# Patient Record
Sex: Male | Born: 1965 | Race: Black or African American | Hispanic: No | Marital: Married | Smoking: Former smoker
Health system: Southern US, Community
[De-identification: ages and names within clinical notes are randomized; demographics above are authoritative.]

## PROBLEM LIST (undated history)

## (undated) DIAGNOSIS — T8859XA Other complications of anesthesia, initial encounter: Secondary | ICD-10-CM

## (undated) DIAGNOSIS — N179 Acute kidney failure, unspecified: Secondary | ICD-10-CM

## (undated) DIAGNOSIS — I2510 Atherosclerotic heart disease of native coronary artery without angina pectoris: Secondary | ICD-10-CM

## (undated) DIAGNOSIS — T8189XA Other complications of procedures, not elsewhere classified, initial encounter: Secondary | ICD-10-CM

## (undated) DIAGNOSIS — I10 Essential (primary) hypertension: Secondary | ICD-10-CM

## (undated) DIAGNOSIS — E162 Hypoglycemia, unspecified: Secondary | ICD-10-CM

## (undated) DIAGNOSIS — K219 Gastro-esophageal reflux disease without esophagitis: Secondary | ICD-10-CM

## (undated) DIAGNOSIS — E039 Hypothyroidism, unspecified: Secondary | ICD-10-CM

## (undated) DIAGNOSIS — F419 Anxiety disorder, unspecified: Secondary | ICD-10-CM

## (undated) DIAGNOSIS — R300 Dysuria: Secondary | ICD-10-CM

## (undated) DIAGNOSIS — F32A Depression, unspecified: Secondary | ICD-10-CM

## (undated) DIAGNOSIS — D649 Anemia, unspecified: Secondary | ICD-10-CM

## (undated) DIAGNOSIS — Z9221 Personal history of antineoplastic chemotherapy: Secondary | ICD-10-CM

## (undated) DIAGNOSIS — Z931 Gastrostomy status: Secondary | ICD-10-CM

## (undated) DIAGNOSIS — R509 Fever, unspecified: Secondary | ICD-10-CM

## (undated) DIAGNOSIS — G8929 Other chronic pain: Secondary | ICD-10-CM

## (undated) DIAGNOSIS — R600 Localized edema: Secondary | ICD-10-CM

## (undated) DIAGNOSIS — E46 Unspecified protein-calorie malnutrition: Secondary | ICD-10-CM

## (undated) DIAGNOSIS — S060XAA Concussion with loss of consciousness status unknown, initial encounter: Secondary | ICD-10-CM

## (undated) DIAGNOSIS — R112 Nausea with vomiting, unspecified: Secondary | ICD-10-CM

## (undated) DIAGNOSIS — Z923 Personal history of irradiation: Secondary | ICD-10-CM

## (undated) DIAGNOSIS — G4700 Insomnia, unspecified: Secondary | ICD-10-CM

## (undated) DIAGNOSIS — K5900 Constipation, unspecified: Secondary | ICD-10-CM

## (undated) DIAGNOSIS — J392 Other diseases of pharynx: Secondary | ICD-10-CM

## (undated) DIAGNOSIS — C099 Malignant neoplasm of tonsil, unspecified: Secondary | ICD-10-CM

## (undated) DIAGNOSIS — R945 Abnormal results of liver function studies: Secondary | ICD-10-CM

## (undated) DIAGNOSIS — S060X9A Concussion with loss of consciousness of unspecified duration, initial encounter: Secondary | ICD-10-CM

## (undated) DIAGNOSIS — T4145XA Adverse effect of unspecified anesthetic, initial encounter: Secondary | ICD-10-CM

## (undated) DIAGNOSIS — M1990 Unspecified osteoarthritis, unspecified site: Secondary | ICD-10-CM

## (undated) DIAGNOSIS — J019 Acute sinusitis, unspecified: Secondary | ICD-10-CM

## (undated) DIAGNOSIS — R7989 Other specified abnormal findings of blood chemistry: Secondary | ICD-10-CM

## (undated) DIAGNOSIS — E876 Hypokalemia: Secondary | ICD-10-CM

## (undated) DIAGNOSIS — M25569 Pain in unspecified knee: Secondary | ICD-10-CM

## (undated) HISTORY — DX: Insomnia, unspecified: G47.00

## (undated) HISTORY — DX: Acute sinusitis, unspecified: J01.90

## (undated) HISTORY — DX: Other complications of procedures, not elsewhere classified, initial encounter: T81.89XA

## (undated) HISTORY — DX: Dysuria: R30.0

---

## 2013-02-18 ENCOUNTER — Encounter (HOSPITAL_COMMUNITY): Payer: Self-pay | Admitting: Emergency Medicine

## 2013-02-18 ENCOUNTER — Emergency Department (INDEPENDENT_AMBULATORY_CARE_PROVIDER_SITE_OTHER)
Admission: EM | Admit: 2013-02-18 | Discharge: 2013-02-18 | Disposition: A | Discharge status: Home / Self Care | Payer: Medicaid Other | Source: Home / Self Care | Attending: Family Medicine | Admitting: Family Medicine

## 2013-02-18 DIAGNOSIS — S83206A Unspecified tear of unspecified meniscus, current injury, right knee, initial encounter: Secondary | ICD-10-CM

## 2013-02-18 DIAGNOSIS — IMO0002 Reserved for concepts with insufficient information to code with codable children: Secondary | ICD-10-CM

## 2013-02-18 MED ORDER — METHYLPREDNISOLONE ACETATE 40 MG/ML IJ SUSP
INTRAMUSCULAR | Status: AC
  Filled 2013-02-18: qty 5

## 2013-02-18 MED ORDER — METHYLPREDNISOLONE ACETATE 40 MG/ML IJ SUSP
40.0000 mg | Freq: Once | INTRAMUSCULAR | Status: AC
  Administered 2013-02-18: 40 mg via INTRA_ARTICULAR

## 2013-02-18 NOTE — ED Provider Notes (Signed)
CSN: [id number]     Arrival date & time 02/18/13  1250 History   First MD Initiated Contact with Patient 02/18/13 1400     Chief Complaint  Patient presents with  . Knee Pain   (Consider location/radiation/quality/duration/timing/severity/associated sxs/prior Treatment) HPI Comments: Pt was playing football when R knee twisted. Has been causing problems for pt on and off for 2.5 years, worsening. Now hurts all the time.   Patient is a 47 y.o. male presenting with knee pain. The history is provided by the patient.  Knee Pain Location:  Knee Time since incident: 2.5 years. Injury: yes   Knee location:  R knee Pain details:    Radiates to:  Does not radiate   Severity:  Severe   Onset quality:  Sudden   Duration: 2.5 years.   Timing:  Intermittent   Progression:  Worsening Chronicity:  Chronic Dislocation: no   Relieved by:  Nothing Worsened by:  Bearing weight Ineffective treatments:  NSAIDs Associated symptoms: stiffness and swelling   Associated symptoms: no fever     History reviewed. No pertinent past medical history. History reviewed. No pertinent past surgical history. History reviewed. No pertinent family history. History  Substance Use Topics  . Smoking status: Never Smoker   . Smokeless tobacco: Not on file  . Alcohol Use: No    Review of Systems  Constitutional: Negative for fever and chills.  Musculoskeletal: Positive for joint swelling and stiffness.       R knee pain    Allergies  Review of patient's allergies indicates no known allergies.  Home Medications  No current outpatient prescriptions on file. BP 139/85  Pulse 83  Temp(Src) 97.8 F (36.6 C) (Oral)  Resp 16  SpO2 99% Physical Exam  Constitutional: He appears well-developed and well-nourished. No distress.  Musculoskeletal:       Right knee: He exhibits swelling and abnormal meniscus. He exhibits normal range of motion. Tenderness found. Lateral joint line tenderness noted.    ED  Course  Injection of joint Date/Time: 02/18/2013 1:45 PM Performed by: Clementeen Graham, S Authorized by: Clementeen Graham, S Consent: Verbal consent obtained. Risks and benefits: risks, benefits and alternatives were discussed Consent given by: patient Patient understanding: patient states understanding of the procedure being performed Patient identity confirmed: verbally with patient and arm band Local anesthesia used: yes Local anesthetic: bupivacaine 0.5% without epinephrine Anesthetic total: 2 ml Patient sedated: no Comments: R knee injected with 40mg  depo-medrol and 2ml bupivicaine. Pt tolerated well.    (including critical care time) Labs Review Labs Reviewed - No data to display Imaging Review No results found.  EKG Interpretation     Ventricular Rate:    PR Interval:    QRS Duration:   QT Interval:    QTC Calculation:   R Axis:     Text Interpretation:              MDM   1. Meniscus tear, right, initial encounter   Pt examined by Dr. Denyse Amass, positive McMurray's test. Joint injected by Dr. Denyse Amass.  [facility] to [facility]. Encouarged to get orange card [facility] network) and sign up for health insurance.     Cathlyn Parsons, NP 02/18/13 1408

## 2013-02-18 NOTE — ED Notes (Signed)
Reported pain in right knee sine 9-1; had problems since HS football; denies new injury, but has been on his feet a lot recently

## 2013-02-18 NOTE — ED Provider Notes (Signed)
Knee injection. Right Consent obtained and timeout performed. Patient seated in a comfortable position with legs hanging off the table.  The medial Peri-patellar tendon space was palpated and marked. The skin was then cleaned with Alcohol. Cold spray applied. A 25-gauge inch and a half needle was used to inject 40 mg of Depo-Medrol and 4 mL of 0.5% Marcaine. Patient tolerated procedure well no bleeding. Pain improved following injection   Medical screening examination/treatment/procedure(s) were performed by a resident physician or non-physician practitioner and as the supervising physician I was immediately available for consultation/collaboration.  Clementeen Graham, MD   Rodolph Bong, MD 02/18/13 [phone]

## 2013-05-14 ENCOUNTER — Ambulatory Visit: Payer: Medicaid Other | Attending: Internal Medicine

## 2013-06-10 ENCOUNTER — Ambulatory Visit: Payer: Medicaid Other | Attending: Internal Medicine

## 2013-07-09 ENCOUNTER — Other Ambulatory Visit: Payer: Self-pay | Admitting: Otolaryngology

## 2013-07-09 ENCOUNTER — Other Ambulatory Visit (HOSPITAL_COMMUNITY)
Admission: RE | Admit: 2013-07-09 | Discharge: 2013-07-09 | Disposition: A | Discharge status: Home / Self Care | Payer: Medicaid Other | Source: Ambulatory Visit | Attending: Otolaryngology | Admitting: Otolaryngology

## 2013-07-09 ENCOUNTER — Encounter (HOSPITAL_COMMUNITY): Payer: Self-pay | Admitting: Emergency Medicine

## 2013-07-09 ENCOUNTER — Emergency Department (INDEPENDENT_AMBULATORY_CARE_PROVIDER_SITE_OTHER)
Admission: EM | Admit: 2013-07-09 | Discharge: 2013-07-09 | Disposition: A | Discharge status: Home / Self Care | Payer: Medicaid Other | Source: Home / Self Care | Attending: Emergency Medicine | Admitting: Emergency Medicine

## 2013-07-09 DIAGNOSIS — R221 Localized swelling, mass and lump, neck: Principal | ICD-10-CM

## 2013-07-09 DIAGNOSIS — M25569 Pain in unspecified knee: Secondary | ICD-10-CM

## 2013-07-09 DIAGNOSIS — R220 Localized swelling, mass and lump, head: Secondary | ICD-10-CM

## 2013-07-09 DIAGNOSIS — C099 Malignant neoplasm of tonsil, unspecified: Secondary | ICD-10-CM

## 2013-07-09 DIAGNOSIS — J392 Other diseases of pharynx: Secondary | ICD-10-CM

## 2013-07-09 HISTORY — DX: Malignant neoplasm of tonsil, unspecified: C09.9

## 2013-07-09 MED ORDER — MELOXICAM 15 MG PO TABS: 15.0000 mg | ORAL_TABLET | Freq: Every day | ORAL | Status: DC

## 2013-07-09 MED ORDER — HYDROCODONE-ACETAMINOPHEN 5-325 MG PO TABS: ORAL_TABLET | ORAL | Status: DC

## 2013-07-09 NOTE — ED Provider Notes (Signed)
Chief Complaint   Chief Complaint  Patient presents with  . Sore Throat  . Knee Pain    History of Present Illness   Mason [name] is a 48 year old male who presents today with knee pain and sore throat.  1. Knee pain: This is been worse for the past month, however he's had pain in that knee for over a year. He thinks he injured her knee playing basketball. He was seen here last November. He was told he had a medial collateral ligament tear. He's been out of work because of the knee pain. He was given a cortisone shot when he was here and this didn't help at all. The pain is located laterally. He has crepitus and giving way of the knee. No locking.  2. Sore throat: He has had a sore throat for the past 5 weeks. He went to an emergency room in [place] about 10 days ago and was diagnosed with tonsillitis. He was prescribed Augmentin and prednisone but is not feeling any better. It feels like his throat is closing up. He's had subjective fever, chills, nasal congestion, headache, and cough.  Review of Systems   Other than as noted above, the patient denies any of the following symptoms: Systemic:  No fevers or chills. Musculoskeletal:  No arthritis, swelling, back pain, or neck pain.  Neurological:  No muscular weakness or paresthesias.  [place]   Past medical history, family history, social history, meds, and allergies were reviewed.     Physical Examination   Vital signs:  BP 158/103  Pulse 84  Temp(Src) 98.3 F (36.8 C) (Oral)  Resp 16  SpO2 96% Gen:  Alert and oriented times 3.  In no distress. ENT: TMs are normal. Exam of the throat reveals a large posterior pharyngeal, fungating mass with white exudate. This is definitely not tonsillitis I'm concerned about carcinoma or lymphoma. There was no cervical lymphadenopathy. Lungs: Clear to auscultation. Heart: Regular rhythm, no gallop or murmur. Musculoskeletal:  There was no fluid or swelling present. The knee was tender  to palpation both medially and laterally. The knee has a limited range of motion with crepitus and pain.  McMurray's test was negative.  Lachman's test was negative.  Anterior drawer test was negative.   Varus and valgus stress were both painful.  Otherwise, all joints had a full a ROM with no swelling, bruising or deformity.  No edema, pulses full. Extremities were warm and pink.  Capillary refill was brisk.  Skin:  Clear, warm and dry.  No rash. Neuro:  Alert and oriented times 3.  Muscle strength was normal.  Sensation was intact to light touch.   Assessment   The primary encounter diagnosis was Throat mass. A diagnosis of Knee pain was also pertinent to this visit.  His biggest concern right now should be the sore throat and mass in the throat. I called Dr. Alroy Dust for and he will see him immediately. The patient was sent right over to his office to followup there. Once the throat is taking care of he'll need to address the knee. He was given the name of Dr. Fredonia Highland to followup with for this. He will probably need an MRI of the knee followed by arthroscopic surgery.  Plan    1.  Meds:  The following meds were prescribed:   Discharge Medication List as of 07/09/2013 12:47 PM    START taking these medications   Details  HYDROcodone-acetaminophen (NORCO/VICODIN) 5-325 MG per tablet 1 to 2  tabs every 4 to 6 hours as needed for pain., Print    meloxicam (MOBIC) 15 MG tablet Take 1 tablet (15 mg total) by mouth daily., Starting 07/09/2013, Until Discontinued, Normal        2.  Patient Education/Counseling:  The patient was given appropriate handouts, self care instructions, and instructed in symptomatic relief, including rest and activity, elevation, application of ice and compression.    3.  Follow up:  The patient was told to follow up here if no better in 3 to 4 days, or sooner if becoming worse in any way, and given some red flag symptoms such as worsening pain or neurological symptoms  which would prompt immediate return.       Harden Mo, MD 07/09/13 [phone]

## 2013-07-09 NOTE — ED Notes (Signed)
Patient complains of right knee pain and can't walk at times; also states sore throat x 1 month.

## 2013-07-09 NOTE — Discharge Instructions (Signed)
Knee pain can be caused by many conditions:  Osteoarthritis, gout, bursitis, tendonitis, cartiledge damage, condromalacia patella, patellofemoral syndrome, and ligament sprain to name just a few.  Often some simple conservative measures can help alleviate the pain. ° °Do not do the following: °· Avoid squatting and doing deep knee bends.  This puts too much of load on your cartiledges and tendons.  If you do a knee bend, go only half way down, flexing your knee no more than 90 degrees. ° °Do the following: °· If you are overweight or obese, lose weight.  This makes for a lot less load on your knee joints. °· If you use tobacco, quit.  Nicotine causes spasm of the small arteries, decreases blood flow, and impairs your body's normal ability to repair damage. °· If your knee is acutely inflamed, use the principles of RICE (rest, ice, compression, and elevation). °· Wearing a knee brace can help.  These are usually made of neoprene and can be purchased over the counter at the drug store. °· Use of over the counter pain meds can be of help.  Tylenol (or acetaminophen) is the safest to use.  It often helps to take this regularly.  You can take up to 2 325 mg tablets 5 times daily, but it best to start out much lower that that, perhaps 2 325 mg tablets twice daily, then increase from there. People who are on the blood thinner warfarin have to be careful about taking high doses of Tylenol.  For people who are able to tolerate them, ibuprofen and naproxyn can also help with the pain.  You should discuss these agents with your physician before taking them.  People with chronic kidney disease, hypertension, peptic ulcer disease, and reflux can suffer adverse side effects. They should not be taken with warfarin. The maximum dosage of ibuprofen is 800 mg 3 times daily with meals.  The maximum dosage of naprosyn is 2 and 1/2 tablets twice daily with food, but again, start out low and gradually increase the dose until adequate  pain relief is achieved. Ibuprofen and naprosyn should always be taken with food. °· People with cartiledge injury or osteoarthritis may find glucosamine to be helpful.  This is an over-the-counter supplement that helps nourish and repair cartiledge.  The dose is 500 mg 3 times daily or 1500 mg taken in a single dose. This can take several months to work and it doesn't always work.   °· For people with knee pain on just one side, use of a cane held in the hand on the same side as the knee pain takes some of the stress off the knee joint and can make a big difference in knee pain. °· Wearing good shoes with adequate arch support is essential. °· Regular exercise is of utmost importance.  Swimming, water aerobics, or use of an elliptical exerciser put the least stress on the knees of any exercise. °· Finally doing the exercises below can be very helpful.  They tend to strengthen the muscles around the knee and provide extra support and stability.  Try to do them twice a day followed by ice for 10 minutes. ° ° ° ° ° ° °Blood pressure over the ideal can put you at higher risk for stroke, heart disease, and kidney failure.  For this reason, it's important to try to get your blood pressure as close as possible to the ideal. ° °The ideal blood pressure is 120/80.  Blood pressures from 120-139 systolic   over 80-89 diastolic are labeled as "prehypertension."  This means you are at higher risk of developing hypertension in the future.  Blood pressures in this range are not treated with medication, but lifestyle changes are recommended to prevent progression to hypertension.  Blood pressures of 140 and above systolic over 90 and above diastolic are classified as hypertension and are treated with medications. ° °Lifestyle changes which can benefit both prehypertension and hypertension include the following: ° °· Salt and sodium restriction. °· Weight loss. °· Regular exercise. °· Avoidance of tobacco. °· Avoidance of excess  alcohol. °· The "D.A.S.H" diet. ° °· People with hypertension and prehypertension should limit their salt intake to less than 1500 mg daily.  Reading the nutrition information on the label of many prepared foods can give you an idea of how much sodium you're consuming at each meal.  Remember that the most important number on the nutrition information is the serving size.  It may be smaller than you think.  Try to avoid adding extra salt at the table.  You may add small amounts of salt while cooking.  Remember that salt is an acquired taste and you may get used to a using a whole lot less salt than you are using now.  Using less salt lets the food's natural flavors come through.  You might want to consider using salt substitutes, potassium chloride, pepper, or blends of herbs and spices to enhance the flavor of your food.  Foods that contain the most salt include: processed meats (like ham, bacon, lunch meat, sausage, hot dogs, and breakfast meat), chips, pretzels, salted nuts, soups, salty snacks, canned foods, junk food, fast food, restaurant food, mustard, pickles, pizza, popcorn, soy sauce, and worcestershire sauce--quite a list!  You might ask, "Is there anything I can eat?"  The answer is, "yes."  Fruits and vegetables are usually low in salt.  Fresh is better than frozen which is better than canned.  If you have canned vegetables, you can cut down on the salt content by rinsing them in tap water 3 times before cooking.   ° °· Weight loss is the second thing you can do to lower your blood pressure.  Getting to and maintaining ideal weight will often normalize your blood pressure and allow you to avoid medications, entirely, cut way down on your dosage of medications, or allow to wean off your meds.  (Note, this should only be done under the supervision of your primary care doctor.)  Of course, weight loss takes time and you may need to be on medication in the meantime.  You shoot for a body mass index of 20-25.   When you go to the urgent care or to your primary care doctor, they should calculate your BMI.  If you don't know what it is, ask.  You can calculate your BMI with the following formula:  Weight in pounds x 703/ (height in inches) x (height in inches).  There are many good diets out there: Weight Watchers and the D.A.S.H. Diet are the best, but often, just modifying a few factors can be helpful:  Don't skip meals, don't eat out, and keeping a food diary.  I do not recommend fad diets or diet pills which often raise blood pressure.  ° °· Everyone should get regular exercise, but this is particularly important for people with high blood pressure.  Just about any exercise is good.  The only exercise which may be harmful is lifting extreme heavy weights.    I recommend moderate exercise such as walking for 30 minutes 5 days a week.  Going to the gym for a 50 minute workout 3 times a week is also good.  This amounts to 150 minutes of exercise weekly. ° °· Anyone with high blood pressure should avoid any use of tobacco.  Tobacco use does not elevate blood pressure, but it increases the risk of heart disease and stroke.  If you are interested in quitting, discuss with your doctor how to quit.  If you are not interested in quitting, ask yourself, "What would my life be like in 10 years if I continue to smoke?"  "How will I know when it is time to quit?"  "How would my life be better if I were to quit." ° °· Excess alcohol intake can raise the blood pressure.  The safe alcohol intake is 2 drinks or less per day for men and 1 drink per day or less for women. ° °· There is a very good diet which I recommend that has been designed for people with blood pressure called the D.A.S.H. Diet (dietary approaches to stop hypertension).  It consists of fruits, vegetables, lean meats, low fat dairy, whole grains, nuts and seeds.  It is very low in salt and sodium.  It has also been found to have other beneficial health effects such as  lowering cholesterol and helping lose weight.  It has been developed by the National Institutes of Health and can be downloaded from the internet without any cost. Just do a web search on "D.A.S.H. Diet." or go the NIH website (www.nih.gov).  There are also cookbooks and diet plans that can be gotten from [employer] to help you with this diet. ° ° °

## 2013-07-15 ENCOUNTER — Telehealth: Payer: Self-pay | Admitting: Hematology and Oncology

## 2013-07-15 ENCOUNTER — Telehealth: Payer: Self-pay | Admitting: *Deleted

## 2013-07-15 ENCOUNTER — Encounter: Payer: Self-pay | Admitting: Radiation Oncology

## 2013-07-15 NOTE — Telephone Encounter (Signed)
C/D 07/15/13 for appt. 07/16/13 °

## 2013-07-15 NOTE — Telephone Encounter (Signed)
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 He verbalized understanding and appreciation for my call.  Gayleen Orem, RN, BSN, [facility] Head [facility] Neck Oncology Navigator [phone]

## 2013-07-15 NOTE — Progress Notes (Signed)
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 Upon assessment Dr. [name] noted a "fungating ,irregular mass emanating from the right tonsil and growing up onto the right soft palate/uvula and noted palpable adenopathy in the right Level II and Level III neck areas.       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  No definite areas of invasive disease are identified.  Nutrition Status:  Weight changes: no  Swallowing status: Sore Throat  Plans, if any, for PEG tube: no  Tobacco/Marijuana/Snuff/ETOH use: Stopped smoking ~15 years, Social drinking  Past/Anticipated interventions by otolaryngology, if any: Biopsy of right Tonsil  Past/Anticipated interventions by medical oncology, if any: Appointment with Dr. Alvy Bimler on 07/16/13 @ 11:15am  Referrals yet, to any of the following?   Social Work? Yes, put in order today  Dentistry? no  Swallowing therapy? no  Nutrition? no  Med/Onc? Dr. Alvy Bimler today  PEG placement?   SAFETY ISSUES:  Prior radiation? No  Pacemaker/ICD? No  Possible current pregnancy? N/A  Is the patient on methotrexate? No  Current Complaints / other details:  Single, 1 child, paternal grandfather  Unknown cancer and heart attack July 26, 2013 [facility] aptt  With Dr.?

## 2013-07-15 NOTE — Telephone Encounter (Signed)
S/W PATIENT AND GAVE NEW PATIENT APPT FOR 03/31 @ 10:45 W/DR. Ramey.  REFERRING DR. 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

## 2013-07-16 ENCOUNTER — Other Ambulatory Visit: Payer: Self-pay | Admitting: Radiation Oncology

## 2013-07-16 ENCOUNTER — Ambulatory Visit (HOSPITAL_BASED_OUTPATIENT_CLINIC_OR_DEPARTMENT_OTHER): Payer: Medicaid Other | Admitting: Hematology and Oncology

## 2013-07-16 ENCOUNTER — Encounter: Payer: Self-pay | Admitting: Radiation Oncology

## 2013-07-16 ENCOUNTER — Encounter: Payer: Self-pay | Admitting: *Deleted

## 2013-07-16 ENCOUNTER — Ambulatory Visit
Admission: RE | Admit: 2013-07-16 | Discharge: 2013-07-16 | Disposition: A | Discharge status: Home / Self Care | Payer: Medicaid Other | Source: Ambulatory Visit | Attending: Radiation Oncology | Admitting: Radiation Oncology

## 2013-07-16 ENCOUNTER — Encounter: Payer: Self-pay | Admitting: Hematology and Oncology

## 2013-07-16 ENCOUNTER — Ambulatory Visit: Payer: Medicaid Other

## 2013-07-16 VITALS — BP 163/92 | HR 84 | Temp 98.1°F | Resp 20 | Ht 79.0 in | Wt 334.4 lb

## 2013-07-16 VITALS — BP 155/83 | HR 85 | Temp 98.3°F | Resp 20 | Ht 79.0 in | Wt 333.8 lb

## 2013-07-16 DIAGNOSIS — C099 Malignant neoplasm of tonsil, unspecified: Secondary | ICD-10-CM | POA: Diagnosis not present

## 2013-07-16 DIAGNOSIS — Z510 Encounter for antineoplastic radiation therapy: Secondary | ICD-10-CM | POA: Diagnosis not present

## 2013-07-16 DIAGNOSIS — F1020 Alcohol dependence, uncomplicated: Secondary | ICD-10-CM

## 2013-07-16 DIAGNOSIS — C779 Secondary and unspecified malignant neoplasm of lymph node, unspecified: Secondary | ICD-10-CM

## 2013-07-16 DIAGNOSIS — B977 Papillomavirus as the cause of diseases classified elsewhere: Secondary | ICD-10-CM

## 2013-07-16 HISTORY — DX: Anxiety disorder, unspecified: F41.9

## 2013-07-16 HISTORY — DX: Pain in unspecified knee: M25.569

## 2013-07-16 HISTORY — DX: Other chronic pain: G89.29

## 2013-07-16 HISTORY — DX: Unspecified osteoarthritis, unspecified site: M19.90

## 2013-07-16 HISTORY — DX: Malignant neoplasm of tonsil, unspecified: C09.9

## 2013-07-16 NOTE — Progress Notes (Signed)
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 male who presented to [facility] urgent care due to right sore throat and a growth in the right tonsillar region. He was referred to Dr. Simeon Craft. Imaging has not been done as of yet. Flexible laryngoscopy was performed by Dr. Simeon Craft and Dr. Simeon Craft appreciated a clear nasopharynx, but in the pharynx the right tonsil revealed a fungating mass. The remainder of the pharynx and larynx were normal per his note. Biopsy of the right tonsillar mass was performed.   07-09-13 Pathology revealed  The carcinoma cells are strongly positive for p16.  Diagnosis Tonsil, biopsy, mass, right - SQUAMOUS CELL CARCINOMA. - SEE COMMENT. Microscopic Comment The carcinoma appears in situ. No definite areas of invasive disease are identified. Dr Mali Rund has reviewed the case and concurs with this interpretation.   On 07/09/2013 he also underwent an FNA of right level III neck. Pathology revealed NECK, FINE NEEDLE ASPIRATION, RIGHT, LEVEL III (SPECIMEN 1 OF 1, COLLECTED ON 07/09/13): DEGENERATED CYST CONTENTS AND RARE ATYPICAL CELLS.  [facility]. He reports some dysguesia.  He has some dysphagia with solids, but not choking. No respiratory distress.  No weight loss. He gags when brushing his teeth.  He stopped smoking cigarettes 10 years ago, smoked 1ppd x 20-25 years. Has on average 2 beers daily.  Occasional marijuana. He lives in [place] and isn't currently working. He is here with [name], his nephew, who appears very  supportive.  PREVIOUS RADIATION THERAPY: No  PAST MEDICAL HISTORY:  has a past medical history of Tonsillar cancer (07/09/13); Cancer (07/09/13); Knee pain, chronic; Anxiety; and Arthritis.    PAST SURGICAL HISTORY:History reviewed. No pertinent past surgical history.  FAMILY HISTORY: family history includes CVA in an other family member; Diabetes in an other family member; Heart attack in an other family member.  SOCIAL HISTORY:  reports that he quit smoking about 10 years ago. His smoking use included Cigarettes. He has a 20 pack-year smoking history. He has never used smokeless tobacco. He reports that he drinks about 7.2 ounces of alcohol per week. He reports that he does not use illicit drugs.  ALLERGIES: Review of patient's allergies indicates no known allergies.  MEDICATIONS:  Current Outpatient Prescriptions  Medication Sig Dispense Refill  . HYDROcodone-acetaminophen (NORCO/VICODIN) 5-325 MG per tablet 1 to 2 tabs every 4 to 6 hours as needed for pain.  20 tablet  0  . meloxicam (MOBIC) 15 MG tablet Take 1 tablet (15 mg total) by mouth daily.  15 tablet  0   No current facility-administered medications for this encounter.    REVIEW OF SYSTEMS:  Notable for that above.   PHYSICAL EXAM:  height is 6\' 7"  (2.007 m) and weight is 334 lb 6.4 oz (151.683 kg). His oral temperature is 98.1 F (36.7 C). His blood pressure is 163/92 and his pulse is 84. His respiration is 20.   General: Alert and oriented, in no acute distress HEENT: Head is normocephalic.  Extraocular movements are intact. Oropharynx - exophytic right tonsillar mass, at least 2cm, crossing midline  Neck: Right Level II cervical  Adenopathy ( node~ 2.5cm) and Right Level III adenopathy (node~ 3cm). Otherwise no appreciated cervical or SCV nodes. Heart: Regular in rate and rhythm with no murmurs, rubs, or gallops. Chest: Clear to auscultation bilaterally, with no rhonchi, wheezes, or rales. Abdomen: Soft, nontender,  nondistended, with no rigidity or guarding. Extremities: No cyanosis or edema. Lymphatics: as above Skin: No concerning lesions. Musculoskeletal: symmetric strength and muscle tone throughout. Neurologic: Cranial nerves II through XII are grossly intact. No obvious focalities. Speech is fluent. Coordination is intact. Psychiatric: Judgment and insight are intact. Affect is appropriate.   ECOG = 1  0 - Asymptomatic (Fully active, able to carry on all predisease activities without restriction)  1 - Symptomatic but completely ambulatory (Restricted in physically strenuous activity but ambulatory and able to carry out work of a light or sedentary nature. For example, light housework, office work)  2 - Symptomatic, <50% in bed during the day (Ambulatory and capable of all self care but unable to carry out any work activities. Up and about more than 50% of waking hours)  3 - Symptomatic, >50% in bed, but not bedbound (Capable of only limited self-care, confined to bed or chair 50% or more of waking hours)  4 - Bedbound (Completely disabled. Cannot carry on any self-care. Totally confined to bed or chair)  5 - Death   Eustace Pen MM, Creech RH, Tormey DC, et al. [phone]). "Toxicity and response criteria of the [facility]". [place] Oncol. 5 (6): 649-55   LABORATORY DATA:  No results found for this basename: WBC,  HGB,  HCT,  MCV,  PLT   CMP  No results found for this basename: na,  k,  cl,  co2,  glucose,  bun,  creatinine,  calcium,  prot,  albumin,  ast,  alt,  alkphos,  bilitot,  gfrnonaa,  gfraa         RADIOGRAPHY: No results found.    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 It is HPV positive, and he has a past history of smoking.  Although biopsies reveal "in situ" disease in the tonsil and atypical cells with cyst contents from the node, this clinically is  demonstrative of likely invasive disease. He is an excellent candidate for radiotherapy. Plan is as below:   1) Consult later today with med/onc to discuss chemotherapy  1a) PET + CT neck with contrast will be ordered ASAP for staging  2) Referral to dentistry for dental evaluation/extractions if needed   3) Will refer to social work for social support   4) Will refer to nutrition for nutrition support   5) Medical Oncology may eventually refer to surgery for PEG tube placement. This is depending on chemotherapy plans; pt seems amenable to undergoing this procedure.  6) Will refer to swallowing therapy for dysphagia prevention   7) Simulation once cleared by dentistry. Anticipate 7 weeks of RT - 70 Gy in 35 fractions.   8) PT referral for pre-RT assessment / neck measurements due to risk of lymphedema in neck; may benefit from PT for this after completion of radiotherapy  9) Other: pending consultation on 07-26-13 at [facility] to discuss surgical options.  I am not sure that surgery will be beneficial to him and anticipate ChRT to be a good definitive treatment instead.  But, if surgery is recommended and he opts to  proceed, he understands that adjuvant RT will likely be indicated.  10) The patient was offered enrollment on our single institutional trial investigating open-faced vs close-face head/shoulder masks used to immobilize patients during head and neck radiotherapy. The patient has elected to enroll on this trial.  In regards to the trial, the patient has voluntarily signed copies of the consent forms and all trial related questions were answered.  11) While he has quit tobacco, I advised him to abstain from marijuana and ETOH as these are carcinogenic and could compromise his tolerance of therapy, chance of cure, and raise his risk of other cancers.  12) Obtain baseline BUN/Cr for PET/CT with contrast and TSH for baseline.  It was a pleasure meeting the patient  today. We discussed the risks, benefits, and side effects of adjuvant radiotherapy. We talked in detail about acute and late effects. He understands that some of the most bothersome acute effects will be significant soreness of the mouth and throat, changes in taste, changes in salivary function, skin irritation, hair loss, dehydration, weight loss and fatigue. We talked about late effects which include but are not necessarily limited to dysphagia, hypothyroidism, nerve injury, spinal cord injury, dry mouth, trismus, and neck edema. No guarantees of treatment were given. A consent form was signed and placed in the patient's medical record. The patient is enthusiastic about proceeding with treatment. I look forward to participating in the patient's care.  I spent 60 minutes  face to face with the patient and more than 50% of that time was spent in counseling and/or coordination of care.    __________________________________________   Eppie Gibson, MD

## 2013-07-16 NOTE — Progress Notes (Signed)
[facility] CONSULT NOTE  Patient Care Team: Angelica Chessman, MD as PCP - General (Internal Medicine) [name] Patient (General Practice) Brooks Sailors, RN as Registered Nurse (Oncology) [name], MD as Referring Physician (Otolaryngology)  CHIEF COMPLAINTS/PURPOSE OF CONSULTATION:  Squamous cell carcinoma of right tonsil with regional lymphadenopathy  HISTORY OF PRESENTING ILLNESS:  Mason [name] 48 y.o. male is here because of newly diagnosed squamous cell carcinoma. According to the patient, the first initial presentation was due to recurrent hemoptysis. He also had sensation of sore throat and palpated a mass in the right neck.  he denies any hearing deficit, difficulties with chewing food, swallowing difficulties, painful swallowing, changes in the quality of his voice or abnormal weight loss. On 07/09/13 , he underwent ENT evaluation and was found to have abnormal right tonsil mass extending to the soft palate and uvula. Pathology from biopsy of right tonsil confirmed diagnosis of squamous cell carcinoma,  HPV Positive  MEDICAL HISTORY:  Past Medical History  Diagnosis Date  . Tonsillar cancer 07/09/13  . Cancer 07/09/13    Tonsil/ right/squamous cell carcinoma  . Knee pain, chronic   . Anxiety     mild new dx  . Arthritis     knees,hips    SURGICAL HISTORY: History reviewed. No pertinent past surgical history.  SOCIAL HISTORY: History   Social History  . Marital Status: Single    Spouse Name: N/A    Number of Children: N/A  . Years of Education: N/A   Occupational History  . Not on file.   Social History Main Topics  . Smoking status: Former Smoker -- 1.00 packs/day for 20 years    Types: Cigarettes    Quit date: 07/16/2003  . Smokeless tobacco: Never Used  . Alcohol Use: 7.2 oz/week    12 Cans of beer per week     Comment: social [place], beer   . Drug Use: No  . Sexual Activity: Not on file   Other Topics Concern  . Not  on file   Social History Narrative  . No narrative on file    FAMILY HISTORY: Family History  Problem Relation Age of Onset  . CVA    . Diabetes    . Heart attack      ALLERGIES:  has No Known Allergies.  MEDICATIONS:  Current Outpatient Prescriptions  Medication Sig Dispense Refill  . HYDROcodone-acetaminophen (NORCO/VICODIN) 5-325 MG per tablet 1 to 2 tabs every 4 to 6 hours as needed for pain.  20 tablet  0  . meloxicam (MOBIC) 15 MG tablet Take 1 tablet (15 mg total) by mouth daily.  15 tablet  0   No current facility-administered medications for this visit.    REVIEW OF SYSTEMS:   Constitutional: Denies fevers, chills or abnormal night sweats Eyes: Denies blurriness of vision, double vision or watery eyes Ears, nose, mouth, throat, and face: Denies mucositis  Respiratory: Denies cough, dyspnea or wheezes Cardiovascular: Denies palpitation, chest discomfort or lower extremity swelling Gastrointestinal:  Denies nausea, heartburn or change in bowel habits Skin: Denies abnormal skin rashes Lymphatics: Denies new lymphadenopathy or easy bruising Neurological:Denies numbness, tingling or new weaknesses Behavioral/Psych: Mood is stable, no new changes  All other systems were reviewed with the patient and are negative.  PHYSICAL EXAMINATION: ECOG PERFORMANCE STATUS: 0 - Asymptomatic  Filed Vitals:   07/16/13 1123  BP: 155/83  Pulse: 85  Temp: 98.3 F (36.8 C)  Resp: 20   Filed Weights  07/16/13 1123  Weight: 333 lb 12.8 oz (151.411 kg)    GENERAL:alert, no distress and comfortable. He is morbidly obese SKIN: skin color, texture, turgor are normal, no rashes or significant lesions EYES: normal, conjunctiva are pink and non-injected, sclera clear OROPHARYNX:abnormal lesion is seen on the right tonsillar surface. Tongue appears normal. NECK: supple, thyroid normal size, non-tender, without nodularity LYMPH: large palpable lymphadenopathy in the right level Il,  none at the axillary or inguinal regions LUNGS: clear to auscultation and percussion with normal breathing effort HEART: regular rate & rhythm and no murmurs and no lower extremity edema ABDOMEN:abdomen soft, non-tender and normal bowel sounds Musculoskeletal:no cyanosis of digits and no clubbing  PSYCH: alert & oriented x 3 with fluent speech NEURO: no focal motor/sensory deficits  ASSESSMENT:  Newly diagnosed squamous cell carcinoma of the Head & Neck, HPV Positive  PLAN:  #1 Squamous cell carcinoma of right tonsil with lymphadenopathy Stage of the disease is to be determined, a PET/CT scan has been ordered.   Prognosis would depend on the results for the PET/CT scan, to be discussed and reviewed in the next visit.   Treatment options would include chemotherapy only, radiation only or chemotherapy in combination with radiation therapy.   He has an appointment to see ENT surgeons at [facility] I will hold off ordering further tests until PET/CT result is available #2 Alcoholism I gave him advice to stay abstinent from alcoholism All questions were answered. The patient knows to call the clinic with any problems, questions or concerns. I spent 55 minutes counseling the patient face to face. The total time spent in the appointment was 60 minutes and more than 50% was on counseling.     [facility], [place], MD 07/16/2013 7:19 PM

## 2013-07-16 NOTE — Progress Notes (Signed)
Please see the Nurse Progress Note in the MD Initial Consult Encounter for this patient. 

## 2013-07-16 NOTE — Progress Notes (Signed)
Checked in new pt with no ins.  Pt is approved for 75% discount thru the hospital.

## 2013-07-17 ENCOUNTER — Telehealth: Payer: Self-pay | Admitting: *Deleted

## 2013-07-17 NOTE — Telephone Encounter (Signed)
Called patient to inform of appts., lvm for a return call 

## 2013-07-18 ENCOUNTER — Telehealth: Payer: Self-pay | Admitting: *Deleted

## 2013-07-18 NOTE — Telephone Encounter (Signed)
Called patient to inform of speech therapy appt. On 07-29-13- arrival time - 1:45 p.m., spoke with patient and he is aware of this appt.

## 2013-07-19 ENCOUNTER — Encounter (HOSPITAL_COMMUNITY): Payer: Self-pay | Admitting: Dentistry

## 2013-07-19 ENCOUNTER — Telehealth: Payer: Self-pay | Admitting: *Deleted

## 2013-07-19 ENCOUNTER — Ambulatory Visit (HOSPITAL_COMMUNITY): Payer: Medicaid - Dental | Admitting: Dentistry

## 2013-07-19 VITALS — BP 144/88 | HR 85 | Temp 97.6°F

## 2013-07-19 DIAGNOSIS — M270 Developmental disorders of jaws: Secondary | ICD-10-CM

## 2013-07-19 DIAGNOSIS — K0530 Chronic periodontitis, unspecified: Secondary | ICD-10-CM

## 2013-07-19 DIAGNOSIS — M264 Malocclusion, unspecified: Secondary | ICD-10-CM

## 2013-07-19 DIAGNOSIS — K089 Disorder of teeth and supporting structures, unspecified: Secondary | ICD-10-CM

## 2013-07-19 DIAGNOSIS — C099 Malignant neoplasm of tonsil, unspecified: Secondary | ICD-10-CM

## 2013-07-19 DIAGNOSIS — K029 Dental caries, unspecified: Secondary | ICD-10-CM

## 2013-07-19 DIAGNOSIS — K0889 Other specified disorders of teeth and supporting structures: Secondary | ICD-10-CM

## 2013-07-19 DIAGNOSIS — Z0189 Encounter for other specified special examinations: Secondary | ICD-10-CM

## 2013-07-19 DIAGNOSIS — K08409 Partial loss of teeth, unspecified cause, unspecified class: Secondary | ICD-10-CM

## 2013-07-19 DIAGNOSIS — K036 Deposits [accretions] on teeth: Secondary | ICD-10-CM

## 2013-07-19 DIAGNOSIS — IMO0002 Reserved for concepts with insufficient information to code with codable children: Secondary | ICD-10-CM

## 2013-07-19 NOTE — Progress Notes (Addendum)
DENTAL CONSULTATION  Date of Consultation:  07/19/2013 Patient Name:   Mason [name] Date of Birth:   1965/12/02 Medical Record Number: [id number]  VITALS: BP 144/88  Pulse 85  Temp(Src) 97.6 F (36.4 C) (Oral)   CHIEF COMPLAINT: Patient was referred for a pre-chemoradiation therapy dental protocol examination.  HPI: [name] is a 48 year old male recently diagnosed with squamous cell carcinoma of the right tonsil. Patient with anticipated chemoradiation therapy. Patient now seen as part of a medically necessary pre-chemoradiation therapy dental protocol examination.  The patient currently denies any acute toothache, swellings, or abscesses. The patient was last seen for extraction approximately one year ago. The patient denies any complications with that dental extraction. Patient had tooth extracted while in [place]. Prior to that, the patient had not seen a dentist for " a long time".  Patient denies having any partial dentures.    PROBLEM LIST: Patient Active Problem List   Diagnosis Date Noted  . Tonsillar cancer 07/16/2013    PMH: Past Medical History  Diagnosis Date  . 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 Knee pain, chronic   . Anxiety     mild new dx  . Arthritis     knees,hips    PSH: History reviewed. No pertinent past surgical history.  ALLERGIES: No Known Allergies  MEDICATIONS: Current Outpatient Prescriptions  Medication Sig Dispense Refill  . HYDROcodone-acetaminophen (NORCO/VICODIN) 5-325 MG per tablet 1 to 2 tabs every 4 to 6 hours as needed for pain.  20 tablet  0  . meloxicam (MOBIC) 15 MG tablet Take 1 tablet (15 mg total) by mouth daily.  15 tablet  0   No current facility-administered medications for this visit.    LABS: No results found for this basename: WBC, HGB, HCT, MCV, PLT   No results found for this basename: na, k, cl, co2, glucose, bun, creatinine, calcium, gfrnonaa, gfraa   No results found for this  basename: INR, PROTIME   No results found for this basename: PTT    SOCIAL HISTORY: History   Social History  . Marital Status: Single    Spouse Name: N/A    Number of Children: N/A  . Years of Education: N/A   Occupational History  . Not on file.   Social History Main Topics  . Smoking status: Former Smoker -- 1.00 packs/day for 20 years    Types: Cigarettes    Quit date: 07/16/2003  . Smokeless tobacco: Never Used  . Alcohol Use: 7.2 oz/week    12 Cans of beer per week     Comment: social [place], beer   . Drug Use: No  . Sexual Activity: Not on file   Other Topics Concern  . Not on file   Social History Narrative  . No narrative on file    FAMILY HISTORY: Family History  Problem Relation Age of Onset  . CVA    . Diabetes    . Heart attack    . Arthritis Father   . Arthritis Mother     REVIEW OF SYSTEMS: Reviewed with patient and included in dental record.   DENTAL HISTORY: CHIEF COMPLAINT: Patient was referred for a pre-chemoradiation therapy dental protocol examination.  HPI: Mason [name] is a 48 year old male recently diagnosed with squamous cell carcinoma of the right tonsil. Patient with anticipated chemoradiation therapy. Patient now seen as part of a medically necessary pre-chemoradiation therapy dental protocol examination.  The patient currently denies any acute  toothache, swellings, or abscesses. The patient was last seen for extraction approximately one year ago. The patient denies any complications with that dental extraction. Patient had tooth extracted while in [place]. Prior to that, the patient had not seen a dentist for " a long time".  Patient denies having any partial dentures.   DENTAL EXAMINATION:  GENERAL: The patient is a tall, well-developed, well-nourished male in no acute distress. HEAD AND NECK: The patient has right neck lymphadenopathy with no obvious left neck lymphadenopathy. The patient denies acute TMJ  symptoms. INTRAORAL EXAM: The patient has normal saliva. I do not see any evidence of abscess formation. The patient has bilateral mandibular lingual tori. The patient has a palpable nodule involving the right lateral tongue in the area of the premolar molar area that measures approximately 8 mm round. This is nontender to palpation. Unsure of clinical significance. DENTITION: The patient is missing tooth numbers 2, 3, 12, 14, 16, 18, 19, 29, 30. Multiple diastemas are noted. PERIODONTAL: Patient has chronic periodontitis with plaque and calculus accumulations, gingival recession, and incipient to moderate bone loss. DENTAL CARIES/SUBOPTIMAL RESTORATIONS: Dental caries on distal #31 is noted. Multiple flexure lesions are noted. ENDODONTIC: Patient currently denies acute pulpitis symptoms. I do not see any evidence of periapical pathology. CROWN AND BRIDGE: There are no crown restorations. PROSTHODONTIC: There are no partial dentures. OCCLUSION: Patient has a poor occlusal scheme secondary to multiple missing teeth, multiple diastemas, supra-eruption and drifting of the unopposed teeth into the edentulous areas, and lack of replacement of missing teeth with dental prostheses.  RADIOGRAPHIC INTERPRETATION: An orthopantogram was taken on 07/19/2013 and supplemented with a full series of dental radiographs. There are multiple missing teeth. There are multiple diastemas noted. There is supra-eruption and drifting of the unopposed teeth into the edentulous areas. There is incipient to moderate bone loss noted. A poor occlusal scheme is noted. Dental caries #31 is noted.   ASSESSMENTS: 1. Squamous cell carcinoma right tonsil 2. Pre-chemoradiation therapy dental protocol examination 3. Chronic periodontitis with bone loss 4. Gingival recession 5. Accretions 6. Multiple missing teeth 7. Multiple diastemas 8. Supra-eruption and drifting of the unopposed teeth into the edentulous areas 9. Poor occlusal  scheme and malocclusion 10. No history of partial dentures. 11. Dental caries 12. Bilateral mandibular lingual tori 13. Incipient mandibular and maxillary anterior tooth mobility 14.   Right lateral tongue palpable nodule in the area of the premolar molar. This is nontender to palpation. Unsure of clinical significance.  PLAN/RECOMMENDATIONS: 1. I discussed the risks, benefits, and complications of various treatment options with the patient in relationship to his medical and dental conditions, possible TORS at [facility], possible chemoradiation therapy, and chemoradiation therapy side effects to include xerostomia, radiation caries, trismus, mucositis, taste changes, gum and jawbone changes, and risk for infection, bleeding, and osteoradionecrosis. We discussed various treatment options to include no treatment, multiple extractions with alveoloplasty, pre-prosthetic surgery as indicated, periodontal therapy, dental restorations, root canal therapy, crown and bridge therapy, implant therapy, and replacement of missing teeth as indicated. The patient currently is scheduled for a PET scan later next week along with consultation at [facility] for possible transoral robotic surgery.   If the patient needs radiation therapy, he will also require extraction of tooth numbers 1, 15, 17, 31, and 32 with alveoloplasty along with gross debridement of remaining dentition. The patient did agree to proceed with extractions if radiation therapy is needed. Patient also agreed to impressions today for the fabrication of  possible fluoride trays and scatter protection devices. Patient will contact dental medicine once the decision has been made concerning the need for radiation therapy.   2. Discussion of findings with medical team and coordination of future medical and dental care as needed.  I spent 75 minutes face to face with patient and more than 50% of time was spent in counseling and /or  coordination of care.   [name], DDS

## 2013-07-19 NOTE — Patient Instructions (Signed)
RADIATION THERAPY AND DECISIONS REGARDING YOUR TEETH  Xerostomia (dry mouth) Your salivary glands may be in the filed of radiation.  Radiation may include all or part of your saliva glands.  This will cause your saliva to dry up and you will have a dry mouth.  The dry mouth will be for the rest of your life unless your radiation oncologist tells you otherwise.  Your saliva has many functions:  Saliva wets your tongue for speaking.  It coats your teeth and the inside of your mouth for easier movement.  It helps with chewing and swallowing food.  It helps clean away harmful acid and toxic products made by the germs in your mouth, therefore it helps prevent cavities.  It kills some germs in your mouth and helps to prevent gum disease.  It helps to carry flavor to your taste buds.  Once you have lost your saliva you will be at higher risk for tooth decay and gum disease.  What can be done to help improve your mouth when there's not enough saliva:  1.  Your dentist may give a prescription for Salagen.  It will not bring back all of your saliva but may bring back some of it.  Also your saliva may be thick and ropy or white and foamy. It will not feel like it use to feel.  2.  You will need to swish with water every time your mouth feels dry.  YOU CANNOT suck on any cough drops, mints, lemon drops, candy, vitamin C or any other products.  You cannot use anything other than water to make your mouth feel less dry.  If you want to drink anything else you have to drink it all at once and brush afterwards.  Be sure to discuss the details of your diet habits with your dentist or hygienist.  Radiation caries: This is decay that happens very quickly once your mouth is very dry due to radiation therapy.  Normally cavities take six months to two years to become a problem.  When you have dry mouth cavities may take as little as eight weeks to cause you a problem.  This is why dental check ups every two  months are necessary as long as you have a dry mouth. Radiation caries typically, but not always, start at your gum line where it is hard to see the cavity.  It is therefore also hard to fill these cavities adequately.  This high rate of cavities happens because your mouth no longer has saliva and therefore the acid made by the germs starts the decay process.  Whenever you eat anything the germs in your mouth change the food into acid.  The acid then burns a small hole in your tooth.  This small hole is the beginning of a cavity.  If this is not treated then it will grow bigger and become a cavity.  The way to avoid this hole getting bigger is to use fluoride every evening as prescribed by your dentist.  You have to make sure that your teeth are very clean before you use the fluoride.  This fluoride in turn will strengthen your teeth and prepare them for another day of fighting acid.  If you develop radiation caries many times the damage is so large that you will have to have all your teeth removed.  This could be a big problem if some of these teeth are in the field of radiation.  Further details of why this could be   a big problem will follow.  (See Osteoradionecrosis).  Loss of taste (dysgeusia) This happens to varying degrees once you've had radiation therapy to your jaw region.  Many times taste is not completely lost but becomes limited.  The loss of taste is mostly due to radiation affecting your taste buds.  However if you have no saliva in your mouth to carry the flavor to your taste buds it would be difficult for your taste buds to taste anything.  That is why using water or a prescription for Salagen prior to meals and during meals may help with some of the taste.  Keep in mind that taste generally returns very slowly over the course of several months or several years after radiation therapy.  Don't give up hope.  Trismus According to your Radiation Oncologist your TMJ or jaw joints are going to be  partially or fully in the field of radiation.  This means that over time the muscles that help you open and close your mouth may get stiff.  This will potentially result in your not being able to open your mouth wide enough or as wide as you can open it now.  Le me give you an example of how slowly this happens and how unaware people are of it.  A gentlemen that had radiation therapy two years ago came back to me complaining that bananas are just too large for him to be able to fit them in between his teeth.  He was not able to open wide enough to bite into a banana.  This happens slowly and over a period of time.  What do we do to try and prevent this?  Your dentist will probably give you a stack of sticks called a trismus exercise device .  This stack will help your remind your muscles and your jaw joint to open up to the same distance every day.  Use these sticks every morning when you wake up according to the instructions given by the dentist.   You must use these sticks for at least one to two years after radiation therapy.  The reason for that is because it happens so slowly and keeps going on for about two years after radiation therapy.  Your hospital dentist will help you monitor your mouth opening and make sure that it's not getting smaller.  Osteoradionecrosis (ORN) This is a condition where your jaw bone after having had radiation therapy becomes very dry.  It has very little blood supply to keep it alive.  If you develop a cavity that turns into an abscess or an infection then the jaw bone does not have enough blood supply to help fight the infection.  At this point it is very likely that the infection could cause the death of your jaw bone.  When you have dead bone it has to be removed.  Therefore you might end up having to have surgery to remove part of your jaw bone, the part of the jaw bone that has been affected.   Healing is also a problem if you are to have surgery in the areas where the bone  has had radiation therapy.  The same reasons apply.  If you have surgery you need more blood supply which is not available.  When blood supply and oxygen are not available again, there is a chance for the bone to die.  Occasionally ORN happens on its own with no obvious reason.  This is quite rare.  We believe that   patients who continue to smoke and/or drink alcohol have a higher chance of having this bone problem.  Therefore once your jaw bone has had radiation therapy if there are any teeth in that area, you should never have them pulled.  You should also never have any surgery on your teeth or gums in that area unless the oral surgeon or Periodontist is aware of your history of radiation. There is some expensive management techniques that might be used to limit your risks.  The risks for ORN either from infection or spontaneous ( or on it's own) are life long.    TRISMUS  Trismus is a condition where the jaw does not allow the mouth to open as wide as it usually does.  This can happen almost suddenly, or in other cases the process is so slow, it is hard to notice it-until it is too far along.  When the jaw joints and/or muscles have been exposed to radiation treatments, the onset of Trismus is very slow.  This is because the muscles are losing their stretching ability over a long period of time, as long as 2 YEARS after the end of radiation.  It is therefore important to exercise these muscles and joints.  TRISMUS EXERCISES   Stack of tongue depressors measuring the same or a little less than the last documented MIO (Maximum Interincisal Opening).  Secure them with a rubber band on both ends.  Place the stack in the patient's mouth, supporting the other end.  Allow 30 seconds for muscle stretching.  Rest for a few seconds.  Repeat 3-5 times  For all radiation patients, this exercise is recommended in the mornings and evenings unless otherwise instructed.  The exercise should be done for  a period of 2 YEARS after the end of radiation.  MIO should be checked routinely on recall dental visits by the general dentist or the hospital dentist.  The patient is advised to report any changes, soreness, or difficulties encountered when doing the exercises.  FLUORIDE TRAYS PATIENT INSTRUCTIONS    Obtain prescription from the pharmacy.  Don't be surprised if it needs to be ordered.  Be sure to let the pharmacy know when you are close to needing a new refill for them to have it ready for you without interruption of Fluoride use.  The best time to use your Fluoride is before bed time.  You must brush your teeth very well and floss before using the Fluoride in order to get the best use out of the Fluoride treatments.  Place 1 drop of Fluoride gel per tooth in the tray.  Place the tray on your lower teeth and your upper teeth.  Make sure the trays are seated all the way.  Remember, they only fit one way on your teeth.  Insert for 5 full minutes.  At the end of the 5 minutes, take the trays out.  SPIT OUT excess. .  Do NOT rinse your mouth!  Do NOT eat or drink after treatments for at least 30 minutes.  This is why the best time for your treatments is before bedtime.  Clean the inside of your Fluoride trays using COLD WATER and a toothbrush.  In order to keep your Trays from discoloring and free from odors, soak them overnight in denture cleaners such as Efferdent.  Do not use bleach or non denture products.  Store the trays in a safe dry place AWAY from any heat until your next treatment.  If anything happens   to your Fluoride trays, or they don't fit as well after any dental work, please let us know as soon as possible. 

## 2013-07-19 NOTE — Telephone Encounter (Signed)
Pt asks if nurse called him,  He missed a call from our number.  Informed pt that I did not call and not sure who did.  Asked if he has a voice mail and he does, so he said he would check the VM and call back if any question.

## 2013-07-19 NOTE — Addendum Note (Signed)
Encounter addended by: Khalifa Knecht Mintz Falecia Vannatter, RN on: 07/19/2013  6:15 PM<BR>     Documentation filed: Charges VN

## 2013-07-23 ENCOUNTER — Encounter: Payer: Self-pay | Admitting: *Deleted

## 2013-07-23 ENCOUNTER — Telehealth: Payer: Self-pay | Admitting: *Deleted

## 2013-07-23 NOTE — Telephone Encounter (Signed)
CALLED PATIENT TO INFORM OF SPEECH THERAPY ON 07-29-13- ARRIVAL TIME- 1:45 PM, LVM FOR A RETURN CALL

## 2013-07-24 ENCOUNTER — Ambulatory Visit
Admission: RE | Admit: 2013-07-24 | Discharge: 2013-07-24 | Disposition: A | Discharge status: Home / Self Care | Payer: Medicaid Other | Source: Ambulatory Visit | Attending: Radiation Oncology | Admitting: Radiation Oncology

## 2013-07-24 DIAGNOSIS — C099 Malignant neoplasm of tonsil, unspecified: Secondary | ICD-10-CM

## 2013-07-24 LAB — TSH CHCC: TSH: 1.304 m(IU)/L (ref 0.320–4.118)

## 2013-07-24 LAB — BUN AND CREATININE (CC13)
BUN: 14.6 mg/dL (ref 7.0–26.0)
Creatinine: 1.4 mg/dL — ABNORMAL HIGH (ref 0.7–1.3)

## 2013-07-24 NOTE — Progress Notes (Signed)
Faxed Dr. Pearlie Oyster 07/16/13 Progress Notes to Mickel Baas [phone]) for Dr. Rondall Allegra, [facility], in preface to patient's referral appt later today.  Gayleen Orem, RN, BSN, [facility] Head & Neck Oncology Navigator [phone]

## 2013-07-25 ENCOUNTER — Ambulatory Visit (HOSPITAL_COMMUNITY)
Admission: RE | Admit: 2013-07-25 | Discharge: 2013-07-25 | Disposition: A | Discharge status: Home / Self Care | Payer: Medicaid Other | Source: Ambulatory Visit | Attending: Radiation Oncology | Admitting: Radiation Oncology

## 2013-07-25 ENCOUNTER — Encounter: Payer: Self-pay | Admitting: *Deleted

## 2013-07-25 ENCOUNTER — Ambulatory Visit: Payer: Medicaid Other | Admitting: Nutrition

## 2013-07-25 ENCOUNTER — Encounter (HOSPITAL_COMMUNITY): Payer: Self-pay

## 2013-07-25 DIAGNOSIS — C099 Malignant neoplasm of tonsil, unspecified: Secondary | ICD-10-CM

## 2013-07-25 DIAGNOSIS — R221 Localized swelling, mass and lump, neck: Secondary | ICD-10-CM

## 2013-07-25 DIAGNOSIS — R220 Localized swelling, mass and lump, head: Secondary | ICD-10-CM | POA: Diagnosis not present

## 2013-07-25 DIAGNOSIS — J029 Acute pharyngitis, unspecified: Secondary | ICD-10-CM | POA: Diagnosis not present

## 2013-07-25 DIAGNOSIS — R042 Hemoptysis: Secondary | ICD-10-CM | POA: Diagnosis not present

## 2013-07-25 LAB — GLUCOSE, CAPILLARY: Glucose-Capillary: 98 mg/dL (ref 70–99)

## 2013-07-25 MED ORDER — IOHEXOL 300 MG/ML  SOLN
100.0000 mL | Freq: Once | INTRAMUSCULAR | Status: AC | PRN
  Administered 2013-07-25: 100 mL via INTRAVENOUS

## 2013-07-25 MED ORDER — FLUDEOXYGLUCOSE F - 18 (FDG) INJECTION
15.2000 | Freq: Once | INTRAVENOUS | Status: AC | PRN
  Administered 2013-07-25: 15.2 via INTRAVENOUS

## 2013-07-25 NOTE — Progress Notes (Signed)
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  He is a patient of Dr. Isidore Moos and Dr. Alvy Bimler.  Past medical history includes chronic knee pain and anxiety.  Medications and labs were reviewed.  Height: 6 feet 7 inches. Weight: 333.8 pounds March 31. Usual body weight: 275 pounds per patient. BMI: 37.59.  Patient reports recent weight gain secondary to job change and decreased physical activity.  He currently has no nutrition side effects or difficulty eating.  He is being worked up for new diagnosis of tonsil cancer.  Treatment plan not decided on yet.  Nutrition diagnosis: Food and nutrition related knowledge deficit related to new diagnosis of tonsil cancer and associated treatments as evidenced by no prior need for Nutrition education.  Intervention: Patient and fiance were educated on smaller, more frequent meals and snacks throughout the day.  Patient was educated to consume higher protein foods.  Healthy diet was recommended however, patient was cautioned against extreme weight loss.  Educated patient on potential side effects depending on treatments.  Provided some fact sheets, along with coupons, and oral nutrition supplement samples.  Questions were answered and teach back method used.  Monitoring, evaluation, goals: Patient will tolerate adequate calories and protein to minimize weight loss throughout treatment.  Next visit: To be scheduled once treatment plan determined.

## 2013-07-25 NOTE — Progress Notes (Signed)
[place] Psychosocial Distress Screening Clinical Social Work  Clinical Social Work was referred by distress screening protocol.  The patient scored a 5 on the Psychosocial Distress Thermometer which indicates moderate distress. Clinical Social Worker met with patient and fiance after nutrition appointment to assess for distress and other psychosocial needs. The patient reports they are unsure of treatment plan at this time due to the need for more tests.  Mr. Mason [name] shares his only concern at this time is insurance coverage.  CSW discussed Medicaid or [employer] healthcare exchange options.  Patient plans to pursue Medicaid with financial advocate and CSW provided patient with contact information for [facility] healthcare navigators.  CSW briefly explained CSW role at [facility].   Clinical Social Worker follow up needed: yes  If yes, follow up plan:  Will follow up with patient as needed, CSW encouraged patient/caregiver to call with any questions or concerns.  ONCBCN DISTRESS SCREENING 07/16/2013  Mark the number that describes how much distress you have been experiencing in the past week 5  Practical problem type Insurance  Emotional problem type Adjusting to illness  Physical Problem type Pain;Sleep/insomnia  Physician notified of physical symptoms Yes  Referral to clinical social work Yes  Referral to financial advocate Yes    Mason [name], MSW, LCSW, OSW-C Clinical Social Worker [place] [phone]

## 2013-07-26 ENCOUNTER — Telehealth: Payer: Self-pay | Admitting: *Deleted

## 2013-07-26 ENCOUNTER — Ambulatory Visit (HOSPITAL_COMMUNITY): Payer: Self-pay

## 2013-07-26 NOTE — Progress Notes (Signed)
Met with patient and his nephew during initial consult with Dr. Isidore Moos.  Further introduced myself as his Navigator, explained my role as a member of the Care Team, provided contact information, encouraged them to contact me with questions/concerns as treatments/procedures begin.  They indicated understanding.    Initiating navigation as L1 patient (new) with this encounter.  Gayleen Orem, RN, BSN, [facility] Head & Neck Oncology Navigator [phone]

## 2013-07-26 NOTE — Telephone Encounter (Addendum)
Spoke with patient s/p his Tuesday consult with Dr. Posey Pronto in [facility].  Patient stated Dr. Posey Pronto indicated that he is not a candidate for surgery, "too much to cut out".  Patient stated he wants to proceed with chemo/RT, "ready to get started".  He stated he has completed paperwork provided during his appt with Financial Counseling, [employer].  I told him I would check with Lenise when he can return to see her.  I mentioned she can discuss with him opportunity for financial assistance with medications.  He expressed appreciation.  I will follow-up with FC on Monday and report back to patient.  Gayleen Orem, RN, BSN, [facility] Head & Neck Oncology Navigator [phone]

## 2013-07-29 ENCOUNTER — Other Ambulatory Visit: Payer: Self-pay | Admitting: Hematology and Oncology

## 2013-07-29 ENCOUNTER — Telehealth: Payer: Self-pay | Admitting: Hematology and Oncology

## 2013-07-29 ENCOUNTER — Encounter: Payer: Self-pay | Admitting: Radiation Oncology

## 2013-07-29 ENCOUNTER — Ambulatory Visit: Payer: Medicaid Other

## 2013-07-29 ENCOUNTER — Other Ambulatory Visit (HOSPITAL_COMMUNITY): Payer: Self-pay | Admitting: Dentistry

## 2013-07-29 ENCOUNTER — Ambulatory Visit: Payer: Medicaid Other | Attending: Radiation Oncology

## 2013-07-29 ENCOUNTER — Ambulatory Visit: Payer: Medicaid Other | Admitting: Physical Therapy

## 2013-07-29 DIAGNOSIS — IMO0001 Reserved for inherently not codable concepts without codable children: Secondary | ICD-10-CM | POA: Diagnosis present

## 2013-07-29 DIAGNOSIS — C099 Malignant neoplasm of tonsil, unspecified: Secondary | ICD-10-CM

## 2013-07-29 DIAGNOSIS — R1310 Dysphagia, unspecified: Secondary | ICD-10-CM | POA: Diagnosis not present

## 2013-07-29 DIAGNOSIS — R591 Generalized enlarged lymph nodes: Secondary | ICD-10-CM

## 2013-07-29 NOTE — Progress Notes (Signed)
Spoke with patient about PET results.  We will discuss him at tumor board and most likely biopsy the lymph node in the axilla to rule out metastatic disease. In the meantime I asked Dr. 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  -----------------------------------  Eppie Gibson, MD

## 2013-07-29 NOTE — Telephone Encounter (Signed)
I spoke with the patient over the phone, expressing concern about lymphadenopathy seen on PET scan last week. I will consult Gen. surgery to place a port and do a excisional lymph node biopsy at the same time. We will review his case at the next tumor board.

## 2013-07-29 NOTE — Telephone Encounter (Signed)
s/w janice @ [facility] and due to pt being unsured he would have to have pymt of 226.00 at initial visit and amount for bx/port could not be determined right now. pt may have a discount card that could be used at [facility] if it is the orange card. s/w pt and per pt he has the orange card that was issued by [facility] and it expires next month. lmonvm for janice with this information from pt and requested appt. waiting on return call from [place]. not other orders per 4/13 pof.

## 2013-07-30 ENCOUNTER — Other Ambulatory Visit: Payer: Self-pay | Admitting: Hematology and Oncology

## 2013-07-30 ENCOUNTER — Telehealth: Payer: Self-pay | Admitting: *Deleted

## 2013-07-30 ENCOUNTER — Encounter (HOSPITAL_COMMUNITY): Payer: Self-pay

## 2013-07-30 ENCOUNTER — Telehealth: Payer: Self-pay | Admitting: Hematology and Oncology

## 2013-07-30 DIAGNOSIS — R590 Localized enlarged lymph nodes: Secondary | ICD-10-CM

## 2013-07-30 NOTE — Telephone Encounter (Signed)
Informed pt that Lymph node biopsy will be done by Interventional Radiology at [facility] and not by surgeon.  Expect a call from Radiology to schedule the biopsy.   He verbalized understanding.  I.R. Also left VM for this nurse informing that Radiologist cannot do Bx and PAC on same day as it is not safe for pt.. Dr. Alvy Bimler notified.

## 2013-07-30 NOTE — Telephone Encounter (Signed)
Message copied by Cathlean Cower on Tue Jul 30, 2013 12:25 PM ------      Message from: [facility], [place]: Tue Jul 30, 2013 11:32 AM      Regarding: RE: Ct biopsy       If they can do both at the same time, great      If not, I will worry about it later      ----- Message -----         From: Cathlean Cower, RN         Sent: 07/30/2013  10:39 AM           To: [name], MD      Subject: RE: Ct biopsy                                            Does pt also need a PAC? Not sure if IR willing to do at same time as biopsy?  Pt does not have insurance, but has a "orange card" that should cover procedures done within [facility] health.               ----- Message -----         From: [name], MD         Sent: 07/30/2013   9:43 AM           To: Eppie Gibson, MD, [name], MD, #      Subject: Ct biopsy                                                [place],            Can you let the patient know that scheduling will be calling him shortly for Ct guided biopsy of the axillary LN. I reviewed with the surgeon and we are going to try Ct biopsy first            Thanks             ------

## 2013-07-30 NOTE — Telephone Encounter (Signed)
central to contact pt re bx. no other orders per 4/14 pof.

## 2013-07-31 ENCOUNTER — Encounter (HOSPITAL_COMMUNITY): Payer: Self-pay

## 2013-07-31 ENCOUNTER — Ambulatory Visit: Payer: Medicaid Other | Admitting: Physical Therapy

## 2013-07-31 ENCOUNTER — Encounter (HOSPITAL_COMMUNITY)
Admission: RE | Admit: 2013-07-31 | Discharge: 2013-07-31 | Disposition: A | Discharge status: Home / Self Care | Payer: Medicaid Other | Source: Ambulatory Visit | Attending: Dentistry | Admitting: Dentistry

## 2013-07-31 DIAGNOSIS — Z01812 Encounter for preprocedural laboratory examination: Secondary | ICD-10-CM | POA: Diagnosis not present

## 2013-07-31 DIAGNOSIS — IMO0001 Reserved for inherently not codable concepts without codable children: Secondary | ICD-10-CM | POA: Diagnosis not present

## 2013-07-31 HISTORY — DX: Concussion with loss of consciousness status unknown, initial encounter: S06.0XAA

## 2013-07-31 HISTORY — DX: Concussion with loss of consciousness of unspecified duration, initial encounter: S06.0X9A

## 2013-07-31 LAB — COMPREHENSIVE METABOLIC PANEL
ALT: 55 U/L — ABNORMAL HIGH (ref 0–53)
AST: 41 U/L — ABNORMAL HIGH (ref 0–37)
Albumin: 4 g/dL (ref 3.5–5.2)
Alkaline Phosphatase: 59 U/L (ref 39–117)
BUN: 16 mg/dL (ref 6–23)
CO2: 23 mEq/L (ref 19–32)
CREATININE: 1.49 mg/dL — AB (ref 0.50–1.35)
Calcium: 9.4 mg/dL (ref 8.4–10.5)
Chloride: 107 mEq/L (ref 96–112)
GFR, EST AFRICAN AMERICAN: 64 mL/min — AB (ref >90)
GFR, EST NON AFRICAN AMERICAN: 55 mL/min — AB (ref >90)
GLUCOSE: 93 mg/dL (ref 70–99)
Potassium: 4.6 mEq/L (ref 3.7–5.3)
Sodium: 143 mEq/L (ref 137–147)
Total Bilirubin: 0.3 mg/dL (ref 0.3–1.2)
Total Protein: 7.4 g/dL (ref 6.0–8.3)

## 2013-07-31 LAB — CBC
HCT: 42.3 % (ref 39.0–52.0)
Hemoglobin: 13.9 g/dL (ref 13.0–17.0)
MCH: 29.8 pg (ref 26.0–34.0)
MCHC: 32.9 g/dL (ref 30.0–36.0)
MCV: 90.6 fL (ref 78.0–100.0)
PLATELETS: 265 10*3/uL (ref 150–400)
RBC: 4.67 MIL/uL (ref 4.22–5.81)
RDW: 13.3 % (ref 11.5–15.5)
WBC: 8.7 10*3/uL (ref 4.0–10.5)

## 2013-07-31 MED ORDER — DEXTROSE 5 % IV SOLN
3.0000 g | Freq: Once | INTRAVENOUS | Status: AC
  Administered 2013-08-01: 3 g via INTRAVENOUS
  Filled 2013-07-31: qty 3000

## 2013-07-31 NOTE — Pre-Procedure Instructions (Signed)
Mason [name]  07/31/2013   Your procedure is scheduled on: Thursday, August 01, 2013 at 7:30 AM  Report to [facility] (use Main Entrance "A'') at 5:30 AM.  Call this number if you have problems the morning of surgery: [phone]   Remember:   Do not eat food or drink liquids after midnight.   Take these medicines the morning of surgery with A SIP OF WATER:    Do not wear jewelry, make-up or nail polish.  Do not wear lotions, powders, or perfumes. You may wear deodorant.  Do not shave 48 hours prior to surgery. Men may shave face and neck.  Do not bring valuables to the hospital.  [facility] is not responsible  for any belongings or valuables.               Contacts, dentures or bridgework may not be worn into surgery.  Leave suitcase in the car. After surgery it may be brought to your room.  For patients admitted to the hospital, discharge time is determined by your treatment team.               Patients discharged the day of surgery will not be allowed to drive home.  Name and phone number of your driver:   Special Instructions:  Special Instructions:Special Instructions: [facility] - Preparing for Surgery  Before surgery, you can play an important role.  Because skin is not sterile, your skin needs to be as free of germs as possible.  You can reduce the number of germs on you skin by washing with CHG (chlorahexidine gluconate) soap before surgery.  CHG is an antiseptic cleaner which kills germs and bonds with the skin to continue killing germs even after washing.  Please DO NOT use if you have an allergy to CHG or antibacterial soaps.  If your skin becomes reddened/irritated stop using the CHG and inform your nurse when you arrive at Short Stay.  Do not shave (including legs and underarms) for at least 48 hours prior to the first CHG shower.  You may shave your face.  Please follow these instructions carefully:   1.  Shower with CHG Soap the night before surgery  and the morning of Surgery.  2.  If you choose to wash your hair, wash your hair first as usual with your normal shampoo.  3.  After you shampoo, rinse your hair and body thoroughly to remove the Shampoo.  4.  Use CHG as you would any other liquid soap.  You can apply chg directly  to the skin and wash gently with scrungie or a clean washcloth.  5.  Apply the CHG Soap to your body ONLY FROM THE NECK DOWN.  Do not use on open wounds or open sores.  Avoid contact with your eyes, ears, mouth and genitals (private parts).  Wash genitals (private parts) with your normal soap.  6.  Wash thoroughly, paying special attention to the area where your surgery will be performed.  7.  Thoroughly rinse your body with warm water from the neck down.  8.  DO NOT shower/wash with your normal soap after using and rinsing off the CHG Soap.  9.  Pat yourself dry with a clean towel.            10.  Wear clean pajamas.            11.  Place clean sheets on your bed the night of your first shower and  do not sleep with pets.  Day of Surgery  Do not apply any lotions the morning of surgery.  Please wear clean clothes to the hospital/surgery center.   Please read over the following fact sheets that you were given: Pain Booklet, Coughing and Deep Breathing and Surgical Site Infection Prevention

## 2013-07-31 NOTE — Progress Notes (Signed)
Anesthesia PAT evaluation:  RE: Consideration of nasal intubation.  Patient is a 48 year old male scheduled for multiple teeth extractions with alveoloplasty in greatest debridement of teeth on 08/01/13 by Dr. Enrique Sack. 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 Other history includes anxiety, arthritis, former smoker, concussion while in high school. BMI is consistent with obesity. PCP is Dr. Doreene Burke with [facility].  ENT Dr. [name].  Oncologist is Dr. [name].  Neck CT on 07/25/13 showed: 35 x 37 x 44 mm right palatine tonsil mass consistent with squamous cell carcinoma. Ipsilateral right neck level II and III adenopathy. Equivocal left level IIA node.  Patient has had no previous surgeries, no previous cardiac studies or sleep study.  He has a history of heavier ETOH intake, but he denies recent ETOH or illicit drug use. He denies blood thinners, nasal fracture history, or epistaxis.  He has had intermittent mild right tonsillar bleeding, but none sustained. Heart RRR, no murmur noted. Lungs sounds diminished but clear.  No significant pre-tibial edema.    We did discuss the possibility that nasal intubation may be needed. Other equipment such as a fiberoptic scope may also be indicated due to his friable tonsillar lesion.  He is comfortable talking further with his assigned anesthesiologist on the day of surgery to discuss the definitive anesthesia plan.  Preoperative labs noted.  CBC WNL.  Cr 1.49.  AST/ALT mildly elevated, but acceptable for OR.    George Hugh [facility] Short Stay Center/Anesthesiology Phone [phone] 07/31/2013 1:36 PM

## 2013-07-31 NOTE — Progress Notes (Signed)
Pt denies SOB, chest pain, and being under the care of a cardiologist. Pt denies ever having an EKG, chest x ray stress test, echo and cardiac cath. Pt made aware to stop taking Aspirin and herbal medications. Do not take any NSAIDs ie: Ibuprofen, Advil, Naproxen or any medication containing Aspirin.

## 2013-08-01 ENCOUNTER — Ambulatory Visit (HOSPITAL_COMMUNITY): Payer: Medicaid Other | Admitting: Critical Care Medicine

## 2013-08-01 ENCOUNTER — Inpatient Hospital Stay (HOSPITAL_COMMUNITY)
Admission: RE | Admit: 2013-08-01 | Discharge: 2013-08-02 | DRG: 987 | Disposition: A | Discharge status: Home / Self Care | Payer: Medicaid Other | Source: Ambulatory Visit | Attending: Internal Medicine | Admitting: Internal Medicine

## 2013-08-01 ENCOUNTER — Encounter (HOSPITAL_COMMUNITY): Payer: Self-pay | Admitting: Critical Care Medicine

## 2013-08-01 ENCOUNTER — Encounter (HOSPITAL_COMMUNITY): Payer: Medicaid Other | Admitting: Vascular Surgery

## 2013-08-01 ENCOUNTER — Encounter (HOSPITAL_COMMUNITY)
Admission: RE | Disposition: A | Discharge status: Home / Self Care | Payer: Self-pay | Source: Ambulatory Visit | Attending: Internal Medicine

## 2013-08-01 ENCOUNTER — Inpatient Hospital Stay (HOSPITAL_COMMUNITY): Payer: Medicaid Other

## 2013-08-01 DIAGNOSIS — G9340 Encephalopathy, unspecified: Secondary | ICD-10-CM | POA: Diagnosis present

## 2013-08-01 DIAGNOSIS — Z79899 Other long term (current) drug therapy: Secondary | ICD-10-CM | POA: Diagnosis not present

## 2013-08-01 DIAGNOSIS — B977 Papillomavirus as the cause of diseases classified elsewhere: Secondary | ICD-10-CM | POA: Diagnosis present

## 2013-08-01 DIAGNOSIS — J9600 Acute respiratory failure, unspecified whether with hypoxia or hypercapnia: Secondary | ICD-10-CM

## 2013-08-01 DIAGNOSIS — K029 Dental caries, unspecified: Secondary | ICD-10-CM | POA: Diagnosis present

## 2013-08-01 DIAGNOSIS — F411 Generalized anxiety disorder: Secondary | ICD-10-CM | POA: Diagnosis present

## 2013-08-01 DIAGNOSIS — K036 Deposits [accretions] on teeth: Secondary | ICD-10-CM

## 2013-08-01 DIAGNOSIS — Z87891 Personal history of nicotine dependence: Secondary | ICD-10-CM | POA: Diagnosis not present

## 2013-08-01 DIAGNOSIS — J95821 Acute postprocedural respiratory failure: Secondary | ICD-10-CM | POA: Diagnosis present

## 2013-08-01 DIAGNOSIS — I10 Essential (primary) hypertension: Secondary | ICD-10-CM | POA: Diagnosis present

## 2013-08-01 DIAGNOSIS — F1020 Alcohol dependence, uncomplicated: Secondary | ICD-10-CM | POA: Diagnosis present

## 2013-08-01 DIAGNOSIS — K0530 Chronic periodontitis, unspecified: Secondary | ICD-10-CM | POA: Diagnosis present

## 2013-08-01 DIAGNOSIS — M264 Malocclusion, unspecified: Secondary | ICD-10-CM | POA: Diagnosis present

## 2013-08-01 DIAGNOSIS — M270 Developmental disorders of jaws: Secondary | ICD-10-CM | POA: Diagnosis present

## 2013-08-01 DIAGNOSIS — C099 Malignant neoplasm of tonsil, unspecified: Secondary | ICD-10-CM

## 2013-08-01 HISTORY — PX: MULTIPLE EXTRACTIONS WITH ALVEOLOPLASTY: SHX5342

## 2013-08-01 LAB — CBC
HCT: 43 % (ref 39.0–52.0)
HEMOGLOBIN: 14.2 g/dL (ref 13.0–17.0)
MCH: 30 pg (ref 26.0–34.0)
MCHC: 33 g/dL (ref 30.0–36.0)
MCV: 90.7 fL (ref 78.0–100.0)
Platelets: 281 10*3/uL (ref 150–400)
RBC: 4.74 MIL/uL (ref 4.22–5.81)
RDW: 13.3 % (ref 11.5–15.5)
WBC: 13.3 10*3/uL — ABNORMAL HIGH (ref 4.0–10.5)

## 2013-08-01 LAB — BASIC METABOLIC PANEL
BUN: 14 mg/dL (ref 6–23)
CO2: 20 mEq/L (ref 19–32)
Calcium: 9.2 mg/dL (ref 8.4–10.5)
Chloride: 101 mEq/L (ref 96–112)
Creatinine, Ser: 1.14 mg/dL (ref 0.50–1.35)
GFR calc Af Amer: 88 mL/min — ABNORMAL LOW (ref >90)
GFR calc non Af Amer: 76 mL/min — ABNORMAL LOW (ref >90)
GLUCOSE: 129 mg/dL — AB (ref 70–99)
Potassium: 5.3 mEq/L (ref 3.7–5.3)
Sodium: 136 mEq/L — ABNORMAL LOW (ref 137–147)

## 2013-08-01 LAB — MRSA PCR SCREENING: MRSA by PCR: NEGATIVE

## 2013-08-01 SURGERY — MULTIPLE EXTRACTION WITH ALVEOLOPLASTY
Anesthesia: General | Site: Mouth

## 2013-08-01 MED ORDER — ROCURONIUM BROMIDE 50 MG/5ML IV SOLN
INTRAVENOUS | Status: AC
  Filled 2013-08-01: qty 1

## 2013-08-01 MED ORDER — DEXAMETHASONE SODIUM PHOSPHATE 4 MG/ML IJ SOLN
INTRAMUSCULAR | Status: DC | PRN
  Administered 2013-08-01: 4 mg via INTRAVENOUS

## 2013-08-01 MED ORDER — LIDOCAINE-EPINEPHRINE 2 %-1:100000 IJ SOLN
INTRAMUSCULAR | Status: DC | PRN
  Administered 2013-08-01 (×6): 1.7 mL via INTRADERMAL

## 2013-08-01 MED ORDER — OXYCODONE-ACETAMINOPHEN 5-325 MG PO TABS
ORAL_TABLET | ORAL | Status: AC
  Administered 2013-08-01: 1 via ORAL
  Filled 2013-08-01: qty 1

## 2013-08-01 MED ORDER — BUPIVACAINE-EPINEPHRINE 0.5% -1:200000 IJ SOLN
INTRAMUSCULAR | Status: DC | PRN
  Administered 2013-08-01 (×2): 1.8 mL

## 2013-08-01 MED ORDER — ALCOHOL (RUBBING) 70 % SOLN
Status: DC | PRN
  Administered 2013-08-01: 60 mL via TOPICAL

## 2013-08-01 MED ORDER — HEMOSTATIC AGENTS (NO CHARGE) OPTIME
TOPICAL | Status: DC | PRN
  Administered 2013-08-01: 1 via TOPICAL

## 2013-08-01 MED ORDER — FENTANYL CITRATE 0.05 MG/ML IJ SOLN
INTRAMUSCULAR | Status: AC
  Filled 2013-08-01: qty 5

## 2013-08-01 MED ORDER — OXYCODONE-ACETAMINOPHEN 5-325 MG PO TABS
ORAL_TABLET | ORAL | Status: AC
  Administered 2013-08-01: 2 via ORAL
  Filled 2013-08-01: qty 2

## 2013-08-01 MED ORDER — SODIUM CHLORIDE 0.9 % IJ SOLN
INTRAMUSCULAR | Status: AC
  Filled 2013-08-01: qty 10

## 2013-08-01 MED ORDER — LACTATED RINGERS IV SOLN
INTRAVENOUS | Status: DC | PRN
  Administered 2013-08-01: 07:00:00 via INTRAVENOUS

## 2013-08-01 MED ORDER — ONDANSETRON HCL 4 MG/2ML IJ SOLN: 4.0000 mg | Freq: Four times a day (QID) | INTRAMUSCULAR | Status: DC | PRN

## 2013-08-01 MED ORDER — SUCCINYLCHOLINE CHLORIDE 20 MG/ML IJ SOLN
INTRAMUSCULAR | Status: AC
Start: 2013-08-01 — End: 2013-08-01
  Filled 2013-08-01: qty 1

## 2013-08-01 MED ORDER — OXYMETAZOLINE HCL 0.05 % NA SOLN
NASAL | Status: AC
  Filled 2013-08-01: qty 15

## 2013-08-01 MED ORDER — PROPOFOL 10 MG/ML IV BOLUS
INTRAVENOUS | Status: DC | PRN
  Administered 2013-08-01: 400 mg via INTRAVENOUS

## 2013-08-01 MED ORDER — DOCUSATE SODIUM 100 MG PO CAPS
100.0000 mg | ORAL_CAPSULE | Freq: Two times a day (BID) | ORAL | Status: DC
  Administered 2013-08-01: 100 mg via ORAL
  Filled 2013-08-01: qty 1

## 2013-08-01 MED ORDER — WATER FOR IRRIGATION, STERILE IR SOLN
Status: DC | PRN
  Administered 2013-08-01: 600 mL via SURGICAL_CAVITY

## 2013-08-01 MED ORDER — LIDOCAINE HCL (CARDIAC) 20 MG/ML IV SOLN
INTRAVENOUS | Status: AC
Start: 2013-08-01 — End: 2013-08-01
  Filled 2013-08-01: qty 5

## 2013-08-01 MED ORDER — LACTATED RINGERS IV SOLN: INTRAVENOUS | Status: DC

## 2013-08-01 MED ORDER — OXYCODONE-ACETAMINOPHEN 5-325 MG PO TABS: ORAL_TABLET | ORAL | Status: DC

## 2013-08-01 MED ORDER — PROPOFOL 10 MG/ML IV BOLUS
INTRAVENOUS | Status: AC
  Filled 2013-08-01: qty 20

## 2013-08-01 MED ORDER — FENTANYL CITRATE 0.05 MG/ML IJ SOLN
INTRAMUSCULAR | Status: DC | PRN
  Administered 2013-08-01 (×3): 50 ug via INTRAVENOUS
  Administered 2013-08-01: 100 ug via INTRAVENOUS

## 2013-08-01 MED ORDER — 0.9 % SODIUM CHLORIDE (POUR BTL) OPTIME
TOPICAL | Status: DC | PRN
  Administered 2013-08-01: 1000 mL

## 2013-08-01 MED ORDER — BUPIVACAINE-EPINEPHRINE (PF) 0.5% -1:200000 IJ SOLN
INTRAMUSCULAR | Status: AC
  Filled 2013-08-01: qty 10.8

## 2013-08-01 MED ORDER — LEVOFLOXACIN IN D5W 750 MG/150ML IV SOLN
750.0000 mg | INTRAVENOUS | Status: DC
  Administered 2013-08-01: 750 mg via INTRAVENOUS
  Filled 2013-08-01 (×2): qty 150

## 2013-08-01 MED ORDER — ALUM & MAG HYDROXIDE-SIMETH 200-200-20 MG/5ML PO SUSP: 30.0000 mL | Freq: Four times a day (QID) | ORAL | Status: DC | PRN

## 2013-08-01 MED ORDER — LIDOCAINE-EPINEPHRINE 2 %-1:100000 IJ SOLN
INTRAMUSCULAR | Status: AC
  Filled 2013-08-01: qty 10.2

## 2013-08-01 MED ORDER — HYDROMORPHONE HCL PF 1 MG/ML IJ SOLN
INTRAMUSCULAR | Status: AC
  Administered 2013-08-01: 0.5 mg via INTRAVENOUS
  Filled 2013-08-01: qty 1

## 2013-08-01 MED ORDER — SODIUM CHLORIDE 0.9 % IJ SOLN: 3.0000 mL | Freq: Two times a day (BID) | INTRAMUSCULAR | Status: DC

## 2013-08-01 MED ORDER — SODIUM CHLORIDE 0.9 % IJ SOLN: 3.0000 mL | INTRAMUSCULAR | Status: DC | PRN

## 2013-08-01 MED ORDER — OXYCODONE-ACETAMINOPHEN 5-325 MG PO TABS
1.0000 | ORAL_TABLET | ORAL | Status: DC | PRN
  Administered 2013-08-01 (×2): 2 via ORAL
  Administered 2013-08-01: 1 via ORAL
  Administered 2013-08-02 (×2): 2 via ORAL
  Filled 2013-08-01 (×3): qty 2

## 2013-08-01 MED ORDER — MORPHINE SULFATE 2 MG/ML IJ SOLN
INTRAMUSCULAR | Status: AC
  Administered 2013-08-01: 1 mg via INTRAVENOUS
  Filled 2013-08-01: qty 1

## 2013-08-01 MED ORDER — MIDAZOLAM HCL 2 MG/2ML IJ SOLN
INTRAMUSCULAR | Status: AC
  Filled 2013-08-01: qty 2

## 2013-08-01 MED ORDER — SUCCINYLCHOLINE CHLORIDE 20 MG/ML IJ SOLN
INTRAMUSCULAR | Status: DC | PRN
  Administered 2013-08-01: 140 mg via INTRAVENOUS

## 2013-08-01 MED ORDER — FOLIC ACID 1 MG PO TABS
1.0000 mg | ORAL_TABLET | Freq: Every day | ORAL | Status: DC
  Administered 2013-08-01: 1 mg via ORAL
  Filled 2013-08-01 (×2): qty 1

## 2013-08-01 MED ORDER — ALBUTEROL SULFATE (2.5 MG/3ML) 0.083% IN NEBU: 2.5000 mg | INHALATION_SOLUTION | RESPIRATORY_TRACT | Status: DC | PRN

## 2013-08-01 MED ORDER — ONDANSETRON HCL 4 MG/2ML IJ SOLN
4.0000 mg | Freq: Once | INTRAMUSCULAR | Status: AC | PRN
  Administered 2013-08-01: 4 mg via INTRAVENOUS

## 2013-08-01 MED ORDER — ONDANSETRON HCL 4 MG/2ML IJ SOLN
INTRAMUSCULAR | Status: AC
  Administered 2013-08-01: 4 mg via INTRAVENOUS
  Filled 2013-08-01: qty 2

## 2013-08-01 MED ORDER — HYDROMORPHONE HCL PF 1 MG/ML IJ SOLN
0.2500 mg | INTRAMUSCULAR | Status: DC | PRN
  Administered 2013-08-01 (×2): 0.5 mg via INTRAVENOUS

## 2013-08-01 MED ORDER — ONDANSETRON HCL 4 MG/2ML IJ SOLN
INTRAMUSCULAR | Status: DC | PRN
  Administered 2013-08-01: 4 mg via INTRAVENOUS

## 2013-08-01 MED ORDER — ONDANSETRON HCL 4 MG PO TABS: 4.0000 mg | ORAL_TABLET | Freq: Four times a day (QID) | ORAL | Status: DC | PRN

## 2013-08-01 MED ORDER — EPHEDRINE SULFATE 50 MG/ML IJ SOLN
INTRAMUSCULAR | Status: AC
  Filled 2013-08-01: qty 1

## 2013-08-01 MED ORDER — LIDOCAINE HCL (CARDIAC) 20 MG/ML IV SOLN
INTRAVENOUS | Status: DC | PRN
  Administered 2013-08-01: 100 mg via INTRAVENOUS

## 2013-08-01 MED ORDER — OXYMETAZOLINE HCL 0.05 % NA SOLN
NASAL | Status: DC | PRN
  Administered 2013-08-01: 1 via NASAL

## 2013-08-01 MED ORDER — VITAMIN B-1 100 MG PO TABS
100.0000 mg | ORAL_TABLET | Freq: Every day | ORAL | Status: DC
  Administered 2013-08-01: 100 mg via ORAL
  Filled 2013-08-01 (×2): qty 1

## 2013-08-01 MED ORDER — SODIUM CHLORIDE 0.9 % IV SOLN
250.0000 mL | INTRAVENOUS | Status: DC | PRN
  Administered 2013-08-01: 10 mL/h via INTRAVENOUS

## 2013-08-01 MED ORDER — MORPHINE SULFATE 2 MG/ML IJ SOLN
1.0000 mg | INTRAMUSCULAR | Status: DC | PRN
  Administered 2013-08-01: 1 mg via INTRAVENOUS

## 2013-08-01 SURGICAL SUPPLY — 34 items
ALCOHOL 70% 16 OZ (MISCELLANEOUS) ×3 IMPLANT
ATTRACTOMAT 16X20 MAGNETIC DRP (DRAPES) ×3 IMPLANT
BLADE SURG 15 STRL LF DISP TIS (BLADE) ×1 IMPLANT
BLADE SURG 15 STRL SS (BLADE) ×2
COVER SURGICAL LIGHT HANDLE (MISCELLANEOUS) ×3 IMPLANT
GAUZE PACKING FOLDED 2  STR (GAUZE/BANDAGES/DRESSINGS) ×2
GAUZE PACKING FOLDED 2 STR (GAUZE/BANDAGES/DRESSINGS) ×1 IMPLANT
GAUZE SPONGE 4X4 16PLY XRAY LF (GAUZE/BANDAGES/DRESSINGS) ×3 IMPLANT
GLOVE BIOGEL PI IND STRL 6 (GLOVE) ×1 IMPLANT
GLOVE BIOGEL PI INDICATOR 6 (GLOVE) ×2
GLOVE SURG ORTHO 8.0 STRL STRW (GLOVE) ×3 IMPLANT
GLOVE SURG SS PI 6.0 STRL IVOR (GLOVE) ×3 IMPLANT
GOWN STRL REUS W/ TWL LRG LVL3 (GOWN DISPOSABLE) ×1 IMPLANT
GOWN STRL REUS W/TWL 2XL LVL3 (GOWN DISPOSABLE) ×3 IMPLANT
GOWN STRL REUS W/TWL LRG LVL3 (GOWN DISPOSABLE) ×2
HEMOSTAT SURGICEL .5X2 ABSORB (HEMOSTASIS) IMPLANT
KIT BASIN OR (CUSTOM PROCEDURE TRAY) ×3 IMPLANT
KIT ROOM TURNOVER OR (KITS) ×3 IMPLANT
MANIFOLD NEPTUNE WASTE (CANNULA) ×3 IMPLANT
NEEDLE BLUNT 16X1.5 OR ONLY (NEEDLE) ×3 IMPLANT
NS IRRIG 1000ML POUR BTL (IV SOLUTION) ×3 IMPLANT
PACK EENT II TURBAN DRAPE (CUSTOM PROCEDURE TRAY) ×3 IMPLANT
PAD ARMBOARD 7.5X6 YLW CONV (MISCELLANEOUS) ×3 IMPLANT
SPONGE SURGIFOAM ABS GEL 100 (HEMOSTASIS) ×3 IMPLANT
SPONGE SURGIFOAM ABS GEL 12-7 (HEMOSTASIS) IMPLANT
SPONGE SURGIFOAM ABS GEL SZ50 (HEMOSTASIS) IMPLANT
SUCTION FRAZIER TIP 10 FR DISP (SUCTIONS) ×3 IMPLANT
SUT CHROMIC 3 0 PS 2 (SUTURE) ×6 IMPLANT
SUT CHROMIC 4 0 P 3 18 (SUTURE) ×3 IMPLANT
SYR 50ML SLIP (SYRINGE) ×3 IMPLANT
TOWEL OR 17X26 10 PK STRL BLUE (TOWEL DISPOSABLE) ×3 IMPLANT
TUBE CONNECTING 12'X1/4 (SUCTIONS) ×1
TUBE CONNECTING 12X1/4 (SUCTIONS) ×2 IMPLANT
YANKAUER SUCT BULB TIP NO VENT (SUCTIONS) ×3 IMPLANT

## 2013-08-01 NOTE — Progress Notes (Signed)
PRE-OPERATIVE NOTE:  08/01/2013 Mason [name] [id number]  VITALS: BP 145/92  Pulse 88  Temp(Src) 97.9 F (36.6 C)  Resp 16  Wt 329 lb 9.4 oz (149.5 kg)  SpO2 98%  Lab Results  Component Value Date   WBC 8.7 07/31/2013   HGB 13.9 07/31/2013   HCT 42.3 07/31/2013   MCV 90.6 07/31/2013   PLT 265 07/31/2013   BMET    Component Value Date/Time   NA 143 07/31/2013 1140   K 4.6 07/31/2013 1140   CL 107 07/31/2013 1140   CO2 23 07/31/2013 1140   GLUCOSE 93 07/31/2013 1140   BUN 16 07/31/2013 1140   BUN 14.6 07/24/2013 1022   CREATININE 1.49* 07/31/2013 1140   CREATININE 1.4* 07/24/2013 1022   CALCIUM 9.4 07/31/2013 1140   GFRNONAA 55* 07/31/2013 1140   GFRAA 64* 07/31/2013 1140    No results found for this basename: INR, PROTIME   No results found for this basename: PTT     [name] presents for multiple dental extractions with alveoloplasty and pre-prosthetic surgery as needed in the OR with general anesthesia.   SUBJECTIVE: The patient denies any acute medical or dental changes and agrees to proceed with treatment as planned.  EXAM: No sign of acute dental changes.  ASSESSMENT: Patient is affected by chronic peridontitis, accretions, dental caries, mandibular tori, and malocclusion.  PLAN: Patient agrees to proceed with treatment as planned in the operating room as previously discussed and accepts the risks, benefits, complications of the proposed treatment. We discussed risks for bleeding, bruising, swelling, nerve damage, infection, root tip fracture, mandible fracture, sinus involvement, soft tissue tears in floor of mouth, anesthesia and respiratory complications, and other complications not mentioned above.   [name], DDS

## 2013-08-01 NOTE — Op Note (Addendum)
Patient:            Mason [name] Date of Birth:  23-Dec-1965 MRN:                [id number]   DATE OF PROCEDURE:  08/01/2013               OPERATIVE REPORT   PREOPERATIVE DIAGNOSES: 1. Squamous cell carcinoma right tonsil 2. Pre-chemoradiation therapy dental protocol 3. Chronic periodontitis 4. Accretions 5. Dental caries 6. Mandibular left lingual torus 7. Malocclusion  POSTOPERATIVE DIAGNOSES: 1. Squamous cell carcinoma right tonsil 2. Pre-chemoradiation therapy dental protocol 3. Chronic periodontitis 4. Accretions 5. Dental caries 6. Mandibular left lingual torus  7. Malocclusion  OPERATIONS: 1. Multiple extraction of tooth numbers 1, 15, 17, 31, and 32 2. 4 Quadrants of alveoloplasty 3. Gross debridement of remaining dentition 4. Mandibular left lingual torus reduction   SURGEON: [name], DDS  ASSISTANT: Camie Patience, (dental assistant)  ANESTHESIA: General anesthesia via nasoendotracheal tube.  MEDICATIONS: 1. Ancef 3 g IV prior to invasive dental procedures. 2. Local anesthesia with a total utilization of 4 carpules each containing 34 mg of lidocaine with 0.017 mg of epinephrine as well as 2 carpules each containing 9 mg of bupivacaine with 0.009 mg of epinephrine.  SPECIMENS: There are 5 teeth that were discarded.  DRAINS: None  CULTURES: None  COMPLICATIONS: None   ESTIMATED BLOOD LOSS: 70 mLs.  INTRAVENOUS FLUIDS: 800 mLs of Lactated ringers solution.  INDICATIONS: The patient was recently diagnosed with squamous cell carcinoma of the right tonsil.  A medically necessary dental consultation was then requested to evaluate poor dentition prior to anticipated chemoradiation therapy.  The patient was examined and treatment planned for multiple extractions with alveoloplasty, pre-prosthetic surgery as indicated, and gross debridement of remaining dentition.  This treatment plan was formulated to decrease the risks and complications  associated with dental infection from affecting the patient's systemic health and to prevent future complications such as osteoradionecrosis.  OPERATIVE FINDINGS: Patient was examined operating room number 9.  The teeth were identified for extraction. The patient was noted to be affected by chronic periodontitis, accretions, dental caries, mandibular left lingual torus, malocclusion, squamous cell carcinoma right tonsil with anticipated chemoradiation therapy.   DESCRIPTION OF PROCEDURE: Patient was brought to the main operating room number 9. Patient was then placed in the supine position on the operating table. General anesthesia was then induced per the anesthesia team. The patient was then prepped and draped in the usual manner for dental medicine procedure. A timeout was performed. The patient was identified and procedures were verified. A throat pack was placed at this time. The oral cavity was then thoroughly examined with the findings noted above. The patient was then ready for dental medicine procedure as follows:  Local anesthesia was then administered sequentially with a total utilization of 4 carpules each containing 34 mg of lidocaine with 0.017 mg of epinephrine as well as 2 carpules  each containing 9 mg bupivacaine with 0.009 mg of epinephrine.  The Maxillary left and right quadrants first approached. Anesthesia was then delivered utilizing infiltration with lidocaine with epinephrine. A #15 blade incision was then made from the maxillary right tuberosity and extended to the mesial number 4.  A  surgical flap was then carefully reflected. Appropriate amounts of buccal and interseptal bone were then removed utilizing a surgical handpiece and bur and copious amounts of sterile water around tooth #1.  The tooth was then subluxated with a  series of straight elevators. Tooth number 1 was then removed with a 53R with the exception of the distal buccal root.  Further bone was then removed around  the distal buccal root. The root was then elevated out with a cryers elevators without complication. Alveoloplasty was then performed utilizing a ronguers and bone file. The surgical site was then irrigated with copious amounts of sterile saline. A piece of Surgifoam was placed in the extraction sockets. The tissues were approximated and trimmed appropriately. The surgical site was then closed from the maxillary right tuberosity and extended to the distal of #4 utilizing 3-0 chromic gut suture in a continuous interrupted suture technique x1.  The maxillary left surgical site was then approached. A 15 blade incision was made from the maxillary left tuberosity and extended to the distal of #13. A surgical flap was then carefully reflected. Appropriate amounts of buccal and interseptal bone was then removed around tooth 15 utilizing a surgical handpiece and bur and copious amounts sterile water. The tooth was then subluxated with a series straight elevators. Tooth was then removed with a 53L forceps without complication.  Alveoloplasty was then performed utilizing a rongeur and bone. The surgical site was then irrigated with copious of sterile saline. A piece of Surgifoam was placed in the extraction socket. The surgical site was then closed from the maxillary left tuberosity and extended to the distal of #13 utilizing 3-0 chromic gut suture in a continuous interrupted suture technique x1.  At this point time, the mandibular quadrants were approached. The patient was given bilateral inferior alveolar nerve blocks and long buccal nerve blocks utilizing the bupivacaine with epinephrine. Further infiltration was then achieved utilizing the lidocaine with epinephrine. A 15 blade incision was then made from the distal of number of #17 and extended to the distal of #20. A 15 blade incision was then further continued on the lingual aspect to the mesial of #22.   A surgical flap was then carefully reflected on both of  buccal and lingual aspects from the distal of #17 to the mesial of #20.  Buccal and interseptal bone were then removed around tooth #17. Tooth #17 was then subluxated with a series of straight elevators. Tooth #17 was then removed with a 23 forceps without complications. At this point time the mandibular lingual flap was then reflected to the mesial of number 22 on the lingual aspect. The mandibular left torus was visualized and removed utilizing a surgical handpiece and bur and copious of sterile saline. Alveoloplasty some performed utilizing a rongeur and bone file. The surgical site was then irrigated with copious amounts sterile saline x2. A piece of Surgifoam was then placed in the extraction socket numbers 17. The surgical site was then closed from the distal of #17 and extended to the distal of #20 utilizing 3-0 chromic gut suture in a continuous interrupted suture technique x1. Interrupted interproximal suture were then placed between tooth numbers 20/21, and 21/22.   At this point time the mandibular right quadrant was approached. A 15 blade incision was made from the distal of #32 and extended to the distal of #28. A surgical flap was then carefully reflected. Appropriate amounts of buccal and interseptal bone were then removed around tooth numbers 31 and 32 utilizing a surgical handpiece and copious amount of sterile water. Tooth numbers  31 and 32 were then subluxated with a series of straight elevators. Tooth numbers 31 and 32 were then removed utilizing a 23 forceps without complications. Alveoloplasty was  then performed utilizing a rongeurs and bone file. The tissues were approximated and trimmed appropriately. The surgical sites were then irrigated with copious amounts of sterile saline. A piece of Surgifoam was placed in extraction sockets numbers 31 and 32 .The mandibular left surgical site was then closed from the distal of #32 and extended to the mesial of #28 utilizing 3-0 chromic gut suture  in a continuous interrupted suture technique x1. Please note the mandibular right lingual aspect was palpated and was determined that no mandibular torus reduction was needed at this time.    At this point in time the remaining dentition was approached. A sonic scaler was used to remove significant accretions. A series of hand curettes were then utilized to remove further accretions. A sonic scaler was then again used to refine the removal of accretions. This completed the gross debridement procedure.  At this point time, the entire mouth was irrigated with copious amounts of sterile saline. The patient was examined for complications, seeing none, the dental medicine procedure was deemed to be complete. The throat pack was removed at this time. A series of 4 x 4 gauze were placed in the mouth to aid hemostasis. The patient was then handed over to the anesthesia team for final disposition. After an appropriate amount of time, the patient was extubated and taken to the postanesthsia care unit with stable vital signs and a good condition. All counts were correct for the dental medicine procedure. The patient will be given appropriate pain medication and will return to clinic on Monday, 08/05/2013 at 10 AM for insertion of fluoride trays and scatter protection devices.   [name], DDS.

## 2013-08-01 NOTE — H&P (Signed)
08/01/2013 Patient:            Mason [name] Date of Birth:  06-20-1965 MRN:                [id number]  BP 145/92  Pulse 88  Temp(Src) 97.9 F (36.6 C)  Resp 16  Wt 329 lb 9.4 oz (149.5 kg)  SpO2 98%  [name] presents for dental OR procedures. Patient deneis acute medical or dental changes. See Dr. Alvy Bimler note of 07/16/13 below to use as H and P for dental OR procedures.  Dr. [facility]  CONSULT NOTE  Patient Care Team:  Angelica Chessman, MD as PCP - General (Internal Medicine)  [name] Patient (General Practice)  Brooks Sailors, RN as Registered Nurse (Oncology)  [name], MD as Referring Physician (Otolaryngology)  CHIEF COMPLAINTS/PURPOSE OF CONSULTATION:  Squamous cell carcinoma of right tonsil with regional lymphadenopathy  HISTORY OF PRESENTING ILLNESS:  Mason [name] 48 y.o. male is here because of newly diagnosed squamous cell carcinoma.  According to the patient, the first initial presentation was due to recurrent hemoptysis. He also had sensation of sore throat and palpated a mass in the right neck.  he denies any hearing deficit, difficulties with chewing food, swallowing difficulties, painful swallowing, changes in the quality of his voice or abnormal weight loss.  On 07/09/13 , he underwent ENT evaluation and was found to have abnormal right tonsil mass extending to the soft palate and uvula. Pathology from biopsy of right tonsil confirmed diagnosis of squamous cell carcinoma, HPV Positive  MEDICAL HISTORY:  Past Medical History   Diagnosis  Date   .  Tonsillar cancer  07/09/13   .  Cancer  07/09/13     Tonsil/ right/squamous cell carcinoma   .  Knee pain, chronic    .  Anxiety      mild new dx   .  Arthritis      knees,hips   SURGICAL HISTORY:  History reviewed. No pertinent past surgical history.  SOCIAL HISTORY:  History    Social History   .  Marital Status:  Single     Spouse Name:  N/A      Number of Children:  N/A   .  Years of Education:  N/A    Occupational History   .  Not on file.    Social History Main Topics   .  Smoking status:  Former Smoker -- 1.00 packs/day for 20 years     Types:  Cigarettes     Quit date:  07/16/2003   .  Smokeless tobacco:  Never Used   .  Alcohol Use:  7.2 oz/week     12 Cans of beer per week      Comment: social [place], beer   .  Drug Use:  No   .  Sexual Activity:  Not on file    Other Topics  Concern   .  Not on file    Social History Narrative   .  No narrative on file   FAMILY HISTORY:  Family History   Problem  Relation  Age of Onset   .  CVA     .  Diabetes     .  Heart attack     ALLERGIES: has No Known Allergies.  MEDICATIONS:  Current Outpatient Prescriptions   Medication  Sig  Dispense  Refill   .  HYDROcodone-acetaminophen (NORCO/VICODIN) 5-325  MG per tablet  1 to 2 tabs every 4 to 6 hours as needed for pain.  20 tablet  0   .  meloxicam (MOBIC) 15 MG tablet  Take 1 tablet (15 mg total) by mouth daily.  15 tablet  0    No current facility-administered medications for this visit.   REVIEW OF SYSTEMS:  Constitutional: Denies fevers, chills or abnormal night sweats  Eyes: Denies blurriness of vision, double vision or watery eyes  Ears, nose, mouth, throat, and face: Denies mucositis  Respiratory: Denies cough, dyspnea or wheezes  Cardiovascular: Denies palpitation, chest discomfort or lower extremity swelling  Gastrointestinal: Denies nausea, heartburn or change in bowel habits  Skin: Denies abnormal skin rashes  Lymphatics: Denies new lymphadenopathy or easy bruising  Neurological:Denies numbness, tingling or new weaknesses  Behavioral/Psych: Mood is stable, no new changes  All other systems were reviewed with the patient and are negative.  PHYSICAL EXAMINATION:  ECOG PERFORMANCE STATUS: 0 - Asymptomatic  Filed Vitals:    07/16/13 1123   BP:  155/83   Pulse:  85   Temp:  98.3 F (36.8 C)   Resp:  20     Filed Weights    07/16/13 1123   Weight:  333 lb 12.8 oz (151.411 kg)   GENERAL:alert, no distress and comfortable. He is morbidly obese  SKIN: skin color, texture, turgor are normal, no rashes or significant lesions  EYES: normal, conjunctiva are pink and non-injected, sclera clear  OROPHARYNX:abnormal lesion is seen on the right tonsillar surface. Tongue appears normal.  NECK: supple, thyroid normal size, non-tender, without nodularity  LYMPH: large palpable lymphadenopathy in the right level Il, none at the axillary or inguinal regions  LUNGS: clear to auscultation and percussion with normal breathing effort  HEART: regular rate & rhythm and no murmurs and no lower extremity edema  ABDOMEN:abdomen soft, non-tender and normal bowel sounds  Musculoskeletal:no cyanosis of digits and no clubbing  PSYCH: alert & oriented x 3 with fluent speech  NEURO: no focal motor/sensory deficits  ASSESSMENT:  Newly diagnosed squamous cell carcinoma of the Head & Neck, HPV Positive  PLAN:  #1 Squamous cell carcinoma of right tonsil with lymphadenopathy  Stage of the disease is to be determined, a PET/CT scan has been ordered.  Prognosis would depend on the results for the PET/CT scan, to be discussed and reviewed in the next visit.  Treatment options would include chemotherapy only, radiation only or chemotherapy in combination with radiation therapy.  He has an appointment to see ENT surgeons at [facility]  I will hold off ordering further tests until PET/CT result is available  #2 Alcoholism  I gave him advice to stay abstinent from alcoholism  All questions were answered. The patient knows to call the clinic with any problems, questions or concerns.  I spent 55 minutes counseling the patient face to face. The total time spent in the appointment was 60 minutes and more than 50% was on counseling.  [facility], [place], MD  07/16/2013 7:19 PM

## 2013-08-01 NOTE — Anesthesia Procedure Notes (Signed)
Procedure Name: Intubation Date/Time: 08/01/2013 7:24 AM Performed by: Carola Frost Pre-anesthesia Checklist: Patient identified, Timeout performed, Emergency Drugs available, Suction available and Patient being monitored Patient Re-evaluated:Patient Re-evaluated prior to inductionOxygen Delivery Method: Circle system utilized Intubation Type: IV induction Ventilation: Mask ventilation without difficulty and Oral airway inserted - appropriate to patient size Laryngoscope Size: Mac and 4 Grade View: Grade III Nasal Tubes: Left, Nasal Rae, Nasal prep performed and Magill forceps- large, utilized Tube size: 8.0 mm Number of attempts: 1 Placement Confirmation: positive ETCO2,  breath sounds checked- equal and bilateral and ETT inserted through vocal cords under direct vision Tube secured with: Tape Dental Injury: Teeth and Oropharynx as per pre-operative assessment and Bloody posterior oropharynx

## 2013-08-01 NOTE — Progress Notes (Signed)
Pt is stable, but extremely sleepy.  Occ periods of apnea.  Unable to wean off O2.  Still complains of moderate to severe pain, but cannot keep eyes open. Dr. Enrique Sack aware. Hospitalist in PACU to assess pt and write admission orders. Family updated as well

## 2013-08-01 NOTE — Transfer of Care (Signed)
Immediate Anesthesia Transfer of Care Note  Patient: Mason [name]  Procedure(s) Performed: Procedure(s): Extraction of tooth #'s 1,15,17,31, 32 with alveoloplasty, mandibular left torus reduction, and gross debridement of remaining teeth. (N/A)  Patient Location: PACU  Anesthesia Type:General  Level of Consciousness: awake, alert  and oriented  Airway & Oxygen Therapy: Patient Spontanous Breathing and Patient connected to face mask oxygen  Post-op Assessment: Report given to PACU RN, Post -op Vital signs reviewed and stable and Patient moving all extremities X 4  Post vital signs: Reviewed and stable  Complications: No apparent anesthesia complications

## 2013-08-01 NOTE — H&P (Signed)
[facility] History and Physical  Mason [name] VEL:[id number] DOB: 1965-06-03 DOA: 08/01/2013  Referring physician: Dr Enrique Sack PCP: Angelica Chessman, MD   Chief Complaint: AMS. Hypoxemia.   HPI: Mason [name] is a 48 y.o. 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 He underwent  multiple extraction of tooth numbers 1, 15, 17, 31, and 32 , 4 Quadrants of alveoloplasty , Gross debridement of remaining dentition, Mandibular left lingual torus reduction by Dr Enrique Sack 4-16. Patient unable to be wean of Oxygen. He is requiring 3 L at this time. He is lethargic, wake up to answer questions. He is following commands. He denies dyspnea, chest pain or abdominal pain. He falls sleep in between conversation.     Review of Systems:  Negative except as per HPI.     Past Medical History  Diagnosis Date  . 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 Knee pain, chronic   . Anxiety     mild new dx  . Arthritis     knees,hips  . Concussion     Hx: in high school x 2   No past surgical history on file. Social History:  reports that he quit smoking about 10 years ago. His smoking use included Cigarettes. He has a 20 pack-year smoking history. He has never used smokeless tobacco. He reports that he drinks about 7.2 ounces of alcohol per week. He reports that he uses illicit drugs (Marijuana).  No Known Allergies  Family History  Problem Relation Age of Onset  . CVA    . Diabetes    . Heart attack    . Arthritis Father   . Arthritis Mother      Prior to Admission medications   Medication Sig Start Date End Date Taking? Authorizing Provider  oxyCODONE-acetaminophen (PERCOCET) 5-325 MG per tablet Take one or two tablets by mouth every 4-6 hours as needed for pain. 08/01/13   [name], DDS   Physical Exam: Filed Vitals:   08/01/13 1130  BP: 159/96  Pulse:  80  Temp:   Resp: 15    BP 159/96  Pulse 80  Temp(Src) 97.3 F (36.3 C)  Resp 15  Wt 149.5 kg (329 lb 9.4 oz)  SpO2 99%  General:  Appears calm and comfortable Eyes: PERRL, normal lids, irises & conjunctiva ENT: limitation exam, recent sx, patient with limitation to open mouth.  Neck: ice pad in place.  Cardiovascular: RRR, no m/r/g. No LE edema. Telemetry: SR, no arrhythmias  Respiratory: CTA bilaterally, no w/r/r. Normal respiratory effort. Abdomen: soft, ntnd Skin: no rash or induration seen on limited exam Musculoskeletal: grossly normal tone BUE/BLE Neurologic: grossly non-focal. Lethargic but wake up to answer questions. Following command.           Labs on Admission:  Basic Metabolic Panel:  Recent Labs Lab 07/31/13 1140  NA 143  K 4.6  CL 107  CO2 23  GLUCOSE 93  BUN 16  CREATININE 1.49*  CALCIUM 9.4   Liver Function Tests:  Recent Labs Lab 07/31/13 1140  AST 41*  ALT 55*  ALKPHOS 59  BILITOT 0.3  PROT 7.4  ALBUMIN 4.0   No results found for this basename: LIPASE, AMYLASE,  in the last 168 hours No results found for this basename: AMMONIA,  in the last 168 hours CBC:  Recent Labs Lab 07/31/13 1140  WBC 8.7  HGB  13.9  HCT 42.3  MCV 90.6  PLT 265   Cardiac Enzymes: No results found for this basename: CKTOTAL, CKMB, CKMBINDEX, TROPONINI,  in the last 168 hours  BNP (last 3 results) No results found for this basename: PROBNP,  in the last 8760 hours CBG: No results found for this basename: GLUCAP,  in the last 168 hours  Radiological Exams on Admission: No results found.    Assessment/Plan Active Problems:   Chronic periodontitis   Respiratory failure, acute  1-Acute hypoxic respiratory failure; admit to step down for close observation. Might be related to hypoventilation, sedation. Will check ABG, Chest x ray. Continue to titrate oxygen required as needed. Check labs, cbc, Bmet.  2-Squamous cell carcinoma right tonsil;  he  follows at [facility] and with Dr Alvy Bimler.   3-Encephalopathy; patient lethargic. Likely secondary to anesthesia and opioids. Check ABG, CBG. Monitor in the step down unit.  4-Elevated Blood pressure; no prior history of hypertension. Monitor.  5-pre -chemoradiation therapy dental protocol; S/P multiple tooth extraction; per Dr Enrique Sack.  6-DVT prophylaxis; SCD.   Code Status: full code.  Family Communication: Care discussed with patient.  Disposition Plan: expect 2 nights depending on clinical evolution.   Time spent: 75 minutes.   [facility] Pager [phone]

## 2013-08-01 NOTE — Discharge Instructions (Signed)
MOUTH CARE AFTER SURGERY ° °FACTS: °· Ice used in ice bag helps keep the swelling down, and can help lessen the pain. °· It is easier to treat pain BEFORE it happens. °· Spitting disturbs the clot and may cause bleeding to start again, or to get worse. °· Smoking delays healing and can cause complications. °· Sharing prescriptions can be dangerous.  Do not take medications not recently prescribed for you. °· Antibiotics may stop birth control pills from working.  Use other means of birth control while on antibiotics. °· Warm salt water rinses after the first 24 hours will help lessen the swelling:  Use 1/2 teaspoonful of table salt per oz.of water. ° °DO NOT: °· Do not spit.  Do not drink through a straw. °· Strongly advised not to smoke, dip snuff or chew tobacco at least for 3 days. °· Do not eat sharp or crunchy foods.  Avoid the area of surgery when chewing. °· Do not stop your antibiotics before your instructions say to do so. °· Do not eat hot foods until bleeding has stopped.  If you need to, let your food cool down to room temperature. ° °EXPECT: °· Some swelling, especially first 2-3 days. °· Soreness or discomfort in varying degrees.  Follow your dentist's instructions about how to handle pain before it starts. °· Pinkish saliva or light blood in saliva, or on your pillow in the morning.  This can last around 24 hours. °· Bruising inside or outside the mouth.  This may not show up until 2-3 days after surgery.  Don't worry, it will go away in time. °· Pieces of "bone" may work themselves loose.  It's OK.  If they bother you, let us know. ° °WHAT TO DO IMMEDIATELY AFTER SURGERY: °· Bite on the gauze with steady pressure for 1-2 hours.  Don't chew on the gauze. °· Do not lie down flat.  Raise your head support especially for the first 24 hours. °· Apply ice to your face on the side of the surgery.  You may apply it 20 minutes on and a few minutes off.  Ice for 8-12 hours.  You may use ice up to 24  hours. °· Before the numbness wears off, take a pain pill as instructed. °· Prescription pain medication is not always required. ° °SWELLING: °· Expect swelling for the first couple of days.  It should get better after that. °· If swelling increases 3 days or so after surgery; let us know as soon as possible. ° °FEVER: °· Take Tylenol every 4 hours if needed to lower your temperature, especially if it is at 100F or higher. °· Drink lots of fluids. °· If the fever does not go away, let us know. ° °BREATHING TROUBLE: °· Any unusual difficulty breathing means you have to have someone bring you to the emergency room ASAP ° °BLEEDING: °· Light oozing is expected for 24 hours or so. °· Prop head up with pillows °· Avoid spitting °· Do not confuse bright red fresh flowing blood with lots of saliva colored with a little bit of blood. °· If you notice some bleeding, place gauze or a tea bag where it is bleeding and apply CONSTANT pressure by biting down for 1 hour.  Avoid talking during this time.  Do not remove the gauze or tea bag during this hour to "check" the bleeding. °· If you notice bright RED bleeding FLOWING out of particular area, and filling the floor of your mouth, put   a wad of gauze on that area, bite down firmly and constantly.  Call us immediately.  If we're closed, have someone bring you to the emergency room. ° °ORAL HYGIENE: °· Brush your teeth as usual after meals and before bedtime. °· Use a soft toothbrush around the area of surgery. °· DO NOT AVOID BRUSHING.  Otherwise bacteria(germs) will grow and may delay healing or encourage infection. °· Since you cannot spit, just gently rinse and let the water flow out of your mouth. °· DO NOT SWISH HARD. ° °EATING: °· Cool liquids are a good point to start.  Increase to soft foods as tolerated. ° °PRESCRIPTIONS: °· Follow the directions for your prescriptions exactly as written. °· If Dr. Caily Rakers gave you a narcotic pain medication, do not drive, operate  machinery or drink alcohol when on that medication. ° °QUESTIONS: °· Call our office during office hours [phone] or call the Emergency Room at [phone]. ° °What to Eat after Tooth extraction:   ° ° °For your first meals, you should eat lightly; only small meals at first.   Avoid Sharp, Crunchy, and Hot foods.   If you do not have nausea, you may eat larger meals.  Avoid spicy, greasy and heavy food, as these may make you sick after the anesthesia.  °General Anesthesia, Adult, Care After  °Refer to this sheet in the next few weeks. These instructions provide you with information on caring for yourself after your procedure. Your health care provider may also give you more specific instructions. Your treatment has been planned according to current medical practices, but problems sometimes occur. Call your health care provider if you have any problems or questions after your procedure.  °WHAT TO EXPECT AFTER THE PROCEDURE  °After the procedure, it is typical to experience:  °Sleepiness.  °Nausea and vomiting. °HOME CARE INSTRUCTIONS  °For the first 24 hours after general anesthesia:  °Have a responsible person with you.  °Do not drive a car. If you are alone, do not take public transportation.  °Do not drink alcohol.  °Do not take medicine that has not been prescribed by your health care provider.  °Do not sign important papers or make important decisions.  °You may resume a normal diet and activities as directed by your health care provider.  °Change bandages (dressings) as directed.  °If you have questions or problems that seem related to general anesthesia, call the hospital and ask for the anesthetist or anesthesiologist on call. °SEEK MEDICAL CARE IF:  °You have nausea and vomiting that continue the day after anesthesia.  °You develop a rash. °SEEK IMMEDIATE MEDICAL CARE IF:  °You have difficulty breathing.  °You have chest pain.  °You have any allergic problems. °Document Released: 07/11/2000 Document  Revised: 12/05/2012 Document Reviewed: 10/18/2012  °Exit[employer]® Patient Information ©2014 [employer].  ° ° °

## 2013-08-01 NOTE — Anesthesia Preprocedure Evaluation (Addendum)
Anesthesia Evaluation  Patient identified by MRN, date of birth, ID band Patient awake    Reviewed: Allergy & Precautions, H&P , NPO status , Patient's Chart, lab work & pertinent test results  Airway       Dental   Pulmonary Current Smoker, former smoker,          Cardiovascular     Neuro/Psych PSYCHIATRIC DISORDERS Anxiety    GI/Hepatic (+)     substance abuse  marijuana use,   Endo/Other    Renal/GU      Musculoskeletal  (+) Arthritis -,   Abdominal   Peds  Hematology   Anesthesia Other Findings   Reproductive/Obstetrics                          Anesthesia Physical Anesthesia Plan  ASA: II  Anesthesia Plan: General   Post-op Pain Management:    Induction: Intravenous  Airway Management Planned: Nasal ETT and Oral ETT  Additional Equipment:   Intra-op Plan:   Post-operative Plan: Extubation in OR  Informed Consent: I have reviewed the patients History and Physical, chart, labs and discussed the procedure including the risks, benefits and alternatives for the proposed anesthesia with the patient or authorized representative who has indicated his/her understanding and acceptance.     Plan Discussed with:   Anesthesia Plan Comments:         Anesthesia Quick Evaluation

## 2013-08-01 NOTE — Progress Notes (Signed)
08/01/2013  Patient:            Mason [name] Date of Birth:  1965-09-17 MRN:                [id number]  BP 169/96  Pulse 86  Temp(Src) 97.3 F (36.3 C)  Resp 13  Wt 329 lb 9.4 oz (149.5 kg)  SpO2 100%  I was paged and talked with PACU nurse re: patient disposition. Patient is still drowsy and unable to be weaned from oxygen via mask without drop in oxygen saturations. Will contact [facility] to arrange for overnight observation.  Dr. Enrique Sack [phone]

## 2013-08-02 ENCOUNTER — Telehealth: Payer: Self-pay | Admitting: Hematology and Oncology

## 2013-08-02 DIAGNOSIS — C099 Malignant neoplasm of tonsil, unspecified: Secondary | ICD-10-CM

## 2013-08-02 DIAGNOSIS — K08109 Complete loss of teeth, unspecified cause, unspecified class: Secondary | ICD-10-CM

## 2013-08-02 LAB — BASIC METABOLIC PANEL
BUN: 12 mg/dL (ref 6–23)
CO2: 24 mEq/L (ref 19–32)
Calcium: 9.1 mg/dL (ref 8.4–10.5)
Chloride: 99 mEq/L (ref 96–112)
Creatinine, Ser: 1.12 mg/dL (ref 0.50–1.35)
GFR calc Af Amer: >90 mL/min (ref >90)
GFR calc non Af Amer: 78 mL/min — ABNORMAL LOW (ref >90)
GLUCOSE: 101 mg/dL — AB (ref 70–99)
Potassium: 3.8 mEq/L (ref 3.7–5.3)
SODIUM: 137 meq/L (ref 137–147)

## 2013-08-02 LAB — CBC
HEMATOCRIT: 40 % (ref 39.0–52.0)
HEMOGLOBIN: 13 g/dL (ref 13.0–17.0)
MCH: 29.6 pg (ref 26.0–34.0)
MCHC: 32.5 g/dL (ref 30.0–36.0)
MCV: 91.1 fL (ref 78.0–100.0)
Platelets: 235 10*3/uL (ref 150–400)
RBC: 4.39 MIL/uL (ref 4.22–5.81)
RDW: 13.2 % (ref 11.5–15.5)
WBC: 9.3 10*3/uL (ref 4.0–10.5)

## 2013-08-02 MED ORDER — AMOXICILLIN-POT CLAVULANATE 875-125 MG PO TABS: 1.0000 | ORAL_TABLET | Freq: Two times a day (BID) | ORAL | Status: DC

## 2013-08-02 MED ORDER — PNEUMOCOCCAL VAC POLYVALENT 25 MCG/0.5ML IJ INJ: 0.5000 mL | INJECTION | INTRAMUSCULAR | Status: DC

## 2013-08-02 MED ORDER — AMLODIPINE BESYLATE 2.5 MG PO TABS: 2.5000 mg | ORAL_TABLET | Freq: Every day | ORAL | Status: DC

## 2013-08-02 NOTE — Telephone Encounter (Signed)
s/w Mason [name] in [place] re moving 4/28 bx sooner. pt now schedule for 4/21 @ [facility] to arrive @ 8:45am - npo after midnight and will need a driver. s/w pt today changes. pt given new d/t/location and instruction. appt moved to a sooner date per staff message from [place].

## 2013-08-02 NOTE — Progress Notes (Signed)
POST OPERATIVE NOTE: Post op day #1  08/02/2013 [name] [id number]  VITALS: BP 140/88  Pulse 89  Temp(Src) 98.2 F (36.8 C) (Oral)  Resp 19  Wt 329 lb 5.9 oz (149.4 kg)  SpO2 99%  LABS:  Lab Results  Component Value Date   WBC 9.3 08/02/2013   HGB 13.0 08/02/2013   HCT 40.0 08/02/2013   MCV 91.1 08/02/2013   PLT 235 08/02/2013   BMET    Component Value Date/Time   NA 137 08/02/2013 0340   K 3.8 08/02/2013 0340   CL 99 08/02/2013 0340   CO2 24 08/02/2013 0340   GLUCOSE 101* 08/02/2013 0340   BUN 12 08/02/2013 0340   BUN 14.6 07/24/2013 1022   CREATININE 1.12 08/02/2013 0340   CREATININE 1.4* 07/24/2013 1022   CALCIUM 9.1 08/02/2013 0340   GFRNONAA 78* 08/02/2013 0340   GFRAA >90 08/02/2013 0340    No results found for this basename: INR, PROTIME   No results found for this basename: PTT    Patient is status post multiple extractions of [phone] with alveoloplasty, pre-prosthetic surgery, and gross debridement of teeth.  SUBJECTIVE: The patient currently denies significant dental pain. Patient had experienced some postoperative pain that has lessened over the last 24 hours. Patient denies having any acute bleeding or problems in the mouth.   EXAM: There is no sign of infection, heme, or ooze. Sutures are intact. Clots are present. Oral hygiene is acceptable. Patient is much more awake and alert and oriented to person, place, and time. Generalized primary closure of the mandibular extraction sites. Patient will need to heal in by secondary intention involving the upper right and upper left quadrants. Patient maintaining normal oxygen saturation with room air.    ASSESSMENT: Post operative course is consistent with dental procedures performed in the operating room.   PLAN.  1. Patient to start salt water rinses every 2 hours while awake today at 9 AM 2. Patient to advance diet as tolerated. 3. Patient return to dental medicine on Monday-08/05/2013 for insertion  of fluoride trays and scatter protection devices. 4. Patient to return to dental medicine for suture removal on 08/12/2013 5.  5. Patient to call problems arise.   [name], DDS

## 2013-08-02 NOTE — Progress Notes (Signed)
Utilization review completed- retro 

## 2013-08-02 NOTE — Discharge Summary (Signed)
Physician Discharge Summary  Mason [name]:[id number] DOB: December 13, 1965 DOA: 08/01/2013  PCP: Mason Chessman, MD  Admit date: 08/01/2013 Discharge date: 08/02/2013  Time spent: 35 minutes  Recommendations for Outpatient Follow-up:  1. Need follow up BP. 2. Need follow up with oncologist  Discharge Diagnoses:    Respiratory failure, acute   Tonsillar cancer   Chronic periodontitis   HYpertension  Discharge Condition: stable.   Diet recommendation: Diet as tolerated.   Filed Weights   08/01/13 0629 08/02/13 0400  Weight: 149.5 kg (329 lb 9.4 oz) 149.4 kg (329 lb 5.9 oz)    History of present illness:  Mason [name] is a 48 y.o. 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 He underwent multiple extraction of tooth numbers 1, 15, 17, 31, and 32 , 4 Quadrants of alveoloplasty , Gross debridement of remaining dentition, Mandibular left lingual torus reduction by Dr Enrique Sack 4-16. Patient unable to be wean of Oxygen. He is requiring 3 L at this time. He is lethargic, wake up to answer questions. He is following commands. He denies dyspnea, chest pain or abdominal pain. He falls sleep in between conversation.    Hospital Course:  1-Acute hypoxic respiratory failure; Patient was admitted  to step down for close observation. Might be related to hypoventilation, sedation.  Chest x ray with atelectasis. He will be discharge on Augmentin. Patient oxygen saturation 100 % on Room air.  Patient stable to be discharge today.   2-Squamous cell carcinoma right tonsil;  he follows at [facility] and with Dr Alvy Bimler.   3-Encephalopathy; patient was lethargic. Likely secondary to anesthesia and opioids. Resolved.   4-Elevated Blood pressure; no prior history of hypertension. BP elevated in the 160 to 14 range. Will start low dose Norvasc.   5-pre -chemoradiation therapy dental protocol; S/P  multiple tooth extraction; per Dr Enrique Sack.   6-DVT prophylaxis; SCD.    Procedures: multiple extraction of tooth numbers 1, 15, 17, 31, and 32 , 4 Quadrants of alveoloplasty , Gross debridement of remaining dentition, Mandibular left lingual torus reduction by Dr Enrique Sack 4-16   Consultations:  none  Discharge Exam: Filed Vitals:   08/02/13 0700  BP:   Pulse:   Temp: 98.2 F (36.8 C)  Resp:     General: No distress.  Cardiovascular: S 1, S 2 RRR Respiratory: CTA  Discharge Instructions You were cared for by a hospitalist during your hospital stay. If you have any questions about your discharge medications or the care you received while you were in the hospital after you are discharged, you can call the unit and asked to speak with the hospitalist on call if the hospitalist that took care of you is not available. Once you are discharged, your primary care physician will handle any further medical issues. Please note that NO REFILLS for any discharge medications will be authorized once you are discharged, as it is imperative that you return to your primary care physician (or establish a relationship with a primary care physician if you do not have one) for your aftercare needs so that they can reassess your need for medications and monitor your lab values.  Discharge Orders   Future Appointments Provider Department Dept Phone   08/05/2013 10:00 AM THAO VANOVER, DDS [place] [phone]   08/05/2013 2:00 PM Eppie Gibson, MD [facility] [phone]   Joint Appt [facility] [place] [facility] [phone]   08/13/2013 7:00 AM [facility] [place] [phone]   08/13/2013 9:00 AM [facility] [phone]   You will be contacted for your instructions prior to the day of your exam   Future Orders Complete By Expires    Diet - low sodium heart healthy  As directed    Discharge patient  As directed    Gauze  As directed    Increase activity slowly  As directed        Medication List         amLODipine 2.5 MG tablet  Commonly known as:  NORVASC  Take 1 tablet (2.5 mg total) by mouth daily.     amoxicillin-clavulanate 875-125 MG per tablet  Commonly known as:  AUGMENTIN  Take 1 tablet by mouth 2 (two) times daily.     oxyCODONE-acetaminophen 5-325 MG per tablet  Commonly known as:  PERCOCET  Take one or two tablets by mouth every 4-6 hours as needed for pain.       No Known Allergies     Follow-up Information   Follow up with [name], DDS On 08/05/2013. (10:00 am)    Specialty:  Dentistry   Contact information:   [address] [address] [phone]        The results of significant diagnostics from this hospitalization (including imaging, microbiology, ancillary and laboratory) are listed below for reference.    Significant Diagnostic Studies: Ct Soft Tissue Neck W Contrast  07/25/2013   CLINICAL DATA:  Newly diagnosed squamous cell carcinoma. Recurrent hemoptysis was the initial presentation. Sensation of sore throat and swelling in the right neck. 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  EXAM: CT NECK WITH CONTRAST  TECHNIQUE: Multidetector CT imaging of the neck was performed using the standard protocol following the bolus administration of intravenous contrast.  CONTRAST:  159mL OMNIPAQUE IOHEXOL 300 MG/ML  SOLN  COMPARISON:  PET scan to be performed later today.  FINDINGS: There is a 35 x 37 x 44 mm right palatine tonsillar mass consistent HPV positive squamous cell carcinoma. The mass contacts the medial pterygoid but does not clearly invade the masticator space. Two large ipsilateral lymph nodes are present. Level IIB lymph node on the right measures 31 x 43 mm cross-section, displacing sternocleidomastoid laterally and carotid sheath vessels  medially. A right level III lymph node measures 35 x 41 mm cross-section. Both or characterize by enhancing peripheral more solid tissue and a central hypo attenuating cyst like fluid or more necrotic tissue. Left level II a node on image 47 measures 9 mm short axis; correlate with PET imaging.  No lung apex lesion or nodularity. No osseous lesions. Spondylosis. No carotid calcification. No sinus disease. Major and minor salivary glands unremarkable. Normal larynx. No mastoid effusion.  IMPRESSION: 35 x 37 x 44 mm right palatine tonsil mass consistent with squamous cell carcinoma.  Ipsilateral right neck level II and III adenopathy. Equivocal left level IIA node.   Electronically Signed   By: Mason Flatten M.D.   On: 07/25/2013 08:52   Nm Pet Image Initial (pi) Skull Base To Thigh  07/25/2013   CLINICAL DATA:  Initial treatment strategy for tonsillar squamous cell carcinoma.  EXAM: NUCLEAR MEDICINE PET SKULL BASE TO THIGH  TECHNIQUE: 15.2 mCi F-18 FDG was injected intravenously. Full-ring PET imaging was performed from the skull base to thigh after the radiotracer.  CT data was obtained and used for attenuation correction and anatomic localization.  FASTING BLOOD GLUCOSE:  Value: 98 mg/dl  COMPARISON:  Neck CT 07/25/2013  FINDINGS: NECK  The large right tonsillar mass demonstrates marked FDG uptake with SUV max of 22.4. The large level 2 and level 3 necrotic appearing lymph nodes are also hypermetabolic with SUV max of 12.4 and 7.1 respectively.  The left-sided level 2 lymph node just deep to the sternocleidomastoid muscle demonstrates FDG uptake with SUV max 3.0.  CHEST  There is a 14 mm right axillary lymph node on image number 60 which has an SUV max of 2.93. Some muscular activity is noted around the left shoulder. No mediastinal or hilar lymphadenopathy and no pulmonary nodules.  ABDOMEN/PELVIS  No abnormal hypermetabolic activity within the liver, pancreas, adrenal glands, or spleen. No hypermetabolic lymph  nodes in the abdomen or pelvis.  SKELETON  No focal hypermetabolic activity to suggest skeletal metastasis.  IMPRESSION: The large right tonsillar mass and large necrotic right-sided level 2 and level 3 lymph nodes are all hypermetabolic consistent with neoplasm. There is also a contralateral left-sided level 2 lymph node which is hypermetabolic.  14 mm right axillary lymph node this mildly hypermetabolic and likely a metastatic lymph node.  No findings for metastatic disease involving the chest, abdomen or pelvis.   Electronically Signed   By: Kalman Jewels M.D.   On: 07/25/2013 10:29   Dg Chest Port 1 View  08/01/2013   CLINICAL DATA:  Hypoxia after tooth extraction.  EXAM: PORTABLE CHEST - 1 VIEW  COMPARISON:  PET-CT 07/25/2013  FINDINGS: Single view of the chest demonstrates low lung volumes with elevation of the right hemidiaphragm. Few densities at the right lung base are suggestive for atelectasis. Heart size is normal. The trachea is midline and negative for a pneumothorax.  IMPRESSION: Low lung volumes with right basilar atelectasis.   Electronically Signed   By: Markus Daft M.D.   On: 08/01/2013 14:17    Microbiology: Recent Results (from the past 240 hour(s))  MRSA PCR SCREENING     Status: None   Collection Time    08/01/13  6:51 PM      Result Value Ref Range Status   MRSA by PCR NEGATIVE  NEGATIVE Final   Comment:            The GeneXpert MRSA Assay (FDA     approved for NASAL specimens     only), is one component of a     comprehensive MRSA colonization     surveillance program. It is not     intended to diagnose MRSA     infection nor to guide or     monitor treatment for     MRSA infections.     Labs: Basic Metabolic Panel:  Recent Labs Lab 07/31/13 1140 08/01/13 1410 08/02/13 0340  NA 143 136* 137  K 4.6 5.3 3.8  CL 107 101 99  CO2 23 20 24   GLUCOSE 93 129* 101*  BUN 16 14 12   CREATININE 1.49* 1.14 1.12  CALCIUM 9.4 9.2 9.1   Liver Function  Tests:  Recent Labs Lab 07/31/13 1140  AST 41*  ALT 55*  ALKPHOS 59  BILITOT 0.3  PROT 7.4  ALBUMIN 4.0   No results found for this basename: LIPASE, AMYLASE,  in the last 168 hours No results found for this basename: AMMONIA,  in the last 168 hours CBC:  Recent Labs Lab 07/31/13 1140 08/01/13 1410  08/02/13 0340  WBC 8.7 13.3* 9.3  HGB 13.9 14.2 13.0  HCT 42.3 43.0 40.0  MCV 90.6 90.7 91.1  PLT 265 281 235   Cardiac Enzymes: No results found for this basename: CKTOTAL, CKMB, CKMBINDEX, TROPONINI,  in the last 168 hours BNP: BNP (last 3 results) No results found for this basename: PROBNP,  in the last 8760 hours CBG: No results found for this basename: GLUCAP,  in the last 168 hours     Signed:  Bannon Giammarco A Briton Sellman  [facility] 08/02/2013, 7:41 AM

## 2013-08-02 NOTE — Care Management Note (Signed)
    Page 1 of 1   08/02/2013     11:46:49 AM CARE MANAGEMENT NOTE 08/02/2013  Patient:  Mason [name], Mason [name]   Account Number:  [id number]  Date Initiated:  08/02/2013  Documentation initiated by:  Marvetta Gibbons  Subjective/Objective Assessment:   Pt admitted for tonsil cancer     Action/Plan:   PTA pt lived at home   Anticipated DC Date:  08/02/2013   Anticipated DC Plan:  [facility]  CM consult      Choice offered to / List presented to:             Status of service:  Completed, signed off Medicare Important Message given?   (If response is "NO", the following Medicare IM given date fields will be blank) Date Medicare IM given:   Date Additional Medicare IM given:    Discharge Disposition:  HOME/SELF CARE  Per UR Regulation:  Reviewed for med. necessity/level of care/duration of stay  If discussed at [place] of Stay Meetings, dates discussed:    Comments:  08/02/12- 1000- Marvetta Gibbons RN, BSN [phone] Referral received for PCP needs= spoke with pt at bedside- per conversation pt has already gotten orange card at the [facility] and is in the process of establishing PCP there- call made to clinic and f/u appointment made with the NP for next Friday April 24 at 2:00 pm. Info placed on pt's AVS and verbally given to pt. Pt has a 75% discount with [employer].

## 2013-08-02 NOTE — Progress Notes (Signed)
Pt discharged per MD order. Discharge instructions reviewed and all questions answered.

## 2013-08-02 NOTE — Anesthesia Postprocedure Evaluation (Signed)
  Anesthesia Post-op Note  Patient: Mason [name]  Procedure(s) Performed: Procedure(s): Extraction of tooth #'s 1,15,17,31, 32 with alveoloplasty, mandibular left torus reduction, and gross debridement of remaining teeth. (N/A)  Patient Location: PACU  Anesthesia Type:General  Level of Consciousness: awake, alert , oriented and patient cooperative  Airway and Oxygen Therapy: Patient Spontanous Breathing  Post-op Pain: mild  Post-op Assessment: Post-op Vital signs reviewed, Patient's Cardiovascular Status Stable, Respiratory Function Stable, Patent Airway, No signs of Nausea or vomiting and Pain level controlled  Post-op Vital Signs: stable  Last Vitals:  Filed Vitals:   08/02/13 0400  BP:   Pulse:   Temp: 36.8 C  Resp:     Complications: No apparent anesthesia complications

## 2013-08-05 ENCOUNTER — Ambulatory Visit (HOSPITAL_COMMUNITY): Payer: Medicaid Other | Admitting: Dentistry

## 2013-08-05 ENCOUNTER — Other Ambulatory Visit: Payer: Self-pay | Admitting: Radiology

## 2013-08-05 ENCOUNTER — Encounter: Payer: Self-pay | Admitting: *Deleted

## 2013-08-05 ENCOUNTER — Ambulatory Visit
Admission: RE | Admit: 2013-08-05 | Discharge: 2013-08-05 | Disposition: A | Discharge status: Home / Self Care | Payer: Medicaid Other | Source: Ambulatory Visit | Attending: Radiation Oncology | Admitting: Radiation Oncology

## 2013-08-05 ENCOUNTER — Encounter (HOSPITAL_COMMUNITY): Payer: Self-pay | Admitting: Dentistry

## 2013-08-05 ENCOUNTER — Ambulatory Visit: Payer: Medicaid Other

## 2013-08-05 VITALS — BP 159/85 | HR 98 | Temp 98.3°F

## 2013-08-05 DIAGNOSIS — Z0189 Encounter for other specified special examinations: Secondary | ICD-10-CM

## 2013-08-05 DIAGNOSIS — C099 Malignant neoplasm of tonsil, unspecified: Secondary | ICD-10-CM

## 2013-08-05 DIAGNOSIS — Z463 Encounter for fitting and adjustment of dental prosthetic device: Secondary | ICD-10-CM

## 2013-08-05 DIAGNOSIS — K08109 Complete loss of teeth, unspecified cause, unspecified class: Secondary | ICD-10-CM

## 2013-08-05 DIAGNOSIS — Z510 Encounter for antineoplastic radiation therapy: Secondary | ICD-10-CM | POA: Diagnosis present

## 2013-08-05 DIAGNOSIS — K08199 Complete loss of teeth due to other specified cause, unspecified class: Secondary | ICD-10-CM

## 2013-08-05 DIAGNOSIS — K08409 Partial loss of teeth, unspecified cause, unspecified class: Secondary | ICD-10-CM

## 2013-08-05 MED ORDER — SODIUM FLUORIDE 1.1 % DT GEL: DENTAL | Status: DC

## 2013-08-05 NOTE — Progress Notes (Signed)
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 An Aquaplast head and shoulder mask was custom fitted to the patient's anatomy. High-resolution CT axial imaging was obtained of the head and neck without contrast due to kidney function. I verified that the quality of the imaging is good for treatment planning. 1 Medically Necessary Treatment Device was fabricated and supervised by me: Aquaplast mask.   Treatment planning note I plan to treat the patient with IMRT. I plan to treat the patient's tumor and bilateral neck nodes. I plan to treat to a total dose of 70 Gray in 35  Fractions; if axillary biopsy is positive, I may consider including right axilla in fields to cover oligometastatic disease.  IMRT planning Note  IMRT is an important modality to deliver adequate dose to the patient's at risk tissues while sparing the patient's normal structures, including the: esophagus, parotid tissue, mandible, brain stem, spinal cord, oral cavity, brachial plexus.  This justifies the use of IMRT in the patient's treatment.   Special Treatment Procedure Note:  The patient will be receiving chemotherapy concurrently. Chemotherapy heightens the risk of side effects. I have considered this during the patient's treatment planning process and will monitor the patient accordingly for side effects on a weekly basis. Concurrent chemotherapy increases the complexity of this patient's treatment and therefore this constitutes a special treatment procedure.  -----------------------------------  Eppie Gibson, MD

## 2013-08-05 NOTE — Patient Instructions (Addendum)
PLAN: 1. Patient to continue to use salt water rinses as needed to aid healing. 2. Patient to brush after meals and at bedtime. 3. Patient to start using fluoride in fluoride trays at bedtime. Prescription for FluoroSHIELD was sent to the [facility] long outpatient pharmacy. 4. Patient to radiation oncology today for simulation appointment. 5. Patient to call once radiation therapy is started. Patient will then be scheduled for a periodic oral examination during radiation therapy. 6. Patient is to return to clinic on 08/12/2048 for suture removal.   [name], DDS  FLUORIDE TRAYS PATIENT INSTRUCTIONS    Obtain prescription from the pharmacy.  Don't be surprised if it needs to be ordered.  Be sure to let the pharmacy know when you are close to needing a new refill for them to have it ready for you without interruption of Fluoride use.  The best time to use your Fluoride is before bed time.  You must brush your teeth very well and floss before using the Fluoride in order to get the best use out of the Fluoride treatments.  Place 1 drop of Fluoride gel per tooth in the tray.  Place the tray on your lower teeth and your upper teeth.  Make sure the trays are seated all the way.  Remember, they only fit one way on your teeth.  Insert for 5 full minutes.  At the end of the 5 minutes, take the trays out.  SPIT OUT excess. .  Do NOT rinse your mouth!  Do NOT eat or drink after treatments for at least 30 minutes.  This is why the best time for your treatments is before bedtime.  Clean the inside of your Fluoride trays using COLD WATER and a toothbrush.  In order to keep your Trays from discoloring and free from odors, soak them overnight in denture cleaners such as Efferdent.  Do not use bleach or non denture products.  Store the trays in a safe dry place AWAY from any heat until your next treatment.  If anything happens to your Fluoride trays, or they don't fit as well after any  dental work, please let us know as soon as possible.  TRISMUS  Trismus is a condition where the jaw does not allow the mouth to open as wide as it usually does.  This can happen almost suddenly, or in other cases the process is so slow, it is hard to notice it-until it is too far along.  When the jaw joints and/or muscles have been exposed to radiation treatments, the onset of Trismus is very slow.  This is because the muscles are losing their stretching ability over a long period of time, as long as 2 YEARS after the end of radiation.  It is therefore important to exercise these muscles and joints.  TRISMUS EXERCISES   Stack of tongue depressors measuring the same or a little less than the last documented MIO (Maximum Interincisal Opening).  Secure them with a rubber band on both ends.  Place the stack in the patient's mouth, supporting the other end.  Allow 30 seconds for muscle stretching.  Rest for a few seconds.  Repeat 3-5 times  For all radiation patients, this exercise is recommended in the mornings and evenings unless otherwise instructed.  The exercise should be done for a period of 2 YEARS after the end of radiation.  MIO should be checked routinely on recall dental visits by the general dentist or the hospital dentist.  The patient is  advised to report any changes, soreness, or difficulties encountered when doing the exercises.

## 2013-08-05 NOTE — Progress Notes (Signed)
POST OPERATIVE NOTE: Post op Day #4  08/05/2013 [facility] [id number]  VITALS: BP 159/85  Pulse 98  Temp(Src) 98.3 F (36.8 C) (Oral)  LABS:  Lab Results  Component Value Date   WBC 9.3 08/02/2013   HGB 13.0 08/02/2013   HCT 40.0 08/02/2013   MCV 91.1 08/02/2013   PLT 235 08/02/2013   BMET    Component Value Date/Time   NA 137 08/02/2013 0340   K 3.8 08/02/2013 0340   CL 99 08/02/2013 0340   CO2 24 08/02/2013 0340   GLUCOSE 101* 08/02/2013 0340   BUN 12 08/02/2013 0340   BUN 14.6 07/24/2013 1022   CREATININE 1.12 08/02/2013 0340   CREATININE 1.4* 07/24/2013 1022   CALCIUM 9.1 08/02/2013 0340   GFRNONAA 78* 08/02/2013 0340   GFRAA >90 08/02/2013 0340    No results found for this basename: INR, PROTIME   No results found for this basename: PTT     Patient is status post extraction of 5 teeth with alveoloplasty and prosthetic surgery and gross debridement of remaining dentition in the OR on 08/01/2013. Patient was kept overnight for observation due to acute respiratory failure and decreased oxygen saturations. Patient discharged on 08/02/2013. Patient now presents for evaluation of healing and to insert upper and lower fluoride trays and scatter protection devices before simulation appointment with Dr. Isidore Moos.  SUBJECTIVE: Patient with minimal oral discomfort. Patient is using pain medicine as needed. He is using salt water rinses as needed.  EXAM:  There is no sign of infection, heme, or ooze. Sutures are intact. Clots were present. Patient is relatively good oral hygiene but oral hygiene instruction was provided.  PROCEDURE: The patient was given a chlorhexidine gluconate rinse for 30 seconds.   Fluoride tray and scatter protection devices were tried in and adjusted as needed. [place] Trismus device was fabricated 45 mm using 28 sticks. Postop instructions were provided and a written and verbal format concerning the use and care of appliances. All questions were  answered. Patient to return to clinic for periodic oral examination in the future once radiation therapy has been initiated.. Patient is to call for appointment once radiation therapy starts. Patient to call if questions or problems arise before then.  ASSESSMENT: Post operative course is consistent with dental procedures performed in the OR. Appliances were successfully inserted.  PLAN: 1. Patient to continue to use salt water rinses as needed to aid healing. 2. Patient to brush after meals and at bedtime. 3. Patient to start using fluoride in fluoride trays at bedtime. Prescription for FluoroSHIELD was sent to the [facility] long outpatient pharmacy. 4. Patient to radiation oncology today for simulation appointment. 5. Patient to call once radiation therapy is started. Patient will then be scheduled for a periodic oral examination during radiation therapy. 6. Patient is to return to clinic on 08/12/2048 for suture removal.   [name], DDS

## 2013-08-06 ENCOUNTER — Ambulatory Visit (HOSPITAL_COMMUNITY)
Admission: RE | Admit: 2013-08-06 | Discharge: 2013-08-06 | Disposition: A | Discharge status: Home / Self Care | Payer: Medicaid Other | Source: Ambulatory Visit | Attending: Hematology and Oncology | Admitting: Hematology and Oncology

## 2013-08-06 ENCOUNTER — Encounter (HOSPITAL_COMMUNITY): Payer: Self-pay

## 2013-08-06 ENCOUNTER — Telehealth: Payer: Self-pay | Admitting: *Deleted

## 2013-08-06 ENCOUNTER — Ambulatory Visit (HOSPITAL_COMMUNITY): Payer: Medicaid Other

## 2013-08-06 VITALS — BP 142/85 | HR 86 | Temp 97.9°F | Resp 18

## 2013-08-06 DIAGNOSIS — Z87891 Personal history of nicotine dependence: Secondary | ICD-10-CM | POA: Insufficient documentation

## 2013-08-06 DIAGNOSIS — Z79899 Other long term (current) drug therapy: Secondary | ICD-10-CM | POA: Insufficient documentation

## 2013-08-06 DIAGNOSIS — R599 Enlarged lymph nodes, unspecified: Secondary | ICD-10-CM | POA: Insufficient documentation

## 2013-08-06 DIAGNOSIS — C099 Malignant neoplasm of tonsil, unspecified: Secondary | ICD-10-CM | POA: Diagnosis not present

## 2013-08-06 DIAGNOSIS — M1710 Unilateral primary osteoarthritis, unspecified knee: Secondary | ICD-10-CM | POA: Insufficient documentation

## 2013-08-06 DIAGNOSIS — IMO0002 Reserved for concepts with insufficient information to code with codable children: Secondary | ICD-10-CM

## 2013-08-06 DIAGNOSIS — R590 Localized enlarged lymph nodes: Secondary | ICD-10-CM

## 2013-08-06 DIAGNOSIS — M1610 Unilateral primary osteoarthritis, unspecified hip: Secondary | ICD-10-CM | POA: Diagnosis not present

## 2013-08-06 DIAGNOSIS — G8929 Other chronic pain: Secondary | ICD-10-CM | POA: Insufficient documentation

## 2013-08-06 LAB — CBC
HEMATOCRIT: 40.8 % (ref 39.0–52.0)
HEMOGLOBIN: 13.9 g/dL (ref 13.0–17.0)
MCH: 29.8 pg (ref 26.0–34.0)
MCHC: 34.1 g/dL (ref 30.0–36.0)
MCV: 87.4 fL (ref 78.0–100.0)
Platelets: 212 10*3/uL (ref 150–400)
RBC: 4.67 MIL/uL (ref 4.22–5.81)
RDW: 12.8 % (ref 11.5–15.5)
WBC: 8.7 10*3/uL (ref 4.0–10.5)

## 2013-08-06 LAB — APTT: aPTT: 26 seconds (ref 24–37)

## 2013-08-06 LAB — PROTIME-INR
INR: 0.94 (ref 0.00–1.49)
Prothrombin Time: 12.4 seconds (ref 11.6–15.2)

## 2013-08-06 MED ORDER — FENTANYL CITRATE 0.05 MG/ML IJ SOLN
INTRAMUSCULAR | Status: AC
  Filled 2013-08-06: qty 6

## 2013-08-06 MED ORDER — SODIUM CHLORIDE 0.9 % IV SOLN
INTRAVENOUS | Status: DC
  Administered 2013-08-06: 10:00:00 via INTRAVENOUS

## 2013-08-06 MED ORDER — FENTANYL CITRATE 0.05 MG/ML IJ SOLN
INTRAMUSCULAR | Status: AC | PRN
  Administered 2013-08-06: 25 ug via INTRAVENOUS

## 2013-08-06 MED ORDER — MIDAZOLAM HCL 2 MG/2ML IJ SOLN
INTRAMUSCULAR | Status: AC
  Filled 2013-08-06: qty 6

## 2013-08-06 MED ORDER — HYDROCODONE-ACETAMINOPHEN 5-325 MG PO TABS
1.0000 | ORAL_TABLET | ORAL | Status: DC | PRN
  Filled 2013-08-06: qty 2

## 2013-08-06 MED ORDER — MIDAZOLAM HCL 2 MG/2ML IJ SOLN
INTRAMUSCULAR | Status: AC | PRN
  Administered 2013-08-06: 0.5 mg via INTRAVENOUS

## 2013-08-06 NOTE — Discharge Instructions (Signed)
Needle Biopsy °Care After °These instructions give you information on caring for yourself after your procedure. Your doctor may also give you more specific instructions. Call your doctor if you have any problems or questions after your procedure. °HOME CARE °· Rest for 4 hours after your biopsy, except for getting up to go to the bathroom or as told. °· Keep the places where the needles were put in clean and dry. °· Do not put powder or lotion on the sites. °· Do not shower until 24 hours after the test. Remove all bandages (dressings) before showering. °· Remove all bandages at least once every day. Gently clean the sites with soap and water. Keep putting a new bandage on until the skin is closed. °Finding out the results of your test °Ask your doctor when your test results will be ready. Make sure you follow up and get the test results. °GET HELP RIGHT AWAY IF:  °· You have shortness of breath or trouble breathing. °· You have pain or cramping in your belly (abdomen). °· You feel sick to your stomach (nauseous) or throw up (vomit). °· Any of the places where the needles were put in: °· Are puffy (swollen) or red. °· Are sore or hot to the touch. °· Are draining yellowish-white fluid (pus). °· Are bleeding after 10 minutes of pressing down on the site. Have someone keep pressing on any place that is bleeding until you see a doctor. °· You have any unusual pain that will not stop. °· You have a fever. °If you go to the emergency room, tell the nurse that you had a biopsy. Take this paper with you to show the nurse. °MAKE SURE YOU:  °· Understand these instructions. °· Will watch your condition. °· Will get help right away if you are not doing well or get worse. °Document Released: 03/17/2008 Document Revised: 06/27/2011 Document Reviewed: 03/17/2008 °ExitCare® Patient Information ©2014 [employer]. °Moderate Sedation, Adult °Moderate sedation is given to help you relax or even sleep through a procedure. You may  remain sleepy, be clumsy, or have poor balance for several hours following this procedure. Arrange for a responsible adult, family member, or friend to take you home. A responsible adult should stay with you for at least 24 hours or until the medicines have worn off. °· Do not participate in any activities where you could become injured for the next 24 hours, or until you feel normal again. Do not: °· Drive. °· Swim. °· Ride a bicycle. °· Operate heavy machinery. °· Cook. °· Use power tools. °· Climb ladders. °· Work at heights. °· Do not make important decisions or sign legal documents until you are improved. °· Vomiting may occur if you eat too soon. When you can drink without vomiting, try water, juice, or soup. Try solid foods if you feel little or no nausea. °· Only take over-the-counter or prescription medications for pain, discomfort, or fever as directed by your caregiver.If pain medications have been prescribed for you, ask your caregiver how soon it is safe to take them. °· Make sure you and your family fully understands everything about the medication given to you. Make sure you understand what side effects may occur. °· You should not drink alcohol, take sleeping pills, or medications that cause drowsiness for at least 24 hours. °· If you smoke, do not smoke alone. °· If you are feeling better, you may resume normal activities 24 hours after receiving sedation. °· Keep all appointments as scheduled.   Follow all instructions. °· Ask questions if you do not understand. °SEEK MEDICAL CARE IF:  °· Your skin is pale or bluish in color. °· You continue to feel sick to your stomach (nauseous) or throw up (vomit). °· Your pain is getting worse and not helped by medication. °· You have bleeding or swelling. °· You are still sleepy or feeling clumsy after 24 hours. °SEEK IMMEDIATE MEDICAL CARE IF:  °· You develop a rash. °· You have difficulty breathing. °· You develop any type of allergic problem. °· You have a  fever. °Document Released: 12/28/2000 Document Revised: 06/27/2011 Document Reviewed: 12/10/2012 °ExitCare® Patient Information ©2014 [employer]. ° °

## 2013-08-06 NOTE — H&P (Signed)
Chief Complaint: "I am here for a biopsy." Referring Physician: Dr. Alvy Bimler HPI: Mason [name] is an 48 y.o. male with history of right tonsillar squamous cell carcinoma. PET on 07/25/13 revealed 14 mm right axillary lymph node this mildly hypermetabolic and likely a metastatic lymph node. IR received request for image guided biopsy with moderate sedation. He denies any chest pain, shortness of breath or palpitations. He denies any active signs of bleeding or excessive bruising. He denies any recent fever or chills. The patient denies any history of sleep apnea or chronic oxygen use. He has previously tolerated sedation, but did have to stay an additional evening for monitoring secondary to low oxygen saturation.    Past Medical History:  Past Medical History  Diagnosis Date  . 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 Knee pain, chronic   . Anxiety     mild new dx  . Arthritis     knees,hips  . Concussion     Hx: in high school x 2    Past Surgical History:  Past Surgical History  Procedure Laterality Date  . Multiple extractions with alveoloplasty N/A 08/01/2013    Procedure: Extraction of tooth #'s 1,15,17,31, 32 with alveoloplasty, mandibular left torus reduction, and gross debridement of remaining teeth.;  Surgeon: [name], DDS;  Location: [facility];  Service: Oral Surgery;  Laterality: N/A;    Family History:  Family History  Problem Relation Age of Onset  . CVA    . Diabetes    . Heart attack    . Arthritis Father   . Arthritis Mother     Social History:  reports that he quit smoking about 10 years ago. His smoking use included Cigarettes. He has a 20 pack-year smoking history. He has never used smokeless tobacco. He reports that he drinks about 7.2 ounces of alcohol per week. He reports that he uses illicit drugs (Marijuana).  Allergies: No Known Allergies  Medications:   Medication List    ASK your doctor about these medications       amLODipine  2.5 MG tablet  Commonly known as:  NORVASC  Take 1 tablet (2.5 mg total) by mouth daily.     amoxicillin-clavulanate 875-125 MG per tablet  Commonly known as:  AUGMENTIN  Take 1 tablet by mouth 2 (two) times daily. He is to take for 10 days and had it filled on 08/02/13.     oxyCODONE-acetaminophen 5-325 MG per tablet  Commonly known as:  PERCOCET  Take one or two tablets by mouth every 4-6 hours as needed for pain.     sodium fluoride 1.1 % Gel dental gel  Commonly known as:  FLUORISHIELD  Instill one drop into each tooth space of fluoride tray. Place over teeth for 5 minutes. Remove. Spit out excess. Repeat nightly.       Please HPI for pertinent positives, otherwise complete 10 system ROS negative.  Physical Exam: BP 150/94  Pulse 94  Temp(Src) 98.8 F (37.1 C) (Oral)  SpO2 96% There is no weight on file to calculate BMI.  General Appearance:  Alert, cooperative, no distress  Head:  Normocephalic, without obvious abnormality, atraumatic  Neck: Supple, symmetrical, trachea midline  Lungs:   Clear to auscultation bilaterally, no w/r/r, respirations unlabored without use of accessory muscles.  Chest Wall:  No tenderness or deformity  Heart:  Regular rate and rhythm, S1, S2 normal, no murmur, rub or gallop.  Abdomen:   Soft, non-tender,  non distended, (+) BS  Extremities: Extremities normal, atraumatic, no cyanosis or edema  Pulses: 2+ and symmetric  Neurologic: Normal affect, no gross deficits.   Results for orders placed during the hospital encounter of 08/06/13 (from the past 48 hour(s))  APTT     Status: None   Collection Time    08/06/13  9:20 AM      Result Value Ref Range   aPTT 26  24 - 37 seconds  CBC     Status: None   Collection Time    08/06/13  9:20 AM      Result Value Ref Range   WBC 8.7  4.0 - 10.5 K/uL   RBC 4.67  4.22 - 5.81 MIL/uL   Hemoglobin 13.9  13.0 - 17.0 g/dL   HCT 40.8  39.0 - 52.0 %   MCV 87.4  78.0 - 100.0 fL   MCH 29.8  26.0 - 34.0 pg    MCHC 34.1  30.0 - 36.0 g/dL   RDW 12.8  11.5 - 15.5 %   Platelets 212  150 - 400 K/uL  PROTIME-INR     Status: None   Collection Time    08/06/13  9:20 AM      Result Value Ref Range   Prothrombin Time 12.4  11.6 - 15.2 seconds   INR 0.94  0.00 - 1.49   No results found.  Assessment/Plan History of right tonsillar squamous cell cancer Right axillary lymphadenopathy, hypermetabolic on PET Request for image guided right axillary lymph node biopsy with moderate sedation. Patient has been NPO, labs and images reviewed . Risks and Benefits discussed with the patient. All of the patient's questions were answered, patient is agreeable to proceed. Consent signed and in chart.   Hedy Jacob PA-C 08/06/2013, 10:33 AM

## 2013-08-06 NOTE — Telephone Encounter (Signed)
In response to patient's call, I provided clarification of appt location for today's Bx.  Gayleen Orem, RN, BSN, [facility] Head & Neck Oncology Navigator [phone]

## 2013-08-06 NOTE — Procedures (Signed)
R axillary LAN core bx under CT 18g x4 to surg path No complication No blood loss. See complete dictation in [facility].

## 2013-08-06 NOTE — Progress Notes (Signed)
To provide support and encouragement, met with patient during SIM.  He tolerated procedure without difficulty.  Showed him Tomo, explained procedure for arriving and preparing for tmt.  He verbalized understanding.  Continuing navigation as L1 patient (new patient).  Gayleen Orem, RN, BSN, [facility] Head & Neck Oncology Navigator [phone]

## 2013-08-07 ENCOUNTER — Other Ambulatory Visit: Payer: Self-pay | Admitting: Hematology and Oncology

## 2013-08-07 DIAGNOSIS — C099 Malignant neoplasm of tonsil, unspecified: Secondary | ICD-10-CM

## 2013-08-08 ENCOUNTER — Telehealth: Payer: Self-pay | Admitting: Hematology and Oncology

## 2013-08-08 ENCOUNTER — Telehealth: Payer: Self-pay | Admitting: *Deleted

## 2013-08-08 NOTE — Telephone Encounter (Signed)
Message copied by Patton Salles on Thu Aug 08, 2013  9:11 AM ------      Message from: [facility], [place]      Created: Wed Aug 07, 2013  8:00 PM      Regarding: Ct biopsy       Emilya Justen,            Please call pt and let him know the Ct biopsy was negative. I have placed POF for general surgery consult for port and feeding tube, chemo, rtn appt, etc            Thanks ------

## 2013-08-08 NOTE — Telephone Encounter (Signed)
s/w pt re next appt for 5/1 and pt will get new schedule when he comes in. pt also given appt w/dr ramierz @ [facility] for 4/27 @ 4pm. s/w christy @ [facility] and pt will see dr Rosendo Gros and offiice will schedule g-tube and port placement. pt aware.

## 2013-08-08 NOTE — Telephone Encounter (Signed)
Notified patient of results below. Pt pleased.

## 2013-08-09 ENCOUNTER — Ambulatory Visit: Payer: Medicaid Other | Attending: Internal Medicine | Admitting: Internal Medicine

## 2013-08-09 ENCOUNTER — Encounter: Payer: Self-pay | Admitting: Internal Medicine

## 2013-08-09 VITALS — BP 141/94 | HR 78 | Temp 98.9°F | Resp 16 | Ht 79.0 in | Wt 329.0 lb

## 2013-08-09 DIAGNOSIS — Z87891 Personal history of nicotine dependence: Secondary | ICD-10-CM | POA: Insufficient documentation

## 2013-08-09 DIAGNOSIS — C099 Malignant neoplasm of tonsil, unspecified: Secondary | ICD-10-CM | POA: Diagnosis not present

## 2013-08-09 DIAGNOSIS — Z09 Encounter for follow-up examination after completed treatment for conditions other than malignant neoplasm: Secondary | ICD-10-CM | POA: Diagnosis not present

## 2013-08-09 DIAGNOSIS — IMO0002 Reserved for concepts with insufficient information to code with codable children: Secondary | ICD-10-CM | POA: Diagnosis not present

## 2013-08-09 DIAGNOSIS — M1710 Unilateral primary osteoarthritis, unspecified knee: Secondary | ICD-10-CM

## 2013-08-09 DIAGNOSIS — Z7189 Other specified counseling: Secondary | ICD-10-CM | POA: Diagnosis not present

## 2013-08-09 DIAGNOSIS — Z7689 Persons encountering health services in other specified circumstances: Secondary | ICD-10-CM

## 2013-08-09 DIAGNOSIS — M1711 Unilateral primary osteoarthritis, right knee: Secondary | ICD-10-CM

## 2013-08-09 LAB — POCT GLYCOSYLATED HEMOGLOBIN (HGB A1C): Hemoglobin A1C: 5.7

## 2013-08-09 LAB — GLUCOSE, POCT (MANUAL RESULT ENTRY): POC Glucose: 78 mg/dl (ref 70–99)

## 2013-08-09 MED ORDER — NAPROXEN 500 MG PO TABS: 500.0000 mg | ORAL_TABLET | Freq: Two times a day (BID) | ORAL | Status: DC

## 2013-08-09 NOTE — Progress Notes (Signed)
Patient ID: Mason [name], male   DOB: 01/02/1966, 48 y.o.   MRN: [id number]   MHD:[id number]  [id number]:[id number]  DOB - 07/07/1965  CC:  Chief Complaint  Patient presents with  . Hospitalization Follow-up       HPI:  Mason [name] is a 48 y.o. male here today for a hospital follow up for tonsillar cancer.  The patient was recently diagnosed with with tonsillar cancer last month and is currently being followed by a oncologist at the time.  He states that is oncologist requested that he get a primary care provider to manage his non-cancer related problems.  The patient reports that he just had teeth removed and had a enlarged axillary lymph node that came back benign.  The patient reports mild SOB since surgery but no problems with swallowing.  He reports good pain control.  The patient is having some right knee pain and swelling. 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  Upon review of his prison charts and old medical records the patient had old x-rays that showed osteoarthritis.  He reports that he has not had knee pain in a couple of week, it usually happens when he has prolonged standing.   No Known Allergies Past Medical History  Diagnosis Date  . 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 Knee pain, chronic   . Anxiety     mild new dx  . Arthritis     knees,hips  . Concussion     Hx: in high school x 2   Current Outpatient Prescriptions on File Prior to Visit  Medication Sig Dispense Refill  . amLODipine (NORVASC) 2.5 MG tablet Take 1 tablet (2.5 mg total) by mouth daily.  30 tablet  0  . amoxicillin-clavulanate (AUGMENTIN) 875-125 MG per tablet Take 1 tablet by mouth 2 (two) times daily. He is to take for 10 days and had it filled on 08/02/13.      Marland Kitchen oxyCODONE-acetaminophen (PERCOCET) 5-325 MG per tablet Take one or two tablets by mouth every 4-6 hours as needed for pain.  40 tablet  0  . sodium fluoride  (FLUORISHIELD) 1.1 % GEL dental gel Instill one drop into each tooth space of fluoride tray. Place over teeth for 5 minutes. Remove. Spit out excess. Repeat nightly.  120 mL  prn   No current facility-administered medications on file prior to visit.   Family History  Problem Relation Age of Onset  . CVA    . Diabetes    . Heart attack    . Arthritis Father   . Arthritis Mother    History   Social History  . Marital Status: Single    Spouse Name: N/A    Number of Children: 1  . Years of Education: N/A   Occupational History  . Not on file.   Social History Main Topics  . Smoking status: Former Smoker -- 1.00 packs/day for 20 years    Types: Cigarettes    Quit date: 07/16/2003  . Smokeless tobacco: Never Used  . Alcohol Use: 7.2 oz/week    12 Cans of beer per week     Comment: social [place], beer  "2 weekends out of the month"  . Drug Use: Yes    Special: Marijuana  . Sexual Activity: Not on file   Other Topics Concern  . Not on file   Social History Narrative  . No narrative  on file    Review of Systems: Constitutional: Negative for fever, chills, diaphoresis, activity change, appetite change and fatigue. HENT: Negative for ear pain, nosebleeds, congestion, facial swelling, rhinorrhea, neck pain, neck stiffness and ear discharge.  Eyes: Negative for pain, discharge, redness, itching and visual disturbance. Respiratory: Negative for cough, choking, chest tightness, wheezing and stridor. +SOB since surgery on tonsils and teeth extraction Cardiovascular: Negative for chest pain, palpitations and leg swelling. Gastrointestinal: Negative for abdominal distention. Genitourinary: Negative for dysuria, urgency, frequency, hematuria, flank pain, decreased urine volume, difficulty urinating and dyspareunia.  Musculoskeletal: Negative for back pain, arthralgia and gait problem. + swelling of knee Neurological: Negative for dizziness, tremors, seizures, syncope, facial  asymmetry, speech difficulty, weakness, light-headedness, numbness and headaches.  Hematological: Negative for adenopathy. Does not bruise/bleed easily. Psychiatric/Behavioral: Negative for hallucinations, behavioral problems, confusion, dysphoric mood, decreased concentration and agitation.    Objective:   Filed Vitals:   08/09/13 1411  BP: 141/94  Pulse: 78  Temp: 98.9 F (37.2 C)  Resp: 16    Physical Exam: Constitutional: Patient appears well-developed and well-nourished. No distress. HENT: Normocephalic, atraumatic, External right and left ear normal. Oropharynx is clear and moist. Packing/sutures in molars from tooth extraction Eyes: Conjunctivae and EOM are normal. PERRLA, no scleral icterus. Neck: Normal ROM. Neck supple. No JVD. No tracheal deviation. No thyromegaly. CVS: RRR, S1/S2 +, no murmurs, no gallops, no carotid bruit.  Pulmonary: Effort and breath sounds normal, no stridor, rhonchi, wheezes, rales.  Abdominal: Soft. BS +, no distension, tenderness, rebound or guarding.  Musculoskeletal: Normal range of motion. No edema and no tenderness. Lymphadenopathy: No lymphadenopathy noted, cervical, inguinal. Positive left axillary lymphadenopathy Neuro: Alert. Normal reflexes, muscle tone coordination. No cranial nerve deficit. Skin: Skin is warm and dry. No rash noted. Not diaphoretic. No erythema. No pallor. Psychiatric: Normal mood and affect. Behavior, judgment, thought content normal.  Lab Results  Component Value Date   WBC 8.7 08/06/2013   HGB 13.9 08/06/2013   HCT 40.8 08/06/2013   MCV 87.4 08/06/2013   PLT 212 08/06/2013   Lab Results  Component Value Date   CREATININE 1.12 08/02/2013   BUN 12 08/02/2013   NA 137 08/02/2013   K 3.8 08/02/2013   CL 99 08/02/2013   CO2 24 08/02/2013    No results found for this basename: HGBA1C   Lipid Panel  No results found for this basename: chol, trig, hdl, cholhdl, vldl, ldlcalc       Assessment and plan:   Mason [name]  was seen today for hospitalization follow-up.  Diagnoses and associated orders for this visit:  Encounter to establish care - Glucose (CBG) - HgB A1c - Lipid Panel; Future  Osteoarthritis of right knee - naproxen (NAPROSYN) 500 MG tablet; Take 1 tablet (500 mg total) by mouth 2 (two) times daily with a meal.  Tonsillar cancer Continue to follow up with oncologist  Return in about 1 year (around 08/10/2014), or if symptoms worsen or fail to improve.  The patient was given clear instructions to go to ER or return to medical center if symptoms don't improve, worsen or new problems develop.   Chari Manning, NP-C [facility] [phone] 08/12/2013, 11:24 AM

## 2013-08-09 NOTE — Patient Instructions (Signed)
Osteoarthritis Osteoarthritis is a disease that causes soreness and swelling (inflammation) of a joint. It occurs when the cartilage at the affected joint wears down. Cartilage acts as a cushion, covering the ends of bones where they meet to form a joint. Osteoarthritis is the most common form of arthritis. It often occurs in older people. The joints affected most often by this condition include those in the:  Ends of the fingers.  Thumbs.  Neck.  Lower back.  Knees.  Hips. CAUSES  Over time, the cartilage that covers the ends of bones begins to wear away. This causes bone to rub on bone, producing pain and stiffness in the affected joints.  RISK FACTORS Certain factors can increase your chances of having osteoarthritis, including:  Older age.  Excessive body weight.  Overuse of joints. SIGNS AND SYMPTOMS   Pain, swelling, and stiffness in the joint.  Over time, the joint may lose its normal shape.  Small deposits of bone (osteophytes) may grow on the edges of the joint.  Bits of bone or cartilage can break off and float inside the joint space. This may cause more pain and damage. DIAGNOSIS  Your health care provider will do a physical exam and ask about your symptoms. Various tests may be ordered, such as:  X-rays of the affected joint.  An MRI scan.  Blood tests to rule out other types of arthritis.  Joint fluid tests. This involves using a needle to draw fluid from the joint and examining the fluid under a microscope. TREATMENT  Goals of treatment are to control pain and improve joint function. Treatment plans may include:  A prescribed exercise program that allows for rest and joint relief.  A weight control plan.  Pain relief techniques, such as:  Properly applied heat and cold.  Electric pulses delivered to nerve endings under the skin (transcutaneous electrical nerve stimulation, TENS).  Massage.  Certain nutritional supplements.  Medicines to  control pain, such as:  Acetaminophen.  Nonsteroidal anti-inflammatory drugs (NSAIDs), such as naproxen.  Narcotic or central-acting agents, such as tramadol.  Corticosteroids. These can be given orally or as an injection.  Surgery to reposition the bones and relieve pain (osteotomy) or to remove loose pieces of bone and cartilage. Joint replacement may be needed in advanced states of osteoarthritis. HOME CARE INSTRUCTIONS   Only take over-the-counter or prescription medicines as directed by your health care provider. Take all medicines exactly as instructed.  Maintain a healthy weight. Follow your health care provider's instructions for weight control. This may include dietary instructions.  Exercise as directed. Your health care provider can recommend specific types of exercise. These may include:  Strengthening exercises These are done to strengthen the muscles that support joints affected by arthritis. They can be performed with weights or with exercise bands to add resistance.  Aerobic activities These are exercises, such as brisk walking or low-impact aerobics, that get your heart pumping.  Range-of-motion activities These keep your joints limber.  Balance and agility exercises These help you maintain daily living skills.  Rest your affected joints as directed by your health care provider.  Follow up with your health care provider as directed. SEEK MEDICAL CARE IF:   Your skin turns red.  You develop a rash in addition to your joint pain.  You have worsening joint pain. SEEK IMMEDIATE MEDICAL CARE IF:  You have a significant loss of weight or appetite.  You have a fever along with joint or muscle aches.  You have   night sweats. FOR MORE INFORMATION  National Institute of Arthritis and Musculoskeletal and Skin Diseases: www.niams.nih.gov National Institute on Aging: www.nia.nih.gov American College of Rheumatology: www.rheumatology.org Document Released: 04/04/2005  Document Revised: 01/23/2013 Document Reviewed: 12/10/2012 [employer] Patient Information 2014 [employer].  

## 2013-08-09 NOTE — Progress Notes (Signed)
HFU Pt has been recently diagnosed with throat cancer. Pt states that he has fluctuating BP.

## 2013-08-12 ENCOUNTER — Ambulatory Visit (INDEPENDENT_AMBULATORY_CARE_PROVIDER_SITE_OTHER): Payer: Medicaid Other | Admitting: General Surgery

## 2013-08-12 ENCOUNTER — Encounter (INDEPENDENT_AMBULATORY_CARE_PROVIDER_SITE_OTHER): Payer: Self-pay | Admitting: General Surgery

## 2013-08-12 ENCOUNTER — Ambulatory Visit (HOSPITAL_COMMUNITY): Payer: Medicaid - Dental | Admitting: Dentistry

## 2013-08-12 ENCOUNTER — Encounter (INDEPENDENT_AMBULATORY_CARE_PROVIDER_SITE_OTHER): Payer: Self-pay

## 2013-08-12 ENCOUNTER — Encounter (HOSPITAL_COMMUNITY): Payer: Self-pay | Admitting: Dentistry

## 2013-08-12 VITALS — BP 120/84 | HR 82 | Temp 98.1°F

## 2013-08-12 VITALS — BP 158/100 | HR 88 | Resp 16 | Ht 79.0 in | Wt 328.6 lb

## 2013-08-12 DIAGNOSIS — Z0189 Encounter for other specified special examinations: Secondary | ICD-10-CM

## 2013-08-12 DIAGNOSIS — T148XXD Other injury of unspecified body region, subsequent encounter: Secondary | ICD-10-CM

## 2013-08-12 DIAGNOSIS — C099 Malignant neoplasm of tonsil, unspecified: Secondary | ICD-10-CM

## 2013-08-12 DIAGNOSIS — K08199 Complete loss of teeth due to other specified cause, unspecified class: Secondary | ICD-10-CM

## 2013-08-12 NOTE — Progress Notes (Signed)
Patient ID: Mason [name], male   DOB: [birth year], 48 y.o.   MRN: [id number]  Chief Complaint  Patient presents with  . tonsillar cancer    HPI Mason [name] is a 48 y.o. male.  The patient is a 48 year old who is referred by Dr. Alvy Bimler for a Port-A-Cath placement and laparoscopic G-tube placement. Patient has had a history of right throat pain. He's had a biopsy which revealed tonsillar cancer. HPI  Past Medical History  Diagnosis Date  . 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 Knee pain, chronic   . Anxiety     mild new dx  . Arthritis     knees,hips  . Concussion     Hx: in high school x 2    Past Surgical History  Procedure Laterality Date  . Multiple extractions with alveoloplasty N/A 08/01/2013    Procedure: Extraction of tooth #'s 1,15,17,31, 32 with alveoloplasty, mandibular left torus reduction, and gross debridement of remaining teeth.;  Surgeon: [name], DDS;  Location: [facility];  Service: Oral Surgery;  Laterality: N/A;    Family History  Problem Relation Age of Onset  . CVA    . Diabetes    . Heart attack    . Arthritis Father   . Arthritis Mother     Social History History  Substance Use Topics  . Smoking status: Former Smoker -- 1.00 packs/day for 20 years    Types: Cigarettes    Quit date: 07/16/2003  . Smokeless tobacco: Never Used  . Alcohol Use: 7.2 oz/week    12 Cans of beer per week     Comment: social [place], beer  "2 weekends out of the month"    No Known Allergies  Current Outpatient Prescriptions  Medication Sig Dispense Refill  . amLODipine (NORVASC) 2.5 MG tablet Take 1 tablet (2.5 mg total) by mouth daily.  30 tablet  0  . amoxicillin-clavulanate (AUGMENTIN) 875-125 MG per tablet Take 1 tablet by mouth 2 (two) times daily. He is to take for 10 days and had it filled on 08/02/13.      . naproxen (NAPROSYN) 500 MG tablet Take 1 tablet (500 mg total) by mouth 2 (two) times daily with a meal.  30 tablet  2  .  oxyCODONE-acetaminophen (PERCOCET) 5-325 MG per tablet Take one or two tablets by mouth every 4-6 hours as needed for pain.  40 tablet  0  . sodium fluoride (FLUORISHIELD) 1.1 % GEL dental gel Instill one drop into each tooth space of fluoride tray. Place over teeth for 5 minutes. Remove. Spit out excess. Repeat nightly.  120 mL  prn   No current facility-administered medications for this visit.    Review of Systems Review of Systems  Constitutional: Negative.   HENT: Negative.   Eyes: Negative.   Respiratory: Negative.   Cardiovascular: Negative.   Gastrointestinal: Negative.   Endocrine: Negative.   Neurological: Negative.     Blood pressure 158/100, pulse 88, resp. rate 16, height 6\' 7"  (2.007 m), weight 328 lb 9.6 oz (149.052 kg).  Physical Exam Physical Exam  Constitutional: He is oriented to person, place, and time. He appears well-developed and well-nourished.  HENT:  Head: Normocephalic and atraumatic.  Eyes: Conjunctivae and EOM are normal. Pupils are equal, round, and reactive to light.  Neck: Normal range of motion. Neck supple.  Cardiovascular: Normal rate, regular rhythm and normal heart sounds.   Pulmonary/Chest: Effort normal and breath sounds normal.  Musculoskeletal: Normal range of motion.  Neurological: He is alert and oriented to person, place, and time.  Skin: Skin is warm and dry.    Data Reviewed As above  Assessment    48 year old male with tonsillar cancer     Plan    1. We'll proceed to the operating room for left-sided Port-A-Cath placement as well as a laparoscopic G-tube placement. 2. I discussed with the patient the risks and benefits of the procedure to include but not limited to infection, bleeding, possible pneumothorax, possible leakage of the G-tube and/or need for replacement. The patient voiced understanding and wishes to proceed.        [name] 08/12/2013, 5:17 PM

## 2013-08-12 NOTE — Patient Instructions (Signed)
Rinse with salt water every 2 hours while awake as instructed. Patient was provided a Monoject syringe to assist in irrigation of the upper right and upper left surgical sites. Brush teeth after meals and at bedtime. Use fluoride at bedtime. Return to clinic as scheduled prior to start of chemoradiation therapy. Call if problems arise before then.  Dr. Enrique Sack

## 2013-08-12 NOTE — Progress Notes (Signed)
POST OPERATIVE NOTE:  08/12/2013 JOVONTE COMMINS [id number]  VITALS: BP 120/84  Pulse 82  Temp(Src) 98.1 F (36.7 C) (Oral)  LABS:  Lab Results  Component Value Date   WBC 8.7 08/06/2013   HGB 13.9 08/06/2013   HCT 40.8 08/06/2013   MCV 87.4 08/06/2013   PLT 212 08/06/2013   BMET    Component Value Date/Time   NA 137 08/02/2013 0340   K 3.8 08/02/2013 0340   CL 99 08/02/2013 0340   CO2 24 08/02/2013 0340   GLUCOSE 101* 08/02/2013 0340   BUN 12 08/02/2013 0340   BUN 14.6 07/24/2013 1022   CREATININE 1.12 08/02/2013 0340   CREATININE 1.4* 07/24/2013 1022   CALCIUM 9.1 08/02/2013 0340   GFRNONAA 78* 08/02/2013 0340   GFRAA >90 08/02/2013 0340    Lab Results  Component Value Date   INR 0.94 08/06/2013   No results found for this basename: PTT     TAYLOR LEVICK is status post extraction of multiple teeth with alveoloplasty and pre-prosthetic surgery along with gross debridement of remaining dentition the operating room on 08/01/2013.  SUBJECTIVE: The patient has minimal complaints. The patient is using salt water rinses by report.  EXAM: There is no sign of infection, heme, or ooze. The mandibular quadrants are healing in by primary closure. The maxillary right and maxillary left surgical sites are healing in by secondary intention. There is some delayed healing involving the upper right and upper left quadrants. The patient, however, denies having any problems associated with the healing.  PROCEDURE: The patient was given a chlorhexidine gluconate rinse for 30 seconds. Sutures were then removed without complication. Patient tolerated the procedure well.  ASSESSMENT: Post operative course is consistent with dental procedures performed int he operating room. There is some delayed healing involving the upper right and upper left posterior quadrants.  PLAN: 1. Continue salt water rinses every 2 hours while awake to aid healing. Patient was given a Monoject syringe to assist in  irrigation of the upper right and upper left quadrants. 2. Brush after meals and at bedtime. Use fluoride at bedtime. Floss at bedtime. 3. Proceed with plan to start chemoradiation therapy on 08/19/2013. Although it would be ideal to allow for more time to heal from the dental extractions, it is also important to start chemoradiation therapy on a timely basis. Therefore, I will not recommend delayed chemoradiation at this time. I will see the patient on 08/19/2013 prior to start of chemoradiation therapy to ensure that patient continues to heal in an appropriate manner. The patient and Dr. Isidore Moos are aware of the delayed healing.   [name], DDS

## 2013-08-13 ENCOUNTER — Ambulatory Visit (HOSPITAL_COMMUNITY): Payer: Self-pay

## 2013-08-13 ENCOUNTER — Other Ambulatory Visit: Payer: Self-pay

## 2013-08-14 ENCOUNTER — Encounter (HOSPITAL_COMMUNITY): Payer: Self-pay | Admitting: *Deleted

## 2013-08-14 ENCOUNTER — Telehealth (INDEPENDENT_AMBULATORY_CARE_PROVIDER_SITE_OTHER): Payer: Self-pay

## 2013-08-14 ENCOUNTER — Other Ambulatory Visit (INDEPENDENT_AMBULATORY_CARE_PROVIDER_SITE_OTHER): Payer: Self-pay | Admitting: General Surgery

## 2013-08-14 ENCOUNTER — Encounter: Payer: Self-pay | Admitting: Radiation Oncology

## 2013-08-14 MED ORDER — DEXTROSE 5 % IV SOLN
3.0000 g | INTRAVENOUS | Status: AC
  Administered 2013-08-15: 3 g via INTRAVENOUS
  Filled 2013-08-14: qty 3000

## 2013-08-14 MED ORDER — CHLORHEXIDINE GLUCONATE 4 % EX LIQD
1.0000 "application " | Freq: Once | CUTANEOUS | Status: DC
  Filled 2013-08-14: qty 15

## 2013-08-14 NOTE — Progress Notes (Signed)
08/14/13 1641  OBSTRUCTIVE SLEEP APNEA  Have you ever been diagnosed with sleep apnea through a sleep study? No  Do you snore loudly (loud enough to be heard through closed doors)?  1  Do you often feel tired, fatigued, or sleepy during the daytime? 0  Has anyone observed you stop breathing during your sleep? 0  Do you have, or are you being treated for high blood pressure? 1  BMI more than 35 kg/m2? 1  Age over 46 years old? 0  Neck circumference greater than 40 cm/16 inches? 1  Gender: 1  Obstructive Sleep Apnea Score 5  Score 4 or greater  Results sent to PCP

## 2013-08-14 NOTE — Telephone Encounter (Signed)
Ester from [facility] called requesting Dr Rosendo Gros put surgical orders in epic for pts surgery tomorrow.

## 2013-08-14 NOTE — Progress Notes (Signed)
Pt denies SOB, chest pain, and being under the care of a cardiologist. Pt made aware to stop taking Aspirin and herbal medications. Do not take any NSAIDs ie: Ibuprofen, Advil, Naproxen or any medication containing Aspirin.

## 2013-08-15 ENCOUNTER — Ambulatory Visit (HOSPITAL_COMMUNITY): Payer: Medicaid Other

## 2013-08-15 ENCOUNTER — Encounter (HOSPITAL_COMMUNITY): Payer: Medicaid Other | Admitting: Anesthesiology

## 2013-08-15 ENCOUNTER — Encounter (HOSPITAL_COMMUNITY)
Admission: RE | Disposition: A | Discharge status: Home / Self Care | Payer: Self-pay | Source: Ambulatory Visit | Attending: General Surgery

## 2013-08-15 ENCOUNTER — Encounter (HOSPITAL_COMMUNITY): Payer: Self-pay | Admitting: *Deleted

## 2013-08-15 ENCOUNTER — Ambulatory Visit (HOSPITAL_COMMUNITY)
Admission: RE | Admit: 2013-08-15 | Discharge: 2013-08-15 | Disposition: A | Discharge status: Home / Self Care | Payer: Medicaid Other | Source: Ambulatory Visit | Attending: General Surgery | Admitting: General Surgery

## 2013-08-15 ENCOUNTER — Ambulatory Visit (HOSPITAL_COMMUNITY): Payer: Medicaid Other | Admitting: Anesthesiology

## 2013-08-15 DIAGNOSIS — Z79899 Other long term (current) drug therapy: Secondary | ICD-10-CM | POA: Diagnosis not present

## 2013-08-15 DIAGNOSIS — F411 Generalized anxiety disorder: Secondary | ICD-10-CM | POA: Insufficient documentation

## 2013-08-15 DIAGNOSIS — M1710 Unilateral primary osteoarthritis, unspecified knee: Secondary | ICD-10-CM | POA: Insufficient documentation

## 2013-08-15 DIAGNOSIS — M1610 Unilateral primary osteoarthritis, unspecified hip: Secondary | ICD-10-CM | POA: Diagnosis not present

## 2013-08-15 DIAGNOSIS — C099 Malignant neoplasm of tonsil, unspecified: Secondary | ICD-10-CM | POA: Diagnosis not present

## 2013-08-15 DIAGNOSIS — IMO0002 Reserved for concepts with insufficient information to code with codable children: Secondary | ICD-10-CM

## 2013-08-15 DIAGNOSIS — Z87891 Personal history of nicotine dependence: Secondary | ICD-10-CM | POA: Insufficient documentation

## 2013-08-15 DIAGNOSIS — Z510 Encounter for antineoplastic radiation therapy: Secondary | ICD-10-CM | POA: Diagnosis not present

## 2013-08-15 HISTORY — DX: Essential (primary) hypertension: I10

## 2013-08-15 HISTORY — PX: LAPAROSCOPIC GASTROSTOMY: SHX5896

## 2013-08-15 HISTORY — DX: Other complications of anesthesia, initial encounter: T88.59XA

## 2013-08-15 HISTORY — PX: PORTACATH PLACEMENT: SHX2246

## 2013-08-15 HISTORY — DX: Adverse effect of unspecified anesthetic, initial encounter: T41.45XA

## 2013-08-15 SURGERY — CREATION, GASTROSTOMY, LAPAROSCOPIC
Anesthesia: General | Site: Chest

## 2013-08-15 MED ORDER — SUCCINYLCHOLINE CHLORIDE 20 MG/ML IJ SOLN
INTRAMUSCULAR | Status: AC
  Filled 2013-08-15: qty 1

## 2013-08-15 MED ORDER — OXYMETAZOLINE HCL 0.05 % NA SOLN
NASAL | Status: AC
  Filled 2013-08-15: qty 15

## 2013-08-15 MED ORDER — OXYCODONE-ACETAMINOPHEN 10-325 MG PO TABS: 1.0000 | ORAL_TABLET | ORAL | Status: DC | PRN

## 2013-08-15 MED ORDER — PROPOFOL 10 MG/ML IV BOLUS
INTRAVENOUS | Status: AC
  Filled 2013-08-15: qty 20

## 2013-08-15 MED ORDER — FENTANYL CITRATE 0.05 MG/ML IJ SOLN
INTRAMUSCULAR | Status: DC | PRN
  Administered 2013-08-15: 100 ug via INTRAVENOUS
  Administered 2013-08-15: 50 ug via INTRAVENOUS

## 2013-08-15 MED ORDER — ARTIFICIAL TEARS OP OINT
TOPICAL_OINTMENT | OPHTHALMIC | Status: AC
  Filled 2013-08-15: qty 3.5

## 2013-08-15 MED ORDER — PROPOFOL 10 MG/ML IV BOLUS
INTRAVENOUS | Status: DC | PRN
  Administered 2013-08-15: 250 mg via INTRAVENOUS
  Administered 2013-08-15: 100 mg via INTRAVENOUS

## 2013-08-15 MED ORDER — SODIUM CHLORIDE 0.9 % IR SOLN
Status: DC | PRN
  Administered 2013-08-15: 11:00:00

## 2013-08-15 MED ORDER — ARTIFICIAL TEARS OP OINT
TOPICAL_OINTMENT | OPHTHALMIC | Status: DC | PRN
  Administered 2013-08-15: 1 via OPHTHALMIC

## 2013-08-15 MED ORDER — LIDOCAINE HCL (CARDIAC) 20 MG/ML IV SOLN
INTRAVENOUS | Status: AC
  Filled 2013-08-15: qty 5

## 2013-08-15 MED ORDER — OXYCODONE HCL 5 MG PO TABS
5.0000 mg | ORAL_TABLET | Freq: Once | ORAL | Status: AC | PRN
  Administered 2013-08-15: 5 mg via ORAL

## 2013-08-15 MED ORDER — GLYCOPYRROLATE 0.2 MG/ML IJ SOLN
INTRAMUSCULAR | Status: AC
  Filled 2013-08-15: qty 3

## 2013-08-15 MED ORDER — METOCLOPRAMIDE HCL 5 MG/ML IJ SOLN
INTRAMUSCULAR | Status: AC
  Filled 2013-08-15: qty 2

## 2013-08-15 MED ORDER — FENTANYL CITRATE 0.05 MG/ML IJ SOLN
INTRAMUSCULAR | Status: AC
  Filled 2013-08-15: qty 5

## 2013-08-15 MED ORDER — METOCLOPRAMIDE HCL 5 MG/ML IJ SOLN
INTRAMUSCULAR | Status: DC | PRN
Start: 2013-08-15 — End: 2013-08-15
  Administered 2013-08-15: 10 mg via INTRAVENOUS

## 2013-08-15 MED ORDER — LACTATED RINGERS IV SOLN
INTRAVENOUS | Status: DC | PRN
  Administered 2013-08-15 (×2): via INTRAVENOUS

## 2013-08-15 MED ORDER — OXYMETAZOLINE HCL 0.05 % NA SOLN
NASAL | Status: DC | PRN
  Administered 2013-08-15: 2 via NASAL

## 2013-08-15 MED ORDER — DEXAMETHASONE SODIUM PHOSPHATE 4 MG/ML IJ SOLN
INTRAMUSCULAR | Status: AC
  Filled 2013-08-15: qty 2

## 2013-08-15 MED ORDER — ROCURONIUM BROMIDE 50 MG/5ML IV SOLN
INTRAVENOUS | Status: AC
  Filled 2013-08-15: qty 1

## 2013-08-15 MED ORDER — METOCLOPRAMIDE HCL 5 MG/ML IJ SOLN: 10.0000 mg | Freq: Once | INTRAMUSCULAR | Status: DC | PRN

## 2013-08-15 MED ORDER — HYDROMORPHONE HCL PF 1 MG/ML IJ SOLN
0.2500 mg | INTRAMUSCULAR | Status: DC | PRN
  Administered 2013-08-15 (×2): 0.5 mg via INTRAVENOUS

## 2013-08-15 MED ORDER — BUPIVACAINE HCL (PF) 0.25 % IJ SOLN
INTRAMUSCULAR | Status: AC
  Filled 2013-08-15: qty 30

## 2013-08-15 MED ORDER — DEXAMETHASONE SODIUM PHOSPHATE 10 MG/ML IJ SOLN
INTRAMUSCULAR | Status: DC | PRN
  Administered 2013-08-15: 8 mg via INTRAVENOUS

## 2013-08-15 MED ORDER — OXYCODONE HCL 5 MG/5ML PO SOLN: 5.0000 mg | Freq: Once | ORAL | Status: AC | PRN

## 2013-08-15 MED ORDER — HYDROMORPHONE HCL PF 1 MG/ML IJ SOLN
INTRAMUSCULAR | Status: AC
  Filled 2013-08-15: qty 1

## 2013-08-15 MED ORDER — SUCCINYLCHOLINE CHLORIDE 20 MG/ML IJ SOLN
INTRAMUSCULAR | Status: DC | PRN
  Administered 2013-08-15: 160 mg via INTRAVENOUS

## 2013-08-15 MED ORDER — MIDAZOLAM HCL 2 MG/2ML IJ SOLN
INTRAMUSCULAR | Status: AC
  Filled 2013-08-15: qty 2

## 2013-08-15 MED ORDER — MIDAZOLAM HCL 5 MG/5ML IJ SOLN
INTRAMUSCULAR | Status: DC | PRN
  Administered 2013-08-15: 2 mg via INTRAVENOUS

## 2013-08-15 MED ORDER — 0.9 % SODIUM CHLORIDE (POUR BTL) OPTIME
TOPICAL | Status: DC | PRN
  Administered 2013-08-15: 1000 mL

## 2013-08-15 MED ORDER — PHENYLEPHRINE HCL 10 MG/ML IJ SOLN
INTRAMUSCULAR | Status: DC | PRN
  Administered 2013-08-15 (×2): 120 ug via INTRAVENOUS

## 2013-08-15 MED ORDER — NEOSTIGMINE METHYLSULFATE 10 MG/10ML IV SOLN
INTRAVENOUS | Status: AC
  Filled 2013-08-15: qty 1

## 2013-08-15 MED ORDER — ONDANSETRON HCL 4 MG/2ML IJ SOLN
INTRAMUSCULAR | Status: DC | PRN
  Administered 2013-08-15: 4 mg via INTRAVENOUS

## 2013-08-15 MED ORDER — BUPIVACAINE HCL 0.25 % IJ SOLN
INTRAMUSCULAR | Status: DC | PRN
  Administered 2013-08-15: 1 mL

## 2013-08-15 MED ORDER — ONDANSETRON HCL 4 MG/2ML IJ SOLN
INTRAMUSCULAR | Status: AC
  Filled 2013-08-15: qty 2

## 2013-08-15 MED ORDER — OXYCODONE HCL 5 MG PO TABS
ORAL_TABLET | ORAL | Status: AC
  Filled 2013-08-15: qty 1

## 2013-08-15 MED ORDER — LIDOCAINE HCL (CARDIAC) 20 MG/ML IV SOLN
INTRAVENOUS | Status: DC | PRN
  Administered 2013-08-15: 100 mg via INTRAVENOUS

## 2013-08-15 MED ORDER — LACTATED RINGERS IV SOLN
INTRAVENOUS | Status: DC
  Administered 2013-08-15: 50 mL/h via INTRAVENOUS

## 2013-08-15 SURGICAL SUPPLY — 73 items
BAG DECANTER FOR FLEXI CONT (MISCELLANEOUS) ×5 IMPLANT
BINDER ABDOMINAL 12 ML 46-62 (SOFTGOODS) ×5 IMPLANT
BIOPATCH WHT 1IN DISK W/4.0 H (GAUZE/BANDAGES/DRESSINGS) ×5 IMPLANT
BLADE SURG ROTATE 9660 (MISCELLANEOUS) ×5 IMPLANT
CANISTER SUCTION 2500CC (MISCELLANEOUS) ×5 IMPLANT
CHLORAPREP W/TINT 10.5 ML (MISCELLANEOUS) ×5 IMPLANT
CHLORAPREP W/TINT 26ML (MISCELLANEOUS) ×5 IMPLANT
CLOSURE STERI-STRIP 1/2X4 (GAUZE/BANDAGES/DRESSINGS) ×1
CLSR STERI-STRIP ANTIMIC 1/2X4 (GAUZE/BANDAGES/DRESSINGS) ×4 IMPLANT
COVER MAYO STAND STRL (DRAPES) ×5 IMPLANT
COVER SURGICAL LIGHT HANDLE (MISCELLANEOUS) ×5 IMPLANT
CRADLE DONUT ADULT HEAD (MISCELLANEOUS) ×5 IMPLANT
DECANTER SPIKE VIAL GLASS SM (MISCELLANEOUS) ×5 IMPLANT
DERMABOND ADVANCED (GAUZE/BANDAGES/DRESSINGS) ×2
DERMABOND ADVANCED .7 DNX12 (GAUZE/BANDAGES/DRESSINGS) ×3 IMPLANT
DEVICE TROCAR PUNCTURE CLOSURE (ENDOMECHANICALS) ×5 IMPLANT
DRAPE C-ARM 42X72 X-RAY (DRAPES) ×5 IMPLANT
DRAPE CHEST BREAST 15X10 FENES (DRAPES) ×10 IMPLANT
DRAPE ORTHO SPLIT 77X108 STRL (DRAPES) ×4
DRAPE SURG ORHT 6 SPLT 77X108 (DRAPES) ×6 IMPLANT
DRAPE UTILITY 15X26 W/TAPE STR (DRAPE) ×10 IMPLANT
DRSG COVADERM 4X6 (GAUZE/BANDAGES/DRESSINGS) ×5 IMPLANT
DRSG TEGADERM 4X4.75 (GAUZE/BANDAGES/DRESSINGS) ×10 IMPLANT
ELECT CAUTERY BLADE 6.4 (BLADE) ×5 IMPLANT
ELECT REM PT RETURN 9FT ADLT (ELECTROSURGICAL) ×5
ELECTRODE REM PT RTRN 9FT ADLT (ELECTROSURGICAL) ×3 IMPLANT
GAUZE SPONGE 4X4 16PLY XRAY LF (GAUZE/BANDAGES/DRESSINGS) ×5 IMPLANT
GLOVE BIO SURGEON STRL SZ7.5 (GLOVE) ×25 IMPLANT
GLOVE BIOGEL PI IND STRL 6.5 (GLOVE) ×9 IMPLANT
GLOVE BIOGEL PI IND STRL 7.0 (GLOVE) ×9 IMPLANT
GLOVE BIOGEL PI IND STRL 8 (GLOVE) ×6 IMPLANT
GLOVE BIOGEL PI INDICATOR 6.5 (GLOVE) ×6
GLOVE BIOGEL PI INDICATOR 7.0 (GLOVE) ×6
GLOVE BIOGEL PI INDICATOR 8 (GLOVE) ×4
GLOVE ECLIPSE 6.5 STRL STRAW (GLOVE) ×5 IMPLANT
GLOVE SS BIOGEL STRL SZ 6.5 (GLOVE) ×3 IMPLANT
GLOVE SUPERSENSE BIOGEL SZ 6.5 (GLOVE) ×2
GLOVE SURG SS PI 6.0 STRL IVOR (GLOVE) ×5 IMPLANT
GLOVE SURG SS PI 7.0 STRL IVOR (GLOVE) ×10 IMPLANT
GOWN STRL REUS W/ TWL LRG LVL3 (GOWN DISPOSABLE) ×6 IMPLANT
GOWN STRL REUS W/ TWL XL LVL3 (GOWN DISPOSABLE) ×3 IMPLANT
GOWN STRL REUS W/TWL LRG LVL3 (GOWN DISPOSABLE) ×4
GOWN STRL REUS W/TWL XL LVL3 (GOWN DISPOSABLE) ×2
KIT BASIN OR (CUSTOM PROCEDURE TRAY) ×5 IMPLANT
KIT PORT POWER 8FR ISP CVUE (Catheter) ×5 IMPLANT
KIT ROOM TURNOVER OR (KITS) ×5 IMPLANT
NEEDLE HYPO 25GX1X1/2 BEV (NEEDLE) ×5 IMPLANT
NS IRRIG 1000ML POUR BTL (IV SOLUTION) ×5 IMPLANT
PACK GENERAL/GYN (CUSTOM PROCEDURE TRAY) ×5 IMPLANT
PACK LAPAROSCOPY I 1258 (SET/KITS/TRAYS/PACK) ×5 IMPLANT
PACK SURGICAL SETUP 50X90 (CUSTOM PROCEDURE TRAY) ×5 IMPLANT
PAD ARMBOARD 7.5X6 YLW CONV (MISCELLANEOUS) ×10 IMPLANT
PENCIL BUTTON HOLSTER BLD 10FT (ELECTRODE) ×5 IMPLANT
SPONGE GAUZE 4X4 12PLY (GAUZE/BANDAGES/DRESSINGS) ×5 IMPLANT
STAPLER VISISTAT 35W (STAPLE) ×5 IMPLANT
SUT MNCRL AB 3-0 PS2 18 (SUTURE) ×5 IMPLANT
SUT PROLENE 2 0 SH 30 (SUTURE) ×35 IMPLANT
SUT SILK 0 FSL (SUTURE) ×10 IMPLANT
SUT SILK 2 0 TIES 10X30 (SUTURE) ×5 IMPLANT
SUT SILK 3 0 SH 30 (SUTURE) ×5 IMPLANT
SUT VIC AB 3-0 SH 27 (SUTURE) ×2
SUT VIC AB 3-0 SH 27X BRD (SUTURE) ×3 IMPLANT
SYR 20ML ECCENTRIC (SYRINGE) ×10 IMPLANT
SYR 30ML SLIP (SYRINGE) ×5 IMPLANT
SYR BULB 3OZ (MISCELLANEOUS) ×5 IMPLANT
SYR CONTROL 10ML LL (SYRINGE) ×5 IMPLANT
TAPE CLOTH SURG 4X10 WHT LF (GAUZE/BANDAGES/DRESSINGS) ×5 IMPLANT
TOWEL OR 17X24 6PK STRL BLUE (TOWEL DISPOSABLE) ×5 IMPLANT
TOWEL OR 17X26 10 PK STRL BLUE (TOWEL DISPOSABLE) ×5 IMPLANT
TROCAR XCEL NON-BLD 11X100MML (ENDOMECHANICALS) ×5 IMPLANT
TUBE CONNECTING 12'X1/4 (SUCTIONS) ×1
TUBE CONNECTING 12X1/4 (SUCTIONS) ×4 IMPLANT
TUBE GASTROSTOMY 18F (CATHETERS) ×5 IMPLANT

## 2013-08-15 NOTE — Op Note (Signed)
08/15/2013  12:54 PM  PATIENT:  Mason [name]  48 y.o. male  PRE-OPERATIVE DIAGNOSIS:  Tonsillar cancer  POST-OPERATIVE DIAGNOSIS:  Tonsillar cancer  PROCEDURE:  Procedure(s): LAPAROSCOPIC GASTROSTOMY TUBE PLACEMENT (N/A) INSERTION PORT-A-CATH (Left)  SURGEON:  Surgeon(s) and Role:    * [name], MD - Primary  PHYSICIAN ASSISTANT:   ASSISTANTS: none   ANESTHESIA:   local and general  EBL:  Total I/O In: 1000 [I.V.:1000] Out: -   BLOOD ADMINISTERED:none  DRAINS: Gastrostomy Tube 18Fr Mic tube  LOCAL MEDICATIONS USED:  BUPIVICAINE   SPECIMEN:  No Specimen  DISPOSITION OF SPECIMEN:  N/A  COUNTS:  YES  TOURNIQUET:  * No tourniquets in log *  DICTATION: .Dragon Dictation Details of the procedure:  The patient was taken back to the operating room. The patient was placed in supine position with bilateral SCDs in place. After appropriate anitbiotics were confirmed, a time-out was confirmed and all facts were verified. A 3 cm incision was made in the left deltopectoral groove. Bovie cautery was used to maintain hemostasis and dissection was taken down just superficial to the pectoralis fashion. Blunt dissection was used to create a pocket. The port was placed in this area check for a good fit. At this time a Seldinger technique was used to cannulize the subclavian vein. This was confirmed with fluoroscopy. At this time the introducer was then placed over the wire, the wire removed. In the catheter was then placed into the introducer. Under direct fluoroscopy the catheter was repositioned to be at [facility] /atrial junction. This was approximately 25 cm. At this time the port was connected to the catheter and secured. The port was sutured into the previously made pocket with 2-0 Prolene x3. 2-0 silk was then used to anchor the junction of the catheter and port. A final x-ray was then shot to confirm the placement of the tip of the catheter as well as the curve the catheter to  the port. At this time the [facility] needle was used to aspirate from the port and flushed easily.At this time the skin was reapproximated using a 3-0 Monocryl subcuticular fashion. The skin was Dermabond and coverderm dressing was placed prior to the G-tube placment    We then proceeded to the laparoscopic Mic G-tube placement portion of the procedure. A verress needle was used to insufflate the abdomen to 16mm of mercury at the right subcostal margin. This was followed by a 69mm trocar and camera. There was no injury to any intraabdominal organ. The stomach was easily visualized. A 82mm trocar was than placed at the umbilicus. A second 25mm trocar was than placed in the left lower quadrant under direct visualization. A 2-0 silk was used to place a pursestring stitch at the level of the stomach that was going to come to the anterior abdominal wall. At this time a 2-0 prolene was than placed at the 3:00 and 12:00 position to help anchor the stomach to the anterior abdominal wall. A gastrotomy site was than made with the hook cautery. An incision was made in the skin and a tonsil hemostat was used to introduce the g-tube into the stomach. The balloon was filled with 20cc sterile saline. All 2-0 prolene was than brought up through the abdominal wall via an Endoclose device and tied down. A third and fourth prolene was than placed at the 9:00 and 6:00 position. These were brought up through the anterior abdominal wall via an Endoclose device and tied down. The balloon was  brought up to the make the stomach snug to the anterior abdominal wall. This was all done under direct visualization. The bolster was set tight at 7cm. It was secured in place using a 2-0 nylon x 2. The 1m trocar was closed using a 0 vicryl via a suture passer x 1 The insuflation was evacuated and all trocars were removed. The trocar sites were than closed with 4-0 monocryl. The skin was dressed with steristrips and guaze and tape.   The patient was  awakened from anesthesia and taken to recovery in stable condition.    PLAN OF CARE: Discharge to home after PACU  PATIENT DISPOSITION:  PACU - hemodynamically stable.   Delay start of Pharmacological VTE agent (>24hrs) due to surgical blood loss or risk of bleeding: not applicable

## 2013-08-15 NOTE — Anesthesia Preprocedure Evaluation (Signed)
Anesthesia Evaluation  Patient identified by MRN, date of birth, ID band Patient awake    Reviewed: Allergy & Precautions, H&P , NPO status , Patient's Chart, lab work & pertinent test results, reviewed documented beta blocker date and time   History of Anesthesia Complications (+) history of anesthetic complications  Airway Mallampati: II TM Distance: >3 FB Neck ROM: full    Dental   Pulmonary former smoker,  breath sounds clear to auscultation        Cardiovascular hypertension, On Medications Rhythm:regular     Neuro/Psych negative neurological ROS  negative psych ROS   GI/Hepatic negative GI ROS, Neg liver ROS,   Endo/Other  negative endocrine ROS  Renal/GU negative Renal ROS  negative genitourinary   Musculoskeletal   Abdominal   Peds  Hematology  (+) Blood dyscrasia, ,   Anesthesia Other Findings See surgeon's H&P   Reproductive/Obstetrics negative OB ROS                           Anesthesia Physical Anesthesia Plan  ASA: III  Anesthesia Plan: General   Post-op Pain Management:    Induction: Intravenous  Airway Management Planned: Oral ETT  Additional Equipment:   Intra-op Plan:   Post-operative Plan: Extubation in OR  Informed Consent: I have reviewed the patients History and Physical, chart, labs and discussed the procedure including the risks, benefits and alternatives for the proposed anesthesia with the patient or authorized representative who has indicated his/her understanding and acceptance.   Dental Advisory Given  Plan Discussed with: CRNA and Surgeon  Anesthesia Plan Comments:         Anesthesia Quick Evaluation

## 2013-08-15 NOTE — Anesthesia Postprocedure Evaluation (Signed)
  Anesthesia Post-op Note  Patient: Mason [name]  Procedure(s) Performed: Procedure(s): LAPAROSCOPIC GASTROSTOMY TUBE PLACEMENT (N/A) INSERTION PORT-A-CATH (Left)  Patient Location: PACU  Anesthesia Type:General  Level of Consciousness: awake and alert   Airway and Oxygen Therapy: Patient Spontanous Breathing  Post-op Pain: moderate  Post-op Assessment: Post-op Vital signs reviewed, Patient's Cardiovascular Status Stable and Respiratory Function Stable  Post-op Vital Signs: Reviewed  Filed Vitals:   08/15/13 1415  BP: 147/72  Pulse: 81  Temp:   Resp: 19    Complications: No apparent anesthesia complications

## 2013-08-15 NOTE — Discharge Instructions (Signed)
Implanted Port Insertion, Care After Refer to this sheet in the next few weeks. These instructions provide you with information on caring for yourself after your procedure. Your health care provider may also give you more specific instructions. Your treatment has been planned according to current medical practices, but problems sometimes occur. Call your health care provider if you have any problems or questions after your procedure. WHAT TO EXPECT AFTER THE PROCEDURE After your procedure, it is typical to have the following:   Discomfort at the port insertion site. Ice packs to the area will help.  Bruising on the skin over the port. This will subside in 3 4 days. HOME CARE INSTRUCTIONS  After your port is placed, you will get a manufacturer's information card. The card has information about your port. Keep this card with you at all times.   Know what kind of port you have. There are many types of ports available.   Wear a medical alert bracelet in case of an emergency. This can help alert health care workers that you have a port.   The port can stay in for as long as your health care provider believes it is necessary.   A home health care nurse may give medicines and take care of the port.   You or a family member can get special training and directions for giving medicine and taking care of the port at home.  SEEK MEDICAL CARE IF:  Your port does not flush or you are unable to get a blood return.   SEEK IMMEDIATE MEDICAL CARE IF:  You have new fluid or pus coming from your incision.   You notice a bad smell coming from your incision site.   You have swelling, pain, or more redness at the incision or port site.   You have a fever or chills.   You have chest pain or shortness of breath. Document Released: 01/23/2013 Document Reviewed: 12/10/2012 Exit[employer] Patient Information 2014 [employer].  

## 2013-08-15 NOTE — Anesthesia Procedure Notes (Addendum)
Procedure Name: Intubation Date/Time: 08/15/2013 11:02 AM Performed by: Ned Grace Pre-anesthesia Checklist: Patient identified, Patient being monitored, Emergency Drugs available, Timeout performed and Suction available Patient Re-evaluated:Patient Re-evaluated prior to inductionOxygen Delivery Method: Circle system utilized Preoxygenation: Pre-oxygenation with 100% oxygen Intubation Type: IV induction Ventilation: Two handed mask ventilation required and Oral airway inserted - appropriate to patient size Laryngoscope size: GS #4. Grade View: Grade I Tube type: Oral Tube size: 7.5 mm Number of attempts: 1 Airway Equipment and Method: Video-laryngoscopy,  Bite block,  Oral airway and Rigid stylet Placement Confirmation: ETT inserted through vocal cords under direct vision,  breath sounds checked- equal and bilateral and positive ETCO2 Secured at: 24 cm Tube secured with: Tape Dental Injury: Teeth and Oropharynx as per pre-operative assessment and Bloody posterior oropharynx  Comments: Please note that oropharyngeal mucosa is quite friable. ETT and OPA easily advanced without resistance.

## 2013-08-15 NOTE — Interval H&P Note (Signed)
History and Physical Interval Note:  08/15/2013 10:11 AM  Mason [name]  has presented today for surgery, with the diagnosis of tonsillar cancer  The various methods of treatment have been discussed with the patient and family. After consideration of risks, benefits and other options for treatment, the patient has consented to  Procedure(s): G-TUBE PLACEMENT (N/A) INSERTION PORT-A-CATH (N/A) as a surgical intervention .  The patient's history has been reviewed, patient examined, no change in status, stable for surgery.  I have reviewed the patient's chart and labs.  Questions were answered to the patient's satisfaction.     [name]

## 2013-08-15 NOTE — Transfer of Care (Signed)
Immediate Anesthesia Transfer of Care Note  Patient: Mason [name]  Procedure(s) Performed: Procedure(s): LAPAROSCOPIC GASTROSTOMY TUBE PLACEMENT (N/A) INSERTION PORT-A-CATH (Left)  Patient Location: PACU  Anesthesia Type:General  Level of Consciousness: awake, alert , oriented and patient cooperative  Airway & Oxygen Therapy: Patient Spontanous Breathing and Patient connected to face mask oxygen  Post-op Assessment: Report given to PACU RN, Post -op Vital signs reviewed and stable and Patient moving all extremities  Post vital signs: Reviewed and stable  Complications: No apparent anesthesia complications

## 2013-08-15 NOTE — H&P (View-Only) (Signed)
Patient ID: Mason [name], male   DOB: [birth year], 48 y.o.   MRN: [id number]  Chief Complaint  Patient presents with  . tonsillar cancer    HPI Mason [name] is a 48 y.o. male.  The patient is a 48 year old who is referred by Dr. Alvy Bimler for a Port-A-Cath placement and laparoscopic G-tube placement. Patient has had a history of right throat pain. He's had a biopsy which revealed tonsillar cancer. HPI  Past Medical History  Diagnosis Date  . 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 Knee pain, chronic   . Anxiety     mild new dx  . Arthritis     knees,hips  . Concussion     Hx: in high school x 2    Past Surgical History  Procedure Laterality Date  . Multiple extractions with alveoloplasty N/A 08/01/2013    Procedure: Extraction of tooth #'s 1,15,17,31, 32 with alveoloplasty, mandibular left torus reduction, and gross debridement of remaining teeth.;  Surgeon: [name], DDS;  Location: [facility];  Service: Oral Surgery;  Laterality: N/A;    Family History  Problem Relation Age of Onset  . CVA    . Diabetes    . Heart attack    . Arthritis Father   . Arthritis Mother     Social History History  Substance Use Topics  . Smoking status: Former Smoker -- 1.00 packs/day for 20 years    Types: Cigarettes    Quit date: 07/16/2003  . Smokeless tobacco: Never Used  . Alcohol Use: 7.2 oz/week    12 Cans of beer per week     Comment: social [place], beer  "2 weekends out of the month"    No Known Allergies  Current Outpatient Prescriptions  Medication Sig Dispense Refill  . amLODipine (NORVASC) 2.5 MG tablet Take 1 tablet (2.5 mg total) by mouth daily.  30 tablet  0  . amoxicillin-clavulanate (AUGMENTIN) 875-125 MG per tablet Take 1 tablet by mouth 2 (two) times daily. He is to take for 10 days and had it filled on 08/02/13.      . naproxen (NAPROSYN) 500 MG tablet Take 1 tablet (500 mg total) by mouth 2 (two) times daily with a meal.  30 tablet  2  .  oxyCODONE-acetaminophen (PERCOCET) 5-325 MG per tablet Take one or two tablets by mouth every 4-6 hours as needed for pain.  40 tablet  0  . sodium fluoride (FLUORISHIELD) 1.1 % GEL dental gel Instill one drop into each tooth space of fluoride tray. Place over teeth for 5 minutes. Remove. Spit out excess. Repeat nightly.  120 mL  prn   No current facility-administered medications for this visit.    Review of Systems Review of Systems  Constitutional: Negative.   HENT: Negative.   Eyes: Negative.   Respiratory: Negative.   Cardiovascular: Negative.   Gastrointestinal: Negative.   Endocrine: Negative.   Neurological: Negative.     Blood pressure 158/100, pulse 88, resp. rate 16, height 6\' 7"  (2.007 m), weight 328 lb 9.6 oz (149.052 kg).  Physical Exam Physical Exam  Constitutional: He is oriented to person, place, and time. He appears well-developed and well-nourished.  HENT:  Head: Normocephalic and atraumatic.  Eyes: Conjunctivae and EOM are normal. Pupils are equal, round, and reactive to light.  Neck: Normal range of motion. Neck supple.  Cardiovascular: Normal rate, regular rhythm and normal heart sounds.   Pulmonary/Chest: Effort normal and breath sounds normal.  Musculoskeletal: Normal range of motion.  Neurological: He is alert and oriented to person, place, and time.  Skin: Skin is warm and dry.    Data Reviewed As above  Assessment    48 year old male with tonsillar cancer     Plan    1. We'll proceed to the operating room for left-sided Port-A-Cath placement as well as a laparoscopic G-tube placement. 2. I discussed with the patient the risks and benefits of the procedure to include but not limited to infection, bleeding, possible pneumothorax, possible leakage of the G-tube and/or need for replacement. The patient voiced understanding and wishes to proceed.        [name] 08/12/2013, 5:17 PM

## 2013-08-16 ENCOUNTER — Other Ambulatory Visit: Payer: Self-pay

## 2013-08-16 ENCOUNTER — Telehealth: Payer: Self-pay | Admitting: Hematology and Oncology

## 2013-08-16 ENCOUNTER — Ambulatory Visit: Payer: Self-pay | Admitting: Hematology and Oncology

## 2013-08-16 ENCOUNTER — Other Ambulatory Visit: Payer: Self-pay | Admitting: Hematology and Oncology

## 2013-08-16 ENCOUNTER — Encounter (HOSPITAL_COMMUNITY): Payer: Self-pay | Admitting: General Surgery

## 2013-08-16 ENCOUNTER — Other Ambulatory Visit: Payer: Self-pay | Admitting: *Deleted

## 2013-08-16 ENCOUNTER — Telehealth: Payer: Self-pay | Admitting: *Deleted

## 2013-08-16 DIAGNOSIS — C099 Malignant neoplasm of tonsil, unspecified: Secondary | ICD-10-CM

## 2013-08-16 DIAGNOSIS — Z510 Encounter for antineoplastic radiation therapy: Secondary | ICD-10-CM | POA: Diagnosis not present

## 2013-08-16 NOTE — Telephone Encounter (Signed)
Called patient re: missed lab and UT appt w/ Dr. Alvy Bimler this afternoon, "I forgot".  Discussed with Dr. Alvy Bimler, she rescheduled appts for Monday.  I called pt, LVM indicating 5/4 8:30 lab, 9:15 appt w/ Dr. Alvy Bimler. Continuing navigation as L1 patient (new patient).  Gayleen Orem, RN, BSN, [facility] Neck Oncology Navigator [phone]

## 2013-08-16 NOTE — Progress Notes (Signed)
The patient's IMRT plan was signed. Of note, the brachial plexus dose exceeds departmental guidelines in order to provide adequate dose to his right neck adenopathy. 0.2cc of the plexus is receiving 68Gy. Even so, some of the neck adenopathy is receiving less than 70 Gy.  I feel this is a prudent balance between providing adequate dose to his disease while sparing his normal tissues.  -----------------------------------  Eppie Gibson, MD

## 2013-08-16 NOTE — Telephone Encounter (Signed)
R/s pt per MD, notified Cameo RN

## 2013-08-19 ENCOUNTER — Encounter: Payer: Self-pay | Admitting: Hematology and Oncology

## 2013-08-19 ENCOUNTER — Ambulatory Visit
Admission: RE | Admit: 2013-08-19 | Discharge: 2013-08-19 | Disposition: A | Discharge status: Home / Self Care | Payer: Medicaid Other | Source: Ambulatory Visit | Attending: Radiation Oncology | Admitting: Radiation Oncology

## 2013-08-19 ENCOUNTER — Ambulatory Visit (HOSPITAL_BASED_OUTPATIENT_CLINIC_OR_DEPARTMENT_OTHER): Payer: Medicaid Other | Admitting: Hematology and Oncology

## 2013-08-19 ENCOUNTER — Ambulatory Visit (HOSPITAL_BASED_OUTPATIENT_CLINIC_OR_DEPARTMENT_OTHER): Payer: Medicaid Other

## 2013-08-19 ENCOUNTER — Telehealth: Payer: Self-pay | Admitting: Hematology and Oncology

## 2013-08-19 ENCOUNTER — Ambulatory Visit: Payer: Medicaid Other | Admitting: Nutrition

## 2013-08-19 ENCOUNTER — Ambulatory Visit (HOSPITAL_COMMUNITY): Payer: Self-pay | Admitting: Dentistry

## 2013-08-19 ENCOUNTER — Other Ambulatory Visit: Payer: Self-pay | Admitting: *Deleted

## 2013-08-19 ENCOUNTER — Other Ambulatory Visit (HOSPITAL_BASED_OUTPATIENT_CLINIC_OR_DEPARTMENT_OTHER): Payer: Medicaid Other

## 2013-08-19 ENCOUNTER — Other Ambulatory Visit: Payer: Medicaid Other

## 2013-08-19 ENCOUNTER — Encounter: Payer: Self-pay | Admitting: *Deleted

## 2013-08-19 VITALS — BP 135/85 | HR 96 | Temp 98.2°F | Resp 20 | Ht 79.0 in | Wt 318.6 lb

## 2013-08-19 DIAGNOSIS — C099 Malignant neoplasm of tonsil, unspecified: Secondary | ICD-10-CM

## 2013-08-19 DIAGNOSIS — R634 Abnormal weight loss: Secondary | ICD-10-CM

## 2013-08-19 DIAGNOSIS — B977 Papillomavirus as the cause of diseases classified elsewhere: Secondary | ICD-10-CM

## 2013-08-19 DIAGNOSIS — Z5111 Encounter for antineoplastic chemotherapy: Secondary | ICD-10-CM

## 2013-08-19 DIAGNOSIS — Z510 Encounter for antineoplastic radiation therapy: Secondary | ICD-10-CM | POA: Diagnosis not present

## 2013-08-19 LAB — COMPREHENSIVE METABOLIC PANEL (CC13)
ALK PHOS: 67 U/L (ref 40–150)
ALT: 50 U/L (ref 0–55)
AST: 40 U/L — AB (ref 5–34)
Albumin: 3.9 g/dL (ref 3.5–5.0)
Anion Gap: 9 mEq/L (ref 3–11)
BILIRUBIN TOTAL: 0.6 mg/dL (ref 0.20–1.20)
BUN: 12.2 mg/dL (ref 7.0–26.0)
CO2: 27 mEq/L (ref 22–29)
Calcium: 10.1 mg/dL (ref 8.4–10.4)
Chloride: 103 mEq/L (ref 98–109)
Creatinine: 1.3 mg/dL (ref 0.7–1.3)
Glucose: 101 mg/dl (ref 70–140)
POTASSIUM: 4.5 meq/L (ref 3.5–5.1)
Sodium: 139 mEq/L (ref 136–145)
Total Protein: 7.8 g/dL (ref 6.4–8.3)

## 2013-08-19 LAB — CBC WITH DIFFERENTIAL/PLATELET
BASO%: 0.3 % (ref 0.0–2.0)
BASOS ABS: 0 10*3/uL (ref 0.0–0.1)
EOS ABS: 0.1 10*3/uL (ref 0.0–0.5)
EOS%: 0.8 % (ref 0.0–7.0)
HEMATOCRIT: 42.1 % (ref 38.4–49.9)
HEMOGLOBIN: 13.8 g/dL (ref 13.0–17.1)
LYMPH%: 13.6 % — AB (ref 14.0–49.0)
MCH: 29.3 pg (ref 27.2–33.4)
MCHC: 32.7 g/dL (ref 32.0–36.0)
MCV: 89.5 fL (ref 79.3–98.0)
MONO#: 1.1 10*3/uL — ABNORMAL HIGH (ref 0.1–0.9)
MONO%: 9 % (ref 0.0–14.0)
NEUT%: 76.3 % — AB (ref 39.0–75.0)
NEUTROS ABS: 9.6 10*3/uL — AB (ref 1.5–6.5)
PLATELETS: 251 10*3/uL (ref 140–400)
RBC: 4.7 10*6/uL (ref 4.20–5.82)
RDW: 13.3 % (ref 11.0–14.6)
WBC: 12.6 10*3/uL — ABNORMAL HIGH (ref 4.0–10.3)
lymph#: 1.7 10*3/uL (ref 0.9–3.3)

## 2013-08-19 LAB — MAGNESIUM (CC13): Magnesium: 2.3 mg/dl (ref 1.5–2.5)

## 2013-08-19 MED ORDER — SODIUM CHLORIDE 0.9 % IV SOLN
98.5000 mg/m2 | Freq: Once | INTRAVENOUS | Status: AC
  Administered 2013-08-19: 280 mg via INTRAVENOUS
  Filled 2013-08-19: qty 280

## 2013-08-19 MED ORDER — PROCHLORPERAZINE MALEATE 10 MG PO TABS: 10.0000 mg | ORAL_TABLET | Freq: Four times a day (QID) | ORAL | Status: DC | PRN

## 2013-08-19 MED ORDER — SODIUM CHLORIDE 0.9 % IJ SOLN
10.0000 mL | INTRAMUSCULAR | Status: DC | PRN
  Administered 2013-08-19: 10 mL
  Filled 2013-08-19: qty 10

## 2013-08-19 MED ORDER — SODIUM CHLORIDE 0.9 % IV SOLN
150.0000 mg | Freq: Once | INTRAVENOUS | Status: AC
  Administered 2013-08-19: 150 mg via INTRAVENOUS
  Filled 2013-08-19: qty 5

## 2013-08-19 MED ORDER — POTASSIUM CHLORIDE 2 MEQ/ML IV SOLN
Freq: Once | INTRAVENOUS | Status: AC
  Administered 2013-08-19: 11:00:00 via INTRAVENOUS
  Filled 2013-08-19: qty 10

## 2013-08-19 MED ORDER — LIDOCAINE-PRILOCAINE 2.5-2.5 % EX CREA: 1.0000 "application " | TOPICAL_CREAM | CUTANEOUS | Status: DC | PRN

## 2013-08-19 MED ORDER — PALONOSETRON HCL INJECTION 0.25 MG/5ML
0.2500 mg | Freq: Once | INTRAVENOUS | Status: AC
  Administered 2013-08-19: 0.25 mg via INTRAVENOUS

## 2013-08-19 MED ORDER — HEPARIN SOD (PORK) LOCK FLUSH 100 UNIT/ML IV SOLN
500.0000 [IU] | Freq: Once | INTRAVENOUS | Status: AC | PRN
  Administered 2013-08-19: 500 [IU]
  Filled 2013-08-19: qty 5

## 2013-08-19 MED ORDER — ONDANSETRON HCL 8 MG PO TABS: 8.0000 mg | ORAL_TABLET | Freq: Three times a day (TID) | ORAL | Status: DC | PRN

## 2013-08-19 MED ORDER — DEXAMETHASONE SODIUM PHOSPHATE 20 MG/5ML IJ SOLN
12.0000 mg | Freq: Once | INTRAMUSCULAR | Status: AC
  Administered 2013-08-19: 12 mg via INTRAVENOUS

## 2013-08-19 NOTE — Patient Instructions (Signed)
Cisplatin injection What is this medicine? CISPLATIN (SIS pla tin) is a chemotherapy drug. It targets fast dividing cells, like cancer cells, and causes these cells to die. This medicine is used to treat many types of cancer like bladder, ovarian, and testicular cancers. This medicine may be used for other purposes; ask your health care provider or pharmacist if you have questions. COMMON BRAND NAME(S): Platinol -AQ, Platinol What should I tell my health care provider before I take this medicine? They need to know if you have any of these conditions: -blood disorders -hearing problems -kidney disease -recent or ongoing radiation therapy -an unusual or allergic reaction to cisplatin, carboplatin, other chemotherapy, other medicines, foods, dyes, or preservatives -pregnant or trying to get pregnant -breast-feeding How should I use this medicine? This drug is given as an infusion into a vein. It is administered in a hospital or clinic by a specially trained health care professional. Talk to your pediatrician regarding the use of this medicine in children. Special care may be needed. Overdosage: If you think you have taken too much of this medicine contact a poison control center or emergency room at once. NOTE: This medicine is only for you. Do not share this medicine with others. What if I miss a dose? It is important not to miss a dose. Call your doctor or health care professional if you are unable to keep an appointment. What may interact with this medicine? -dofetilide -foscarnet -medicines for seizures -medicines to increase blood counts like filgrastim, pegfilgrastim, sargramostim -probenecid -pyridoxine used with altretamine -rituximab -some antibiotics like amikacin, gentamicin, neomycin, polymyxin B, streptomycin, tobramycin -sulfinpyrazone -vaccines -zalcitabine Talk to your doctor or health care professional before taking any of these  medicines: -acetaminophen -aspirin -ibuprofen -ketoprofen -naproxen This list may not describe all possible interactions. Give your health care provider a list of all the medicines, herbs, non-prescription drugs, or dietary supplements you use. Also tell them if you smoke, drink alcohol, or use illegal drugs. Some items may interact with your medicine. What should I watch for while using this medicine? Your condition will be monitored carefully while you are receiving this medicine. You will need important blood work done while you are taking this medicine. This drug may make you feel generally unwell. This is not uncommon, as chemotherapy can affect healthy cells as well as cancer cells. Report any side effects. Continue your course of treatment even though you feel ill unless your doctor tells you to stop. In some cases, you may be given additional medicines to help with side effects. Follow all directions for their use. Call your doctor or health care professional for advice if you get a fever, chills or sore throat, or other symptoms of a cold or flu. Do not treat yourself. This drug decreases your body's ability to fight infections. Try to avoid being around people who are sick. This medicine may increase your risk to bruise or bleed. Call your doctor or health care professional if you notice any unusual bleeding. Be careful brushing and flossing your teeth or using a toothpick because you may get an infection or bleed more easily. If you have any dental work done, tell your dentist you are receiving this medicine. Avoid taking products that contain aspirin, acetaminophen, ibuprofen, naproxen, or ketoprofen unless instructed by your doctor. These medicines may hide a fever. Do not become pregnant while taking this medicine. Women should inform their doctor if they wish to become pregnant or think they might be pregnant. There is a   potential for serious side effects to an unborn child. Talk to  your health care professional or pharmacist for more information. Do not breast-feed an infant while taking this medicine. Drink fluids as directed while you are taking this medicine. This will help protect your kidneys. Call your doctor or health care professional if you get diarrhea. Do not treat yourself. What side effects may I notice from receiving this medicine? Side effects that you should report to your doctor or health care professional as soon as possible: -allergic reactions like skin rash, itching or hives, swelling of the face, lips, or tongue -signs of infection - fever or chills, cough, sore throat, pain or difficulty passing urine -signs of decreased platelets or bleeding - bruising, pinpoint red spots on the skin, black, tarry stools, nosebleeds -signs of decreased red blood cells - unusually weak or tired, fainting spells, lightheadedness -breathing problems -changes in hearing -gout pain -low blood counts - This drug may decrease the number of white blood cells, red blood cells and platelets. You may be at increased risk for infections and bleeding. -nausea and vomiting -pain, swelling, redness or irritation at the injection site -pain, tingling, numbness in the hands or feet -problems with balance, movement -trouble passing urine or change in the amount of urine Side effects that usually do not require medical attention (report to your doctor or health care professional if they continue or are bothersome): -changes in vision -loss of appetite -metallic taste in the mouth or changes in taste This list may not describe all possible side effects. Call your doctor for medical advice about side effects. You may report side effects to FDA at 1-800-FDA-1088. Where should I keep my medicine? This drug is given in a hospital or clinic and will not be stored at home. NOTE: This sheet is a summary. It may not cover all possible information. If you have questions about this medicine,  talk to your doctor, pharmacist, or health care provider.  2014, [employer]vier/Gold Standard. (2007-07-10 14:40:54)  

## 2013-08-19 NOTE — Patient Instructions (Signed)
[facility] Discharge Instructions for Patients Receiving Chemotherapy  Today you received the following chemotherapy agents Cisplatin To help prevent nausea and vomiting after your treatment, we encourage you to take your nausea medication as directed/prescribed    If you develop nausea and vomiting that is not controlled by your nausea medication, call the clinic.   BELOW ARE SYMPTOMS THAT SHOULD BE REPORTED IMMEDIATELY:  *FEVER GREATER THAN 100.5 F  *CHILLS WITH OR WITHOUT FEVER  NAUSEA AND VOMITING THAT IS NOT CONTROLLED WITH YOUR NAUSEA MEDICATION  *UNUSUAL SHORTNESS OF BREATH  *UNUSUAL BRUISING OR BLEEDING  TENDERNESS IN MOUTH AND THROAT WITH OR WITHOUT PRESENCE OF ULCERS  *URINARY PROBLEMS  *BOWEL PROBLEMS  UNUSUAL RASH Items with * indicate a potential emergency and should be followed up as soon as possible.  Feel free to call the clinic you have any questions or concerns. The clinic phone number is [phone].    

## 2013-08-19 NOTE — Telephone Encounter (Signed)
gve the pt his may 2015 appt calendar °

## 2013-08-19 NOTE — Progress Notes (Signed)
Encounter for treatment review and patient education.

## 2013-08-19 NOTE — Progress Notes (Signed)
IMRT Device Note Outpatient   10.6 delivered field widths represent one set of IMRT treatment devices. The codeis 76546.  -----------------------------------  Eppie Gibson, MD

## 2013-08-19 NOTE — Progress Notes (Signed)
   Weekly Management Note:  outpatient Current Dose:  2 Gy  Projected Dose: 70 Gy   Narrative:  The patient presents for routine under treatment assessment.  CBCT/MVCT images/Port film x-rays were reviewed.  The chart was checked. No new complaints.  R axillary biopsy was negative.  Chemotherapy starts soon and therefore patient seen today at machine  Physical Findings:  vitals were not taken for this visit. No oral thrush. Palpable adenopathy most pronounced in right cervical neck  CBC    Component Value Date/Time   WBC 12.6* 08/19/2013 0831   WBC 8.7 08/06/2013 0920   RBC 4.70 08/19/2013 0831   RBC 4.67 08/06/2013 0920   HGB 13.8 08/19/2013 0831   HGB 13.9 08/06/2013 0920   HCT 42.1 08/19/2013 0831   HCT 40.8 08/06/2013 0920   PLT 251 08/19/2013 0831   PLT 212 08/06/2013 0920   MCV 89.5 08/19/2013 0831   MCV 87.4 08/06/2013 0920   MCH 29.3 08/19/2013 0831   MCH 29.8 08/06/2013 0920   MCHC 32.7 08/19/2013 0831   MCHC 34.1 08/06/2013 0920   RDW 13.3 08/19/2013 0831   RDW 12.8 08/06/2013 0920   LYMPHSABS 1.7 08/19/2013 0831   MONOABS 1.1* 08/19/2013 0831   EOSABS 0.1 08/19/2013 0831   BASOSABS 0.0 08/19/2013 0831     CMP     Component Value Date/Time   NA 139 08/19/2013 0831   NA 137 08/02/2013 0340   K 4.5 08/19/2013 0831   K 3.8 08/02/2013 0340   CL 99 08/02/2013 0340   CO2 27 08/19/2013 0831   CO2 24 08/02/2013 0340   GLUCOSE 101 08/19/2013 0831   GLUCOSE 101* 08/02/2013 0340   BUN 12.2 08/19/2013 0831   BUN 12 08/02/2013 0340   CREATININE 1.3 08/19/2013 0831   CREATININE 1.12 08/02/2013 0340   CALCIUM 10.1 08/19/2013 0831   CALCIUM 9.1 08/02/2013 0340   PROT 7.8 08/19/2013 0831   PROT 7.4 07/31/2013 1140   ALBUMIN 3.9 08/19/2013 0831   ALBUMIN 4.0 07/31/2013 1140   AST 40* 08/19/2013 0831   AST 41* 07/31/2013 1140   ALT 50 08/19/2013 0831   ALT 55* 07/31/2013 1140   ALKPHOS 67 08/19/2013 0831   ALKPHOS 59 07/31/2013 1140   BILITOT 0.60 08/19/2013 0831   BILITOT 0.3 07/31/2013 1140   GFRNONAA 78* 08/02/2013 0340   GFRAA  >90 08/02/2013 0340     Impression:  The patient is tolerating radiotherapy.   Plan:  Continue radiotherapy as planned.    -----------------------------------  Eppie Gibson, MD

## 2013-08-19 NOTE — Progress Notes (Signed)
Notified Lurlean Leyden,  RN coordinator w/ [facility] of new order for teaching care/use of new PEG tube.

## 2013-08-19 NOTE — Progress Notes (Signed)
Saw patient in the chemotherapy area.  Patient reports he is status post PEG placement.  He states he has not had any instructions on how to flush or care for his tube.  Patient noted to have 5% weight loss over 5 weeks.  Weight documented as 318.6 pounds down from 333.8 pounds March 31.  Patient reports appetite comes and goes.  Nutrition diagnosis: Food and nutrition related knowledge deficit continues.  Intervention: Patient was very sleepy and could not stay awake.  Nursing did instruct patient on flushing his feeding tube.  I enforced and recommended patient consume adequate protein and calories to minimize weight loss.  Briefly described tube feeding instructions but will review with patient at a later time.  Teach back method used.  Monitoring, evaluation, goals: Patient has had decreased oral intake and has lost 5% of his body weight.  Next visit: Tuesday, May 26, during chemotherapy.

## 2013-08-19 NOTE — Progress Notes (Signed)
[place] OFFICE PROGRESS NOTE  Patient Care Team: Chari Manning, NP as PCP - General (Internal Medicine) [name] Patient (General Practice) Brooks Sailors, RN as Registered Nurse (Oncology) [name], MD as Referring Physician (Otolaryngology) [name], MD as Consulting Physician (Hematology and Oncology)  DIAGNOSIS: Tonsil cancer  SUMMARY OF ONCOLOGIC HISTORY: Oncology History   Tonsillar cancer, HPV positive   Primary site: Pharynx - Oropharynx (Right)   Staging method: AJCC 7th Edition   Clinical: Stage IVA (T2, N2b, M0) signed by [name], MD on 08/19/2013  1:24 PM   Summary: Stage IVA (T2, N2b, M0)       Tonsillar cancer   07/09/2013 Procedure 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 FNA of right level III lymph node was inconclusive for cancer   07/25/2013 Imaging PET scan showed locally advanced disease with abnormal lymphadenopathy in the right axilla   08/06/2013 Procedure CT-guided biopsy of the lymphadenopathy was negative for malignancy   08/15/2013 Surgery Patient has placement of port and feeding tube   08/19/2013 -  Chemotherapy Patient received chemotherapy with cisplatin   08/19/2013 -  Radiation Therapy Patient received radiation treatment    INTERVAL HISTORY: Mason WEISINGER 48 y.o. male returns for further followup. He complain of sinus congestion and drainage. He has occasional gagging due to the nasal drainage.  I have reviewed the past medical history, past surgical history, social history and family history with the patient and they are unchanged from previous note.  ALLERGIES:  has No Known Allergies.  MEDICATIONS:  Current Outpatient Prescriptions  Medication Sig Dispense Refill  . amLODipine (NORVASC) 2.5 MG tablet Take 1 tablet (2.5 mg total) by mouth daily.  30 tablet  0  . naproxen (NAPROSYN) 500 MG tablet Take 1 tablet (500 mg total) by mouth 2 (two) times daily with a meal.   30 tablet  2  . oxyCODONE-acetaminophen (PERCOCET) 10-325 MG per tablet Take 1 tablet by mouth every 4 (four) hours as needed for pain.  30 tablet  0  . sodium fluoride (FLUORISHIELD) 1.1 % GEL dental gel Instill one drop into each tooth space of fluoride tray. Place over teeth for 5 minutes. Remove. Spit out excess. Repeat nightly.  120 mL  prn  . lidocaine-prilocaine (EMLA) cream Apply 1 application topically as needed.  30 g  0  . ondansetron (ZOFRAN) 8 MG tablet Take 1 tablet (8 mg total) by mouth every 8 (eight) hours as needed for nausea or vomiting.  60 tablet  1  . prochlorperazine (COMPAZINE) 10 MG tablet Take 1 tablet (10 mg total) by mouth every 6 (six) hours as needed (Nausea or vomiting).  30 tablet  1   No current facility-administered medications for this visit.   Facility-Administered Medications Ordered in Other Visits  Medication Dose Route Frequency Provider Last Rate Last Dose  . fosaprepitant (EMEND) 150 mg in sodium chloride 0.9 % 145 mL IVPB  150 mg Intravenous Once [name], MD 300 mL/hr at 08/19/13 1304 150 mg at 08/19/13 1304  . heparin lock flush 100 unit/mL  500 Units Intracatheter Once PRN [name], MD      . sodium chloride 0.9 % injection 10 mL  10 mL Intracatheter PRN [name], MD        REVIEW OF SYSTEMS:   Constitutional: Denies fevers, chills or abnormal weight loss Eyes: Denies blurriness of vision Ears, nose, mouth, throat, and face: Denies mucositis or sore throat  Respiratory: Denies cough, dyspnea or wheezes Cardiovascular: Denies palpitation, chest discomfort or lower extremity swelling Gastrointestinal:  Denies nausea, heartburn or change in bowel habits Skin: Denies abnormal skin rashes Lymphatics: Denies new lymphadenopathy or easy bruising Neurological:Denies numbness, tingling or new weaknesses Behavioral/Psych: Mood is stable, no new changes  All other systems were reviewed with the patient and are negative.  PHYSICAL EXAMINATION: ECOG  PERFORMANCE STATUS: 0 - Asymptomatic  Filed Vitals:   08/19/13 0845  BP: 135/85  Pulse: 96  Temp: 98.2 F (36.8 C)  Resp: 20   Filed Weights   08/19/13 0845  Weight: 318 lb 9.6 oz (144.516 kg)    GENERAL:alert, no distress and comfortable. He is morbidly obese SKIN: skin color, texture, turgor are normal, no rashes or significant lesions EYES: normal, Conjunctiva are pink and non-injected, sclera clear OROPHARYNX: A large oropharyngeal mass in the tonsil area NECK: supple, thyroid normal size, non-tender, without nodularity LYMPH:  Palpable lymphadenopathy on both sides of the neck  LUNGS: clear to auscultation and percussion with normal breathing effort HEART: regular rate & rhythm and no murmurs and no lower extremity edema ABDOMEN:abdomen soft, non-tender and normal bowel sounds Musculoskeletal:no cyanosis of digits and no clubbing  NEURO: alert & oriented x 3 with fluent speech, no focal motor/sensory deficits  LABORATORY DATA:  I have reviewed the data as listed    Component Value Date/Time   NA 139 08/19/2013 0831   NA 137 08/02/2013 0340   K 4.5 08/19/2013 0831   K 3.8 08/02/2013 0340   CL 99 08/02/2013 0340   CO2 27 08/19/2013 0831   CO2 24 08/02/2013 0340   GLUCOSE 101 08/19/2013 0831   GLUCOSE 101* 08/02/2013 0340   BUN 12.2 08/19/2013 0831   BUN 12 08/02/2013 0340   CREATININE 1.3 08/19/2013 0831   CREATININE 1.12 08/02/2013 0340   CALCIUM 10.1 08/19/2013 0831   CALCIUM 9.1 08/02/2013 0340   PROT 7.8 08/19/2013 0831   PROT 7.4 07/31/2013 1140   ALBUMIN 3.9 08/19/2013 0831   ALBUMIN 4.0 07/31/2013 1140   AST 40* 08/19/2013 0831   AST 41* 07/31/2013 1140   ALT 50 08/19/2013 0831   ALT 55* 07/31/2013 1140   ALKPHOS 67 08/19/2013 0831   ALKPHOS 59 07/31/2013 1140   BILITOT 0.60 08/19/2013 0831   BILITOT 0.3 07/31/2013 1140   GFRNONAA 78* 08/02/2013 0340   GFRAA >90 08/02/2013 0340    No results found for this basename: SPEP, UPEP,  kappa and lambda light chains    Lab Results   Component Value Date   WBC 12.6* 08/19/2013   NEUTROABS 9.6* 08/19/2013   HGB 13.8 08/19/2013   HCT 42.1 08/19/2013   MCV 89.5 08/19/2013   PLT 251 08/19/2013      Chemistry      Component Value Date/Time   NA 139 08/19/2013 0831   NA 137 08/02/2013 0340   K 4.5 08/19/2013 0831   K 3.8 08/02/2013 0340   CL 99 08/02/2013 0340   CO2 27 08/19/2013 0831   CO2 24 08/02/2013 0340   BUN 12.2 08/19/2013 0831   BUN 12 08/02/2013 0340   CREATININE 1.3 08/19/2013 0831   CREATININE 1.12 08/02/2013 0340      Component Value Date/Time   CALCIUM 10.1 08/19/2013 0831   CALCIUM 9.1 08/02/2013 0340   ALKPHOS 67 08/19/2013 0831   ALKPHOS 59 07/31/2013 1140   AST 40* 08/19/2013 0831   AST 41* 07/31/2013 1140   ALT 50 08/19/2013 0831  ALT 55* 07/31/2013 1140   BILITOT 0.60 08/19/2013 0831   BILITOT 0.3 07/31/2013 1140      ASSESSMENT & PLAN:  #1 tonsil cancer We discussed the role of chemotherapy. The intent is for cure with plan of 3 cycles of chemotherapy to be given with radiation.  We discussed some of the risks, benefits, side-effects of cisplatin. Some of the short term side-effects included, though not limited to, including weight loss, life threatening infections, risk of allergic reactions, need for transfusions of blood products, nausea, vomiting, change in bowel habits, loss of hair, admission to hospital for various reasons, and risks of death.   Long term side-effects are also discussed including risks of infertility, permanent damage to nerve function, hearing loss, chronic fatigue, kidney damage with possibility needing hemodialysis, and rare secondary malignancy including bone marrow disorders.  The patient is aware that the response rates discussed earlier is not guaranteed.  After a long discussion, patient made an informed decision to proceed with the prescribed plan of care and went ahead to sign the consent form today.   Patient education material was dispensed.  #2 weight loss I will consult about home  care for feeding tube supplies   All questions were answered. The patient knows to call the clinic with any problems, questions or concerns. No barriers to learning was detected. I spent 40 minutes counseling the patient face to face. The total time spent in the appointment was 55 minutes and more than 50% was on counseling and review of test results     [name], MD 08/19/2013 1:25 PM

## 2013-08-20 ENCOUNTER — Ambulatory Visit (HOSPITAL_COMMUNITY): Payer: Medicaid - Dental | Admitting: Dentistry

## 2013-08-20 ENCOUNTER — Telehealth: Payer: Self-pay | Admitting: *Deleted

## 2013-08-20 ENCOUNTER — Ambulatory Visit
Admission: RE | Admit: 2013-08-20 | Discharge: 2013-08-20 | Disposition: A | Discharge status: Home / Self Care | Payer: Medicaid Other | Source: Ambulatory Visit | Attending: Radiation Oncology | Admitting: Radiation Oncology

## 2013-08-20 ENCOUNTER — Encounter (HOSPITAL_COMMUNITY): Payer: Self-pay | Admitting: Dentistry

## 2013-08-20 VITALS — BP 144/101 | HR 86 | Temp 98.7°F

## 2013-08-20 DIAGNOSIS — M278 Other specified diseases of jaws: Secondary | ICD-10-CM

## 2013-08-20 DIAGNOSIS — T148XXD Other injury of unspecified body region, subsequent encounter: Secondary | ICD-10-CM

## 2013-08-20 DIAGNOSIS — Z0189 Encounter for other specified special examinations: Secondary | ICD-10-CM

## 2013-08-20 DIAGNOSIS — Z510 Encounter for antineoplastic radiation therapy: Secondary | ICD-10-CM | POA: Diagnosis not present

## 2013-08-20 DIAGNOSIS — C099 Malignant neoplasm of tonsil, unspecified: Secondary | ICD-10-CM

## 2013-08-20 DIAGNOSIS — K08199 Complete loss of teeth due to other specified cause, unspecified class: Secondary | ICD-10-CM

## 2013-08-20 MED ORDER — CHLORHEXIDINE GLUCONATE 0.12 % MT SOLN: OROMUCOSAL | Status: DC

## 2013-08-20 NOTE — Patient Instructions (Signed)
PLAN: 1. Use chlorhexidine gluconate 0.12% rinses twice daily as instructed. Patient is to rinse with 15 ML's after breakfast and dinner. Patient is to swish and spit. Prescription was sent to [facility] long outpatient pharmacy.  2. Continue salt water rinses every 2 hours while awake to aid healing. Patient to use Monoject syringe as needed to assist in irrigation of the upper right and upper left quadrants. 3. Brush after meals and at bedtime. Use fluoride at bedtime. Floss at bedtime. 4. Return to clinic in 2 weeks for evaluation of healing and periodic oral examination during chemoradiation therapy. Call if problems arise before then. 5. Discussion with Dr. Alvy Bimler and Dr. Isidore Moos as needed.  [name], DDS

## 2013-08-20 NOTE — Progress Notes (Signed)
Limited oral examination:  08/20/2013 KWAN SHELLHAMMER [id number]  VITALS: BP 144/101  Pulse 86  Temp(Src) 98.7 F (37.1 C) (Oral)  LABS:  Lab Results  Component Value Date   WBC 12.6* 08/19/2013   HGB 13.8 08/19/2013   HCT 42.1 08/19/2013   MCV 89.5 08/19/2013   PLT 251 08/19/2013   BMET    Component Value Date/Time   NA 139 08/19/2013 0831   NA 137 08/02/2013 0340   K 4.5 08/19/2013 0831   K 3.8 08/02/2013 0340   CL 99 08/02/2013 0340   CO2 27 08/19/2013 0831   CO2 24 08/02/2013 0340   GLUCOSE 101 08/19/2013 0831   GLUCOSE 101* 08/02/2013 0340   BUN 12.2 08/19/2013 0831   BUN 12 08/02/2013 0340   CREATININE 1.3 08/19/2013 0831   CREATININE 1.12 08/02/2013 0340   CALCIUM 10.1 08/19/2013 0831   CALCIUM 9.1 08/02/2013 0340   GFRNONAA 78* 08/02/2013 0340   GFRAA >90 08/02/2013 0340    Lab Results  Component Value Date   INR 0.94 08/06/2013   No results found for this basename: PTT     [name] is status post extraction of multiple teeth with alveoloplasty and pre-prosthetic surgery along with gross debridement of remaining dentition the operating room on 08/01/2013. The patient was seen for a examination to evaluate healing and suture removal on 08-12-2013. Delayed healing was noted to be involving the upper left and upper right posterior molar extraction sites. The patient was instructed to continue salt water rinses every 2 hours while awake to aid healing. Patient was also given a Monoject syringe to assist in irrigation of the upper right and upper left quadrants. The patient now presents for reevaluation of healing of the dental extraction sites.  SUBJECTIVE: The patient has minimal complaints. The patient has been using salt water rinses by report. Patient is complaining of the presence of a "suture" on the lower left quadrant. Patient indicates that he started chemoradiation therapy on 08/19/2013.  EXAM: There is no sign of infection, heme, or ooze.  The upper right and upper  left extraction sites are healing in by secondary intention with soft tissue coverage now seen.  The mandibular quadrants are healing in by primary closure, but there is now in exposed area of bone involving the lower left lingual aspect area #17. This measures approximately 2 mm x 10 mm long. The exposed bone is above the level of the gum tissue. I discussed the risks, benefits, complications of no treatment versus partial ostectomy today. Patient agrees to proceed with this partial ostectomy today. I did confirm the ability to proceed with the procedure with Dr. Alvy Bimler and platelet levels are acceptable per review of labs on 08/19/2013.Marland Kitchen   PROCEDURE: The patient was given a chlorhexidine gluconate rinse for 30 seconds. Topical local anesthetic was applied to the mandibular left soft tissues. A partial ostectomy was performed with a rongeur and bone file. The bone is now down to the level of the gum tissue. The surgical site was irrigated with copious amounts of sterile saline. The patient was given a postoperative rinse with chlorhexidine gluconate. Patient tolerated the procedure well. Postop instructions were provided and a written and verbal format.  ASSESSMENT: Post operative course is complicated by delayed healing and exposed left mandible.  PLAN: 1. Use chlorhexidine gluconate 0.12% rinses twice daily as instructed. Patient is to rinse with 15 ML's after breakfast and dinner. Patient is to swish and spit. Prescription was sent to  [facility] long outpatient pharmacy.  2. Continue salt water rinses every 2 hours while awake to aid healing. Patient to use Monoject syringe as needed to assist in irrigation of the upper right and upper left quadrants. 3. Brush after meals and at bedtime. Use fluoride at bedtime. Floss at bedtime. 4. Return to clinic in 2 weeks for evaluation of healing and 4 periodic oral examination during chemoradiation therapy. Call if problems arise before then. 5. Discussion with  Dr. Alvy Bimler and Dr. Isidore Moos as needed.  [name], DDS

## 2013-08-20 NOTE — Telephone Encounter (Signed)
[place] at [phone] mobile/home number.  Message left requesting a return call for chemotherapy follow up.  Awaiting return call from patient. Scheduled for IMRT and dental procedure today.

## 2013-08-20 NOTE — Telephone Encounter (Signed)
Message copied by Cherylynn Ridges on Tue Aug 20, 2013 11:39 AM ------      Message from: Arty Baumgartner      Created: Mon Aug 19, 2013  3:53 PM      Regarding: first time chemo        First time cisplatin.  [phone]  Dr. Alvy Bimler  ------

## 2013-08-21 ENCOUNTER — Ambulatory Visit
Admission: RE | Admit: 2013-08-21 | Discharge: 2013-08-21 | Disposition: A | Discharge status: Home / Self Care | Payer: Medicaid Other | Source: Ambulatory Visit | Attending: Radiation Oncology | Admitting: Radiation Oncology

## 2013-08-21 ENCOUNTER — Ambulatory Visit (HOSPITAL_BASED_OUTPATIENT_CLINIC_OR_DEPARTMENT_OTHER): Payer: Medicaid Other

## 2013-08-21 ENCOUNTER — Other Ambulatory Visit: Payer: Self-pay | Admitting: Hematology and Oncology

## 2013-08-21 VITALS — BP 167/107 | HR 78 | Temp 97.1°F | Resp 20

## 2013-08-21 DIAGNOSIS — Z510 Encounter for antineoplastic radiation therapy: Secondary | ICD-10-CM | POA: Diagnosis not present

## 2013-08-21 DIAGNOSIS — R112 Nausea with vomiting, unspecified: Secondary | ICD-10-CM

## 2013-08-21 DIAGNOSIS — E860 Dehydration: Secondary | ICD-10-CM

## 2013-08-21 DIAGNOSIS — C099 Malignant neoplasm of tonsil, unspecified: Secondary | ICD-10-CM

## 2013-08-21 MED ORDER — ONDANSETRON 8 MG/NS 50 ML IVPB
INTRAVENOUS | Status: AC
  Filled 2013-08-21: qty 8

## 2013-08-21 MED ORDER — HEPARIN SOD (PORK) LOCK FLUSH 100 UNIT/ML IV SOLN
500.0000 [IU] | Freq: Once | INTRAVENOUS | Status: AC
  Administered 2013-08-21: 500 [IU] via INTRAVENOUS
  Filled 2013-08-21: qty 5

## 2013-08-21 MED ORDER — SODIUM CHLORIDE 0.9 % IV SOLN
INTRAVENOUS | Status: DC
  Administered 2013-08-21: 12:00:00 via INTRAVENOUS

## 2013-08-21 MED ORDER — ONDANSETRON 8 MG/50ML IVPB (CHCC)
8.0000 mg | Freq: Once | INTRAVENOUS | Status: AC
  Administered 2013-08-21: 8 mg via INTRAVENOUS

## 2013-08-21 MED ORDER — ALUM & MAG HYDROXIDE-SIMETH 200-200-20 MG/5ML PO SUSP
30.0000 mL | Freq: Once | ORAL | Status: AC
  Administered 2013-08-21: 30 mL via ORAL
  Filled 2013-08-21: qty 30

## 2013-08-21 MED ORDER — SODIUM CHLORIDE 0.9 % IJ SOLN
10.0000 mL | INTRAMUSCULAR | Status: DC | PRN
  Administered 2013-08-21: 10 mL via INTRAVENOUS
  Filled 2013-08-21: qty 10

## 2013-08-21 NOTE — Patient Instructions (Signed)
Dehydration, Adult  Dehydration means your body does not have as much fluid as it needs. Your kidneys, brain, and heart will not work properly without the right amount of fluids and salt.   HOME CARE   Ask your doctor how to replace body fluid losses (rehydrate).   Drink enough fluids to keep your pee (urine) clear or pale yellow.   Drink small amounts of fluids often if you feel sick to your stomach (nauseous) or throw up (vomit).   Eat like you normally do.   Avoid:   Foods or drinks high in sugar.   Bubbly (carbonated) drinks.   Juice.   Very hot or cold fluids.   Drinks with caffeine.   Fatty, greasy foods.   Alcohol.   Tobacco.   Eating too much.   Gelatin desserts.   Wash your hands to avoid spreading germs (bacteria, viruses).   Only take medicine as told by your doctor.   Keep all doctor visits as told.  GET HELP RIGHT AWAY IF:    You cannot drink something without throwing up.   You get worse even with treatment.   Your vomit has blood in it or looks greenish.   Your poop (stool) has blood in it or looks black and tarry.   You have not peed in 6 to 8 hours.   You pee a small amount of very dark pee.   You have a fever.   You pass out (faint).   You have belly (abdominal) pain that gets worse or stays in one spot (localizes).   You have a rash, stiff neck, or bad headache.   You get easily annoyed, sleepy, or are hard to wake up.   You feel weak, dizzy, or very thirsty.  MAKE SURE YOU:    Understand these instructions.   Will watch your condition.   Will get help right away if you are not doing well or get worse.  Document Released: 01/29/2009 Document Revised: 06/27/2011 Document Reviewed: 11/22/2010  Exit[employer] Patient Information 2014 [employer].

## 2013-08-22 ENCOUNTER — Ambulatory Visit
Admission: RE | Admit: 2013-08-22 | Discharge: 2013-08-22 | Disposition: A | Discharge status: Home / Self Care | Payer: Medicaid Other | Source: Ambulatory Visit | Attending: Radiation Oncology | Admitting: Radiation Oncology

## 2013-08-22 DIAGNOSIS — Z510 Encounter for antineoplastic radiation therapy: Secondary | ICD-10-CM | POA: Diagnosis not present

## 2013-08-23 ENCOUNTER — Ambulatory Visit
Admission: RE | Admit: 2013-08-23 | Discharge: 2013-08-23 | Disposition: A | Discharge status: Home / Self Care | Payer: Medicaid Other | Source: Ambulatory Visit | Attending: Radiation Oncology | Admitting: Radiation Oncology

## 2013-08-23 ENCOUNTER — Ambulatory Visit: Payer: MEDICAID | Attending: Internal Medicine

## 2013-08-23 ENCOUNTER — Telehealth: Payer: Self-pay | Admitting: *Deleted

## 2013-08-23 ENCOUNTER — Ambulatory Visit: Payer: Self-pay

## 2013-08-23 DIAGNOSIS — Z510 Encounter for antineoplastic radiation therapy: Secondary | ICD-10-CM | POA: Diagnosis not present

## 2013-08-23 NOTE — Telephone Encounter (Signed)
Pr POF and staff message patient to have ivf on 5/7 and 5/8. Patient scheduled for 5/8 and 5/9

## 2013-08-23 NOTE — Telephone Encounter (Signed)
Pt came up to office and s/w this RN after his Radiation today.  He states he is feeling much better, denies any nausea.  Insists he is drinking plenty of fluids.  He does not want to have IVFs today or tomorrow.  Encouraged pt to continue to take his anti emetics on regular basis to prevent nausea and drink 2 liters non caffeine fluids per day.  He verbalized understanding.  IVFs appts canceled.

## 2013-08-24 ENCOUNTER — Ambulatory Visit: Payer: Self-pay

## 2013-08-26 ENCOUNTER — Encounter: Payer: Self-pay | Admitting: Radiation Oncology

## 2013-08-26 ENCOUNTER — Ambulatory Visit
Admission: RE | Admit: 2013-08-26 | Discharge: 2013-08-26 | Disposition: A | Discharge status: Home / Self Care | Payer: Medicaid Other | Source: Ambulatory Visit | Attending: Radiation Oncology | Admitting: Radiation Oncology

## 2013-08-26 VITALS — BP 125/88 | HR 105 | Temp 97.8°F | Ht 79.0 in | Wt 308.0 lb

## 2013-08-26 DIAGNOSIS — Z510 Encounter for antineoplastic radiation therapy: Secondary | ICD-10-CM | POA: Diagnosis not present

## 2013-08-26 DIAGNOSIS — C099 Malignant neoplasm of tonsil, unspecified: Secondary | ICD-10-CM

## 2013-08-26 HISTORY — DX: Gastrostomy status: Z93.1

## 2013-08-26 MED ORDER — MAGIC MOUTHWASH W/LIDOCAINE
ORAL | Status: DC
Start: 2013-08-26 — End: 2013-08-27

## 2013-08-26 MED ORDER — SUCRALFATE 1 G PO TABS
ORAL_TABLET | ORAL | Status: DC
Start: 2013-08-26 — End: 2013-08-27

## 2013-08-26 MED ORDER — PROCHLORPERAZINE MALEATE 10 MG PO TABS: 10.0000 mg | ORAL_TABLET | Freq: Four times a day (QID) | ORAL | Status: DC | PRN

## 2013-08-26 NOTE — Progress Notes (Signed)
   Weekly Management Note:  outpatient Current Dose:  46 Gy  Projected Dose: 60 Gy   Narrative:  The patient presents for routine under treatment assessment.  CBCT/MVCT images/Port film x-rays were reviewed.  The chart was checked. He is doing better this week. Nausea improved but still present, and he is vomiting, but less so. No sore throat, yet.   Sees Dr Alvy Bimler tomorrow. Received IVF last week.  Physical Findings:  height is 6\' 7"  (2.007 m) and weight is 308 lb (139.708 kg). His temperature is 97.8 F (36.6 C). His blood pressure is 125/88 and his pulse is 105.  No oral thrush or obvious mucositis - BRISK gag reflex.  Cannot see tonsils due to this.  Skin intact.  CBC    Component Value Date/Time   WBC 12.6* 08/19/2013 0831   WBC 8.7 08/06/2013 0920   RBC 4.70 08/19/2013 0831   RBC 4.67 08/06/2013 0920   HGB 13.8 08/19/2013 0831   HGB 13.9 08/06/2013 0920   HCT 42.1 08/19/2013 0831   HCT 40.8 08/06/2013 0920   PLT 251 08/19/2013 0831   PLT 212 08/06/2013 0920   MCV 89.5 08/19/2013 0831   MCV 87.4 08/06/2013 0920   MCH 29.3 08/19/2013 0831   MCH 29.8 08/06/2013 0920   MCHC 32.7 08/19/2013 0831   MCHC 34.1 08/06/2013 0920   RDW 13.3 08/19/2013 0831   RDW 12.8 08/06/2013 0920   LYMPHSABS 1.7 08/19/2013 0831   MONOABS 1.1* 08/19/2013 0831   EOSABS 0.1 08/19/2013 0831   BASOSABS 0.0 08/19/2013 0831     CMP     Component Value Date/Time   NA 139 08/19/2013 0831   NA 137 08/02/2013 0340   K 4.5 08/19/2013 0831   K 3.8 08/02/2013 0340   CL 99 08/02/2013 0340   CO2 27 08/19/2013 0831   CO2 24 08/02/2013 0340   GLUCOSE 101 08/19/2013 0831   GLUCOSE 101* 08/02/2013 0340   BUN 12.2 08/19/2013 0831   BUN 12 08/02/2013 0340   CREATININE 1.3 08/19/2013 0831   CREATININE 1.12 08/02/2013 0340   CALCIUM 10.1 08/19/2013 0831   CALCIUM 9.1 08/02/2013 0340   PROT 7.8 08/19/2013 0831   PROT 7.4 07/31/2013 1140   ALBUMIN 3.9 08/19/2013 0831   ALBUMIN 4.0 07/31/2013 1140   AST 40* 08/19/2013 0831   AST 41* 07/31/2013 1140   ALT 50  08/19/2013 0831   ALT 55* 07/31/2013 1140   ALKPHOS 67 08/19/2013 0831   ALKPHOS 59 07/31/2013 1140   BILITOT 0.60 08/19/2013 0831   BILITOT 0.3 07/31/2013 1140   GFRNONAA 78* 08/02/2013 0340   GFRAA >90 08/02/2013 0340     Impression:  The patient is tolerating radiotherapy.   Plan:  Continue radiotherapy as planned. Instructed patient on the following elixirs: ---- Baking Soda Rinse - 1 tsp salt, 1 tsp baking soda, 1 qt water - swish gargle and spit as needed to soothe/cleanse mouth. Use as often as you want.  Sucralfate - crush 1 tablet in 10 mL H20 and swallow up to four times a day to soothe throat.  Magic mouthwash with Lidocaine - Swish/swallow 29mL, 30 minutes before meals and bedtime, to numb up sore mouth and/or throat. Alternate with Sucralfate.  -----   Compazine refilled today.   I let the patient know that my partner, Dr. Valere Dross, will be covering for me next week during my vacation.  -----------------------------------  Eppie Gibson, MD

## 2013-08-26 NOTE — Patient Instructions (Signed)
Baking Soda Rinse - 1 tsp salt, 1 tsp baking soda, 1 qt water - swish gargle and spit as needed to soothe/cleanse mouth. Use as often as you want.  Sucralfate - crush 1 tablet in 10 mL H20 and swallow up to four times a day to soothe throat.  Magic mouthwash with Lidocaine - Swish/swallow 10mL, 30 minutes before meals and bedtime, to numb up sore mouth and/or throat. Alternate with Sucralfate.  

## 2013-08-26 NOTE — Progress Notes (Addendum)
Mason [name] has received 6 fractions to his right tonsil and bilateral neck.  He denies any sore throat nor nausea today.  He admits to having nausea and gagging/vomiting x 3  in the past 24 hours.  He even started gagging during oral exam when asked to say  aaahhh.  His PEG tube is intact and he denies any irritation at the site.  Wearing abdominal binder.  He has a whitish area in the back of his throat.   He is eating soft foods and has lost 10 lbs since 08/19/13.

## 2013-08-27 ENCOUNTER — Ambulatory Visit
Admission: RE | Admit: 2013-08-27 | Discharge: 2013-08-27 | Disposition: A | Discharge status: Home / Self Care | Payer: Medicaid Other | Source: Ambulatory Visit | Attending: Radiation Oncology | Admitting: Radiation Oncology

## 2013-08-27 ENCOUNTER — Inpatient Hospital Stay (HOSPITAL_COMMUNITY)
Admission: AD | Admit: 2013-08-27 | Discharge: 2013-08-30 | DRG: 147 | Disposition: A | Discharge status: Home Health | Payer: Medicaid Other | Source: Ambulatory Visit | Attending: Hematology and Oncology | Admitting: Hematology and Oncology

## 2013-08-27 ENCOUNTER — Ambulatory Visit: Payer: Medicaid Other | Admitting: Hematology and Oncology

## 2013-08-27 ENCOUNTER — Encounter (HOSPITAL_COMMUNITY): Payer: Self-pay

## 2013-08-27 ENCOUNTER — Encounter: Payer: Self-pay | Admitting: Hematology and Oncology

## 2013-08-27 VITALS — BP 130/88 | HR 95 | Temp 98.2°F | Resp 19 | Ht 79.0 in | Wt 306.8 lb

## 2013-08-27 DIAGNOSIS — R634 Abnormal weight loss: Secondary | ICD-10-CM | POA: Diagnosis present

## 2013-08-27 DIAGNOSIS — R112 Nausea with vomiting, unspecified: Secondary | ICD-10-CM

## 2013-08-27 DIAGNOSIS — I10 Essential (primary) hypertension: Secondary | ICD-10-CM | POA: Diagnosis present

## 2013-08-27 DIAGNOSIS — Z510 Encounter for antineoplastic radiation therapy: Secondary | ICD-10-CM | POA: Diagnosis present

## 2013-08-27 DIAGNOSIS — Z931 Gastrostomy status: Secondary | ICD-10-CM

## 2013-08-27 DIAGNOSIS — C099 Malignant neoplasm of tonsil, unspecified: Secondary | ICD-10-CM

## 2013-08-27 DIAGNOSIS — R059 Cough, unspecified: Secondary | ICD-10-CM | POA: Diagnosis present

## 2013-08-27 DIAGNOSIS — E43 Unspecified severe protein-calorie malnutrition: Secondary | ICD-10-CM | POA: Insufficient documentation

## 2013-08-27 DIAGNOSIS — Z87891 Personal history of nicotine dependence: Secondary | ICD-10-CM

## 2013-08-27 DIAGNOSIS — E46 Unspecified protein-calorie malnutrition: Secondary | ICD-10-CM | POA: Diagnosis present

## 2013-08-27 DIAGNOSIS — R05 Cough: Secondary | ICD-10-CM

## 2013-08-27 DIAGNOSIS — N179 Acute kidney failure, unspecified: Secondary | ICD-10-CM | POA: Diagnosis present

## 2013-08-27 DIAGNOSIS — Z7901 Long term (current) use of anticoagulants: Secondary | ICD-10-CM

## 2013-08-27 DIAGNOSIS — E860 Dehydration: Secondary | ICD-10-CM | POA: Diagnosis present

## 2013-08-27 DIAGNOSIS — Z6834 Body mass index (BMI) 34.0-34.9, adult: Secondary | ICD-10-CM | POA: Diagnosis not present

## 2013-08-27 DIAGNOSIS — R1115 Cyclical vomiting syndrome unrelated to migraine: Secondary | ICD-10-CM | POA: Diagnosis present

## 2013-08-27 DIAGNOSIS — B977 Papillomavirus as the cause of diseases classified elsewhere: Secondary | ICD-10-CM

## 2013-08-27 LAB — COMPREHENSIVE METABOLIC PANEL
ALK PHOS: 79 U/L (ref 39–117)
ALT: 68 U/L — ABNORMAL HIGH (ref 0–53)
AST: 34 U/L (ref 0–37)
Albumin: 3.8 g/dL (ref 3.5–5.2)
BILIRUBIN TOTAL: 0.3 mg/dL (ref 0.3–1.2)
BUN: 26 mg/dL — AB (ref 6–23)
CHLORIDE: 91 meq/L — AB (ref 96–112)
CO2: 30 meq/L (ref 19–32)
CREATININE: 1.56 mg/dL — AB (ref 0.50–1.35)
Calcium: 9.3 mg/dL (ref 8.4–10.5)
GFR, EST AFRICAN AMERICAN: 60 mL/min — AB (ref >90)
GFR, EST NON AFRICAN AMERICAN: 52 mL/min — AB (ref >90)
GLUCOSE: 107 mg/dL — AB (ref 70–99)
POTASSIUM: 4 meq/L (ref 3.7–5.3)
Sodium: 133 mEq/L — ABNORMAL LOW (ref 137–147)
Total Protein: 7.4 g/dL (ref 6.0–8.3)

## 2013-08-27 LAB — CBC
HEMATOCRIT: 40.3 % (ref 39.0–52.0)
HEMOGLOBIN: 13.7 g/dL (ref 13.0–17.0)
MCH: 29.3 pg (ref 26.0–34.0)
MCHC: 34 g/dL (ref 30.0–36.0)
MCV: 86.3 fL (ref 78.0–100.0)
Platelets: 232 10*3/uL (ref 150–400)
RBC: 4.67 MIL/uL (ref 4.22–5.81)
RDW: 12.2 % (ref 11.5–15.5)
WBC: 10.4 10*3/uL (ref 4.0–10.5)

## 2013-08-27 MED ORDER — ACETAMINOPHEN 325 MG PO TABS: 650.0000 mg | ORAL_TABLET | ORAL | Status: DC | PRN

## 2013-08-27 MED ORDER — ONDANSETRON HCL 4 MG/2ML IJ SOLN
4.0000 mg | Freq: Three times a day (TID) | INTRAMUSCULAR | Status: DC | PRN
  Administered 2013-08-27: 4 mg via INTRAVENOUS
  Filled 2013-08-27: qty 2

## 2013-08-27 MED ORDER — SUCRALFATE 1 G PO TABS
1.0000 g | ORAL_TABLET | Freq: Three times a day (TID) | ORAL | Status: DC
  Administered 2013-08-29 – 2013-08-30 (×5): 1 g via ORAL
  Filled 2013-08-27 (×16): qty 1

## 2013-08-27 MED ORDER — DOCUSATE SODIUM 100 MG PO CAPS
100.0000 mg | ORAL_CAPSULE | Freq: Two times a day (BID) | ORAL | Status: DC | PRN
  Filled 2013-08-27: qty 1

## 2013-08-27 MED ORDER — MAGIC MOUTHWASH W/LIDOCAINE
15.0000 mL | Freq: Four times a day (QID) | ORAL | Status: DC | PRN
  Filled 2013-08-27: qty 15

## 2013-08-27 MED ORDER — PROMETHAZINE HCL 25 MG/ML IJ SOLN
25.0000 mg | Freq: Once | INTRAMUSCULAR | Status: AC
Start: 2013-08-27 — End: 2013-08-27
  Administered 2013-08-27: 25 mg via INTRAVENOUS
  Filled 2013-08-27: qty 1

## 2013-08-27 MED ORDER — SODIUM FLUORIDE 1.1 % DT GEL: Freq: Every day | DENTAL | Status: DC

## 2013-08-27 MED ORDER — ENSURE COMPLETE PO LIQD
237.0000 mL | Freq: Three times a day (TID) | ORAL | Status: DC
  Administered 2013-08-28 – 2013-08-30 (×5): 237 mL via ORAL

## 2013-08-27 MED ORDER — ONDANSETRON 8 MG/NS 50 ML IVPB
8.0000 mg | Freq: Three times a day (TID) | INTRAVENOUS | Status: DC
  Administered 2013-08-27 – 2013-08-30 (×8): 8 mg via INTRAVENOUS
  Filled 2013-08-27 (×9): qty 8

## 2013-08-27 MED ORDER — ENOXAPARIN SODIUM 40 MG/0.4ML ~~LOC~~ SOLN
40.0000 mg | SUBCUTANEOUS | Status: DC
  Administered 2013-08-29: 40 mg via SUBCUTANEOUS
  Filled 2013-08-27 (×4): qty 0.4

## 2013-08-27 MED ORDER — OXYCODONE HCL 5 MG PO TABS
5.0000 mg | ORAL_TABLET | ORAL | Status: DC | PRN
  Administered 2013-08-29: 5 mg via ORAL
  Filled 2013-08-27: qty 1

## 2013-08-27 MED ORDER — HYDROMORPHONE HCL PF 1 MG/ML IJ SOLN
1.0000 mg | INTRAMUSCULAR | Status: DC | PRN
  Administered 2013-08-28 – 2013-08-29 (×4): 1 mg via INTRAVENOUS
  Filled 2013-08-27 (×4): qty 1

## 2013-08-27 MED ORDER — ONDANSETRON 8 MG PO TBDP: 4.0000 mg | ORAL_TABLET | Freq: Three times a day (TID) | ORAL | Status: DC | PRN

## 2013-08-27 MED ORDER — SENNOSIDES-DOCUSATE SODIUM 8.6-50 MG PO TABS: 1.0000 | ORAL_TABLET | Freq: Two times a day (BID) | ORAL | Status: DC | PRN

## 2013-08-27 MED ORDER — ONDANSETRON HCL 4 MG PO TABS: 4.0000 mg | ORAL_TABLET | Freq: Three times a day (TID) | ORAL | Status: DC | PRN

## 2013-08-27 MED ORDER — PROCHLORPERAZINE MALEATE 10 MG PO TABS
10.0000 mg | ORAL_TABLET | Freq: Four times a day (QID) | ORAL | Status: DC | PRN
  Filled 2013-08-27: qty 1

## 2013-08-27 MED ORDER — CHLORHEXIDINE GLUCONATE 0.12 % MT SOLN
10.0000 mL | Freq: Four times a day (QID) | OROMUCOSAL | Status: DC
  Administered 2013-08-27 – 2013-08-29 (×5): 10 mL via OROMUCOSAL
  Filled 2013-08-27 (×15): qty 15

## 2013-08-27 MED ORDER — LIDOCAINE-PRILOCAINE 2.5-2.5 % EX CREA: 1.0000 "application " | TOPICAL_CREAM | Freq: Once | CUTANEOUS | Status: DC

## 2013-08-27 MED ORDER — SODIUM CHLORIDE 0.9 % IV SOLN
INTRAVENOUS | Status: DC
  Administered 2013-08-27 – 2013-08-30 (×8): via INTRAVENOUS

## 2013-08-27 MED ORDER — PROMETHAZINE HCL 25 MG/ML IJ SOLN
25.0000 mg | Freq: Four times a day (QID) | INTRAMUSCULAR | Status: DC
  Administered 2013-08-27 – 2013-08-30 (×11): 25 mg via INTRAVENOUS
  Filled 2013-08-27 (×20): qty 1

## 2013-08-27 MED ORDER — ONDANSETRON HCL 8 MG PO TABS: 8.0000 mg | ORAL_TABLET | Freq: Three times a day (TID) | ORAL | Status: DC | PRN

## 2013-08-27 MED ORDER — ONDANSETRON 8 MG/NS 50 ML IVPB
8.0000 mg | Freq: Three times a day (TID) | INTRAVENOUS | Status: DC | PRN
  Filled 2013-08-27: qty 8

## 2013-08-27 NOTE — Progress Notes (Signed)
To provide support, encouragement and care continuity, met with patient and his fiance during under treat appt with Dr. Alvy Bimler.  Transported pt to [facility] for direct admission, transported him to 1321, provided report to receiving [employer].  Will continue to follow.  Continuing navigation as L1 patient (new patient).  Gayleen Orem, RN, BSN, [facility] Head & Neck Oncology Navigator [phone]

## 2013-08-27 NOTE — Progress Notes (Addendum)
INITIAL NUTRITION ASSESSMENT  Pt meets criteria for severe MALNUTRITION in the context of chronic illness as evidenced by <75% estimated energy intake with 6% weight loss in the past month.  DOCUMENTATION CODES Per approved criteria  -Severe malnutrition in the context of chronic illness -Obesity Unspecified   INTERVENTION: - Ensure Complete QID as pt reports he was holding these down well at home - Encouraged increased meal intake as nausea/vomiting resolves - RD to continue to monitor and assess need for nutrition support   NUTRITION DIAGNOSIS: Inadequate oral intake related to nausea/vomiting as evidenced by pt report.   Goal: 1. Resolution of nausea/vomiting 2. Pt to consume >90% of meals/supplements  Monitor:  Weights, labs, intake, nausea/vomiting   Reason for Assessment: Malnutrition screening tool, consult for assessment   48 y.o. 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 This patient has locally advanced tonsil cancer. Pt has been followed by [facility] RD. Met with pt last month during which time pt reported recent weight gain secondary to job change and decreased physical activity. Reported usual weight of 275 pounds but weighed 333.8 pounds 07/16/13, now down to 309 pounds.   -Pt started chemotherapy 08/19/13 and reports having nausea/vomiting since then -Reports if he tries to eat something he will vomit it up, sometimes this occurs immediately after eating, sometimes it will occur up to 2 hours after eating -For the past week he has been consuming 4 Ensure/day (some Ensure Plus, some regular Ensure) and 3 small containers of applesauce -Was drinking water at home but it wasn't enough to stay hydrated -Reports prior to chemotherapy he had great appetite and was eating 3 meals/day  -Had  some jello a few minutes prior to RD visit which pt threw up -Pt frustrated with ongoing nausea/vomiting -Had PEG placed 08/15/13 but only uses it for water flushes, no TF started  -Oncologist noted that if he continues to have intractable nausea and vomiting, he might need to be started on total parenteral nutrition   Nutrition Focused Physical Exam:  Subcutaneous Fat:  Orbital Region: WNL Upper Arm Region: WNL Thoracic and Lumbar Region: WNL  Muscle:  Temple Region: WNL Clavicle Bone Region: WNL Clavicle and Acromion Bone Region: WNL Scapular Bone Region: WNL Dorsal Hand: WNL Patellar Region: WNL Anterior Thigh Region: WNL Posterior Calf Region: WNL  Edema: none noted    Sodium slightly low BUN/Cr elevated with low GFR ALT elevated    Height: Ht Readings from Last 1 Encounters:  08/27/13 6' 7" (2.007 m)    Weight: Wt Readings from Last 1 Encounters:  08/27/13 309 lb 1.4 oz (140.2 kg)    Ideal Body Weight: 220 lbs   % Ideal Body Weight: 140%  Wt Readings from Last 10 Encounters:  08/27/13 309 lb 1.4 oz (140.2 kg)  08/27/13 306 lb 12.8 oz (139.164 kg)  08/26/13 308 lb (139.708 kg)  08/19/13 318 lb 9.6 oz (144.516 kg)  08/15/13 328 lb (148.78 kg)  08/15/13 328 lb (148.78 kg)  08/12/13 328 lb 9.6 oz (149.052 kg)  08/09/13 329 lb (149.233 kg)  08/02/13 329 lb 5.9 oz (149.4 kg)  08/02/13 329 lb 5.9 oz (149.4 kg)    Usual Body Weight: 275 lbs per pt  % Usual Body Weight: 112%  BMI:  Body mass index is 34.81 kg/(m^2). Class I obesity  Estimated Nutritional Needs: Kcal:  2450-2650 Protein: 150-180g Fluid: 2.4-2.6L/day   Skin: Intact  Diet Order: Full Liquid  EDUCATION NEEDS: -No education needs identified at this time   Intake/Output Summary (Last 24 hours) at 08/27/13 1638 Last data filed at 08/27/13 1407  Gross per 24 hour  Intake    360 ml  Output      0 ml  Net    360 ml    Last BM: PTA  Labs:   Recent Labs Lab 08/27/13 1227  NA  133*  K 4.0  CL 91*  CO2 30  BUN 26*  CREATININE 1.56*  CALCIUM 9.3  GLUCOSE 107*    CBG (last 3)  No results found for this basename: GLUCAP,  in the last 72 hours  Scheduled Meds: . chlorhexidine  10 mL Mouth/Throat QID  . enoxaparin (LOVENOX) injection  40 mg Subcutaneous Q24H  . lidocaine-prilocaine  1 application Topical Once  . ondansetron (ZOFRAN) IV  8 mg Intravenous 3 times per day  . promethazine  25 mg Intravenous 4 times per day  . sodium fluoride   dental QHS  . sucralfate  1 g Oral TID WC & HS    Continuous Infusions: . sodium chloride 150 mL/hr at 08/27/13 1219    Past Medical History  Diagnosis Date  . 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 Knee pain, chronic   . Anxiety     mild new dx  . Arthritis     knees,hips  . Concussion     Hx: in high school x 2  . Complication of anesthesia     Pt stated " my oxygen level was slow in rising."  . Hypertension   . PEG (percutaneous endoscopic gastrostomy) status     Past Surgical History  Procedure Laterality Date  . Multiple extractions with alveoloplasty N/A 08/01/2013    Procedure: Extraction of tooth #'s 1,15,17,31, 32 with alveoloplasty, mandibular left torus reduction, and gross debridement of remaining teeth.;  Surgeon: Derak F Kulinski, DDS;  Location: [facility] OR;  Service: Oral Surgery;  Laterality: N/A;  . Laparoscopic gastrostomy N/A 08/15/2013    Procedure: LAPAROSCOPIC GASTROSTOMY TUBE PLACEMENT;  Surgeon: Armando Ramirez, MD;  Location: [facility] OR;  Service: General;  Laterality: N/A;  . Portacath placement Left 08/15/2013    Procedure: INSERTION PORT-A-CATH;  Surgeon: Armando Ramirez, MD;  Location: [facility] OR;  Service: General;  Laterality: Left;    Heather Baron MS, RD, LDN [phone] Pager [phone] After Hours Pager  

## 2013-08-27 NOTE — Progress Notes (Signed)
Pt was lying in bed when I arrived. When asked how he was feeling, he said tired. I offered to visit another time and he said that would be great. Pt was encouraged to request Chaplain visit if he needs one before I return on Thurs.  Ernest Haber Chaplain  08/27/13 1300  Clinical Encounter Type  Visited With Patient

## 2013-08-27 NOTE — H&P (Signed)
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 male is here because of uncontrolled nausea, vomiting with dehydration. This patient has locally advanced tonsil cancer. 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 FNA of right level III lymph node was inconclusive for cancer   07/25/2013 Imaging PET scan showed locally advanced disease with abnormal lymphadenopathy in the right axilla   08/06/2013 Procedure CT-guided biopsy of the lymphadenopathy was negative for malignancy   08/15/2013 Surgery Patient has placement of port and feeding tube   08/19/2013 -  Chemotherapy Patient received chemotherapy with cisplatin   08/19/2013 -  Radiation Therapy Patient received radiation treatment   Last week, the patient was started on concurrent chemoradiation therapy. On the same evening of his treatment, he developed uncontrolled nausea and vomiting. The patient has lost about 15 pounds of weight over the past week. All his antiemetics are not working. He cannot even tolerate water, pills or medications to control his symptoms. Within a few  minutes of swallowing anything, he would throat everything back up. The patient is extremely weak. He denies any stomach discomfort. No change in bowel habits. He denies any headaches or new neurological deficit.  MEDICAL HISTORY:  Past Medical History  Diagnosis Date  . 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 Knee pain, chronic   . Anxiety     mild new dx  . Arthritis     knees,hips  . Concussion     Hx: in high school x 2  . Complication of anesthesia     Pt stated " my oxygen level was slow in rising."  . Hypertension   . PEG (percutaneous endoscopic gastrostomy) status     SURGICAL HISTORY: Past Surgical History  Procedure Laterality Date  . Multiple extractions with alveoloplasty N/A 08/01/2013    Procedure: Extraction of tooth #'s 1,15,17,31, 32 with alveoloplasty, mandibular left torus reduction, and gross debridement of remaining teeth.;  Surgeon: [name], DDS;  Location: [facility];  Service: Oral Surgery;  Laterality: N/A;  . Laparoscopic gastrostomy N/A 08/15/2013    Procedure: LAPAROSCOPIC GASTROSTOMY TUBE PLACEMENT;  Surgeon: [name], MD;  Location: [facility];  Service: General;  Laterality: N/A;  . Portacath placement Left 08/15/2013    Procedure: INSERTION PORT-A-CATH;  Surgeon: [name], MD;  Location: [facility];  Service: General;  Laterality: Left;    SOCIAL HISTORY: History   Social History  . Marital Status: Single    Spouse Name: N/A    Number of Children: 1  . Years of Education: N/A   Occupational History  . Not on file.   Social History Main Topics  .  Smoking status: Former Smoker -- 1.00 packs/day for 20 years    Types: Cigarettes    Quit date: 07/16/2003  . Smokeless tobacco: Never Used  . Alcohol Use: 7.2 oz/week    12 Cans of beer per week     Comment: social [place], beer  "2 weekends out of the month"  . Drug Use: Yes    Special: Marijuana  . Sexual Activity: Not on file   Other Topics Concern  . Not on  file   Social History Narrative  . No narrative on file    FAMILY HISTORY: Family History  Problem Relation Age of Onset  . CVA    . Diabetes    . Heart attack    . Arthritis Father   . Arthritis Mother     ALLERGIES:  is allergic to pollen extract.  MEDICATIONS:  Current Facility-Administered Medications  Medication Dose Route Frequency Provider Last Rate Last Dose  . 0.9 %  sodium chloride infusion   Intravenous Continuous [name], MD 150 mL/hr at 08/27/13 1219    . acetaminophen (TYLENOL) tablet 650 mg  650 mg Oral Q4H PRN [name], MD      . chlorhexidine (PERIDEX) 0.12 % solution 10 mL  10 mL Mouth/Throat QID [name], MD      . docusate sodium (COLACE) capsule 100 mg  100 mg Oral BID PRN [name], MD      . enoxaparin (LOVENOX) injection 40 mg  40 mg Subcutaneous Q24H Becca Bayne, MD      . HYDROmorphone (DILAUDID) injection 1 mg  1 mg Intravenous Q2H PRN [name], MD      . lidocaine-prilocaine (EMLA) cream 1 application  1 application Topical Once [name], MD      . magic mouthwash w/lidocaine  15 mL Oral QID PRN [name], MD      . ondansetron (ZOFRAN) tablet 4-8 mg  4-8 mg Oral Q8H PRN [name], MD       Or  . ondansetron (ZOFRAN-ODT) disintegrating tablet 4-8 mg  4-8 mg Oral Q8H PRN [name], MD       Or  . ondansetron (ZOFRAN) injection 4 mg  4 mg Intravenous Q8H PRN [name], MD   4 mg at 08/27/13 1220   Or  . ondansetron (ZOFRAN) 8 mg/NS 50 ml IVPB  8 mg Intravenous Q8H PRN Sweetie Giebler, MD      . oxyCODONE (Oxy IR/ROXICODONE) immediate release tablet 5 mg  5 mg Oral Q4H PRN [name], MD      . prochlorperazine (COMPAZINE) tablet 10 mg  10 mg Oral Q6H PRN [name], MD      . senna-docusate (Senokot-S) tablet 1 tablet  1 tablet Oral BID PRN [name], MD      . sodium fluoride (FLUORISHIELD) 1.1 % dental gel   dental QHS Shylo Dillenbeck, MD      . sucralfate (CARAFATE) tablet 1 g  1 g Oral TID WC & HS Alfonso Carden, MD        REVIEW OF SYSTEMS:    Constitutional: Denies fevers, chills or abnormal night sweats Eyes: Denies blurriness of vision, double vision or watery eyes Ears, nose, mouth, throat, and face: Denies mucositis or sore throat Respiratory: Denies cough, dyspnea or wheezes Cardiovascular: Denies palpitation, chest discomfort or lower extremity swelling Skin: Denies abnormal skin rashes Lymphatics: Denies new lymphadenopathy or easy bruising Behavioral/Psych: Mood is stable, no new changes  All other systems were reviewed with  the patient and are negative.  PHYSICAL EXAMINATION: ECOG PERFORMANCE STATUS: 2 - Symptomatic, <50% confined to bed  Filed Vitals:   08/27/13 1100  BP: 128/83  Pulse: 83  Temp: 98.3 F (36.8 C)  Resp: 20   Filed Weights   08/27/13 1100  Weight: 309 lb 1.4 oz (140.2 kg)    GENERAL:alert, in severe distress and appears uncomfortable. The patient is obese. He has recurrent dry heaves and vomiting. SKIN: skin color, texture, turgor are normal, no rashes or significant lesions EYES: normal, conjunctiva are pink and non-injected, sclera clear OROPHARYNX: Large oral mass. No mucositis or thrush . Dry mucous membrane is noted NECK: supple, thyroid normal size, non-tender, without nodularity LYMPH:  Palpable lymphadenopathy in both sides of the neck, unchanged compared to previous exam LUNGS: clear to auscultation and percussion with normal breathing effort HEART: tachycardia with no murmurs and no lower extremity edema ABDOMEN:abdomen soft, non-tender and normal bowel sounds. Feeding tube site looks okay Musculoskeletal:no cyanosis of digits and no clubbing  PSYCH: alert & oriented x 3 with fluent speech NEURO: no focal motor/sensory deficits  LABORATORY DATA:  I have reviewed the data as listed Lab Results  Component Value Date   WBC 10.4 08/27/2013   HGB 13.7 08/27/2013   HCT 40.3 08/27/2013   MCV 86.3 08/27/2013   PLT 232 08/27/2013    Recent Labs  07/31/13 1140 08/01/13 1410  08/02/13 0340 08/19/13 0831 08/27/13 1227  NA 143 136* 137 139 133*  K 4.6 5.3 3.8 4.5 4.0  CL 107 101 99  --  91*  CO2 23 20 24 27 30   GLUCOSE 93 129* 101* 101 107*  BUN 16 14 12  12.2 26*  CREATININE 1.49* 1.14 1.12 1.3 1.56*  CALCIUM 9.4 9.2 9.1 10.1 9.3  GFRNONAA 55* 76* 78*  --  52*  GFRAA 64* 88* >90  --  60*  PROT 7.4  --   --  7.8 7.4  ALBUMIN 4.0  --   --  3.9 3.8  AST 41*  --   --  40* 34  ALT 55*  --   --  50 68*  ALKPHOS 59  --   --  67 79  BILITOT 0.3  --   --  0.60 0.3   ASSESSMENT & PLAN:  #1 locally advanced tonsil cancer The patient is not able to tolerate outpatient management. His next chemotherapy is not due to 2 weeks from now. He can continue radiation therapy while hospitalized. I recommend admission to the hospital for intractable nausea, vomiting and dehydration. #2 uncontrolled nausea and vomiting He has the most severe case of nausea & vomiting that I have ever seen in my entire career. I recommend continuous IV fluids and IV anti-emetics as needed. Once he feels better, I will order imaging study of his head to rule out intracranial metastasis. If that is normal, I will proceed to order imaging study of his abdomen to rule out physical cause of his uncontrolled nausea and vomiting. #3 severe dehydration with acute renal failure Repeat blood work today showed acute renal failure. I will recommend continuous IV fluid resuscitation #4 weight loss with malnutrition I will consult nutrition service. I will start him on liquid diet for now. If he continues to have for intractable nausea and vomiting, he might need to be started on total parenteral nutrition. #5 DVT prophylaxis Will start him on Lovenox #7 CODE STATUS Full code  All questions were answered. The patient knows to call  the clinic with any problems, questions or concerns.   [name], MD 08/27/2013 1:39 PM

## 2013-08-27 NOTE — Progress Notes (Signed)
Please see admission H&P

## 2013-08-28 ENCOUNTER — Inpatient Hospital Stay (HOSPITAL_COMMUNITY): Payer: Medicaid Other

## 2013-08-28 ENCOUNTER — Ambulatory Visit: Payer: Medicaid Other

## 2013-08-28 DIAGNOSIS — R110 Nausea: Secondary | ICD-10-CM

## 2013-08-28 DIAGNOSIS — R059 Cough, unspecified: Secondary | ICD-10-CM

## 2013-08-28 DIAGNOSIS — R634 Abnormal weight loss: Secondary | ICD-10-CM

## 2013-08-28 DIAGNOSIS — R05 Cough: Secondary | ICD-10-CM

## 2013-08-28 DIAGNOSIS — J3489 Other specified disorders of nose and nasal sinuses: Secondary | ICD-10-CM

## 2013-08-28 DIAGNOSIS — E46 Unspecified protein-calorie malnutrition: Secondary | ICD-10-CM

## 2013-08-28 LAB — BASIC METABOLIC PANEL
BUN: 22 mg/dL (ref 6–23)
CHLORIDE: 95 meq/L — AB (ref 96–112)
CO2: 27 meq/L (ref 19–32)
CREATININE: 1.42 mg/dL — AB (ref 0.50–1.35)
Calcium: 9 mg/dL (ref 8.4–10.5)
GFR calc non Af Amer: 58 mL/min — ABNORMAL LOW (ref >90)
GFR, EST AFRICAN AMERICAN: 67 mL/min — AB (ref >90)
Glucose, Bld: 107 mg/dL — ABNORMAL HIGH (ref 70–99)
Potassium: 4.1 mEq/L (ref 3.7–5.3)
Sodium: 133 mEq/L — ABNORMAL LOW (ref 137–147)

## 2013-08-28 MED ORDER — GUAIFENESIN ER 600 MG PO TB12: 600.0000 mg | ORAL_TABLET | Freq: Two times a day (BID) | ORAL | Status: DC

## 2013-08-28 MED ORDER — GUAIFENESIN ER 600 MG PO TB12
600.0000 mg | ORAL_TABLET | Freq: Two times a day (BID) | ORAL | Status: AC
  Administered 2013-08-28: 600 mg via ORAL
  Filled 2013-08-28 (×2): qty 1

## 2013-08-28 MED ORDER — GUAIFENESIN 100 MG/5ML PO SOLN: 5.0000 mL | ORAL | Status: DC | PRN

## 2013-08-28 NOTE — Progress Notes (Signed)
Patient hospitalized with nausea, vomiting and dehydration. Alvy Bimler noted reads the patient may receive radiation treatment while hospitalized. Phoned floor and spoke with Marita [place], Therapist, sports. She reports episodes of vomiting are less. She reports the staff is alternating zofran and phenergan to control his nausea/vomiting. She questioned patient if he would like to come down for radiation therapy. Patient states, "Dr. Alvy Bimler told me to hold off on everything until they could figure out why I was vomiting so much." Treatment held today. Will check patient status tomorrow. Informed Anna, RT on L4 of this finding and Dr. Isidore Moos.

## 2013-08-28 NOTE — Plan of Care (Signed)
Problem: Consults Goal: Nutrition Consult-if indicated Outcome: Completed/Met Date Met:  08/28/13 5/12 with Dietician     

## 2013-08-28 NOTE — Progress Notes (Signed)
Mason [name]   DOB:1965/05/06   YC#:[id number]   JSH#:[id number] I have seen the patient, examined him and edited the notes as follows  Subjective:   Afebrile. He had dry heaves but no vomiting this morning. He states that his symptoms are improved "6/10".  He has decreased appetite. Last bowel movement today.  He also reports"sinus" congestion and dry cough. No other issues reported. He felt that overall his problems is related to his feeding tube and gagging due to thick mucus production in his throat.    Objective:  Filed Vitals:   08/28/13 0655  BP: 146/99  Pulse: 82  Temp: 98.5 F (36.9 C)  Resp: 20    Body mass index is 34.81 kg/(m^2).  Intake/Output Summary (Last 24 hours) at 08/28/13 0707 Last data filed at 08/28/13 0600  Gross per 24 hour  Intake 3352.5 ml  Output      0 ml  Net 3352.5 ml      GENERAL:alert, conversant, continues to experience has recurrent dry heaves. SKIN: skin color, texture, turgor are normal, no rashes or significant lesions  EYES: normal, conjunctiva are pink and non-injected, sclera clear  OROPHARYNX: known large oral mass. No mucositis or thrush . Dry mucous membrane is noted  NECK: supple, thyroid normal size, non-tender, without nodularity  LYMPH: Palpable lymphadenopathy in both sides of the neck, non- tender to palpation LUNGS: clear to auscultation and percussion with normal breathing effort  HEART: tachycardia with no murmurs and no lower extremity edema  ABDOMEN:obese, soft, non-tender and normal bowel sounds. Feeding tube site without erythema or exudates. He has discomfort on palpation around his feeding tube site. Musculoskeletal:no cyanosis of digits and no clubbing  PSYCH: alert & oriented x 3 with fluent speech  NEURO: no focal motor/sensory deficits     Labs:   Recent Labs Lab 08/27/13 1227  WBC 10.4  HGB 13.7  HCT 40.3  PLT 232  MCV 86.3  MCH 29.3  MCHC 34.0  RDW 12.2     Chemistries:    Recent Labs Lab  08/27/13 1227 08/28/13 0515  NA 133* 133*  K 4.0 4.1  CL 91* 95*  CO2 30 27  GLUCOSE 107* 107*  BUN 26* 22  CREATININE 1.56* 1.42*  CALCIUM 9.3 9.0  AST 34  --   ALT 68*  --   ALKPHOS 79  --   BILITOT 0.3  --      Liver Function Tests:  Recent Labs Lab 08/27/13 1227  AST 34  ALT 68*  ALKPHOS 79  BILITOT 0.3  PROT 7.4  ALBUMIN 3.8   Assessment/Plan:  #1 Locally advanced tonsil cancer  The patient is not able to tolerate outpatient management.  His next chemotherapy is due on 09/09/13. He can continue radiation therapy while hospitalized. The patient wants to hold off radiation today due to severe symptoms of nausea.   #2 Uncontrolled nausea and vomiting  Symptoms were severe on presentation, now improving with no vomiting this morning.  He is receiving IV fluids and scheduled round the clock IV anti-emetics.  His mucus production contributed to some gagging. I will start him on Mucinex. Once he feels better, imaging study of his head will be entertained to rule out intracranial metastasis.  I believe the presence of the feeding tube could be an other causes. I will consult Gen. surgery. I have ordered abdominal x-ray for further evaluation.   #3 Severe dehydration with acute renal failure  Secondary to vomiting. Improving with continuous  IV fluid resuscitation. Continue present therapy. I will recheck his kidney function test tomorrow.  #4 Weight loss with malnutrition  Nutrition service consulted patient on 5/12.  On liquid diet for now.  If he continues to have for intractable nausea and vomiting, he might need to be started on total parenteral nutrition.   #5 DVT prophylaxis  On Lovenox   #6 Cough/Congestion Start guaifenesin bid to help with his congestion and mucous buildup which may exacerbate his nausea and vomiting. Patient is afebrile and has no other symptoms to suggest infection. Will monitor.    #7 CODE STATUS  Full code    **Disclaimer: This  note was dictated with voice recognition software. Similar sounding words can inadvertently be transcribed and this note may contain transcription errors which may not have been corrected upon publication of note.Rondel Jumbo, PA-C 08/28/2013  7:07 AM  [name], MD 08/28/2013

## 2013-08-28 NOTE — Progress Notes (Signed)
MD, Patient reported this am that he is having severe congestion and dry cough, which has been progressively getting worse over the past few days. Patient thinks this may be contributing to N/V.  Could he please have something for this? Thanks, Leora Platt [place]

## 2013-08-28 NOTE — Care Management Note (Signed)
CARE MANAGEMENT NOTE 08/28/2013  Patient:  Mason [name], Mason [name]   Account Number:  [id number]  Date Initiated:  08/28/2013  Documentation initiated by:  Shresta Risden  Subjective/Objective Assessment:   48 yo male admitted with dehydration. No PCP on record.     Action/Plan:   Home when stable   Anticipated DC Date:     Anticipated DC Plan:  [place]  CM consult      Choice offered to / List presented to:  NA   DME arranged  NA      DME agency  NA     [facility] arranged  NA      [place] agency  NA   Status of service:  In process, will continue to follow Medicare Important Message given?   (If response is "NO", the following Medicare IM given date fields will be blank) Date Medicare IM given:   Date Additional Medicare IM given:    Discharge Disposition:    Per UR Regulation:  Reviewed for med. necessity/level of care/duration of stay  If discussed at [place] of Stay Meetings, dates discussed:    Comments:  08/28/13 1104 Maddex Garlitz,MSN,RN [phone] Chart reviewed for utilization of services. No PCP on record. CM to provide pt with information concerning [facility] and orange card insurance. Financial Counselor to consult. Pt followed by Dr. Alvy [facility]imler at [facility].

## 2013-08-29 ENCOUNTER — Encounter: Payer: Self-pay | Admitting: *Deleted

## 2013-08-29 ENCOUNTER — Ambulatory Visit
Admission: RE | Admit: 2013-08-29 | Discharge: 2013-08-29 | Disposition: A | Discharge status: Home / Self Care | Payer: Medicaid Other | Source: Ambulatory Visit | Attending: Radiation Oncology | Admitting: Radiation Oncology

## 2013-08-29 ENCOUNTER — Ambulatory Visit: Payer: Self-pay | Admitting: Hematology and Oncology

## 2013-08-29 ENCOUNTER — Encounter: Payer: Self-pay | Admitting: Hematology and Oncology

## 2013-08-29 DIAGNOSIS — R112 Nausea with vomiting, unspecified: Secondary | ICD-10-CM

## 2013-08-29 DIAGNOSIS — N179 Acute kidney failure, unspecified: Secondary | ICD-10-CM

## 2013-08-29 DIAGNOSIS — E860 Dehydration: Secondary | ICD-10-CM

## 2013-08-29 LAB — COMPREHENSIVE METABOLIC PANEL
ALK PHOS: 76 U/L (ref 39–117)
ALT: 52 U/L (ref 0–53)
AST: 28 U/L (ref 0–37)
Albumin: 3.1 g/dL — ABNORMAL LOW (ref 3.5–5.2)
BILIRUBIN TOTAL: 0.4 mg/dL (ref 0.3–1.2)
BUN: 18 mg/dL (ref 6–23)
CHLORIDE: 98 meq/L (ref 96–112)
CO2: 25 mEq/L (ref 19–32)
Calcium: 8.7 mg/dL (ref 8.4–10.5)
Creatinine, Ser: 1.25 mg/dL (ref 0.50–1.35)
GFR calc Af Amer: 78 mL/min — ABNORMAL LOW (ref >90)
GFR calc non Af Amer: 68 mL/min — ABNORMAL LOW (ref >90)
Glucose, Bld: 92 mg/dL (ref 70–99)
POTASSIUM: 4.3 meq/L (ref 3.7–5.3)
Sodium: 134 mEq/L — ABNORMAL LOW (ref 137–147)
TOTAL PROTEIN: 6.5 g/dL (ref 6.0–8.3)

## 2013-08-29 LAB — URINE CULTURE
COLONY COUNT: NO GROWTH
Culture: NO GROWTH

## 2013-08-29 MED ORDER — GUAIFENESIN ER 600 MG PO TB12
600.0000 mg | ORAL_TABLET | Freq: Two times a day (BID) | ORAL | Status: DC
Start: 2013-08-29 — End: 2013-08-30
  Administered 2013-08-29 (×2): 600 mg via ORAL
  Filled 2013-08-29 (×4): qty 1

## 2013-08-29 NOTE — Progress Notes (Signed)
Subjective: Pt has had ongoing nausea and vomiting since she started chemotherapy on Monday 08/19/13.  He was readmitted for nausea, vomiting, and dehydration.  We are ask to see and see if the gastrostomy tube placed on 08/15/13, is an issue causing his nausea and vomiting.  He does not remember irrigation instructions and says they showed him to irrigate it in the cancer center,but were using the balloon port.  This is all report from the patient. Currently he's not  he is not tender, or distended.  He still has some mild nausea, but markedly improved.   Objective: Vital signs in last 24 hours: Temp:  [98.4 F (36.9 C)-98.9 F (37.2 C)] 98.9 F (37.2 C) (05/14 1342) Pulse Rate:  [81-96] 96 (05/14 1342) Resp:  [16-18] 18 (05/14 1342) BP: (134-154)/(92-101) 154/92 mmHg (05/14 1346) SpO2:  [97 %-100 %] 100 % (05/14 1342) Last BM Date: 08/28/13  Intake/Output from previous day: 05/13 0701 - 05/14 0700 In: 3800 [I.V.:3550; IV Piggyback:150] Out: -  Intake/Output this shift: Total I/O In: 840 [P.O.:840] Out: -   General appearance: alert, cooperative and no distress Resp: clear to auscultation bilaterally Cardio: regular rate and rhythm, S1, S2 normal, no murmur, click, rub or gallop GI: soft, non-tender; bowel sounds normal; no masses,  no organomegaly and gastrostomy tube in place.  The site is clean, the tube flushes without any difficulty.  Lab Results:   Recent Labs  08/27/13 1227  WBC 10.4  HGB 13.7  HCT 40.3  PLT 232    BMET  Recent Labs  08/28/13 0515 08/29/13 0545  NA 133* 134*  K 4.1 4.3  CL 95* 98  CO2 27 25  GLUCOSE 107* 92  BUN 22 18  CREATININE 1.42* 1.25  CALCIUM 9.0 8.7   PT/INR No results found for this basename: LABPROT, INR,  in the last 72 hours   Recent Labs Lab 08/27/13 1227 08/29/13 0545  AST 34 28  ALT 68* 52  ALKPHOS 79 76  BILITOT 0.3 0.4  PROT 7.4 6.5  ALBUMIN 3.8 3.1*     Lipase  No results found for this basename:  lipase     Studies/Results: Dg Abd 1 View  08/28/2013   CLINICAL DATA:  Abdominal pain and nausea  EXAM: ABDOMEN - 1 VIEW  COMPARISON:  None.  FINDINGS: There is a gastrostomy type catheter in the left abdomen. Bowel gas pattern is unremarkable. No obstruction or free air is seen on this supine examination. There are small phleboliths in the pelvis.  IMPRESSION: Bowel gas pattern unremarkable.   Electronically Signed   By: Lowella Grip M.D.   On: 08/28/2013 13:00    Medications: . chlorhexidine  10 mL Mouth/Throat QID  . enoxaparin (LOVENOX) injection  40 mg Subcutaneous Q24H  . feeding supplement (ENSURE COMPLETE)  237 mL Oral TID WC & HS  . guaiFENesin  600 mg Oral BID  . lidocaine-prilocaine  1 application Topical Once  . ondansetron (ZOFRAN) IV  8 mg Intravenous 3 times per day  . promethazine  25 mg Intravenous 4 times per day  . sodium fluoride   dental QHS  . sucralfate  1 g Oral TID WC & HS    Assessment/Plan 1. Locally advanced tonsil cancer  2.  Uncontrolled nausea and vomiting after chemotherapy, ongoing since 08/19/13. 3.  Dehydration and ARF 4.  S/p Portacath and gastrostomy tube placement on 08/15/13.  Plan:  I currently do not see anything that would indicate this nausea and  vomiting is due to mechanical obstruction from the gastrostomy tube.  We will get an UGI and see if there is a mechanical issue with the tube.  We will follow with you.     LOS: 2 days    Earnstine Regal 08/29/2013

## 2013-08-29 NOTE — Progress Notes (Signed)
[facility]  Patient Status: Active (receiving services up to time of hospitalization)  [facility] is providing the following services: RN and MSW  If patient discharges after hours, please call [phone].   Mason [name] 08/29/2013, 8:45 PM

## 2013-08-29 NOTE — Progress Notes (Signed)
During assessment of patient's PEG tube, I noticed it was very clogged w/ mucous.  Black drainage was coming from around PEG insertion site.  Patient had been flushing through the "Bal" port of the PEG instead of the "feeding port".  Patient's tube was cleaned, flushed,and dressing changed.  May need to check how much air is in the tube now that patient was flushing the wrong port.  He reported that this is how he was educated to flush the tube after surgery.   MD, Could you please place care orders for PEG tube.  Also, patient reports mucinex has helped.  Mucinex order has expired, can we continue this per patient request.Mason [name] [place]

## 2013-08-29 NOTE — Progress Notes (Signed)
Visited pt on WL 1321 to provide support and encouragement.  Patient reported that nausea has been under good control the last couple of days and that he has been able to ingest liquids without accompanying N&V.  He stated he expects to be discharged tomorrow after his RT.  Continuing navigation as L1 patient (new patient).  Gayleen Orem, RN, BSN, [facility] Navigator [phone]

## 2013-08-29 NOTE — Progress Notes (Signed)
Cisplatin is not a replaceable drug °

## 2013-08-29 NOTE — Care Management Note (Signed)
Cm spoke with patient at the bedside concerning PCP follow up. Per pt currently followed at [facility]. Pt has follow up appt scheduled. Pt may benefit for [facility] at discharge to assist with PEG.    Venita Lick Esias Mory,MSN,RN [phone]

## 2013-08-29 NOTE — Progress Notes (Signed)
Mason [name]   DOB:03-Jun-1965   WG#:[id number]   VHQ#:[id number] I have seen the patient, examined him and edited the notes as follows  Subjective: Afebrile. His dry heaves are less frequent, last one at 3 a.m; no vomiting for the last 24 hrs. His pain is controlled with medications. He has decreased appetite but trying to push some food. Last bowel movement today. His cough and mucous congestion is reduced. Per nursing note, patient has been feeding from the wrong port of the PEG tube requiring cleaning and re-explanation on how to utilize it. PEG tube region still uncomfortable.  Objective:  Filed Vitals:   08/29/13 0415  BP: 151/92  Pulse: 81  Temp: 98.9 F (37.2 C)  Resp: 18    Body mass index is 34.81 kg/(m^2).  Intake/Output Summary (Last 24 hours) at 08/29/13 0741 Last data filed at 08/29/13 0540  Gross per 24 hour  Intake   3800 ml  Output      0 ml  Net   3800 ml    GENERAL: alert, conversant, continues to experience has recurrent intermittent dry heaves. SKIN: skin color, texture, turgor are normal, no rashes or significant lesions  EYES: normal, conjunctiva are pink and non-injected, sclera clear  OROPHARYNX: known large oral mass. No mucositis or thrush . Dry mucous membrane is noted  NECK: supple, thyroid normal size, non-tender, without nodularity  LYMPH: Palpable lymphadenopathy in both sides of the neck, non- tender to palpation LUNGS: clear to auscultation and percussion with normal breathing effort  HEART: tachycardia with no murmurs and no lower extremity edema  ABDOMEN:obese, soft, non-tender and normal bowel sounds. Feeding tube site without erythema or exudates. He has discomfort on palpation around his feeding tube site. Musculoskeletal:no cyanosis of digits and no clubbing  PSYCH: alert & oriented x 3 with fluent speech  NEURO: no focal motor/sensory deficits     Labs:   Recent Labs Lab 08/27/13 1227  WBC 10.4  HGB 13.7  HCT 40.3  PLT 232  MCV  86.3  MCH 29.3  MCHC 34.0  RDW 12.2     Chemistries:    Recent Labs Lab 08/27/13 1227 08/28/13 0515 08/29/13 0545  NA 133* 133* 134*  K 4.0 4.1 4.3  CL 91* 95* 98  CO2 30 27 25   GLUCOSE 107* 107* 92  BUN 26* 22 18  CREATININE 1.56* 1.42* 1.25  CALCIUM 9.3 9.0 8.7  AST 34  --  28  ALT 68*  --  52  ALKPHOS 79  --  76  BILITOT 0.3  --  0.4     Liver Function Tests:  Recent Labs Lab 08/27/13 1227 08/29/13 0545  AST 34 28  ALT 68* 52  ALKPHOS 79 76  BILITOT 0.3 0.4  PROT 7.4 6.5  ALBUMIN 3.8 3.1*   I have reviewed the x-rays myself. CLINICAL DATA:   Abdominal pain and nausea  EXAM:  ABDOMEN - 1 VIEW  COMPARISON: None.  FINDINGS:  There is a gastrostomy type catheter in the left abdomen. Bowel gas pattern is unremarkable. No obstruction or free air is seen on this supine examination. There are small phleboliths in the pelvis.  IMPRESSION:  Bowel gas pattern unremarkable.    Assessment/Plan:  #1 Locally advanced tonsil cancer  The patient is not able to tolerate outpatient management.  His next chemotherapy is due on 09/09/13. The patient is ready to resume radiation treatment today.  #2 Uncontrolled nausea and vomiting  Symptoms were severe on presentation, now  improving with no vomiting this morning.  He is receiving IV fluids and scheduled round the clock IV anti-emetics.  His mucus production contributed to some gagging, ameliorated with Mucinex.   The presence of the feeding tube could be an other cause.General Surgery has been consulted and will see patient today. Abdominal x-ray on 5/13 unremarkable and G-tube in place.   #3 Severe dehydration with acute renal failure, resolving Secondary to vomiting. Improving with continuous IV fluid resuscitation. I will reduce his IV fluid to 75 cc per hour. Acute renal failure resolved.  #4 Weight loss with malnutrition  Nutrition service consulted patient on 5/12.  On liquid diet for now.  If he  continues to have for intractable nausea and vomiting, he might need to be started on total parenteral nutrition.   #5 DVT prophylaxis  On Lovenox   #6 Cough/Congestion Continue guaifenesin bid to help with his congestion and mucous buildup which may exacerbate his nausea and vomiting. Patient is afebrile and has no other symptoms to suggest infection. Will monitor.    #7 CODE STATUS  Full code    **Disclaimer: This note was dictated with voice recognition software. Similar sounding words can inadvertently be transcribed and this note may contain transcription errors which may not have been corrected upon publication of note.Rondel Jumbo, PA-C 08/29/2013  7:41 AM  [name], MD 08/29/2013

## 2013-08-30 ENCOUNTER — Telehealth: Payer: Self-pay | Admitting: *Deleted

## 2013-08-30 ENCOUNTER — Ambulatory Visit
Admission: RE | Admit: 2013-08-30 | Discharge: 2013-08-30 | Disposition: A | Discharge status: Home / Self Care | Payer: Medicaid Other | Source: Ambulatory Visit | Attending: Radiation Oncology | Admitting: Radiation Oncology

## 2013-08-30 ENCOUNTER — Other Ambulatory Visit: Payer: Self-pay | Admitting: Hematology and Oncology

## 2013-08-30 ENCOUNTER — Inpatient Hospital Stay (HOSPITAL_COMMUNITY): Payer: Medicaid Other

## 2013-08-30 ENCOUNTER — Telehealth: Payer: Self-pay | Admitting: Hematology and Oncology

## 2013-08-30 MED ORDER — PROMETHAZINE HCL 25 MG/ML IJ SOLN: 25.0000 mg | Freq: Four times a day (QID) | INTRAMUSCULAR | Status: DC | PRN

## 2013-08-30 MED ORDER — IOHEXOL 300 MG/ML  SOLN
50.0000 mL | Freq: Once | INTRAMUSCULAR | Status: AC | PRN
  Administered 2013-08-30: 50 mL via INTRATHECAL

## 2013-08-30 MED ORDER — HEPARIN SOD (PORK) LOCK FLUSH 100 UNIT/ML IV SOLN
500.0000 [IU] | Freq: Once | INTRAVENOUS | Status: DC
  Filled 2013-08-30: qty 5

## 2013-08-30 MED ORDER — ONDANSETRON 8 MG/NS 50 ML IVPB
8.0000 mg | Freq: Four times a day (QID) | INTRAVENOUS | Status: DC | PRN
  Filled 2013-08-30: qty 8

## 2013-08-30 NOTE — Discharge Summary (Signed)
Patient ID: JETHRO RADKE MRN: [id number] DOB/AGE: 46/18/1967 46 y.o.  Admit date: 08/27/2013 Discharge date: 08/30/2013  Primary Care Physician:  Chari Manning, NP  Brief H and P: For complete details please refer to admission H and P and Progress note from 08/30/13, but in brief Mason [name] 48 y.o.male admitted on 5/12 because of uncontrolled nausea, vomiting with dehydration. This patient has locally advanced tonsil cancer.   Discharge Diagnoses:    #1 Locally advanced tonsil cancer  The patient is not able to tolerate outpatient management.  His next chemotherapy is due on 09/13/13.  The patient is ready to resume radiation treatment today.   #2 Uncontrolled nausea and vomiting  Symptoms were severe on presentation. He received IV fluids and around the clock IV anti-emetics with good response. Today, these symptoms are resolved.  His mucus production contributed to some gagging, ameliorated with Mucinex. Pe patient report, he did not remember irrigation instructions and was using the balloon port, which may have worsen symptoms. Abdominal x-ray on 5/13 unremarkable and G-tube in place. General Surgery has been consulted on 5/14 and scheduled a tubogram for 5/15, rule out any abnormalities.   #3 Severe dehydration with acute renal failure, resolving  Secondary to vomiting. Improved with continuous IV fluid resuscitation. IV fluid rated was decreased to 75 cc per hour as patient can tolerate oral intake better.  Acute renal failure resolved with hydration.   #4 Weight loss with malnutrition  Nutrition service consulted patient on 5/12.  On liquid diet for now.  Oral intake improving as appetite increased   #5 DVT prophylaxis  On Lovenox   #6 Cough/Congestion  He was placed on guaifenesin bid to help with his congestion and mucous buildup which may have exacerbated his nausea and vomiting. His congestion has much improved, for which will discontinue Mucinex in  house, and he was instructed to use guaifenesin over the counter if these symptoms recur. Patient is afebrile and has no other symptoms to suggest infection.   #7 CODE STATUS  Full code   #8: Disposition: to discharge home if tubogram is normal and patient can tolerate foods this morning without nausea or vomiting. Office to scheduled visit prior to scheduled chemotherapy for follow up. He knows to call if he has any questions or concerns to the [facility], [phone]   Discharge Medications:    Medication List         amLODipine 2.5 MG tablet  Commonly known as:  NORVASC  Take 2.5 mg by mouth daily after supper.     chlorhexidine 0.12 % solution  Commonly known as:  PERIDEX  Use as directed 15 mLs in the mouth or throat 2 (two) times daily. Use for 30 seconds after breakfast and dinner. Spit out excess. Do not swallow.     lidocaine-prilocaine cream  Commonly known as:  EMLA  Apply 1 application topically as needed (For port-a-cath.).     magic mouthwash w/lidocaine Soln  Swish and swallow 10 mLs 4 (four) times daily. Take 30 minutes before meals and at bedtime.     naproxen 500 MG tablet  Commonly known as:  NAPROSYN  Take 1 tablet (500 mg total) by mouth 2 (two) times daily with a meal.     ondansetron 8 MG tablet  Commonly known as:  ZOFRAN  Take 1 tablet (8 mg total) by mouth every 8 (eight) hours as needed for nausea or vomiting.     oxyCODONE-acetaminophen 10-325 MG per tablet  Commonly known as:  PERCOCET  Take 1 tablet by mouth every 4 (four) hours as needed for pain.     prochlorperazine 10 MG tablet  Commonly known as:  COMPAZINE  Take 1 tablet (10 mg total) by mouth every 6 (six) hours as needed (Nausea or vomiting).     sodium fluoride 1.1 % Gel dental gel  Commonly known as:  FLUORISHIELD  Place 1 drop onto teeth at bedtime. Instill one drop into each tooth space of fluoride tray. Place over teeth for 5 minutes. Remove. Spit out excess. Repeat  nightly.     sucralfate 1 G tablet  Commonly known as:  CARAFATE  Take 1 g by mouth 4 (four) times daily as needed (Dissolve one tablet in 51ml of water and swallow for sore throat.).        Diet:  Increased as tolerated  Activity:  As tolerated   Disposition and Follow-up:  ECOG PERFORMANCE STATUS: 1 Symptomatic but completely ambulatory (Restricted in physically strenuous activity but ambulatory and able to carry out work of a light or sedentary nature. For example, light housework, office work)   Significant Diagnostic Studies:   08/28/2013   CLINICAL DATA:  Abdominal pain and nausea  EXAM: ABDOMEN - 1 VIEW  COMPARISON:  None.  FINDINGS: There is a gastrostomy type catheter in the left abdomen. Bowel gas pattern is unremarkable. No obstruction or free air is seen on this supine examination. There are small phleboliths in the pelvis.  IMPRESSION: Bowel gas pattern unremarkable.   Electronically Signed   By: Lowella Grip M.D.   On: 08/28/2013 13:00   Tubogram pending at the time of discharge summary   Discharge Laboratory Values: Lab Results  Component Value Date   WBC 10.4 08/27/2013   HGB 13.7 08/27/2013   HCT 40.3 08/27/2013   MCV 86.3 08/27/2013   PLT 232 08/27/2013   Lab Results  Component Value Date   NA 134* 08/29/2013   K 4.3 08/29/2013   CL 98 08/29/2013   CO2 25 08/29/2013      Physical Exam at Discharge: BP 145/87  Pulse 80  Temp(Src) 99.1 F (37.3 C) (Oral)  Resp 16  Ht 6\' 7"  (2.007 m)  Wt 313 lb 0.9 oz (142 kg)  BMI 35.25 kg/m2  SpO2 100% GENERAL: alert, conversant,no nausea or vomiting this morning  SKIN: skin color, texture, turgor are normal, no rashes or significant lesions  EYES: normal, conjunctiva are pink and non-injected, sclera clear  OROPHARYNX: known large oral mass. No mucositis or thrush  NECK: supple, thyroid normal size, non-tender, without nodularity  LYMPH: Palpable lymphadenopathy in both sides of the neck, non- tender to  palpation  LUNGS: clear to auscultation and percussion with normal breathing effort  HEART: tachycardia with no murmurs and no lower extremity edema  ABDOMEN:obese, soft, non-tender and normal bowel sounds. Feeding tube site without erythema or exudates. Feeding tube site non tender to palpation  Musculoskeletal:no cyanosis of digits and no clubbing  PSYCH: alert & oriented x 3 with fluent speech  NEURO: no focal motor/sensory deficits     Signed: Sharene Butters, PA-C   08/30/2013, 8:25 AM I have seen the patient, examined him and edited the notes  [name], MD 08/30/2013 Spent 35 minutes on discharge

## 2013-08-30 NOTE — Progress Notes (Signed)
[facility] Therapy Treatment Record Phone [phone]   Radiation Therapy was administered to Mason [name] on: 08/30/2013  10:03 AM and was treatment # 9 out of a planned [name] of 35 treatments.

## 2013-08-30 NOTE — Progress Notes (Signed)
  Subjective: His biggest complaint now is that he is hungry.    Objective: Vital signs in last 24 hours: Temp:  [98.9 F (37.2 C)-100.2 F (37.9 C)] 99.1 F (37.3 C) (05/15 0428) Pulse Rate:  [80-96] 80 (05/15 0428) Resp:  [16-18] 16 (05/15 0428) BP: (145-157)/(86-101) 145/87 mmHg (05/15 0428) SpO2:  [99 %-100 %] 100 % (05/15 0428) Weight:  [142 kg (313 lb 0.9 oz)] 142 kg (313 lb 0.9 oz) (05/15 0428) Last BM Date: 08/29/13 840 PO recorded +BM Afebrile, BP up some CMP OK renal insuffiencey is better Intake/Output from previous day: 05/14 0701 - 05/15 0700 In: 3042.5 [P.O.:840; I.V.:2112.5] Out: -  Intake/Output this shift:    General appearance: alert, cooperative and no distress GI: soft, non-tender; bowel sounds normal; no masses,  no organomegaly and gastrostomy looks fine.  Lab Results:   Recent Labs  08/27/13 1227  WBC 10.4  HGB 13.7  HCT 40.3  PLT 232    BMET  Recent Labs  08/28/13 0515 08/29/13 0545  NA 133* 134*  K 4.1 4.3  CL 95* 98  CO2 27 25  GLUCOSE 107* 92  BUN 22 18  CREATININE 1.42* 1.25  CALCIUM 9.0 8.7   PT/INR No results found for this basename: LABPROT, INR,  in the last 72 hours   Recent Labs Lab 08/27/13 1227 08/29/13 0545  AST 34 28  ALT 68* 52  ALKPHOS 79 76  BILITOT 0.3 0.4  PROT 7.4 6.5  ALBUMIN 3.8 3.1*     Lipase  No results found for this basename: lipase     Studies/Results: Dg Abd 1 View  08/28/2013   CLINICAL DATA:  Abdominal pain and nausea  EXAM: ABDOMEN - 1 VIEW  COMPARISON:  None.  FINDINGS: There is a gastrostomy type catheter in the left abdomen. Bowel gas pattern is unremarkable. No obstruction or free air is seen on this supine examination. There are small phleboliths in the pelvis.  IMPRESSION: Bowel gas pattern unremarkable.   Electronically Signed   By: Lowella Grip M.D.   On: 08/28/2013 13:00    Medications: . chlorhexidine  10 mL Mouth/Throat QID  . enoxaparin (LOVENOX) injection  40  mg Subcutaneous Q24H  . feeding supplement (ENSURE COMPLETE)  237 mL Oral TID WC & HS  . guaiFENesin  600 mg Oral BID  . lidocaine-prilocaine  1 application Topical Once  . sodium fluoride   dental QHS  . sucralfate  1 g Oral TID WC & HS    Assessment/Plan 1. Locally advanced tonsil cancer  2. Uncontrolled nausea and vomiting after chemotherapy, ongoing since 08/19/13.  3. Dehydration and ARF  4. S/p Portacath and gastrostomy tube placement on 08/15/13.   Plan:  Await UGI.  I reviewed UGI with Dr. Clovis Riley from radiology and it was very normal.  No issue with gastrostomy tube.  I see no reason to be concerned about the gastrostomy tube at this point.  We will see again as needed.  I reported findings to patient and his nurse.    LOS: 3 days    Earnstine Regal 08/30/2013

## 2013-08-30 NOTE — Progress Notes (Signed)
Discharge instructions given.  No questions asked.  Verbalized understanding.  Left via wheelchair with family members. Suzan Slick

## 2013-08-30 NOTE — Telephone Encounter (Signed)
[facility] RN asking if Dr. Alvy Bimler wants to resume homecare on hospital d/c?  Pt has received 3 nursing visits and 1 SW visit so far.  Was originally ordered for PEG tube teaching and pt would likely benefit from a few more visits if ok w/ Dr. Alvy Bimler.

## 2013-08-30 NOTE — Telephone Encounter (Signed)
Per staff message and POF I have scheduled appts.  JMW  

## 2013-08-30 NOTE — Telephone Encounter (Signed)
Informed Cyril Mourning of order to resume home care services.

## 2013-08-30 NOTE — Discharge Instructions (Addendum)
Cancer of the Tonsils Cancer of the tonsils occurs when cells on the outside of the tonsils become abnormal and start to grow out of control. This usually starts in very thin, flat cells that line the surface of your mouth (squamous cells). Cancer cells can spread and form a mass of cells called a tumor. The cancer may spread deeper into the tonsils, or it may spread to other areas of the body (metastasize). RISK FACTORS The exact cause of cancer of the tonsils is not known. However, some risk factors make this more likely:  Use of tobacco products, including cigarettes, pipes, cigars, smokeless (chewing) tobacco, and snuff. This is the number one risk factor of cancer of the tonsils.  Male gender.  Age of 57 years or older.  Frequent use of alcohol.  Human papillomavirus (HPV) infection.  Poor oral hygiene (not brushing or flossing your teeth regularly). SYMPTOMS  One tonsil that is larger than the other.  A sore throat (on just one side) that does not go away on its own or after receiving treatment.  A sore or lump on a tonsil that does not go away.  Difficulty swallowing or chewing.  Ear pain.  Bad breath.  Bleeding in your mouth.  A lump on your neck.  Difficulty when trying to open your mouth. DIAGNOSIS To diagnose cancer of the tonsils, your caregiver may perform the following exams:  A physical exam of your tonsils for a sore or lump. Your caregiver will also look at other parts of your mouth, throat, and neck. Your caregiver may use a mirror with a long handle or a thin, flexible tube with a tiny light and camera at the end (fiberscope) to see the back of your mouth.  Removal and exam of a small number of cells (biopsy) from your tonsils or a lump on your neck. The cells are checked for cancerous formations under a microscope.  Imaging exams, such as X-rays of your mouth and neck. The images can show if there is an abnormal mass. If you do have cancer, your  caregiver will stage your cancer. Staging provides an idea of how advanced your cancer is. The stage of your cancer depends on how much your cancer has grown and if it has metastasized. The meaning of the stage depends on the type of cancer. For cancer of the tonsils:  Stage I means the cancer is the size of a peanut or smaller. It has not metastasized.  Stage II means the cancer is larger than a peanut, but not larger than a walnut. It has not metastasized.  Stage III means the cancer has grown larger than a walnut. It may have spread to a lymph node or lymph gland on the same side of your neck as the cancer. (Lymph is a fluid that carries white blood cells all over your body. White blood cells fight infection.)  Stage IV means the cancer has spread to nearby areas. It may have spread heavily into your lymph glands. TREATMENT Treatment for tonsil cancer can vary. It will depend on the stage of your cancer and your overall health. Treatment options may include:  Radiation therapy. This uses waves of nuclear energy to kill cancer cells on your tonsils. It may be used for stage I or stage II cancers.  Surgery. This may be needed if the cancer has spread to your lymph nodes or if it has spread to other parts of your neck. A procedure may also be done to make  it easier for you to swallow and talk.  A combination of radiation, drugs that kill cancer cells (chemotherapy), and surgery. This may be used for stage III and stage IV cancers. SEEK MEDICAL CARE IF:  You have pain in your throat or in your ear.  Your throat is numb.  You notice changes in the way you speak.  You notice changes in the way you swallow.  You notice a lump on your neck. SEEK IMMEDIATE MEDICAL CARE IF:   You have pain that gets worse. This may be pain in your throat or your ear.  You have bleeding in your mouth.  Your throat or neck swells.  You have difficulty swallowing.  You have difficulty breathing.  You  have difficulty opening your mouth.  You have pain in your mouth.  You have a fever. Document Released: 07/20/2010 Document Revised: 06/27/2011 Document Reviewed: 07/20/2010 [facility] Patient Information 2014 [place].  Cyclic Vomiting Syndrome Cyclic vomiting syndrome is a benign condition in which patients experience bouts or cycles of severe nausea and vomiting that last for hours or even days. The bouts of nausea and vomiting alternate with longer periods of no symptoms and generally good health. Cyclic vomiting syndrome occurs mostly in children, but can affect adults. CAUSES  [facility] has no known cause. Each episode is typically similar to the previous ones. The episodes tend to:   Start at about the same time of day.  Last the same length of time.  Present the same symptoms at the same level of intensity. Cyclic vomiting syndrome can begin at any age in children and adults. Cyclic vomiting syndrome usually starts between the ages of 3 and 7 years. In adults, episodes tend to occur less often than they do in children, but they last longer. Furthermore, the events or situations that trigger episodes in adults cannot always be pinpointed as easily as they can in children. There are 4 phases of cyclic vomiting syndrome: 1. Prodrome. The prodrome phase signals that an episode of nausea and vomiting is about to begin. This phase can last from just a few minutes to several hours. This phase is often marked by belly (abdominal) pain. Sometimes taking medicine early in the prodrome phase can stop an episode in progress. However, sometimes there is no warning. A person may simply wake up in the middle of the night or early morning and begin vomiting. 2. Episode. The episode phase consists of:  Severe vomiting.  Nausea.  Gagging (retching). 3. Recovery. The recovery phase begins when the nausea and vomiting stop. Healthy color, appetite, and energy return. 4. Symptom-free interval. The  symptom-free interval phase is the period between episodes when no symptoms are present. TRIGGERS Episodes can be triggered by an infection or event. Examples of triggers include:  Infections.  Colds, allergies, sinus problems, and the flu.  Eating certain foods such as chocolate or cheese.  Foods with monosodium glutamate (MSG) or preservatives.  Fast foods.  Pre-packaged foods.  Foods with low nutritional value (junk foods).  Overeating.  Eating just before going to bed.  Hot weather.  Dehydration.  Not enough sleep or poor sleep quality.  Physical exhaustion.  Menstruation.  Motion sickness.  Emotional stress (school or home difficulties).  Excitement or stress. SYMPTOMS  The main symptoms of cyclic vomiting syndrome are:  Severe vomiting.  Nausea.  Gagging (retching). Episodes usually begin at night or the first thing in the morning. Episodes may include vomiting or retching up to 5 or 6  times an hour during the worst of the episode. Episodes usually last anywhere from 1 to 4 days. Episodes can last for up to 10 days. Other symptoms include:  Paleness.  Exhaustion.  Listlessness.  Abdominal pain.  Loose stools or diarrhea. Sometimes the nausea and vomiting are so severe that a person appears to be almost unconscious. Sensitivity to light, headache, fever, dizziness, may also accompany an episode. In addition, the vomiting may cause drooling and excessive thirst. Drinking water usually leads to more vomiting, though the water can dilute the acid in the vomit, making the episode a little less painful. Continuous vomiting can lead to dehydration, which means that the body has lost excessive water and salts. DIAGNOSIS  Cyclic vomiting syndrome is hard to diagnose because there are no clear tests to identify it. A caregiver must diagnose cyclic vomiting syndrome by looking at symptoms and medical history. A caregiver must exclude more common diseases or  disorders that can also cause nausea and vomiting. Also, diagnosis takes time because caregivers need to identify a pattern or cycle to the vomiting. TREATMENT  Cyclic vomiting syndrome cannot be cured. Treatment varies, but people with cyclic vomiting syndrome should get plenty of rest and sleep and take medications that prevent, stop, or lessen the vomiting episodes and other symptoms. People whose episodes are frequent and long-lasting may be treated during the symptom-free intervals in an effort to prevent or ease future episodes. The symptom-free phase is a good time to eliminate anything known to trigger an episode. For example, if episodes are brought on by stress or excitement, this period is the time to find ways to reduce stress and stay calm. If sinus problems or allergies cause episodes, those conditions should be treated. The triggers listed above should be avoided or prevented. Because of the similarities between migraine and cyclic vomiting syndrome, caregivers treat some people with severe cyclic vomiting syndrome with drugs that are also used for migraine headaches. The drugs are designed to:  Prevent episodes.  Reduce their frequency.  Lessen their severity. HOME CARE INSTRUCTIONS Once a vomiting episode begins, treatment is supportive. It helps to stay in bed and sleep in a dark, quiet room. Severe nausea and vomiting may require hospitalization and intravenous (IV) fluids to prevent dehydration. Relaxing medications (sedatives) may help if the nausea continues. Sometimes, during the prodrome phase, it is possible to stop an episode from happening altogether. Only take over-the-counter or prescription medicines for pain, discomfort or fever as directed by your caregiver. Do not give aspirin to children. During the recovery phase, drinking water and replacing lost electrolytes (salts in the blood) are very important. Electrolytes are salts that the body needs to function well and stay  healthy. Symptoms during the recovery phase can vary. Some people find that their appetites return to normal immediately, while others need to begin by drinking clear liquids and then move slowly to solid food. RELATED COMPLICATIONS The severe vomiting that defines cyclic vomiting syndrome is a risk factor for several complications:  Dehydration Vomiting causes the body to lose water quickly.  Electrolyte imbalance Vomiting also causes the body to lose the important salts it needs to keep working properly.  Peptic esophagitis The tube that connects the mouth to the stomach (esophagus) becomes injured from the stomach acid that comes up with the vomit.  Hematemesis The esophagus becomes irritated and bleeds, so blood mixes with the vomit.  Mallory-Weiss tear The lower end of the esophagus may tear open or the stomach may  bruise from vomiting or retching.  Tooth decay The acid in the vomit can hurt the teeth by corroding the tooth enamel. SEEK MEDICAL CARE IF: You have questions or problems. Document Released: 06/13/2001 Document Revised: 06/27/2011 Document Reviewed: 07/12/2010 [facility] Patient Information 2014 [place], [place].  Cough, Adult  A cough is a reflex. It helps you clear your throat and airways. A cough can help heal your body. A cough can last 2 or 3 weeks (acute) or may last more than 8 weeks (chronic). Some common causes of a cough can include an infection, allergy, or a cold. HOME CARE  Only take medicine as told by your doctor.  If given, take your medicines (antibiotics) as told. Finish them even if you start to feel better.  Use a cold steam vaporizer or humidier in your home. This can help loosen thick spit (secretions).  Sleep so you are almost sitting up (semi-upright). Use pillows to do this. This helps reduce coughing.  Rest as needed.  Stop smoking if you smoke. GET HELP RIGHT AWAY IF:  You have yellowish-white fluid (pus) in your thick spit.  Your cough  gets worse.  Your medicine does not reduce coughing, and you are losing sleep.  You cough up blood.  You have trouble breathing.  Your pain gets worse and medicine does not help.  You have a fever. MAKE SURE YOU:   Understand these instructions.  Will watch your condition.  Will get help right away if you are not doing well or get worse. Document Released: 12/16/2010 Document Revised: 06/27/2011 Document Reviewed: 12/16/2010 [facility] Patient Information 2014 [place].  Dehydration, Adult Dehydration is when you lose more fluids from the body than you take in. Vital organs like the kidneys, brain, and heart cannot function without a proper amount of fluids and salt. Any loss of fluids from the body can cause dehydration.  CAUSES   Vomiting.  Diarrhea.  Excessive sweating.  Excessive urine output.  Fever. SYMPTOMS  Mild dehydration  Thirst.  Dry lips.  Slightly dry mouth. Moderate dehydration  Very dry mouth.  Sunken eyes.  Skin does not bounce back quickly when lightly pinched and released.  Dark urine and decreased urine production.  Decreased tear production.  Headache. Severe dehydration  Very dry mouth.  Extreme thirst.  Rapid, weak pulse (more than 100 beats per minute at rest).  Cold hands and feet.  Not able to sweat in spite of heat and temperature.  Rapid breathing.  Blue lips.  Confusion and lethargy.  Difficulty being awakened.  Minimal urine production.  No tears. DIAGNOSIS  Your caregiver will diagnose dehydration based on your symptoms and your exam. Blood and urine tests will help confirm the diagnosis. The diagnostic evaluation should also identify the cause of dehydration. TREATMENT  Treatment of mild or moderate dehydration can often be done at home by increasing the amount of fluids that you drink. It is best to drink small amounts of fluid more often. Drinking too much at one time can make vomiting worse. Refer  to the home care instructions below. Severe dehydration needs to be treated at the hospital where you will probably be given intravenous (IV) fluids that contain water and electrolytes. HOME CARE INSTRUCTIONS   Ask your caregiver about specific rehydration instructions.  Drink enough fluids to keep your urine clear or pale yellow.  Drink small amounts frequently if you have nausea and vomiting.  Eat as you normally do.  Avoid:  Foods or drinks high in  sugar.  Carbonated drinks.  Juice.  Extremely hot or cold fluids.  Drinks with caffeine.  Fatty, greasy foods.  Alcohol.  Tobacco.  Overeating.  Gelatin desserts.  Wash your hands well to avoid spreading bacteria and viruses.  Only take over-the-counter or prescription medicines for pain, discomfort, or fever as directed by your caregiver.  Ask your caregiver if you should continue all prescribed and over-the-counter medicines.  Keep all follow-up appointments with your caregiver. SEEK MEDICAL CARE IF:  You have abdominal pain and it increases or stays in one area (localizes).  You have a rash, stiff neck, or severe headache.  You are irritable, sleepy, or difficult to awaken.  You are weak, dizzy, or extremely thirsty. SEEK IMMEDIATE MEDICAL CARE IF:   You are unable to keep fluids down or you get worse despite treatment.  You have frequent episodes of vomiting or diarrhea.  You have blood or green matter (bile) in your vomit.  You have blood in your stool or your stool looks black and tarry.  You have not urinated in 6 to 8 hours, or you have only urinated a small amount of very dark urine.  You have a fever.  You faint. MAKE SURE YOU:   Understand these instructions.  Will watch your condition.  Will get help right away if you are not doing well or get worse. Document Released: 04/04/2005 Document Revised: 06/27/2011 Document Reviewed: 11/22/2010 [facility] Patient Information 2014 [place],  [place].

## 2013-08-30 NOTE — Progress Notes (Signed)
Mason [name]   DOB:February 10, 1966   HD#:[id number]   QJJ#:[id number] I have seen the patient, examined him and edited the notes as follows   Subjective:  Afebrile. His dry heaves are now almost resolved, last one last night; no further vomiting. His pain is controlled with medications. His appetite is increased, trying to push some food. Last bowel movement last night. Mucous congestion is resolved, but still has intermittent dry cough. PEG tube area non tender. Surgery consulted on 5/14, concluding that his nausea and vomiting were not related to mechanical issues related to the gastrotomy tube. Tubogram to be performed this morning to confirm there is no obstruction. He is also going to resume his radiation dose as scheduled  No other issues overnight.   Objective:  Filed Vitals:   08/30/13 0428  BP: 145/87  Pulse: 80  Temp: 99.1 F (37.3 C)  Resp: 16    Body mass index is 35.25 kg/(m^2).  Intake/Output Summary (Last 24 hours) at 08/30/13 0815 Last data filed at 08/30/13 0600  Gross per 24 hour  Intake 3042.5 ml  Output      0 ml  Net 3042.5 ml    GENERAL: alert, conversant,no nausea or vomiting this morning SKIN: skin color, texture, turgor are normal, no rashes or significant lesions  EYES: normal, conjunctiva are pink and non-injected, sclera clear  OROPHARYNX: known large oral mass. No mucositis or thrush NECK: supple, thyroid normal size, non-tender, without nodularity  LYMPH: Palpable lymphadenopathy in both sides of the neck, non- tender to palpation LUNGS: clear to auscultation and percussion with normal breathing effort  HEART: tachycardia with no murmurs and no lower extremity edema  ABDOMEN:obese, soft, non-tender and normal bowel sounds. Feeding tube site without erythema or exudates. Feeding tube site non tender to palpation Musculoskeletal:no cyanosis of digits and no clubbing  PSYCH: alert & oriented x 3 with fluent speech  NEURO: no focal motor/sensory deficits      Labs:   Recent Labs Lab 08/27/13 1227  WBC 10.4  HGB 13.7  HCT 40.3  PLT 232  MCV 86.3  MCH 29.3  MCHC 34.0  RDW 12.2     Chemistries:    Recent Labs Lab 08/27/13 1227 08/28/13 0515 08/29/13 0545  NA 133* 133* 134*  K 4.0 4.1 4.3  CL 91* 95* 98  CO2 30 27 25   GLUCOSE 107* 107* 92  BUN 26* 22 18  CREATININE 1.56* 1.42* 1.25  CALCIUM 9.3 9.0 8.7  AST 34  --  28  ALT 68*  --  52  ALKPHOS 79  --  76  BILITOT 0.3  --  0.4     Liver Function Tests:  Recent Labs Lab 08/27/13 1227 08/29/13 0545  AST 34 28  ALT 68* 52  ALKPHOS 79 76  BILITOT 0.3 0.4  PROT 7.4 6.5  ALBUMIN 3.8 3.1*   I have reviewed the x-rays myself. CLINICAL DATA:   Abdominal pain and nausea  EXAM:  ABDOMEN - 1 VIEW  COMPARISON: None.  FINDINGS:  There is a gastrostomy type catheter in the left abdomen. Bowel gas pattern is unremarkable. No obstruction or free air is seen on this supine examination. There are small phleboliths in the pelvis.  IMPRESSION:  Bowel gas pattern unremarkable.    Assessment/Plan:  #1 Locally advanced tonsil cancer  The patient is not able to tolerate outpatient management.  His next chemotherapy is due on 09/13/13. The patient is ready to resume radiation treatment today.  #2 Uncontrolled  nausea and vomiting  Symptoms were severe on presentation. He received  IV fluids and around the clock IV anti-emetics with good response. Today, these symptoms are resolved. I will discontinue schedule IV antiemetics. His mucus production contributed to some gagging, ameliorated with Mucinex. Pe patient report, he did not remember irrigation instructions and was using the balloon port, which may have worsen symptoms. Abdominal x-ray on 5/13 unremarkable and G-tube in place. General Surgery has been consulted on 5/14 and scheduled a tubogram for 5/15, rule out any abnormalities.  #3 Severe dehydration with acute renal failure, resolving Secondary to vomiting.  Improved with continuous IV fluid resuscitation. IV fluid rated was decreased to 75 cc per hour as patient can tolerate oral intake better. Acute renal failure resolved with hydration.  #4 Weight loss with malnutrition  Nutrition service consulted patient on 5/12.  On liquid diet for now.  Oral intake improving as appetite increased   #5 DVT prophylaxis  On Lovenox   #6 Cough/Congestion He was placed on guaifenesin bid to help with his congestion and mucous buildup which may have exacerbated his nausea and vomiting. His congestion has much improved, for which will discontinue Mucinex in house, and he was instructed to use guaifenesin over the counter if these symptoms recur. Patient is afebrile and has no other symptoms to suggest infection.   #7 CODE STATUS  Full code   #8: Disposition: to discharge home if tubogram is normal and patient can tolerate foods this morning without nausea or vomiting. Office to scheduled visit prior to scheduled chemotherapy for follow up. He knows to call if he has any questions or concerns to the [facility], [phone]  **Disclaimer: This note was dictated with voice recognition software. Similar sounding words can inadvertently be transcribed and this note may contain transcription errors which may not have been corrected upon publication of note.Rondel Jumbo, PA-C 08/30/2013  8:15 AM [name], MD 08/30/2013

## 2013-08-30 NOTE — Telephone Encounter (Signed)
Sent michelle a staff message to add the chemo appt for 09/13/2013.

## 2013-08-30 NOTE — Telephone Encounter (Signed)
Please resume.

## 2013-08-30 NOTE — Telephone Encounter (Signed)
S/w the pt and he is aware to stop by the scheduling office to pick up a new schedule for may.

## 2013-09-02 ENCOUNTER — Ambulatory Visit (HOSPITAL_COMMUNITY)
Admission: RE | Admit: 2013-09-02 | Discharge: 2013-09-02 | Disposition: A | Discharge status: Home / Self Care | Payer: Medicaid Other | Source: Ambulatory Visit | Attending: Radiation Oncology | Admitting: Radiation Oncology

## 2013-09-02 ENCOUNTER — Ambulatory Visit
Admission: RE | Admit: 2013-09-02 | Discharge: 2013-09-02 | Disposition: A | Discharge status: Home / Self Care | Payer: Medicaid Other | Source: Ambulatory Visit | Attending: Radiation Oncology | Admitting: Radiation Oncology

## 2013-09-02 ENCOUNTER — Telehealth (INDEPENDENT_AMBULATORY_CARE_PROVIDER_SITE_OTHER): Payer: Self-pay

## 2013-09-02 ENCOUNTER — Other Ambulatory Visit: Payer: Self-pay | Admitting: Radiation Oncology

## 2013-09-02 ENCOUNTER — Encounter: Payer: Self-pay | Admitting: *Deleted

## 2013-09-02 ENCOUNTER — Encounter: Payer: Self-pay | Admitting: Radiation Oncology

## 2013-09-02 VITALS — BP 107/70 | HR 82 | Temp 98.0°F | Ht 79.0 in | Wt 303.5 lb

## 2013-09-02 DIAGNOSIS — C099 Malignant neoplasm of tonsil, unspecified: Secondary | ICD-10-CM

## 2013-09-02 DIAGNOSIS — K9423 Gastrostomy malfunction: Secondary | ICD-10-CM | POA: Diagnosis not present

## 2013-09-02 DIAGNOSIS — Y833 Surgical operation with formation of external stoma as the cause of abnormal reaction of the patient, or of later complication, without mention of misadventure at the time of the procedure: Secondary | ICD-10-CM | POA: Insufficient documentation

## 2013-09-02 DIAGNOSIS — Z510 Encounter for antineoplastic radiation therapy: Secondary | ICD-10-CM | POA: Diagnosis not present

## 2013-09-02 MED ORDER — IOHEXOL 300 MG/ML  SOLN
10.0000 mL | Freq: Once | INTRAMUSCULAR | Status: AC | PRN
  Administered 2013-09-02: 10 mL

## 2013-09-02 NOTE — Telephone Encounter (Signed)
Received call from [facility] at Rad onc re: pts tube. She states she cut suture to relieve pressure on skin from flange. She states now flange is moving and she is concerned the balloon may no longer be intact. She is sending pt to IVR to have tube evaluated. I advised her that this will be sent to Dr Rosendo Gros so he will be aware.

## 2013-09-02 NOTE — Progress Notes (Signed)
Weekly Management Note:  Site: Right tonsil/bilateral neck Current Dose:  1800  cGy Projected Dose: 7000  cGy  Narrative: The patient is seen today for routine under treatment assessment. CBCT/MVCT images/port films were reviewed. The chart was reviewed.   His nausea and vomiting have resolved. He has intermittent difficulty hearing. He reports vitals ration along the inferior button of his PEG tube.  Physical Examination:  Filed Vitals:   09/02/13 0956  BP: 107/70  Pulse: 82  Temp: 98 F (36.7 C)  .  Weight: 303 lb 8 oz (137.667 kg). Oral cavity and oropharynx are without candidiasis. There is no significant mucositis. Inspection of his PEG tube there is slight ulceration along the button/stopper.  Laboratory data: Lab Results  Component Value Date   WBC 10.4 08/27/2013   HGB 13.7 08/27/2013   HCT 40.3 08/27/2013   MCV 86.3 08/27/2013   PLT 232 08/27/2013     Impression: Tolerating radiation therapy well. We will check with Dr. Rosendo Gros regarding his PEG tube. His intermittent hearing loss may be secondary to serous otitis media.  Plan: Continue radiation therapy as planned.

## 2013-09-02 NOTE — Telephone Encounter (Signed)
The nurse in onc was concerned because the tubing was moving also and she did not want to risk it coming out. From her call with me this is why she sent him to be sure the tube was not going to pull too far out.

## 2013-09-02 NOTE — Progress Notes (Signed)
Mason [name] has received 9 fractions to his his right tonsillar region and bilateral neck.  He denies any pain presently.  He had a recent admission for nausea and vomiting, but resolved.  He reports that he he notes changes in his hearing and have perirds of not being able to hear at all, to having a heightened sense of hearing inwhich irritates his ears. PEG tube site has broken skin in the sutured area at the bottom of his flange and area tender to touch..  Note yellowish drainage at site. Area cleansed with sterile NS.

## 2013-09-02 NOTE — Procedures (Signed)
Successful fluoroscopic guided replacement of 18 Fr gastrostomy tube.  No immediate post procedural complications.  The feeding tube is ready for immediate use.

## 2013-09-02 NOTE — Progress Notes (Signed)
Mr. Fariss sent to interventional radiology at ~ 11:55am.  Once the sutures on the flange were removed, as ordered by Dr. Rosendo Gros via Holley Raring, [place], the area under the flange was cleansed  with sterile NS, dried, a split sponge was applied then secured with tape. Left the room and when reentered the room noted  the tube was literally sliding on its own from the insertion site.  The tube was pulled lighty, but could not feel any resistance.  Had to place tape on the actual flange to prevent movement of the tube.  Called to relay this to Dr. Johney Frame office and was advised to have interventional radiology check placement.  Mr Casebolt is currently in interventional radiology as ordered by Dr. Rexene Edison.

## 2013-09-02 NOTE — Telephone Encounter (Signed)
The outside flange can move, that's what its meant to do so it can be adjusted tighter or looser.  I just talked with Holley Raring about him, but didn't mention anything about this.

## 2013-09-02 NOTE — Addendum Note (Signed)
Encounter addended by: Deirdre Evener, RN on: 09/02/2013  1:06 PM<BR>     Documentation filed: Notes Section

## 2013-09-02 NOTE — Telephone Encounter (Signed)
C.Cheston Rn [facility] ask for order to remove G-tube suture due to skin break down. Dr. Lyman Bishop gave order to remove suture , apply drain sponge to G-Tube site. C.Cheston RN  aware

## 2013-09-02 NOTE — Progress Notes (Signed)
Met with patient after RT and UT appt with Dr. Valere Dross.  Escorted pt to Radiology for check of g-tube by IR.  Continuing navigation as L1 patient (new patient).  Gayleen Orem, RN, BSN, [facility] Neck Oncology Navigator [phone]'

## 2013-09-03 ENCOUNTER — Ambulatory Visit
Admission: RE | Admit: 2013-09-03 | Discharge: 2013-09-03 | Disposition: A | Discharge status: Home / Self Care | Payer: Medicaid Other | Source: Ambulatory Visit | Attending: Radiation Oncology | Admitting: Radiation Oncology

## 2013-09-03 ENCOUNTER — Encounter (HOSPITAL_COMMUNITY): Payer: Self-pay | Admitting: Dentistry

## 2013-09-03 ENCOUNTER — Ambulatory Visit (HOSPITAL_COMMUNITY): Payer: Medicaid - Dental | Admitting: Dentistry

## 2013-09-03 VITALS — BP 122/81 | HR 78 | Temp 98.3°F | Wt 303.0 lb

## 2013-09-03 DIAGNOSIS — R432 Parageusia: Secondary | ICD-10-CM

## 2013-09-03 DIAGNOSIS — R439 Unspecified disturbances of smell and taste: Secondary | ICD-10-CM

## 2013-09-03 DIAGNOSIS — T148XXD Other injury of unspecified body region, subsequent encounter: Secondary | ICD-10-CM

## 2013-09-03 DIAGNOSIS — R1310 Dysphagia, unspecified: Secondary | ICD-10-CM

## 2013-09-03 DIAGNOSIS — C099 Malignant neoplasm of tonsil, unspecified: Secondary | ICD-10-CM

## 2013-09-03 DIAGNOSIS — K089 Disorder of teeth and supporting structures, unspecified: Secondary | ICD-10-CM

## 2013-09-03 DIAGNOSIS — K117 Disturbances of salivary secretion: Secondary | ICD-10-CM

## 2013-09-03 DIAGNOSIS — R634 Abnormal weight loss: Secondary | ICD-10-CM

## 2013-09-03 DIAGNOSIS — R682 Dry mouth, unspecified: Secondary | ICD-10-CM

## 2013-09-03 DIAGNOSIS — Z0189 Encounter for other specified special examinations: Secondary | ICD-10-CM

## 2013-09-03 DIAGNOSIS — Z510 Encounter for antineoplastic radiation therapy: Secondary | ICD-10-CM | POA: Diagnosis not present

## 2013-09-03 NOTE — Progress Notes (Signed)
09/03/2013  Patient:            Mason [name] Date of Birth:  1966/01/27 MRN:                [id number]  BP 122/81  Pulse 78  Temp(Src) 98.3 F (36.8 C) (Oral)  [name] presents for periodic oral examination during radiation therapy. Patient has completed 10/25 radiation treatments.  REVIEW OF CHIEF COMPLAINTS:  DRY MOUTH: Yes. HARD TO SWALLOW: Yes, at times.  HURT TO SWALLOW: No TASTE CHANGES: Yes SORES IN MOUTH: Yes TRISMUS: No discomfort. WEIGHT: 303 pounds. Loss of 15 pounds.  HOME OH REGIMEN:  BRUSHING: Twice a day  FLOSSING:  daily RINSING: He is a salt water and baking soda rinses and magic mouthwash. Patient also using chlorhexidine rinses. FLUORIDE: At bedtime TRISMUS EXERCISES:  Maximum interincisal opening: 38 mm  The patient was instructed to perform trismus exercises daily as instructed.   DENTAL EXAM:  Oral Hygiene:(PLAQUE): Plaque noted. Oral hygiene instruction provided. LOCATION OF MUCOSITIS: Left lateral tongue. Left buccal mucosa. DESCRIPTION OF SALIVA: Decreased. Incipient xerostomia. ANY EXPOSED BONE: Left lateral retromolar area. Measures 2 mm x 1 cm long.  OTHER WATCHED AREAS: Maxillary right maxillary left molar extraction sites are healing in by secondary intention. DX: Xerostomia, Dysgeusia, Dysphagia, Weight Loss, Accretions, Mucositis and Delayed healing, and exposed left mandible  Patient is aware of the delayed healing of the upper right upper left maxillary extraction sites. Patient also is aware of the exposed bone involving the left mandible. Due to current chemoradiation therapy, no further sequestrectomy or partial ostectomy will be performed.  RECOMMENDATIONS: 1. Brush after meals and at bedtime.  Use fluoride at bedtime. 2. Continue chlorhexidine rinses as prescribed 3. Use trismus exercises as directed. 4. Use Biotene Rinse or salt water/baking soda rinses. 5. Multiple sips of water as needed. 6. Return to clinic in two  months for periodic oral exam after radiation therapy. Call if problems before then.  [name], DDS

## 2013-09-03 NOTE — Patient Instructions (Signed)
RECOMMENDATIONS: 1. Brush after meals and at bedtime.  Use fluoride at bedtime. 2. Continue chlorhexidine rinses as prescribed 3. Use trismus exercises as directed. 4. Use Biotene Rinse or salt water/baking soda rinses. 5. Multiple sips of water as needed. 6. Return to clinic in two months for periodic oral exam after radiation therapy. Call if problems before then.  [name], DDS   TRISMUS  Trismus is a condition where the jaw does not allow the mouth to open as wide as it usually does.  This can happen almost suddenly, or in other cases the process is so slow, it is hard to notice it-until it is too far along.  When the jaw joints and/or muscles have been exposed to radiation treatments, the onset of Trismus is very slow.  This is because the muscles are losing their stretching ability over a long period of time, as long as 2 YEARS after the end of radiation.  It is therefore important to exercise these muscles and joints.  TRISMUS EXERCISES   Stack of tongue depressors measuring the same or a little less than the last documented MIO (Maximum Interincisal Opening).  Secure them with a rubber band on both ends.  Place the stack in the patient's mouth, supporting the other end.  Allow 30 seconds for muscle stretching.  Rest for a few seconds.  Repeat 3-5 times  For all radiation patients, this exercise is recommended in the mornings and evenings unless otherwise instructed.  The exercise should be done for a period of 2 YEARS after the end of radiation.  MIO should be checked routinely on recall dental visits by the general dentist or the hospital dentist.  The patient is advised to report any changes, soreness, or difficulties encountered when doing the exercises.

## 2013-09-04 ENCOUNTER — Ambulatory Visit
Admission: RE | Admit: 2013-09-04 | Discharge: 2013-09-04 | Disposition: A | Discharge status: Home / Self Care | Payer: Medicaid Other | Source: Ambulatory Visit | Attending: Radiation Oncology | Admitting: Radiation Oncology

## 2013-09-04 DIAGNOSIS — Z510 Encounter for antineoplastic radiation therapy: Secondary | ICD-10-CM | POA: Diagnosis not present

## 2013-09-05 ENCOUNTER — Ambulatory Visit
Admission: RE | Admit: 2013-09-05 | Discharge: 2013-09-05 | Disposition: A | Discharge status: Home / Self Care | Payer: Medicaid Other | Source: Ambulatory Visit | Attending: Radiation Oncology | Admitting: Radiation Oncology

## 2013-09-05 ENCOUNTER — Ambulatory Visit (HOSPITAL_BASED_OUTPATIENT_CLINIC_OR_DEPARTMENT_OTHER): Payer: Medicaid Other | Admitting: Hematology and Oncology

## 2013-09-05 ENCOUNTER — Encounter: Payer: Self-pay | Admitting: Hematology and Oncology

## 2013-09-05 VITALS — BP 123/82 | HR 79 | Temp 98.2°F | Resp 20 | Ht 79.0 in | Wt 298.6 lb

## 2013-09-05 DIAGNOSIS — C099 Malignant neoplasm of tonsil, unspecified: Secondary | ICD-10-CM

## 2013-09-05 DIAGNOSIS — R112 Nausea with vomiting, unspecified: Secondary | ICD-10-CM

## 2013-09-05 DIAGNOSIS — Z510 Encounter for antineoplastic radiation therapy: Secondary | ICD-10-CM | POA: Diagnosis not present

## 2013-09-05 DIAGNOSIS — E46 Unspecified protein-calorie malnutrition: Secondary | ICD-10-CM

## 2013-09-05 DIAGNOSIS — R070 Pain in throat: Secondary | ICD-10-CM

## 2013-09-05 MED ORDER — OXYCODONE HCL 10 MG PO TABS: 10.0000 mg | ORAL_TABLET | ORAL | Status: DC | PRN

## 2013-09-05 NOTE — Progress Notes (Signed)
[place] OFFICE PROGRESS NOTE  Patient Care Team: Chari Manning, NP as PCP - General (Internal Medicine) [name] Patient (General Practice) Brooks Sailors, RN as Registered Nurse (Oncology) [name], MD as Referring Physician (Otolaryngology) [name], MD as Consulting Physician (Hematology and Oncology)  DIAGNOSIS: HPV positive tonsil cancer, for further management  SUMMARY OF ONCOLOGIC HISTORY: Oncology History   Tonsillar cancer, HPV positive   Primary site: Pharynx - Oropharynx (Right)   Staging method: AJCC 7th Edition   Clinical: Stage IVA (T2, N2b, M0) signed by [name], MD on 08/19/2013  1:24 PM   Summary: Stage IVA (T2, N2b, M0)       Tonsillar cancer   07/09/2013 Procedure 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 FNA of right level III lymph node was inconclusive for cancer   07/25/2013 Imaging PET scan showed locally advanced disease with abnormal lymphadenopathy in the right axilla   08/06/2013 Procedure CT-guided biopsy of the lymphadenopathy was negative for malignancy   08/15/2013 Surgery Patient has placement of port and feeding tube   08/19/2013 -  Chemotherapy Patient received chemotherapy with cisplatin   08/19/2013 -  Radiation Therapy Patient received radiation treatment    INTERVAL HISTORY: Mason [name] 48 y.o. male returns for further followup. He feels wonderful. Denies any swallowing difficulties. Denies recurrent hemoptysis. His nausea and vomiting has resolved. He is able to tolerate oral intake and using his feeding tube on a regular basis. He denies dizziness. He has mild pain around his feeding tube site. His been taking pain medicine as needed. Denies any constipation.  I have reviewed the past medical history, past surgical history, social history and family history with the patient and they are unchanged from previous note.  ALLERGIES:  is allergic to pollen  extract.  MEDICATIONS:  Current Outpatient Prescriptions  Medication Sig Dispense Refill  . Alum & Mag Hydroxide-Simeth (MAGIC MOUTHWASH W/LIDOCAINE) SOLN Swish and swallow 10 mLs 4 (four) times daily. Take 30 minutes before meals and at bedtime.      Marland Kitchen amLODipine (NORVASC) 2.5 MG tablet Take 2.5 mg by mouth daily after supper.      . chlorhexidine (PERIDEX) 0.12 % solution Use as directed 15 mLs in the mouth or throat 2 (two) times daily. Use for 30 seconds after breakfast and dinner. Spit out excess. Do not swallow.      . lidocaine-prilocaine (EMLA) cream Apply 1 application topically as needed (For port-a-cath.).      Marland Kitchen naproxen (NAPROSYN) 500 MG tablet Take 1 tablet (500 mg total) by mouth 2 (two) times daily with a meal.  30 tablet  2  . ondansetron (ZOFRAN) 8 MG tablet Take 1 tablet (8 mg total) by mouth every 8 (eight) hours as needed for nausea or vomiting.  60 tablet  1  . oxyCODONE-acetaminophen (PERCOCET) 10-325 MG per tablet Take 1 tablet by mouth every 4 (four) hours as needed for pain.  30 tablet  0  . prochlorperazine (COMPAZINE) 10 MG tablet Take 1 tablet (10 mg total) by mouth every 6 (six) hours as needed (Nausea or vomiting).  30 tablet  2  . sodium fluoride (FLUORISHIELD) 1.1 % GEL dental gel Place 1 drop onto teeth at bedtime. Instill one drop into each tooth space of fluoride tray. Place over teeth for 5 minutes. Remove. Spit out excess. Repeat nightly.      . sucralfate (CARAFATE) 1 G tablet Take 1 g by mouth 4 (  four) times daily as needed (Dissolve one tablet in 34ml of water and swallow for sore throat.).      Marland Kitchen oxyCODONE 10 MG TABS Take 1 tablet (10 mg total) by mouth every 4 (four) hours as needed for severe pain.  60 tablet  0   No current facility-administered medications for this visit.    REVIEW OF SYSTEMS:   Constitutional: Denies fevers, chills or abnormal weight loss Eyes: Denies blurriness of vision Ears, nose, mouth, throat, and face: Denies mucositis or  sore throat Respiratory: Denies cough, dyspnea or wheezes Cardiovascular: Denies palpitation, chest discomfort or lower extremity swelling Gastrointestinal:  Denies nausea, heartburn or change in bowel habits Skin: Denies abnormal skin rashes Lymphatics: Denies new lymphadenopathy or easy bruising Neurological:Denies numbness, tingling or new weaknesses Behavioral/Psych: Mood is stable, no new changes  All other systems were reviewed with the patient and are negative.  PHYSICAL EXAMINATION: ECOG PERFORMANCE STATUS: 0 - Asymptomatic  Filed Vitals:   09/05/13 1024  BP: 123/82  Pulse: 79  Temp: 98.2 F (36.8 C)  Resp: 20   Filed Weights   09/05/13 1024  Weight: 298 lb 9.6 oz (135.444 kg)    GENERAL:alert, no distress and comfortable SKIN: skin color, texture, turgor are normal, no rashes or significant lesions EYES: normal, Conjunctiva are pink and non-injected, sclera clear OROPHARYNX:no exudate, no erythema and lips, buccal mucosa, and tongue normal. previously noted oropharyngeal mass has regressed and not noticeable anymore. NECK: supple, thyroid normal size, non-tender, without nodularity LYMPH:  no palpable lymphadenopathy in the cervical, axillary or inguinal LUNGS: clear to auscultation and percussion with normal breathing effort HEART: regular rate & rhythm and no murmurs and no lower extremity edema ABDOMEN:abdomen soft, non-tender and normal bowel sounds. Feeding tube site looks okay Musculoskeletal:no cyanosis of digits and no clubbing  NEURO: alert & oriented x 3 with fluent speech, no focal motor/sensory deficits  LABORATORY DATA:  I have reviewed the data as listed    Component Value Date/Time   NA 134* 08/29/2013 0545   NA 139 08/19/2013 0831   K 4.3 08/29/2013 0545   K 4.5 08/19/2013 0831   CL 98 08/29/2013 0545   CO2 25 08/29/2013 0545   CO2 27 08/19/2013 0831   GLUCOSE 92 08/29/2013 0545   GLUCOSE 101 08/19/2013 0831   BUN 18 08/29/2013 0545   BUN 12.2 08/19/2013  0831   CREATININE 1.25 08/29/2013 0545   CREATININE 1.3 08/19/2013 0831   CALCIUM 8.7 08/29/2013 0545   CALCIUM 10.1 08/19/2013 0831   PROT 6.5 08/29/2013 0545   PROT 7.8 08/19/2013 0831   ALBUMIN 3.1* 08/29/2013 0545   ALBUMIN 3.9 08/19/2013 0831   AST 28 08/29/2013 0545   AST 40* 08/19/2013 0831   ALT 52 08/29/2013 0545   ALT 50 08/19/2013 0831   ALKPHOS 76 08/29/2013 0545   ALKPHOS 67 08/19/2013 0831   BILITOT 0.4 08/29/2013 0545   BILITOT 0.60 08/19/2013 0831   GFRNONAA 68* 08/29/2013 0545   GFRAA 78* 08/29/2013 0545    No results found for this basename: SPEP, UPEP,  kappa and lambda light chains    Lab Results  Component Value Date   WBC 10.4 08/27/2013   NEUTROABS 9.6* 08/19/2013   HGB 13.7 08/27/2013   HCT 40.3 08/27/2013   MCV 86.3 08/27/2013   PLT 232 08/27/2013      Chemistry      Component Value Date/Time   NA 134* 08/29/2013 0545   NA 139 08/19/2013 0831  K 4.3 08/29/2013 0545   K 4.5 08/19/2013 0831   CL 98 08/29/2013 0545   CO2 25 08/29/2013 0545   CO2 27 08/19/2013 0831   BUN 18 08/29/2013 0545   BUN 12.2 08/19/2013 0831   CREATININE 1.25 08/29/2013 0545   CREATININE 1.3 08/19/2013 0831      Component Value Date/Time   CALCIUM 8.7 08/29/2013 0545   CALCIUM 10.1 08/19/2013 0831   ALKPHOS 76 08/29/2013 0545   ALKPHOS 67 08/19/2013 0831   AST 28 08/29/2013 0545   AST 40* 08/19/2013 0831   ALT 52 08/29/2013 0545   ALT 50 08/19/2013 0831   BILITOT 0.4 08/29/2013 0545   BILITOT 0.60 08/19/2013 0831    ASSESSMENT & PLAN:  #1 tonsil cancer He appears to be responding to treatment. We will resume chemotherapy with cycle 2 next week. #2 recent uncontrolled nausea and vomiting I suspect it was related to local issues regarding his feeding tube. Just to be sure, with his future treatment, I am going to schedule IV fluids every day after chemotherapy to prevent risk of nausea, vomiting and dehydration. #3 mild pain around feeding tube site I give him prescription of oxycodone to use as needed. I warned him  about risk of nausea and constipation #4 malnutrition Dietitian will continue to follow. Continue to encourage him to use his feeding tube as needed to maintain his weight.  All questions were answered. The patient knows to call the clinic with any problems, questions or concerns. No barriers to learning was detected.    [name], MD 09/05/2013 3:22 PM

## 2013-09-05 NOTE — Progress Notes (Signed)
To provide support, encouragement and care continuity, met with patient and his fiancee during appt with Dr. Alvy Bimler.  I encouraged him to call me with any concerns during upcoming daily fluid infusion so they can be addressed.  He verbalized understanding.  Initiating navigation as L2 patient (treatments established) with this encounter.  Gayleen Orem, RN, BSN, [facility] Head & Neck Oncology Navigator [phone]

## 2013-09-06 ENCOUNTER — Ambulatory Visit (HOSPITAL_BASED_OUTPATIENT_CLINIC_OR_DEPARTMENT_OTHER): Payer: Medicaid Other

## 2013-09-06 ENCOUNTER — Ambulatory Visit
Admission: RE | Admit: 2013-09-06 | Discharge: 2013-09-06 | Disposition: A | Discharge status: Home / Self Care | Payer: Medicaid Other | Source: Ambulatory Visit | Attending: Radiation Oncology | Admitting: Radiation Oncology

## 2013-09-06 ENCOUNTER — Telehealth: Payer: Self-pay | Admitting: *Deleted

## 2013-09-06 ENCOUNTER — Other Ambulatory Visit: Payer: Self-pay | Admitting: *Deleted

## 2013-09-06 ENCOUNTER — Telehealth: Payer: Self-pay | Admitting: Hematology and Oncology

## 2013-09-06 ENCOUNTER — Other Ambulatory Visit: Payer: Self-pay | Admitting: Hematology and Oncology

## 2013-09-06 DIAGNOSIS — C099 Malignant neoplasm of tonsil, unspecified: Secondary | ICD-10-CM

## 2013-09-06 DIAGNOSIS — Z510 Encounter for antineoplastic radiation therapy: Secondary | ICD-10-CM | POA: Diagnosis not present

## 2013-09-06 LAB — COMPREHENSIVE METABOLIC PANEL (CC13)
ALT: 46 U/L (ref 0–55)
AST: 31 U/L (ref 5–34)
Albumin: 3.4 g/dL — ABNORMAL LOW (ref 3.5–5.0)
Alkaline Phosphatase: 83 U/L (ref 40–150)
Anion Gap: 9 mEq/L (ref 3–11)
BUN: 10.6 mg/dL (ref 7.0–26.0)
CALCIUM: 9.5 mg/dL (ref 8.4–10.4)
CHLORIDE: 100 meq/L (ref 98–109)
CO2: 29 mEq/L (ref 22–29)
CREATININE: 1.2 mg/dL (ref 0.7–1.3)
Glucose: 77 mg/dl (ref 70–140)
Potassium: 4.7 mEq/L (ref 3.5–5.1)
SODIUM: 137 meq/L (ref 136–145)
TOTAL PROTEIN: 7.2 g/dL (ref 6.4–8.3)
Total Bilirubin: 0.34 mg/dL (ref 0.20–1.20)

## 2013-09-06 LAB — CBC WITH DIFFERENTIAL/PLATELET
BASO%: 0.4 % (ref 0.0–2.0)
Basophils Absolute: 0 10*3/uL (ref 0.0–0.1)
EOS%: 3.2 % (ref 0.0–7.0)
Eosinophils Absolute: 0.1 10*3/uL (ref 0.0–0.5)
HEMATOCRIT: 36.9 % — AB (ref 38.4–49.9)
HEMOGLOBIN: 12 g/dL — AB (ref 13.0–17.1)
LYMPH%: 14.6 % (ref 14.0–49.0)
MCH: 29.2 pg (ref 27.2–33.4)
MCHC: 32.5 g/dL (ref 32.0–36.0)
MCV: 90 fL (ref 79.3–98.0)
MONO#: 0.6 10*3/uL (ref 0.1–0.9)
MONO%: 13.2 % (ref 0.0–14.0)
NEUT#: 2.9 10*3/uL (ref 1.5–6.5)
NEUT%: 68.6 % (ref 39.0–75.0)
Platelets: 295 10*3/uL (ref 140–400)
RBC: 4.1 10*6/uL — ABNORMAL LOW (ref 4.20–5.82)
RDW: 13.1 % (ref 11.0–14.6)
WBC: 4.2 10*3/uL (ref 4.0–10.3)
lymph#: 0.6 10*3/uL — ABNORMAL LOW (ref 0.9–3.3)

## 2013-09-06 LAB — MAGNESIUM (CC13): Magnesium: 2.3 mg/dl (ref 1.5–2.5)

## 2013-09-06 MED ORDER — BIAFINE EX EMUL
CUTANEOUS | Status: DC | PRN
  Administered 2013-09-06: 11:00:00 via TOPICAL

## 2013-09-06 NOTE — Telephone Encounter (Signed)
Pt came for labs today,  Reviewed by Dr. Alvy Bimler.  They are improved.  Called pt and left VM that his labs are good and to keep his appt on Tues 5/26 as scheduled.  Please call us back if any questions.

## 2013-09-06 NOTE — Telephone Encounter (Signed)
Pt did get scheduled for lab appt today.  He already went home after Radiation this morning.  Called pt at home and he said he had forgotten that Dr. Alvy Bimler said he needed labs today.  Asked pt if he can come back for lab today?  He said he will try his best to get back here by 2:30 pm.

## 2013-09-06 NOTE — Telephone Encounter (Signed)
s.w. pt and advised on nxt week appt...lvm for cameo to sched ivf at sick[facility] cell wed and thurs.

## 2013-09-06 NOTE — Telephone Encounter (Signed)
Dr. Alvy Bimler ordered IVFs for pt all next week.  There is no availability here at [facility] on 5/27 and 5/28.  S/w Charlene in sickle cell clinic and scheduled pt for IVFs at their clinic at 1 pm on 5/27 and 5/28.  They do not have any room to take pt sooner that day, after his Radiation appts.

## 2013-09-10 ENCOUNTER — Ambulatory Visit
Admission: RE | Admit: 2013-09-10 | Discharge: 2013-09-10 | Disposition: A | Discharge status: Home / Self Care | Payer: Medicaid Other | Source: Ambulatory Visit | Attending: Radiation Oncology | Admitting: Radiation Oncology

## 2013-09-10 ENCOUNTER — Ambulatory Visit (HOSPITAL_BASED_OUTPATIENT_CLINIC_OR_DEPARTMENT_OTHER): Payer: Medicaid Other

## 2013-09-10 ENCOUNTER — Other Ambulatory Visit: Payer: Self-pay

## 2013-09-10 ENCOUNTER — Ambulatory Visit: Payer: Medicaid Other | Admitting: Nutrition

## 2013-09-10 VITALS — BP 124/77 | HR 81 | Temp 98.5°F | Resp 20

## 2013-09-10 DIAGNOSIS — Z5111 Encounter for antineoplastic chemotherapy: Secondary | ICD-10-CM

## 2013-09-10 DIAGNOSIS — C099 Malignant neoplasm of tonsil, unspecified: Secondary | ICD-10-CM

## 2013-09-10 DIAGNOSIS — Z510 Encounter for antineoplastic radiation therapy: Secondary | ICD-10-CM | POA: Diagnosis not present

## 2013-09-10 MED ORDER — SODIUM CHLORIDE 0.9 % IV SOLN
INTRAVENOUS | Status: DC
  Administered 2013-09-10: 11:00:00 via INTRAVENOUS

## 2013-09-10 MED ORDER — DEXAMETHASONE SODIUM PHOSPHATE 20 MG/5ML IJ SOLN
12.0000 mg | Freq: Once | INTRAMUSCULAR | Status: AC
  Administered 2013-09-10: 12 mg via INTRAVENOUS

## 2013-09-10 MED ORDER — SODIUM CHLORIDE 0.9 % IJ SOLN
10.0000 mL | INTRAMUSCULAR | Status: DC | PRN
  Administered 2013-09-10: 10 mL
  Filled 2013-09-10: qty 10

## 2013-09-10 MED ORDER — HEPARIN SOD (PORK) LOCK FLUSH 100 UNIT/ML IV SOLN
500.0000 [IU] | Freq: Once | INTRAVENOUS | Status: AC | PRN
  Administered 2013-09-10: 500 [IU]
  Filled 2013-09-10: qty 5

## 2013-09-10 MED ORDER — POTASSIUM CHLORIDE 2 MEQ/ML IV SOLN
Freq: Once | INTRAVENOUS | Status: AC
  Administered 2013-09-10: 11:00:00 via INTRAVENOUS
  Filled 2013-09-10: qty 10

## 2013-09-10 MED ORDER — PALONOSETRON HCL INJECTION 0.25 MG/5ML
0.2500 mg | Freq: Once | INTRAVENOUS | Status: AC
  Administered 2013-09-10: 0.25 mg via INTRAVENOUS

## 2013-09-10 MED ORDER — SODIUM CHLORIDE 0.9 % IV SOLN
100.0000 mg/m2 | Freq: Once | INTRAVENOUS | Status: AC
  Administered 2013-09-10: 280 mg via INTRAVENOUS
  Filled 2013-09-10: qty 280

## 2013-09-10 MED ORDER — SODIUM CHLORIDE 0.9 % IV SOLN
150.0000 mg | Freq: Once | INTRAVENOUS | Status: AC
  Administered 2013-09-10: 150 mg via INTRAVENOUS
  Filled 2013-09-10: qty 5

## 2013-09-10 NOTE — Patient Instructions (Signed)
[facility] Discharge Instructions for Patients Receiving Chemotherapy  Today you received the following chemotherapy agents Cisplatin.   To help prevent nausea and vomiting after your treatment, we encourage you to take your nausea medication Compazine 10 mg every 6 hours   If you develop nausea and vomiting that is not controlled by your nausea medication, call the clinic.   BELOW ARE SYMPTOMS THAT SHOULD BE REPORTED IMMEDIATELY:  *FEVER GREATER THAN 100.5 F  *CHILLS WITH OR WITHOUT FEVER  NAUSEA AND VOMITING THAT IS NOT CONTROLLED WITH YOUR NAUSEA MEDICATION  *UNUSUAL SHORTNESS OF BREATH  *UNUSUAL BRUISING OR BLEEDING  TENDERNESS IN MOUTH AND THROAT WITH OR WITHOUT PRESENCE OF ULCERS  *URINARY PROBLEMS  *BOWEL PROBLEMS  UNUSUAL RASH Items with * indicate a potential emergency and should be followed up as soon as possible.  Feel free to call the clinic you have any questions or concerns. The clinic phone number is [phone].

## 2013-09-10 NOTE — Progress Notes (Signed)
Patient reports continued nausea.  Dietary recall reveals patient able to eat soft foods such as grits, baked beans, chocolate pudding, and Ensure Plus.  Patient is flushing feeding tube with water daily but has not put any nutrition through his tube.  Weight loss continues with weight documented as 298.6 pounds on May 21.  Down from 333.8 pounds March 31.  Patient does have some thick mucus.  He denies other nutrition issues.  Nutrition diagnosis: Food and nutrition related knowledge deficit continues.  Estimated nutrition needs: 2600-2800 calories, 140-160 g protein, 2.8 L fluid.  Intervention: Patient educated to increase Ensure Plus to 6 cans daily, either by mouth or via feeding tube.  Patient's goal without oral intake will be 8 cans of Ensure Plus a day to provide 2800 calories, 104 g protein, and 2800 mL free water.  Patient was educated on tube feedings and advancement.  Patient encouraged to continue oral intake by mouth as tolerated but if unable to eat and drink by mouth, patient needs to begin using feeding tube.  Written instructions provided.  Teach back method used.  Tube feeding Goal: 2 cans of Ensure Plus 4 times a day with 120 mL free water before and after each bolus feeding +16 ounces of water by mouth daily.  Monitoring, evaluation, goals: Patient has been able to tolerate adequate oral intake.  Patient will increase Ensure Plus either by mouth or feeding tube to promote weight maintenance.  Once tube feeding tolerance established, will add protein powder for increased protein intake.  Next visit: Patient has contact information and will followup by phone as needed.

## 2013-09-10 NOTE — Progress Notes (Signed)
Weekly Management Note:  Site: Tonsil/bilateral neck Current Dose:  3000  cGy Projected Dose: 7000  cGy  Narrative: The patient is seen today for routine under treatment assessment. CBCT/MVCT images/port films were reviewed. The chart was reviewed.   He does report nausea today but is otherwise doing well. He is getting in most of his calories by mouth and is not really using his feeding tube. He is receiving more chemotherapy today. He denies throat discomfort.  Physical Examination:  Wt Readings from Last 3 Encounters:  09/05/13 298 lb 9.6 oz (135.444 kg)  09/03/13 303 lb (137.44 kg)  09/02/13 303 lb 8 oz (137.667 kg)   Temp Readings from Last 3 Encounters:  09/10/13 98.5 F (36.9 C) Oral  09/05/13 98.2 F (36.8 C) Oral  09/03/13 98.3 F (36.8 C) Oral   BP Readings from Last 3 Encounters:  09/10/13 124/77  09/05/13 123/82  09/03/13 122/81   Pulse Readings from Last 3 Encounters:  09/10/13 81  09/05/13 79  09/03/13 78     Weight:  . He is examined in the chemotherapy suite today. On inspection oral cavity there is no candidiasis.  Lab Results  Component Value Date   WBC 4.2 09/06/2013   HGB 12.0* 09/06/2013   HCT 36.9* 09/06/2013   MCV 90.0 09/06/2013   PLT 295 09/06/2013     Impression: Tolerating radiation therapy well.  Plan: Continue radiation therapy as planned.

## 2013-09-10 NOTE — Progress Notes (Signed)
Patient informed of IVF appt's at [facility] for Wednesday and Thursday @ 1 pm. Written directions to [facility] center given to patient as well as written appt times for pt to have for reference and reminders. Patient gave verbal understanding and states will make every attempt to make the appts but feels that d/t time being approx 3 hours after his scheduled radiation treatments, he states it is a long time for him to wait. Informed patient that this was the earliest available times the center had. Patient gave verbal understanding and knows to call should he have any questions or concerns.

## 2013-09-11 ENCOUNTER — Non-Acute Institutional Stay (HOSPITAL_COMMUNITY)
Admission: AD | Admit: 2013-09-11 | Discharge: 2013-09-11 | Disposition: A | Discharge status: Home / Self Care | Payer: Medicaid Other | Source: Ambulatory Visit | Attending: Hematology and Oncology | Admitting: Hematology and Oncology

## 2013-09-11 ENCOUNTER — Ambulatory Visit: Payer: Medicaid Other | Admitting: Radiation Oncology

## 2013-09-11 ENCOUNTER — Other Ambulatory Visit: Payer: Self-pay | Admitting: *Deleted

## 2013-09-11 ENCOUNTER — Ambulatory Visit
Admission: RE | Admit: 2013-09-11 | Discharge: 2013-09-11 | Disposition: A | Discharge status: Home / Self Care | Payer: Medicaid Other | Source: Ambulatory Visit | Attending: Radiation Oncology | Admitting: Radiation Oncology

## 2013-09-11 DIAGNOSIS — C099 Malignant neoplasm of tonsil, unspecified: Secondary | ICD-10-CM

## 2013-09-11 DIAGNOSIS — Z510 Encounter for antineoplastic radiation therapy: Secondary | ICD-10-CM | POA: Diagnosis not present

## 2013-09-11 MED ORDER — SODIUM CHLORIDE 0.9 % IV SOLN
INTRAVENOUS | Status: DC
  Administered 2013-09-11: 14:00:00 via INTRAVENOUS

## 2013-09-11 MED ORDER — HEPARIN SOD (PORK) LOCK FLUSH 100 UNIT/ML IV SOLN
500.0000 [IU] | Freq: Once | INTRAVENOUS | Status: AC
  Administered 2013-09-11: 500 [IU] via INTRAVENOUS
  Filled 2013-09-11: qty 5

## 2013-09-11 MED ORDER — PROMETHAZINE HCL 25 MG/ML IJ SOLN
25.0000 mg | Freq: Once | INTRAMUSCULAR | Status: AC
  Administered 2013-09-11: 25 mg via INTRAVENOUS
  Filled 2013-09-11: qty 1

## 2013-09-11 MED ORDER — SODIUM CHLORIDE 0.9 % IJ SOLN
10.0000 mL | Freq: Once | INTRAMUSCULAR | Status: AC
  Administered 2013-09-11: 10 mL via INTRAVENOUS

## 2013-09-11 NOTE — H&P (Signed)
Patient is here for IVf only

## 2013-09-11 NOTE — Procedures (Signed)
[facility]  Procedure Note  Mason [name] MNO:[id number] DOB: 02/17/1966 DOA: 09/11/2013   PCP: Natale Lay  Associated Diagnosis: Tonsillar cancer   Procedure Note: Infused 1 Liter NS per order   Condition During Procedure: Patient tolerated well. No complaints.    Condition at Discharge: Tolerated Well. No complaints. Patient in no apparent distress.Patient discharged home. Patient left day hospital with belongings ambulatory via.    Wendie Simmer, RN  [facility]

## 2013-09-11 NOTE — Discharge Instructions (Signed)
Cancer of the Tonsils Cancer of the tonsils occurs when cells on the outside of the tonsils become abnormal and start to grow out of control. This usually starts in very thin, flat cells that line the surface of your mouth (squamous cells). Cancer cells can spread and form a mass of cells called a tumor. The cancer may spread deeper into the tonsils, or it may spread to other areas of the body (metastasize). RISK FACTORS The exact cause of cancer of the tonsils is not known. However, some risk factors make this more likely:  Use of tobacco products, including cigarettes, pipes, cigars, smokeless (chewing) tobacco, and snuff. This is the number one risk factor of cancer of the tonsils.  Male gender.  Age of 12 years or older.  Frequent use of alcohol.  Human papillomavirus (HPV) infection.  Poor oral hygiene (not brushing or flossing your teeth regularly). SYMPTOMS  One tonsil that is larger than the other.  A sore throat (on just one side) that does not go away on its own or after receiving treatment.  A sore or lump on a tonsil that does not go away.  Difficulty swallowing or chewing.  Ear pain.  Bad breath.  Bleeding in your mouth.  A lump on your neck.  Difficulty when trying to open your mouth. DIAGNOSIS To diagnose cancer of the tonsils, your caregiver may perform the following exams:  A physical exam of your tonsils for a sore or lump. Your caregiver will also look at other parts of your mouth, throat, and neck. Your caregiver may use a mirror with a long handle or a thin, flexible tube with a tiny light and camera at the end (fiberscope) to see the back of your mouth.  Removal and exam of a small number of cells (biopsy) from your tonsils or a lump on your neck. The cells are checked for cancerous formations under a microscope.  Imaging exams, such as X-rays of your mouth and neck. The images can show if there is an abnormal mass. If you do have cancer, your  caregiver will stage your cancer. Staging provides an idea of how advanced your cancer is. The stage of your cancer depends on how much your cancer has grown and if it has metastasized. The meaning of the stage depends on the type of cancer. For cancer of the tonsils:  Stage I means the cancer is the size of a peanut or smaller. It has not metastasized.  Stage II means the cancer is larger than a peanut, but not larger than a walnut. It has not metastasized.  Stage III means the cancer has grown larger than a walnut. It may have spread to a lymph node or lymph gland on the same side of your neck as the cancer. (Lymph is a fluid that carries white blood cells all over your body. White blood cells fight infection.)  Stage IV means the cancer has spread to nearby areas. It may have spread heavily into your lymph glands. TREATMENT Treatment for tonsil cancer can vary. It will depend on the stage of your cancer and your overall health. Treatment options may include:  Radiation therapy. This uses waves of nuclear energy to kill cancer cells on your tonsils. It may be used for stage I or stage II cancers.  Surgery. This may be needed if the cancer has spread to your lymph nodes or if it has spread to other parts of your neck. A procedure may also be done to make  it easier for you to swallow and talk.  A combination of radiation, drugs that kill cancer cells (chemotherapy), and surgery. This may be used for stage III and stage IV cancers. SEEK MEDICAL CARE IF:  You have pain in your throat or in your ear.  Your throat is numb.  You notice changes in the way you speak.  You notice changes in the way you swallow.  You notice a lump on your neck. SEEK IMMEDIATE MEDICAL CARE IF:   You have pain that gets worse. This may be pain in your throat or your ear.  You have bleeding in your mouth.  Your throat or neck swells.  You have difficulty swallowing.  You have difficulty breathing.  You  have difficulty opening your mouth.  You have pain in your mouth.  You have a fever. Document Released: 07/20/2010 Document Revised: 06/27/2011 Document Reviewed: 07/20/2010 [facility] Patient Information 2014 [place].

## 2013-09-12 ENCOUNTER — Encounter (HOSPITAL_COMMUNITY): Payer: Self-pay

## 2013-09-12 ENCOUNTER — Encounter: Payer: Self-pay | Admitting: *Deleted

## 2013-09-12 ENCOUNTER — Ambulatory Visit: Admission: RE | Admit: 2013-09-12 | Payer: Medicaid Other | Source: Ambulatory Visit

## 2013-09-12 ENCOUNTER — Inpatient Hospital Stay (HOSPITAL_COMMUNITY)
Admission: AD | Admit: 2013-09-12 | Discharge: 2013-10-04 | DRG: 146 | Disposition: A | Discharge status: Home / Self Care | Payer: Medicaid Other | Source: Ambulatory Visit | Attending: Hematology and Oncology | Admitting: Hematology and Oncology

## 2013-09-12 ENCOUNTER — Ambulatory Visit (HOSPITAL_BASED_OUTPATIENT_CLINIC_OR_DEPARTMENT_OTHER): Payer: Medicaid Other

## 2013-09-12 ENCOUNTER — Other Ambulatory Visit: Payer: Self-pay | Admitting: *Deleted

## 2013-09-12 ENCOUNTER — Telehealth: Payer: Self-pay | Admitting: *Deleted

## 2013-09-12 VITALS — BP 156/89 | HR 77 | Temp 98.7°F | Resp 18

## 2013-09-12 DIAGNOSIS — R1115 Cyclical vomiting syndrome unrelated to migraine: Secondary | ICD-10-CM | POA: Diagnosis present

## 2013-09-12 DIAGNOSIS — C770 Secondary and unspecified malignant neoplasm of lymph nodes of head, face and neck: Secondary | ICD-10-CM | POA: Diagnosis present

## 2013-09-12 DIAGNOSIS — T380X5A Adverse effect of glucocorticoids and synthetic analogues, initial encounter: Secondary | ICD-10-CM | POA: Diagnosis present

## 2013-09-12 DIAGNOSIS — Z6832 Body mass index (BMI) 32.0-32.9, adult: Secondary | ICD-10-CM | POA: Diagnosis not present

## 2013-09-12 DIAGNOSIS — C099 Malignant neoplasm of tonsil, unspecified: Secondary | ICD-10-CM

## 2013-09-12 DIAGNOSIS — N179 Acute kidney failure, unspecified: Secondary | ICD-10-CM | POA: Diagnosis present

## 2013-09-12 DIAGNOSIS — T451X5A Adverse effect of antineoplastic and immunosuppressive drugs, initial encounter: Secondary | ICD-10-CM | POA: Diagnosis present

## 2013-09-12 DIAGNOSIS — Z87891 Personal history of nicotine dependence: Secondary | ICD-10-CM | POA: Diagnosis not present

## 2013-09-12 DIAGNOSIS — B977 Papillomavirus as the cause of diseases classified elsewhere: Secondary | ICD-10-CM | POA: Diagnosis present

## 2013-09-12 DIAGNOSIS — M7989 Other specified soft tissue disorders: Secondary | ICD-10-CM | POA: Diagnosis present

## 2013-09-12 DIAGNOSIS — R112 Nausea with vomiting, unspecified: Secondary | ICD-10-CM | POA: Diagnosis present

## 2013-09-12 DIAGNOSIS — R609 Edema, unspecified: Secondary | ICD-10-CM | POA: Diagnosis present

## 2013-09-12 DIAGNOSIS — R071 Chest pain on breathing: Secondary | ICD-10-CM | POA: Diagnosis present

## 2013-09-12 DIAGNOSIS — Z79899 Other long term (current) drug therapy: Secondary | ICD-10-CM | POA: Diagnosis not present

## 2013-09-12 DIAGNOSIS — F411 Generalized anxiety disorder: Secondary | ICD-10-CM | POA: Diagnosis present

## 2013-09-12 DIAGNOSIS — D61818 Other pancytopenia: Secondary | ICD-10-CM | POA: Diagnosis present

## 2013-09-12 DIAGNOSIS — E876 Hypokalemia: Secondary | ICD-10-CM | POA: Diagnosis present

## 2013-09-12 DIAGNOSIS — I10 Essential (primary) hypertension: Secondary | ICD-10-CM | POA: Diagnosis present

## 2013-09-12 DIAGNOSIS — R634 Abnormal weight loss: Secondary | ICD-10-CM | POA: Diagnosis present

## 2013-09-12 DIAGNOSIS — R1110 Vomiting, unspecified: Secondary | ICD-10-CM

## 2013-09-12 DIAGNOSIS — Z510 Encounter for antineoplastic radiation therapy: Secondary | ICD-10-CM | POA: Diagnosis present

## 2013-09-12 DIAGNOSIS — K5900 Constipation, unspecified: Secondary | ICD-10-CM | POA: Diagnosis present

## 2013-09-12 DIAGNOSIS — E860 Dehydration: Secondary | ICD-10-CM | POA: Diagnosis present

## 2013-09-12 DIAGNOSIS — K0530 Chronic periodontitis, unspecified: Secondary | ICD-10-CM | POA: Diagnosis present

## 2013-09-12 DIAGNOSIS — E43 Unspecified severe protein-calorie malnutrition: Secondary | ICD-10-CM | POA: Diagnosis present

## 2013-09-12 DIAGNOSIS — D638 Anemia in other chronic diseases classified elsewhere: Secondary | ICD-10-CM | POA: Diagnosis present

## 2013-09-12 DIAGNOSIS — E162 Hypoglycemia, unspecified: Secondary | ICD-10-CM | POA: Diagnosis present

## 2013-09-12 DIAGNOSIS — Z931 Gastrostomy status: Secondary | ICD-10-CM | POA: Diagnosis not present

## 2013-09-12 DIAGNOSIS — R0789 Other chest pain: Secondary | ICD-10-CM

## 2013-09-12 HISTORY — DX: Constipation, unspecified: K59.00

## 2013-09-12 HISTORY — DX: Unspecified protein-calorie malnutrition: E46

## 2013-09-12 HISTORY — DX: Hypoglycemia, unspecified: E16.2

## 2013-09-12 HISTORY — DX: Other specified abnormal findings of blood chemistry: R79.89

## 2013-09-12 HISTORY — DX: Fever, unspecified: R50.9

## 2013-09-12 HISTORY — DX: Nausea with vomiting, unspecified: R11.2

## 2013-09-12 HISTORY — DX: Localized edema: R60.0

## 2013-09-12 HISTORY — DX: Acute kidney failure, unspecified: N17.9

## 2013-09-12 HISTORY — DX: Anemia, unspecified: D64.9

## 2013-09-12 HISTORY — DX: Other diseases of pharynx: J39.2

## 2013-09-12 HISTORY — DX: Hypokalemia: E87.6

## 2013-09-12 HISTORY — DX: Abnormal results of liver function studies: R94.5

## 2013-09-12 LAB — GLUCOSE, CAPILLARY: Glucose-Capillary: 97 mg/dL (ref 70–99)

## 2013-09-12 MED ORDER — DOCUSATE SODIUM 100 MG PO CAPS
100.0000 mg | ORAL_CAPSULE | Freq: Two times a day (BID) | ORAL | Status: DC | PRN
  Filled 2013-09-12: qty 1

## 2013-09-12 MED ORDER — ACETAMINOPHEN 325 MG PO TABS: 650.0000 mg | ORAL_TABLET | ORAL | Status: DC | PRN

## 2013-09-12 MED ORDER — MAGIC MOUTHWASH W/LIDOCAINE
10.0000 mL | Freq: Four times a day (QID) | ORAL | Status: DC
  Administered 2013-09-12 (×2): 10 mL via ORAL
  Administered 2013-09-14: 5 mL via ORAL
  Administered 2013-09-17 – 2013-10-01 (×7): 10 mL via ORAL
  Filled 2013-09-12 (×94): qty 10

## 2013-09-12 MED ORDER — CHLORHEXIDINE GLUCONATE 0.12 % MT SOLN
15.0000 mL | Freq: Two times a day (BID) | OROMUCOSAL | Status: DC
  Administered 2013-09-12 – 2013-09-16 (×8): 15 mL via OROMUCOSAL
  Filled 2013-09-12 (×10): qty 15

## 2013-09-12 MED ORDER — VITAL AF 1.2 CAL PO LIQD: 1000.0000 mL | ORAL | Status: DC

## 2013-09-12 MED ORDER — SODIUM CHLORIDE 0.9 % IV SOLN
INTRAVENOUS | Status: AC
  Administered 2013-09-12: 10:00:00 via INTRAVENOUS

## 2013-09-12 MED ORDER — SUCRALFATE 1 G PO TABS
1.0000 g | ORAL_TABLET | Freq: Four times a day (QID) | ORAL | Status: DC | PRN
  Filled 2013-09-12: qty 1

## 2013-09-12 MED ORDER — PROCHLORPERAZINE MALEATE 10 MG PO TABS: 10.0000 mg | ORAL_TABLET | Freq: Four times a day (QID) | ORAL | Status: DC | PRN

## 2013-09-12 MED ORDER — GUAIFENESIN ER 600 MG PO TB12
600.0000 mg | ORAL_TABLET | Freq: Two times a day (BID) | ORAL | Status: DC
  Administered 2013-09-12 – 2013-09-16 (×8): 600 mg via ORAL
  Filled 2013-09-12 (×9): qty 1

## 2013-09-12 MED ORDER — ONDANSETRON HCL 8 MG PO TABS: 8.0000 mg | ORAL_TABLET | Freq: Three times a day (TID) | ORAL | Status: DC | PRN

## 2013-09-12 MED ORDER — OXYCODONE HCL 5 MG PO TABS
10.0000 mg | ORAL_TABLET | ORAL | Status: DC | PRN
  Administered 2013-09-20 – 2013-09-30 (×23): 10 mg via ORAL
  Filled 2013-09-12 (×24): qty 2

## 2013-09-12 MED ORDER — SODIUM FLUORIDE 1.1 % DT GEL: 1.0000 [drp] | Freq: Every day | DENTAL | Status: DC

## 2013-09-12 MED ORDER — PROMETHAZINE HCL 25 MG/ML IJ SOLN
25.0000 mg | Freq: Once | INTRAMUSCULAR | Status: AC
  Administered 2013-09-12: 25 mg via INTRAVENOUS
  Filled 2013-09-12: qty 1

## 2013-09-12 MED ORDER — OXYCODONE HCL 5 MG PO TABS: 5.0000 mg | ORAL_TABLET | ORAL | Status: DC | PRN

## 2013-09-12 MED ORDER — ONDANSETRON 8 MG/NS 50 ML IVPB
8.0000 mg | Freq: Three times a day (TID) | INTRAVENOUS | Status: DC | PRN
  Administered 2013-09-17 – 2013-09-18 (×2): 8 mg via INTRAVENOUS
  Filled 2013-09-12 (×5): qty 8

## 2013-09-12 MED ORDER — HYDROMORPHONE HCL PF 1 MG/ML IJ SOLN
1.0000 mg | INTRAMUSCULAR | Status: DC | PRN
  Administered 2013-09-12 – 2013-10-03 (×120): 1 mg via INTRAVENOUS
  Filled 2013-09-12 (×122): qty 1

## 2013-09-12 MED ORDER — SODIUM CHLORIDE 0.9 % IV SOLN
INTRAVENOUS | Status: DC
  Administered 2013-09-12 – 2013-09-13 (×6): via INTRAVENOUS
  Administered 2013-09-14 – 2013-09-15 (×3): 1000 mL via INTRAVENOUS
  Administered 2013-09-15 – 2013-09-16 (×4): via INTRAVENOUS
  Administered 2013-09-16: 1000 mL via INTRAVENOUS
  Administered 2013-09-16 – 2013-09-20 (×12): via INTRAVENOUS
  Administered 2013-09-20: 1000 mL via INTRAVENOUS
  Administered 2013-09-20 – 2013-09-21 (×2): via INTRAVENOUS
  Administered 2013-09-21: 1000 mL via INTRAVENOUS
  Administered 2013-09-21: 10:00:00 via INTRAVENOUS

## 2013-09-12 MED ORDER — VITAL AF 1.2 CAL PO LIQD
1000.0000 mL | ORAL | Status: DC
  Filled 2013-09-12: qty 1000

## 2013-09-12 MED ORDER — ONDANSETRON HCL 4 MG/2ML IJ SOLN
4.0000 mg | Freq: Three times a day (TID) | INTRAMUSCULAR | Status: DC | PRN
  Administered 2013-09-14 – 2013-10-03 (×26): 4 mg via INTRAVENOUS
  Filled 2013-09-12 (×27): qty 2

## 2013-09-12 MED ORDER — PROMETHAZINE HCL 25 MG/ML IJ SOLN
25.0000 mg | Freq: Four times a day (QID) | INTRAMUSCULAR | Status: AC
  Administered 2013-09-12 – 2013-09-16 (×17): 25 mg via INTRAVENOUS
  Filled 2013-09-12 (×28): qty 1

## 2013-09-12 MED ORDER — LIDOCAINE-PRILOCAINE 2.5-2.5 % EX CREA
1.0000 "application " | TOPICAL_CREAM | CUTANEOUS | Status: DC | PRN
  Filled 2013-09-12: qty 5

## 2013-09-12 MED ORDER — ENOXAPARIN SODIUM 40 MG/0.4ML ~~LOC~~ SOLN
40.0000 mg | SUBCUTANEOUS | Status: DC
  Administered 2013-09-16 – 2013-10-03 (×18): 40 mg via SUBCUTANEOUS
  Filled 2013-09-12 (×23): qty 0.4

## 2013-09-12 MED ORDER — SENNOSIDES-DOCUSATE SODIUM 8.6-50 MG PO TABS: 1.0000 | ORAL_TABLET | Freq: Two times a day (BID) | ORAL | Status: DC | PRN

## 2013-09-12 MED ORDER — ONDANSETRON HCL 4 MG PO TABS
4.0000 mg | ORAL_TABLET | Freq: Three times a day (TID) | ORAL | Status: DC | PRN
  Administered 2013-09-26 – 2013-09-29 (×4): 8 mg via ORAL
  Filled 2013-09-12 (×3): qty 1
  Filled 2013-09-12 (×4): qty 2

## 2013-09-12 MED ORDER — ONDANSETRON 8 MG PO TBDP
4.0000 mg | ORAL_TABLET | Freq: Three times a day (TID) | ORAL | Status: DC | PRN
  Filled 2013-09-12: qty 1

## 2013-09-12 NOTE — Telephone Encounter (Signed)
   Provider input needed: Uncontrolled N/V   Reason for call: Uncontrolled N/V  Gastrointestinal: positive for dyspepsia, nausea and vomiting Also reports significant weakness and lightheaded when up   ALLERGIES:  is allergic to pollen extract.  Patient last received chemotherapy/ treatment on 09/10/13-CDDP with RT  Patient was last seen in the office on 09/05/13   Next appt is 09/13/13  Is patient having fevers greater than 100.5?  no   Is patient having uncontrolled pain, or new pain? no   Is patient having new back pain that changes with position (worsens or eases when laying down?)  no   Is patient able to eat and drink? no , everything comes up, even when he puts water/Ensure in feeding tube. Reports no food intake in 2 days.  Is patient able to pass stool without difficulty?   yes     Is patient having uncontrolled nausea?  yes, vomited all night despite taking Zofran and Compazine as ordered. Now just having dry heaves. He had last BM yesterday and is passing gas with no abdominal pain.    Fiance' calls 09/12/2013 with complaint of  Gastrointestinal: positive for dyspepsia, nausea, vomiting and despite all po's ordered for nausea   Summary Based on the above information advised caregiver to await return call. Will most likely need IV fluids and antiemetics today.   Tania Ade  09/12/2013, 9:20 AM   Background Info  JARIOUS LYON   DOB: [birth year]   MR#: [id number]   CSN#   [id number] 09/12/2013

## 2013-09-12 NOTE — Progress Notes (Signed)
Ordered adult enteral protocol.  Agree with dietetic intern's note and interventions.   Mikey College MS, [place], [place] Pager [phone] After Hours Pager

## 2013-09-12 NOTE — Progress Notes (Signed)
To provide support, encouragement and care continuity, met with patient during fluid infusion and then later after his admission to [facility].  Called his fiancee to inform her of his pending admission and later with his room number.  Rick Diehl, RN, BSN, CHPN Head & Neck Oncology Navigator [phone]  

## 2013-09-12 NOTE — H&P (Signed)
[facility] ADMISSION NOTE  Patient Care Team: Lance Bosch, NP as PCP - General (Internal Medicine) [name] Patient (General Practice) Brooks Sailors, RN as Registered Nurse (Oncology) [name], MD as Referring Physician (Otolaryngology) [name], MD as Consulting Physician (Hematology and Oncology)  CHIEF COMPLAINTS/PURPOSE OF ADMISSION:  Uncontrolled nausea and vomiting with signs of dehydration  HISTORY OF PRESENTING ILLNESS:  Mason [name] 48 y.o. male is here because of uncontrolled nausea and vomiting. 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 Summary of oncologic history is as follows Oncology History   Tonsillar cancer, HPV positive   Primary site: Pharynx - Oropharynx (Right)   Staging method: AJCC 7th Edition   Clinical: Stage IVA (T2, N2b, M0) signed by [name], MD on 08/19/2013  1:24 PM   Summary: Stage IVA (T2, N2b, M0)       Tonsillar cancer   07/09/2013 Procedure 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 FNA of right level III lymph node was inconclusive for cancer   07/25/2013 Imaging PET scan showed locally advanced disease with abnormal lymphadenopathy in the right axilla   08/06/2013 Procedure CT-guided biopsy of the lymphadenopathy was negative for malignancy   08/15/2013 Surgery Patient has placement of port and feeding tube   08/19/2013 -  Chemotherapy Patient received chemotherapy with cisplatin   08/19/2013 -  Radiation Therapy Patient received radiation treatment   08/27/2013 - 08/30/2013 Hospital Admission The patient was admitted to the hospital for uncontrolled nausea vomiting and dehydration.   The patient received cycle 2 chemotherapy on 09/10/2013. Due to your recent admission for the same reason, he was scheduled to receive intravenous fluids and anti-emetics on a daily basis. Starting yesterday, he had recurrent nausea and vomiting and  inability to tolerate by mouth intake and enteral feedings. He feels weak and overall feeling dizzy. Previously, he was admitted because of problems his feeding tube. This time, he felt that the nausea was due to significant gagging from production of thick mucus in his throat. He denies any pain. Denies any headache of focal neurological deficit. Denies any signs and symptoms of infection. He denies any recent fever, chills, night sweats or abnormal weight loss He denies constipation. He has lost 10 pounds of weight recently.  MEDICAL HISTORY:  Past Medical History  Diagnosis Date  . 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 Knee pain, chronic   . Anxiety     mild new dx  . Arthritis     knees,hips  . Concussion     Hx: in high school x 2  . Complication of anesthesia     Pt stated " my oxygen level was slow in rising."  . Hypertension   . PEG (percutaneous endoscopic gastrostomy) status     SURGICAL HISTORY: Past Surgical History  Procedure Laterality Date  . Multiple extractions with alveoloplasty N/A 08/01/2013    Procedure: Extraction of tooth #'s 1,15,17,31, 32 with alveoloplasty, mandibular left torus reduction, and gross debridement of remaining teeth.;  Surgeon: [name], DDS;  Location: [facility];  Service: Oral Surgery;  Laterality: N/A;  . Laparoscopic gastrostomy N/A 08/15/2013    Procedure: LAPAROSCOPIC GASTROSTOMY TUBE PLACEMENT;  Surgeon: [name], MD;  Location: [facility];  Service: General;  Laterality: N/A;  . Portacath placement Left 08/15/2013    Procedure: INSERTION PORT-A-CATH;  Surgeon: [name], MD;  Location: [facility];  Service:  General;  Laterality: Left;    SOCIAL HISTORY: History   Social History  . Marital Status: Single    Spouse Name: N/A    Number of Children: 1  . Years of Education: N/A   Occupational History  . Not on file.   Social History Main Topics  . Smoking status: Former Smoker -- 1.00 packs/day for 20  years    Types: Cigarettes    Quit date: 07/16/2003  . Smokeless tobacco: Never Used  . Alcohol Use: 7.2 oz/week    12 Cans of beer per week     Comment: social [place], beer  "2 weekends out of the month"  . Drug Use: Yes    Special: Marijuana  . Sexual Activity: No   Other Topics Concern  . Not on file   Social History Narrative  . No narrative on file    FAMILY HISTORY: Family History  Problem Relation Age of Onset  . CVA    . Diabetes    . Heart attack    . Arthritis Father   . Arthritis Mother     ALLERGIES:  is allergic to pollen extract.  MEDICATIONS:  Current Facility-Administered Medications  Medication Dose Route Frequency Provider Last Rate Last Dose  . 0.9 %  sodium chloride infusion   Intravenous Continuous [name], MD 200 mL/hr at 09/12/13 1421    . acetaminophen (TYLENOL) tablet 650 mg  650 mg Oral Q4H PRN [name], MD      . chlorhexidine (PERIDEX) 0.12 % solution 15 mL  15 mL Mouth/Throat BID Rudolpho Claxton, MD      . docusate sodium (COLACE) capsule 100 mg  100 mg Oral BID PRN [name], MD      . enoxaparin (LOVENOX) injection 40 mg  40 mg Subcutaneous Q24H Jaedyn Lard, MD      . HYDROmorphone (DILAUDID) injection 1 mg  1 mg Intravenous Q2H PRN [name], MD   1 mg at 09/12/13 1420  . lidocaine-prilocaine (EMLA) cream 1 application  1 application Topical PRN [name], MD      . magic mouthwash w/lidocaine  10 mL Oral QID [name], MD      . ondansetron (ZOFRAN) tablet 4-8 mg  4-8 mg Oral Q8H PRN [name], MD       Or  . ondansetron (ZOFRAN-ODT) disintegrating tablet 4-8 mg  4-8 mg Oral Q8H PRN [name], MD       Or  . ondansetron (ZOFRAN) injection 4 mg  4 mg Intravenous Q8H PRN [name], MD       Or  . ondansetron (ZOFRAN) 8 mg/NS 50 ml IVPB  8 mg Intravenous Q8H PRN Vyncent Overby, MD      . ondansetron (ZOFRAN) tablet 8 mg  8 mg Oral Q8H PRN [name], MD      . oxyCODONE (Oxy IR/ROXICODONE) immediate release tablet 10 mg  10 mg Oral  Q4H PRN [name], MD      . prochlorperazine (COMPAZINE) tablet 10 mg  10 mg Oral Q6H PRN [name], MD      . senna-docusate (Senokot-S) tablet 1 tablet  1 tablet Oral BID PRN [name], MD      . sodium fluoride (FLUORISHIELD) 1.1 % dental gel 1 application  1 drop dental QHS Johann Gascoigne, MD      . sucralfate (CARAFATE) tablet 1 g  1 g Oral QID PRN [name], MD        REVIEW OF  SYSTEMS:   Constitutional: Denies fevers, chills or abnormal night sweats Eyes: Denies blurriness of vision, double vision or watery eyes Ears, nose, mouth, throat, and face: Denies mucositis or sore throat Cardiovascular: Denies palpitation, chest discomfort or lower extremity swelling Skin: Denies abnormal skin rashes Lymphatics: Denies new lymphadenopathy or easy bruising Neurological:Denies numbness, tingling or new weaknesses Behavioral/Psych: Mood is stable, no new changes  All other systems were reviewed with the patient and are negative.  PHYSICAL EXAMINATION: ECOG PERFORMANCE STATUS: 3 - Symptomatic, >50% confined to bed T: 37 BP 161/95 HR 79 RR 20 GENERAL:alert, in moderate distress and appears very uncomfortable. SKIN: skin color, texture, turgor are normal, no rashes or significant lesions EYES: normal, conjunctiva are pink and non-injected, sclera clear OROPHARYNX:no exudate, no erythema and lips, buccal mucosa, and tongue normal . No mucositis or thrush NECK: supple, thyroid normal size, non-tender, without nodularity LYMPH:  no palpable lymphadenopathy in the cervical, axillary or inguinal LUNGS: clear to auscultation and percussion with normal breathing effort HEART: regular rate & rhythm and no murmurs and no lower extremity edema ABDOMEN:abdomen soft, non-tender and normal bowel sounds. Feeding to site looks okay Musculoskeletal:no cyanosis of digits and no clubbing  PSYCH: alert & oriented x 3 with fluent speech NEURO: no focal motor/sensory deficits  LABORATORY DATA:  I have  reviewed the data as listed Lab Results  Component Value Date   WBC 4.2 09/06/2013   HGB 12.0* 09/06/2013   HCT 36.9* 09/06/2013   MCV 90.0 09/06/2013   PLT 295 09/06/2013    Recent Labs  08/27/13 1227 08/28/13 0515 08/29/13 0545 09/06/13 1438  NA 133* 133* 134* 137  K 4.0 4.1 4.3 4.7  CL 91* 95* 98  --   CO2 30 27 25 29   GLUCOSE 107* 107* 92 77  BUN 26* 22 18 10.6  CREATININE 1.56* 1.42* 1.25 1.2  CALCIUM 9.3 9.0 8.7 9.5  GFRNONAA 52* 58* 68*  --   GFRAA 60* 67* 78*  --   PROT 7.4  --  6.5 7.2  ALBUMIN 3.8  --  3.1* 3.4*  AST 34  --  28 31  ALT 68*  --  52 46  ALKPHOS 79  --  76 83  BILITOT 0.3  --  0.4 0.34   ASSESSMENT & PLAN:  #1 locally advanced tonsil cancer The patient is not able to tolerate outpatient management. He can continue radiation therapy while hospitalized. I recommend admission to the hospital for intractable nausea, vomiting and dehydration. #2 uncontrolled nausea and vomiting with gagging He has the most severe case of nausea & vomiting that I have ever seen in my entire career. I recommend continuous IV fluids and IV anti-emetics as round the lock He has worked well in the past. I will also prescribe Mucinex for production of mucous that contributed to his gagging. #3 severe dehydration with recent renal failure I will recommend continuous IV fluid resuscitation #4 weight loss with malnutrition I will consult nutrition service. I will start him on liquid diet for now. If he continues to have for intractable nausea and vomiting, he might need to be started on total parenteral nutrition. #5 DVT prophylaxis Will start him on Lovenox #6 CODE STATUS Full code  Orders Placed This Encounter  Procedures  . Comprehensive metabolic panel    Standing Status: Standing     Number of Occurrences: 1     Standing Expiration Date:   . CBC WITH DIFFERENTIAL    Standing Status: Standing  Number of Occurrences: 1     Standing Expiration Date:   . Diet  full liquid    Standing Status: Standing     Number of Occurrences: 1     Standing Expiration Date:     Order Specific Question:  Room service appropriate?    Answer:  Yes    Order Specific Question:  Fluid consistency:    Answer:  Thin  . Vital signs    Standing Status: Standing     Number of Occurrences: 1     Standing Expiration Date:   . Notify physician    Notify physician for pulse less than 55 or greater than 120, respiratory rate less than 12 or greater than 25, temperature greater than 100.5 F, urinary output less than 30 mL/kg/hr for four hours, systolic BP less than 90 or greater than 254, diastolic BP less than 60 or greater than 100.    Standing Status: Standing     Number of Occurrences: 20     Standing Expiration Date:   . Oncology -  Do Not continue treatment plan    Standing Status: Standing     Number of Occurrences: 1     Standing Expiration Date:   . Full code    Standing Status: Standing     Number of Occurrences: 1     Standing Expiration Date:   . Consult to dietitian    Standing Status: Standing     Number of Occurrences: 1     Standing Expiration Date:     Order Specific Question:  Reason for consult?    Answer:  Enteral/tube feeding initiation and management  . Admit to Inpatient (patient's expected length of stay will be greater than 2 midnights)    Standing Status: Standing     Number of Occurrences: 1     Standing Expiration Date:     Order Specific Question:  Hospital Area    Answer:  [facility] [[id number]]    Order Specific Question:  Diagnosis    Answer:  Nausea & vomiting [[id number]]    Order Specific Question:  Level of Care    Answer:  Med-Surg [16]    Order Specific Question:  Admitting Physician    AnswerAlvy Bimler, DV [[id number]]    Order Specific Question:  Estimated length of stay    Answer:  3 - 4 days    Order Specific Question:  Certification:    Answer:  I certify this patient will need inpatient services for at  least 2 midnights    Order Specific Question:  Attending Physician    AnswerAlvy Bimler, XT [[id number]]    Order Specific Question:  PT Class (Do Not Modify)    Answer:  Inpatient [101]    Order Specific Question:  PT Acc Code (Do Not Modify)    Answer:  Private [1]    All questions were answered. The patient knows to call the clinic with any problems, questions or concerns.    [name], MD 09/12/2013 2:27 PM

## 2013-09-12 NOTE — Telephone Encounter (Signed)
Infusion nurse called with new opening this morning in tx area. [facility] and requested she bring him in now for IV fluids/antiemetics. Will make Dr. Alvy Bimler aware. Per Dr. Edrick Kins him received 2 liters IVF today and can have IV Phenergan. Tx nurse to call her when 2nd liter is started so she can assess him.

## 2013-09-12 NOTE — Progress Notes (Signed)
Patient had 3 more episodes of vomiting. Dr. Alvy Bimler chairside. Patient to be admitted to hospital per Dr. Alvy Bimler. Georgeanna Lea, patients fiance notified of above plan.  Patient transported via wheelchair to [facility]. Report given to [employer], Therapist, sports

## 2013-09-12 NOTE — Progress Notes (Signed)
INITIAL NUTRITION ASSESSMENT  DOCUMENTATION CODES Per approved criteria  -Severe malnutrition in the context of chronic illness -Obesity Unspecified   Pt meets criteria for severe MALNUTRITION in the context of chronic illness as evidenced by weight loss of 9% x 1 month and energy intake </=75% of minimum estimated needs >/= 1 month.   INTERVENTION:  Initiate Vital 1.2 Cal via PEG @ 20 ml/ hr increasing 10 ml q 4 hours to goal rate of 90 ml/hr.  TF regimen provides 2592 kcal (99% minimum estimated needs) and 162 grams of protein (100% minimum estimated needs) and 1752 ml free water  Recommend 150 ml water flushes 6 times a day  Continue TF regimen until pt able to meet >/=50% of estimated nutrition needs by mouth   RD to continue to follow nutrition care plan  NUTRITION DIAGNOSIS: Inadequate oral intake related to persistent nausea/vomiting as evidenced by pt report of frequent emesis after any intake.   Goal: Pt to meet >/=90% of estimated nutrition needs   Monitor:  TF tolerance, weight trend, labs   Reason for Assessment: Consult for Tube Feeding initiation/ management   48 y.o. male  Admitting Dx: Nausea and Vomiting   ASSESSMENT: Patient is a 48 y.o. male here because of uncontrolled nausea and vomiting. The patient had extensive history of squamous cell carcinoma of the right tonsil. Pt is receiving IVf.   - Pt had PEG placed on 08/15/13 - Pt received cycle 2 chemotherapy on 09/10/2013 - Pt followed by [facility] has lost 30 lbs x 1 month (9.1%), which is severe for time frame - Pt reports poor PO intake x 1 month; dietary intake includes things like pudding, grits, jello, Ensure  - Pt reports using his PEG tube for the first time last night and he put 1 Ensure Plus and water in the tube. Pt reports that he vomited after using the tube  - Pt still very nauseous at time of assessment   - Recommend using PEG for sole source of nutrition until pt  able to meet >/=50% of minimum estimated needs by PO intake  No muscle or subcutaneous fat depletion noticed.   K, Mg WNL  Height: Ht Readings from Last 1 Encounters:  09/12/13 6\' 7"  (2.007 m)    Weight: Wt Readings from Last 1 Encounters:  09/12/13 298 lb (135.172 kg)    Ideal Body Weight: 100 kg   % Ideal Body Weight: 135%  Wt Readings from Last 10 Encounters:  09/12/13 298 lb (135.172 kg)  09/05/13 298 lb 9.6 oz (135.444 kg)  09/03/13 303 lb (137.44 kg)  09/02/13 303 lb 8 oz (137.667 kg)  08/30/13 313 lb 0.9 oz (142 kg)  08/27/13 306 lb 12.8 oz (139.164 kg)  08/26/13 308 lb (139.708 kg)  08/19/13 318 lb 9.6 oz (144.516 kg)  08/15/13 328 lb (148.78 kg)  08/15/13 328 lb (148.78 kg)    Usual Body Weight: 318 lbs   % Usual Body Weight: 94%  BMI:  Body mass index is 33.56 kg/(m^2). Obesity Class I  Estimated Nutritional Needs: Kcal: 2600 - 2800 Protein: 140 - 160 grams Fluid: 2.8 L fluid  Skin: chest incision, abdomen incision   Diet Order: Full Liquid  EDUCATION NEEDS: -No education needs identified at this time  No intake or output data in the 24 hours ending 09/12/13 1430  Last BM: 5/27   Labs:   Recent Labs Lab 09/06/13 1438  NA 137  K 4.7  CO2 29  BUN 10.6  CREATININE 1.2  CALCIUM 9.5  MG 2.3  GLUCOSE 77    CBG (last 3)  No results found for this basename: GLUCAP,  in the last 72 hours  Scheduled Meds: . chlorhexidine  15 mL Mouth/Throat BID  . enoxaparin (LOVENOX) injection  40 mg Subcutaneous Q24H  . guaiFENesin  600 mg Oral BID  . magic mouthwash w/lidocaine  10 mL Oral QID  . promethazine  25 mg Intravenous Q6H  . sodium fluoride  1 drop dental QHS    Continuous Infusions: . sodium chloride 200 mL/hr at 09/12/13 1421    Past Medical History  Diagnosis Date  . 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 Knee pain, chronic   . Anxiety     mild new dx  . Arthritis     knees,hips  . Concussion     Hx: in high  school x 2  . Complication of anesthesia     Pt stated " my oxygen level was slow in rising."  . Hypertension   . PEG (percutaneous endoscopic gastrostomy) status     Past Surgical History  Procedure Laterality Date  . Multiple extractions with alveoloplasty N/A 08/01/2013    Procedure: Extraction of tooth #'s 1,15,17,31, 32 with alveoloplasty, mandibular left torus reduction, and gross debridement of remaining teeth.;  Surgeon: [name], DDS;  Location: [facility];  Service: Oral Surgery;  Laterality: N/A;  . Laparoscopic gastrostomy N/A 08/15/2013    Procedure: LAPAROSCOPIC GASTROSTOMY TUBE PLACEMENT;  Surgeon: [name], MD;  Location: [facility];  Service: General;  Laterality: N/A;  . Portacath placement Left 08/15/2013    Procedure: INSERTION PORT-A-CATH;  Surgeon: [name], MD;  Location: [facility];  Service: General;  Laterality: Left;    Carrolyn Leigh, BS Dietetic Intern Pager: [phone]

## 2013-09-12 NOTE — Patient Instructions (Signed)
Dehydration, Adult Dehydration is when you lose more fluids from the body than you take in. Vital organs like the kidneys, brain, and heart cannot function without a proper amount of fluids and salt. Any loss of fluids from the body can cause dehydration.  CAUSES   Vomiting.  Diarrhea.  Excessive sweating.  Excessive urine output.  Fever. SYMPTOMS  Mild dehydration  Thirst.  Dry lips.  Slightly dry mouth. Moderate dehydration  Very dry mouth.  Sunken eyes.  Skin does not bounce back quickly when lightly pinched and released.  Dark urine and decreased urine production.  Decreased tear production.  Headache. Severe dehydration  Very dry mouth.  Extreme thirst.  Rapid, weak pulse (more than 100 beats per minute at rest).  Cold hands and feet.  Not able to sweat in spite of heat and temperature.  Rapid breathing.  Blue lips.  Confusion and lethargy.  Difficulty being awakened.  Minimal urine production.  No tears. DIAGNOSIS  Your caregiver will diagnose dehydration based on your symptoms and your exam. Blood and urine tests will help confirm the diagnosis. The diagnostic evaluation should also identify the cause of dehydration. TREATMENT  Treatment of mild or moderate dehydration can often be done at home by increasing the amount of fluids that you drink. It is best to drink small amounts of fluid more often. Drinking too much at one time can make vomiting worse. Refer to the home care instructions below. Severe dehydration needs to be treated at the hospital where you will probably be given intravenous (IV) fluids that contain water and electrolytes. HOME CARE INSTRUCTIONS   Ask your caregiver about specific rehydration instructions.  Drink enough fluids to keep your urine clear or pale yellow.  Drink small amounts frequently if you have nausea and vomiting.  Eat as you normally do.  Avoid:  Foods or drinks high in sugar.  Carbonated  drinks.  Juice.  Extremely hot or cold fluids.  Drinks with caffeine.  Fatty, greasy foods.  Alcohol.  Tobacco.  Overeating.  Gelatin desserts.  Wash your hands well to avoid spreading bacteria and viruses.  Only take over-the-counter or prescription medicines for pain, discomfort, or fever as directed by your caregiver.  Ask your caregiver if you should continue all prescribed and over-the-counter medicines.  Keep all follow-up appointments with your caregiver. SEEK MEDICAL CARE IF:  You have abdominal pain and it increases or stays in one area (localizes).  You have a rash, stiff neck, or severe headache.  You are irritable, sleepy, or difficult to awaken.  You are weak, dizzy, or extremely thirsty. SEEK IMMEDIATE MEDICAL CARE IF:   You are unable to keep fluids down or you get worse despite treatment.  You have frequent episodes of vomiting or diarrhea.  You have blood or green matter (bile) in your vomit.  You have blood in your stool or your stool looks black and tarry.  You have not urinated in 6 to 8 hours, or you have only urinated a small amount of very dark urine.  You have a fever.  You faint. MAKE SURE YOU:   Understand these instructions.  Will watch your condition.  Will get help right away if you are not doing well or get worse. Document Released: 04/04/2005 Document Revised: 06/27/2011 Document Reviewed: 11/22/2010 [employer] Patient Information 2014 [employer].  

## 2013-09-13 ENCOUNTER — Ambulatory Visit
Admission: RE | Admit: 2013-09-13 | Discharge: 2013-09-13 | Disposition: A | Discharge status: Home / Self Care | Payer: Medicaid Other | Source: Ambulatory Visit | Attending: Radiation Oncology | Admitting: Radiation Oncology

## 2013-09-13 ENCOUNTER — Encounter: Payer: Self-pay | Admitting: *Deleted

## 2013-09-13 ENCOUNTER — Ambulatory Visit: Payer: Self-pay

## 2013-09-13 ENCOUNTER — Ambulatory Visit: Payer: Self-pay | Admitting: Hematology and Oncology

## 2013-09-13 DIAGNOSIS — R634 Abnormal weight loss: Secondary | ICD-10-CM

## 2013-09-13 DIAGNOSIS — E860 Dehydration: Secondary | ICD-10-CM

## 2013-09-13 DIAGNOSIS — E162 Hypoglycemia, unspecified: Secondary | ICD-10-CM

## 2013-09-13 DIAGNOSIS — E46 Unspecified protein-calorie malnutrition: Secondary | ICD-10-CM

## 2013-09-13 DIAGNOSIS — N19 Unspecified kidney failure: Secondary | ICD-10-CM

## 2013-09-13 DIAGNOSIS — R112 Nausea with vomiting, unspecified: Secondary | ICD-10-CM

## 2013-09-13 LAB — CBC WITH DIFFERENTIAL/PLATELET
Basophils Absolute: 0 10*3/uL (ref 0.0–0.1)
Basophils Relative: 0 % (ref 0–1)
EOS ABS: 0.1 10*3/uL (ref 0.0–0.7)
EOS PCT: 2 % (ref 0–5)
HCT: 34.7 % — ABNORMAL LOW (ref 39.0–52.0)
Hemoglobin: 11.3 g/dL — ABNORMAL LOW (ref 13.0–17.0)
LYMPHS ABS: 0.3 10*3/uL — AB (ref 0.7–4.0)
Lymphocytes Relative: 8 % — ABNORMAL LOW (ref 12–46)
MCH: 29 pg (ref 26.0–34.0)
MCHC: 32.6 g/dL (ref 30.0–36.0)
MCV: 89 fL (ref 78.0–100.0)
MONO ABS: 0.4 10*3/uL (ref 0.1–1.0)
MONOS PCT: 9 % (ref 3–12)
Neutro Abs: 3.1 10*3/uL (ref 1.7–7.7)
Neutrophils Relative %: 81 % — ABNORMAL HIGH (ref 43–77)
Platelets: 173 10*3/uL (ref 150–400)
RBC: 3.9 MIL/uL — ABNORMAL LOW (ref 4.22–5.81)
RDW: 12.7 % (ref 11.5–15.5)
WBC: 3.8 10*3/uL — ABNORMAL LOW (ref 4.0–10.5)

## 2013-09-13 LAB — COMPREHENSIVE METABOLIC PANEL
ALBUMIN: 3 g/dL — AB (ref 3.5–5.2)
ALT: 68 U/L — ABNORMAL HIGH (ref 0–53)
AST: 41 U/L — AB (ref 0–37)
Alkaline Phosphatase: 75 U/L (ref 39–117)
BILIRUBIN TOTAL: 0.3 mg/dL (ref 0.3–1.2)
BUN: 25 mg/dL — ABNORMAL HIGH (ref 6–23)
CHLORIDE: 99 meq/L (ref 96–112)
CO2: 25 mEq/L (ref 19–32)
Calcium: 8.1 mg/dL — ABNORMAL LOW (ref 8.4–10.5)
Creatinine, Ser: 1.87 mg/dL — ABNORMAL HIGH (ref 0.50–1.35)
GFR calc Af Amer: 48 mL/min — ABNORMAL LOW (ref >90)
GFR calc non Af Amer: 42 mL/min — ABNORMAL LOW (ref >90)
Glucose, Bld: 96 mg/dL (ref 70–99)
Potassium: 4.4 mEq/L (ref 3.7–5.3)
Sodium: 137 mEq/L (ref 137–147)
Total Protein: 6.3 g/dL (ref 6.0–8.3)

## 2013-09-13 LAB — GLUCOSE, CAPILLARY
GLUCOSE-CAPILLARY: 95 mg/dL (ref 70–99)
GLUCOSE-CAPILLARY: 94 mg/dL (ref 70–99)
GLUCOSE-CAPILLARY: 91 mg/dL (ref 70–99)
Glucose-Capillary: 102 mg/dL — ABNORMAL HIGH (ref 70–99)
Glucose-Capillary: 82 mg/dL (ref 70–99)
Glucose-Capillary: 90 mg/dL (ref 70–99)

## 2013-09-13 MED ORDER — VITAL AF 1.2 CAL PO LIQD
1000.0000 mL | ORAL | Status: DC
  Administered 2013-09-13 – 2013-09-17 (×5): 1000 mL
  Filled 2013-09-13 (×6): qty 1000

## 2013-09-13 NOTE — Progress Notes (Signed)
Mason [name]   DOB:05-17-1965   KG#:[id number]   JSE#:[id number] I have seen the patient, examined him and edited the notes as follows  Subjective:  Afebrile. Nausea better controlled with current regimen. Since admission, he had 3 episodes of emesis. No blood noted in vomit. He reports that guaifenesin helps with nausea symptoms as well. Denies any respiratory or cardiac complaints. Appetite diminished due to nausea. No constipation. Last bowel movement on 5/27.  No confusion.To receive radiation treatment as scheduled At the time of evaluation, he started to significant mucus and productive cough which subsequently lead to gagging and vomiting.  Scheduled Meds: . chlorhexidine  15 mL Mouth/Throat BID  . enoxaparin (LOVENOX) injection  40 mg Subcutaneous Q24H  . guaiFENesin  600 mg Oral BID  . magic mouthwash w/lidocaine  10 mL Oral QID  . promethazine  25 mg Intravenous 4 times per day  . sodium fluoride  1 drop dental QHS   Continuous Infusions: . sodium chloride 200 mL/hr at 09/13/13 0420  . feeding supplement (VITAL AF 1.2 CAL)     PRN Meds:.acetaminophen, docusate sodium, HYDROmorphone (DILAUDID) injection, lidocaine-prilocaine, ondansetron (ZOFRAN) IV, ondansetron (ZOFRAN) IV, ondansetron, ondansetron, oxyCODONE, senna-docusate, sucralfate  Objective:  Filed Vitals:   09/13/13 0510  BP: 159/87  Pulse: 78  Temp: 98.2 F (36.8 C)  Resp: 20    Body mass index is 34.37 kg/(m^2).  Intake/Output Summary (Last 24 hours) at 09/13/13 0720 Last data filed at 09/13/13 0600  Gross per 24 hour  Intake   3575 ml  Output    675 ml  Net   2900 ml    GENERAL: alert, no distress and somewhat uncomfortable SKIN: skin color, texture, turgor are normal, no rashes or significant lesions EYES: normal, conjunctiva are pink and non-injected, sclera clear OROPHARYNX:no exudate, no erythema and lips, buccal mucosa, and tongue normal . Mild mucositis but no thrush NECK: supple, thyroid normal  size, non-tender, without nodularity LYMPH:  Persistent palpable lymphadenopathy in the neck, improved compared to baseline. LUNGS: clear to auscultation and percussion with normal breathing effort HEART: regular rate & rhythm and no murmurs and no lower extremity edema ABDOMEN:abdomen soft, non-tender and normal bowel sounds. Feeding tube normal.  Musculoskeletal:no cyanosis of digits and no clubbing  PSYCH: alert & oriented x 3 with fluent speech NEURO: no focal motor/sensory deficits    CBG (last 3)   Recent Labs  09/12/13 2038 09/13/13 0005 09/13/13 0423  GLUCAP 97 102* 95     Labs:   Recent Labs Lab 09/06/13 1438 09/13/13 0500  WBC 4.2 3.8*  HGB 12.0* 11.3*  HCT 36.9* 34.7*  PLT 295 173  MCV 90.0 89.0  MCH 29.2 29.0  MCHC 32.5 32.6  RDW 13.1 12.7  LYMPHSABS 0.6* 0.3*  MONOABS 0.6 0.4  EOSABS 0.1 0.1  BASOSABS 0.0 0.0     Chemistries:    Recent Labs Lab 09/06/13 1438 09/13/13 0500  NA 137 137  K 4.7 4.4  CL  --  99  CO2 29 25  GLUCOSE 77 96  BUN 10.6 25*  CREATININE 1.2 1.87*  CALCIUM 9.5 8.1*  MG 2.3  --   AST 31 41*  ALT 46 68*  ALKPHOS 83 75  BILITOT 0.34 0.3     Liver Function Tests:  Recent Labs Lab 09/06/13 1438 09/13/13 0500  AST 31 41*  ALT 46 68*  ALKPHOS 83 75  BILITOT 0.34 0.3  PROT 7.2 6.3  ALBUMIN 3.4* 3.0*   CBG:  Recent Labs Lab 09/12/13 2038 09/13/13 0005 09/13/13 0423  GLUCAP 97 102* 95    Assessment/Plan:  #1 locally advanced tonsil cancer  The patient is not able to tolerate outpatient management.  He can continue radiation therapy while hospitalized.   #2 uncontrolled nausea and vomiting with gagging  Patient reports that his mucus buildup cause these symptoms Recommend continuous IV fluids and IV anti-emetics around the lock, as this has worked well in prior admission.  Continue Mucinex, day 2, for production of mucous that contributed to his gagging.   #3 severe dehydration with recent renal  failure  Secondary to severe vomiting and dehydration Continue IV fluid resuscitation   #4 weight loss with malnutrition  Nutrition service consultation pending.  Currently he is on liquid diet If he continues to have for intractable nausea and vomiting, he might need to be started on total parenteral nutrition.   #5 DVT prophylaxis  On Lovenox   #6 Hypoglycemia Due to decreased oral intake. Will address issue with Nutrition service Monitor closely  #7 CODE STATUS  Full code    **Disclaimer: This note was dictated with voice recognition software. Similar sounding words can inadvertently be transcribed and this note may contain transcription errors which may not have been corrected upon publication of note.Rondel Jumbo, PA-C 09/13/2013  7:20 AM  [name], MD 09/13/2013

## 2013-09-13 NOTE — Progress Notes (Signed)
NUTRITION FOLLOW UP  Intervention:   -Initiate Vital 1.2 Cal via PEG @ 10 ml/ hr increasing 10 ml q 4 hours to goal rate of 90 ml/hr.  -Recommend refeeding labs once TF able to be initiated as pt with prolonged period of sub-optimal nutrition/significant unintentional wt loss -Continue TF regimen until pt able to meet >/=50% of estimated nutrition needs by mouth  -Consider TPN if pt continues to exhibit n/v/intolerance of EN  Nutrition Dx:   Inadequate oral intake related to persistent nausea/vomiting as evidenced by pt report of frequent emesis after any intake   Goal:   Pt to meet >/=90% of estimated nutrition needs    Monitor:   TF tolerance, weight trend, labs, GI profile, total protein/energy intake,    Assessment:   5/28: - Pt had PEG placed on 08/15/13  - Pt received cycle 2 chemotherapy on 09/10/2013  - Pt followed by [place] has lost 30 lbs x 1 month (9.1%), which is severe for time frame  - Pt reports poor PO intake x 1 month; dietary intake includes things like pudding, grits, jello, Ensure  - Pt reports using his PEG tube for the first time last night and he put 1 Ensure Plus and water in the tube. Pt reports that he vomited after using the tube  - Pt still very nauseous at time of assessment  - Recommend using PEG for sole source of nutrition until pt able to meet >/=50% of minimum estimated needs by PO intake  No muscle or subcutaneous fat depletion noticed.  K, Mg WNL  5/29: -Pt has refused initiation of TF d/t ongoing nausea and vomiting episodes. RN reported pt refused last night and this morning as he had 3 episodes emesis this AM. -Per discussion with RN, pt has only tolerated sips of gingerale -MD noted pt with mucous and gagging that exacerbates GI symptoms. Receiving Mucinex and phenergan. Possible TPN candidate if continues with intractable n/v and intolerance of EN. -Modified TF to start at lower trickle feed of 10 ml/hr to  possibly assist in improving tolerance. Recommend Phos/Mg/K be monitored once pt able to tolerate initiation of TF  Height: Ht Readings from Last 1 Encounters:  09/12/13 6\' 7"  (2.007 m)    Weight Status:   Wt Readings from Last 1 Encounters:  09/13/13 305 lb 3.2 oz (138.438 kg)  09/12/13 298 lbs  Re-estimated needs:  Kcal: 2600 - 2800  Protein: 140 - 160 grams  Fluid: 2.8 L fluid    Skin: chest incision, abdomen incision    Diet Order: Full Liquid   Intake/Output Summary (Last 24 hours) at 09/13/13 1043 Last data filed at 09/13/13 0600  Gross per 24 hour  Intake   3575 ml  Output    675 ml  Net   2900 ml    Last BM: 5/28   Labs:   Recent Labs Lab 09/06/13 1438 09/13/13 0500  NA 137 137  K 4.7 4.4  CL  --  99  CO2 29 25  BUN 10.6 25*  CREATININE 1.2 1.87*  CALCIUM 9.5 8.1*  MG 2.3  --   GLUCOSE 77 96    CBG (last 3)   Recent Labs  09/13/13 0005 09/13/13 0423 09/13/13 0812  GLUCAP 102* 95 94    Scheduled Meds: . chlorhexidine  15 mL Mouth/Throat BID  . enoxaparin (LOVENOX) injection  40 mg Subcutaneous Q24H  . guaiFENesin  600 mg Oral BID  .  magic mouthwash w/lidocaine  10 mL Oral QID  . promethazine  25 mg Intravenous 4 times per day    Continuous Infusions: . sodium chloride 100 mL/hr at 09/13/13 0922  . feeding supplement (VITAL AF 1.2 CAL)      Atlee Abide MS RD LDN Clinical Dietitian [phone]

## 2013-09-13 NOTE — Progress Notes (Signed)
Met with patient at [place] to provide support, encouragement and care continuity.  He stated he has been feeling better since getting continuous fluids and regular IV anti-emetics.  I will continue to follow during this admission.  Gayleen Orem, RN, BSN, [facility] Navigator [phone]

## 2013-09-14 DIAGNOSIS — N289 Disorder of kidney and ureter, unspecified: Secondary | ICD-10-CM

## 2013-09-14 LAB — BASIC METABOLIC PANEL
BUN: 31 mg/dL — AB (ref 6–23)
CALCIUM: 8 mg/dL — AB (ref 8.4–10.5)
CO2: 26 mEq/L (ref 19–32)
Chloride: 96 mEq/L (ref 96–112)
Creatinine, Ser: 2.12 mg/dL — ABNORMAL HIGH (ref 0.50–1.35)
GFR, EST AFRICAN AMERICAN: 41 mL/min — AB (ref >90)
GFR, EST NON AFRICAN AMERICAN: 36 mL/min — AB (ref >90)
Glucose, Bld: 90 mg/dL (ref 70–99)
Potassium: 3.8 mEq/L (ref 3.7–5.3)
Sodium: 135 mEq/L — ABNORMAL LOW (ref 137–147)

## 2013-09-14 LAB — GLUCOSE, CAPILLARY
GLUCOSE-CAPILLARY: 99 mg/dL (ref 70–99)
Glucose-Capillary: 100 mg/dL — ABNORMAL HIGH (ref 70–99)
Glucose-Capillary: 101 mg/dL — ABNORMAL HIGH (ref 70–99)
Glucose-Capillary: 79 mg/dL (ref 70–99)
Glucose-Capillary: 90 mg/dL (ref 70–99)
Glucose-Capillary: 94 mg/dL (ref 70–99)
Glucose-Capillary: 95 mg/dL (ref 70–99)

## 2013-09-14 NOTE — Progress Notes (Signed)
Mason [name]   DOB:[birth year]   IW#:[id number]   ERD#:[id number]  Subjective: thinks the nausea is better but urine is getting "more yellow"; gets OOBTC and ambulates in room at times; no pain "unless I'm vomiting," what causes him to vomit is coughing and trying to eat--"can't keep anything down." No family in room   Objective: middle aged 48 American male exmained in bed Filed Vitals:   09/14/13 1440  BP: 160/80  Pulse: 72  Temp: 99.2 F (37.3 C)  Resp: 18    Body mass index is 33.99 kg/(m^2). No intake or output data in the 24 hours ending 09/14/13 1638   Sclerae unicteric  Oropharynx moist  Lungs clear -- auscultated anterolaterally  Heart regular rate and rhythm  Abdomen obese, PEG in place, no erythema or tenderness  Neuro nonfocal, well-oriented, appropriate affect    CBG (last 3)   Recent Labs  09/14/13 0502 09/14/13 0750 09/14/13 1140  GLUCAP 94 100* 79     Labs:  Lab Results  Component Value Date   WBC 3.8* 09/13/2013   HGB 11.3* 09/13/2013   HCT 34.7* 09/13/2013   MCV 89.0 09/13/2013   PLT 173 09/13/2013   NEUTROABS 3.1 09/13/2013    @LASTCHEMISTRY @  Urine Studies No results found for this basename: UACOL, UAPR, USPG, UPH, UTP, UGL, UKET, UBIL, UHGB, UNIT, UROB, ULEU, UEPI, UWBC, URBC, UBAC, CAST, CRYS, UCOM, BILUA,  in the last 72 hours  Basic Metabolic Panel:  Recent Labs Lab 09/13/13 0500 09/14/13 0500  NA 137 135*  K 4.4 3.8  CL 99 96  CO2 25 26  GLUCOSE 96 90  BUN 25* 31*  CREATININE 1.87* 2.12*  CALCIUM 8.1* 8.0*   GFR Estimated Creatinine Clearance: 68.4 ml/min (by C-G formula based on Cr of 2.12). Liver Function Tests:  Recent Labs Lab 09/13/13 0500  AST 41*  ALT 68*  ALKPHOS 75  BILITOT 0.3  PROT 6.3  ALBUMIN 3.0*   No results found for this basename: LIPASE, AMYLASE,  in the last 168 hours No results found for this basename: AMMONIA,  in the last 168 hours Coagulation profile No results found for this basename:  INR, PROTIME,  in the last 168 hours  CBC:  Recent Labs Lab 09/13/13 0500  WBC 3.8*  NEUTROABS 3.1  HGB 11.3*  HCT 34.7*  MCV 89.0  PLT 173   Cardiac Enzymes: No results found for this basename: CKTOTAL, CKMB, CKMBINDEX, TROPONINI,  in the last 168 hours BNP: No components found with this basename: POCBNP,  CBG:  Recent Labs Lab 09/13/13 2022 09/14/13 0006 09/14/13 0502 09/14/13 0750 09/14/13 1140  GLUCAP 90 90 94 100* 79   D-Dimer No results found for this basename: DDIMER,  in the last 72 hours Hgb A1c No results found for this basename: HGBA1C,  in the last 72 hours Lipid Profile No results found for this basename: CHOL, HDL, LDLCALC, TRIG, CHOLHDL, LDLDIRECT,  in the last 72 hours Thyroid function studies No results found for this basename: TSH, T4TOTAL, FREET3, T3FREE, THYROIDAB,  in the last 72 hours Anemia work up No results found for this basename: VITAMINB12, FOLATE, FERRITIN, TIBC, IRON, RETICCTPCT,  in the last 72 hours Microbiology No results found for this or any previous visit (from the past 240 hour(s)).    Studies:  Dg Abd 1 View  08/30/2013   CLINICAL DATA:  G-tube placement  EXAM: ABDOMEN - 1 VIEW  COMPARISON:  08/28/2013  FINDINGS: G-tube was injected with 50 cc  of Omnipaque 300 and flushed with 50 cc of water.  Contrast opacifies gastric lumen and duodenum.  Findings compatible with intragastric position of gastrostomy tube.  No gastric outlet obstruction or contrast extravasation.  Bowel gas pattern normal.  Degenerative disc disease changes thoracolumbar spine.  IMPRESSION: Gastrostomy tube is located within the stomach.   Electronically Signed   By: Lavonia Dana M.D.   On: 08/30/2013 09:40   Dg Abd 1 View  08/28/2013   CLINICAL DATA:  Abdominal pain and nausea  EXAM: ABDOMEN - 1 VIEW  COMPARISON:  None.  FINDINGS: There is a gastrostomy type catheter in the left abdomen. Bowel gas pattern is unremarkable. No obstruction or free air is seen on  this supine examination. There are small phleboliths in the pelvis.  IMPRESSION: Bowel gas pattern unremarkable.   Electronically Signed   By: Lowella Grip M.D.   On: 08/28/2013 13:00   Ir Replc Gastro/colonic Tube Percut W/fluoro  09/02/2013   INDICATION: Ruptured gastrostomy balloon  EXAM: FLUOROSCOPIC GUIDED REPLACEMENT OF GASTROSTOMY TUBE  COMPARISON:  None.  MEDICATIONS: None.  CONTRAST:  61mL OMNIPAQUE IOHEXOL 300 MG/ML SOLN administered into the gastric lumen  FLUOROSCOPY TIME:  54 seconds.  COMPLICATIONS: None immediate  PROCEDURE: Informed written consent was obtained from the patient after a discussion of the risks, benefits and alternatives to treatment. Questions regarding the procedure were encouraged and answered. A timeout was performed prior to the initiation of the procedure.  The upper abdomen and external portion of the existing gastrostomy tube was prepped and draped in the usual sterile fashion, and a sterile drape was applied covering the operative field. Maximum barrier sterile technique with sterile gowns and gloves were used for the procedure. A timeout was performed prior to the initiation of the procedure.  The existing gastrostomy tube was injected with a small amount of contrast confirming appropriate positioning within the gastric lumen. The existing gastrostomy tube was cannulated with a short Amplatz wire which was coiled within the gastric fundus. Over the stiff guide wire, the existing gastrostomy tube was removed and exchanged for a new 18-French MIC balloon inflatable gastrostomy tube. The balloon was inflated and disc was cinched. Contrast was injected and a post exchange spot fluoroscopic image was obtained confirming appropriate intraluminal positioning. A dressing was placed. The patient tolerated the procedure well without immediate postprocedural complication.  IMPRESSION: Successful fluoroscopic guided replacement of a new 18-French gastrostomy tube. The new  gastrostomy tube is ready for immediate use.   Electronically Signed   By: Sandi Mariscal M.D.   On: 09/02/2013 14:53   Dg Ugi W/high Density W/kub  08/30/2013   CLINICAL DATA:  Evaluate for esophageal obstruction  EXAM: UPPER GI SERIES WITHOUT KUB  TECHNIQUE: Routine upper GI series was performed with thin barium.  FLUOROSCOPY TIME:  34 seconds  COMPARISON:  None.  FINDINGS: Initially the esophagus was imaged in the lateral orientation. The thin barium easily passed through the cervical portions of the of esophagus. No aspiration identified. Next, the esophagus was evaluated in the AP projection. Ingested bolus easily passed through the thoracic portions of the esophagus and into the stomach. Gastrostomy tube is identified within the stomach. The stomach has a normal configuration. Water-soluble contrast material was injected through the patient's gastrostomy tube which opacifies the gastric lumen and passes into the duodenum and proximal jejunum.  IMPRESSION: 1. No evidence for esophageal obstruction or aspiration. 2. Intraluminal location of the gastrostomy tube.   Electronically Signed   By:  Kerby Moors M.D.   On: 08/30/2013 13:05   Assessment: 48 y.o. [place] man currently day 5 cycle 2 cisplatin/ dose 5 IMRT for a locally advanced tonsillar cancer, admitted with uncontrolled nausea and vomiting  Plan:  (1) tonsillar CA: continue IMRT as tolerated, next cis-platinum dose not currently scheduled  (2) nausea/ vomiting: currently on promethazine IV Q6, zofran IV Q8 PRN (requested x2 so far today); can add IV dexamethasone but currently nausea moderately/well controlled  (3) ARI: creatinine worse today-- will increase IVF and follow  (4) malnutrition: cont current feeds  (5) pain: well-controlled on current meds (which of course can contribute to nausea); denies constipation  Chauncey Cruel, MD 09/14/2013  4:38 PM

## 2013-09-15 DIAGNOSIS — R52 Pain, unspecified: Secondary | ICD-10-CM

## 2013-09-15 DIAGNOSIS — E876 Hypokalemia: Secondary | ICD-10-CM

## 2013-09-15 LAB — CBC WITH DIFFERENTIAL/PLATELET
BASOS ABS: 0 10*3/uL (ref 0.0–0.1)
Basophils Relative: 0 % (ref 0–1)
EOS ABS: 0.1 10*3/uL (ref 0.0–0.7)
Eosinophils Relative: 3 % (ref 0–5)
HCT: 35.6 % — ABNORMAL LOW (ref 39.0–52.0)
Hemoglobin: 11.7 g/dL — ABNORMAL LOW (ref 13.0–17.0)
LYMPHS ABS: 0.4 10*3/uL — AB (ref 0.7–4.0)
LYMPHS PCT: 12 % (ref 12–46)
MCH: 28.6 pg (ref 26.0–34.0)
MCHC: 32.9 g/dL (ref 30.0–36.0)
MCV: 87 fL (ref 78.0–100.0)
Monocytes Absolute: 0.3 10*3/uL (ref 0.1–1.0)
Monocytes Relative: 9 % (ref 3–12)
NEUTROS PCT: 76 % (ref 43–77)
Neutro Abs: 2.4 10*3/uL (ref 1.7–7.7)
PLATELETS: 140 10*3/uL — AB (ref 150–400)
RBC: 4.09 MIL/uL — ABNORMAL LOW (ref 4.22–5.81)
RDW: 12.5 % (ref 11.5–15.5)
WBC: 3.2 10*3/uL — ABNORMAL LOW (ref 4.0–10.5)

## 2013-09-15 LAB — BASIC METABOLIC PANEL
BUN: 31 mg/dL — ABNORMAL HIGH (ref 6–23)
CALCIUM: 7.5 mg/dL — AB (ref 8.4–10.5)
CO2: 27 mEq/L (ref 19–32)
CREATININE: 2.08 mg/dL — AB (ref 0.50–1.35)
Chloride: 96 mEq/L (ref 96–112)
GFR calc Af Amer: 42 mL/min — ABNORMAL LOW (ref >90)
GFR calc non Af Amer: 37 mL/min — ABNORMAL LOW (ref >90)
Glucose, Bld: 101 mg/dL — ABNORMAL HIGH (ref 70–99)
Potassium: 3.4 mEq/L — ABNORMAL LOW (ref 3.7–5.3)
SODIUM: 135 meq/L — AB (ref 137–147)

## 2013-09-15 LAB — GLUCOSE, CAPILLARY
GLUCOSE-CAPILLARY: 106 mg/dL — AB (ref 70–99)
GLUCOSE-CAPILLARY: 92 mg/dL (ref 70–99)
Glucose-Capillary: 91 mg/dL (ref 70–99)
Glucose-Capillary: 93 mg/dL (ref 70–99)
Glucose-Capillary: 95 mg/dL (ref 70–99)

## 2013-09-15 MED ORDER — POTASSIUM CHLORIDE 10 MEQ/100ML IV SOLN
10.0000 meq | INTRAVENOUS | Status: AC
  Administered 2013-09-15 (×2): 10 meq via INTRAVENOUS
  Filled 2013-09-15 (×2): qty 100

## 2013-09-15 NOTE — Progress Notes (Signed)
Patient refused lovenox tonight.

## 2013-09-15 NOTE — Progress Notes (Signed)
Mason [name]   DOB:04-Dec-1965   YK#:[id number]   NLZ#:[id number]  Subjective: .vomited x 2 today; not nauseated at present; pain only happens when he vomits; had soft BM; was able t walk w/o difficulty; No family in room   Objective: middle aged African American male exmained in bed Filed Vitals:   09/15/13 0625  BP: 158/98  Pulse:   Temp:   Resp:     Body mass index is 33.99 kg/(m^2).  Intake/Output Summary (Last 24 hours) at 09/15/13 1402 Last data filed at 09/15/13 0641  Gross per 24 hour  Intake   6504 ml  Output   2005 ml  Net   4499 ml     Sclerae unicteric  Oropharynx moist, no thrush  Lungs clear -- auscultated anterolaterally  Heart regular rate and rhythm  Abdomen obese, PEG in place, no erythema, +BS  Neuro nonfocal, well-oriented, positive affect    CBG (last 3)   Recent Labs  09/14/13 2346 09/15/13 0826 09/15/13 1211  GLUCAP 101* 106* 95     Labs:  Lab Results  Component Value Date   WBC 3.2* 09/15/2013   HGB 11.7* 09/15/2013   HCT 35.6* 09/15/2013   MCV 87.0 09/15/2013   PLT 140* 09/15/2013   NEUTROABS 2.4 09/15/2013    @LASTCHEMISTRY @  Urine Studies No results found for this basename: UACOL, UAPR, USPG, UPH, UTP, UGL, UKET, UBIL, UHGB, UNIT, UROB, ULEU, UEPI, UWBC, URBC, UBAC, CAST, CRYS, UCOM, BILUA,  in the last 72 hours  Basic Metabolic Panel:  Recent Labs Lab 09/13/13 0500 09/14/13 0500 09/15/13 0348  NA 137 135* 135*  K 4.4 3.8 3.4*  CL 99 96 96  CO2 25 26 27   GLUCOSE 96 90 101*  BUN 25* 31* 31*  CREATININE 1.87* 2.12* 2.08*  CALCIUM 8.1* 8.0* 7.5*   GFR Estimated Creatinine Clearance: 69.7 ml/min (by C-G formula based on Cr of 2.08). Liver Function Tests:  Recent Labs Lab 09/13/13 0500  AST 41*  ALT 68*  ALKPHOS 75  BILITOT 0.3  PROT 6.3  ALBUMIN 3.0*   No results found for this basename: LIPASE, AMYLASE,  in the last 168 hours No results found for this basename: AMMONIA,  in the last 168 hours Coagulation  profile No results found for this basename: INR, PROTIME,  in the last 168 hours  CBC:  Recent Labs Lab 09/13/13 0500 09/15/13 0348  WBC 3.8* 3.2*  NEUTROABS 3.1 2.4  HGB 11.3* 11.7*  HCT 34.7* 35.6*  MCV 89.0 87.0  PLT 173 140*   Cardiac Enzymes: No results found for this basename: CKTOTAL, CKMB, CKMBINDEX, TROPONINI,  in the last 168 hours BNP: No components found with this basename: POCBNP,  CBG:  Recent Labs Lab 09/14/13 1714 09/14/13 2006 09/14/13 2346 09/15/13 0826 09/15/13 1211  GLUCAP 99 95 101* 106* 95   D-Dimer No results found for this basename: DDIMER,  in the last 72 hours Hgb A1c No results found for this basename: HGBA1C,  in the last 72 hours Lipid Profile No results found for this basename: CHOL, HDL, LDLCALC, TRIG, CHOLHDL, LDLDIRECT,  in the last 72 hours Thyroid function studies No results found for this basename: TSH, T4TOTAL, FREET3, T3FREE, THYROIDAB,  in the last 72 hours Anemia work up No results found for this basename: VITAMINB12, FOLATE, FERRITIN, TIBC, IRON, RETICCTPCT,  in the last 72 hours Microbiology No results found for this or any previous visit (from the past 240 hour(s)).    Studies:  Dg Abd  1 View  08/30/2013   CLINICAL DATA:  G-tube placement  EXAM: ABDOMEN - 1 VIEW  COMPARISON:  08/28/2013  FINDINGS: G-tube was injected with 50 cc of Omnipaque 300 and flushed with 50 cc of water.  Contrast opacifies gastric lumen and duodenum.  Findings compatible with intragastric position of gastrostomy tube.  No gastric outlet obstruction or contrast extravasation.  Bowel gas pattern normal.  Degenerative disc disease changes thoracolumbar spine.  IMPRESSION: Gastrostomy tube is located within the stomach.   Electronically Signed   By: Lavonia Dana M.D.   On: 08/30/2013 09:40   Dg Abd 1 View  08/28/2013   CLINICAL DATA:  Abdominal pain and nausea  EXAM: ABDOMEN - 1 VIEW  COMPARISON:  None.  FINDINGS: There is a gastrostomy type catheter in  the left abdomen. Bowel gas pattern is unremarkable. No obstruction or free air is seen on this supine examination. There are small phleboliths in the pelvis.  IMPRESSION: Bowel gas pattern unremarkable.   Electronically Signed   By: Lowella Grip M.D.   On: 08/28/2013 13:00   Ir Replc Gastro/colonic Tube Percut W/fluoro  09/02/2013   INDICATION: Ruptured gastrostomy balloon  EXAM: FLUOROSCOPIC GUIDED REPLACEMENT OF GASTROSTOMY TUBE  COMPARISON:  None.  MEDICATIONS: None.  CONTRAST:  40mL OMNIPAQUE IOHEXOL 300 MG/ML SOLN administered into the gastric lumen  FLUOROSCOPY TIME:  54 seconds.  COMPLICATIONS: None immediate  PROCEDURE: Informed written consent was obtained from the patient after a discussion of the risks, benefits and alternatives to treatment. Questions regarding the procedure were encouraged and answered. A timeout was performed prior to the initiation of the procedure.  The upper abdomen and external portion of the existing gastrostomy tube was prepped and draped in the usual sterile fashion, and a sterile drape was applied covering the operative field. Maximum barrier sterile technique with sterile gowns and gloves were used for the procedure. A timeout was performed prior to the initiation of the procedure.  The existing gastrostomy tube was injected with a small amount of contrast confirming appropriate positioning within the gastric lumen. The existing gastrostomy tube was cannulated with a short Amplatz wire which was coiled within the gastric fundus. Over the stiff guide wire, the existing gastrostomy tube was removed and exchanged for a new 18-French MIC balloon inflatable gastrostomy tube. The balloon was inflated and disc was cinched. Contrast was injected and a post exchange spot fluoroscopic image was obtained confirming appropriate intraluminal positioning. A dressing was placed. The patient tolerated the procedure well without immediate postprocedural complication.  IMPRESSION:  Successful fluoroscopic guided replacement of a new 18-French gastrostomy tube. The new gastrostomy tube is ready for immediate use.   Electronically Signed   By: Sandi Mariscal M.D.   On: 09/02/2013 14:53   Dg Ugi W/high Density W/kub  08/30/2013   CLINICAL DATA:  Evaluate for esophageal obstruction  EXAM: UPPER GI SERIES WITHOUT KUB  TECHNIQUE: Routine upper GI series was performed with thin barium.  FLUOROSCOPY TIME:  34 seconds  COMPARISON:  None.  FINDINGS: Initially the esophagus was imaged in the lateral orientation. The thin barium easily passed through the cervical portions of the of esophagus. No aspiration identified. Next, the esophagus was evaluated in the AP projection. Ingested bolus easily passed through the thoracic portions of the esophagus and into the stomach. Gastrostomy tube is identified within the stomach. The stomach has a normal configuration. Water-soluble contrast material was injected through the patient's gastrostomy tube which opacifies the gastric lumen and passes into  the duodenum and proximal jejunum.  IMPRESSION: 1. No evidence for esophageal obstruction or aspiration. 2. Intraluminal location of the gastrostomy tube.   Electronically Signed   By: Kerby Moors M.D.   On: 08/30/2013 13:05   Assessment: 48 y.o. 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, admitted with uncontrolled nausea and vomiting  Plan:  (1) tonsillar CA: continue IMRT as tolerated, next cis-platinum dose not currently scheduled  (2) nausea/ vomiting: currently on promethazine IV Q6, zofran IV Q8 PRN (requested x2 so far today); can add IV dexamethasone but currently nausea moderately/ well controlled  (3) ARI: creatinine slightly improved-- continue IVF support at current rate  (4) malnutrition: cont current feeds  (5) pain: well-controlled on current meds (which of course can contribute to nausea); denies constipation  (6) hypokalemia:  replace  Chauncey Cruel, MD 09/15/2013  2:02 PM

## 2013-09-15 NOTE — Plan of Care (Signed)
Problem: Phase I Progression Outcomes Goal: Hemodynamically stable Outcome: Progressing BP remains elevated at times

## 2013-09-16 ENCOUNTER — Encounter: Payer: Self-pay | Admitting: Radiation Oncology

## 2013-09-16 ENCOUNTER — Ambulatory Visit: Payer: Medicaid Other

## 2013-09-16 ENCOUNTER — Other Ambulatory Visit: Payer: Self-pay | Admitting: Hematology and Oncology

## 2013-09-16 ENCOUNTER — Encounter: Payer: Self-pay | Admitting: *Deleted

## 2013-09-16 ENCOUNTER — Ambulatory Visit: Payer: Self-pay | Admitting: Hematology and Oncology

## 2013-09-16 DIAGNOSIS — I82409 Acute embolism and thrombosis of unspecified deep veins of unspecified lower extremity: Secondary | ICD-10-CM

## 2013-09-16 DIAGNOSIS — E43 Unspecified severe protein-calorie malnutrition: Secondary | ICD-10-CM

## 2013-09-16 LAB — BASIC METABOLIC PANEL
BUN: 26 mg/dL — AB (ref 6–23)
CHLORIDE: 94 meq/L — AB (ref 96–112)
CO2: 28 mEq/L (ref 19–32)
Calcium: 7.2 mg/dL — ABNORMAL LOW (ref 8.4–10.5)
Creatinine, Ser: 1.72 mg/dL — ABNORMAL HIGH (ref 0.50–1.35)
GFR calc Af Amer: 53 mL/min — ABNORMAL LOW (ref >90)
GFR, EST NON AFRICAN AMERICAN: 46 mL/min — AB (ref >90)
GLUCOSE: 105 mg/dL — AB (ref 70–99)
Potassium: 3.5 mEq/L — ABNORMAL LOW (ref 3.7–5.3)
Sodium: 134 mEq/L — ABNORMAL LOW (ref 137–147)

## 2013-09-16 LAB — GLUCOSE, CAPILLARY
GLUCOSE-CAPILLARY: 85 mg/dL (ref 70–99)
Glucose-Capillary: 107 mg/dL — ABNORMAL HIGH (ref 70–99)
Glucose-Capillary: 88 mg/dL (ref 70–99)
Glucose-Capillary: 96 mg/dL (ref 70–99)

## 2013-09-16 MED ORDER — GUAIFENESIN ER 600 MG PO TB12
1200.0000 mg | ORAL_TABLET | Freq: Two times a day (BID) | ORAL | Status: DC
  Administered 2013-09-16 – 2013-10-04 (×36): 1200 mg via ORAL
  Filled 2013-09-16 (×37): qty 2

## 2013-09-16 MED ORDER — CHLORHEXIDINE GLUCONATE 0.12 % MT SOLN: 15.0000 mL | Freq: Two times a day (BID) | OROMUCOSAL | Status: DC

## 2013-09-16 MED ORDER — SCOPOLAMINE 1 MG/3DAYS TD PT72
1.0000 | MEDICATED_PATCH | TRANSDERMAL | Status: DC
  Administered 2013-09-16: 1.5 mg via TRANSDERMAL
  Filled 2013-09-16: qty 1

## 2013-09-16 MED ORDER — PROMETHAZINE HCL 25 MG/ML IJ SOLN
25.0000 mg | Freq: Four times a day (QID) | INTRAMUSCULAR | Status: DC | PRN
  Administered 2013-09-17 (×3): 25 mg via INTRAVENOUS
  Filled 2013-09-16: qty 1

## 2013-09-16 MED ORDER — CHLORHEXIDINE GLUCONATE 0.12 % MT SOLN
15.0000 mL | Freq: Four times a day (QID) | OROMUCOSAL | Status: DC
  Administered 2013-09-16 – 2013-10-04 (×65): 15 mL via OROMUCOSAL
  Filled 2013-09-16 (×75): qty 15

## 2013-09-16 NOTE — Progress Notes (Signed)
UR COMPLETED  

## 2013-09-16 NOTE — Progress Notes (Signed)
Patient with intermit episodes of spitting up phlegm and with retching from that some emesis. "Comes on all of a sudden" per patient. "Mucous builds up" and "makes me sick" per patient. Patient with thick throat and mouth secretions at times. Tube feeding at 50 cc/h. Patient with no residuals when checked. No abdominal pain. Patient does not want tube feeding advanced beyond 50 ml/h currently. Will monitor.

## 2013-09-16 NOTE — Progress Notes (Signed)
Visited briefly with patient at [facility].  He stated he felt somewhat better but had difficulty sleeping last HS.  He requested that his RT be pushed to late in the afternoon so he could get some sleep.  Tomo informed me that today's tmt had been cancelled by Dr. Isidore Moos.  Continuing navigation as L1 patient (new patient).  Gayleen Orem, RN, BSN, [facility] Oncology Navigator [phone]

## 2013-09-16 NOTE — Progress Notes (Signed)
Patient refused lovenox again tonight.

## 2013-09-16 NOTE — Progress Notes (Signed)
   Weekly Management Note:  Inpatient Current Dose:  34 Gy  Projected Dose: 70 Gy   Narrative:  The patient presents for routine under treatment assessment.  CBCT/MVCT images/Port film x-rays were reviewed.  The chart was checked. Admitted to hospital due to nausea/vomiting. He does not think he can tolerate RT today due to this.  Received chemotherapy last week.  Physical Findings:  height is 6\' 7"  (2.007 m) and weight is 304 lb 12.8 oz (138.256 kg). His oral temperature is 98 F (36.7 C). His blood pressure is 159/102 and his pulse is 70. His respiration is 16 and oxygen saturation is 99%.  Burping while speaking over phone.  CBC    Component Value Date/Time   WBC 3.2* 09/15/2013 0348   WBC 4.2 09/06/2013 1438   RBC 4.09* 09/15/2013 0348   RBC 4.10* 09/06/2013 1438   HGB 11.7* 09/15/2013 0348   HGB 12.0* 09/06/2013 1438   HCT 35.6* 09/15/2013 0348   HCT 36.9* 09/06/2013 1438   PLT 140* 09/15/2013 0348   PLT 295 09/06/2013 1438   MCV 87.0 09/15/2013 0348   MCV 90.0 09/06/2013 1438   MCH 28.6 09/15/2013 0348   MCH 29.2 09/06/2013 1438   MCHC 32.9 09/15/2013 0348   MCHC 32.5 09/06/2013 1438   RDW 12.5 09/15/2013 0348   RDW 13.1 09/06/2013 1438   LYMPHSABS 0.4* 09/15/2013 0348   LYMPHSABS 0.6* 09/06/2013 1438   MONOABS 0.3 09/15/2013 0348   MONOABS 0.6 09/06/2013 1438   EOSABS 0.1 09/15/2013 0348   EOSABS 0.1 09/06/2013 1438   BASOSABS 0.0 09/15/2013 0348   BASOSABS 0.0 09/06/2013 1438     CMP     Component Value Date/Time   NA 134* 09/16/2013 0530   NA 137 09/06/2013 1438   K 3.5* 09/16/2013 0530   K 4.7 09/06/2013 1438   CL 94* 09/16/2013 0530   CO2 28 09/16/2013 0530   CO2 29 09/06/2013 1438   GLUCOSE 105* 09/16/2013 0530   GLUCOSE 77 09/06/2013 1438   BUN 26* 09/16/2013 0530   BUN 10.6 09/06/2013 1438   CREATININE 1.72* 09/16/2013 0530   CREATININE 1.2 09/06/2013 1438   CALCIUM 7.2* 09/16/2013 0530   CALCIUM 9.5 09/06/2013 1438   PROT 6.3 09/13/2013 0500   PROT 7.2 09/06/2013 1438   ALBUMIN 3.0*  09/13/2013 0500   ALBUMIN 3.4* 09/06/2013 1438   AST 41* 09/13/2013 0500   AST 31 09/06/2013 1438   ALT 68* 09/13/2013 0500   ALT 46 09/06/2013 1438   ALKPHOS 75 09/13/2013 0500   ALKPHOS 83 09/06/2013 1438   BILITOT 0.3 09/13/2013 0500   BILITOT 0.34 09/06/2013 1438   GFRNONAA 46* 09/16/2013 0530   GFRAA 53* 09/16/2013 0530     Impression:  The patient is tolerating radiotherapy but has nausea probably related to chemotherapy cycle.   Plan:  Continue radiotherapy as planned tomorrow, if patient willing.  He understands importance of minimizing treatment breaks. Today, he does not think he can wear RT mask without vomiting during treatment, so we will hold RT today.  -----------------------------------  Eppie Gibson, MD

## 2013-09-16 NOTE — Progress Notes (Signed)
Mason [name]   DOB:02/27/1966   HE#:[id number]   NTI#:[id number]  I have seen the patient, examined him and edited the notes as follows  Subjective:  Afebrile. No blood noted in vomit. Nausea is controlled until guaifenesin "runs out". At that time, nausea and vomiting returns due to mucous thickening and buildup. At the time of evaluation, he is nauseous but no vomiting is present -he is due for his guaifenesin dose. Denies any respiratory or cardiac complaints. Appetite diminished due to nausea. No constipation. Last bowel movement on 5/31 without blood in the stools. Ambulates without difficulty.  No confusion.To receive radiation treatment as scheduled.  His tube feeding was commenced over the weekend.  Scheduled Meds: . chlorhexidine  15 mL Mouth/Throat BID  . enoxaparin (LOVENOX) injection  40 mg Subcutaneous Q24H  . guaiFENesin  600 mg Oral BID  . magic mouthwash w/lidocaine  10 mL Oral QID  . promethazine  25 mg Intravenous 4 times per day   Continuous Infusions: . sodium chloride 1,000 mL (09/15/13 2308)  . feeding supplement (VITAL AF 1.2 CAL) 1,000 mL (09/15/13 2255)   PRN Meds:.acetaminophen, docusate sodium, HYDROmorphone (DILAUDID) injection, lidocaine-prilocaine, ondansetron (ZOFRAN) IV, ondansetron (ZOFRAN) IV, ondansetron, ondansetron, oxyCODONE, senna-docusate, sucralfate  Objective:  Filed Vitals:   09/16/13 0600  BP: 160/106  Pulse: 83  Temp: 98.1 F (36.7 C)  Resp: 20    Body mass index is 34.32 kg/(m^2).  Intake/Output Summary (Last 24 hours) at 09/16/13 0753 Last data filed at 09/16/13 0250  Gross per 24 hour  Intake 3748.67 ml  Output   2275 ml  Net 1473.67 ml    GENERAL: alert, no distress and somewhat uncomfortable SKIN: skin color, texture, turgor are normal, no rashes or significant lesions EYES: normal, conjunctiva are pink and non-injected, sclera clear OROPHARYNX:no exudate, no erythema and lips, buccal mucosa, and tongue normal. No  mucositis NECK: supple, thyroid normal size, non-tender, without nodularity LYMPH:  Persistent palpable lymphadenopathy in the neck, improved compared to baseline. LUNGS: clear to auscultation and percussion with normal breathing effort HEART: regular rate & rhythm and no murmurs and no lower extremity edema ABDOMEN:abdomen soft, non-tender and normal bowel sounds. Feeding tube normal.  Musculoskeletal:no cyanosis of digits and no clubbing  PSYCH: alert & oriented x 3 with fluent speech NEURO: no focal motor/sensory deficits    CBG (last 3)   Recent Labs  09/15/13 1525 09/15/13 1955 09/15/13 2357  GLUCAP 92 93 91     Labs:   Recent Labs Lab 09/13/13 0500 09/15/13 0348  WBC 3.8* 3.2*  HGB 11.3* 11.7*  HCT 34.7* 35.6*  PLT 173 140*  MCV 89.0 87.0  MCH 29.0 28.6  MCHC 32.6 32.9  RDW 12.7 12.5  LYMPHSABS 0.3* 0.4*  MONOABS 0.4 0.3  EOSABS 0.1 0.1  BASOSABS 0.0 0.0     Chemistries:    Recent Labs Lab 09/13/13 0500 09/14/13 0500 09/15/13 0348 09/16/13 0530  NA 137 135* 135* 134*  K 4.4 3.8 3.4* 3.5*  CL 99 96 96 94*  CO2 25 26 27 28   GLUCOSE 96 90 101* 105*  BUN 25* 31* 31* 26*  CREATININE 1.87* 2.12* 2.08* 1.72*  CALCIUM 8.1* 8.0* 7.5* 7.2*  AST 41*  --   --   --   ALT 68*  --   --   --   ALKPHOS 75  --   --   --   BILITOT 0.3  --   --   --  Liver Function Tests:  Recent Labs Lab 09/13/13 0500  AST 41*  ALT 68*  ALKPHOS 75  BILITOT 0.3  PROT 6.3  ALBUMIN 3.0*   CBG:  Recent Labs Lab 09/15/13 0826 09/15/13 1211 09/15/13 1525 09/15/13 1955 09/15/13 2357  GLUCAP 106* 95 92 93 91    Assessment/Plan:  #1 locally advanced tonsil cancer  The patient is not able to tolerate outpatient management. He can continue radiation therapy while hospitalized.   #2 uncontrolled nausea and vomiting with gagging  Patient reports that his mucus buildup cause these symptoms He was placed on  IV fluids and IV anti-emetics around the lock with  promethazine IV Q6, zofran IV Q8 prn as this has worked well in prior admission.  Continue Mucinex, day 5, for production of mucous that contributed to his gagging. Will plan to increase dose to 1200 mg bid.  I discussed with him the risk and benefits of starting him on scopolamine patch. He agreed to try. I would discontinue scheduled promethazine starting tomorrow to avoid increased risk of sedation and interaction with scopolamine patch.  #3 severe dehydration with recent renal failure  Secondary to severe vomiting and dehydration Placed on IV fluid resuscitation, plan to continue the same. Renal failure improving.  #4 weight loss with malnutrition  Nutrition service consultation appreciated. PEG tube feeding has been increased to goal 90 ml/hr. Total parenteral nutrition to be considered if he continues to have nausea and vomiting.  #5 DVT prophylaxis  On Lovenox. Of note, patient refused 5/30 evening dose. I discussed with the patient importance of Lovenox injection to reduce risk of thrombosis. He agreed to continue Lovenox injections daily.  #6 Hypoglycemia Due to decreased oral intake. Monitor closely  #7 CODE STATUS  Full code    **Disclaimer: This note was dictated with voice recognition software. Similar sounding words can inadvertently be transcribed and this note may contain transcription errors which may not have been corrected upon publication of note.Rondel Jumbo, PA-C 09/16/2013  7:53 AM  [name], MD 09/16/2013

## 2013-09-16 NOTE — Progress Notes (Signed)
NUTRITION FOLLOW UP  Intervention:   -Continue to increase Vital 1.2 Cal by 10 ml q 4 hours to goal rate of 90 ml/hr as tolerated  -Continue TF regimen until pt able to meet >/=50% of estimated nutrition needs by mouth  -Consider TPN if pt continues to exhibit n/v/intolerance of EN  Nutrition Dx:   Inadequate oral intake related to persistent nausea/vomiting as evidenced by pt report of frequent emesis after any intake-ongoing   Goal:   Pt to meet >/=90% of estimated nutrition needs -progressing with TF   Monitor:   TF tolerance, weight trend, labs, GI profile, total protein/energy intake   Assessment:   5/28: - Pt had PEG placed on 08/15/13  - Pt received cycle 2 chemotherapy on 09/10/2013  - Pt followed by [facility] has lost 30 lbs x 1 month (9.1%), which is severe for time frame  - Pt reports poor PO intake x 1 month; dietary intake includes things like pudding, grits, jello, Ensure  - Pt reports using his PEG tube for the first time last night and he put 1 Ensure Plus and water in the tube. Pt reports that he vomited after using the tube  - Pt still very nauseous at time of assessment  - Recommend using PEG for sole source of nutrition until pt able to meet >/=50% of minimum estimated needs by PO intake  No muscle or subcutaneous fat depletion noticed.  K, Mg WNL  5/29: -Pt has refused initiation of TF d/t ongoing nausea and vomiting episodes. RN reported pt refused last night and this morning as he had 3 episodes emesis this AM. -Per discussion with RN, pt has only tolerated sips of gingerale -MD noted pt with mucous and gagging that exacerbates GI symptoms. Receiving Mucinex and phenergan. Possible TPN candidate if continues with intractable n/v and intolerance of EN. -Modified TF to start at lower trickle feed of 10 ml/hr to possibly assist in improving tolerance. Recommend Phos/Mg/K be monitored once pt able to tolerate initiation of  TF  6/01: -Vital AF 1.2 currently running at 50 ml/hr, providing 1440 kcal (55% est kcal needs), and 90 gram protein (64% est protein needs). Pt tolerating w/out residuals. Pt refused further TF advancement on 5/31 -IVF running at 150 ml/hr -MD noted pt continues with nausea but no vomiting. Exhibits mucous/phlem/gagging that makes PO intake difficult/nonexistent.  PO intake 0% -Pt agreeable to trial advancement of TF to 60 ml/hr during time of RD follow. Declined any PO beverages. Informed RN to advance TF and assess tolerance.  -Recommend to continue to advance to Vital AF 1.2 to goal of 90 ml/hr as tolerated -K low -Renal function improving with hydration  Height: Ht Readings from Last 1 Encounters:  09/12/13 6\' 7"  (2.007 m)    Weight Status:   Wt Readings from Last 1 Encounters:  09/16/13 304 lb 12.8 oz (138.256 kg)  09/12/13 298 lbs  Re-estimated needs:  Kcal: 2600 - 2800  Protein: 140 - 160 grams  Fluid: 2.8 L fluid    Skin: chest incision, abdomen incision    Diet Order: Full Liquid   Intake/Output Summary (Last 24 hours) at 09/16/13 1445 Last data filed at 09/16/13 1309  Gross per 24 hour  Intake 5131.67 ml  Output   1700 ml  Net 3431.67 ml    Last BM: 5/31   Labs:   Recent Labs Lab 09/14/13 0500 09/15/13 0348 09/16/13 0530  NA 135* 135* 134*  K 3.8 3.4* 3.5*  CL 96 96 94*  CO2 26 27 28   BUN 31* 31* 26*  CREATININE 2.12* 2.08* 1.72*  CALCIUM 8.0* 7.5* 7.2*  GLUCOSE 90 101* 105*    CBG (last 3)   Recent Labs  09/15/13 1955 09/15/13 2357 09/16/13 1226  GLUCAP 93 91 96    Scheduled Meds: . chlorhexidine  15 mL Mouth/Throat QID  . enoxaparin (LOVENOX) injection  40 mg Subcutaneous Q24H  . guaiFENesin  1,200 mg Oral BID  . magic mouthwash w/lidocaine  10 mL Oral QID  . promethazine  25 mg Intravenous 4 times per day  . scopolamine  1 patch Transdermal Q72H    Continuous Infusions: . sodium chloride 150 mL/hr at 09/16/13 1000  .  feeding supplement (VITAL AF 1.2 CAL) 1,000 mL (09/16/13 1426)    [place] LDN Clinical Dietitian [phone]

## 2013-09-16 NOTE — Progress Notes (Signed)
Pt. Stated that Scoplamine patch that was applied was making him feel dizzy and his mouth had become very dry.  Pt. Requested that RN remove patch. Patch was removed.

## 2013-09-17 ENCOUNTER — Telehealth: Payer: Self-pay | Admitting: *Deleted

## 2013-09-17 ENCOUNTER — Encounter: Payer: Self-pay | Admitting: *Deleted

## 2013-09-17 ENCOUNTER — Ambulatory Visit: Admission: RE | Admit: 2013-09-17 | Payer: Medicaid Other | Source: Ambulatory Visit

## 2013-09-17 DIAGNOSIS — R509 Fever, unspecified: Secondary | ICD-10-CM

## 2013-09-17 DIAGNOSIS — E161 Other hypoglycemia: Secondary | ICD-10-CM

## 2013-09-17 LAB — GLUCOSE, CAPILLARY
GLUCOSE-CAPILLARY: 112 mg/dL — AB (ref 70–99)
GLUCOSE-CAPILLARY: 112 mg/dL — AB (ref 70–99)
GLUCOSE-CAPILLARY: 133 mg/dL — AB (ref 70–99)
GLUCOSE-CAPILLARY: 156 mg/dL — AB (ref 70–99)
Glucose-Capillary: 109 mg/dL — ABNORMAL HIGH (ref 70–99)

## 2013-09-17 LAB — BASIC METABOLIC PANEL
BUN: 23 mg/dL (ref 6–23)
CALCIUM: 7.1 mg/dL — AB (ref 8.4–10.5)
CO2: 24 mEq/L (ref 19–32)
CREATININE: 1.67 mg/dL — AB (ref 0.50–1.35)
Chloride: 91 mEq/L — ABNORMAL LOW (ref 96–112)
GFR calc non Af Amer: 48 mL/min — ABNORMAL LOW (ref >90)
GFR, EST AFRICAN AMERICAN: 55 mL/min — AB (ref >90)
Glucose, Bld: 152 mg/dL — ABNORMAL HIGH (ref 70–99)
Potassium: 3.2 mEq/L — ABNORMAL LOW (ref 3.7–5.3)
SODIUM: 134 meq/L — AB (ref 137–147)

## 2013-09-17 MED ORDER — DEXAMETHASONE SODIUM PHOSPHATE 4 MG/ML IJ SOLN
8.0000 mg | Freq: Every day | INTRAMUSCULAR | Status: AC
  Administered 2013-09-18 – 2013-09-21 (×4): 8 mg via INTRAVENOUS
  Filled 2013-09-17 (×4): qty 2

## 2013-09-17 MED ORDER — PROMETHAZINE HCL 25 MG/ML IJ SOLN
25.0000 mg | Freq: Four times a day (QID) | INTRAMUSCULAR | Status: DC
  Administered 2013-09-17 – 2013-09-23 (×23): 25 mg via INTRAVENOUS
  Filled 2013-09-17 (×36): qty 1

## 2013-09-17 NOTE — Progress Notes (Signed)
Mason [name]   DOB:[birth year]   QI#:[id number]   XLK#:[id number]  I have seen the patient, examined him and edited the notes as follows  Subjective:   At the time of evaluation, he is nauseous but no vomiting is present. He attributes his nausea to the feeding tube, which was initiated over the weekend. He complains that "it is too much food going into it". Guaifenesin dose increase has helped with mucus thinning. Scopolamine was tried to decrease secretions, but it was removed soon after due to very dry mouth and dizziness were manifested as side effects, with reversal of symptoms. Denies any respiratory or cardiac complaints. Appetite diminished due to nausea. No constipation. Last bowel movement on 6/1 without blood in the stools. No confusion.To receive radiation treatment as scheduled. He has low grade fever this morning.   Scheduled Meds: . chlorhexidine  15 mL Mouth/Throat QID  . enoxaparin (LOVENOX) injection  40 mg Subcutaneous Q24H  . guaiFENesin  1,200 mg Oral BID  . magic mouthwash w/lidocaine  10 mL Oral QID  . scopolamine  1 patch Transdermal Q72H   Continuous Infusions: . sodium chloride 150 mL/hr at 09/17/13 0535  . feeding supplement (VITAL AF 1.2 CAL) 1,000 mL (09/17/13 0759)   PRN Meds:.acetaminophen, docusate sodium, HYDROmorphone (DILAUDID) injection, lidocaine-prilocaine, ondansetron (ZOFRAN) IV, ondansetron (ZOFRAN) IV, ondansetron, ondansetron, oxyCODONE, promethazine, senna-docusate, sucralfate  Objective:  Filed Vitals:   09/16/13 2012  BP: 155/98  Pulse: 88  Temp: 99 F (37.2 C)  Resp: 18    Body mass index is 34.23 kg/(m^2).  Intake/Output Summary (Last 24 hours) at 09/17/13 0824 Last data filed at 09/17/13 0331  Gross per 24 hour  Intake     80 ml  Output    600 ml  Net   -520 ml    GENERAL: alert, no distress and somewhat uncomfortable SKIN: skin color, texture, turgor are normal, no rashes or significant lesions EYES: normal, conjunctiva are  pink and non-injected, sclera clear OROPHARYNX:no exudate, no erythema and lips, buccal mucosa, and tongue normal. No mucositis NECK: supple, thyroid normal size, non-tender, without nodularity LYMPH:  Persistent palpable lymphadenopathy in the neck, improved compared to baseline. LUNGS: clear to auscultation and percussion with normal breathing effort HEART: regular rate & rhythm and no murmurs and no lower extremity edema ABDOMEN: abdomen soft, non-tender and normal bowel sounds. Feeding tube normal.  Musculoskeletal: no cyanosis of digits and no clubbing  PSYCH: alert & oriented x 3 with fluent speech NEURO: no focal motor/sensory deficits    CBG (last 3)   Recent Labs  09/16/13 2359 09/17/13 0406 09/17/13 0749  GLUCAP 107* 156* 133*     Labs:   Recent Labs Lab 09/13/13 0500 09/15/13 0348  WBC 3.8* 3.2*  HGB 11.3* 11.7*  HCT 34.7* 35.6*  PLT 173 140*  MCV 89.0 87.0  MCH 29.0 28.6  MCHC 32.6 32.9  RDW 12.7 12.5  LYMPHSABS 0.3* 0.4*  MONOABS 0.4 0.3  EOSABS 0.1 0.1  BASOSABS 0.0 0.0     Chemistries:    Recent Labs Lab 09/13/13 0500 09/14/13 0500 09/15/13 0348 09/16/13 0530 09/17/13 0540  NA 137 135* 135* 134* 134*  K 4.4 3.8 3.4* 3.5* 3.2*  CL 99 96 96 94* 91*  CO2 25 26 27 28 24   GLUCOSE 96 90 101* 105* 152*  BUN 25* 31* 31* 26* 23  CREATININE 1.87* 2.12* 2.08* 1.72* 1.67*  CALCIUM 8.1* 8.0* 7.5* 7.2* 7.1*  AST 41*  --   --   --   --  ALT 68*  --   --   --   --   ALKPHOS 75  --   --   --   --   BILITOT 0.3  --   --   --   --      Liver Function Tests:  Recent Labs Lab 09/13/13 0500  AST 41*  ALT 68*  ALKPHOS 75  BILITOT 0.3  PROT 6.3  ALBUMIN 3.0*   CBG:  Recent Labs Lab 09/16/13 1726 09/16/13 2010 09/16/13 2359 09/17/13 0406 09/17/13 0749  GLUCAP 85 88 107* 156* 133*    Assessment/Plan:  #1 locally advanced tonsil cancer  The patient is not able to tolerate outpatient management. He can continue radiation therapy  while hospitalized to start today again. On 6/1 had to be placed on hold due to vomiting. Appreciate Dr. Pearlie Oyster (Radiation Oncology)  follow up   #2 uncontrolled nausea and vomiting with gagging  Patient reports that his mucus buildup cause these symptoms He was placed on  IV fluids and IV anti-emetics around the lock with promethazine IV Q6, zofran IV Q8 prn as this has worked well in prior admission.  Mucinex was increased on 6/1 to decrease gagging.  Scopolamine was tried on 6/2, but discontinued due to dizziness and dry mouth as side effects. Scheduled promethazine may be able to be restarted, was placed on hold to decrease chances of sedation. I plan to add daily dexamethasone for 2 days to see if this can help with his nausea. Patient complains that feeding tube is contributing to nausea. Will need nutritionist input for rate control.   #3 severe dehydration with recent renal failure  Secondary to severe vomiting and dehydration Placed on IV fluid resuscitation, plan to continue the same at 150 ml/hour. Renal failure improving.  #4 weight loss with malnutrition  Nutrition service consultation appreciated. PEG tube feeding has been increased to goal 90 ml/hr.Patient complains that feeding tube is contributing to nausea. Will need nutritionist input for rate control.  Total parenteral nutrition to be considered if he continues to have nausea and vomiting.  #5 DVT prophylaxis  On Lovenox. Of note, patient refused 5/30 evening dose. Importance of Lovenox injection to reduce risk of thrombosis was discussed. He agreed to continue Lovenox injections daily.  #6 Hypoglycemia Due to decreased oral intake. Monitor closely  #7 Fever Likely reactive to inflammation and pain. May need panculture to rule out infection if not resolved. Antipyretics recommended at this time.   #7 CODE STATUS  Full code    **Disclaimer: This note was dictated with voice recognition software. Similar sounding  words can inadvertently be transcribed and this note may contain transcription errors which may not have been corrected upon publication of note.Rondel Jumbo, PA-C 09/17/2013  8:24 AM [name], MD 09/17/2013

## 2013-09-17 NOTE — Progress Notes (Signed)
Visited patient in [place] to provide support and encouragement.  He reported that he continues to have repeated N&V.  He stated he wants to proceed with RT today but sometime after lunch; Tomo is aware.  Spoke with patient's RN, encouraged administration of antiemetic medication shortly before RT.  She acknowledged.  Continuing navigation as L1 patient (new patient).  Gayleen Orem, RN, BSN, [facility] Navigator [phone]

## 2013-09-17 NOTE — Telephone Encounter (Signed)
Called patient's fiancee, Servando Salina, to inform that her that Dr. Isidore Moos had completed her disability documentation.  She accepted my offer to fax paperwork to [employer].  I returned original and copy of fax receipt to patient's room for her retrieval.  Gayleen Orem, RN, BSN, [place] Navigator [phone]

## 2013-09-17 NOTE — Clinical Documentation Improvement (Signed)
Please clarify acuity of renal failure. Thank you.   Possible Clinical Conditions?   Acute Renal Failure/Acute Kidney Injury Acute on Chronic Renal Failure Chronic Renal Failure Other Condition Cannot Clinically Determine   Supporting Information: Risk Factors: H&P: severe dehydration with recent renal failure  I will recommend continuous IV fluid resuscitation 5/29: severe dehydration with recent renal failure secondary to severe vomiting and dehydration 5/30: ARI: creatinine worse today-- will increase IVF and follow 5/31: ARI: creatinine slightly improved-- continue IVF support at current rate  6/1 & 6/2: Renal failure improving  Signs and Symptoms: Dehydration  Diagnostics: BUN/CR/GFR 5/14 = 18/1.25/78 5/29 = 25/1.87/48 5/30 = 31/2.12/41 5/31 = 31/2.08/42 6/1 = 26/1.72/53 6/2 = 23/1.67/55  Treatments: 5/29: Continue IV fluid resuscitation  IV NS @ 112ml/h BMP monitoring    Thank You, Estella Husk ,RN Clinical Documentation Specialist:  [facility]

## 2013-09-18 ENCOUNTER — Ambulatory Visit: Payer: Medicaid Other

## 2013-09-18 ENCOUNTER — Encounter: Payer: Self-pay | Admitting: *Deleted

## 2013-09-18 LAB — GLUCOSE, CAPILLARY
GLUCOSE-CAPILLARY: 104 mg/dL — AB (ref 70–99)
GLUCOSE-CAPILLARY: 110 mg/dL — AB (ref 70–99)
GLUCOSE-CAPILLARY: 89 mg/dL (ref 70–99)
GLUCOSE-CAPILLARY: 99 mg/dL (ref 70–99)
GLUCOSE-CAPILLARY: 99 mg/dL (ref 70–99)

## 2013-09-18 LAB — BASIC METABOLIC PANEL
BUN: 30 mg/dL — ABNORMAL HIGH (ref 6–23)
CHLORIDE: 96 meq/L (ref 96–112)
CO2: 25 mEq/L (ref 19–32)
CREATININE: 1.94 mg/dL — AB (ref 0.50–1.35)
Calcium: 7.3 mg/dL — ABNORMAL LOW (ref 8.4–10.5)
GFR calc non Af Amer: 40 mL/min — ABNORMAL LOW (ref >90)
GFR, EST AFRICAN AMERICAN: 46 mL/min — AB (ref >90)
Glucose, Bld: 120 mg/dL — ABNORMAL HIGH (ref 70–99)
POTASSIUM: 3.7 meq/L (ref 3.7–5.3)
Sodium: 134 mEq/L — ABNORMAL LOW (ref 137–147)

## 2013-09-18 MED ORDER — AMLODIPINE BESYLATE 5 MG PO TABS
5.0000 mg | ORAL_TABLET | Freq: Every day | ORAL | Status: DC
  Administered 2013-09-18: 5 mg via ORAL
  Filled 2013-09-18 (×2): qty 1

## 2013-09-18 MED ORDER — PRO-STAT SUGAR FREE PO LIQD
30.0000 mL | Freq: Four times a day (QID) | ORAL | Status: DC
  Administered 2013-09-18 – 2013-09-22 (×6): 30 mL
  Filled 2013-09-18 (×23): qty 30

## 2013-09-18 MED ORDER — VITAL 1.5 CAL PO LIQD
1000.0000 mL | ORAL | Status: DC
  Administered 2013-09-18 – 2013-09-24 (×7): 1000 mL
  Filled 2013-09-18 (×9): qty 1000

## 2013-09-18 NOTE — Progress Notes (Signed)
NUTRITION FOLLOW UP  Intervention: -Recommend to trial Vital 1.5 at 50 ml/hr to provide 1800 kcal, 81 gram protein. Provide Pro-Stat QID to provide 400 kcal, 60 gram protein -TF regimen will provide 2200 kcal (85% est kcal needs), 141 gram protein (100% est protein needs), 920 ml free water. Will require 315 ml flush Q4H to meet total fluid needs once IVF d/c -Continue TF regimen until pt able to meet >/=50% of estimated nutrition needs by mouth    Nutrition Dx:   Inadequate oral intake related to persistent nausea/vomiting as evidenced by pt report of frequent emesis after any intake-ongoing   Goal:   Pt to meet >/=90% of estimated nutrition needs -progressing with TF   Monitor:   TF tolerance, weight trend, labs, GI profile, total protein/energy intake   Assessment:   5/28: - Pt had PEG placed on 08/15/13  - Pt received cycle 2 chemotherapy on 09/10/2013  - Pt followed by [place] has lost 30 lbs x 1 month (9.1%), which is severe for time frame  - Pt reports poor PO intake x 1 month; dietary intake includes things like pudding, grits, jello, Ensure  - Pt reports using his PEG tube for the first time last night and he put 1 Ensure Plus and water in the tube. Pt reports that he vomited after using the tube  - Pt still very nauseous at time of assessment  - Recommend using PEG for sole source of nutrition until pt able to meet >/=50% of minimum estimated needs by PO intake  No muscle or subcutaneous fat depletion noticed.  K, Mg WNL  5/29: -Pt has refused initiation of TF d/t ongoing nausea and vomiting episodes. RN reported pt refused last night and this morning as he had 3 episodes emesis this AM. -Per discussion with RN, pt has only tolerated sips of gingerale -MD noted pt with mucous and gagging that exacerbates GI symptoms. Receiving Mucinex and phenergan. Possible TPN candidate if continues with intractable n/v and intolerance of EN. -Modified TF to  start at lower trickle feed of 10 ml/hr to possibly assist in improving tolerance. Recommend Phos/Mg/K be monitored once pt able to tolerate initiation of TF  6/01: -Vital AF 1.2 currently running at 50 ml/hr, providing 1440 kcal (55% est kcal needs), and 90 gram protein (64% est protein needs). Pt tolerating w/out residuals. Pt refused further TF advancement on 5/31 -IVF running at 150 ml/hr -MD noted pt continues with nausea but no vomiting. Exhibits mucous/phlem/gagging that makes PO intake difficult/nonexistent.  PO intake 0% -Pt agreeable to trial advancement of TF to 60 ml/hr during time of RD follow. Declined any PO beverages. Informed RN to advance TF and assess tolerance.  -Recommend to continue to advance to Vital AF 1.2 to goal of 90 ml/hr as tolerated -K low -Renal function improving with hydration  6/03: -Pt vomited after Vital AF 1.2 was advanced to 70 ml/hr. TF was decreased to 50 ml/hr -Per RN, pt refused to restart TF this morning d/t nausea. MD also noted pt refusing radiation treatments since 6/01 -Discussed TF regimen with pt. He noted some improvement in nausea and is w/out vomiting. 0% PO intake d/t nausea/dry heaves -Pt may benefit from lower volume of more nutrient dense TF -Pt in agreement to modify to higher calorie/protein formula Vital 1.5, with the addition of protein supplement Pro-Stat. -Vital 1.5 at goal rate of 50 ml/hr with Pro-stat QID will provide 85% est kcal needs,  and 100% est protein needs.  -Will monitor TF tolerance and modify protein supplements as needed  Height: Ht Readings from Last 1 Encounters:  09/12/13 6\' 7"  (2.007 m)    Weight Status:   Wt Readings from Last 1 Encounters:  09/17/13 304 lb (137.893 kg)  09/12/13 298 lbs  Re-estimated needs:  Kcal: 2600 - 2800  Protein: 140 - 160 grams  Fluid: 2.8 L fluid    Skin: chest incision, abdomen incision    Diet Order: Full Liquid   Intake/Output Summary (Last 24 hours) at 09/18/13  1300 Last data filed at 09/17/13 1800  Gross per 24 hour  Intake   2100 ml  Output     50 ml  Net   2050 ml    Last BM: 5/31   Labs:   Recent Labs Lab 09/16/13 0530 09/17/13 0540 09/18/13 0528  NA 134* 134* 134*  K 3.5* 3.2* 3.7  CL 94* 91* 96  CO2 28 24 25   BUN 26* 23 30*  CREATININE 1.72* 1.67* 1.94*  CALCIUM 7.2* 7.1* 7.3*  GLUCOSE 105* 152* 120*    CBG (last 3)   Recent Labs  09/17/13 2359 09/18/13 0406 09/18/13 0810  GLUCAP 112* 104* 110*    Scheduled Meds: . chlorhexidine  15 mL Mouth/Throat QID  . dexamethasone  8 mg Intravenous Daily  . enoxaparin (LOVENOX) injection  40 mg Subcutaneous Q24H  . guaiFENesin  1,200 mg Oral BID  . magic mouthwash w/lidocaine  10 mL Oral QID  . promethazine  25 mg Intravenous Q6H    Continuous Infusions: . sodium chloride 150 mL/hr at 09/18/13 1207  . feeding supplement (VITAL AF 1.2 CAL) 1,000 mL (09/17/13 0800)    Atlee Abide MS RD LDN Clinical Dietitian [phone]

## 2013-09-18 NOTE — Progress Notes (Signed)
Visited patient to provide support and encouragement.  He was sleeping.  Will continue to follow during his admission.  Gayleen Orem, RN, BSN, [facility] On[facility] Navigator [phone]

## 2013-09-18 NOTE — Progress Notes (Signed)
   Weekly Management Note:  inpatient Current Dose:  34 Gy Projected Dose: 70 Gy   Narrative:  The patient presents for routine under treatment assessment.  CBCT/MVCT images/Port film x-rays were reviewed.  The chart was checked.  Still nauseous. Reports vomiting last night. Does not think he can lie down for RT without vomiting.  PATIENT HAS ULTIMATELY REFUSED Radiation MON, TUES, and TODAY.   Physical Findings:  height is 6\' 7"  (2.007 m) and weight is 304 lb (137.893 kg). His oral temperature is 98.4 F (36.9 C). His blood pressure is 153/112 and his pulse is 111. His respiration is 20 and oxygen saturation is 93%.  dry mucous membranes in mouth, brisk gag reflex, neck skin dry, neck fullness in R level II  CBC    Component Value Date/Time   WBC 3.2* 09/15/2013 0348   WBC 4.2 09/06/2013 1438   RBC 4.09* 09/15/2013 0348   RBC 4.10* 09/06/2013 1438   HGB 11.7* 09/15/2013 0348   HGB 12.0* 09/06/2013 1438   HCT 35.6* 09/15/2013 0348   HCT 36.9* 09/06/2013 1438   PLT 140* 09/15/2013 0348   PLT 295 09/06/2013 1438   MCV 87.0 09/15/2013 0348   MCV 90.0 09/06/2013 1438   MCH 28.6 09/15/2013 0348   MCH 29.2 09/06/2013 1438   MCHC 32.9 09/15/2013 0348   MCHC 32.5 09/06/2013 1438   RDW 12.5 09/15/2013 0348   RDW 13.1 09/06/2013 1438   LYMPHSABS 0.4* 09/15/2013 0348   LYMPHSABS 0.6* 09/06/2013 1438   MONOABS 0.3 09/15/2013 0348   MONOABS 0.6 09/06/2013 1438   EOSABS 0.1 09/15/2013 0348   EOSABS 0.1 09/06/2013 1438   BASOSABS 0.0 09/15/2013 0348   BASOSABS 0.0 09/06/2013 1438     CMP     Component Value Date/Time   NA 134* 09/18/2013 0528   NA 137 09/06/2013 1438   K 3.7 09/18/2013 0528   K 4.7 09/06/2013 1438   CL 96 09/18/2013 0528   CO2 25 09/18/2013 0528   CO2 29 09/06/2013 1438   GLUCOSE 120* 09/18/2013 0528   GLUCOSE 77 09/06/2013 1438   BUN 30* 09/18/2013 0528   BUN 10.6 09/06/2013 1438   CREATININE 1.94* 09/18/2013 0528   CREATININE 1.2 09/06/2013 1438   CALCIUM 7.3* 09/18/2013 0528   CALCIUM 9.5 09/06/2013  1438   PROT 6.3 09/13/2013 0500   PROT 7.2 09/06/2013 1438   ALBUMIN 3.0* 09/13/2013 0500   ALBUMIN 3.4* 09/06/2013 1438   AST 41* 09/13/2013 0500   AST 31 09/06/2013 1438   ALT 68* 09/13/2013 0500   ALT 46 09/06/2013 1438   ALKPHOS 75 09/13/2013 0500   ALKPHOS 83 09/06/2013 1438   BILITOT 0.3 09/13/2013 0500   BILITOT 0.34 09/06/2013 1438   GFRNONAA 40* 09/18/2013 0528   GFRAA 46* 09/18/2013 0528     Impression:  The patient is refusing radiotherapy due to nausea. Appreciate med/onc's efforts to manage this.  Plan:  Continue radiotherapy as planned as soon as patient is willing. I explained to him that the more treatments he misses,   the lower his chance of cure. I strongly urged him to attempt radiotherapy today. He adamantly refuses. He says he will do everything he can to attempt radiotherapy tomorrow. I will let my staff know. -----------------------------------  Eppie Gibson, MD

## 2013-09-18 NOTE — Progress Notes (Signed)
Patient refused tube feeding from  08:00 am to 1800. Restarted new feeding formula.Patient spits up when coughing but no actual vomiting noted.

## 2013-09-18 NOTE — Progress Notes (Signed)
Mason [name]   DOB:29-Sep-1965   BO#:[id number]   IDP#:[id number]  I have seen the patient, examined him and edited the notes as follows   Subjective: He continues to be nauseous, but no vomiting is present. He attributes his nausea to the feeding tube, which was initiated over the weekend. Awaiting nutrition input.  Nutritional feeding was discontinued yesterday.  In addition, he complains of increased bilateral lower extremity swelling without calf tenderness. This swelling began overnight. Denies any respiratory or cardiac complaints. Appetite diminished due to nausea/ dry heaves. Last bowel movement on 6/1 without blood in the stools. No confusion. He has refused radiation since 6/1. Afebrile this morning.   Scheduled Meds: . chlorhexidine  15 mL Mouth/Throat QID  . dexamethasone  8 mg Intravenous Daily  . enoxaparin (LOVENOX) injection  40 mg Subcutaneous Q24H  . guaiFENesin  1,200 mg Oral BID  . magic mouthwash w/lidocaine  10 mL Oral QID  . promethazine  25 mg Intravenous Q6H   Continuous Infusions: . sodium chloride 150 mL/hr at 09/18/13 0536  . feeding supplement (VITAL AF 1.2 CAL) 1,000 mL (09/17/13 0800)   PRN Meds:.acetaminophen, docusate sodium, HYDROmorphone (DILAUDID) injection, lidocaine-prilocaine, ondansetron (ZOFRAN) IV, ondansetron (ZOFRAN) IV, ondansetron, ondansetron, oxyCODONE, senna-docusate, sucralfate  Objective:  Filed Vitals:   09/18/13 0545  BP: 153/112  Pulse: 111  Temp: 98.4 F (36.9 C)  Resp:     Body mass index is 34.23 kg/(m^2).  Intake/Output Summary (Last 24 hours) at 09/18/13 0724 Last data filed at 09/17/13 1800  Gross per 24 hour  Intake   2350 ml  Output    700 ml  Net   1650 ml    GENERAL: alert, no distress and uncomfortable due to nausea SKIN: skin color, texture, turgor are normal, no rashes or significant lesions EYES: normal, conjunctiva are pink and non-injected, sclera clear OROPHARYNX: no exudate, no erythema and lips,  buccal mucosa, and tongue normal. No mucositis NECK: supple, thyroid normal size, non-tender, without nodularity LYMPH:  Persistent palpable lymphadenopathy in the neck, improved compared to baseline. LUNGS: clear to auscultation and percussion with normal breathing effort HEART: regular rate & rhythm and no murmurs with moderate bilateral lower extremity edema, worse at the pedal area, where has more pitting.  ABDOMEN: abdomen soft, non-tender and normal bowel sounds. Feeding tube normal. Palpation to the right side of the tube reproduces nausea.  Musculoskeletal: no cyanosis of digits and no clubbing  PSYCH: alert & oriented x 3 with fluent speech NEURO: no focal motor/sensory deficits    CBG (last 3)   Recent Labs  09/17/13 1557 09/17/13 2359 09/18/13 0406  GLUCAP 109* 112* 104*     Labs:   Recent Labs Lab 09/13/13 0500 09/15/13 0348  WBC 3.8* 3.2*  HGB 11.3* 11.7*  HCT 34.7* 35.6*  PLT 173 140*  MCV 89.0 87.0  MCH 29.0 28.6  MCHC 32.6 32.9  RDW 12.7 12.5  LYMPHSABS 0.3* 0.4*  MONOABS 0.4 0.3  EOSABS 0.1 0.1  BASOSABS 0.0 0.0     Chemistries:    Recent Labs Lab 09/13/13 0500 09/14/13 0500 09/15/13 0348 09/16/13 0530 09/17/13 0540 09/18/13 0528  NA 137 135* 135* 134* 134* 134*  K 4.4 3.8 3.4* 3.5* 3.2* 3.7  CL 99 96 96 94* 91* 96  CO2 25 26 27 28 24 25   GLUCOSE 96 90 101* 105* 152* 120*  BUN 25* 31* 31* 26* 23 30*  CREATININE 1.87* 2.12* 2.08* 1.72* 1.67* 1.94*  CALCIUM 8.1* 8.0*  7.5* 7.2* 7.1* 7.3*  AST 41*  --   --   --   --   --   ALT 68*  --   --   --   --   --   ALKPHOS 75  --   --   --   --   --   BILITOT 0.3  --   --   --   --   --      Liver Function Tests:  Recent Labs Lab 09/13/13 0500  AST 41*  ALT 68*  ALKPHOS 75  BILITOT 0.3  PROT 6.3  ALBUMIN 3.0*   CBG:  Recent Labs Lab 09/17/13 0749 09/17/13 1155 09/17/13 1557 09/17/13 2359 09/18/13 0406  GLUCAP 133* 112* 109* 112* 104*    Assessment/Plan:  #1 locally  advanced tonsil cancer  The patient is not able to tolerate outpatient management. He can continue radiation therapy while hospitalized to start today again. On 6/1 had to be placed on hold due to vomiting. He continues to refuse radiation due to symptoms. I reinforced the importance of radiation to the patient.  Appreciate Dr. Pearlie Oyster (Radiation Oncology)  follow up   #2 uncontrolled nausea and vomiting with gagging  Patient reports that his mucus buildup cause these symptoms He was placed on  IV fluids and IV anti-emetics around the lock with promethazine IV Q6, zofran IV Q8 prn as this has worked well in prior admission.  Mucinex was increased on 6/1 to decrease gagging.  Scopolamine was tried on 6/2, but discontinued due to dizziness and dry mouth as side effects. Scheduled promethazine restarted  Daily dexamethasone was added for 2 days to see if this can help with his nausea. Patient complains that feeding tube is contributing to nausea. Will need nutritionist input for rate control.   His tube feeding has been placed on hold for now.  #3 severe dehydration with recent renal failure  Secondary to severe vomiting and dehydration Placed on IV fluid resuscitation, plan to continue the same at 150 ml/hour. Renal failure has worsened today, with BUN and Creatinine increasing. Sodium remains same. May be related to steroid initiation. Monitor closely.   #4 weight loss with malnutrition  Nutrition service consultation appreciated. PEG tube feeding has been increased to goal 90 ml/hr, then placed on hold. Patient complains that feeding tube is contributing to nausea. Will need nutritionist input for rate control.  Total parenteral nutrition to be considered if he continues to have nausea and vomiting and patient may not be able to tolerate tube feeding..  #5 DVT prophylaxis  On Lovenox. Of note, patient refused 5/30 evening dose. Importance of Lovenox injection to reduce risk of thrombosis was  discussed. He agreed to continue Lovenox injections daily.  #6 Hypoglycemia Due to decreased oral intake. Stable today Monitor closely  #7 Fever Likely reactive to inflammation and pain.  Resolved with antipyretics.  #8 Bilateral lower extremity swelling This is acute. May be exacerbated by IV fluids, malnutrition and decreased ambulation. Low suspicion for clot as patient is on Lovenox anticoagulation.  Elevate legs.  Will decrease IV fluids once renal function improves. I will recheck serum albumin tomorrow.   #9 CODE STATUS  Full code    **Disclaimer: This note was dictated with voice recognition software. Similar sounding words can inadvertently be transcribed and this note may contain transcription errors which may not have been corrected upon publication of note.Rondel Jumbo, PA-C 09/18/2013  7:24 AM  Mason Lark, MD 09/18/2013

## 2013-09-19 ENCOUNTER — Ambulatory Visit
Admission: RE | Admit: 2013-09-19 | Discharge: 2013-09-19 | Disposition: A | Discharge status: Home / Self Care | Payer: Medicaid Other | Source: Ambulatory Visit | Attending: Radiation Oncology | Admitting: Radiation Oncology

## 2013-09-19 ENCOUNTER — Inpatient Hospital Stay (HOSPITAL_COMMUNITY): Payer: Medicaid Other

## 2013-09-19 DIAGNOSIS — D649 Anemia, unspecified: Secondary | ICD-10-CM

## 2013-09-19 DIAGNOSIS — Z7901 Long term (current) use of anticoagulants: Secondary | ICD-10-CM

## 2013-09-19 DIAGNOSIS — M7989 Other specified soft tissue disorders: Secondary | ICD-10-CM

## 2013-09-19 DIAGNOSIS — I10 Essential (primary) hypertension: Secondary | ICD-10-CM

## 2013-09-19 LAB — COMPREHENSIVE METABOLIC PANEL
ALBUMIN: 3 g/dL — AB (ref 3.5–5.2)
ALT: 100 U/L — ABNORMAL HIGH (ref 0–53)
AST: 42 U/L — AB (ref 0–37)
Alkaline Phosphatase: 79 U/L (ref 39–117)
BILIRUBIN TOTAL: 0.3 mg/dL (ref 0.3–1.2)
BUN: 34 mg/dL — AB (ref 6–23)
CALCIUM: 7.7 mg/dL — AB (ref 8.4–10.5)
CO2: 25 mEq/L (ref 19–32)
CREATININE: 1.89 mg/dL — AB (ref 0.50–1.35)
Chloride: 100 mEq/L (ref 96–112)
GFR calc Af Amer: 48 mL/min — ABNORMAL LOW (ref >90)
GFR calc non Af Amer: 41 mL/min — ABNORMAL LOW (ref >90)
Glucose, Bld: 104 mg/dL — ABNORMAL HIGH (ref 70–99)
Potassium: 4 mEq/L (ref 3.7–5.3)
Sodium: 138 mEq/L (ref 137–147)
TOTAL PROTEIN: 6.2 g/dL (ref 6.0–8.3)

## 2013-09-19 LAB — CBC WITH DIFFERENTIAL/PLATELET
Basophils Absolute: 0 10*3/uL (ref 0.0–0.1)
Basophils Relative: 0 % (ref 0–1)
EOS PCT: 0 % (ref 0–5)
Eosinophils Absolute: 0 10*3/uL (ref 0.0–0.7)
HEMATOCRIT: 33.3 % — AB (ref 39.0–52.0)
HEMOGLOBIN: 10.9 g/dL — AB (ref 13.0–17.0)
Lymphocytes Relative: 10 % — ABNORMAL LOW (ref 12–46)
Lymphs Abs: 0.5 10*3/uL — ABNORMAL LOW (ref 0.7–4.0)
MCH: 28.5 pg (ref 26.0–34.0)
MCHC: 32.7 g/dL (ref 30.0–36.0)
MCV: 86.9 fL (ref 78.0–100.0)
MONO ABS: 0.5 10*3/uL (ref 0.1–1.0)
MONOS PCT: 10 % (ref 3–12)
NEUTROS ABS: 3.5 10*3/uL (ref 1.7–7.7)
Neutrophils Relative %: 80 % — ABNORMAL HIGH (ref 43–77)
PLATELETS: 91 10*3/uL — AB (ref 150–400)
RBC: 3.83 MIL/uL — AB (ref 4.22–5.81)
RDW: 12.8 % (ref 11.5–15.5)
WBC: 4.5 10*3/uL (ref 4.0–10.5)

## 2013-09-19 LAB — GLUCOSE, CAPILLARY
GLUCOSE-CAPILLARY: 106 mg/dL — AB (ref 70–99)
GLUCOSE-CAPILLARY: 106 mg/dL — AB (ref 70–99)
GLUCOSE-CAPILLARY: 109 mg/dL — AB (ref 70–99)
Glucose-Capillary: 91 mg/dL (ref 70–99)
Glucose-Capillary: 109 mg/dL — ABNORMAL HIGH (ref 70–99)
Glucose-Capillary: 107 mg/dL — ABNORMAL HIGH (ref 70–99)

## 2013-09-19 MED ORDER — LORAZEPAM 2 MG/ML IJ SOLN
1.0000 mg | Freq: Every day | INTRAMUSCULAR | Status: DC | PRN
  Administered 2013-09-24 – 2013-10-04 (×8): 1 mg via INTRAVENOUS
  Filled 2013-09-19 (×9): qty 1

## 2013-09-19 MED ORDER — AMLODIPINE BESYLATE 10 MG PO TABS
10.0000 mg | ORAL_TABLET | Freq: Every day | ORAL | Status: DC
  Administered 2013-09-19 – 2013-10-04 (×16): 10 mg via ORAL
  Filled 2013-09-19 (×16): qty 1

## 2013-09-19 NOTE — Progress Notes (Signed)
[facility] Therapy Treatment Record Phone [phone]   Radiation Therapy was administered to Mason [name] on: 09/19/2013  2:55 PM and was treatment # 18 out of a planned [name] of 35 treatments.

## 2013-09-19 NOTE — Progress Notes (Signed)
Patient taken down to radiology for CT of head. Just returned to floor at 1600 from radiation oncology. After CT scan, radiology staff took patient to [facility] for radiation treatment. I was not notified that radiology was taking him to radiation and he did not receive dose of IV Ativan prior to Radiation Treatment. I encouraged him to request the Ativan if transport came to take him to radiation without getting Ativan.  Patient did state that he tolerated treatment fairly well without the Ativan.Reeanna Acri Mallie Snooks

## 2013-09-19 NOTE — Progress Notes (Signed)
NUTRITION FOLLOW UP  Intervention: -Continue to advance Vital 1.5 by 10 ml Q4H to goal rate of 50 ml/hr to provide 1800 kcal, 81 gram protein. Provide Pro-Stat QID to provide 400 kcal, 60 gram protein -TF regimen will provide 2200 kcal (85% est kcal needs), 141 gram protein (100% est protein needs), 920 ml free water. Will require 315 ml flush Q4H to meet total fluid needs once IVF d/c   Nutrition Dx:   Inadequate oral intake related to persistent nausea/vomiting as evidenced by pt report of frequent emesis after any intake-ongoing   Goal:   Pt to meet >/=90% of estimated nutrition needs -progressing with TF   Monitor:   TF tolerance, weight trend, labs, GI profile, total protein/energy intake   Assessment:   5/28: - Pt had PEG placed on 08/15/13  - Pt received cycle 2 chemotherapy on 09/10/2013  - Pt followed by [facility] has lost 30 lbs x 1 month (9.1%), which is severe for time frame  - Pt reports poor PO intake x 1 month; dietary intake includes things like pudding, grits, jello, Ensure  - Pt reports using his PEG tube for the first time last night and he put 1 Ensure Plus and water in the tube. Pt reports that he vomited after using the tube  - Pt still very nauseous at time of assessment  - Recommend using PEG for sole source of nutrition until pt able to meet >/=50% of minimum estimated needs by PO intake  No muscle or subcutaneous fat depletion noticed.  K, Mg WNL  5/29: -Pt has refused initiation of TF d/t ongoing nausea and vomiting episodes. RN reported pt refused last night and this morning as he had 3 episodes emesis this AM. -Per discussion with RN, pt has only tolerated sips of gingerale -MD noted pt with mucous and gagging that exacerbates GI symptoms. Receiving Mucinex and phenergan. Possible TPN candidate if continues with intractable n/v and intolerance of EN. -Modified TF to start at lower trickle feed of 10 ml/hr to possibly assist in  improving tolerance. Recommend Phos/Mg/K be monitored once pt able to tolerate initiation of TF  6/01: -Vital AF 1.2 currently running at 50 ml/hr, providing 1440 kcal (55% est kcal needs), and 90 gram protein (64% est protein needs). Pt tolerating w/out residuals. Pt refused further TF advancement on 5/31 -IVF running at 150 ml/hr -MD noted pt continues with nausea but no vomiting. Exhibits mucous/phlem/gagging that makes PO intake difficult/nonexistent.  PO intake 0% -Pt agreeable to trial advancement of TF to 60 ml/hr during time of RD follow. Declined any PO beverages. Informed RN to advance TF and assess tolerance.  -Recommend to continue to advance to Vital AF 1.2 to goal of 90 ml/hr as tolerated -K low -Renal function improving with hydration  6/03: -Pt vomited after Vital AF 1.2 was advanced to 70 ml/hr. TF was decreased to 50 ml/hr -Per RN, pt refused to restart TF this morning d/t nausea. MD also noted pt refusing radiation treatments since 6/01 -Discussed TF regimen with pt. He noted some improvement in nausea and is w/out vomiting. 0% PO intake d/t nausea/dry heaves -Pt may benefit from lower volume of more nutrient dense TF -Pt in agreement to modify to higher calorie/protein formula Vital 1.5, with the addition of protein supplement Pro-Stat. -Vital 1.5 at goal rate of 50 ml/hr with Pro-stat QID will provide 85% est kcal needs, and 100% est protein needs.  -Will monitor TF  tolerance and modify protein supplements as needed  6/04: -Vital 1.5 currently running at 30 ml/hr. This provides 1080 kcal (42% est kcal needs), and 49 gram protein (35% est protein needs). Received one Pro-Stat yesterday and tolerated w/out nausea -Pt reported much improvement in nausea with TF adjustments. Was willing to continue with increase to goal rate of 50 ml/hr after radiation treatment today at 3pm -Discussed TF with RN. Encouraged to administer Pro-Stat via PEG to minimize nausea -0% PO intake -MD  noted pt's elevated BUN/Crt may be related to steroids. Continuing with IVF at 150 ml/hr. -Will monitor TF advancement and increase Pro-Stat as warranted  Height: Ht Readings from Last 1 Encounters:  09/12/13 6\' 7"  (2.007 m)    Weight Status:   Wt Readings from Last 1 Encounters:  09/19/13 319 lb 6.4 oz (144.879 kg)  09/12/13 298 lbs  Re-estimated needs:  Kcal: 2600 - 2800  Protein: 140 - 160 grams  Fluid: 2.8 L fluid    Skin: chest incision, abdomen incision    Diet Order: Full Liquid   Intake/Output Summary (Last 24 hours) at 09/19/13 1000 Last data filed at 09/18/13 2035  Gross per 24 hour  Intake     60 ml  Output      0 ml  Net     60 ml    Last BM: 5/31   Labs:   Recent Labs Lab 09/17/13 0540 09/18/13 0528 09/19/13 0510  NA 134* 134* 138  K 3.2* 3.7 4.0  CL 91* 96 100  CO2 24 25 25   BUN 23 30* 34*  CREATININE 1.67* 1.94* 1.89*  CALCIUM 7.1* 7.3* 7.7*  GLUCOSE 152* 120* 104*    CBG (last 3)   Recent Labs  09/19/13 0029 09/19/13 0500 09/19/13 0754  GLUCAP 109* 91 109*    Scheduled Meds: . amLODipine  10 mg Oral Daily  . chlorhexidine  15 mL Mouth/Throat QID  . dexamethasone  8 mg Intravenous Daily  . enoxaparin (LOVENOX) injection  40 mg Subcutaneous Q24H  . feeding supplement (PRO-STAT SUGAR FREE 64)  30 mL Per Tube QID  . guaiFENesin  1,200 mg Oral BID  . magic mouthwash w/lidocaine  10 mL Oral QID  . promethazine  25 mg Intravenous Q6H    Continuous Infusions: . sodium chloride 150 mL/hr at 09/19/13 0636  . feeding supplement (VITAL 1.5 CAL) 1,000 mL (09/18/13 1801)    Atlee Abide MS RD LDN Clinical Dietitian [phone]

## 2013-09-19 NOTE — Progress Notes (Signed)
Called back to treatment machine linac#4,patientin dressing room requesting  his mouth rinse, informed him we don't carry that in out patient clinic, asked if he had any in his room #1305, offered to go and get that for him, he said yes, I need it to be able to lay on that  Hard table for rad tx, to get that thick saliva out of my mouth,, went and retrieved packet of biotene rinse form the patient's room and gave to the patient ,patient thanked this Rn for going for this for him 2:23 PM

## 2013-09-19 NOTE — Progress Notes (Signed)
Mason [name]   DOB:10/14/1965   RX#:[id number]   PJK#:[id number] I have seen the patient, examined him and edited the notes as follows  Subjective: His nausea improved significantly upon tube feeding adjustments. He had one episode of vomiting last night without recurrence. He admits to have strong gag reflex prior to hospitalization. He is trying to increase his oral intake, with small bites at a time. His bilateral lower extremity swelling is improved.  Denies any respiratory or cardiac complaints.  Last bowel movement on 6/2 without blood in the stools. No confusion. He has refused radiation since 6/1 but willing to try today as symptoms are improving. Afebrile this morning.   Scheduled Meds: . amLODipine  5 mg Oral Daily  . chlorhexidine  15 mL Mouth/Throat QID  . dexamethasone  8 mg Intravenous Daily  . enoxaparin (LOVENOX) injection  40 mg Subcutaneous Q24H  . feeding supplement (PRO-STAT SUGAR FREE 64)  30 mL Per Tube QID  . guaiFENesin  1,200 mg Oral BID  . magic mouthwash w/lidocaine  10 mL Oral QID  . promethazine  25 mg Intravenous Q6H   Continuous Infusions: . sodium chloride 150 mL/hr at 09/19/13 0636  . feeding supplement (VITAL 1.5 CAL) 1,000 mL (09/18/13 1801)   PRN Meds:.acetaminophen, docusate sodium, HYDROmorphone (DILAUDID) injection, lidocaine-prilocaine, ondansetron (ZOFRAN) IV, ondansetron (ZOFRAN) IV, ondansetron, ondansetron, oxyCODONE, senna-docusate, sucralfate  Objective:  Filed Vitals:   09/19/13 0500  BP: 150/114  Pulse: 111  Temp: 99 F (37.2 C)  Resp: 20    Body mass index is 35.97 kg/(m^2).  Intake/Output Summary (Last 24 hours) at 09/19/13 0748 Last data filed at 09/18/13 2035  Gross per 24 hour  Intake     60 ml  Output    400 ml  Net   -340 ml    GENERAL: alert, no distress and more comfortable this morning due to less nausea. SKIN: skin color, texture, turgor are normal, no rashes or significant lesions EYES: normal, conjunctiva are  pink and non-injected, sclera clear OROPHARYNX: no exudate, no erythema and lips, buccal mucosa, and tongue normal. No mucositis NECK: supple, thyroid normal size, non-tender, without nodularity LYMPH:  Persistent palpable lymphadenopathy in the neck, improved compared to baseline. LUNGS: clear to auscultation and percussion with normal breathing effort HEART: regular rate & rhythm and no murmurs with 1 + bilateral lower extremity edema, right worse than left, improved from prior day. ABDOMEN: abdomen soft, non-tender and normal bowel sounds. Feeding tube normal.  Musculoskeletal: no cyanosis of digits and no clubbing  PSYCH: alert & oriented x 3 with fluent speech NEURO: no focal motor/sensory deficits    CBG (last 3)   Recent Labs  09/18/13 2046 09/19/13 0029 09/19/13 0500  GLUCAP 99 109* 91     Labs:   Recent Labs Lab 09/13/13 0500 09/15/13 0348 09/19/13 0510  WBC 3.8* 3.2* 4.5  HGB 11.3* 11.7* 10.9*  HCT 34.7* 35.6* 33.3*  PLT 173 140* 91*  MCV 89.0 87.0 86.9  MCH 29.0 28.6 28.5  MCHC 32.6 32.9 32.7  RDW 12.7 12.5 12.8  LYMPHSABS 0.3* 0.4* 0.5*  MONOABS 0.4 0.3 0.5  EOSABS 0.1 0.1 0.0  BASOSABS 0.0 0.0 0.0     Chemistries:    Recent Labs Lab 09/13/13 0500  09/15/13 0348 09/16/13 0530 09/17/13 0540 09/18/13 0528 09/19/13 0510  NA 137  < > 135* 134* 134* 134* 138  K 4.4  < > 3.4* 3.5* 3.2* 3.7 4.0  CL 99  < > 96  94* 91* 96 100  CO2 25  < > 27 28 24 25 25   GLUCOSE 96  < > 101* 105* 152* 120* 104*  BUN 25*  < > 31* 26* 23 30* 34*  CREATININE 1.87*  < > 2.08* 1.72* 1.67* 1.94* 1.89*  CALCIUM 8.1*  < > 7.5* 7.2* 7.1* 7.3* 7.7*  AST 41*  --   --   --   --   --  42*  ALT 68*  --   --   --   --   --  100*  ALKPHOS 75  --   --   --   --   --  79  BILITOT 0.3  --   --   --   --   --  0.3  < > = values in this interval not displayed.   Liver Function Tests:  Recent Labs Lab 09/13/13 0500 09/19/13 0510  AST 41* 42*  ALT 68* 100*  ALKPHOS 75 79   BILITOT 0.3 0.3  PROT 6.3 6.2  ALBUMIN 3.0* 3.0*   CBG:  Recent Labs Lab 09/18/13 1257 09/18/13 1717 09/18/13 2046 09/19/13 0029 09/19/13 0500  GLUCAP 89 99 99 109* 91    Assessment/Plan:  #1 locally advanced tonsil cancer  The patient is not able to tolerate outpatient management. He can continue radiation therapy while hospitalized to start today again. On 6/1 had to be placed on hold due to vomiting. He continued to refuse radiation due to symptoms. Reinforced the importance of radiation to the patient. He is willing to receive therapy today as symptoms ameliorated.  Appreciate Dr. Pearlie Oyster (Radiation Oncology)  follow up. I recommend lorazepam half an hour before his planned radiation.  #2 uncontrolled nausea and vomiting with gagging  Patient reported that his mucus buildup caused these symptoms He was placed on  IV fluids and IV anti-emetics around the lock with promethazine IV Q6, zofran IV Q8 prn as this has worked well in prior admission.  Mucinex was increased on 6/1 to decrease gagging.  Scopolamine was tried on 6/2, but discontinued due to dizziness and dry mouth as side effects. Scheduled promethazine restarted  Daily dexamethasone was added for 2 days to see if this can help with his nausea. This is helpful. I will extend dexamethasone for a few more days. Patient complained that feeding tube is contributing to nausea. His tube feeding was placed on hold on 6/3, and is to continue today at a lower rate as his nausea is better controlled. I plan to cap the maximum rate to 50 ml per hour. I will proceed to order a CT scan of the head without contrast to exclude brain metastasis.  #3 severe dehydration with recent renal failure  Secondary to severe vomiting and dehydration Placed on IV fluid resuscitation, at 150 ml/hour. Renal failure worsened as of 6/3 with BUN and Creatinine increasing. May be related to steroid initiation.  Monitor closely.   #4 weight loss with  malnutrition  Nutrition service consultation appreciated. PEG tube feeding has been increased to goal 90 ml/hr, then placed on hold. Patient complained that feeding tube was contributing to nausea. His rate was decreased to 30 ml/hr with better tolerance. I plan to cap it at maximum 50 ml per hour. Will continue to monitor. Total parenteral nutrition to be considered if he continues to have nausea and vomiting and patient may not be able to tolerate tube feeding..  #5 DVT prophylaxis  On Lovenox. Of note, patient refused  5/30 evening dose. Importance of Lovenox injection to reduce risk of thrombosis was discussed. He agreed to continue Lovenox injections daily.  #6 Hypoglycemia Due to decreased oral intake. Stable today Monitor closely  #7 Fever Likely reactive to inflammation and pain.  Resolved with antipyretics.  #8 Bilateral lower extremity swelling This is acute. May be exacerbated by IV fluids, malnutrition and decreased ambulation. Low suspicion for clot as patient is on Lovenox anticoagulation.  Improving. Continue to elevate legs.   IV fluids to be decreased once renal function improves. serum albumin unchanged since admission at 3.0.  #9 CODE STATUS  Full code   #10 elevated liver function test Monitor closely. Likely related to recent treatment.  #11 anemia This is likely anemia of chronic disease. The patient denies recent history of bleeding such as epistaxis, hematuria or hematochezia. He is asymptomatic from the anemia. We will observe for now.  He does not require transfusion now.   #12 discharge planning Patient is unlikely able to go home within the next 3-4 days due to ongoing uncontrolled nausea, vomiting and malnutrition.  **Disclaimer: This note was dictated with voice recognition software. Similar sounding words can inadvertently be transcribed and this note may contain transcription errors which may not have been corrected upon publication of note.Rondel Jumbo, PA-C 09/19/2013  7:48 AM  [name], MD 09/19/2013

## 2013-09-20 ENCOUNTER — Ambulatory Visit
Admission: RE | Admit: 2013-09-20 | Discharge: 2013-09-20 | Disposition: A | Discharge status: Home / Self Care | Payer: Medicaid Other | Source: Ambulatory Visit | Attending: Radiation Oncology | Admitting: Radiation Oncology

## 2013-09-20 DIAGNOSIS — R7989 Other specified abnormal findings of blood chemistry: Secondary | ICD-10-CM

## 2013-09-20 DIAGNOSIS — R944 Abnormal results of kidney function studies: Secondary | ICD-10-CM

## 2013-09-20 DIAGNOSIS — K5900 Constipation, unspecified: Secondary | ICD-10-CM

## 2013-09-20 DIAGNOSIS — D638 Anemia in other chronic diseases classified elsewhere: Secondary | ICD-10-CM

## 2013-09-20 LAB — GLUCOSE, CAPILLARY
GLUCOSE-CAPILLARY: 109 mg/dL — AB (ref 70–99)
Glucose-Capillary: 109 mg/dL — ABNORMAL HIGH (ref 70–99)
Glucose-Capillary: 94 mg/dL (ref 70–99)
Glucose-Capillary: 103 mg/dL — ABNORMAL HIGH (ref 70–99)
Glucose-Capillary: 143 mg/dL — ABNORMAL HIGH (ref 70–99)
Glucose-Capillary: 104 mg/dL — ABNORMAL HIGH (ref 70–99)

## 2013-09-20 LAB — COMPREHENSIVE METABOLIC PANEL
ALT: 96 U/L — ABNORMAL HIGH (ref 0–53)
AST: 38 U/L — ABNORMAL HIGH (ref 0–37)
Albumin: 3.1 g/dL — ABNORMAL LOW (ref 3.5–5.2)
Alkaline Phosphatase: 84 U/L (ref 39–117)
BUN: 32 mg/dL — ABNORMAL HIGH (ref 6–23)
CO2: 25 meq/L (ref 19–32)
CREATININE: 1.71 mg/dL — AB (ref 0.50–1.35)
Calcium: 8.2 mg/dL — ABNORMAL LOW (ref 8.4–10.5)
Chloride: 99 mEq/L (ref 96–112)
GFR, EST AFRICAN AMERICAN: 54 mL/min — AB (ref >90)
GFR, EST NON AFRICAN AMERICAN: 46 mL/min — AB (ref >90)
Glucose, Bld: 114 mg/dL — ABNORMAL HIGH (ref 70–99)
Potassium: 3.8 mEq/L (ref 3.7–5.3)
Sodium: 139 mEq/L (ref 137–147)
Total Bilirubin: 0.3 mg/dL (ref 0.3–1.2)
Total Protein: 6.4 g/dL (ref 6.0–8.3)

## 2013-09-20 MED ORDER — SODIUM CHLORIDE 0.9 % IJ SOLN
INTRAMUSCULAR | Status: AC
  Administered 2013-09-20: 10 mL
  Filled 2013-09-20: qty 10

## 2013-09-20 MED ORDER — METOPROLOL TARTRATE 1 MG/ML IV SOLN
5.0000 mg | Freq: Four times a day (QID) | INTRAVENOUS | Status: DC | PRN
  Administered 2013-09-21 – 2013-09-24 (×7): 5 mg via INTRAVENOUS
  Filled 2013-09-20 (×6): qty 5

## 2013-09-20 MED ORDER — HYDRALAZINE HCL 25 MG PO TABS
25.0000 mg | ORAL_TABLET | Freq: Three times a day (TID) | ORAL | Status: DC
  Administered 2013-09-20 – 2013-09-27 (×22): 25 mg via ORAL
  Filled 2013-09-20 (×25): qty 1

## 2013-09-20 NOTE — Plan of Care (Signed)
Problem: Phase II Progression Outcomes Goal: Other Phase II Outcomes/Goals Outcome: Progressing Patient refusing to have tube feeding rate increased from 40 ml/h to 50 ml/h. "Happy with where it is at" per patient. Residuals <10 ml or zero. Patient c/o nausea at times related to mucus in throat and having to cough it out.

## 2013-09-20 NOTE — Plan of Care (Signed)
Problem: Phase III Progression Outcomes Goal: Pain controlled on oral analgesia Outcome: Progressing Patient with relief of pain after prn oxyir given. Still requesting IV dilaudid as well.

## 2013-09-20 NOTE — Progress Notes (Signed)
NUTRITION FOLLOW UP  Intervention: -Continue to advance Vital 1.5 by 10 ml Q4H to goal rate of 50 ml/hr to provide 1800 kcal, 81 gram protein. Provide Pro-Stat QID to provide 400 kcal, 60 gram protein -TF regimen will provide 2200 kcal (85% est kcal needs), 141 gram protein (100% est protein needs), 920 ml free water. Will require 315 ml flush Q4H to meet total fluid needs once IVF d/c   Nutrition Dx:   Inadequate oral intake related to persistent nausea/vomiting as evidenced by pt report of frequent emesis after any intake-ongoing   Goal:   Pt to meet >/=90% of estimated nutrition needs -progressing with TF   Monitor:   TF tolerance, weight trend, labs, GI profile, total protein/energy intake   Assessment:   5/28: - Pt had PEG placed on 08/15/13  - Pt received cycle 2 chemotherapy on 09/10/2013  - Pt followed by [facility] has lost 30 lbs x 1 month (9.1%), which is severe for time frame  - Pt reports poor PO intake x 1 month; dietary intake includes things like pudding, grits, jello, Ensure  - Pt reports using his PEG tube for the first time last night and he put 1 Ensure Plus and water in the tube. Pt reports that he vomited after using the tube  - Pt still very nauseous at time of assessment  - Recommend using PEG for sole source of nutrition until pt able to meet >/=50% of minimum estimated needs by PO intake  No muscle or subcutaneous fat depletion noticed.  K, Mg WNL  5/29: -Pt has refused initiation of TF d/t ongoing nausea and vomiting episodes. RN reported pt refused last night and this morning as he had 3 episodes emesis this AM. -Per discussion with RN, pt has only tolerated sips of gingerale -MD noted pt with mucous and gagging that exacerbates GI symptoms. Receiving Mucinex and phenergan. Possible TPN candidate if continues with intractable n/v and intolerance of EN. -Modified TF to start at lower trickle feed of 10 ml/hr to possibly assist in  improving tolerance. Recommend Phos/Mg/K be monitored once pt able to tolerate initiation of TF  6/01: -Vital AF 1.2 currently running at 50 ml/hr, providing 1440 kcal (55% est kcal needs), and 90 gram protein (64% est protein needs). Pt tolerating w/out residuals. Pt refused further TF advancement on 5/31 -IVF running at 150 ml/hr -MD noted pt continues with nausea but no vomiting. Exhibits mucous/phlem/gagging that makes PO intake difficult/nonexistent.  PO intake 0% -Pt agreeable to trial advancement of TF to 60 ml/hr during time of RD follow. Declined any PO beverages. Informed RN to advance TF and assess tolerance.  -Recommend to continue to advance to Vital AF 1.2 to goal of 90 ml/hr as tolerated -K low -Renal function improving with hydration  6/03: -Pt vomited after Vital AF 1.2 was advanced to 70 ml/hr. TF was decreased to 50 ml/hr -Per RN, pt refused to restart TF this morning d/t nausea. MD also noted pt refusing radiation treatments since 6/01 -Discussed TF regimen with pt. He noted some improvement in nausea and is w/out vomiting. 0% PO intake d/t nausea/dry heaves -Pt may benefit from lower volume of more nutrient dense TF -Pt in agreement to modify to higher calorie/protein formula Vital 1.5, with the addition of protein supplement Pro-Stat. -Vital 1.5 at goal rate of 50 ml/hr with Pro-stat QID will provide 85% est kcal needs, and 100% est protein needs.  -Will monitor TF  tolerance and modify protein supplements as needed  6/04: -Vital 1.5 currently running at 30 ml/hr. This provides 1080 kcal (42% est kcal needs), and 49 gram protein (35% est protein needs). Received one Pro-Stat yesterday and tolerated w/out nausea -Pt reported much improvement in nausea with TF adjustments. Was willing to continue with increase to goal rate of 50 ml/hr after radiation treatment today at 3pm -Discussed TF with RN. Encouraged to administer Pro-Stat via PEG to minimize nausea -0% PO intake -MD  noted pt's elevated BUN/Crt may be related to steroids. Continuing with IVF at 150 ml/hr. -Will monitor TF advancement and increase Pro-Stat as warranted  6/05: -Vital 1.5 currently running at 40 ml/hr. This provides 1440 kcal (55% est kcal needs), 65 gram protein (46% est protein needs). Pt tolerating two Pro-Stat daily -Refused TF advancement on 6/05 -Pt noted he was going to trial advancement later today. Verbalized understanding in importance of receiving Pro-Stat QID to assist in meeting protein/kcal needs.  -Discussed w/RN. Was in agreement to encourage Pro-Stat and TF advancement as tolerated  Height: Ht Readings from Last 1 Encounters:  09/12/13 6\' 7"  (2.007 m)    Weight Status:   Wt Readings from Last 1 Encounters:  09/20/13 317 lb 8 oz (144.017 kg)  09/12/13 298 lbs  Re-estimated needs:  Kcal: 2600 - 2800  Protein: 140 - 160 grams  Fluid: 2.8 L fluid    Skin: chest incision, abdomen incision    Diet Order: Full Liquid   Intake/Output Summary (Last 24 hours) at 09/20/13 1046 Last data filed at 09/20/13 0530  Gross per 24 hour  Intake 10201.33 ml  Output   2400 ml  Net 7801.33 ml    Last BM: 6/03   Labs:   Recent Labs Lab 09/18/13 0528 09/19/13 0510 09/20/13 0500  NA 134* 138 139  K 3.7 4.0 3.8  CL 96 100 99  CO2 25 25 25   BUN 30* 34* 32*  CREATININE 1.94* 1.89* 1.71*  CALCIUM 7.3* 7.7* 8.2*  GLUCOSE 120* 104* 114*    CBG (last 3)   Recent Labs  09/20/13 0114 09/20/13 0456 09/20/13 0738  GLUCAP 109* 94 103*    Scheduled Meds: . amLODipine  10 mg Oral Daily  . chlorhexidine  15 mL Mouth/Throat QID  . dexamethasone  8 mg Intravenous Daily  . enoxaparin (LOVENOX) injection  40 mg Subcutaneous Q24H  . feeding supplement (PRO-STAT SUGAR FREE 64)  30 mL Per Tube QID  . guaiFENesin  1,200 mg Oral BID  . hydrALAZINE  25 mg Oral 3 times per day  . magic mouthwash w/lidocaine  10 mL Oral QID  . promethazine  25 mg Intravenous Q6H     Continuous Infusions: . sodium chloride 150 mL/hr at 09/20/13 0834  . feeding supplement (VITAL 1.5 CAL) 1,000 mL (09/20/13 0019)    Atlee Abide MS RD LDN Clinical Dietitian [phone]

## 2013-09-20 NOTE — Progress Notes (Signed)
Mason [name]   DOB:[birth year]   SA#:[id number]   NAT#:[id number]  I have seen the patient, examined him and edited the notes as follows  Subjective:  Afebrile this morning. His nausea improved significantly upon tube feeding adjustments, now at 40 ml . Occasional dry heaves when secretions build up, resolved when spitting. No further vomiting. He admits to have strong gag reflex prior to hospitalization. He is trying to increase his oral intake, with small bites at a time. Bilateral lower extremity swelling is improved.  Denies any respiratory or cardiac complaints.  Last bowel movement on 6/2 without blood in the stools. He is requesting stool softeners this morning. No confusion. He received radiation on 6/4, to continue as planned.  CT scan of the head on 6/4 was negative for metastases, but positive for scattered sinus disease and mucosal thickening versus nasal polyps.  Scheduled Meds: . amLODipine  10 mg Oral Daily  . chlorhexidine  15 mL Mouth/Throat QID  . dexamethasone  8 mg Intravenous Daily  . enoxaparin (LOVENOX) injection  40 mg Subcutaneous Q24H  . feeding supplement (PRO-STAT SUGAR FREE 64)  30 mL Per Tube QID  . guaiFENesin  1,200 mg Oral BID  . magic mouthwash w/lidocaine  10 mL Oral QID  . promethazine  25 mg Intravenous Q6H   Continuous Infusions: . sodium chloride 1,000 mL (09/20/13 0238)  . feeding supplement (VITAL 1.5 CAL) 1,000 mL (09/20/13 0019)   PRN Meds:.acetaminophen, docusate sodium, HYDROmorphone (DILAUDID) injection, lidocaine-prilocaine, LORazepam, ondansetron (ZOFRAN) IV, ondansetron (ZOFRAN) IV, ondansetron, ondansetron, oxyCODONE, senna-docusate, sucralfate  Objective:  Filed Vitals:   09/20/13 0535  BP: 142/111  Pulse: 115  Temp: 98.9 F (37.2 C)  Resp: 20    Body mass index is 35.75 kg/(m^2).  Intake/Output Summary (Last 24 hours) at 09/20/13 0726 Last data filed at 09/20/13 0235  Gross per 24 hour  Intake 8659.5 ml  Output   2600 ml  Net  6059.5 ml    GENERAL: alert, no distress and more comfortable this morning due to less nausea. SKIN: skin color, texture, turgor are normal, no rashes or significant lesions EYES: normal, conjunctiva are pink and non-injected, sclera clear OROPHARYNX: no exudate, no erythema and lips, buccal mucosa, and tongue normal. No mucositis NECK: supple, thyroid normal size, non-tender, without nodularity LYMPH:  Persistent palpable lymphadenopathy in the neck, improved compared to baseline. LUNGS: clear to auscultation and percussion with normal breathing effort. Left port normal HEART: regular rate & rhythm and no murmurs with 1 + bilateral lower extremity edema, right worse than left, no changes from prior day. ABDOMEN: abdomen soft, non-tender and normal bowel sounds. Feeding tube normal.  Musculoskeletal: no cyanosis of digits and no clubbing  PSYCH: alert & oriented x 3 with fluent speech NEURO: no focal motor/sensory deficits    CBG (last 3)   Recent Labs  09/19/13 2137 09/20/13 0114 09/20/13 0456  GLUCAP 107* 109* 94     Labs:   Recent Labs Lab 09/15/13 0348 09/19/13 0510  WBC 3.2* 4.5  HGB 11.7* 10.9*  HCT 35.6* 33.3*  PLT 140* 91*  MCV 87.0 86.9  MCH 28.6 28.5  MCHC 32.9 32.7  RDW 12.5 12.8  LYMPHSABS 0.4* 0.5*  MONOABS 0.3 0.5  EOSABS 0.1 0.0  BASOSABS 0.0 0.0     Chemistries:    Recent Labs Lab 09/16/13 0530 09/17/13 0540 09/18/13 0528 09/19/13 0510 09/20/13 0500  NA 134* 134* 134* 138 139  K 3.5* 3.2* 3.7 4.0 3.8  CL  94* 91* 96 100 99  CO2 28 24 25 25 25   GLUCOSE 105* 152* 120* 104* 114*  BUN 26* 23 30* 34* 32*  CREATININE 1.72* 1.67* 1.94* 1.89* 1.71*  CALCIUM 7.2* 7.1* 7.3* 7.7* 8.2*  AST  --   --   --  42* 38*  ALT  --   --   --  100* 96*  ALKPHOS  --   --   --  79 84  BILITOT  --   --   --  0.3 0.3     Liver Function Tests:  Recent Labs Lab 09/19/13 0510 09/20/13 0500  AST 42* 38*  ALT 100* 96*  ALKPHOS 79 84  BILITOT 0.3 0.3   PROT 6.2 6.4  ALBUMIN 3.0* 3.1*   CBG:  Recent Labs Lab 09/19/13 1155 09/19/13 1546 09/19/13 2137 09/20/13 0114 09/20/13 0456  GLUCAP 106* 106* 107* 109* 94   Imaging Studies:    CT HEAD WITHOUT CONTRAST: I have reviewed the images myself  COMPARISON: None available.  FINDINGS:  No acute infarct, hemorrhage, or mass lesion is present. There is no significant edema to suggest metastases. The ventricles are of normal size. No significant extra-axial fluid collection is present. Circumferential mucosal thickening is present in the right maxillary sinus and scattered ethmoid air cells. Mucosal thickening are possibly a polyps are present within the nasal cavity. The remaining paranasal sinuses are clear. The mastoid air cells are clear  IMPRESSION:  1. Normal CT appearance of the brain.  2. Scattered sinus disease.  3. Mucosal thickening versus nasal polyps.    Assessment/Plan:  #1 locally advanced tonsil cancer  The patient is not able to tolerate outpatient management. He can continue radiation therapy while hospitalized. On 6/1 had to be placed on hold due to vomiting. Reinforced the importance of radiation to the patient.  As symptoms began to improve, radiation was reinitiated on 6/4, to continue today, with lorazepam half an hour before his planned radiation for nausea and anxiety control. Appreciate Dr. Pearlie Oyster (Radiation Oncology)  follow up.   #2 uncontrolled nausea and vomiting with gagging  Patient reported that his mucus buildup caused these symptoms He was placed on  IV fluids and IV anti-emetics around the lock with promethazine IV Q6, zofran IV Q8 prn as this has worked well in prior admission.  Mucinex was increased on 6/1 to decrease gagging.  Scopolamine was tried on 6/2, but discontinued due to dizziness and dry mouth as side effects. Scheduled promethazine restarted  Daily dexamethasone was added for 2 days to see if this can help with his nausea. This is  helpful, thus, it is to be extended a few more days. Patient complained that feeding tube is contributing to nausea. His tube feeding was placed on hold on 6/3. On 6/4 it continued at a lower rate, initially at 30 ml, now at 40 ml per hour. Plan to cap the maximum rate to 50 ml per hour. CT scan of the head without contrast was negative for brain metastasis.However, it was positive for scattered sinus disease and mucosal thickening versus nasal polyps.  #3 severe dehydration with recent renal failure  Secondary to severe vomiting and dehydration Placed on IV fluid resuscitation, at 150 ml/hour. Renal failure worsened as of 6/3 with BUN and Creatinine increasing. May be related to steroid initiation.  As of 6/5, there is a slight improvement of his BUN and Creatinine. If this continues, as patient is able to slowly tolerate his oral intake  better, will begin to reduce the IV fluid administration.  #4 weight loss with malnutrition  Nutrition service consultation appreciated. PEG tube feeding had been increased to goal 90 ml/hr, then placed on hold. Patient complained that feeding tube was contributing to nausea.  His rate was decreased to 30 ml/hr with better tolerance.Plan to cap it at maximum 50 ml per hour. Currently, his rate is at 40 ml per hour Will continue to monitor. Total parenteral nutrition to be considered if he continues to have nausea and vomiting and patient may not be able to tolerate tube feeding.  #5 DVT prophylaxis  On Lovenox. Of note, patient refused 5/30 evening dose. Importance of Lovenox injection to reduce risk of thrombosis was discussed. He agreed to continue Lovenox injections daily.  #6 Hypoglycemia, resolved Due to decreased oral intake. Stable today Monitor closely  #7 Fever, resolved Likely reactive to inflammation and pain.  Resolved with antipyretics.  #8 Bilateral lower extremity swelling This is acute. May have been  exacerbated by IV fluids,  malnutrition and decreased ambulation. Low suspicion for clot as patient is on Lovenox anticoagulation.  Improving. Continue to elevate legs.   IV fluids to be decreased once renal function improves. serum albumin unchanged since admission at 3.0.  #9 CODE STATUS  Full code   #10 elevated liver function test Monitor closely. Likely related to recent treatment.  #11 anemia This is likely anemia of chronic disease. The patient denies recent history of bleeding such as epistaxis, hematuria or hematochezia. He is asymptomatic from the anemia. We will observe for now.  He does not require transfusion now.   #12 Constipation Due to decreased mobility, dehydration, pain meds and poor oral intake. Last bowel movement  on 6/2 Will provide colace stool softener, per patient's request.   #13 discharge planning Patient is unlikely able to go home within the next 3-4 days due to ongoing uncontrolled nausea, vomiting and malnutrition.  **Disclaimer: This note was dictated with voice recognition software. Similar sounding words can inadvertently be transcribed and this note may contain transcription errors which may not have been corrected upon publication of note.Rondel Jumbo, PA-C 09/20/2013  7:26 AM  [name], MD 09/20/2013

## 2013-09-20 NOTE — Plan of Care (Signed)
Problem: Phase I Progression Outcomes Goal: Hemodynamically stable Outcome: Progressing norvasc added 6/4

## 2013-09-20 NOTE — Progress Notes (Signed)
UR COMPLETED  

## 2013-09-20 NOTE — Progress Notes (Signed)
Addendum to my progress note today   #14 hypertension I have placed him on amlodipine and he still has significant hypertension. I will add hydralazine to control his high blood pressure.

## 2013-09-21 DIAGNOSIS — N179 Acute kidney failure, unspecified: Secondary | ICD-10-CM

## 2013-09-21 NOTE — Progress Notes (Signed)
Mason [name]   DOB:01-23-1966   DP#:[id number]   WER#:[id number]  Subjective: Patient denies any vomiting over the night.  He reports soreness to his neck and chest muscles related to vigorous coughing.  He reports trying to be mobile as much as possible.  Overall, he notes improvement.  [name], his nurse, reports no overnight events.  His TF at 40 cc/hour (Goal of 50 cc/hr). Last bowel movement two days ago reported as normal without blood.    Objective:  Filed Vitals:   09/21/13 0552  BP: 148/111  Pulse: 114  Temp: 98.1 F (36.7 C)  Resp: 19    Body mass index is 35.87 kg/(m^2).  Intake/Output Summary (Last 24 hours) at 09/21/13 0858 Last data filed at 09/20/13 2050  Gross per 24 hour  Intake     10 ml  Output    700 ml  Net   -690 ml    Laying in bed in no acute distress; L port-a cath   Sclerae unicteric  Oropharynx clear  Lungs clear -- no rales or rhonchi  Heart regular rate and rhythm  Abdomen benign, +PEG  MSK 1+ peripheral edema  Neuro nonfocal   CBG (last 3)   Recent Labs  09/20/13 1251 09/20/13 1739 09/20/13 2007  GLUCAP 109* 143* 104*     Labs:  Lab Results  Component Value Date   WBC 4.5 09/19/2013   HGB 10.9* 09/19/2013   HCT 33.3* 09/19/2013   MCV 86.9 09/19/2013   PLT 91* 09/19/2013   NEUTROABS 3.5 04/27/5091   Basic Metabolic Panel:  Recent Labs Lab 09/16/13 0530 09/17/13 0540 09/18/13 0528 09/19/13 0510 09/20/13 0500  NA 134* 134* 134* 138 139  K 3.5* 3.2* 3.7 4.0 3.8  CL 94* 91* 96 100 99  CO2 28 24 25 25 25   GLUCOSE 105* 152* 120* 104* 114*  BUN 26* 23 30* 34* 32*  CREATININE 1.72* 1.67* 1.94* 1.89* 1.71*  CALCIUM 7.2* 7.1* 7.3* 7.7* 8.2*   GFR Estimated Creatinine Clearance: 87 ml/min (by C-G formula based on Cr of 1.71). Liver Function Tests:  Recent Labs Lab 09/19/13 0510 09/20/13 0500  AST 42* 38*  ALT 100* 96*  ALKPHOS 79 84  BILITOT 0.3 0.3  PROT 6.2 6.4  ALBUMIN 3.0* 3.1*   CBC:  Recent Labs Lab 09/15/13 0348  09/19/13 0510  WBC 3.2* 4.5  NEUTROABS 2.4 3.5  HGB 11.7* 10.9*  HCT 35.6* 33.3*  MCV 87.0 86.9  PLT 140* 91*   Cardiac Enzymes: No results found for this basename: CKTOTAL, CKMB, CKMBINDEX, TROPONINI,  in the last 168 hours BNP: No components found with this basename: POCBNP,  CBG:  Recent Labs Lab 09/20/13 0456 09/20/13 0738 09/20/13 1251 09/20/13 1739 09/20/13 2007  GLUCAP 94 103* 109* 143* 104*    Studies:  Ct Head Wo Contrast  09/19/2013   CLINICAL DATA:  Severe nausea and hiccups. Tonsillar cancer. Question brain metastases.  EXAM: CT HEAD WITHOUT CONTRAST  TECHNIQUE: Contiguous axial images were obtained from the base of the skull through the vertex without intravenous contrast.  COMPARISON:  None available.  FINDINGS: No acute infarct, hemorrhage, or mass lesion is present. There is no significant edema to suggest metastases. The ventricles are of normal size. No significant extra-axial fluid collection is present.  Circumferential mucosal thickening is present in the right maxillary sinus and scattered ethmoid air cells. Mucosal thickening are possibly a polyps are present within the nasal cavity. The remaining paranasal sinuses are clear. The  mastoid air cells are clear  IMPRESSION: 1. Normal CT appearance of the brain. 2. Scattered sinus disease. 3. Mucosal thickening versus nasal polyps.   Electronically Signed   By: Lawrence Santiago M.D.   On: 09/19/2013 13:55    Assessment/Plan: 48 y.o.  Assessment/Plan:  #1 locally advanced tonsil cancer  -- Appreciate Dr. Pearlie Oyster (Radiation Oncology) follow up.  Patient refused XRT on 06/01 - 06/03 due #2. I recommend lorazepam half an hour before his planned radiation.   #2 Uncontrolled nausea and vomiting with gagging, improving  --He notes antiemetics including promethazine, zofran, mucinex, Dexamethasone has helped.  He denies emesis over the past 12 hours.  We will try to transition to PEG if possible over the next few days.     --TF at 40 mL per hour.  We will try to increase to goal rate of 50 mL per hour over the next few days.    -- CT scan of the head without contrast negative for brain metastasis.   #3 Severe dehydration with recent renal failure  --Secondary to severe vomiting and dehydration  Placed on IV fluid resuscitation, at 150 ml/hour. Peripheral edema is 1+ today.  Decrease rate to 75 cc/hour and check chemistries in am.  If creatinine improving and no vomiting, may taper off.   #4 weight loss with malnutrition  Nutrition service consultation appreciated. PEG tube feeding has been increased to goal 90 ml/hr, then placed on hold. Patient complained that feeding tube was contributing to nausea. His rate was decreased to 30 ml/hr with better tolerance. I plan to cap it at maximum 50 ml per hour.  Will continue to monitor. Total parenteral nutrition to be considered if he continues to have nausea and vomiting and patient may not be able to tolerate tube feeding.  #5 DVT prophylaxis  On Lovenox. Of note, patient refused 5/30 evening dose.  Importance of Lovenox injection to reduce risk of thrombosis was discussed. He agreed to continue Lovenox injections daily.   #6 Hypoglycemia  Due to decreased oral intake.  Stable today  Monitor closely   #7 Fever  Likely reactive to inflammation and pain.  Resolved with antipyretics.   #8 Bilateral lower extremity swelling  This is acute. May be exacerbated by IV fluids, malnutrition and decreased ambulation.  Low suspicion for clot as patient is on Lovenox anticoagulation.  Improving. Continue to elevate legs.  IV fluids to be decreased once renal function improves.  serum albumin unchanged since admission at 3.0.   #9 CODE STATUS  Full code   #10 elevated liver function test  Monitor closely. Likely related to recent treatment. Repeat chemistries in Am.   #11 anemia  This is likely anemia of chronic disease. The patient denies recent history of  bleeding such as epistaxis, hematuria or hematochezia. He is asymptomatic from the anemia. We will observe for now. He does not require transfusion now.   #12 discharge planning  Patient is unlikely able to go home within the next 2-3 days due to ongoing uncontrolled nausea, vomiting and malnutrition.    Concha Norway, MD 09/21/2013  8:58 AM

## 2013-09-21 NOTE — Progress Notes (Signed)
Patient with wound to the left of his PAC on left chest area scabbed area removed in cleaning skin for reaccessing PAC.  About 3 cm  x 1 cm and depth of 0.6-0.7 cm. With scant serous fluid noted. Applied dry gauze over area after reaccessed PAC.

## 2013-09-22 LAB — COMPREHENSIVE METABOLIC PANEL
ALT: 93 U/L — ABNORMAL HIGH (ref 0–53)
AST: 35 U/L (ref 0–37)
Albumin: 3.2 g/dL — ABNORMAL LOW (ref 3.5–5.2)
Alkaline Phosphatase: 86 U/L (ref 39–117)
BUN: 26 mg/dL — AB (ref 6–23)
CALCIUM: 8.8 mg/dL (ref 8.4–10.5)
CO2: 24 mEq/L (ref 19–32)
Chloride: 97 mEq/L (ref 96–112)
Creatinine, Ser: 1.43 mg/dL — ABNORMAL HIGH (ref 0.50–1.35)
GFR calc non Af Amer: 57 mL/min — ABNORMAL LOW (ref >90)
GFR, EST AFRICAN AMERICAN: 67 mL/min — AB (ref >90)
Glucose, Bld: 115 mg/dL — ABNORMAL HIGH (ref 70–99)
Potassium: 3.6 mEq/L — ABNORMAL LOW (ref 3.7–5.3)
Sodium: 136 mEq/L — ABNORMAL LOW (ref 137–147)
TOTAL PROTEIN: 6.7 g/dL (ref 6.0–8.3)
Total Bilirubin: 0.4 mg/dL (ref 0.3–1.2)

## 2013-09-22 MED ORDER — SODIUM CHLORIDE 0.9 % IV SOLN
INTRAVENOUS | Status: DC
  Administered 2013-09-22: 11:00:00 via INTRAVENOUS
  Administered 2013-09-23: 1000 mL via INTRAVENOUS
  Administered 2013-09-23 – 2013-09-30 (×9): via INTRAVENOUS
  Administered 2013-10-01 – 2013-10-02 (×2): 1000 mL via INTRAVENOUS
  Administered 2013-10-03 (×2): via INTRAVENOUS

## 2013-09-22 MED ORDER — POTASSIUM CHLORIDE CRYS ER 10 MEQ PO TBCR
20.0000 meq | EXTENDED_RELEASE_TABLET | Freq: Once | ORAL | Status: AC
  Administered 2013-09-22: 20 meq via ORAL
  Filled 2013-09-22: qty 2

## 2013-09-22 MED ORDER — POTASSIUM CHLORIDE CRYS ER 10 MEQ PO TBCR
10.0000 meq | EXTENDED_RELEASE_TABLET | Freq: Once | ORAL | Status: DC
  Filled 2013-09-22: qty 1

## 2013-09-22 MED ORDER — SODIUM CHLORIDE 0.9 % IV SOLN: INTRAVENOUS | Status: DC

## 2013-09-22 NOTE — Progress Notes (Signed)
Mason [name]   DOB:01/12/1966   QP#:[id number]   ZTI#:[id number]  Subjective: Patient had one episode of vomiting yesterday. He was given zofran with relief.   He still reports soreness to his neck and chest muscles related to vigorous coughing.  t.  Mason [name], his nurse, reports no overnight events.  His TF at 40 cc/hour (Goal of 50 cc/hr).    Objective:  Filed Vitals:   09/22/13 0700  BP: 149/107  Pulse: 86  Temp:   Resp:     Body mass index is 35.87 kg/(m^2).  Intake/Output Summary (Last 24 hours) at 09/22/13 0825 Last data filed at 09/22/13 0510  Gross per 24 hour  Intake 3107.25 ml  Output   3350 ml  Net -242.75 ml    Laying in bed in no acute distress; L port-a cath   Sclerae unicteric  Oropharynx clear  Lungs clear -- no rales or rhonchi  Heart regular rate and rhythm  Abdomen benign, +PEG  MSK 1+ peripheral edema  Neuro nonfocal   CBG (last 3)   Recent Labs  09/20/13 1251 09/20/13 1739 09/20/13 2007  GLUCAP 109* 143* 104*     Labs:  Lab Results  Component Value Date   WBC 4.5 09/19/2013   HGB 10.9* 09/19/2013   HCT 33.3* 09/19/2013   MCV 86.9 09/19/2013   PLT 91* 09/19/2013   NEUTROABS 3.5 11/18/5051   Basic Metabolic Panel:  Recent Labs Lab 09/17/13 0540 09/18/13 0528 09/19/13 0510 09/20/13 0500 09/22/13 0530  NA 134* 134* 138 139 136*  K 3.2* 3.7 4.0 3.8 3.6*  CL 91* 96 100 99 97  CO2 24 25 25 25 24   GLUCOSE 152* 120* 104* 114* 115*  BUN 23 30* 34* 32* 26*  CREATININE 1.67* 1.94* 1.89* 1.71* 1.43*  CALCIUM 7.1* 7.3* 7.7* 8.2* 8.8   GFR Estimated Creatinine Clearance: 104.1 ml/min (by C-G formula based on Cr of 1.43). Liver Function Tests:  Recent Labs Lab 09/19/13 0510 09/20/13 0500 09/22/13 0530  AST 42* 38* 35  ALT 100* 96* 93*  ALKPHOS 79 84 86  BILITOT 0.3 0.3 0.4  PROT 6.2 6.4 6.7  ALBUMIN 3.0* 3.1* 3.2*   CBC:  Recent Labs Lab 09/19/13 0510  WBC 4.5  NEUTROABS 3.5  HGB 10.9*  HCT 33.3*  MCV 86.9  PLT 91*   Cardiac  Enzymes: No results found for this basename: CKTOTAL, CKMB, CKMBINDEX, TROPONINI,  in the last 168 hours BNP: No components found with this basename: POCBNP,  CBG:  Recent Labs Lab 09/20/13 0456 09/20/13 0738 09/20/13 1251 09/20/13 1739 09/20/13 2007  GLUCAP 94 103* 109* 143* 104*    Studies:  No results found.  Assessment/Plan: 48 y.o.  Assessment/Plan:  #1 locally advanced tonsil cancer  -- Appreciate Dr. Pearlie Oyster (Radiation Oncology) follow up.  Patient refused XRT on 06/01 - 06/03 due #2. I recommend lorazepam half an hour before his planned radiation.   #2 Uncontrolled nausea and vomiting with gagging, improving  --He notes antiemetics including promethazine, zofran, mucinex, Dexamethasone has helped.  He had one small emesis yesterday.  We will try to transition to PEG if possible over the next few days.    --TF at 40 mL per hour.  We will try to increase to goal rate of 50 mL per hour over the next few days.    -- CT scan of the head without contrast negative for brain metastasis.   #3 Severe dehydration with recent renal failure  --Secondary to severe vomiting  and dehydration  Placed on IV fluid resuscitation, at 150 ml/hour. Peripheral edema is 1+ today.  Decrease rate to 50 cc/hour and check chemistries in am.  If creatinine stable and no vomiting, may taper off.  Creatinine 1.43.   #4 weight loss with malnutrition  Nutrition service consultation appreciated. PEG tube feeding has been increased to goal 90 ml/hr, then placed on hold. Patient complained that feeding tube was contributing to nausea. His rate was decreased to 30 ml/hr with better tolerance. I plan to cap it at maximum 50 ml per hour.  Will continue to monitor. Total parenteral nutrition to be considered if he continues to have nausea and vomiting and patient may not be able to tolerate tube feeding.  #5 DVT prophylaxis  On Lovenox. Of note, patient refused 5/30 evening dose.  Importance of Lovenox  injection to reduce risk of thrombosis was discussed. He agreed to continue Lovenox injections daily.   #6 Hypoglycemia  Due to decreased oral intake.  Stable today  Monitor closely   #7 Fever  Likely reactive to inflammation and pain.  Resolved with antipyretics.   #8 Bilateral lower extremity swelling  This is acute. May be exacerbated by IV fluids, malnutrition and decreased ambulation.  Low suspicion for clot as patient is on Lovenox anticoagulation.  Improving. Continue to elevate legs.  IV fluids to be decreased once renal function improves.  serum albumin improved to 3.2 (3.0 at admission)  #9 CODE STATUS  Full code   #10 elevated liver function test  Monitor closely. Likely related to recent treatment. Repeat chemistries in Am.   #11 anemia  This is likely anemia of chronic disease. The patient denies recent history of bleeding such as epistaxis, hematuria or hematochezia. He is asymptomatic from the anemia. We will observe for now. He does not require transfusion now.   #12 discharge planning  Patient is unlikely able to go home within the next day or so due to ongoing uncontrolled nausea, vomiting and malnutrition.   #13. Hypertension. --On amlodipine 10 mg daily, hydralazine 25 mg q 8 hours and metoprolol prn. Goal less than 140/90.   #14. Hypokalemia. --K-Dur 10 mEq x 2 now.  Likely secondary to #2.   Concha Norway, MD 09/22/2013  8:25 AM

## 2013-09-23 ENCOUNTER — Ambulatory Visit
Admission: RE | Admit: 2013-09-23 | Discharge: 2013-09-23 | Disposition: A | Discharge status: Home / Self Care | Payer: Medicaid Other | Source: Ambulatory Visit | Attending: Radiation Oncology | Admitting: Radiation Oncology

## 2013-09-23 ENCOUNTER — Encounter: Payer: Self-pay | Admitting: *Deleted

## 2013-09-23 VITALS — BP 153/100 | HR 120 | Temp 98.4°F | Resp 20

## 2013-09-23 DIAGNOSIS — Z510 Encounter for antineoplastic radiation therapy: Secondary | ICD-10-CM | POA: Diagnosis not present

## 2013-09-23 DIAGNOSIS — C099 Malignant neoplasm of tonsil, unspecified: Secondary | ICD-10-CM

## 2013-09-23 DIAGNOSIS — K5909 Other constipation: Secondary | ICD-10-CM

## 2013-09-23 LAB — COMPREHENSIVE METABOLIC PANEL
ALT: 106 U/L — AB (ref 0–53)
AST: 38 U/L — AB (ref 0–37)
Albumin: 3.2 g/dL — ABNORMAL LOW (ref 3.5–5.2)
Alkaline Phosphatase: 90 U/L (ref 39–117)
BUN: 25 mg/dL — ABNORMAL HIGH (ref 6–23)
CO2: 26 meq/L (ref 19–32)
Calcium: 8.9 mg/dL (ref 8.4–10.5)
Chloride: 96 mEq/L (ref 96–112)
Creatinine, Ser: 1.4 mg/dL — ABNORMAL HIGH (ref 0.50–1.35)
GFR calc Af Amer: 68 mL/min — ABNORMAL LOW (ref >90)
GFR calc non Af Amer: 59 mL/min — ABNORMAL LOW (ref >90)
Glucose, Bld: 97 mg/dL (ref 70–99)
Potassium: 3.8 mEq/L (ref 3.7–5.3)
SODIUM: 136 meq/L — AB (ref 137–147)
Total Bilirubin: 0.4 mg/dL (ref 0.3–1.2)
Total Protein: 6.6 g/dL (ref 6.0–8.3)

## 2013-09-23 LAB — GLUCOSE, CAPILLARY: Glucose-Capillary: 92 mg/dL (ref 70–99)

## 2013-09-23 MED ORDER — PROMETHAZINE HCL 25 MG/ML IJ SOLN
25.0000 mg | Freq: Two times a day (BID) | INTRAMUSCULAR | Status: DC
  Administered 2013-09-23 – 2013-09-24 (×3): 25 mg via INTRAVENOUS
  Filled 2013-09-23 (×6): qty 1

## 2013-09-23 MED ORDER — PRO-STAT SUGAR FREE PO LIQD
60.0000 mL | Freq: Two times a day (BID) | ORAL | Status: DC
  Administered 2013-09-23 – 2013-10-04 (×17): 60 mL
  Filled 2013-09-23 (×26): qty 60

## 2013-09-23 MED ORDER — BIAFINE EX EMUL
CUTANEOUS | Status: DC | PRN
  Administered 2013-09-23: 17:00:00 via TOPICAL

## 2013-09-23 NOTE — Progress Notes (Signed)
He rates his pain as a 8 on a scale of 0-10.  Pt complains of Fatigue, Generalized Weakness, Pain  Throbbing and Pain Occurs  Constantly, sore throat. Wheezing, Shortness of Breath  Walking and Coughing  Productive and Color of Phlegm  white and yellow. Pt is on room air.   Noted hyperpigmentation -  neck.  Pt denies dysphagia nothing has become stuck, but feels a lump sensation when swallowing. The patient does not eat regular meals due to feeling nauseated. Marland KitchenMarland Kitchen

## 2013-09-23 NOTE — Progress Notes (Signed)
Met with patient in [place] to provide support and encouragement.  Confirmed with Tomo that his morning tmt has been reschedule for 3:00 pm; patient aware.  I re-educated him on the importance of continuing with RT, he verbalized understanding.  Continuing navigation as L2 patient (treatments established).  Gayleen Orem, RN, BSN, [facility] Navigator [phone]

## 2013-09-23 NOTE — Progress Notes (Signed)
NUTRITION FOLLOW UP  Intervention: -Encourage Pro-Stat 60 ml BID to provide 400 kcal, 60 gram protein -Continue Vital 1.5 at goal rate of 50 ml/hr to provide 1800 kcal, 81 gram protein.  -TF regimen will provide 2200 kcal (85% est kcal needs), 141 gram protein (100% est protein needs), 920 ml free water. Will require 315 ml flush Q4H to meet total fluid needs once IVF d/c   Nutrition Dx:   Inadequate oral intake related to persistent nausea/vomiting as evidenced by pt report of frequent emesis after any intake-ongoing   Goal:   Pt to meet >/=90% of estimated nutrition needs -progressing with TF   Monitor:   TF tolerance, weight trend, labs, GI profile, total protein/energy intake   Assessment:   5/28: - Pt had PEG placed on 08/15/13  - Pt received cycle 2 chemotherapy on 09/10/2013  - Pt followed by [facility] has lost 30 lbs x 1 month (9.1%), which is severe for time frame  - Pt reports poor PO intake x 1 month; dietary intake includes things like pudding, grits, jello, Ensure  - Pt reports using his PEG tube for the first time last night and he put 1 Ensure Plus and water in the tube. Pt reports that he vomited after using the tube  - Pt still very nauseous at time of assessment  - Recommend using PEG for sole source of nutrition until pt able to meet >/=50% of minimum estimated needs by PO intake  No muscle or subcutaneous fat depletion noticed.  K, Mg WNL  5/29: -Pt has refused initiation of TF d/t ongoing nausea and vomiting episodes. RN reported pt refused last night and this morning as he had 3 episodes emesis this AM. -Per discussion with RN, pt has only tolerated sips of gingerale -MD noted pt with mucous and gagging that exacerbates GI symptoms. Receiving Mucinex and phenergan. Possible TPN candidate if continues with intractable n/v and intolerance of EN. -Modified TF to start at lower trickle feed of 10 ml/hr to possibly assist in improving  tolerance. Recommend Phos/Mg/K be monitored once pt able to tolerate initiation of TF  6/01: -Vital AF 1.2 currently running at 50 ml/hr, providing 1440 kcal (55% est kcal needs), and 90 gram protein (64% est protein needs). Pt tolerating w/out residuals. Pt refused further TF advancement on 5/31 -IVF running at 150 ml/hr -MD noted pt continues with nausea but no vomiting. Exhibits mucous/phlem/gagging that makes PO intake difficult/nonexistent.  PO intake 0% -Pt agreeable to trial advancement of TF to 60 ml/hr during time of RD follow. Declined any PO beverages. Informed RN to advance TF and assess tolerance.  -Recommend to continue to advance to Vital AF 1.2 to goal of 90 ml/hr as tolerated -K low -Renal function improving with hydration  6/03: -Pt vomited after Vital AF 1.2 was advanced to 70 ml/hr. TF was decreased to 50 ml/hr -Per RN, pt refused to restart TF this morning d/t nausea. MD also noted pt refusing radiation treatments since 6/01 -Discussed TF regimen with pt. He noted some improvement in nausea and is w/out vomiting. 0% PO intake d/t nausea/dry heaves -Pt may benefit from lower volume of more nutrient dense TF -Pt in agreement to modify to higher calorie/protein formula Vital 1.5, with the addition of protein supplement Pro-Stat. -Vital 1.5 at goal rate of 50 ml/hr with Pro-stat QID will provide 85% est kcal needs, and 100% est protein needs.  -Will monitor TF tolerance and modify  protein supplements as needed  6/04: -Vital 1.5 currently running at 30 ml/hr. This provides 1080 kcal (42% est kcal needs), and 49 gram protein (35% est protein needs). Received one Pro-Stat yesterday and tolerated w/out nausea -Pt reported much improvement in nausea with TF adjustments. Was willing to continue with increase to goal rate of 50 ml/hr after radiation treatment today at 3pm -Discussed TF with RN. Encouraged to administer Pro-Stat via PEG to minimize nausea -0% PO intake -MD noted  pt's elevated BUN/Crt may be related to steroids. Continuing with IVF at 150 ml/hr. -Will monitor TF advancement and increase Pro-Stat as warranted  6/05: -Vital 1.5 currently running at 40 ml/hr. This provides 1440 kcal (55% est kcal needs), 65 gram protein (46% est protein needs). Pt tolerating two Pro-Stat daily -Refused TF advancement on 6/05 -Pt noted he was going to trial advancement later today. Verbalized understanding in importance of receiving Pro-Stat QID to assist in meeting protein/kcal needs.  -Discussed w/RN. Was in agreement to encourage Pro-Stat and TF advancement as tolerated   6/08: -Pt tolerating Vital 1.5 at 50 ml/hr. Denied nausea/vomiting -Reports tolerance of Pro-Stat QID, however per discussion with RN, pt has been refusing Pro-Stat and likely consuming only 1-2 supplement daily.  -Vital 1.5 at 50 ml/hr with Pro-Stat once daily is providing 1900 kcal (73% est kcal needs), and 96 gram protein (69% est protein needs) -Discussed alternatives to increase pt's compliance of protein supplement with RN and Pharmacy.  -Will trial Pro-stat 60 ml BID and modify as warranted -One episode vomiting on 6/07-RN noted mostly salvia and is tolerating TF w/84ml residuals  Height: Ht Readings from Last 1 Encounters:  09/12/13 6\' 7"  (2.007 m)    Weight Status:   Wt Readings from Last 1 Encounters:  09/21/13 318 lb 8 oz (144.471 kg)  09/12/13 298 lbs  Re-estimated needs:  Kcal: 2600 - 2800  Protein: 140 - 160 grams  Fluid: 2.8 L fluid    Skin: chest incision, abdomen incision    Diet Order: Full Liquid   Intake/Output Summary (Last 24 hours) at 09/23/13 1025 Last data filed at 09/23/13 0600  Gross per 24 hour  Intake 2150.67 ml  Output   6275 ml  Net -4124.33 ml    Last BM: 6/07   Labs:   Recent Labs Lab 09/20/13 0500 09/22/13 0530 09/23/13 0530  NA 139 136* 136*  K 3.8 3.6* 3.8  CL 99 97 96  CO2 25 24 26   BUN 32* 26* 25*  CREATININE 1.71* 1.43*  1.40*  CALCIUM 8.2* 8.8 8.9  GLUCOSE 114* 115* 97    CBG (last 3)   Recent Labs  09/20/13 1251 09/20/13 1739 09/20/13 2007  GLUCAP 109* 143* 104*    Scheduled Meds: . amLODipine  10 mg Oral Daily  . chlorhexidine  15 mL Mouth/Throat QID  . enoxaparin (LOVENOX) injection  40 mg Subcutaneous Q24H  . feeding supplement (PRO-STAT SUGAR FREE 64)  60 mL Per Tube BID BM  . guaiFENesin  1,200 mg Oral BID  . hydrALAZINE  25 mg Oral 3 times per day  . magic mouthwash w/lidocaine  10 mL Oral QID  . promethazine  25 mg Intravenous Q6H    Continuous Infusions: . sodium chloride 50 mL/hr at 09/23/13 0005  . feeding supplement (VITAL 1.5 CAL) 1,000 mL (09/23/13 0005)    Atlee Abide MS RD LDN Clinical Dietitian [phone]

## 2013-09-23 NOTE — Progress Notes (Signed)
Mason [name]   DOB:1965/05/31   ZO#:[id number]   JWJ#:[id number]  I have seen the patient, examined him and edited the notes as follows  Subjective:  Afebrile this morning. His nausea improved significantly upon tube feeding adjustments, now at 50 ml since this morning . Occasional dry heaves when secretions build up, resolved when spitting. No further vomiting. He admits to have strong gag reflex prior to hospitalization. He is trying to increase his oral intake, with small bites at a time. Bilateral lower extremity swelling is improved.  Denies any respiratory or cardiac complaints.  Last bowel movement on 6/7 without blood in the stools. No confusion. He received radiation on 6/4, to continue as planned.   Scheduled Meds: . amLODipine  10 mg Oral Daily  . chlorhexidine  15 mL Mouth/Throat QID  . enoxaparin (LOVENOX) injection  40 mg Subcutaneous Q24H  . feeding supplement (PRO-STAT SUGAR FREE 64)  30 mL Per Tube QID  . guaiFENesin  1,200 mg Oral BID  . hydrALAZINE  25 mg Oral 3 times per day  . magic mouthwash w/lidocaine  10 mL Oral QID  . promethazine  25 mg Intravenous Q6H   Continuous Infusions: . sodium chloride 50 mL/hr at 09/23/13 0005  . feeding supplement (VITAL 1.5 CAL) 1,000 mL (09/23/13 0005)   PRN Meds:.acetaminophen, docusate sodium, HYDROmorphone (DILAUDID) injection, lidocaine-prilocaine, LORazepam, metoprolol, ondansetron (ZOFRAN) IV, ondansetron (ZOFRAN) IV, ondansetron, ondansetron, oxyCODONE, senna-docusate, sucralfate  Objective:  Filed Vitals:   09/23/13 0620  BP: 148/109  Pulse:   Temp:   Resp:     Body mass index is 35.87 kg/(m^2).  Intake/Output Summary (Last 24 hours) at 09/23/13 0800 Last data filed at 09/23/13 0600  Gross per 24 hour  Intake 2150.67 ml  Output   6625 ml  Net -4474.33 ml    GENERAL: alert, no distress and more comfortable this morning due to less nausea. SKIN: skin color, texture, turgor are normal, no rashes or significant  lesions EYES: normal, conjunctiva are pink and non-injected, sclera clear OROPHARYNX: no exudate, no erythema and lips, buccal mucosa, and tongue normal. No mucositis NECK: supple, thyroid normal size, non-tender, without nodularity LYMPH:  Persistent palpable lymphadenopathy in the neck, improved compared to baseline. LUNGS: clear to auscultation and percussion with normal breathing effort. Left port normal HEART: regular rate & rhythm and no murmurs with 1 + bilateral lower extremity edema, right worse than left, no changes from prior day. ABDOMEN: abdomen soft, non-tender and normal bowel sounds. Feeding tube normal.  Musculoskeletal: no cyanosis of digits and no clubbing  PSYCH: alert & oriented x 3 with fluent speech NEURO: no focal motor/sensory deficits    CBG (last 3)   Recent Labs  09/20/13 1251 09/20/13 1739 09/20/13 2007  GLUCAP 109* 143* 104*     Labs:   Recent Labs Lab 09/19/13 0510  WBC 4.5  HGB 10.9*  HCT 33.3*  PLT 91*  MCV 86.9  MCH 28.5  MCHC 32.7  RDW 12.8  LYMPHSABS 0.5*  MONOABS 0.5  EOSABS 0.0  BASOSABS 0.0     Chemistries:    Recent Labs Lab 09/18/13 0528 09/19/13 0510 09/20/13 0500 09/22/13 0530 09/23/13 0530  NA 134* 138 139 136* 136*  K 3.7 4.0 3.8 3.6* 3.8  CL 96 100 99 97 96  CO2 25 25 25 24 26   GLUCOSE 120* 104* 114* 115* 97  BUN 30* 34* 32* 26* 25*  CREATININE 1.94* 1.89* 1.71* 1.43* 1.40*  CALCIUM 7.3* 7.7* 8.2* 8.8  8.9  AST  --  42* 38* 35 38*  ALT  --  100* 96* 93* 106*  ALKPHOS  --  79 84 86 90  BILITOT  --  0.3 0.3 0.4 0.4     Liver Function Tests:  Recent Labs Lab 09/19/13 0510 09/20/13 0500 09/22/13 0530 09/23/13 0530  AST 42* 38* 35 38*  ALT 100* 96* 93* 106*  ALKPHOS 79 84 86 90  BILITOT 0.3 0.3 0.4 0.4  PROT 6.2 6.4 6.7 6.6  ALBUMIN 3.0* 3.1* 3.2* 3.2*   CBG:  Recent Labs Lab 09/20/13 0456 09/20/13 0738 09/20/13 1251 09/20/13 1739 09/20/13 2007  GLUCAP 94 103* 109* 143* 104*       Assessment/Plan:  #1 locally advanced tonsil cancer  The patient is not able to tolerate outpatient management. He can continue radiation therapy while hospitalized. On 6/1 had to be placed on hold due to vomiting. Reinforced the importance of radiation to the patient. As symptoms began to improve, radiation was reinitiated on 6/4. To continue today, with lorazepam half an hour before his planned radiation for nausea and anxiety control. Appreciate Dr. Pearlie Oyster (Radiation Oncology)  follow up.   #2 uncontrolled nausea and vomiting with gagging  Patient reported that his mucus buildup caused these symptoms He was placed on  IV fluids and IV anti-emetics around the lock with promethazine IV Q6, zofran IV Q8 prn as this has worked well in prior admission.  Mucinex was increased on 6/1 to decrease gagging.  Scopolamine was tried on 6/2, but discontinued due to dizziness and dry mouth as side effects. Scheduled promethazine restarted  Daily dexamethasone was added for to help with nausea with adequate results. Last dose on 6/6, plan to hold further doses Patient complained that feeding tube is contributing to nausea. His tube feeding was placed on hold on 6/3. On 6/4 it continued at a lower rate, initially at 30 ml, now at 50 ml per hour, initiated on 6/8. I plan to wean off scheduled IV antiemetics in anticipation of possible discharge end of the week. CT scan of the head without contrast was negative for brain metastasis. However, it was positive for scattered sinus disease and mucosal thickening versus nasal polyps. As of 6/8, his nausea is provoked by mucous buildup, resulting in expectoration of the "mucous plug" without blood visible, last event this morning. No vomiting. Continue with Mucinex as prescribed, along with antiemetics as scheduled.  #3 severe dehydration with recent renal failure  Secondary to severe vomiting and dehydration Placed on IV fluid resuscitation, at 150  ml/hour. Renal failure worsened as of 6/3 with BUN and Creatinine increasing. May be related to steroid initiation.  As of 6/5, there is a slight improvement of his BUN and Creatinine.  As of 6/7, his IV fluids were deduced to 50 cc/hour, as his renal functions continue to improve. Follow present therapy.  #4 weight loss with malnutrition  Nutrition service consultation appreciated. PEG tube feeding had been increased to goal 90 ml/hr, then placed on hold. Patient complained that feeding tube was contributing to nausea. Total parenteral nutrition was considered as he continued to have nausea and vomiting. His rate was decreased to 30 ml/hr with better tolerance, now at its goal rate of 50 ml/hr as of 6/8.  Will continue to monitor.  #5 DVT prophylaxis  On Lovenox. Of note, patient refused 5/30 evening dose. Importance of Lovenox injection to reduce risk of thrombosis was discussed. He agreed to continue Lovenox injections daily.  #  6 Hypoglycemia, resolved Due to decreased oral intake. Stable today Monitor closely  #7 Fever Likely reactive to inflammation and pain.  Resolved with antipyretics.  #8 Bilateral lower extremity swelling This is acute. May have been  exacerbated by IV fluids, malnutrition and decreased ambulation. Low suspicion for clot as patient is on Lovenox anticoagulation.  Improving. Continue to elevate legs and ambulate as tolerated.   IV fluids were decreased once renal function improved with improvement of this edema. serum albumin therapeutically increasing since admission at 3.2.  #9 CODE STATUS  Full code   #10 elevated liver function test Monitor closely. Likely related to recent treatment.  #11 anemia This is likely anemia of chronic disease. The patient denies recent history of bleeding such as epistaxis, hematuria or hematochezia. He is asymptomatic from the anemia. We will observe for now.  He does not require transfusion now.   #12 Constipation Due  to decreased mobility, dehydration, pain meds and poor oral intake. Last bowel movement  on 6/7  #13 Hypertension:  This was not well controlled with hydralazine and metroprolol for which amlodipine was added to the regimen on 6/5 at 10 mg daily, with blood pressure goal at 140/90. Blood pressure still elevated. Will monitor for now, may need increase of dose again. This is likely due to fluid retention. Hopefully with discontinuation of dexamethasone and reduction in IV fluids rate, this will continue to improve.  #14: Hypokalemia In the setting of vomiting and diuretics for edema and hypertension. He received Kdur 20 meq on 6/7 with resolution of electrolyte inbalance.  #15 discharge planning Will plan for discharge on the next 24-48 hrs if nausea, vomiting are better controlled and tube feeding is tolerated at a goal rate.  In addition, would be ideal to have patient discharged with blood pressure achieving its goal at 140/90 and the ability to tolerate oral antiemetics.   **Disclaimer: This note was dictated with voice recognition software. Similar sounding words can inadvertently be transcribed and this note may contain transcription errors which may not have been corrected upon publication of note.Rondel Jumbo, PA-C 09/23/2013  8:00 AM  [name], MD 09/23/2013

## 2013-09-23 NOTE — Care Management Note (Addendum)
    Page 1 of 1   09/24/2013     3:41:05 PM CARE MANAGEMENT NOTE 09/24/2013  Patient:  Mason [name], Mason [name]   Account Number:  [id number]  Date Initiated:  09/23/2013  Documentation initiated by:  Dessa Phi  Subjective/Objective Assessment:   48 Y/O M ADMITTED W/N/V.     Action/Plan:   FROM HOME.   Anticipated DC Date:  09/25/2013   Anticipated DC Plan:  [place]  In-house referral  Financial Counselor      DC Planning Services  CM consult      [facility] Choice  Resumption Of Svcs/PTA Provider   Choice offered to / List presented to:             Status of service:  In process, will continue to follow Medicare Important Message given?   (If response is "NO", the following Medicare IM given date fields will be blank) Date Medicare IM given:   Date Additional Medicare IM given:    Discharge Disposition:    Per UR Regulation:  Reviewed for med. necessity/level of care/duration of stay  If discussed at [place] of Stay Meetings, dates discussed:   09/24/2013    Comments:  09/24/13 Marshea Wisher RN,BSN NCM [phone] SPOKE TO PATIENT ABOUT D/C PLANS.ACTIVE [facility] HHRN/SW.POD#1 PEG-TF(VITAL-EXPENSIVE).PER [facility] DME REP-THEY CAN ASSIST WITH FORMULA ON CHARITY BASIS,BUT IF FORMULA CAN BE MORE COST EFFECTIVE HC:WCBJSEGB,TDVVOH-YWVP WOULD BE EASIER TO QUALIFY FOR CHARITY.LEFT MESSAGE W/ [facility] FINANCIAL COUNSELOR MGR-DARLENA [id number],& [facility] DIETICIAN BARBARA NEFF-ON VACATION [id number],BUT SOMEONE ANSWERING CALLS-TO ASSIST IF THEY CAN WITH COST OF FORMULA.  09/23/13 Beulah Capobianco RN,BSN NCM [phone] PEG @ GOAL.BP ISSUES TODAY.NO ANTICIPATED D/C NEEDS.

## 2013-09-23 NOTE — Progress Notes (Signed)
   Weekly Management Note:  inpatient Current Dose:  40 Gy  Projected Dose: 70 Gy   Narrative:  The patient presents for routine under treatment assessment.  CBCT/MVCT images/Port film x-rays were reviewed.  The chart was checked.Pt complains of Fatigue, Generalized Weakness, Pain Throbbing and Pain Occurs Constantly, sore throat. Wheezing, Shortness of Breath Walking and Coughing Productive and Color of Phlegm white and yellow. Pt is on room air.   Pt denies dysphagia nothing has become stuck, but feels a lump sensation when swallowing. Nausea is better, but still gagging due to secretions/sputum.   Physical Findings:  height is 6\' 7"  (2.007 m) and weight is 318 lb 8 oz (144.471 kg). His oral temperature is 98.3 F (36.8 C). His blood pressure is 148/109 and his pulse is 106. His respiration is 16 and oxygen saturation is 100%.  gagging, spitting thick sputum. Oropharynx - thick sputum, erythema, no thrush. Neck - hyperpigmented and dry.  CBC    Component Value Date/Time   WBC 4.5 09/19/2013 0510   WBC 4.2 09/06/2013 1438   RBC 3.83* 09/19/2013 0510   RBC 4.10* 09/06/2013 1438   HGB 10.9* 09/19/2013 0510   HGB 12.0* 09/06/2013 1438   HCT 33.3* 09/19/2013 0510   HCT 36.9* 09/06/2013 1438   PLT 91* 09/19/2013 0510   PLT 295 09/06/2013 1438   MCV 86.9 09/19/2013 0510   MCV 90.0 09/06/2013 1438   MCH 28.5 09/19/2013 0510   MCH 29.2 09/06/2013 1438   MCHC 32.7 09/19/2013 0510   MCHC 32.5 09/06/2013 1438   RDW 12.8 09/19/2013 0510   RDW 13.1 09/06/2013 1438   LYMPHSABS 0.5* 09/19/2013 0510   LYMPHSABS 0.6* 09/06/2013 1438   MONOABS 0.5 09/19/2013 0510   MONOABS 0.6 09/06/2013 1438   EOSABS 0.0 09/19/2013 0510   EOSABS 0.1 09/06/2013 1438   BASOSABS 0.0 09/19/2013 0510   BASOSABS 0.0 09/06/2013 1438     CMP     Component Value Date/Time   NA 136* 09/23/2013 0530   NA 137 09/06/2013 1438   K 3.8 09/23/2013 0530   K 4.7 09/06/2013 1438   CL 96 09/23/2013 0530   CO2 26 09/23/2013 0530   CO2 29 09/06/2013 1438   GLUCOSE  97 09/23/2013 0530   GLUCOSE 77 09/06/2013 1438   BUN 25* 09/23/2013 0530   BUN 10.6 09/06/2013 1438   CREATININE 1.40* 09/23/2013 0530   CREATININE 1.2 09/06/2013 1438   CALCIUM 8.9 09/23/2013 0530   CALCIUM 9.5 09/06/2013 1438   PROT 6.6 09/23/2013 0530   PROT 7.2 09/06/2013 1438   ALBUMIN 3.2* 09/23/2013 0530   ALBUMIN 3.4* 09/06/2013 1438   AST 38* 09/23/2013 0530   AST 31 09/06/2013 1438   ALT 106* 09/23/2013 0530   ALT 46 09/06/2013 1438   ALKPHOS 90 09/23/2013 0530   ALKPHOS 83 09/06/2013 1438   BILITOT 0.4 09/23/2013 0530   BILITOT 0.34 09/06/2013 1438   GFRNONAA 59* 09/23/2013 0530   GFRAA 68* 09/23/2013 0530     Impression:  The patient is tolerating radiotherapy with difficulty.   Plan:  Continue radiotherapy as planned.  Gave patient tube of biafine to apply to dry neck /face skin BID to TID. Appreciated med/onc's management of very difficult symptoms. -----------------------------------  Eppie Gibson, MD

## 2013-09-24 ENCOUNTER — Encounter: Payer: Self-pay | Admitting: *Deleted

## 2013-09-24 ENCOUNTER — Ambulatory Visit
Admission: RE | Admit: 2013-09-24 | Discharge: 2013-09-24 | Disposition: A | Discharge status: Home / Self Care | Payer: Medicaid Other | Source: Ambulatory Visit | Attending: Radiation Oncology | Admitting: Radiation Oncology

## 2013-09-24 LAB — GLUCOSE, CAPILLARY: GLUCOSE-CAPILLARY: 124 mg/dL — AB (ref 70–99)

## 2013-09-24 MED ORDER — POLYETHYLENE GLYCOL 3350 17 G PO PACK
17.0000 g | PACK | Freq: Every day | ORAL | Status: DC
  Administered 2013-09-27 – 2013-10-02 (×4): 17 g via ORAL
  Filled 2013-09-24 (×11): qty 1

## 2013-09-24 NOTE — Progress Notes (Signed)
NUTRITION FOLLOW UP  Intervention: -Continue to encourage Pro-Stat 60 ml BID to provide 400 kcal, 60 gram protein -Continue Vital 1.5 at goal rate of 50 ml/hr to provide 1800 kcal, 81 gram protein.  -TF regimen will provide 2200 kcal (85% est kcal needs), 141 gram protein (100% est protein needs), 920 ml free water. Will require 315 ml flush Q4H to meet total fluid needs once IVF d/c   Nutrition Dx:   Inadequate oral intake related to persistent nausea/vomiting as evidenced by pt report of frequent emesis after any intake-ongoing   Goal:   Pt to meet >/=90% of estimated nutrition needs -progressing with TF   Monitor:   TF tolerance, weight trend, labs, GI profile, total protein/energy intake   Assessment:   5/28: - Pt had PEG placed on 08/15/13  - Pt received cycle 2 chemotherapy on 09/10/2013  - Pt followed by [facility] has lost 30 lbs x 1 month (9.1%), which is severe for time frame  - Pt reports poor PO intake x 1 month; dietary intake includes things like pudding, grits, jello, Ensure  - Pt reports using his PEG tube for the first time last night and he put 1 Ensure Plus and water in the tube. Pt reports that he vomited after using the tube  - Pt still very nauseous at time of assessment  - Recommend using PEG for sole source of nutrition until pt able to meet >/=50% of minimum estimated needs by PO intake  No muscle or subcutaneous fat depletion noticed.  K, Mg WNL  5/29: -Pt has refused initiation of TF d/t ongoing nausea and vomiting episodes. RN reported pt refused last night and this morning as he had 3 episodes emesis this AM. -Per discussion with RN, pt has only tolerated sips of gingerale -MD noted pt with mucous and gagging that exacerbates GI symptoms. Receiving Mucinex and phenergan. Possible TPN candidate if continues with intractable n/v and intolerance of EN. -Modified TF to start at lower trickle feed of 10 ml/hr to possibly assist in  improving tolerance. Recommend Phos/Mg/K be monitored once pt able to tolerate initiation of TF  6/01: -Vital AF 1.2 currently running at 50 ml/hr, providing 1440 kcal (55% est kcal needs), and 90 gram protein (64% est protein needs). Pt tolerating w/out residuals. Pt refused further TF advancement on 5/31 -IVF running at 150 ml/hr -MD noted pt continues with nausea but no vomiting. Exhibits mucous/phlem/gagging that makes PO intake difficult/nonexistent.  PO intake 0% -Pt agreeable to trial advancement of TF to 60 ml/hr during time of RD follow. Declined any PO beverages. Informed RN to advance TF and assess tolerance.  -Recommend to continue to advance to Vital AF 1.2 to goal of 90 ml/hr as tolerated -K low -Renal function improving with hydration  6/03: -Pt vomited after Vital AF 1.2 was advanced to 70 ml/hr. TF was decreased to 50 ml/hr -Per RN, pt refused to restart TF this morning d/t nausea. MD also noted pt refusing radiation treatments since 6/01 -Discussed TF regimen with pt. He noted some improvement in nausea and is w/out vomiting. 0% PO intake d/t nausea/dry heaves -Pt may benefit from lower volume of more nutrient dense TF -Pt in agreement to modify to higher calorie/protein formula Vital 1.5, with the addition of protein supplement Pro-Stat. -Vital 1.5 at goal rate of 50 ml/hr with Pro-stat QID will provide 85% est kcal needs, and 100% est protein needs.  -Will monitor TF tolerance  and modify protein supplements as needed  6/04: -Vital 1.5 currently running at 30 ml/hr. This provides 1080 kcal (42% est kcal needs), and 49 gram protein (35% est protein needs). Received one Pro-Stat yesterday and tolerated w/out nausea -Pt reported much improvement in nausea with TF adjustments. Was willing to continue with increase to goal rate of 50 ml/hr after radiation treatment today at 3pm -Discussed TF with RN. Encouraged to administer Pro-Stat via PEG to minimize nausea -0% PO intake -MD  noted pt's elevated BUN/Crt may be related to steroids. Continuing with IVF at 150 ml/hr. -Will monitor TF advancement and increase Pro-Stat as warranted  6/05: -Vital 1.5 currently running at 40 ml/hr. This provides 1440 kcal (55% est kcal needs), 65 gram protein (46% est protein needs). Pt tolerating two Pro-Stat daily -Refused TF advancement on 6/05 -Pt noted he was going to trial advancement later today. Verbalized understanding in importance of receiving Pro-Stat QID to assist in meeting protein/kcal needs.  -Discussed w/RN. Was in agreement to encourage Pro-Stat and TF advancement as tolerated   6/08: -Pt tolerating Vital 1.5 at 50 ml/hr. Denied nausea/vomiting -Reports tolerance of Pro-Stat QID, however per discussion with RN, pt has been refusing Pro-Stat and likely consuming only 1-2 supplement daily.  -Vital 1.5 at 50 ml/hr with Pro-Stat once daily is providing 1900 kcal (73% est kcal needs), and 96 gram protein (69% est protein needs) -Discussed alternatives to increase pt's compliance of protein supplement with RN and Pharmacy.  -Will trial Pro-stat 60 ml BID and modify as warranted -One episode vomiting on 6/07-RN noted mostly salvia and is tolerating TF w/20ml residuals  6/09: -Vital 1.5 at 50 ml/hr, tolerating with 0 ml residuals. Having some vomiting of mucous. Receiving magic mouthwash -Received Pro-Stat 60 ml BID, will continue this regimen vs 30 ml QID to encourage supplement compliance -Pt to undergo radiation today -W/out BM for 3 days, daily miralax started -IVF at 50 ml/hr for renal failure, BUN/Crt elevated but improving  Height: Ht Readings from Last 1 Encounters:  09/12/13 6\' 7"  (2.007 m)    Weight Status:   Wt Readings from Last 1 Encounters:  09/21/13 318 lb 8 oz (144.471 kg)  09/12/13 298 lbs  Re-estimated needs:  Kcal: 2600 - 2800  Protein: 140 - 160 grams  Fluid: 2.8 L fluid    Skin: chest incision, abdomen incision    Diet Order: Full  Liquid   Intake/Output Summary (Last 24 hours) at 09/24/13 1216 Last data filed at 09/24/13 1208  Gross per 24 hour  Intake 2685.66 ml  Output   3175 ml  Net -489.34 ml    Last BM: 6/07   Labs:   Recent Labs Lab 09/20/13 0500 09/22/13 0530 09/23/13 0530  NA 139 136* 136*  K 3.8 3.6* 3.8  CL 99 97 96  CO2 25 24 26   BUN 32* 26* 25*  CREATININE 1.71* 1.43* 1.40*  CALCIUM 8.2* 8.8 8.9  GLUCOSE 114* 115* 97    CBG (last 3)   Recent Labs  09/23/13 2009 09/24/13 0747  GLUCAP 92 124*    Scheduled Meds: . amLODipine  10 mg Oral Daily  . chlorhexidine  15 mL Mouth/Throat QID  . enoxaparin (LOVENOX) injection  40 mg Subcutaneous Q24H  . feeding supplement (PRO-STAT SUGAR FREE 64)  60 mL Per Tube BID BM  . guaiFENesin  1,200 mg Oral BID  . hydrALAZINE  25 mg Oral 3 times per day  . magic mouthwash w/lidocaine  10 mL Oral  QID  . polyethylene glycol  17 g Oral Daily  . promethazine  25 mg Intravenous BID    Continuous Infusions: . sodium chloride 1,000 mL (09/23/13 1953)  . feeding supplement (VITAL 1.5 CAL) 1,000 mL (09/24/13 0030)    Atlee Abide MS RD LDN Clinical Dietitian [phone]

## 2013-09-24 NOTE — Progress Notes (Signed)
Mason [name]   DOB:04-07-1966   JE#:[id number]   OVZ#:[id number] I have seen the patient, examined him and edited the notes as follows  Subjective:  Afebrile this morning. His nausea improved significantly upon tube feeding adjustments, now at 50 ml since this morning .Intermittent dry heaves when secretions build up, resolved when spitting. Had one episode of  Vomiting mucous material during visit. He admits to have strong gag reflex prior to hospitalization. He is trying to increase his oral intake, with small bites at a time. Bilateral lower extremity swelling is improved.  Denies any respiratory or cardiac complaints.  Last bowel movement on 6/7 without blood in the stools. No confusion. He received radiation on 6/8, to continue as planned.  He did not get Ativan before radiation treatment and threw up within 5 minutes after his treatment. Scheduled Meds:  . amLODipine  10 mg Oral Daily  . chlorhexidine  15 mL Mouth/Throat QID  . enoxaparin (LOVENOX) injection  40 mg Subcutaneous Q24H  . feeding supplement (PRO-STAT SUGAR FREE 64)  60 mL Per Tube BID BM  . guaiFENesin  1,200 mg Oral BID  . hydrALAZINE  25 mg Oral 3 times per day  . magic mouthwash w/lidocaine  10 mL Oral QID  . promethazine  25 mg Intravenous BID   Continuous Infusions: . sodium chloride 1,000 mL (09/23/13 1953)  . feeding supplement (VITAL 1.5 CAL) 1,000 mL (09/24/13 0030)   PRN Meds:.acetaminophen, docusate sodium, HYDROmorphone (DILAUDID) injection, lidocaine-prilocaine, LORazepam, metoprolol, ondansetron (ZOFRAN) IV, ondansetron (ZOFRAN) IV, ondansetron, ondansetron, oxyCODONE, senna-docusate, sucralfate   Objective:  Filed Vitals:   09/24/13 0557  BP: 150/107  Pulse: 109  Temp: 98.4 F (36.9 C)  Resp: 16    Body mass index is 35.87 kg/(m^2).  Intake/Output Summary (Last 24 hours) at 09/24/13 0742 Last data filed at 09/24/13 0557  Gross per 24 hour  Intake   1674 ml  Output   1600 ml  Net     74 ml     GENERAL: alert, no distress and more comfortable this morning due to less nausea. SKIN: skin color, texture, turgor are normal, no rashes or significant lesions EYES: normal, conjunctiva are pink and non-injected, sclera clear OROPHARYNX: no exudate, no erythema and lips, buccal mucosa, and tongue normal. No mucositis NECK: supple, thyroid normal size, non-tender, without nodularity LYMPH:  Persistent palpable lymphadenopathy in the neck, improved compared to baseline. LUNGS: clear to auscultation and percussion with normal breathing effort. Left port normal HEART: regular rate & rhythm and no murmurs with 1 + bilateral lower extremity edema, right worse than left, no changes from prior day. ABDOMEN: abdomen soft, non-tender and normal bowel sounds. Feeding tube normal.  Musculoskeletal: no cyanosis of digits and no clubbing  PSYCH: alert & oriented x 3 with fluent speech NEURO: no focal motor/sensory deficits    CBG (last 3)   Recent Labs  09/23/13 2009  GLUCAP 92     Labs:   Recent Labs Lab 09/19/13 0510  WBC 4.5  HGB 10.9*  HCT 33.3*  PLT 91*  MCV 86.9  MCH 28.5  MCHC 32.7  RDW 12.8  LYMPHSABS 0.5*  MONOABS 0.5  EOSABS 0.0  BASOSABS 0.0     Chemistries:    Recent Labs Lab 09/18/13 0528 09/19/13 0510 09/20/13 0500 09/22/13 0530 09/23/13 0530  NA 134* 138 139 136* 136*  K 3.7 4.0 3.8 3.6* 3.8  CL 96 100 99 97 96  CO2 25 25 25 24  26  GLUCOSE 120* 104* 114* 115* 97  BUN 30* 34* 32* 26* 25*  CREATININE 1.94* 1.89* 1.71* 1.43* 1.40*  CALCIUM 7.3* 7.7* 8.2* 8.8 8.9  AST  --  42* 38* 35 38*  ALT  --  100* 96* 93* 106*  ALKPHOS  --  79 84 86 90  BILITOT  --  0.3 0.3 0.4 0.4     Liver Function Tests:  Recent Labs Lab 09/19/13 0510 09/20/13 0500 09/22/13 0530 09/23/13 0530  AST 42* 38* 35 38*  ALT 100* 96* 93* 106*  ALKPHOS 79 84 86 90  BILITOT 0.3 0.3 0.4 0.4  PROT 6.2 6.4 6.7 6.6  ALBUMIN 3.0* 3.1* 3.2* 3.2*   CBG:  Recent Labs Lab  09/20/13 0738 09/20/13 1251 09/20/13 1739 09/20/13 2007 09/23/13 2009  GLUCAP 103* 109* 143* 104* 92      Assessment/Plan:  #1 locally advanced tonsil cancer  The patient is not able to tolerate outpatient management. He can continue radiation therapy while hospitalized. On 6/1 had to be placed on hold due to vomiting. Reinforced the importance of radiation to the patient. As symptoms began to improve, radiation was reinitiated on 6/4. To continue today, with lorazepam half an hour before his planned radiation for anticipatory nausea and anxiety control. Appreciate Dr. Pearlie Oyster (Radiation Oncology)  follow up.   #2 uncontrolled nausea and vomiting with gagging  Patient reported that his mucus buildup caused these symptoms He was placed on  IV fluids and IV anti-emetics around the lock with promethazine IV Q6, zofran IV Q8 prn as this has worked well in prior admission.  Mucinex was increased on 6/1 to decrease gagging.  Scopolamine was tried on 6/2, but discontinued due to dizziness and dry mouth as side effects. Scheduled promethazine restarted  Daily dexamethasone was added for to help with nausea with adequate results. Last dose on 6/6, plan to hold further doses Patient complained that feeding tube is contributing to nausea. His tube feeding was placed on hold on 6/3. On 6/4 it continued at a lower rate, initially at 30 ml, now at 50 ml per hour, initiated on 6/8. Plan to wean off scheduled IV antiemetics in anticipation of possible discharge end of the week. As of 09/23/13, the scheduled promethazine is reduced to twice a day. Plan to continue slowly weaned off over the next few days.  CT scan of the head without contrast was negative for brain metastasis. However, it was positive for scattered sinus disease and mucosal thickening versus nasal polyps. As of 6/8, his nausea is provoked by mucous buildup, resulting in expectoration of the "mucous plug" without blood visible, last event this  morning, 6/9. Continue with Mucinex as prescribed, along with antiemetics as scheduled.  #3 severe dehydration with recent renal failure  Secondary to severe vomiting and dehydration Placed on IV fluid resuscitation, on admission at 150 ml/hour. Renal failure worsened as of 6/3 with BUN and Creatinine increasing. May be related to steroid initiation.  As of 6/5, there is a slight improvement of his BUN and Creatinine.  As of 6/7, his IV fluids were deduced to 50 cc/hour, as his renal functions continue to improve. Follow present therapy.  #4 weight loss with malnutrition  Nutrition service consultation appreciated. PEG tube feeding had been increased to goal 90 ml/hr, then placed on hold. Patient complained that feeding tube was contributing to nausea. Total parenteral nutrition was considered as he continued to have nausea and vomiting. His rate was decreased to 30 ml/hr with  better tolerance, now at its goal rate of 50 ml/hr as of 6/8.Tolerating tube feeding well.  Will continue to monitor.  #5 DVT prophylaxis  On Lovenox. Of note, patient refused 5/30 evening dose. Importance of Lovenox injection to reduce risk of thrombosis was discussed. He agreed to continue Lovenox injections daily.  #6 Hypoglycemia, resolved Due to decreased oral intake. Stable today Monitor closely  #7 Fever Likely reactive to inflammation and pain.  Resolved with antipyretics.  #8 Bilateral lower extremity swelling This is acute. May have been  exacerbated by IV fluids, malnutrition and decreased ambulation. Low suspicion for clot as patient is on Lovenox anticoagulation.  Improving. Continue to elevate legs and ambulate as tolerated.   IV fluids were decreased once renal function improved with improvement of this edema. serum albumin therapeutically increasing since admission at 3.2.  #9 CODE STATUS  Full code   #10 elevated liver function test Monitor closely. Likely related to recent  treatment.  #11 anemia This is likely anemia of chronic disease. The patient denies recent history of bleeding such as epistaxis, hematuria or hematochezia. He is asymptomatic from the anemia. We will observe for now.  He does not require transfusion now.   #12 Constipation Due to decreased mobility, dehydration, pain meds and poor oral intake. Last bowel movement  on 6/7 Will require additional laxatives, daily miralax is started on 09/24/13  #13 Hypertension:  This was not well controlled with hydralazine and metroprolol for which amlodipine was added to the regimen on 6/5 at 10 mg daily, with blood pressure goal at 140/90. Blood pressure still elevated. Will monitor for now, may need increase of dose again. This is likely due to fluid retention. Hopefully with discontinuation of dexamethasone and reduction in IV fluids rate, this will continue to improve.  #14: Hypokalemia In the setting of vomiting and diuretics for edema and hypertension. He received Kdur 20 meq on 6/7 with resolution of electrolyte inbalance.  #15 discharge planning Will plan for discharge on the next 48 hrs if nausea, vomiting are better controlled and tube feeding is tolerated at a goal rate.  In addition, would be ideal to have patient discharged with blood pressure achieving its goal at 140/90 and the ability to tolerate oral antiemetics.   **Disclaimer: This note was dictated with voice recognition software. Similar sounding words can inadvertently be transcribed and this note may contain transcription errors which may not have been corrected upon publication of note.Rondel Jumbo, PA-C 09/24/2013  7:42 AM  [name], MD 09/24/2013

## 2013-09-24 NOTE — Progress Notes (Signed)
Visited patient in [place].  His primary complaint is thickened saliva which causes constant expectoration, occasional gagging.  He finds some resolve with oral rinses.  He discussed his faith journey during his final year of imprisonment and his efforts since his release to "make right" relationships with his mother, daughter and grandson.  He expressed tearful frustration and sadness that he was "doing things right by the Lord" and then received his diagnosis.  I provided reflective listening and supported his belief that his present situation has a higher purpose.  We ended our visit with prayer.  Will continue to support him during this admission.  Gayleen Orem, RN, BSN, [facility] On[facility] Navigator [phone]

## 2013-09-25 ENCOUNTER — Ambulatory Visit
Admission: RE | Admit: 2013-09-25 | Discharge: 2013-09-25 | Disposition: A | Discharge status: Home / Self Care | Payer: Medicaid Other | Source: Ambulatory Visit | Attending: Radiation Oncology | Admitting: Radiation Oncology

## 2013-09-25 MED ORDER — OSMOLITE 1.5 CAL PO LIQD
1000.0000 mL | ORAL | Status: DC
  Administered 2013-09-25 – 2013-10-02 (×8): 1000 mL
  Filled 2013-09-25 (×11): qty 1000

## 2013-09-25 MED ORDER — PROMETHAZINE HCL 25 MG/ML IJ SOLN
25.0000 mg | Freq: Three times a day (TID) | INTRAMUSCULAR | Status: DC | PRN
  Administered 2013-09-25 – 2013-10-03 (×17): 25 mg via INTRAVENOUS
  Filled 2013-09-25 (×18): qty 1

## 2013-09-25 NOTE — Progress Notes (Signed)
NUTRITION FOLLOW UP  Intervention: -Trial Osmolite 1.5 at goal rate of 50 ml/hr to provide 1800 kcal, 75 gram protein for d/c needs -Continue to encourage Pro-Stat 60 ml BID to provide 400 kcal, 60 gram protein -TF regimen will provide 2200 kcal (85% est kcal needs), 135 gram protein (96% est protein needs), 915 ml free water. Will require 315 ml flush Q4H to meet total fluid needs once IVF d/c   Nutrition Dx:   Inadequate oral intake related to persistent nausea/vomiting as evidenced by pt report of frequent emesis after any intake-ongoing   Goal:   Pt to meet >/=90% of estimated nutrition needs -progressing with TF   Monitor:   TF tolerance, weight trend, labs, GI profile, total protein/energy intake   Assessment:   5/28: - Pt had PEG placed on 08/15/13  - Pt received cycle 2 chemotherapy on 09/10/2013  - Pt followed by [facility] has lost 30 lbs x 1 month (9.1%), which is severe for time frame  - Pt reports poor PO intake x 1 month; dietary intake includes things like pudding, grits, jello, Ensure  - Pt reports using his PEG tube for the first time last night and he put 1 Ensure Plus and water in the tube. Pt reports that he vomited after using the tube  - Pt still very nauseous at time of assessment  - Recommend using PEG for sole source of nutrition until pt able to meet >/=50% of minimum estimated needs by PO intake  No muscle or subcutaneous fat depletion noticed.  K, Mg WNL  5/29: -Pt has refused initiation of TF d/t ongoing nausea and vomiting episodes. RN reported pt refused last night and this morning as he had 3 episodes emesis this AM. -Per discussion with RN, pt has only tolerated sips of gingerale -MD noted pt with mucous and gagging that exacerbates GI symptoms. Receiving Mucinex and phenergan. Possible TPN candidate if continues with intractable n/v and intolerance of EN. -Modified TF to start at lower trickle feed of 10 ml/hr to possibly  assist in improving tolerance. Recommend Phos/Mg/K be monitored once pt able to tolerate initiation of TF  6/01: -Vital AF 1.2 currently running at 50 ml/hr, providing 1440 kcal (55% est kcal needs), and 90 gram protein (64% est protein needs). Pt tolerating w/out residuals. Pt refused further TF advancement on 5/31 -IVF running at 150 ml/hr -MD noted pt continues with nausea but no vomiting. Exhibits mucous/phlem/gagging that makes PO intake difficult/nonexistent.  PO intake 0% -Pt agreeable to trial advancement of TF to 60 ml/hr during time of RD follow. Declined any PO beverages. Informed RN to advance TF and assess tolerance.  -Recommend to continue to advance to Vital AF 1.2 to goal of 90 ml/hr as tolerated -K low -Renal function improving with hydration  6/03: -Pt vomited after Vital AF 1.2 was advanced to 70 ml/hr. TF was decreased to 50 ml/hr -Per RN, pt refused to restart TF this morning d/t nausea. MD also noted pt refusing radiation treatments since 6/01 -Discussed TF regimen with pt. He noted some improvement in nausea and is w/out vomiting. 0% PO intake d/t nausea/dry heaves -Pt may benefit from lower volume of more nutrient dense TF -Pt in agreement to modify to higher calorie/protein formula Vital 1.5, with the addition of protein supplement Pro-Stat. -Vital 1.5 at goal rate of 50 ml/hr with Pro-stat QID will provide 85% est kcal needs, and 100% est protein needs.  -Will monitor  TF tolerance and modify protein supplements as needed  6/04: -Vital 1.5 currently running at 30 ml/hr. This provides 1080 kcal (42% est kcal needs), and 49 gram protein (35% est protein needs). Received one Pro-Stat yesterday and tolerated w/out nausea -Pt reported much improvement in nausea with TF adjustments. Was willing to continue with increase to goal rate of 50 ml/hr after radiation treatment today at 3pm -Discussed TF with RN. Encouraged to administer Pro-Stat via PEG to minimize nausea -0% PO  intake -MD noted pt's elevated BUN/Crt may be related to steroids. Continuing with IVF at 150 ml/hr. -Will monitor TF advancement and increase Pro-Stat as warranted  6/05: -Vital 1.5 currently running at 40 ml/hr. This provides 1440 kcal (55% est kcal needs), 65 gram protein (46% est protein needs). Pt tolerating two Pro-Stat daily -Refused TF advancement on 6/05 -Pt noted he was going to trial advancement later today. Verbalized understanding in importance of receiving Pro-Stat QID to assist in meeting protein/kcal needs.  -Discussed w/RN. Was in agreement to encourage Pro-Stat and TF advancement as tolerated   6/08: -Pt tolerating Vital 1.5 at 50 ml/hr. Denied nausea/vomiting -Reports tolerance of Pro-Stat QID, however per discussion with RN, pt has been refusing Pro-Stat and likely consuming only 1-2 supplement daily.  -Vital 1.5 at 50 ml/hr with Pro-Stat once daily is providing 1900 kcal (73% est kcal needs), and 96 gram protein (69% est protein needs) -Discussed alternatives to increase pt's compliance of protein supplement with RN and Pharmacy.  -Will trial Pro-stat 60 ml BID and modify as warranted -One episode vomiting on 6/07-RN noted mostly salvia and is tolerating TF w/26ml residuals  6/09: -Vital 1.5 at 50 ml/hr, tolerating with 0 ml residuals. Having some vomiting of mucous. Receiving magic mouthwash -Received Pro-Stat 60 ml BID, will continue this regimen vs 30 ml QID to encourage supplement compliance -Pt to undergo radiation today -W/out BM for 3 days, daily miralax started -IVF at 50 ml/hr for renal failure, BUN/Crt elevated but improving  6/10: -Pt reports consuming and tolerating ProStat 60 ml BID; however per discussion with RN, pt refusing. -Tolerating TF, pt noted some mucous that is relieved by Mucinex.  -Discussed pt with outpatient RD concerning pt's d/c nutrition needs. Will trial Osmolite 1.5 as this would be more affordable/accessible than the specialty formula  Vital 1.5. If pt unable to tolerate, will need to be d/c on Vital 1.5. - Outpatient RD noted that [facility] will likely assist pt with providing TF -[facility] unable to provide pt with TF at this time.  Height: Ht Readings from Last 1 Encounters:  09/12/13 6\' 7"  (2.007 m)    Weight Status:   Wt Readings from Last 1 Encounters:  09/21/13 318 lb 8 oz (144.471 kg)  09/12/13 298 lbs  Re-estimated needs:  Kcal: 2600 - 2800  Protein: 140 - 160 grams  Fluid: 2.8 L fluid    Skin: chest incision, abdomen incision    Diet Order: Full Liquid   Intake/Output Summary (Last 24 hours) at 09/25/13 0947 Last data filed at 09/25/13 0531  Gross per 24 hour  Intake 2708.34 ml  Output   1850 ml  Net 858.34 ml    Last BM: 6/07   Labs:   Recent Labs Lab 09/20/13 0500 09/22/13 0530 09/23/13 0530  NA 139 136* 136*  K 3.8 3.6* 3.8  CL 99 97 96  CO2 25 24 26   BUN 32* 26* 25*  CREATININE 1.71* 1.43* 1.40*  CALCIUM 8.2* 8.8 8.9  GLUCOSE  114* 115* 97    CBG (last 3)   Recent Labs  09/23/13 2009 09/24/13 0747  GLUCAP 92 124*    Scheduled Meds: . amLODipine  10 mg Oral Daily  . chlorhexidine  15 mL Mouth/Throat QID  . enoxaparin (LOVENOX) injection  40 mg Subcutaneous Q24H  . feeding supplement (PRO-STAT SUGAR FREE 64)  60 mL Per Tube BID BM  . guaiFENesin  1,200 mg Oral BID  . hydrALAZINE  25 mg Oral 3 times per day  . magic mouthwash w/lidocaine  10 mL Oral QID  . polyethylene glycol  17 g Oral Daily    Continuous Infusions: . sodium chloride 50 mL/hr at 09/24/13 1955  . feeding supplement (VITAL 1.5 CAL) 1,000 mL (09/24/13 2314)    [place] LDN Clinical Dietitian [phone]

## 2013-09-25 NOTE — Progress Notes (Signed)
Mason [name]   DOB:[birth year]   KX#:[id number]   JIR#:[id number]  I have seen the patient, examined him and edited the notes as follows  Subjective:  Afebrile this morning. His nausea improved significantly upon tube feeding adjustments, now at 50 ml since this morning . Occasional dry heaves when secretions build up, resolved when spitting. No further vomiting. He admits to have strong gag reflex prior to hospitalization. He is trying to increase his oral intake, with small bites at a time. Bilateral lower extremity swelling is much improves.  Denies any respiratory or cardiac complaints.  Last bowel movement on 6/7 without blood in the stools. No confusion. He received radiation on 6/4, to continue as planned.   Scheduled Meds: . amLODipine  10 mg Oral Daily  . chlorhexidine  15 mL Mouth/Throat QID  . enoxaparin (LOVENOX) injection  40 mg Subcutaneous Q24H  . feeding supplement (PRO-STAT SUGAR FREE 64)  60 mL Per Tube BID BM  . guaiFENesin  1,200 mg Oral BID  . hydrALAZINE  25 mg Oral 3 times per day  . magic mouthwash w/lidocaine  10 mL Oral QID  . polyethylene glycol  17 g Oral Daily   Continuous Infusions: . sodium chloride 50 mL/hr at 09/24/13 1955  . feeding supplement (VITAL 1.5 CAL) 1,000 mL (09/24/13 2314)   PRN Meds:.acetaminophen, docusate sodium, HYDROmorphone (DILAUDID) injection, lidocaine-prilocaine, LORazepam, metoprolol, ondansetron (ZOFRAN) IV, ondansetron (ZOFRAN) IV, ondansetron, ondansetron, oxyCODONE, promethazine, senna-docusate, sucralfate  Objective:  Filed Vitals:   09/25/13 0539  BP: 145/98  Pulse: 95  Temp: 98.7 F (37.1 C)  Resp: 16    Body mass index is 35.87 kg/(m^2).  Intake/Output Summary (Last 24 hours) at 09/25/13 0820 Last data filed at 09/25/13 0531  Gross per 24 hour  Intake 2708.34 ml  Output   1850 ml  Net 858.34 ml    GENERAL: alert, no distress and more comfortable this morning due to less nausea. SKIN: skin color, texture, turgor  are normal, no rashes or significant lesions EYES: normal, conjunctiva are pink and non-injected, sclera clear OROPHARYNX: no exudate, no erythema and lips, buccal mucosa, and tongue normal. No mucositis NECK: supple, thyroid normal size, non-tender, without nodularity LYMPH:  Persistent palpable lymphadenopathy in the neck, improved compared to baseline. LUNGS: clear to auscultation and percussion with normal breathing effort. Left port normal HEART: regular rate & rhythm and no murmurs with trace bilateral lower extremity edema. ABDOMEN: abdomen soft, non-tender and normal bowel sounds. Feeding tube normal.  Musculoskeletal: no cyanosis of digits and no clubbing  PSYCH: alert & oriented x 3 with fluent speech NEURO: no focal motor/sensory deficits    CBG (last 3)   Recent Labs  09/23/13 2009 09/24/13 0747  GLUCAP 92 124*     Labs:   Recent Labs Lab 09/19/13 0510  WBC 4.5  HGB 10.9*  HCT 33.3*  PLT 91*  MCV 86.9  MCH 28.5  MCHC 32.7  RDW 12.8  LYMPHSABS 0.5*  MONOABS 0.5  EOSABS 0.0  BASOSABS 0.0     Chemistries:    Recent Labs Lab 09/19/13 0510 09/20/13 0500 09/22/13 0530 09/23/13 0530  NA 138 139 136* 136*  K 4.0 3.8 3.6* 3.8  CL 100 99 97 96  CO2 25 25 24 26   GLUCOSE 104* 114* 115* 97  BUN 34* 32* 26* 25*  CREATININE 1.89* 1.71* 1.43* 1.40*  CALCIUM 7.7* 8.2* 8.8 8.9  AST 42* 38* 35 38*  ALT 100* 96* 93* 106*  ALKPHOS 79  84 86 90  BILITOT 0.3 0.3 0.4 0.4     Liver Function Tests:  Recent Labs Lab 09/19/13 0510 09/20/13 0500 09/22/13 0530 09/23/13 0530  AST 42* 38* 35 38*  ALT 100* 96* 93* 106*  ALKPHOS 79 84 86 90  BILITOT 0.3 0.3 0.4 0.4  PROT 6.2 6.4 6.7 6.6  ALBUMIN 3.0* 3.1* 3.2* 3.2*   CBG:  Recent Labs Lab 09/20/13 1251 09/20/13 1739 09/20/13 2007 09/23/13 2009 09/24/13 0747  GLUCAP 109* 143* 104* 92 124*      Assessment/Plan:  #1 locally advanced tonsil cancer  The patient is not able to tolerate outpatient  management. He can continue radiation therapy while hospitalized. On 6/1 had to be placed on hold due to vomiting. Reinforced the importance of radiation to the patient. As symptoms began to improve, radiation was reinitiated on 6/4. To continue today, with lorazepam half an hour before his planned radiation for nausea and anxiety control. Appreciate Dr. Pearlie Oyster (Radiation Oncology) follow up.   #2 uncontrolled nausea and vomiting with gagging  Patient reported that his mucus buildup caused these symptoms He was placed on  IV fluids and IV anti-emetics around the lock with promethazine IV Q6, zofran IV Q8 prn as this has worked well in prior admission.  Mucinex was increased on 6/1 to decrease gagging.  Scopolamine was tried on 6/2, but discontinued due to dizziness and dry mouth as side effects. Scheduled promethazine restarted  Daily dexamethasone was added for to help with nausea with adequate results. Last dose on 6/6, plan to hold further doses Patient complained that feeding tube is contributing to nausea. His tube feeding was placed on hold on 6/3. On 6/4 it continued at a lower rate, initially at 30 ml, now at 50 ml per hour, initiated on 6/8.  I plan to wean off scheduled IV antiemetics in anticipation of possible discharge end of the week. Phenergan today has been changed to as needed instead of scheduled dose.  CT scan of the head without contrast was negative for brain metastasis. However, it was positive for scattered sinus disease and mucosal thickening versus nasal polyps. As of 6/8, his nausea is provoked by mucous buildup, resulting in expectoration of the "mucous plug" without blood visible, last event last evening. No vomiting. Continue with Mucinex as prescribed, along with antiemetics as scheduled.   #3 severe dehydration with recent renal failure  Secondary to severe vomiting and dehydration Placed on IV fluid resuscitation, at 150 ml/hour. Renal failure worsened as of 6/3  with BUN and Creatinine increasing. May be related to steroid initiation.  As of 6/5, there is a slight improvement of his BUN and Creatinine.  As of 6/7, his IV fluids were reduced to 50 cc/hour, as his renal functions continue to improve. Follow present therapy.  #4 weight loss with malnutrition  Nutrition service consultation appreciated. PEG tube feeding had been increased to goal 90 ml/hr, then placed on hold. Patient complained that feeding tube was contributing to nausea. Total parenteral nutrition was considered as he continued to have nausea and vomiting. His rate was decreased to 30 ml/hr with better tolerance, now at its goal rate of 50 ml/hr as of 6/8.  Will continue to monitor.  #5 DVT prophylaxis  On Lovenox. Of note, patient refused 5/30 evening dose. Importance of Lovenox injection to reduce risk of thrombosis was discussed. He agreed to continue Lovenox injections daily.  #6 Hypoglycemia, resolved Due to decreased oral intake. Stable today Monitor closely  #7  Fever Likely reactive to inflammation and pain.  Resolved with antipyretics.  #8 Bilateral lower extremity swelling This is acute. May have been  exacerbated by IV fluids, malnutrition and decreased ambulation. Low suspicion for clot as patient is on Lovenox anticoagulation.  Improving. Continue to elevate legs and ambulate as tolerated.   IV fluids were decreased once renal function improved with improvement of this edema. serum albumin therapeutically increasing since admission at 3.2 (on 6/8).  #9 CODE STATUS  Full code   #10 elevated liver function test Monitor closely. Likely related to recent treatment.  #11 anemia This is likely anemia of chronic disease. The patient denies recent history of bleeding such as epistaxis, hematuria or hematochezia. He is asymptomatic from the anemia. We will observe for now.  He does not require transfusion now.  Plan to recheck CBC tomorrow  #12 Constipation Due to  decreased mobility, dehydration, pain meds and poor oral intake. Last bowel movement  on 6/7 Miralax added on 6/9. Will monitor  #13 Hypertension:  This was not well controlled with hydralazine and metroprolol for which amlodipine was added to the regimen on 6/5 at 10 mg daily, with blood pressure goal at 140/90. Blood pressure still elevated. Will monitor for now, may need increase of dose again. This is likely due to fluid retention. Blood pressure to day is 145/98 Hopefully with discontinuation of dexamethasone and reduction in IV fluids rate, this will continue to improve.  #14: Hypokalemia In the setting of vomiting and diuretics for edema and hypertension. He received Kdur 20 meq on 6/7 with resolution of electrolyte inbalance.  #15 discharge planning Will plan for discharge on the next 24-48 hrs if nausea, vomiting are better controlled and tube feeding is tolerated at a goal rate.  In addition, would be ideal to have patient discharged with blood pressure achieving its goal at 140/90 and the ability to tolerate oral antiemetics.   **Disclaimer: This note was dictated with voice recognition software. Similar sounding words can inadvertently be transcribed and this note may contain transcription errors which may not have been corrected upon publication of note.Sharene Butters E, PA-C 09/25/2013  8:20 AM  Jamaul Heist, MD 09/25/2013

## 2013-09-26 ENCOUNTER — Ambulatory Visit
Admission: RE | Admit: 2013-09-26 | Discharge: 2013-09-26 | Disposition: A | Discharge status: Home / Self Care | Payer: Medicaid Other | Source: Ambulatory Visit | Attending: Radiation Oncology | Admitting: Radiation Oncology

## 2013-09-26 ENCOUNTER — Encounter (HOSPITAL_COMMUNITY): Payer: Self-pay | Admitting: Physician Assistant

## 2013-09-26 ENCOUNTER — Encounter: Payer: Self-pay | Admitting: *Deleted

## 2013-09-26 LAB — CBC WITH DIFFERENTIAL/PLATELET
Basophils Absolute: 0 10*3/uL (ref 0.0–0.1)
Basophils Relative: 0 % (ref 0–1)
Eosinophils Absolute: 0.1 10*3/uL (ref 0.0–0.7)
Eosinophils Relative: 2 % (ref 0–5)
HCT: 37.7 % — ABNORMAL LOW (ref 39.0–52.0)
Hemoglobin: 12.6 g/dL — ABNORMAL LOW (ref 13.0–17.0)
LYMPHS ABS: 0.5 10*3/uL — AB (ref 0.7–4.0)
LYMPHS PCT: 12 % (ref 12–46)
MCH: 29.3 pg (ref 26.0–34.0)
MCHC: 33.4 g/dL (ref 30.0–36.0)
MCV: 87.7 fL (ref 78.0–100.0)
Monocytes Absolute: 0.2 10*3/uL (ref 0.1–1.0)
Monocytes Relative: 6 % (ref 3–12)
NEUTROS ABS: 3.1 10*3/uL (ref 1.7–7.7)
NEUTROS PCT: 80 % — AB (ref 43–77)
PLATELETS: 103 10*3/uL — AB (ref 150–400)
RBC: 4.3 MIL/uL (ref 4.22–5.81)
RDW: 13.5 % (ref 11.5–15.5)
WBC: 3.9 10*3/uL — AB (ref 4.0–10.5)

## 2013-09-26 LAB — GLUCOSE, CAPILLARY: GLUCOSE-CAPILLARY: 94 mg/dL (ref 70–99)

## 2013-09-26 MED ORDER — METOPROLOL TARTRATE 25 MG/10 ML ORAL SUSPENSION
25.0000 mg | Freq: Two times a day (BID) | ORAL | Status: DC
  Administered 2013-09-26 – 2013-09-30 (×8): 25 mg via ORAL
  Filled 2013-09-26 (×10): qty 10

## 2013-09-26 NOTE — Plan of Care (Signed)
Problem: Phase II Progression Outcomes Goal: Vital signs remain stable Outcome: Progressing DBP remains elevated.

## 2013-09-26 NOTE — Progress Notes (Signed)
Pt was lying in bed and awake during visit. He was pleasant and only asked for prayer that he will get what he is supposed to get out of this (illness). He said he wants to be there for someone else. Pt talked about the spt he has from his family and church family. After prayer, pt was appreciative for visit. Ernest Haber Chaplain  09/26/13 0900  Clinical Encounter Type  Visited With Patient

## 2013-09-26 NOTE — Progress Notes (Signed)
Mason [name]   DOB:13-May-1965   XB#:[id number]   NUU#:[id number]  I have seen the patient, examined him and edited the notes as follows   Subjective:  Afebrile this morning. His nausea improved significantly upon tube feeding adjustments, now at 50 ml since this morning .Dry heaves present during this evaluation. These are worse when secretions build up, resolved when spitting. He states that he is due for his morning Mucinex, which helps. No further vomiting. He admits to have strong gag reflex prior to hospitalization. He is trying to increase his oral intake, with small bites at a time. Bilateral lower extremity swelling is resolved.  Denies any respiratory or cardiac complaints.  Last bowel movement on 6/7 without blood in the stools. He reports "no need to go to the bathroom".  No confusion. He received radiation on 6/4, to continue as planned. Increasing mobility with walker.   Scheduled Meds: . amLODipine  10 mg Oral Daily  . chlorhexidine  15 mL Mouth/Throat QID  . enoxaparin (LOVENOX) injection  40 mg Subcutaneous Q24H  . feeding supplement (PRO-STAT SUGAR FREE 64)  60 mL Per Tube BID BM  . guaiFENesin  1,200 mg Oral BID  . hydrALAZINE  25 mg Oral 3 times per day  . magic mouthwash w/lidocaine  10 mL Oral QID  . polyethylene glycol  17 g Oral Daily   Continuous Infusions: . sodium chloride 50 mL/hr at 09/26/13 0530  . feeding supplement (OSMOLITE 1.5 CAL) 1,000 mL (09/25/13 1921)   PRN Meds:.acetaminophen, docusate sodium, HYDROmorphone (DILAUDID) injection, lidocaine-prilocaine, LORazepam, metoprolol, ondansetron (ZOFRAN) IV, ondansetron (ZOFRAN) IV, ondansetron, ondansetron, oxyCODONE, promethazine, senna-docusate, sucralfate  Objective:  Filed Vitals:   09/26/13 0550  BP:   Pulse: 98  Temp:   Resp:     Body mass index is 33.59 kg/(m^2).  Intake/Output Summary (Last 24 hours) at 09/26/13 0736 Last data filed at 09/26/13 0520  Gross per 24 hour  Intake   1719 ml   Output   1425 ml  Net    294 ml    GENERAL: alert, no distress less comfortable this morning due to dry heaves SKIN: skin color, texture, turgor are normal, no rashes or significant lesions EYES: normal, conjunctiva are pink and non-injected, sclera clear OROPHARYNX: no exudate, no erythema and lips, buccal mucosa, and tongue normal. No mucositis NECK: supple, thyroid normal size, non-tender, without nodularity LYMPH:  Persistent palpable lymphadenopathy in the neck, improved compared to baseline. LUNGS: clear to auscultation and percussion with normal breathing effort. Left port normal HEART: regular rate & rhythm and no murmurs with trace bilateral lower extremity edema. ABDOMEN: abdomen soft, non-tender and normal bowel sounds. Feeding tube normal.  Musculoskeletal: no cyanosis of digits and no clubbing  PSYCH: alert & oriented x 3 with fluent speech NEURO: no focal motor/sensory deficits    CBG (last 3)   Recent Labs  09/23/13 2009 09/24/13 0747  GLUCAP 92 124*     Labs:   Recent Labs Lab 09/26/13 0510  WBC 3.9*  HGB 12.6*  HCT 37.7*  PLT 103*  MCV 87.7  MCH 29.3  MCHC 33.4  RDW 13.5  LYMPHSABS 0.5*  MONOABS 0.2  EOSABS 0.1  BASOSABS 0.0     Chemistries:    Recent Labs Lab 09/20/13 0500 09/22/13 0530 09/23/13 0530  NA 139 136* 136*  K 3.8 3.6* 3.8  CL 99 97 96  CO2 25 24 26   GLUCOSE 114* 115* 97  BUN 32* 26* 25*  CREATININE 1.71*  1.43* 1.40*  CALCIUM 8.2* 8.8 8.9  AST 38* 35 38*  ALT 96* 93* 106*  ALKPHOS 84 86 90  BILITOT 0.3 0.4 0.4     Liver Function Tests:  Recent Labs Lab 09/20/13 0500 09/22/13 0530 09/23/13 0530  AST 38* 35 38*  ALT 96* 93* 106*  ALKPHOS 84 86 90  BILITOT 0.3 0.4 0.4  PROT 6.4 6.7 6.6  ALBUMIN 3.1* 3.2* 3.2*   CBG:  Recent Labs Lab 09/20/13 1251 09/20/13 1739 09/20/13 2007 09/23/13 2009 09/24/13 0747  GLUCAP 109* 143* 104* 92 124*      Assessment/Plan:  #1 locally advanced tonsil cancer   The patient is not able to tolerate outpatient management. He can continue radiation therapy while hospitalized. On 6/1 had to be placed on hold due to vomiting. Reinforced the importance of radiation to the patient. As symptoms began to improve, radiation was reinitiated on 6/4. To continue today, with lorazepam half an hour before his planned radiation for nausea and anxiety control. Appreciate Dr. Pearlie Oyster (Radiation Oncology) follow up.   #2 uncontrolled nausea and vomiting with gagging  Patient reported that his mucus buildup caused these symptoms He was placed on  IV fluids and IV anti-emetics around the lock with promethazine IV Q6, zofran IV Q8 prn as this has worked well in prior admission.  Mucinex was increased on 6/1 to decrease gagging.  Scopolamine was tried on 6/2, but discontinued due to dizziness and dry mouth as side effects. Scheduled promethazine restarted  Daily dexamethasone was added for to help with nausea with adequate results. Last dose on 6/6, plan to hold further doses Patient complained that feeding tube is contributing to nausea. His tube feeding was placed on hold on 6/3. On 6/4 it continued at a lower rate, initially at 30 ml, now at 50 ml per hour, initiated on 6/8.  CT scan of the head without contrast was negative for brain metastasis. However, it was positive for scattered sinus disease and mucosal thickening versus nasal polyps. As of 6/8, his nausea is provoked by mucous buildup, resulting in expectoration of the "mucous plug" without blood visible, last event last evening. No vomiting. Continue with Mucinex as prescribed, along with antiemetics as scheduled.  On 6/10 Phenergan today has been changed to as needed instead of scheduled dose. Plan to wean off scheduled IV antiemetics in anticipation of possible in the near future.  #3 severe dehydration with recent renal failure  Secondary to severe vomiting and dehydration Placed on IV fluid resuscitation, at 150  ml/hour. Renal failure worsened as of 6/3 with BUN and Creatinine increasing. May be related to steroid initiation.  As of 6/5, there is a slight improvement of his BUN and Creatinine.  As of 6/7, his IV fluids were reduced to 50 cc/hour, as his renal functions continue to improve. Follow present therapy. Will check chemistries in the morning  #4 weight loss with malnutrition  Nutrition service consultation appreciated. PEG tube feeding had been increased to goal 90 ml/hr, then placed on hold. Patient complained that feeding tube was contributing to nausea. Total parenteral nutrition was considered as he continued to have nausea and vomiting. His rate was decreased to 30 ml/hr with better tolerance, now at its goal rate of 50 ml/hr as of 6/8. Patient is trying to increase oral intake as tolerated.   Will continue to monitor.  #5 DVT prophylaxis  On Lovenox. Of note, patient refused 5/30 evening dose. Importance of Lovenox injection to reduce risk of  thrombosis was discussed. He agreed to continue Lovenox injections daily.  #6 Hypoglycemia, resolved Due to decreased oral intake. Stable today Monitor closely  #7 Fever Likely reactive to inflammation and pain.  Resolved with antipyretics.  #8 Bilateral lower extremity swelling This is acute. May have been  exacerbated by IV fluids, malnutrition and decreased ambulation. Low suspicion for clot as patient is on Lovenox anticoagulation.  Improving. Continue to elevate legs and ambulate as tolerated.   IV fluids were decreased once renal function improved with improvement of this edema. serum albumin therapeutically increasing since admission at 3.2 (on 6/8). Will recheck in the morning.  #9 CODE STATUS  Full code   #10 elevated liver function test Monitor closely. Likely related to recent treatment. Will recheck in the morning  #11 anemia This is likely anemia of chronic disease. The patient denies recent history of bleeding such as  epistaxis, hematuria or hematochezia. He is asymptomatic from the anemia. We will observe for now.  He does not require transfusion now.    #12 Constipation Due to decreased mobility, dehydration, pain meds and poor oral intake. Last bowel movement  on 6/7 Miralax added on 6/9. Will provide laxative today.  #13 Hypertension:  This was not well controlled with hydralazine and metroprolol for which amlodipine was added to the regimen on 6/5 at 10 mg daily, with blood pressure goal at 140/90. Blood pressure still elevated. Will monitor for now, may need increase of dose again. This is likely due to fluid retention.  Hopefully with discontinuation of dexamethasone and reduction in IV fluids rate, this will continue to improve.  #14: Hypokalemia In the setting of vomiting and diuretics for edema and hypertension. He received Kdur 20 meq on 6/7 with resolution of electrolyte inbalance. Will check tomorrow  #15 discharge planning Will plan for discharge early next week if nausea, vomiting are better controlled and tube feeding is tolerated at a goal rate.  In addition, would be ideal to have patient discharged with blood pressure achieving its goal at 140/90 and the ability to tolerate oral antiemetics.   **Disclaimer: This note was dictated with voice recognition software. Similar sounding words can inadvertently be transcribed and this note may contain transcription errors which may not have been corrected upon publication of note.Sharene Butters E, PA-C 09/26/2013  7:36 AM Blaike Vickers, MD 09/26/2013

## 2013-09-26 NOTE — Progress Notes (Signed)
UR COMPLETED  

## 2013-09-26 NOTE — Progress Notes (Signed)
His blood pressure remained poorly controlled. I will add metoprolol twice a day scheduled.

## 2013-09-26 NOTE — Plan of Care (Signed)
Problem: Phase III Progression Outcomes Goal: Pain controlled on oral analgesia Outcome: Progressing Patient took oxyir with pain level at 8/10 currently with  pain score of 5/10 few hours later.

## 2013-09-26 NOTE — Plan of Care (Signed)
Problem: Phase II Progression Outcomes Goal: Other Phase II Outcomes/Goals Outcome: Progressing Patient with mucus secretions leading to nausea due to coughing and gagging.

## 2013-09-26 NOTE — Progress Notes (Signed)
[facility] Therapy Treatment Record Phone [phone]   Radiation Therapy was administered to Mason [name] on: 09/26/2013  3:45 PM and was treatment # 23 out of a planned [name] of 35 treatments.

## 2013-09-26 NOTE — Progress Notes (Signed)
NUTRITION FOLLOW UP  Intervention: -Continue Osmolite 1.5 at goal rate of 50 ml/hr to provide 1800 kcal, 75 gram protein for d/c needs -Continue to encourage Pro-Stat 60 ml BID to provide 400 kcal, 60 gram protein -TF regimen will provide 2200 kcal (85% est kcal needs), 135 gram protein (96% est protein needs), 915 ml free water. Will require 315 ml flush Q4H to meet total fluid needs once IVF d/c   Nutrition Dx:   Inadequate oral intake related to persistent nausea/vomiting as evidenced by pt report of frequent emesis after any intake-ongoing   Goal:   Pt to meet >/=90% of estimated nutrition needs -progressing with TF   Monitor:   TF tolerance, weight trend, labs, GI profile, total protein/energy intake   Assessment:   5/28: - Pt had PEG placed on 08/15/13  - Pt received cycle 2 chemotherapy on 09/10/2013  - Pt followed by [facility] has lost 30 lbs x 1 month (9.1%), which is severe for time frame  - Pt reports poor PO intake x 1 month; dietary intake includes things like pudding, grits, jello, Ensure  - Pt reports using his PEG tube for the first time last night and he put 1 Ensure Plus and water in the tube. Pt reports that he vomited after using the tube  - Pt still very nauseous at time of assessment  - Recommend using PEG for sole source of nutrition until pt able to meet >/=50% of minimum estimated needs by PO intake  No muscle or subcutaneous fat depletion noticed.  K, Mg WNL  5/29: -Pt has refused initiation of TF d/t ongoing nausea and vomiting episodes. RN reported pt refused last night and this morning as he had 3 episodes emesis this AM. -Per discussion with RN, pt has only tolerated sips of gingerale -MD noted pt with mucous and gagging that exacerbates GI symptoms. Receiving Mucinex and phenergan. Possible TPN candidate if continues with intractable n/v and intolerance of EN. -Modified TF to start at lower trickle feed of 10 ml/hr to possibly  assist in improving tolerance. Recommend Phos/Mg/K be monitored once pt able to tolerate initiation of TF  6/01: -Vital AF 1.2 currently running at 50 ml/hr, providing 1440 kcal (55% est kcal needs), and 90 gram protein (64% est protein needs). Pt tolerating w/out residuals. Pt refused further TF advancement on 5/31 -IVF running at 150 ml/hr -MD noted pt continues with nausea but no vomiting. Exhibits mucous/phlem/gagging that makes PO intake difficult/nonexistent.  PO intake 0% -Pt agreeable to trial advancement of TF to 60 ml/hr during time of RD follow. Declined any PO beverages. Informed RN to advance TF and assess tolerance.  -Recommend to continue to advance to Vital AF 1.2 to goal of 90 ml/hr as tolerated -K low -Renal function improving with hydration  6/03: -Pt vomited after Vital AF 1.2 was advanced to 70 ml/hr. TF was decreased to 50 ml/hr -Per RN, pt refused to restart TF this morning d/t nausea. MD also noted pt refusing radiation treatments since 6/01 -Discussed TF regimen with pt. He noted some improvement in nausea and is w/out vomiting. 0% PO intake d/t nausea/dry heaves -Pt may benefit from lower volume of more nutrient dense TF -Pt in agreement to modify to higher calorie/protein formula Vital 1.5, with the addition of protein supplement Pro-Stat. -Vital 1.5 at goal rate of 50 ml/hr with Pro-stat QID will provide 85% est kcal needs, and 100% est protein needs.  -Will monitor  TF tolerance and modify protein supplements as needed  6/04: -Vital 1.5 currently running at 30 ml/hr. This provides 1080 kcal (42% est kcal needs), and 49 gram protein (35% est protein needs). Received one Pro-Stat yesterday and tolerated w/out nausea -Pt reported much improvement in nausea with TF adjustments. Was willing to continue with increase to goal rate of 50 ml/hr after radiation treatment today at 3pm -Discussed TF with RN. Encouraged to administer Pro-Stat via PEG to minimize nausea -0% PO  intake -MD noted pt's elevated BUN/Crt may be related to steroids. Continuing with IVF at 150 ml/hr. -Will monitor TF advancement and increase Pro-Stat as warranted  6/05: -Vital 1.5 currently running at 40 ml/hr. This provides 1440 kcal (55% est kcal needs), 65 gram protein (46% est protein needs). Pt tolerating two Pro-Stat daily -Refused TF advancement on 6/05 -Pt noted he was going to trial advancement later today. Verbalized understanding in importance of receiving Pro-Stat QID to assist in meeting protein/kcal needs.  -Discussed w/RN. Was in agreement to encourage Pro-Stat and TF advancement as tolerated   6/08: -Pt tolerating Vital 1.5 at 50 ml/hr. Denied nausea/vomiting -Reports tolerance of Pro-Stat QID, however per discussion with RN, pt has been refusing Pro-Stat and likely consuming only 1-2 supplement daily.  -Vital 1.5 at 50 ml/hr with Pro-Stat once daily is providing 1900 kcal (73% est kcal needs), and 96 gram protein (69% est protein needs) -Discussed alternatives to increase pt's compliance of protein supplement with RN and Pharmacy.  -Will trial Pro-stat 60 ml BID and modify as warranted -One episode vomiting on 6/07-RN noted mostly salvia and is tolerating TF w/16ml residuals  6/09: -Vital 1.5 at 50 ml/hr, tolerating with 0 ml residuals. Having some vomiting of mucous. Receiving magic mouthwash -Received Pro-Stat 60 ml BID, will continue this regimen vs 30 ml QID to encourage supplement compliance -Pt to undergo radiation today -W/out BM for 3 days, daily miralax started -IVF at 50 ml/hr for renal failure, BUN/Crt elevated but improving  6/10: -Pt reports consuming and tolerating ProStat 60 ml BID; however per discussion with RN, pt refusing. -Tolerating TF, pt noted some mucous that is relieved by Mucinex.  -Discussed pt with outpatient RD concerning pt's d/c nutrition needs. Will trial Osmolite 1.5 as this would be more affordable/accessible than the specialty formula  Vital 1.5. If pt unable to tolerate, will need to be d/c on Vital 1.5. - Outpatient RD noted that [facility] will likely assist pt with providing TF -[facility] unable to provide pt with TF at this time.  6/11: -Pt tolerating Osmolite 1.5 at 50 ml/hr w/out residuals and minimal nausea/muscous. Recommend to continue Osmolite 1.5 formula w/ProStat 60 ml BID on D/C. -Has refused both doses of Pro-Stat yesterday per nursing staff; however continues to report to RD that he consistently takes them. RD continues to affirm and educate pt on importance of supplements on a daily basis -RD did confirm with RN that pt did tolerate one 60 ml dose of Pro-Stat this morning 6/11. RN stated that pt was able to verbalize the importance of taking supplement. Encourage Nurse Tech and RN to continue to relay importance of Pro-Stat to meet >75% estimated nutrition needs   Height: Ht Readings from Last 1 Encounters:  09/12/13 6\' 7"  (2.007 m)    Weight Status:   Wt Readings from Last 1 Encounters:  09/25/13 298 lb 4.8 oz (135.308 kg)  09/12/13 298 lbs  Re-estimated needs:  Kcal: 2600 - 2800  Protein: 140 - 160 grams  Fluid: 2.8 L fluid    Skin: chest incision, abdomen incision    Diet Order: Full Liquid   Intake/Output Summary (Last 24 hours) at 09/26/13 1616 Last data filed at 09/26/13 1020  Gross per 24 hour  Intake   1714 ml  Output   1225 ml  Net    489 ml    Last BM: 6/07   Labs:   Recent Labs Lab 09/20/13 0500 09/22/13 0530 09/23/13 0530  NA 139 136* 136*  K 3.8 3.6* 3.8  CL 99 97 96  CO2 25 24 26   BUN 32* 26* 25*  CREATININE 1.71* 1.43* 1.40*  CALCIUM 8.2* 8.8 8.9  GLUCOSE 114* 115* 97    CBG (last 3)   Recent Labs  09/23/13 2009 09/24/13 0747  GLUCAP 92 124*    Scheduled Meds: . amLODipine  10 mg Oral Daily  . chlorhexidine  15 mL Mouth/Throat QID  . enoxaparin (LOVENOX) injection  40 mg Subcutaneous Q24H  . feeding supplement (PRO-STAT SUGAR FREE 64)  60 mL  Per Tube BID BM  . guaiFENesin  1,200 mg Oral BID  . hydrALAZINE  25 mg Oral 3 times per day  . magic mouthwash w/lidocaine  10 mL Oral QID  . polyethylene glycol  17 g Oral Daily    Continuous Infusions: . sodium chloride 50 mL/hr at 09/26/13 0530  . feeding supplement (OSMOLITE 1.5 CAL) 1,000 mL (09/26/13 1036)    Atlee Abide MS RD LDN Clinical Dietitian [phone]

## 2013-09-27 ENCOUNTER — Ambulatory Visit
Admission: RE | Admit: 2013-09-27 | Discharge: 2013-09-27 | Disposition: A | Discharge status: Home / Self Care | Payer: Medicaid Other | Source: Ambulatory Visit | Attending: Radiation Oncology | Admitting: Radiation Oncology

## 2013-09-27 DIAGNOSIS — T451X5A Adverse effect of antineoplastic and immunosuppressive drugs, initial encounter: Secondary | ICD-10-CM

## 2013-09-27 DIAGNOSIS — R071 Chest pain on breathing: Secondary | ICD-10-CM

## 2013-09-27 DIAGNOSIS — D61810 Antineoplastic chemotherapy induced pancytopenia: Secondary | ICD-10-CM

## 2013-09-27 LAB — COMPREHENSIVE METABOLIC PANEL
ALBUMIN: 3.1 g/dL — AB (ref 3.5–5.2)
ALT: 42 U/L (ref 0–53)
AST: 20 U/L (ref 0–37)
Alkaline Phosphatase: 82 U/L (ref 39–117)
BILIRUBIN TOTAL: 0.3 mg/dL (ref 0.3–1.2)
BUN: 22 mg/dL (ref 6–23)
CHLORIDE: 97 meq/L (ref 96–112)
CO2: 28 meq/L (ref 19–32)
Calcium: 8.8 mg/dL (ref 8.4–10.5)
Creatinine, Ser: 1.23 mg/dL (ref 0.50–1.35)
GFR calc Af Amer: 80 mL/min — ABNORMAL LOW (ref >90)
GFR calc non Af Amer: 69 mL/min — ABNORMAL LOW (ref >90)
GLUCOSE: 125 mg/dL — AB (ref 70–99)
Potassium: 4.4 mEq/L (ref 3.7–5.3)
SODIUM: 133 meq/L — AB (ref 137–147)
TOTAL PROTEIN: 6.5 g/dL (ref 6.0–8.3)

## 2013-09-27 MED ORDER — HYDRALAZINE HCL 50 MG PO TABS
50.0000 mg | ORAL_TABLET | Freq: Three times a day (TID) | ORAL | Status: DC
  Administered 2013-09-27 – 2013-10-04 (×22): 50 mg via ORAL
  Filled 2013-09-27 (×24): qty 1

## 2013-09-27 MED ORDER — DOCUSATE SODIUM 100 MG PO CAPS
100.0000 mg | ORAL_CAPSULE | Freq: Two times a day (BID) | ORAL | Status: DC
  Administered 2013-09-27 – 2013-09-28 (×3): 100 mg via ORAL
  Filled 2013-09-27 (×4): qty 1

## 2013-09-27 NOTE — Progress Notes (Signed)
Mason [name]   DOB:05/24/1965   EM#:[id number]   OFH#:[id number]  I have seen the patient, examined him and edited the notes as follows   Subjective:  Afebrile this morning. His nausea improved significantly upon tube feeding adjustments, now at 50 ml since this morning.Dry heaves are worse when secretions build up, resolved when spitting. No further vomiting. He admits to have strong gag reflex prior to hospitalization. He is trying to increase his oral intake, with small bites at a time, but have not done so on prior day, afraid nausea would worsen. Denies any respiratory or cardiac complaints.Denies lower extremity swelling.  Last bowel movement on 6/7 without blood in the stools. He reports "no need to go to the bathroom".He refuses Miralax to date.  No confusion. He received radiation on 6/4, to continue as planned. Increasing mobility with walker.   Scheduled Meds: . amLODipine  10 mg Oral Daily  . chlorhexidine  15 mL Mouth/Throat QID  . enoxaparin (LOVENOX) injection  40 mg Subcutaneous Q24H  . feeding supplement (PRO-STAT SUGAR FREE 64)  60 mL Per Tube BID BM  . guaiFENesin  1,200 mg Oral BID  . hydrALAZINE  25 mg Oral 3 times per day  . magic mouthwash w/lidocaine  10 mL Oral QID  . metoprolol tartrate  25 mg Oral BID  . polyethylene glycol  17 g Oral Daily   Continuous Infusions: . sodium chloride 50 mL/hr at 09/27/13 0134  . feeding supplement (OSMOLITE 1.5 CAL) 1,000 mL (09/26/13 1036)   PRN Meds:.acetaminophen, docusate sodium, HYDROmorphone (DILAUDID) injection, lidocaine-prilocaine, LORazepam, metoprolol, ondansetron (ZOFRAN) IV, ondansetron (ZOFRAN) IV, ondansetron, ondansetron, oxyCODONE, promethazine, senna-docusate, sucralfate  Objective:  Filed Vitals:   09/27/13 0616  BP: 151/87  Pulse: 93  Temp: 98.4 F (36.9 C)  Resp: 18    Body mass index is 33.43 kg/(m^2).  Intake/Output Summary (Last 24 hours) at 09/27/13 0709 Last data filed at 09/27/13 0617  Gross  per 24 hour  Intake     40 ml  Output    900 ml  Net   -860 ml    GENERAL: alert, no distress, comfortable this morning  SKIN: skin color, texture, turgor are normal, no rashes or significant lesions EYES: normal, conjunctiva are pink and non-injected, sclera clear OROPHARYNX: no exudate, no erythema and lips, buccal mucosa, and tongue normal. No mucositis NECK: supple, thyroid normal size, non-tender, without nodularity LYMPH:  Persistent palpable lymphadenopathy in the neck, improved compared to baseline. LUNGS: clear to auscultation and percussion with normal breathing effort. Left port normal HEART: regular rate & rhythm and no murmurs with trace bilateral lower extremity edema. ABDOMEN: abdomen soft, non-tender and normal bowel sounds. Feeding tube normal.  Musculoskeletal: no cyanosis of digits and no clubbing  PSYCH: alert & oriented x 3 with fluent speech NEURO: no focal motor/sensory deficits    CBG (last 3)   Recent Labs  09/24/13 0747 09/26/13 0756  GLUCAP 124* 94     Labs:   Recent Labs Lab 09/26/13 0510  WBC 3.9*  HGB 12.6*  HCT 37.7*  PLT 103*  MCV 87.7  MCH 29.3  MCHC 33.4  RDW 13.5  LYMPHSABS 0.5*  MONOABS 0.2  EOSABS 0.1  BASOSABS 0.0     Chemistries:    Recent Labs Lab 09/22/13 0530 09/23/13 0530 09/27/13 0535  NA 136* 136* 133*  K 3.6* 3.8 4.4  CL 97 96 97  CO2 24 26 28   GLUCOSE 115* 97 125*  BUN 26* 25*  22  CREATININE 1.43* 1.40* 1.23  CALCIUM 8.8 8.9 8.8  AST 35 38* 20  ALT 93* 106* 42  ALKPHOS 86 90 82  BILITOT 0.4 0.4 0.3     Liver Function Tests:  Recent Labs Lab 09/22/13 0530 09/23/13 0530 09/27/13 0535  AST 35 38* 20  ALT 93* 106* 42  ALKPHOS 86 90 82  BILITOT 0.4 0.4 0.3  PROT 6.7 6.6 6.5  ALBUMIN 3.2* 3.2* 3.1*   CBG:  Recent Labs Lab 09/20/13 1739 09/20/13 2007 09/23/13 2009 09/24/13 0747 09/26/13 0756  GLUCAP 143* 104* 92 124* 94      Assessment/Plan:  #1 locally advanced tonsil cancer   The patient is not able to tolerate outpatient management. He can continue radiation therapy while hospitalized. On 6/1 had to be placed on hold due to vomiting. Reinforced the importance of radiation to the patient. As symptoms began to improve, radiation was reinitiated on 6/4. To continue today, with lorazepam half an hour before his planned radiation for nausea and anxiety control. Appreciate Dr. Pearlie Oyster (Radiation Oncology) follow up.   #2 uncontrolled nausea and vomiting with gagging  Patient reported that his mucus buildup caused these symptoms He was placed on  IV fluids and IV anti-emetics around the lock with promethazine IV Q6, zofran IV Q8 prn as this has worked well in prior admission.  Mucinex was increased on 6/1 to decrease gagging.  Scopolamine was tried on 6/2, but discontinued due to dizziness and dry mouth as side effects.  Daily dexamethasone was added for to help with nausea with adequate results. Last dose on 6/6, plan to hold further doses Patient complained that feeding tube is contributing to nausea. His tube feeding was placed on hold on 6/3. On 6/4 it continued at a lower rate, initially at 30 ml, now at 50 ml per hour, initiated on 6/8.  CT scan of the head without contrast was negative for brain metastasis. However, it was positive for scattered sinus disease and mucosal thickening versus nasal polyps. Scheduled promethazine restarted and later discontinued. On 6/10 Phenergan was changed to as needed instead of scheduled dose. Plan to wean off scheduled IV antiemetics in anticipation of possible discharge in the near future.  #3 severe dehydration with recent renal failure  Secondary to severe vomiting and dehydration Placed on IV fluid resuscitation, at 150 ml/hour. Renal failure worsened as of 6/3 with BUN and Creatinine increasing. May be related to steroid initiation.  As of 6/5, there is a slight improvement of his BUN and Creatinine.  As of 6/7, his IV fluids  were reduced to 50 cc/hour, as his renal functions continue to improve. Follow present therapy. His renal functions are now normal. Plan to continue on current IV fluids rate until discharge date.  #4 weight loss with malnutrition  Nutrition service consultation appreciated. PEG tube feeding had been increased to goal 90 ml/hr, then placed on hold. Patient complained that feeding tube was contributing to nausea. Total parenteral nutrition was considered as he continued to have nausea and vomiting. His rate was decreased to 30 ml/hr with better tolerance, now at its goal rate of 50 ml/hr as of 6/8. Patient is trying to increase oral intake as tolerated.  Will continue to monitor.  #5 DVT prophylaxis  On Lovenox. Of note, patient refused 5/30 evening dose. Importance of Lovenox injection to reduce risk of thrombosis was discussed. He agreed to continue Lovenox injections daily.  #6 Hypoglycemia, resolved Due to decreased oral intake. Stable today  Monitor closely  #7 Fever Likely reactive to inflammation and pain.  Resolved with antipyretics.  #8 Bilateral lower extremity swelling This is acute. May have been  exacerbated by IV fluids, malnutrition and decreased ambulation. Low suspicion for clot as patient is on Lovenox anticoagulation.  Improving. Continue to elevate legs and ambulate as tolerated.   IV fluids were decreased once renal function improved with improvement of this edema. serum albumin was therapeutically increasing since admission at 3.2 (on 6/8), currently at 3.1.   #9 CODE STATUS  Full code   #10 elevated liver function test  Likely related to recent treatment.  Resolved based on latest labs  #11 Mild pancytopenia This is likely related to recent therapy. The patient denies recent history of bleeding such as epistaxis, hematuria or hematochezia. He is asymptomatic from the pancytopenia. We will observe for now. He does not require transfusion now.   #12  Constipation Due to decreased mobility, dehydration, pain meds and poor oral intake. Last bowel movement on 6/7 Miralax added on 6/9 but patient refused. Importance of bowel evacuation was explained, patient agreed to take med today. He wants to avoid enema as if possible.  #13 Hypertension:  This was not well controlled with hydralazine and metroprolol for which amlodipine was added to the regimen on 6/5 at 10 mg daily, with blood pressure goal at 140/90. Blood pressure still elevated. Will monitor for now, may need increase of dose again. This is likely due to fluid retention.  Despite discontinuation of dexamethasone and reduction in IV fluids rate, no improvement was seen Metoprolol was added on 6/11, will check closely  #14: Hypokalemia In the setting of vomiting and diuretics for edema and hypertension. He received Kdur 20 meq on 6/7 with resolution of electrolyte inbalance.  #15 intermittent chest wall pain This is due to coughing. I recommend he try oral pain medicine first if possible in an attempt to wean him off intravenous medicines.  #16 discharge planning Will plan for discharge early next week if nausea, vomiting are better controlled and tube feeding is tolerated at a goal rate.  In addition, would be ideal to have patient discharged with blood pressure achieving its goal at 140/90 and the ability to tolerate oral antiemetics.   **Disclaimer: This note was dictated with voice recognition software. Similar sounding words can inadvertently be transcribed and this note may contain transcription errors which may not have been corrected upon publication of note.Sharene Butters E, PA-C 09/27/2013  7:09 AM  Shelle Galdamez, MD 09/27/2013

## 2013-09-27 NOTE — Progress Notes (Signed)
[facility] Therapy Treatment Record Phone [phone]   Radiation Therapy was administered to Mason [name] on: 09/27/2013  4:43 PM and was treatment # 24 out of a planned course of 35 treatments.

## 2013-09-28 DIAGNOSIS — J329 Chronic sinusitis, unspecified: Secondary | ICD-10-CM

## 2013-09-28 MED ORDER — DOCUSATE SODIUM 100 MG PO CAPS
200.0000 mg | ORAL_CAPSULE | Freq: Two times a day (BID) | ORAL | Status: DC
  Administered 2013-09-28 – 2013-10-03 (×10): 200 mg via ORAL
  Filled 2013-09-28 (×14): qty 2

## 2013-09-28 NOTE — Progress Notes (Signed)
GRAIDEN HENES   DOB:[birth year]   SP#:[id number]   QJF#:[id number]  Subjective: patient in bed covered up in blanket; denies pain except when he vomits (x1 so far today); nonBM x 2 days; trying to swallow pills; otherwise "everything is OK." No family in room   Objective: middle aged 48 American man examined in bed Filed Vitals:   09/28/13 0617  BP: 164/94  Pulse:   Temp:   Resp:     Body mass index is 33.43 kg/(m^2).  Intake/Output Summary (Last 24 hours) at 09/28/13 1336 Last data filed at 09/28/13 1012  Gross per 24 hour  Intake    700 ml  Output   1675 ml  Net   -975 ml     Oropharynx no thrush  Lungs clear -- auscultated anterolaterally  Heart regular rate and rhythm  Abdomen soft, +BS, PEG in place, feeds running at 50 cc/h  Neuro nonfocal, well oriented, positive affect    Urine in urinal not obviously concentrated  CBG (last 3)   Recent Labs  09/26/13 0756  GLUCAP 94     Labs:  Lab Results  Component Value Date   WBC 3.9* 09/26/2013   HGB 12.6* 09/26/2013   HCT 37.7* 09/26/2013   MCV 87.7 09/26/2013   PLT 103* 09/26/2013   NEUTROABS 3.1 09/26/2013    @LASTCHEMISTRY @  Urine Studies No results found for this basename: UACOL, UAPR, USPG, UPH, UTP, UGL, UKET, UBIL, UHGB, UNIT, UROB, ULEU, UEPI, UWBC, URBC, UBAC, CAST, CRYS, UCOM, BILUA,  in the last 72 hours  Basic Metabolic Panel:  Recent Labs Lab 09/22/13 0530 09/23/13 0530 09/27/13 0535  NA 136* 136* 133*  K 3.6* 3.8 4.4  CL 97 96 97  CO2 24 26 28   GLUCOSE 115* 97 125*  BUN 26* 25* 22  CREATININE 1.43* 1.40* 1.23  CALCIUM 8.8 8.9 8.8   GFR Estimated Creatinine Clearance: 116.9 ml/min (by C-G formula based on Cr of 1.23). Liver Function Tests:  Recent Labs Lab 09/22/13 0530 09/23/13 0530 09/27/13 0535  AST 35 38* 20  ALT 93* 106* 42  ALKPHOS 86 90 82  BILITOT 0.4 0.4 0.3  PROT 6.7 6.6 6.5  ALBUMIN 3.2* 3.2* 3.1*   No results found for this basename: LIPASE, AMYLASE,  in the  last 168 hours No results found for this basename: AMMONIA,  in the last 168 hours Coagulation profile No results found for this basename: INR, PROTIME,  in the last 168 hours  CBC:  Recent Labs Lab 09/26/13 0510  WBC 3.9*  NEUTROABS 3.1  HGB 12.6*  HCT 37.7*  MCV 87.7  PLT 103*   Cardiac Enzymes: No results found for this basename: CKTOTAL, CKMB, CKMBINDEX, TROPONINI,  in the last 168 hours BNP: No components found with this basename: POCBNP,  CBG:  Recent Labs Lab 09/23/13 2009 09/24/13 0747 09/26/13 0756  GLUCAP 92 124* 94   D-Dimer No results found for this basename: DDIMER,  in the last 72 hours Hgb A1c No results found for this basename: HGBA1C,  in the last 72 hours Lipid Profile No results found for this basename: CHOL, HDL, LDLCALC, TRIG, CHOLHDL, LDLDIRECT,  in the last 72 hours Thyroid function studies No results found for this basename: TSH, T4TOTAL, FREET3, T3FREE, THYROIDAB,  in the last 72 hours Anemia work up No results found for this basename: VITAMINB12, FOLATE, FERRITIN, TIBC, IRON, RETICCTPCT,  in the last 72 hours Microbiology No results found for this or any previous visit (from the  past 240 hour(s)).    Studies:  No results found.  Assessment: 48 y.o. 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 Increased docusate as only change. Patient knows to have RN contact us PRN any other problems  MAGRINAT,GUSTAV C, MD 09/28/2013  1:36  PM

## 2013-09-29 MED ORDER — LORATADINE 10 MG PO TABS
10.0000 mg | ORAL_TABLET | Freq: Every day | ORAL | Status: DC
  Administered 2013-09-29 – 2013-10-04 (×5): 10 mg via ORAL
  Filled 2013-09-29 (×7): qty 1

## 2013-09-29 NOTE — Progress Notes (Signed)
Mason [name]   DOB:11/09/1965   PO#:[id number]   ERX#:[id number]  Subjective: more comfortable this AM; pain only when vomits, only vomits when trying to cough up phlegm; this happened x1 overnight; able to take med po well; still no BM; does not want laxative at this time but "I will ask if I need it." No family in room   Objective: middle aged African American man examined in bed Filed Vitals:   09/29/13 0520  BP: 146/101  Pulse: 100  Temp: 98.5 F (36.9 C)  Resp: 16    Body mass index is 32.76 kg/(m^2).  Intake/Output Summary (Last 24 hours) at 09/29/13 0725 Last data filed at 09/28/13 1930  Gross per 24 hour  Intake    947 ml  Output   1500 ml  Net   -553 ml    Oropharynx: no obvious lesions  Lungs clear -- auscultated anterolaterally  Heart regular rate and rhythm  Abdomen soft, +BS, PEG in place, feeds running well  Neuro nonfocal, well oriented, positive affect  Port Left upper anterior chest, intact      CBG (last 3)   Recent Labs  09/26/13 0756  GLUCAP 94     Labs:  Lab Results  Component Value Date   WBC 3.9* 09/26/2013   HGB 12.6* 09/26/2013   HCT 37.7* 09/26/2013   MCV 87.7 09/26/2013   PLT 103* 09/26/2013   NEUTROABS 3.1 09/26/2013    @LASTCHEMISTRY @  Urine Studies No results found for this basename: UACOL, UAPR, USPG, UPH, UTP, UGL, UKET, UBIL, UHGB, UNIT, UROB, ULEU, UEPI, UWBC, URBC, UBAC, CAST, CRYS, UCOM, BILUA,  in the last 72 hours  Basic Metabolic Panel:  Recent Labs Lab 09/23/13 0530 09/27/13 0535  NA 136* 133*  K 3.8 4.4  CL 96 97  CO2 26 28  GLUCOSE 97 125*  BUN 25* 22  CREATININE 1.40* 1.23  CALCIUM 8.9 8.8   GFR Estimated Creatinine Clearance: 115.7 ml/min (by C-G formula based on Cr of 1.23). Liver Function Tests:  Recent Labs Lab 09/23/13 0530 09/27/13 0535  AST 38* 20  ALT 106* 42  ALKPHOS 90 82  BILITOT 0.4 0.3  PROT 6.6 6.5  ALBUMIN 3.2* 3.1*   No results found for this basename: LIPASE, AMYLASE,  in the  last 168 hours No results found for this basename: AMMONIA,  in the last 168 hours Coagulation profile No results found for this basename: INR, PROTIME,  in the last 168 hours  CBC:  Recent Labs Lab 09/26/13 0510  WBC 3.9*  NEUTROABS 3.1  HGB 12.6*  HCT 37.7*  MCV 87.7  PLT 103*   Cardiac Enzymes: No results found for this basename: CKTOTAL, CKMB, CKMBINDEX, TROPONINI,  in the last 168 hours BNP: No components found with this basename: POCBNP,  CBG:  Recent Labs Lab 09/23/13 2009 09/24/13 0747 09/26/13 0756  GLUCAP 92 124* 94   D-Dimer No results found for this basename: DDIMER,  in the last 72 hours Hgb A1c No results found for this basename: HGBA1C,  in the last 72 hours Lipid Profile No results found for this basename: CHOL, HDL, LDLCALC, TRIG, CHOLHDL, LDLDIRECT,  in the last 72 hours Thyroid function studies No results found for this basename: TSH, T4TOTAL, FREET3, T3FREE, THYROIDAB,  in the last 72 hours Anemia work up No results found for this basename: VITAMINB12, FOLATE, FERRITIN, TIBC, IRON, RETICCTPCT,  in the last 72 hours Microbiology No results found for this or any previous visit (from the  past 240 hour(s)).    Studies:  No results found.  Assessment: 48 y.o. 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  Full code.  Chauncey Cruel, MD 09/29/2013  7:25 AM

## 2013-09-30 ENCOUNTER — Encounter: Payer: Self-pay | Admitting: Radiation Oncology

## 2013-09-30 ENCOUNTER — Ambulatory Visit
Admission: RE | Admit: 2013-09-30 | Discharge: 2013-09-30 | Disposition: A | Discharge status: Home / Self Care | Payer: Medicaid Other | Source: Ambulatory Visit | Attending: Radiation Oncology | Admitting: Radiation Oncology

## 2013-09-30 LAB — CBC WITH DIFFERENTIAL/PLATELET
BASOS ABS: 0 10*3/uL (ref 0.0–0.1)
Basophils Relative: 0 % (ref 0–1)
EOS PCT: 2 % (ref 0–5)
Eosinophils Absolute: 0 10*3/uL (ref 0.0–0.7)
HEMATOCRIT: 38.4 % — AB (ref 39.0–52.0)
Hemoglobin: 12.8 g/dL — ABNORMAL LOW (ref 13.0–17.0)
LYMPHS PCT: 13 % (ref 12–46)
Lymphs Abs: 0.3 10*3/uL — ABNORMAL LOW (ref 0.7–4.0)
MCH: 29.2 pg (ref 26.0–34.0)
MCHC: 33.3 g/dL (ref 30.0–36.0)
MCV: 87.5 fL (ref 78.0–100.0)
MONO ABS: 0.2 10*3/uL (ref 0.1–1.0)
Monocytes Relative: 8 % (ref 3–12)
Neutro Abs: 1.6 10*3/uL — ABNORMAL LOW (ref 1.7–7.7)
Neutrophils Relative %: 77 % (ref 43–77)
Platelets: 143 10*3/uL — ABNORMAL LOW (ref 150–400)
RBC: 4.39 MIL/uL (ref 4.22–5.81)
RDW: 13.6 % (ref 11.5–15.5)
WBC: 2.1 10*3/uL — AB (ref 4.0–10.5)

## 2013-09-30 LAB — COMPREHENSIVE METABOLIC PANEL
ALT: 43 U/L (ref 0–53)
AST: 24 U/L (ref 0–37)
Albumin: 3.4 g/dL — ABNORMAL LOW (ref 3.5–5.2)
Alkaline Phosphatase: 101 U/L (ref 39–117)
BUN: 24 mg/dL — ABNORMAL HIGH (ref 6–23)
CO2: 26 meq/L (ref 19–32)
CREATININE: 1.27 mg/dL (ref 0.50–1.35)
Calcium: 9.4 mg/dL (ref 8.4–10.5)
Chloride: 93 mEq/L — ABNORMAL LOW (ref 96–112)
GFR, EST AFRICAN AMERICAN: 77 mL/min — AB (ref >90)
GFR, EST NON AFRICAN AMERICAN: 66 mL/min — AB (ref >90)
Glucose, Bld: 124 mg/dL — ABNORMAL HIGH (ref 70–99)
Potassium: 4.9 mEq/L (ref 3.7–5.3)
Sodium: 132 mEq/L — ABNORMAL LOW (ref 137–147)
Total Bilirubin: 0.3 mg/dL (ref 0.3–1.2)
Total Protein: 7.5 g/dL (ref 6.0–8.3)

## 2013-09-30 MED ORDER — LACTULOSE 10 GM/15ML PO SOLN
30.0000 g | Freq: Three times a day (TID) | ORAL | Status: DC
  Administered 2013-09-30 – 2013-10-02 (×4): 30 g via ORAL
  Filled 2013-09-30 (×14): qty 45

## 2013-09-30 MED ORDER — METOPROLOL TARTRATE 25 MG/10 ML ORAL SUSPENSION
50.0000 mg | Freq: Two times a day (BID) | ORAL | Status: DC
  Administered 2013-09-30 – 2013-10-04 (×8): 50 mg via ORAL
  Filled 2013-09-30 (×10): qty 20

## 2013-09-30 MED ORDER — FENTANYL 25 MCG/HR TD PT72
25.0000 ug | MEDICATED_PATCH | TRANSDERMAL | Status: DC
  Administered 2013-09-30: 25 ug via TRANSDERMAL
  Filled 2013-09-30: qty 1

## 2013-09-30 MED ORDER — OXYCODONE HCL 5 MG PO TABS
15.0000 mg | ORAL_TABLET | ORAL | Status: DC | PRN
  Administered 2013-09-30 – 2013-10-04 (×16): 15 mg via ORAL
  Filled 2013-09-30 (×21): qty 3

## 2013-09-30 MED ORDER — SENNOSIDES-DOCUSATE SODIUM 8.6-50 MG PO TABS
2.0000 | ORAL_TABLET | Freq: Two times a day (BID) | ORAL | Status: DC
  Administered 2013-09-30 – 2013-10-03 (×7): 2 via ORAL
  Filled 2013-09-30 (×13): qty 2

## 2013-09-30 NOTE — Progress Notes (Signed)
   Weekly Management Note:  Inpatient Current Dose:  50 Gy  Projected Dose:70Gy   Narrative:  The patient presents for routine under treatment assessment.  CBCT/MVCT images/Port film x-rays were reviewed.  The chart was checked. He reports feeling a little better.  Still nauseous, with thick secretions, but somewhat better.  He has been constipated x 1 wk with plans for Miralax, Senokot, lactulose. Enema refused.  Physical Findings:  height is 6\' 7"  (2.007 m) and weight is 288 lb 6.4 oz (130.817 kg). His oral temperature is 98.2 F (36.8 C). His blood pressure is 153/100 and his pulse is 98. His respiration is 20 and oxygen saturation is 96%.  no oral thrush. Oral mucosa is somewhat dry. Hyperpigmented skin over neck, skin intact and dry.  CBC    Component Value Date/Time   WBC 2.1* 09/30/2013 0522   WBC 4.2 09/06/2013 1438   RBC 4.39 09/30/2013 0522   RBC 4.10* 09/06/2013 1438   HGB 12.8* 09/30/2013 0522   HGB 12.0* 09/06/2013 1438   HCT 38.4* 09/30/2013 0522   HCT 36.9* 09/06/2013 1438   PLT 143* 09/30/2013 0522   PLT 295 09/06/2013 1438   MCV 87.5 09/30/2013 0522   MCV 90.0 09/06/2013 1438   MCH 29.2 09/30/2013 0522   MCH 29.2 09/06/2013 1438   MCHC 33.3 09/30/2013 0522   MCHC 32.5 09/06/2013 1438   RDW 13.6 09/30/2013 0522   RDW 13.1 09/06/2013 1438   LYMPHSABS 0.3* 09/30/2013 0522   LYMPHSABS 0.6* 09/06/2013 1438   MONOABS 0.2 09/30/2013 0522   MONOABS 0.6 09/06/2013 1438   EOSABS 0.0 09/30/2013 0522   EOSABS 0.1 09/06/2013 1438   BASOSABS 0.0 09/30/2013 0522   BASOSABS 0.0 09/06/2013 1438     CMP     Component Value Date/Time   NA 132* 09/30/2013 0522   NA 137 09/06/2013 1438   K 4.9 09/30/2013 0522   K 4.7 09/06/2013 1438   CL 93* 09/30/2013 0522   CO2 26 09/30/2013 0522   CO2 29 09/06/2013 1438   GLUCOSE 124* 09/30/2013 0522   GLUCOSE 77 09/06/2013 1438   BUN 24* 09/30/2013 0522   BUN 10.6 09/06/2013 1438   CREATININE 1.27 09/30/2013 0522   CREATININE 1.2 09/06/2013 1438   CALCIUM 9.4  09/30/2013 0522   CALCIUM 9.5 09/06/2013 1438   PROT 7.5 09/30/2013 0522   PROT 7.2 09/06/2013 1438   ALBUMIN 3.4* 09/30/2013 0522   ALBUMIN 3.4* 09/06/2013 1438   AST 24 09/30/2013 0522   AST 31 09/06/2013 1438   ALT 43 09/30/2013 0522   ALT 46 09/06/2013 1438   ALKPHOS 101 09/30/2013 0522   ALKPHOS 83 09/06/2013 1438   BILITOT 0.3 09/30/2013 0522   BILITOT 0.34 09/06/2013 1438   GFRNONAA 66* 09/30/2013 0522   GFRAA 77* 09/30/2013 0522     Impression:  The patient is tolerating radiotherapy.   Plan:  Continue radiotherapy as planned.   Continue current meds and biafine. Patient expressed appreciation for his care.  I appreciate the dedication of heme/onc and the inpatient staff in helping him through his treatments.  -----------------------------------  Eppie Gibson, MD

## 2013-09-30 NOTE — Progress Notes (Signed)
Patient reported to MD that he is unable to bathe himself.  Nursing assistant came into room and offered to bathe patient, patient stated "oh don't worry about that."

## 2013-09-30 NOTE — Progress Notes (Signed)
Mason [name]   DOB:16-Jul-1965   WI#:[id number]   MEQ#:[id number]   I have seen the patient, examined him and edited the notes as follows  Subjective:  Afebrile this morning. His nausea improved significantly upon tube feeding adjustments, now at 50 ml .Dry heaves are worse when secretions build up, resolved when spitting.No vomiting. He admits to have strong gag reflex prior to hospitalization. He is trying to increase his oral intake, with small bites at a time, but have not done so on prior day, afraid nausea would worsen. Denies any respiratory or cardiac complaints.Denies lower extremity swelling.  Last bowel movement on 6/7 without blood in the stools.  He refuses Miralax to date.  No confusion. He received radiation on 6/4, to continue as planned.HAs not ambulated over the last 2 days . He has not bathed since admission. He appears more depressed this morning.  Scheduled Meds: . amLODipine  10 mg Oral Daily  . chlorhexidine  15 mL Mouth/Throat QID  . docusate sodium  200 mg Oral BID  . enoxaparin (LOVENOX) injection  40 mg Subcutaneous Q24H  . feeding supplement (PRO-STAT SUGAR FREE 64)  60 mL Per Tube BID BM  . guaiFENesin  1,200 mg Oral BID  . hydrALAZINE  50 mg Oral 3 times per day  . loratadine  10 mg Oral Daily  . magic mouthwash w/lidocaine  10 mL Oral QID  . metoprolol tartrate  25 mg Oral BID  . polyethylene glycol  17 g Oral Daily   Continuous Infusions: . sodium chloride 50 mL/hr at 09/30/13 0514  . feeding supplement (OSMOLITE 1.5 CAL) 1,000 mL (09/29/13 0611)   PRN Meds:.acetaminophen, HYDROmorphone (DILAUDID) injection, lidocaine-prilocaine, LORazepam, metoprolol, ondansetron (ZOFRAN) IV, ondansetron (ZOFRAN) IV, ondansetron, ondansetron, oxyCODONE, promethazine, senna-docusate, sucralfate  Objective:  Filed Vitals:   09/30/13 0522  BP: 143/103  Pulse: 110  Temp: 97.9 F (36.6 C)  Resp: 20    Body mass index is 32.48 kg/(m^2).  Intake/Output Summary (Last 24  hours) at 09/30/13 0949 Last data filed at 09/30/13 0844  Gross per 24 hour  Intake    703 ml  Output   2675 ml  Net  -1972 ml    GENERAL: alert, no distress, more disheveled this morning. SKIN: skin color, texture, turgor are normal, no rashes or significant lesions EYES: normal, conjunctiva are pink and non-injected, sclera clear OROPHARYNX: no exudate, no erythema and lips, buccal mucosa, and tongue normal. No mucositis NECK: supple, thyroid normal size, non-tender, without nodularity LYMPH:  Persistent palpable lymphadenopathy in the neck, improved compared to baseline. LUNGS: clear to auscultation and percussion with normal breathing effort. Left port normal HEART: regular rate & rhythm and no murmurs with trace bilateral lower extremity edema. ABDOMEN: abdomen soft, non-tender and normal bowel sounds. Feeding tube normal.  Musculoskeletal: no cyanosis of digits and no clubbing  PSYCH: alert & oriented x 3 with fluent speech NEURO: no focal motor/sensory deficits    CBG (last 3)  No results found for this basename: GLUCAP,  in the last 72 hours   Labs:   Recent Labs Lab 09/26/13 0510 09/30/13 0522  WBC 3.9* 2.1*  HGB 12.6* 12.8*  HCT 37.7* 38.4*  PLT 103* 143*  MCV 87.7 87.5  MCH 29.3 29.2  MCHC 33.4 33.3  RDW 13.5 13.6  LYMPHSABS 0.5* 0.3*  MONOABS 0.2 0.2  EOSABS 0.1 0.0  BASOSABS 0.0 0.0     Chemistries:    Recent Labs Lab 09/27/13 0535 09/30/13 0522  NA  133* 132*  K 4.4 4.9  CL 97 93*  CO2 28 26  GLUCOSE 125* 124*  BUN 22 24*  CREATININE 1.23 1.27  CALCIUM 8.8 9.4  AST 20 24  ALT 42 43  ALKPHOS 82 101  BILITOT 0.3 0.3     Liver Function Tests:  Recent Labs Lab 09/27/13 0535 09/30/13 0522  AST 20 24  ALT 42 43  ALKPHOS 82 101  BILITOT 0.3 0.3  PROT 6.5 7.5  ALBUMIN 3.1* 3.4*   CBG:  Recent Labs Lab 09/23/13 2009 09/24/13 0747 09/26/13 0756  GLUCAP 92 124* 94      Assessment/Plan:  #1 locally advanced tonsil cancer   s/p right tonsillar biopsy 07/09/2013 for a P16 positive squamous cell carcinoma, 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 He can continue radiation therapy while hospitalized. On 6/1 had to be placed on hold due to vomiting. Reinforced the importance of radiation to the patient. As symptoms began to improve, radiation was reinitiated on 6/4. To continue today, with lorazepam half an hour before his planned radiation for nausea and anxiety control. Appreciate Dr. Pearlie Oyster (Radiation Oncology) follow up.   #2 uncontrolled nausea and vomiting with gagging  Patient reported that his mucus buildup caused these symptoms He was placed on  IV fluids and IV anti-emetics around the lock with promethazine IV Q6, zofran IV Q8 prn as this has worked well in prior admission.  Mucinex was increased on 6/1 to decrease gagging.  Scopolamine was tried on 6/2, but discontinued due to dizziness and dry mouth as side effects.  Daily dexamethasone was added for to help with nausea with adequate results. Last dose on 6/6, plan to hold further doses Patient complained that feeding tube is contributing to nausea. His tube feeding was placed on hold on 6/3. On 6/4 it continued at a lower rate, initially at 30 ml, now at 50 ml per hour, initiated on 6/8.  CT scan of the head without contrast was negative for brain metastasis. However, it was positive for scattered sinus disease and mucosal thickening versus nasal polyps. Scheduled promethazine restarted and later discontinued. On 6/10 Phenergan was changed to as needed instead of scheduled dose. Scheduled IV antiemetics are being weaned off in anticipation of possible discharge in the near future.  #3 severe dehydration with recent renal failure  Secondary to severe vomiting and dehydration Placed on IV fluid resuscitation, at 150 ml/hour. Renal failure worsened as of 6/3 with BUN and  Creatinine increasing. May be related to steroid initiation.  As of 6/5, there is a slight improvement of his BUN and Creatinine.  As of 6/7, his IV fluids were reduced to 50 cc/hour, as his renal functions continue to improve. Follow present therapy. His renal functions are now normal. Plan to continue on current IV fluids rate until discharge date.  #4 weight loss with malnutrition  Nutrition service consultation appreciated. PEG tube feeding had been increased to goal 90 ml/hr, then placed on hold. Patient complained that feeding tube was contributing to nausea. Total parenteral nutrition was considered as he continued to have nausea and vomiting. His rate was decreased to 30 ml/hr with better tolerance, now at its goal rate of 50 ml/hr as of 6/8. Patient is trying to increase oral intake as tolerated.  Will continue to monitor.  #5 DVT prophylaxis  On Lovenox. Of note, patient refused 5/30 evening dose. Importance of Lovenox injection  to reduce risk of thrombosis was discussed. He agreed to continue Lovenox injections daily.  #6 Hypoglycemia, resolved Due to decreased oral intake. Stable today Monitor closely  #7 Fever Likely reactive to inflammation and pain.  Resolved with antipyretics.  #8 Bilateral lower extremity swelling This is acute. May have been  exacerbated by IV fluids, malnutrition and decreased ambulation. Low suspicion for clot as patient is on Lovenox anticoagulation.  Improving. Continue to elevate legs and ambulate as tolerated.   IV fluids were decreased once renal function improved with improvement of this edema. serum albumin was therapeutically increasing since admission at 3.2 (on 6/8), currently at 3.4.   #9 CODE STATUS  Full code   #10 elevated liver function test  Likely related to recent treatment.  Resolved based on latest labs  #11 Mild pancytopenia This is likely related to recent therapy. The patient denies recent history of bleeding such as  epistaxis, hematuria or hematochezia. He is asymptomatic from the pancytopenia. We will observe for now. He does not require transfusion now.   #12 Constipation Due to decreased mobility, dehydration, pain meds and poor oral intake. Last bowel movement on 6/7 Miralax added on 6/9 but patient refused. Importance of bowel evacuation was explained, patient  Initially agreed to take med and then refused again, including during this visit.  He wants to avoid enema as if possible. Will try again today. He is scheduled for lactulose, MiraLAX and Senokot.  #13 Hypertension:  This was not well controlled with hydralazine and metroprolol for which amlodipine was added to the regimen on 6/5 at 10 mg daily, with blood pressure goal at 140/90. Blood pressure still elevated. Will monitor for now, may need increase of dose again. This is likely due to fluid retention.  Despite discontinuation of dexamethasone and reduction in IV fluids rate, no improvement was seen Metoprolol was added on 6/11, still elevated. Will increase the dose.  #14: Hypokalemia In the setting of vomiting and diuretics for edema and hypertension. He received Kdur 20 meq on 6/7 with resolution of electrolyte inbalance.  #15 intermittent chest wall pain This is due to coughing. Recommend he try oral pain medicine first if possible in an attempt to wean him off intravenous medicines. I will add a fentanyl patch and increase immediate release oxycodone to 15 mg. I encouraged the patient to try oral medicine first before IV medicine.  #16 discharge planning Plans for discharge if nausea, vomiting are better controlled and tube feeding is tolerated at a goal rate.  In addition, would be ideal to have patient discharged with blood pressure achieving its goal at 140/90 and the ability to tolerate oral antiemetics and IV pain medicine..    **Disclaimer: This note was dictated with voice recognition software. Similar sounding words can  inadvertently be transcribed and this note may contain transcription errors which may not have been corrected upon publication of note.Sharene Butters E, PA-C 09/30/2013  9:49 AM Lauris Serviss, MD 09/30/2013

## 2013-10-01 ENCOUNTER — Ambulatory Visit
Admission: RE | Admit: 2013-10-01 | Discharge: 2013-10-01 | Disposition: A | Discharge status: Home / Self Care | Payer: Medicaid Other | Source: Ambulatory Visit | Attending: Radiation Oncology | Admitting: Radiation Oncology

## 2013-10-01 NOTE — Progress Notes (Signed)
Attempted to visit; pt was in bed; said they had just given him meds. Will attempt to follow-up later. Ernest Haber Chaplain  10/01/13 0900  Clinical Encounter Type  Visited With Patient

## 2013-10-01 NOTE — Progress Notes (Signed)
NUTRITION FOLLOW UP  Intervention: -Continue Osmolite 1.5 at goal rate of 50 ml/hr and Pro-Stat 60 ml BID to provide 2200 kcal (85% est kcal needs), 135 gram protein (96% est protein needs), 915 ml free water.  -Consider addition of Reglan for increased gastric motility and nausea relief -When pt with regular bowel movements and improvement in nausea, recommend to advance Osmolite 1.5 by 5 ml every 4 hours to new goal rate of 60 ml/hr and Pro-Stat 60 ml BID to meet minimum kcal needs -Osmolite 1.5 at new goal rate of 60 ml/hr with Pro-Stat 60 ml BID will provide 2560 kcal (98% est kcal needs), 150 gram protein (100% est protein needs)  Nutrition Dx:   Inadequate oral intake related to persistent nausea/vomiting as evidenced by pt report of frequent emesis after any intake-ongoing   Goal:   Pt to meet >/=90% of estimated nutrition needs -progressing with TF   Monitor:   TF tolerance, weight trend, labs, GI profile, total protein/energy intake   Assessment:   5/28: - Pt had PEG placed on 08/15/13  - Pt received cycle 2 chemotherapy on 09/10/2013  - Pt followed by [facility] has lost 30 lbs x 1 month (9.1%), which is severe for time frame  - Pt reports poor PO intake x 1 month; dietary intake includes things like pudding, grits, jello, Ensure  - Pt reports using his PEG tube for the first time last night and he put 1 Ensure Plus and water in the tube. Pt reports that he vomited after using the tube  - Pt still very nauseous at time of assessment  - Recommend using PEG for sole source of nutrition until pt able to meet >/=50% of minimum estimated needs by PO intake  No muscle or subcutaneous fat depletion noticed.  K, Mg WNL  5/29: -Pt has refused initiation of TF d/t ongoing nausea and vomiting episodes. RN reported pt refused last night and this morning as he had 3 episodes emesis this AM. -Per discussion with RN, pt has only tolerated sips of gingerale -MD  noted pt with mucous and gagging that exacerbates GI symptoms. Receiving Mucinex and phenergan. Possible TPN candidate if continues with intractable n/v and intolerance of EN. -Modified TF to start at lower trickle feed of 10 ml/hr to possibly assist in improving tolerance. Recommend Phos/Mg/K be monitored once pt able to tolerate initiation of TF  6/01: -Vital AF 1.2 currently running at 50 ml/hr, providing 1440 kcal (55% est kcal needs), and 90 gram protein (64% est protein needs). Pt tolerating w/out residuals. Pt refused further TF advancement on 5/31 -IVF running at 150 ml/hr -MD noted pt continues with nausea but no vomiting. Exhibits mucous/phlem/gagging that makes PO intake difficult/nonexistent.  PO intake 0% -Pt agreeable to trial advancement of TF to 60 ml/hr during time of RD follow. Declined any PO beverages. Informed RN to advance TF and assess tolerance.  -Recommend to continue to advance to Vital AF 1.2 to goal of 90 ml/hr as tolerated -K low -Renal function improving with hydration  6/03: -Pt vomited after Vital AF 1.2 was advanced to 70 ml/hr. TF was decreased to 50 ml/hr -Per RN, pt refused to restart TF this morning d/t nausea. MD also noted pt refusing radiation treatments since 6/01 -Discussed TF regimen with pt. He noted some improvement in nausea and is w/out vomiting. 0% PO intake d/t nausea/dry heaves -Pt may benefit from lower volume of more nutrient dense TF -  Pt in agreement to modify to higher calorie/protein formula Vital 1.5, with the addition of protein supplement Pro-Stat. -Vital 1.5 at goal rate of 50 ml/hr with Pro-stat QID will provide 85% est kcal needs, and 100% est protein needs.  -Will monitor TF tolerance and modify protein supplements as needed  6/04: -Vital 1.5 currently running at 30 ml/hr. This provides 1080 kcal (42% est kcal needs), and 49 gram protein (35% est protein needs). Received one Pro-Stat yesterday and tolerated w/out nausea -Pt reported  much improvement in nausea with TF adjustments. Was willing to continue with increase to goal rate of 50 ml/hr after radiation treatment today at 3pm -Discussed TF with RN. Encouraged to administer Pro-Stat via PEG to minimize nausea -0% PO intake -MD noted pt's elevated BUN/Crt may be related to steroids. Continuing with IVF at 150 ml/hr. -Will monitor TF advancement and increase Pro-Stat as warranted  6/05: -Vital 1.5 currently running at 40 ml/hr. This provides 1440 kcal (55% est kcal needs), 65 gram protein (46% est protein needs). Pt tolerating two Pro-Stat daily -Refused TF advancement on 6/05 -Pt noted he was going to trial advancement later today. Verbalized understanding in importance of receiving Pro-Stat QID to assist in meeting protein/kcal needs.  -Discussed w/RN. Was in agreement to encourage Pro-Stat and TF advancement as tolerated   6/08: -Pt tolerating Vital 1.5 at 50 ml/hr. Denied nausea/vomiting -Reports tolerance of Pro-Stat QID, however per discussion with RN, pt has been refusing Pro-Stat and likely consuming only 1-2 supplement daily.  -Vital 1.5 at 50 ml/hr with Pro-Stat once daily is providing 1900 kcal (73% est kcal needs), and 96 gram protein (69% est protein needs) -Discussed alternatives to increase pt's compliance of protein supplement with RN and Pharmacy.  -Will trial Pro-stat 60 ml BID and modify as warranted -One episode vomiting on 6/07-RN noted mostly salvia and is tolerating TF w/51ml residuals  6/09: -Vital 1.5 at 50 ml/hr, tolerating with 0 ml residuals. Having some vomiting of mucous. Receiving magic mouthwash -Received Pro-Stat 60 ml BID, will continue this regimen vs 30 ml QID to encourage supplement compliance -Pt to undergo radiation today -W/out BM for 3 days, daily miralax started -IVF at 50 ml/hr for renal failure, BUN/Crt elevated but improving  6/10: -Pt reports consuming and tolerating ProStat 60 ml BID; however per discussion with RN, pt  refusing. -Tolerating TF, pt noted some mucous that is relieved by Mucinex.  -Discussed pt with outpatient RD concerning pt's d/c nutrition needs. Will trial Osmolite 1.5 as this would be more affordable/accessible than the specialty formula Vital 1.5. If pt unable to tolerate, will need to be d/c on Vital 1.5. - Outpatient RD noted that [facility] will likely assist pt with providing TF -[facility] unable to provide pt with TF at this time.  6/11: -Pt tolerating Osmolite 1.5 at 50 ml/hr w/out residuals and minimal nausea/muscous. Recommend to continue Osmolite 1.5 formula w/ProStat 60 ml BID on D/C. -Has refused both doses of Pro-Stat yesterday per nursing staff; however continues to report to RD that he consistently takes them. RD continues to affirm and educate pt on importance of supplements on a daily basis -RD did confirm with RN that pt did tolerate one 60 ml dose of Pro-Stat this morning 6/11. RN stated that pt was able to verbalize the importance of taking supplement. Encourage Nurse Tech and RN to continue to relay importance of Pro-Stat to meet >75% estimated nutrition needs  6/16: -Pt w/out BM since 6/7. Was started on Miralax,  Senekot, and lactulose. Had BM on 6/15. -Consider addition of Reglan to assist in regular BMs, gastric motility, and improvement in nausea -Tolerating Osmolite 1.5 at 50 ml/hr. Pt taking Pro-Stat 60 ml via PEG. -RN noted pt continues with nausea and 0% PO intake.  -Weight trending down, likely d/t continued nausea/gagging, intolerance of TF and minimal PO intake -As tolerated, increase Osmolite 1.5 by 5 ml every 4 hour to new goal rate of 60 ml/hf with Pro-Stat 60 ml BID to meet > 90% estimated needs.   Height: Ht Readings from Last 1 Encounters:  09/12/13 6\' 7"  (2.007 m)    Weight Status:   Wt Readings from Last 1 Encounters:  10/01/13 289 lb 1.6 oz (131.135 kg)  09/12/13 298 lbs  Re-estimated needs:  Kcal: 2600 - 2800  Protein: 140 - 160 grams   Fluid: 2.8 L fluid    Skin: chest incision, abdomen incision    Diet Order: Full Liquid   Intake/Output Summary (Last 24 hours) at 10/01/13 0939 Last data filed at 10/01/13 0600  Gross per 24 hour  Intake 2311.67 ml  Output   1750 ml  Net 561.67 ml    Last BM: 6/15   Labs:   Recent Labs Lab 09/27/13 0535 09/30/13 0522  NA 133* 132*  K 4.4 4.9  CL 97 93*  CO2 28 26  BUN 22 24*  CREATININE 1.23 1.27  CALCIUM 8.8 9.4  GLUCOSE 125* 124*    CBG (last 3)  No results found for this basename: GLUCAP,  in the last 72 hours  Scheduled Meds: . amLODipine  10 mg Oral Daily  . chlorhexidine  15 mL Mouth/Throat QID  . docusate sodium  200 mg Oral BID  . enoxaparin (LOVENOX) injection  40 mg Subcutaneous Q24H  . feeding supplement (PRO-STAT SUGAR FREE 64)  60 mL Per Tube BID BM  . fentaNYL  25 mcg Transdermal Q72H  . guaiFENesin  1,200 mg Oral BID  . hydrALAZINE  50 mg Oral 3 times per day  . lactulose  30 g Oral TID  . loratadine  10 mg Oral Daily  . magic mouthwash w/lidocaine  10 mL Oral QID  . metoprolol tartrate  50 mg Oral BID  . polyethylene glycol  17 g Oral Daily  . senna-docusate  2 tablet Oral BID    Continuous Infusions: . sodium chloride 50 mL/hr at 09/30/13 2358  . feeding supplement (OSMOLITE 1.5 CAL) 1,000 mL (10/01/13 0804)    Atlee Abide MS RD LDN Clinical Dietitian [phone]

## 2013-10-01 NOTE — Progress Notes (Signed)
Second attempt to visit; pt still sleepy. Ernest Haber Chaplain  10/01/13 1200  Clinical Encounter Type  Visited With Patient

## 2013-10-01 NOTE — Progress Notes (Signed)
Mason [name]   DOB:12-07-1965   VZ#:[id number]   PIR#:[id number]  I have seen the patient, examined him and edited the notes as follows  Subjective:  Afebrile this morning. Nausea better controlled.Dry heaves are worse when secretions build up,resolved when spitting. Intermittently, he expectorates blood tinged sputum due to forceful emesis. He admits to have strong gag reflex prior to hospitalization. He is trying to increase his oral intake, with small bites at a time, but have not done so on prior day, afraid nausea would worsen. Denies any respiratory or cardiac complaints. Denies lower extremity swelling.  Last bowel movement on 6/15 after laxative intake, without blood in the stools. No confusion. He received radiation on 6/4, to continue as planned. Has not ambulated over the last 3 days . He is in better spirits this morning.   Scheduled Meds: . amLODipine  10 mg Oral Daily  . chlorhexidine  15 mL Mouth/Throat QID  . docusate sodium  200 mg Oral BID  . enoxaparin (LOVENOX) injection  40 mg Subcutaneous Q24H  . feeding supplement (PRO-STAT SUGAR FREE 64)  60 mL Per Tube BID BM  . fentaNYL  25 mcg Transdermal Q72H  . guaiFENesin  1,200 mg Oral BID  . hydrALAZINE  50 mg Oral 3 times per day  . lactulose  30 g Oral TID  . loratadine  10 mg Oral Daily  . magic mouthwash w/lidocaine  10 mL Oral QID  . metoprolol tartrate  50 mg Oral BID  . polyethylene glycol  17 g Oral Daily  . senna-docusate  2 tablet Oral BID   Continuous Infusions: . sodium chloride 50 mL/hr at 09/30/13 2358  . feeding supplement (OSMOLITE 1.5 CAL) 1,000 mL (09/30/13 1045)   PRN Meds:.acetaminophen, HYDROmorphone (DILAUDID) injection, lidocaine-prilocaine, LORazepam, metoprolol, ondansetron (ZOFRAN) IV, ondansetron (ZOFRAN) IV, ondansetron, ondansetron, oxyCODONE, promethazine  Objective:  Filed Vitals:   10/01/13 0510  BP: 155/102  Pulse: 104  Temp: 98 F (36.7 C)  Resp: 20    Body mass index is 32.56  kg/(m^2).  Intake/Output Summary (Last 24 hours) at 10/01/13 0726 Last data filed at 10/01/13 0600  Gross per 24 hour  Intake 2311.67 ml  Output   2300 ml  Net  11.67 ml    GENERAL: alert, no distress. Conversant  SKIN: skin color, texture, turgor are normal, no rashes or significant lesions EYES: normal, conjunctiva are pink and non-injected, sclera clear OROPHARYNX: no exudate, no erythema and lips, buccal mucosa, and tongue normal. No mucositis NECK: supple, thyroid normal size, non-tender, without nodularity LYMPH:  Persistent palpable lymphadenopathy in the neck, improved compared to baseline. LUNGS: clear to auscultation and percussion with normal breathing effort. Left port normal HEART: regular rate & rhythm and no murmurs with trace bilateral lower extremity edema. ABDOMEN: abdomen soft, non-tender and normal bowel sounds. Feeding tube normal.  Musculoskeletal: no cyanosis of digits and no clubbing  PSYCH: alert & oriented x 3 with fluent speech NEURO: no focal motor/sensory deficits    CBG (last 3)  No results found for this basename: GLUCAP,  in the last 72 hours   Labs:   Recent Labs Lab 09/26/13 0510 09/30/13 0522  WBC 3.9* 2.1*  HGB 12.6* 12.8*  HCT 37.7* 38.4*  PLT 103* 143*  MCV 87.7 87.5  MCH 29.3 29.2  MCHC 33.4 33.3  RDW 13.5 13.6  LYMPHSABS 0.5* 0.3*  MONOABS 0.2 0.2  EOSABS 0.1 0.0  BASOSABS 0.0 0.0     Chemistries:    Recent  Labs Lab 09/27/13 0535 09/30/13 0522  NA 133* 132*  K 4.4 4.9  CL 97 93*  CO2 28 26  GLUCOSE 125* 124*  BUN 22 24*  CREATININE 1.23 1.27  CALCIUM 8.8 9.4  AST 20 24  ALT 42 43  ALKPHOS 82 101  BILITOT 0.3 0.3     Liver Function Tests:  Recent Labs Lab 09/27/13 0535 09/30/13 0522  AST 20 24  ALT 42 43  ALKPHOS 82 101  BILITOT 0.3 0.3  PROT 6.5 7.5  ALBUMIN 3.1* 3.4*   CBG:  Recent Labs Lab 09/24/13 0747 09/26/13 0756  GLUCAP 124* 94      Assessment/Plan:  #1 locally advanced  tonsil cancer  s/p right tonsillar biopsy 07/09/2013 for a P16 positive squamous cell carcinoma, 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 He can continue radiation therapy while hospitalized. On 6/1 had to be placed on hold due to vomiting. Reinforced the importance of radiation to the patient. As symptoms began to improve, radiation was reinitiated on 6/4. To continue today, with lorazepam half an hour before his planned radiation for nausea and anxiety control. Appreciate Dr. Pearlie Oyster (Radiation Oncology) follow up.   #2 uncontrolled nausea and vomiting with gagging  Patient reported that his mucus buildup caused these symptoms He was placed on  IV fluids and IV anti-emetics around the lock with promethazine IV Q6, zofran IV Q8 prn as this has worked well in prior admission.  Mucinex was increased on 6/1 to decrease gagging.  Scopolamine was tried on 6/2, but discontinued due to dizziness and dry mouth as side effects.  Daily dexamethasone was added for to help with nausea with adequate results. Last dose on 6/6, plan to hold further doses Patient complained that feeding tube is contributing to nausea. His tube feeding was placed on hold on 6/3. On 6/4 it continued at a lower rate, initially at 30 ml, now at 50 ml per hour, initiated on 6/8.  CT scan of the head without contrast was negative for brain metastasis. However, it was positive for scattered sinus disease and mucosal thickening versus nasal polyps. Scheduled promethazine restarted and later discontinued. On 6/10 Phenergan was changed to as needed instead of scheduled dose. Scheduled IV antiemetics are being weaned off in anticipation of possible discharge in the near future.  #3 severe dehydration with recent renal failure  Secondary to severe vomiting and dehydration Placed on IV fluid resuscitation, at 150 ml/hour. Renal failure worsened as  of 6/3 with BUN and Creatinine increasing. May be related to steroid initiation.  As of 6/5, there is a slight improvement of his BUN and Creatinine.  As of 6/7, his IV fluids were reduced to 50 cc/hour, as his renal functions continue to improve. Follow present therapy. His renal functions are now normal. Plan to continue on current IV fluids rate until discharge date.  #4 weight loss with malnutrition  Nutrition service consultation appreciated. PEG tube feeding had been increased to goal 90 ml/hr, then placed on hold. Patient complained that feeding tube was contributing to nausea. Total parenteral nutrition was considered as he continued to have nausea and vomiting. His rate was decreased to 30 ml/hr with better tolerance, now at its goal rate of 50 ml/hr as of 6/8. Patient is trying to increase oral intake as tolerated. Will continue to monitor.  #5 DVT prophylaxis  On Lovenox. Of note, patient refused 5/30 evening  dose. Importance of Lovenox injection to reduce risk of thrombosis was discussed. He agreed to continue Lovenox injections daily.  #6 Hypoglycemia, resolved Due to decreased oral intake. Stable today Monitor closely  #7 Fever Likely reactive to inflammation and pain.  Resolved with antipyretics.  #8 Bilateral lower extremity swelling This is acute. May have been  exacerbated by IV fluids, malnutrition and decreased ambulation. Low suspicion for clot as patient is on Lovenox anticoagulation.  Improving. Continue to elevate legs and ambulate as tolerated.   IV fluids were decreased once renal function improved with improvement of this edema. serum albumin was therapeutically increasing since admission at 3.2 (on 6/8), currently at 3.4.   #9 CODE STATUS  Full code   #10 elevated liver function test  Likely related to recent treatment.  Resolved based on latest labs  #11 Mild pancytopenia This is likely related to recent therapy. The patient denies recent history of  bleeding such as epistaxis, hematuria or hematochezia. He is asymptomatic from the pancytopenia. We will observe for now. He does not require transfusion now.   #12 Constipation Due to decreased mobility, dehydration, pain meds and poor oral intake. Last bowel movement on 6/7 Miralax added on 6/9 but patient refused. Importance of bowel evacuation was explained, patient  Initially agreed to take med and then refused again, including during this visit.  He wants to avoid enema as if possible. On 6/16 he received  lactulose, MiraLAX and Senokot. He had a bowel movement without difficulties.  Constipation resolved but planned to continue on laxatives to prevent severe constipation especially with additional pain medications  #13 Hypertension:  This was not well controlled with hydralazine and metroprolol for which amlodipine was added to the regimen on 6/5 at 10 mg daily, with blood pressure goal at 140/90. Blood pressure still elevated. Will monitor for now, may need increase of dose again. This is likely due to fluid retention.  Despite discontinuation of dexamethasone and reduction in IV fluids rate, no improvement was seen Metoprolol was added on 6/11, still elevated.  Dose was increased on 6/15 at 50 mg bid. To date, this continues to be elevated.  Will monitor closely, could be due to element of uncontrolled pain  #14: Hypokalemia In the setting of vomiting and diuretics for edema and hypertension. He received Kdur 20 meq on 6/7 with resolution of electrolyte imbalance.  #15 intermittent chest wall pain This is due to coughing. Recommend he try oral pain medicine first if possible in an attempt to wean him off intravenous medicines. Fentanyl patch was added on 6/15, immediate release oxycodone was increased to 15 mg.  Patient was encouraged try oral medicine first before IV medicine.  #16 OT/PT If patient remains hospitalized, OT/PT intervention would be of use, as patient needs to  increase mobility with assistance.   #17 discharge planning Plans for discharge if nausea, vomiting are better controlled and tube feeding is tolerated at a goal rate.  In addition, would be ideal to have patient discharged with blood pressure achieving its goal at 140/90 and the ability to tolerate oral antiemetics and IV pain medicine..    **Disclaimer: This note was dictated with voice recognition software. Similar sounding words can inadvertently be transcribed and this note may contain transcription errors which may not have been corrected upon publication of note.Sharene Butters E, PA-C 10/01/2013  7:26 AM GORSUCH, NI, MD 10/01/2013

## 2013-10-02 ENCOUNTER — Ambulatory Visit
Admission: RE | Admit: 2013-10-02 | Discharge: 2013-10-02 | Disposition: A | Discharge status: Home / Self Care | Payer: Medicaid Other | Source: Ambulatory Visit | Attending: Radiation Oncology | Admitting: Radiation Oncology

## 2013-10-02 MED ORDER — OSMOLITE 1.5 CAL PO LIQD
1000.0000 mL | ORAL | Status: DC
  Administered 2013-10-03 – 2013-10-04 (×3): 1000 mL
  Filled 2013-10-02 (×4): qty 1000

## 2013-10-02 NOTE — Progress Notes (Signed)
Mason [name]   DOB:February 13, 1966   YI#:[id number]   JJK#:[id number]  I have seen the patient, examined him and edited the notes as follows   Subjective:  Afebrile this morning. Nausea better controlled. Dry heaves are worse when secretions build up,resolved when spitting. Today, patient reports increased saliva which exacerbates al this symptoms. Intermittently, he expectorates blood tinged sputum due to forceful emesis. He admits to have strong gag reflex prior to hospitalization. He is trying to increase his oral intake, with small bites at a time, but have not done so on the last 2 days, afraid nausea would worsen. Tolerating tube feeding at 60 ml/hr.  Denies any respiratory or cardiac complaints. Denies lower extremity swelling.  Last bowel movement on 6/16  without blood in the stools with laxative support. No confusion. He received radiation on 6/4, to continue as planned.  Has not ambulated over the last 4 days outside of the room.  Scheduled Meds: . amLODipine  10 mg Oral Daily  . chlorhexidine  15 mL Mouth/Throat QID  . docusate sodium  200 mg Oral BID  . enoxaparin (LOVENOX) injection  40 mg Subcutaneous Q24H  . feeding supplement (PRO-STAT SUGAR FREE 64)  60 mL Per Tube BID BM  . fentaNYL  25 mcg Transdermal Q72H  . guaiFENesin  1,200 mg Oral BID  . hydrALAZINE  50 mg Oral 3 times per day  . lactulose  30 g Oral TID  . loratadine  10 mg Oral Daily  . magic mouthwash w/lidocaine  10 mL Oral QID  . metoprolol tartrate  50 mg Oral BID  . polyethylene glycol  17 g Oral Daily  . senna-docusate  2 tablet Oral BID   Continuous Infusions: . sodium chloride 1,000 mL (10/01/13 1754)  . feeding supplement (OSMOLITE 1.5 CAL) 1,000 mL (10/02/13 0705)   PRN Meds:.acetaminophen, HYDROmorphone (DILAUDID) injection, lidocaine-prilocaine, LORazepam, metoprolol, ondansetron (ZOFRAN) IV, ondansetron (ZOFRAN) IV, ondansetron, ondansetron, oxyCODONE, promethazine  Objective:  Filed Vitals:    10/02/13 0547  BP: 141/94  Pulse: 124  Temp: 98.8 F (37.1 C)  Resp: 16    Body mass index is 32.56 kg/(m^2).  Intake/Output Summary (Last 24 hours) at 10/02/13 0735 Last data filed at 10/02/13 0600  Gross per 24 hour  Intake   2320 ml  Output    950 ml  Net   1370 ml    GENERAL: alert, no distress but in moderate discomfort due to increased mucus and secretions that interfere with his attempt to talk.  SKIN: skin color, texture, turgor are normal, no rashes or significant lesions EYES: normal, conjunctiva are pink and non-injected, sclera clear OROPHARYNX: no exudate, no erythema and lips, buccal mucosa, and tongue normal. No mucositis NECK: supple, thyroid normal size, non-tender, without nodularity LYMPH:  Persistent palpable lymphadenopathy in the neck, improved compared to baseline. LUNGS: clear to auscultation and percussion with normal breathing effort. Left port normal HEART: regular rate & rhythm and no murmurs with trace bilateral lower extremity edema. ABDOMEN: abdomen soft, non-tender and normal bowel sounds. Feeding tube normal.  Musculoskeletal: no cyanosis of digits and no clubbing  PSYCH: alert & oriented x 3 with fluent speech NEURO: no focal motor/sensory deficits    CBG (last 3)  No results found for this basename: GLUCAP,  in the last 72 hours   Labs:   Recent Labs Lab 09/26/13 0510 09/30/13 0522  WBC 3.9* 2.1*  HGB 12.6* 12.8*  HCT 37.7* 38.4*  PLT 103* 143*  MCV 87.7 87.5  MCH 29.3 29.2  MCHC 33.4 33.3  RDW 13.5 13.6  LYMPHSABS 0.5* 0.3*  MONOABS 0.2 0.2  EOSABS 0.1 0.0  BASOSABS 0.0 0.0     Chemistries:    Recent Labs Lab 09/27/13 0535 09/30/13 0522  NA 133* 132*  K 4.4 4.9  CL 97 93*  CO2 28 26  GLUCOSE 125* 124*  BUN 22 24*  CREATININE 1.23 1.27  CALCIUM 8.8 9.4  AST 20 24  ALT 42 43  ALKPHOS 82 101  BILITOT 0.3 0.3     Liver Function Tests:  Recent Labs Lab 09/27/13 0535 09/30/13 0522  AST 20 24  ALT 42 43   ALKPHOS 82 101  BILITOT 0.3 0.3  PROT 6.5 7.5  ALBUMIN 3.1* 3.4*   CBG:  Recent Labs Lab 09/26/13 0756  GLUCAP 94      Assessment/Plan:  #1 locally advanced tonsil cancer  s/p right tonsillar biopsy 07/09/2013 for a P16 positive squamous cell carcinoma, 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 He can continue radiation therapy while hospitalized. On 6/1 had to be placed on hold due to vomiting. Reinforced the importance of radiation to the patient. As symptoms began to improve, radiation was reinitiated on 6/4. To continue today, with lorazepam half an hour before his planned radiation for nausea and anxiety control. Appreciate Dr. Pearlie Oyster (Radiation Oncology) follow up.   #2 uncontrolled nausea and vomiting with gagging  Patient reported that his mucus buildup caused these symptoms He was placed on  IV fluids and IV anti-emetics around the lock with promethazine IV Q6, zofran IV Q8 prn as this has worked well in prior admission.  Mucinex was increased on 6/1 to decrease gagging.  Scopolamine was tried on 6/2, but discontinued due to dizziness and dry mouth as side effects.  Daily dexamethasone was added for to help with nausea with adequate results. Last dose on 6/6, plan to hold further doses Patient complained that feeding tube is contributing to nausea. His tube feeding was placed on hold on 6/3. On 6/4 it continued at a lower rate, initially at 30 ml, now at 50 ml per hour, initiated on 6/8.  CT scan of the head without contrast was negative for brain metastasis. However, it was positive for scattered sinus disease and mucosal thickening versus nasal polyps. Scheduled promethazine restarted and later discontinued. On 6/10 Phenergan was changed to as needed instead of scheduled dose. Scheduled IV antiemetics are being weaned off in anticipation of possible discharge in the near  future. He is willing to try scopolamine again as secretions increased.   #3 severe dehydration with recent renal failure  Secondary to severe vomiting and dehydration Placed on IV fluid resuscitation, at 150 ml/hour. Renal failure worsened as of 6/3 with BUN and Creatinine increasing. May be related to steroid initiation.  As of 6/5, there is a slight improvement of his BUN and Creatinine.  As of 6/7, his IV fluids were reduced to 50 cc/hour, as his renal functions continue to improve. Follow present therapy. His renal functions are now normal. Plan to continue on current IV fluids rate until discharge date.  #4 weight loss with malnutrition  Nutrition service consultation appreciated. PEG tube feeding had been increased to goal 90 ml/hr, then placed on hold. Patient complained that feeding tube was contributing to nausea. Total parenteral nutrition was considered as he continued to have nausea and vomiting. His rate was  decreased to 30 ml/hr with better tolerance, achieving goal rate of 50 ml/hr as of 6/8. Patient is trying to increase oral intake as tolerated but due to nausea and secretions he is unable to do so. Goal rate of the tube feeding has been increased to 60 ml/h, tolerating without complications Will continue to monitor.  #5 DVT prophylaxis  On Lovenox. Of note, patient refused 5/30 evening dose. Importance of Lovenox injection to reduce risk of thrombosis was discussed. He agreed to continue Lovenox injections daily.  #6 Hypoglycemia, resolved Due to decreased oral intake. Stable today Monitor closely  #7 Fever Likely reactive to inflammation and pain.  Resolved with antipyretics.  #8 Bilateral lower extremity swelling This is acute. May have been  exacerbated by IV fluids, malnutrition and decreased ambulation. Low suspicion for clot as patient is on Lovenox anticoagulation.  Improving. Continue to elevate legs and ambulate as tolerated.   IV fluids were decreased  once renal function improved with improvement of this edema. serum albumin was therapeutically increasing since admission at 3.2 (on 6/8), currently at 3.4.  As of 6/17, no swelling noted.   #9 CODE STATUS  Full code   #10 elevated liver function test  Likely related to recent treatment.  Resolved based on latest labs  #11 Mild pancytopenia This is likely related to recent therapy. The patient denies recent history of bleeding such as epistaxis, hematuria or hematochezia. He is asymptomatic from the pancytopenia. We will observe for now. He does not require transfusion now.   #12 Constipation Due to decreased mobility, dehydration, pain meds and poor oral intake. Last bowel movement on 6/7 Miralax added on 6/9 but patient refused. Importance of bowel evacuation was explained, patient  Initially agreed to take med and then refused again, including during this visit.  He wants to avoid enema as if possible. On 6/16 he received  lactulose, MiraLAX and Senokot. He had a bowel movement without difficulties.  Constipation resolved but planned to continue on laxatives to prevent severe constipation especially with additional pain medications  #13 Hypertension:  This was not well controlled with hydralazine and metroprolol for which amlodipine was added to the regimen on 6/5 at 10 mg daily, with blood pressure goal at 140/90. Blood pressure still elevated. Will monitor for now, may need increase of dose again. This is likely due to fluid retention.  Despite discontinuation of dexamethasone and reduction in IV fluids rate, no improvement was seen Metoprolol was added on 6/11, still elevated.  Dose was increased on 6/15 at 50 mg bid. Could be due to element of uncontrolled pain Today, his blood pressure reaching its goal. Will monitor.   #14: Hypokalemia In the setting of vomiting and diuretics for edema and hypertension. He received Kdur 20 meq on 6/7 with resolution of electrolyte  imbalance.  #15 intermittent chest wall pain This is due to coughing. Recommend he try oral pain medicine first if possible in an attempt to wean him off intravenous medicines. Fentanyl patch was added on 6/15, immediate release oxycodone was increased to 15 mg.  Patient was encouraged try oral medicine first before IV medicine.  #16 OT/PT OT/PT intervention would be of use, as patient needs to increase mobility with assistance. Has not ambulated outside room for 4 days.   #17 discharge planning Plans for discharge if nausea, vomiting are better controlled and tube feeding is tolerated at a goal rate. Blood pressure reaching goal of 140/90 In addition, would be ideal to have patient discharged  the ability to tolerate oral antiemetics and IV pain medicine. Aim for discharge this Friday  **Disclaimer: This note was dictated with voice recognition software. Similar sounding words can inadvertently be transcribed and this note may contain transcription errors which may not have been corrected upon publication of note.Sharene Butters E, PA-C 10/02/2013  7:35 AM GORSUCH, NI, MD 10/02/2013

## 2013-10-02 NOTE — Progress Notes (Signed)
NUTRITION FOLLOW UP  Intervention: -Recommend to advance Osmolite 1.5 by 5 ml every 4 hours to new goal rate of 60 ml/hr and Pro-Stat 60 ml BID to meet minimum kcal needs as pt with negibile PO intake -Osmolite 1.5 at new goal rate of 60 ml/hr with Pro-Stat 60 ml BID will provide 2560 kcal (98% est kcal needs), 150 gram protein (100% est protein needs)  Nutrition Dx:   Inadequate oral intake related to persistent nausea/vomiting as evidenced by pt report of frequent emesis after any intake-ongoing   Goal:   Pt to meet >/=90% of estimated nutrition needs -progressing with TF   Monitor:   TF tolerance, weight trend, labs, GI profile, total protein/energy intake   Assessment:   5/28: - Pt had PEG placed on 08/15/13  - Pt received cycle 2 chemotherapy on 09/10/2013  - Pt followed by [facility] has lost 30 lbs x 1 month (9.1%), which is severe for time frame  - Pt reports poor PO intake x 1 month; dietary intake includes things like pudding, grits, jello, Ensure  - Pt reports using his PEG tube for the first time last night and he put 1 Ensure Plus and water in the tube. Pt reports that he vomited after using the tube  - Pt still very nauseous at time of assessment  - Recommend using PEG for sole source of nutrition until pt able to meet >/=50% of minimum estimated needs by PO intake  No muscle or subcutaneous fat depletion noticed.  K, Mg WNL  5/29: -Pt has refused initiation of TF d/t ongoing nausea and vomiting episodes. RN reported pt refused last night and this morning as he had 3 episodes emesis this AM. -Per discussion with RN, pt has only tolerated sips of gingerale -MD noted pt with mucous and gagging that exacerbates GI symptoms. Receiving Mucinex and phenergan. Possible TPN candidate if continues with intractable n/v and intolerance of EN. -Modified TF to start at lower trickle feed of 10 ml/hr to possibly assist in improving tolerance. Recommend  Phos/Mg/K be monitored once pt able to tolerate initiation of TF  6/01: -Vital AF 1.2 currently running at 50 ml/hr, providing 1440 kcal (55% est kcal needs), and 90 gram protein (64% est protein needs). Pt tolerating w/out residuals. Pt refused further TF advancement on 5/31 -IVF running at 150 ml/hr -MD noted pt continues with nausea but no vomiting. Exhibits mucous/phlem/gagging that makes PO intake difficult/nonexistent.  PO intake 0% -Pt agreeable to trial advancement of TF to 60 ml/hr during time of RD follow. Declined any PO beverages. Informed RN to advance TF and assess tolerance.  -Recommend to continue to advance to Vital AF 1.2 to goal of 90 ml/hr as tolerated -K low -Renal function improving with hydration  6/03: -Pt vomited after Vital AF 1.2 was advanced to 70 ml/hr. TF was decreased to 50 ml/hr -Per RN, pt refused to restart TF this morning d/t nausea. MD also noted pt refusing radiation treatments since 6/01 -Discussed TF regimen with pt. He noted some improvement in nausea and is w/out vomiting. 0% PO intake d/t nausea/dry heaves -Pt may benefit from lower volume of more nutrient dense TF -Pt in agreement to modify to higher calorie/protein formula Vital 1.5, with the addition of protein supplement Pro-Stat. -Vital 1.5 at goal rate of 50 ml/hr with Pro-stat QID will provide 85% est kcal needs, and 100% est protein needs.  -Will monitor TF tolerance and modify protein supplements  as needed  6/04: -Vital 1.5 currently running at 30 ml/hr. This provides 1080 kcal (42% est kcal needs), and 49 gram protein (35% est protein needs). Received one Pro-Stat yesterday and tolerated w/out nausea -Pt reported much improvement in nausea with TF adjustments. Was willing to continue with increase to goal rate of 50 ml/hr after radiation treatment today at 3pm -Discussed TF with RN. Encouraged to administer Pro-Stat via PEG to minimize nausea -0% PO intake -MD noted pt's elevated BUN/Crt  may be related to steroids. Continuing with IVF at 150 ml/hr. -Will monitor TF advancement and increase Pro-Stat as warranted  6/05: -Vital 1.5 currently running at 40 ml/hr. This provides 1440 kcal (55% est kcal needs), 65 gram protein (46% est protein needs). Pt tolerating two Pro-Stat daily -Refused TF advancement on 6/05 -Pt noted he was going to trial advancement later today. Verbalized understanding in importance of receiving Pro-Stat QID to assist in meeting protein/kcal needs.  -Discussed w/RN. Was in agreement to encourage Pro-Stat and TF advancement as tolerated   6/08: -Pt tolerating Vital 1.5 at 50 ml/hr. Denied nausea/vomiting -Reports tolerance of Pro-Stat QID, however per discussion with RN, pt has been refusing Pro-Stat and likely consuming only 1-2 supplement daily.  -Vital 1.5 at 50 ml/hr with Pro-Stat once daily is providing 1900 kcal (73% est kcal needs), and 96 gram protein (69% est protein needs) -Discussed alternatives to increase pt's compliance of protein supplement with RN and Pharmacy.  -Will trial Pro-stat 60 ml BID and modify as warranted -One episode vomiting on 6/07-RN noted mostly salvia and is tolerating TF w/12ml residuals  6/09: -Vital 1.5 at 50 ml/hr, tolerating with 0 ml residuals. Having some vomiting of mucous. Receiving magic mouthwash -Received Pro-Stat 60 ml BID, will continue this regimen vs 30 ml QID to encourage supplement compliance -Pt to undergo radiation today -W/out BM for 3 days, daily miralax started -IVF at 50 ml/hr for renal failure, BUN/Crt elevated but improving  6/10: -Pt reports consuming and tolerating ProStat 60 ml BID; however per discussion with RN, pt refusing. -Tolerating TF, pt noted some mucous that is relieved by Mucinex.  -Discussed pt with outpatient RD concerning pt's d/c nutrition needs. Will trial Osmolite 1.5 as this would be more affordable/accessible than the specialty formula Vital 1.5. If pt unable to tolerate,  will need to be d/c on Vital 1.5. - Outpatient RD noted that [facility] will likely assist pt with providing TF -[facility] unable to provide pt with TF at this time.  6/11: -Pt tolerating Osmolite 1.5 at 50 ml/hr w/out residuals and minimal nausea/muscous. Recommend to continue Osmolite 1.5 formula w/ProStat 60 ml BID on D/C. -Has refused both doses of Pro-Stat yesterday per nursing staff; however continues to report to RD that he consistently takes them. RD continues to affirm and educate pt on importance of supplements on a daily basis -RD did confirm with RN that pt did tolerate one 60 ml dose of Pro-Stat this morning 6/11. RN stated that pt was able to verbalize the importance of taking supplement. Encourage Nurse Tech and RN to continue to relay importance of Pro-Stat to meet >75% estimated nutrition needs  6/16: -Pt w/out BM since 6/7. Was started on Miralax, Senekot, and lactulose. Had BM on 6/15. -Consider addition of Reglan to assist in regular BMs, gastric motility, and improvement in nausea -Tolerating Osmolite 1.5 at 50 ml/hr. Pt taking Pro-Stat 60 ml via PEG. -RN noted pt continues with nausea and 0% PO intake.  -Weight trending down,  likely d/t continued nausea/gagging, intolerance of TF and minimal PO intake -As tolerated, increase Osmolite 1.5 by 5 ml every 4 hour to new goal rate of 60 ml/hf with Pro-Stat 60 ml BID to meet > 90% estimated needs.   6/17: -Per RN, pt tolerating Osmolite 1.5 at 50 ml/hr and ProStat 60 ml BID -Having regular BMs with current stool regimen, MD noted plan to continue pt on lactulose, Miralax and Senokot as pt at high risk for constipation d/t decreased mobility, and additional pain medications -Will recommend to advance to new goal rate conservatively as pt continues with negibile nutrition from PO intake. Osmolite 1.5 at 60 ml/hr with Pro-Stat 60 ml BID will provide 100% est nutrition needs -Pt resting during time of assessment  Height: Ht  Readings from Last 1 Encounters:  09/12/13 6\' 7"  (2.007 m)    Weight Status:   Wt Readings from Last 1 Encounters:  10/01/13 289 lb 1.6 oz (131.135 kg)  09/12/13 298 lbs  Re-estimated needs:  Kcal: 2600 - 2800  Protein: 140 - 160 grams  Fluid: 2.8 L fluid    Skin: chest incision, abdomen incision    Diet Order: Full Liquid   Intake/Output Summary (Last 24 hours) at 10/02/13 1224 Last data filed at 10/02/13 0742  Gross per 24 hour  Intake   2320 ml  Output   1275 ml  Net   1045 ml    Last BM: 6/16   Labs:   Recent Labs Lab 09/27/13 0535 09/30/13 0522  NA 133* 132*  K 4.4 4.9  CL 97 93*  CO2 28 26  BUN 22 24*  CREATININE 1.23 1.27  CALCIUM 8.8 9.4  GLUCOSE 125* 124*    CBG (last 3)  No results found for this basename: GLUCAP,  in the last 72 hours  Scheduled Meds: . amLODipine  10 mg Oral Daily  . chlorhexidine  15 mL Mouth/Throat QID  . docusate sodium  200 mg Oral BID  . enoxaparin (LOVENOX) injection  40 mg Subcutaneous Q24H  . feeding supplement (PRO-STAT SUGAR FREE 64)  60 mL Per Tube BID BM  . fentaNYL  25 mcg Transdermal Q72H  . guaiFENesin  1,200 mg Oral BID  . hydrALAZINE  50 mg Oral 3 times per day  . lactulose  30 g Oral TID  . loratadine  10 mg Oral Daily  . magic mouthwash w/lidocaine  10 mL Oral QID  . metoprolol tartrate  50 mg Oral BID  . polyethylene glycol  17 g Oral Daily  . senna-docusate  2 tablet Oral BID    Continuous Infusions: . sodium chloride 1,000 mL (10/02/13 1129)  . feeding supplement (OSMOLITE 1.5 CAL)      Atlee Abide MS RD LDN Clinical Dietitian [phone]

## 2013-10-02 NOTE — Progress Notes (Signed)
[facility] Therapy Treatment Record Phone [phone]   Radiation Therapy was administered to [name] on: 10/02/2013  3:10 PM and was treatment # 27 out of a planned course of 35 treatments.

## 2013-10-02 NOTE — Progress Notes (Signed)
Ptient refused to increase TF rate,he said hefelt full and passing a lot of gas.will continue to monitor the patient.- Sandie Ano RN

## 2013-10-03 ENCOUNTER — Other Ambulatory Visit: Payer: Self-pay | Admitting: Hematology and Oncology

## 2013-10-03 ENCOUNTER — Ambulatory Visit: Payer: Medicaid Other

## 2013-10-03 MED ORDER — FENTANYL 50 MCG/HR TD PT72
50.0000 ug | MEDICATED_PATCH | TRANSDERMAL | Status: DC
  Administered 2013-10-03: 50 ug via TRANSDERMAL
  Filled 2013-10-03: qty 1

## 2013-10-03 NOTE — Progress Notes (Signed)
Mason [name]   DOB:[birth year]   ER#:[id number]   PPJ#:[id number]   I have seen the patient, examined him and edited the notes as follows  Subjective:  Afebrile this morning.No new events overnight.  Nausea better controlled with current regimen. Dry heaves are worse when secretions build up,resolved when spitting.Increased saliva which exacerbates al this symptoms. Intermittently, he expectorates blood tinged sputum due to forceful emesis. He admits to have strong gag reflex prior to hospitalization. He is trying to increase his oral intake, with small bites at a time but feels that nausea worsens when doing so. Tolerating tube feeding at 60 ml/hr except for increased flatulence.  Denies any respiratory or cardiac complaints. Denies lower extremity swelling.  Last bowel movement on 6/17  without blood in the stools with laxative support. No confusion. He received radiation on 6/4, to continue as planned. To date he received 85 of planned 35 treatments.  Has not ambulated over the last 5 days outside of the room. His pain control has improved with addition of fentanyl patch  Scheduled Meds: . amLODipine  10 mg Oral Daily  . chlorhexidine  15 mL Mouth/Throat QID  . docusate sodium  200 mg Oral BID  . enoxaparin (LOVENOX) injection  40 mg Subcutaneous Q24H  . feeding supplement (PRO-STAT SUGAR FREE 64)  60 mL Per Tube BID BM  . fentaNYL  25 mcg Transdermal Q72H  . guaiFENesin  1,200 mg Oral BID  . hydrALAZINE  50 mg Oral 3 times per day  . lactulose  30 g Oral TID  . loratadine  10 mg Oral Daily  . magic mouthwash w/lidocaine  10 mL Oral QID  . metoprolol tartrate  50 mg Oral BID  . polyethylene glycol  17 g Oral Daily  . senna-docusate  2 tablet Oral BID   Continuous Infusions: . sodium chloride 50 mL/hr at 10/03/13 0230  . feeding supplement (OSMOLITE 1.5 CAL) 1,000 mL (10/03/13 0223)   PRN Meds:.acetaminophen, HYDROmorphone (DILAUDID) injection, lidocaine-prilocaine, LORazepam,  metoprolol, ondansetron (ZOFRAN) IV, ondansetron (ZOFRAN) IV, ondansetron, ondansetron, oxyCODONE, promethazine  Objective:  Filed Vitals:   10/03/13 0442  BP: 147/90  Pulse: 105  Temp: 98.7 F (37.1 C)  Resp: 20    Body mass index is 32.03 kg/(m^2).  Intake/Output Summary (Last 24 hours) at 10/03/13 0733 Last data filed at 10/03/13 0711  Gross per 24 hour  Intake 2557.1 ml  Output   1300 ml  Net 1257.1 ml    GENERAL: alert, no distress but in moderate discomfort due to increased mucus and secretions that interfere with his attempt to talk.  SKIN: skin color, texture, turgor are normal, no rashes or significant lesions EYES: normal, conjunctiva are pink and non-injected, sclera clear OROPHARYNX: no exudate, no erythema and lips, buccal mucosa, and tongue normal. No mucositis NECK: supple, thyroid normal size, non-tender, without nodularity LYMPH:  Persistent palpable lymphadenopathy in the neck, improved compared to baseline. LUNGS: clear to auscultation and percussion with normal breathing effort. Left port normal HEART: regular rate & rhythm and no murmurs with trace bilateral lower extremity edema. ABDOMEN: abdomen soft, non-tender and normal bowel sounds. Feeding tube normal.  Musculoskeletal: no cyanosis of digits and no clubbing  PSYCH: alert & oriented x 3 with fluent speech NEURO: no focal motor/sensory deficits    CBG (last 3)  No results found for this basename: GLUCAP,  in the last 72 hours   Labs:   Recent Labs Lab 09/30/13 0522  WBC 2.1*  HGB 12.8*  HCT 38.4*  PLT 143*  MCV 87.5  MCH 29.2  MCHC 33.3  RDW 13.6  LYMPHSABS 0.3*  MONOABS 0.2  EOSABS 0.0  BASOSABS 0.0     Chemistries:    Recent Labs Lab 09/27/13 0535 09/30/13 0522  NA 133* 132*  K 4.4 4.9  CL 97 93*  CO2 28 26  GLUCOSE 125* 124*  BUN 22 24*  CREATININE 1.23 1.27  CALCIUM 8.8 9.4  AST 20 24  ALT 42 43  ALKPHOS 82 101  BILITOT 0.3 0.3     Liver Function  Tests:  Recent Labs Lab 09/27/13 0535 09/30/13 0522  AST 20 24  ALT 42 43  ALKPHOS 82 101  BILITOT 0.3 0.3  PROT 6.5 7.5  ALBUMIN 3.1* 3.4*   CBG:  Recent Labs Lab 09/26/13 0756  GLUCAP 94      Assessment/Plan:  #1 locally advanced tonsil cancer  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 He continued radiation therapy while hospitalized. On 6/1 this had to be placed on hold due to vomiting. Reinforced the importance of radiation to the patient. As symptoms began to improve, radiation was reinitiated on 6/4. Symptoms are stable with lorazepam half an hour before his planned radiation for nausea and anxiety control. Appreciate Dr. Pearlie Oyster (Radiation Oncology) follow up.   #2 uncontrolled nausea and vomiting with gagging  Patient reported that his mucus buildup caused these symptoms He was placed on  IV fluids and IV anti-emetics around the lock with promethazine IV Q6, zofran IV Q8 prn as this has worked well in prior admission.  Mucinex was increased on 6/1 to decrease gagging.  Scopolamine was tried on 6/2, but discontinued due to dizziness and dry mouth as side effects.  Daily dexamethasone was added for to help with nausea with adequate results. Last dose on 6/6, plan to hold further doses Patient complained that feeding tube is contributing to nausea. His tube feeding was placed on hold on 6/3. On 6/4 it continued at a lower rate, initially at 30 ml, now at 50 ml per hour, initiated on 6/8.  CT scan of the head without contrast was negative for brain metastasis. However, it was positive for scattered sinus disease and mucosal thickening versus nasal polyps. Scheduled promethazine restarted and later discontinued. On 6/10 Phenergan was changed to as needed instead of scheduled dose. Scheduled IV  antiemetics are being weaned off in anticipation of possible discharge in the near future.  #3 severe dehydration with recent renal failure  Secondary to severe vomiting and dehydration Placed on IV fluid resuscitation, at 150 ml/hour. Renal failure worsened as of 6/3 with BUN and Creatinine increasing. May be related to steroid initiation.  As of 6/5, there is a slight improvement of his BUN and Creatinine.  As of 6/7, his IV fluids were reduced to 50 cc/hour, as his renal functions continue to improve. Follow present therapy. His renal functions are now normal. Plan to continue on current IV fluids rate until discharge date.  #4 weight loss with malnutrition  Nutrition service consultation appreciated. PEG tube feeding had been increased to goal 90 ml/hr, then placed on hold. Patient complained that feeding tube was contributing to nausea. Total parenteral nutrition was considered as he continued to have nausea and vomiting. His rate was decreased to 30 ml/hr with better tolerance, achieving goal  rate of 50 ml/hr as of 6/8. Patient is trying to increase oral intake as tolerated but due to nausea and secretions he is unable to do so. Goal rate of the tube feeding has been increased to 60 ml/h, tolerating without complications, except for some flatulence during feedings May add simethicone to help with the symptoms Will continue to monitor.  #5 DVT prophylaxis  On Lovenox. Of note, patient refused 5/30 evening dose. Importance of Lovenox injection to reduce risk of thrombosis was discussed. He agreed to continue Lovenox injections daily.  #6 Hypoglycemia, resolved Due to decreased oral intake. Stable today Monitor closely  #7 Fever Likely reactive to inflammation and pain.  Resolved with antipyretics.  #8 Bilateral lower extremity swelling This is acute. May have been  exacerbated by IV fluids, malnutrition and decreased ambulation. Low suspicion for clot as patient is on Lovenox  anticoagulation.  Improving. Continue to elevate legs and ambulate as tolerated.   IV fluids were decreased once renal function improved with improvement of this edema. serum albumin was therapeutically increasing since admission at 3.2 (on 6/8), currently at 3.4.  As of 6/17, no swelling noted.   #9 CODE STATUS  Full code   #10 elevated liver function test  Likely related to recent treatment.  Resolved based on latest labs  #11 Mild pancytopenia This is likely related to recent therapy. The patient denies recent history of bleeding such as epistaxis, hematuria or hematochezia. He is asymptomatic from the pancytopenia. We will observe for now. He does not require transfusion now.   #12 Constipation Due to decreased mobility, dehydration, pain meds and poor oral intake. Last bowel movement on 6/7 Miralax added on 6/9 but patient refused. Importance of bowel evacuation was explained, patient  Initially agreed to take med and then refused again, including during this visit.  He wants to avoid enema as if possible. On 6/16 he received  lactulose, MiraLAX and Senokot. He had a bowel movement without difficulties.  Constipation resolved but planned to continue on laxatives to prevent severe constipation especially with additional oral pain medications  #13 Hypertension:  This was not well controlled with hydralazine and metroprolol for which amlodipine was added to the regimen on 6/5 at 10 mg daily, with blood pressure goal at 140/90. Blood pressure still elevated. Will monitor for now, may need increase of dose again. This is likely due to fluid retention.  Despite discontinuation of dexamethasone and reduction in IV fluids rate, no improvement was seen Metoprolol was added on 6/11, still elevated.  Dose was increased on 6/15 at 50 mg bid. Could be due to element of uncontrolled pain His systolic blood pressure reaching its goal, diastolic achieved its normal. Will monitor.   #14:  Hypokalemia In the setting of vomiting and diuretics for edema and hypertension. He received Kdur 20 meq on 6/7 with resolution of electrolyte imbalance.  #15 intermittent chest wall pain This is due to coughing. Recommend he try oral pain medicine first if possible in an attempt to wean him off intravenous medicines. Fentanyl patch was added on 6/15, immediate release oxycodone was increased to 15 mg.  Patient was encouraged try oral medicine first before IV medicine. As of 6/18, all his IV pain meds will be discontinued in an attempt to determine if he is ready to be discharged and to manage his symptoms as outpatent. I will increase dose of fentanyl patch today and discontinue IV Dilaudid in anticipation for discharge tomorrow.  #16 OT/PT OT/PT intervention would be  of use if patient is not discharged by the next 24 hrs, as he needs to increase mobility with assistance. Has not ambulated outside room for 5 days.   #17 discharge planning Plans for discharge if nausea, vomiting are better controlled and tube feeding is tolerated at a goal rate. Blood pressure reaching goal of 140/90 In addition, would be ideal to have patient discharged  the ability to tolerate oral antiemetics and oral pain medicine. Aim for discharge this Friday  **Disclaimer: This note was dictated with voice recognition software. Similar sounding words can inadvertently be transcribed and this note may contain transcription errors which may not have been corrected upon publication of note.Sharene Butters E, PA-C 10/03/2013  7:33 AM GORSUCH, NI, MD 10/03/2013

## 2013-10-03 NOTE — Progress Notes (Signed)
NUTRITION FOLLOW UP  Intervention: -Continue Osmolite 1.5 at new goal rate of 60 ml/hr and Pro-Stat 60 ml BID to meet minimum kcal needs as pt with negibile PO intake -Osmolite 1.5 at new goal rate of 60 ml/hr with Pro-Stat 60 ml BID will provide 2560 kcal (98% est kcal needs), 150 gram protein (100% est protein needs)  Nutrition Dx:   Inadequate oral intake related to persistent nausea/vomiting as evidenced by pt report of frequent emesis after any intake-ongoing   Goal:   Pt to meet >/=90% of estimated nutrition needs -progressing with TF   Monitor:   TF tolerance, weight trend, labs, GI profile, total protein/energy intake   Assessment:   5/28: - Pt had PEG placed on 08/15/13  - Pt received cycle 2 chemotherapy on 09/10/2013  - Pt followed by [facility] has lost 30 lbs x 1 month (9.1%), which is severe for time frame  - Pt reports poor PO intake x 1 month; dietary intake includes things like pudding, grits, jello, Ensure  - Pt reports using his PEG tube for the first time last night and he put 1 Ensure Plus and water in the tube. Pt reports that he vomited after using the tube  - Pt still very nauseous at time of assessment  - Recommend using PEG for sole source of nutrition until pt able to meet >/=50% of minimum estimated needs by PO intake  No muscle or subcutaneous fat depletion noticed.  K, Mg WNL  5/29: -Pt has refused initiation of TF d/t ongoing nausea and vomiting episodes. RN reported pt refused last night and this morning as he had 3 episodes emesis this AM. -Per discussion with RN, pt has only tolerated sips of gingerale -MD noted pt with mucous and gagging that exacerbates GI symptoms. Receiving Mucinex and phenergan. Possible TPN candidate if continues with intractable n/v and intolerance of EN. -Modified TF to start at lower trickle feed of 10 ml/hr to possibly assist in improving tolerance. Recommend Phos/Mg/K be monitored once pt able to  tolerate initiation of TF  6/01: -Vital AF 1.2 currently running at 50 ml/hr, providing 1440 kcal (55% est kcal needs), and 90 gram protein (64% est protein needs). Pt tolerating w/out residuals. Pt refused further TF advancement on 5/31 -IVF running at 150 ml/hr -MD noted pt continues with nausea but no vomiting. Exhibits mucous/phlem/gagging that makes PO intake difficult/nonexistent.  PO intake 0% -Pt agreeable to trial advancement of TF to 60 ml/hr during time of RD follow. Declined any PO beverages. Informed RN to advance TF and assess tolerance.  -Recommend to continue to advance to Vital AF 1.2 to goal of 90 ml/hr as tolerated -K low -Renal function improving with hydration  6/03: -Pt vomited after Vital AF 1.2 was advanced to 70 ml/hr. TF was decreased to 50 ml/hr -Per RN, pt refused to restart TF this morning d/t nausea. MD also noted pt refusing radiation treatments since 6/01 -Discussed TF regimen with pt. He noted some improvement in nausea and is w/out vomiting. 0% PO intake d/t nausea/dry heaves -Pt may benefit from lower volume of more nutrient dense TF -Pt in agreement to modify to higher calorie/protein formula Vital 1.5, with the addition of protein supplement Pro-Stat. -Vital 1.5 at goal rate of 50 ml/hr with Pro-stat QID will provide 85% est kcal needs, and 100% est protein needs.  -Will monitor TF tolerance and modify protein supplements as needed  6/04: -Vital 1.5 currently running  at 30 ml/hr. This provides 1080 kcal (42% est kcal needs), and 49 gram protein (35% est protein needs). Received one Pro-Stat yesterday and tolerated w/out nausea -Pt reported much improvement in nausea with TF adjustments. Was willing to continue with increase to goal rate of 50 ml/hr after radiation treatment today at 3pm -Discussed TF with RN. Encouraged to administer Pro-Stat via PEG to minimize nausea -0% PO intake -MD noted pt's elevated BUN/Crt may be related to steroids. Continuing  with IVF at 150 ml/hr. -Will monitor TF advancement and increase Pro-Stat as warranted  6/05: -Vital 1.5 currently running at 40 ml/hr. This provides 1440 kcal (55% est kcal needs), 65 gram protein (46% est protein needs). Pt tolerating two Pro-Stat daily -Refused TF advancement on 6/05 -Pt noted he was going to trial advancement later today. Verbalized understanding in importance of receiving Pro-Stat QID to assist in meeting protein/kcal needs.  -Discussed w/RN. Was in agreement to encourage Pro-Stat and TF advancement as tolerated   6/08: -Pt tolerating Vital 1.5 at 50 ml/hr. Denied nausea/vomiting -Reports tolerance of Pro-Stat QID, however per discussion with RN, pt has been refusing Pro-Stat and likely consuming only 1-2 supplement daily.  -Vital 1.5 at 50 ml/hr with Pro-Stat once daily is providing 1900 kcal (73% est kcal needs), and 96 gram protein (69% est protein needs) -Discussed alternatives to increase pt's compliance of protein supplement with RN and Pharmacy.  -Will trial Pro-stat 60 ml BID and modify as warranted -One episode vomiting on 6/07-RN noted mostly salvia and is tolerating TF w/66ml residuals  6/09: -Vital 1.5 at 50 ml/hr, tolerating with 0 ml residuals. Having some vomiting of mucous. Receiving magic mouthwash -Received Pro-Stat 60 ml BID, will continue this regimen vs 30 ml QID to encourage supplement compliance -Pt to undergo radiation today -W/out BM for 3 days, daily miralax started -IVF at 50 ml/hr for renal failure, BUN/Crt elevated but improving  6/10: -Pt reports consuming and tolerating ProStat 60 ml BID; however per discussion with RN, pt refusing. -Tolerating TF, pt noted some mucous that is relieved by Mucinex.  -Discussed pt with outpatient RD concerning pt's d/c nutrition needs. Will trial Osmolite 1.5 as this would be more affordable/accessible than the specialty formula Vital 1.5. If pt unable to tolerate, will need to be d/c on Vital 1.5. -  Outpatient RD noted that [facility] will likely assist pt with providing TF -[facility] unable to provide pt with TF at this time.  6/11: -Pt tolerating Osmolite 1.5 at 50 ml/hr w/out residuals and minimal nausea/muscous. Recommend to continue Osmolite 1.5 formula w/ProStat 60 ml BID on D/C. -Has refused both doses of Pro-Stat yesterday per nursing staff; however continues to report to RD that he consistently takes them. RD continues to affirm and educate pt on importance of supplements on a daily basis -RD did confirm with RN that pt did tolerate one 60 ml dose of Pro-Stat this morning 6/11. RN stated that pt was able to verbalize the importance of taking supplement. Encourage Nurse Tech and RN to continue to relay importance of Pro-Stat to meet >75% estimated nutrition needs  6/16: -Pt w/out BM since 6/7. Was started on Miralax, Senekot, and lactulose. Had BM on 6/15. -Consider addition of Reglan to assist in regular BMs, gastric motility, and improvement in nausea -Tolerating Osmolite 1.5 at 50 ml/hr. Pt taking Pro-Stat 60 ml via PEG. -RN noted pt continues with nausea and 0% PO intake.  -Weight trending down, likely d/t continued nausea/gagging, intolerance of TF and  minimal PO intake -As tolerated, increase Osmolite 1.5 by 5 ml every 4 hour to new goal rate of 60 ml/hf with Pro-Stat 60 ml BID to meet > 90% estimated needs.   6/17: -Per RN, pt tolerating Osmolite 1.5 at 50 ml/hr and ProStat 60 ml BID -Having regular BMs with current stool regimen, MD noted plan to continue pt on lactulose, Miralax and Senokot as pt at high risk for constipation d/t decreased mobility, and additional pain medications -Will recommend to advance to new goal rate conservatively as pt continues with negibile nutrition from PO intake. Osmolite 1.5 at 60 ml/hr with Pro-Stat 60 ml BID will provide 100% est nutrition needs -Pt resting during time of assessment  6/18: -Pt tolerating Osmolite 1.5 at new goal rate of  60 ml/hr, MD noted pt with some flatulence. Plan on adding simethicone to assist in relieving symptoms -Continue with Pro-Stat 60 ml BID -Having regular BMs with stool regimen -0% PO intake on [place] diet  Height: Ht Readings from Last 1 Encounters:  09/12/13 6\' 7"  (2.007 m)    Weight Status:   Wt Readings from Last 1 Encounters:  10/03/13 284 lb 6.3 oz (129 kg)  09/12/13 298 lbs  Re-estimated needs:  Kcal: 2600 - 2800  Protein: 140 - 160 grams  Fluid: 2.8 L fluid    Skin: chest incision, abdomen incision    Diet Order: Full Liquid   Intake/Output Summary (Last 24 hours) at 10/03/13 1040 Last data filed at 10/03/13 0711  Gross per 24 hour  Intake 2557.1 ml  Output    900 ml  Net 1657.1 ml    Last BM: 6/17   Labs:   Recent Labs Lab 09/27/13 0535 09/30/13 0522  NA 133* 132*  K 4.4 4.9  CL 97 93*  CO2 28 26  BUN 22 24*  CREATININE 1.23 1.27  CALCIUM 8.8 9.4  GLUCOSE 125* 124*    CBG (last 3)  No results found for this basename: GLUCAP,  in the last 72 hours  Scheduled Meds: . amLODipine  10 mg Oral Daily  . chlorhexidine  15 mL Mouth/Throat QID  . docusate sodium  200 mg Oral BID  . enoxaparin (LOVENOX) injection  40 mg Subcutaneous Q24H  . feeding supplement (PRO-STAT SUGAR FREE 64)  60 mL Per Tube BID BM  . fentaNYL  50 mcg Transdermal Q72H  . guaiFENesin  1,200 mg Oral BID  . hydrALAZINE  50 mg Oral 3 times per day  . lactulose  30 g Oral TID  . loratadine  10 mg Oral Daily  . magic mouthwash w/lidocaine  10 mL Oral QID  . metoprolol tartrate  50 mg Oral BID  . polyethylene glycol  17 g Oral Daily  . senna-docusate  2 tablet Oral BID    Continuous Infusions: . sodium chloride 50 mL/hr at 10/03/13 0230  . feeding supplement (OSMOLITE 1.5 CAL) 1,000 mL (10/03/13 0223)    Atlee Abide MS RD LDN Clinical Dietitian [phone]

## 2013-10-04 ENCOUNTER — Telehealth: Payer: Self-pay | Admitting: *Deleted

## 2013-10-04 ENCOUNTER — Ambulatory Visit
Admission: RE | Admit: 2013-10-04 | Discharge: 2013-10-04 | Disposition: A | Discharge status: Home / Self Care | Payer: Medicaid Other | Source: Ambulatory Visit | Attending: Radiation Oncology | Admitting: Radiation Oncology

## 2013-10-04 ENCOUNTER — Other Ambulatory Visit: Payer: Self-pay | Admitting: Hematology and Oncology

## 2013-10-04 MED ORDER — GUAIFENESIN ER 600 MG PO TB12: 1200.0000 mg | ORAL_TABLET | Freq: Two times a day (BID) | ORAL | Status: DC

## 2013-10-04 MED ORDER — AMLODIPINE BESYLATE 10 MG PO TABS: 10.0000 mg | ORAL_TABLET | Freq: Every day | ORAL | Status: DC

## 2013-10-04 MED ORDER — DSS 100 MG PO CAPS: 200.0000 mg | ORAL_CAPSULE | Freq: Two times a day (BID) | ORAL | Status: DC

## 2013-10-04 MED ORDER — LACTULOSE 10 GM/15ML PO SOLN: 30.0000 g | Freq: Two times a day (BID) | ORAL | Status: DC

## 2013-10-04 MED ORDER — METOPROLOL TARTRATE 50 MG PO TABS: 50.0000 mg | ORAL_TABLET | Freq: Two times a day (BID) | ORAL | Status: DC

## 2013-10-04 MED ORDER — HEPARIN SOD (PORK) LOCK FLUSH 100 UNIT/ML IV SOLN
500.0000 [IU] | INTRAVENOUS | Status: DC | PRN
  Filled 2013-10-04: qty 5

## 2013-10-04 MED ORDER — OXYCODONE HCL 15 MG PO TABS
15.0000 mg | ORAL_TABLET | ORAL | Status: DC | PRN
Start: 2013-10-04 — End: 2013-10-10

## 2013-10-04 MED ORDER — FENTANYL 50 MCG/HR TD PT72: 50.0000 ug | MEDICATED_PATCH | TRANSDERMAL | Status: DC

## 2013-10-04 MED ORDER — POLYETHYLENE GLYCOL 3350 17 G PO PACK: 17.0000 g | PACK | Freq: Every day | ORAL | Status: DC

## 2013-10-04 MED ORDER — HYDRALAZINE HCL 50 MG PO TABS: 50.0000 mg | ORAL_TABLET | Freq: Three times a day (TID) | ORAL | Status: DC

## 2013-10-04 NOTE — Progress Notes (Signed)
Mason [name]   DOB:01-08-1966   IE#:[id number]   ZYS#:[id number]  I have seen the patient, examined him and edited the notes as follows  Subjective:  Afebrile this morning.No new events overnight. Denies nausea. He admits to have strong gag reflex prior to hospitalization. He is trying to increase his oral intake, with small bites at a time, tolerating it well. Tube feeding at 60 ml/hr. No significant flatulence. Denies any respiratory or cardiac complaints. Denies lower extremity swelling.  Last bowel movement on 6/18  without blood in the stools with laxative support. No confusion. He received radiation on 6/4, to continue as planned. To date he received 20 of planned 35 treatments. Ambulating inside the room.  His pain control has improved with increased dose of fentanyl patch  Scheduled Meds: . amLODipine  10 mg Oral Daily  . chlorhexidine  15 mL Mouth/Throat QID  . docusate sodium  200 mg Oral BID  . enoxaparin (LOVENOX) injection  40 mg Subcutaneous Q24H  . feeding supplement (PRO-STAT SUGAR FREE 64)  60 mL Per Tube BID BM  . fentaNYL  50 mcg Transdermal Q72H  . guaiFENesin  1,200 mg Oral BID  . hydrALAZINE  50 mg Oral 3 times per day  . lactulose  30 g Oral TID  . loratadine  10 mg Oral Daily  . magic mouthwash w/lidocaine  10 mL Oral QID  . metoprolol tartrate  50 mg Oral BID  . polyethylene glycol  17 g Oral Daily  . senna-docusate  2 tablet Oral BID   Continuous Infusions: . sodium chloride 50 mL/hr at 10/03/13 2323  . feeding supplement (OSMOLITE 1.5 CAL) 1,000 mL (10/03/13 1644)   PRN Meds:.acetaminophen, lidocaine-prilocaine, LORazepam, metoprolol, ondansetron (ZOFRAN) IV, ondansetron (ZOFRAN) IV, ondansetron, ondansetron, oxyCODONE, promethazine  Objective:  Filed Vitals:   10/04/13 0601  BP: 153/80  Pulse: 104  Temp: 97.3 F (36.3 C)  Resp:     Body mass index is 32.2 kg/(m^2).  Intake/Output Summary (Last 24 hours) at 10/04/13 0730 Last data filed at 10/04/13  9323  Gross per 24 hour  Intake 2069.17 ml  Output   1600 ml  Net 469.17 ml    GENERAL: alert, no distress, less mucous secretions this morning SKIN: skin color, texture, turgor are normal, no rashes or significant lesions EYES: normal, conjunctiva are pink and non-injected, sclera clear OROPHARYNX: no exudate, no erythema and lips, buccal mucosa, and tongue normal. No mucositis NECK: supple, thyroid normal size, non-tender, without nodularity LYMPH:  Persistent palpable lymphadenopathy in the neck, improved compared to baseline. LUNGS: clear to auscultation and percussion with normal breathing effort. Left port normal HEART: regular rate & rhythm and no murmurs with trace bilateral lower extremity edema. ABDOMEN: abdomen soft, non-tender and normal bowel sounds. Feeding tube normal.  Musculoskeletal: no cyanosis of digits and no clubbing  PSYCH: alert & oriented x 3 with fluent speech NEURO: no focal motor/sensory deficits    CBG (last 3)  No results found for this basename: GLUCAP,  in the last 72 hours   Labs:   Recent Labs Lab 09/30/13 0522  WBC 2.1*  HGB 12.8*  HCT 38.4*  PLT 143*  MCV 87.5  MCH 29.2  MCHC 33.3  RDW 13.6  LYMPHSABS 0.3*  MONOABS 0.2  EOSABS 0.0  BASOSABS 0.0     Chemistries:    Recent Labs Lab 09/30/13 0522  NA 132*  K 4.9  CL 93*  CO2 26  GLUCOSE 124*  BUN 24*  CREATININE  1.27  CALCIUM 9.4  AST 24  ALT 43  ALKPHOS 101  BILITOT 0.3     Liver Function Tests:  Recent Labs Lab 09/30/13 0522  AST 24  ALT 43  ALKPHOS 101  BILITOT 0.3  PROT 7.5  ALBUMIN 3.4*   CBG: No results found for this basename: GLUCAP,  in the last 168 hours    Assessment/Plan:  #1 locally advanced tonsil cancer  s/p right tonsillar biopsy 07/09/2013 for a P16 positive squamous cell carcinoma, 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 He continued  radiation therapy while hospitalized. On 6/1 this had to be placed on hold due to vomiting. Reinforced the importance of radiation to the patient. As symptoms began to improve, radiation was reinitiated on 6/4. Symptoms are stable with lorazepam half an hour before his planned radiation for nausea and anxiety control. Appreciate Dr. Pearlie Oyster (Radiation Oncology) follow up. He is for treatment 29 of 35 today   #2 uncontrolled nausea and vomiting with gagging  Patient reported that his mucus buildup caused these symptoms He was placed on  IV fluids and IV anti-emetics around the lock with promethazine IV Q6, zofran IV Q8 prn as this has worked well in prior admission.  Mucinex was increased on 6/1 to decrease gagging.  Scopolamine was tried on 6/2, but discontinued due to dizziness and dry mouth as side effects.  Daily dexamethasone was added for to help with nausea with adequate results. Last dose on 6/6, plan to hold further doses Patient complained that feeding tube is contributing to nausea. His tube feeding was placed on hold on 6/3. On 6/4 it continued at a lower rate, initially at 30 ml, now at 50 ml per hour, initiated on 6/8.  CT scan of the head without contrast was negative for brain metastasis. However, it was positive for scattered sinus disease and mucosal thickening versus nasal polyps. Scheduled promethazine restarted and later discontinued. On 6/10 Phenergan was changed to as needed instead of scheduled dose. Scheduled IV antiemetics are being weaned off in anticipation of possible discharge in the near future. His nausea is well controlled with current regimen  #3 severe dehydration with recent renal failure  Secondary to severe vomiting and dehydration Placed on IV fluid resuscitation, at 150 ml/hour. Renal failure worsened as of 6/3 with BUN and Creatinine increasing. May be related to steroid initiation.  As of 6/5, there is a slight improvement of his BUN and Creatinine.  As of  6/7, his IV fluids were reduced to 50 cc/hour, as his renal functions continue to improve. Follow present therapy. His renal functions are now normal.He continues on current IV fluids rate until discharge, likely today. Planned for daily IVF as out-patient next week  #4 weight loss with malnutrition  Nutrition service consultation appreciated. PEG tube feeding had been increased to goal 90 ml/hr, then placed on hold. Patient complained that feeding tube was contributing to nausea. Total parenteral nutrition was considered as he continued to have nausea and vomiting. His rate was decreased to 30 ml/hr with better tolerance, achieving goal rate of 50 ml/hr as of 6/8. Patient is trying to increase oral intake as tolerated but due to nausea and secretions he is unable to do so. Goal rate of the tube feeding has been increased to 60 ml/h, tolerating without complications. He will resume bolus feeding at home  #5 DVT prophylaxis  On Lovenox. Of note,  patient refused 5/30 evening dose. Importance of Lovenox injection to reduce risk of thrombosis was discussed. He agreed to continue Lovenox injections daily.  #6 Hypoglycemia, resolved Due to decreased oral intake. Stable.   #7 Fever Likely reactive to inflammation and pain.  Resolved with antipyretics.  #8 Bilateral lower extremity swelling This is acute. May have been  exacerbated by IV fluids, malnutrition and decreased ambulation. Low suspicion for clot as patient is on Lovenox anticoagulation.  Improving. Continue to elevate legs and ambulate as tolerated.   IV fluids were decreased once renal function improved with improvement of this edema. serum albumin was therapeutically increasing since admission at 3.2 (on 6/8), currently at 3.4.  As of 6/17, no swelling noted. This medical problem was resolved  #9 CODE STATUS  Full code   #10 elevated liver function test  Likely related to recent treatment.  Resolved based on latest labs  #11  Mild pancytopenia This is likely related to recent therapy. The patient denies recent history of bleeding such as epistaxis, hematuria or hematochezia. He is asymptomatic from the pancytopenia.  On observation. He does not require transfusion now.   #12 Constipation Due to decreased mobility, dehydration, pain meds and poor oral intake. Last bowel movement on 6/7 Miralax added on 6/9 but patient refused. Importance of bowel evacuation was explained, patient  Initially agreed to take med and then refused again, including during this visit.  He wants to avoid enema as if possible. On 6/16 he received  lactulose, MiraLAX and Senokot. He had a bowel movement without difficulties.  Constipation resolved but planned to continue on laxatives as outpatient to prevent severe constipation especially with additional oral pain medications  #13 Hypertension:  This was not well controlled with hydralazine and metroprolol for which amlodipine was added to the regimen on 6/5 at 10 mg daily, with blood pressure goal at 140/90. Blood pressure still elevated. Will monitor for now, may need increase of dose again. This is likely due to fluid retention.  Despite discontinuation of dexamethasone and reduction in IV fluids rate, no improvement was seen Metoprolol was added on 6/11, still elevated.  Dose was increased on 6/15 at 50 mg bid. Could be due to element of uncontrolled pain His systolic blood pressure reaching its goal, diastolic achieved its normal.  He will need to have a follow-up with primary care provider as outpatient.   #14: Hypokalemia In the setting of vomiting and diuretics for edema and hypertension. He received Kdur 20 meq on 6/7 with resolution of electrolyte imbalance.  #15 intermittent chest wall pain This is due to coughing. Recommend he try oral pain medicine first if possible in an attempt to wean him off intravenous medicines. Fentanyl patch was added on 6/15, immediate release  oxycodone was increased to 15 mg.  Patient was encouraged try oral medicine first before IV medicine. As of 6/18, all his IV pain meds will be discontinued in an attempt to determine if he is ready to be discharged and to manage his symptoms as outpatent. Fentanyl patch was increased on 6/18, and  IV Dilaudid was discontinued  in anticipation for discharge today  #16 discharge planning He is to be discharged today after radiation treatment, as nausea and vomiting are better controlled and tube feeding is tolerated at a goal rate and he is able to tolerate oral antiemetics and oral pain medicine. Blood pressure reaching goal of 140/90 but will be monitored as outpatient.    **Disclaimer: This note was dictated with  voice recognition software. Similar sounding words can inadvertently be transcribed and this note may contain transcription errors which may not have been corrected upon publication of note.Sharene Butters E, PA-C 10/04/2013  7:30 AM Mason Smallman, MD 10/04/2013

## 2013-10-04 NOTE — Discharge Summary (Signed)
Patient ID: Mason [name] MRN: [id number] DOB/AGE: 46/22/1967 46 y.o.  Admit date: 09/12/2013 Discharge date: 10/04/2013  Primary Care Physician:  Chari Manning, NP  I have seen the patient, examined him and edited the notes as follows   Discharge Diagnoses:    1. Tonsillar cancer   2. Protein-calorie malnutrition, severe   3. Nausea & vomiting   4. Chronic periodontitis   5. Unspecified essential hypertension   6. Hypokalemia   7. Constipation   8. Essential hypertension, benign   9. Other chest pain   10. Unspecified constipation     Present on Admission:  . Nausea & vomiting  Discharge Medications:    Medication List    STOP taking these medications       ondansetron 8 MG tablet  Commonly known as:  ZOFRAN      TAKE these medications       amLODipine 10 MG tablet  Commonly known as:  NORVASC  Take 1 tablet (10 mg total) by mouth daily.     chlorhexidine 0.12 % solution  Commonly known as:  PERIDEX  Use as directed 15 mLs in the mouth or throat 2 (two) times daily. Use for 30 seconds after breakfast and dinner. Spit out excess. Do not swallow.     DSS 100 MG Caps  Take 200 mg by mouth 2 (two) times daily.     emollient cream  Commonly known as:  BIAFINE  Apply 1 application topically 2 (two) times daily. Apply to affected skin area afterrad and bedtime daily and on weekends daily     fentaNYL 50 MCG/HR  Commonly known as:  DURAGESIC - dosed mcg/hr  Place 1 patch (50 mcg total) onto the skin every 3 (three) days.     guaiFENesin 600 MG 12 hr tablet  Commonly known as:  MUCINEX  Take 2 tablets (1,200 mg total) by mouth 2 (two) times daily.     hydrALAZINE 50 MG tablet  Commonly known as:  APRESOLINE  Take 1 tablet (50 mg total) by mouth every 8 (eight) hours.     lactulose 10 GM/15ML solution  Commonly known as:  CHRONULAC  Take 45 mLs (30 g total) by mouth 2 (two) times daily.     lidocaine-prilocaine cream  Commonly known as:  EMLA   Apply 1 application topically as needed (For port-a-cath.).     magic mouthwash w/lidocaine Soln  Swish and swallow 10 mLs 4 (four) times daily. Take 30 minutes before meals and at bedtime.     metoprolol 50 MG tablet  Commonly known as:  LOPRESSOR  Take 1 tablet (50 mg total) by mouth 2 (two) times daily.     Oxycodone HCl 10 MG Tabs  Take 1 tablet (10 mg total) by mouth every 4 (four) hours as needed for severe pain.     oxyCODONE 15 MG immediate release tablet  Commonly known as:  ROXICODONE  Take 1 tablet (15 mg total) by mouth every 3 (three) hours as needed for severe pain.     polyethylene glycol packet  Commonly known as:  MIRALAX / GLYCOLAX  Take 17 g by mouth daily.     prochlorperazine 10 MG tablet  Commonly known as:  COMPAZINE  Take 1 tablet (10 mg total) by mouth every 6 (six) hours as needed (Nausea or vomiting).     sodium fluoride 1.1 % Gel dental gel  Commonly known as:  FLUORISHIELD  Place 1 drop onto teeth at bedtime. Instill  one drop into each tooth space of fluoride tray. Place over teeth for 5 minutes. Remove. Spit out excess. Repeat nightly.     sucralfate 1 G tablet  Commonly known as:  CARAFATE  Take 1 g by mouth 4 (four) times daily as needed (Dissolve one tablet in 18ml of water and swallow for sore throat.).        Diet:  Scheduled tube feeds and soft- advance as tolerated diet.  Activity: as tolerated    Disposition and Follow-up:   ECOG PERFORMANCE STATUS: 1 Follow Up with Dr. Alvy Bimler. Office to call for appointment. He will continue his daily radiation treatments to completion under Dr. Pearlie Oyster direction.  Significant Diagnostic Studies:  Ct Head Wo Contrast  09/19/2013   CLINICAL DATA:  Severe nausea and hiccups. Tonsillar cancer. Question brain metastases.  EXAM: CT HEAD WITHOUT CONTRAST  TECHNIQUE: Contiguous axial images were obtained from the base of the skull through the vertex without intravenous contrast.  COMPARISON:  None  available.  FINDINGS: No acute infarct, hemorrhage, or mass lesion is present. There is no significant edema to suggest metastases. The ventricles are of normal size. No significant extra-axial fluid collection is present.  Circumferential mucosal thickening is present in the right maxillary sinus and scattered ethmoid air cells. Mucosal thickening are possibly a polyps are present within the nasal cavity. The remaining paranasal sinuses are clear. The mastoid air cells are clear  IMPRESSION: 1. Normal CT appearance of the brain. 2. Scattered sinus disease. 3. Mucosal thickening versus nasal polyps.   Electronically Signed   By: Lawrence Santiago M.D.   On: 09/19/2013 13:55    Discharge Laboratory Values: Lab Results  Component Value Date   WBC 2.1* 09/30/2013   HGB 12.8* 09/30/2013   HCT 38.4* 09/30/2013   MCV 87.5 09/30/2013   PLT 143* 09/30/2013   Lab Results  Component Value Date   NA 132* 09/30/2013   K 4.9 09/30/2013   CL 93* 09/30/2013   CO2 26 09/30/2013    Brief H and P: For complete details please refer to admission H and P, but in brief,  Mason [name] 48 y.o. male is here because of uncontrolled nausea and vomiting. 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  He continued radiation therapy while hospitalized. On 6/1 this had to be placed on hold due to vomiting. Reinforced the importance of radiation to the patient. As symptoms began to improve, radiation was reinitiated on 6/4. Symptoms are stable with lorazepam half an hour before his planned radiation for nausea and anxiety control. Appreciate Dr. Pearlie Oyster (Radiation Oncology) follow up. He is for treatment 29 of 35 today    #2 uncontrolled nausea and vomiting with gagging  Patient reported that his mucus buildup caused these symptoms  He was placed on IV fluids and IV anti-emetics around the lock with promethazine IV Q6, zofran IV Q8 prn as this has worked well in prior admission.  Mucinex was increased on 6/1 to decrease gagging.  Scopolamine was tried on 6/2, but discontinued due to dizziness and dry mouth as side effects.  Daily dexamethasone was added for to help with nausea with adequate results. Last dose on 6/6,  plan to hold further doses  Patient complained that feeding tube is contributing to nausea. His tube feeding was placed on hold on 6/3. On 6/4 it continued at a lower rate, initially at 30 ml, now at 50 ml per hour, initiated on 6/8.  CT scan of the head without contrast was negative for brain metastasis. However, it was positive for scattered sinus disease and mucosal thickening versus nasal polyps.  Scheduled promethazine restarted and later discontinued. On 6/10 Phenergan was changed to as needed instead of scheduled dose. Scheduled IV antiemetics are being weaned off in anticipation of possible discharge in the near future.  His nausea is well controlled with current regimen   #3 severe dehydration with recent renal failure  Secondary to severe vomiting and dehydration  Placed on IV fluid resuscitation, at 150 ml/hour.  Renal failure worsened as of 6/3 with BUN and Creatinine increasing. May be related to steroid initiation.  As of 6/5, there is a slight improvement of his BUN and Creatinine.  As of 6/7, his IV fluids were reduced to 50 cc/hour, as his renal functions continue to improve. Follow present therapy.  His renal functions are now normal.He continues on current IV fluids rate until time of discharge and will continue daily next week as outpatient.  #4 weight loss with malnutrition  Nutrition service consultation appreciated. PEG tube feeding had been increased to goal 90 ml/hr, then  placed on hold. Patient complained that feeding tube was contributing to nausea. Total parenteral nutrition was considered as he continued to have nausea and vomiting. His rate was decreased to 30 ml/hr with better tolerance, achieving goal rate of 50 ml/hr as of 6/8.  Patient is trying to increase oral intake as tolerated but due to nausea and secretions he is unable to do so.  Goal rate of the tube feeding has been increased to 60 ml/h, tolerated  without complications. He will resume bolus feeding at discharge  #5 DVT prophylaxis  On Lovenox. Of note, patient refused 5/30 evening dose.  Importance of Lovenox injection to reduce risk of thrombosis was discussed. He agreed to continue Lovenox injections daily.   #6 Hypoglycemia, resolved  Due to decreased oral intake.  Stable.   #7 Fever  Likely reactive to inflammation and pain.  Resolved with antipyretics.   #8 Bilateral lower extremity swelling  This is acute. May have been exacerbated by IV fluids, malnutrition and decreased ambulation.  Low suspicion for clot as patient is on Lovenox anticoagulation.  Improving. Continue to elevate legs and ambulate as tolerated.  IV fluids were decreased once renal function improved with improvement of this edema.  serum albumin was therapeutically increasing since admission at 3.2 (on 6/8), currently at 3.4.  As of 6/17, no swelling noted. This medical problem was resolved   #9 CODE STATUS  Full code  #10 elevated liver function test  Likely related to recent treatment.  Resolved based on latest labs   #11 Mild pancytopenia  This is likely related to recent therapy. The patient denies recent history of bleeding such as epistaxis, hematuria or hematochezia. He is asymptomatic from the pancytopenia.  On observation. He does not require transfusion  #12 Constipation  Due to decreased mobility, dehydration, pain meds and poor oral intake.  Last bowel movement on 6/7  Miralax added on 6/9 but  patient refused. Importance of bowel evacuation was explained, patient Initially agreed to take med and then refused again, including during this visit. He wants to avoid enema as  if possible.  On 6/16 he received lactulose, MiraLAX and Senokot. He had a bowel movement without difficulties.  Constipation resolved but planned to continue on laxatives as outpatient to prevent severe constipation especially with additional oral pain medications   #13 Hypertension:  This was not well controlled with hydralazine and metroprolol for which amlodipine was added to the regimen on 6/5 at 10 mg daily, with blood pressure goal at 140/90. Blood pressure still elevated. Will monitor for now, may need increase of dose again.  This is likely due to fluid retention.  Despite discontinuation of dexamethasone and reduction in IV fluids rate, no improvement was seen  Metoprolol was added on 6/11, still elevated.  Dose was increased on 6/15 at 50 mg bid. Could be due to element of uncontrolled pain  His systolic blood pressure reaching its goal, diastolic achieved its normal.  He will need to have a  Follow up with primary care provider as outpatient.   #14: Hypokalemia  In the setting of vomiting and diuretics for edema and hypertension.  He received Kdur 20 meq on 6/7 with resolution of electrolyte imbalance.   #15 intermittent chest wall pain  This is due to coughing.  Recommend he try oral pain medicine first if possible in an attempt to wean him off intravenous medicines.  Fentanyl patch was added on 6/15, immediate release oxycodone was increased to 15 mg.  Patient was encouraged try oral medicine first before IV medicine.  As of 6/18, all his IV pain meds will be discontinued in an attempt to determine if he is ready to be discharged and to manage his symptoms as outpatent.  Fentanyl patch was increased, and IV Dilaudid was discontinued in anticipation for discharge today   #16 discharge planning  He is  to be discharged today after radiation treatment, as nausea and vomiting are better controlled and tube feeding is tolerated at a goal rate and he is able to tolerate oral antiemetics and oral pain medicine.  Blood pressure reaching goal of 140/90 but will be monitored as outpatient.       Physical Exam at Discharge: BP 153/80  Pulse 104  Temp(Src) 97.3 F (36.3 C) (Oral)  Resp 16  Ht 6\' 7"  (2.007 m)  Wt 285 lb 15 oz (129.7 kg)  BMI 32.20 kg/m2  SpO2 95%  GENERAL: alert, no distress, less mucous secretions this morning  SKIN: skin color, texture, turgor are normal, no rashes or significant lesions  EYES: normal, conjunctiva are pink and non-injected, sclera clear  OROPHARYNX: no exudate, no erythema and lips, buccal mucosa, and tongue normal. No mucositis  NECK: supple, thyroid normal size, non-tender, without nodularity  LYMPH: Persistent palpable lymphadenopathy in the neck, improved compared to baseline.  LUNGS: clear to auscultation and percussion with normal breathing effort. Left port normal  HEART: regular rate & rhythm and no murmurs with trace bilateral lower extremity edema.  ABDOMEN: abdomen soft, non-tender and normal bowel sounds. Feeding tube normal.  Musculoskeletal: no cyanosis of digits and no clubbing  PSYCH: alert & oriented x 3 with fluent speech  NEURO: no focal motor/sensory deficits    Signed: Sharene Butters, PA-C   10/04/2013, 7:56 AM GORSUCH, NI, MD 10/04/2013 Spent 40 minutes on discharge

## 2013-10-04 NOTE — Telephone Encounter (Signed)
Prior to admission was followed by [place]. Made aware he is being discharged today. Called to inquire if he needs home health after discharge? Will he be on tube feedings? If so, need orders.

## 2013-10-04 NOTE — Progress Notes (Signed)
NUTRITION FOLLOW UP  Intervention: -For discharge needs, pt will require bolus feeds of Osmolite 1.5 at 6 cans (237 ml) daily w/Pro-Stat 60 ml BID. Bolus regimen with Pro-Stat 60 ml BID will provide 2530 kcal (97% est kcal needs), 150 gram protein (100% est protein needs), and 905 ml free water. Recommend to flush 90 ml before and after bolus feeds. Will require additional 200 ml free water flush Q6H -Continue Osmolite 1.5 at goal rate of 60 ml/hr and Pro-Stat 60 ml BID   Nutrition Dx:   Inadequate oral intake related to persistent nausea/vomiting as evidenced by pt report of frequent emesis after any intake-ongoing   Goal:   Pt to meet >/=90% of estimated nutrition needs -progressing with TF   Monitor:   TF tolerance, weight trend, labs, GI profile, total protein/energy intake   Assessment:   5/28: - Pt had PEG placed on 08/15/13  - Pt received cycle 2 chemotherapy on 09/10/2013  - Pt followed by [facility] has lost 30 lbs x 1 month (9.1%), which is severe for time frame  - Pt reports poor PO intake x 1 month; dietary intake includes things like pudding, grits, jello, Ensure  - Pt reports using his PEG tube for the first time last night and he put 1 Ensure Plus and water in the tube. Pt reports that he vomited after using the tube  - Pt still very nauseous at time of assessment  - Recommend using PEG for sole source of nutrition until pt able to meet >/=50% of minimum estimated needs by PO intake  No muscle or subcutaneous fat depletion noticed.  K, Mg WNL  5/29: -Pt has refused initiation of TF d/t ongoing nausea and vomiting episodes. RN reported pt refused last night and this morning as he had 3 episodes emesis this AM. -Per discussion with RN, pt has only tolerated sips of gingerale -MD noted pt with mucous and gagging that exacerbates GI symptoms. Receiving Mucinex and phenergan. Possible TPN candidate if continues with intractable n/v and intolerance of  EN. -Modified TF to start at lower trickle feed of 10 ml/hr to possibly assist in improving tolerance. Recommend Phos/Mg/K be monitored once pt able to tolerate initiation of TF  6/01: -Vital AF 1.2 currently running at 50 ml/hr, providing 1440 kcal (55% est kcal needs), and 90 gram protein (64% est protein needs). Pt tolerating w/out residuals. Pt refused further TF advancement on 5/31 -IVF running at 150 ml/hr -MD noted pt continues with nausea but no vomiting. Exhibits mucous/phlem/gagging that makes PO intake difficult/nonexistent.  PO intake 0% -Pt agreeable to trial advancement of TF to 60 ml/hr during time of RD follow. Declined any PO beverages. Informed RN to advance TF and assess tolerance.  -Recommend to continue to advance to Vital AF 1.2 to goal of 90 ml/hr as tolerated -K low -Renal function improving with hydration  6/03: -Pt vomited after Vital AF 1.2 was advanced to 70 ml/hr. TF was decreased to 50 ml/hr -Per RN, pt refused to restart TF this morning d/t nausea. MD also noted pt refusing radiation treatments since 6/01 -Discussed TF regimen with pt. He noted some improvement in nausea and is w/out vomiting. 0% PO intake d/t nausea/dry heaves -Pt may benefit from lower volume of more nutrient dense TF -Pt in agreement to modify to higher calorie/protein formula Vital 1.5, with the addition of protein supplement Pro-Stat. -Vital 1.5 at goal rate of 50 ml/hr with Pro-stat QID will  provide 85% est kcal needs, and 100% est protein needs.  -Will monitor TF tolerance and modify protein supplements as needed  6/04: -Vital 1.5 currently running at 30 ml/hr. This provides 1080 kcal (42% est kcal needs), and 49 gram protein (35% est protein needs). Received one Pro-Stat yesterday and tolerated w/out nausea -Pt reported much improvement in nausea with TF adjustments. Was willing to continue with increase to goal rate of 50 ml/hr after radiation treatment today at 3pm -Discussed TF with  RN. Encouraged to administer Pro-Stat via PEG to minimize nausea -0% PO intake -MD noted pt's elevated BUN/Crt may be related to steroids. Continuing with IVF at 150 ml/hr. -Will monitor TF advancement and increase Pro-Stat as warranted  6/05: -Vital 1.5 currently running at 40 ml/hr. This provides 1440 kcal (55% est kcal needs), 65 gram protein (46% est protein needs). Pt tolerating two Pro-Stat daily -Refused TF advancement on 6/05 -Pt noted he was going to trial advancement later today. Verbalized understanding in importance of receiving Pro-Stat QID to assist in meeting protein/kcal needs.  -Discussed w/RN. Was in agreement to encourage Pro-Stat and TF advancement as tolerated   6/08: -Pt tolerating Vital 1.5 at 50 ml/hr. Denied nausea/vomiting -Reports tolerance of Pro-Stat QID, however per discussion with RN, pt has been refusing Pro-Stat and likely consuming only 1-2 supplement daily.  -Vital 1.5 at 50 ml/hr with Pro-Stat once daily is providing 1900 kcal (73% est kcal needs), and 96 gram protein (69% est protein needs) -Discussed alternatives to increase pt's compliance of protein supplement with RN and Pharmacy.  -Will trial Pro-stat 60 ml BID and modify as warranted -One episode vomiting on 6/07-RN noted mostly salvia and is tolerating TF w/13ml residuals  6/09: -Vital 1.5 at 50 ml/hr, tolerating with 0 ml residuals. Having some vomiting of mucous. Receiving magic mouthwash -Received Pro-Stat 60 ml BID, will continue this regimen vs 30 ml QID to encourage supplement compliance -Pt to undergo radiation today -W/out BM for 3 days, daily miralax started -IVF at 50 ml/hr for renal failure, BUN/Crt elevated but improving  6/10: -Pt reports consuming and tolerating ProStat 60 ml BID; however per discussion with RN, pt refusing. -Tolerating TF, pt noted some mucous that is relieved by Mucinex.  -Discussed pt with outpatient RD concerning pt's d/c nutrition needs. Will trial Osmolite  1.5 as this would be more affordable/accessible than the specialty formula Vital 1.5. If pt unable to tolerate, will need to be d/c on Vital 1.5. - Outpatient RD noted that [facility] will likely assist pt with providing TF -[facility] unable to provide pt with TF at this time.  6/11: -Pt tolerating Osmolite 1.5 at 50 ml/hr w/out residuals and minimal nausea/muscous. Recommend to continue Osmolite 1.5 formula w/ProStat 60 ml BID on D/C. -Has refused both doses of Pro-Stat yesterday per nursing staff; however continues to report to RD that he consistently takes them. RD continues to affirm and educate pt on importance of supplements on a daily basis -RD did confirm with RN that pt did tolerate one 60 ml dose of Pro-Stat this morning 6/11. RN stated that pt was able to verbalize the importance of taking supplement. Encourage Nurse Tech and RN to continue to relay importance of Pro-Stat to meet >75% estimated nutrition needs  6/16: -Pt w/out BM since 6/7. Was started on Miralax, Senekot, and lactulose. Had BM on 6/15. -Consider addition of Reglan to assist in regular BMs, gastric motility, and improvement in nausea -Tolerating Osmolite 1.5 at 50 ml/hr. Pt taking  Pro-Stat 60 ml via PEG. -RN noted pt continues with nausea and 0% PO intake.  -Weight trending down, likely d/t continued nausea/gagging, intolerance of TF and minimal PO intake -As tolerated, increase Osmolite 1.5 by 5 ml every 4 hour to new goal rate of 60 ml/hf with Pro-Stat 60 ml BID to meet > 90% estimated needs.   6/17: -Per RN, pt tolerating Osmolite 1.5 at 50 ml/hr and ProStat 60 ml BID -Having regular BMs with current stool regimen, MD noted plan to continue pt on lactulose, Miralax and Senokot as pt at high risk for constipation d/t decreased mobility, and additional pain medications -Will recommend to advance to new goal rate conservatively as pt continues with negibile nutrition from PO intake. Osmolite 1.5 at 60 ml/hr with  Pro-Stat 60 ml BID will provide 100% est nutrition needs -Pt resting during time of assessment  6/18: -Pt tolerating Osmolite 1.5 at new goal rate of 60 ml/hr, MD noted pt with some flatulence. Plan on adding simethicone to assist in relieving symptoms -Continue with Pro-Stat 60 ml BID -Having regular BMs with stool regimen -0% PO intake on FL diet   6/19: -Pt to be discharged today with bolus regimen per MD note. Placed bolus feeding recommendations with Pro-Stat 60 ml BID   Height: Ht Readings from Last 1 Encounters:  09/12/13 6\' 7"  (2.007 m)    Weight Status:   Wt Readings from Last 1 Encounters:  10/04/13 285 lb 15 oz (129.7 kg)  09/12/13 298 lbs  Re-estimated needs:  Kcal: 2600 - 2800  Protein: 140 - 160 grams  Fluid: 2.8 L fluid    Skin: chest incision, abdomen incision    Diet Order: Full Liquid   Intake/Output Summary (Last 24 hours) at 10/04/13 0901 Last data filed at 10/04/13 0817  Gross per 24 hour  Intake 2069.17 ml  Output   1975 ml  Net  94.17 ml    Last BM: 6/17   Labs:   Recent Labs Lab 09/30/13 0522  NA 132*  K 4.9  CL 93*  CO2 26  BUN 24*  CREATININE 1.27  CALCIUM 9.4  GLUCOSE 124*    CBG (last 3)  No results found for this basename: GLUCAP,  in the last 72 hours  Scheduled Meds: . amLODipine  10 mg Oral Daily  . chlorhexidine  15 mL Mouth/Throat QID  . docusate sodium  200 mg Oral BID  . enoxaparin (LOVENOX) injection  40 mg Subcutaneous Q24H  . feeding supplement (PRO-STAT SUGAR FREE 64)  60 mL Per Tube BID BM  . fentaNYL  50 mcg Transdermal Q72H  . guaiFENesin  1,200 mg Oral BID  . hydrALAZINE  50 mg Oral 3 times per day  . lactulose  30 g Oral TID  . loratadine  10 mg Oral Daily  . magic mouthwash w/lidocaine  10 mL Oral QID  . metoprolol tartrate  50 mg Oral BID  . polyethylene glycol  17 g Oral Daily  . senna-docusate  2 tablet Oral BID    Continuous Infusions: . sodium chloride 50 mL/hr at 10/03/13 2323  .  feeding supplement (OSMOLITE 1.5 CAL) 1,000 mL (10/03/13 1644)    Atlee Abide MS RD LDN Clinical Dietitian [phone]

## 2013-10-04 NOTE — Progress Notes (Signed)
Visited patient in [place] to check on well being.  He stated "doing OK", expressed concern about prognosis.  I recognized his concerns, encouraged him to recognize the small improvements he has been making.  He expressed appreciation for my visit.  Gayleen Orem, RN, BSN, [facility] Navigator [phone]

## 2013-10-04 NOTE — Telephone Encounter (Signed)
Left VM for [facility] w[facility] to resume home care upon discharge.

## 2013-10-04 NOTE — Progress Notes (Signed)
Pt discharged home with fiance in stable condition. Discharge instructions and scripts given. Pt verbalized understanding.

## 2013-10-04 NOTE — Telephone Encounter (Signed)
Per staff message and POF I have scheduled appts. Tired to call patient, phone not working. I left a message in radiation appt for patient to pick up schedule.JMW

## 2013-10-04 NOTE — Telephone Encounter (Signed)
He will be Dc today, would be great if they can resume care at home after today Thanks

## 2013-10-07 ENCOUNTER — Other Ambulatory Visit: Payer: Self-pay | Admitting: Hematology and Oncology

## 2013-10-07 ENCOUNTER — Ambulatory Visit (HOSPITAL_BASED_OUTPATIENT_CLINIC_OR_DEPARTMENT_OTHER): Payer: Medicaid Other

## 2013-10-07 ENCOUNTER — Ambulatory Visit
Admission: RE | Admit: 2013-10-07 | Discharge: 2013-10-07 | Disposition: A | Discharge status: Home / Self Care | Payer: Medicaid Other | Source: Ambulatory Visit | Attending: Radiation Oncology | Admitting: Radiation Oncology

## 2013-10-07 ENCOUNTER — Encounter: Payer: Self-pay | Admitting: Radiation Oncology

## 2013-10-07 VITALS — BP 148/78 | HR 101 | Ht 79.0 in | Wt 285.0 lb

## 2013-10-07 VITALS — BP 140/90 | HR 89 | Temp 97.0°F | Resp 18

## 2013-10-07 DIAGNOSIS — Z510 Encounter for antineoplastic radiation therapy: Secondary | ICD-10-CM | POA: Diagnosis present

## 2013-10-07 DIAGNOSIS — C099 Malignant neoplasm of tonsil, unspecified: Secondary | ICD-10-CM

## 2013-10-07 DIAGNOSIS — R1110 Vomiting, unspecified: Secondary | ICD-10-CM

## 2013-10-07 DIAGNOSIS — M545 Low back pain, unspecified: Secondary | ICD-10-CM | POA: Insufficient documentation

## 2013-10-07 DIAGNOSIS — R112 Nausea with vomiting, unspecified: Secondary | ICD-10-CM

## 2013-10-07 MED ORDER — LORAZEPAM 1 MG PO TABS
ORAL_TABLET | ORAL | Status: AC
  Filled 2013-10-07: qty 1

## 2013-10-07 MED ORDER — HEPARIN SOD (PORK) LOCK FLUSH 100 UNIT/ML IV SOLN
500.0000 [IU] | Freq: Once | INTRAVENOUS | Status: AC | PRN
  Administered 2013-10-07: 500 [IU]
  Filled 2013-10-07: qty 5

## 2013-10-07 MED ORDER — SODIUM CHLORIDE 0.9 % IV SOLN
1500.0000 mL | Freq: Every day | INTRAVENOUS | Status: DC | PRN
  Administered 2013-10-07: 14:00:00 via INTRAVENOUS

## 2013-10-07 MED ORDER — MORPHINE SULFATE 15 MG PO TABS
15.0000 mg | ORAL_TABLET | Freq: Once | ORAL | Status: AC
  Administered 2013-10-07: 15 mg via ORAL

## 2013-10-07 MED ORDER — SODIUM CHLORIDE 0.9 % IJ SOLN
10.0000 mL | INTRAMUSCULAR | Status: DC | PRN
  Administered 2013-10-07: 10 mL
  Filled 2013-10-07: qty 10

## 2013-10-07 MED ORDER — PROMETHAZINE HCL 25 MG/ML IJ SOLN
25.0000 mg | Freq: Every day | INTRAMUSCULAR | Status: DC | PRN
  Administered 2013-10-07: 25 mg via INTRAVENOUS
  Filled 2013-10-07: qty 1

## 2013-10-07 MED ORDER — MORPHINE SULFATE 15 MG PO TABS
ORAL_TABLET | ORAL | Status: AC
  Filled 2013-10-07: qty 1

## 2013-10-07 MED ORDER — LORAZEPAM 1 MG PO TABS: 1.0000 mg | ORAL_TABLET | Freq: Three times a day (TID) | ORAL | Status: DC

## 2013-10-07 MED ORDER — LORAZEPAM 1 MG PO TABS
1.0000 mg | ORAL_TABLET | Freq: Every day | ORAL | Status: DC | PRN
  Administered 2013-10-07: 1 mg via ORAL

## 2013-10-07 NOTE — Progress Notes (Signed)
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  He c/o sore throat and note very thickened stringy saliva in his oral cavity  Note whitish areas in the throat.  Using Mucinex, but encouraging increased water and other liquids via peg tube and orally if he can tolerate. Mouth breathing due to nasal congestion. Peg tube site clear.  Skin with an ashen appearance and skin on neck dark,but intact.  Travel by wheelchair.  Refused to weight today.  Last weight documented was on 285 on 10/04/13

## 2013-10-07 NOTE — Progress Notes (Signed)
Pt reporting pain in chest 9/10 that radiates to back and is brought on by coughing/dry-heaving. Pt was given phenergan upon arrival and he reports his nausea is resolved. Pt has not taken his oxycodone home rx since early this morning and does not have it with him now. He also does not have a fent patch on at this time, he states he is due to apply one when he gets home today. Dr. Alvy Bimler notified of the above. Per MD, instruct patient to carry home pain meds with him when he travels to [facility] and take them as prescribed; MD to order prn pain med while in infusion room at this time. Pt informed of the above instructions and verbalizes understanding. This RN to monitor and follow up.

## 2013-10-07 NOTE — Patient Instructions (Signed)
Dehydration, Adult Dehydration is when you lose more fluids from the body than you take in. Vital organs like the kidneys, brain, and heart cannot function without a proper amount of fluids and salt. Any loss of fluids from the body can cause dehydration.  CAUSES   Vomiting.  Diarrhea.  Excessive sweating.  Excessive urine output.  Fever. SYMPTOMS  Mild dehydration  Thirst.  Dry lips.  Slightly dry mouth. Moderate dehydration  Very dry mouth.  Sunken eyes.  Skin does not bounce back quickly when lightly pinched and released.  Dark urine and decreased urine production.  Decreased tear production.  Headache. Severe dehydration  Very dry mouth.  Extreme thirst.  Rapid, weak pulse (more than 100 beats per minute at rest).  Cold hands and feet.  Not able to sweat in spite of heat and temperature.  Rapid breathing.  Blue lips.  Confusion and lethargy.  Difficulty being awakened.  Minimal urine production.  No tears. DIAGNOSIS  Your caregiver will diagnose dehydration based on your symptoms and your exam. Blood and urine tests will help confirm the diagnosis. The diagnostic evaluation should also identify the cause of dehydration. TREATMENT  Treatment of mild or moderate dehydration can often be done at home by increasing the amount of fluids that you drink. It is best to drink small amounts of fluid more often. Drinking too much at one time can make vomiting worse. Refer to the home care instructions below. Severe dehydration needs to be treated at the hospital where you will probably be given intravenous (IV) fluids that contain water and electrolytes. HOME CARE INSTRUCTIONS   Ask your caregiver about specific rehydration instructions.  Drink enough fluids to keep your urine clear or pale yellow.  Drink small amounts frequently if you have nausea and vomiting.  Eat as you normally do.  Avoid:  Foods or drinks high in sugar.  Carbonated  drinks.  Juice.  Extremely hot or cold fluids.  Drinks with caffeine.  Fatty, greasy foods.  Alcohol.  Tobacco.  Overeating.  Gelatin desserts.  Wash your hands well to avoid spreading bacteria and viruses.  Only take over-the-counter or prescription medicines for pain, discomfort, or fever as directed by your caregiver.  Ask your caregiver if you should continue all prescribed and over-the-counter medicines.  Keep all follow-up appointments with your caregiver. SEEK MEDICAL CARE IF:  You have abdominal pain and it increases or stays in one area (localizes).  You have a rash, stiff neck, or severe headache.  You are irritable, sleepy, or difficult to awaken.  You are weak, dizzy, or extremely thirsty. SEEK IMMEDIATE MEDICAL CARE IF:   You are unable to keep fluids down or you get worse despite treatment.  You have frequent episodes of vomiting or diarrhea.  You have blood or green matter (bile) in your vomit.  You have blood in your stool or your stool looks black and tarry.  You have not urinated in 6 to 8 hours, or you have only urinated a small amount of very dark urine.  You have a fever.  You faint. MAKE SURE YOU:   Understand these instructions.  Will watch your condition.  Will get help right away if you are not doing well or get worse. Document Released: 04/04/2005 Document Revised: 06/27/2011 Document Reviewed: 11/22/2010 ExitCare Patient Information 2015 [employer]. This information is not intended to replace advice given to you by your health care provider. Make sure you discuss any questions you have with your health care   provider.  

## 2013-10-07 NOTE — Progress Notes (Signed)
   Weekly Management Note:  outpatient Current Dose:  58 Gy  Projected Dose: 70 Gy   Narrative:  The patient presents for routine under treatment assessment.  CBCT/MVCT images/Port film x-rays were reviewed.  The chart was checked. Did better this weekend post hospital discharge. Chemotherapy being held. Poor PO and PEG intake attributed to lack of motivation and lack of knowledge (cannot remember instructions from [facility]  Physical Findings:  height is 6\' 7"  (2.007 m) and weight is 285 lb (129.275 kg). His blood pressure is 148/78 and his pulse is 101.  In wheelchair.  Dry mucous membranes. No thrush. Ropey saliva.  GAG reflex. Hard to examine throat.  Neck - persistent adenopathy  in right neck. Skin dry, pigmented  CBC    Component Value Date/Time   WBC 2.1* 09/30/2013 0522   WBC 4.2 09/06/2013 1438   RBC 4.39 09/30/2013 0522   RBC 4.10* 09/06/2013 1438   HGB 12.8* 09/30/2013 0522   HGB 12.0* 09/06/2013 1438   HCT 38.4* 09/30/2013 0522   HCT 36.9* 09/06/2013 1438   PLT 143* 09/30/2013 0522   PLT 295 09/06/2013 1438   MCV 87.5 09/30/2013 0522   MCV 90.0 09/06/2013 1438   MCH 29.2 09/30/2013 0522   MCH 29.2 09/06/2013 1438   MCHC 33.3 09/30/2013 0522   MCHC 32.5 09/06/2013 1438   RDW 13.6 09/30/2013 0522   RDW 13.1 09/06/2013 1438   LYMPHSABS 0.3* 09/30/2013 0522   LYMPHSABS 0.6* 09/06/2013 1438   MONOABS 0.2 09/30/2013 0522   MONOABS 0.6 09/06/2013 1438   EOSABS 0.0 09/30/2013 0522   EOSABS 0.1 09/06/2013 1438   BASOSABS 0.0 09/30/2013 0522   BASOSABS 0.0 09/06/2013 1438     CMP     Component Value Date/Time   NA 132* 09/30/2013 0522   NA 137 09/06/2013 1438   K 4.9 09/30/2013 0522   K 4.7 09/06/2013 1438   CL 93* 09/30/2013 0522   CO2 26 09/30/2013 0522   CO2 29 09/06/2013 1438   GLUCOSE 124* 09/30/2013 0522   GLUCOSE 77 09/06/2013 1438   BUN 24* 09/30/2013 0522   BUN 10.6 09/06/2013 1438   CREATININE 1.27 09/30/2013 0522   CREATININE 1.2 09/06/2013 1438   CALCIUM 9.4 09/30/2013 0522   CALCIUM  9.5 09/06/2013 1438   PROT 7.5 09/30/2013 0522   PROT 7.2 09/06/2013 1438   ALBUMIN 3.4* 09/30/2013 0522   ALBUMIN 3.4* 09/06/2013 1438   AST 24 09/30/2013 0522   AST 31 09/06/2013 1438   ALT 43 09/30/2013 0522   ALT 46 09/06/2013 1438   ALKPHOS 101 09/30/2013 0522   ALKPHOS 83 09/06/2013 1438   BILITOT 0.3 09/30/2013 0522   BILITOT 0.34 09/06/2013 1438   GFRNONAA 66* 09/30/2013 0522   GFRAA 77* 09/30/2013 0522     Impression:  The patient is tolerating radiotherapy.   Plan:  Continue radiotherapy as planned. Sees med/onc and [employer] tomorrow. Urged increase in H20 and nutritional shakes until then, smaller portions if better tolerated, but more frequent.  Biafine over skin. Try Baking Soda gargles for thick saliva. Patient and sign other demonstrate understanding and willingness to try. -----------------------------------  Eppie Gibson, MD

## 2013-10-08 ENCOUNTER — Ambulatory Visit (HOSPITAL_BASED_OUTPATIENT_CLINIC_OR_DEPARTMENT_OTHER): Payer: Medicaid Other

## 2013-10-08 ENCOUNTER — Ambulatory Visit: Payer: Medicaid Other | Admitting: Nutrition

## 2013-10-08 ENCOUNTER — Encounter: Payer: Self-pay | Admitting: *Deleted

## 2013-10-08 ENCOUNTER — Ambulatory Visit
Admission: RE | Admit: 2013-10-08 | Discharge: 2013-10-08 | Disposition: A | Discharge status: Home / Self Care | Payer: Medicaid Other | Source: Ambulatory Visit | Attending: Radiation Oncology | Admitting: Radiation Oncology

## 2013-10-08 ENCOUNTER — Ambulatory Visit: Payer: Self-pay | Admitting: Oncology

## 2013-10-08 VITALS — BP 140/85 | HR 99 | Temp 91.0°F | Resp 19

## 2013-10-08 DIAGNOSIS — R112 Nausea with vomiting, unspecified: Secondary | ICD-10-CM

## 2013-10-08 DIAGNOSIS — C099 Malignant neoplasm of tonsil, unspecified: Secondary | ICD-10-CM

## 2013-10-08 DIAGNOSIS — R1110 Vomiting, unspecified: Secondary | ICD-10-CM

## 2013-10-08 DIAGNOSIS — Z510 Encounter for antineoplastic radiation therapy: Secondary | ICD-10-CM | POA: Diagnosis not present

## 2013-10-08 DIAGNOSIS — E43 Unspecified severe protein-calorie malnutrition: Secondary | ICD-10-CM

## 2013-10-08 MED ORDER — ONDANSETRON 8 MG/NS 50 ML IVPB
INTRAVENOUS | Status: AC
  Filled 2013-10-08: qty 8

## 2013-10-08 MED ORDER — HEPARIN SOD (PORK) LOCK FLUSH 100 UNIT/ML IV SOLN
500.0000 [IU] | Freq: Once | INTRAVENOUS | Status: AC
  Administered 2013-10-08: 500 [IU] via INTRAVENOUS
  Filled 2013-10-08: qty 5

## 2013-10-08 MED ORDER — DEXAMETHASONE SODIUM PHOSPHATE 10 MG/ML IJ SOLN
INTRAMUSCULAR | Status: AC
  Filled 2013-10-08: qty 1

## 2013-10-08 MED ORDER — ONDANSETRON 8 MG/50ML IVPB (CHCC)
8.0000 mg | Freq: Every day | INTRAVENOUS | Status: DC | PRN
  Administered 2013-10-08: 8 mg via INTRAVENOUS

## 2013-10-08 MED ORDER — SODIUM CHLORIDE 0.9 % IV SOLN
1500.0000 mL | Freq: Every day | INTRAVENOUS | Status: DC | PRN
  Administered 2013-10-08: 1500 mL via INTRAVENOUS

## 2013-10-08 MED ORDER — LORAZEPAM 1 MG PO TABS
ORAL_TABLET | ORAL | Status: AC
  Filled 2013-10-08: qty 1

## 2013-10-08 MED ORDER — DEXAMETHASONE SODIUM PHOSPHATE 10 MG/ML IJ SOLN
10.0000 mg | Freq: Every day | INTRAMUSCULAR | Status: DC | PRN
  Administered 2013-10-08: 10 mg via INTRAVENOUS

## 2013-10-08 MED ORDER — LORAZEPAM 1 MG PO TABS
1.0000 mg | ORAL_TABLET | Freq: Every day | ORAL | Status: DC | PRN
  Administered 2013-10-08: 1 mg via ORAL

## 2013-10-08 MED ORDER — PROMETHAZINE HCL 25 MG/ML IJ SOLN
25.0000 mg | Freq: Every day | INTRAMUSCULAR | Status: DC | PRN
Start: 2013-10-08 — End: 2013-10-08
  Administered 2013-10-08: 25 mg via INTRAVENOUS
  Filled 2013-10-08: qty 1

## 2013-10-08 MED ORDER — SODIUM CHLORIDE 0.9 % IJ SOLN
10.0000 mL | INTRAMUSCULAR | Status: DC | PRN
  Administered 2013-10-08: 10 mL via INTRAVENOUS
  Filled 2013-10-08: qty 10

## 2013-10-08 MED ORDER — OSMOLITE 1.5 CAL PO LIQD: ORAL | Status: DC

## 2013-10-08 NOTE — Progress Notes (Addendum)
Patient reports he feels much better.  States he is tolerating 3-4 Osmolite 1.5 and 2 Ensure Plus daily through feeding tube. Weight documented as 285 pounds past 3 days down from 298 pounds May 21.  Denies N/V.  States bowels are moving.  Nutrition Diagnosis:  Food and Nutrition Related Knowledge Deficit continues.  Intervention:  Patient will increase TF of Osmolite 1.5 to 1 1/2 cans QID with 60 ml free water before and after bolus feedings.  If tolerated, patient to increase TF to goal of 2 cans QID.  In addition, patient will drink or flush 300 ml free water TID between bolus feedings.  Provided written information.  Patient states, "I understand, Barb."   Will write updated TF orders and notify [place].  Monitoring, Evaluation, Goals:  Patient will tolerate TF to meet estimated needs for weight maintenance.  Next Visit:  Thursday, June 25, in chemo room during IVF.

## 2013-10-08 NOTE — Patient Instructions (Signed)
Dehydration, Adult Dehydration is when you lose more fluids from the body than you take in. Vital organs like the kidneys, brain, and heart cannot function without a proper amount of fluids and salt. Any loss of fluids from the body can cause dehydration.  CAUSES   Vomiting.  Diarrhea.  Excessive sweating.  Excessive urine output.  Fever. SYMPTOMS  Mild dehydration  Thirst.  Dry lips.  Slightly dry mouth. Moderate dehydration  Very dry mouth.  Sunken eyes.  Skin does not bounce back quickly when lightly pinched and released.  Dark urine and decreased urine production.  Decreased tear production.  Headache. Severe dehydration  Very dry mouth.  Extreme thirst.  Rapid, weak pulse (more than 100 beats per minute at rest).  Cold hands and feet.  Not able to sweat in spite of heat and temperature.  Rapid breathing.  Blue lips.  Confusion and lethargy.  Difficulty being awakened.  Minimal urine production.  No tears. DIAGNOSIS  Your caregiver will diagnose dehydration based on your symptoms and your exam. Blood and urine tests will help confirm the diagnosis. The diagnostic evaluation should also identify the cause of dehydration. TREATMENT  Treatment of mild or moderate dehydration can often be done at home by increasing the amount of fluids that you drink. It is best to drink small amounts of fluid more often. Drinking too much at one time can make vomiting worse. Refer to the home care instructions below. Severe dehydration needs to be treated at the hospital where you will probably be given intravenous (IV) fluids that contain water and electrolytes. HOME CARE INSTRUCTIONS   Ask your caregiver about specific rehydration instructions.  Drink enough fluids to keep your urine clear or pale yellow.  Drink small amounts frequently if you have nausea and vomiting.  Eat as you normally do.  Avoid:  Foods or drinks high in sugar.  Carbonated  drinks.  Juice.  Extremely hot or cold fluids.  Drinks with caffeine.  Fatty, greasy foods.  Alcohol.  Tobacco.  Overeating.  Gelatin desserts.  Wash your hands well to avoid spreading bacteria and viruses.  Only take over-the-counter or prescription medicines for pain, discomfort, or fever as directed by your caregiver.  Ask your caregiver if you should continue all prescribed and over-the-counter medicines.  Keep all follow-up appointments with your caregiver. SEEK MEDICAL CARE IF:  You have abdominal pain and it increases or stays in one area (localizes).  You have a rash, stiff neck, or severe headache.  You are irritable, sleepy, or difficult to awaken.  You are weak, dizzy, or extremely thirsty. SEEK IMMEDIATE MEDICAL CARE IF:   You are unable to keep fluids down or you get worse despite treatment.  You have frequent episodes of vomiting or diarrhea.  You have blood or green matter (bile) in your vomit.  You have blood in your stool or your stool looks black and tarry.  You have not urinated in 6 to 8 hours, or you have only urinated a small amount of very dark urine.  You have a fever.  You faint. MAKE SURE YOU:   Understand these instructions.  Will watch your condition.  Will get help right away if you are not doing well or get worse. Document Released: 04/04/2005 Document Revised: 06/27/2011 Document Reviewed: 11/22/2010 ExitCare Patient Information 2015 [employer]. This information is not intended to replace advice given to you by your health care provider. Make sure you discuss any questions you have with your health care   provider.  

## 2013-10-08 NOTE — Addendum Note (Signed)
Addended by: Karie Mainland on: 10/08/2013 03:41 PM   Modules accepted: Orders

## 2013-10-09 ENCOUNTER — Encounter: Payer: Self-pay | Admitting: Radiation Oncology

## 2013-10-09 ENCOUNTER — Ambulatory Visit
Admission: RE | Admit: 2013-10-09 | Discharge: 2013-10-09 | Disposition: A | Discharge status: Home / Self Care | Payer: Medicaid Other | Source: Ambulatory Visit | Attending: Radiation Oncology | Admitting: Radiation Oncology

## 2013-10-09 ENCOUNTER — Ambulatory Visit: Payer: Medicaid Other

## 2013-10-09 ENCOUNTER — Ambulatory Visit (HOSPITAL_BASED_OUTPATIENT_CLINIC_OR_DEPARTMENT_OTHER): Payer: Medicaid Other

## 2013-10-09 VITALS — BP 142/87 | HR 97 | Temp 96.3°F | Resp 20

## 2013-10-09 DIAGNOSIS — R1110 Vomiting, unspecified: Secondary | ICD-10-CM

## 2013-10-09 DIAGNOSIS — R112 Nausea with vomiting, unspecified: Secondary | ICD-10-CM

## 2013-10-09 DIAGNOSIS — Z510 Encounter for antineoplastic radiation therapy: Secondary | ICD-10-CM | POA: Diagnosis not present

## 2013-10-09 MED ORDER — PROMETHAZINE HCL 25 MG/ML IJ SOLN
25.0000 mg | Freq: Every day | INTRAMUSCULAR | Status: DC | PRN
  Administered 2013-10-09: 25 mg via INTRAVENOUS
  Filled 2013-10-09: qty 1

## 2013-10-09 MED ORDER — ONDANSETRON 8 MG/NS 50 ML IVPB
INTRAVENOUS | Status: AC
  Filled 2013-10-09: qty 8

## 2013-10-09 MED ORDER — LORAZEPAM 1 MG PO TABS
ORAL_TABLET | ORAL | Status: AC
  Filled 2013-10-09: qty 1

## 2013-10-09 MED ORDER — SODIUM CHLORIDE 0.9 % IJ SOLN
10.0000 mL | INTRAMUSCULAR | Status: DC | PRN
  Administered 2013-10-09: 10 mL
  Filled 2013-10-09: qty 10

## 2013-10-09 MED ORDER — HEPARIN SOD (PORK) LOCK FLUSH 100 UNIT/ML IV SOLN
500.0000 [IU] | Freq: Once | INTRAVENOUS | Status: AC | PRN
  Administered 2013-10-09: 500 [IU]
  Filled 2013-10-09: qty 5

## 2013-10-09 MED ORDER — ONDANSETRON 8 MG/50ML IVPB (CHCC)
8.0000 mg | Freq: Every day | INTRAVENOUS | Status: DC | PRN
  Administered 2013-10-09: 8 mg via INTRAVENOUS

## 2013-10-09 MED ORDER — DEXAMETHASONE SODIUM PHOSPHATE 10 MG/ML IJ SOLN
10.0000 mg | Freq: Every day | INTRAMUSCULAR | Status: DC | PRN
  Administered 2013-10-09: 10 mg via INTRAVENOUS

## 2013-10-09 MED ORDER — SODIUM CHLORIDE 0.9 % IV SOLN
Freq: Every day | INTRAVENOUS | Status: DC | PRN
  Administered 2013-10-09: 13:00:00 via INTRAVENOUS

## 2013-10-09 MED ORDER — DEXAMETHASONE SODIUM PHOSPHATE 10 MG/ML IJ SOLN
INTRAMUSCULAR | Status: AC
  Filled 2013-10-09: qty 1

## 2013-10-09 MED ORDER — LORAZEPAM 1 MG PO TABS
1.0000 mg | ORAL_TABLET | Freq: Every day | ORAL | Status: DC | PRN
  Administered 2013-10-09: 1 mg via ORAL

## 2013-10-09 NOTE — Progress Notes (Signed)
Met with patient while he was in Infusion for fluids.  He reported he was doing better since last week's hospitalization.  He did not express any needs at this time.  Continuing navigation as L2 patient (treatments established).  Gayleen Orem, RN, BSN, [facility] Head [facility] Neck Oncology Navigator [phone]

## 2013-10-09 NOTE — Patient Instructions (Signed)
Dehydration, Adult Dehydration is when you lose more fluids from the body than you take in. Vital organs like the kidneys, brain, and heart cannot function without a proper amount of fluids and salt. Any loss of fluids from the body can cause dehydration.  CAUSES   Vomiting.  Diarrhea.  Excessive sweating.  Excessive urine output.  Fever. SYMPTOMS  Mild dehydration  Thirst.  Dry lips.  Slightly dry mouth. Moderate dehydration  Very dry mouth.  Sunken eyes.  Skin does not bounce back quickly when lightly pinched and released.  Dark urine and decreased urine production.  Decreased tear production.  Headache. Severe dehydration  Very dry mouth.  Extreme thirst.  Rapid, weak pulse (more than 100 beats per minute at rest).  Cold hands and feet.  Not able to sweat in spite of heat and temperature.  Rapid breathing.  Blue lips.  Confusion and lethargy.  Difficulty being awakened.  Minimal urine production.  No tears. DIAGNOSIS  Your caregiver will diagnose dehydration based on your symptoms and your exam. Blood and urine tests will help confirm the diagnosis. The diagnostic evaluation should also identify the cause of dehydration. TREATMENT  Treatment of mild or moderate dehydration can often be done at home by increasing the amount of fluids that you drink. It is best to drink small amounts of fluid more often. Drinking too much at one time can make vomiting worse. Refer to the home care instructions below. Severe dehydration needs to be treated at the hospital where you will probably be given intravenous (IV) fluids that contain water and electrolytes. HOME CARE INSTRUCTIONS   Ask your caregiver about specific rehydration instructions.  Drink enough fluids to keep your urine clear or pale yellow.  Drink small amounts frequently if you have nausea and vomiting.  Eat as you normally do.  Avoid:  Foods or drinks high in sugar.  Carbonated  drinks.  Juice.  Extremely hot or cold fluids.  Drinks with caffeine.  Fatty, greasy foods.  Alcohol.  Tobacco.  Overeating.  Gelatin desserts.  Wash your hands well to avoid spreading bacteria and viruses.  Only take over-the-counter or prescription medicines for pain, discomfort, or fever as directed by your caregiver.  Ask your caregiver if you should continue all prescribed and over-the-counter medicines.  Keep all follow-up appointments with your caregiver. SEEK MEDICAL CARE IF:  You have abdominal pain and it increases or stays in one area (localizes).  You have a rash, stiff neck, or severe headache.  You are irritable, sleepy, or difficult to awaken.  You are weak, dizzy, or extremely thirsty. SEEK IMMEDIATE MEDICAL CARE IF:   You are unable to keep fluids down or you get worse despite treatment.  You have frequent episodes of vomiting or diarrhea.  You have blood or green matter (bile) in your vomit.  You have blood in your stool or your stool looks black and tarry.  You have not urinated in 6 to 8 hours, or you have only urinated a small amount of very dark urine.  You have a fever.  You faint. MAKE SURE YOU:   Understand these instructions.  Will watch your condition.  Will get help right away if you are not doing well or get worse. Document Released: 04/04/2005 Document Revised: 06/27/2011 Document Reviewed: 11/22/2010 ExitCare Patient Information 2015 [employer]. This information is not intended to replace advice given to you by your health care provider. Make sure you discuss any questions you have with your health care   provider.  

## 2013-10-10 ENCOUNTER — Encounter: Payer: Self-pay | Admitting: Hematology and Oncology

## 2013-10-10 ENCOUNTER — Other Ambulatory Visit: Payer: Self-pay | Admitting: Medical Oncology

## 2013-10-10 ENCOUNTER — Other Ambulatory Visit: Payer: Self-pay | Admitting: *Deleted

## 2013-10-10 ENCOUNTER — Ambulatory Visit (HOSPITAL_BASED_OUTPATIENT_CLINIC_OR_DEPARTMENT_OTHER): Payer: Medicaid Other

## 2013-10-10 ENCOUNTER — Ambulatory Visit: Payer: Medicaid Other | Admitting: Nutrition

## 2013-10-10 ENCOUNTER — Ambulatory Visit
Admission: RE | Admit: 2013-10-10 | Discharge: 2013-10-10 | Disposition: A | Discharge status: Home / Self Care | Payer: Medicaid Other | Source: Ambulatory Visit | Attending: Radiation Oncology | Admitting: Radiation Oncology

## 2013-10-10 ENCOUNTER — Telehealth: Payer: Self-pay | Admitting: *Deleted

## 2013-10-10 VITALS — BP 131/88 | HR 86 | Temp 97.0°F | Resp 18

## 2013-10-10 DIAGNOSIS — F5089 Other specified eating disorder: Secondary | ICD-10-CM

## 2013-10-10 DIAGNOSIS — C099 Malignant neoplasm of tonsil, unspecified: Secondary | ICD-10-CM

## 2013-10-10 DIAGNOSIS — Z510 Encounter for antineoplastic radiation therapy: Secondary | ICD-10-CM | POA: Diagnosis not present

## 2013-10-10 DIAGNOSIS — E43 Unspecified severe protein-calorie malnutrition: Secondary | ICD-10-CM

## 2013-10-10 DIAGNOSIS — R1110 Vomiting, unspecified: Secondary | ICD-10-CM

## 2013-10-10 DIAGNOSIS — R112 Nausea with vomiting, unspecified: Secondary | ICD-10-CM

## 2013-10-10 MED ORDER — HEPARIN SOD (PORK) LOCK FLUSH 100 UNIT/ML IV SOLN
500.0000 [IU] | Freq: Once | INTRAVENOUS | Status: AC
  Administered 2013-10-10: 500 [IU] via INTRAVENOUS
  Filled 2013-10-10: qty 5

## 2013-10-10 MED ORDER — PROMETHAZINE HCL 25 MG/ML IJ SOLN
25.0000 mg | Freq: Every day | INTRAMUSCULAR | Status: DC | PRN
  Administered 2013-10-10: 25 mg via INTRAVENOUS
  Filled 2013-10-10: qty 1

## 2013-10-10 MED ORDER — SODIUM CHLORIDE 0.9 % IJ SOLN
10.0000 mL | INTRAMUSCULAR | Status: DC | PRN
  Administered 2013-10-10: 10 mL via INTRAVENOUS
  Filled 2013-10-10: qty 10

## 2013-10-10 MED ORDER — OXYCODONE HCL 15 MG PO TABS: 15.0000 mg | ORAL_TABLET | ORAL | Status: DC | PRN

## 2013-10-10 MED ORDER — SODIUM CHLORIDE 0.9 % IV SOLN
Freq: Every day | INTRAVENOUS | Status: DC | PRN
  Administered 2013-10-10: 14:00:00 via INTRAVENOUS

## 2013-10-10 NOTE — Progress Notes (Signed)
Followup completed with patient. Patient reports he feels well.  He denies nutritional side effects.  States he is tolerating 1.5 cans of Osmolite 1.5 - 3 times a day with 60 milliliters of free water before and after each bolus feeding.  Patient is trying to drink a little water by mouth but has been relatively unsuccessful.  Nutrition diagnosis: Food and nutrition related knowledge deficit continues.  Intervention: Patient educated to increase tube feedings to 1-1/2 cans 4 times a day with 60 mL free water before and after feedings.  Also educated patient to drink or flush 300 mL free water 3 times a day.  Patient verbalized understanding and willingness to try.  Teach back method used.    Monitoring, evaluation, goals: Patient will increase tube feedings to meet estimated needs for weight maintenance.  Next visit: Tuesday, June 30th, in the chemotherapy room.

## 2013-10-10 NOTE — Telephone Encounter (Signed)
Received call from [place], H&N navigator re:  Pt needs refill of Oxycodone 15 mg.  Pt receives IVFs daily.

## 2013-10-10 NOTE — Patient Instructions (Signed)
Dehydration, Adult Dehydration means your body does not have as much fluid as it needs. Your kidneys, brain, and heart will not work properly without the right amount of fluids and salt.  HOME CARE  Ask your doctor how to replace body fluid losses (rehydrate).  Drink enough fluids to keep your pee (urine) clear or pale yellow.  Drink small amounts of fluids often if you feel sick to your stomach (nauseous) or throw up (vomit).  Eat like you normally do.  Avoid:  Foods or drinks high in sugar.  Bubbly (carbonated) drinks.  Juice.  Very hot or cold fluids.  Drinks with caffeine.  Fatty, greasy foods.  Alcohol.  Tobacco.  Eating too much.  Gelatin desserts.  Wash your hands to avoid spreading germs (bacteria, viruses).  Only take medicine as told by your doctor.  Keep all doctor visits as told. GET HELP RIGHT AWAY IF:   You cannot drink something without throwing up.  You get worse even with treatment.  Your vomit has blood in it or looks greenish.  Your poop (stool) has blood in it or looks black and tarry.  You have not peed in 6 to 8 hours.  You pee a small amount of very dark pee.  You have a fever.  You pass out (faint).  You have belly (abdominal) pain that gets worse or stays in one spot (localizes).  You have a rash, stiff neck, or bad headache.  You get easily annoyed, sleepy, or are hard to wake up.  You feel weak, dizzy, or very thirsty. MAKE SURE YOU:   Understand these instructions.  Will watch your condition.  Will get help right away if you are not doing well or get worse. Document Released: 01/29/2009 Document Revised: 06/27/2011 Document Reviewed: 11/22/2010 ExitCare Patient Information 2015 [employer]. This information is not intended to replace advice given to you by your health care Mason [name]. Make sure you discuss any questions you have with your health care Mason [name].  

## 2013-10-10 NOTE — Progress Notes (Signed)
Cisplatin is not a replaceable drug

## 2013-10-11 ENCOUNTER — Ambulatory Visit (HOSPITAL_BASED_OUTPATIENT_CLINIC_OR_DEPARTMENT_OTHER): Payer: Medicaid Other

## 2013-10-11 ENCOUNTER — Ambulatory Visit: Payer: Medicaid Other

## 2013-10-11 ENCOUNTER — Ambulatory Visit
Admission: RE | Admit: 2013-10-11 | Discharge: 2013-10-11 | Disposition: A | Discharge status: Home / Self Care | Payer: Medicaid Other | Source: Ambulatory Visit | Attending: Radiation Oncology | Admitting: Radiation Oncology

## 2013-10-11 VITALS — BP 167/85 | HR 93 | Temp 97.9°F | Resp 20

## 2013-10-11 DIAGNOSIS — Z510 Encounter for antineoplastic radiation therapy: Secondary | ICD-10-CM | POA: Diagnosis not present

## 2013-10-11 DIAGNOSIS — R1110 Vomiting, unspecified: Secondary | ICD-10-CM

## 2013-10-11 DIAGNOSIS — R112 Nausea with vomiting, unspecified: Secondary | ICD-10-CM

## 2013-10-11 MED ORDER — SODIUM CHLORIDE 0.9 % IJ SOLN
10.0000 mL | INTRAMUSCULAR | Status: DC | PRN
  Administered 2013-10-11: 10 mL
  Filled 2013-10-11: qty 10

## 2013-10-11 MED ORDER — SODIUM CHLORIDE 0.9 % IV SOLN
1500.0000 mL | Freq: Every day | INTRAVENOUS | Status: DC | PRN
  Administered 2013-10-11: 14:00:00 via INTRAVENOUS

## 2013-10-11 MED ORDER — PROMETHAZINE HCL 25 MG/ML IJ SOLN
25.0000 mg | Freq: Every day | INTRAMUSCULAR | Status: DC | PRN
  Administered 2013-10-11: 25 mg via INTRAVENOUS
  Filled 2013-10-11: qty 1

## 2013-10-11 MED ORDER — HEPARIN SOD (PORK) LOCK FLUSH 100 UNIT/ML IV SOLN
500.0000 [IU] | Freq: Once | INTRAVENOUS | Status: AC | PRN
  Administered 2013-10-11: 500 [IU]
  Filled 2013-10-11: qty 5

## 2013-10-11 NOTE — Patient Instructions (Signed)
Dehydration, Adult Dehydration is when you lose more fluids from the body than you take in. Vital organs like the kidneys, brain, and heart cannot function without a proper amount of fluids and salt. Any loss of fluids from the body can cause dehydration.  CAUSES   Vomiting.  Diarrhea.  Excessive sweating.  Excessive urine output.  Fever. SYMPTOMS  Mild dehydration  Thirst.  Dry lips.  Slightly dry mouth. Moderate dehydration  Very dry mouth.  Sunken eyes.  Skin does not bounce back quickly when lightly pinched and released.  Dark urine and decreased urine production.  Decreased tear production.  Headache. Severe dehydration  Very dry mouth.  Extreme thirst.  Rapid, weak pulse (more than 100 beats per minute at rest).  Cold hands and feet.  Not able to sweat in spite of heat and temperature.  Rapid breathing.  Blue lips.  Confusion and lethargy.  Difficulty being awakened.  Minimal urine production.  No tears. DIAGNOSIS  Your caregiver will diagnose dehydration based on your symptoms and your exam. Blood and urine tests will help confirm the diagnosis. The diagnostic evaluation should also identify the cause of dehydration. TREATMENT  Treatment of mild or moderate dehydration can often be done at home by increasing the amount of fluids that you drink. It is best to drink small amounts of fluid more often. Drinking too much at one time can make vomiting worse. Refer to the home care instructions below. Severe dehydration needs to be treated at the hospital where you will probably be given intravenous (IV) fluids that contain water and electrolytes. HOME CARE INSTRUCTIONS   Ask your caregiver about specific rehydration instructions.  Drink enough fluids to keep your urine clear or pale yellow.  Drink small amounts frequently if you have nausea and vomiting.  Eat as you normally do.  Avoid:  Foods or drinks high in sugar.  Carbonated  drinks.  Juice.  Extremely hot or cold fluids.  Drinks with caffeine.  Fatty, greasy foods.  Alcohol.  Tobacco.  Overeating.  Gelatin desserts.  Wash your hands well to avoid spreading bacteria and viruses.  Only take over-the-counter or prescription medicines for pain, discomfort, or fever as directed by your caregiver.  Ask your caregiver if you should continue all prescribed and over-the-counter medicines.  Keep all follow-up appointments with your caregiver. SEEK MEDICAL CARE IF:  You have abdominal pain and it increases or stays in one area (localizes).  You have a rash, stiff neck, or severe headache.  You are irritable, sleepy, or difficult to awaken.  You are weak, dizzy, or extremely thirsty. SEEK IMMEDIATE MEDICAL CARE IF:   You are unable to keep fluids down or you get worse despite treatment.  You have frequent episodes of vomiting or diarrhea.  You have blood or green matter (bile) in your vomit.  You have blood in your stool or your stool looks black and tarry.  You have not urinated in 6 to 8 hours, or you have only urinated a small amount of very dark urine.  You have a fever.  You faint. MAKE SURE YOU:   Understand these instructions.  Will watch your condition.  Will get help right away if you are not doing well or get worse. Document Released: 04/04/2005 Document Revised: 06/27/2011 Document Reviewed: 11/22/2010 ExitCare Patient Information 2015 [employer]. This information is not intended to replace advice given to you by your health care provider. Make sure you discuss any questions you have with your health care   provider.  

## 2013-10-12 ENCOUNTER — Ambulatory Visit: Payer: Self-pay

## 2013-10-12 ENCOUNTER — Other Ambulatory Visit: Payer: Self-pay | Admitting: *Deleted

## 2013-10-12 ENCOUNTER — Other Ambulatory Visit: Payer: Self-pay | Admitting: Emergency Medicine

## 2013-10-12 NOTE — Progress Notes (Signed)
Patient did not have transportation 10/12/13. Patient states he is feeling fine. Instructed patient to stay hydrated and drink plenty of fluids. If any issues arise then contact on call md and/or go to the emergency room. Patient verbalized understanding.

## 2013-10-14 ENCOUNTER — Encounter: Payer: Self-pay | Admitting: *Deleted

## 2013-10-14 ENCOUNTER — Ambulatory Visit (HOSPITAL_BASED_OUTPATIENT_CLINIC_OR_DEPARTMENT_OTHER): Payer: Medicaid Other

## 2013-10-14 ENCOUNTER — Ambulatory Visit: Payer: Medicaid Other

## 2013-10-14 ENCOUNTER — Telehealth: Payer: Self-pay | Admitting: Hematology and Oncology

## 2013-10-14 ENCOUNTER — Other Ambulatory Visit: Payer: Self-pay | Admitting: *Deleted

## 2013-10-14 ENCOUNTER — Ambulatory Visit
Admission: RE | Admit: 2013-10-14 | Discharge: 2013-10-14 | Disposition: A | Discharge status: Home / Self Care | Payer: Medicaid Other | Source: Ambulatory Visit | Attending: Radiation Oncology | Admitting: Radiation Oncology

## 2013-10-14 ENCOUNTER — Telehealth: Payer: Self-pay | Admitting: *Deleted

## 2013-10-14 ENCOUNTER — Ambulatory Visit (HOSPITAL_BASED_OUTPATIENT_CLINIC_OR_DEPARTMENT_OTHER): Payer: Medicaid Other | Admitting: Hematology and Oncology

## 2013-10-14 ENCOUNTER — Encounter: Payer: Self-pay | Admitting: Hematology and Oncology

## 2013-10-14 VITALS — BP 149/88 | HR 88 | Temp 97.2°F | Resp 18

## 2013-10-14 VITALS — BP 133/85 | HR 85 | Temp 98.6°F | Resp 16 | Wt 289.0 lb

## 2013-10-14 VITALS — BP 149/88 | HR 88 | Temp 97.2°F | Resp 18 | Ht 79.0 in

## 2013-10-14 DIAGNOSIS — R112 Nausea with vomiting, unspecified: Secondary | ICD-10-CM

## 2013-10-14 DIAGNOSIS — E43 Unspecified severe protein-calorie malnutrition: Secondary | ICD-10-CM

## 2013-10-14 DIAGNOSIS — B977 Papillomavirus as the cause of diseases classified elsewhere: Secondary | ICD-10-CM

## 2013-10-14 DIAGNOSIS — Z510 Encounter for antineoplastic radiation therapy: Secondary | ICD-10-CM | POA: Diagnosis present

## 2013-10-14 DIAGNOSIS — D630 Anemia in neoplastic disease: Secondary | ICD-10-CM | POA: Insufficient documentation

## 2013-10-14 DIAGNOSIS — C099 Malignant neoplasm of tonsil, unspecified: Secondary | ICD-10-CM

## 2013-10-14 DIAGNOSIS — R071 Chest pain on breathing: Secondary | ICD-10-CM

## 2013-10-14 DIAGNOSIS — R059 Cough, unspecified: Secondary | ICD-10-CM | POA: Insufficient documentation

## 2013-10-14 DIAGNOSIS — D701 Agranulocytosis secondary to cancer chemotherapy: Secondary | ICD-10-CM | POA: Insufficient documentation

## 2013-10-14 DIAGNOSIS — N289 Disorder of kidney and ureter, unspecified: Secondary | ICD-10-CM | POA: Insufficient documentation

## 2013-10-14 DIAGNOSIS — R1110 Vomiting, unspecified: Secondary | ICD-10-CM

## 2013-10-14 DIAGNOSIS — N179 Acute kidney failure, unspecified: Secondary | ICD-10-CM

## 2013-10-14 DIAGNOSIS — D72819 Decreased white blood cell count, unspecified: Secondary | ICD-10-CM

## 2013-10-14 DIAGNOSIS — R0789 Other chest pain: Secondary | ICD-10-CM | POA: Insufficient documentation

## 2013-10-14 DIAGNOSIS — T451X5A Adverse effect of antineoplastic and immunosuppressive drugs, initial encounter: Secondary | ICD-10-CM

## 2013-10-14 DIAGNOSIS — R05 Cough: Secondary | ICD-10-CM

## 2013-10-14 MED ORDER — BIAFINE EX EMUL: Freq: Two times a day (BID) | CUTANEOUS | Status: DC

## 2013-10-14 MED ORDER — PROMETHAZINE HCL 25 MG/ML IJ SOLN
25.0000 mg | Freq: Every day | INTRAMUSCULAR | Status: DC | PRN
  Administered 2013-10-14: 25 mg via INTRAVENOUS
  Filled 2013-10-14: qty 1

## 2013-10-14 MED ORDER — SODIUM CHLORIDE 0.9 % IV SOLN
Freq: Every day | INTRAVENOUS | Status: DC | PRN
  Administered 2013-10-14: 15:00:00 via INTRAVENOUS

## 2013-10-14 MED ORDER — HEPARIN SOD (PORK) LOCK FLUSH 100 UNIT/ML IV SOLN
500.0000 [IU] | Freq: Once | INTRAVENOUS | Status: AC | PRN
  Administered 2013-10-14: 500 [IU]
  Filled 2013-10-14: qty 5

## 2013-10-14 MED ORDER — SODIUM CHLORIDE 0.9 % IJ SOLN
10.0000 mL | INTRAMUSCULAR | Status: DC | PRN
  Administered 2013-10-14: 10 mL
  Filled 2013-10-14: qty 10

## 2013-10-14 NOTE — Assessment & Plan Note (Signed)
Continue to feeding as tolerated.

## 2013-10-14 NOTE — Progress Notes (Signed)
Met briefly with patient prior to RT to provide support and encouragement.  Recognized he had received Rx refill for pain medication last Friday and that his final RT is tomorrow.  He did not express any needs or concerns.  Gayleen Orem, RN, BSN, [facility] Head & Neck Oncology Navigator [phone]

## 2013-10-14 NOTE — Assessment & Plan Note (Signed)
Continue aggressive supportive care. The patient will complete last treatment tomorrow. The patient will be seen on a regular basis for supportive care with plan for PET/CT scan in 4 months to assess response to treatment.Marland Kitchen

## 2013-10-14 NOTE — Progress Notes (Addendum)
He rates his pain as a 5 on a scale of 0-10.  Pt complains of Chills and Fatigue, itching. Pt has had dysphagia for both solids and liquids. The patient does not eat regular meals due to receiving feedings via tube. Pt complains of a dry mouth, tongue is dry with a moderate amount of crusty yellow exudate noted.  Pt has a moist productive cough, reporting white sputum that is occasionally blood tinged.  Pt also reports he experiences nausea and vomiting.  Pt denies SOB.  Skin over treatment area darkening, and flaking.  Pt complains of itching, but continues to apply biafine as directed.

## 2013-10-14 NOTE — Assessment & Plan Note (Signed)
This was related to cisplatin treatment. With aggressive fluid resuscitation, this has improved. Continue to monitor closely.

## 2013-10-14 NOTE — Progress Notes (Signed)
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 FNA of right level III lymph node was inconclusive for cancer   07/25/2013 Imaging PET scan showed locally advanced disease with abnormal lymphadenopathy in the right axilla   08/06/2013 Procedure CT-guided biopsy of the lymphadenopathy was negative for malignancy   08/15/2013 Surgery Patient has placement of port and feeding tube   08/19/2013 - 09/10/2013 Chemotherapy Patient received chemotherapy with cisplatin. The patient only received 2 doses to 2 uncontrolled nausea and acute renal failure.   08/19/2013 -  Radiation Therapy Patient received radiation treatment   08/27/2013 - 08/30/2013 Hospital Admission The patient was admitted to the hospital for uncontrolled nausea vomiting and dehydration.    INTERVAL HISTORY: Please see below for problem oriented charting. He has persistent cough, regular nausea vomiting. He is not compliant taking his medications. Denies any constipation.  REVIEW OF SYSTEMS:   Constitutional: Denies fevers, chills or abnormal weight loss Eyes: Denies blurriness of vision Cardiovascular: Denies palpitation, chest discomfort or lower extremity swelling Skin: Denies abnormal skin rashes Lymphatics: Denies new lymphadenopathy or  easy bruising Neurological:Denies numbness, tingling or new weaknesses Behavioral/Psych: Mood is stable, no new changes  All other systems were reviewed with the patient and are negative.  I have reviewed the past medical history, past surgical history, social history and family history with the patient and they are unchanged from previous note.  ALLERGIES:  is allergic to pollen extract.  MEDICATIONS:  Current Outpatient Prescriptions  Medication Sig Dispense Refill  . Alum & Mag Hydroxide-Simeth (MAGIC MOUTHWASH W/LIDOCAINE) SOLN Swish and swallow 10 mLs 4 (four) times daily. Take 30 minutes before meals and at bedtime.      Marland Kitchen amLODipine (NORVASC) 10 MG tablet Take 1 tablet (10 mg total) by mouth daily.  30 tablet  3  . chlorhexidine (PERIDEX) 0.12 % solution Use as directed 15 mLs in the mouth or throat 2 (two) times daily. Use for 30 seconds after breakfast and dinner. Spit out excess. Do not swallow.      . docusate sodium 100 MG CAPS Take 200 mg by mouth 2 (two) times daily.  60 capsule  1  . emollient (BIAFINE) cream Apply 1 application topically 2 (two) times daily. Apply to affected skin area afterrad and bedtime daily and on weekends daily      . fentaNYL (DURAGESIC - DOSED MCG/HR) 50 MCG/HR Place 1 patch (50 mcg total) onto the skin every 3 (three) days.  5 patch  0  . guaiFENesin (MUCINEX) 600 MG 12 hr tablet Take 2 tablets (1,200 mg total) by mouth 2 (two) times daily.  60 tablet  1  . hydrALAZINE (APRESOLINE) 50 MG tablet Take 1 tablet (50 mg total) by mouth every 8 (eight) hours.  90 tablet  1  . lactulose (Loving)  10 GM/15ML solution Take 45 mLs (30 g total) by mouth 2 (two) times daily.  500 mL  2  . lidocaine-prilocaine (EMLA) cream Apply 1 application topically as needed (For port-a-cath.).      Marland Kitchen LORazepam (ATIVAN) 1 MG tablet Take 1 tablet (1 mg total) by mouth every 8 (eight) hours.  30 tablet  1  . metoprolol (LOPRESSOR) 50 MG tablet Take 1 tablet (50 mg total) by  mouth 2 (two) times daily.  60 tablet  1  . Nutritional Supplements (FEEDING SUPPLEMENT, OSMOLITE 1.5 CAL,) LIQD Increase Osmolite 1.5 to goal of 2 cans QID via feeding tube with 60 ml free water before and after each bolus feeding.  Drink or put 300 ml free water in tube TID.  1920 mL  0  . oxyCODONE (ROXICODONE) 15 MG immediate release tablet Take 1 tablet (15 mg total) by mouth every 3 (three) hours as needed.  60 tablet  0  . polyethylene glycol (MIRALAX / GLYCOLAX) packet Take 17 g by mouth daily.  30 each  0  . prochlorperazine (COMPAZINE) 10 MG tablet Take 1 tablet (10 mg total) by mouth every 6 (six) hours as needed (Nausea or vomiting).  30 tablet  2  . sodium fluoride (FLUORISHIELD) 1.1 % GEL dental gel Place 1 drop onto teeth at bedtime. Instill one drop into each tooth space of fluoride tray. Place over teeth for 5 minutes. Remove. Spit out excess. Repeat nightly.      . sucralfate (CARAFATE) 1 G tablet Take 1 g by mouth 4 (four) times daily as needed (Dissolve one tablet in 35ml of water and swallow for sore throat.).       No current facility-administered medications for this visit.   Facility-Administered Medications Ordered in Other Visits  Medication Dose Route Frequency Provider Last Rate Last Dose  . 0.9 %  sodium chloride infusion   Intravenous Daily PRN [name], MD 750 mL/hr at 10/14/13 1439    . heparin lock flush 100 unit/mL  500 Units Intracatheter Once PRN [name], MD      . promethazine (PHENERGAN) injection 25 mg  25 mg Intravenous Daily PRN [name], MD   25 mg at 10/14/13 1439  . sodium chloride 0.9 % injection 10 mL  10 mL Intracatheter PRN [name], MD        PHYSICAL EXAMINATION: ECOG PERFORMANCE STATUS: 2 - Symptomatic, <50% confined to bed  Filed Vitals:   10/14/13 1447  BP: 149/88  Pulse: 88  Temp: 97.2 F (36.2 C)  Resp: 18   There were no vitals filed for this visit.  GENERAL:alert, no distress and comfortable SKIN: skin color, texture,  turgor are normal, no rashes or significant lesions EYES: normal, Conjunctiva are pale and non-injected, sclera clear OROPHARYNX:no exudate, no erythema and lips, buccal mucosa, and tongue normal  NECK: supple, thyroid normal size, non-tender, without nodularity LYMPH:  no palpable lymphadenopathy in the cervical, axillary or inguinal LUNGS: clear to auscultation and percussion with normal breathing effort HEART: regular rate & rhythm and no murmurs and no lower extremity edema ABDOMEN:abdomen soft, non-tender and normal bowel sounds. Feeding tube site looks okay her Musculoskeletal:no cyanosis of digits and no clubbing  NEURO: alert & oriented x 3 with fluent speech, no focal motor/sensory deficits  LABORATORY DATA:  I have reviewed the data as listed    Component Value Date/Time   NA 132* 09/30/2013 0522   NA 137 09/06/2013 1438   K 4.9 09/30/2013 0522  K 4.7 09/06/2013 1438   CL 93* 09/30/2013 0522   CO2 26 09/30/2013 0522   CO2 29 09/06/2013 1438   GLUCOSE 124* 09/30/2013 0522   GLUCOSE 77 09/06/2013 1438   BUN 24* 09/30/2013 0522   BUN 10.6 09/06/2013 1438   CREATININE 1.27 09/30/2013 0522   CREATININE 1.2 09/06/2013 1438   CALCIUM 9.4 09/30/2013 0522   CALCIUM 9.5 09/06/2013 1438   PROT 7.5 09/30/2013 0522   PROT 7.2 09/06/2013 1438   ALBUMIN 3.4* 09/30/2013 0522   ALBUMIN 3.4* 09/06/2013 1438   AST 24 09/30/2013 0522   AST 31 09/06/2013 1438   ALT 43 09/30/2013 0522   ALT 46 09/06/2013 1438   ALKPHOS 101 09/30/2013 0522   ALKPHOS 83 09/06/2013 1438   BILITOT 0.3 09/30/2013 0522   BILITOT 0.34 09/06/2013 1438   GFRNONAA 66* 09/30/2013 0522   GFRAA 77* 09/30/2013 0522    No results found for this basename: SPEP, UPEP,  kappa and lambda light chains    Lab Results  Component Value Date   WBC 2.1* 09/30/2013   NEUTROABS 1.6* 09/30/2013   HGB 12.8* 09/30/2013   HCT 38.4* 09/30/2013   MCV 87.5 09/30/2013   PLT 143* 09/30/2013      Chemistry      Component Value Date/Time   NA 132*  09/30/2013 0522   NA 137 09/06/2013 1438   K 4.9 09/30/2013 0522   K 4.7 09/06/2013 1438   CL 93* 09/30/2013 0522   CO2 26 09/30/2013 0522   CO2 29 09/06/2013 1438   BUN 24* 09/30/2013 0522   BUN 10.6 09/06/2013 1438   CREATININE 1.27 09/30/2013 0522   CREATININE 1.2 09/06/2013 1438      Component Value Date/Time   CALCIUM 9.4 09/30/2013 0522   CALCIUM 9.5 09/06/2013 1438   ALKPHOS 101 09/30/2013 0522   ALKPHOS 83 09/06/2013 1438   AST 24 09/30/2013 0522   AST 31 09/06/2013 1438   ALT 43 09/30/2013 0522   ALT 46 09/06/2013 1438   BILITOT 0.3 09/30/2013 0522   BILITOT 0.34 09/06/2013 1438     ASSESSMENT & PLAN:  Tonsillar cancer Continue aggressive supportive care. The patient will complete last treatment tomorrow. The patient will be seen on a regular basis for supportive care with plan for PET/CT scan in 4 months to assess response to treatment..  Protein-calorie malnutrition, severe Continue to feeding as tolerated.  Nausea & vomiting Continue aggressive IV fluids daily while on treatment. He will also get IV antiemetics as needed.  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 The patient denies recent history of bleeding such as epistaxis, hematuria or hematochezia. He is asymptomatic from the anemia. We will observe for now.  He does not require transfusion now.    Leukopenia due to antineoplastic chemotherapy This is likely due to recent treatment. The patient denies recent history of fevers, cough, chills, diarrhea or dysuria. He is asymptomatic from the leukopenia. I will observe for now.    Acute renal failure This was related to cisplatin treatment. With aggressive fluid resuscitation, this has improved. Continue to monitor closely.  Chest wall pain This is due to severe coughing. Continue fentanyl patch and liquid morphine as needed for pain.  Cough This is due to the thick secretion from radiation treatment. Continue conservative management with  Mucinex.   No orders of the defined types were placed in this encounter.   All questions were answered. The patient knows to call  the clinic with any problems, questions or concerns. No barriers to learning was detected. I spent 25 minutes counseling the patient face to face. The total time spent in the appointment was 30 minutes and more than 50% was on counseling and review of test results     [facility], [place], MD 10/14/2013 3:43 PM

## 2013-10-14 NOTE — Telephone Encounter (Signed)
gv adn rpnted appt sched and avs for pt for June and July.Marland KitchenMarland KitchenMarland KitchenKim to help add pt on for 2pm 7.8.15

## 2013-10-14 NOTE — Telephone Encounter (Signed)
Scheduled appt. per Doctor

## 2013-10-14 NOTE — Progress Notes (Signed)
   Weekly Management Note:  Outpatient Current Dose:  68 Gy  Projected Dose: 70 Gy    ICD-9-CM   1. Tonsillar cancer 146.0 topical emolient (BIAFINE) emulsion    Narrative:  The patient presents for routine under treatment assessment.  CBCT/MVCT images/Port film x-rays were reviewed.  The chart was checked. Pt has had dysphagia for both solids and liquids. Scheduled for IV fluids this week and next in med/onc. The patient does not eat regular meals due to receiving feedings via tube. Pt complains of a dry mouth, coughing  thick white sputum that is occasionally blood tinged. Pt also reports he experiences nausea and vomiting. Pt denies SOB. Skin over treatment area darkening, and flaking. Using Biafine inconsistently. Pt complains of itching over skin.  Physical Findings:  weight is 289 lb (131.09 kg). His oral temperature is 98.6 F (37 C). His blood pressure is 133/85 and his pulse is 85. His respiration is 16 and oxygen saturation is 98%.  NAD, in [place]. Flaking dry hyperpigmented skin over neck. Dry mucosa in mouth - no obvious thrush. + mucositis  CBC    Component Value Date/Time   WBC 2.1* 09/30/2013 0522   WBC 4.2 09/06/2013 1438   RBC 4.39 09/30/2013 0522   RBC 4.10* 09/06/2013 1438   HGB 12.8* 09/30/2013 0522   HGB 12.0* 09/06/2013 1438   HCT 38.4* 09/30/2013 0522   HCT 36.9* 09/06/2013 1438   PLT 143* 09/30/2013 0522   PLT 295 09/06/2013 1438   MCV 87.5 09/30/2013 0522   MCV 90.0 09/06/2013 1438   MCH 29.2 09/30/2013 0522   MCH 29.2 09/06/2013 1438   MCHC 33.3 09/30/2013 0522   MCHC 32.5 09/06/2013 1438   RDW 13.6 09/30/2013 0522   RDW 13.1 09/06/2013 1438   LYMPHSABS 0.3* 09/30/2013 0522   LYMPHSABS 0.6* 09/06/2013 1438   MONOABS 0.2 09/30/2013 0522   MONOABS 0.6 09/06/2013 1438   EOSABS 0.0 09/30/2013 0522   EOSABS 0.1 09/06/2013 1438   BASOSABS 0.0 09/30/2013 0522   BASOSABS 0.0 09/06/2013 1438     CMP     Component Value Date/Time   NA 132* 09/30/2013 0522   NA 137 09/06/2013 1438   K 4.9 09/30/2013 0522   K 4.7 09/06/2013 1438   CL 93* 09/30/2013 0522   CO2 26 09/30/2013 0522   CO2 29 09/06/2013 1438   GLUCOSE 124* 09/30/2013 0522   GLUCOSE 77 09/06/2013 1438   BUN 24* 09/30/2013 0522   BUN 10.6 09/06/2013 1438   CREATININE 1.27 09/30/2013 0522   CREATININE 1.2 09/06/2013 1438   CALCIUM 9.4 09/30/2013 0522   CALCIUM 9.5 09/06/2013 1438   PROT 7.5 09/30/2013 0522   PROT 7.2 09/06/2013 1438   ALBUMIN 3.4* 09/30/2013 0522   ALBUMIN 3.4* 09/06/2013 1438   AST 24 09/30/2013 0522   AST 31 09/06/2013 1438   ALT 43 09/30/2013 0522   ALT 46 09/06/2013 1438   ALKPHOS 101 09/30/2013 0522   ALKPHOS 83 09/06/2013 1438   BILITOT 0.3 09/30/2013 0522   BILITOT 0.34 09/06/2013 1438   GFRNONAA 66* 09/30/2013 0522   GFRAA 77* 09/30/2013 0522     Impression:  The patient is tolerating radiotherapy.   Plan:  Continue radiotherapy as planned. Apply Biafine at least BID.  Follow closely with med/onc for IV fluids.  F/u with me in 2 weeks  -----------------------------------  Eppie Gibson, MD

## 2013-10-14 NOTE — Assessment & Plan Note (Signed)
This is likely anemia of chronic disease. The patient denies recent history of bleeding such as epistaxis, hematuria or hematochezia. He is asymptomatic from the anemia. We will observe for now.  He does not require transfusion now.   

## 2013-10-14 NOTE — Assessment & Plan Note (Signed)
This is due to severe coughing. Continue fentanyl patch and liquid morphine as needed for pain.

## 2013-10-14 NOTE — Assessment & Plan Note (Signed)
This is likely due to recent treatment. The patient denies recent history of fevers, cough, chills, diarrhea or dysuria. He is asymptomatic from the leukopenia. I will observe for now.  

## 2013-10-14 NOTE — Patient Instructions (Signed)
Dehydration, Adult Dehydration is when you lose more fluids from the body than you take in. Vital organs like the kidneys, brain, and heart cannot function without a proper amount of fluids and salt. Any loss of fluids from the body can cause dehydration.  CAUSES   Vomiting.  Diarrhea.  Excessive sweating.  Excessive urine output.  Fever. SYMPTOMS  Mild dehydration  Thirst.  Dry lips.  Slightly dry mouth. Moderate dehydration  Very dry mouth.  Sunken eyes.  Skin does not bounce back quickly when lightly pinched and released.  Dark urine and decreased urine production.  Decreased tear production.  Headache. Severe dehydration  Very dry mouth.  Extreme thirst.  Rapid, weak pulse (more than 100 beats per minute at rest).  Cold hands and feet.  Not able to sweat in spite of heat and temperature.  Rapid breathing.  Blue lips.  Confusion and lethargy.  Difficulty being awakened.  Minimal urine production.  No tears. DIAGNOSIS  Your caregiver will diagnose dehydration based on your symptoms and your exam. Blood and urine tests will help confirm the diagnosis. The diagnostic evaluation should also identify the cause of dehydration. TREATMENT  Treatment of mild or moderate dehydration can often be done at home by increasing the amount of fluids that you drink. It is best to drink small amounts of fluid more often. Drinking too much at one time can make vomiting worse. Refer to the home care instructions below. Severe dehydration needs to be treated at the hospital where you will probably be given intravenous (IV) fluids that contain water and electrolytes. HOME CARE INSTRUCTIONS   Ask your caregiver about specific rehydration instructions.  Drink enough fluids to keep your urine clear or pale yellow.  Drink small amounts frequently if you have nausea and vomiting.  Eat as you normally do.  Avoid:  Foods or drinks high in sugar.  Carbonated  drinks.  Juice.  Extremely hot or cold fluids.  Drinks with caffeine.  Fatty, greasy foods.  Alcohol.  Tobacco.  Overeating.  Gelatin desserts.  Wash your hands well to avoid spreading bacteria and viruses.  Only take over-the-counter or prescription medicines for pain, discomfort, or fever as directed by your caregiver.  Ask your caregiver if you should continue all prescribed and over-the-counter medicines.  Keep all follow-up appointments with your caregiver. SEEK MEDICAL CARE IF:  You have abdominal pain and it increases or stays in one area (localizes).  You have a rash, stiff neck, or severe headache.  You are irritable, sleepy, or difficult to awaken.  You are weak, dizzy, or extremely thirsty. SEEK IMMEDIATE MEDICAL CARE IF:   You are unable to keep fluids down or you get worse despite treatment.  You have frequent episodes of vomiting or diarrhea.  You have blood or green matter (bile) in your vomit.  You have blood in your stool or your stool looks black and tarry.  You have not urinated in 6 to 8 hours, or you have only urinated a small amount of very dark urine.  You have a fever.  You faint. MAKE SURE YOU:   Understand these instructions.  Will watch your condition.  Will get help right away if you are not doing well or get worse. Document Released: 04/04/2005 Document Revised: 06/27/2011 Document Reviewed: 11/22/2010 ExitCare Patient Information 2015 [employer]. This information is not intended to replace advice given to you by your health care provider. Make sure you discuss any questions you have with your health care   provider.  

## 2013-10-14 NOTE — Assessment & Plan Note (Signed)
This is due to the thick secretion from radiation treatment. Continue conservative management with Mucinex.

## 2013-10-14 NOTE — Assessment & Plan Note (Signed)
Continue aggressive IV fluids daily while on treatment. He will also get IV antiemetics as needed.

## 2013-10-15 ENCOUNTER — Ambulatory Visit (HOSPITAL_BASED_OUTPATIENT_CLINIC_OR_DEPARTMENT_OTHER): Payer: Medicaid Other

## 2013-10-15 ENCOUNTER — Ambulatory Visit
Admission: RE | Admit: 2013-10-15 | Discharge: 2013-10-15 | Disposition: A | Discharge status: Home / Self Care | Payer: Medicaid Other | Source: Ambulatory Visit | Attending: Radiation Oncology | Admitting: Radiation Oncology

## 2013-10-15 ENCOUNTER — Ambulatory Visit: Payer: Medicaid Other

## 2013-10-15 ENCOUNTER — Encounter: Payer: Self-pay | Admitting: Radiation Oncology

## 2013-10-15 ENCOUNTER — Encounter: Payer: Self-pay | Admitting: *Deleted

## 2013-10-15 ENCOUNTER — Ambulatory Visit: Payer: Medicaid Other | Admitting: Nutrition

## 2013-10-15 VITALS — BP 156/91 | HR 83 | Temp 98.3°F | Resp 14

## 2013-10-15 DIAGNOSIS — R112 Nausea with vomiting, unspecified: Secondary | ICD-10-CM

## 2013-10-15 DIAGNOSIS — Z510 Encounter for antineoplastic radiation therapy: Secondary | ICD-10-CM | POA: Diagnosis not present

## 2013-10-15 DIAGNOSIS — R1110 Vomiting, unspecified: Secondary | ICD-10-CM

## 2013-10-15 DIAGNOSIS — C099 Malignant neoplasm of tonsil, unspecified: Secondary | ICD-10-CM

## 2013-10-15 MED ORDER — SODIUM CHLORIDE 0.9 % IJ SOLN
10.0000 mL | INTRAMUSCULAR | Status: DC | PRN
  Administered 2013-10-15: 10 mL
  Filled 2013-10-15: qty 10

## 2013-10-15 MED ORDER — SODIUM CHLORIDE 0.9 % IV SOLN
1500.0000 mL | Freq: Once | INTRAVENOUS | Status: AC
  Administered 2013-10-15: 1500 mL via INTRAVENOUS

## 2013-10-15 MED ORDER — HEPARIN SOD (PORK) LOCK FLUSH 100 UNIT/ML IV SOLN
500.0000 [IU] | Freq: Once | INTRAVENOUS | Status: AC | PRN
  Administered 2013-10-15: 500 [IU]
  Filled 2013-10-15: qty 5

## 2013-10-15 MED ORDER — PROMETHAZINE HCL 25 MG/ML IJ SOLN
25.0000 mg | Freq: Once | INTRAMUSCULAR | Status: AC
  Administered 2013-10-15: 25 mg via INTRAVENOUS
  Filled 2013-10-15: qty 1

## 2013-10-15 NOTE — Patient Instructions (Signed)

## 2013-10-15 NOTE — Progress Notes (Signed)
  Radiation Oncology         [phone] ________________________________  Name: Mason [name] MRN: [id number]  Date: 10/15/2013  DOB: 02-13-1966  End of Treatment Note  Diagnosis:   [id number] Right Tonsil Squamous cell carcinoma, Stage IVA  Indication for treatment:  Curative with chemotherapy       Radiation treatment dates:   08/19/2013-10/15/2013  Site/dose:   Right Tonstil and bilateral neck / 70 Gy in 35 fractions to gross disease, 63 Gy in 35 fractions to high risk nodal echelons, and 56 Gy in 35 fractions to intermediate risk nodal echelons  Beams/energy:   Helical IMRT / 6 MV photons  Narrative: The patient tolerated radiation treatment and Cisplatin with significant difficulty. He required several days of inpatient admission for supportive care related to dehydration, nausea, vomiting, severe gag reflex, thickened saliva, poor tolerance of tube feedings.  He refused treatments at times due to his symptoms and extreme gag reflex.  His course was extended by approximately a 1 week span due to these issues. Chemotherapy was held once his symptoms became severe.  Plan: The patient has completed radiation treatment. The patient will return to radiation oncology clinic for routine followup in one half month. I advised them to call or return sooner if they have any questions or concerns related to their recovery or treatment.  -----------------------------------  Eppie Gibson, MD

## 2013-10-15 NOTE — Progress Notes (Signed)
Patient currently feeling nauseous with vomiting.  States this is because he had radiation before receiving IV fluids and nausea medication.  Patient reports he is tolerating Osmolite 1.5 4 times a day with 60 mL free water before and after bolus feedings.  He is drinking water between feedings.  Weight has increased to 289 pounds on June 29 from 285 pounds June 22. Patient does not anticipate further problems with tube feedings.  Patient educated to continue current regimen.  He will contact me with further questions or concerns.

## 2013-10-16 ENCOUNTER — Other Ambulatory Visit: Payer: Self-pay | Admitting: *Deleted

## 2013-10-16 ENCOUNTER — Ambulatory Visit (HOSPITAL_BASED_OUTPATIENT_CLINIC_OR_DEPARTMENT_OTHER): Payer: Medicaid Other

## 2013-10-16 ENCOUNTER — Other Ambulatory Visit: Payer: Self-pay | Admitting: Hematology and Oncology

## 2013-10-16 ENCOUNTER — Encounter: Payer: Self-pay | Admitting: *Deleted

## 2013-10-16 VITALS — BP 159/99 | HR 95 | Temp 98.5°F | Resp 20

## 2013-10-16 DIAGNOSIS — R112 Nausea with vomiting, unspecified: Secondary | ICD-10-CM

## 2013-10-16 DIAGNOSIS — C099 Malignant neoplasm of tonsil, unspecified: Secondary | ICD-10-CM

## 2013-10-16 DIAGNOSIS — R1110 Vomiting, unspecified: Secondary | ICD-10-CM

## 2013-10-16 DIAGNOSIS — R0789 Other chest pain: Secondary | ICD-10-CM

## 2013-10-16 DIAGNOSIS — E43 Unspecified severe protein-calorie malnutrition: Secondary | ICD-10-CM

## 2013-10-16 MED ORDER — OXYCODONE HCL 15 MG PO TABS: 15.0000 mg | ORAL_TABLET | ORAL | Status: DC | PRN

## 2013-10-16 MED ORDER — HEPARIN SOD (PORK) LOCK FLUSH 100 UNIT/ML IV SOLN
250.0000 [IU] | Freq: Once | INTRAVENOUS | Status: DC | PRN
Start: 2013-10-16 — End: 2013-10-16
  Filled 2013-10-16: qty 5

## 2013-10-16 MED ORDER — HEPARIN SOD (PORK) LOCK FLUSH 100 UNIT/ML IV SOLN
500.0000 [IU] | Freq: Once | INTRAVENOUS | Status: AC | PRN
  Administered 2013-10-16: 500 [IU]
  Filled 2013-10-16: qty 5

## 2013-10-16 MED ORDER — DEXAMETHASONE SODIUM PHOSPHATE 10 MG/ML IJ SOLN: 10.0000 mg | Freq: Every day | INTRAMUSCULAR | Status: DC | PRN

## 2013-10-16 MED ORDER — PROMETHAZINE HCL 25 MG/ML IJ SOLN
25.0000 mg | Freq: Every day | INTRAMUSCULAR | Status: DC | PRN
  Administered 2013-10-16: 25 mg via INTRAVENOUS
  Filled 2013-10-16: qty 1

## 2013-10-16 MED ORDER — SODIUM CHLORIDE 0.9 % IJ SOLN
10.0000 mL | INTRAMUSCULAR | Status: DC | PRN
  Administered 2013-10-16: 10 mL
  Filled 2013-10-16: qty 10

## 2013-10-16 MED ORDER — LORAZEPAM 1 MG PO TABS: 1.0000 mg | ORAL_TABLET | Freq: Every day | ORAL | Status: DC | PRN

## 2013-10-16 MED ORDER — SODIUM CHLORIDE 0.9 % IV SOLN
1500.0000 mL | Freq: Every day | INTRAVENOUS | Status: DC | PRN
  Administered 2013-10-16: 1000 mL via INTRAVENOUS

## 2013-10-16 MED ORDER — ONDANSETRON 8 MG/50ML IVPB (CHCC): 8.0000 mg | Freq: Every day | INTRAVENOUS | Status: DC | PRN

## 2013-10-16 MED ORDER — ALTEPLASE 2 MG IJ SOLR
2.0000 mg | Freq: Once | INTRAMUSCULAR | Status: DC | PRN
  Filled 2013-10-16: qty 2

## 2013-10-16 NOTE — Progress Notes (Signed)
Met with pt during final RT to offer support and to celebrate end of radiation treatment.  I explained that my role as navigator will continue for several more months and that I will be calling and/or joining him during follow-up visits.  Pt expressed appreciation.  Initiating navigation as L3 patient (treatments completed) with this encounter.  Gayleen Orem, RN, BSN, [facility] Navigator [phone]

## 2013-10-16 NOTE — Progress Notes (Signed)
Entry error

## 2013-10-16 NOTE — Patient Instructions (Signed)
Dehydration, Adult Dehydration is when you lose more fluids from the body than you take in. Vital organs like the kidneys, brain, and heart cannot function without a proper amount of fluids and salt. Any loss of fluids from the body can cause dehydration.  CAUSES   Vomiting.  Diarrhea.  Excessive sweating.  Excessive urine output.  Fever. SYMPTOMS  Mild dehydration  Thirst.  Dry lips.  Slightly dry mouth. Moderate dehydration  Very dry mouth.  Sunken eyes.  Skin does not bounce back quickly when lightly pinched and released.  Dark urine and decreased urine production.  Decreased tear production.  Headache. Severe dehydration  Very dry mouth.  Extreme thirst.  Rapid, weak pulse (more than 100 beats per minute at rest).  Cold hands and feet.  Not able to sweat in spite of heat and temperature.  Rapid breathing.  Blue lips.  Confusion and lethargy.  Difficulty being awakened.  Minimal urine production.  No tears. DIAGNOSIS  Your caregiver will diagnose dehydration based on your symptoms and your exam. Blood and urine tests will help confirm the diagnosis. The diagnostic evaluation should also identify the cause of dehydration. TREATMENT  Treatment of mild or moderate dehydration can often be done at home by increasing the amount of fluids that you drink. It is best to drink small amounts of fluid more often. Drinking too much at one time can make vomiting worse. Refer to the home care instructions below. Severe dehydration needs to be treated at the hospital where you will probably be given intravenous (IV) fluids that contain water and electrolytes. HOME CARE INSTRUCTIONS   Ask your caregiver about specific rehydration instructions.  Drink enough fluids to keep your urine clear or pale yellow.  Drink small amounts frequently if you have nausea and vomiting.  Eat as you normally do.  Avoid:  Foods or drinks high in sugar.  Carbonated  drinks.  Juice.  Extremely hot or cold fluids.  Drinks with caffeine.  Fatty, greasy foods.  Alcohol.  Tobacco.  Overeating.  Gelatin desserts.  Wash your hands well to avoid spreading bacteria and viruses.  Only take over-the-counter or prescription medicines for pain, discomfort, or fever as directed by your caregiver.  Ask your caregiver if you should continue all prescribed and over-the-counter medicines.  Keep all follow-up appointments with your caregiver. SEEK MEDICAL CARE IF:  You have abdominal pain and it increases or stays in one area (localizes).  You have a rash, stiff neck, or severe headache.  You are irritable, sleepy, or difficult to awaken.  You are weak, dizzy, or extremely thirsty. SEEK IMMEDIATE MEDICAL CARE IF:   You are unable to keep fluids down or you get worse despite treatment.  You have frequent episodes of vomiting or diarrhea.  You have blood or green matter (bile) in your vomit.  You have blood in your stool or your stool looks black and tarry.  You have not urinated in 6 to 8 hours, or you have only urinated a small amount of very dark urine.  You have a fever.  You faint. MAKE SURE YOU:   Understand these instructions.  Will watch your condition.  Will get help right away if you are not doing well or get worse. Document Released: 04/04/2005 Document Revised: 06/27/2011 Document Reviewed: 11/22/2010 ExitCare Patient Information 2015 [employer]. This information is not intended to replace advice given to you by your health care provider. Make sure you discuss any questions you have with your health care   provider.  

## 2013-10-17 ENCOUNTER — Ambulatory Visit (HOSPITAL_BASED_OUTPATIENT_CLINIC_OR_DEPARTMENT_OTHER): Payer: Medicaid Other

## 2013-10-17 VITALS — BP 167/91 | HR 95 | Temp 98.8°F | Resp 20

## 2013-10-17 DIAGNOSIS — R1110 Vomiting, unspecified: Secondary | ICD-10-CM

## 2013-10-17 DIAGNOSIS — R112 Nausea with vomiting, unspecified: Secondary | ICD-10-CM

## 2013-10-17 DIAGNOSIS — R1115 Cyclical vomiting syndrome unrelated to migraine: Secondary | ICD-10-CM

## 2013-10-17 DIAGNOSIS — N179 Acute kidney failure, unspecified: Secondary | ICD-10-CM

## 2013-10-17 MED ORDER — SODIUM CHLORIDE 0.9 % IV SOLN
Freq: Every day | INTRAVENOUS | Status: DC | PRN
  Administered 2013-10-17 (×2): via INTRAVENOUS

## 2013-10-17 MED ORDER — PROMETHAZINE HCL 25 MG/ML IJ SOLN
25.0000 mg | Freq: Every day | INTRAMUSCULAR | Status: DC | PRN
  Administered 2013-10-17: 25 mg via INTRAVENOUS
  Filled 2013-10-17: qty 1

## 2013-10-17 MED ORDER — DEXAMETHASONE SODIUM PHOSPHATE 10 MG/ML IJ SOLN: 10.0000 mg | Freq: Every day | INTRAMUSCULAR | Status: DC | PRN

## 2013-10-17 MED ORDER — HEPARIN SOD (PORK) LOCK FLUSH 100 UNIT/ML IV SOLN
500.0000 [IU] | Freq: Once | INTRAVENOUS | Status: AC | PRN
  Administered 2013-10-17: 500 [IU]
  Filled 2013-10-17: qty 5

## 2013-10-17 MED ORDER — LORAZEPAM 1 MG PO TABS: 1.0000 mg | ORAL_TABLET | Freq: Every day | ORAL | Status: DC | PRN

## 2013-10-17 MED ORDER — ONDANSETRON 8 MG/50ML IVPB (CHCC): 8.0000 mg | Freq: Every day | INTRAVENOUS | Status: DC | PRN

## 2013-10-17 MED ORDER — SODIUM CHLORIDE 0.9 % IJ SOLN
10.0000 mL | INTRAMUSCULAR | Status: DC | PRN
  Administered 2013-10-17: 10 mL
  Filled 2013-10-17: qty 10

## 2013-10-17 NOTE — Patient Instructions (Signed)
Dehydration, Adult Dehydration is when you lose more fluids from the body than you take in. Vital organs like the kidneys, brain, and heart cannot function without a proper amount of fluids and salt. Any loss of fluids from the body can cause dehydration.  CAUSES   Vomiting.  Diarrhea.  Excessive sweating.  Excessive urine output.  Fever. SYMPTOMS  Mild dehydration  Thirst.  Dry lips.  Slightly dry mouth. Moderate dehydration  Very dry mouth.  Sunken eyes.  Skin does not bounce back quickly when lightly pinched and released.  Dark urine and decreased urine production.  Decreased tear production.  Headache. Severe dehydration  Very dry mouth.  Extreme thirst.  Rapid, weak pulse (more than 100 beats per minute at rest).  Cold hands and feet.  Not able to sweat in spite of heat and temperature.  Rapid breathing.  Blue lips.  Confusion and lethargy.  Difficulty being awakened.  Minimal urine production.  No tears. DIAGNOSIS  Your caregiver will diagnose dehydration based on your symptoms and your exam. Blood and urine tests will help confirm the diagnosis. The diagnostic evaluation should also identify the cause of dehydration. TREATMENT  Treatment of mild or moderate dehydration can often be done at home by increasing the amount of fluids that you drink. It is best to drink small amounts of fluid more often. Drinking too much at one time can make vomiting worse. Refer to the home care instructions below. Severe dehydration needs to be treated at the hospital where you will probably be given intravenous (IV) fluids that contain water and electrolytes. HOME CARE INSTRUCTIONS   Ask your caregiver about specific rehydration instructions.  Drink enough fluids to keep your urine clear or pale yellow.  Drink small amounts frequently if you have nausea and vomiting.  Eat as you normally do.  Avoid:  Foods or drinks high in sugar.  Carbonated  drinks.  Juice.  Extremely hot or cold fluids.  Drinks with caffeine.  Fatty, greasy foods.  Alcohol.  Tobacco.  Overeating.  Gelatin desserts.  Wash your hands well to avoid spreading bacteria and viruses.  Only take over-the-counter or prescription medicines for pain, discomfort, or fever as directed by your caregiver.  Ask your caregiver if you should continue all prescribed and over-the-counter medicines.  Keep all follow-up appointments with your caregiver. SEEK MEDICAL CARE IF:  You have abdominal pain and it increases or stays in one area (localizes).  You have a rash, stiff neck, or severe headache.  You are irritable, sleepy, or difficult to awaken.  You are weak, dizzy, or extremely thirsty. SEEK IMMEDIATE MEDICAL CARE IF:   You are unable to keep fluids down or you get worse despite treatment.  You have frequent episodes of vomiting or diarrhea.  You have blood or green matter (bile) in your vomit.  You have blood in your stool or your stool looks black and tarry.  You have not urinated in 6 to 8 hours, or you have only urinated a small amount of very dark urine.  You have a fever.  You faint. MAKE SURE YOU:   Understand these instructions.  Will watch your condition.  Will get help right away if you are not doing well or get worse. Document Released: 04/04/2005 Document Revised: 06/27/2011 Document Reviewed: 11/22/2010 ExitCare Patient Information 2015 [employer]. This information is not intended to replace advice given to you by your health care provider. Make sure you discuss any questions you have with your health care   provider.  

## 2013-10-21 ENCOUNTER — Other Ambulatory Visit: Payer: Self-pay | Admitting: *Deleted

## 2013-10-21 ENCOUNTER — Ambulatory Visit (HOSPITAL_BASED_OUTPATIENT_CLINIC_OR_DEPARTMENT_OTHER): Payer: Medicaid Other

## 2013-10-21 ENCOUNTER — Telehealth: Payer: Self-pay | Admitting: *Deleted

## 2013-10-21 VITALS — BP 142/95 | HR 92 | Temp 98.0°F

## 2013-10-21 DIAGNOSIS — R1110 Vomiting, unspecified: Secondary | ICD-10-CM

## 2013-10-21 DIAGNOSIS — C099 Malignant neoplasm of tonsil, unspecified: Secondary | ICD-10-CM

## 2013-10-21 DIAGNOSIS — R112 Nausea with vomiting, unspecified: Secondary | ICD-10-CM

## 2013-10-21 MED ORDER — HEPARIN SOD (PORK) LOCK FLUSH 100 UNIT/ML IV SOLN
500.0000 [IU] | Freq: Once | INTRAVENOUS | Status: AC | PRN
  Administered 2013-10-21: 500 [IU]
  Filled 2013-10-21: qty 5

## 2013-10-21 MED ORDER — PROMETHAZINE HCL 25 MG/ML IJ SOLN
25.0000 mg | Freq: Every day | INTRAMUSCULAR | Status: DC | PRN
  Administered 2013-10-21: 25 mg via INTRAVENOUS
  Filled 2013-10-21: qty 1

## 2013-10-21 MED ORDER — SODIUM CHLORIDE 0.9 % IV SOLN
Freq: Every day | INTRAVENOUS | Status: DC | PRN
  Administered 2013-10-21: 09:00:00 via INTRAVENOUS

## 2013-10-21 MED ORDER — SODIUM CHLORIDE 0.9 % IJ SOLN
10.0000 mL | INTRAMUSCULAR | Status: DC | PRN
  Administered 2013-10-21: 10 mL
  Filled 2013-10-21: qty 10

## 2013-10-21 MED ORDER — ALTEPLASE 2 MG IJ SOLR
2.0000 mg | Freq: Once | INTRAMUSCULAR | Status: DC | PRN
  Filled 2013-10-21: qty 2

## 2013-10-21 MED ORDER — HEPARIN SOD (PORK) LOCK FLUSH 100 UNIT/ML IV SOLN
250.0000 [IU] | Freq: Once | INTRAVENOUS | Status: DC | PRN
  Filled 2013-10-21: qty 5

## 2013-10-21 NOTE — Patient Instructions (Signed)
Dehydration, Adult Dehydration is when you lose more fluids from the body than you take in. Vital organs like the kidneys, brain, and heart cannot function without a proper amount of fluids and salt. Any loss of fluids from the body can cause dehydration.  CAUSES   Vomiting.  Diarrhea.  Excessive sweating.  Excessive urine output.  Fever. SYMPTOMS  Mild dehydration  Thirst.  Dry lips.  Slightly dry mouth. Moderate dehydration  Very dry mouth.  Sunken eyes.  Skin does not bounce back quickly when lightly pinched and released.  Dark urine and decreased urine production.  Decreased tear production.  Headache. Severe dehydration  Very dry mouth.  Extreme thirst.  Rapid, weak pulse (more than 100 beats per minute at rest).  Cold hands and feet.  Not able to sweat in spite of heat and temperature.  Rapid breathing.  Blue lips.  Confusion and lethargy.  Difficulty being awakened.  Minimal urine production.  No tears. DIAGNOSIS  Your caregiver will diagnose dehydration based on your symptoms and your exam. Blood and urine tests will help confirm the diagnosis. The diagnostic evaluation should also identify the cause of dehydration. TREATMENT  Treatment of mild or moderate dehydration can often be done at home by increasing the amount of fluids that you drink. It is best to drink small amounts of fluid more often. Drinking too much at one time can make vomiting worse. Refer to the home care instructions below. Severe dehydration needs to be treated at the hospital where you will probably be given intravenous (IV) fluids that contain water and electrolytes. HOME CARE INSTRUCTIONS   Ask your caregiver about specific rehydration instructions.  Drink enough fluids to keep your urine clear or pale yellow.  Drink small amounts frequently if you have nausea and vomiting.  Eat as you normally do.  Avoid:  Foods or drinks high in sugar.  Carbonated  drinks.  Juice.  Extremely hot or cold fluids.  Drinks with caffeine.  Fatty, greasy foods.  Alcohol.  Tobacco.  Overeating.  Gelatin desserts.  Wash your hands well to avoid spreading bacteria and viruses.  Only take over-the-counter or prescription medicines for pain, discomfort, or fever as directed by your caregiver.  Ask your caregiver if you should continue all prescribed and over-the-counter medicines.  Keep all follow-up appointments with your caregiver. SEEK MEDICAL CARE IF:  You have abdominal pain and it increases or stays in one area (localizes).  You have a rash, stiff neck, or severe headache.  You are irritable, sleepy, or difficult to awaken.  You are weak, dizzy, or extremely thirsty. SEEK IMMEDIATE MEDICAL CARE IF:   You are unable to keep fluids down or you get worse despite treatment.  You have frequent episodes of vomiting or diarrhea.  You have blood or green matter (bile) in your vomit.  You have blood in your stool or your stool looks black and tarry.  You have not urinated in 6 to 8 hours, or you have only urinated a small amount of very dark urine.  You have a fever.  You faint. MAKE SURE YOU:   Understand these instructions.  Will watch your condition.  Will get help right away if you are not doing well or get worse. Document Released: 04/04/2005 Document Revised: 06/27/2011 Document Reviewed: 11/22/2010 ExitCare Patient Information 2015 [employer]. This information is not intended to replace advice given to you by your health care provider. Make sure you discuss any questions you have with your health care   provider.  

## 2013-10-21 NOTE — Telephone Encounter (Signed)
Per patient I have moved appts to the afternoon.

## 2013-10-22 ENCOUNTER — Ambulatory Visit: Payer: Self-pay | Admitting: Hematology and Oncology

## 2013-10-22 ENCOUNTER — Other Ambulatory Visit: Payer: Self-pay | Admitting: *Deleted

## 2013-10-22 ENCOUNTER — Ambulatory Visit (HOSPITAL_BASED_OUTPATIENT_CLINIC_OR_DEPARTMENT_OTHER): Payer: Medicaid Other

## 2013-10-22 VITALS — BP 147/88 | HR 72 | Temp 98.5°F

## 2013-10-22 DIAGNOSIS — R112 Nausea with vomiting, unspecified: Secondary | ICD-10-CM

## 2013-10-22 DIAGNOSIS — R1110 Vomiting, unspecified: Secondary | ICD-10-CM

## 2013-10-22 MED ORDER — SODIUM CHLORIDE 0.9 % IJ SOLN
10.0000 mL | INTRAMUSCULAR | Status: DC | PRN
  Administered 2013-10-22: 10 mL
  Filled 2013-10-22: qty 10

## 2013-10-22 MED ORDER — SODIUM CHLORIDE 0.9 % IV SOLN
Freq: Every day | INTRAVENOUS | Status: DC | PRN
  Administered 2013-10-22: 16:00:00 via INTRAVENOUS

## 2013-10-22 MED ORDER — HEPARIN SOD (PORK) LOCK FLUSH 100 UNIT/ML IV SOLN
500.0000 [IU] | Freq: Once | INTRAVENOUS | Status: AC | PRN
  Administered 2013-10-22: 500 [IU]
  Filled 2013-10-22: qty 5

## 2013-10-22 MED ORDER — PROMETHAZINE HCL 25 MG/ML IJ SOLN
25.0000 mg | Freq: Every day | INTRAMUSCULAR | Status: DC | PRN
  Administered 2013-10-22: 25 mg via INTRAVENOUS
  Filled 2013-10-22: qty 1

## 2013-10-22 NOTE — Patient Instructions (Signed)
Dehydration, Adult Dehydration is when you lose more fluids from the body than you take in. Vital organs like the kidneys, brain, and heart cannot function without a proper amount of fluids and salt. Any loss of fluids from the body can cause dehydration.  CAUSES   Vomiting.  Diarrhea.  Excessive sweating.  Excessive urine output.  Fever. SYMPTOMS  Mild dehydration  Thirst.  Dry lips.  Slightly dry mouth. Moderate dehydration  Very dry mouth.  Sunken eyes.  Skin does not bounce back quickly when lightly pinched and released.  Dark urine and decreased urine production.  Decreased tear production.  Headache. Severe dehydration  Very dry mouth.  Extreme thirst.  Rapid, weak pulse (more than 100 beats per minute at rest).  Cold hands and feet.  Not able to sweat in spite of heat and temperature.  Rapid breathing.  Blue lips.  Confusion and lethargy.  Difficulty being awakened.  Minimal urine production.  No tears. DIAGNOSIS  Your caregiver will diagnose dehydration based on your symptoms and your exam. Blood and urine tests will help confirm the diagnosis. The diagnostic evaluation should also identify the cause of dehydration. TREATMENT  Treatment of mild or moderate dehydration can often be done at home by increasing the amount of fluids that you drink. It is best to drink small amounts of fluid more often. Drinking too much at one time can make vomiting worse. Refer to the home care instructions below. Severe dehydration needs to be treated at the hospital where you will probably be given intravenous (IV) fluids that contain water and electrolytes. HOME CARE INSTRUCTIONS   Ask your caregiver about specific rehydration instructions.  Drink enough fluids to keep your urine clear or pale yellow.  Drink small amounts frequently if you have nausea and vomiting.  Eat as you normally do.  Avoid:  Foods or drinks high in sugar.  Carbonated  drinks.  Juice.  Extremely hot or cold fluids.  Drinks with caffeine.  Fatty, greasy foods.  Alcohol.  Tobacco.  Overeating.  Gelatin desserts.  Wash your hands well to avoid spreading bacteria and viruses.  Only take over-the-counter or prescription medicines for pain, discomfort, or fever as directed by your caregiver.  Ask your caregiver if you should continue all prescribed and over-the-counter medicines.  Keep all follow-up appointments with your caregiver. SEEK MEDICAL CARE IF:  You have abdominal pain and it increases or stays in one area (localizes).  You have a rash, stiff neck, or severe headache.  You are irritable, sleepy, or difficult to awaken.  You are weak, dizzy, or extremely thirsty. SEEK IMMEDIATE MEDICAL CARE IF:   You are unable to keep fluids down or you get worse despite treatment.  You have frequent episodes of vomiting or diarrhea.  You have blood or green matter (bile) in your vomit.  You have blood in your stool or your stool looks black and tarry.  You have not urinated in 6 to 8 hours, or you have only urinated a small amount of very dark urine.  You have a fever.  You faint. MAKE SURE YOU:   Understand these instructions.  Will watch your condition.  Will get help right away if you are not doing well or get worse. Document Released: 04/04/2005 Document Revised: 06/27/2011 Document Reviewed: 11/22/2010 ExitCare Patient Information 2015 [employer]. This information is not intended to replace advice given to you by your health care provider. Make sure you discuss any questions you have with your health care   provider.  

## 2013-10-22 NOTE — Progress Notes (Signed)
Patient discharged in no acute distress, ambulatory with fiance. Patient is to take BP medication when he gets home.

## 2013-10-23 ENCOUNTER — Encounter: Payer: Self-pay | Admitting: Hematology and Oncology

## 2013-10-23 ENCOUNTER — Telehealth: Payer: Self-pay | Admitting: Hematology and Oncology

## 2013-10-23 ENCOUNTER — Ambulatory Visit (HOSPITAL_BASED_OUTPATIENT_CLINIC_OR_DEPARTMENT_OTHER): Payer: Medicaid Other

## 2013-10-23 ENCOUNTER — Ambulatory Visit: Payer: Medicaid Other

## 2013-10-23 ENCOUNTER — Other Ambulatory Visit: Payer: Self-pay | Admitting: *Deleted

## 2013-10-23 ENCOUNTER — Other Ambulatory Visit (HOSPITAL_BASED_OUTPATIENT_CLINIC_OR_DEPARTMENT_OTHER): Payer: Medicaid Other

## 2013-10-23 ENCOUNTER — Ambulatory Visit (HOSPITAL_BASED_OUTPATIENT_CLINIC_OR_DEPARTMENT_OTHER): Payer: Medicaid Other | Admitting: Hematology and Oncology

## 2013-10-23 VITALS — BP 139/88 | HR 80 | Temp 97.8°F | Resp 18 | Ht 79.0 in | Wt 266.8 lb

## 2013-10-23 DIAGNOSIS — E860 Dehydration: Secondary | ICD-10-CM

## 2013-10-23 DIAGNOSIS — R112 Nausea with vomiting, unspecified: Secondary | ICD-10-CM

## 2013-10-23 DIAGNOSIS — R0789 Other chest pain: Secondary | ICD-10-CM

## 2013-10-23 DIAGNOSIS — Z95828 Presence of other vascular implants and grafts: Secondary | ICD-10-CM

## 2013-10-23 DIAGNOSIS — R071 Chest pain on breathing: Secondary | ICD-10-CM

## 2013-10-23 DIAGNOSIS — N179 Acute kidney failure, unspecified: Secondary | ICD-10-CM

## 2013-10-23 DIAGNOSIS — D630 Anemia in neoplastic disease: Secondary | ICD-10-CM

## 2013-10-23 DIAGNOSIS — R05 Cough: Secondary | ICD-10-CM

## 2013-10-23 DIAGNOSIS — C099 Malignant neoplasm of tonsil, unspecified: Secondary | ICD-10-CM

## 2013-10-23 DIAGNOSIS — E43 Unspecified severe protein-calorie malnutrition: Secondary | ICD-10-CM

## 2013-10-23 DIAGNOSIS — R1110 Vomiting, unspecified: Secondary | ICD-10-CM

## 2013-10-23 DIAGNOSIS — R059 Cough, unspecified: Secondary | ICD-10-CM

## 2013-10-23 LAB — COMPREHENSIVE METABOLIC PANEL (CC13)
ALBUMIN: 3.4 g/dL — AB (ref 3.5–5.0)
ALT: 29 U/L (ref 0–55)
ANION GAP: 9 meq/L (ref 3–11)
AST: 24 U/L (ref 5–34)
Alkaline Phosphatase: 85 U/L (ref 40–150)
BUN: 12.1 mg/dL (ref 7.0–26.0)
CO2: 26 meq/L (ref 22–29)
Calcium: 9.4 mg/dL (ref 8.4–10.4)
Chloride: 102 mEq/L (ref 98–109)
Creatinine: 1.2 mg/dL (ref 0.7–1.3)
GLUCOSE: 83 mg/dL (ref 70–140)
POTASSIUM: 3.9 meq/L (ref 3.5–5.1)
Sodium: 137 mEq/L (ref 136–145)
TOTAL PROTEIN: 6.7 g/dL (ref 6.4–8.3)
Total Bilirubin: 0.41 mg/dL (ref 0.20–1.20)

## 2013-10-23 LAB — CBC WITH DIFFERENTIAL/PLATELET
BASO%: 0.3 % (ref 0.0–2.0)
Basophils Absolute: 0 10*3/uL (ref 0.0–0.1)
EOS%: 2 % (ref 0.0–7.0)
Eosinophils Absolute: 0.1 10*3/uL (ref 0.0–0.5)
HCT: 33.3 % — ABNORMAL LOW (ref 38.4–49.9)
HGB: 10.8 g/dL — ABNORMAL LOW (ref 13.0–17.1)
LYMPH%: 3.6 % — ABNORMAL LOW (ref 14.0–49.0)
MCH: 29.3 pg (ref 27.2–33.4)
MCHC: 32.5 g/dL (ref 32.0–36.0)
MCV: 90.2 fL (ref 79.3–98.0)
MONO#: 0.7 10*3/uL (ref 0.1–0.9)
MONO%: 12 % (ref 0.0–14.0)
NEUT#: 5.1 10*3/uL (ref 1.5–6.5)
NEUT%: 82.1 % — ABNORMAL HIGH (ref 39.0–75.0)
PLATELETS: 269 10*3/uL (ref 140–400)
RBC: 3.69 10*6/uL — ABNORMAL LOW (ref 4.20–5.82)
RDW: 15.3 % — ABNORMAL HIGH (ref 11.0–14.6)
WBC: 6.2 10*3/uL (ref 4.0–10.3)
lymph#: 0.2 10*3/uL — ABNORMAL LOW (ref 0.9–3.3)

## 2013-10-23 MED ORDER — SODIUM CHLORIDE 0.9 % IJ SOLN
10.0000 mL | INTRAMUSCULAR | Status: DC | PRN
  Administered 2013-10-23: 10 mL via INTRAVENOUS
  Filled 2013-10-23: qty 10

## 2013-10-23 MED ORDER — PROMETHAZINE HCL 25 MG/ML IJ SOLN
25.0000 mg | Freq: Every day | INTRAMUSCULAR | Status: DC | PRN
  Administered 2013-10-23: 25 mg via INTRAVENOUS
  Filled 2013-10-23: qty 1

## 2013-10-23 MED ORDER — OXYCODONE HCL 15 MG PO TABS: 15.0000 mg | ORAL_TABLET | ORAL | Status: DC | PRN

## 2013-10-23 MED ORDER — SODIUM CHLORIDE 0.9 % IV SOLN
Freq: Every day | INTRAVENOUS | Status: DC | PRN
  Administered 2013-10-23: 14:00:00 via INTRAVENOUS

## 2013-10-23 NOTE — Assessment & Plan Note (Signed)
Continue to feeding as tolerated. He needs to use 8 cans of nutritional supplements daily

## 2013-10-23 NOTE — Assessment & Plan Note (Signed)
This is due to the thick secretion from radiation treatment. Continue conservative management with Mucinex.

## 2013-10-23 NOTE — Assessment & Plan Note (Signed)
This was related to cisplatin treatment and severe dehydration With aggressive fluid resuscitation, this has improved. Continue to monitor closely and he will continue daily IVF until he can maintain adequate intake via his feeding tube

## 2013-10-23 NOTE — Assessment & Plan Note (Signed)
Continue aggressive supportive care. The patient will be seen on a regular basis for supportive care with plan for PET/CT scan in 4 months to assess response to treatment.

## 2013-10-23 NOTE — Telephone Encounter (Signed)
gv adn rpinted appt sched and avs for pt for July....sed added tx. °

## 2013-10-23 NOTE — Patient Instructions (Signed)

## 2013-10-23 NOTE — Progress Notes (Signed)
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 FNA of right level III lymph node was inconclusive for cancer   07/25/2013 Imaging PET scan showed locally advanced disease with abnormal lymphadenopathy in the right axilla   08/06/2013 Procedure CT-guided biopsy of the lymphadenopathy was negative for malignancy   08/15/2013 Surgery Patient has placement of port and feeding tube   08/19/2013 - 09/10/2013 Chemotherapy Patient received chemotherapy with cisplatin. The patient only received 2 doses due to uncontrolled nausea and acute renal failure.   08/19/2013 - 10/15/2013 Radiation Therapy Patient received radiation treatment   08/27/2013 - 08/30/2013 Hospital Admission The patient was admitted to the hospital for uncontrolled nausea vomiting and dehydration.    INTERVAL HISTORY: Please see below for problem oriented charting. The patient is non-compliant with treatment recommendations. He barely used his feeding tube and had lost more than 20 pounds. He continued to have daily coughing due to thick mucous, daily nausea and vomiting. Pain is better controlled. He has persistent dysphagia. The rash around his neck is resolving.  REVIEW OF SYSTEMS:   Constitutional: Denies fevers, chills  Eyes: Denies  blurriness of vision Cardiovascular: Denies palpitation, chest discomfort or lower extremity swelling Lymphatics: Denies new lymphadenopathy or easy bruising Neurological:Denies numbness, tingling or new weaknesses Behavioral/Psych: Mood is stable, no new changes  All other systems were reviewed with the patient and are negative.  I have reviewed the past medical history, past surgical history, social history and family history with the patient and they are unchanged from previous note.  ALLERGIES:  is allergic to pollen extract.  MEDICATIONS:  Current Outpatient Prescriptions  Medication Sig Dispense Refill  . Alum & Mag Hydroxide-Simeth (MAGIC MOUTHWASH W/LIDOCAINE) SOLN Swish and swallow 10 mLs 4 (four) times daily. Take 30 minutes before meals and at bedtime.      Marland Kitchen amLODipine (NORVASC) 10 MG tablet Take 1 tablet (10 mg total) by mouth daily.  30 tablet  3  . chlorhexidine (PERIDEX) 0.12 % solution Use as directed 15 mLs in the mouth or throat 2 (two) times daily. Use for 30 seconds after breakfast and dinner. Spit out excess. Do not swallow.      . docusate sodium 100 MG CAPS Take 200 mg by mouth 2 (two) times daily.  60 capsule  1  . emollient (BIAFINE) cream Apply 1 application topically 2 (two) times daily. Apply to affected skin area afterrad and bedtime daily and on weekends daily      . fentaNYL (DURAGESIC - DOSED MCG/HR) 50 MCG/HR Place 1 patch (50 mcg total) onto the skin every 3 (three) days.  5 patch  0  . guaiFENesin (MUCINEX) 600 MG 12 hr tablet Take 2 tablets (1,200 mg total) by mouth 2 (two) times daily.  60 tablet  1  . hydrALAZINE (APRESOLINE)  50 MG tablet Take 1 tablet (50 mg total) by mouth every 8 (eight) hours.  90 tablet  1  . lactulose (CHRONULAC) 10 GM/15ML solution Take 45 mLs (30 g total) by mouth 2 (two) times daily.  500 mL  2  . lidocaine-prilocaine (EMLA) cream Apply 1 application topically as needed (For port-a-cath.).      Marland Kitchen LORazepam (ATIVAN) 1 MG tablet  Take 1 tablet (1 mg total) by mouth every 8 (eight) hours.  30 tablet  1  . metoprolol (LOPRESSOR) 50 MG tablet Take 1 tablet (50 mg total) by mouth 2 (two) times daily.  60 tablet  1  . Nutritional Supplements (FEEDING SUPPLEMENT, OSMOLITE 1.5 CAL,) LIQD Increase Osmolite 1.5 to goal of 2 cans QID via feeding tube with 60 ml free water before and after each bolus feeding.  Drink or put 300 ml free water in tube TID.  1920 mL  0  . oxyCODONE (ROXICODONE) 15 MG immediate release tablet Take 1 tablet (15 mg total) by mouth every 3 (three) hours as needed.  90 tablet  0  . polyethylene glycol (MIRALAX / GLYCOLAX) packet Take 17 g by mouth daily.  30 each  0  . prochlorperazine (COMPAZINE) 10 MG tablet Take 1 tablet (10 mg total) by mouth every 6 (six) hours as needed (Nausea or vomiting).  30 tablet  2  . sodium fluoride (FLUORISHIELD) 1.1 % GEL dental gel Place 1 drop onto teeth at bedtime. Instill one drop into each tooth space of fluoride tray. Place over teeth for 5 minutes. Remove. Spit out excess. Repeat nightly.      . sucralfate (CARAFATE) 1 G tablet Take 1 g by mouth 4 (four) times daily as needed (Dissolve one tablet in 100ml of water and swallow for sore throat.).       No current facility-administered medications for this visit.    PHYSICAL EXAMINATION: ECOG PERFORMANCE STATUS: 1 - Symptomatic but completely ambulatory  Filed Vitals:   10/23/13 1218  BP: 139/88  Pulse: 80  Temp: 97.8 F (36.6 C)  Resp: 18   Filed Weights   10/23/13 1218  Weight: 266 lb 12.8 oz (121.02 kg)    GENERAL:alert, no distress and comfortable SKIN: he has dry skin around his neck. No ulceration. EYES: normal, Conjunctiva are pink and non-injected, sclera clear OROPHARYNX:no exudate, no erythema and lips, buccal mucosa, and tongue is dry. Persistent mucositis with no thrush. NECK: supple, thyroid normal size, non-tender, without nodularity LYMPH:  no palpable lymphadenopathy in the cervical, axillary  or inguinal LUNGS: clear to auscultation and percussion with normal breathing effort HEART: regular rate & rhythm and no murmurs and no lower extremity edema ABDOMEN:abdomen soft, non-tender and normal bowel sounds. Feeding tube site looks OK. Musculoskeletal:no cyanosis of digits and no clubbing  NEURO: alert & oriented x 3 with fluent speech, no focal motor/sensory deficits  LABORATORY DATA:  I have reviewed the data as listed    Component Value Date/Time   NA 137 10/23/2013 1244   NA 132* 09/30/2013 0522   K 3.9 10/23/2013 1244   K 4.9 09/30/2013 0522   CL 93* 09/30/2013 0522   CO2 26 10/23/2013 1244   CO2 26 09/30/2013 0522   GLUCOSE 83 10/23/2013 1244   GLUCOSE 124* 09/30/2013 0522   BUN 12.1 10/23/2013 1244   BUN 24* 09/30/2013 0522   CREATININE 1.2 10/23/2013 1244   CREATININE 1.27 09/30/2013 0522   CALCIUM 9.4 10/23/2013 1244   CALCIUM 9.4 09/30/2013 0522  PROT 6.7 10/23/2013 1244   PROT 7.5 09/30/2013 0522   ALBUMIN 3.4* 10/23/2013 1244   ALBUMIN 3.4* 09/30/2013 0522   AST 24 10/23/2013 1244   AST 24 09/30/2013 0522   ALT 29 10/23/2013 1244   ALT 43 09/30/2013 0522   ALKPHOS 85 10/23/2013 1244   ALKPHOS 101 09/30/2013 0522   BILITOT 0.41 10/23/2013 1244   BILITOT 0.3 09/30/2013 0522   GFRNONAA 66* 09/30/2013 0522   GFRAA 77* 09/30/2013 0522    No results found for this basename: SPEP, UPEP,  kappa and lambda light chains    Lab Results  Component Value Date   WBC 6.2 10/23/2013   NEUTROABS 5.1 10/23/2013   HGB 10.8* 10/23/2013   HCT 33.3* 10/23/2013   MCV 90.2 10/23/2013   PLT 269 10/23/2013      Chemistry      Component Value Date/Time   NA 137 10/23/2013 1244   NA 132* 09/30/2013 0522   K 3.9 10/23/2013 1244   K 4.9 09/30/2013 0522   CL 93* 09/30/2013 0522   CO2 26 10/23/2013 1244   CO2 26 09/30/2013 0522   BUN 12.1 10/23/2013 1244   BUN 24* 09/30/2013 0522   CREATININE 1.2 10/23/2013 1244   CREATININE 1.27 09/30/2013 0522      Component Value Date/Time   CALCIUM 9.4 10/23/2013 1244   CALCIUM 9.4  09/30/2013 0522   ALKPHOS 85 10/23/2013 1244   ALKPHOS 101 09/30/2013 0522   AST 24 10/23/2013 1244   AST 24 09/30/2013 0522   ALT 29 10/23/2013 1244   ALT 43 09/30/2013 0522   BILITOT 0.41 10/23/2013 1244   BILITOT 0.3 09/30/2013 0522     ASSESSMENT & PLAN:  Tonsillar cancer Continue aggressive supportive care. The patient will be seen on a regular basis for supportive care with plan for PET/CT scan in 4 months to assess response to treatment.    Protein-calorie malnutrition, severe Continue to feeding as tolerated. He needs to use 8 cans of nutritional supplements daily   Nausea & vomiting Continue aggressive IV fluids daily due to recurrent renal failure. He will also get IV antiemetics as needed.    Cough This is due to the thick secretion from radiation treatment. Continue conservative management with Mucinex.    Chest wall pain This is due to severe coughing. Continue oxycodone as needed. I refilled his prescription today. I have weaned him off fentanyl patch    Anemia in neoplastic disease This is likely anemia of chronic disease. The patient denies recent history of bleeding such as epistaxis, hematuria or hematochezia. He is asymptomatic from the anemia. We will observe for now.  He does not require transfusion now.    Acute renal failure This was related to cisplatin treatment and severe dehydration With aggressive fluid resuscitation, this has improved. Continue to monitor closely and he will continue daily IVF until he can maintain adequate intake via his feeding tube   Dehydration This is due to reduced intake through his feeding tube. He will receive daily IVF.    All questions were answered. The patient knows to call the clinic with any problems, questions or concerns. No barriers to learning was detected. I spent 40 minutes counseling the patient face to face. The total time spent in the appointment was 55 minutes and more than 50% was on counseling and  review of test results     [facility], [place], MD 10/23/2013 8:42 PM

## 2013-10-23 NOTE — Patient Instructions (Signed)
Dehydration, Adult Dehydration is when you lose more fluids from the body than you take in. Vital organs like the kidneys, brain, and heart cannot function without a proper amount of fluids and salt. Any loss of fluids from the body can cause dehydration.  CAUSES   Vomiting.  Diarrhea.  Excessive sweating.  Excessive urine output.  Fever. SYMPTOMS  Mild dehydration  Thirst.  Dry lips.  Slightly dry mouth. Moderate dehydration  Very dry mouth.  Sunken eyes.  Skin does not bounce back quickly when lightly pinched and released.  Dark urine and decreased urine production.  Decreased tear production.  Headache. Severe dehydration  Very dry mouth.  Extreme thirst.  Rapid, weak pulse (more than 100 beats per minute at rest).  Cold hands and feet.  Not able to sweat in spite of heat and temperature.  Rapid breathing.  Blue lips.  Confusion and lethargy.  Difficulty being awakened.  Minimal urine production.  No tears. DIAGNOSIS  Your caregiver will diagnose dehydration based on your symptoms and your exam. Blood and urine tests will help confirm the diagnosis. The diagnostic evaluation should also identify the cause of dehydration. TREATMENT  Treatment of mild or moderate dehydration can often be done at home by increasing the amount of fluids that you drink. It is best to drink small amounts of fluid more often. Drinking too much at one time can make vomiting worse. Refer to the home care instructions below. Severe dehydration needs to be treated at the hospital where you will probably be given intravenous (IV) fluids that contain water and electrolytes. HOME CARE INSTRUCTIONS   Ask your caregiver about specific rehydration instructions.  Drink enough fluids to keep your urine clear or pale yellow.  Drink small amounts frequently if you have nausea and vomiting.  Eat as you normally do.  Avoid:  Foods or drinks high in sugar.  Carbonated  drinks.  Juice.  Extremely hot or cold fluids.  Drinks with caffeine.  Fatty, greasy foods.  Alcohol.  Tobacco.  Overeating.  Gelatin desserts.  Wash your hands well to avoid spreading bacteria and viruses.  Only take over-the-counter or prescription medicines for pain, discomfort, or fever as directed by your caregiver.  Ask your caregiver if you should continue all prescribed and over-the-counter medicines.  Keep all follow-up appointments with your caregiver. SEEK MEDICAL CARE IF:  You have abdominal pain and it increases or stays in one area (localizes).  You have a rash, stiff neck, or severe headache.  You are irritable, sleepy, or difficult to awaken.  You are weak, dizzy, or extremely thirsty. SEEK IMMEDIATE MEDICAL CARE IF:   You are unable to keep fluids down or you get worse despite treatment.  You have frequent episodes of vomiting or diarrhea.  You have blood or green matter (bile) in your vomit.  You have blood in your stool or your stool looks black and tarry.  You have not urinated in 6 to 8 hours, or you have only urinated a small amount of very dark urine.  You have a fever.  You faint. MAKE SURE YOU:   Understand these instructions.  Will watch your condition.  Will get help right away if you are not doing well or get worse. Document Released: 04/04/2005 Document Revised: 06/27/2011 Document Reviewed: 11/22/2010 ExitCare Patient Information 2015 [employer]. This information is not intended to replace advice given to you by your health care provider. Make sure you discuss any questions you have with your health care   provider.  

## 2013-10-23 NOTE — Assessment & Plan Note (Signed)
This is likely anemia of chronic disease. The patient denies recent history of bleeding such as epistaxis, hematuria or hematochezia. He is asymptomatic from the anemia. We will observe for now.  He does not require transfusion now.   

## 2013-10-23 NOTE — Assessment & Plan Note (Signed)
Continue aggressive IV fluids daily due to recurrent renal failure. He will also get IV antiemetics as needed.

## 2013-10-23 NOTE — Assessment & Plan Note (Signed)
This is due to severe coughing. Continue oxycodone as needed. I refilled his prescription today. I have weaned him off fentanyl patch

## 2013-10-23 NOTE — Assessment & Plan Note (Signed)
This is due to reduced intake through his feeding tube. He will receive daily IVF.

## 2013-10-24 ENCOUNTER — Ambulatory Visit (HOSPITAL_BASED_OUTPATIENT_CLINIC_OR_DEPARTMENT_OTHER): Payer: Medicaid Other

## 2013-10-24 VITALS — BP 150/90 | HR 86 | Temp 98.4°F | Resp 18

## 2013-10-24 DIAGNOSIS — R110 Nausea: Secondary | ICD-10-CM

## 2013-10-24 DIAGNOSIS — N179 Acute kidney failure, unspecified: Secondary | ICD-10-CM

## 2013-10-24 DIAGNOSIS — R1110 Vomiting, unspecified: Secondary | ICD-10-CM

## 2013-10-24 DIAGNOSIS — E860 Dehydration: Secondary | ICD-10-CM

## 2013-10-24 DIAGNOSIS — C099 Malignant neoplasm of tonsil, unspecified: Secondary | ICD-10-CM

## 2013-10-24 MED ORDER — PROMETHAZINE HCL 25 MG/ML IJ SOLN
25.0000 mg | Freq: Every day | INTRAMUSCULAR | Status: DC | PRN
Start: 2013-10-24 — End: 2013-10-24
  Administered 2013-10-24: 25 mg via INTRAVENOUS
  Filled 2013-10-24: qty 1

## 2013-10-24 MED ORDER — HEPARIN SOD (PORK) LOCK FLUSH 100 UNIT/ML IV SOLN
500.0000 [IU] | Freq: Once | INTRAVENOUS | Status: AC | PRN
  Administered 2013-10-24: 500 [IU]
  Filled 2013-10-24: qty 5

## 2013-10-24 MED ORDER — SODIUM CHLORIDE 0.9 % IV SOLN
1500.0000 mL | Freq: Every day | INTRAVENOUS | Status: DC | PRN
  Administered 2013-10-24: 250 mL via INTRAVENOUS

## 2013-10-24 MED ORDER — SODIUM CHLORIDE 0.9 % IJ SOLN
10.0000 mL | INTRAMUSCULAR | Status: DC | PRN
  Administered 2013-10-24: 10 mL
  Filled 2013-10-24: qty 10

## 2013-10-24 NOTE — Patient Instructions (Signed)
Nausea and Vomiting  Nausea is a sick feeling that often comes before throwing up (vomiting). Vomiting is a reflex where stomach contents come out of your mouth. Vomiting can cause severe loss of body fluids (dehydration). Children and elderly adults can become dehydrated quickly, especially if they also have diarrhea. Nausea and vomiting are symptoms of a condition or disease. It is important to find the cause of your symptoms.  CAUSES    Direct irritation of the stomach lining. This irritation can result from increased acid production (gastroesophageal reflux disease), infection, food poisoning, taking certain medicines (such as nonsteroidal anti-inflammatory drugs), alcohol use, or tobacco use.   Signals from the brain.These signals could be caused by a headache, heat exposure, an inner ear disturbance, increased pressure in the brain from injury, infection, a tumor, or a concussion, pain, emotional stimulus, or metabolic problems.   An obstruction in the gastrointestinal tract (bowel obstruction).   Illnesses such as diabetes, hepatitis, gallbladder problems, appendicitis, kidney problems, cancer, sepsis, atypical symptoms of a heart attack, or eating disorders.   Medical treatments such as chemotherapy and radiation.   Receiving medicine that makes you sleep (general anesthetic) during surgery.  DIAGNOSIS  Your caregiver may ask for tests to be done if the problems do not improve after a few days. Tests may also be done if symptoms are severe or if the reason for the nausea and vomiting is not clear. Tests may include:   Urine tests.   Blood tests.   Stool tests.   Cultures (to look for evidence of infection).   X-rays or other imaging studies.  Test results can help your caregiver make decisions about treatment or the need for additional tests.  TREATMENT  You need to stay well hydrated. Drink frequently but in small amounts.You may wish to drink water, sports drinks, clear broth, or eat  frozen ice pops or gelatin dessert to help stay hydrated.When you eat, eating slowly may help prevent nausea.There are also some antinausea medicines that may help prevent nausea.  HOME CARE INSTRUCTIONS    Take all medicine as directed by your caregiver.   If you do not have an appetite, do not force yourself to eat. However, you must continue to drink fluids.   If you have an appetite, eat a normal diet unless your caregiver tells you differently.   Eat a variety of complex carbohydrates (rice, wheat, potatoes, bread), lean meats, yogurt, fruits, and vegetables.   Avoid high-fat foods because they are more difficult to digest.   Drink enough water and fluids to keep your urine clear or pale yellow.   If you are dehydrated, ask your caregiver for specific rehydration instructions. Signs of dehydration may include:   Severe thirst.   Dry lips and mouth.   Dizziness.   Dark urine.   Decreasing urine frequency and amount.   Confusion.   Rapid breathing or pulse.  SEEK IMMEDIATE MEDICAL CARE IF:    You have blood or brown flecks (like coffee grounds) in your vomit.   You have black or bloody stools.   You have a severe headache or stiff neck.   You are confused.   You have severe abdominal pain.   You have chest pain or trouble breathing.   You do not urinate at least once every 8 hours.   You develop cold or clammy skin.   You continue to vomit for longer than 24 to 48 hours.   You have a fever.  MAKE SURE YOU:      Understand these instructions.   Will watch your condition.   Will get help right away if you are not doing well or get worse.  Document Released: 04/04/2005 Document Revised: 06/27/2011 Document Reviewed: 09/01/2010  ExitCare Patient Information 2015 [employer]. This information is not intended to replace advice given to you by your health care provider. Make sure you discuss any questions you have with your health care provider.  Dehydration, Adult  Dehydration is when you  lose more fluids from the body than you take in. Vital organs like the kidneys, brain, and heart cannot function without a proper amount of fluids and salt. Any loss of fluids from the body can cause dehydration.   CAUSES    Vomiting.   Diarrhea.   Excessive sweating.   Excessive urine output.   Fever.  SYMPTOMS   Mild dehydration   Thirst.   Dry lips.   Slightly dry mouth.  Moderate dehydration   Very dry mouth.   Sunken eyes.   Skin does not bounce back quickly when lightly pinched and released.   Dark urine and decreased urine production.   Decreased tear production.   Headache.  Severe dehydration   Very dry mouth.   Extreme thirst.   Rapid, weak pulse (more than 100 beats per minute at rest).   Cold hands and feet.   Not able to sweat in spite of heat and temperature.   Rapid breathing.   Blue lips.   Confusion and lethargy.   Difficulty being awakened.   Minimal urine production.   No tears.  DIAGNOSIS   Your caregiver will diagnose dehydration based on your symptoms and your exam. Blood and urine tests will help confirm the diagnosis. The diagnostic evaluation should also identify the cause of dehydration.  TREATMENT   Treatment of mild or moderate dehydration can often be done at home by increasing the amount of fluids that you drink. It is best to drink small amounts of fluid more often. Drinking too much at one time can make vomiting worse. Refer to the home care instructions below.  Severe dehydration needs to be treated at the hospital where you will probably be given intravenous (IV) fluids that contain water and electrolytes.  HOME CARE INSTRUCTIONS    Ask your caregiver about specific rehydration instructions.   Drink enough fluids to keep your urine clear or pale yellow.   Drink small amounts frequently if you have nausea and vomiting.   Eat as you normally do.   Avoid:   Foods or drinks high in sugar.   Carbonated drinks.   Juice.   Extremely hot or cold  fluids.   Drinks with caffeine.   Fatty, greasy foods.   Alcohol.   Tobacco.   Overeating.   Gelatin desserts.   Wash your hands well to avoid spreading bacteria and viruses.   Only take over-the-counter or prescription medicines for pain, discomfort, or fever as directed by your caregiver.   Ask your caregiver if you should continue all prescribed and over-the-counter medicines.   Keep all follow-up appointments with your caregiver.  SEEK MEDICAL CARE IF:   You have abdominal pain and it increases or stays in one area (localizes).   You have a rash, stiff neck, or severe headache.   You are irritable, sleepy, or difficult to awaken.   You are weak, dizzy, or extremely thirsty.  SEEK IMMEDIATE MEDICAL CARE IF:    You are unable to keep fluids down or you get worse despite   treatment.   You have frequent episodes of vomiting or diarrhea.   You have blood or green matter (bile) in your vomit.   You have blood in your stool or your stool looks black and tarry.   You have not urinated in 6 to 8 hours, or you have only urinated a small amount of very dark urine.   You have a fever.   You faint.  MAKE SURE YOU:    Understand these instructions.   Will watch your condition.   Will get help right away if you are not doing well or get worse.  Document Released: 04/04/2005 Document Revised: 06/27/2011 Document Reviewed: 11/22/2010  [employer] Patient Information 2015 [employer]. This information is not intended to replace advice given to you by your health care provider. Make sure you discuss any questions you have with your health care provider.

## 2013-10-24 NOTE — Progress Notes (Signed)
Mr.Mooty asked to receive phenergan only and the IVF.  1630 1L 0.9% normal saline infused.  500 ml bag hung at this time to equal 1.5L ordered today.

## 2013-10-25 ENCOUNTER — Ambulatory Visit (HOSPITAL_BASED_OUTPATIENT_CLINIC_OR_DEPARTMENT_OTHER): Payer: Medicaid Other

## 2013-10-25 ENCOUNTER — Other Ambulatory Visit: Payer: Self-pay | Admitting: *Deleted

## 2013-10-25 VITALS — BP 134/85 | HR 79 | Temp 98.3°F | Resp 18

## 2013-10-25 DIAGNOSIS — R1110 Vomiting, unspecified: Secondary | ICD-10-CM

## 2013-10-25 DIAGNOSIS — C099 Malignant neoplasm of tonsil, unspecified: Secondary | ICD-10-CM

## 2013-10-25 DIAGNOSIS — R112 Nausea with vomiting, unspecified: Secondary | ICD-10-CM

## 2013-10-25 MED ORDER — SODIUM CHLORIDE 0.9 % IJ SOLN
10.0000 mL | INTRAMUSCULAR | Status: DC | PRN
  Administered 2013-10-25: 10 mL
  Filled 2013-10-25: qty 10

## 2013-10-25 MED ORDER — HEPARIN SOD (PORK) LOCK FLUSH 100 UNIT/ML IV SOLN
500.0000 [IU] | Freq: Once | INTRAVENOUS | Status: AC | PRN
  Administered 2013-10-25: 500 [IU]
  Filled 2013-10-25: qty 5

## 2013-10-25 MED ORDER — SODIUM CHLORIDE 0.9 % IV SOLN
Freq: Every day | INTRAVENOUS | Status: DC | PRN
  Administered 2013-10-25: 15:00:00 via INTRAVENOUS

## 2013-10-25 MED ORDER — PROMETHAZINE HCL 25 MG/ML IJ SOLN
25.0000 mg | Freq: Every day | INTRAMUSCULAR | Status: DC | PRN
  Administered 2013-10-25: 25 mg via INTRAVENOUS
  Filled 2013-10-25: qty 1

## 2013-10-25 NOTE — Patient Instructions (Signed)
Dehydration, Adult Dehydration is when you lose more fluids from the body than you take in. Vital organs like the kidneys, brain, and heart cannot function without a proper amount of fluids and salt. Any loss of fluids from the body can cause dehydration.  CAUSES   Vomiting.  Diarrhea.  Excessive sweating.  Excessive urine output.  Fever. SYMPTOMS  Mild dehydration  Thirst.  Dry lips.  Slightly dry mouth. Moderate dehydration  Very dry mouth.  Sunken eyes.  Skin does not bounce back quickly when lightly pinched and released.  Dark urine and decreased urine production.  Decreased tear production.  Headache. Severe dehydration  Very dry mouth.  Extreme thirst.  Rapid, weak pulse (more than 100 beats per minute at rest).  Cold hands and feet.  Not able to sweat in spite of heat and temperature.  Rapid breathing.  Blue lips.  Confusion and lethargy.  Difficulty being awakened.  Minimal urine production.  No tears. DIAGNOSIS  Your caregiver will diagnose dehydration based on your symptoms and your exam. Blood and urine tests will help confirm the diagnosis. The diagnostic evaluation should also identify the cause of dehydration. TREATMENT  Treatment of mild or moderate dehydration can often be done at home by increasing the amount of fluids that you drink. It is best to drink small amounts of fluid more often. Drinking too much at one time can make vomiting worse. Refer to the home care instructions below. Severe dehydration needs to be treated at the hospital where you will probably be given intravenous (IV) fluids that contain water and electrolytes. HOME CARE INSTRUCTIONS   Ask your caregiver about specific rehydration instructions.  Drink enough fluids to keep your urine clear or pale yellow.  Drink small amounts frequently if you have nausea and vomiting.  Eat as you normally do.  Avoid:  Foods or drinks high in sugar.  Carbonated  drinks.  Juice.  Extremely hot or cold fluids.  Drinks with caffeine.  Fatty, greasy foods.  Alcohol.  Tobacco.  Overeating.  Gelatin desserts.  Wash your hands well to avoid spreading bacteria and viruses.  Only take over-the-counter or prescription medicines for pain, discomfort, or fever as directed by your caregiver.  Ask your caregiver if you should continue all prescribed and over-the-counter medicines.  Keep all follow-up appointments with your caregiver. SEEK MEDICAL CARE IF:  You have abdominal pain and it increases or stays in one area (localizes).  You have a rash, stiff neck, or severe headache.  You are irritable, sleepy, or difficult to awaken.  You are weak, dizzy, or extremely thirsty. SEEK IMMEDIATE MEDICAL CARE IF:   You are unable to keep fluids down or you get worse despite treatment.  You have frequent episodes of vomiting or diarrhea.  You have blood or green matter (bile) in your vomit.  You have blood in your stool or your stool looks black and tarry.  You have not urinated in 6 to 8 hours, or you have only urinated a small amount of very dark urine.  You have a fever.  You faint. MAKE SURE YOU:   Understand these instructions.  Will watch your condition.  Will get help right away if you are not doing well or get worse. Document Released: 04/04/2005 Document Revised: 06/27/2011 Document Reviewed: 11/22/2010 ExitCare Patient Information 2015 [employer]. This information is not intended to replace advice given to you by your health care provider. Make sure you discuss any questions you have with your health care   provider.  

## 2013-10-28 ENCOUNTER — Ambulatory Visit (HOSPITAL_BASED_OUTPATIENT_CLINIC_OR_DEPARTMENT_OTHER): Payer: Medicaid Other

## 2013-10-28 VITALS — BP 176/95 | HR 82 | Temp 97.9°F | Resp 20

## 2013-10-28 DIAGNOSIS — R1110 Vomiting, unspecified: Secondary | ICD-10-CM

## 2013-10-28 DIAGNOSIS — R112 Nausea with vomiting, unspecified: Secondary | ICD-10-CM

## 2013-10-28 DIAGNOSIS — C099 Malignant neoplasm of tonsil, unspecified: Secondary | ICD-10-CM

## 2013-10-28 MED ORDER — SODIUM CHLORIDE 0.9 % IJ SOLN
10.0000 mL | INTRAMUSCULAR | Status: DC | PRN
  Administered 2013-10-28: 10 mL
  Filled 2013-10-28: qty 10

## 2013-10-28 MED ORDER — SODIUM CHLORIDE 0.9 % IV SOLN
1500.0000 mL | Freq: Every day | INTRAVENOUS | Status: DC | PRN
  Administered 2013-10-28: 16:00:00 via INTRAVENOUS

## 2013-10-28 MED ORDER — PROMETHAZINE HCL 25 MG/ML IJ SOLN
25.0000 mg | Freq: Every day | INTRAMUSCULAR | Status: DC | PRN
  Administered 2013-10-28: 25 mg via INTRAVENOUS
  Filled 2013-10-28: qty 1

## 2013-10-28 MED ORDER — HEPARIN SOD (PORK) LOCK FLUSH 100 UNIT/ML IV SOLN
500.0000 [IU] | Freq: Once | INTRAVENOUS | Status: AC | PRN
  Administered 2013-10-28: 500 [IU]
  Filled 2013-10-28: qty 5

## 2013-10-28 NOTE — Patient Instructions (Signed)
Dehydration, Adult Dehydration is when you lose more fluids from the body than you take in. Vital organs like the kidneys, brain, and heart cannot function without a proper amount of fluids and salt. Any loss of fluids from the body can cause dehydration.  CAUSES   Vomiting.  Diarrhea.  Excessive sweating.  Excessive urine output.  Fever. SYMPTOMS  Mild dehydration  Thirst.  Dry lips.  Slightly dry mouth. Moderate dehydration  Very dry mouth.  Sunken eyes.  Skin does not bounce back quickly when lightly pinched and released.  Dark urine and decreased urine production.  Decreased tear production.  Headache. Severe dehydration  Very dry mouth.  Extreme thirst.  Rapid, weak pulse (more than 100 beats per minute at rest).  Cold hands and feet.  Not able to sweat in spite of heat and temperature.  Rapid breathing.  Blue lips.  Confusion and lethargy.  Difficulty being awakened.  Minimal urine production.  No tears. DIAGNOSIS  Your caregiver will diagnose dehydration based on your symptoms and your exam. Blood and urine tests will help confirm the diagnosis. The diagnostic evaluation should also identify the cause of dehydration. TREATMENT  Treatment of mild or moderate dehydration can often be done at home by increasing the amount of fluids that you drink. It is best to drink small amounts of fluid more often. Drinking too much at one time can make vomiting worse. Refer to the home care instructions below. Severe dehydration needs to be treated at the hospital where you will probably be given intravenous (IV) fluids that contain water and electrolytes. HOME CARE INSTRUCTIONS   Ask your caregiver about specific rehydration instructions.  Drink enough fluids to keep your urine clear or pale yellow.  Drink small amounts frequently if you have nausea and vomiting.  Eat as you normally do.  Avoid:  Foods or drinks high in sugar.  Carbonated  drinks.  Juice.  Extremely hot or cold fluids.  Drinks with caffeine.  Fatty, greasy foods.  Alcohol.  Tobacco.  Overeating.  Gelatin desserts.  Wash your hands well to avoid spreading bacteria and viruses.  Only take over-the-counter or prescription medicines for pain, discomfort, or fever as directed by your caregiver.  Ask your caregiver if you should continue all prescribed and over-the-counter medicines.  Keep all follow-up appointments with your caregiver. SEEK MEDICAL CARE IF:  You have abdominal pain and it increases or stays in one area (localizes).  You have a rash, stiff neck, or severe headache.  You are irritable, sleepy, or difficult to awaken.  You are weak, dizzy, or extremely thirsty. SEEK IMMEDIATE MEDICAL CARE IF:   You are unable to keep fluids down or you get worse despite treatment.  You have frequent episodes of vomiting or diarrhea.  You have blood or green matter (bile) in your vomit.  You have blood in your stool or your stool looks black and tarry.  You have not urinated in 6 to 8 hours, or you have only urinated a small amount of very dark urine.  You have a fever.  You faint. MAKE SURE YOU:   Understand these instructions.  Will watch your condition.  Will get help right away if you are not doing well or get worse. Document Released: 04/04/2005 Document Revised: 06/27/2011 Document Reviewed: 11/22/2010 ExitCare Patient Information 2015 [employer]. This information is not intended to replace advice given to you by your health care provider. Make sure you discuss any questions you have with your health care   provider.  

## 2013-10-28 NOTE — Progress Notes (Signed)
Patient post vital signs stable; patient ambulates well; discharged home with wife and no complaints.

## 2013-10-29 ENCOUNTER — Encounter: Payer: Self-pay | Admitting: Radiation Oncology

## 2013-10-29 ENCOUNTER — Ambulatory Visit (HOSPITAL_BASED_OUTPATIENT_CLINIC_OR_DEPARTMENT_OTHER): Payer: Medicaid Other

## 2013-10-29 ENCOUNTER — Ambulatory Visit: Payer: Medicaid Other | Admitting: Nutrition

## 2013-10-29 VITALS — BP 147/88 | HR 71 | Temp 98.7°F | Resp 18

## 2013-10-29 DIAGNOSIS — R112 Nausea with vomiting, unspecified: Secondary | ICD-10-CM

## 2013-10-29 DIAGNOSIS — C099 Malignant neoplasm of tonsil, unspecified: Secondary | ICD-10-CM

## 2013-10-29 DIAGNOSIS — R1110 Vomiting, unspecified: Secondary | ICD-10-CM

## 2013-10-29 MED ORDER — HEPARIN SOD (PORK) LOCK FLUSH 100 UNIT/ML IV SOLN
500.0000 [IU] | Freq: Once | INTRAVENOUS | Status: AC | PRN
  Administered 2013-10-29: 500 [IU]
  Filled 2013-10-29: qty 5

## 2013-10-29 MED ORDER — PROMETHAZINE HCL 25 MG/ML IJ SOLN
25.0000 mg | Freq: Every day | INTRAMUSCULAR | Status: DC | PRN
  Administered 2013-10-29: 25 mg via INTRAVENOUS
  Filled 2013-10-29: qty 1

## 2013-10-29 MED ORDER — SODIUM CHLORIDE 0.9 % IJ SOLN
10.0000 mL | INTRAMUSCULAR | Status: DC | PRN
  Administered 2013-10-29: 10 mL
  Filled 2013-10-29: qty 10

## 2013-10-29 MED ORDER — SODIUM CHLORIDE 0.9 % IV SOLN
1500.0000 mL | Freq: Every day | INTRAVENOUS | Status: DC | PRN
  Administered 2013-10-29: 14:00:00 via INTRAVENOUS

## 2013-10-29 NOTE — Progress Notes (Signed)
Nutrition followup completed with patient while he was receiving IV fluids.  Patient's weight has decreased was documented as 266.8 pounds July 8, down from 289 pounds on June 29.  Patient is not eating by mouth.  However, he does drink water.  Patient has been educated on using his feeding tube. Goal was to give Osmolite 1.5 one half cans 4 times a day.  However, patient has been noncompliant.  Patient reports he is "just getting to goal rate."  Explained the importance of adequate calories and protein to promote appropriate healing.  Explained patient's 23 pound weight loss in 3 weeks would likely impact his healing process.  Patient assures me that he understands how to give his feedings and he has no tolerance issues.  Encouraged patient to contact me with questions or concerns.    **Disclaimer: This note was dictated with voice recognition software. Similar sounding words can inadvertently be transcribed and this note may contain transcription errors which may not have been corrected upon publication of note.**

## 2013-10-29 NOTE — Patient Instructions (Signed)
Dehydration, Adult Dehydration is when you lose more fluids from the body than you take in. Vital organs like the kidneys, brain, and heart cannot function without a proper amount of fluids and salt. Any loss of fluids from the body can cause dehydration.  CAUSES   Vomiting.  Diarrhea.  Excessive sweating.  Excessive urine output.  Fever. SYMPTOMS  Mild dehydration  Thirst.  Dry lips.  Slightly dry mouth. Moderate dehydration  Very dry mouth.  Sunken eyes.  Skin does not bounce back quickly when lightly pinched and released.  Dark urine and decreased urine production.  Decreased tear production.  Headache. Severe dehydration  Very dry mouth.  Extreme thirst.  Rapid, weak pulse (more than 100 beats per minute at rest).  Cold hands and feet.  Not able to sweat in spite of heat and temperature.  Rapid breathing.  Blue lips.  Confusion and lethargy.  Difficulty being awakened.  Minimal urine production.  No tears. DIAGNOSIS  Your caregiver will diagnose dehydration based on your symptoms and your exam. Blood and urine tests will help confirm the diagnosis. The diagnostic evaluation should also identify the cause of dehydration. TREATMENT  Treatment of mild or moderate dehydration can often be done at home by increasing the amount of fluids that you drink. It is best to drink small amounts of fluid more often. Drinking too much at one time can make vomiting worse. Refer to the home care instructions below. Severe dehydration needs to be treated at the hospital where you will probably be given intravenous (IV) fluids that contain water and electrolytes. HOME CARE INSTRUCTIONS   Ask your caregiver about specific rehydration instructions.  Drink enough fluids to keep your urine clear or pale yellow.  Drink small amounts frequently if you have nausea and vomiting.  Eat as you normally do.  Avoid:  Foods or drinks high in sugar.  Carbonated  drinks.  Juice.  Extremely hot or cold fluids.  Drinks with caffeine.  Fatty, greasy foods.  Alcohol.  Tobacco.  Overeating.  Gelatin desserts.  Wash your hands well to avoid spreading bacteria and viruses.  Only take over-the-counter or prescription medicines for pain, discomfort, or fever as directed by your caregiver.  Ask your caregiver if you should continue all prescribed and over-the-counter medicines.  Keep all follow-up appointments with your caregiver. SEEK MEDICAL CARE IF:  You have abdominal pain and it increases or stays in one area (localizes).  You have a rash, stiff neck, or severe headache.  You are irritable, sleepy, or difficult to awaken.  You are weak, dizzy, or extremely thirsty. SEEK IMMEDIATE MEDICAL CARE IF:   You are unable to keep fluids down or you get worse despite treatment.  You have frequent episodes of vomiting or diarrhea.  You have blood or green matter (bile) in your vomit.  You have blood in your stool or your stool looks black and tarry.  You have not urinated in 6 to 8 hours, or you have only urinated a small amount of very dark urine.  You have a fever.  You faint. MAKE SURE YOU:   Understand these instructions.  Will watch your condition.  Will get help right away if you are not doing well or get worse. Document Released: 04/04/2005 Document Revised: 06/27/2011 Document Reviewed: 11/22/2010 ExitCare Patient Information 2015 [employer]. This information is not intended to replace advice given to you by your health care provider. Make sure you discuss any questions you have with your health care   provider.  

## 2013-10-30 ENCOUNTER — Encounter: Payer: Self-pay | Admitting: Radiation Oncology

## 2013-10-30 ENCOUNTER — Ambulatory Visit
Admission: RE | Admit: 2013-10-30 | Discharge: 2013-10-30 | Disposition: A | Discharge status: Home / Self Care | Payer: Medicaid Other | Source: Ambulatory Visit | Attending: Radiation Oncology | Admitting: Radiation Oncology

## 2013-10-30 ENCOUNTER — Other Ambulatory Visit: Payer: Self-pay | Admitting: *Deleted

## 2013-10-30 ENCOUNTER — Ambulatory Visit (HOSPITAL_BASED_OUTPATIENT_CLINIC_OR_DEPARTMENT_OTHER): Payer: Medicaid Other

## 2013-10-30 VITALS — BP 153/94 | HR 76 | Temp 98.1°F | Resp 18

## 2013-10-30 VITALS — BP 144/96 | HR 83 | Temp 98.1°F | Resp 18 | Wt 270.6 lb

## 2013-10-30 DIAGNOSIS — R1110 Vomiting, unspecified: Secondary | ICD-10-CM

## 2013-10-30 DIAGNOSIS — C099 Malignant neoplasm of tonsil, unspecified: Secondary | ICD-10-CM

## 2013-10-30 DIAGNOSIS — R112 Nausea with vomiting, unspecified: Secondary | ICD-10-CM

## 2013-10-30 HISTORY — DX: Personal history of irradiation: Z92.3

## 2013-10-30 HISTORY — DX: Personal history of antineoplastic chemotherapy: Z92.21

## 2013-10-30 MED ORDER — SODIUM CHLORIDE 0.9 % IV SOLN
Freq: Every day | INTRAVENOUS | Status: DC | PRN
  Administered 2013-10-30: 13:00:00 via INTRAVENOUS

## 2013-10-30 MED ORDER — SODIUM CHLORIDE 0.9 % IJ SOLN
10.0000 mL | INTRAMUSCULAR | Status: DC | PRN
Start: 2013-10-30 — End: 2013-10-30
  Administered 2013-10-30: 10 mL
  Filled 2013-10-30: qty 10

## 2013-10-30 MED ORDER — HEPARIN SOD (PORK) LOCK FLUSH 100 UNIT/ML IV SOLN
500.0000 [IU] | Freq: Once | INTRAVENOUS | Status: AC | PRN
  Administered 2013-10-30: 500 [IU]
  Filled 2013-10-30: qty 5

## 2013-10-30 MED ORDER — PROMETHAZINE HCL 25 MG/ML IJ SOLN
25.0000 mg | Freq: Every day | INTRAMUSCULAR | Status: DC | PRN
  Administered 2013-10-30: 25 mg via INTRAVENOUS
  Filled 2013-10-30: qty 1

## 2013-10-30 NOTE — Progress Notes (Signed)
Radiation Oncology         [phone] ________________________________  Name: Mason [name] MRN: [id number]  Date: 10/30/2013  DOB: 1965-07-26  Follow-Up Visit Note  CC: Chari Manning, NP  [name], MD  Diagnosis and Prior Radiotherapy:   [id number] Right Tonsil Squamous cell carcinoma, Stage IVA  Indication for treatment: Curative with chemotherapy  Radiation treatment dates: 08/19/2013-10/15/2013  Site/dose: Right Tonstil and bilateral neck / 70 Gy in 35 fractions to gross disease, 63 Gy in 35 fractions to high risk nodal echelons, and 56 Gy in 35 fractions to intermediate risk nodal echelons   Narrative:  The patient returns today for routine follow-up.  Denies pain. Last episode of emesis was Monday Weight gain of 4 lbs since 10/23/13 due to following advice of [employer]. Scheduled for iv fluids today.Continues to apply biafine to hyperpigmented skin but not regularly.Peg site looks good, unremarkable per nursing. Thick saliva is better.                          ALLERGIES:  is allergic to pollen extract.  Meds: Current Outpatient Prescriptions  Medication Sig Dispense Refill  . Alum & Mag Hydroxide-Simeth (MAGIC MOUTHWASH W/LIDOCAINE) SOLN Swish and swallow 10 mLs 4 (four) times daily. Take 30 minutes before meals and at bedtime.      Marland Kitchen amLODipine (NORVASC) 10 MG tablet Take 1 tablet (10 mg total) by mouth daily.  30 tablet  3  . chlorhexidine (PERIDEX) 0.12 % solution Use as directed 15 mLs in the mouth or throat 2 (two) times daily. Use for 30 seconds after breakfast and dinner. Spit out excess. Do not swallow.      Marland Kitchen emollient (BIAFINE) cream Apply 1 application topically 2 (two) times daily. Apply to affected skin area afterrad and bedtime daily and on weekends daily      . guaiFENesin (MUCINEX) 600 MG 12 hr tablet Take 2 tablets (1,200 mg total) by mouth 2 (two) times daily.  60 tablet  1  . hydrALAZINE (APRESOLINE) 50 MG tablet Take 1 tablet (50 mg total) by mouth every 8  (eight) hours.  90 tablet  1  . lidocaine-prilocaine (EMLA) cream Apply 1 application topically as needed (For port-a-cath.).      Marland Kitchen LORazepam (ATIVAN) 1 MG tablet Take 1 tablet (1 mg total) by mouth every 8 (eight) hours.  30 tablet  1  . metoprolol (LOPRESSOR) 50 MG tablet Take 1 tablet (50 mg total) by mouth 2 (two) times daily.  60 tablet  1  . Nutritional Supplements (FEEDING SUPPLEMENT, OSMOLITE 1.5 CAL,) LIQD Increase Osmolite 1.5 to goal of 2 cans QID via feeding tube with 60 ml free water before and after each bolus feeding.  Drink or put 300 ml free water in tube TID.  1920 mL  0  . oxyCODONE (ROXICODONE) 15 MG immediate release tablet Take 1 tablet (15 mg total) by mouth every 3 (three) hours as needed.  90 tablet  0  . polyethylene glycol (MIRALAX / GLYCOLAX) packet Take 17 g by mouth daily.  30 each  0  . prochlorperazine (COMPAZINE) 10 MG tablet Take 1 tablet (10 mg total) by mouth every 6 (six) hours as needed (Nausea or vomiting).  30 tablet  2  . sodium fluoride (FLUORISHIELD) 1.1 % GEL dental gel Place 1 drop onto teeth at bedtime. Instill one drop into each tooth space of fluoride tray. Place over teeth for 5 minutes. Remove. Spit  out excess. Repeat nightly.      . docusate sodium 100 MG CAPS Take 200 mg by mouth 2 (two) times daily.  60 capsule  1  . lactulose (CHRONULAC) 10 GM/15ML solution Take 45 mLs (30 g total) by mouth 2 (two) times daily.  500 mL  2  . sucralfate (CARAFATE) 1 G tablet Take 1 g by mouth 4 (four) times daily as needed (Dissolve one tablet in 21ml of water and swallow for sore throat.).       No current facility-administered medications for this encounter.   Facility-Administered Medications Ordered in Other Encounters  Medication Dose Route Frequency Provider Last Rate Last Dose  . 0.9 %  sodium chloride infusion   Intravenous Daily PRN [name], MD      . promethazine (PHENERGAN) injection 25 mg  25 mg Intravenous Daily PRN [name], MD   25 mg at  10/30/13 1325  . sodium chloride 0.9 % injection 10 mL  10 mL Intracatheter PRN [name], MD   10 mL at 10/30/13 1534    Physical Findings: The patient is in no acute distress. Patient is alert and oriented.  weight is 270 lb 9.6 oz (122.743 kg). His temperature is 98.1 F (36.7 C). His blood pressure is 144/96 and his pulse is 83. His respiration is 18 and oxygen saturation is 94%. .  Oropharynx - thick saliva, cannot appreciate any tumor.  No thrush. Neck - persistent thickening/mass in R cervical region level 2/3. could be scar tissue from prior adenopathy.   Lab Findings: Lab Results  Component Value Date   WBC 6.2 10/23/2013   HGB 10.8* 10/23/2013   HCT 33.3* 10/23/2013   MCV 90.2 10/23/2013   PLT 269 10/23/2013    Lab Results  Component Value Date   TSH 1.304 07/24/2013    Radiographic Findings: No results found.  Impression/Plan:    1) Head and Neck Cancer Status: Healing from RT  2) Nutritional Status: - weight:improving - PEG tube:still using  3) Risk Factors: The patient has been educated about risk factors including alcohol and tobacco abuse; they understand that avoidance of alcohol and tobacco is important to prevent recurrences as well as other cancers  4) Swallowing: continue exercises as tolerated  5) Dental: Encouraged to continue regular followup with dentistry, and dental hygiene including fluoride rinses.  appt on 7-28  6) Energy: will screen TSH q 6-40mo  7) Social: No active social issues to address at this time  8) Other:IV fluids per med/onc.  Gagging/heaving continues (happening after oropharyngeal exam today) but improving. Thick saliva improving. Advised to apply Biafine TID to improve skin (patient not compliant with this).  9) Follow-up in 1 month. The patient was encouraged to call with any issues or questions before then.   _____________________________________   Eppie Gibson, MD

## 2013-10-30 NOTE — Patient Instructions (Signed)

## 2013-10-30 NOTE — Progress Notes (Signed)
Patient here for routine 2 week follow up completion of radiation of right tonsil and bilateral neck.Denies pain.Last episode of emesis was Monday.Patient has thick yellow/green productive mucous that is visible on roof of mouth.Weight gain of 4 lbs since 10/23/13.Scheduled for iv fluids today.Continues to apply biafine to hyperpigmented skin.Peg site looks good, unremarkable.

## 2013-10-31 ENCOUNTER — Other Ambulatory Visit: Payer: Self-pay | Admitting: *Deleted

## 2013-10-31 ENCOUNTER — Ambulatory Visit (HOSPITAL_BASED_OUTPATIENT_CLINIC_OR_DEPARTMENT_OTHER): Payer: Medicaid Other

## 2013-10-31 VITALS — BP 145/85 | HR 76 | Temp 98.1°F | Resp 18

## 2013-10-31 DIAGNOSIS — R112 Nausea with vomiting, unspecified: Secondary | ICD-10-CM

## 2013-10-31 DIAGNOSIS — R1110 Vomiting, unspecified: Secondary | ICD-10-CM

## 2013-10-31 DIAGNOSIS — C099 Malignant neoplasm of tonsil, unspecified: Secondary | ICD-10-CM

## 2013-10-31 DIAGNOSIS — N179 Acute kidney failure, unspecified: Secondary | ICD-10-CM

## 2013-10-31 MED ORDER — HEPARIN SOD (PORK) LOCK FLUSH 100 UNIT/ML IV SOLN
500.0000 [IU] | Freq: Once | INTRAVENOUS | Status: AC | PRN
  Administered 2013-10-31: 500 [IU]
  Filled 2013-10-31: qty 5

## 2013-10-31 MED ORDER — SODIUM CHLORIDE 0.9 % IV SOLN
750.0000 mL/h | Freq: Every day | INTRAVENOUS | Status: DC | PRN
Start: 2013-10-31 — End: 2013-10-31
  Administered 2013-10-31: 15:00:00 via INTRAVENOUS

## 2013-10-31 MED ORDER — SODIUM CHLORIDE 0.9 % IJ SOLN
10.0000 mL | INTRAMUSCULAR | Status: DC | PRN
  Administered 2013-10-31: 10 mL
  Filled 2013-10-31: qty 10

## 2013-10-31 NOTE — Patient Instructions (Signed)
Dehydration, Adult Dehydration is when you lose more fluids from the body than you take in. Vital organs like the kidneys, brain, and heart cannot function without a proper amount of fluids and salt. Any loss of fluids from the body can cause dehydration.  CAUSES   Vomiting.  Diarrhea.  Excessive sweating.  Excessive urine output.  Fever. SYMPTOMS  Mild dehydration  Thirst.  Dry lips.  Slightly dry mouth. Moderate dehydration  Very dry mouth.  Sunken eyes.  Skin does not bounce back quickly when lightly pinched and released.  Dark urine and decreased urine production.  Decreased tear production.  Headache. Severe dehydration  Very dry mouth.  Extreme thirst.  Rapid, weak pulse (more than 100 beats per minute at rest).  Cold hands and feet.  Not able to sweat in spite of heat and temperature.  Rapid breathing.  Blue lips.  Confusion and lethargy.  Difficulty being awakened.  Minimal urine production.  No tears. DIAGNOSIS  Your caregiver will diagnose dehydration based on your symptoms and your exam. Blood and urine tests will help confirm the diagnosis. The diagnostic evaluation should also identify the cause of dehydration. TREATMENT  Treatment of mild or moderate dehydration can often be done at home by increasing the amount of fluids that you drink. It is best to drink small amounts of fluid more often. Drinking too much at one time can make vomiting worse. Refer to the home care instructions below. Severe dehydration needs to be treated at the hospital where you will probably be given intravenous (IV) fluids that contain water and electrolytes. HOME CARE INSTRUCTIONS   Ask your caregiver about specific rehydration instructions.  Drink enough fluids to keep your urine clear or pale yellow.  Drink small amounts frequently if you have nausea and vomiting.  Eat as you normally do.  Avoid:  Foods or drinks high in sugar.  Carbonated  drinks.  Juice.  Extremely hot or cold fluids.  Drinks with caffeine.  Fatty, greasy foods.  Alcohol.  Tobacco.  Overeating.  Gelatin desserts.  Wash your hands well to avoid spreading bacteria and viruses.  Only take over-the-counter or prescription medicines for pain, discomfort, or fever as directed by your caregiver.  Ask your caregiver if you should continue all prescribed and over-the-counter medicines.  Keep all follow-up appointments with your caregiver. SEEK MEDICAL CARE IF:  You have abdominal pain and it increases or stays in one area (localizes).  You have a rash, stiff neck, or severe headache.  You are irritable, sleepy, or difficult to awaken.  You are weak, dizzy, or extremely thirsty. SEEK IMMEDIATE MEDICAL CARE IF:   You are unable to keep fluids down or you get worse despite treatment.  You have frequent episodes of vomiting or diarrhea.  You have blood or green matter (bile) in your vomit.  You have blood in your stool or your stool looks black and tarry.  You have not urinated in 6 to 8 hours, or you have only urinated a small amount of very dark urine.  You have a fever.  You faint. MAKE SURE YOU:   Understand these instructions.  Will watch your condition.  Will get help right away if you are not doing well or get worse. Document Released: 04/04/2005 Document Revised: 06/27/2011 Document Reviewed: 11/22/2010 ExitCare Patient Information 2015 [employer]. This information is not intended to replace advice given to you by your health care provider. Make sure you discuss any questions you have with your health care   provider.  

## 2013-11-01 ENCOUNTER — Ambulatory Visit (HOSPITAL_BASED_OUTPATIENT_CLINIC_OR_DEPARTMENT_OTHER): Payer: Medicaid Other

## 2013-11-01 VITALS — BP 162/101 | HR 90 | Temp 98.5°F | Resp 18

## 2013-11-01 DIAGNOSIS — C099 Malignant neoplasm of tonsil, unspecified: Secondary | ICD-10-CM

## 2013-11-01 DIAGNOSIS — R112 Nausea with vomiting, unspecified: Secondary | ICD-10-CM

## 2013-11-01 DIAGNOSIS — R1110 Vomiting, unspecified: Secondary | ICD-10-CM

## 2013-11-01 MED ORDER — HEPARIN SOD (PORK) LOCK FLUSH 100 UNIT/ML IV SOLN
500.0000 [IU] | Freq: Once | INTRAVENOUS | Status: AC | PRN
  Administered 2013-11-01: 500 [IU]
  Filled 2013-11-01: qty 5

## 2013-11-01 MED ORDER — SODIUM CHLORIDE 0.9 % IJ SOLN
10.0000 mL | INTRAMUSCULAR | Status: DC | PRN
  Administered 2013-11-01: 10 mL
  Filled 2013-11-01: qty 10

## 2013-11-01 MED ORDER — LORAZEPAM 1 MG PO TABS: 1.0000 mg | ORAL_TABLET | Freq: Every day | ORAL | Status: DC | PRN

## 2013-11-01 MED ORDER — DEXAMETHASONE SODIUM PHOSPHATE 10 MG/ML IJ SOLN
INTRAMUSCULAR | Status: AC
  Filled 2013-11-01: qty 1

## 2013-11-01 MED ORDER — DEXAMETHASONE SODIUM PHOSPHATE 20 MG/5ML IJ SOLN
INTRAMUSCULAR | Status: AC
  Filled 2013-11-01: qty 5

## 2013-11-01 MED ORDER — ONDANSETRON 8 MG/50ML IVPB (CHCC)
8.0000 mg | Freq: Every day | INTRAVENOUS | Status: DC | PRN
  Administered 2013-11-01: 8 mg via INTRAVENOUS

## 2013-11-01 MED ORDER — PROMETHAZINE HCL 25 MG/ML IJ SOLN
25.0000 mg | Freq: Every day | INTRAMUSCULAR | Status: DC | PRN
  Administered 2013-11-01: 25 mg via INTRAVENOUS
  Filled 2013-11-01: qty 1

## 2013-11-01 MED ORDER — SODIUM CHLORIDE 0.9 % IV SOLN
1500.0000 mL | Freq: Every day | INTRAVENOUS | Status: DC | PRN
  Administered 2013-11-01: 1500 mL via INTRAVENOUS

## 2013-11-01 MED ORDER — DEXAMETHASONE SODIUM PHOSPHATE 10 MG/ML IJ SOLN
10.0000 mg | Freq: Every day | INTRAMUSCULAR | Status: DC | PRN
  Administered 2013-11-01: 10 mg via INTRAVENOUS

## 2013-11-01 NOTE — Patient Instructions (Signed)
Dehydration, Adult °Dehydration means your body does not have as much fluid as it needs. Your kidneys, brain, and heart will not work properly without the right amount of fluids and salt.  °HOME CARE °· Ask your doctor how to replace body fluid losses (rehydrate). °· Drink enough fluids to keep your pee (urine) clear or pale yellow. °· Drink small amounts of fluids often if you feel sick to your stomach (nauseous) or throw up (vomit). °· Eat like you normally do. °· Avoid: °¨ Foods or drinks high in sugar. °¨ Bubbly (carbonated) drinks. °¨ Juice. °¨ Very hot or cold fluids. °¨ Drinks with caffeine. °¨ Fatty, greasy foods. °¨ Alcohol. °¨ Tobacco. °¨ Eating too much. °¨ Gelatin desserts. °· Wash your hands to avoid spreading germs (bacteria, viruses). °· Only take medicine as told by your doctor. °· Keep all doctor visits as told. °GET HELP RIGHT AWAY IF:  °· You cannot drink something without throwing up. °· You get worse even with treatment. °· Your vomit has blood in it or looks greenish. °· Your poop (stool) has blood in it or looks black and tarry. °· You have not peed in 6 to 8 hours. °· You pee a small amount of very dark pee. °· You have a fever. °· You pass out (faint). °· You have belly (abdominal) pain that gets worse or stays in one spot (localizes). °· You have a rash, stiff neck, or bad headache. °· You get easily annoyed, sleepy, or are hard to wake up. °· You feel weak, dizzy, or very thirsty. °MAKE SURE YOU:  °· Understand these instructions. °· Will watch your condition. °· Will get help right away if you are not doing well or get worse. °Document Released: 01/29/2009 Document Revised: 06/27/2011 Document Reviewed: 11/22/2010 °ExitCare® Patient Information ©2015 [employer]. This information is not intended to replace advice given to you by your health care provider. Make sure you discuss any questions you have with your health care provider. ° °Nausea and Vomiting °Nausea means you feel sick to  your stomach. Throwing up (vomiting) is a reflex where stomach contents come out of your mouth. °HOME CARE  °· Take medicine as told by your doctor. °· Do not force yourself to eat. However, you do need to drink fluids. °· If you feel like eating, eat a normal diet as told by your doctor. °¨ Eat rice, wheat, potatoes, bread, lean meats, yogurt, fruits, and vegetables. °¨ Avoid high-fat foods. °· Drink enough fluids to keep your pee (urine) clear or pale yellow. °· Ask your doctor how to replace body fluid losses (rehydrate). Signs of body fluid loss (dehydration) include: °¨ Feeling very thirsty. °¨ Dry lips and mouth. °¨ Feeling dizzy. °¨ Dark pee. °¨ Peeing less than normal. °¨ Feeling confused. °¨ Fast breathing or heart rate. °GET HELP RIGHT AWAY IF:  °· You have blood in your throw up. °· You have black or bloody poop (stool). °· You have a bad headache or stiff neck. °· You feel confused. °· You have bad belly (abdominal) pain. °· You have chest pain or trouble breathing. °· You do not pee at least once every 8 hours. °· You have cold, clammy skin. °· You keep throwing up after 24 to 48 hours. °· You have a fever. °MAKE SURE YOU:  °· Understand these instructions. °· Will watch your condition. °· Will get help right away if you are not doing well or get worse. °Document Released: 09/21/2007 Document Revised: 06/27/2011   Document Reviewed: 09/03/2010 °ExitCare® Patient Information ©2015 [employer]. This information is not intended to replace advice given to you by your health care provider. Make sure you discuss any questions you have with your health care provider. ° °

## 2013-11-02 ENCOUNTER — Ambulatory Visit: Payer: Self-pay

## 2013-11-07 ENCOUNTER — Ambulatory Visit (HOSPITAL_BASED_OUTPATIENT_CLINIC_OR_DEPARTMENT_OTHER): Payer: Medicaid Other | Admitting: Hematology and Oncology

## 2013-11-07 ENCOUNTER — Encounter: Payer: Self-pay | Admitting: *Deleted

## 2013-11-07 ENCOUNTER — Telehealth: Payer: Self-pay | Admitting: Hematology and Oncology

## 2013-11-07 ENCOUNTER — Encounter: Payer: Self-pay | Admitting: Hematology and Oncology

## 2013-11-07 VITALS — BP 142/91 | HR 82 | Temp 98.3°F | Resp 18 | Ht 79.0 in | Wt 260.5 lb

## 2013-11-07 DIAGNOSIS — E860 Dehydration: Secondary | ICD-10-CM

## 2013-11-07 DIAGNOSIS — R1115 Cyclical vomiting syndrome unrelated to migraine: Secondary | ICD-10-CM

## 2013-11-07 DIAGNOSIS — R071 Chest pain on breathing: Secondary | ICD-10-CM

## 2013-11-07 DIAGNOSIS — E43 Unspecified severe protein-calorie malnutrition: Secondary | ICD-10-CM

## 2013-11-07 DIAGNOSIS — C099 Malignant neoplasm of tonsil, unspecified: Secondary | ICD-10-CM

## 2013-11-07 DIAGNOSIS — R1110 Vomiting, unspecified: Secondary | ICD-10-CM

## 2013-11-07 DIAGNOSIS — R0789 Other chest pain: Secondary | ICD-10-CM

## 2013-11-07 MED ORDER — OXYCODONE HCL 10 MG PO TABS: 10.0000 mg | ORAL_TABLET | ORAL | Status: DC | PRN

## 2013-11-07 NOTE — Assessment & Plan Note (Signed)
Clinically, he is doing better with oral fluid intake. I have discontinued IV fluids and encourage oral intake instead.

## 2013-11-07 NOTE — Assessment & Plan Note (Addendum)
Overall, he is feeling well. I will continue aggressive supportive care. Plan for PET/CT scan at the end of October.

## 2013-11-07 NOTE — Progress Notes (Signed)
[facility] OFFICE PROGRESS NOTE  Patient Care Team: Lance Bosch, NP as PCP - General (Internal Medicine) [name] Patient (General Practice) Brooks Sailors, RN as Registered Nurse (Oncology) [name], MD as Referring Physician (Otolaryngology) [name], MD as Consulting Physician (Hematology and Oncology)  SUMMARY OF ONCOLOGIC HISTORY: Oncology History   Tonsillar cancer, HPV positive   Primary site: Pharynx - Oropharynx (Right)   Staging method: AJCC 7th Edition   Clinical: Stage IVA (T2, N2b, M0) signed by [name], MD on 08/19/2013  1:24 PM   Summary: Stage IVA (T2, N2b, M0)       Tonsillar cancer   07/09/2013 Procedure 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 FNA of right level III lymph node was inconclusive for cancer   07/25/2013 Imaging PET scan showed locally advanced disease with abnormal lymphadenopathy in the right axilla   08/06/2013 Procedure CT-guided biopsy of the lymphadenopathy was negative for malignancy   08/15/2013 Surgery Patient has placement of port and feeding tube   08/19/2013 - 09/10/2013 Chemotherapy Patient received chemotherapy with cisplatin. The patient only received 2 doses due to uncontrolled nausea and acute renal failure.   08/19/2013 - 10/15/2013 Radiation Therapy Patient received radiation treatment   08/27/2013 - 08/30/2013 Hospital Admission The patient was admitted to the hospital for uncontrolled nausea vomiting and dehydration.    INTERVAL HISTORY: Please see below for problem oriented charting. Overall, he is feeling better. He denies uncontrolled nausea or vomiting. He is dependent on laxatives for regular bowel movement. His pain is better control and he is off fentanyl patch. He had lost 6 pounds since I saw him but the patient is motivated to try to eat by mouth.  REVIEW OF SYSTEMS:   Constitutional: Denies fevers, chills  Eyes: Denies blurriness of  vision Cardiovascular: Denies palpitation, chest discomfort or lower extremity swelling Skin: Denies abnormal skin rashes Lymphatics: Denies new lymphadenopathy or easy bruising Neurological:Denies numbness, tingling or new weaknesses Behavioral/Psych: Mood is stable, no new changes  All other systems were reviewed with the patient and are negative.  I have reviewed the past medical history, past surgical history, social history and family history with the patient and they are unchanged from previous note.  ALLERGIES:  is allergic to pollen extract.  MEDICATIONS:  Current Outpatient Prescriptions  Medication Sig Dispense Refill  . Alum & Mag Hydroxide-Simeth (MAGIC MOUTHWASH W/LIDOCAINE) SOLN Swish and swallow 10 mLs 4 (four) times daily. Take 30 minutes before meals and at bedtime.      Marland Kitchen amLODipine (NORVASC) 10 MG tablet Take 1 tablet (10 mg total) by mouth daily.  30 tablet  3  . chlorhexidine (PERIDEX) 0.12 % solution Use as directed 15 mLs in the mouth or throat 2 (two) times daily. Use for 30 seconds after breakfast and dinner. Spit out excess. Do not swallow.      . docusate sodium 100 MG CAPS Take 200 mg by mouth 2 (two) times daily.  60 capsule  1  . emollient (BIAFINE) cream Apply 1 application topically 2 (two) times daily. Apply to affected skin area afterrad and bedtime daily and on weekends daily      . guaiFENesin (MUCINEX) 600 MG 12 hr tablet Take 2 tablets (1,200 mg total) by mouth 2 (two) times daily.  60 tablet  1  . hydrALAZINE (APRESOLINE) 50 MG tablet Take 1 tablet (50 mg total) by mouth every 8 (eight) hours.  90 tablet  1  .  lactulose (CHRONULAC) 10 GM/15ML solution Take 45 mLs (30 g total) by mouth 2 (two) times daily.  500 mL  2  . lidocaine-prilocaine (EMLA) cream Apply 1 application topically as needed (For port-a-cath.).      Marland Kitchen LORazepam (ATIVAN) 1 MG tablet Take 1 tablet (1 mg total) by mouth every 8 (eight) hours.  30 tablet  1  . metoprolol (LOPRESSOR) 50 MG  tablet Take 1 tablet (50 mg total) by mouth 2 (two) times daily.  60 tablet  1  . Nutritional Supplements (FEEDING SUPPLEMENT, OSMOLITE 1.5 CAL,) LIQD Increase Osmolite 1.5 to goal of 2 cans QID via feeding tube with 60 ml free water before and after each bolus feeding.  Drink or put 300 ml free water in tube TID.  1920 mL  0  . oxyCODONE (ROXICODONE) 15 MG immediate release tablet Take 1 tablet (15 mg total) by mouth every 3 (three) hours as needed.  90 tablet  0  . polyethylene glycol (MIRALAX / GLYCOLAX) packet Take 17 g by mouth daily.  30 each  0  . prochlorperazine (COMPAZINE) 10 MG tablet Take 1 tablet (10 mg total) by mouth every 6 (six) hours as needed (Nausea or vomiting).  30 tablet  2  . sodium fluoride (FLUORISHIELD) 1.1 % GEL dental gel Place 1 drop onto teeth at bedtime. Instill one drop into each tooth space of fluoride tray. Place over teeth for 5 minutes. Remove. Spit out excess. Repeat nightly.      . sucralfate (CARAFATE) 1 G tablet Take 1 g by mouth 4 (four) times daily as needed (Dissolve one tablet in 61ml of water and swallow for sore throat.).      Marland Kitchen oxyCODONE 10 MG TABS Take 1 tablet (10 mg total) by mouth every 4 (four) hours as needed for severe pain.  60 tablet  0   No current facility-administered medications for this visit.    PHYSICAL EXAMINATION: ECOG PERFORMANCE STATUS: 1 - Symptomatic but completely ambulatory  Filed Vitals:   11/07/13 1504  BP: 142/91  Pulse: 82  Temp: 98.3 F (36.8 C)  Resp: 18   Filed Weights   11/07/13 1504  Weight: 260 lb 8 oz (118.162 kg)    GENERAL:alert, no distress and comfortable SKIN: skin color, texture, turgor are normal, no rashes or significant lesions EYES: normal, Conjunctiva are pink and non-injected, sclera clear OROPHARYNX:no exudate, no erythema and lips, buccal mucosa, and tongue normal . No thrush NECK: supple, thyroid normal size, non-tender, without nodularity LYMPH:  Persistent palpable lymphadenopathy on  the right side of the neck. LUNGS: clear to auscultation and percussion with normal breathing effort HEART: regular rate & rhythm and no murmurs and no lower extremity edema ABDOMEN:abdomen soft, non-tender and normal bowel sounds. Feeding tube site looks okay Musculoskeletal:no cyanosis of digits and no clubbing  NEURO: alert & oriented x 3 with fluent speech, no focal motor/sensory deficits  LABORATORY DATA:  I have reviewed the data as listed    Component Value Date/Time   NA 137 10/23/2013 1244   NA 132* 09/30/2013 0522   K 3.9 10/23/2013 1244   K 4.9 09/30/2013 0522   CL 93* 09/30/2013 0522   CO2 26 10/23/2013 1244   CO2 26 09/30/2013 0522   GLUCOSE 83 10/23/2013 1244   GLUCOSE 124* 09/30/2013 0522   BUN 12.1 10/23/2013 1244   BUN 24* 09/30/2013 0522   CREATININE 1.2 10/23/2013 1244   CREATININE 1.27 09/30/2013 0522   CALCIUM 9.4 10/23/2013 1244  CALCIUM 9.4 09/30/2013 0522   PROT 6.7 10/23/2013 1244   PROT 7.5 09/30/2013 0522   ALBUMIN 3.4* 10/23/2013 1244   ALBUMIN 3.4* 09/30/2013 0522   AST 24 10/23/2013 1244   AST 24 09/30/2013 0522   ALT 29 10/23/2013 1244   ALT 43 09/30/2013 0522   ALKPHOS 85 10/23/2013 1244   ALKPHOS 101 09/30/2013 0522   BILITOT 0.41 10/23/2013 1244   BILITOT 0.3 09/30/2013 0522   GFRNONAA 66* 09/30/2013 0522   GFRAA 77* 09/30/2013 0522    No results found for this basename: SPEP, UPEP,  kappa and lambda light chains    Lab Results  Component Value Date   WBC 6.2 10/23/2013   NEUTROABS 5.1 10/23/2013   HGB 10.8* 10/23/2013   HCT 33.3* 10/23/2013   MCV 90.2 10/23/2013   PLT 269 10/23/2013      Chemistry      Component Value Date/Time   NA 137 10/23/2013 1244   NA 132* 09/30/2013 0522   K 3.9 10/23/2013 1244   K 4.9 09/30/2013 0522   CL 93* 09/30/2013 0522   CO2 26 10/23/2013 1244   CO2 26 09/30/2013 0522   BUN 12.1 10/23/2013 1244   BUN 24* 09/30/2013 0522   CREATININE 1.2 10/23/2013 1244   CREATININE 1.27 09/30/2013 0522      Component Value Date/Time   CALCIUM 9.4 10/23/2013 1244    CALCIUM 9.4 09/30/2013 0522   ALKPHOS 85 10/23/2013 1244   ALKPHOS 101 09/30/2013 0522   AST 24 10/23/2013 1244   AST 24 09/30/2013 0522   ALT 29 10/23/2013 1244   ALT 43 09/30/2013 0522   BILITOT 0.41 10/23/2013 1244   BILITOT 0.3 09/30/2013 0522      ASSESSMENT & PLAN:  Tonsillar cancer Overall, he is feeling well. I will continue aggressive supportive care. Plan for PET/CT scan at the end of October.   Protein-calorie malnutrition, severe He has lost 6 more pounds of weight since I saw him but he is not able to tolerate 6-8 cans of nutritional supplement. I continue to encourage him to continue the same.  Nausea & vomiting This had improved significantly since the last time I saw him. He is not using regular antiemetics anymore.  Dehydration Clinically, he is doing better with oral fluid intake. I have discontinued IV fluids and encourage oral intake instead.  Chest wall pain This has nearly resolved. He is on fentanyl patch. I will initiate taper off oxycodone.   No orders of the defined types were placed in this encounter.   All questions were answered. The patient knows to call the clinic with any problems, questions or concerns. No barriers to learning was detected. I spent 25 minutes counseling the patient face to face. The total time spent in the appointment was 30 minutes and more than 50% was on counseling and review of test results     [facility], [place], MD 11/07/2013 4:03 PM

## 2013-11-07 NOTE — Telephone Encounter (Signed)
Pt confirmed labs/ov per 07/23 POF, gave pt AVS.....KJ °

## 2013-11-07 NOTE — Assessment & Plan Note (Signed)
This has nearly resolved. He is on fentanyl patch. I will initiate taper off oxycodone.

## 2013-11-07 NOTE — Assessment & Plan Note (Signed)
He has lost 6 more pounds of weight since I saw him but he is not able to tolerate 6-8 cans of nutritional supplement. I continue to encourage him to continue the same.

## 2013-11-07 NOTE — Assessment & Plan Note (Addendum)
This had improved significantly since the last time I saw him. He is not using regular antiemetics anymore.

## 2013-11-08 NOTE — Progress Notes (Signed)
To provide support, encouragement and care continuity, met with patient and his SO during est pt appt with Dr. Alvy Bimler.  Mason [name] expressed frustration with weight loss despite 6-7 can instillation of nutritional supplement.  He reported some oral hydration but hesitation to try solid foods for fear of N&V.  I encouraged him to try various soft foods, to keep a food diary, to continue with foods well tolerated, revisit those that are not.  He reports minimal throat pain or other swallowing issues.  Thickened saliva is reducing, I encouraged him to continue with salt/baking soda rinses multiple times daily.  I encouraged him to continued BID swallowing exercises.  I acknowledged his notable improvement since completing RT and his progress with s/p chemo ivf; he was able to recognize same.  Continuing to navigate as L3 (treatments completed) patient.  Gayleen Orem, RN, BSN, [facility] Head & Neck Oncology Navigator [phone]

## 2013-11-12 ENCOUNTER — Ambulatory Visit (HOSPITAL_COMMUNITY): Payer: Self-pay | Admitting: Dentistry

## 2013-11-14 ENCOUNTER — Ambulatory Visit (HOSPITAL_COMMUNITY): Payer: Medicaid - Dental | Admitting: Dentistry

## 2013-11-14 ENCOUNTER — Encounter (HOSPITAL_COMMUNITY): Payer: Self-pay | Admitting: Dentistry

## 2013-11-14 VITALS — BP 115/76 | HR 85 | Temp 97.6°F | Wt 265.0 lb

## 2013-11-14 DIAGNOSIS — R1310 Dysphagia, unspecified: Secondary | ICD-10-CM

## 2013-11-14 DIAGNOSIS — R439 Unspecified disturbances of smell and taste: Secondary | ICD-10-CM

## 2013-11-14 DIAGNOSIS — K117 Disturbances of salivary secretion: Secondary | ICD-10-CM

## 2013-11-14 DIAGNOSIS — R110 Nausea: Secondary | ICD-10-CM

## 2013-11-14 DIAGNOSIS — K08109 Complete loss of teeth, unspecified cause, unspecified class: Secondary | ICD-10-CM

## 2013-11-14 DIAGNOSIS — Z0189 Encounter for other specified special examinations: Secondary | ICD-10-CM

## 2013-11-14 DIAGNOSIS — Z9221 Personal history of antineoplastic chemotherapy: Secondary | ICD-10-CM

## 2013-11-14 DIAGNOSIS — Z923 Personal history of irradiation: Secondary | ICD-10-CM

## 2013-11-14 DIAGNOSIS — C099 Malignant neoplasm of tonsil, unspecified: Secondary | ICD-10-CM

## 2013-11-14 DIAGNOSIS — R432 Parageusia: Secondary | ICD-10-CM

## 2013-11-14 DIAGNOSIS — R682 Dry mouth, unspecified: Secondary | ICD-10-CM

## 2013-11-14 NOTE — Progress Notes (Signed)
11/14/2013  Patient:            Mason [name] Date of Birth:  [birth year] MRN:                [id number]  BP 115/76  Pulse 85  Temp(Src) 97.6 F (36.4 C) (Oral)  Wt 265 lb (120.203 kg)  [name] presents for oral examination after radiation therapy. Patient completed radiation from 08/20/2051 10/15/2013. Patient also has completed chemotherapy.  REVIEW OF CHIEF COMPLAINTS:  DRY MOUTH: Yes HARD TO SWALLOW: Yes, at times. HURT TO SWALLOW: Yes, at times. TASTE CHANGES: Taste is returning slowly. SORES IN MOUTH: None TRISMUS: No problems with trismus WEIGHT: 265 pounds  HOME OH REGIMEN:  BRUSHING: one time a day FLOSSING: once daily RINSING: Using salt water and baking soda rinses. FLUORIDE: Using at bedtime. TRISMUS EXERCISES:  Maximum interincisal opening: 40 mm   DENTAL EXAM:  Oral Hygiene:(PLAQUE): Good oral hygiene. LOCATION OF MUCOSITIS: None noted. DESCRIPTION OF SALIVA: Decreased and foamy saliva. ANY EXPOSED BONE: Area of exposed bone lingual area #18-19 is now a loose bony sequestrum. A 2 mm X 13 mm piece of bone was removed without complications-today. Soft tissue is noted underneath the loose bony sequestrum. OTHER WATCHED AREAS: Other maxillary extraction sites have healed in well by secondary intention. DX: Xerostomia, Dysgeusia, Dysphagia, Odynophagia and Missing teeth  RECOMMENDATIONS: 1. Brush after meals and at bedtime.  Use fluoride at bedtime. 2. Use trismus exercises as directed. 3. Use Biotene Rinse or salt water/baking soda rinses. 4. Multiple sips of water as needed. 5. Make appointment with new primary Dentist in two months for an exam, cleaning and evaluation for other treatment needs. Suggested [facility] as possible dental office to F/U with that takes Medicaid.  [name], DDS

## 2013-11-14 NOTE — Patient Instructions (Addendum)
RRECOMMENDATIONS: 1. Brush after meals and at bedtime.  Use fluoride at bedtime. 2. Use trismus exercises as directed. 3. Use Biotene Rinse or salt water/baking soda rinses. 4. Multiple sips of water as needed. 5. Make appointment with new primary Dentist in two months for an exam, cleaning and evaluation for other treatment needs. Suggested [facility] on [facility] as possible dental office to F/U with that takes Medicaid.  Mason [name], DDS  RADIATION THERAPY AND DECISIONS REGARDING YOUR TEETH  Xerostomia (dry mouth) Your salivary glands may be in the filed of radiation.  Radiation may include all or part of your saliva glands.  This will cause your saliva to dry up and you will have a dry mouth.  The dry mouth will be for the rest of your life unless your radiation oncologist tells you otherwise.  Your saliva has many functions:  Saliva wets your tongue for speaking.  It coats your teeth and the inside of your mouth for easier movement.  It helps with chewing and swallowing food.  It helps clean away harmful acid and toxic products made by the germs in your mouth, therefore it helps prevent cavities.  It kills some germs in your mouth and helps to prevent gum disease.  It helps to carry flavor to your taste buds.  Once you have lost your saliva you will be at higher risk for tooth decay and gum disease.  What can be done to help improve your mouth when there's not enough saliva:  1.  Your dentist may give a prescription for Salagen.  It will not bring back all of your saliva but may bring back some of it.  Also your saliva may be thick and ropy or white and foamy. It will not feel like it use to feel.  2.  You will need to swish with water every time your mouth feels dry.  YOU CANNOT suck on any cough drops, mints, lemon drops, candy, vitamin C or any other products.  You cannot use anything other than water to make your mouth feel less dry.  If you want to drink  anything else you have to drink it all at once and brush afterwards.  Be sure to discuss the details of your diet habits with your dentist or hygienist.  Radiation caries: This is decay that happens very quickly once your mouth is very dry due to radiation therapy.  Normally cavities take six months to two years to become a problem.  When you have dry mouth cavities may take as little as eight weeks to cause you a problem.  This is why dental check ups every two months are necessary as long as you have a dry mouth. Radiation caries typically, but not always, start at your gum line where it is hard to see the cavity.  It is therefore also hard to fill these cavities adequately.  This high rate of cavities happens because your mouth no longer has saliva and therefore the acid made by the germs starts the decay process.  Whenever you eat anything the germs in your mouth change the food into acid.  The acid then burns a small hole in your tooth.  This small hole is the beginning of a cavity.  If this is not treated then it will grow bigger and become a cavity.  The way to avoid this hole getting bigger is to use fluoride every evening as prescribed by your dentist.  You have to make sure  that your teeth are very clean before you use the fluoride.  This fluoride in turn will strengthen your teeth and prepare them for another day of fighting acid.  If you develop radiation caries many times the damage is so large that you will have to have all your teeth removed.  This could be a big problem if some of these teeth are in the field of radiation.  Further details of why this could be a big problem will follow.  (See Osteoradionecrosis).  Loss of taste (dysgeusia) This happens to varying degrees once you've had radiation therapy to your jaw region.  Many times taste is not completely lost but becomes limited.  The loss of taste is mostly due to radiation affecting your taste buds.  However if you have no saliva in  your mouth to carry the flavor to your taste buds it would be difficult for your taste buds to taste anything.  That is why using water or a prescription for Salagen prior to meals and during meals may help with some of the taste.  Keep in mind that taste generally returns very slowly over the course of several months or several years after radiation therapy.  Don't give up hope.  Trismus According to your Radiation Oncologist your TMJ or jaw joints are going to be partially or fully in the field of radiation.  This means that over time the muscles that help you open and close your mouth may get stiff.  This will potentially result in your not being able to open your mouth wide enough or as wide as you can open it now.  Le me give you an example of how slowly this happens and how unaware people are of it.  A gentlemen that had radiation therapy two years ago came back to me complaining that bananas are just too large for him to be able to fit them in between his teeth.  He was not able to open wide enough to bite into a banana.  This happens slowly and over a period of time.  What do we do to try and prevent this?  Your dentist will probably give you a stack of sticks called a trismus exercise device .  This stack will help your remind your muscles and your jaw joint to open up to the same distance every day.  Use these sticks every morning when you wake up according to the instructions given by the dentist.   You must use these sticks for at least one to two years after radiation therapy.  The reason for that is because it happens so slowly and keeps going on for about two years after radiation therapy.  Your hospital dentist will help you monitor your mouth opening and make sure that it's not getting smaller.  Osteoradionecrosis (ORN) This is a condition where your jaw bone after having had radiation therapy becomes very dry.  It has very little blood supply to keep it alive.  If you develop a cavity that  turns into an abscess or an infection then the jaw bone does not have enough blood supply to help fight the infection.  At this point it is very likely that the infection could cause the death of your jaw bone.  When you have dead bone it has to be removed.  Therefore you might end up having to have surgery to remove part of your jaw bone, the part of the jaw bone that has been affected.   Healing  is also a problem if you are to have surgery in the areas where the bone has had radiation therapy.  The same reasons apply.  If you have surgery you need more blood supply which is not available.  When blood supply and oxygen are not available again, there is a chance for the bone to die.  Occasionally ORN happens on its own with no obvious reason.  This is quite rare.  We believe that patients who continue to smoke and/or drink alcohol have a higher chance of having this bone problem.  Therefore once your jaw bone has had radiation therapy if there are any teeth in that area, you should never have them pulled.  You should also never have any surgery on your teeth or gums in that area unless the oral surgeon or Periodontist is aware of your history of radiation. There is some expensive management techniques that might be used to limit your risks.  The risks for ORN either from infection or spontaneous ( or on it's own) are life long.    FLUORIDE TRAYS PATIENT INSTRUCTIONS    Obtain prescription from the pharmacy.  Don't be surprised if it needs to be ordered.  Be sure to let the pharmacy know when you are close to needing a new refill for them to have it ready for you without interruption of Fluoride use.  The best time to use your Fluoride is before bed time.  You must brush your teeth very well and floss before using the Fluoride in order to get the best use out of the Fluoride treatments.  Place 1 drop of Fluoride gel per tooth in the tray.  Place the tray on your lower teeth and your upper teeth.   Make sure the trays are seated all the way.  Remember, they only fit one way on your teeth.  Insert for 5 full minutes.  At the end of the 5 minutes, take the trays out.  SPIT OUT excess. .  Do NOT rinse your mouth!  Do NOT eat or drink after treatments for at least 30 minutes.  This is why the best time for your treatments is before bedtime.  Clean the inside of your Fluoride trays using COLD WATER and a toothbrush.  In order to keep your Trays from discoloring and free from odors, soak them overnight in denture cleaners such as Efferdent.  Do not use bleach or non denture products.  Store the trays in a safe dry place AWAY from any heat until your next treatment.  If anything happens to your Fluoride trays, or they don't fit as well after any dental work, please let us know as soon as possible.  TRISMUS  Trismus is a condition where the jaw does not allow the mouth to open as wide as it usually does.  This can happen almost suddenly, or in other cases the process is so slow, it is hard to notice it-until it is too far along.  When the jaw joints and/or muscles have been exposed to radiation treatments, the onset of Trismus is very slow.  This is because the muscles are losing their stretching ability over a long period of time, as long as 2 YEARS after the end of radiation.  It is therefore important to exercise these muscles and joints.  TRISMUS EXERCISES   Stack of tongue depressors measuring the same or a little less than the last documented MIO (Maximum Interincisal Opening).  Secure them with a rubber band on  both ends.  Place the stack in the patient's mouth, supporting the other end.  Allow 30 seconds for muscle stretching.  Rest for a few seconds.  Repeat 3-5 times  For all radiation patients, this exercise is recommended in the mornings and evenings unless otherwise instructed.  The exercise should be done for a period of 2 YEARS after the end of radiation.  MIO  should be checked routinely on recall dental visits by the general dentist or the hospital dentist.  The patient is advised to report any changes, soreness, or difficulties encountered when doing the exercises.

## 2013-11-16 ENCOUNTER — Other Ambulatory Visit: Payer: Self-pay | Admitting: Hematology and Oncology

## 2013-11-16 DIAGNOSIS — R112 Nausea with vomiting, unspecified: Secondary | ICD-10-CM

## 2013-11-16 DIAGNOSIS — E039 Hypothyroidism, unspecified: Secondary | ICD-10-CM

## 2013-11-18 ENCOUNTER — Ambulatory Visit: Payer: Medicaid Other | Attending: Radiation Oncology

## 2013-11-18 ENCOUNTER — Ambulatory Visit: Payer: Medicaid Other

## 2013-11-18 DIAGNOSIS — R1310 Dysphagia, unspecified: Secondary | ICD-10-CM | POA: Insufficient documentation

## 2013-11-18 DIAGNOSIS — IMO0001 Reserved for inherently not codable concepts without codable children: Secondary | ICD-10-CM | POA: Diagnosis present

## 2013-11-18 DIAGNOSIS — C099 Malignant neoplasm of tonsil, unspecified: Secondary | ICD-10-CM | POA: Diagnosis not present

## 2013-11-22 ENCOUNTER — Ambulatory Visit: Payer: MEDICAID | Admitting: Radiation Oncology

## 2013-11-26 ENCOUNTER — Other Ambulatory Visit (HOSPITAL_BASED_OUTPATIENT_CLINIC_OR_DEPARTMENT_OTHER): Payer: Medicaid Other

## 2013-11-26 ENCOUNTER — Telehealth: Payer: Self-pay | Admitting: Hematology and Oncology

## 2013-11-26 ENCOUNTER — Ambulatory Visit (HOSPITAL_BASED_OUTPATIENT_CLINIC_OR_DEPARTMENT_OTHER): Payer: Medicaid Other | Admitting: Hematology and Oncology

## 2013-11-26 ENCOUNTER — Ambulatory Visit (HOSPITAL_BASED_OUTPATIENT_CLINIC_OR_DEPARTMENT_OTHER): Payer: Medicaid Other

## 2013-11-26 ENCOUNTER — Encounter: Payer: Self-pay | Admitting: Hematology and Oncology

## 2013-11-26 VITALS — BP 127/80 | HR 82 | Temp 98.5°F

## 2013-11-26 VITALS — BP 127/80 | HR 82 | Temp 98.5°F | Resp 18 | Ht 79.0 in | Wt 254.6 lb

## 2013-11-26 DIAGNOSIS — C099 Malignant neoplasm of tonsil, unspecified: Secondary | ICD-10-CM

## 2013-11-26 DIAGNOSIS — E43 Unspecified severe protein-calorie malnutrition: Secondary | ICD-10-CM

## 2013-11-26 DIAGNOSIS — Z931 Gastrostomy status: Secondary | ICD-10-CM

## 2013-11-26 DIAGNOSIS — R112 Nausea with vomiting, unspecified: Secondary | ICD-10-CM

## 2013-11-26 DIAGNOSIS — D701 Agranulocytosis secondary to cancer chemotherapy: Secondary | ICD-10-CM

## 2013-11-26 DIAGNOSIS — R071 Chest pain on breathing: Secondary | ICD-10-CM

## 2013-11-26 DIAGNOSIS — R0789 Other chest pain: Secondary | ICD-10-CM

## 2013-11-26 DIAGNOSIS — D630 Anemia in neoplastic disease: Secondary | ICD-10-CM

## 2013-11-26 DIAGNOSIS — Z95828 Presence of other vascular implants and grafts: Secondary | ICD-10-CM

## 2013-11-26 DIAGNOSIS — T451X5A Adverse effect of antineoplastic and immunosuppressive drugs, initial encounter: Secondary | ICD-10-CM

## 2013-11-26 DIAGNOSIS — D72819 Decreased white blood cell count, unspecified: Secondary | ICD-10-CM

## 2013-11-26 LAB — COMPREHENSIVE METABOLIC PANEL (CC13)
ALBUMIN: 3.9 g/dL (ref 3.5–5.0)
ALT: 21 U/L (ref 0–55)
ANION GAP: 10 meq/L (ref 3–11)
AST: 20 U/L (ref 5–34)
Alkaline Phosphatase: 58 U/L (ref 40–150)
BUN: 20.8 mg/dL (ref 7.0–26.0)
CO2: 28 meq/L (ref 22–29)
CREATININE: 1.5 mg/dL — AB (ref 0.7–1.3)
Calcium: 9.6 mg/dL (ref 8.4–10.4)
Chloride: 103 mEq/L (ref 98–109)
Glucose: 108 mg/dl (ref 70–140)
POTASSIUM: 4.3 meq/L (ref 3.5–5.1)
Sodium: 140 mEq/L (ref 136–145)
Total Bilirubin: 0.43 mg/dL (ref 0.20–1.20)
Total Protein: 6.8 g/dL (ref 6.4–8.3)

## 2013-11-26 LAB — CBC WITH DIFFERENTIAL/PLATELET
BASO%: 0.6 % (ref 0.0–2.0)
BASOS ABS: 0 10*3/uL (ref 0.0–0.1)
EOS%: 4.5 % (ref 0.0–7.0)
Eosinophils Absolute: 0.2 10*3/uL (ref 0.0–0.5)
HEMATOCRIT: 33.8 % — AB (ref 38.4–49.9)
HGB: 10.8 g/dL — ABNORMAL LOW (ref 13.0–17.1)
LYMPH#: 0.3 10*3/uL — AB (ref 0.9–3.3)
LYMPH%: 8.1 % — ABNORMAL LOW (ref 14.0–49.0)
MCH: 28.9 pg (ref 27.2–33.4)
MCHC: 32 g/dL (ref 32.0–36.0)
MCV: 90.2 fL (ref 79.3–98.0)
MONO#: 0.4 10*3/uL (ref 0.1–0.9)
MONO%: 10.7 % (ref 0.0–14.0)
NEUT#: 2.8 10*3/uL (ref 1.5–6.5)
NEUT%: 76.1 % — ABNORMAL HIGH (ref 39.0–75.0)
Platelets: 215 10*3/uL (ref 140–400)
RBC: 3.75 10*6/uL — ABNORMAL LOW (ref 4.20–5.82)
RDW: 16 % — AB (ref 11.0–14.6)
WBC: 3.7 10*3/uL — ABNORMAL LOW (ref 4.0–10.3)

## 2013-11-26 MED ORDER — SODIUM CHLORIDE 0.9 % IJ SOLN
10.0000 mL | INTRAMUSCULAR | Status: DC | PRN
  Administered 2013-11-26: 10 mL via INTRAVENOUS
  Filled 2013-11-26: qty 10

## 2013-11-26 MED ORDER — HEPARIN SOD (PORK) LOCK FLUSH 100 UNIT/ML IV SOLN
500.0000 [IU] | Freq: Once | INTRAVENOUS | Status: AC
  Administered 2013-11-26: 500 [IU] via INTRAVENOUS
  Filled 2013-11-26: qty 5

## 2013-11-26 MED ORDER — OXYCODONE HCL 5 MG PO TABS: 5.0000 mg | ORAL_TABLET | Freq: Four times a day (QID) | ORAL | Status: DC | PRN

## 2013-11-26 NOTE — Assessment & Plan Note (Signed)
He has lost 6 more pounds of weight since I saw him but he is now able to tolerate 6-8 cans of nutritional supplement. I continue to encourage him to continue the same and to increase oral fluid intake.

## 2013-11-26 NOTE — Assessment & Plan Note (Signed)
This is likely anemia of chronic disease. The patient denies recent history of bleeding such as epistaxis, hematuria or hematochezia. He is asymptomatic from the anemia. We will observe for now.  

## 2013-11-26 NOTE — Progress Notes (Signed)
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 FNA of right level III lymph node was inconclusive for cancer   07/25/2013 Imaging PET scan showed locally advanced disease with abnormal lymphadenopathy in the right axilla   08/06/2013 Procedure CT-guided biopsy of the lymphadenopathy was negative for malignancy   08/15/2013 Surgery Patient has placement of port and feeding tube   08/19/2013 - 09/10/2013 Chemotherapy Patient received chemotherapy with cisplatin. The patient only received 2 doses due to uncontrolled nausea and acute renal failure.   08/19/2013 - 10/15/2013 Radiation Therapy Patient received radiation treatment   08/27/2013 - 08/30/2013 Hospital Admission The patient was admitted to the hospital for uncontrolled nausea vomiting and dehydration.    INTERVAL HISTORY: Please see below for problem oriented charting. Overall he is improving. He is able to swallow some liquid. He has lost 6 pounds of weight. He feels well. His pain is minimum.  REVIEW OF SYSTEMS:   Constitutional: Denies fevers, chills  Eyes: Denies blurriness of vision Ears, nose, mouth, throat, and face: Denies mucositis or sore throat Respiratory: Denies cough, dyspnea or wheezes Cardiovascular: Denies palpitation, chest  discomfort or lower extremity swelling Gastrointestinal:  Denies nausea, heartburn or change in bowel habits Skin: Denies abnormal skin rashes Lymphatics: Denies new lymphadenopathy or easy bruising Neurological:Denies numbness, tingling or new weaknesses Behavioral/Psych: Mood is stable, no new changes  All other systems were reviewed with the patient and are negative.  I have reviewed the past medical history, past surgical history, social history and family history with the patient and they are unchanged from previous note.  ALLERGIES:  is allergic to pollen extract.  MEDICATIONS:  Current Outpatient Prescriptions  Medication Sig Dispense Refill  . amLODipine (NORVASC) 10 MG tablet Take 1 tablet (10 mg total) by mouth daily.  30 tablet  3  . chlorhexidine (PERIDEX) 0.12 % solution Use as directed 15 mLs in the mouth or throat 2 (two) times daily. Use for 30 seconds after breakfast and dinner. Spit out excess. Do not swallow.      Marland Kitchen emollient (BIAFINE) cream Apply 1 application topically 2 (two) times daily. Apply to affected skin area afterrad and bedtime daily and on weekends daily      . guaiFENesin (MUCINEX) 600 MG 12 hr tablet Take 2 tablets (1,200 mg total) by mouth 2 (two) times daily.  60 tablet  1  . Nutritional Supplements (FEEDING SUPPLEMENT, OSMOLITE 1.5 CAL,) LIQD Increase Osmolite 1.5 to goal of 2 cans QID via feeding tube with 60 ml free water before and after each bolus feeding.  Drink or put 300 ml free water in tube TID.  1920 mL  0  . polyethylene glycol (MIRALAX / GLYCOLAX) packet Take 17 g by mouth daily.  30 each  0  . sodium fluoride (FLUORISHIELD) 1.1 %  GEL dental gel Place 1 drop onto teeth at bedtime. Instill one drop into each tooth space of fluoride tray. Place over teeth for 5 minutes. Remove. Spit out excess. Repeat nightly.      Marland Kitchen Alum & Mag Hydroxide-Simeth (MAGIC MOUTHWASH W/LIDOCAINE) SOLN Swish and swallow 10 mLs 4 (four) times daily. Take 30 minutes before  meals and at bedtime.      . docusate sodium 100 MG CAPS Take 200 mg by mouth 2 (two) times daily.  60 capsule  1  . hydrALAZINE (APRESOLINE) 50 MG tablet Take 1 tablet (50 mg total) by mouth every 8 (eight) hours.  90 tablet  1  . lactulose (CHRONULAC) 10 GM/15ML solution Take 45 mLs (30 g total) by mouth 2 (two) times daily.  500 mL  2  . lidocaine-prilocaine (EMLA) cream Apply 1 application topically as needed (For port-a-cath.).      Marland Kitchen LORazepam (ATIVAN) 1 MG tablet Take 1 tablet (1 mg total) by mouth every 8 (eight) hours.  30 tablet  1  . metoprolol (LOPRESSOR) 50 MG tablet Take 1 tablet (50 mg total) by mouth 2 (two) times daily.  60 tablet  1  . oxyCODONE (OXY IR/ROXICODONE) 5 MG immediate release tablet Take 1 tablet (5 mg total) by mouth every 6 (six) hours as needed for severe pain.  60 tablet  0  . oxyCODONE 10 MG TABS Take 1 tablet (10 mg total) by mouth every 4 (four) hours as needed for severe pain.  60 tablet  0  . prochlorperazine (COMPAZINE) 10 MG tablet Take 1 tablet (10 mg total) by mouth every 6 (six) hours as needed (Nausea or vomiting).  30 tablet  2  . sucralfate (CARAFATE) 1 G tablet Take 1 g by mouth 4 (four) times daily as needed (Dissolve one tablet in 56ml of water and swallow for sore throat.).       No current facility-administered medications for this visit.    PHYSICAL EXAMINATION: ECOG PERFORMANCE STATUS: 1 - Symptomatic but completely ambulatory  Filed Vitals:   11/26/13 1441  BP: 127/80  Pulse: 82  Temp: 98.5 F (36.9 C)  Resp: 18   Filed Weights   11/26/13 1441  Weight: 254 lb 9.6 oz (115.486 kg)    GENERAL:alert, no distress and comfortable SKIN: skin color, texture, turgor are normal, no rashes or significant lesions EYES: normal, Conjunctiva are pink and non-injected, sclera clear OROPHARYNX:no exudate, no erythema and lips, buccal mucosa, and tongue normal  Musculoskeletal:no cyanosis of digits and no clubbing  NEURO: alert & oriented x 3  with fluent speech, no focal motor/sensory deficits  LABORATORY DATA:  I have reviewed the data as listed    Component Value Date/Time   NA 137 10/23/2013 1244   NA 132* 09/30/2013 0522   K 3.9 10/23/2013 1244   K 4.9 09/30/2013 0522   CL 93* 09/30/2013 0522   CO2 26 10/23/2013 1244   CO2 26 09/30/2013 0522   GLUCOSE 83 10/23/2013 1244   GLUCOSE 124* 09/30/2013 0522   BUN 12.1 10/23/2013 1244   BUN 24* 09/30/2013 0522   CREATININE 1.2 10/23/2013 1244   CREATININE 1.27 09/30/2013 0522   CALCIUM 9.4 10/23/2013 1244   CALCIUM 9.4 09/30/2013 0522   PROT 6.7 10/23/2013 1244   PROT 7.5 09/30/2013 0522   ALBUMIN 3.4* 10/23/2013 1244   ALBUMIN 3.4* 09/30/2013 0522   AST 24 10/23/2013 1244   AST 24 09/30/2013 0522   ALT 29 10/23/2013 1244  ALT 43 09/30/2013 0522   ALKPHOS 85 10/23/2013 1244   ALKPHOS 101 09/30/2013 0522   BILITOT 0.41 10/23/2013 1244   BILITOT 0.3 09/30/2013 0522   GFRNONAA 66* 09/30/2013 0522   GFRAA 77* 09/30/2013 0522    No results found for this basename: SPEP, UPEP,  kappa and lambda light chains    Lab Results  Component Value Date   WBC 3.7* 11/26/2013   NEUTROABS 2.8 11/26/2013   HGB 10.8* 11/26/2013   HCT 33.8* 11/26/2013   MCV 90.2 11/26/2013   PLT 215 11/26/2013      Chemistry      Component Value Date/Time   NA 137 10/23/2013 1244   NA 132* 09/30/2013 0522   K 3.9 10/23/2013 1244   K 4.9 09/30/2013 0522   CL 93* 09/30/2013 0522   CO2 26 10/23/2013 1244   CO2 26 09/30/2013 0522   BUN 12.1 10/23/2013 1244   BUN 24* 09/30/2013 0522   CREATININE 1.2 10/23/2013 1244   CREATININE 1.27 09/30/2013 0522      Component Value Date/Time   CALCIUM 9.4 10/23/2013 1244   CALCIUM 9.4 09/30/2013 0522   ALKPHOS 85 10/23/2013 1244   ALKPHOS 101 09/30/2013 0522   AST 24 10/23/2013 1244   AST 24 09/30/2013 0522   ALT 29 10/23/2013 1244   ALT 43 09/30/2013 0522   BILITOT 0.41 10/23/2013 1244   BILITOT 0.3 09/30/2013 0522     ASSESSMENT & PLAN:  Tonsillar cancer Overall, he is feeling well. I will continue  aggressive supportive care. Plan for PET/CT scan at the end of October.     Protein-calorie malnutrition, severe He has lost 6 more pounds of weight since I saw him but he is now able to tolerate 6-8 cans of nutritional supplement. I continue to encourage him to continue the same and to increase oral fluid intake.    S/P gastrostomy He is managing his feeding tube well.  Nausea & vomiting This has resolved.  Leukopenia due to antineoplastic chemotherapy This is likely due to recent treatment. The patient denies recent history of fevers, cough, chills, diarrhea or dysuria. He is asymptomatic from the leukopenia. I will observe for now.      Chest wall pain This is improving. I will continue oxycodone taper.  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 The patient denies recent history of bleeding such as epistaxis, hematuria or hematochezia. He is asymptomatic from the anemia. We will observe for now.    I am also attempting to wean off some of his blood pressure medicine. Orders Placed This Encounter  Procedures  . NM PET Image Restag (PS) Skull Base To Thigh    Standing Status: Future     Number of Occurrences:      Standing Expiration Date: 01/26/2015    Order Specific Question:  Reason for Exam (SYMPTOM  OR DIAGNOSIS REQUIRED)    Answer:  staging tonsil ca    Order Specific Question:  Preferred imaging location?    Answer:  [facility]  . Comprehensive metabolic panel   All questions were answered. The patient knows to call the clinic with any problems, questions or concerns. No barriers to learning was detected. I spent 25 minutes counseling the patient face to face. The total time spent in the appointment was 30 minutes and more than 50% was on counseling and review of test results     [facility], [place], MD 11/26/2013 3:36 PM

## 2013-11-26 NOTE — Assessment & Plan Note (Signed)
He is managing his feeding tube well.

## 2013-11-26 NOTE — Assessment & Plan Note (Signed)
This is improving. I will continue oxycodone taper.

## 2013-11-26 NOTE — Assessment & Plan Note (Signed)
This is likely due to recent treatment. The patient denies recent history of fevers, cough, chills, diarrhea or dysuria. He is asymptomatic from the leukopenia. I will observe for now.  

## 2013-11-26 NOTE — Assessment & Plan Note (Signed)
This has resolved.

## 2013-11-26 NOTE — Assessment & Plan Note (Signed)
Overall, he is feeling well. I will continue aggressive supportive care. Plan for PET/CT scan at the end of October.

## 2013-11-26 NOTE — Telephone Encounter (Signed)
Pt confirmed labs/ov per 08/11 POF, gave pt AVS...KJ °

## 2013-11-26 NOTE — Patient Instructions (Signed)

## 2013-12-02 ENCOUNTER — Telehealth: Payer: Self-pay | Admitting: Hematology and Oncology

## 2013-12-02 NOTE — Telephone Encounter (Signed)
s.w. pt and advised on Sept appt time change due to MD out of the office half day....pt ok adn aware

## 2013-12-05 ENCOUNTER — Other Ambulatory Visit: Payer: Self-pay | Admitting: Hematology and Oncology

## 2013-12-13 ENCOUNTER — Ambulatory Visit: Admission: RE | Admit: 2013-12-13 | Payer: Medicaid Other | Source: Ambulatory Visit | Admitting: Radiation Oncology

## 2013-12-30 ENCOUNTER — Ambulatory Visit: Payer: Medicaid Other

## 2013-12-30 ENCOUNTER — Other Ambulatory Visit (HOSPITAL_BASED_OUTPATIENT_CLINIC_OR_DEPARTMENT_OTHER): Payer: Medicaid Other

## 2013-12-30 ENCOUNTER — Ambulatory Visit: Payer: Medicaid Other | Attending: Radiation Oncology

## 2013-12-30 DIAGNOSIS — R1310 Dysphagia, unspecified: Secondary | ICD-10-CM | POA: Insufficient documentation

## 2013-12-30 DIAGNOSIS — IMO0001 Reserved for inherently not codable concepts without codable children: Secondary | ICD-10-CM | POA: Diagnosis not present

## 2013-12-30 DIAGNOSIS — C099 Malignant neoplasm of tonsil, unspecified: Secondary | ICD-10-CM | POA: Insufficient documentation

## 2013-12-30 LAB — COMPREHENSIVE METABOLIC PANEL (CC13)
ALK PHOS: 49 U/L (ref 40–150)
ALT: 9 U/L (ref 0–55)
AST: 13 U/L (ref 5–34)
Albumin: 3.8 g/dL (ref 3.5–5.0)
Anion Gap: 8 mEq/L (ref 3–11)
BUN: 12.3 mg/dL (ref 7.0–26.0)
CALCIUM: 9.5 mg/dL (ref 8.4–10.4)
CHLORIDE: 106 meq/L (ref 98–109)
CO2: 28 mEq/L (ref 22–29)
CREATININE: 1.6 mg/dL — AB (ref 0.7–1.3)
Glucose: 132 mg/dl (ref 70–140)
Potassium: 3.8 mEq/L (ref 3.5–5.1)
Sodium: 142 mEq/L (ref 136–145)
Total Bilirubin: 0.44 mg/dL (ref 0.20–1.20)
Total Protein: 7 g/dL (ref 6.4–8.3)

## 2013-12-30 LAB — CBC WITH DIFFERENTIAL/PLATELET
BASO%: 0.3 % (ref 0.0–2.0)
Basophils Absolute: 0 10*3/uL (ref 0.0–0.1)
EOS%: 2.9 % (ref 0.0–7.0)
Eosinophils Absolute: 0.1 10*3/uL (ref 0.0–0.5)
HCT: 33.6 % — ABNORMAL LOW (ref 38.4–49.9)
HGB: 10.8 g/dL — ABNORMAL LOW (ref 13.0–17.1)
LYMPH%: 10.5 % — ABNORMAL LOW (ref 14.0–49.0)
MCH: 29.2 pg (ref 27.2–33.4)
MCHC: 32 g/dL (ref 32.0–36.0)
MCV: 91.2 fL (ref 79.3–98.0)
MONO#: 0.4 10*3/uL (ref 0.1–0.9)
MONO%: 9.3 % (ref 0.0–14.0)
NEUT%: 77 % — ABNORMAL HIGH (ref 39.0–75.0)
NEUTROS ABS: 3 10*3/uL (ref 1.5–6.5)
Platelets: 227 10*3/uL (ref 140–400)
RBC: 3.69 10*6/uL — AB (ref 4.20–5.82)
RDW: 15.1 % — ABNORMAL HIGH (ref 11.0–14.6)
WBC: 3.9 10*3/uL — ABNORMAL LOW (ref 4.0–10.3)
lymph#: 0.4 10*3/uL — ABNORMAL LOW (ref 0.9–3.3)

## 2013-12-31 ENCOUNTER — Other Ambulatory Visit: Payer: Self-pay | Admitting: Hematology and Oncology

## 2013-12-31 ENCOUNTER — Ambulatory Visit: Payer: Self-pay | Admitting: Hematology and Oncology

## 2013-12-31 ENCOUNTER — Telehealth: Payer: Self-pay | Admitting: Hematology and Oncology

## 2013-12-31 ENCOUNTER — Encounter: Payer: Self-pay | Admitting: Hematology and Oncology

## 2013-12-31 ENCOUNTER — Ambulatory Visit (HOSPITAL_BASED_OUTPATIENT_CLINIC_OR_DEPARTMENT_OTHER): Payer: Medicaid Other | Admitting: Hematology and Oncology

## 2013-12-31 VITALS — BP 131/72 | HR 90 | Temp 97.8°F | Resp 18 | Ht 79.0 in | Wt 255.0 lb

## 2013-12-31 DIAGNOSIS — D701 Agranulocytosis secondary to cancer chemotherapy: Secondary | ICD-10-CM

## 2013-12-31 DIAGNOSIS — T451X5A Adverse effect of antineoplastic and immunosuppressive drugs, initial encounter: Secondary | ICD-10-CM

## 2013-12-31 DIAGNOSIS — R071 Chest pain on breathing: Secondary | ICD-10-CM

## 2013-12-31 DIAGNOSIS — Z931 Gastrostomy status: Secondary | ICD-10-CM

## 2013-12-31 DIAGNOSIS — D72819 Decreased white blood cell count, unspecified: Secondary | ICD-10-CM

## 2013-12-31 DIAGNOSIS — D630 Anemia in neoplastic disease: Secondary | ICD-10-CM

## 2013-12-31 DIAGNOSIS — E43 Unspecified severe protein-calorie malnutrition: Secondary | ICD-10-CM

## 2013-12-31 DIAGNOSIS — C099 Malignant neoplasm of tonsil, unspecified: Secondary | ICD-10-CM

## 2013-12-31 DIAGNOSIS — R0789 Other chest pain: Secondary | ICD-10-CM

## 2013-12-31 DIAGNOSIS — N179 Acute kidney failure, unspecified: Secondary | ICD-10-CM

## 2013-12-31 DIAGNOSIS — Z23 Encounter for immunization: Secondary | ICD-10-CM

## 2013-12-31 MED ORDER — OXYCODONE HCL 10 MG PO TABS: 10.0000 mg | ORAL_TABLET | ORAL | Status: DC | PRN

## 2013-12-31 MED ORDER — INFLUENZA VAC SPLIT QUAD 0.5 ML IM SUSY
0.5000 mL | PREFILLED_SYRINGE | Freq: Once | INTRAMUSCULAR | Status: AC
  Administered 2013-12-31: 0.5 mL via INTRAMUSCULAR
  Filled 2013-12-31: qty 0.5

## 2013-12-31 MED ORDER — SODIUM CHLORIDE 0.9 % IJ SOLN
10.0000 mL | INTRAMUSCULAR | Status: DC | PRN
  Administered 2013-12-31: 10 mL via INTRAVENOUS
  Filled 2013-12-31: qty 10

## 2013-12-31 MED ORDER — HEPARIN SOD (PORK) LOCK FLUSH 100 UNIT/ML IV SOLN
500.0000 [IU] | Freq: Once | INTRAVENOUS | Status: AC
  Administered 2013-12-31: 500 [IU] via INTRAVENOUS
  Filled 2013-12-31: qty 5

## 2013-12-31 NOTE — Assessment & Plan Note (Signed)
This is likely due to recent treatment. The patient denies recent history of fevers, cough, chills, diarrhea or dysuria. He is asymptomatic from the leukopenia. I will observe for now.  

## 2013-12-31 NOTE — Assessment & Plan Note (Signed)
This is improving. I will continue oxycodone taper.

## 2013-12-31 NOTE — Progress Notes (Signed)
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 FNA of right level III lymph node was inconclusive for cancer   07/25/2013 Imaging PET scan showed locally advanced disease with abnormal lymphadenopathy in the right axilla   08/06/2013 Procedure CT-guided biopsy of the lymphadenopathy was negative for malignancy   08/15/2013 Surgery Patient has placement of port and feeding tube   08/19/2013 - 09/10/2013 Chemotherapy Patient received chemotherapy with cisplatin. The patient only received 2 doses due to uncontrolled nausea and acute renal failure.   08/19/2013 - 10/15/2013 Radiation Therapy Patient received radiation treatment   08/27/2013 - 08/30/2013 Hospital Admission The patient was admitted to the hospital for uncontrolled nausea vomiting and dehydration.    INTERVAL HISTORY: Please see below for problem oriented charting. He is doing well. He is gaining weight. He is to have persistent sore throat and some production of mucous. He denies further nausea or vomiting. He complains of chest wall discomfort at the site of the port.  REVIEW OF SYSTEMS:   Constitutional: Denies fevers, chills or abnormal weight loss Eyes: Denies blurriness of vision Cardiovascular: Denies palpitation, chest discomfort or  lower extremity swelling Gastrointestinal:  Denies nausea, heartburn or change in bowel habits Skin: Denies abnormal skin rashes Lymphatics: Denies new lymphadenopathy or easy bruising Neurological:Denies numbness, tingling or new weaknesses Behavioral/Psych: Mood is stable, no new changes  All other systems were reviewed with the patient and are negative.  I have reviewed the past medical history, past surgical history, social history and family history with the patient and they are unchanged from previous note.  ALLERGIES:  is allergic to pollen extract.  MEDICATIONS:  Current Outpatient Prescriptions  Medication Sig Dispense Refill  . lidocaine-prilocaine (EMLA) cream Apply 1 application topically as needed (For port-a-cath.).      Marland Kitchen Nutritional Supplements (FEEDING SUPPLEMENT, OSMOLITE 1.5 CAL,) LIQD Increase Osmolite 1.5 to goal of 2 cans QID via feeding tube with 60 ml free water before and after each bolus feeding.  Drink or put 300 ml free water in tube TID.  1920 mL  0  . sodium fluoride (FLUORISHIELD) 1.1 % GEL dental gel Place 1 drop onto teeth at bedtime. Instill one drop into each tooth space of fluoride tray. Place over teeth for 5 minutes. Remove. Spit out excess. Repeat nightly.      Marland Kitchen oxyCODONE 10 MG TABS Take 1 tablet (10 mg total) by mouth every 4 (four) hours as needed for severe pain.  30 tablet  0   Current Facility-Administered Medications  Medication Dose Route Frequency Provider Last Rate Last Dose  . sodium chloride 0.9 % injection 10 mL  10 mL Intravenous PRN [name], MD   10 mL at 12/31/13 1404    PHYSICAL EXAMINATION: ECOG PERFORMANCE STATUS: 1 - Symptomatic but completely ambulatory  Filed  Vitals:   12/31/13 1324  BP: 131/72  Pulse: 90  Temp: 97.8 F (36.6 C)  Resp: 18   Filed Weights   12/31/13 1324  Weight: 255 lb (115.667 kg)    GENERAL:alert, no distress and comfortable SKIN: skin color, texture, turgor are normal, no rashes or  significant lesions EYES: normal, Conjunctiva are pink and non-injected, sclera clear OROPHARYNX:no exudate, no erythema and lips, buccal mucosa, and tongue normal  NECK: supple, thyroid normal size, non-tender, without nodularity LYMPH:  no palpable lymphadenopathy in the cervical, axillary or inguinal LUNGS: clear to auscultation and percussion with normal breathing effort HEART: regular rate & rhythm and no murmurs and no lower extremity edema ABDOMEN:abdomen soft, non-tender and normal bowel sounds. The feeding tube site looks okay Musculoskeletal:no cyanosis of digits and no clubbing  NEURO: alert & oriented x 3 with fluent speech, no focal motor/sensory deficits  LABORATORY DATA:  I have reviewed the data as listed    Component Value Date/Time   NA 142 12/30/2013 1537   NA 132* 09/30/2013 0522   K 3.8 12/30/2013 1537   K 4.9 09/30/2013 0522   CL 93* 09/30/2013 0522   CO2 28 12/30/2013 1537   CO2 26 09/30/2013 0522   GLUCOSE 132 12/30/2013 1537   GLUCOSE 124* 09/30/2013 0522   BUN 12.3 12/30/2013 1537   BUN 24* 09/30/2013 0522   CREATININE 1.6* 12/30/2013 1537   CREATININE 1.27 09/30/2013 0522   CALCIUM 9.5 12/30/2013 1537   CALCIUM 9.4 09/30/2013 0522   PROT 7.0 12/30/2013 1537   PROT 7.5 09/30/2013 0522   ALBUMIN 3.8 12/30/2013 1537   ALBUMIN 3.4* 09/30/2013 0522   AST 13 12/30/2013 1537   AST 24 09/30/2013 0522   ALT 9 12/30/2013 1537   ALT 43 09/30/2013 0522   ALKPHOS 49 12/30/2013 1537   ALKPHOS 101 09/30/2013 0522   BILITOT 0.44 12/30/2013 1537   BILITOT 0.3 09/30/2013 0522   GFRNONAA 66* 09/30/2013 0522   GFRAA 77* 09/30/2013 0522    No results found for this basename: SPEP, UPEP,  kappa and lambda light chains    Lab Results  Component Value Date   WBC 3.9* 12/30/2013   NEUTROABS 3.0 12/30/2013   HGB 10.8* 12/30/2013   HCT 33.6* 12/30/2013   MCV 91.2 12/30/2013   PLT 227 12/30/2013      Chemistry      Component Value Date/Time   NA 142 12/30/2013 1537   NA 132* 09/30/2013 0522    K 3.8 12/30/2013 1537   K 4.9 09/30/2013 0522   CL 93* 09/30/2013 0522   CO2 28 12/30/2013 1537   CO2 26 09/30/2013 0522   BUN 12.3 12/30/2013 1537   BUN 24* 09/30/2013 0522   CREATININE 1.6* 12/30/2013 1537   CREATININE 1.27 09/30/2013 0522      Component Value Date/Time   CALCIUM 9.5 12/30/2013 1537   CALCIUM 9.4 09/30/2013 0522   ALKPHOS 49 12/30/2013 1537   ALKPHOS 101 09/30/2013 0522   AST 13 12/30/2013 1537   AST 24 09/30/2013 0522   ALT 9 12/30/2013 1537   ALT 43 09/30/2013 0522   BILITOT 0.44 12/30/2013 1537   BILITOT 0.3 09/30/2013 0522     Assessment & Plan Tonsillar cancer Overall, he is feeling well. I will continue aggressive supportive care. Plan for PET/CT scan at the end of October.  Protein-calorie malnutrition, severe He has stabilized his weight. However, he is due depending on tube feeding. He is motivated to increase oral  intake. He denies any dysphagia. We set a goal for him to maintain his current weight and to wean off his feeding tube within a month from now. I continue to encourage him to continue the same and to increase oral fluid intake.  Acute renal failure This was related to cisplatin treatment. With aggressive fluid resuscitation, this has improved. Continue to monitor closely.  Chest wall pain This is improving. I will continue oxycodone taper.  S/P gastrostomy He is managing his feeding tube well.  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 The patient denies recent history of bleeding such as epistaxis, hematuria or hematochezia. He is asymptomatic from the anemia. We will observe for now.    Leukopenia due to antineoplastic chemotherapy This is likely due to recent treatment. The patient denies recent history of fevers, cough, chills, diarrhea or dysuria. He is asymptomatic from the leukopenia. I will observe for now.     We discussed the importance of preventive care and reviewed the vaccination programs. He does  not have any prior allergic reactions to influenza vaccination. He agrees to proceed with influenza vaccination today and we will administer it today at the clinic.   Orders Placed This Encounter  Procedures  . Schedule Portacath Flush Appointment    Schedule Portacath flush appointment   All questions were answered. The patient knows to call the clinic with any problems, questions or concerns. No barriers to learning was detected. I spent 30 minutes counseling the patient face to face. The total time spent in the appointment was 40 minutes and more than 50% was on counseling and review of test results     [facility], [place], MD 12/31/2013 4:16 PM

## 2013-12-31 NOTE — Telephone Encounter (Signed)
gv and printed appt sched and avs for pt for OCT. °

## 2013-12-31 NOTE — Assessment & Plan Note (Signed)
He is managing his feeding tube well.

## 2013-12-31 NOTE — Assessment & Plan Note (Addendum)
He has stabilized his weight. However, he is due depending on tube feeding. He is motivated to increase oral intake. He denies any dysphagia. We set a goal for him to maintain his current weight and to wean off his feeding tube within a month from now. I continue to encourage him to continue the same and to increase oral fluid intake.

## 2013-12-31 NOTE — Assessment & Plan Note (Signed)
This was related to cisplatin treatment. With aggressive fluid resuscitation, this has improved. Continue to monitor closely.

## 2013-12-31 NOTE — Assessment & Plan Note (Addendum)
Overall, he is feeling well. I will continue aggressive supportive care. Plan for PET/CT scan at the end of October.

## 2013-12-31 NOTE — Assessment & Plan Note (Signed)
This is likely anemia of chronic disease. The patient denies recent history of bleeding such as epistaxis, hematuria or hematochezia. He is asymptomatic from the anemia. We will observe for now.  

## 2014-01-22 ENCOUNTER — Encounter: Payer: Self-pay | Admitting: Internal Medicine

## 2014-01-22 ENCOUNTER — Ambulatory Visit: Payer: Medicaid Other | Attending: Internal Medicine | Admitting: Internal Medicine

## 2014-01-22 VITALS — BP 118/81 | HR 78 | Temp 98.4°F | Resp 16 | Ht 79.0 in | Wt 251.0 lb

## 2014-01-22 DIAGNOSIS — M25561 Pain in right knee: Secondary | ICD-10-CM | POA: Diagnosis not present

## 2014-01-22 DIAGNOSIS — I10 Essential (primary) hypertension: Secondary | ICD-10-CM | POA: Diagnosis not present

## 2014-01-22 DIAGNOSIS — Z931 Gastrostomy status: Secondary | ICD-10-CM | POA: Insufficient documentation

## 2014-01-22 DIAGNOSIS — Z9221 Personal history of antineoplastic chemotherapy: Secondary | ICD-10-CM | POA: Diagnosis not present

## 2014-01-22 DIAGNOSIS — C099 Malignant neoplasm of tonsil, unspecified: Secondary | ICD-10-CM | POA: Insufficient documentation

## 2014-01-22 DIAGNOSIS — G629 Polyneuropathy, unspecified: Secondary | ICD-10-CM | POA: Diagnosis not present

## 2014-01-22 DIAGNOSIS — N179 Acute kidney failure, unspecified: Secondary | ICD-10-CM | POA: Diagnosis not present

## 2014-01-22 DIAGNOSIS — R0789 Other chest pain: Secondary | ICD-10-CM | POA: Diagnosis not present

## 2014-01-22 MED ORDER — OXYCODONE HCL 5 MG PO TABS: 5.0000 mg | ORAL_TABLET | Freq: Four times a day (QID) | ORAL | Status: DC | PRN

## 2014-01-22 MED ORDER — VITAMIN B-12 100 MCG PO TABS: 100.0000 ug | ORAL_TABLET | Freq: Every day | ORAL | Status: DC

## 2014-01-22 NOTE — Progress Notes (Signed)
Pt is here today c/o pain in his abdomen and chest. Pt is unable to lift his left arm due to the pain that the port in his chest. Pt states that his knee is also starting to give him pain and discomfort.

## 2014-01-22 NOTE — Progress Notes (Signed)
Patient ID: Mason [name], male   DOB: 12/29/1965, 48 y.o.   MRN: [id number]  CC: knee pain  HPI:  Patient presents today as a follow up.  He states that he has been seeing his oncologist for his tonsillar cancer.  He reports that they stopped chemotherapy due to severe nausea and vomiting with AKI.  He reports that he received over 20 rounds of radiation therapy and believes the cancer is in remission.  He currently still has his feeding tube in place until remission is confirmed.  He has began to have oral intake for the past three weeks and is now experiencing abdominal cramping.    He reports that his right knee pain has become increasingly worse over the past couple of months.  Knee pain described as throbbing and achy in the popliteal area.  He reports that his knee feels as if it will "give out" on him.  Since chemotherapy he notes numbness of the first two toes on bilateral lower extremities.    Port-a-cath has been very sore. Has been confirmed it is in right place. Cath gets accessed each month. Has had port since April of this year but does not remember it beginning so sore.  Allergies  Allergen Reactions  . Pollen Extract Other (See Comments)    Watery eyes, runny nose, sneezing   Past Medical History  Diagnosis Date  . 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 Knee pain, chronic   . Anxiety     mild new dx  . Arthritis     knees,hips  . Concussion     Hx: in high school x 2  . Complication of anesthesia     Pt stated " my oxygen level was slow in rising."  . Hypertension   . PEG (percutaneous endoscopic gastrostomy) status   . Malnutrition   . Bilateral edema of lower extremity   . Abnormal liver function test   . Anemia   . Constipation   . Hypokalemia   . Fever   . Hypoglycemia   . Renal failure, acute   . Severe nausea and vomiting   . Hyperactive gag reflex   . Status post chemotherapy     Only received 2 doses due to uncontrolled nausea and  acute renal failure  . S/P radiation therapy 08/19/2013-10/15/2013    Right Tonstil and bilateral neck / 70 Gy in 35 fractions to gross disease, 63 Gy in 35 fractions to high risk nodal echelons, and 56 Gy in 35 fractions to intermediate risk nodal echelons   Current Outpatient Prescriptions on File Prior to Visit  Medication Sig Dispense Refill  . lidocaine-prilocaine (EMLA) cream Apply 1 application topically as needed (For port-a-cath.).      Marland Kitchen Nutritional Supplements (FEEDING SUPPLEMENT, OSMOLITE 1.5 CAL,) LIQD Increase Osmolite 1.5 to goal of 2 cans QID via feeding tube with 60 ml free water before and after each bolus feeding.  Drink or put 300 ml free water in tube TID.  1920 mL  0  . oxyCODONE 10 MG TABS Take 1 tablet (10 mg total) by mouth every 4 (four) hours as needed for severe pain.  30 tablet  0  . sodium fluoride (FLUORISHIELD) 1.1 % GEL dental gel Place 1 drop onto teeth at bedtime. Instill one drop into each tooth space of fluoride tray. Place over teeth for 5 minutes. Remove. Spit out excess. Repeat nightly.       No current facility-administered medications  on file prior to visit.   Family History  Problem Relation Age of Onset  . CVA    . Diabetes    . Heart attack    . Arthritis Father   . Arthritis Mother    History   Social History  . Marital Status: Single    Spouse Name: N/A    Number of Children: 1  . Years of Education: N/A   Occupational History  . Not on file.   Social History Main Topics  . Smoking status: Former Smoker -- 1.00 packs/day for 20 years    Types: Cigarettes    Quit date: 07/16/2003  . Smokeless tobacco: Never Used  . Alcohol Use: 7.2 oz/week    12 Cans of beer per week     Comment: social [place], beer  "2 weekends out of the month"  . Drug Use: Yes    Special: Marijuana  . Sexual Activity: No   Other Topics Concern  . Not on file   Social History Narrative  . No narrative on file    Review of Systems: See  HPI   Objective:   Filed Vitals:   01/22/14 1200  BP: 118/81  Pulse: 78  Temp: 98.4 F (36.9 C)  Resp: 16    Physical Exam  Constitutional: He is oriented to person, place, and time.  Neck: Normal range of motion. Neck supple.  Cardiovascular: Normal rate, regular rhythm and normal heart sounds.   Pulmonary/Chest: Effort normal and breath sounds normal.  Abdominal: Soft. Bowel sounds are normal. There is tenderness.  Gastrostomy tube   Musculoskeletal: Normal range of motion. He exhibits tenderness (left pectoral muscles).  Neurological: He is alert and oriented to person, place, and time. He has normal reflexes.  Skin: Skin is warm and dry.     Lab Results  Component Value Date   WBC 3.9* 12/30/2013   HGB 10.8* 12/30/2013   HCT 33.6* 12/30/2013   MCV 91.2 12/30/2013   PLT 227 12/30/2013   Lab Results  Component Value Date   CREATININE 1.6* 12/30/2013   BUN 12.3 12/30/2013   NA 142 12/30/2013   K 3.8 12/30/2013   CL 93* 09/30/2013   CO2 28 12/30/2013    Lab Results  Component Value Date   HGBA1C 5.7 08/09/2013   Lipid Panel  No results found for this basename: chol, trig, hdl, cholhdl, vldl, ldlcalc       Assessment and plan:   Mason [name] was seen today for follow-up.  Diagnoses and associated orders for this visit:  Tonsillar cancer - oxyCODONE (ROXICODONE) 5 MG immediate release tablet; Take 1 tablet (5 mg total) by mouth every 6 (six) hours as needed for severe pain.  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  Knee pain, acute, right - DG Knee Complete 4 Views Right; Future  Chest wall pain From port a cath   S/P gastrostomy Continue to cap and keep area clean  Return in about 3 months (around 04/24/2014).        Chari Manning, NP-C [facility] [phone] 01/26/2014, 7:59 PM

## 2014-01-22 NOTE — Patient Instructions (Signed)

## 2014-01-27 ENCOUNTER — Ambulatory Visit: Payer: Self-pay

## 2014-01-27 ENCOUNTER — Ambulatory Visit: Payer: Medicaid Other | Attending: Radiation Oncology

## 2014-01-27 DIAGNOSIS — R1310 Dysphagia, unspecified: Secondary | ICD-10-CM | POA: Insufficient documentation

## 2014-01-27 DIAGNOSIS — C099 Malignant neoplasm of tonsil, unspecified: Secondary | ICD-10-CM | POA: Insufficient documentation

## 2014-01-30 ENCOUNTER — Telehealth: Payer: Self-pay | Admitting: Hematology and Oncology

## 2014-01-30 ENCOUNTER — Other Ambulatory Visit (HOSPITAL_BASED_OUTPATIENT_CLINIC_OR_DEPARTMENT_OTHER): Payer: Medicaid Other

## 2014-01-30 ENCOUNTER — Ambulatory Visit (HOSPITAL_BASED_OUTPATIENT_CLINIC_OR_DEPARTMENT_OTHER): Payer: Medicaid Other | Admitting: Hematology and Oncology

## 2014-01-30 ENCOUNTER — Encounter: Payer: Self-pay | Admitting: Hematology and Oncology

## 2014-01-30 VITALS — BP 143/74 | HR 87 | Temp 97.9°F | Resp 18 | Ht 79.0 in | Wt 248.6 lb

## 2014-01-30 DIAGNOSIS — C099 Malignant neoplasm of tonsil, unspecified: Secondary | ICD-10-CM

## 2014-01-30 DIAGNOSIS — D638 Anemia in other chronic diseases classified elsewhere: Secondary | ICD-10-CM

## 2014-01-30 DIAGNOSIS — R0789 Other chest pain: Secondary | ICD-10-CM

## 2014-01-30 DIAGNOSIS — D630 Anemia in neoplastic disease: Secondary | ICD-10-CM

## 2014-01-30 DIAGNOSIS — E43 Unspecified severe protein-calorie malnutrition: Secondary | ICD-10-CM

## 2014-01-30 DIAGNOSIS — N179 Acute kidney failure, unspecified: Secondary | ICD-10-CM

## 2014-01-30 LAB — COMPREHENSIVE METABOLIC PANEL (CC13)
ALK PHOS: 53 U/L (ref 40–150)
ALT: 10 U/L (ref 0–55)
AST: 15 U/L (ref 5–34)
Albumin: 3.9 g/dL (ref 3.5–5.0)
Anion Gap: 8 mEq/L (ref 3–11)
BILIRUBIN TOTAL: 0.6 mg/dL (ref 0.20–1.20)
BUN: 7.1 mg/dL (ref 7.0–26.0)
CO2: 25 mEq/L (ref 22–29)
Calcium: 9.5 mg/dL (ref 8.4–10.4)
Chloride: 107 mEq/L (ref 98–109)
Creatinine: 1.5 mg/dL — ABNORMAL HIGH (ref 0.7–1.3)
Glucose: 75 mg/dl (ref 70–140)
Potassium: 3.8 mEq/L (ref 3.5–5.1)
SODIUM: 141 meq/L (ref 136–145)
TOTAL PROTEIN: 6.7 g/dL (ref 6.4–8.3)

## 2014-01-30 LAB — CBC WITH DIFFERENTIAL/PLATELET
BASO%: 0.2 % (ref 0.0–2.0)
Basophils Absolute: 0 10*3/uL (ref 0.0–0.1)
EOS ABS: 0.1 10*3/uL (ref 0.0–0.5)
EOS%: 1.7 % (ref 0.0–7.0)
HEMATOCRIT: 33 % — AB (ref 38.4–49.9)
HGB: 10.9 g/dL — ABNORMAL LOW (ref 13.0–17.1)
LYMPH%: 12.4 % — AB (ref 14.0–49.0)
MCH: 28.9 pg (ref 27.2–33.4)
MCHC: 33 g/dL (ref 32.0–36.0)
MCV: 87.5 fL (ref 79.3–98.0)
MONO#: 0.4 10*3/uL (ref 0.1–0.9)
MONO%: 8.7 % (ref 0.0–14.0)
NEUT#: 3.5 10*3/uL (ref 1.5–6.5)
NEUT%: 77 % — AB (ref 39.0–75.0)
NRBC: 0 % (ref 0–0)
PLATELETS: 189 10*3/uL (ref 140–400)
RBC: 3.77 10*6/uL — ABNORMAL LOW (ref 4.20–5.82)
RDW: 13.7 % (ref 11.0–14.6)
WBC: 4.6 10*3/uL (ref 4.0–10.3)
lymph#: 0.6 10*3/uL — ABNORMAL LOW (ref 0.9–3.3)

## 2014-01-30 MED ORDER — OXYCODONE HCL 5 MG PO TABS: 5.0000 mg | ORAL_TABLET | Freq: Three times a day (TID) | ORAL | Status: DC | PRN

## 2014-01-30 NOTE — Telephone Encounter (Signed)
Pt sched for 10.27 @ 4:50pm with dr Rosendo Gros....gv and printed appt sched and avs for pt for OCT and NOV

## 2014-01-30 NOTE — Assessment & Plan Note (Signed)
Clinically, he has no signs of persistent disease. PET CT scan has been ordered for 2 weeks. I will see him back afterwards to review test results. I will get his case to be reviewed at the next ENT tumor board.

## 2014-01-30 NOTE — Assessment & Plan Note (Signed)
This is likely related to referred pain from his feeding tube. I refilled his prescription pain medicine and recommend removal of feeding tube. I recommend keeping his port until after CT scan result is available.

## 2014-01-30 NOTE — Progress Notes (Signed)
[place] OFFICE PROGRESS NOTE  Patient Care Team: Lance Bosch, NP as PCP - General (Internal Medicine) [name] Patient (General Practice) Brooks Sailors, RN as Registered Nurse (Oncology) [name], MD as Referring Physician (Otolaryngology) [name], MD as Consulting Physician (Hematology and Oncology)  SUMMARY OF ONCOLOGIC HISTORY: Oncology History   Tonsillar cancer, HPV positive   Primary site: Pharynx - Oropharynx (Right)   Staging method: AJCC 7th Edition   Clinical: Stage IVA (T2, N2b, M0) signed by [name], MD on 08/19/2013  1:24 PM   Summary: Stage IVA (T2, N2b, M0)       Tonsillar cancer   07/09/2013 Procedure 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 FNA of right level III lymph node was inconclusive for cancer   07/25/2013 Imaging PET scan showed locally advanced disease with abnormal lymphadenopathy in the right axilla   08/06/2013 Procedure CT-guided biopsy of the lymphadenopathy was negative for malignancy   08/15/2013 Surgery Patient has placement of port and feeding tube   08/19/2013 - 09/10/2013 Chemotherapy Patient received chemotherapy with cisplatin. The patient only received 2 doses due to uncontrolled nausea and acute renal failure.   08/19/2013 - 10/15/2013 Radiation Therapy Patient received radiation treatment   08/27/2013 - 08/30/2013 Hospital Admission The patient was admitted to the hospital for uncontrolled nausea vomiting and dehydration.    INTERVAL HISTORY: Please see below for problem oriented charting. He is doing very well and is able to maintain his weight oral intake only. He complained of persistent dry mouth. He has intermittent chest wall pain, likely referred pain from his feeding tube or port. He had occasional nausea.  REVIEW OF SYSTEMS:   Constitutional: Denies fevers, chills or abnormal weight loss Eyes: Denies blurriness of vision Ears, nose, mouth, throat, and face:  Denies mucositis or sore throat Respiratory: Denies cough, dyspnea or wheezes Cardiovascular: Denies palpitation, chest discomfort or lower extremity swelling Skin: Denies abnormal skin rashes Lymphatics: Denies new lymphadenopathy or easy bruising Neurological:Denies numbness, tingling or new weaknesses Behavioral/Psych: Mood is stable, no new changes  All other systems were reviewed with the patient and are negative.  I have reviewed the past medical history, past surgical history, social history and family history with the patient and they are unchanged from previous note.  ALLERGIES:  is allergic to pollen extract.  MEDICATIONS:  Current Outpatient Prescriptions  Medication Sig Dispense Refill  . lidocaine-prilocaine (EMLA) cream Apply 1 application topically as needed (For port-a-cath.).      Marland Kitchen oxyCODONE (ROXICODONE) 5 MG immediate release tablet Take 1 tablet (5 mg total) by mouth 3 (three) times daily as needed for severe pain.  60 tablet  0  . sodium fluoride (FLUORISHIELD) 1.1 % GEL dental gel Place 1 drop onto teeth at bedtime. Instill one drop into each tooth space of fluoride tray. Place over teeth for 5 minutes. Remove. Spit out excess. Repeat nightly.      . vitamin B-12 (CYANOCOBALAMIN) 100 MCG tablet Take 1 tablet (100 mcg total) by mouth daily.  30 tablet  4  . Nutritional Supplements (FEEDING SUPPLEMENT, OSMOLITE 1.5 CAL,) LIQD Increase Osmolite 1.5 to goal of 2 cans QID via feeding tube with 60 ml free water before and after each bolus feeding.  Drink or put 300 ml free water in tube TID.  1920 mL  0   No current facility-administered medications for this visit.    PHYSICAL EXAMINATION: ECOG PERFORMANCE STATUS: 1 - Symptomatic but  completely ambulatory  Filed Vitals:   01/30/14 1507  BP: 143/74  Pulse: 87  Temp: 97.9 F (36.6 C)  Resp: 18   Filed Weights   01/30/14 1507  Weight: 248 lb 9.6 oz (112.764 kg)    GENERAL:alert, no distress and  comfortable. SKIN: skin color, texture, turgor are normal, no rashes or significant lesions EYES: normal, Conjunctiva are pink and non-injected, sclera clear OROPHARYNX:no exudate, no erythema and lips, buccal mucosa, and tongue normal  NECK: supple, thyroid normal size, non-tender, without nodularity LYMPH:  no palpable lymphadenopathy in the cervical, axillary or inguinal LUNGS: clear to auscultation and percussion with normal breathing effort HEART: regular rate & rhythm and no murmurs and no lower extremity edema ABDOMEN:abdomen soft, non-tender and normal bowel sounds. Feeding tube site looks okay Musculoskeletal:no cyanosis of digits and no clubbing  NEURO: alert & oriented x 3 with fluent speech, no focal motor/sensory deficits  LABORATORY DATA:  I have reviewed the data as listed    Component Value Date/Time   NA 141 01/30/2014 1447   NA 132* 09/30/2013 0522   K 3.8 01/30/2014 1447   K 4.9 09/30/2013 0522   CL 93* 09/30/2013 0522   CO2 25 01/30/2014 1447   CO2 26 09/30/2013 0522   GLUCOSE 75 01/30/2014 1447   GLUCOSE 124* 09/30/2013 0522   BUN 7.1 01/30/2014 1447   BUN 24* 09/30/2013 0522   CREATININE 1.5* 01/30/2014 1447   CREATININE 1.27 09/30/2013 0522   CALCIUM 9.5 01/30/2014 1447   CALCIUM 9.4 09/30/2013 0522   PROT 6.7 01/30/2014 1447   PROT 7.5 09/30/2013 0522   ALBUMIN 3.9 01/30/2014 1447   ALBUMIN 3.4* 09/30/2013 0522   AST 15 01/30/2014 1447   AST 24 09/30/2013 0522   ALT 10 01/30/2014 1447   ALT 43 09/30/2013 0522   ALKPHOS 53 01/30/2014 1447   ALKPHOS 101 09/30/2013 0522   BILITOT 0.60 01/30/2014 1447   BILITOT 0.3 09/30/2013 0522   GFRNONAA 66* 09/30/2013 0522   GFRAA 77* 09/30/2013 0522    No results found for this basename: SPEP, UPEP,  kappa and lambda light chains    Lab Results  Component Value Date   WBC 4.6 01/30/2014   NEUTROABS 3.5 01/30/2014   HGB 10.9* 01/30/2014   HCT 33.0* 01/30/2014   MCV 87.5 01/30/2014   PLT 189 01/30/2014       Chemistry      Component Value Date/Time   NA 141 01/30/2014 1447   NA 132* 09/30/2013 0522   K 3.8 01/30/2014 1447   K 4.9 09/30/2013 0522   CL 93* 09/30/2013 0522   CO2 25 01/30/2014 1447   CO2 26 09/30/2013 0522   BUN 7.1 01/30/2014 1447   BUN 24* 09/30/2013 0522   CREATININE 1.5* 01/30/2014 1447   CREATININE 1.27 09/30/2013 0522      Component Value Date/Time   CALCIUM 9.5 01/30/2014 1447   CALCIUM 9.4 09/30/2013 0522   ALKPHOS 53 01/30/2014 1447   ALKPHOS 101 09/30/2013 0522   AST 15 01/30/2014 1447   AST 24 09/30/2013 0522   ALT 10 01/30/2014 1447   ALT 43 09/30/2013 0522   BILITOT 0.60 01/30/2014 1447   BILITOT 0.3 09/30/2013 0522     ASSESSMENT & PLAN:  Tonsillar cancer Clinically, he has no signs of persistent disease. PET CT scan has been ordered for 2 weeks. I will see him back afterwards to review test results. I will get his case to be reviewed at the  next ENT tumor board.  Protein-calorie malnutrition, severe This has resolved. He has not used his feeding tube for 3 weeks and denies dysphagia. I recommend removal of feeding tube.  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 The patient denies recent history of bleeding such as epistaxis, hematuria or hematochezia. He is asymptomatic from the anemia. We will observe for now.      Acute renal failure He has persistent elevated creatinine but this is stable.  Chest wall pain This is likely related to referred pain from his feeding tube. I refilled his prescription pain medicine and recommend removal of feeding tube. I recommend keeping his port until after CT scan result is available.   Orders Placed This Encounter  Procedures  . Ambulatory referral to General Surgery    Referral Priority:  Routine    Referral Type:  Surgical    Referral Reason:  Specialty Services Required    Referred to Provider:  [name], MD    Requested Specialty:  General Surgery    Number of Visits  Requested:  1   All questions were answered. The patient knows to call the clinic with any problems, questions or concerns. No barriers to learning was detected. I spent 25 minutes counseling the patient face to face. The total time spent in the appointment was 30 minutes and more than 50% was on counseling and review of test results     [facility], [place], MD 01/30/2014 8:08 PM

## 2014-01-30 NOTE — Assessment & Plan Note (Signed)
This is likely anemia of chronic disease. The patient denies recent history of bleeding such as epistaxis, hematuria or hematochezia. He is asymptomatic from the anemia. We will observe for now.  

## 2014-01-30 NOTE — Assessment & Plan Note (Signed)
He has persistent elevated creatinine but this is stable.

## 2014-01-30 NOTE — Assessment & Plan Note (Signed)
This has resolved. He has not used his feeding tube for 3 weeks and denies dysphagia. I recommend removal of feeding tube.

## 2014-02-14 ENCOUNTER — Ambulatory Visit (HOSPITAL_COMMUNITY)
Admission: RE | Admit: 2014-02-14 | Discharge: 2014-02-14 | Disposition: A | Discharge status: Home / Self Care | Payer: Medicaid Other | Source: Ambulatory Visit | Attending: Hematology and Oncology | Admitting: Hematology and Oncology

## 2014-02-14 DIAGNOSIS — C099 Malignant neoplasm of tonsil, unspecified: Secondary | ICD-10-CM | POA: Insufficient documentation

## 2014-02-14 DIAGNOSIS — I898 Other specified noninfective disorders of lymphatic vessels and lymph nodes: Secondary | ICD-10-CM | POA: Insufficient documentation

## 2014-02-14 DIAGNOSIS — R0789 Other chest pain: Secondary | ICD-10-CM

## 2014-02-14 LAB — GLUCOSE, CAPILLARY: Glucose-Capillary: 74 mg/dL (ref 70–99)

## 2014-02-14 MED ORDER — FLUDEOXYGLUCOSE F - 18 (FDG) INJECTION
12.1900 | Freq: Once | INTRAVENOUS | Status: AC | PRN
  Administered 2014-02-14: 12.19 via INTRAVENOUS

## 2014-02-19 ENCOUNTER — Ambulatory Visit (HOSPITAL_BASED_OUTPATIENT_CLINIC_OR_DEPARTMENT_OTHER): Payer: Medicaid Other | Admitting: Hematology and Oncology

## 2014-02-19 ENCOUNTER — Encounter: Payer: Self-pay | Admitting: Hematology and Oncology

## 2014-02-19 ENCOUNTER — Telehealth: Payer: Self-pay | Admitting: Hematology and Oncology

## 2014-02-19 VITALS — BP 135/86 | HR 67 | Temp 97.5°F | Resp 19 | Ht 79.0 in | Wt 247.2 lb

## 2014-02-19 DIAGNOSIS — C099 Malignant neoplasm of tonsil, unspecified: Secondary | ICD-10-CM

## 2014-02-19 DIAGNOSIS — R0789 Other chest pain: Secondary | ICD-10-CM

## 2014-02-19 DIAGNOSIS — R070 Pain in throat: Secondary | ICD-10-CM

## 2014-02-19 DIAGNOSIS — R221 Localized swelling, mass and lump, neck: Secondary | ICD-10-CM

## 2014-02-19 MED ORDER — OXYCODONE HCL 5 MG PO TABS: 5.0000 mg | ORAL_TABLET | Freq: Three times a day (TID) | ORAL | Status: DC | PRN

## 2014-02-19 NOTE — Telephone Encounter (Signed)
gv and printed appt sched and avs for opt for Nov adn Feb 2016.Marland KitchenMarland Kitchenpt sched to see Dr. Simeon Craft on 11.6 @ 2:40pm

## 2014-02-19 NOTE — Progress Notes (Signed)
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 FNA of right level III lymph node was inconclusive for cancer   07/25/2013 Imaging PET scan showed locally advanced disease with abnormal lymphadenopathy in the right axilla   08/06/2013 Procedure CT-guided biopsy of the lymphadenopathy was negative for malignancy   08/15/2013 Surgery Patient has placement of port and feeding tube   08/19/2013 - 09/10/2013 Chemotherapy Patient received chemotherapy with cisplatin. The patient only received 2 doses due to uncontrolled nausea and acute renal failure.   08/19/2013 - 10/15/2013 Radiation Therapy Patient received radiation treatment   08/27/2013 - 08/30/2013 Hospital Admission The patient was admitted to the hospital for uncontrolled nausea vomiting and dehydration.   02/14/2014 Imaging PET/CT scan showed complete response to treatment    INTERVAL HISTORY: Please see below for problem oriented charting. The patient returns for follow-up on test results. In the meantime he has mild throat pain. His feeding tube was removed. He denies further nausea.  REVIEW OF SYSTEMS:   Constitutional: Denies fevers, chills or abnormal weight loss Eyes: Denies blurriness of vision Respiratory: Denies cough, dyspnea or  wheezes Cardiovascular: Denies palpitation, chest discomfort or lower extremity swelling Gastrointestinal:  Denies nausea, heartburn or change in bowel habits Skin: Denies abnormal skin rashes Lymphatics: Denies new lymphadenopathy or easy bruising Neurological:Denies numbness, tingling or new weaknesses Behavioral/Psych: Mood is stable, no new changes  All other systems were reviewed with the patient and are negative.  I have reviewed the past medical history, past surgical history, social history and family history with the patient and they are unchanged from previous note.  ALLERGIES:  is allergic to pollen extract.  MEDICATIONS:  Current Outpatient Prescriptions  Medication Sig Dispense Refill  . lidocaine-prilocaine (EMLA) cream Apply 1 application topically as needed (For port-a-cath.).    Marland Kitchen Nutritional Supplements (FEEDING SUPPLEMENT, OSMOLITE 1.5 CAL,) LIQD Increase Osmolite 1.5 to goal of 2 cans QID via feeding tube with 60 ml free water before and after each bolus feeding.  Drink or put 300 ml free water in tube TID. 1920 mL 0  . oxyCODONE (ROXICODONE) 5 MG immediate release tablet Take 1 tablet (5 mg total) by mouth 3 (three) times daily as needed for severe pain. 60 tablet 0  . sodium fluoride (FLUORISHIELD) 1.1 % GEL dental gel Place 1 drop onto teeth at bedtime. Instill one drop into each tooth space of fluoride tray. Place over teeth for 5 minutes. Remove. Spit out excess. Repeat nightly.    . vitamin B-12 (CYANOCOBALAMIN) 100 MCG tablet Take 1 tablet (100 mcg total) by mouth daily. 30 tablet 4   No current facility-administered medications for this visit.    PHYSICAL EXAMINATION: ECOG PERFORMANCE STATUS: 1 - Symptomatic but completely ambulatory  Filed Vitals:   02/19/14 1152  BP: 135/86  Pulse: 67  Temp: 97.5 F (36.4 C)  Resp: 19   Filed Weights   02/19/14 1152  Weight: 247 lb 3.2 oz (112.129 kg)    GENERAL:alert, no distress and comfortable SKIN: skin  color, texture, turgor are normal, no rashes or significant lesions EYES: normal, Conjunctiva are pink and non-injected, sclera clear OROPHARYNX:no exudate, no erythema and lips, buccal mucosa, and tongue normal  NECK: supple, thyroid normal size, non-tender, without nodularity ABDOMEN:abdomen soft, non-tender and normal bowel sounds. The feeding tube site has healed Musculoskeletal:no cyanosis of digits and no clubbing  NEURO: alert & oriented x 3 with fluent speech, no focal motor/sensory deficits  LABORATORY DATA:  I have reviewed the data as listed    Component Value Date/Time   NA 141 01/30/2014 1447   NA 132* 09/30/2013 0522   K 3.8 01/30/2014 1447   K 4.9 09/30/2013 0522   CL 93* 09/30/2013 0522   CO2 25 01/30/2014 1447   CO2 26 09/30/2013 0522   GLUCOSE 75 01/30/2014 1447   GLUCOSE 124* 09/30/2013 0522   BUN 7.1 01/30/2014 1447   BUN 24* 09/30/2013 0522   CREATININE 1.5* 01/30/2014 1447   CREATININE 1.27 09/30/2013 0522   CALCIUM 9.5 01/30/2014 1447   CALCIUM 9.4 09/30/2013 0522   PROT 6.7 01/30/2014 1447   PROT 7.5 09/30/2013 0522   ALBUMIN 3.9 01/30/2014 1447   ALBUMIN 3.4* 09/30/2013 0522   AST 15 01/30/2014 1447   AST 24 09/30/2013 0522   ALT 10 01/30/2014 1447   ALT 43 09/30/2013 0522   ALKPHOS 53 01/30/2014 1447   ALKPHOS 101 09/30/2013 0522   BILITOT 0.60 01/30/2014 1447   BILITOT 0.3 09/30/2013 0522   GFRNONAA 66* 09/30/2013 0522   GFRAA 77* 09/30/2013 0522    No results found for: SPEP, UPEP  Lab Results  Component Value Date   WBC 4.6 01/30/2014   NEUTROABS 3.5 01/30/2014   HGB 10.9* 01/30/2014   HCT 33.0* 01/30/2014   MCV 87.5 01/30/2014   PLT 189 01/30/2014      Chemistry      Component Value Date/Time   NA 141 01/30/2014 1447   NA 132* 09/30/2013 0522   K 3.8 01/30/2014 1447   K 4.9 09/30/2013 0522   CL 93* 09/30/2013 0522   CO2 25 01/30/2014 1447   CO2 26 09/30/2013 0522   BUN 7.1 01/30/2014 1447   BUN 24* 09/30/2013 0522    CREATININE 1.5* 01/30/2014 1447   CREATININE 1.27 09/30/2013 0522      Component Value Date/Time   CALCIUM 9.5 01/30/2014 1447   CALCIUM 9.4 09/30/2013 0522   ALKPHOS 53 01/30/2014 1447   ALKPHOS 101 09/30/2013 0522   AST 15 01/30/2014 1447   AST 24 09/30/2013 0522   ALT 10 01/30/2014 1447   ALT 43 09/30/2013 0522   BILITOT 0.60 01/30/2014 1447   BILITOT 0.3 09/30/2013 0522       RADIOGRAPHIC STUDIES:I review the scan with the patient. I have personally reviewed the radiological images as listed and agreed with the findings in the report.  ASSESSMENT & PLAN:  Tonsillar cancer We reviewed his case at the tumor board this morning. He has complete response to treatment apart from persistent cystic lesion in the right side of the neck. After extensive discussion, we recommended referral back to ENT for fine-needle aspirate/biopsy. If results are benign, I read recommend repeat PET CT scan in 3 months and if it remain stable, we will discontinue imaging study. I recommend that we  keep the port for now and port flushes to keep the port patent. The patient is wondering about returning back to work. There is no contraindication for him to return to work but I do recommend that he start off with reduced hours until he can go back to full-time work.  Throat pain in adult He has persistent throat pain. I refill his prescription oxycodone one more time. I told him I will gradually wean him off his pain medicine over the next few months.   Orders Placed This Encounter  Procedures  . NM PET Image Restag (PS) Skull Base To Thigh    Standing Status: Future     Number of Occurrences:      Standing Expiration Date: 04/21/2015    Order Specific Question:  Reason for Exam (SYMPTOM  OR DIAGNOSIS REQUIRED)    Answer:  tonsil cancer    Order Specific Question:  Preferred imaging location?    Answer:  [facility]  . Ambulatory referral to ENT    Referral Priority:  Routine    Referral  Type:  Consultation    Referral Reason:  Specialty Services Required    Referred to Provider:  [name], MD    Requested Specialty:  Otolaryngology    Number of Visits Requested:  1   All questions were answered. The patient knows to call the clinic with any problems, questions or concerns. No barriers to learning was detected. I spent 30 minutes counseling the patient face to face. The total time spent in the appointment was 40 minutes and more than 50% was on counseling and review of test results     [facility], [place], MD 02/19/2014 3:21 PM

## 2014-02-19 NOTE — Assessment & Plan Note (Signed)
He has persistent throat pain. I refill his prescription oxycodone one more time. I told him I will gradually wean him off his pain medicine over the next few months.   

## 2014-02-19 NOTE — Assessment & Plan Note (Signed)
We reviewed his case at the tumor board this morning. He has complete response to treatment apart from persistent cystic lesion in the right side of the neck. After extensive discussion, we recommended referral back to ENT for fine-needle aspirate/biopsy. If results are benign, I read recommend repeat PET CT scan in 3 months and if it remain stable, we will discontinue imaging study. I recommend that we keep the port for now and port flushes to keep the port patent. The patient is wondering about returning back to work. There is no contraindication for him to return to work but I do recommend that he start off with reduced hours until he can go back to full-time work.

## 2014-02-24 ENCOUNTER — Ambulatory Visit: Payer: Self-pay

## 2014-02-24 ENCOUNTER — Ambulatory Visit: Payer: Medicaid Other | Attending: Radiation Oncology

## 2014-02-24 DIAGNOSIS — R1310 Dysphagia, unspecified: Secondary | ICD-10-CM | POA: Insufficient documentation

## 2014-02-24 DIAGNOSIS — C099 Malignant neoplasm of tonsil, unspecified: Secondary | ICD-10-CM | POA: Insufficient documentation

## 2014-02-26 ENCOUNTER — Other Ambulatory Visit: Payer: Self-pay | Admitting: Hematology and Oncology

## 2014-02-27 NOTE — Progress Notes (Signed)
Radiation Oncology         [phone] ________________________________  Name: Mason [name] MRN: [id number]  Date: 02/28/2014  DOB: 1965-10-11  Follow-Up Visit Note  CC: Chari Manning, NP  [name], MD  Diagnosis and Prior Radiotherapy:   [id number] Right Tonsil Squamous cell carcinoma, Stage IVA  Indication for treatment: Curative with chemotherapy  Radiation treatment dates: 08/19/2013-10/15/2013  Site/dose: Right Tonsil and bilateral neck / 70 Gy in 35 fractions to gross disease, 63 Gy in 35 fractions to high risk nodal echelons, and 56 Gy in 35 fractions to intermediate risk nodal echelons   Narrative:  The patient returns today for routine follow-up.  We reviewed his case / PET scan at the tumor board last week. He had complete response to treatment apart from persistent cystic lesion in the right side of the neck. After extensive discussion, we recommended referral back to ENT for fine-needle aspirate/biopsy.This was done today. If results are benign, Dr. Alvy Bimler will repeat PET CT scan in 3 months and if it remains stable, we will discontinue imaging.  He reports pain in his right neck with swallowing and at night that he rates at a 6/10. His is taking oxycodone 5 mg 2-3 times a day prn. He reports a dry mouth. He denies any mouth sores. He reports his taste buds are not back yet. He has lost 2 lbs since 02/19/14. His feeding tube has been removed and he says he can eat almost anything he wants. He reports his energy is slowly improving.   ALLERGIES:  is allergic to pollen extract.  Meds: Current Outpatient Prescriptions  Medication Sig Dispense Refill  . oxyCODONE (ROXICODONE) 5 MG immediate release tablet Take 1 tablet (5 mg total) by mouth 3 (three) times daily as needed for severe pain. 60 tablet 0  . lidocaine-prilocaine (EMLA) cream Apply 1 application topically as needed (For port-a-cath.).    Marland Kitchen Nutritional Supplements (FEEDING SUPPLEMENT, OSMOLITE 1.5 CAL,)  LIQD Increase Osmolite 1.5 to goal of 2 cans QID via feeding tube with 60 ml free water before and after each bolus feeding.  Drink or put 300 ml free water in tube TID. 1920 mL 0  . sodium fluoride (FLUORISHIELD) 1.1 % GEL dental gel Place 1 drop onto teeth at bedtime. Instill one drop into each tooth space of fluoride tray. Place over teeth for 5 minutes. Remove. Spit out excess. Repeat nightly.    . vitamin B-12 (CYANOCOBALAMIN) 100 MCG tablet Take 1 tablet (100 mcg total) by mouth daily. 30 tablet 4   No current facility-administered medications for this encounter.    Physical Findings: The patient is in no acute distress. Patient is alert and oriented.  height is 6\' 7"  (2.007 m) and weight is 245 lb 14.4 oz (111.54 kg). His oral temperature is 98 F (36.7 C). His blood pressure is 148/90 and his pulse is 58. His respiration is 20 and oxygen saturation is 100%. .  Oropharyngeal mucosa is intact with no thrush or lesions. Right neck subtle cervical fullness/swelling, otherwise no palpable cervical or supraclavicular lymphadenopathy. Skin intact and smooth over neck.     Lab Findings: Lab Results  Component Value Date   WBC 4.6 01/30/2014   HGB 10.9* 01/30/2014   HCT 33.0* 01/30/2014   MCV 87.5 01/30/2014   PLT 189 01/30/2014    Lab Results  Component Value Date   TSH 1.304 07/24/2013    Radiographic Findings: No results found.  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 - PEG tube:removed  3) Risk Factors: The patient has been educated about risk factors including alcohol and tobacco abuse; they understand that avoidance of alcohol and tobacco is important to prevent recurrences as well as other cancers  4) Swallowing: no issues  5) Dental: Encouraged to continue regular followup with dentistry, and dental hygiene including fluoride rinses.    6) Energy: will screen TSH q  6-39mo; recheck at next appt.  7) Social: Still feels too tired, weak, to work as Biomedical scientist for now. Disability application.  8) Other: f/u in med/ onc in 3 mo  9) Follow-up with me in 6 months. The patient was encouraged to call with any issues or questions before then.   _____________________________________   Eppie Gibson, MD

## 2014-02-28 ENCOUNTER — Ambulatory Visit
Admission: RE | Admit: 2014-02-28 | Discharge: 2014-02-28 | Disposition: A | Discharge status: Home / Self Care | Payer: Medicaid Other | Source: Ambulatory Visit | Attending: Radiation Oncology | Admitting: Radiation Oncology

## 2014-02-28 ENCOUNTER — Other Ambulatory Visit (HOSPITAL_COMMUNITY)
Admission: RE | Admit: 2014-02-28 | Discharge: 2014-02-28 | Disposition: A | Discharge status: Home / Self Care | Payer: Medicaid Other | Source: Ambulatory Visit | Attending: Otolaryngology | Admitting: Otolaryngology

## 2014-02-28 ENCOUNTER — Encounter: Payer: Self-pay | Admitting: Radiation Oncology

## 2014-02-28 ENCOUNTER — Telehealth: Payer: Self-pay | Admitting: Nutrition

## 2014-02-28 DIAGNOSIS — C099 Malignant neoplasm of tonsil, unspecified: Secondary | ICD-10-CM

## 2014-02-28 DIAGNOSIS — Z923 Personal history of irradiation: Secondary | ICD-10-CM | POA: Insufficient documentation

## 2014-02-28 DIAGNOSIS — C7989 Secondary malignant neoplasm of other specified sites: Secondary | ICD-10-CM | POA: Insufficient documentation

## 2014-02-28 NOTE — Telephone Encounter (Signed)
Patient reports he is eating well.  His feeding tube has been been removed.  He no longer requires nutrition support. Patient educated to contact me for any questions or concerns. Patient is appreciative of nutrition interventions.  **Disclaimer: This note was dictated with voice recognition software. Similar sounding words can inadvertently be transcribed and this note may contain transcription errors which may not have been corrected upon publication of note.**

## 2014-02-28 NOTE — Progress Notes (Signed)
Mason [name] here for follow up after treatment to his right tonsil and bilateral neck.  He reports pain in his right neck with swallowing and at night that he rates at a 6/10.  His is taking oxycodone 5 mg 2-3 times a day prn.  He reports a dry mouth.  He denies any mouth sores.  He reports his taste buds are not back yet.  He has lost 2 lbs since 02/19/14.  His feeding tube has been removed and he says he can eat almost anything he wants.  He reports his energy is slowly improving.  The skin on his neck is intact.  He reports hearing loss in his right ear that comes and goes.

## 2014-03-03 ENCOUNTER — Ambulatory Visit: Payer: Medicaid Other

## 2014-03-03 ENCOUNTER — Other Ambulatory Visit: Payer: Self-pay | Admitting: Otolaryngology

## 2014-03-03 ENCOUNTER — Telehealth: Payer: Self-pay | Admitting: Radiation Oncology

## 2014-03-03 NOTE — Telephone Encounter (Signed)
Confirmed with patient about getting him in for labwork for his follow-up on 08/22/14. He was agreeable.

## 2014-03-04 ENCOUNTER — Telehealth: Payer: Self-pay

## 2014-03-04 NOTE — Telephone Encounter (Signed)
Doc re: pt conversation now charted in a telephone encounter.

## 2014-03-06 ENCOUNTER — Telehealth: Payer: Self-pay | Admitting: *Deleted

## 2014-03-06 NOTE — Telephone Encounter (Signed)
Pt will call us back

## 2014-03-06 NOTE — Telephone Encounter (Signed)
-----   Message from [name], MD sent at 03/06/2014  2:16 PM EST ----- Regarding: biopsy Pls let him know biopsy is negative. Does he want me to get port out now or does he want to wait until next scan?

## 2014-03-12 ENCOUNTER — Telehealth: Payer: Self-pay | Admitting: *Deleted

## 2014-03-12 NOTE — Telephone Encounter (Signed)
Pt left VM would like to have his PAC removed.

## 2014-03-14 ENCOUNTER — Telehealth: Payer: Self-pay | Admitting: *Deleted

## 2014-03-14 NOTE — Telephone Encounter (Signed)
Pt asking about scheduling his PAC removal?  Unsure if Dr. Alvy Bimler has been able to send email to Dr. Rosendo Gros yet regarding this.  Informed pt I can call him back on Monday to let him know.   He also reports he is scheduled for Lymph Node removal by Dr. Simeon Craft on 12/07 and asks if it might be possible to have PAC removed at same time?  Instructed pt to ask Dr. Simeon Craft about this. It may need to be coordinated between Dr. Simeon Craft and Dr. Rosendo Gros.   Pt verbalized understanding. He also asking for refill on Oxycodone from Dr. Alvy Bimler for his throat pain.   Instructed pt she is not if office today,  He says it can wait until Monday.  Will contact pt on Monday regarding Pain medication.

## 2014-03-17 ENCOUNTER — Other Ambulatory Visit: Payer: Self-pay | Admitting: Hematology and Oncology

## 2014-03-17 ENCOUNTER — Telehealth: Payer: Self-pay | Admitting: Hematology and Oncology

## 2014-03-17 ENCOUNTER — Telehealth: Payer: Self-pay | Admitting: *Deleted

## 2014-03-17 DIAGNOSIS — R0789 Other chest pain: Secondary | ICD-10-CM

## 2014-03-17 DIAGNOSIS — C099 Malignant neoplasm of tonsil, unspecified: Secondary | ICD-10-CM

## 2014-03-17 MED ORDER — OXYCODONE HCL 5 MG PO TABS: 5.0000 mg | ORAL_TABLET | Freq: Three times a day (TID) | ORAL | Status: DC | PRN

## 2014-03-17 NOTE — Telephone Encounter (Signed)
Referral to general surgery was sent 1 month ago. I placed another POF today. It will be up to the surgeons to coordinate care among themselves

## 2014-03-17 NOTE — Telephone Encounter (Signed)
Informed pt of rx ready to pick up.  He verbalized understanding.  He says he has decided to wait to have PAC removed until after his lymph node surgery.

## 2014-03-17 NOTE — Telephone Encounter (Signed)
gv and printed appt sched and avs for pt for Dec 10 @ 9:40am

## 2014-03-18 ENCOUNTER — Telehealth: Payer: Self-pay | Admitting: Hematology and Oncology

## 2014-03-18 NOTE — Telephone Encounter (Signed)
Faxed pt medical records to [facility]

## 2014-03-19 ENCOUNTER — Other Ambulatory Visit: Payer: Self-pay | Admitting: Otolaryngology

## 2014-03-20 HISTORY — PX: LYMPH NODE BIOPSY: SHX201

## 2014-04-24 ENCOUNTER — Ambulatory Visit (INDEPENDENT_AMBULATORY_CARE_PROVIDER_SITE_OTHER): Payer: Self-pay | Admitting: General Surgery

## 2014-04-24 NOTE — H&P (Signed)
History of Present Illness [name] MD; 04/24/2014 9:15 AM) Patient words: port removal.  The patient is a 49 year old male presenting for a post-operative visit. The patient is a 49 year old male who is presenting been seen secondary to tonsillar cancer. Patient had a left Port-A-Cath as well as G-tube placed. Patient has had his G-tube removed. Patient has been sent by Dr. Alvy Bimler secondary to responsiveness to chemotherapy and excision for this Port-A-Cath. Patient otherwise states he's slowly getting back to normal by mouth intake.   Allergies Briant Cedar, CMA; 04/24/2014 9:04 AM) No Known Drug Allergies01/10/2014  Medication History Briant Cedar, CMA; 04/24/2014 9:05 AM) Oxycodone-Acetaminophen (5-325MG  Tablet, Oral) Active.  Vitals Briant Cedar CMA; 04/24/2014 9:05 AM) 04/24/2014 9:05 AM Weight: 221.38 lb Height: 79in Body Surface Area: 2.37 m Body Mass Index: 24.94 kg/m Temp.: 97.45F  Pulse: 83 (Regular)  BP: 124/80 (Sitting, Left Arm, Standard)    Physical Exam [name] MD; 04/24/2014 9:16 AM) General Mental Status-Alert. General Appearance-Consistent with stated age. Hydration-Well hydrated. Voice-Normal.  Integumentary Note: Left Port-A-Cath   Chest and Lung Exam Chest and lung exam reveals -quiet, even and easy respiratory effort with no use of accessory muscles and on auscultation, normal breath sounds, no adventitious sounds and normal vocal resonance. Inspection Chest Wall - Normal. Back - normal.  Cardiovascular Cardiovascular examination reveals -normal heart sounds, regular rate and rhythm with no murmurs and normal pedal pulses bilaterally.  Abdomen Inspection Inspection of the abdomen reveals - No Hernias. Skin - Scar - no surgical scars. Palpation/Percussion Palpation and Percussion of the abdomen reveal - Soft, Non Tender, No Rebound tenderness, No Rigidity (guarding) and No  hepatosplenomegaly. Auscultation Auscultation of the abdomen reveals - Bowel sounds normal.    Assessment & Plan [name] MD; 04/24/2014 9:17 AM) Charlann Lange IN PLACE (P92.92  [id number]) Impression: 49 year old male with tonsillar cancer status post chemotherapy via Port-A-Cath.  1. Patient now requests Port-A-Cath removal. 2. We will send the patient up for excision of Port-A-Cath. I discussed with the patient risks and benefits of the procedure to include but not limited to: Infection, bleeding, damages running structures, possible infection. Patient voiced understanding and wishes to proceed.

## 2014-05-12 ENCOUNTER — Encounter (HOSPITAL_COMMUNITY): Payer: Self-pay

## 2014-05-12 ENCOUNTER — Ambulatory Visit (HOSPITAL_COMMUNITY)
Admission: RE | Admit: 2014-05-12 | Discharge: 2014-05-12 | Disposition: A | Discharge status: Home / Self Care | Payer: Medicaid Other | Source: Ambulatory Visit | Attending: General Surgery | Admitting: General Surgery

## 2014-05-12 DIAGNOSIS — Z01812 Encounter for preprocedural laboratory examination: Secondary | ICD-10-CM | POA: Insufficient documentation

## 2014-05-12 DIAGNOSIS — C099 Malignant neoplasm of tonsil, unspecified: Secondary | ICD-10-CM | POA: Insufficient documentation

## 2014-05-12 LAB — BASIC METABOLIC PANEL
ANION GAP: 6 (ref 5–15)
BUN: 8 mg/dL (ref 6–23)
CO2: 30 mmol/L (ref 19–32)
CREATININE: 1.31 mg/dL (ref 0.50–1.35)
Calcium: 9.3 mg/dL (ref 8.4–10.5)
Chloride: 104 mmol/L (ref 96–112)
GFR calc Af Amer: 74 mL/min — ABNORMAL LOW (ref >90)
GFR calc non Af Amer: 64 mL/min — ABNORMAL LOW (ref >90)
Glucose, Bld: 84 mg/dL (ref 70–99)
POTASSIUM: 4.2 mmol/L (ref 3.5–5.1)
Sodium: 140 mmol/L (ref 135–145)

## 2014-05-12 LAB — CBC
HCT: 33.8 % — ABNORMAL LOW (ref 39.0–52.0)
Hemoglobin: 10.8 g/dL — ABNORMAL LOW (ref 13.0–17.0)
MCH: 27.6 pg (ref 26.0–34.0)
MCHC: 32 g/dL (ref 30.0–36.0)
MCV: 86.4 fL (ref 78.0–100.0)
Platelets: 206 10*3/uL (ref 150–400)
RBC: 3.91 MIL/uL — ABNORMAL LOW (ref 4.22–5.81)
RDW: 14.9 % (ref 11.5–15.5)
WBC: 3.6 10*3/uL — ABNORMAL LOW (ref 4.0–10.5)

## 2014-05-12 MED ORDER — CEFAZOLIN SODIUM-DEXTROSE 2-3 GM-% IV SOLR
2.0000 g | INTRAVENOUS | Status: AC
  Administered 2014-05-13: 2 g via INTRAVENOUS
  Filled 2014-05-12: qty 50

## 2014-05-12 MED ORDER — CHLORHEXIDINE GLUCONATE 4 % EX LIQD
1.0000 "application " | Freq: Once | CUTANEOUS | Status: DC
  Filled 2014-05-12: qty 15

## 2014-05-12 NOTE — Pre-Procedure Instructions (Signed)
;   RAYNARD MAPPS  05/12/2014   Your procedure is scheduled on:  Tuesday, January 26.  Report to [facility] Admitting at 9:00AM.  Call this number if you have problems the morning of surgery: [phone]   Remember:   Do not eat food or drink liquids after midnight.   Take these medicines the morning of surgery with A SIP OF WATER: None   Do not wear jewelry, make-up or nail polish.  Do not wear lotions, powders, or perfumes.              Men  may shave face and neck.  Do not bring valuables to the [facility] is not responsible  for any belongings or valuables.               Contacts, dentures or bridgework may not be worn into surgery.  Leave suitcase in the car. After surgery it may be brought to your room.  For patients admitted to the hospital, discharge time is determined by your  treatment team.               Patients discharged the day of surgery will not be allowed to drive home.  Name and phone number of your driver:-   Special Instructions: Review  [facility] - Preparing For Surgery.   Please read over the following fact sheets that you were given: Pain Booklet, Coughing and Deep Breathing and Surgical Site Infection Prevention

## 2014-05-12 NOTE — Progress Notes (Signed)
BMET was drawn due to Creatine of 1.5 01/2014, Creatine today was 1.31.

## 2014-05-13 ENCOUNTER — Ambulatory Visit (HOSPITAL_COMMUNITY)
Admission: RE | Admit: 2014-05-13 | Discharge: 2014-05-13 | Disposition: A | Discharge status: Home / Self Care | Payer: Medicaid Other | Source: Ambulatory Visit | Attending: General Surgery | Admitting: General Surgery

## 2014-05-13 ENCOUNTER — Ambulatory Visit (HOSPITAL_COMMUNITY): Payer: Medicaid Other | Admitting: Certified Registered Nurse Anesthetist

## 2014-05-13 ENCOUNTER — Encounter (HOSPITAL_COMMUNITY): Payer: Self-pay | Admitting: Certified Registered Nurse Anesthetist

## 2014-05-13 ENCOUNTER — Encounter (HOSPITAL_COMMUNITY)
Admission: RE | Disposition: A | Discharge status: Home / Self Care | Payer: Self-pay | Source: Ambulatory Visit | Attending: General Surgery

## 2014-05-13 DIAGNOSIS — C099 Malignant neoplasm of tonsil, unspecified: Secondary | ICD-10-CM | POA: Diagnosis present

## 2014-05-13 DIAGNOSIS — M1990 Unspecified osteoarthritis, unspecified site: Secondary | ICD-10-CM | POA: Diagnosis not present

## 2014-05-13 DIAGNOSIS — I10 Essential (primary) hypertension: Secondary | ICD-10-CM | POA: Insufficient documentation

## 2014-05-13 DIAGNOSIS — Z87891 Personal history of nicotine dependence: Secondary | ICD-10-CM | POA: Insufficient documentation

## 2014-05-13 HISTORY — PX: PORT-A-CATH REMOVAL: SHX5289

## 2014-05-13 SURGERY — REMOVAL PORT-A-CATH
Anesthesia: General | Site: Chest

## 2014-05-13 MED ORDER — PROMETHAZINE HCL 25 MG/ML IJ SOLN: 6.2500 mg | INTRAMUSCULAR | Status: DC | PRN

## 2014-05-13 MED ORDER — MIDAZOLAM HCL 2 MG/2ML IJ SOLN
INTRAMUSCULAR | Status: AC
  Filled 2014-05-13: qty 2

## 2014-05-13 MED ORDER — LACTATED RINGERS IV SOLN
INTRAVENOUS | Status: DC | PRN
  Administered 2014-05-13: 11:00:00 via INTRAVENOUS

## 2014-05-13 MED ORDER — LIDOCAINE HCL (CARDIAC) 20 MG/ML IV SOLN
INTRAVENOUS | Status: DC | PRN
  Administered 2014-05-13: 100 mg via INTRAVENOUS

## 2014-05-13 MED ORDER — PROPOFOL 10 MG/ML IV BOLUS
INTRAVENOUS | Status: DC | PRN
  Administered 2014-05-13: 180 mg via INTRAVENOUS

## 2014-05-13 MED ORDER — LACTATED RINGERS IV SOLN
INTRAVENOUS | Status: DC
Start: 2014-05-13 — End: 2014-05-13
  Administered 2014-05-13: 10:00:00 via INTRAVENOUS

## 2014-05-13 MED ORDER — OXYCODONE HCL 5 MG/5ML PO SOLN: 5.0000 mg | Freq: Once | ORAL | Status: DC | PRN

## 2014-05-13 MED ORDER — BUPIVACAINE HCL 0.25 % IJ SOLN
INTRAMUSCULAR | Status: DC | PRN
  Administered 2014-05-13: 6 mL

## 2014-05-13 MED ORDER — OXYCODONE-ACETAMINOPHEN 5-325 MG PO TABS: 1.0000 | ORAL_TABLET | ORAL | Status: DC | PRN

## 2014-05-13 MED ORDER — PROPOFOL 10 MG/ML IV BOLUS
INTRAVENOUS | Status: AC
  Filled 2014-05-13: qty 20

## 2014-05-13 MED ORDER — OXYCODONE HCL 5 MG PO TABS: 5.0000 mg | ORAL_TABLET | Freq: Once | ORAL | Status: DC | PRN

## 2014-05-13 MED ORDER — BUPIVACAINE HCL (PF) 0.25 % IJ SOLN
INTRAMUSCULAR | Status: AC
  Filled 2014-05-13: qty 30

## 2014-05-13 MED ORDER — HYDROMORPHONE HCL 1 MG/ML IJ SOLN
INTRAMUSCULAR | Status: AC
  Filled 2014-05-13: qty 1

## 2014-05-13 MED ORDER — 0.9 % SODIUM CHLORIDE (POUR BTL) OPTIME
TOPICAL | Status: DC | PRN
  Administered 2014-05-13: 1000 mL

## 2014-05-13 MED ORDER — FENTANYL CITRATE 0.05 MG/ML IJ SOLN
INTRAMUSCULAR | Status: DC | PRN
  Administered 2014-05-13: 100 ug via INTRAVENOUS
  Administered 2014-05-13: 25 ug via INTRAVENOUS

## 2014-05-13 MED ORDER — ONDANSETRON HCL 4 MG/2ML IJ SOLN
INTRAMUSCULAR | Status: DC | PRN
  Administered 2014-05-13: 4 mg via INTRAVENOUS

## 2014-05-13 MED ORDER — HYDROMORPHONE HCL 1 MG/ML IJ SOLN
0.2500 mg | INTRAMUSCULAR | Status: DC | PRN
  Administered 2014-05-13 (×2): 0.5 mg via INTRAVENOUS

## 2014-05-13 MED ORDER — MIDAZOLAM HCL 5 MG/5ML IJ SOLN
INTRAMUSCULAR | Status: DC | PRN
  Administered 2014-05-13: 2 mg via INTRAVENOUS

## 2014-05-13 MED ORDER — FENTANYL CITRATE 0.05 MG/ML IJ SOLN
INTRAMUSCULAR | Status: AC
  Filled 2014-05-13: qty 5

## 2014-05-13 MED ORDER — LACTATED RINGERS IV SOLN: INTRAVENOUS | Status: DC

## 2014-05-13 MED ORDER — LIDOCAINE HCL (CARDIAC) 20 MG/ML IV SOLN
INTRAVENOUS | Status: AC
  Filled 2014-05-13: qty 5

## 2014-05-13 SURGICAL SUPPLY — 38 items
BLADE SURG 15 STRL LF DISP TIS (BLADE) ×1 IMPLANT
BLADE SURG 15 STRL SS (BLADE) ×2
CHLORAPREP W/TINT 10.5 ML (MISCELLANEOUS) ×3 IMPLANT
COVER SURGICAL LIGHT HANDLE (MISCELLANEOUS) ×3 IMPLANT
DRAPE PED LAPAROTOMY (DRAPES) ×3 IMPLANT
DRAPE UTILITY XL STRL (DRAPES) ×6 IMPLANT
ELECT CAUTERY BLADE 6.4 (BLADE) ×3 IMPLANT
ELECT REM PT RETURN 9FT ADLT (ELECTROSURGICAL) ×3
ELECTRODE REM PT RTRN 9FT ADLT (ELECTROSURGICAL) ×1 IMPLANT
GAUZE SPONGE 4X4 16PLY XRAY LF (GAUZE/BANDAGES/DRESSINGS) ×3 IMPLANT
GLOVE BIO SURGEON STRL SZ7.5 (GLOVE) ×3 IMPLANT
GLOVE BIOGEL PI IND STRL 7.0 (GLOVE) ×1 IMPLANT
GLOVE BIOGEL PI IND STRL 7.5 (GLOVE) ×1 IMPLANT
GLOVE BIOGEL PI IND STRL 8 (GLOVE) ×1 IMPLANT
GLOVE BIOGEL PI INDICATOR 7.0 (GLOVE) ×2
GLOVE BIOGEL PI INDICATOR 7.5 (GLOVE) ×2
GLOVE BIOGEL PI INDICATOR 8 (GLOVE) ×2
GLOVE SKINSENSE NS SZ7.5 (GLOVE) ×2
GLOVE SKINSENSE STRL SZ7.5 (GLOVE) ×1 IMPLANT
GOWN STRL REUS W/ TWL LRG LVL3 (GOWN DISPOSABLE) ×2 IMPLANT
GOWN STRL REUS W/ TWL XL LVL3 (GOWN DISPOSABLE) ×2 IMPLANT
GOWN STRL REUS W/TWL LRG LVL3 (GOWN DISPOSABLE) ×4
GOWN STRL REUS W/TWL XL LVL3 (GOWN DISPOSABLE) ×4
KIT BASIN OR (CUSTOM PROCEDURE TRAY) ×3 IMPLANT
KIT ROOM TURNOVER OR (KITS) ×3 IMPLANT
LIQUID BAND (GAUZE/BANDAGES/DRESSINGS) ×3 IMPLANT
NEEDLE HYPO 25GX1X1/2 BEV (NEEDLE) ×3 IMPLANT
NS IRRIG 1000ML POUR BTL (IV SOLUTION) ×3 IMPLANT
PACK SURGICAL SETUP 50X90 (CUSTOM PROCEDURE TRAY) ×3 IMPLANT
PAD ARMBOARD 7.5X6 YLW CONV (MISCELLANEOUS) ×6 IMPLANT
PENCIL BUTTON HOLSTER BLD 10FT (ELECTRODE) ×3 IMPLANT
SUT MNCRL AB 3-0 PS2 18 (SUTURE) ×3 IMPLANT
SUT SILK 2 0 SH (SUTURE) ×3 IMPLANT
SUT VIC AB 3-0 SH 27 (SUTURE) ×2
SUT VIC AB 3-0 SH 27X BRD (SUTURE) ×1 IMPLANT
SYR CONTROL 10ML LL (SYRINGE) ×3 IMPLANT
TOWEL OR 17X24 6PK STRL BLUE (TOWEL DISPOSABLE) ×3 IMPLANT
TOWEL OR 17X26 10 PK STRL BLUE (TOWEL DISPOSABLE) ×3 IMPLANT

## 2014-05-13 NOTE — Interval H&P Note (Signed)
History and Physical Interval Note:  05/13/2014 10:29 AM  Mason [name]  has presented today for surgery, with the diagnosis of TONSILLAR CANCER  The various methods of treatment have been discussed with the patient and family. After consideration of risks, benefits and other options for treatment, the patient has consented to  Procedure(s): REMOVAL PORT-A-CATH (N/A) as a surgical intervention .  The patient's history has been reviewed, patient examined, no change in status, stable for surgery.  I have reviewed the patient's chart and labs.  Questions were answered to the patient's satisfaction.     Rosario Jacks., Anne Hahn

## 2014-05-13 NOTE — Anesthesia Procedure Notes (Signed)
Procedure Name: LMA Insertion Date/Time: 05/13/2014 11:34 AM Performed by: Ollen Bowl Pre-anesthesia Checklist: Patient identified, Emergency Drugs available, Suction available, Patient being monitored and Timeout performed Patient Re-evaluated:Patient Re-evaluated prior to inductionOxygen Delivery Method: Circle system utilized and Simple face mask Preoxygenation: Pre-oxygenation with 100% oxygen Intubation Type: IV induction Ventilation: Mask ventilation without difficulty LMA: LMA inserted LMA Size: 5.0 Number of attempts: 1 Airway Equipment and Method: Patient positioned with wedge pillow Placement Confirmation: positive ETCO2 and breath sounds checked- equal and bilateral Tube secured with: Tape Dental Injury: Teeth and Oropharynx as per pre-operative assessment

## 2014-05-13 NOTE — Op Note (Signed)
05/13/2014  11:58 AM  PATIENT:  Mason [name]  49 y.o. male  PRE-OPERATIVE DIAGNOSIS:  TONSILLAR CANCER  POST-OPERATIVE DIAGNOSIS:  s/p tonsillar cancer  PROCEDURE:  Procedure(s): REMOVAL of PORT-A-CATH (N/A)  SURGEON:  Surgeon(s) and Role:    * [name], MD - Primary   ASSISTANTS: none   ANESTHESIA:   local and general  EBL:     BLOOD ADMINISTERED:none  DRAINS: none   LOCAL MEDICATIONS USED:  BUPIVICAINE   SPECIMEN:  No Specimen  DISPOSITION OF SPECIMEN:  N/A  COUNTS:  YES  TOURNIQUET:  * No tourniquets in log *  DICTATION: .Dragon Dictation After the patient was consented he was taken back to the OR.  He was placed in the supine position with bilateral SCds in place. The patient was prepped and draped in the usual sterile fashion.  A small 3cm incision was made just over the previous incision.  Electorcautery was used to maintain hemostasis.  The catheter was removed in its entirety.  A 2-0 silk was used to ligate the catheter tunnel.  The port was removed and all 3 prolenes were removed.  The pocket was cauterized to help with adhesions.  A 2-0 vicryl was used to reapproximate the pocket.  2-0 vicryls were used to reapproximate the deep dermal area.  A 4-0 monocryl was used to reapproximate the skin.  Dermabond was used to dress the skin.  The patient tolerated the procedure well and was taken to the PACU in stable condition.  PLAN OF CARE: Discharge to home after PACU  PATIENT DISPOSITION:  PACU - hemodynamically stable.   Delay start of Pharmacological VTE agent (>24hrs) due to surgical blood loss or risk of bleeding: not applicable

## 2014-05-13 NOTE — Discharge Instructions (Signed)
What to eat: ° °For your first meals, you should eat lightly; only small meals initially.  If you do not have nausea, you may eat larger meals.  Avoid spicy, greasy and heavy food.   ° °General Anesthesia, Adult, Care After  °Refer to this sheet in the next few weeks. These instructions provide you with information on caring for yourself after your procedure. Your health care provider may also give you more specific instructions. Your treatment has been planned according to current medical practices, but problems sometimes occur. Call your health care provider if you have any problems or questions after your procedure.  °WHAT TO EXPECT AFTER THE PROCEDURE  °After the procedure, it is typical to experience:  °Sleepiness.  °Nausea and vomiting. °HOME CARE INSTRUCTIONS  °For the first 24 hours after general anesthesia:  °Have a responsible person with you.  °Do not drive a car. If you are alone, do not take public transportation.  °Do not drink alcohol.  °Do not take medicine that has not been prescribed by your health care provider.  °Do not sign important papers or make important decisions.  °You may resume a normal diet and activities as directed by your health care provider.  °Change bandages (dressings) as directed.  °If you have questions or problems that seem related to general anesthesia, call the hospital and ask for the anesthetist or anesthesiologist on call. °SEEK MEDICAL CARE IF:  °You have nausea and vomiting that continue the day after anesthesia.  °You develop a rash. °SEEK IMMEDIATE MEDICAL CARE IF:  °You have difficulty breathing.  °You have chest pain.  °You have any allergic problems. °Document Released: 07/11/2000 Document Revised: 12/05/2012 Document Reviewed: 10/18/2012  °ExitCare® Patient Information ©2014 [employer].  ° ° °Laceration Care, Adult  ° ° °A laceration is a cut that goes through all layers of the skin. The cut goes into the tissue beneath the skin.  °HOME CARE  °For stitches  (sutures) or staples:  °Keep the cut clean and dry.  °If you have a bandage (dressing), change it at least once a day. Change the bandage if it gets wet or dirty, or as told by your doctor.  °Wash the cut with soap and water 2 times a day. Rinse the cut with water. Pat it dry with a clean towel.  °Put a thin layer of medicated cream on the cut as told by your doctor.  °You may shower after the first 24 hours. Do not soak the cut in water until the stitches are removed.  °Only take medicines as told by your doctor.  °Have your stitches or staples removed as told by your doctor. °For skin adhesive strips:  °Keep the cut clean and dry.  °Do not get the strips wet. You may take a bath, but be careful to keep the cut dry.  °If the cut gets wet, pat it dry with a clean towel.  °The strips will fall off on their own. Do not remove the strips that are still stuck to the cut. °For wound glue:  °You may shower or take baths. Do not soak or scrub the cut. Do not swim. Avoid heavy sweating until the glue falls off on its own. After a shower or bath, pat the cut dry with a clean towel.  °Do not put medicine on your cut until the glue falls off.  °If you have a bandage, do not put tape over the glue.  °Avoid lots of sunlight or tanning lamps until   the glue falls off. Put sunscreen on the cut for the first year to reduce your scar.  °The glue will fall off on its own. Do not pick at the glue. °You may need a tetanus shot if:  °You cannot remember when you had your last tetanus shot.  °You have never had a tetanus shot. °If you need a tetanus shot and you choose not to have one, you may get tetanus. Sickness from tetanus can be serious.  °GET HELP RIGHT AWAY IF:  °Your pain does not get better with medicine.  °Your arm, hand, leg, or foot loses feeling (numbness) or changes color.  °Your cut is bleeding.  °Your joint feels weak, or you cannot use your joint.  °You have painful lumps on your body.  °Your cut is red, puffy (swollen),  or painful.  °You have a red line on the skin near the cut.  °You have yellowish-white fluid (pus) coming from the cut.  °You have a fever.  °You have a bad smell coming from the cut or bandage.  °Your cut breaks open before or after stitches are removed.  °You notice something coming out of the cut, such as wood or glass.  °You cannot move a finger or toe. °MAKE SURE YOU:  °Understand these instructions.  °Will watch your condition.  °Will get help right away if you are not doing well or get worse. °Document Released: 09/21/2007 Document Revised: 06/27/2011 Document Reviewed: 09/28/2010  °ExitCare® Patient Information ©2014 [employer].  ° ° °

## 2014-05-13 NOTE — H&P (View-Only) (Signed)
History of Present Illness [name] MD; 04/24/2014 9:15 AM) Patient words: port removal.  The patient is a 49 year old male presenting for a post-operative visit. The patient is a 49 year old male who is presenting been seen secondary to tonsillar cancer. Patient had a left Port-A-Cath as well as G-tube placed. Patient has had his G-tube removed. Patient has been sent by Dr. Alvy Bimler secondary to responsiveness to chemotherapy and excision for this Port-A-Cath. Patient otherwise states he's slowly getting back to normal by mouth intake.   Allergies Briant Cedar, CMA; 04/24/2014 9:04 AM) No Known Drug Allergies01/10/2014  Medication History Briant Cedar, CMA; 04/24/2014 9:05 AM) Oxycodone-Acetaminophen (5-325MG  Tablet, Oral) Active.  Vitals Briant Cedar CMA; 04/24/2014 9:05 AM) 04/24/2014 9:05 AM Weight: 221.38 lb Height: 79in Body Surface Area: 2.37 m Body Mass Index: 24.94 kg/m Temp.: 97.62F  Pulse: 83 (Regular)  BP: 124/80 (Sitting, Left Arm, Standard)    Physical Exam [name] MD; 04/24/2014 9:16 AM) General Mental Status-Alert. General Appearance-Consistent with stated age. Hydration-Well hydrated. Voice-Normal.  Integumentary Note: Left Port-A-Cath   Chest and Lung Exam Chest and lung exam reveals -quiet, even and easy respiratory effort with no use of accessory muscles and on auscultation, normal breath sounds, no adventitious sounds and normal vocal resonance. Inspection Chest Wall - Normal. Back - normal.  Cardiovascular Cardiovascular examination reveals -normal heart sounds, regular rate and rhythm with no murmurs and normal pedal pulses bilaterally.  Abdomen Inspection Inspection of the abdomen reveals - No Hernias. Skin - Scar - no surgical scars. Palpation/Percussion Palpation and Percussion of the abdomen reveal - Soft, Non Tender, No Rebound tenderness, No Rigidity (guarding) and No  hepatosplenomegaly. Auscultation Auscultation of the abdomen reveals - Bowel sounds normal.    Assessment & Plan [name] MD; 04/24/2014 9:17 AM) Charlann Lange IN PLACE (I09.73  [id number]) Impression: 49 year old male with tonsillar cancer status post chemotherapy via Port-A-Cath.  1. Patient now requests Port-A-Cath removal. 2. We will send the patient up for excision of Port-A-Cath. I discussed with the patient risks and benefits of the procedure to include but not limited to: Infection, bleeding, damages running structures, possible infection. Patient voiced understanding and wishes to proceed.

## 2014-05-13 NOTE — Transfer of Care (Signed)
Immediate Anesthesia Transfer of Care Note  Patient: Mason [name]  Procedure(s) Performed: Procedure(s): REMOVAL of PORT-A-CATH (N/A)  Patient Location: PACU  Anesthesia Type:General  Level of Consciousness: awake, alert  and oriented  Airway & Oxygen Therapy: Patient Spontanous Breathing and Patient connected to nasal cannula oxygen  Post-op Assessment: Report given to PACU RN, Post -op Vital signs reviewed and stable and Patient moving all extremities X 4  Post vital signs: Reviewed and stable  Complications: No apparent anesthesia complications

## 2014-05-13 NOTE — Progress Notes (Signed)
Pt has been sipping water all morning due to dry mouth. Dr Tobias Alexander notified

## 2014-05-13 NOTE — Anesthesia Preprocedure Evaluation (Signed)
Anesthesia Evaluation  Patient identified by MRN, date of birth, ID band  Reviewed: Allergy & Precautions, NPO status , Patient's Chart, lab work & pertinent test results  History of Anesthesia Complications Negative for: history of anesthetic complications  Airway Mallampati: III  TM Distance: >3 FB Neck ROM: Full    Dental  (+) Dental Advisory Given, Missing   Pulmonary former smoker,          Cardiovascular hypertension,     Neuro/Psych PSYCHIATRIC DISORDERS Anxiety negative neurological ROS     GI/Hepatic Neg liver ROS,   Endo/Other    Renal/GU      Musculoskeletal  (+) Arthritis -,   Abdominal   Peds negative pediatric ROS (+)  Hematology   Anesthesia Other Findings   Reproductive/Obstetrics                             Anesthesia Physical Anesthesia Plan  ASA: III  Anesthesia Plan: General   Post-op Pain Management:    Induction: Intravenous  Airway Management Planned: LMA  Additional Equipment:   Intra-op Plan:   Post-operative Plan: Extubation in OR  Informed Consent: I have reviewed the patients History and Physical, chart, labs and discussed the procedure including the risks, benefits and alternatives for the proposed anesthesia with the patient or authorized representative who has indicated his/her understanding and acceptance.   Dental advisory given  Plan Discussed with: CRNA, Anesthesiologist and Surgeon  Anesthesia Plan Comments:         Anesthesia Quick Evaluation

## 2014-05-14 ENCOUNTER — Ambulatory Visit: Payer: Self-pay | Admitting: Internal Medicine

## 2014-05-15 ENCOUNTER — Encounter (HOSPITAL_COMMUNITY): Payer: Self-pay | Admitting: General Surgery

## 2014-05-15 NOTE — Anesthesia Postprocedure Evaluation (Signed)
Anesthesia Post Note  Patient: Mason [name]  Procedure(s) Performed: Procedure(s) (LRB): REMOVAL of PORT-A-CATH (N/A)  Anesthesia type: general  Patient location: PACU  Post pain: Pain level controlled  Post assessment: Patient's Cardiovascular Status Stable  Last Vitals:  Filed Vitals:   05/13/14 1310  BP: 134/85  Pulse: 55  Temp:   Resp: 10    Post vital signs: Reviewed and stable  Level of consciousness: sedated  Complications: No apparent anesthesia complications

## 2014-05-16 ENCOUNTER — Other Ambulatory Visit: Payer: Self-pay | Admitting: Hematology and Oncology

## 2014-05-16 DIAGNOSIS — C099 Malignant neoplasm of tonsil, unspecified: Secondary | ICD-10-CM

## 2014-05-19 ENCOUNTER — Ambulatory Visit (HOSPITAL_COMMUNITY): Admission: RE | Admit: 2014-05-19 | Payer: Medicaid Other | Source: Ambulatory Visit

## 2014-05-21 ENCOUNTER — Ambulatory Visit: Payer: Medicaid Other | Attending: Internal Medicine | Admitting: Internal Medicine

## 2014-05-21 ENCOUNTER — Encounter: Payer: Self-pay | Admitting: Internal Medicine

## 2014-05-21 DIAGNOSIS — I10 Essential (primary) hypertension: Secondary | ICD-10-CM | POA: Insufficient documentation

## 2014-05-21 DIAGNOSIS — Z87891 Personal history of nicotine dependence: Secondary | ICD-10-CM | POA: Diagnosis not present

## 2014-05-21 DIAGNOSIS — Z8589 Personal history of malignant neoplasm of other organs and systems: Secondary | ICD-10-CM | POA: Insufficient documentation

## 2014-05-21 DIAGNOSIS — M25561 Pain in right knee: Secondary | ICD-10-CM

## 2014-05-21 DIAGNOSIS — R030 Elevated blood-pressure reading, without diagnosis of hypertension: Secondary | ICD-10-CM

## 2014-05-21 DIAGNOSIS — IMO0001 Reserved for inherently not codable concepts without codable children: Secondary | ICD-10-CM

## 2014-05-21 DIAGNOSIS — Z923 Personal history of irradiation: Secondary | ICD-10-CM | POA: Insufficient documentation

## 2014-05-21 DIAGNOSIS — Z9221 Personal history of antineoplastic chemotherapy: Secondary | ICD-10-CM | POA: Diagnosis not present

## 2014-05-21 MED ORDER — TRAMADOL HCL 50 MG PO TABS: 50.0000 mg | ORAL_TABLET | Freq: Three times a day (TID) | ORAL | Status: DC | PRN

## 2014-05-21 NOTE — Progress Notes (Signed)
MRN: [id number] Name: Mason [name]  Sex: male Age: 49 y.o. DOB: 04-07-1966  Allergies: Pollen extract  Chief Complaint  Patient presents with  . Follow-up    HPI: Patient is 49 y.o. male who history of throat cancer currently following up with  oncology status post surgery radiation therapy and chemotherapy, patient today complaining of right knee pain on and off for several weeks denies any recent fall or trauma, patient is requesting some pain medication to help him with the symptoms.also noticed today's blood pressure is elevated, he denies any acute symptoms denies any headache dizziness chest and shortness of breath, he does have family history of hypertension.  Past Medical History  Diagnosis Date  . 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 Knee pain, chronic   . Anxiety     mild new dx  . Arthritis     knees,hips  . Concussion     Hx: in high school x 2  . Complication of anesthesia     Pt stated " my oxygen level was slow in rising."  . Hypertension   . PEG (percutaneous endoscopic gastrostomy) status   . Malnutrition   . Bilateral edema of lower extremity   . Abnormal liver function test   . Anemia   . Constipation   . Hypokalemia   . Fever   . Hypoglycemia   . Renal failure, acute   . Severe nausea and vomiting   . Hyperactive gag reflex   . Status post chemotherapy     Only received 2 doses due to uncontrolled nausea and acute renal failure  . S/P radiation therapy 08/19/2013-10/15/2013    Right Tonstil and bilateral neck / 70 Gy in 35 fractions to gross disease, 63 Gy in 35 fractions to high risk nodal echelons, and 56 Gy in 35 fractions to intermediate risk nodal echelons    Past Surgical History  Procedure Laterality Date  . Multiple extractions with alveoloplasty N/A 08/01/2013    Procedure: Extraction of tooth #'s 1,15,17,31, 32 with alveoloplasty, mandibular left torus reduction, and gross debridement of remaining teeth.;  Surgeon:  [name], DDS;  Location: [facility];  Service: Oral Surgery;  Laterality: N/A;  . Laparoscopic gastrostomy N/A 08/15/2013    Procedure: LAPAROSCOPIC GASTROSTOMY TUBE PLACEMENT;  Surgeon: [name], MD;  Location: [place];  Service: General;  Laterality: N/A;  . Portacath placement Left 08/15/2013    Procedure: INSERTION PORT-A-CATH;  Surgeon: [name], MD;  Location: [place];  Service: General;  Laterality: Left;  . Lymph node biopsy  03/20/14    right neck  . Port-a-cath removal N/A 05/13/2014    Procedure: REMOVAL of PORT-A-CATH;  Surgeon: [name], MD;  Location: [place];  Service: General;  Laterality: N/A;      Medication List       This list is accurate as of: 05/21/14  5:05 PM.  Always use your most recent med list.               feeding supplement (OSMOLITE 1.5 CAL) Liqd  Increase Osmolite 1.5 to goal of 2 cans QID via feeding tube with 60 ml free water before and after each bolus feeding.  Drink or put 300 ml free water in tube TID.     oxyCODONE 5 MG immediate release tablet  Commonly known as:  ROXICODONE  Take 1 tablet (5 mg total) by mouth 3 (three) times daily as needed for severe pain.  oxyCODONE-acetaminophen 5-325 MG per tablet  Commonly known as:  ROXICET  Take 1-2 tablets by mouth every 4 (four) hours as needed.     traMADol 50 MG tablet  Commonly known as:  ULTRAM  Take 1 tablet (50 mg total) by mouth every 8 (eight) hours as needed for moderate pain.     vitamin B-12 100 MCG tablet  Commonly known as:  CYANOCOBALAMIN  Take 1 tablet (100 mcg total) by mouth daily.        Meds ordered this encounter  Medications  . traMADol (ULTRAM) 50 MG tablet    Sig: Take 1 tablet (50 mg total) by mouth every 8 (eight) hours as needed for moderate pain.    Dispense:  30 tablet    Refill:  0    Immunization History  Administered Date(s) Administered  . Influenza,inj,Quad PF,36+ Mos 12/31/2013    Family History  Problem Relation Age of Onset   . CVA    . Diabetes    . Heart attack    . Arthritis Father   . Arthritis Mother     History  Substance Use Topics  . Smoking status: Former Smoker -- 1.00 packs/day for 20 years    Types: Cigarettes    Quit date: 07/16/2003  . Smokeless tobacco: Never Used  . Alcohol Use: No     Comment: none years ago    Review of Systems   As noted in HPI  Filed Vitals:   05/21/14 1658  BP: 148/82  Pulse: 71  Temp: 98.2 F (36.8 C)  Resp: 15    Physical Exam  Physical Exam  Cardiovascular: Normal rate and regular rhythm.   Pulmonary/Chest: Breath sounds normal. No respiratory distress. He has no wheezes. He has no rales.  Musculoskeletal:  Right knee crepitation some tenderness anteriorly, good ROM     CBC    Component Value Date/Time   WBC 3.6* 05/12/2014 1438   WBC 4.6 01/30/2014 1447   RBC 3.91* 05/12/2014 1438   RBC 3.77* 01/30/2014 1447   HGB 10.8* 05/12/2014 1438   HGB 10.9* 01/30/2014 1447   HCT 33.8* 05/12/2014 1438   HCT 33.0* 01/30/2014 1447   PLT 206 05/12/2014 1438   PLT 189 01/30/2014 1447   MCV 86.4 05/12/2014 1438   MCV 87.5 01/30/2014 1447   LYMPHSABS 0.6* 01/30/2014 1447   LYMPHSABS 0.3* 09/30/2013 0522   MONOABS 0.4 01/30/2014 1447   MONOABS 0.2 09/30/2013 0522   EOSABS 0.1 01/30/2014 1447   EOSABS 0.0 09/30/2013 0522   BASOSABS 0.0 01/30/2014 1447   BASOSABS 0.0 09/30/2013 0522    CMP     Component Value Date/Time   NA 140 05/12/2014 1438   NA 141 01/30/2014 1447   K 4.2 05/12/2014 1438   K 3.8 01/30/2014 1447   CL 104 05/12/2014 1438   CO2 30 05/12/2014 1438   CO2 25 01/30/2014 1447   GLUCOSE 84 05/12/2014 1438   GLUCOSE 75 01/30/2014 1447   BUN 8 05/12/2014 1438   BUN 7.1 01/30/2014 1447   CREATININE 1.31 05/12/2014 1438   CREATININE 1.5* 01/30/2014 1447   CALCIUM 9.3 05/12/2014 1438   CALCIUM 9.5 01/30/2014 1447   PROT 6.7 01/30/2014 1447   PROT 7.5 09/30/2013 0522   ALBUMIN 3.9 01/30/2014 1447   ALBUMIN 3.4* 09/30/2013  0522   AST 15 01/30/2014 1447   AST 24 09/30/2013 0522   ALT 10 01/30/2014 1447   ALT 43 09/30/2013 0522   ALKPHOS 53 01/30/2014  1447   ALKPHOS 101 09/30/2013 0522   BILITOT 0.60 01/30/2014 1447   BILITOT 0.3 09/30/2013 0522   GFRNONAA 64* 05/12/2014 1438   GFRAA 74* 05/12/2014 1438    No results found for: CHOL  No components found for: HGA1C  Lab Results  Component Value Date/Time   AST 15 01/30/2014 02:47 PM   AST 24 09/30/2013 05:22 AM    Assessment and Plan  Right knee pain - Plan: I have ordered DG Knee Complete 4 Views Right, also prescribed  traMADol (ULTRAM) 50 MG tablet  Elevated BP I have advised patient for DASH diet.  Health Maintenance  -Vaccinations:  uptodate with flu shot    Return for follow up with Mateo Flow .  Lorayne Marek, MD

## 2014-05-21 NOTE — Patient Instructions (Signed)
DASH Eating Plan °DASH stands for "Dietary Approaches to Stop Hypertension." The DASH eating plan is a healthy eating plan that has been shown to reduce high blood pressure (hypertension). Additional health benefits may include reducing the risk of type 2 diabetes mellitus, heart disease, and stroke. The DASH eating plan may also help with weight loss. °WHAT DO I NEED TO KNOW ABOUT THE DASH EATING PLAN? °For the DASH eating plan, you will follow these general guidelines: °· Choose foods with a percent daily value for sodium of less than 5% (as listed on the food label). °· Use salt-free seasonings or herbs instead of table salt or sea salt. °· Check with your health care provider or pharmacist before using salt substitutes. °· Eat lower-sodium products, often labeled as "lower sodium" or "no salt added." °· Eat fresh foods. °· Eat more vegetables, fruits, and low-fat dairy products. °· Choose whole grains. Look for the word "whole" as the first word in the ingredient list. °· Choose fish and skinless chicken or turkey more often than red meat. Limit fish, poultry, and meat to 6 oz (170 g) each day. °· Limit sweets, desserts, sugars, and sugary drinks. °· Choose heart-healthy fats. °· Limit cheese to 1 oz (28 g) per day. °· Eat more home-cooked food and less restaurant, buffet, and fast food. °· Limit fried foods. °· Cook foods using methods other than frying. °· Limit canned vegetables. If you do use them, rinse them well to decrease the sodium. °· When eating at a restaurant, ask that your food be prepared with less salt, or no salt if possible. °WHAT FOODS CAN I EAT? °Seek help from a dietitian for individual calorie needs. °Grains °Whole grain or whole wheat bread. Brown rice. Whole grain or whole wheat pasta. Quinoa, bulgur, and whole grain cereals. Low-sodium cereals. Corn or whole wheat flour tortillas. Whole grain cornbread. Whole grain crackers. Low-sodium crackers. °Vegetables °Fresh or frozen vegetables  (raw, steamed, roasted, or grilled). Low-sodium or reduced-sodium tomato and vegetable juices. Low-sodium or reduced-sodium tomato sauce and paste. Low-sodium or reduced-sodium canned vegetables.  °Fruits °All fresh, canned (in natural juice), or frozen fruits. °Meat and Other Protein Products °Ground beef (85% or leaner), grass-fed beef, or beef trimmed of fat. Skinless chicken or turkey. Ground chicken or turkey. Pork trimmed of fat. All fish and seafood. Eggs. Dried beans, peas, or lentils. Unsalted nuts and seeds. Unsalted canned beans. °Dairy °Low-fat dairy products, such as skim or 1% milk, 2% or reduced-fat cheeses, low-fat ricotta or cottage cheese, or plain low-fat yogurt. Low-sodium or reduced-sodium cheeses. °Fats and Oils °Tub margarines without trans fats. Light or reduced-fat mayonnaise and salad dressings (reduced sodium). Avocado. Safflower, olive, or canola oils. Natural peanut or almond butter. °Other °Unsalted popcorn and pretzels. °The items listed above may not be a complete list of recommended foods or beverages. Contact your dietitian for more options. °WHAT FOODS ARE NOT RECOMMENDED? °Grains °White bread. White pasta. White rice. Refined cornbread. Bagels and croissants. Crackers that contain trans fat. °Vegetables °Creamed or fried vegetables. Vegetables in a cheese sauce. Regular canned vegetables. Regular canned tomato sauce and paste. Regular tomato and vegetable juices. °Fruits °Dried fruits. Canned fruit in light or heavy syrup. Fruit juice. °Meat and Other Protein Products °Fatty cuts of meat. Ribs, chicken wings, bacon, sausage, bologna, salami, chitterlings, fatback, hot dogs, bratwurst, and packaged luncheon meats. Salted nuts and seeds. Canned beans with salt. °Dairy °Whole or 2% milk, cream, half-and-half, and cream cheese. Whole-fat or sweetened yogurt. Full-fat   cheeses or blue cheese. Nondairy creamers and whipped toppings. Processed cheese, cheese spreads, or cheese  curds. °Condiments °Onion and garlic salt, seasoned salt, table salt, and sea salt. Canned and packaged gravies. Worcestershire sauce. Tartar sauce. Barbecue sauce. Teriyaki sauce. Soy sauce, including reduced sodium. Steak sauce. Fish sauce. Oyster sauce. Cocktail sauce. Horseradish. Ketchup and mustard. Meat flavorings and tenderizers. Bouillon cubes. Hot sauce. Tabasco sauce. Marinades. Taco seasonings. Relishes. °Fats and Oils °Butter, stick margarine, lard, shortening, ghee, and bacon fat. Coconut, palm kernel, or palm oils. Regular salad dressings. °Other °Pickles and olives. Salted popcorn and pretzels. °The items listed above may not be a complete list of foods and beverages to avoid. Contact your dietitian for more information. °WHERE CAN I FIND MORE INFORMATION? °[employer], [employer], [employer]: www.nhlbi.nih.gov/health/health-topics/topics/dash/ °Document Released: 03/24/2011 Document Revised: 08/19/2013 Document Reviewed: 02/06/2013 °ExitCare® Patient Information ©2015 [employer]. This information is not intended to replace advice given to you by your health care provider. Make sure you discuss any questions you have with your health care provider. ° °

## 2014-05-21 NOTE — Progress Notes (Signed)
Patient here for annual physical and follow up Has been following with the cancer center for throat cancer-stage 2 Recently had his port removed from left side of chest Still complains of pain to his right knee acting up

## 2014-05-22 ENCOUNTER — Ambulatory Visit (HOSPITAL_COMMUNITY): Payer: Medicaid Other

## 2014-05-22 ENCOUNTER — Encounter (HOSPITAL_COMMUNITY): Payer: Self-pay

## 2014-05-22 ENCOUNTER — Ambulatory Visit (HOSPITAL_COMMUNITY)
Admission: RE | Admit: 2014-05-22 | Discharge: 2014-05-22 | Disposition: A | Discharge status: Home / Self Care | Payer: Medicaid Other | Source: Ambulatory Visit | Attending: Hematology and Oncology | Admitting: Hematology and Oncology

## 2014-05-22 ENCOUNTER — Other Ambulatory Visit (HOSPITAL_BASED_OUTPATIENT_CLINIC_OR_DEPARTMENT_OTHER): Payer: Medicaid Other

## 2014-05-22 DIAGNOSIS — C099 Malignant neoplasm of tonsil, unspecified: Secondary | ICD-10-CM

## 2014-05-22 DIAGNOSIS — C9110 Chronic lymphocytic leukemia of B-cell type not having achieved remission: Secondary | ICD-10-CM

## 2014-05-22 DIAGNOSIS — Z923 Personal history of irradiation: Secondary | ICD-10-CM | POA: Diagnosis not present

## 2014-05-22 DIAGNOSIS — Z9221 Personal history of antineoplastic chemotherapy: Secondary | ICD-10-CM | POA: Diagnosis not present

## 2014-05-22 LAB — CBC WITH DIFFERENTIAL/PLATELET
BASO%: 0.2 % (ref 0.0–2.0)
Basophils Absolute: 0 10*3/uL (ref 0.0–0.1)
EOS%: 2 % (ref 0.0–7.0)
Eosinophils Absolute: 0.1 10*3/uL (ref 0.0–0.5)
HCT: 34.5 % — ABNORMAL LOW (ref 38.4–49.9)
HEMOGLOBIN: 10.8 g/dL — AB (ref 13.0–17.1)
LYMPH%: 10.7 % — AB (ref 14.0–49.0)
MCH: 27.1 pg — ABNORMAL LOW (ref 27.2–33.4)
MCHC: 31.3 g/dL — AB (ref 32.0–36.0)
MCV: 86.5 fL (ref 79.3–98.0)
MONO#: 0.5 10*3/uL (ref 0.1–0.9)
MONO%: 9.5 % (ref 0.0–14.0)
NEUT%: 77.6 % — ABNORMAL HIGH (ref 39.0–75.0)
NEUTROS ABS: 3.9 10*3/uL (ref 1.5–6.5)
Platelets: 219 10*3/uL (ref 140–400)
RBC: 3.99 10*6/uL — ABNORMAL LOW (ref 4.20–5.82)
RDW: 15.1 % — AB (ref 11.0–14.6)
WBC: 5 10*3/uL (ref 4.0–10.3)
lymph#: 0.5 10*3/uL — ABNORMAL LOW (ref 0.9–3.3)

## 2014-05-22 LAB — COMPREHENSIVE METABOLIC PANEL (CC13)
ALT: 9 U/L (ref 0–55)
AST: 15 U/L (ref 5–34)
Albumin: 3.8 g/dL (ref 3.5–5.0)
Alkaline Phosphatase: 61 U/L (ref 40–150)
Anion Gap: 6 mEq/L (ref 3–11)
BILIRUBIN TOTAL: 0.36 mg/dL (ref 0.20–1.20)
BUN: 8.7 mg/dL (ref 7.0–26.0)
CALCIUM: 8.8 mg/dL (ref 8.4–10.4)
CHLORIDE: 107 meq/L (ref 98–109)
CO2: 28 mEq/L (ref 22–29)
CREATININE: 1.4 mg/dL — AB (ref 0.7–1.3)
EGFR: 69 mL/min/{1.73_m2} — ABNORMAL LOW (ref >90)
Glucose: 81 mg/dl (ref 70–140)
POTASSIUM: 4.2 meq/L (ref 3.5–5.1)
Sodium: 142 mEq/L (ref 136–145)
TOTAL PROTEIN: 6.9 g/dL (ref 6.4–8.3)

## 2014-05-22 MED ORDER — IOHEXOL 300 MG/ML  SOLN
80.0000 mL | Freq: Once | INTRAMUSCULAR | Status: AC | PRN
  Administered 2014-05-22: 80 mL via INTRAVENOUS

## 2014-05-23 ENCOUNTER — Ambulatory Visit (HOSPITAL_BASED_OUTPATIENT_CLINIC_OR_DEPARTMENT_OTHER): Payer: Medicaid Other | Admitting: Hematology and Oncology

## 2014-05-23 ENCOUNTER — Telehealth: Payer: Self-pay | Admitting: Hematology and Oncology

## 2014-05-23 ENCOUNTER — Encounter: Payer: Self-pay | Admitting: Hematology and Oncology

## 2014-05-23 VITALS — BP 132/87 | HR 79 | Temp 98.0°F | Resp 18 | Ht 79.0 in | Wt 224.7 lb

## 2014-05-23 DIAGNOSIS — C099 Malignant neoplasm of tonsil, unspecified: Secondary | ICD-10-CM

## 2014-05-23 DIAGNOSIS — N19 Unspecified kidney failure: Secondary | ICD-10-CM

## 2014-05-23 DIAGNOSIS — T8189XA Other complications of procedures, not elsewhere classified, initial encounter: Secondary | ICD-10-CM

## 2014-05-23 DIAGNOSIS — D630 Anemia in neoplastic disease: Secondary | ICD-10-CM

## 2014-05-23 HISTORY — DX: Other complications of procedures, not elsewhere classified, initial encounter: T81.89XA

## 2014-05-23 MED ORDER — OXYCODONE-ACETAMINOPHEN 5-325 MG PO TABS: 1.0000 | ORAL_TABLET | ORAL | Status: DC | PRN

## 2014-05-23 NOTE — Assessment & Plan Note (Signed)
This is likely anemia of chronic disease. The patient denies recent history of bleeding such as epistaxis, hematuria or hematochezia. He is asymptomatic from the anemia. We will observe for now.  He does not require transfusion now.   

## 2014-05-23 NOTE — Assessment & Plan Note (Signed)
Repeat CT scan of the neck just show postsurgical changes. I will get his case to be reviewed at the next ENT conference. In the meantime, I plan to see him back next month for further assessment and make sure that his wound has completely healed.

## 2014-05-23 NOTE — Assessment & Plan Note (Signed)
He has signs of dehydration with renal failure with the recent blood draw. Recommend increase oral fluid intake.

## 2014-05-23 NOTE — Assessment & Plan Note (Addendum)
I placed new dressing over the wound. Recommend he contact his ENT surgeon for further assessment as he has been almost 2 months and the wound is still not healed. I gave him prescription Percocet to take as needed for pain.

## 2014-05-23 NOTE — Telephone Encounter (Signed)
gv and printed appt sched anda vs for pt for March.... °

## 2014-05-23 NOTE — Progress Notes (Signed)
[place] OFFICE PROGRESS NOTE  Patient Care Team: Lance Bosch, NP as PCP - General (Internal Medicine) [name] Patient (General Practice) Brooks Sailors, RN as Registered Nurse (Oncology) [name], MD as Referring Physician (Otolaryngology) [name], MD as Consulting Physician (Hematology and Oncology)  SUMMARY OF ONCOLOGIC HISTORY: Oncology History   Tonsillar cancer, HPV positive   Primary site: Pharynx - Oropharynx (Right)   Staging method: AJCC 7th Edition   Clinical: Stage IVA (T2, N2b, M0) signed by [name], MD on 08/19/2013  1:24 PM   Summary: Stage IVA (T2, N2b, M0)       Tonsillar cancer   07/09/2013 Procedure 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 FNA of right level III lymph node was inconclusive for cancer   07/25/2013 Imaging PET scan showed locally advanced disease with abnormal lymphadenopathy in the right axilla   08/06/2013 Procedure CT-guided biopsy of the lymphadenopathy was negative for malignancy   08/15/2013 Surgery Patient has placement of port and feeding tube   08/19/2013 - 09/10/2013 Chemotherapy Patient received chemotherapy with cisplatin. The patient only received 2 doses due to uncontrolled nausea and acute renal failure.   08/19/2013 - 10/15/2013 Radiation Therapy Patient received radiation treatment   08/27/2013 - 08/30/2013 Hospital Admission The patient was admitted to the hospital for uncontrolled nausea vomiting and dehydration.   02/14/2014 Imaging PET/CT scan showed complete response to treatment   03/19/2014 Surgery He had excisional lymph node biopsy from the right neck. Pathology was benign   05/13/2014 Surgery He had removal of Port-A-Cath.   05/22/2014 Imaging Repeat CT scan of the neck show no evidence of disease recurrence.    INTERVAL HISTORY: Please see below for problem oriented charting. He is seen today for further follow-up. He had recent lymph node biopsy from the  neck 2 months ago and still have persistent nonhealing wound. He complained of pain in the neck. History of difficulties opening his mouth because of pain. He denies dysphagia.  REVIEW OF SYSTEMS:   Constitutional: Denies fevers, chills Eyes: Denies blurriness of vision Ears, nose, mouth, throat, and face: Denies mucositis or sore throat Respiratory: Denies cough, dyspnea or wheezes Cardiovascular: Denies palpitation, chest discomfort or lower extremity swelling Gastrointestinal:  Denies nausea, heartburn or change in bowel habits Skin: Denies abnormal skin rashes Lymphatics: Denies new lymphadenopathy or easy bruising Neurological:Denies numbness, tingling or new weaknesses Behavioral/Psych: Mood is stable, no new changes  All other systems were reviewed with the patient and are negative.  I have reviewed the past medical history, past surgical history, social history and family history with the patient and they are unchanged from previous note.  ALLERGIES:  is allergic to pollen extract.  MEDICATIONS:  Current Outpatient Prescriptions  Medication Sig Dispense Refill  . oxyCODONE (ROXICODONE) 5 MG immediate release tablet Take 1 tablet (5 mg total) by mouth 3 (three) times daily as needed for severe pain. (Patient not taking: Reported on 05/09/2014) 60 tablet 0  . oxyCODONE-acetaminophen (ROXICET) 5-325 MG per tablet Take 1 tablet by mouth every 4 (four) hours as needed. 60 tablet 0  . vitamin B-12 (CYANOCOBALAMIN) 100 MCG tablet Take 1 tablet (100 mcg total) by mouth daily. (Patient not taking: Reported on 05/09/2014) 30 tablet 4   No current facility-administered medications for this visit.    PHYSICAL EXAMINATION: ECOG PERFORMANCE STATUS: 1 - Symptomatic but completely ambulatory  Filed Vitals:   05/23/14 1044  BP: 132/87  Pulse: 79  Temp: 98 F (36.7 C)  Resp: 18   Filed Weights   05/23/14 1044  Weight: 224 lb 11.2 oz (101.923 kg)    GENERAL:alert, no distress and  comfortable SKIN: He has a large wound on the right side of the neck in the phase of healing. No evidence of cellulitis. I placed new bandages over it EYES: normal, Conjunctiva are pink and non-injected, sclera clear OROPHARYNX:no exudate, no erythema and lips, buccal mucosa, and tongue normal  NECK: supple, thyroid normal size, non-tender, without nodularity LYMPH:  no palpable lymphadenopathy in the cervical, axillary or inguinal LUNGS: clear to auscultation and percussion with normal breathing effort HEART: regular rate & rhythm and no murmurs and no lower extremity edema ABDOMEN:abdomen soft, non-tender and normal bowel sounds Musculoskeletal:no cyanosis of digits and no clubbing  NEURO: alert & oriented x 3 with fluent speech, no focal motor/sensory deficits  LABORATORY DATA:  I have reviewed the data as listed    Component Value Date/Time   NA 142 05/22/2014 0846   NA 140 05/12/2014 1438   K 4.2 05/22/2014 0846   K 4.2 05/12/2014 1438   CL 104 05/12/2014 1438   CO2 28 05/22/2014 0846   CO2 30 05/12/2014 1438   GLUCOSE 81 05/22/2014 0846   GLUCOSE 84 05/12/2014 1438   BUN 8.7 05/22/2014 0846   BUN 8 05/12/2014 1438   CREATININE 1.4* 05/22/2014 0846   CREATININE 1.31 05/12/2014 1438   CALCIUM 8.8 05/22/2014 0846   CALCIUM 9.3 05/12/2014 1438   PROT 6.9 05/22/2014 0846   PROT 7.5 09/30/2013 0522   ALBUMIN 3.8 05/22/2014 0846   ALBUMIN 3.4* 09/30/2013 0522   AST 15 05/22/2014 0846   AST 24 09/30/2013 0522   ALT 9 05/22/2014 0846   ALT 43 09/30/2013 0522   ALKPHOS 61 05/22/2014 0846   ALKPHOS 101 09/30/2013 0522   BILITOT 0.36 05/22/2014 0846   BILITOT 0.3 09/30/2013 0522   GFRNONAA 64* 05/12/2014 1438   GFRAA 74* 05/12/2014 1438    No results found for: SPEP, UPEP  Lab Results  Component Value Date   WBC 5.0 05/22/2014   NEUTROABS 3.9 05/22/2014   HGB 10.8* 05/22/2014   HCT 34.5* 05/22/2014   MCV 86.5 05/22/2014   PLT 219 05/22/2014      Chemistry       Component Value Date/Time   NA 142 05/22/2014 0846   NA 140 05/12/2014 1438   K 4.2 05/22/2014 0846   K 4.2 05/12/2014 1438   CL 104 05/12/2014 1438   CO2 28 05/22/2014 0846   CO2 30 05/12/2014 1438   BUN 8.7 05/22/2014 0846   BUN 8 05/12/2014 1438   CREATININE 1.4* 05/22/2014 0846   CREATININE 1.31 05/12/2014 1438      Component Value Date/Time   CALCIUM 8.8 05/22/2014 0846   CALCIUM 9.3 05/12/2014 1438   ALKPHOS 61 05/22/2014 0846   ALKPHOS 101 09/30/2013 0522   AST 15 05/22/2014 0846   AST 24 09/30/2013 0522   ALT 9 05/22/2014 0846   ALT 43 09/30/2013 0522   BILITOT 0.36 05/22/2014 0846   BILITOT 0.3 09/30/2013 0522       RADIOGRAPHIC STUDIES: I have personally reviewed the radiological images as listed and agreed with the findings in the report. Ct Soft Tissue Neck W Contrast  05/22/2014   CLINICAL DATA:  Personal history of right tonsillar squamous cell carcinoma status post chemotherapy and radiation therapy in 2015. Surgical biopsy of lymph node in the right  neck on 03/19/2014 without evidence of residual malignancy.  EXAM: CT NECK WITH CONTRAST  TECHNIQUE: Multidetector CT imaging of the neck was performed using the standard protocol following the bolus administration of intravenous contrast.  CONTRAST:  57mL OMNIPAQUE IOHEXOL 300 MG/ML  SOLN  COMPARISON:  PET-CT 02/14/2014.  Neck CT 07/25/2013.  FINDINGS: Pharynx and larynx: Small amount of secretions are noted posteriorly in the nasopharynx. Diffuse edema is present in the oropharynx and hypopharynx compatible with sequelae of prior radiation therapy. There is mild asymmetry of the right palatine tonsil without discrete recurrent mass identified. Larynx is unremarkable aside from mild supraglottic edema.  Salivary glands: Postradiation changes in the submandibular glands. Parotid glands are unremarkable.  Thyroid: Unremarkable.  Lymph nodes: There is new soft tissue stranding along the anterior and medial margins of the  sternocleidomastoid muscle in the right upper neck, likely secondary to interval surgery for lymph node biopsy/ excision with 2 surgical clips noted nearby. There is a single small locule of gas in this region. Intermediate density fluid collection along the anterior margin of the SCM measures approximately 3.4 x 1.4 cm and may reflect resolving postoperative hematoma. Stranding extends into the right parapharyngeal space. A low-density, peripherally calcified right level II/III lymph node deep to the SCM measures 1.3 cm in short axis (series 2, image 72, previously 2.1 cm). Right level III lymph node immediately inferior to the calcified node on the prior PET-CT is no longer identified. No enlarged lymph nodes are identified elsewhere in the neck. Diffuse fat stranding in the lower face and neck is compatible with radiation therapy.  Vascular: Right jugular bulb and inferior right internal jugular vein are patent, however the right internal jugular vein in the mid and upper neck is either severely compressed or occluded. Other major vascular structures of the neck appear patent.  Limited intracranial: Visualized portion of the brain is unremarkable.  Visualized orbits: Unremarkable.  Mastoids and visualized paranasal sinuses: Minimal right ethmoid air cell and left maxillary sinus mucosal thickening. Mild mucosal thickening and mucous retention cyst in the right maxillary sinus. Mastoid air cells are clear.  Skeleton: Mild-to-moderate cervical spondylosis is noted. No lytic or blastic osseous lesions are identified.  Upper chest: The visualized lung apices are clear.  IMPRESSION: 1. Interval postsurgical changes in the right upper neck with small fluid collection as above. Decreased size of a peripherally calcified right level II/ III lymph node. Other level III lymph node on prior PET-CT is no longer seen. No new lymphadenopathy. 2. Postradiation changes in the neck. No recurrent tonsil mass identified. 3.  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   Electronically Signed   By: Logan Bores   On: 05/22/2014 10:21     ASSESSMENT & PLAN:  Tonsillar cancer Repeat CT scan of the neck just show postsurgical changes. I will get his case to be reviewed at the next ENT conference. In the meantime, I plan to see him back next month for further assessment and make sure that his wound has completely healed.   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 The patient denies recent history of bleeding such as epistaxis, hematuria or hematochezia. He is asymptomatic from the anemia. We will observe for now.  He does not require transfusion now.     Non-healing surgical wound I placed new dressing over the wound. Recommend he contact his ENT surgeon for further assessment as he has been almost  2 months and the wound is still not healed. I gave him prescription Percocet to take as needed for pain.   Prerenal renal failure He has signs of dehydration with renal failure with the recent blood draw. Recommend increase oral fluid intake.    No orders of the defined types were placed in this encounter.   All questions were answered. The patient knows to call the clinic with any problems, questions or concerns. No barriers to learning was detected. I spent 30 minutes counseling the patient face to face. The total time spent in the appointment was 40 minutes and more than 50% was on counseling and review of test results     [facility], Bernice, MD 05/23/2014 12:48 PM

## 2014-06-09 ENCOUNTER — Telehealth: Payer: Self-pay | Admitting: *Deleted

## 2014-06-09 NOTE — Telephone Encounter (Signed)
PT. STATES DR.GORSUCH WAS TO CONTACT DR.GORE CONCERNING AN APPOINTMENT FOR PT SINCE SHE WANTED TO TALK WITH DR.GORE.

## 2014-06-09 NOTE — Telephone Encounter (Signed)
We review his case at the tumor board, results OK. He still need routine surveillance. He needs to see Dr. Simeon Craft also for pain issue from recent surgery Can you see if Simeon Craft will see him back.

## 2014-06-09 NOTE — Telephone Encounter (Signed)
Dr Theressa Millard office to call patient to schedule appt

## 2014-06-10 ENCOUNTER — Telehealth: Payer: Self-pay | Admitting: *Deleted

## 2014-06-10 NOTE — Telephone Encounter (Signed)
Spoke with [facility].  She confirmed that patient was seen by Dr. Simeon Craft this morning.  Gayleen Orem, RN, BSN, [place] at [place] [phone]

## 2014-06-27 ENCOUNTER — Telehealth: Payer: Self-pay | Admitting: Hematology and Oncology

## 2014-06-27 ENCOUNTER — Ambulatory Visit (HOSPITAL_BASED_OUTPATIENT_CLINIC_OR_DEPARTMENT_OTHER): Payer: Medicaid Other | Admitting: Hematology and Oncology

## 2014-06-27 ENCOUNTER — Telehealth: Payer: Self-pay | Admitting: *Deleted

## 2014-06-27 VITALS — BP 144/78 | HR 72 | Temp 98.3°F | Resp 18 | Ht 79.0 in | Wt 224.2 lb

## 2014-06-27 DIAGNOSIS — T8189XD Other complications of procedures, not elsewhere classified, subsequent encounter: Secondary | ICD-10-CM

## 2014-06-27 DIAGNOSIS — M436 Torticollis: Secondary | ICD-10-CM | POA: Insufficient documentation

## 2014-06-27 DIAGNOSIS — C099 Malignant neoplasm of tonsil, unspecified: Secondary | ICD-10-CM

## 2014-06-27 DIAGNOSIS — R1312 Dysphagia, oropharyngeal phase: Secondary | ICD-10-CM | POA: Insufficient documentation

## 2014-06-27 DIAGNOSIS — I890 Lymphedema, not elsewhere classified: Secondary | ICD-10-CM | POA: Insufficient documentation

## 2014-06-27 MED ORDER — OXYCODONE-ACETAMINOPHEN 5-325 MG PO TABS: 1.0000 | ORAL_TABLET | Freq: Three times a day (TID) | ORAL | Status: DC | PRN

## 2014-06-27 NOTE — Progress Notes (Signed)
[facility] OFFICE PROGRESS NOTE  Patient Care Team: Lance Bosch, NP as PCP - General (Internal Medicine) [name] Patient (General Practice) [name], RN as Registered Nurse (Oncology) [name], MD as Referring Physician (Otolaryngology) [name], MD as Consulting Physician (Hematology and Oncology)  SUMMARY OF ONCOLOGIC HISTORY: Oncology History   Tonsillar cancer, HPV positive   Primary site: Pharynx - Oropharynx (Right)   Staging method: AJCC 7th Edition   Clinical: Stage IVA (T2, N2b, M0) signed by [name], MD on 08/19/2013  1:24 PM   Summary: Stage IVA (T2, N2b, M0)       Tonsillar cancer   07/09/2013 Procedure 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 FNA of right level III lymph node was inconclusive for cancer   07/25/2013 Imaging PET scan showed locally advanced disease with abnormal lymphadenopathy in the right axilla   08/06/2013 Procedure CT-guided biopsy of the lymphadenopathy was negative for malignancy   08/15/2013 Surgery Patient has placement of port and feeding tube   08/19/2013 - 09/10/2013 Chemotherapy Patient received chemotherapy with cisplatin. The patient only received 2 doses due to uncontrolled nausea and acute renal failure.   08/19/2013 - 10/15/2013 Radiation Therapy Patient received radiation treatment   08/27/2013 - 08/30/2013 Hospital Admission The patient was admitted to the hospital for uncontrolled nausea vomiting and dehydration.   02/14/2014 Imaging PET/CT scan showed complete response to treatment   03/19/2014 Surgery He had excisional lymph node biopsy from the right neck. Pathology was benign   05/13/2014 Surgery He had removal of Port-A-Cath.   05/22/2014 Imaging Repeat CT scan of the neck show no evidence of disease recurrence.    INTERVAL HISTORY: Please see below for problem oriented charting. He is seen today for further review and management of nonhealing wound. He  complained a limitation with neck movement and significant pain around the jaw area. His wound has improved and heal completely without discharge. He has significant neck stiffness and arm movement. He also have some dysphagia but no recent aspiration or choking.  REVIEW OF SYSTEMS:   Constitutional: Denies fevers, chills or abnormal weight loss Eyes: Denies blurriness of vision Ears, nose, mouth, throat, and face: Denies mucositis or sore throat Respiratory: Denies cough, dyspnea or wheezes Cardiovascular: Denies palpitation, chest discomfort or lower extremity swelling Gastrointestinal:  Denies nausea, heartburn or change in bowel habits Skin: Denies abnormal skin rashes Lymphatics: Denies new lymphadenopathy or easy bruising Neurological:Denies numbness, tingling or new weaknesses Behavioral/Psych: Mood is stable, no new changes  All other systems were reviewed with the patient and are negative.  I have reviewed the past medical history, past surgical history, social history and family history with the patient and they are unchanged from previous note.  ALLERGIES:  is allergic to pollen extract.  MEDICATIONS:  Current Outpatient Prescriptions  Medication Sig Dispense Refill  . ibuprofen (ADVIL,MOTRIN) 200 MG tablet Take 200 mg by mouth every 6 (six) hours as needed.    . naproxen sodium (ANAPROX) 220 MG tablet Take 220 mg by mouth 2 (two) times daily as needed.    Marland Kitchen oxyCODONE-acetaminophen (PERCOCET/ROXICET) 5-325 MG per tablet Take 1 tablet by mouth every 8 (eight) hours as needed for severe pain. 90 tablet 0   No current facility-administered medications for this visit.    PHYSICAL EXAMINATION: ECOG PERFORMANCE STATUS: 1 - Symptomatic but completely ambulatory  Filed Vitals:   06/27/14 1232  BP: 144/78  Pulse: 72  Temp: 98.3 F (  36.8 C)  Resp: 18   Filed Weights   06/27/14 1232  Weight: 224 lb 3.2 oz (101.696 kg)    GENERAL:alert, no distress and comfortable SKIN:  skin color, texture, turgor are normal, no rashes or significant lesions EYES: normal, Conjunctiva are pink and non-injected, sclera clear OROPHARYNX:no exudate, no erythema and lips, buccal mucosa, and tongue normal  NECK:  Previous wound has healed. Significant swelling and skin tightness around his neck and face. He had limited neck and right arm movement LYMPH:  no palpable lymphadenopathy in the cervical, axillary or inguinal LUNGS: clear to auscultation and percussion with normal breathing effort HEART: regular rate & rhythm and no murmurs and no lower extremity edema ABDOMEN:abdomen soft, non-tender and normal bowel sounds Musculoskeletal:no cyanosis of digits and no clubbing  NEURO: alert & oriented x 3 with fluent speech, no focal motor/sensory deficits  LABORATORY DATA:  I have reviewed the data as listed    Component Value Date/Time   NA 142 05/22/2014 0846   NA 140 05/12/2014 1438   K 4.2 05/22/2014 0846   K 4.2 05/12/2014 1438   CL 104 05/12/2014 1438   CO2 28 05/22/2014 0846   CO2 30 05/12/2014 1438   GLUCOSE 81 05/22/2014 0846   GLUCOSE 84 05/12/2014 1438   BUN 8.7 05/22/2014 0846   BUN 8 05/12/2014 1438   CREATININE 1.4* 05/22/2014 0846   CREATININE 1.31 05/12/2014 1438   CALCIUM 8.8 05/22/2014 0846   CALCIUM 9.3 05/12/2014 1438   PROT 6.9 05/22/2014 0846   PROT 7.5 09/30/2013 0522   ALBUMIN 3.8 05/22/2014 0846   ALBUMIN 3.4* 09/30/2013 0522   AST 15 05/22/2014 0846   AST 24 09/30/2013 0522   ALT 9 05/22/2014 0846   ALT 43 09/30/2013 0522   ALKPHOS 61 05/22/2014 0846   ALKPHOS 101 09/30/2013 0522   BILITOT 0.36 05/22/2014 0846   BILITOT 0.3 09/30/2013 0522   GFRNONAA 64* 05/12/2014 1438   GFRAA 74* 05/12/2014 1438    No results found for: SPEP, UPEP  Lab Results  Component Value Date   WBC 5.0 05/22/2014   NEUTROABS 3.9 05/22/2014   HGB 10.8* 05/22/2014   HCT 34.5* 05/22/2014   MCV 86.5 05/22/2014   PLT 219 05/22/2014      Chemistry       Component Value Date/Time   NA 142 05/22/2014 0846   NA 140 05/12/2014 1438   K 4.2 05/22/2014 0846   K 4.2 05/12/2014 1438   CL 104 05/12/2014 1438   CO2 28 05/22/2014 0846   CO2 30 05/12/2014 1438   BUN 8.7 05/22/2014 0846   BUN 8 05/12/2014 1438   CREATININE 1.4* 05/22/2014 0846   CREATININE 1.31 05/12/2014 1438      Component Value Date/Time   CALCIUM 8.8 05/22/2014 0846   CALCIUM 9.3 05/12/2014 1438   ALKPHOS 61 05/22/2014 0846   ALKPHOS 101 09/30/2013 0522   AST 15 05/22/2014 0846   AST 24 09/30/2013 0522   ALT 9 05/22/2014 0846   ALT 43 09/30/2013 0522   BILITOT 0.36 05/22/2014 0846   BILITOT 0.3 09/30/2013 0522      ASSESSMENT & PLAN:  Tonsillar cancer Repeat CT scan of the neck just show postsurgical changes. The neck wound had healed but he is having significant pain due to skin tightness and lymphedema. We have a long discussion about plan of care. Patient is not interested to start neuropathic pain medication such as gabapentin. He is interested to try physical  therapy first. I will get assistance from the navigator to refer him to physical therapist. I prescribed pain medicine for him for another month and continue supportive care.   Lymphedema of face  He has significant lymphedema and limited neck movement related to pain. I will refer him for physical therapy for exercise.   Dysphagia, oropharyngeal  He has significant oropharyngeal dysphagia from prior treatment. He will continue swallow exercises. He denies recent choking sensation.   Neck stiffness  He has significant neck stiffness and limitation of movement due to lymphedema and pain. Again, I recommend physical therapy. The patient requested pain medication prescription. I gave him prescription supply for one month. We discussed narcotic refill policy.    No orders of the defined types were placed in this encounter.   All questions were answered. The patient knows to call the clinic  with any problems, questions or concerns. No barriers to learning was detected. I spent 20 minutes counseling the patient face to face. The total time spent in the appointment was 30 minutes and more than 50% was on counseling and review of test results     [facility] [place], MD 06/27/2014 1:27 PM

## 2014-06-27 NOTE — Assessment & Plan Note (Signed)
He has significant oropharyngeal dysphagia from prior treatment. He will continue swallow exercises. He denies recent choking sensation.

## 2014-06-27 NOTE — Telephone Encounter (Signed)
In follow-up to patient's appt with Dr. Alvy Bimler today and indication he is need of lymphedema PT, I called patient pt to check on his availability to attend [facility] next week Wednesday to meet with Serafina Royals, PT, for follow-up assessment.  He indicated he is available, understands I will contact him on Monday next week with an appt time.  Gayleen Orem, RN, BSN, [place] at [place] [phone]

## 2014-06-27 NOTE — Assessment & Plan Note (Signed)
He has significant lymphedema and limited neck movement related to pain. I will refer him for physical therapy for exercise.

## 2014-06-27 NOTE — Telephone Encounter (Signed)
gv adn printed appt sched and avs for pt for April  °

## 2014-06-27 NOTE — Assessment & Plan Note (Signed)
Repeat CT scan of the neck just show postsurgical changes. The neck wound had healed but he is having significant pain due to skin tightness and lymphedema. We have a long discussion about plan of care. Patient is not interested to start neuropathic pain medication such as gabapentin. He is interested to try physical therapy first. I will get assistance from the navigator to refer him to physical therapist. I prescribed pain medicine for him for another month and continue supportive care.

## 2014-06-27 NOTE — Assessment & Plan Note (Signed)
He has significant neck stiffness and limitation of movement due to lymphedema and pain. Again, I recommend physical therapy. The patient requested pain medication prescription. I gave him prescription supply for one month. We discussed narcotic refill policy.

## 2014-06-30 NOTE — Telephone Encounter (Signed)
Thanks

## 2014-07-01 ENCOUNTER — Telehealth: Payer: Self-pay | Admitting: *Deleted

## 2014-07-01 NOTE — Telephone Encounter (Signed)
I provided patient a 3:00 arrival to Radiation Oncology Waiting for his [facility] attendance to see Serafina Royals, [place], for follow-up lymphedema PT.  He verbalized understanding.  Gayleen Orem, RN, BSN, [place] at [place] [phone]

## 2014-07-02 ENCOUNTER — Ambulatory Visit: Payer: Medicaid Other | Admitting: Nutrition

## 2014-07-02 ENCOUNTER — Ambulatory Visit: Payer: Medicaid Other | Attending: Radiation Oncology | Admitting: Physical Therapy

## 2014-07-02 DIAGNOSIS — R52 Pain, unspecified: Secondary | ICD-10-CM | POA: Diagnosis not present

## 2014-07-02 DIAGNOSIS — M436 Torticollis: Secondary | ICD-10-CM | POA: Diagnosis not present

## 2014-07-02 DIAGNOSIS — I890 Lymphedema, not elsewhere classified: Secondary | ICD-10-CM

## 2014-07-02 DIAGNOSIS — R29898 Other symptoms and signs involving the musculoskeletal system: Secondary | ICD-10-CM

## 2014-07-02 NOTE — Progress Notes (Signed)
Follow-up completed with patient in radiation therapy. Patient's feeding tube was removed. Neck wound has improved. Some dysphasia remains.  Patient complains of continuing thick saliva and taste alterations. Weight documented as 224.2 pounds on March 11.  BMI 25.25. Weight has been stable the past 6 weeks.  Educated patient on strategies for improving taste alterations and thinning down thick saliva.  Encouraged continued increased fluid intake. Educated patient on maintaining this healthy body weight.  Patient is agreeable. Encouraged increased activity to improve fatigue. Questions were answered.  Teach back method used.  **Disclaimer: This note was dictated with voice recognition software. Similar sounding words can inadvertently be transcribed and this note may contain transcription errors which may not have been corrected upon publication of note.**

## 2014-07-03 NOTE — Therapy (Addendum)
[place], [place], [address] Phone: [phone]   Fax:  [phone]  Physical Therapy Evaluation  Patient Details  Name: JAMY CLECKLER MRN: [id number] Date of Birth: Jan 03, 1966 Referring Provider:  [name], MD  Encounter Date: 07/02/2014    Past Medical History  Diagnosis Date  . Knee pain, chronic   . Anxiety     mild new dx  . Arthritis     knees,hips  . Concussion     Hx: in high school x 2  . Hypertension   . PEG (percutaneous endoscopic gastrostomy) status ([place])   . Malnutrition ([place])   . Bilateral edema of lower extremity   . Abnormal liver function test   . Anemia   . Constipation   . Hypokalemia   . Fever   . Hypoglycemia   . Renal failure, acute ([place])   . Severe nausea and vomiting   . Hyperactive gag reflex   . Status post chemotherapy     Only received 2 doses due to uncontrolled nausea and acute renal failure  . S/P radiation therapy 08/19/2013-10/15/2013    Right Tonstil and bilateral neck / 70 Gy in 35 fractions to gross disease, 63 Gy in 35 fractions to high risk nodal echelons, and 56 Gy in 35 fractions to intermediate risk nodal echelons  . Non-healing surgical wound 05/23/2014  . Complication of anesthesia     Pt stated " my oxygen level was slow in rising."  . 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 Acute sinusitis, unspecified 05/18/2015  . Insomnia 06/15/2015    Past Surgical History  Procedure Laterality Date  . Multiple extractions with alveoloplasty N/A 08/01/2013    Procedure: Extraction of tooth #'s 1,15,17,31, 32 with alveoloplasty, mandibular left torus reduction, and gross debridement of remaining teeth.;  Surgeon: [name], DDS;  Location: [facility];  Service: Oral Surgery;  Laterality: N/A;  . Laparoscopic gastrostomy N/A 08/15/2013    Procedure: LAPAROSCOPIC GASTROSTOMY TUBE PLACEMENT;  Surgeon: [name], MD;   Location: [facility];  Service: General;  Laterality: N/A;  . Portacath placement Left 08/15/2013    Procedure: INSERTION PORT-A-CATH;  Surgeon: [name], MD;  [facility];  Service: General;  Laterality: Left;  . Lymph node biopsy  03/20/14    right neck  . Port-a-cath removal N/A 05/13/2014    Procedure: REMOVAL of PORT-A-CATH;  Surgeon: [name], MD;  Location: [facility];  Service: General;  Laterality: N/A;    There were no vitals filed for this visit.  Visit Diagnosis:  Lymphedema of face - Plan: PT plan of care cert/re-cert  Stiffness of neck - Plan: PT plan of care cert/re-cert  Pain - Plan: PT plan of care cert/re-cert  Weakness of shoulder - Plan: PT plan of care cert/re-cert                          Also discussed with patient what therapy can offer for his limitations, including instruction in self-manual lymph drainage, in soft tissue mobilization and stretches.  Because patient has [employer], I had to discuss with him that Medicaid is quite restrictive about which diagnoses it will authorize physical therapy treatment for and that if treatment is authorized, it will only cover 3 treatment sessions per calendar year.  I do feel that he would benefit from more therapy than this.  He may be  willing to incur billing for more sessions, but he also has the option of seeking free physical therapy treatment at the [facility] clinic in [place], supervised by licensed physical therapists and staffed by student physical therapists.  Currently I will seek treatment authorization from Medicaid.           [facility] Goals - 07/03/14 0906    CC Long Term Goal  #1   Title Patient will be independent in performing self-manual lymph drainage.   Baseline No knowledge   Time 3   Period Weeks   Status New   CC Long Term Goal  #2   Title Patient will be independent in managing right facial swelling.   Baseline No knowledge   Time 3   Period Weeks    Status New   CC Long Term Goal  #3   Title Patient will be independent in home exercise program for stretching tight soft tissue.   Baseline Limited knowledge.   Time 3   Period Weeks   Status New   CC Long Term Goal  #4   Title Patient will be independent in self-mobilization of soft tissue tightness at right jaw and neck.   Baseline No knowledge.   Time 3   Period Weeks   Status New             Problem List Patient Active Problem List   Diagnosis Date Noted  . Insomnia 06/15/2015  . Acute gout involving toe of right foot 05/25/2015  . Acute sinusitis, unspecified 05/18/2015  . Pancytopenia due to antineoplastic chemotherapy ([place]) 04/27/2015  . Primary cancer of tonsillar fossa ([place]) 03/20/2015  . Cancer associated pain 02/12/2015  . Lung nodule, multiple 12/30/2014  . 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  . Chronic kidney disease (CKD), stage II (mild) 12/02/2014  . Abdominal wall pain in left upper quadrant 09/30/2014  . Acquired dysphasia 07/30/2014  . 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 Lymphedema of face 06/27/2014  . Dysphagia, oropharyngeal 06/27/2014  . Neck stiffness 06/27/2014  . Non-healing surgical wound 05/23/2014  . Throat pain in adult 02/19/2014  . S/P gastrostomy ([place]) 11/26/2013  . Dehydration 10/23/2013  . Anemia in neoplastic disease 10/14/2013  . Leukopenia due to antineoplastic chemotherapy 10/14/2013  . Prerenal renal failure 10/14/2013  . Chest wall pain 10/14/2013  . Nausea & vomiting 09/12/2013  . Protein-calorie malnutrition, severe ([place]) 08/27/2013  . Chronic periodontitis 08/01/2013  . Tonsillar cancer ([place]) 07/16/2013    Roc Streett 06/25/2015, 8:49 PM  [place], [place], [address] Phone: [phone]   Fax:  [place], PT 06/25/2015 8:49 PM   PHYSICAL THERAPY DISCHARGE SUMMARY  Visits from Start  of Care:  1  Current functional level related to goals / functional outcomes: Goals were not met, as patient did not return after initial evaluation for treatment.   Remaining deficits: Unknown: patient did not return after evaluation.   Education / Equipment: Treatment options. Plan: Patient agrees to discharge.  Patient goals were not met. Patient is being discharged due to not returning since the last visit.  ?????    Serafina Royals, PT 06/25/2015 8:49 PM

## 2014-07-25 ENCOUNTER — Encounter: Payer: Self-pay | Admitting: Internal Medicine

## 2014-07-25 ENCOUNTER — Ambulatory Visit: Payer: Medicaid Other | Attending: Internal Medicine | Admitting: Internal Medicine

## 2014-07-25 VITALS — BP 138/89 | HR 86 | Temp 98.0°F | Resp 16 | Wt 219.0 lb

## 2014-07-25 DIAGNOSIS — M436 Torticollis: Secondary | ICD-10-CM | POA: Diagnosis not present

## 2014-07-25 DIAGNOSIS — R0981 Nasal congestion: Secondary | ICD-10-CM | POA: Insufficient documentation

## 2014-07-25 DIAGNOSIS — C099 Malignant neoplasm of tonsil, unspecified: Secondary | ICD-10-CM | POA: Insufficient documentation

## 2014-07-25 MED ORDER — FLUTICASONE PROPIONATE 50 MCG/ACT NA SUSP: 2.0000 | Freq: Every day | NASAL | Status: DC

## 2014-07-25 MED ORDER — NAPROXEN 500 MG PO TABS
500.0000 mg | ORAL_TABLET | Freq: Two times a day (BID) | ORAL | Status: DC
Start: 2014-07-25 — End: 2014-07-29

## 2014-07-25 NOTE — Progress Notes (Signed)
MRN: [id number] Name: Mason [name]  Sex: male Age: 49 y.o. DOB: May 25, 1965  Allergies: Pollen extract  Chief Complaint  Patient presents with  . Follow-up    HPI: Patient is 49 y.o. male who has history of throat cancer currently being followed up by oncologist had radiation therapy and chemotherapy done in the past, today he is requesting refill on pain medication, EMR reviewed patient has been getting narcotic medication from his oncologistand also has been recently undergoing physical therapy, recently also has been having stuffy nose runny nose postnasal drip denies any fever chills has minimal cough denies any chest pain or shortness of breath.  Past Medical History  Diagnosis Date  . 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 Knee pain, chronic   . Anxiety     mild new dx  . Arthritis     knees,hips  . Concussion     Hx: in high school x 2  . Complication of anesthesia     Pt stated " my oxygen level was slow in rising."  . Hypertension   . PEG (percutaneous endoscopic gastrostomy) status   . Malnutrition   . Bilateral edema of lower extremity   . Abnormal liver function test   . Anemia   . Constipation   . Hypokalemia   . Fever   . Hypoglycemia   . Renal failure, acute   . Severe nausea and vomiting   . Hyperactive gag reflex   . Status post chemotherapy     Only received 2 doses due to uncontrolled nausea and acute renal failure  . S/P radiation therapy 08/19/2013-10/15/2013    Right Tonstil and bilateral neck / 70 Gy in 35 fractions to gross disease, 63 Gy in 35 fractions to high risk nodal echelons, and 56 Gy in 35 fractions to intermediate risk nodal echelons  . Non-healing surgical wound 05/23/2014    Past Surgical History  Procedure Laterality Date  . Multiple extractions with alveoloplasty N/A 08/01/2013    Procedure: Extraction of tooth #'s 1,15,17,31, 32 with alveoloplasty, mandibular left torus reduction, and gross debridement of  remaining teeth.;  Surgeon: [name], DDS;  Location: [facility];  Service: Oral Surgery;  Laterality: N/A;  . Laparoscopic gastrostomy N/A 08/15/2013    Procedure: LAPAROSCOPIC GASTROSTOMY TUBE PLACEMENT;  Surgeon: [name], MD;  Location: [facility];  Service: General;  Laterality: N/A;  . Portacath placement Left 08/15/2013    Procedure: INSERTION PORT-A-CATH;  Surgeon: [name], MD;  Location: [facility];  Service: General;  Laterality: Left;  . Lymph node biopsy  03/20/14    right neck  . Port-a-cath removal N/A 05/13/2014    Procedure: REMOVAL of PORT-A-CATH;  Surgeon: [name], MD;  Location: [facility];  Service: General;  Laterality: N/A;      Medication List       This list is accurate as of: 07/25/14  9:49 AM.  Always use your most recent med list.               fluticasone 50 MCG/ACT nasal spray  Commonly known as:  FLONASE  Place 2 sprays into both nostrils daily.     ibuprofen 200 MG tablet  Commonly known as:  ADVIL,MOTRIN  Take 200 mg by mouth every 6 (six) hours as needed.     naproxen 500 MG tablet  Commonly known as:  NAPROSYN  Take 1 tablet (500 mg total) by mouth 2 (two) times daily with  a meal.     naproxen sodium 220 MG tablet  Commonly known as:  ANAPROX  Take 220 mg by mouth 2 (two) times daily as needed.     oxyCODONE-acetaminophen 5-325 MG per tablet  Commonly known as:  PERCOCET/ROXICET  Take 1 tablet by mouth every 8 (eight) hours as needed for severe pain.        Meds ordered this encounter  Medications  . naproxen (NAPROSYN) 500 MG tablet    Sig: Take 1 tablet (500 mg total) by mouth 2 (two) times daily with a meal.    Dispense:  30 tablet    Refill:  2  . fluticasone (FLONASE) 50 MCG/ACT nasal spray    Sig: Place 2 sprays into both nostrils daily.    Dispense:  16 g    Refill:  2    Immunization History  Administered Date(s) Administered  . Influenza,inj,Quad PF,36+ Mos 12/31/2013    Family History  Problem Relation Age  of Onset  . CVA    . Diabetes    . Heart attack    . Arthritis Father   . Arthritis Mother     History  Substance Use Topics  . Smoking status: Former Smoker -- 1.00 packs/day for 20 years    Types: Cigarettes    Quit date: 07/16/2003  . Smokeless tobacco: Never Used  . Alcohol Use: No     Comment: none years ago    Review of Systems   As noted in HPI  Filed Vitals:   07/25/14 0930  BP: 138/89  Pulse: 86  Temp: 98 F (36.7 C)  Resp: 16    Physical Exam  Physical Exam  Constitutional: No distress.  HENT:  Minimal nasal congestion no sinus tenderness, has lymphedema on the right side of neck  Cardiovascular: Normal rate and regular rhythm.   Pulmonary/Chest: Breath sounds normal. No respiratory distress. He has no wheezes. He has no rales.    CBC    Component Value Date/Time   WBC 5.0 05/22/2014 0846   WBC 3.6* 05/12/2014 1438   RBC 3.99* 05/22/2014 0846   RBC 3.91* 05/12/2014 1438   HGB 10.8* 05/22/2014 0846   HGB 10.8* 05/12/2014 1438   HCT 34.5* 05/22/2014 0846   HCT 33.8* 05/12/2014 1438   PLT 219 05/22/2014 0846   PLT 206 05/12/2014 1438   MCV 86.5 05/22/2014 0846   MCV 86.4 05/12/2014 1438   LYMPHSABS 0.5* 05/22/2014 0846   LYMPHSABS 0.3* 09/30/2013 0522   MONOABS 0.5 05/22/2014 0846   MONOABS 0.2 09/30/2013 0522   EOSABS 0.1 05/22/2014 0846   EOSABS 0.0 09/30/2013 0522   BASOSABS 0.0 05/22/2014 0846   BASOSABS 0.0 09/30/2013 0522    CMP     Component Value Date/Time   NA 142 05/22/2014 0846   NA 140 05/12/2014 1438   K 4.2 05/22/2014 0846   K 4.2 05/12/2014 1438   CL 104 05/12/2014 1438   CO2 28 05/22/2014 0846   CO2 30 05/12/2014 1438   GLUCOSE 81 05/22/2014 0846   GLUCOSE 84 05/12/2014 1438   BUN 8.7 05/22/2014 0846   BUN 8 05/12/2014 1438   CREATININE 1.4* 05/22/2014 0846   CREATININE 1.31 05/12/2014 1438   CALCIUM 8.8 05/22/2014 0846   CALCIUM 9.3 05/12/2014 1438   PROT 6.9 05/22/2014 0846   PROT 7.5 09/30/2013 0522    ALBUMIN 3.8 05/22/2014 0846   ALBUMIN 3.4* 09/30/2013 0522   AST 15 05/22/2014 0846   AST 24 09/30/2013  0522   ALT 9 05/22/2014 0846   ALT 43 09/30/2013 0522   ALKPHOS 61 05/22/2014 0846   ALKPHOS 101 09/30/2013 0522   BILITOT 0.36 05/22/2014 0846   BILITOT 0.3 09/30/2013 0522   GFRNONAA 64* 05/12/2014 1438   GFRAA 74* 05/12/2014 1438    No results found for: CHOL  Lab Results  Component Value Date/Time   HGBA1C 5.7 08/09/2013 03:11 PM    Lab Results  Component Value Date/Time   AST 15 05/22/2014 08:46 AM   AST 24 09/30/2013 05:22 AM    Assessment and Plan  Nasal congestion - Plan: patient is afebrile vitals are stable, likely patient has allergic rhinitis, prescribed fluticasone (FLONASE) 50 MCG/ACT nasal spray  Tonsillar cancer Currently being followed up with his oncologist  Neck stiffness - Plan: Patient is undergoing physical therapy, I have given him refill on  naproxen (NAPROSYN) 500 MG tablet, patient is advised to follow up with the  provider who has been prescribing him narcotics medication.    Return in about 6 months (around 01/24/2015), or if symptoms worsen or fail to improve.   This note has been created with Surveyor, quantity. Any transcriptional errors are unintentional.    Lorayne Marek, MD

## 2014-07-25 NOTE — Progress Notes (Signed)
Patient here for follow up and routine check up Requesting refills on his medications

## 2014-07-29 ENCOUNTER — Ambulatory Visit (HOSPITAL_BASED_OUTPATIENT_CLINIC_OR_DEPARTMENT_OTHER): Payer: Medicaid Other | Admitting: Hematology and Oncology

## 2014-07-29 ENCOUNTER — Telehealth: Payer: Self-pay | Admitting: *Deleted

## 2014-07-29 ENCOUNTER — Telehealth: Payer: Self-pay | Admitting: Hematology and Oncology

## 2014-07-29 VITALS — BP 135/75 | HR 81 | Temp 98.2°F | Resp 18 | Ht 79.0 in | Wt 221.1 lb

## 2014-07-29 DIAGNOSIS — I890 Lymphedema, not elsewhere classified: Secondary | ICD-10-CM

## 2014-07-29 DIAGNOSIS — R1312 Dysphagia, oropharyngeal phase: Secondary | ICD-10-CM | POA: Diagnosis not present

## 2014-07-29 DIAGNOSIS — T8189XD Other complications of procedures, not elsewhere classified, subsequent encounter: Secondary | ICD-10-CM

## 2014-07-29 DIAGNOSIS — T451X5A Adverse effect of antineoplastic and immunosuppressive drugs, initial encounter: Secondary | ICD-10-CM

## 2014-07-29 DIAGNOSIS — M436 Torticollis: Secondary | ICD-10-CM | POA: Diagnosis not present

## 2014-07-29 DIAGNOSIS — G620 Drug-induced polyneuropathy: Secondary | ICD-10-CM

## 2014-07-29 DIAGNOSIS — C099 Malignant neoplasm of tonsil, unspecified: Secondary | ICD-10-CM

## 2014-07-29 DIAGNOSIS — R4702 Dysphasia: Secondary | ICD-10-CM

## 2014-07-29 MED ORDER — OXYCODONE-ACETAMINOPHEN 5-325 MG PO TABS: 1.0000 | ORAL_TABLET | Freq: Three times a day (TID) | ORAL | Status: DC | PRN

## 2014-07-29 MED ORDER — GABAPENTIN 300 MG PO CAPS: 300.0000 mg | ORAL_CAPSULE | Freq: Three times a day (TID) | ORAL | Status: DC

## 2014-07-29 NOTE — Telephone Encounter (Signed)
Patient called to confirm appointment time.  Informed him appointment is today at 1:00 pm.

## 2014-07-29 NOTE — Telephone Encounter (Signed)
gave and pritned appt sched and avs fo rpt for May  °

## 2014-07-30 ENCOUNTER — Encounter: Payer: Self-pay | Admitting: Hematology and Oncology

## 2014-07-30 DIAGNOSIS — G620 Drug-induced polyneuropathy: Secondary | ICD-10-CM | POA: Insufficient documentation

## 2014-07-30 DIAGNOSIS — T451X5A Adverse effect of antineoplastic and immunosuppressive drugs, initial encounter: Secondary | ICD-10-CM

## 2014-07-30 DIAGNOSIS — R4702 Dysphasia: Secondary | ICD-10-CM | POA: Insufficient documentation

## 2014-07-30 NOTE — Assessment & Plan Note (Signed)
He has significant neuropathy from prior chemotherapy and is very reluctant to try gabapentin. After extensive discussion, he is willing to start gabapentin 300 mg 3 times a day.

## 2014-07-30 NOTE — Assessment & Plan Note (Signed)
Repeat CT scan of the neck just show postsurgical changes. The neck wound had healed but he is having significant pain due to skin tightness and lymphedema. We have a long discussion about plan of care. Patient is not interested to start neuropathic pain medication such as gabapentin. After a very prolonged discussion in the presence of his significant other, he is willing to try gabapentin. I prescribed pain medicine for him for another month and continue supportive care.

## 2014-07-30 NOTE — Assessment & Plan Note (Signed)
He has significant lymphedema and limited neck movement related to pain. I will refer him for physical therapy for exercise.

## 2014-07-30 NOTE — Assessment & Plan Note (Signed)
He has significant oropharyngeal dysphagia from prior treatment. He will continue swallow exercises. He denies recent choking sensation.

## 2014-07-30 NOTE — Assessment & Plan Note (Signed)
He has significant dysphasia and slurred speech, likely related to posttreatment side effects. I recommend he follows closely with the speech and language therapist.

## 2014-07-30 NOTE — Assessment & Plan Note (Signed)
He has significant neck stiffness and limitation of movement due to lymphedema and pain. Again, I recommend physical therapy. The patient requested pain medication prescription. I gave him prescription supply for one month. We discussed narcotic refill policy.

## 2014-07-30 NOTE — Progress Notes (Signed)
[facility] OFFICE PROGRESS NOTE  Patient Care Team: Lorayne Marek, MD as PCP - General (Internal Medicine) [name] Patient (General Practice) [name], RN as Registered Nurse (Oncology) [name], MD as Referring Physician (Otolaryngology) [name], MD as Consulting Physician (Hematology and Oncology)  SUMMARY OF ONCOLOGIC HISTORY: Oncology History   Tonsillar cancer, HPV positive   Primary site: Pharynx - Oropharynx (Right)   Staging method: AJCC 7th Edition   Clinical: Stage IVA (T2, N2b, M0) signed by [name], MD on 08/19/2013  1:24 PM   Summary: Stage IVA (T2, N2b, M0)       Tonsillar cancer   07/09/2013 Procedure 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 FNA of right level III lymph node was inconclusive for cancer   07/25/2013 Imaging PET scan showed locally advanced disease with abnormal lymphadenopathy in the right axilla   08/06/2013 Procedure CT-guided biopsy of the lymphadenopathy was negative for malignancy   08/15/2013 Surgery Patient has placement of port and feeding tube   08/19/2013 - 09/10/2013 Chemotherapy Patient received chemotherapy with cisplatin. The patient only received 2 doses due to uncontrolled nausea and acute renal failure.   08/19/2013 - 10/15/2013 Radiation Therapy Patient received radiation treatment   08/27/2013 - 08/30/2013 Hospital Admission The patient was admitted to the hospital for uncontrolled nausea vomiting and dehydration.   02/14/2014 Imaging PET/CT scan showed complete response to treatment   03/19/2014 Surgery He had excisional lymph node biopsy from the right neck. Pathology was benign   05/13/2014 Surgery He had removal of Port-A-Cath.   05/22/2014 Imaging Repeat CT scan of the neck show no evidence of disease recurrence.    INTERVAL HISTORY: Please see below for problem oriented charting. He returns for supportive care visit. He continues to have pain in his throat,  neuropathic pain in his feet and pain at the prior feeding tube site. The patient has been attending physical therapy for lymphedema. REVIEW OF SYSTEMS:   Constitutional: Denies fevers, chills or abnormal weight loss Eyes: Denies blurriness of vision Ears, nose, mouth, throat, and face: Denies mucositis or sore throat Respiratory: Denies cough, dyspnea or wheezes Cardiovascular: Denies palpitation, chest discomfort or lower extremity swelling Gastrointestinal:  Denies nausea, heartburn or change in bowel habits Skin: Denies abnormal skin rashes Lymphatics: Denies new lymphadenopathy or easy bruising Neurological:Denies numbness, tingling or new weaknesses Behavioral/Psych: Mood is stable, no new changes  All other systems were reviewed with the patient and are negative.  I have reviewed the past medical history, past surgical history, social history and family history with the patient and they are unchanged from previous note.  ALLERGIES:  is allergic to pollen extract.  MEDICATIONS:  Current Outpatient Prescriptions  Medication Sig Dispense Refill  . fluticasone (FLONASE) 50 MCG/ACT nasal spray Place 2 sprays into both nostrils daily. 16 g 2  . naproxen sodium (ANAPROX) 220 MG tablet Take 220 mg by mouth 2 (two) times daily as needed.    Marland Kitchen oxyCODONE-acetaminophen (PERCOCET/ROXICET) 5-325 MG per tablet Take 1 tablet by mouth every 8 (eight) hours as needed for severe pain. 90 tablet 0  . gabapentin (NEURONTIN) 300 MG capsule Take 1 capsule (300 mg total) by mouth 3 (three) times daily. 90 capsule 6   No current facility-administered medications for this visit.    PHYSICAL EXAMINATION: ECOG PERFORMANCE STATUS: 1 - Symptomatic but completely ambulatory  Filed Vitals:   07/29/14 1300  BP: 135/75  Pulse: 81  Temp: 98.2 F (  36.8 C)  Resp: 18   Filed Weights   07/29/14 1300  Weight: 221 lb 1.6 oz (100.29 kg)    GENERAL:alert, no distress and comfortable SKIN: skin color,  texture, turgor are normal, no rashes or significant lesions EYES: normal, Conjunctiva are pink and non-injected, sclera clear OROPHARYNX:no exudate, no erythema and lips, buccal mucosa, and tongue normal  NECK: Significant fibrosis noted on the neck with limitation of movement. LYMPH:  no palpable lymphadenopathy in the cervical, axillary or inguinal LUNGS: clear to auscultation and percussion with normal breathing effort HEART: regular rate & rhythm and no murmurs and no lower extremity edema ABDOMEN:abdomen soft, non-tender and normal bowel sounds Musculoskeletal:no cyanosis of digits and no clubbing  NEURO: alert & oriented x 3 with slightly slurred speech, no focal motor/sensory deficits  LABORATORY DATA:  I have reviewed the data as listed    Component Value Date/Time   NA 142 05/22/2014 0846   NA 140 05/12/2014 1438   K 4.2 05/22/2014 0846   K 4.2 05/12/2014 1438   CL 104 05/12/2014 1438   CO2 28 05/22/2014 0846   CO2 30 05/12/2014 1438   GLUCOSE 81 05/22/2014 0846   GLUCOSE 84 05/12/2014 1438   BUN 8.7 05/22/2014 0846   BUN 8 05/12/2014 1438   CREATININE 1.4* 05/22/2014 0846   CREATININE 1.31 05/12/2014 1438   CALCIUM 8.8 05/22/2014 0846   CALCIUM 9.3 05/12/2014 1438   PROT 6.9 05/22/2014 0846   PROT 7.5 09/30/2013 0522   ALBUMIN 3.8 05/22/2014 0846   ALBUMIN 3.4* 09/30/2013 0522   AST 15 05/22/2014 0846   AST 24 09/30/2013 0522   ALT 9 05/22/2014 0846   ALT 43 09/30/2013 0522   ALKPHOS 61 05/22/2014 0846   ALKPHOS 101 09/30/2013 0522   BILITOT 0.36 05/22/2014 0846   BILITOT 0.3 09/30/2013 0522   GFRNONAA 64* 05/12/2014 1438   GFRAA 74* 05/12/2014 1438    No results found for: SPEP, UPEP  Lab Results  Component Value Date   WBC 5.0 05/22/2014   NEUTROABS 3.9 05/22/2014   HGB 10.8* 05/22/2014   HCT 34.5* 05/22/2014   MCV 86.5 05/22/2014   PLT 219 05/22/2014      Chemistry      Component Value Date/Time   NA 142 05/22/2014 0846   NA 140 05/12/2014  1438   K 4.2 05/22/2014 0846   K 4.2 05/12/2014 1438   CL 104 05/12/2014 1438   CO2 28 05/22/2014 0846   CO2 30 05/12/2014 1438   BUN 8.7 05/22/2014 0846   BUN 8 05/12/2014 1438   CREATININE 1.4* 05/22/2014 0846   CREATININE 1.31 05/12/2014 1438      Component Value Date/Time   CALCIUM 8.8 05/22/2014 0846   CALCIUM 9.3 05/12/2014 1438   ALKPHOS 61 05/22/2014 0846   ALKPHOS 101 09/30/2013 0522   AST 15 05/22/2014 0846   AST 24 09/30/2013 0522   ALT 9 05/22/2014 0846   ALT 43 09/30/2013 0522   BILITOT 0.36 05/22/2014 0846   BILITOT 0.3 09/30/2013 0522      ASSESSMENT & PLAN:  Tonsillar cancer Repeat CT scan of the neck just show postsurgical changes. The neck wound had healed but he is having significant pain due to skin tightness and lymphedema. We have a long discussion about plan of care. Patient is not interested to start neuropathic pain medication such as gabapentin. After a very prolonged discussion in the presence of his significant other, he is willing to try gabapentin.  I prescribed pain medicine for him for another month and continue supportive care.   Dysphagia, oropharyngeal  He has significant oropharyngeal dysphagia from prior treatment. He will continue swallow exercises. He denies recent choking sensation.   Lymphedema of face  He has significant lymphedema and limited neck movement related to pain. I will refer him for physical therapy for exercise.   Neck stiffness  He has significant neck stiffness and limitation of movement due to lymphedema and pain. Again, I recommend physical therapy. The patient requested pain medication prescription. I gave him prescription supply for one month. We discussed narcotic refill policy.   Acquired dysphasia He has significant dysphasia and slurred speech, likely related to posttreatment side effects. I recommend he follows closely with the speech and language therapist.   Neuropathy due to chemotherapeutic  drug He has significant neuropathy from prior chemotherapy and is very reluctant to try gabapentin. After extensive discussion, he is willing to start gabapentin 300 mg 3 times a day.    No orders of the defined types were placed in this encounter.   All questions were answered. The patient knows to call the clinic with any problems, questions or concerns. No barriers to learning was detected. I spent 25 minutes counseling the patient face to face. The total time spent in the appointment was 30 minutes and more than 50% was on counseling and review of test results     [facility], [place], MD 07/30/2014 9:27 AM

## 2014-08-22 ENCOUNTER — Encounter: Payer: Self-pay | Admitting: Radiation Oncology

## 2014-08-22 ENCOUNTER — Ambulatory Visit
Admission: RE | Admit: 2014-08-22 | Discharge: 2014-08-22 | Disposition: A | Discharge status: Home / Self Care | Payer: Medicaid Other | Source: Ambulatory Visit | Attending: Radiation Oncology | Admitting: Radiation Oncology

## 2014-08-22 ENCOUNTER — Ambulatory Visit (HOSPITAL_BASED_OUTPATIENT_CLINIC_OR_DEPARTMENT_OTHER)
Admission: RE | Admit: 2014-08-22 | Discharge: 2014-08-22 | Disposition: A | Discharge status: Home / Self Care | Payer: Medicaid Other | Source: Ambulatory Visit | Attending: Radiation Oncology | Admitting: Radiation Oncology

## 2014-08-22 VITALS — BP 135/89 | HR 74 | Temp 98.3°F | Resp 12 | Ht 79.0 in | Wt 225.1 lb

## 2014-08-22 DIAGNOSIS — C099 Malignant neoplasm of tonsil, unspecified: Secondary | ICD-10-CM

## 2014-08-22 LAB — TSH CHCC: TSH: 1.636 m(IU)/L (ref 0.320–4.118)

## 2014-08-22 NOTE — Progress Notes (Addendum)
Radiation Oncology         [phone] ________________________________  Name: Mason [name] MRN: [id number]  Date: 08/22/2014  DOB: 26-Sep-1965  Follow-Up Visit Note  CC: Lorayne Marek, MD  [name], MD  Diagnosis and Prior Radiotherapy:   [id number] Right Tonsil Squamous cell carcinoma, Stage IVA  Indication for treatment: Curative with chemotherapy  Radiation treatment dates: 08/19/2013-10/15/2013  Site/dose: Right Tonsil and bilateral neck / 70 Gy in 35 fractions to gross disease, 63 Gy in 35 fractions to high risk nodal echelons, and 56 Gy in 35 fractions to intermediate risk nodal echelons   Narrative:  The patient returns today for routine follow-up. On 03/19/14 he has a lymph node biopsy on the right neck which was benign.  CT of neck 05/22/14 showed no evidence of disease.  Pain Status: Has pain in his right upper neck area and upper abdomen where his feeding tube was. It was removed 3 months ago. He reports pain is worse at night. Takes 1 percocet at night as needed.  Weight changes, if any: his weight has been stable for the past 2 months.  Nutritional Status  a) intake: has to be careful with meats. Still can't eat steak.  b) using a feeding tube?: No (removed) c) weight changes, if any: stable for past 2 months  Swallowing Status: food gets "hung up here and there." He has trouble with steak but can eat other foods without any issues.  Dental (if applicable): When was last visit with dentistry? ~3 months. Using fluoride trays daily? yes When was last ENT visit? 06/10/14 with Dr. Simeon Craft, missed appointment in April When is next ENT visit? Has not been called to reschedule yet.  Other notable issues, if any: He reports a dry mouth but reports he is able to accumulate enough saliva to spit. His voice is muffled. He reports his energy comes and goes. 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  Pt states he has quit smoking around his last visit.   ALLERGIES:  is allergic to  pollen extract.  Meds: Current Outpatient Prescriptions  Medication Sig Dispense Refill  . fluticasone (FLONASE) 50 MCG/ACT nasal spray Place 2 sprays into both nostrils daily. 16 g 2  . gabapentin (NEURONTIN) 300 MG capsule Take 1 capsule (300 mg total) by mouth 3 (three) times daily. 90 capsule 6  . naproxen sodium (ANAPROX) 220 MG tablet Take 220 mg by mouth 2 (two) times daily as needed.    Marland Kitchen oxyCODONE-acetaminophen (PERCOCET/ROXICET) 5-325 MG per tablet Take 1 tablet by mouth every 8 (eight) hours as needed for severe pain. 90 tablet 0   No current facility-administered medications for this encounter.    Physical Findings: The patient is in no acute distress. Patient is alert and oriented.  height is 6\' 7"  (2.007 m) and weight is 225 lb 1.6 oz (102.105 kg). His oral temperature is 98.3 F (36.8 C). His blood pressure is 135/89 and his pulse is 74. His respiration is 12 and oxygen saturation is 100%. .  Oropharyngeal mucosa is intact with no thrush or lesions.  No palpable cervical or supraclavicular lymphadenopathy. Skin intact and smooth over neck. Right neck is firm consistent with radiation and post-surgical changes. Healed surgical scar on right neck. Heart has regular rate and rhythm.  Chest is clear to auscultation bilaterally. Abdomen is tender at former PEG site.   Lab Findings: Lab Results  Component Value Date   WBC 5.0 05/22/2014   HGB 10.8* 05/22/2014  HCT 34.5* 05/22/2014   MCV 86.5 05/22/2014   PLT 219 05/22/2014    Lab Results  Component Value Date   TSH 1.304 07/24/2013    Radiographic Findings: No results found.  Impression/Plan:    1) Head and Neck Cancer Status:   NED.  2) Nutritional Status: Intake tolerated well. - PEG tube:removed; some tenderness at site; knows to call surgeon if this is bothering him significantly for possible followup. Abdomen is  slightly firm and tender in this area, no erythema, or guarding or distention.  3) Risk  Factors: The patient has been educated about risk factors including alcohol and tobacco abuse; they understand that avoidance of alcohol and tobacco is important to prevent recurrences as well as other cancers. Not smoking.  4) Swallowing: no issues (except for eating steak)  5) Dental: Encouraged to continue regular followup with dentistry, and dental hygiene including fluoride rinses.    6) Energy: will screen TSH q 6-65mo; TSH level results are pending.  If a Rx is needed, then it will be filled at the [facility] on [address].  7) Social: Pt is back to work part time.  8) Other: Pt should follow-up with Dr. Simeon Craft in 3 months. Gayleen Orem, RN, our Head and Neck Oncology Navigator will arrange  9) Gave a card to the pt to follow-up with me in 7 months. The patient was encouraged to call with any issues or questions before then.  This document serves as a record of services personally performed by Eppie Gibson, MD. It was created on her behalf by Darcus Austin, a trained medical scribe. The creation of this record is based on the scribe's personal observations and the provider's statements to them. This document has been checked and approved by the attending provider.      _____________________________________   Eppie Gibson, MD

## 2014-08-22 NOTE — Progress Notes (Signed)
Pain Status: has pain in his right upper neck area and upper abdomen where his feeding tube was.  It was removed 3 months ago.  He reports pain is worse at night.  Takes 1 percocet at night as needed.  Weight changes, if any: his weight has been stable for the past 2 months.  Nutritional Status a) intake: has to be careful with meats. Still can't eat steak. b) using a feeding tube?: no c) weight changes, if any: stable for past 2 months  Swallowing Status: food gets "hung up here and there."  He has trouble with steak but can eat fried chicken without any issues.  Dental (if applicable): When was last visit with dentistry no  Using fluoride trays daily? yes   When was last ENT visit? 06/10/14 with Dr. Simeon Craft, missed appointment in April  When is next ENT visit? Has not been called to reschedule yet.  Other notable issues, if any: He reports a dry mouth but reports he is able to accumulate enough saliva to spit.  His voice is muffled. He reports his energy comes and goes.  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  BP 135/89 mmHg  Pulse 74  Temp(Src) 98.3 F (36.8 C) (Oral)  Resp 12  Ht 6\' 7"  (2.007 m)  Wt 225 lb 1.6 oz (102.105 kg)  BMI 25.35 kg/m2  SpO2 100%   Wt Readings from Last 3 Encounters:  07/29/14 221 lb 1.6 oz (100.29 kg)  07/25/14 219 lb (99.338 kg)  06/27/14 224 lb 3.2 oz (101.696 kg)

## 2014-08-25 ENCOUNTER — Telehealth: Payer: Self-pay | Admitting: *Deleted

## 2014-08-25 NOTE — Telephone Encounter (Addendum)
Entry error

## 2014-08-25 NOTE — Telephone Encounter (Signed)
Per Dr. Isidore Moos, called [facility], spoke with Janett Billow, requested routine follow-up appt with Dr. Simeon Craft in 3 months.  She verbalized understanding.  Gayleen Orem, RN, BSN, [facility] [place] [phone]

## 2014-08-28 ENCOUNTER — Telehealth: Payer: Self-pay | Admitting: Hematology and Oncology

## 2014-08-28 ENCOUNTER — Encounter: Payer: Self-pay | Admitting: Hematology and Oncology

## 2014-08-28 ENCOUNTER — Ambulatory Visit (HOSPITAL_BASED_OUTPATIENT_CLINIC_OR_DEPARTMENT_OTHER): Payer: Medicaid Other | Admitting: Hematology and Oncology

## 2014-08-28 VITALS — BP 141/83 | HR 65 | Temp 97.9°F | Resp 16 | Ht 79.0 in | Wt 222.4 lb

## 2014-08-28 DIAGNOSIS — M436 Torticollis: Secondary | ICD-10-CM

## 2014-08-28 DIAGNOSIS — C099 Malignant neoplasm of tonsil, unspecified: Secondary | ICD-10-CM | POA: Diagnosis not present

## 2014-08-28 DIAGNOSIS — R4702 Dysphasia: Secondary | ICD-10-CM

## 2014-08-28 DIAGNOSIS — I890 Lymphedema, not elsewhere classified: Secondary | ICD-10-CM | POA: Diagnosis not present

## 2014-08-28 DIAGNOSIS — G620 Drug-induced polyneuropathy: Secondary | ICD-10-CM

## 2014-08-28 DIAGNOSIS — T8189XD Other complications of procedures, not elsewhere classified, subsequent encounter: Secondary | ICD-10-CM

## 2014-08-28 DIAGNOSIS — T451X5A Adverse effect of antineoplastic and immunosuppressive drugs, initial encounter: Secondary | ICD-10-CM

## 2014-08-28 DIAGNOSIS — R1312 Dysphagia, oropharyngeal phase: Secondary | ICD-10-CM | POA: Diagnosis not present

## 2014-08-28 MED ORDER — OXYCODONE-ACETAMINOPHEN 5-325 MG PO TABS: 1.0000 | ORAL_TABLET | Freq: Three times a day (TID) | ORAL | Status: DC | PRN

## 2014-08-28 NOTE — Telephone Encounter (Signed)
Gave and printed appt sched and avs for pt for JUNE °

## 2014-08-30 NOTE — Assessment & Plan Note (Signed)
He has significant lymphedema and limited neck movement related to pain.This has improved since last visit I recommend he continues physical therapy for exercise.

## 2014-08-30 NOTE — Assessment & Plan Note (Signed)
He has significant oropharyngeal dysphagia from prior treatment. He will continue swallow exercises. He denies recent choking sensation.

## 2014-08-30 NOTE — Assessment & Plan Note (Signed)
This has improved with physical therapy I recommend he continues the same

## 2014-08-30 NOTE — Assessment & Plan Note (Signed)
Repeat CT scan of the neck just show postsurgical changes. The neck wound had healed with significant scarring I plan to repeat another CT neck and chest next month to exclude residual disease

## 2014-08-30 NOTE — Assessment & Plan Note (Signed)
He has significant dysphasia and slurred speech, likely related to posttreatment side effects. This has improved slightly since his last visit I recommend he follows closely with the speech and language therapist.

## 2014-08-30 NOTE — Assessment & Plan Note (Signed)
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 After extensive discussion, he was willing to start gabapentin 300 mg. Since I saw him last month, he finds that very helpful I recommend he continues the same and I will start oxycodone taper. He only takes oxycodone twice a week now

## 2014-08-30 NOTE — Progress Notes (Signed)
[facility] OFFICE PROGRESS NOTE  Patient Care Team: Lorayne Marek, MD as PCP - General (Internal Medicine) [name] Patient (General Practice) [name], RN as Registered Nurse (Oncology) [name], MD as Referring Physician (Otolaryngology) [name], MD as Consulting Physician (Hematology and Oncology)  SUMMARY OF ONCOLOGIC HISTORY: Oncology History   Tonsillar cancer, HPV positive   Primary site: Pharynx - Oropharynx (Right)   Staging method: AJCC 7th Edition   Clinical: Stage IVA (T2, N2b, M0) signed by [name], MD on 08/19/2013  1:24 PM   Summary: Stage IVA (T2, N2b, M0)       Tonsillar cancer   07/09/2013 Procedure 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 FNA of right level III lymph node was inconclusive for cancer   07/25/2013 Imaging PET scan showed locally advanced disease with abnormal lymphadenopathy in the right axilla   08/06/2013 Procedure CT-guided biopsy of the lymphadenopathy was negative for malignancy   08/15/2013 Surgery Patient has placement of port and feeding tube   08/19/2013 - 09/10/2013 Chemotherapy Patient received chemotherapy with cisplatin. The patient only received 2 doses due to uncontrolled nausea and acute renal failure.   08/19/2013 - 10/15/2013 Radiation Therapy Patient received radiation treatment   08/27/2013 - 08/30/2013 Hospital Admission The patient was admitted to the hospital for uncontrolled nausea vomiting and dehydration.   02/14/2014 Imaging PET/CT scan showed complete response to treatment   03/19/2014 Surgery He had excisional lymph node biopsy from the right neck. Pathology was benign   05/13/2014 Surgery He had removal of Port-A-Cath.   05/22/2014 Imaging Repeat CT scan of the neck show no evidence of disease recurrence.    INTERVAL HISTORY: Please see below for problem oriented charting. He returns for further follow-up. He feels better; improved swallowing, less  dysphagia, less dysphasia, less lymphedema, improved neck mobility and improved pain control  REVIEW OF SYSTEMS:   Constitutional: Denies fevers, chills or abnormal weight loss Eyes: Denies blurriness of vision Ears, nose, mouth, throat, and face: Denies mucositis or sore throat Respiratory: Denies cough, dyspnea or wheezes Cardiovascular: Denies palpitation, chest discomfort or lower extremity swelling Gastrointestinal:  Denies nausea, heartburn or change in bowel habits Skin: Denies abnormal skin rashes Lymphatics: Denies new lymphadenopathy or easy bruising Neurological:Denies numbness, tingling or new weaknesses Behavioral/Psych: Mood is stable, no new changes  All other systems were reviewed with the patient and are negative.  I have reviewed the past medical history, past surgical history, social history and family history with the patient and they are unchanged from previous note.  ALLERGIES:  is allergic to pollen extract.  MEDICATIONS:  Current Outpatient Prescriptions  Medication Sig Dispense Refill  . fluticasone (FLONASE) 50 MCG/ACT nasal spray Place 2 sprays into both nostrils daily. 16 g 2  . gabapentin (NEURONTIN) 300 MG capsule Take 1 capsule (300 mg total) by mouth 3 (three) times daily. 90 capsule 6  . naproxen sodium (ANAPROX) 220 MG tablet Take 220 mg by mouth 2 (two) times daily as needed.    Marland Kitchen oxyCODONE-acetaminophen (PERCOCET/ROXICET) 5-325 MG per tablet Take 1 tablet by mouth every 8 (eight) hours as needed for severe pain. 60 tablet 0   No current facility-administered medications for this visit.    PHYSICAL EXAMINATION: ECOG PERFORMANCE STATUS: 0 - Asymptomatic  Filed Vitals:   08/28/14 1051  BP: 141/83  Pulse: 65  Temp: 97.9 F (36.6 C)  Resp: 16   Filed Weights   08/28/14 1051  Weight:  222 lb 6.4 oz (100.88 kg)    GENERAL:alert, no distress and comfortable SKIN: skin color, texture, turgor are normal, no rashes or significant lesions EYES:  normal, Conjunctiva are pink and non-injected, sclera clear OROPHARYNX:no exudate, no erythema and lips, buccal mucosa, and tongue normal  NECK: mild lymphedema, significant scarring on the right side of neck LYMPH:  no palpable lymphadenopathy in the cervical, axillary or inguinal LUNGS: clear to auscultation and percussion with normal breathing effort HEART: regular rate & rhythm and no murmurs and no lower extremity edema ABDOMEN:abdomen soft, non-tender and normal bowel sounds Musculoskeletal:no cyanosis of digits and no clubbing  NEURO: alert & oriented x 3 with mild slurred speech, no focal motor/sensory deficits  LABORATORY DATA:  I have reviewed the data as listed    Component Value Date/Time   NA 142 05/22/2014 0846   NA 140 05/12/2014 1438   K 4.2 05/22/2014 0846   K 4.2 05/12/2014 1438   CL 104 05/12/2014 1438   CO2 28 05/22/2014 0846   CO2 30 05/12/2014 1438   GLUCOSE 81 05/22/2014 0846   GLUCOSE 84 05/12/2014 1438   BUN 8.7 05/22/2014 0846   BUN 8 05/12/2014 1438   CREATININE 1.4* 05/22/2014 0846   CREATININE 1.31 05/12/2014 1438   CALCIUM 8.8 05/22/2014 0846   CALCIUM 9.3 05/12/2014 1438   PROT 6.9 05/22/2014 0846   PROT 7.5 09/30/2013 0522   ALBUMIN 3.8 05/22/2014 0846   ALBUMIN 3.4* 09/30/2013 0522   AST 15 05/22/2014 0846   AST 24 09/30/2013 0522   ALT 9 05/22/2014 0846   ALT 43 09/30/2013 0522   ALKPHOS 61 05/22/2014 0846   ALKPHOS 101 09/30/2013 0522   BILITOT 0.36 05/22/2014 0846   BILITOT 0.3 09/30/2013 0522   GFRNONAA 64* 05/12/2014 1438   GFRAA 74* 05/12/2014 1438    No results found for: SPEP, UPEP  Lab Results  Component Value Date   WBC 5.0 05/22/2014   NEUTROABS 3.9 05/22/2014   HGB 10.8* 05/22/2014   HCT 34.5* 05/22/2014   MCV 86.5 05/22/2014   PLT 219 05/22/2014      Chemistry      Component Value Date/Time   NA 142 05/22/2014 0846   NA 140 05/12/2014 1438   K 4.2 05/22/2014 0846   K 4.2 05/12/2014 1438   CL 104 05/12/2014  1438   CO2 28 05/22/2014 0846   CO2 30 05/12/2014 1438   BUN 8.7 05/22/2014 0846   BUN 8 05/12/2014 1438   CREATININE 1.4* 05/22/2014 0846   CREATININE 1.31 05/12/2014 1438      Component Value Date/Time   CALCIUM 8.8 05/22/2014 0846   CALCIUM 9.3 05/12/2014 1438   ALKPHOS 61 05/22/2014 0846   ALKPHOS 101 09/30/2013 0522   AST 15 05/22/2014 0846   AST 24 09/30/2013 0522   ALT 9 05/22/2014 0846   ALT 43 09/30/2013 0522   BILITOT 0.36 05/22/2014 0846   BILITOT 0.3 09/30/2013 0522      ASSESSMENT & PLAN:  Tonsillar cancer Repeat CT scan of the neck just show postsurgical changes. The neck wound had healed with significant scarring I plan to repeat another CT neck and chest next month to exclude residual disease   Acquired dysphasia He has significant dysphasia and slurred speech, likely related to posttreatment side effects. This has improved slightly since his last visit I recommend he follows closely with the speech and language therapist.   Dysphagia, oropharyngeal  He has significant oropharyngeal dysphagia from prior  treatment. He will continue swallow exercises. He denies recent choking sensation.   Lymphedema of face  He has significant lymphedema and limited neck movement related to pain.This has improved since last visit I recommend he continues physical therapy for exercise.     Neck stiffness This has improved with physical therapy I recommend he continues the same   Neuropathy due to chemotherapeutic drug He has significant neuropathy from prior chemotherapy and was very reluctant to try gabapentin. After extensive discussion, he was willing to start gabapentin 300 mg. Since I saw him last month, he finds that very helpful I recommend he continues the same and I will start oxycodone taper. He only takes oxycodone twice a week now      Orders Placed This Encounter  Procedures  . CT Soft Tissue Neck W Contrast    Standing Status: Future      Number of Occurrences:      Standing Expiration Date: 11/28/2015    Order Specific Question:  Reason for Exam (SYMPTOM  OR DIAGNOSIS REQUIRED)    Answer:  staging tonsil ca, exclude recurrence    Order Specific Question:  Preferred imaging location?    Answer:  [facility]  . CT Chest W Contrast    Standing Status: Future     Number of Occurrences:      Standing Expiration Date: 11/28/2015    Order Specific Question:  Reason for Exam (SYMPTOM  OR DIAGNOSIS REQUIRED)    Answer:  staging tonsil ca, exclude recurrence    Order Specific Question:  Preferred imaging location?    Answer:  [facility]  . Comprehensive metabolic panel    Standing Status: Future     Number of Occurrences:      Standing Expiration Date: 11/28/2015  . CBC with Differential/Platelet    Standing Status: Future     Number of Occurrences:      Standing Expiration Date: 11/28/2015   All questions were answered. The patient knows to call the clinic with any problems, questions or concerns. No barriers to learning was detected. I spent 25 minutes counseling the patient face to face. The total time spent in the appointment was 30 minutes and more than 50% was on counseling and review of test results     [facility], [place], MD 08/30/2014 8:09 AM

## 2014-09-25 ENCOUNTER — Telehealth: Payer: Self-pay | Admitting: Hematology and Oncology

## 2014-09-25 ENCOUNTER — Other Ambulatory Visit: Payer: Self-pay | Admitting: *Deleted

## 2014-09-25 ENCOUNTER — Telehealth: Payer: Self-pay | Admitting: *Deleted

## 2014-09-25 NOTE — Telephone Encounter (Signed)
Oncology Nurse Navigator Documentation  Oncology Nurse Navigator Flowsheets 09/25/2014  Navigator Encounter Type Telephone  Patient agrees to change appt with [facility] from 10/01/14 1130 to 09/30/14 1045.  Time Spent with Patient 15

## 2014-09-25 NOTE — Telephone Encounter (Signed)
Confirmed appointment moved from 06/15 to 06/14

## 2014-09-29 ENCOUNTER — Other Ambulatory Visit: Payer: Self-pay

## 2014-09-29 ENCOUNTER — Ambulatory Visit (HOSPITAL_COMMUNITY): Payer: Medicaid Other

## 2014-09-29 ENCOUNTER — Telehealth: Payer: Self-pay | Admitting: Radiology

## 2014-09-29 ENCOUNTER — Telehealth: Payer: Self-pay | Admitting: *Deleted

## 2014-09-29 ENCOUNTER — Telehealth: Payer: Self-pay | Admitting: Hematology and Oncology

## 2014-09-29 NOTE — Telephone Encounter (Signed)
Appointments adjusted per pof and per pof patient will be made aware

## 2014-09-29 NOTE — Telephone Encounter (Signed)
Informed pt of CT scan canceled tomorrow d/t insurance will not approve it.   Asked him to come in at 10:15 am for lab prior to MD visit tomorrow.  He verbalized understanding. POF sent to add lab.

## 2014-09-30 ENCOUNTER — Encounter: Payer: Self-pay | Admitting: Hematology and Oncology

## 2014-09-30 ENCOUNTER — Ambulatory Visit (HOSPITAL_COMMUNITY): Admission: RE | Admit: 2014-09-30 | Payer: Medicaid Other | Source: Ambulatory Visit

## 2014-09-30 ENCOUNTER — Telehealth: Payer: Self-pay | Admitting: Hematology and Oncology

## 2014-09-30 ENCOUNTER — Other Ambulatory Visit (HOSPITAL_BASED_OUTPATIENT_CLINIC_OR_DEPARTMENT_OTHER): Payer: Medicaid Other

## 2014-09-30 ENCOUNTER — Ambulatory Visit (HOSPITAL_BASED_OUTPATIENT_CLINIC_OR_DEPARTMENT_OTHER): Payer: Medicaid Other | Admitting: Hematology and Oncology

## 2014-09-30 VITALS — BP 124/75 | HR 77 | Temp 98.3°F | Resp 18 | Ht 79.0 in | Wt 223.6 lb

## 2014-09-30 DIAGNOSIS — G620 Drug-induced polyneuropathy: Secondary | ICD-10-CM

## 2014-09-30 DIAGNOSIS — R4702 Dysphasia: Secondary | ICD-10-CM | POA: Diagnosis not present

## 2014-09-30 DIAGNOSIS — C099 Malignant neoplasm of tonsil, unspecified: Secondary | ICD-10-CM | POA: Diagnosis present

## 2014-09-30 DIAGNOSIS — R1312 Dysphagia, oropharyngeal phase: Secondary | ICD-10-CM | POA: Diagnosis not present

## 2014-09-30 DIAGNOSIS — T451X5A Adverse effect of antineoplastic and immunosuppressive drugs, initial encounter: Secondary | ICD-10-CM

## 2014-09-30 DIAGNOSIS — R1012 Left upper quadrant pain: Secondary | ICD-10-CM | POA: Insufficient documentation

## 2014-09-30 DIAGNOSIS — T8189XD Other complications of procedures, not elsewhere classified, subsequent encounter: Secondary | ICD-10-CM

## 2014-09-30 LAB — CBC WITH DIFFERENTIAL/PLATELET
BASO%: 0.2 % (ref 0.0–2.0)
BASOS ABS: 0 10*3/uL (ref 0.0–0.1)
EOS ABS: 0.1 10*3/uL (ref 0.0–0.5)
EOS%: 1.1 % (ref 0.0–7.0)
HCT: 40.1 % (ref 38.4–49.9)
HEMOGLOBIN: 13 g/dL (ref 13.0–17.1)
LYMPH%: 9.7 % — AB (ref 14.0–49.0)
MCH: 28.9 pg (ref 27.2–33.4)
MCHC: 32.4 g/dL (ref 32.0–36.0)
MCV: 89.1 fL (ref 79.3–98.0)
MONO#: 0.5 10*3/uL (ref 0.1–0.9)
MONO%: 8.6 % (ref 0.0–14.0)
NEUT%: 80.4 % — AB (ref 39.0–75.0)
NEUTROS ABS: 4.5 10*3/uL (ref 1.5–6.5)
PLATELETS: 191 10*3/uL (ref 140–400)
RBC: 4.5 10*6/uL (ref 4.20–5.82)
RDW: 15.1 % — ABNORMAL HIGH (ref 11.0–14.6)
WBC: 5.6 10*3/uL (ref 4.0–10.3)
lymph#: 0.5 10*3/uL — ABNORMAL LOW (ref 0.9–3.3)

## 2014-09-30 LAB — COMPREHENSIVE METABOLIC PANEL (CC13)
ALBUMIN: 3.8 g/dL (ref 3.5–5.0)
ALT: 20 U/L (ref 0–55)
ANION GAP: 11 meq/L (ref 3–11)
AST: 27 U/L (ref 5–34)
Alkaline Phosphatase: 51 U/L (ref 40–150)
BUN: 21.9 mg/dL (ref 7.0–26.0)
CHLORIDE: 105 meq/L (ref 98–109)
CO2: 24 meq/L (ref 22–29)
Calcium: 9 mg/dL (ref 8.4–10.4)
Creatinine: 1.8 mg/dL — ABNORMAL HIGH (ref 0.7–1.3)
EGFR: 51 mL/min/{1.73_m2} — ABNORMAL LOW (ref >90)
Glucose: 80 mg/dl (ref 70–140)
POTASSIUM: 4.4 meq/L (ref 3.5–5.1)
Sodium: 140 mEq/L (ref 136–145)
Total Bilirubin: 0.7 mg/dL (ref 0.20–1.20)
Total Protein: 6.9 g/dL (ref 6.4–8.3)

## 2014-09-30 MED ORDER — OXYCODONE-ACETAMINOPHEN 5-325 MG PO TABS: 1.0000 | ORAL_TABLET | Freq: Three times a day (TID) | ORAL | Status: DC | PRN

## 2014-09-30 NOTE — Telephone Encounter (Signed)
Gave and printed appt sched and avs for pt for Sept °

## 2014-09-30 NOTE — Assessment & Plan Note (Signed)
He has significant oropharyngeal dysphagia from prior treatment. He will continue swallow exercises. He denies recent choking sensation.

## 2014-09-30 NOTE — Assessment & Plan Note (Signed)
He has significant dysphasia and slurred speech, likely related to posttreatment side effects. This has improved slightly since his last visit I recommend he follows closely with the speech and language therapist.

## 2014-09-30 NOTE — Assessment & Plan Note (Signed)
His neuropathy is improving and he is not using Neurontin as much. I will continue my effort of weaning him off pain medicine.

## 2014-09-30 NOTE — Progress Notes (Signed)
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 FNA of right level III lymph node was inconclusive for cancer   07/25/2013 Imaging PET scan showed locally advanced disease with abnormal lymphadenopathy in the right axilla   08/06/2013 Procedure CT-guided biopsy of the lymphadenopathy was negative for malignancy   08/15/2013 Surgery Patient has placement of port and feeding tube   08/19/2013 - 09/10/2013 Chemotherapy Patient received chemotherapy with cisplatin. The patient only received 2 doses due to uncontrolled nausea and acute renal failure.   08/19/2013 - 10/15/2013 Radiation Therapy Patient received radiation treatment   08/27/2013 - 08/30/2013 Hospital Admission The patient was admitted to the hospital for uncontrolled nausea vomiting and dehydration.   02/14/2014 Imaging PET/CT scan showed complete response to treatment   03/19/2014 Surgery He had excisional lymph node biopsy from the right neck. Pathology was benign   05/13/2014 Surgery He had removal of Port-A-Cath.   05/22/2014 Imaging Repeat CT scan of the neck show no evidence of disease recurrence.    INTERVAL HISTORY: Please see below for problem oriented charting. Overall, he is doing well. He denies throat pain. He has intermittent incisional  abdominal wall pain from prior feeding tube site. Denies dysphagia. He denies new lymphadenopathy.  REVIEW OF SYSTEMS:   Constitutional: Denies fevers, chills or abnormal weight loss Eyes: Denies blurriness of vision Ears, nose, mouth, throat, and face: Denies mucositis or sore throat Respiratory: Denies cough, dyspnea or wheezes Cardiovascular: Denies palpitation, chest discomfort or lower extremity swelling Gastrointestinal:  Denies nausea, heartburn or change in bowel habits Skin: Denies abnormal skin rashes Lymphatics: Denies new lymphadenopathy or easy bruising Neurological:Denies numbness, tingling or new weaknesses Behavioral/Psych: Mood is stable, no new changes  All other systems were reviewed with the patient and are negative.  I have reviewed the past medical history, past surgical history, social history and family history with the patient and they are unchanged from previous note.  ALLERGIES:  is allergic to pollen extract.  MEDICATIONS:  Current Outpatient Prescriptions  Medication Sig Dispense Refill  . fluticasone (FLONASE) 50 MCG/ACT nasal spray Place 2 sprays into both nostrils daily. 16 g 2  . gabapentin (NEURONTIN) 300 MG capsule Take 1 capsule (300 mg total) by mouth 3 (three) times daily. 90 capsule 6  . naproxen sodium (ANAPROX) 220 MG tablet Take 220 mg by mouth 2 (two) times daily as needed.    Marland Kitchen oxyCODONE-acetaminophen (PERCOCET/ROXICET) 5-325 MG per tablet Take 1 tablet by mouth every 8 (eight) hours as needed for severe pain. 90 tablet 0   No current facility-administered medications for this visit.    PHYSICAL EXAMINATION: ECOG PERFORMANCE STATUS: 0 - Asymptomatic  Filed Vitals:   09/30/14 1101  BP: 124/75  Pulse: 77  Temp: 98.3 F (36.8 C)  Resp: 18   Filed Weights  09/30/14 1101  Weight: 223 lb 9.6 oz (101.424 kg)    GENERAL:alert, no distress and comfortable SKIN: skin color, texture, turgor are normal, no rashes or significant  lesions EYES: normal, Conjunctiva are pink and non-injected, sclera clear OROPHARYNX:no exudate, no erythema and lips, buccal mucosa, and tongue normal  NECK: Neck is Woody from prior radiation and surgery.  LYMPH:  no palpable lymphadenopathy in the cervical, axillary or inguinal LUNGS: clear to auscultation and percussion with normal breathing effort HEART: regular rate & rhythm and no murmurs and no lower extremity edema ABDOMEN:abdomen soft, non-tender and normal bowel sounds. Well-healed surgical scars Musculoskeletal:no cyanosis of digits and no clubbing  NEURO: alert & oriented x 3 with fluent speech, no focal motor/sensory deficits  LABORATORY DATA:  I have reviewed the data as listed    Component Value Date/Time   NA 140 09/30/2014 1047   NA 140 05/12/2014 1438   K 4.4 09/30/2014 1047   K 4.2 05/12/2014 1438   CL 104 05/12/2014 1438   CO2 24 09/30/2014 1047   CO2 30 05/12/2014 1438   GLUCOSE 80 09/30/2014 1047   GLUCOSE 84 05/12/2014 1438   BUN 21.9 09/30/2014 1047   BUN 8 05/12/2014 1438   CREATININE 1.8* 09/30/2014 1047   CREATININE 1.31 05/12/2014 1438   CALCIUM 9.0 09/30/2014 1047   CALCIUM 9.3 05/12/2014 1438   PROT 6.9 09/30/2014 1047   PROT 7.5 09/30/2013 0522   ALBUMIN 3.8 09/30/2014 1047   ALBUMIN 3.4* 09/30/2013 0522   AST 27 09/30/2014 1047   AST 24 09/30/2013 0522   ALT 20 09/30/2014 1047   ALT 43 09/30/2013 0522   ALKPHOS 51 09/30/2014 1047   ALKPHOS 101 09/30/2013 0522   BILITOT 0.70 09/30/2014 1047   BILITOT 0.3 09/30/2013 0522   GFRNONAA 64* 05/12/2014 1438   GFRAA 74* 05/12/2014 1438    No results found for: SPEP, UPEP  Lab Results  Component Value Date   WBC 5.6 09/30/2014   NEUTROABS 4.5 09/30/2014   HGB 13.0 09/30/2014   HCT 40.1 09/30/2014   MCV 89.1 09/30/2014   PLT 191 09/30/2014      Chemistry      Component Value Date/Time   NA 140 09/30/2014 1047   NA 140 05/12/2014 1438   K 4.4 09/30/2014 1047   K 4.2 05/12/2014 1438    CL 104 05/12/2014 1438   CO2 24 09/30/2014 1047   CO2 30 05/12/2014 1438   BUN 21.9 09/30/2014 1047   BUN 8 05/12/2014 1438   CREATININE 1.8* 09/30/2014 1047   CREATININE 1.31 05/12/2014 1438      Component Value Date/Time   CALCIUM 9.0 09/30/2014 1047   CALCIUM 9.3 05/12/2014 1438   ALKPHOS 51 09/30/2014 1047   ALKPHOS 101 09/30/2013 0522   AST 27 09/30/2014 1047   AST 24 09/30/2013 0522   ALT 20 09/30/2014 1047   ALT 43 09/30/2013 0522   BILITOT 0.70 09/30/2014 1047   BILITOT 0.3 09/30/2013 0522     ASSESSMENT & PLAN:  Tonsillar cancer Repeat CT scan of the neck just show postsurgical changes. The neck wound had healed with significant scarring His insurance company has denied CT scan. We will continue follow-up closely by clinical exam and ENT surveillance endoscopy.  Dysphagia, oropharyngeal  He has significant oropharyngeal dysphagia from prior treatment. He will continue swallow exercises. He denies recent choking sensation.   Acquired dysphasia He has significant dysphasia and slurred speech, likely related to posttreatment side effects. This  has improved slightly since his last visit I recommend he follows closely with the speech and language therapist.  Neuropathy due to chemotherapeutic drug His neuropathy is improving and he is not using Neurontin as much. I will continue my effort of weaning him off pain medicine.   Abdominal wall pain in left upper quadrant This is related to incisional pain from prior feeding tube placement site. I recommend topical lidocaine cream as needed.   No orders of the defined types were placed in this encounter.   All questions were answered. The patient knows to call the clinic with any problems, questions or concerns. No barriers to learning was detected. I spent 25 minutes counseling the patient face to face. The total time spent in the appointment was 30 minutes and more than 50% was on counseling and review of test  results     [facility], [place], MD 09/30/2014 12:47 PM

## 2014-09-30 NOTE — Assessment & Plan Note (Signed)
This is related to incisional pain from prior feeding tube placement site. I recommend topical lidocaine cream as needed.

## 2014-09-30 NOTE — Assessment & Plan Note (Signed)
Repeat CT scan of the neck just show postsurgical changes. The neck wound had healed with significant scarring His insurance company has denied CT scan. We will continue follow-up closely by clinical exam and ENT surveillance endoscopy.

## 2014-10-01 ENCOUNTER — Ambulatory Visit: Payer: Self-pay | Admitting: Hematology and Oncology

## 2014-10-03 ENCOUNTER — Ambulatory Visit: Payer: Self-pay | Admitting: Hematology and Oncology

## 2014-12-02 ENCOUNTER — Ambulatory Visit (HOSPITAL_BASED_OUTPATIENT_CLINIC_OR_DEPARTMENT_OTHER): Payer: Medicaid Other

## 2014-12-02 ENCOUNTER — Encounter: Payer: Self-pay | Admitting: Hematology and Oncology

## 2014-12-02 ENCOUNTER — Ambulatory Visit (HOSPITAL_BASED_OUTPATIENT_CLINIC_OR_DEPARTMENT_OTHER): Payer: Medicaid Other | Admitting: Hematology and Oncology

## 2014-12-02 ENCOUNTER — Ambulatory Visit: Payer: Self-pay | Admitting: Hematology and Oncology

## 2014-12-02 ENCOUNTER — Telehealth: Payer: Self-pay | Admitting: Hematology and Oncology

## 2014-12-02 VITALS — BP 121/73 | HR 71 | Temp 98.4°F | Resp 18 | Ht 79.0 in | Wt 230.2 lb

## 2014-12-02 DIAGNOSIS — R070 Pain in throat: Secondary | ICD-10-CM

## 2014-12-02 DIAGNOSIS — H9191 Unspecified hearing loss, right ear: Secondary | ICD-10-CM | POA: Diagnosis not present

## 2014-12-02 DIAGNOSIS — R1312 Dysphagia, oropharyngeal phase: Secondary | ICD-10-CM | POA: Diagnosis not present

## 2014-12-02 DIAGNOSIS — R4702 Dysphasia: Secondary | ICD-10-CM

## 2014-12-02 DIAGNOSIS — C099 Malignant neoplasm of tonsil, unspecified: Secondary | ICD-10-CM

## 2014-12-02 DIAGNOSIS — N183 Chronic kidney disease, stage 3 unspecified: Secondary | ICD-10-CM | POA: Insufficient documentation

## 2014-12-02 DIAGNOSIS — T8189XD Other complications of procedures, not elsewhere classified, subsequent encounter: Secondary | ICD-10-CM

## 2014-12-02 DIAGNOSIS — N182 Chronic kidney disease, stage 2 (mild): Secondary | ICD-10-CM

## 2014-12-02 LAB — COMPREHENSIVE METABOLIC PANEL (CC13)
ALT: 18 U/L (ref 0–55)
ANION GAP: 7 meq/L (ref 3–11)
AST: 19 U/L (ref 5–34)
Albumin: 3.9 g/dL (ref 3.5–5.0)
Alkaline Phosphatase: 48 U/L (ref 40–150)
BUN: 21.1 mg/dL (ref 7.0–26.0)
CHLORIDE: 106 meq/L (ref 98–109)
CO2: 28 mEq/L (ref 22–29)
Calcium: 9.2 mg/dL (ref 8.4–10.4)
Creatinine: 1.8 mg/dL — ABNORMAL HIGH (ref 0.7–1.3)
EGFR: 52 mL/min/{1.73_m2} — AB (ref >90)
GLUCOSE: 79 mg/dL (ref 70–140)
POTASSIUM: 4.6 meq/L (ref 3.5–5.1)
Sodium: 141 mEq/L (ref 136–145)
TOTAL PROTEIN: 6.6 g/dL (ref 6.4–8.3)
Total Bilirubin: 0.44 mg/dL (ref 0.20–1.20)

## 2014-12-02 LAB — CBC WITH DIFFERENTIAL/PLATELET
BASO%: 0.4 % (ref 0.0–2.0)
Basophils Absolute: 0 10*3/uL (ref 0.0–0.1)
EOS%: 1 % (ref 0.0–7.0)
Eosinophils Absolute: 0.1 10*3/uL (ref 0.0–0.5)
HCT: 38.6 % (ref 38.4–49.9)
HGB: 12.5 g/dL — ABNORMAL LOW (ref 13.0–17.1)
LYMPH%: 9.5 % — AB (ref 14.0–49.0)
MCH: 29.6 pg (ref 27.2–33.4)
MCHC: 32.3 g/dL (ref 32.0–36.0)
MCV: 91.8 fL (ref 79.3–98.0)
MONO#: 0.5 10*3/uL (ref 0.1–0.9)
MONO%: 8.6 % (ref 0.0–14.0)
NEUT#: 4.6 10*3/uL (ref 1.5–6.5)
NEUT%: 80.5 % — ABNORMAL HIGH (ref 39.0–75.0)
Platelets: 205 10*3/uL (ref 140–400)
RBC: 4.21 10*6/uL (ref 4.20–5.82)
RDW: 15.2 % — AB (ref 11.0–14.6)
WBC: 5.7 10*3/uL (ref 4.0–10.3)
lymph#: 0.5 10*3/uL — ABNORMAL LOW (ref 0.9–3.3)

## 2014-12-02 MED ORDER — OXYCODONE-ACETAMINOPHEN 5-325 MG PO TABS: 1.0000 | ORAL_TABLET | Freq: Three times a day (TID) | ORAL | Status: DC | PRN

## 2014-12-02 MED ORDER — LIDOCAINE-PRILOCAINE 2.5-2.5 % EX CREA: 1.0000 "application " | TOPICAL_CREAM | CUTANEOUS | Status: DC | PRN

## 2014-12-02 NOTE — Assessment & Plan Note (Signed)
Clinically, he has new symptoms or worsening pain over the past 1-1/2 months with associated right-sided hearing loss. He has persistent nonhealing wound at the angle of the jaw or from prior surgery. With new change of symptoms, I am concerned that he may have disease recurrence. I will order blood work today and CT scan week for further assessment

## 2014-12-02 NOTE — Progress Notes (Signed)
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 FNA of right level III lymph node was inconclusive for cancer   07/25/2013 Imaging PET scan showed locally advanced disease with abnormal lymphadenopathy in Mason right axilla   08/06/2013 Procedure CT-guided biopsy of Mason lymphadenopathy was negative for malignancy   08/15/2013 Surgery [name] has placement of port and feeding tube   08/19/2013 - 09/10/2013 Chemotherapy [name] received chemotherapy with cisplatin. Mason [name] only received 2 doses due to uncontrolled nausea and acute renal failure.   08/19/2013 - 10/15/2013 Radiation Therapy [name] received radiation treatment   08/27/2013 - 08/30/2013 Hospital Admission [name] was admitted to [facility] hospital for uncontrolled nausea vomiting and dehydration.   02/14/2014 Imaging PET/CT scan showed complete response to treatment   03/19/2014 Surgery [facility] [name] had excisional lymph node biopsy from Mason right neck. Pathology was benign   05/13/2014 Surgery [facility] [name] had removal of Port-A-Cath.   05/22/2014 Imaging Repeat CT scan of Mason neck show no evidence of disease recurrence.    INTERVAL HISTORY: Please see below for problem oriented charting. Mason [name] returns for further follow-up. Mason [name] complained of unilateral right-sided hearing  loss with worsening neck pain and right sided neck swelling. Mason [name] had a persistent nonhealing wound in Mason neck but is not causing too much discharge. Mason neck pain since to be worse at nighttime. Mason [name] has difficulties with swallowing but denies choking sensation. Mason [name] has speech disturbance with mild slurring of speech. 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 Mason [name] has persistent neck pain, worse at nighttime  REVIEW OF SYSTEMS:   Constitutional: Denies fevers, chills or abnormal weight loss Eyes: Denies blurriness of vision Respiratory: Denies cough, dyspnea or wheezes Cardiovascular: Denies palpitation, chest discomfort or lower extremity swelling Gastrointestinal:  Denies nausea, heartburn or change in bowel habits Skin: Denies abnormal skin rashes Lymphatics: Denies new lymphadenopathy or easy bruising Neurological:Denies numbness, tingling or new weaknesses Behavioral/Psych: Mood is stable, no new changes  All other systems were reviewed with Mason [name] and are negative.  I have reviewed Mason past medical history, past surgical history, social history and family history with Mason [name] and they are unchanged from previous note.  ALLERGIES:  is allergic to pollen extract.  MEDICATIONS:  Current Outpatient Prescriptions  Medication Sig Dispense Refill  . fluticasone (FLONASE) 50 MCG/ACT nasal spray Place 2 sprays into both nostrils daily. 16 g 2  . gabapentin (NEURONTIN) 300 MG capsule Take 1 capsule (300 mg total) by mouth 3 (three) times daily. 90 capsule 6  . naproxen sodium (ANAPROX) 220 MG tablet Take 220 mg by mouth 2 (two) times daily as needed.    Marland Kitchen oxyCODONE-acetaminophen (PERCOCET/ROXICET) 5-325 MG per tablet Take 1 tablet by mouth every 8 (eight) hours as needed for severe pain.  90 tablet 0  . lidocaine-prilocaine (EMLA) cream Apply 1 application topically as needed. 30 g 6   No current facility-administered medications for this visit.     PHYSICAL EXAMINATION: ECOG PERFORMANCE STATUS: 1 - Symptomatic but completely ambulatory  Filed Vitals:   12/02/14 1307  BP: 121/73  Pulse: 71  Temp: 98.4 F (36.9 C)  Resp: 18   Filed Weights   12/02/14 1307  Weight: 230 lb 3.2 oz (104.418 kg)    GENERAL:alert, no distress and comfortable SKIN: skin color, texture, turgor are normal, no rashes or significant lesions EYES: normal, Conjunctiva are pink and non-injected, sclera clear OROPHARYNX:no exudate, no erythema and lips, buccal mucosa, and tongue normal  NECK: persistent swelling on Mason right angle of Mason jaw with a small area measure 5 mm that is not completely healed LYMPH:  no palpable lymphadenopathy in Mason cervical, axillary or inguinal LUNGS: clear to auscultation and percussion with normal breathing effort HEART: regular rate & rhythm and no murmurs and no lower extremity edema ABDOMEN:abdomen soft, non-tender and normal bowel sounds Musculoskeletal:no cyanosis of digits and no clubbing  NEURO: alert & oriented x 3 with Mild dysarthria, no focal motor/sensory deficits. Noted Mason [name] has right-sided hearing deficit  LABORATORY DATA:  I have reviewed Mason data as listed    Component Value Date/Time   NA 141 12/02/2014 1347   NA 140 05/12/2014 1438   K 4.6 12/02/2014 1347   K 4.2 05/12/2014 1438   CL 104 05/12/2014 1438   CO2 28 12/02/2014 1347   CO2 30 05/12/2014 1438   GLUCOSE 79 12/02/2014 1347   GLUCOSE 84 05/12/2014 1438   BUN 21.1 12/02/2014 1347   BUN 8 05/12/2014 1438   CREATININE 1.8* 12/02/2014 1347   CREATININE 1.31 05/12/2014 1438   CALCIUM 9.2 12/02/2014 1347   CALCIUM 9.3 05/12/2014 1438   PROT 6.6 12/02/2014 1347   PROT 7.5 09/30/2013 0522   ALBUMIN 3.9 12/02/2014 1347   ALBUMIN 3.4* 09/30/2013 0522   AST 19 12/02/2014 1347   AST 24 09/30/2013 0522   ALT 18 12/02/2014 1347   ALT 43 09/30/2013 0522   ALKPHOS 48 12/02/2014 1347   ALKPHOS 101 09/30/2013 0522   BILITOT 0.44  12/02/2014 1347   BILITOT 0.3 09/30/2013 0522   GFRNONAA 64* 05/12/2014 1438   GFRAA 74* 05/12/2014 1438    No results found for: SPEP, UPEP  Lab Results  Component Value Date   WBC 5.7 12/02/2014   NEUTROABS 4.6 12/02/2014   HGB 12.5* 12/02/2014   HCT 38.6 12/02/2014   MCV 91.8 12/02/2014   PLT 205 12/02/2014      Chemistry      Component Value Date/Time   NA 141 12/02/2014 1347   NA 140 05/12/2014 1438   K 4.6 12/02/2014 1347   K 4.2 05/12/2014 1438   CL 104 05/12/2014 1438   CO2 28 12/02/2014 1347   CO2 30 05/12/2014 1438   BUN 21.1 12/02/2014 1347   BUN 8 05/12/2014 1438   CREATININE 1.8* 12/02/2014 1347   CREATININE 1.31 05/12/2014 1438      Component Value Date/Time   CALCIUM 9.2 12/02/2014 1347   CALCIUM 9.3 05/12/2014 1438   ALKPHOS 48 12/02/2014 1347   ALKPHOS 101 09/30/2013 0522   AST 19 12/02/2014 1347   AST 24 09/30/2013 0522   ALT 18 12/02/2014 1347   ALT 43 09/30/2013 0522   BILITOT 0.44 12/02/2014 1347   BILITOT 0.3 09/30/2013 0522  ASSESSMENT & PLAN:  Tonsillar cancer  Clinically, Mason [name] has new symptoms or worsening pain over Mason past 1-1/2 months with associated right-sided hearing loss. Mason [name] has persistent nonhealing wound at Mason angle of Mason jaw or from prior surgery. With new change of symptoms, I am concerned that Mason [name] may have disease recurrence. I will order blood work today and CT scan week for further assessment  Acquired dysphasia Mason [name] has significant dysphasia and slurred speech, likely related to posttreatment side effects. This has improved slightly since his last visit I recommend Mason [name] follows closely with Mason speech and language therapist.  Dysphagia, oropharyngeal  Mason [name] has significant oropharyngeal dysphagia from prior treatment. Mason [name] will continue swallow exercises. Mason [name] denies recent choking sensation.   Non-healing surgical wound Mason [name] has an area of persistent nonhealing wound. I gave him prescription Percocet to take as needed  for pain. I will assessed with CT scan to make sure that Mason [name] does not have fluid collection/abscess that might have caused cutaneous fistula   Throat pain in adult Mason [name] has persistent throat pain. I refill his prescription oxycodone one more time. I told him I will gradually wean him off his pain medicine over Mason next few months.    Hearing loss in right ear Mason [name] has unilateral hearing loss of unknown etiology which is new. I am concerned about cancer recurrence and recommend CT scan before I refer him back to ENT for hearing tests assessment  Chronic kidney disease (CKD), stage II (mild) Mason [name] has stable chronic kidney disease stage II since chemotherapy treatment. I recommend Mason [name] drinks plenty of fluids after CT scan next week to reduce risk of contrast nephropathy.   Orders Placed This Encounter  Procedures  . CT Soft Tissue Neck W Contrast    Standing Status: Future     Number of Occurrences:      Standing Expiration Date: 03/03/2016    Order Specific Question:  Reason for Exam (SYMPTOM  OR DIAGNOSIS REQUIRED)    Answer:  neck mass/swelling right neck, new right sided hearing loss, Hx tonsil ca    Order Specific Question:  Preferred imaging location?    Answer:  [facility]  . Comprehensive metabolic panel    Standing Status: Future     Number of Occurrences: 1     Standing Expiration Date: 01/06/2016  . CBC with Differential/Platelet    Standing Status: Future     Number of Occurrences: 1     Standing Expiration Date: 01/06/2016   All questions were answered. Mason [name] knows to call [facility] clinic with any problems, questions or concerns. No barriers to learning was detected. I spent 25 minutes counseling Mason [name] face to face. Mason total time spent in Mason appointment was 40 minutes and more than 50% was on counseling and review of test results     [facility], [place], MD 12/02/2014 3:24 PM

## 2014-12-02 NOTE — Assessment & Plan Note (Signed)
He has significant dysphasia and slurred speech, likely related to posttreatment side effects. This has improved slightly since his last visit I recommend he follows closely with the speech and language therapist.

## 2014-12-02 NOTE — Assessment & Plan Note (Signed)
He has unilateral hearing loss of unknown etiology which is new. I am concerned about cancer recurrence and recommend CT scan before I refer him back to ENT for hearing tests assessment

## 2014-12-02 NOTE — Assessment & Plan Note (Signed)
He has significant oropharyngeal dysphagia from prior treatment. He will continue swallow exercises. He denies recent choking sensation.

## 2014-12-02 NOTE — Assessment & Plan Note (Signed)
He has persistent throat pain. I refill his prescription oxycodone one more time. I told him I will gradually wean him off his pain medicine over the next few months.

## 2014-12-02 NOTE — Assessment & Plan Note (Signed)
He has stable chronic kidney disease stage II since chemotherapy treatment. I recommend he drinks plenty of fluids after CT scan next week to reduce risk of contrast nephropathy.

## 2014-12-02 NOTE — Assessment & Plan Note (Signed)
He has an area of persistent nonhealing wound. I gave him prescription Percocet to take as needed for pain. I will assessed with CT scan to make sure that he does not have fluid collection/abscess that might have caused cutaneous fistula

## 2014-12-02 NOTE — Telephone Encounter (Signed)
Gave avs & calendar for August °

## 2014-12-09 ENCOUNTER — Ambulatory Visit (HOSPITAL_COMMUNITY)
Admission: RE | Admit: 2014-12-09 | Discharge: 2014-12-09 | Disposition: A | Discharge status: Home / Self Care | Payer: Medicaid Other | Source: Ambulatory Visit | Attending: Hematology and Oncology | Admitting: Hematology and Oncology

## 2014-12-09 ENCOUNTER — Encounter (HOSPITAL_COMMUNITY): Payer: Self-pay

## 2014-12-09 ENCOUNTER — Other Ambulatory Visit: Payer: Self-pay | Admitting: Hematology and Oncology

## 2014-12-09 DIAGNOSIS — H9191 Unspecified hearing loss, right ear: Secondary | ICD-10-CM | POA: Insufficient documentation

## 2014-12-09 DIAGNOSIS — R221 Localized swelling, mass and lump, neck: Secondary | ICD-10-CM | POA: Insufficient documentation

## 2014-12-09 DIAGNOSIS — C099 Malignant neoplasm of tonsil, unspecified: Secondary | ICD-10-CM

## 2014-12-09 DIAGNOSIS — Z8589 Personal history of malignant neoplasm of other organs and systems: Secondary | ICD-10-CM | POA: Insufficient documentation

## 2014-12-10 ENCOUNTER — Ambulatory Visit: Payer: Self-pay | Admitting: Hematology and Oncology

## 2014-12-10 ENCOUNTER — Telehealth: Payer: Self-pay | Admitting: Hematology and Oncology

## 2014-12-10 NOTE — Telephone Encounter (Signed)
s.w. pt and advised on new d.t...Marland KitchenMarland Kitchenpt ok and aware

## 2014-12-17 ENCOUNTER — Encounter: Payer: Self-pay | Admitting: Hematology and Oncology

## 2014-12-17 ENCOUNTER — Ambulatory Visit (HOSPITAL_BASED_OUTPATIENT_CLINIC_OR_DEPARTMENT_OTHER): Payer: Medicaid Other | Admitting: Hematology and Oncology

## 2014-12-17 VITALS — BP 137/87 | HR 62 | Temp 98.2°F | Resp 18 | Ht 79.0 in | Wt 229.2 lb

## 2014-12-17 DIAGNOSIS — N182 Chronic kidney disease, stage 2 (mild): Secondary | ICD-10-CM

## 2014-12-17 DIAGNOSIS — C099 Malignant neoplasm of tonsil, unspecified: Secondary | ICD-10-CM

## 2014-12-17 DIAGNOSIS — T8189XS Other complications of procedures, not elsewhere classified, sequela: Secondary | ICD-10-CM

## 2014-12-17 NOTE — Assessment & Plan Note (Signed)
He has a small area or persistent non-healing wound that is causing significant pain. Attention fortunately, his insurance company denied his recent prescription pain medicine. He brought the medication out-of-pocket. In the future, I will switch him to IR morphine.

## 2014-12-17 NOTE — Assessment & Plan Note (Signed)
He has stable chronic kidney disease stage II since chemotherapy treatment. I recommend he drinks plenty of fluids.  I recommend he discontinue all NSAID

## 2014-12-17 NOTE — Assessment & Plan Note (Signed)
I reviewed the imaging study with the patient.  the CT scan was done without contrast due to chronic renal failure. Unfortunately, I cannot be sure whether the abnormalities seen on the CT scan is related to residual cancer versus just scar tissue. I discussed with him the reason to order a PET CT scan for further assessment and he agreed to proceed.

## 2014-12-17 NOTE — Progress Notes (Signed)
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 FNA of right level III lymph node was inconclusive for cancer   07/25/2013 Imaging PET scan showed locally advanced disease with abnormal lymphadenopathy in the right axilla   08/06/2013 Procedure CT-guided biopsy of the lymphadenopathy was negative for malignancy   08/15/2013 Surgery Patient has placement of port and feeding tube   08/19/2013 - 09/10/2013 Chemotherapy Patient received chemotherapy with cisplatin. The patient only received 2 doses due to uncontrolled nausea and acute renal failure.   08/19/2013 - 10/15/2013 Radiation Therapy Patient received radiation treatment   08/27/2013 - 08/30/2013 Hospital Admission The patient was admitted to the hospital for uncontrolled nausea vomiting and dehydration.   02/14/2014 Imaging PET/CT scan showed complete response to treatment   03/19/2014 Surgery Mason [name] had excisional lymph node biopsy from the right neck. Pathology was benign   05/13/2014 Surgery Mason [name] had removal of Port-A-Cath.   05/22/2014 Imaging Repeat CT scan of the neck show no evidence of disease recurrence.   12/09/2014 Imaging Ct neck without contrast showed persistent abnormalities on the right side of neck, indeterminate    INTERVAL HISTORY: Please see  below for problem oriented charting.  Mason [name] returns for further follow-up. Mason [name] has persistent right jaw pain. The swelling near the right has improved a little bit since I saw him.  Mason [name] has still by his recent prescription pain medicine out-of-pocket because his insurance company denied payment  REVIEW OF SYSTEMS:   Constitutional: Denies fevers, chills or abnormal weight loss Eyes: Denies blurriness of vision Ears, nose, mouth, throat, and face: Denies mucositis or sore throat Respiratory: Denies cough, dyspnea or wheezes Cardiovascular: Denies palpitation, chest discomfort or lower extremity swelling Gastrointestinal:  Denies nausea, heartburn or change in bowel habits Skin: Denies abnormal skin rashes Lymphatics: Denies new lymphadenopathy or easy bruising Neurological:Denies numbness, tingling or new weaknesses Behavioral/Psych: Mood is stable, no new changes  All other systems were reviewed with the patient and are negative.  I have reviewed the past medical history, past surgical history, social history and family history with the patient and they are unchanged from previous note.  ALLERGIES:  is allergic to pollen extract.  MEDICATIONS:  Current Outpatient Prescriptions  Medication Sig Dispense Refill  . fluticasone (FLONASE) 50 MCG/ACT nasal spray Place 2 sprays into both nostrils daily. 16 g 2  . gabapentin (NEURONTIN) 300 MG capsule Take 1 capsule (300 mg total) by mouth 3 (three) times daily. 90 capsule 6  . lidocaine-prilocaine (EMLA) cream Apply 1 application topically as needed. 30 g 6  . naproxen sodium (ANAPROX) 220 MG tablet Take 220 mg by mouth 2 (two) times daily as needed.    Marland Kitchen oxyCODONE-acetaminophen (PERCOCET/ROXICET) 5-325 MG per tablet Take 1 tablet by mouth every 8 (eight) hours as needed for severe pain. 90 tablet 0  No current facility-administered medications for this visit.    PHYSICAL EXAMINATION: ECOG PERFORMANCE STATUS: 1 - Symptomatic but completely  ambulatory  Filed Vitals:   12/17/14 0938  BP: 137/87  Pulse: 62  Temp: 98.2 F (36.8 C)  Resp: 18   Filed Weights   12/17/14 0938  Weight: 229 lb 3.2 oz (103.964 kg)    GENERAL:alert, no distress and comfortable SKIN: skin color, texture, turgor are normal, no rashes or significant lesions EYES: normal, Conjunctiva are pink and non-injected, sclera clear OROPHARYNX:no exudate, no erythema and lips, buccal mucosa, and tongue normal  NECK: supple, Mason [name] has persistent swelling around the angle of the right jaw NEURO: alert & oriented x 3 with fluent speech, no focal motor/sensory deficits  LABORATORY DATA:  I have reviewed the data as listed    Component Value Date/Time   NA 141 12/02/2014 1347   NA 140 05/12/2014 1438   K 4.6 12/02/2014 1347   K 4.2 05/12/2014 1438   CL 104 05/12/2014 1438   CO2 28 12/02/2014 1347   CO2 30 05/12/2014 1438   GLUCOSE 79 12/02/2014 1347   GLUCOSE 84 05/12/2014 1438   BUN 21.1 12/02/2014 1347   BUN 8 05/12/2014 1438   CREATININE 1.8* 12/02/2014 1347   CREATININE 1.31 05/12/2014 1438   CALCIUM 9.2 12/02/2014 1347   CALCIUM 9.3 05/12/2014 1438   PROT 6.6 12/02/2014 1347   PROT 7.5 09/30/2013 0522   ALBUMIN 3.9 12/02/2014 1347   ALBUMIN 3.4* 09/30/2013 0522   AST 19 12/02/2014 1347   AST 24 09/30/2013 0522   ALT 18 12/02/2014 1347   ALT 43 09/30/2013 0522   ALKPHOS 48 12/02/2014 1347   ALKPHOS 101 09/30/2013 0522   BILITOT 0.44 12/02/2014 1347   BILITOT 0.3 09/30/2013 0522   GFRNONAA 64* 05/12/2014 1438   GFRAA 74* 05/12/2014 1438    No results found for: SPEP, UPEP  Lab Results  Component Value Date   WBC 5.7 12/02/2014   NEUTROABS 4.6 12/02/2014   HGB 12.5* 12/02/2014   HCT 38.6 12/02/2014   MCV 91.8 12/02/2014   PLT 205 12/02/2014      Chemistry      Component Value Date/Time   NA 141 12/02/2014 1347   NA 140 05/12/2014 1438   K 4.6 12/02/2014 1347   K 4.2 05/12/2014 1438   CL 104 05/12/2014 1438   CO2 28  12/02/2014 1347   CO2 30 05/12/2014 1438   BUN 21.1 12/02/2014 1347   BUN 8 05/12/2014 1438   CREATININE 1.8* 12/02/2014 1347   CREATININE 1.31 05/12/2014 1438      Component Value Date/Time   CALCIUM 9.2 12/02/2014 1347   CALCIUM 9.3 05/12/2014 1438   ALKPHOS 48 12/02/2014 1347   ALKPHOS 101 09/30/2013 0522   AST 19 12/02/2014 1347   AST 24 09/30/2013 0522   ALT 18 12/02/2014 1347   ALT 43 09/30/2013 0522   BILITOT 0.44 12/02/2014 1347   BILITOT 0.3 09/30/2013 0522       RADIOGRAPHIC STUDIES: I reviewed the last CT scan with him I have personally reviewed the radiological images as listed and agreed with the findings in the report.   ASSESSMENT & PLAN:  Tonsillar cancer  I reviewed the imaging study with the patient.  the CT scan was done without contrast due to chronic renal failure. Unfortunately, I cannot be sure whether the abnormalities seen on the CT scan is related to residual cancer versus just scar tissue. I discussed  with him the reason to order a PET CT scan for further assessment and Mason [name] agreed to proceed.  Chronic kidney disease (CKD), stage II (mild) Mason [name] has stable chronic kidney disease stage II since chemotherapy treatment. I recommend Mason [name] drinks plenty of fluids.  I recommend Mason [name] discontinue all NSAID   Non-healing surgical wound Mason [name] has a small area or persistent non-healing wound that is causing significant pain. Attention fortunately, his insurance company denied his recent prescription pain medicine. Mason [name] brought the medication out-of-pocket. In the future, I will switch him to IR morphine.   Orders Placed This Encounter  Procedures  . NM PET Image Restag (PS) Skull Base To Thigh    Standing Status: Future     Number of Occurrences:      Standing Expiration Date: 02/16/2016    Order Specific Question:  Reason for Exam (SYMPTOM  OR DIAGNOSIS REQUIRED)    Answer:  worsening right jaw pain, abnormal CT scan, tonsil ca    Order Specific Question:   Preferred imaging location?    Answer:  [facility]   All questions were answered. The patient knows to call the clinic with any problems, questions or concerns. No barriers to learning was detected. I spent 25 minutes counseling the patient face to face. The total time spent in the appointment was 30 minutes and more than 50% was on counseling and review of test results     [facility], Ridhi Hoffert, MD 12/17/2014 10:04 AM

## 2014-12-25 ENCOUNTER — Ambulatory Visit (HOSPITAL_COMMUNITY)
Admission: RE | Admit: 2014-12-25 | Discharge: 2014-12-25 | Disposition: A | Discharge status: Home / Self Care | Payer: Medicaid Other | Source: Ambulatory Visit | Attending: Hematology and Oncology | Admitting: Hematology and Oncology

## 2014-12-25 DIAGNOSIS — R911 Solitary pulmonary nodule: Secondary | ICD-10-CM | POA: Insufficient documentation

## 2014-12-25 DIAGNOSIS — R221 Localized swelling, mass and lump, neck: Secondary | ICD-10-CM | POA: Diagnosis not present

## 2014-12-25 DIAGNOSIS — Z923 Personal history of irradiation: Secondary | ICD-10-CM | POA: Diagnosis not present

## 2014-12-25 DIAGNOSIS — R6884 Jaw pain: Secondary | ICD-10-CM | POA: Insufficient documentation

## 2014-12-25 DIAGNOSIS — M542 Cervicalgia: Secondary | ICD-10-CM | POA: Insufficient documentation

## 2014-12-25 DIAGNOSIS — R590 Localized enlarged lymph nodes: Secondary | ICD-10-CM | POA: Insufficient documentation

## 2014-12-25 DIAGNOSIS — C099 Malignant neoplasm of tonsil, unspecified: Secondary | ICD-10-CM | POA: Insufficient documentation

## 2014-12-25 DIAGNOSIS — Z9221 Personal history of antineoplastic chemotherapy: Secondary | ICD-10-CM | POA: Diagnosis not present

## 2014-12-25 LAB — GLUCOSE, CAPILLARY: Glucose-Capillary: 78 mg/dL (ref 65–99)

## 2014-12-25 MED ORDER — FLUDEOXYGLUCOSE F - 18 (FDG) INJECTION
12.1700 | Freq: Once | INTRAVENOUS | Status: DC | PRN
  Administered 2014-12-25: 12.17 via INTRAVENOUS
  Filled 2014-12-25: qty 12.17

## 2014-12-30 ENCOUNTER — Encounter: Payer: Self-pay | Admitting: Hematology and Oncology

## 2014-12-30 ENCOUNTER — Ambulatory Visit (HOSPITAL_BASED_OUTPATIENT_CLINIC_OR_DEPARTMENT_OTHER): Payer: Medicaid Other | Admitting: Hematology and Oncology

## 2014-12-30 VITALS — BP 149/83 | HR 74 | Temp 98.0°F | Resp 18 | Ht 79.0 in | Wt 229.3 lb

## 2014-12-30 DIAGNOSIS — R918 Other nonspecific abnormal finding of lung field: Secondary | ICD-10-CM

## 2014-12-30 DIAGNOSIS — R070 Pain in throat: Secondary | ICD-10-CM | POA: Diagnosis not present

## 2014-12-30 DIAGNOSIS — C099 Malignant neoplasm of tonsil, unspecified: Secondary | ICD-10-CM

## 2014-12-30 MED ORDER — MORPHINE SULFATE 15 MG PO TABS: 15.0000 mg | ORAL_TABLET | Freq: Four times a day (QID) | ORAL | Status: DC | PRN

## 2014-12-30 NOTE — Progress Notes (Signed)
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 FNA of right level III lymph node was inconclusive for cancer   07/25/2013 Imaging PET scan showed locally advanced disease with abnormal lymphadenopathy in the right axilla   08/06/2013 Procedure CT-guided biopsy of the lymphadenopathy was negative for malignancy   08/15/2013 Surgery Patient has placement of port and feeding tube   08/19/2013 - 09/10/2013 Chemotherapy Patient received chemotherapy with cisplatin. The patient only received 2 doses due to uncontrolled nausea and acute renal failure.   08/19/2013 - 10/15/2013 Radiation Therapy Patient received radiation treatment   08/27/2013 - 08/30/2013 Hospital Admission The patient was admitted to the hospital for uncontrolled nausea vomiting and dehydration.   02/14/2014 Imaging PET/CT scan showed complete response to treatment   03/19/2014 Surgery He had excisional lymph node biopsy from the right neck. Pathology was benign   05/13/2014 Surgery He had removal of Port-A-Cath.   05/22/2014 Imaging Repeat CT scan of the neck show no evidence of disease recurrence.   12/09/2014 Imaging Ct neck without contrast showed persistent abnormalities on the right side of neck, indeterminate   12/25/2014 Imaging PET CT scan showed  disease recurrence.    INTERVAL HISTORY: Please see below for problem oriented charting. He returns to review test results. He continues to have pain in the right jaw. He denies recent fevers, chills or cough. The patient is tearful after being told about possible disease recurrence.  REVIEW OF SYSTEMS:   Constitutional: Denies fevers, chills or abnormal weight loss Eyes: Denies blurriness of vision Ears, nose, mouth, throat, and face: Denies mucositis or sore throat Respiratory: Denies cough, dyspnea or wheezes Cardiovascular: Denies palpitation, chest discomfort or lower extremity swelling Gastrointestinal:  Denies nausea, heartburn or change in bowel habits Skin: Denies abnormal skin rashes Lymphatics: Denies new lymphadenopathy or easy bruising Neurological:Denies numbness, tingling or new weaknesses Behavioral/Psych: Mood is stable, no new changes  All other systems were reviewed with the patient and are negative.  I have reviewed the past medical history, past surgical history, social history and family history with the patient and they are unchanged from previous note.  ALLERGIES:  is allergic to pollen extract.  MEDICATIONS:  Current Outpatient Prescriptions  Medication Sig Dispense Refill  . fluticasone (FLONASE) 50 MCG/ACT nasal spray Place 2 sprays into both nostrils daily. 16 g 2  . gabapentin (NEURONTIN) 300 MG capsule Take 1 capsule (300 mg total) by mouth 3 (three) times daily. 90 capsule 6  . lidocaine-prilocaine (EMLA) cream Apply 1 application topically as needed. 30 g 6  . morphine (MSIR) 15 MG tablet Take 1 tablet (15 mg total) by mouth every 6 (six) hours as needed for severe pain. 90 tablet 0   No current facility-administered medications for this visit.   Facility-Administered Medications Ordered in Other Visits  Medication  Dose Route Frequency Provider Last Rate Last Dose  . fludeoxyglucose F - 18 (FDG) injection 12.17 milli Curie  12.17 milli Curie  Intravenous Once PRN Medication Radiologist, MD   12.17 milli Curie at 12/25/14 1225    PHYSICAL EXAMINATION: ECOG PERFORMANCE STATUS: 1 - Symptomatic but completely ambulatory  Filed Vitals:   12/30/14 1249  BP: 149/83  Pulse: 74  Temp: 98 F (36.7 C)  Resp: 18   Filed Weights   12/30/14 1249  Weight: 229 lb 4.8 oz (104.01 kg)    GENERAL:alert, no distress and comfortable SKIN: skin color, texture, turgor are normal, no rashes or significant lesions EYES: normal, Conjunctiva are pink and non-injected, sclera clear OROPHARYNX:no exudate, no erythema and lips, buccal mucosa, and tongue normal  NECK: Persistent abnormality noted on the right jaw Musculoskeletal:no cyanosis of digits and no clubbing  NEURO: alert & oriented x 3 with fluent speech, no focal motor/sensory deficits  LABORATORY DATA:  I have reviewed the data as listed    Component Value Date/Time   NA 141 12/02/2014 1347   NA 140 05/12/2014 1438   K 4.6 12/02/2014 1347   K 4.2 05/12/2014 1438   CL 104 05/12/2014 1438   CO2 28 12/02/2014 1347   CO2 30 05/12/2014 1438   GLUCOSE 79 12/02/2014 1347   GLUCOSE 84 05/12/2014 1438   BUN 21.1 12/02/2014 1347   BUN 8 05/12/2014 1438   CREATININE 1.8* 12/02/2014 1347   CREATININE 1.31 05/12/2014 1438   CALCIUM 9.2 12/02/2014 1347   CALCIUM 9.3 05/12/2014 1438   PROT 6.6 12/02/2014 1347   PROT 7.5 09/30/2013 0522   ALBUMIN 3.9 12/02/2014 1347   ALBUMIN 3.4* 09/30/2013 0522   AST 19 12/02/2014 1347   AST 24 09/30/2013 0522   ALT 18 12/02/2014 1347   ALT 43 09/30/2013 0522   ALKPHOS 48 12/02/2014 1347   ALKPHOS 101 09/30/2013 0522   BILITOT 0.44 12/02/2014 1347   BILITOT 0.3 09/30/2013 0522   GFRNONAA 64* 05/12/2014 1438   GFRAA 74* 05/12/2014 1438    No results found for: SPEP, UPEP  Lab Results  Component Value Date   WBC 5.7 12/02/2014   NEUTROABS 4.6 12/02/2014   HGB 12.5* 12/02/2014   HCT 38.6 12/02/2014   MCV 91.8 12/02/2014   PLT 205  12/02/2014      Chemistry      Component Value Date/Time   NA 141 12/02/2014 1347   NA 140 05/12/2014 1438   K 4.6 12/02/2014 1347   K 4.2 05/12/2014 1438   CL 104 05/12/2014 1438   CO2 28 12/02/2014 1347   CO2 30 05/12/2014 1438   BUN 21.1 12/02/2014 1347   BUN 8 05/12/2014 1438   CREATININE 1.8* 12/02/2014 1347   CREATININE 1.31 05/12/2014 1438      Component Value Date/Time   CALCIUM 9.2 12/02/2014 1347   CALCIUM 9.3 05/12/2014 1438   ALKPHOS 48 12/02/2014 1347   ALKPHOS 101 09/30/2013 0522   AST 19 12/02/2014 1347   AST 24 09/30/2013 0522   ALT 18 12/02/2014 1347   ALT 43 09/30/2013 0522   BILITOT 0.44 12/02/2014 1347   BILITOT 0.3 09/30/2013 0522       RADIOGRAPHIC STUDIES: I reviewed the PET CT scan with the patient I have personally reviewed the radiological images as listed and agreed with the findings in the report.   ASSESSMENT & PLAN:  Tonsillar cancer Per discussion with ENT, he will place a referral to [facility]  ENT service for second opinion. I would defer treatment decision until after his appointment to [facility] The lung nodule and the right inguinal lymph node are nonspecific. I do not feel that the right inguinal lymph node represent metastatic disease, rather could be reactive in nature. If surgery is not an option, I would give him palliative chemotherapy.  Throat pain in adult He has poorly controlled pain with Percocet. I recommend increasing his pain medication to IR morphine to be taking along with Tylenol for pain control. I will reassess pain control in the next visit.  Lung nodule, multiple The lung nodules were nonspecific and below the threshold for PET CT scan for activity. He is not symptomatic. I recommend observation only for now.   No orders of the defined types were placed in this encounter.   All questions were answered. The patient knows to call the clinic with any problems, questions or concerns. No barriers to  learning was detected. I spent 25 minutes counseling the patient face to face. The total time spent in the appointment was 30 minutes and more than 50% was on counseling and review of test results     [facility], [place], MD 12/30/2014 1:35 PM

## 2014-12-30 NOTE — Assessment & Plan Note (Signed)
He has poorly controlled pain with Percocet. I recommend increasing his pain medication to IR morphine to be taking along with Tylenol for pain control. I will reassess pain control in the next visit.

## 2014-12-30 NOTE — Assessment & Plan Note (Addendum)
Per discussion with ENT, he will place a referral to [facility] ENT service for second opinion. I would defer treatment decision until after his appointment to [facility] The lung nodule and the right inguinal lymph node are nonspecific. I do not feel that the right inguinal lymph node represent metastatic disease, rather could be reactive in nature. If surgery is not an option, I would give him palliative chemotherapy.

## 2014-12-30 NOTE — Assessment & Plan Note (Signed)
The lung nodules were nonspecific and below the threshold for PET CT scan for activity. He is not symptomatic. I recommend observation only for now.

## 2015-01-09 ENCOUNTER — Telehealth: Payer: Self-pay | Admitting: *Deleted

## 2015-01-09 NOTE — Telephone Encounter (Signed)
PT. IS TO SEE THE SURGEON AT [facility] ON 01/26/15 AT 2:30PM.

## 2015-01-26 ENCOUNTER — Telehealth: Payer: Self-pay | Admitting: *Deleted

## 2015-01-26 ENCOUNTER — Other Ambulatory Visit: Payer: Self-pay | Admitting: Hematology and Oncology

## 2015-01-26 NOTE — Telephone Encounter (Signed)
Pt called to let Dr. Alvy Bimler know he is seeing ENT at [facility] today.  When I called pt back he was actually in their waiting room.  Asked pt to call us back and let us know what ENT says/ advises.  He agreed.

## 2015-01-27 ENCOUNTER — Other Ambulatory Visit: Payer: Self-pay | Admitting: Hematology and Oncology

## 2015-01-28 ENCOUNTER — Other Ambulatory Visit: Payer: Self-pay | Admitting: Hematology and Oncology

## 2015-01-30 ENCOUNTER — Other Ambulatory Visit: Payer: Self-pay | Admitting: Hematology and Oncology

## 2015-02-02 ENCOUNTER — Telehealth: Payer: Self-pay | Admitting: *Deleted

## 2015-02-02 ENCOUNTER — Other Ambulatory Visit: Payer: Self-pay | Admitting: Hematology and Oncology

## 2015-02-02 NOTE — Telephone Encounter (Signed)
  Oncology Nurse Navigator Documentation   Navigator Encounter Type: Telephone (02/02/15 1135) Patient Visit Type: Follow-up (02/02/15 1135)       Interventions: Coordination of Care (02/02/15 1135)       Spoke with Dyanne Iha, [facility] Dept of Otolaryngology.    She indicated absence of documentation that copies of patient's scans for Dr. Gavin Pound' have been requested by their office.    She asked if I would facilitate disc receipt of CT and PET scans from diagnosis to present.  She provided me mailing information including [employer] delivery #. I coordinated request with [facility].  She indicated scans would be sent by day's end to receipt tomorrow.  Gayleen Orem, RN, BSN, [place] at [place] [phone]         Time Spent with Patient: 30 (02/02/15 1135)

## 2015-02-02 NOTE — Telephone Encounter (Signed)
  Oncology Nurse Navigator Documentation   Navigator Encounter Type: Telephone (02/02/15 1120) Patient Visit Type: Follow-up (02/02/15 1120)     Called patient to inquire status of last week's [facility] consult.  He indicated he met with Dr. Jordan Hawks Bones last week 01/26/15.  She indicated tmt options are 1) minimal surgery (i.e. removal of tonsil), or 2) additional chemotherapy.  She is to call him for follow-up appt after her review of imaging conducted at [facility] which he understands has been requested. I indicated I will follow-up with her office.  Gayleen Orem, RN, BSN, [place] at [place] [phone]                   Time Spent with Patient: 15 (02/02/15 1120)

## 2015-02-06 ENCOUNTER — Telehealth: Payer: Self-pay | Admitting: *Deleted

## 2015-02-06 ENCOUNTER — Other Ambulatory Visit: Payer: Self-pay | Admitting: Radiology

## 2015-02-06 ENCOUNTER — Telehealth: Payer: Self-pay | Admitting: Hematology and Oncology

## 2015-02-06 ENCOUNTER — Other Ambulatory Visit: Payer: Self-pay | Admitting: *Deleted

## 2015-02-06 DIAGNOSIS — C099 Malignant neoplasm of tonsil, unspecified: Secondary | ICD-10-CM

## 2015-02-06 NOTE — Telephone Encounter (Signed)
Per Gayleen Orem, RN nav pt needs an urgent appt with Dr. Alvy Bimler.

## 2015-02-06 NOTE — Telephone Encounter (Signed)
  Oncology Nurse Navigator Documentation Referral date to RadOnc/MedOnc: 02/06/15 (02/06/15 1100) Navigator Encounter Type: Telephone (02/06/15 1100)     LVVM for patient indicating appts for next week:  Monday PAC placement, [facility], 1:30 arrival; Wed 1:15 Dr. Alvy Bimler; Friday infusion, time pending.  Requested return call to confirm message receipt.  Gayleen Orem, RN, BSN, [place] at [place] [phone]                     Time Spent with Patient: 15 (02/06/15 1100)

## 2015-02-06 NOTE — Telephone Encounter (Signed)
Called patient and he is aware of her appointment

## 2015-02-06 NOTE — Telephone Encounter (Signed)
  Oncology Nurse Navigator Documentation   Navigator Encounter Type: Telephone (02/06/15 1240)     Ron returned my call, confirmed his understanding of next week's appts.  Gayleen Orem, RN, BSN, [place] at [place] [phone]                     Time Spent with Patient: 15 (02/06/15 1240)

## 2015-02-09 ENCOUNTER — Ambulatory Visit (HOSPITAL_COMMUNITY)
Admission: RE | Admit: 2015-02-09 | Discharge: 2015-02-09 | Disposition: A | Discharge status: Home / Self Care | Payer: Medicaid Other | Source: Ambulatory Visit | Attending: Hematology and Oncology | Admitting: Hematology and Oncology

## 2015-02-09 ENCOUNTER — Encounter (HOSPITAL_COMMUNITY): Payer: Self-pay

## 2015-02-09 ENCOUNTER — Other Ambulatory Visit: Payer: Self-pay | Admitting: Hematology and Oncology

## 2015-02-09 ENCOUNTER — Telehealth: Payer: Self-pay | Admitting: *Deleted

## 2015-02-09 ENCOUNTER — Telehealth (HOSPITAL_COMMUNITY): Payer: Self-pay | Admitting: *Deleted

## 2015-02-09 ENCOUNTER — Encounter: Payer: Self-pay | Admitting: *Deleted

## 2015-02-09 DIAGNOSIS — M1990 Unspecified osteoarthritis, unspecified site: Secondary | ICD-10-CM | POA: Diagnosis not present

## 2015-02-09 DIAGNOSIS — D649 Anemia, unspecified: Secondary | ICD-10-CM | POA: Insufficient documentation

## 2015-02-09 DIAGNOSIS — R600 Localized edema: Secondary | ICD-10-CM | POA: Insufficient documentation

## 2015-02-09 DIAGNOSIS — Z8249 Family history of ischemic heart disease and other diseases of the circulatory system: Secondary | ICD-10-CM | POA: Insufficient documentation

## 2015-02-09 DIAGNOSIS — F419 Anxiety disorder, unspecified: Secondary | ICD-10-CM | POA: Diagnosis not present

## 2015-02-09 DIAGNOSIS — I10 Essential (primary) hypertension: Secondary | ICD-10-CM | POA: Diagnosis not present

## 2015-02-09 DIAGNOSIS — C099 Malignant neoplasm of tonsil, unspecified: Secondary | ICD-10-CM | POA: Diagnosis present

## 2015-02-09 DIAGNOSIS — Z87891 Personal history of nicotine dependence: Secondary | ICD-10-CM | POA: Diagnosis not present

## 2015-02-09 DIAGNOSIS — E876 Hypokalemia: Secondary | ICD-10-CM | POA: Insufficient documentation

## 2015-02-09 LAB — CBC WITH DIFFERENTIAL/PLATELET
BASOS ABS: 0 10*3/uL (ref 0.0–0.1)
Basophils Relative: 0 %
EOS ABS: 0 10*3/uL (ref 0.0–0.7)
EOS PCT: 1 %
HCT: 39.9 % (ref 39.0–52.0)
Hemoglobin: 13 g/dL (ref 13.0–17.0)
Lymphocytes Relative: 12 %
Lymphs Abs: 0.7 10*3/uL (ref 0.7–4.0)
MCH: 30.1 pg (ref 26.0–34.0)
MCHC: 32.6 g/dL (ref 30.0–36.0)
MCV: 92.4 fL (ref 78.0–100.0)
Monocytes Absolute: 0.4 10*3/uL (ref 0.1–1.0)
Monocytes Relative: 7 %
Neutro Abs: 4.7 10*3/uL (ref 1.7–7.7)
Neutrophils Relative %: 80 %
PLATELETS: 211 10*3/uL (ref 150–400)
RBC: 4.32 MIL/uL (ref 4.22–5.81)
RDW: 13.2 % (ref 11.5–15.5)
WBC: 5.9 10*3/uL (ref 4.0–10.5)

## 2015-02-09 LAB — PROTIME-INR
INR: 0.97 (ref 0.00–1.49)
PROTHROMBIN TIME: 13.1 s (ref 11.6–15.2)

## 2015-02-09 MED ORDER — SODIUM CHLORIDE 0.9 % IV SOLN
INTRAVENOUS | Status: DC
  Administered 2015-02-09: 14:00:00 via INTRAVENOUS

## 2015-02-09 MED ORDER — LIDOCAINE HCL 1 % IJ SOLN
INTRAMUSCULAR | Status: AC
  Filled 2015-02-09: qty 20

## 2015-02-09 MED ORDER — HEPARIN SOD (PORK) LOCK FLUSH 100 UNIT/ML IV SOLN
INTRAVENOUS | Status: AC | PRN
  Administered 2015-02-09: 500 [IU]

## 2015-02-09 MED ORDER — FENTANYL CITRATE (PF) 100 MCG/2ML IJ SOLN
INTRAMUSCULAR | Status: AC
  Filled 2015-02-09: qty 4

## 2015-02-09 MED ORDER — MIDAZOLAM HCL 2 MG/2ML IJ SOLN
INTRAMUSCULAR | Status: AC
  Filled 2015-02-09: qty 6

## 2015-02-09 MED ORDER — CEFAZOLIN SODIUM-DEXTROSE 2-3 GM-% IV SOLR
2.0000 g | Freq: Once | INTRAVENOUS | Status: AC
  Administered 2015-02-09: 2 g via INTRAVENOUS

## 2015-02-09 MED ORDER — LIDOCAINE-EPINEPHRINE 2 %-1:100000 IJ SOLN
INTRAMUSCULAR | Status: AC
  Filled 2015-02-09: qty 1

## 2015-02-09 MED ORDER — HEPARIN SOD (PORK) LOCK FLUSH 100 UNIT/ML IV SOLN
INTRAVENOUS | Status: AC
  Filled 2015-02-09: qty 5

## 2015-02-09 MED ORDER — CEFAZOLIN SODIUM-DEXTROSE 2-3 GM-% IV SOLR
INTRAVENOUS | Status: AC
  Administered 2015-02-09: 2 g via INTRAVENOUS
  Filled 2015-02-09: qty 50

## 2015-02-09 MED ORDER — MIDAZOLAM HCL 2 MG/2ML IJ SOLN
INTRAMUSCULAR | Status: AC | PRN
  Administered 2015-02-09 (×4): 1 mg via INTRAVENOUS

## 2015-02-09 MED ORDER — FENTANYL CITRATE (PF) 100 MCG/2ML IJ SOLN
INTRAMUSCULAR | Status: AC | PRN
  Administered 2015-02-09 (×2): 50 ug via INTRAVENOUS

## 2015-02-09 NOTE — Discharge Instructions (Signed)
Implanted Port Insertion, Care After °Refer to this sheet in the next few weeks. These instructions provide you with information on caring for yourself after your procedure. Your health care provider may also give you more specific instructions. Your treatment has been planned according to current medical practices, but problems sometimes occur. Call your health care provider if you have any problems or questions after your procedure. °WHAT TO EXPECT AFTER THE PROCEDURE °After your procedure, it is typical to have the following:  °· Discomfort at the port insertion site. Ice packs to the area will help. °· Bruising on the skin over the port. This will subside in 3-4 days. °HOME CARE INSTRUCTIONS °· After your port is placed, you will get a manufacturer's information card. The card has information about your port. Keep this card with you at all times.   °· Know what kind of port you have. There are many types of ports available.   °· Wear a medical alert bracelet in case of an emergency. This can help alert health care workers that you have a port.   °· The port can stay in for as long as your health care provider believes it is necessary.   °· A home health care nurse may give medicines and take care of the port.   °· You or a family member can get special training and directions for giving medicine and taking care of the port at home.   °SEEK MEDICAL CARE IF:  °· Your port does not flush or you are unable to get a blood return.   °· You have a fever or chills. °SEEK IMMEDIATE MEDICAL CARE IF: °· You have new fluid or pus coming from your incision.   °· You notice a bad smell coming from your incision site.   °· You have swelling, pain, or more redness at the incision or port site.   °· You have chest pain or shortness of breath. °  °This information is not intended to replace advice given to you by your health care provider. Make sure you discuss any questions you have with your health care provider. °  °Document  Released: 01/23/2013 Document Revised: 04/09/2013 Document Reviewed: 01/23/2013 °Elsevier Interactive Patient Education ©2016 [employer] °Implanted Port Home Guide °An implanted port is a type of central line that is placed under the skin. Central lines are used to provide IV access when treatment or nutrition needs to be given through a person's veins. Implanted ports are used for long-term IV access. An implanted port may be placed because:  °· You need IV medicine that would be irritating to the small veins in your hands or arms.   °· You need long-term IV medicines, such as antibiotics.   °· You need IV nutrition for a long period.   °· You need frequent blood draws for lab tests.   °· You need dialysis.   °Implanted ports are usually placed in the chest area, but they can also be placed in the upper arm, the abdomen, or the leg. An implanted port has two main parts:  °· Reservoir. The reservoir is round and will appear as a small, raised area under your skin. The reservoir is the part where a needle is inserted to give medicines or draw blood.   °· Catheter. The catheter is a thin, flexible tube that extends from the reservoir. The catheter is placed into a large vein. Medicine that is inserted into the reservoir goes into the catheter and then into the vein.   °HOW WILL I CARE FOR MY INCISION SITE? °Do not get the   incision site wet. Bathe or shower as directed by your health care provider.  °HOW IS MY PORT ACCESSED? °Special steps must be taken to access the port:  °· Before the port is accessed, a numbing cream can be placed on the skin. This helps numb the skin over the port site.   °· Your health care provider uses a sterile technique to access the port. °· Your health care provider must put on a mask and sterile gloves. °· The skin over your port is cleaned carefully with an antiseptic and allowed to dry. °· The port is gently pinched between sterile gloves, and a needle is inserted into the  port. °· Only "non-coring" port needles should be used to access the port. Once the port is accessed, a blood return should be checked. This helps ensure that the port is in the vein and is not clogged.   °· If your port needs to remain accessed for a constant infusion, a clear (transparent) bandage will be placed over the needle site. The bandage and needle will need to be changed every week, or as directed by your health care provider.   °· Keep the bandage covering the needle clean and dry. Do not get it wet. Follow your health care provider's instructions on how to take a shower or bath while the port is accessed.   °· If your port does not need to stay accessed, no bandage is needed over the port.   °WHAT IS FLUSHING? °Flushing helps keep the port from getting clogged. Follow your health care provider's instructions on how and when to flush the port. Ports are usually flushed with saline solution or a medicine called heparin. The need for flushing will depend on how the port is used.  °· If the port is used for intermittent medicines or blood draws, the port will need to be flushed:   °· After medicines have been given.   °· After blood has been drawn.   °· As part of routine maintenance.   °· If a constant infusion is running, the port may not need to be flushed.   °HOW LONG WILL MY PORT STAY IMPLANTED? °The port can stay in for as long as your health care provider thinks it is needed. When it is time for the port to come out, surgery will be done to remove it. The procedure is similar to the one performed when the port was put in.  °WHEN SHOULD I SEEK IMMEDIATE MEDICAL CARE? °When you have an implanted port, you should seek immediate medical care if:  °· You notice a bad smell coming from the incision site.   °· You have swelling, redness, or drainage at the incision site.   °· You have more swelling or pain at the port site or the surrounding area.   °· You have a fever that is not controlled with  medicine. °  °This information is not intended to replace advice given to you by your health care provider. Make sure you discuss any questions you have with your health care provider. °  °Document Released: 04/04/2005 Document Revised: 01/23/2013 Document Reviewed: 12/10/2012 °Elsevier Interactive Patient Education ©2016 [employer] ° ° °Moderate Conscious Sedation, Adult, Care After °Refer to this sheet in the next few weeks. These instructions provide you with information on caring for yourself after your procedure. Your health care provider may also give you more specific instructions. Your treatment has been planned according to current medical practices, but problems sometimes occur. Call your health care provider if you have any problems or questions   after your procedure. °WHAT TO EXPECT AFTER THE PROCEDURE  °After your procedure: °· You may feel sleepy, clumsy, and have poor balance for several hours. °· Vomiting may occur if you eat too soon after the procedure. °HOME CARE INSTRUCTIONS °· Do not participate in any activities where you could become injured for at least 24 hours. Do not: °¨ Drive. °¨ Swim. °¨ Ride a bicycle. °¨ Operate heavy machinery. °¨ Cook. °¨ Use power tools. °¨ Climb ladders. °¨ Work from a high place. °· Do not make important decisions or sign legal documents until you are improved. °· If you vomit, drink water, juice, or soup when you can drink without vomiting. Make sure you have little or no nausea before eating solid foods. °· Only take over-the-counter or prescription medicines for pain, discomfort, or fever as directed by your health care provider. °· Make sure you and your family fully understand everything about the medicines given to you, including what side effects may occur. °· You should not drink alcohol, take sleeping pills, or take medicines that cause drowsiness for at least 24 hours. °· If you smoke, do not smoke without supervision. °· If you are feeling better, you  may resume normal activities 24 hours after you were sedated. °· Keep all appointments with your health care provider. °SEEK MEDICAL CARE IF: °· Your skin is pale or bluish in color. °· You continue to feel nauseous or vomit. °· Your pain is getting worse and is not helped by medicine. °· You have bleeding or swelling. °· You are still sleepy or feeling clumsy after 24 hours. °SEEK IMMEDIATE MEDICAL CARE IF: °· You develop a rash. °· You have difficulty breathing. °· You develop any type of allergic problem. °· You have a fever. °MAKE SURE YOU: °· Understand these instructions. °· Will watch your condition. °· Will get help right away if you are not doing well or get worse. °  °This information is not intended to replace advice given to you by your health care provider. Make sure you discuss any questions you have with your health care provider. °  °Document Released: 01/23/2013 Document Revised: 04/25/2014 Document Reviewed: 01/23/2013 °Elsevier Interactive Patient Education ©2016 Elsevier Inc. ° °

## 2015-02-09 NOTE — H&P (Signed)
Chief Complaint: Patient was seen in consultation today for port a cath placement    Referring Physician(s): Gorsuch,Ni  History of Present Illness: Mason [name] is a 49 y.o. 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   Past Medical History  Diagnosis Date  . Knee pain, chronic   . Anxiety     mild new dx  . Arthritis     knees,hips  . Concussion     Hx: in high school x 2  . Hypertension   . PEG (percutaneous endoscopic gastrostomy) status ([place])   . Malnutrition ([place])   . Bilateral edema of lower extremity   . Abnormal liver function test   . Anemia   . Constipation   . Hypokalemia   . Fever   . Hypoglycemia   . Renal failure, acute ([place])   . Severe nausea and vomiting   . Hyperactive gag reflex   . Status post chemotherapy     Only received 2 doses due to uncontrolled nausea and acute renal failure  . S/P radiation therapy 08/19/2013-10/15/2013    Right Tonstil and bilateral neck / 70 Gy in 35 fractions to gross disease, 63 Gy in 35 fractions to high risk nodal echelons, and 56 Gy in 35 fractions to intermediate risk nodal echelons  . Non-healing surgical wound 05/23/2014  . Complication of anesthesia     Pt stated " my oxygen level was slow in rising."  . Tonsillar cancer ([place]) 07/09/13    SCCA of Right Tonsil, recurrent 2016    Past Surgical History  Procedure Laterality Date  . Multiple extractions with alveoloplasty N/A 08/01/2013    Procedure: Extraction of tooth #'s 1,15,17,31, 32 with alveoloplasty, mandibular left torus reduction, and gross debridement of remaining teeth.;  Surgeon: [name], DDS;  Location: [facility];  Service: Oral Surgery;  Laterality: N/A;  . Laparoscopic gastrostomy N/A 08/15/2013    Procedure: LAPAROSCOPIC GASTROSTOMY TUBE PLACEMENT;  Surgeon: [name], MD;  Location: [facility];  Service: General;  Laterality: N/A;  . Portacath  placement Left 08/15/2013    Procedure: INSERTION PORT-A-CATH;  Surgeon: [name], MD;  Location: [facility];  Service: General;  Laterality: Left;  . Lymph node biopsy  03/20/14    right neck  . Port-a-cath removal N/A 05/13/2014    Procedure: REMOVAL of PORT-A-CATH;  Surgeon: [name], MD;  Location: [facility];  Service: General;  Laterality: N/A;    Allergies: Pollen extract  Medications: Prior to Admission medications   Medication Sig Start Date End Date Taking? Authorizing Provider  acetaminophen (TYLENOL) 325 MG tablet Take 325-650 mg by mouth every 6 (six) hours as needed (pain).   Yes Historical Provider, MD  fluticasone (FLONASE) 50 MCG/ACT nasal spray Place 2 sprays into both nostrils daily. 07/25/14  Yes Lorayne Marek, MD  Multiple Vitamin (MULTIVITAMIN WITH MINERALS) TABS tablet Take 1 tablet by mouth daily.   Yes Historical Provider, MD  oxyCODONE-acetaminophen (PERCOCET/ROXICET) 5-325 MG tablet Take 1-2 tablets by mouth every 6 (six) hours as needed (pain).  01/26/15  Yes Historical Provider, MD  gabapentin (NEURONTIN) 300 MG capsule Take 1 capsule (300 mg total) by mouth 3 (three) times daily. Patient not taking: Reported on 02/09/2015 07/29/14   [name], MD  lidocaine-prilocaine (EMLA) cream Apply 1 application topically as needed. Patient not taking: Reported on 02/09/2015 12/02/14   [name], MD  morphine (MSIR)  15 MG tablet Take 1 tablet (15 mg total) by mouth every 6 (six) hours as needed for severe pain. Patient not taking: Reported on 02/09/2015 12/30/14   [name], MD     Family History  Problem Relation Age of Onset  . CVA    . Diabetes    . Heart attack    . Arthritis Father   . Arthritis Mother     Social History   Social History  . Marital Status: Single    Spouse Name: N/A  . Number of Children: 1  . Years of Education: N/A   Social History Main Topics  . Smoking status: Former Smoker -- 1.00 packs/day for 20 years    Types: Cigarettes     Quit date: 07/16/2003  . Smokeless tobacco: Never Used  . Alcohol Use: No     Comment: none years ago  . Drug Use: No     Comment: years ago  . Sexual Activity: No   Other Topics Concern  . None   Social History Narrative      Review of Systems  Constitutional: Negative for fever and chills.  HENT: Positive for sore throat and trouble swallowing.   Respiratory: Negative for cough and shortness of breath.   Cardiovascular: Negative for chest pain.  Gastrointestinal: Negative for nausea, vomiting, abdominal pain and blood in stool.  Genitourinary: Negative for dysuria and hematuria.  Musculoskeletal: Positive for back pain.  Neurological: Negative for headaches.    Vital Signs: BP 137/82 mmHg  Pulse 96  Temp(Src) 99 F (37.2 C) (Oral)  Resp 16  SpO2 95%  Physical Exam  Constitutional: He is oriented to person, place, and time. He appears well-developed and well-nourished.  Neck:  Rt neck with surgical scar , ST firmness, NT  Cardiovascular: Normal rate and regular rhythm.   Pulmonary/Chest: Effort normal.  Distant BS bilat  Abdominal: Soft. Bowel sounds are normal. There is no tenderness.  Musculoskeletal: Normal range of motion. He exhibits no edema.  Neurological: He is alert and oriented to person, place, and time.    Mallampati Score:     Imaging: No results found.  Labs:  CBC:  Recent Labs  05/12/14 1438 05/22/14 0846 09/30/14 1047 12/02/14 1347  WBC 3.6* 5.0 5.6 5.7  HGB 10.8* 10.8* 13.0 12.5*  HCT 33.8* 34.5* 40.1 38.6  PLT 206 219 191 205    COAGS: No results for input(s): INR, APTT in the last 8760 hours.  BMP:  Recent Labs  05/12/14 1438 05/22/14 0846 09/30/14 1047 12/02/14 1347  NA 140 142 140 141  K 4.2 4.2 4.4 4.6  CL 104  --   --   --   CO2 30 28 24 28   GLUCOSE 84 81 80 79  BUN 8 8.7 21.9 21.1  CALCIUM 9.3 8.8 9.0 9.2  CREATININE 1.31 1.4* 1.8* 1.8*  GFRNONAA 64*  --   --   --   GFRAA 74*  --   --   --     LIVER  FUNCTION TESTS:  Recent Labs  05/22/14 0846 09/30/14 1047 12/02/14 1347  BILITOT 0.36 0.70 0.44  AST 15 27 19   ALT 9 20 18   ALKPHOS 61 51 48  PROT 6.9 6.9 6.6  ALBUMIN 3.8 3.8 3.9    TUMOR MARKERS: No results for input(s): AFPTM, CEA, CA199, CHROMGRNA in the last 8760 hours.  Assessment and Plan: KACIN DANCY is a 49 y.o. male with history of recurrent squamous cell cancer of  the right tonsil who presents today for port a cath placement for chemotherapy.Risks and benefits discussed with the patient/wife including, but not limited to bleeding, infection, pneumothorax, or fibrin sheath development and need for additional procedures.All of the patient's questions were answered, patient is agreeable to proceed.Consent signed and in chart.     Thank you for this interesting consult.  I greatly enjoyed meeting AITAN ROSSBACH and look forward to participating in their care.  A copy of this report was sent to the requesting provider on this date.  Signed: D. Rowe Robert 02/09/2015, 2:10 PM   I spent a total of 15 minutes in face to face in clinical consultation, greater than 50% of which was counseling/coordinating care for port a cath placement

## 2015-02-09 NOTE — Procedures (Signed)
R EJ Port cathter placement with US and fluoroscopy No complication No blood loss. See complete dictation in [facility] PACS.  

## 2015-02-09 NOTE — Telephone Encounter (Signed)
  Oncology Nurse Navigator Documentation   Navigator Encounter Type: Telephone (02/09/15 0840)     LVMM reminding patient NPO after 0900 for his PAC placement this afternoon.  Asked him to call me to confirm message receipt.  Gayleen Orem, RN, BSN, [place] at [place] [phone]                     Time Spent with Patient: 15 (02/09/15 0840)

## 2015-02-09 NOTE — Progress Notes (Signed)
  Oncology Nurse Navigator Documentation     Patient Visit Type: Surgery (02/09/15 1410)     To provide support and encouragement, care continuity and to assess for needs, met with patient in [facility] where he had arrived for [facility] placement.  His wife was present. He recognized that he has been through this procedure before, did not express any concerns. He understands I will join him for his 1:15 appt with Dr. Alvy Bimler on Wed.  Gayleen Orem, RN, BSN, [place] [facility] [phone]                   Time Spent with Patient: 15 (02/09/15 1410)

## 2015-02-11 ENCOUNTER — Other Ambulatory Visit (HOSPITAL_BASED_OUTPATIENT_CLINIC_OR_DEPARTMENT_OTHER): Payer: Medicaid Other

## 2015-02-11 ENCOUNTER — Encounter: Payer: Self-pay | Admitting: *Deleted

## 2015-02-11 ENCOUNTER — Ambulatory Visit (HOSPITAL_BASED_OUTPATIENT_CLINIC_OR_DEPARTMENT_OTHER): Payer: Medicaid Other | Admitting: Hematology and Oncology

## 2015-02-11 ENCOUNTER — Telehealth: Payer: Self-pay | Admitting: Hematology and Oncology

## 2015-02-11 ENCOUNTER — Other Ambulatory Visit: Payer: Self-pay

## 2015-02-11 VITALS — BP 155/74 | HR 63 | Temp 99.0°F | Resp 18 | Ht 78.0 in | Wt 234.9 lb

## 2015-02-11 DIAGNOSIS — N182 Chronic kidney disease, stage 2 (mild): Secondary | ICD-10-CM | POA: Diagnosis not present

## 2015-02-11 DIAGNOSIS — G893 Neoplasm related pain (acute) (chronic): Secondary | ICD-10-CM

## 2015-02-11 DIAGNOSIS — C099 Malignant neoplasm of tonsil, unspecified: Secondary | ICD-10-CM

## 2015-02-11 LAB — CBC WITH DIFFERENTIAL/PLATELET
BASO%: 0.6 % (ref 0.0–2.0)
Basophils Absolute: 0 10*3/uL (ref 0.0–0.1)
EOS%: 1.1 % (ref 0.0–7.0)
Eosinophils Absolute: 0.1 10*3/uL (ref 0.0–0.5)
HCT: 39.1 % (ref 38.4–49.9)
HGB: 12.5 g/dL — ABNORMAL LOW (ref 13.0–17.1)
LYMPH%: 10.7 % — AB (ref 14.0–49.0)
MCH: 29.8 pg (ref 27.2–33.4)
MCHC: 32.1 g/dL (ref 32.0–36.0)
MCV: 92.8 fL (ref 79.3–98.0)
MONO#: 0.5 10*3/uL (ref 0.1–0.9)
MONO%: 8.9 % (ref 0.0–14.0)
NEUT%: 78.7 % — ABNORMAL HIGH (ref 39.0–75.0)
NEUTROS ABS: 4.3 10*3/uL (ref 1.5–6.5)
PLATELETS: 183 10*3/uL (ref 140–400)
RBC: 4.21 10*6/uL (ref 4.20–5.82)
RDW: 13.5 % (ref 11.0–14.6)
WBC: 5.5 10*3/uL (ref 4.0–10.3)
lymph#: 0.6 10*3/uL — ABNORMAL LOW (ref 0.9–3.3)

## 2015-02-11 LAB — COMPREHENSIVE METABOLIC PANEL (CC13)
ALT: 14 U/L (ref 0–55)
ANION GAP: 6 meq/L (ref 3–11)
AST: 22 U/L (ref 5–34)
Albumin: 4 g/dL (ref 3.5–5.0)
Alkaline Phosphatase: 58 U/L (ref 40–150)
BUN: 16.1 mg/dL (ref 7.0–26.0)
CHLORIDE: 107 meq/L (ref 98–109)
CO2: 27 meq/L (ref 22–29)
CREATININE: 1.6 mg/dL — AB (ref 0.7–1.3)
Calcium: 9.5 mg/dL (ref 8.4–10.4)
EGFR: 60 mL/min/{1.73_m2} — ABNORMAL LOW (ref >90)
Glucose: 83 mg/dl (ref 70–140)
Potassium: 4.3 mEq/L (ref 3.5–5.1)
SODIUM: 141 meq/L (ref 136–145)
TOTAL PROTEIN: 7 g/dL (ref 6.4–8.3)
Total Bilirubin: 0.31 mg/dL (ref 0.20–1.20)

## 2015-02-11 MED ORDER — HYDROMORPHONE HCL 4 MG PO TABS: 4.0000 mg | ORAL_TABLET | Freq: Four times a day (QID) | ORAL | Status: DC | PRN

## 2015-02-11 MED ORDER — PROCHLORPERAZINE MALEATE 10 MG PO TABS: 10.0000 mg | ORAL_TABLET | Freq: Four times a day (QID) | ORAL | Status: DC | PRN

## 2015-02-11 MED ORDER — ONDANSETRON HCL 8 MG PO TABS: 8.0000 mg | ORAL_TABLET | Freq: Three times a day (TID) | ORAL | Status: DC | PRN

## 2015-02-11 MED ORDER — LIDOCAINE-PRILOCAINE 2.5-2.5 % EX CREA: TOPICAL_CREAM | CUTANEOUS | Status: DC

## 2015-02-11 NOTE — Progress Notes (Signed)
  Oncology Nurse Navigator Documentation   Navigator Encounter Type: Clinic/MDC (02/11/15 1700) Patient Visit Type: Medonc (02/11/15 1700)     To provide support and encouragement, care continuity and to assess for needs,met with patient during appt with Dr. Alvy Bimler.  He was accompanied by his wife, Willette Cluster. They verbalized understanding of Dr. Calton Dach discussion of:  Chemo for purpose of shrinking tumor prior to surgery, her plan to administer weekly carbo/taxo.  Pain mgt with non-acetominophen meds to avoid masking of fever that might indicate infection. In response to expressed needs/concerns:  I obtained copies of Advance Directive forms for both patient and wife, provided Polo Riley' phone number for follow-up when they are ready to complete.  I informed Annamary Rummage of patient's financial concerns, relayed to him she will be contacting him to arrange appt to discuss. They understand I can be contacted as tmt plan proceeds.  Gayleen Orem, RN, BSN, [place] [facility] [place] [phone]                    Time Spent with Patient: 60 (02/11/15 1700)

## 2015-02-11 NOTE — Telephone Encounter (Signed)
Gave and printd appt sched and avs for pt for OCT and NOV °

## 2015-02-12 ENCOUNTER — Other Ambulatory Visit: Payer: Self-pay | Admitting: Hematology and Oncology

## 2015-02-12 DIAGNOSIS — G893 Neoplasm related pain (acute) (chronic): Secondary | ICD-10-CM | POA: Insufficient documentation

## 2015-02-12 NOTE — Assessment & Plan Note (Signed)
Unfortunately, he developed disease recurrence. Per recommendations from [facility], we will proceed with chemotherapy first to see if we can shrink the disease to render him a candidate for resection. According to the surgeon, the proximity to vasculature in his neck would prohibit clean resection. I discussed with him the risks, benefits, side effects of chemotherapy. The intention of chemotherapy would be to render him feasible for surgical resection. Surgery is the only way to give him a potential cure. If chemotherapy does not render him resectable, then the intent of the chemotherapy would be considered palliative

## 2015-02-12 NOTE — Assessment & Plan Note (Signed)
His kidney function is stable. As long as his creatinine clearance remain greater than 30, we will continue chemotherapy.

## 2015-02-12 NOTE — Assessment & Plan Note (Signed)
We discussed pain management. Percocet was not enough to control his pain. Also, I prefer non-Tylenol containing products. I gave him IR morphine but he felt nauseated with morphine. I recommend a trial of Dilaudid instead. I will assess pain control next week.

## 2015-02-12 NOTE — Progress Notes (Signed)
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 FNA of right level III lymph node was inconclusive for cancer   07/25/2013 Imaging PET scan showed locally advanced disease with abnormal lymphadenopathy in the right axilla   08/06/2013 Procedure CT-guided biopsy of the lymphadenopathy was negative for malignancy   08/15/2013 Surgery Patient has placement of port and feeding tube   08/19/2013 - 09/10/2013 Chemotherapy Patient received chemotherapy with cisplatin. The patient only received 2 doses due to uncontrolled nausea and acute renal failure.   08/19/2013 - 10/15/2013 Radiation Therapy Patient received radiation treatment   08/27/2013 - 08/30/2013 Hospital Admission The patient was admitted to the hospital for uncontrolled nausea vomiting and dehydration.   02/14/2014 Imaging PET/CT scan showed complete response to treatment   03/19/2014 Surgery He had excisional lymph node biopsy from the right neck. Pathology was benign   05/13/2014 Surgery He had removal of Port-A-Cath.   05/22/2014 Imaging Repeat CT scan of the neck show no evidence of disease recurrence.   12/09/2014 Imaging Ct neck without contrast showed persistent abnormalities on the right side of neck, indeterminate   12/25/2014 Imaging PET CT scan  showed disease recurrence.   02/10/2015 Procedure He has placement of port    INTERVAL HISTORY: Please see below for problem oriented charting. He returns for further follow-up along with his wife. He complained of persistent pain in the neck. The immediate release morphine that was prescribed caused some nausea. He denies swallowing difficulties.  REVIEW OF SYSTEMS:   Constitutional: Denies fevers, chills or abnormal weight loss Eyes: Denies blurriness of vision Ears, nose, mouth, throat, and face: Denies mucositis or sore throat Respiratory: Denies cough, dyspnea or wheezes Cardiovascular: Denies palpitation, chest discomfort or lower extremity swelling Gastrointestinal:  Denies nausea, heartburn or change in bowel habits Skin: Denies abnormal skin rashes Lymphatics: Denies new lymphadenopathy or easy bruising Neurological:Denies numbness, tingling or new weaknesses Behavioral/Psych: Mood is stable, no new changes  All other systems were reviewed with the patient and are negative.  I have reviewed the past medical history, past surgical history, social history and family history with the patient and they are unchanged from previous note.  ALLERGIES:  is allergic to pollen extract.  MEDICATIONS:  Current Outpatient Prescriptions  Medication Sig Dispense Refill  . acetaminophen (TYLENOL) 325 MG tablet Take 325-650 mg by mouth every 6 (six) hours as needed (pain).    . fluticasone (FLONASE) 50 MCG/ACT nasal spray Place 2 sprays into both nostrils daily. 16 g 2  . gabapentin (NEURONTIN) 300 MG capsule Take 1 capsule (300 mg total) by mouth 3 (three) times daily. 90 capsule 6  . lidocaine-prilocaine (EMLA) cream Apply 1 application topically as needed. 30 g 6  . morphine (MSIR) 15 MG tablet Take 1 tablet (15 mg total) by mouth every 6 (  six) hours as needed for severe pain. 90 tablet 0  . Multiple Vitamin (MULTIVITAMIN WITH MINERALS) TABS tablet Take 1 tablet by mouth daily.    Marland Kitchen  oxyCODONE-acetaminophen (PERCOCET/ROXICET) 5-325 MG tablet Take 1-2 tablets by mouth every 6 (six) hours as needed (pain).     Marland Kitchen HYDROmorphone (DILAUDID) 4 MG tablet Take 1 tablet (4 mg total) by mouth every 6 (six) hours as needed for severe pain. 60 tablet 0  . lidocaine-prilocaine (EMLA) cream Apply to affected area once 30 g 3  . ondansetron (ZOFRAN) 8 MG tablet Take 1 tablet (8 mg total) by mouth every 8 (eight) hours as needed for nausea or vomiting. 30 tablet 1  . prochlorperazine (COMPAZINE) 10 MG tablet Take 1 tablet (10 mg total) by mouth every 6 (six) hours as needed (Nausea or vomiting). 30 tablet 1   No current facility-administered medications for this visit.    PHYSICAL EXAMINATION: ECOG PERFORMANCE STATUS: 0 - Asymptomatic  Filed Vitals:   02/11/15 1304  BP: 155/74  Pulse: 63  Temp: 99 F (37.2 C)  Resp: 18   Filed Weights   02/11/15 1304  Weight: 234 lb 14.4 oz (106.55 kg)    GENERAL:alert, no distress and comfortable SKIN: skin color, texture, turgor are normal, no rashes or significant lesions EYES: normal, Conjunctiva are pink and non-injected, sclera clear OROPHARYNX:no exudate, no erythema and lips, buccal mucosa, and tongue normal  NECK: Persistent swelling in the angle of the jaw.  LYMPH:  Palpable lymphadenopathy in the angle of the jaw. LUNGS: clear to auscultation and percussion with normal breathing effort HEART: regular rate & rhythm and no murmurs and no lower extremity edema ABDOMEN:abdomen soft, non-tender and normal bowel sounds Musculoskeletal:no cyanosis of digits and no clubbing  NEURO: alert & oriented x 3 with fluent speech, no focal motor/sensory deficits  LABORATORY DATA:  I have reviewed the data as listed    Component Value Date/Time   NA 141 02/11/2015 1417   NA 140 05/12/2014 1438   K 4.3 02/11/2015 1417   K 4.2 05/12/2014 1438   CL 104 05/12/2014 1438   CO2 27 02/11/2015 1417   CO2 30 05/12/2014 1438   GLUCOSE 83 02/11/2015  1417   GLUCOSE 84 05/12/2014 1438   BUN 16.1 02/11/2015 1417   BUN 8 05/12/2014 1438   CREATININE 1.6* 02/11/2015 1417   CREATININE 1.31 05/12/2014 1438   CALCIUM 9.5 02/11/2015 1417   CALCIUM 9.3 05/12/2014 1438   PROT 7.0 02/11/2015 1417   PROT 7.5 09/30/2013 0522   ALBUMIN 4.0 02/11/2015 1417   ALBUMIN 3.4* 09/30/2013 0522   AST 22 02/11/2015 1417   AST 24 09/30/2013 0522   ALT 14 02/11/2015 1417   ALT 43 09/30/2013 0522   ALKPHOS 58 02/11/2015 1417   ALKPHOS 101 09/30/2013 0522   BILITOT 0.31 02/11/2015 1417   BILITOT 0.3 09/30/2013 0522   GFRNONAA 64* 05/12/2014 1438   GFRAA 74* 05/12/2014 1438    No results found for: SPEP, UPEP  Lab Results  Component Value Date   WBC 5.5 02/11/2015   NEUTROABS 4.3 02/11/2015   HGB 12.5* 02/11/2015   HCT 39.1 02/11/2015   MCV 92.8 02/11/2015   PLT 183 02/11/2015      Chemistry      Component Value Date/Time   NA 141 02/11/2015 1417   NA 140 05/12/2014 1438   K 4.3 02/11/2015 1417   K 4.2 05/12/2014 1438   CL 104 05/12/2014 1438   CO2  27 02/11/2015 1417   CO2 30 05/12/2014 1438   BUN 16.1 02/11/2015 1417   BUN 8 05/12/2014 1438   CREATININE 1.6* 02/11/2015 1417   CREATININE 1.31 05/12/2014 1438      Component Value Date/Time   CALCIUM 9.5 02/11/2015 1417   CALCIUM 9.3 05/12/2014 1438   ALKPHOS 58 02/11/2015 1417   ALKPHOS 101 09/30/2013 0522   AST 22 02/11/2015 1417   AST 24 09/30/2013 0522   ALT 14 02/11/2015 1417   ALT 43 09/30/2013 0522   BILITOT 0.31 02/11/2015 1417   BILITOT 0.3 09/30/2013 0522       ASSESSMENT & PLAN:  Tonsillar cancer Unfortunately, he developed disease recurrence. Per recommendations from [facility], we will proceed with chemotherapy first to see if we can shrink the disease to render him a candidate for resection. According to the surgeon, the proximity to vasculature in his neck would prohibit clean resection. I discussed with him the risks, benefits, side effects of  chemotherapy. The intention of chemotherapy would be to render him feasible for surgical resection. Surgery is the only way to give him a potential cure. If chemotherapy does not render him resectable, then the intent of the chemotherapy would be considered palliative  Chronic kidney disease (CKD), stage II (mild) His kidney function is stable. As long as his creatinine clearance remain greater than 30, we will continue chemotherapy.  Cancer associated pain We discussed pain management. Percocet was not enough to control his pain. Also, I prefer non-Tylenol containing products. I gave him IR morphine but he felt nauseated with morphine. I recommend a trial of Dilaudid instead. I will assess pain control next week.   No orders of the defined types were placed in this encounter.   All questions were answered. The patient knows to call the clinic with any problems, questions or concerns. No barriers to learning was detected. I spent 30 minutes counseling the patient face to face. The total time spent in the appointment was 40 minutes and more than 50% was on counseling and review of test results     [facility], [place], MD 02/12/2015 7:21 AM

## 2015-02-13 ENCOUNTER — Telehealth: Payer: Self-pay | Admitting: *Deleted

## 2015-02-13 ENCOUNTER — Ambulatory Visit (HOSPITAL_BASED_OUTPATIENT_CLINIC_OR_DEPARTMENT_OTHER): Payer: Medicaid Other

## 2015-02-13 ENCOUNTER — Encounter: Payer: Self-pay | Admitting: *Deleted

## 2015-02-13 VITALS — BP 120/67 | HR 68 | Temp 98.3°F | Resp 16

## 2015-02-13 DIAGNOSIS — C099 Malignant neoplasm of tonsil, unspecified: Secondary | ICD-10-CM | POA: Diagnosis not present

## 2015-02-13 DIAGNOSIS — Z5111 Encounter for antineoplastic chemotherapy: Secondary | ICD-10-CM | POA: Diagnosis present

## 2015-02-13 MED ORDER — DIPHENHYDRAMINE HCL 50 MG/ML IJ SOLN
50.0000 mg | Freq: Once | INTRAMUSCULAR | Status: AC
  Administered 2015-02-13: 50 mg via INTRAVENOUS

## 2015-02-13 MED ORDER — SODIUM CHLORIDE 0.9 % IV SOLN
Freq: Once | INTRAVENOUS | Status: AC
  Administered 2015-02-13: 10:00:00 via INTRAVENOUS

## 2015-02-13 MED ORDER — HEPARIN SOD (PORK) LOCK FLUSH 100 UNIT/ML IV SOLN
500.0000 [IU] | Freq: Once | INTRAVENOUS | Status: AC | PRN
  Administered 2015-02-13: 500 [IU]
  Filled 2015-02-13: qty 5

## 2015-02-13 MED ORDER — FAMOTIDINE IN NACL 20-0.9 MG/50ML-% IV SOLN
20.0000 mg | Freq: Once | INTRAVENOUS | Status: AC
  Administered 2015-02-13: 20 mg via INTRAVENOUS

## 2015-02-13 MED ORDER — PACLITAXEL CHEMO INJECTION 300 MG/50ML
45.0000 mg/m2 | Freq: Once | INTRAVENOUS | Status: AC
  Administered 2015-02-13: 108 mg via INTRAVENOUS
  Filled 2015-02-13: qty 18

## 2015-02-13 MED ORDER — SODIUM CHLORIDE 0.9 % IV SOLN
218.0000 mg | Freq: Once | INTRAVENOUS | Status: AC
  Administered 2015-02-13: 220 mg via INTRAVENOUS
  Filled 2015-02-13: qty 22

## 2015-02-13 MED ORDER — SODIUM CHLORIDE 0.9 % IJ SOLN
10.0000 mL | INTRAMUSCULAR | Status: DC | PRN
  Administered 2015-02-13: 10 mL
  Filled 2015-02-13: qty 10

## 2015-02-13 MED ORDER — DIPHENHYDRAMINE HCL 50 MG/ML IJ SOLN
INTRAMUSCULAR | Status: AC
  Filled 2015-02-13: qty 1

## 2015-02-13 MED ORDER — SODIUM CHLORIDE 0.9 % IV SOLN
Freq: Once | INTRAVENOUS | Status: AC
  Administered 2015-02-13: 10:00:00 via INTRAVENOUS
  Filled 2015-02-13: qty 8

## 2015-02-13 MED ORDER — FAMOTIDINE IN NACL 20-0.9 MG/50ML-% IV SOLN
INTRAVENOUS | Status: AC
  Filled 2015-02-13: qty 50

## 2015-02-13 NOTE — Progress Notes (Signed)
  Oncology Nurse Navigator Documentation   Navigator Encounter Type: Treatment (02/13/15 1045) Patient Visit Type: Medonc (02/13/15 1045) Treatment Phase: First Chemo Tx (02/13/15 1045)     To provide support and encouragement, care continuity and to assess for needs, briefly met with patient in Infusion where he was receiving his first chemo tmt. He indicated "doing OK". I provided him the November calendar for [facility], noting specifically the [facility] that my be of interest to his wife.  Gayleen Orem, RN, BSN, [place] [facility] [place] [phone]                 Time Spent with Patient: 15 (02/13/15 1045)

## 2015-02-13 NOTE — Patient Instructions (Addendum)
[facility] Discharge Instructions for Patients Receiving Chemotherapy  Today you received the following chemotherapy agents taxol/carboplatin  To help prevent nausea and vomiting after your treatment, we encourage you to take your nausea medication as directed   If you develop nausea and vomiting that is not controlled by your nausea medication, call the clinic.   BELOW ARE SYMPTOMS THAT SHOULD BE REPORTED IMMEDIATELY:  *FEVER GREATER THAN 100.5 F  *CHILLS WITH OR WITHOUT FEVER  NAUSEA AND VOMITING THAT IS NOT CONTROLLED WITH YOUR NAUSEA MEDICATION  *UNUSUAL SHORTNESS OF BREATH  *UNUSUAL BRUISING OR BLEEDING  TENDERNESS IN MOUTH AND THROAT WITH OR WITHOUT PRESENCE OF ULCERS  *URINARY PROBLEMS  *BOWEL PROBLEMS  UNUSUAL RASH Items with * indicate a potential emergency and should be followed up as soon as possible.  Feel free to call the clinic you have any questions or concerns. The clinic phone number is [phone].  Paclitaxel injection What is this medicine? PACLITAXEL (PAK li TAX el) is a chemotherapy drug. It targets fast dividing cells, like cancer cells, and causes these cells to die. This medicine is used to treat ovarian cancer, breast cancer, and other cancers. This medicine may be used for other purposes; ask your health care provider or pharmacist if you have questions. What should I tell my health care provider before I take this medicine? They need to know if you have any of these conditions: -blood disorders -irregular heartbeat -infection (especially a virus infection such as chickenpox, cold sores, or herpes) -liver disease -previous or ongoing radiation therapy -an unusual or allergic reaction to paclitaxel, alcohol, polyoxyethylated castor oil, other chemotherapy agents, other medicines, foods, dyes, or preservatives -pregnant or trying to get pregnant -breast-feeding How should I use this medicine? This drug is given as an infusion  into a vein. It is administered in a hospital or clinic by a specially trained health care professional. Talk to your pediatrician regarding the use of this medicine in children. Special care may be needed. Overdosage: If you think you have taken too much of this medicine contact a poison control center or emergency room at once. NOTE: This medicine is only for you. Do not share this medicine with others. What if I miss a dose? It is important not to miss your dose. Call your doctor or health care professional if you are unable to keep an appointment. What may interact with this medicine? Do not take this medicine with any of the following medications: -disulfiram -metronidazole This medicine may also interact with the following medications: -cyclosporine -diazepam -ketoconazole -medicines to increase blood counts like filgrastim, pegfilgrastim, sargramostim -other chemotherapy drugs like cisplatin, doxorubicin, epirubicin, etoposide, teniposide, vincristine -quinidine -testosterone -vaccines -verapamil Talk to your doctor or health care professional before taking any of these medicines: -acetaminophen -aspirin -ibuprofen -ketoprofen -naproxen This list may not describe all possible interactions. Give your health care provider a list of all the medicines, herbs, non-prescription drugs, or dietary supplements you use. Also tell them if you smoke, drink alcohol, or use illegal drugs. Some items may interact with your medicine. What should I watch for while using this medicine? Your condition will be monitored carefully while you are receiving this medicine. You will need important blood work done while you are taking this medicine. This drug may make you feel generally unwell. This is not uncommon, as chemotherapy can affect healthy cells as well as cancer cells. Report any side effects. Continue your course of treatment even though you feel ill unless   your doctor tells you to stop. This  medicine can cause serious allergic reactions. To reduce your risk you will need to take other medicine(s) before treatment with this medicine. In some cases, you may be given additional medicines to help with side effects. Follow all directions for their use. Call your doctor or health care professional for advice if you get a fever, chills or sore throat, or other symptoms of a cold or flu. Do not treat yourself. This drug decreases your body's ability to fight infections. Try to avoid being around people who are sick. This medicine may increase your risk to bruise or bleed. Call your doctor or health care professional if you notice any unusual bleeding. Be careful brushing and flossing your teeth or using a toothpick because you may get an infection or bleed more easily. If you have any dental work done, tell your dentist you are receiving this medicine. Avoid taking products that contain aspirin, acetaminophen, ibuprofen, naproxen, or ketoprofen unless instructed by your doctor. These medicines may hide a fever. Do not become pregnant while taking this medicine. Women should inform their doctor if they wish to become pregnant or think they might be pregnant. There is a potential for serious side effects to an unborn child. Talk to your health care professional or pharmacist for more information. Do not breast-feed an infant while taking this medicine. Men are advised not to father a child while receiving this medicine. This product may contain alcohol. Ask your pharmacist or healthcare provider if this medicine contains alcohol. Be sure to tell all healthcare providers you are taking this medicine. Certain medicines, like metronidazole and disulfiram, can cause an unpleasant reaction when taken with alcohol. The reaction includes flushing, headache, nausea, vomiting, sweating, and increased thirst. The reaction can last from 30 minutes to several hours. What side effects may I notice from receiving this  medicine? Side effects that you should report to your doctor or health care professional as soon as possible: -allergic reactions like skin rash, itching or hives, swelling of the face, lips, or tongue -low blood counts - This drug may decrease the number of white blood cells, red blood cells and platelets. You may be at increased risk for infections and bleeding. -signs of infection - fever or chills, cough, sore throat, pain or difficulty passing urine -signs of decreased platelets or bleeding - bruising, pinpoint red spots on the skin, black, tarry stools, nosebleeds -signs of decreased red blood cells - unusually weak or tired, fainting spells, lightheadedness -breathing problems -chest pain -high or low blood pressure -mouth sores -nausea and vomiting -pain, swelling, redness or irritation at the injection site -pain, tingling, numbness in the hands or feet -slow or irregular heartbeat -swelling of the ankle, feet, hands Side effects that usually do not require medical attention (report to your doctor or health care professional if they continue or are bothersome): -bone pain -complete hair loss including hair on your head, underarms, pubic hair, eyebrows, and eyelashes -changes in the color of fingernails -diarrhea -loosening of the fingernails -loss of appetite -muscle or joint pain -red flush to skin -sweating This list may not describe all possible side effects. Call your doctor for medical advice about side effects. You may report side effects to FDA at 1-800-FDA-1088. Where should I keep my medicine? This drug is given in a hospital or clinic and will not be stored at home. NOTE: This sheet is a summary. It may not cover all possible information. If you have questions   about this medicine, talk to your doctor, pharmacist, or health care provider.    2016, Elsevier/Gold Standard. (2014-11-20 13:02:56)  Carboplatin injection What is this medicine? CARBOPLATIN (KAR boe pla  tin) is a chemotherapy drug. It targets fast dividing cells, like cancer cells, and causes these cells to die. This medicine is used to treat ovarian cancer and many other cancers. This medicine may be used for other purposes; ask your health care provider or pharmacist if you have questions. What should I tell my health care provider before I take this medicine? They need to know if you have any of these conditions: -blood disorders -hearing problems -kidney disease -recent or ongoing radiation therapy -an unusual or allergic reaction to carboplatin, cisplatin, other chemotherapy, other medicines, foods, dyes, or preservatives -pregnant or trying to get pregnant -breast-feeding How should I use this medicine? This drug is usually given as an infusion into a vein. It is administered in a hospital or clinic by a specially trained health care professional. Talk to your pediatrician regarding the use of this medicine in children. Special care may be needed. Overdosage: If you think you have taken too much of this medicine contact a poison control center or emergency room at once. NOTE: This medicine is only for you. Do not share this medicine with others. What if I miss a dose? It is important not to miss a dose. Call your doctor or health care professional if you are unable to keep an appointment. What may interact with this medicine? -medicines for seizures -medicines to increase blood counts like filgrastim, pegfilgrastim, sargramostim -some antibiotics like amikacin, gentamicin, neomycin, streptomycin, tobramycin -vaccines Talk to your doctor or health care professional before taking any of these medicines: -acetaminophen -aspirin -ibuprofen -ketoprofen -naproxen This list may not describe all possible interactions. Give your health care provider a list of all the medicines, herbs, non-prescription drugs, or dietary supplements you use. Also tell them if you smoke, drink alcohol, or use  illegal drugs. Some items may interact with your medicine. What should I watch for while using this medicine? Your condition will be monitored carefully while you are receiving this medicine. You will need important blood work done while you are taking this medicine. This drug may make you feel generally unwell. This is not uncommon, as chemotherapy can affect healthy cells as well as cancer cells. Report any side effects. Continue your course of treatment even though you feel ill unless your doctor tells you to stop. In some cases, you may be given additional medicines to help with side effects. Follow all directions for their use. Call your doctor or health care professional for advice if you get a fever, chills or sore throat, or other symptoms of a cold or flu. Do not treat yourself. This drug decreases your body's ability to fight infections. Try to avoid being around people who are sick. This medicine may increase your risk to bruise or bleed. Call your doctor or health care professional if you notice any unusual bleeding. Be careful brushing and flossing your teeth or using a toothpick because you may get an infection or bleed more easily. If you have any dental work done, tell your dentist you are receiving this medicine. Avoid taking products that contain aspirin, acetaminophen, ibuprofen, naproxen, or ketoprofen unless instructed by your doctor. These medicines may hide a fever. Do not become pregnant while taking this medicine. Women should inform their doctor if they wish to become pregnant or think they might be pregnant. There   is a potential for serious side effects to an unborn child. Talk to your health care professional or pharmacist for more information. Do not breast-feed an infant while taking this medicine. What side effects may I notice from receiving this medicine? Side effects that you should report to your doctor or health care professional as soon as possible: -allergic  reactions like skin rash, itching or hives, swelling of the face, lips, or tongue -signs of infection - fever or chills, cough, sore throat, pain or difficulty passing urine -signs of decreased platelets or bleeding - bruising, pinpoint red spots on the skin, black, tarry stools, nosebleeds -signs of decreased red blood cells - unusually weak or tired, fainting spells, lightheadedness -breathing problems -changes in hearing -changes in vision -chest pain -high blood pressure -low blood counts - This drug may decrease the number of white blood cells, red blood cells and platelets. You may be at increased risk for infections and bleeding. -nausea and vomiting -pain, swelling, redness or irritation at the injection site -pain, tingling, numbness in the hands or feet -problems with balance, talking, walking -trouble passing urine or change in the amount of urine Side effects that usually do not require medical attention (report to your doctor or health care professional if they continue or are bothersome): -hair loss -loss of appetite -metallic taste in the mouth or changes in taste This list may not describe all possible side effects. Call your doctor for medical advice about side effects. You may report side effects to FDA at 1-800-FDA-1088. Where should I keep my medicine? This drug is given in a hospital or clinic and will not be stored at home. NOTE: This sheet is a summary. It may not cover all possible information. If you have questions about this medicine, talk to your doctor, pharmacist, or health care provider.    2016, Elsevier/Gold Standard. (2007-07-10 14:38:05)  

## 2015-02-13 NOTE — Telephone Encounter (Signed)
Pt states he feels "real drunk" on MS and asking if Dr. Alvy Bimler would be willing to prescribed "plain" Oxycodone 10 mg tablets for his pain?  States he didn't feel as drunk on the oxycodones.  He understanding Dr. Alvy Bimler out of office today.  Will ask her on Monday and call him back.  He will take the MS this weekend and just "lay low" with the side effects.

## 2015-02-16 ENCOUNTER — Telehealth: Payer: Self-pay | Admitting: *Deleted

## 2015-02-16 NOTE — Telephone Encounter (Signed)
Tammi, tell him to cut his pain medications by half for now and I will reassess on Friday

## 2015-02-16 NOTE — Telephone Encounter (Signed)
Mason Lark, MD at 02/16/2015 7:58 AM     Status: Signed       Expand All Collapse All   Mason [name], tell him to cut his pain medications by half for now and I will reassess on Friday            Phonacelle C Windham, RN at 02/13/2015 12:55 PM     Status: Signed       Expand All Collapse All   Pt states he feels "real drunk" on MS and asking if Dr. Alvy Bimler would be willing to prescribed "plain" Oxycodone 10 mg tablets for his pain? States he didn't feel as drunk on the oxycodones. He understanding Dr. Alvy Bimler out of office today. Will ask her on Monday and call him back. He will take the MS this weekend and just "lay low" with the side effects.          Pt notified of message above

## 2015-02-17 ENCOUNTER — Telehealth: Payer: Self-pay | Admitting: *Deleted

## 2015-02-17 NOTE — Telephone Encounter (Signed)
Left message to call if having any problems from chemo.

## 2015-02-17 NOTE — Telephone Encounter (Signed)
-----   Message from Arty Baumgartner, RN sent at 02/13/2015 12:26 PM EDT ----- Regarding: First time chemo NG First time taxol/carbo.  Dr. Alvy Bimler.  [phone]

## 2015-02-20 ENCOUNTER — Telehealth: Payer: Self-pay | Admitting: Hematology and Oncology

## 2015-02-20 ENCOUNTER — Other Ambulatory Visit (HOSPITAL_BASED_OUTPATIENT_CLINIC_OR_DEPARTMENT_OTHER): Payer: Medicaid Other

## 2015-02-20 ENCOUNTER — Ambulatory Visit (HOSPITAL_BASED_OUTPATIENT_CLINIC_OR_DEPARTMENT_OTHER): Payer: Medicaid Other | Admitting: Hematology and Oncology

## 2015-02-20 ENCOUNTER — Ambulatory Visit: Payer: Medicaid Other | Admitting: Nutrition

## 2015-02-20 ENCOUNTER — Other Ambulatory Visit: Payer: Self-pay

## 2015-02-20 ENCOUNTER — Ambulatory Visit: Payer: Medicaid Other

## 2015-02-20 ENCOUNTER — Telehealth: Payer: Self-pay | Admitting: *Deleted

## 2015-02-20 ENCOUNTER — Ambulatory Visit (HOSPITAL_BASED_OUTPATIENT_CLINIC_OR_DEPARTMENT_OTHER): Payer: Medicaid Other

## 2015-02-20 ENCOUNTER — Other Ambulatory Visit: Payer: Self-pay | Admitting: Hematology and Oncology

## 2015-02-20 VITALS — BP 135/84 | HR 63 | Temp 98.3°F | Resp 18 | Ht 78.0 in | Wt 231.1 lb

## 2015-02-20 DIAGNOSIS — Z5111 Encounter for antineoplastic chemotherapy: Secondary | ICD-10-CM | POA: Diagnosis present

## 2015-02-20 DIAGNOSIS — C099 Malignant neoplasm of tonsil, unspecified: Secondary | ICD-10-CM | POA: Diagnosis not present

## 2015-02-20 DIAGNOSIS — D630 Anemia in neoplastic disease: Secondary | ICD-10-CM | POA: Diagnosis not present

## 2015-02-20 DIAGNOSIS — G893 Neoplasm related pain (acute) (chronic): Secondary | ICD-10-CM | POA: Diagnosis not present

## 2015-02-20 DIAGNOSIS — N182 Chronic kidney disease, stage 2 (mild): Secondary | ICD-10-CM

## 2015-02-20 DIAGNOSIS — Z95828 Presence of other vascular implants and grafts: Secondary | ICD-10-CM

## 2015-02-20 LAB — CBC WITH DIFFERENTIAL/PLATELET
BASO%: 0.7 % (ref 0.0–2.0)
Basophils Absolute: 0 10*3/uL (ref 0.0–0.1)
EOS%: 2.2 % (ref 0.0–7.0)
Eosinophils Absolute: 0.1 10*3/uL (ref 0.0–0.5)
HEMATOCRIT: 39.2 % (ref 38.4–49.9)
HEMOGLOBIN: 12.7 g/dL — AB (ref 13.0–17.1)
LYMPH%: 12.8 % — ABNORMAL LOW (ref 14.0–49.0)
MCH: 29.7 pg (ref 27.2–33.4)
MCHC: 32.4 g/dL (ref 32.0–36.0)
MCV: 91.6 fL (ref 79.3–98.0)
MONO#: 0.3 10*3/uL (ref 0.1–0.9)
MONO%: 6.2 % (ref 0.0–14.0)
NEUT#: 3.5 10*3/uL (ref 1.5–6.5)
NEUT%: 78.1 % — AB (ref 39.0–75.0)
PLATELETS: 216 10*3/uL (ref 140–400)
RBC: 4.28 10*6/uL (ref 4.20–5.82)
RDW: 13.9 % (ref 11.0–14.6)
WBC: 4.5 10*3/uL (ref 4.0–10.3)
lymph#: 0.6 10*3/uL — ABNORMAL LOW (ref 0.9–3.3)

## 2015-02-20 LAB — COMPREHENSIVE METABOLIC PANEL (CC13)
ALBUMIN: 4 g/dL (ref 3.5–5.0)
ALK PHOS: 48 U/L (ref 40–150)
ALT: 16 U/L (ref 0–55)
ANION GAP: 6 meq/L (ref 3–11)
AST: 22 U/L (ref 5–34)
BILIRUBIN TOTAL: 0.64 mg/dL (ref 0.20–1.20)
BUN: 22.8 mg/dL (ref 7.0–26.0)
CALCIUM: 9.6 mg/dL (ref 8.4–10.4)
CHLORIDE: 107 meq/L (ref 98–109)
CO2: 26 mEq/L (ref 22–29)
CREATININE: 1.7 mg/dL — AB (ref 0.7–1.3)
EGFR: 55 mL/min/{1.73_m2} — ABNORMAL LOW (ref >90)
Glucose: 84 mg/dl (ref 70–140)
Potassium: 4.6 mEq/L (ref 3.5–5.1)
Sodium: 140 mEq/L (ref 136–145)
TOTAL PROTEIN: 7 g/dL (ref 6.4–8.3)

## 2015-02-20 MED ORDER — FAMOTIDINE IN NACL 20-0.9 MG/50ML-% IV SOLN
20.0000 mg | Freq: Once | INTRAVENOUS | Status: AC
  Administered 2015-02-20: 20 mg via INTRAVENOUS

## 2015-02-20 MED ORDER — HEPARIN SOD (PORK) LOCK FLUSH 100 UNIT/ML IV SOLN
500.0000 [IU] | Freq: Once | INTRAVENOUS | Status: AC | PRN
  Administered 2015-02-20: 500 [IU]
  Filled 2015-02-20: qty 5

## 2015-02-20 MED ORDER — DIPHENHYDRAMINE HCL 50 MG/ML IJ SOLN
50.0000 mg | Freq: Once | INTRAMUSCULAR | Status: AC
  Administered 2015-02-20: 50 mg via INTRAVENOUS

## 2015-02-20 MED ORDER — SODIUM CHLORIDE 0.9 % IJ SOLN
10.0000 mL | INTRAMUSCULAR | Status: DC | PRN
  Administered 2015-02-20: 10 mL via INTRAVENOUS
  Filled 2015-02-20: qty 10

## 2015-02-20 MED ORDER — PACLITAXEL CHEMO INJECTION 300 MG/50ML
45.0000 mg/m2 | Freq: Once | INTRAVENOUS | Status: AC
  Administered 2015-02-20: 108 mg via INTRAVENOUS
  Filled 2015-02-20: qty 18

## 2015-02-20 MED ORDER — OXYCODONE HCL 10 MG PO TABS: 10.0000 mg | ORAL_TABLET | Freq: Four times a day (QID) | ORAL | Status: DC | PRN

## 2015-02-20 MED ORDER — SODIUM CHLORIDE 0.9 % IV SOLN
208.0000 mg | Freq: Once | INTRAVENOUS | Status: AC
  Administered 2015-02-20: 210 mg via INTRAVENOUS
  Filled 2015-02-20: qty 21

## 2015-02-20 MED ORDER — SODIUM CHLORIDE 0.9 % IV SOLN
10.0000 mg | Freq: Once | INTRAVENOUS | Status: AC
  Administered 2015-02-20: 10 mg via INTRAVENOUS
  Filled 2015-02-20: qty 1

## 2015-02-20 MED ORDER — SODIUM CHLORIDE 0.9 % IV SOLN
Freq: Once | INTRAVENOUS | Status: AC
  Administered 2015-02-20: 10:00:00 via INTRAVENOUS

## 2015-02-20 MED ORDER — PALONOSETRON HCL INJECTION 0.25 MG/5ML
INTRAVENOUS | Status: AC
  Filled 2015-02-20: qty 5

## 2015-02-20 MED ORDER — DIPHENHYDRAMINE HCL 50 MG/ML IJ SOLN
INTRAMUSCULAR | Status: AC
  Filled 2015-02-20: qty 1

## 2015-02-20 MED ORDER — FAMOTIDINE IN NACL 20-0.9 MG/50ML-% IV SOLN
INTRAVENOUS | Status: AC
  Filled 2015-02-20: qty 50

## 2015-02-20 MED ORDER — PALONOSETRON HCL INJECTION 0.25 MG/5ML
0.2500 mg | Freq: Once | INTRAVENOUS | Status: AC
  Administered 2015-02-20: 0.25 mg via INTRAVENOUS

## 2015-02-20 MED ORDER — SODIUM CHLORIDE 0.9 % IJ SOLN
10.0000 mL | INTRAMUSCULAR | Status: DC | PRN
  Administered 2015-02-20: 10 mL
  Filled 2015-02-20: qty 10

## 2015-02-20 NOTE — Progress Notes (Signed)
Follow-up completed with patient during chemotherapy. 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 Weight was documented as 231 pounds on November 4. Patient reports he did have some nausea directly after treatment but this was controlled with nausea medication.  Nutrition diagnosis: Predicted suboptimal energy intake related to recurrence of tonsil cancer as evidenced by history or presence of this condition for which research shows an increased incidence of suboptimal energy intake.  Intervention: Reviewed importance of patient consuming small frequent meals and snacks. Patient should continue Ensure Plus once daily. Reviewed strategies for eating with nausea and encouraged compliance with nausea medication. Questions were answered.  Teach back method used.  Monitoring, evaluation, goals: Patient will tolerate increased calories and protein to minimize weight loss throughout treatment.  Next visit: Friday, November 11, during infusion.  **Disclaimer: This note was dictated with voice recognition software. Similar sounding words can inadvertently be transcribed and this note may contain transcription errors which may not have been corrected upon publication of note.**

## 2015-02-20 NOTE — Patient Instructions (Signed)
[facility] Discharge Instructions for Patients Receiving Chemotherapy  Today you received the following chemotherapy agents taxol, carboplatin  To help prevent nausea and vomiting after your treatment, we encourage you to take your nausea medication   If you develop nausea and vomiting that is not controlled by your nausea medication, call the clinic.   BELOW ARE SYMPTOMS THAT SHOULD BE REPORTED IMMEDIATELY:  *FEVER GREATER THAN 100.5 F  *CHILLS WITH OR WITHOUT FEVER  NAUSEA AND VOMITING THAT IS NOT CONTROLLED WITH YOUR NAUSEA MEDICATION  *UNUSUAL SHORTNESS OF BREATH  *UNUSUAL BRUISING OR BLEEDING  TENDERNESS IN MOUTH AND THROAT WITH OR WITHOUT PRESENCE OF ULCERS  *URINARY PROBLEMS  *BOWEL PROBLEMS  UNUSUAL RASH Items with * indicate a potential emergency and should be followed up as soon as possible.  Feel free to call the clinic you have any questions or concerns. The clinic phone number is [phone].  Please show the CHEMO ALERT CARD at check-in to the Emergency Department and triage nurse.   

## 2015-02-20 NOTE — Telephone Encounter (Signed)
per pof to sch pt appt-sent MW emailt o sch pt trmt-gave pt cvopy of avs

## 2015-02-20 NOTE — Telephone Encounter (Signed)
Per staff message and POF I have scheduled appts. Advised scheduler of appts. JMW  

## 2015-02-21 ENCOUNTER — Encounter: Payer: Self-pay | Admitting: Hematology and Oncology

## 2015-02-21 NOTE — Assessment & Plan Note (Signed)
He has poor tolerance to morphine and dilaudid with nausea We will try to switch him to oxycodone

## 2015-02-21 NOTE — Assessment & Plan Note (Signed)
He tolerated treatment poorly and had nausea & vomiting for a few days I recommend keeping treatment at same dose but to modify anti-emetics regimen I plan to add Aloxi. He is instructed to take Compazine for 2 days after chemo Plan for minimum 6 doses of chemo before restaging scan

## 2015-02-21 NOTE — Progress Notes (Signed)
[facility] OFFICE PROGRESS NOTE  Patient Care Team: Lorayne Marek, MD as PCP - General (Internal Medicine) [name] Patient (General Practice) [name], RN as Registered Nurse (Oncology) [name], MD as Referring Physician (Otolaryngology) [name], MD as Consulting Physician (Hematology and Oncology) Karie Mainland, RD as Dietitian (Nutrition)  SUMMARY OF ONCOLOGIC HISTORY: Oncology History   Tonsillar cancer, HPV positive   Primary site: Pharynx - Oropharynx (Right)   Staging method: AJCC 7th Edition   Clinical: Stage IVA (T2, N2b, M0) signed by [name], MD on 08/19/2013  1:24 PM   Summary: Stage IVA (T2, N2b, M0)       Tonsillar cancer ([place])   07/09/2013 Procedure 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 FNA of right level III lymph node was inconclusive for cancer   07/25/2013 Imaging PET scan showed locally advanced disease with abnormal lymphadenopathy in the right axilla   08/06/2013 Procedure CT-guided biopsy of the lymphadenopathy was negative for malignancy   08/15/2013 Surgery Patient has placement of port and feeding tube   08/19/2013 - 09/10/2013 Chemotherapy Patient received chemotherapy with cisplatin. The patient only received 2 doses due to uncontrolled nausea and acute renal failure.   08/19/2013 - 10/15/2013 Radiation Therapy Patient received radiation treatment   08/27/2013 - 08/30/2013 Hospital Admission The patient was admitted to the hospital for uncontrolled nausea vomiting and dehydration.   02/14/2014 Imaging PET/CT scan showed complete response to treatment   03/19/2014 Surgery He had excisional lymph node biopsy from the right neck. Pathology was benign   05/13/2014 Surgery He had removal of Port-A-Cath.   05/22/2014 Imaging Repeat CT scan of the neck show no evidence of disease recurrence.   12/09/2014 Imaging Ct neck without contrast showed persistent abnormalities on the right side of neck,  indeterminate   12/25/2014 Imaging PET CT scan showed disease recurrence.   02/10/2015 Procedure He has placement of port    INTERVAL HISTORY: Please see below for problem oriented charting. He is seen prior to cycle 2 of chemo He had severe nausea and vomiting after cycle 1 of Rx. He complained of excessive sedation and nausea with Dilaudid His jaw pain is stable. No new lymphadenopathy Denies worsening neuropathy  REVIEW OF SYSTEMS:   Constitutional: Denies fevers, chills or abnormal weight loss Eyes: Denies blurriness of vision Ears, nose, mouth, throat, and face: Denies mucositis or sore throat Respiratory: Denies cough, dyspnea or wheezes Cardiovascular: Denies palpitation, chest discomfort or lower extremity swelling Gastrointestinal:  Denies nausea, heartburn or change in bowel habits Skin: Denies abnormal skin rashes Lymphatics: Denies new lymphadenopathy or easy bruising Neurological:Denies numbness, tingling or new weaknesses Behavioral/Psych: Mood is stable, no new changes  All other systems were reviewed with the patient and are negative.  I have reviewed the past medical history, past surgical history, social history and family history with the patient and they are unchanged from previous note.  ALLERGIES:  is allergic to pollen extract.  MEDICATIONS:  Current Outpatient Prescriptions  Medication Sig Dispense Refill  . acetaminophen (TYLENOL) 325 MG tablet Take 325-650 mg by mouth every 6 (six) hours as needed (pain).    . fluticasone (FLONASE) 50 MCG/ACT nasal spray Place 2 sprays into both nostrils daily. 16 g 2  . gabapentin (NEURONTIN) 300 MG capsule Take 1 capsule (300 mg total) by mouth 3 (three) times daily. 90 capsule 6  . lidocaine-prilocaine (EMLA) cream Apply 1 application topically as needed. 30 g 6  .  Multiple Vitamin (MULTIVITAMIN WITH MINERALS) TABS tablet Take 1 tablet by mouth daily.    . ondansetron (ZOFRAN) 8 MG tablet Take 1 tablet (8 mg total) by  mouth every 8 (eight) hours as needed for nausea or vomiting. 30 tablet 1  . prochlorperazine (COMPAZINE) 10 MG tablet Take 1 tablet (10 mg total) by mouth every 6 (six) hours as needed (Nausea or vomiting). 30 tablet 1  . oxyCODONE 10 MG TABS Take 1 tablet (10 mg total) by mouth every 6 (six) hours as needed for severe pain. 60 tablet 0   No current facility-administered medications for this visit.    PHYSICAL EXAMINATION: ECOG PERFORMANCE STATUS: 1 - Symptomatic but completely ambulatory  Filed Vitals:   02/20/15 0941  BP: 135/84  Pulse: 63  Temp: 98.3 F (36.8 C)  Resp: 18   Filed Weights   02/20/15 0941  Weight: 231 lb 1.6 oz (104.826 kg)    GENERAL:alert, no distress and comfortable SKIN: skin color, texture, turgor are normal, no rashes or significant lesions EYES: normal, Conjunctiva are pink and non-injected, sclera clear OROPHARYNX:no exudate, no erythema and lips, buccal mucosa, and tongue normal  NECK: supple, swelling near the angle of the jaw is stable LYMPH:  no palpable lymphadenopathy in the cervical, axillary or inguinal LUNGS: clear to auscultation and percussion with normal breathing effort HEART: regular rate & rhythm and no murmurs and no lower extremity edema Musculoskeletal:no cyanosis of digits and no clubbing  NEURO: alert & oriented x 3 with fluent speech, no focal motor/sensory deficits  LABORATORY DATA:  I have reviewed the data as listed    Component Value Date/Time   NA 140 02/20/2015 0859   NA 140 05/12/2014 1438   K 4.6 02/20/2015 0859   K 4.2 05/12/2014 1438   CL 104 05/12/2014 1438   CO2 26 02/20/2015 0859   CO2 30 05/12/2014 1438   GLUCOSE 84 02/20/2015 0859   GLUCOSE 84 05/12/2014 1438   BUN 22.8 02/20/2015 0859   BUN 8 05/12/2014 1438   CREATININE 1.7* 02/20/2015 0859   CREATININE 1.31 05/12/2014 1438   CALCIUM 9.6 02/20/2015 0859   CALCIUM 9.3 05/12/2014 1438   PROT 7.0 02/20/2015 0859   PROT 7.5 09/30/2013 0522   ALBUMIN  4.0 02/20/2015 0859   ALBUMIN 3.4* 09/30/2013 0522   AST 22 02/20/2015 0859   AST 24 09/30/2013 0522   ALT 16 02/20/2015 0859   ALT 43 09/30/2013 0522   ALKPHOS 48 02/20/2015 0859   ALKPHOS 101 09/30/2013 0522   BILITOT 0.64 02/20/2015 0859   BILITOT 0.3 09/30/2013 0522   GFRNONAA 64* 05/12/2014 1438   GFRAA 74* 05/12/2014 1438    No results found for: SPEP, UPEP  Lab Results  Component Value Date   WBC 4.5 02/20/2015   NEUTROABS 3.5 02/20/2015   HGB 12.7* 02/20/2015   HCT 39.2 02/20/2015   MCV 91.6 02/20/2015   PLT 216 02/20/2015      Chemistry      Component Value Date/Time   NA 140 02/20/2015 0859   NA 140 05/12/2014 1438   K 4.6 02/20/2015 0859   K 4.2 05/12/2014 1438   CL 104 05/12/2014 1438   CO2 26 02/20/2015 0859   CO2 30 05/12/2014 1438   BUN 22.8 02/20/2015 0859   BUN 8 05/12/2014 1438   CREATININE 1.7* 02/20/2015 0859   CREATININE 1.31 05/12/2014 1438      Component Value Date/Time   CALCIUM 9.6 02/20/2015 0859   CALCIUM  9.3 05/12/2014 1438   ALKPHOS 48 02/20/2015 0859   ALKPHOS 101 09/30/2013 0522   AST 22 02/20/2015 0859   AST 24 09/30/2013 0522   ALT 16 02/20/2015 0859   ALT 43 09/30/2013 0522   BILITOT 0.64 02/20/2015 0859   BILITOT 0.3 09/30/2013 0522     ASSESSMENT & PLAN:  Tonsillar cancer He tolerated treatment poorly and had nausea & vomiting for a few days I recommend keeping treatment at same dose but to modify anti-emetics regimen I plan to add Aloxi. He is instructed to take Compazine for 2 days after chemo Plan for minimum 6 doses of chemo before restaging scan  Anemia in neoplastic disease This is likely anemia of chronic disease. The patient denies recent history of bleeding such as epistaxis, hematuria or hematochezia. He is asymptomatic from the anemia. We will observe for now.  Chronic kidney disease (CKD), stage II (mild) His kidney function is stable. As long as his creatinine clearance remain greater than 30, we will  continue chemotherapy.    Cancer associated pain He has poor tolerance to morphine and dilaudid with nausea We will try to switch him to oxycodone   No orders of the defined types were placed in this encounter.   All questions were answered. The patient knows to call the clinic with any problems, questions or concerns. No barriers to learning was detected. I spent 25 minutes counseling the patient face to face. The total time spent in the appointment was 40 minutes and more than 50% was on counseling and review of test results     [facility] [place], MD 02/21/2015 8:26 AM

## 2015-02-21 NOTE — Assessment & Plan Note (Signed)
His kidney function is stable. As long as his creatinine clearance remain greater than 30, we will continue chemotherapy.

## 2015-02-21 NOTE — Assessment & Plan Note (Signed)
This is likely anemia of chronic disease. The patient denies recent history of bleeding such as epistaxis, hematuria or hematochezia. He is asymptomatic from the anemia. We will observe for now.  

## 2015-02-27 ENCOUNTER — Other Ambulatory Visit: Payer: Self-pay

## 2015-02-27 ENCOUNTER — Encounter: Payer: Self-pay | Admitting: Hematology and Oncology

## 2015-02-27 ENCOUNTER — Ambulatory Visit: Payer: Self-pay | Admitting: Hematology and Oncology

## 2015-02-27 ENCOUNTER — Encounter: Payer: Self-pay | Admitting: Nutrition

## 2015-02-27 ENCOUNTER — Ambulatory Visit: Payer: Medicaid Other | Admitting: Nutrition

## 2015-02-27 ENCOUNTER — Telehealth: Payer: Self-pay | Admitting: *Deleted

## 2015-02-27 ENCOUNTER — Ambulatory Visit: Payer: Medicaid Other

## 2015-02-27 ENCOUNTER — Ambulatory Visit (HOSPITAL_BASED_OUTPATIENT_CLINIC_OR_DEPARTMENT_OTHER): Payer: Medicaid Other

## 2015-02-27 ENCOUNTER — Other Ambulatory Visit (HOSPITAL_BASED_OUTPATIENT_CLINIC_OR_DEPARTMENT_OTHER): Payer: Medicaid Other

## 2015-02-27 VITALS — BP 126/78 | HR 71 | Temp 98.0°F | Resp 20

## 2015-02-27 VITALS — BP 142/81 | HR 113 | Temp 98.3°F | Resp 20

## 2015-02-27 DIAGNOSIS — C099 Malignant neoplasm of tonsil, unspecified: Secondary | ICD-10-CM

## 2015-02-27 DIAGNOSIS — Z5111 Encounter for antineoplastic chemotherapy: Secondary | ICD-10-CM | POA: Diagnosis present

## 2015-02-27 DIAGNOSIS — Z95828 Presence of other vascular implants and grafts: Secondary | ICD-10-CM

## 2015-02-27 LAB — COMPREHENSIVE METABOLIC PANEL (CC13)
ALT: 16 U/L (ref 0–55)
ANION GAP: 10 meq/L (ref 3–11)
AST: 20 U/L (ref 5–34)
Albumin: 3.9 g/dL (ref 3.5–5.0)
Alkaline Phosphatase: 59 U/L (ref 40–150)
BUN: 18.7 mg/dL (ref 7.0–26.0)
CALCIUM: 9.6 mg/dL (ref 8.4–10.4)
CHLORIDE: 105 meq/L (ref 98–109)
CO2: 25 mEq/L (ref 22–29)
CREATININE: 1.5 mg/dL — AB (ref 0.7–1.3)
EGFR: 61 mL/min/{1.73_m2} — ABNORMAL LOW (ref >90)
Glucose: 83 mg/dl (ref 70–140)
Potassium: 4.6 mEq/L (ref 3.5–5.1)
Sodium: 140 mEq/L (ref 136–145)
Total Bilirubin: 0.36 mg/dL (ref 0.20–1.20)
Total Protein: 7.1 g/dL (ref 6.4–8.3)

## 2015-02-27 LAB — CBC WITH DIFFERENTIAL/PLATELET
BASO%: 0.2 % (ref 0.0–2.0)
BASOS ABS: 0 10*3/uL (ref 0.0–0.1)
EOS ABS: 0.1 10*3/uL (ref 0.0–0.5)
EOS%: 0.9 % (ref 0.0–7.0)
HEMATOCRIT: 38.6 % (ref 38.4–49.9)
HGB: 12.7 g/dL — ABNORMAL LOW (ref 13.0–17.1)
LYMPH#: 0.6 10*3/uL — AB (ref 0.9–3.3)
LYMPH%: 8.9 % — ABNORMAL LOW (ref 14.0–49.0)
MCH: 30.6 pg (ref 27.2–33.4)
MCHC: 32.9 g/dL (ref 32.0–36.0)
MCV: 93 fL (ref 79.3–98.0)
MONO#: 0.4 10*3/uL (ref 0.1–0.9)
MONO%: 5.7 % (ref 0.0–14.0)
NEUT#: 5.6 10*3/uL (ref 1.5–6.5)
NEUT%: 84.3 % — AB (ref 39.0–75.0)
PLATELETS: 226 10*3/uL (ref 140–400)
RBC: 4.15 10*6/uL — ABNORMAL LOW (ref 4.20–5.82)
RDW: 13.2 % (ref 11.0–14.6)
WBC: 6.7 10*3/uL (ref 4.0–10.3)

## 2015-02-27 MED ORDER — SODIUM CHLORIDE 0.9 % IV SOLN
229.2000 mg | Freq: Once | INTRAVENOUS | Status: AC
  Administered 2015-02-27: 230 mg via INTRAVENOUS
  Filled 2015-02-27: qty 23

## 2015-02-27 MED ORDER — OXYCODONE-ACETAMINOPHEN 5-325 MG PO TABS
2.0000 | ORAL_TABLET | Freq: Once | ORAL | Status: AC
  Administered 2015-02-27: 2 via ORAL

## 2015-02-27 MED ORDER — SODIUM CHLORIDE 0.9 % IJ SOLN
10.0000 mL | INTRAMUSCULAR | Status: DC | PRN
  Administered 2015-02-27: 10 mL
  Filled 2015-02-27: qty 10

## 2015-02-27 MED ORDER — FAMOTIDINE IN NACL 20-0.9 MG/50ML-% IV SOLN
20.0000 mg | Freq: Once | INTRAVENOUS | Status: AC
  Administered 2015-02-27: 20 mg via INTRAVENOUS

## 2015-02-27 MED ORDER — DIPHENHYDRAMINE HCL 50 MG/ML IJ SOLN
INTRAMUSCULAR | Status: AC
  Filled 2015-02-27: qty 1

## 2015-02-27 MED ORDER — SODIUM CHLORIDE 0.9 % IV SOLN
10.0000 mg | Freq: Once | INTRAVENOUS | Status: AC
  Administered 2015-02-27: 10 mg via INTRAVENOUS
  Filled 2015-02-27: qty 1

## 2015-02-27 MED ORDER — HEPARIN SOD (PORK) LOCK FLUSH 100 UNIT/ML IV SOLN
500.0000 [IU] | Freq: Once | INTRAVENOUS | Status: AC
  Administered 2015-02-27: 500 [IU] via INTRAVENOUS
  Filled 2015-02-27: qty 5

## 2015-02-27 MED ORDER — PALONOSETRON HCL INJECTION 0.25 MG/5ML
0.2500 mg | Freq: Once | INTRAVENOUS | Status: AC
Start: 2015-02-27 — End: 2015-02-27
  Administered 2015-02-27: 0.25 mg via INTRAVENOUS

## 2015-02-27 MED ORDER — PACLITAXEL CHEMO INJECTION 300 MG/50ML
45.0000 mg/m2 | Freq: Once | INTRAVENOUS | Status: AC
  Administered 2015-02-27: 108 mg via INTRAVENOUS
  Filled 2015-02-27: qty 18

## 2015-02-27 MED ORDER — PALONOSETRON HCL INJECTION 0.25 MG/5ML
INTRAVENOUS | Status: AC
  Filled 2015-02-27: qty 5

## 2015-02-27 MED ORDER — DIPHENHYDRAMINE HCL 50 MG/ML IJ SOLN
50.0000 mg | Freq: Once | INTRAMUSCULAR | Status: AC
  Administered 2015-02-27: 50 mg via INTRAVENOUS

## 2015-02-27 MED ORDER — FAMOTIDINE IN NACL 20-0.9 MG/50ML-% IV SOLN
INTRAVENOUS | Status: AC
  Filled 2015-02-27: qty 50

## 2015-02-27 MED ORDER — OXYCODONE-ACETAMINOPHEN 5-325 MG PO TABS
ORAL_TABLET | ORAL | Status: AC
  Filled 2015-02-27: qty 2

## 2015-02-27 MED ORDER — SODIUM CHLORIDE 0.9 % IJ SOLN
10.0000 mL | INTRAMUSCULAR | Status: DC | PRN
  Administered 2015-02-27: 10 mL via INTRAVENOUS
  Filled 2015-02-27: qty 10

## 2015-02-27 MED ORDER — SODIUM CHLORIDE 0.9 % IV SOLN
Freq: Once | INTRAVENOUS | Status: AC
  Administered 2015-02-27: 10:00:00 via INTRAVENOUS

## 2015-02-27 MED ORDER — HEPARIN SOD (PORK) LOCK FLUSH 100 UNIT/ML IV SOLN
500.0000 [IU] | Freq: Once | INTRAVENOUS | Status: AC | PRN
  Administered 2015-02-27: 500 [IU]
  Filled 2015-02-27: qty 5

## 2015-02-27 NOTE — Patient Instructions (Signed)
[facility] Discharge Instructions for Patients Receiving Chemotherapy  Today you received the following chemotherapy agents Taxol/Carboplatin.  To help prevent nausea and vomiting after your treatment, we encourage you to take your nausea medication as directed.   If you develop nausea and vomiting that is not controlled by your nausea medication, call the clinic.   BELOW ARE SYMPTOMS THAT SHOULD BE REPORTED IMMEDIATELY:  *FEVER GREATER THAN 100.5 F  *CHILLS WITH OR WITHOUT FEVER  NAUSEA AND VOMITING THAT IS NOT CONTROLLED WITH YOUR NAUSEA MEDICATION  *UNUSUAL SHORTNESS OF BREATH  *UNUSUAL BRUISING OR BLEEDING  TENDERNESS IN MOUTH AND THROAT WITH OR WITHOUT PRESENCE OF ULCERS  *URINARY PROBLEMS  *BOWEL PROBLEMS  UNUSUAL RASH Items with * indicate a potential emergency and should be followed up as soon as possible.  Feel free to call the clinic you have any questions or concerns. The clinic phone number is [phone].  Please show the CHEMO ALERT CARD at check-in to the Emergency Department and triage nurse.    

## 2015-02-27 NOTE — Progress Notes (Signed)
Nutrition follow-up completed with patient during chemotherapy. Weight improved and documented as 231.1 pounds November 4. Patient reports several episodes of nausea however these were not severe. Patient is requesting samples of oral nutrition supplements.  Nutrition diagnosis: Predicted suboptimal energy intake continues.  Intervention: Reviewed strategies for improving nausea. Provided one complementary case of Ensure Plus. Teach back method used.  Monitoring, evaluation, goals: Patient will tolerate adequate calories and protein to minimize weight loss throughout treatment.  Next visit: Monday, December 12, during infusion.  **Disclaimer: This note was dictated with voice recognition software. Similar sounding words can inadvertently be transcribed and this note may contain transcription errors which may not have been corrected upon publication of note.**

## 2015-02-27 NOTE — Progress Notes (Signed)
Upon assessment, pt complaining of new onset of numbness and tingling in bilateral fingers and toes.  Pt states it is not interfering with ADLs.  Cameo, RN notified and Alvy Bimler, MD sent [employer] as Juluis Rainier.  Pt informed to let us know if it worsens before next appointment where pt is scheduled to meet with Alvy Bimler.  Will continue to monitor while in infusion room.

## 2015-02-27 NOTE — Progress Notes (Signed)
I placed fmla forms for wife of patient on desk of nurse for dr. Alvy Bimler

## 2015-02-27 NOTE — Telephone Encounter (Signed)
S/w pt in lobby.  He gave this nurse FMLA paperwork for his wife.  I placed forms in mail box for Raquel Browing in managed care dept.Marland Kitchen  He also has questions about how to apply for SSI Disability?  Informed him I will ask our SW, Polo Riley to speak w/ him about this.  He verbalized understanding.   LVM For Lauren to please contact pt about how to apply for Disability.  She called back and will see pt today in infusion room.

## 2015-03-06 ENCOUNTER — Other Ambulatory Visit: Payer: Self-pay | Admitting: Hematology and Oncology

## 2015-03-06 MED ORDER — OXYCODONE HCL 10 MG PO TABS: 10.0000 mg | ORAL_TABLET | Freq: Four times a day (QID) | ORAL | Status: DC | PRN

## 2015-03-16 ENCOUNTER — Encounter: Payer: Self-pay | Admitting: Hematology and Oncology

## 2015-03-16 ENCOUNTER — Other Ambulatory Visit (HOSPITAL_BASED_OUTPATIENT_CLINIC_OR_DEPARTMENT_OTHER): Payer: Medicaid Other

## 2015-03-16 ENCOUNTER — Ambulatory Visit: Payer: Medicaid Other

## 2015-03-16 ENCOUNTER — Encounter: Payer: Self-pay | Admitting: Nutrition

## 2015-03-16 ENCOUNTER — Ambulatory Visit (HOSPITAL_BASED_OUTPATIENT_CLINIC_OR_DEPARTMENT_OTHER): Payer: Medicaid Other

## 2015-03-16 ENCOUNTER — Ambulatory Visit (HOSPITAL_BASED_OUTPATIENT_CLINIC_OR_DEPARTMENT_OTHER): Payer: Medicaid Other | Admitting: Hematology and Oncology

## 2015-03-16 VITALS — BP 138/81 | HR 80 | Temp 98.1°F | Resp 20 | Ht 78.0 in | Wt 237.9 lb

## 2015-03-16 DIAGNOSIS — N182 Chronic kidney disease, stage 2 (mild): Secondary | ICD-10-CM | POA: Diagnosis not present

## 2015-03-16 DIAGNOSIS — Z5111 Encounter for antineoplastic chemotherapy: Secondary | ICD-10-CM | POA: Diagnosis present

## 2015-03-16 DIAGNOSIS — C099 Malignant neoplasm of tonsil, unspecified: Secondary | ICD-10-CM | POA: Diagnosis not present

## 2015-03-16 DIAGNOSIS — D630 Anemia in neoplastic disease: Secondary | ICD-10-CM

## 2015-03-16 DIAGNOSIS — T451X5A Adverse effect of antineoplastic and immunosuppressive drugs, initial encounter: Secondary | ICD-10-CM

## 2015-03-16 DIAGNOSIS — G620 Drug-induced polyneuropathy: Secondary | ICD-10-CM | POA: Diagnosis not present

## 2015-03-16 DIAGNOSIS — Z95828 Presence of other vascular implants and grafts: Secondary | ICD-10-CM

## 2015-03-16 DIAGNOSIS — R070 Pain in throat: Secondary | ICD-10-CM | POA: Diagnosis not present

## 2015-03-16 LAB — COMPREHENSIVE METABOLIC PANEL (CC13)
ALK PHOS: 59 U/L (ref 40–150)
ALT: 20 U/L (ref 0–55)
AST: 30 U/L (ref 5–34)
Albumin: 3.7 g/dL (ref 3.5–5.0)
Anion Gap: 9 mEq/L (ref 3–11)
BUN: 16.3 mg/dL (ref 7.0–26.0)
CO2: 25 meq/L (ref 22–29)
Calcium: 9.5 mg/dL (ref 8.4–10.4)
Chloride: 105 mEq/L (ref 98–109)
Creatinine: 1.8 mg/dL — ABNORMAL HIGH (ref 0.7–1.3)
EGFR: 50 mL/min/{1.73_m2} — AB (ref >90)
GLUCOSE: 83 mg/dL (ref 70–140)
POTASSIUM: 4.4 meq/L (ref 3.5–5.1)
SODIUM: 139 meq/L (ref 136–145)
Total Bilirubin: 0.3 mg/dL (ref 0.20–1.20)
Total Protein: 7.1 g/dL (ref 6.4–8.3)

## 2015-03-16 LAB — CBC WITH DIFFERENTIAL/PLATELET
BASO%: 0.3 % (ref 0.0–2.0)
BASOS ABS: 0 10*3/uL (ref 0.0–0.1)
EOS ABS: 0.1 10*3/uL (ref 0.0–0.5)
EOS%: 1.1 % (ref 0.0–7.0)
HCT: 37.7 % — ABNORMAL LOW (ref 38.4–49.9)
HGB: 12.3 g/dL — ABNORMAL LOW (ref 13.0–17.1)
LYMPH%: 9.7 % — AB (ref 14.0–49.0)
MCH: 30.1 pg (ref 27.2–33.4)
MCHC: 32.5 g/dL (ref 32.0–36.0)
MCV: 92.6 fL (ref 79.3–98.0)
MONO#: 0.4 10*3/uL (ref 0.1–0.9)
MONO%: 6.8 % (ref 0.0–14.0)
NEUT#: 4.7 10*3/uL (ref 1.5–6.5)
NEUT%: 82.1 % — AB (ref 39.0–75.0)
Platelets: 150 10*3/uL (ref 140–400)
RBC: 4.07 10*6/uL — AB (ref 4.20–5.82)
RDW: 14 % (ref 11.0–14.6)
WBC: 5.7 10*3/uL (ref 4.0–10.3)
lymph#: 0.6 10*3/uL — ABNORMAL LOW (ref 0.9–3.3)

## 2015-03-16 MED ORDER — SODIUM CHLORIDE 0.9 % IV SOLN
Freq: Once | INTRAVENOUS | Status: AC
  Administered 2015-03-16: 12:00:00 via INTRAVENOUS

## 2015-03-16 MED ORDER — FAMOTIDINE IN NACL 20-0.9 MG/50ML-% IV SOLN
INTRAVENOUS | Status: AC
  Filled 2015-03-16: qty 50

## 2015-03-16 MED ORDER — DIPHENHYDRAMINE HCL 50 MG/ML IJ SOLN
INTRAMUSCULAR | Status: AC
  Filled 2015-03-16: qty 1

## 2015-03-16 MED ORDER — OXYCODONE HCL 10 MG PO TABS: 10.0000 mg | ORAL_TABLET | Freq: Four times a day (QID) | ORAL | Status: DC | PRN

## 2015-03-16 MED ORDER — DEXTROSE 5 % IV SOLN
45.0000 mg/m2 | Freq: Once | INTRAVENOUS | Status: AC
  Administered 2015-03-16: 108 mg via INTRAVENOUS
  Filled 2015-03-16: qty 18

## 2015-03-16 MED ORDER — SODIUM CHLORIDE 0.9 % IV SOLN
10.0000 mg | Freq: Once | INTRAVENOUS | Status: AC
  Administered 2015-03-16: 10 mg via INTRAVENOUS
  Filled 2015-03-16: qty 1

## 2015-03-16 MED ORDER — HEPARIN SOD (PORK) LOCK FLUSH 100 UNIT/ML IV SOLN
500.0000 [IU] | Freq: Once | INTRAVENOUS | Status: AC | PRN
  Administered 2015-03-16: 500 [IU]
  Filled 2015-03-16: qty 5

## 2015-03-16 MED ORDER — SODIUM CHLORIDE 0.9 % IJ SOLN
10.0000 mL | INTRAMUSCULAR | Status: DC | PRN
  Administered 2015-03-16: 10 mL
  Filled 2015-03-16: qty 10

## 2015-03-16 MED ORDER — FAMOTIDINE IN NACL 20-0.9 MG/50ML-% IV SOLN
20.0000 mg | Freq: Once | INTRAVENOUS | Status: AC
  Administered 2015-03-16: 20 mg via INTRAVENOUS

## 2015-03-16 MED ORDER — PALONOSETRON HCL INJECTION 0.25 MG/5ML
INTRAVENOUS | Status: AC
  Filled 2015-03-16: qty 5

## 2015-03-16 MED ORDER — DIPHENHYDRAMINE HCL 50 MG/ML IJ SOLN
50.0000 mg | Freq: Once | INTRAMUSCULAR | Status: AC
  Administered 2015-03-16: 50 mg via INTRAVENOUS

## 2015-03-16 MED ORDER — PALONOSETRON HCL INJECTION 0.25 MG/5ML
0.2500 mg | Freq: Once | INTRAVENOUS | Status: AC
  Administered 2015-03-16: 0.25 mg via INTRAVENOUS

## 2015-03-16 MED ORDER — SODIUM CHLORIDE 0.9 % IJ SOLN
10.0000 mL | INTRAMUSCULAR | Status: DC | PRN
  Filled 2015-03-16: qty 10

## 2015-03-16 MED ORDER — SODIUM CHLORIDE 0.9 % IV SOLN
200.0000 mg | Freq: Once | INTRAVENOUS | Status: AC
  Administered 2015-03-16: 200 mg via INTRAVENOUS
  Filled 2015-03-16: qty 20

## 2015-03-16 NOTE — Assessment & Plan Note (Signed)
This is likely anemia of chronic disease. The patient denies recent history of bleeding such as epistaxis, hematuria or hematochezia. He is asymptomatic from the anemia. We will observe for now.  

## 2015-03-16 NOTE — Assessment & Plan Note (Signed)
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 After extensive discussion, he was willing to start gabapentin 300 mg. Since I saw him last month, he finds that very helpful  I will continue same dose treatment without further dose adjustment.

## 2015-03-16 NOTE — Assessment & Plan Note (Signed)
His kidney function is stable. As long as his creatinine clearance remain greater than 30, we will continue chemotherapy. 

## 2015-03-16 NOTE — Assessment & Plan Note (Signed)
He tolerated treatment # 1 poorly and had nausea & vomiting for a few days I recommend keeping treatment at same dose but to modify anti-emetics regimen  I modify his anti-emetics regimen and he tolerated that better. I plan to give him total 6 cycles of treatment and I ordered a PET CT scan for evaluation.

## 2015-03-16 NOTE — Patient Instructions (Signed)
[facility] Discharge Instructions for Patients Receiving Chemotherapy  Today you received the following chemotherapy agents Taxol/Carboplatin.  To help prevent nausea and vomiting after your treatment, we encourage you to take your nausea medication as directed.   If you develop nausea and vomiting that is not controlled by your nausea medication, call the clinic.   BELOW ARE SYMPTOMS THAT SHOULD BE REPORTED IMMEDIATELY:  *FEVER GREATER THAN 100.5 F  *CHILLS WITH OR WITHOUT FEVER  NAUSEA AND VOMITING THAT IS NOT CONTROLLED WITH YOUR NAUSEA MEDICATION  *UNUSUAL SHORTNESS OF BREATH  *UNUSUAL BRUISING OR BLEEDING  TENDERNESS IN MOUTH AND THROAT WITH OR WITHOUT PRESENCE OF ULCERS  *URINARY PROBLEMS  *BOWEL PROBLEMS  UNUSUAL RASH Items with * indicate a potential emergency and should be followed up as soon as possible.  Feel free to call the clinic you have any questions or concerns. The clinic phone number is [phone].  Please show the CHEMO ALERT CARD at check-in to the Emergency Department and triage nurse.    

## 2015-03-16 NOTE — Progress Notes (Signed)
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 FNA of right level III lymph node was inconclusive for cancer   07/25/2013 Imaging PET scan showed locally advanced disease with abnormal lymphadenopathy in the right axilla   08/06/2013 Procedure CT-guided biopsy of the lymphadenopathy was negative for malignancy   08/15/2013 Surgery Patient has placement of port and feeding tube   08/19/2013 - 09/10/2013 Chemotherapy Patient received chemotherapy with cisplatin. The patient only received 2 doses due to uncontrolled nausea and acute renal failure.   08/19/2013 - 10/15/2013 Radiation Therapy Patient received radiation treatment   08/27/2013 - 08/30/2013 Hospital Admission The patient was admitted to the hospital for uncontrolled nausea vomiting and dehydration.   02/14/2014 Imaging PET/CT scan showed complete response to treatment   03/19/2014 Surgery He had excisional lymph node biopsy from the right neck. Pathology was benign   05/13/2014 Surgery He had removal of Port-A-Cath.   05/22/2014 Imaging Repeat CT scan of the neck show no evidence of disease recurrence.   12/09/2014 Imaging Ct neck without contrast showed persistent abnormalities on the right side of neck,  indeterminate   12/25/2014 Imaging PET CT scan showed disease recurrence.   02/10/2015 Procedure He has placement of port    INTERVAL HISTORY: Please see below for problem oriented charting.  he tolerated last treatment well without significant nausea or vomiting. Pain is better controlled with oxycodone.  he continues have mild peripheral neuropathy but it does not bother him too much.  REVIEW OF SYSTEMS:   Constitutional: Denies fevers, chills or abnormal weight loss Eyes: Denies blurriness of vision Ears, nose, mouth, throat, and face: Denies mucositis or sore throat Respiratory: Denies cough, dyspnea or wheezes Cardiovascular: Denies palpitation, chest discomfort or lower extremity swelling Gastrointestinal:  Denies nausea, heartburn or change in bowel habits Skin: Denies abnormal skin rashes Lymphatics: Denies new lymphadenopathy or easy bruising Neurological:Denies numbness, tingling or new weaknesses Behavioral/Psych: Mood is stable, no new changes  All other systems were reviewed with the patient and are negative.  I have reviewed the past medical history, past surgical history, social history and family history with the patient and they are unchanged from previous note.  ALLERGIES:  is allergic to pollen extract.  MEDICATIONS:  Current Outpatient Prescriptions  Medication Sig Dispense Refill  . acetaminophen (TYLENOL) 325 MG tablet Take 325-650 mg by mouth every 6 (six) hours as needed (pain).    . fluticasone (FLONASE) 50 MCG/ACT nasal spray Place 2 sprays into both nostrils daily. 16 g 2  . gabapentin (NEURONTIN) 300 MG capsule Take 1 capsule (300 mg total) by mouth 3 (three) times daily. 90 capsule 6  . lidocaine-prilocaine (EMLA) cream Apply 1 application topically as needed. 30 g 6  . Multiple Vitamin (MULTIVITAMIN WITH MINERALS) TABS tablet  Take 1 tablet by mouth daily.    . ondansetron (ZOFRAN) 8 MG tablet Take 1 tablet (8 mg total) by mouth every 8 (eight) hours as  needed for nausea or vomiting. 30 tablet 1  . Oxycodone HCl 10 MG TABS Take 1 tablet (10 mg total) by mouth every 6 (six) hours as needed. 60 tablet 0  . prochlorperazine (COMPAZINE) 10 MG tablet Take 1 tablet (10 mg total) by mouth every 6 (six) hours as needed (Nausea or vomiting). 30 tablet 1   No current facility-administered medications for this visit.   Facility-Administered Medications Ordered in Other Visits  Medication Dose Route Frequency Provider Last Rate Last Dose  . CARBOplatin (PARAPLATIN) 200 mg in sodium chloride 0.9 % 100 mL chemo infusion  200 mg Intravenous Once [name], MD      . heparin lock flush 100 unit/mL  500 Units Intracatheter Once PRN [name], MD      . PACLitaxel (TAXOL) 108 mg in dextrose 5 % 250 mL chemo infusion (</= 80mg /m2)  45 mg/m2 (Treatment Plan Actual) Intravenous Once [name], MD 268 mL/hr at 03/16/15 1204 108 mg at 03/16/15 1204  . sodium chloride 0.9 % injection 10 mL  10 mL Intracatheter PRN [name], MD        PHYSICAL EXAMINATION: ECOG PERFORMANCE STATUS: 1 - Symptomatic but completely ambulatory  Filed Vitals:   03/16/15 1049  BP: 138/81  Pulse: 80  Temp: 98.1 F (36.7 C)  Resp: 20   Filed Weights   03/16/15 1049  Weight: 237 lb 14.4 oz (107.911 kg)    GENERAL:alert, no distress and comfortable SKIN: skin color, texture, turgor are normal, no rashes or significant lesions EYES: normal, Conjunctiva are pink and non-injected, sclera clear OROPHARYNX:no exudate, no erythema and lips, buccal mucosa, and tongue normal  NECK: significant scarring on the right neck. Previously palpable lymphadenopathy has regressed. LYMPH:  no palpable lymphadenopathy in the cervical, axillary or inguinal LUNGS: clear to auscultation and percussion with normal breathing effort HEART: regular rate & rhythm and no murmurs and no lower extremity edema ABDOMEN:abdomen soft, non-tender and normal bowel sounds Musculoskeletal:no cyanosis of digits  and no clubbing  NEURO: alert & oriented x 3 with fluent speech, no focal motor/sensory deficits  LABORATORY DATA:  I have reviewed the data as listed    Component Value Date/Time   NA 139 03/16/2015 0929   NA 140 05/12/2014 1438   K 4.4 03/16/2015 0929   K 4.2 05/12/2014 1438   CL 104 05/12/2014 1438   CO2 25 03/16/2015 0929   CO2 30 05/12/2014 1438   GLUCOSE 83 03/16/2015 0929   GLUCOSE 84 05/12/2014 1438   BUN 16.3 03/16/2015 0929   BUN 8 05/12/2014 1438   CREATININE 1.8* 03/16/2015 0929   CREATININE 1.31 05/12/2014 1438   CALCIUM 9.5 03/16/2015 0929   CALCIUM 9.3 05/12/2014 1438   PROT 7.1 03/16/2015 0929   PROT 7.5 09/30/2013 0522   ALBUMIN 3.7 03/16/2015 0929   ALBUMIN 3.4* 09/30/2013 0522   AST 30 03/16/2015 0929   AST 24 09/30/2013 0522   ALT 20 03/16/2015 0929   ALT 43 09/30/2013 0522   ALKPHOS 59 03/16/2015 0929   ALKPHOS 101 09/30/2013 0522   BILITOT 0.30 03/16/2015 0929   BILITOT 0.3 09/30/2013 0522   GFRNONAA 64* 05/12/2014 1438   GFRAA 74* 05/12/2014 1438    No results found for: SPEP, UPEP  Lab Results  Component Value Date   WBC 5.7 03/16/2015  NEUTROABS 4.7 03/16/2015   HGB 12.3* 03/16/2015   HCT 37.7* 03/16/2015   MCV 92.6 03/16/2015   PLT 150 03/16/2015      Chemistry      Component Value Date/Time   NA 139 03/16/2015 0929   NA 140 05/12/2014 1438   K 4.4 03/16/2015 0929   K 4.2 05/12/2014 1438   CL 104 05/12/2014 1438   CO2 25 03/16/2015 0929   CO2 30 05/12/2014 1438   BUN 16.3 03/16/2015 0929   BUN 8 05/12/2014 1438   CREATININE 1.8* 03/16/2015 0929   CREATININE 1.31 05/12/2014 1438      Component Value Date/Time   CALCIUM 9.5 03/16/2015 0929   CALCIUM 9.3 05/12/2014 1438   ALKPHOS 59 03/16/2015 0929   ALKPHOS 101 09/30/2013 0522   AST 30 03/16/2015 0929   AST 24 09/30/2013 0522   ALT 20 03/16/2015 0929   ALT 43 09/30/2013 0522   BILITOT 0.30 03/16/2015 0929   BILITOT 0.3 09/30/2013 0522     ASSESSMENT & PLAN:   Tonsillar cancer He tolerated treatment # 1 poorly and had nausea & vomiting for a few days I recommend keeping treatment at same dose but to modify anti-emetics regimen  I modify his anti-emetics regimen and he tolerated that better. I plan to give him total 6 cycles of treatment and I ordered a PET CT scan for evaluation.  Chronic kidney disease (CKD), stage II (mild) His kidney function is stable. As long as his creatinine clearance remain greater than 30, we will continue chemotherapy.  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 After extensive discussion, he was willing to start gabapentin 300 mg. Since I saw him last month, he finds that very helpful  I will continue same dose treatment without further dose adjustment.  Throat pain in adult He has poorly controlled pain with Percocet. I recommend increasing his pain medication to IR morphine to be taking along with Tylenol for pain control.  he felt that IR morphine caused too much nausea and I switched him to oxycodone with good success. I will reassess pain control in the next visit.  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 The patient denies recent history of bleeding such as epistaxis, hematuria or hematochezia. He is asymptomatic from the anemia. We will observe for now.     Orders Placed This Encounter  Procedures  . NM PET Image Restag (PS) Skull Base To Thigh    Standing Status: Future     Number of Occurrences:      Standing Expiration Date: 05/15/2016    Order Specific Question:  Reason for Exam (SYMPTOM  OR DIAGNOSIS REQUIRED)    Answer:  staging tonsil ca, assess response to Rx    Order Specific Question:  Preferred imaging location?    Answer:  [facility]   All questions were answered. The patient knows to call the clinic with any problems, questions or concerns. No barriers  to learning was detected. I spent 25 minutes counseling the patient face to face. The total time spent in the appointment was 40 minutes and more than 50% was on counseling and review of test results     [facility], [place], MD 03/16/2015 12:18 PM

## 2015-03-16 NOTE — Progress Notes (Signed)
OK to treat today with Creat of 1.8 per Dr. Alvy Bimler.

## 2015-03-16 NOTE — Patient Instructions (Signed)

## 2015-03-16 NOTE — Assessment & Plan Note (Signed)
He has poorly controlled pain with Percocet. I recommend increasing his pain medication to IR morphine to be taking along with Tylenol for pain control.  he felt that IR morphine caused too much nausea and I switched him to oxycodone with good success. I will reassess pain control in the next visit.

## 2015-03-17 ENCOUNTER — Telehealth: Payer: Self-pay | Admitting: *Deleted

## 2015-03-17 ENCOUNTER — Telehealth: Payer: Self-pay | Admitting: Hematology and Oncology

## 2015-03-17 NOTE — Telephone Encounter (Signed)
Per staff message and POF I have scheduled appts. Advised scheduler of appts. JMW  

## 2015-03-17 NOTE — Telephone Encounter (Signed)
per pof to sch pt appt-gave pt copy of avs-sent MW email to sch trmt-will clal pt after reply

## 2015-03-18 ENCOUNTER — Telehealth: Payer: Self-pay | Admitting: Hematology and Oncology

## 2015-03-18 NOTE — Progress Notes (Signed)
Mason [name] presents for follow-up of radiation completed 10/15/2013 to his Right Tonsil.   Pain issues, if any: He rates his pain a 5/10, he states the pain is worse after he eats. He takes oxycodone twice a day in the morning and at night to relieve his pain. Using a feeding tube?: No (he had one removed) Weight changes, if any: He reports his weight is staying steady.  Wt Readings from Last 3 Encounters:  03/20/15 238 lb 3.2 oz (108.047 kg)  03/16/15 237 lb 14.4 oz (107.911 kg)  02/20/15 231 lb 1.6 oz (104.826 kg)   Swallowing issues, if any: He is eating mostly foods that he purees. He will eat meats with lots of gravy. He drinks two ensures a day, and smoothies also.  Smoking or chewing tobacco? No Using fluoride trays daily? He uses the trays twice a week.  Last ENT visit was on: Dr. Simeon Craft on 12/29/14 for reoccurance found on a PET scan 12/25/2014 that showed hypermetabolic right tonsillar bed/mass and right hypermetabolic cervical mass with also small hypermetabolic lung masses and a small right inguinal mass that was also hypermetabolic.  Other notable issues, if any: He complains of a dry throat and carries a water bottle with him at all times. He reports his taste buds are slowly coming back.   He is receiving chemotherapy from Dr. Alvy Bimler. He received 4th cycle on 03/16/15.

## 2015-03-18 NOTE — Telephone Encounter (Signed)
pt top get updated copy @ 12/5 appt-per pt

## 2015-03-20 ENCOUNTER — Encounter: Payer: Self-pay | Admitting: Radiation Oncology

## 2015-03-20 ENCOUNTER — Ambulatory Visit
Admission: RE | Admit: 2015-03-20 | Discharge: 2015-03-20 | Disposition: A | Discharge status: Home / Self Care | Payer: Medicaid Other | Source: Ambulatory Visit | Attending: Radiation Oncology | Admitting: Radiation Oncology

## 2015-03-20 ENCOUNTER — Encounter: Payer: Self-pay | Admitting: *Deleted

## 2015-03-20 DIAGNOSIS — C090 Malignant neoplasm of tonsillar fossa: Secondary | ICD-10-CM

## 2015-03-20 NOTE — Progress Notes (Addendum)
Radiation Oncology         [phone] ________________________________  Name: Mason [name] MRN: [id number]  Date: 03/20/2015  DOB: May 17, 1965  Follow-Up Visit Note  CC: Lorayne Marek, MD  [name], MD   ICD-9-CM ICD-10-CM   1. Primary cancer of tonsillar fossa (HCC) 146.1 C09.0     Diagnosis and Prior Radiotherapy:   [id number] Right Tonsil Squamous cell carcinoma, Stage IVA  Indication for treatment: Curative with chemotherapy  Radiation treatment dates: 08/19/2013-10/15/2013  Site/dose: Right Tonsil and bilateral neck / 70 Gy in 35 fractions to gross disease, 63 Gy in 35 fractions to high risk nodal echelons, and 56 Gy in 35 fractions to intermediate risk nodal echelons  Narrative:  The patient returns today for routine follow-up. He unfortunately has metastatic disease now. But, he is in great spirits and looks good.    Pain issues, if any: He rates his pain a 5/10, he states the pain is worse after he eats. He takes oxycodone twice a day in the morning and at night to relieve his pain.  Using a feeding tube?: No. He had it removed.  Weight changes, if any: He reports his weight is staying steady.  Wt Readings from Last 3 Encounters:   03/20/15  238 lb 3.2 oz (108.047 kg)   03/16/15  237 lb 14.4 oz (107.911 kg)   02/20/15  231 lb 1.6 oz (104.826 kg)    Swallowing issues, if any: He is eating mostly foods that he purees. He will eat meats with lots of gravy. He drinks two ensures a day and smoothies as well.  Smoking or chewing tobacco? No  Using fluoride trays daily? He uses the trays twice a week.  Last ENT visit was on: Dr. Simeon Craft on 12/29/14 for recurrence found on a PET scan 12/25/2014 that showed hypermetabolic right tonsillar bed/mass and right hypermetabolic cervical mass with also small hypermetabolic lung masses and a small right inguinal mass that was also hypermetabolic.  Other notable issues, if any: He complains of a dry throat and carries a water bottle with him  at all times. He reports his taste buds are slowly coming back. He also reports nausea with emesis. This is improved. He reports drainage of the scar on the right side of his neck. He is receiving chemotherapy from Dr. Alvy Bimler. He received 4th cycle on 03/16/15.  ALLERGIES:  is allergic to pollen extract.  Meds: Current Outpatient Prescriptions  Medication Sig Dispense Refill  . acetaminophen (TYLENOL) 325 MG tablet Take 325-650 mg by mouth every 6 (six) hours as needed (pain).    . fluticasone (FLONASE) 50 MCG/ACT nasal spray Place 2 sprays into both nostrils daily. 16 g 2  . lidocaine-prilocaine (EMLA) cream Apply 1 application topically as needed. 30 g 6  . Multiple Vitamin (MULTIVITAMIN WITH MINERALS) TABS tablet Take 1 tablet by mouth daily.    . ondansetron (ZOFRAN) 8 MG tablet Take 1 tablet (8 mg total) by mouth every 8 (eight) hours as needed for nausea or vomiting. 30 tablet 1  . Oxycodone HCl 10 MG TABS Take 1 tablet (10 mg total) by mouth every 6 (six) hours as needed. 60 tablet 0  . prochlorperazine (COMPAZINE) 10 MG tablet Take 1 tablet (10 mg total) by mouth every 6 (six) hours as needed (Nausea or vomiting). 30 tablet 1   No current facility-administered medications for this encounter.    Physical Findings: The patient is in no acute distress. Patient is alert and oriented.  height is 6\' 6"  (1.981 m) and weight is 238 lb 3.2 oz (108.047 kg). His temperature is 98.1 F (36.7 C). His blood pressure is 111/71 and his pulse is 104. Marland Kitchen  He has no oral or pharyngeal lesions. No thrush. Some post treatment fibrosis along the right upper neck.  No obvious mass. Skin- the surgical scar along the right upper neck has some crusting and a small, whitish, nodule at the inferior aspect of the scar consistent with superficial infection.  Lab Findings: Lab Results  Component Value Date   WBC 5.7 03/16/2015   HGB 12.3* 03/16/2015   HCT 37.7* 03/16/2015   MCV 92.6 03/16/2015   PLT 150  03/16/2015    Lab Results  Component Value Date   TSH 1.636 08/22/2014    Radiographic Findings: No results found.  Impression/Plan:    1) Head and Neck Cancer Status: recurrent disease  2) Nutritional Status: Intake tolerated well. - PEG tube:removed.  3) Risk Factors: The patient has been educated about risk factors including alcohol and tobacco abuse   4) Swallowing: Sticks to soft foods.  5) Dental: Encouraged to continue regular followup with dentistry, and dental hygiene including fluoride rinses.    6) Energy: will screen TSH q 6-8mo.  If a Rx is needed, then it will be filled at the [facility] on [address]. Lab Results  Component Value Date   TSH 1.636 08/22/2014   7) Social: No complaints at this time.  8) Other: Continue chemotherapy as scheduled.  I advised the patient to take his zofran and compazine around the clock for his nausea symptoms. I will notify Dr. Alvy Bimler about the patient's right neck scar which might have a possible superficial infection and I recommend she continue yearly TSH labs to screen for hypothyroidism.  9) The patient will f/u with me on a PRN basis. The patient was encouraged to call with any isss or questions before then. _____________________________________   Eppie Gibson, MD  This document serves as a record of services personally performed by Eppie Gibson, MD. It was created on his behalf by Darcus Austin, a trained medical scribe. The creation of this record is based on the scribe's personal observations and the provider's statements to them. This document has been checked and approved by the attending provider.

## 2015-03-20 NOTE — Progress Notes (Signed)
  Oncology Nurse Navigator Documentation   Navigator Encounter Type: Clinic/MDC (03/20/15 1055) Patient Visit Type: Radonc;Follow-up (03/20/15 1055)     To provide support and encouragement, care continuity and to assess for needs, met with patient during f/u appt with Dr. Isidore Moos.  He shared that his current financial situation is difficult.  I noted I had just received a call from [place] and she is investigating opportunities for financial support.  He was aware.  He did not express any needs or concerns at this time, I encouraged him to contact me if that changes, he verbalized understanding.   Gayleen Orem, RN, BSN, [facility] [place] [phone]                   Time Spent with Patient: 30 (03/20/15 1055)

## 2015-03-23 ENCOUNTER — Ambulatory Visit: Payer: Medicaid Other

## 2015-03-23 ENCOUNTER — Other Ambulatory Visit: Payer: Self-pay | Admitting: Hematology and Oncology

## 2015-03-23 ENCOUNTER — Ambulatory Visit (HOSPITAL_BASED_OUTPATIENT_CLINIC_OR_DEPARTMENT_OTHER): Payer: Medicaid Other

## 2015-03-23 ENCOUNTER — Other Ambulatory Visit (HOSPITAL_BASED_OUTPATIENT_CLINIC_OR_DEPARTMENT_OTHER): Payer: Medicaid Other

## 2015-03-23 VITALS — BP 146/68 | HR 74 | Temp 98.4°F

## 2015-03-23 DIAGNOSIS — C099 Malignant neoplasm of tonsil, unspecified: Secondary | ICD-10-CM

## 2015-03-23 DIAGNOSIS — Z5111 Encounter for antineoplastic chemotherapy: Secondary | ICD-10-CM

## 2015-03-23 LAB — CBC WITH DIFFERENTIAL/PLATELET
BASO%: 0.4 % (ref 0.0–2.0)
Basophils Absolute: 0 10*3/uL (ref 0.0–0.1)
EOS%: 1.4 % (ref 0.0–7.0)
Eosinophils Absolute: 0.1 10*3/uL (ref 0.0–0.5)
HEMATOCRIT: 36.2 % — AB (ref 38.4–49.9)
HEMOGLOBIN: 12 g/dL — AB (ref 13.0–17.1)
LYMPH#: 0.5 10*3/uL — AB (ref 0.9–3.3)
LYMPH%: 12.2 % — ABNORMAL LOW (ref 14.0–49.0)
MCH: 30.4 pg (ref 27.2–33.4)
MCHC: 33.1 g/dL (ref 32.0–36.0)
MCV: 91.9 fL (ref 79.3–98.0)
MONO#: 0.2 10*3/uL (ref 0.1–0.9)
MONO%: 4.8 % (ref 0.0–14.0)
NEUT%: 81.2 % — ABNORMAL HIGH (ref 39.0–75.0)
NEUTROS ABS: 3.2 10*3/uL (ref 1.5–6.5)
Platelets: 158 10*3/uL (ref 140–400)
RBC: 3.94 10*6/uL — ABNORMAL LOW (ref 4.20–5.82)
RDW: 13.8 % (ref 11.0–14.6)
WBC: 4 10*3/uL (ref 4.0–10.3)

## 2015-03-23 LAB — COMPREHENSIVE METABOLIC PANEL
ALBUMIN: 3.9 g/dL (ref 3.5–5.0)
ALK PHOS: 54 U/L (ref 40–150)
ALT: 19 U/L (ref 0–55)
AST: 24 U/L (ref 5–34)
Anion Gap: 8 mEq/L (ref 3–11)
BUN: 19.7 mg/dL (ref 7.0–26.0)
CALCIUM: 9.2 mg/dL (ref 8.4–10.4)
CO2: 25 mEq/L (ref 22–29)
CREATININE: 1.6 mg/dL — AB (ref 0.7–1.3)
Chloride: 105 mEq/L (ref 98–109)
EGFR: 58 mL/min/{1.73_m2} — ABNORMAL LOW (ref >90)
GLUCOSE: 90 mg/dL (ref 70–140)
POTASSIUM: 4.6 meq/L (ref 3.5–5.1)
SODIUM: 139 meq/L (ref 136–145)
Total Bilirubin: 0.44 mg/dL (ref 0.20–1.20)
Total Protein: 7.1 g/dL (ref 6.4–8.3)

## 2015-03-23 MED ORDER — SODIUM CHLORIDE 0.9 % IV SOLN
Freq: Once | INTRAVENOUS | Status: AC
  Administered 2015-03-23: 10:00:00 via INTRAVENOUS

## 2015-03-23 MED ORDER — FAMOTIDINE IN NACL 20-0.9 MG/50ML-% IV SOLN
INTRAVENOUS | Status: AC
  Filled 2015-03-23: qty 50

## 2015-03-23 MED ORDER — OXYCODONE HCL 10 MG PO TABS: 10.0000 mg | ORAL_TABLET | Freq: Four times a day (QID) | ORAL | Status: DC | PRN

## 2015-03-23 MED ORDER — PACLITAXEL CHEMO INJECTION 300 MG/50ML
45.0000 mg/m2 | Freq: Once | INTRAVENOUS | Status: AC
  Administered 2015-03-23: 108 mg via INTRAVENOUS
  Filled 2015-03-23: qty 18

## 2015-03-23 MED ORDER — DEXAMETHASONE SODIUM PHOSPHATE 100 MG/10ML IJ SOLN
10.0000 mg | Freq: Once | INTRAMUSCULAR | Status: AC
  Administered 2015-03-23: 10 mg via INTRAVENOUS
  Filled 2015-03-23: qty 1

## 2015-03-23 MED ORDER — SODIUM CHLORIDE 0.9 % IV SOLN
218.0000 mg | Freq: Once | INTRAVENOUS | Status: AC
  Administered 2015-03-23: 220 mg via INTRAVENOUS
  Filled 2015-03-23: qty 22

## 2015-03-23 MED ORDER — PALONOSETRON HCL INJECTION 0.25 MG/5ML
INTRAVENOUS | Status: AC
  Filled 2015-03-23: qty 5

## 2015-03-23 MED ORDER — SODIUM CHLORIDE 0.9 % IJ SOLN
10.0000 mL | INTRAMUSCULAR | Status: DC | PRN
  Administered 2015-03-23: 10 mL
  Filled 2015-03-23: qty 10

## 2015-03-23 MED ORDER — DIPHENHYDRAMINE HCL 50 MG/ML IJ SOLN
50.0000 mg | Freq: Once | INTRAMUSCULAR | Status: AC
  Administered 2015-03-23: 50 mg via INTRAVENOUS

## 2015-03-23 MED ORDER — PALONOSETRON HCL INJECTION 0.25 MG/5ML
0.2500 mg | Freq: Once | INTRAVENOUS | Status: AC
  Administered 2015-03-23: 0.25 mg via INTRAVENOUS

## 2015-03-23 MED ORDER — HEPARIN SOD (PORK) LOCK FLUSH 100 UNIT/ML IV SOLN
500.0000 [IU] | Freq: Once | INTRAVENOUS | Status: AC | PRN
  Administered 2015-03-23: 500 [IU]
  Filled 2015-03-23: qty 5

## 2015-03-23 MED ORDER — SODIUM CHLORIDE 0.9 % IJ SOLN
10.0000 mL | Freq: Once | INTRAMUSCULAR | Status: AC
  Administered 2015-03-23: 10 mL via INTRAVENOUS
  Filled 2015-03-23: qty 10

## 2015-03-23 MED ORDER — DIPHENHYDRAMINE HCL 50 MG/ML IJ SOLN
INTRAMUSCULAR | Status: AC
  Filled 2015-03-23: qty 1

## 2015-03-23 MED ORDER — FAMOTIDINE IN NACL 20-0.9 MG/50ML-% IV SOLN
20.0000 mg | Freq: Once | INTRAVENOUS | Status: AC
  Administered 2015-03-23: 20 mg via INTRAVENOUS

## 2015-03-23 NOTE — Patient Instructions (Signed)

## 2015-03-23 NOTE — Progress Notes (Signed)
Incision clean and dry-L neck

## 2015-03-23 NOTE — Patient Instructions (Signed)
[facility] Discharge Instructions for Patients Receiving Chemotherapy  Today you received the following chemotherapy agents: Carboplatin and Taxol.  To help prevent nausea and vomiting after your treatment, we encourage you to take your nausea medication:  Compazine 10 mg every 6 hours as needed and Zofran 8 mg every 8 hours as needed.   If you develop nausea and vomiting that is not controlled by your nausea medication, call the clinic.   BELOW ARE SYMPTOMS THAT SHOULD BE REPORTED IMMEDIATELY:  *FEVER GREATER THAN 100.5 F  *CHILLS WITH OR WITHOUT FEVER  NAUSEA AND VOMITING THAT IS NOT CONTROLLED WITH YOUR NAUSEA MEDICATION  *UNUSUAL SHORTNESS OF BREATH  *UNUSUAL BRUISING OR BLEEDING  TENDERNESS IN MOUTH AND THROAT WITH OR WITHOUT PRESENCE OF ULCERS  *URINARY PROBLEMS  *BOWEL PROBLEMS  UNUSUAL RASH Items with * indicate a potential emergency and should be followed up as soon as possible.  Feel free to call the clinic you have any questions or concerns. The clinic phone number is [phone].  Please show the [facility] at check-in to the Emergency Department and triage nurse.

## 2015-03-27 ENCOUNTER — Encounter (HOSPITAL_COMMUNITY): Payer: Medicaid Other

## 2015-03-30 ENCOUNTER — Other Ambulatory Visit (HOSPITAL_BASED_OUTPATIENT_CLINIC_OR_DEPARTMENT_OTHER): Payer: Medicaid Other

## 2015-03-30 ENCOUNTER — Ambulatory Visit: Payer: Medicaid Other | Admitting: Nutrition

## 2015-03-30 ENCOUNTER — Ambulatory Visit (HOSPITAL_BASED_OUTPATIENT_CLINIC_OR_DEPARTMENT_OTHER): Payer: Medicaid Other

## 2015-03-30 ENCOUNTER — Ambulatory Visit: Payer: Medicaid Other

## 2015-03-30 DIAGNOSIS — C099 Malignant neoplasm of tonsil, unspecified: Secondary | ICD-10-CM

## 2015-03-30 DIAGNOSIS — Z95828 Presence of other vascular implants and grafts: Secondary | ICD-10-CM

## 2015-03-30 LAB — CBC WITH DIFFERENTIAL/PLATELET
BASO%: 0.3 % (ref 0.0–2.0)
BASOS ABS: 0 10*3/uL (ref 0.0–0.1)
EOS ABS: 0 10*3/uL (ref 0.0–0.5)
EOS%: 0.8 % (ref 0.0–7.0)
HEMATOCRIT: 36.7 % — AB (ref 38.4–49.9)
HEMOGLOBIN: 11.9 g/dL — AB (ref 13.0–17.1)
LYMPH#: 0.5 10*3/uL — AB (ref 0.9–3.3)
LYMPH%: 14.2 % (ref 14.0–49.0)
MCH: 30.1 pg (ref 27.2–33.4)
MCHC: 32.5 g/dL (ref 32.0–36.0)
MCV: 92.6 fL (ref 79.3–98.0)
MONO#: 0.2 10*3/uL (ref 0.1–0.9)
MONO%: 6.2 % (ref 0.0–14.0)
NEUT%: 78.5 % — ABNORMAL HIGH (ref 39.0–75.0)
NEUTROS ABS: 2.9 10*3/uL (ref 1.5–6.5)
Platelets: 261 10*3/uL (ref 140–400)
RBC: 3.96 10*6/uL — ABNORMAL LOW (ref 4.20–5.82)
RDW: 14.3 % (ref 11.0–14.6)
WBC: 3.6 10*3/uL — AB (ref 4.0–10.3)

## 2015-03-30 LAB — COMPREHENSIVE METABOLIC PANEL
ALBUMIN: 4 g/dL (ref 3.5–5.0)
ALT: 15 U/L (ref 0–55)
AST: 22 U/L (ref 5–34)
Alkaline Phosphatase: 55 U/L (ref 40–150)
Anion Gap: 10 mEq/L (ref 3–11)
BILIRUBIN TOTAL: 0.34 mg/dL (ref 0.20–1.20)
BUN: 17.2 mg/dL (ref 7.0–26.0)
CALCIUM: 9.4 mg/dL (ref 8.4–10.4)
CO2: 24 mEq/L (ref 22–29)
Chloride: 105 mEq/L (ref 98–109)
Creatinine: 1.5 mg/dL — ABNORMAL HIGH (ref 0.7–1.3)
EGFR: 64 mL/min/{1.73_m2} — AB (ref >90)
Glucose: 85 mg/dl (ref 70–140)
Potassium: 4.3 mEq/L (ref 3.5–5.1)
Sodium: 140 mEq/L (ref 136–145)
TOTAL PROTEIN: 7.1 g/dL (ref 6.4–8.3)

## 2015-03-30 MED ORDER — SODIUM CHLORIDE 0.9 % IJ SOLN
10.0000 mL | INTRAMUSCULAR | Status: DC | PRN
  Administered 2015-03-30: 10 mL via INTRAVENOUS
  Filled 2015-03-30: qty 10

## 2015-03-30 NOTE — Progress Notes (Signed)
Pt came to infusion stating that he needed to re-schedule his chemo today d/t fact that a water pipe burst at his home and he needs to leave to get it fixed.  Rescheduled chemo for tomorrow, 03/31/15. Port de-accessed.

## 2015-03-30 NOTE — Progress Notes (Signed)
Brief nutrition follow up with patient.  He had a home emergency and needed to leave. Patient reports he is drinking 2 Ensure daily and would like additional samples. States his best meal is breakfast. No other concerns mentioned. Weight improved and documented as 238.2 pounds improved from 231.1 pounds November 4.  Will continue to follow patient.  He has my contact information.

## 2015-03-31 ENCOUNTER — Ambulatory Visit (HOSPITAL_BASED_OUTPATIENT_CLINIC_OR_DEPARTMENT_OTHER): Payer: Medicaid Other

## 2015-03-31 VITALS — BP 140/82 | HR 97 | Temp 98.7°F | Resp 18

## 2015-03-31 DIAGNOSIS — Z5111 Encounter for antineoplastic chemotherapy: Secondary | ICD-10-CM

## 2015-03-31 DIAGNOSIS — C099 Malignant neoplasm of tonsil, unspecified: Secondary | ICD-10-CM

## 2015-03-31 MED ORDER — HEPARIN SOD (PORK) LOCK FLUSH 100 UNIT/ML IV SOLN
500.0000 [IU] | Freq: Once | INTRAVENOUS | Status: AC | PRN
  Administered 2015-03-31: 500 [IU]
  Filled 2015-03-31: qty 5

## 2015-03-31 MED ORDER — SODIUM CHLORIDE 0.9 % IV SOLN
Freq: Once | INTRAVENOUS | Status: AC
  Administered 2015-03-31: 15:00:00 via INTRAVENOUS

## 2015-03-31 MED ORDER — SODIUM CHLORIDE 0.9 % IV SOLN
10.0000 mg | Freq: Once | INTRAVENOUS | Status: AC
  Administered 2015-03-31: 10 mg via INTRAVENOUS
  Filled 2015-03-31: qty 1

## 2015-03-31 MED ORDER — DIPHENHYDRAMINE HCL 50 MG/ML IJ SOLN
INTRAMUSCULAR | Status: AC
  Filled 2015-03-31: qty 1

## 2015-03-31 MED ORDER — SODIUM CHLORIDE 0.9 % IJ SOLN
10.0000 mL | INTRAMUSCULAR | Status: DC | PRN
  Administered 2015-03-31: 10 mL
  Filled 2015-03-31: qty 10

## 2015-03-31 MED ORDER — FAMOTIDINE IN NACL 20-0.9 MG/50ML-% IV SOLN
INTRAVENOUS | Status: AC
  Filled 2015-03-31: qty 50

## 2015-03-31 MED ORDER — PALONOSETRON HCL INJECTION 0.25 MG/5ML
0.2500 mg | Freq: Once | INTRAVENOUS | Status: AC
  Administered 2015-03-31: 0.25 mg via INTRAVENOUS

## 2015-03-31 MED ORDER — SODIUM CHLORIDE 0.9 % IV SOLN
229.2000 mg | Freq: Once | INTRAVENOUS | Status: AC
  Administered 2015-03-31: 230 mg via INTRAVENOUS
  Filled 2015-03-31: qty 23

## 2015-03-31 MED ORDER — DIPHENHYDRAMINE HCL 50 MG/ML IJ SOLN
50.0000 mg | Freq: Once | INTRAMUSCULAR | Status: AC
  Administered 2015-03-31: 50 mg via INTRAVENOUS

## 2015-03-31 MED ORDER — PALONOSETRON HCL INJECTION 0.25 MG/5ML
INTRAVENOUS | Status: AC
  Filled 2015-03-31: qty 5

## 2015-03-31 MED ORDER — FAMOTIDINE IN NACL 20-0.9 MG/50ML-% IV SOLN
20.0000 mg | Freq: Once | INTRAVENOUS | Status: AC
  Administered 2015-03-31: 20 mg via INTRAVENOUS

## 2015-03-31 MED ORDER — PACLITAXEL CHEMO INJECTION 300 MG/50ML
45.0000 mg/m2 | Freq: Once | INTRAVENOUS | Status: AC
  Administered 2015-03-31: 108 mg via INTRAVENOUS
  Filled 2015-03-31: qty 18

## 2015-03-31 NOTE — Patient Instructions (Signed)
Carboplatin injection What is this medicine? CARBOPLATIN (KAR boe pla tin) is a chemotherapy drug. It targets fast dividing cells, like cancer cells, and causes these cells to die. This medicine is used to treat ovarian cancer and many other cancers. This medicine may be used for other purposes; ask your health care provider or pharmacist if you have questions. What should I tell my health care provider before I take this medicine? They need to know if you have any of these conditions: -blood disorders -hearing problems -kidney disease -recent or ongoing radiation therapy -an unusual or allergic reaction to carboplatin, cisplatin, other chemotherapy, other medicines, foods, dyes, or preservatives -pregnant or trying to get pregnant -breast-feeding How should I use this medicine? This drug is usually given as an infusion into a vein. It is administered in a hospital or clinic by a specially trained health care professional. Talk to your pediatrician regarding the use of this medicine in children. Special care may be needed. Overdosage: If you think you have taken too much of this medicine contact a poison control center or emergency room at once. NOTE: This medicine is only for you. Do not share this medicine with others. What if I miss a dose? It is important not to miss a dose. Call your doctor or health care professional if you are unable to keep an appointment. What may interact with this medicine? -medicines for seizures -medicines to increase blood counts like filgrastim, pegfilgrastim, sargramostim -some antibiotics like amikacin, gentamicin, neomycin, streptomycin, tobramycin -vaccines Talk to your doctor or health care professional before taking any of these medicines: -acetaminophen -aspirin -ibuprofen -ketoprofen -naproxen This list may not describe all possible interactions. Give your health care provider a list of all the medicines, herbs, non-prescription drugs, or dietary  supplements you use. Also tell them if you smoke, drink alcohol, or use illegal drugs. Some items may interact with your medicine. What should I watch for while using this medicine? Your condition will be monitored carefully while you are receiving this medicine. You will need important blood work done while you are taking this medicine. This drug may make you feel generally unwell. This is not uncommon, as chemotherapy can affect healthy cells as well as cancer cells. Report any side effects. Continue your course of treatment even though you feel ill unless your doctor tells you to stop. In some cases, you may be given additional medicines to help with side effects. Follow all directions for their use. Call your doctor or health care professional for advice if you get a fever, chills or sore throat, or other symptoms of a cold or flu. Do not treat yourself. This drug decreases your body's ability to fight infections. Try to avoid being around people who are sick. This medicine may increase your risk to bruise or bleed. Call your doctor or health care professional if you notice any unusual bleeding. Be careful brushing and flossing your teeth or using a toothpick because you may get an infection or bleed more easily. If you have any dental work done, tell your dentist you are receiving this medicine. Avoid taking products that contain aspirin, acetaminophen, ibuprofen, naproxen, or ketoprofen unless instructed by your doctor. These medicines may hide a fever. Do not become pregnant while taking this medicine. Women should inform their doctor if they wish to become pregnant or think they might be pregnant. There is a potential for serious side effects to an unborn child. Talk to your health care professional or pharmacist for more information.   Do not breast-feed an infant while taking this medicine. What side effects may I notice from receiving this medicine? Side effects that you should report to your  doctor or health care professional as soon as possible: -allergic reactions like skin rash, itching or hives, swelling of the face, lips, or tongue -signs of infection - fever or chills, cough, sore throat, pain or difficulty passing urine -signs of decreased platelets or bleeding - bruising, pinpoint red spots on the skin, black, tarry stools, nosebleeds -signs of decreased red blood cells - unusually weak or tired, fainting spells, lightheadedness -breathing problems -changes in hearing -changes in vision -chest pain -high blood pressure -low blood counts - This drug may decrease the number of white blood cells, red blood cells and platelets. You may be at increased risk for infections and bleeding. -nausea and vomiting -pain, swelling, redness or irritation at the injection site -pain, tingling, numbness in the hands or feet -problems with balance, talking, walking -trouble passing urine or change in the amount of urine Side effects that usually do not require medical attention (report to your doctor or health care professional if they continue or are bothersome): -hair loss -loss of appetite -metallic taste in the mouth or changes in taste This list may not describe all possible side effects. Call your doctor for medical advice about side effects. You may report side effects to FDA at 1-800-FDA-1088. Where should I keep my medicine? This drug is given in a hospital or clinic and will not be stored at home. NOTE: This sheet is a summary. It may not cover all possible information. If you have questions about this medicine, talk to your doctor, pharmacist, or health care provider.    2016, Elsevier/Gold Standard. (2007-07-10 14:38:05) Paclitaxel injection What is this medicine? PACLITAXEL (PAK li TAX el) is a chemotherapy drug. It targets fast dividing cells, like cancer cells, and causes these cells to die. This medicine is used to treat ovarian cancer, breast cancer, and other  cancers. This medicine may be used for other purposes; ask your health care provider or pharmacist if you have questions. What should I tell my health care provider before I take this medicine? They need to know if you have any of these conditions: -blood disorders -irregular heartbeat -infection (especially a virus infection such as chickenpox, cold sores, or herpes) -liver disease -previous or ongoing radiation therapy -an unusual or allergic reaction to paclitaxel, alcohol, polyoxyethylated castor oil, other chemotherapy agents, other medicines, foods, dyes, or preservatives -pregnant or trying to get pregnant -breast-feeding How should I use this medicine? This drug is given as an infusion into a vein. It is administered in a hospital or clinic by a specially trained health care professional. Talk to your pediatrician regarding the use of this medicine in children. Special care may be needed. Overdosage: If you think you have taken too much of this medicine contact a poison control center or emergency room at once. NOTE: This medicine is only for you. Do not share this medicine with others. What if I miss a dose? It is important not to miss your dose. Call your doctor or health care professional if you are unable to keep an appointment. What may interact with this medicine? Do not take this medicine with any of the following medications: -disulfiram -metronidazole This medicine may also interact with the following medications: -cyclosporine -diazepam -ketoconazole -medicines to increase blood counts like filgrastim, pegfilgrastim, sargramostim -other chemotherapy drugs like cisplatin, doxorubicin, epirubicin, etoposide, teniposide, vincristine -quinidine -testosterone -  vaccines -verapamil Talk to your doctor or health care professional before taking any of these medicines: -acetaminophen -aspirin -ibuprofen -ketoprofen -naproxen This list may not describe all possible  interactions. Give your health care provider a list of all the medicines, herbs, non-prescription drugs, or dietary supplements you use. Also tell them if you smoke, drink alcohol, or use illegal drugs. Some items may interact with your medicine. What should I watch for while using this medicine? Your condition will be monitored carefully while you are receiving this medicine. You will need important blood work done while you are taking this medicine. This drug may make you feel generally unwell. This is not uncommon, as chemotherapy can affect healthy cells as well as cancer cells. Report any side effects. Continue your course of treatment even though you feel ill unless your doctor tells you to stop. This medicine can cause serious allergic reactions. To reduce your risk you will need to take other medicine(s) before treatment with this medicine. In some cases, you may be given additional medicines to help with side effects. Follow all directions for their use. Call your doctor or health care professional for advice if you get a fever, chills or sore throat, or other symptoms of a cold or flu. Do not treat yourself. This drug decreases your body's ability to fight infections. Try to avoid being around people who are sick. This medicine may increase your risk to bruise or bleed. Call your doctor or health care professional if you notice any unusual bleeding. Be careful brushing and flossing your teeth or using a toothpick because you may get an infection or bleed more easily. If you have any dental work done, tell your dentist you are receiving this medicine. Avoid taking products that contain aspirin, acetaminophen, ibuprofen, naproxen, or ketoprofen unless instructed by your doctor. These medicines may hide a fever. Do not become pregnant while taking this medicine. Women should inform their doctor if they wish to become pregnant or think they might be pregnant. There is a potential for serious side  effects to an unborn child. Talk to your health care professional or pharmacist for more information. Do not breast-feed an infant while taking this medicine. Men are advised not to father a child while receiving this medicine. This product may contain alcohol. Ask your pharmacist or healthcare provider if this medicine contains alcohol. Be sure to tell all healthcare providers you are taking this medicine. Certain medicines, like metronidazole and disulfiram, can cause an unpleasant reaction when taken with alcohol. The reaction includes flushing, headache, nausea, vomiting, sweating, and increased thirst. The reaction can last from 30 minutes to several hours. What side effects may I notice from receiving this medicine? Side effects that you should report to your doctor or health care professional as soon as possible: -allergic reactions like skin rash, itching or hives, swelling of the face, lips, or tongue -low blood counts - This drug may decrease the number of white blood cells, red blood cells and platelets. You may be at increased risk for infections and bleeding. -signs of infection - fever or chills, cough, sore throat, pain or difficulty passing urine -signs of decreased platelets or bleeding - bruising, pinpoint red spots on the skin, black, tarry stools, nosebleeds -signs of decreased red blood cells - unusually weak or tired, fainting spells, lightheadedness -breathing problems -chest pain -high or low blood pressure -mouth sores -nausea and vomiting -pain, swelling, redness or irritation at the injection site -pain, tingling, numbness in the hands or   feet -slow or irregular heartbeat -swelling of the ankle, feet, hands Side effects that usually do not require medical attention (report to your doctor or health care professional if they continue or are bothersome): -bone pain -complete hair loss including hair on your head, underarms, pubic hair, eyebrows, and eyelashes -changes in  the color of fingernails -diarrhea -loosening of the fingernails -loss of appetite -muscle or joint pain -red flush to skin -sweating This list may not describe all possible side effects. Call your doctor for medical advice about side effects. You may report side effects to FDA at 1-800-FDA-1088. Where should I keep my medicine? This drug is given in a hospital or clinic and will not be stored at home. NOTE: This sheet is a summary. It may not cover all possible information. If you have questions about this medicine, talk to your doctor, pharmacist, or health care provider.    2016, Elsevier/Gold Standard. (2014-11-20 13:02:56)  

## 2015-04-03 ENCOUNTER — Ambulatory Visit (HOSPITAL_COMMUNITY): Payer: Medicaid Other

## 2015-04-14 ENCOUNTER — Ambulatory Visit (HOSPITAL_BASED_OUTPATIENT_CLINIC_OR_DEPARTMENT_OTHER): Payer: Medicaid Other

## 2015-04-14 ENCOUNTER — Encounter: Payer: Self-pay | Admitting: Hematology and Oncology

## 2015-04-14 ENCOUNTER — Ambulatory Visit (HOSPITAL_BASED_OUTPATIENT_CLINIC_OR_DEPARTMENT_OTHER): Payer: Medicaid Other | Admitting: Hematology and Oncology

## 2015-04-14 ENCOUNTER — Other Ambulatory Visit: Payer: Self-pay | Admitting: Hematology and Oncology

## 2015-04-14 ENCOUNTER — Other Ambulatory Visit (HOSPITAL_BASED_OUTPATIENT_CLINIC_OR_DEPARTMENT_OTHER): Payer: Medicaid Other

## 2015-04-14 VITALS — BP 147/93 | HR 84 | Temp 98.1°F | Resp 18 | Ht 78.0 in | Wt 243.2 lb

## 2015-04-14 DIAGNOSIS — Z23 Encounter for immunization: Secondary | ICD-10-CM | POA: Diagnosis not present

## 2015-04-14 DIAGNOSIS — C099 Malignant neoplasm of tonsil, unspecified: Secondary | ICD-10-CM

## 2015-04-14 DIAGNOSIS — Z95828 Presence of other vascular implants and grafts: Secondary | ICD-10-CM

## 2015-04-14 DIAGNOSIS — N182 Chronic kidney disease, stage 2 (mild): Secondary | ICD-10-CM | POA: Diagnosis not present

## 2015-04-14 DIAGNOSIS — G620 Drug-induced polyneuropathy: Secondary | ICD-10-CM

## 2015-04-14 DIAGNOSIS — Z5111 Encounter for antineoplastic chemotherapy: Secondary | ICD-10-CM

## 2015-04-14 DIAGNOSIS — R070 Pain in throat: Secondary | ICD-10-CM | POA: Diagnosis not present

## 2015-04-14 DIAGNOSIS — D630 Anemia in neoplastic disease: Secondary | ICD-10-CM | POA: Diagnosis not present

## 2015-04-14 DIAGNOSIS — T451X5A Adverse effect of antineoplastic and immunosuppressive drugs, initial encounter: Secondary | ICD-10-CM

## 2015-04-14 LAB — COMPREHENSIVE METABOLIC PANEL
ALBUMIN: 3.9 g/dL (ref 3.5–5.0)
ALK PHOS: 59 U/L (ref 40–150)
ALT: 20 U/L (ref 0–55)
ANION GAP: 9 meq/L (ref 3–11)
AST: 25 U/L (ref 5–34)
BILIRUBIN TOTAL: 0.45 mg/dL (ref 0.20–1.20)
BUN: 22 mg/dL (ref 7.0–26.0)
CO2: 25 meq/L (ref 22–29)
CREATININE: 1.6 mg/dL — AB (ref 0.7–1.3)
Calcium: 9 mg/dL (ref 8.4–10.4)
Chloride: 107 mEq/L (ref 98–109)
EGFR: 59 mL/min/{1.73_m2} — AB (ref >90)
Glucose: 85 mg/dl (ref 70–140)
Potassium: 4.4 mEq/L (ref 3.5–5.1)
Sodium: 140 mEq/L (ref 136–145)
TOTAL PROTEIN: 7.2 g/dL (ref 6.4–8.3)

## 2015-04-14 LAB — CBC WITH DIFFERENTIAL/PLATELET
BASO%: 0.8 % (ref 0.0–2.0)
Basophils Absolute: 0 10*3/uL (ref 0.0–0.1)
EOS ABS: 0 10*3/uL (ref 0.0–0.5)
EOS%: 0.5 % (ref 0.0–7.0)
HEMATOCRIT: 36.1 % — AB (ref 38.4–49.9)
HEMOGLOBIN: 11.7 g/dL — AB (ref 13.0–17.1)
LYMPH#: 0.6 10*3/uL — AB (ref 0.9–3.3)
LYMPH%: 12.7 % — ABNORMAL LOW (ref 14.0–49.0)
MCH: 30.4 pg (ref 27.2–33.4)
MCHC: 32.5 g/dL (ref 32.0–36.0)
MCV: 93.4 fL (ref 79.3–98.0)
MONO#: 0.4 10*3/uL (ref 0.1–0.9)
MONO%: 9.3 % (ref 0.0–14.0)
NEUT%: 76.7 % — ABNORMAL HIGH (ref 39.0–75.0)
NEUTROS ABS: 3.7 10*3/uL (ref 1.5–6.5)
PLATELETS: 192 10*3/uL (ref 140–400)
RBC: 3.87 10*6/uL — ABNORMAL LOW (ref 4.20–5.82)
RDW: 15.1 % — ABNORMAL HIGH (ref 11.0–14.6)
WBC: 4.8 10*3/uL (ref 4.0–10.3)

## 2015-04-14 MED ORDER — DIPHENHYDRAMINE HCL 50 MG/ML IJ SOLN
INTRAMUSCULAR | Status: AC
  Filled 2015-04-14: qty 1

## 2015-04-14 MED ORDER — DIPHENHYDRAMINE HCL 50 MG/ML IJ SOLN
50.0000 mg | Freq: Once | INTRAMUSCULAR | Status: AC
  Administered 2015-04-14: 50 mg via INTRAVENOUS

## 2015-04-14 MED ORDER — FAMOTIDINE IN NACL 20-0.9 MG/50ML-% IV SOLN
INTRAVENOUS | Status: AC
  Filled 2015-04-14: qty 50

## 2015-04-14 MED ORDER — SODIUM CHLORIDE 0.9 % IV SOLN
10.0000 mg | Freq: Once | INTRAVENOUS | Status: AC
  Administered 2015-04-14: 10 mg via INTRAVENOUS
  Filled 2015-04-14: qty 1

## 2015-04-14 MED ORDER — HEPARIN SOD (PORK) LOCK FLUSH 100 UNIT/ML IV SOLN
500.0000 [IU] | Freq: Once | INTRAVENOUS | Status: AC | PRN
  Administered 2015-04-14: 500 [IU]
  Filled 2015-04-14: qty 5

## 2015-04-14 MED ORDER — SODIUM CHLORIDE 0.9 % IV SOLN
Freq: Once | INTRAVENOUS | Status: AC
  Administered 2015-04-14: 11:00:00 via INTRAVENOUS

## 2015-04-14 MED ORDER — SODIUM CHLORIDE 0.9 % IJ SOLN
10.0000 mL | INTRAMUSCULAR | Status: DC | PRN
  Administered 2015-04-14: 10 mL via INTRAVENOUS
  Filled 2015-04-14: qty 10

## 2015-04-14 MED ORDER — HEPARIN SOD (PORK) LOCK FLUSH 100 UNIT/ML IV SOLN
250.0000 [IU] | Freq: Once | INTRAVENOUS | Status: DC | PRN
  Filled 2015-04-14: qty 5

## 2015-04-14 MED ORDER — INFLUENZA VAC SPLIT QUAD 0.5 ML IM SUSY
0.5000 mL | PREFILLED_SYRINGE | Freq: Once | INTRAMUSCULAR | Status: AC
  Administered 2015-04-14: 0.5 mL via INTRAMUSCULAR
  Filled 2015-04-14: qty 0.5

## 2015-04-14 MED ORDER — PALONOSETRON HCL INJECTION 0.25 MG/5ML
0.2500 mg | Freq: Once | INTRAVENOUS | Status: AC
  Administered 2015-04-14: 0.25 mg via INTRAVENOUS

## 2015-04-14 MED ORDER — PALONOSETRON HCL INJECTION 0.25 MG/5ML
INTRAVENOUS | Status: AC
  Filled 2015-04-14: qty 5

## 2015-04-14 MED ORDER — PACLITAXEL CHEMO INJECTION 300 MG/50ML
45.0000 mg/m2 | Freq: Once | INTRAVENOUS | Status: AC
  Administered 2015-04-14: 108 mg via INTRAVENOUS
  Filled 2015-04-14: qty 18

## 2015-04-14 MED ORDER — SODIUM CHLORIDE 0.9 % IV SOLN
218.0000 mg | Freq: Once | INTRAVENOUS | Status: AC
  Administered 2015-04-14: 220 mg via INTRAVENOUS
  Filled 2015-04-14: qty 22

## 2015-04-14 MED ORDER — OXYCODONE HCL 10 MG PO TABS
10.0000 mg | ORAL_TABLET | Freq: Four times a day (QID) | ORAL | Status: DC | PRN
Start: 2015-04-14 — End: 2015-05-04

## 2015-04-14 MED ORDER — FAMOTIDINE IN NACL 20-0.9 MG/50ML-% IV SOLN
20.0000 mg | Freq: Once | INTRAVENOUS | Status: AC
  Administered 2015-04-14: 20 mg via INTRAVENOUS

## 2015-04-14 NOTE — Patient Instructions (Signed)
[facility] Discharge Instructions for Patients Receiving Chemotherapy  Today you received the following chemotherapy agents: Carboplatin and Taxol.  To help prevent nausea and vomiting after your treatment, we encourage you to take your nausea medication:  Compazine 10 mg every 6 hours as needed and Zofran 8 mg every 8 hours as needed.   If you develop nausea and vomiting that is not controlled by your nausea medication, call the clinic.   BELOW ARE SYMPTOMS THAT SHOULD BE REPORTED IMMEDIATELY:  *FEVER GREATER THAN 100.5 F  *CHILLS WITH OR WITHOUT FEVER  NAUSEA AND VOMITING THAT IS NOT CONTROLLED WITH YOUR NAUSEA MEDICATION  *UNUSUAL SHORTNESS OF BREATH  *UNUSUAL BRUISING OR BLEEDING  TENDERNESS IN MOUTH AND THROAT WITH OR WITHOUT PRESENCE OF ULCERS  *URINARY PROBLEMS  *BOWEL PROBLEMS  UNUSUAL RASH Items with * indicate a potential emergency and should be followed up as soon as possible.  Feel free to call the clinic you have any questions or concerns. The clinic phone number is [phone].  Please show the [facility] at check-in to the Emergency Department and triage nurse.

## 2015-04-14 NOTE — Assessment & Plan Note (Signed)
He has poorly controlled pain with Percocet. I recommend increasing his pain medication to IR morphine to be taking along with Tylenol for pain control.  he felt that IR morphine caused too much nausea and I switched him to oxycodone with good success. I refilled his pain medications

## 2015-04-14 NOTE — Assessment & Plan Note (Signed)
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 After extensive discussion, he was willing to start gabapentin 300 mg. Since I saw him last month, he finds that very helpful  I will continue same dose treatment without further dose adjustment.

## 2015-04-14 NOTE — Patient Instructions (Signed)

## 2015-04-14 NOTE — Progress Notes (Signed)
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 FNA of right level III lymph node was inconclusive for cancer   07/25/2013 Imaging PET scan showed locally advanced disease with abnormal lymphadenopathy in the right axilla   08/06/2013 Procedure CT-guided biopsy of the lymphadenopathy was negative for malignancy   08/15/2013 Surgery Patient has placement of port and feeding tube   08/19/2013 - 09/10/2013 Chemotherapy Patient received chemotherapy with cisplatin. The patient only received 2 doses due to uncontrolled nausea and acute renal failure.   08/19/2013 - 10/15/2013 Radiation Therapy Patient received radiation treatment   08/27/2013 - 08/30/2013 Hospital Admission The patient was admitted to the hospital for uncontrolled nausea vomiting and dehydration.   02/14/2014 Imaging PET/CT scan showed complete response to treatment   03/19/2014 Surgery He had excisional lymph node biopsy from the right neck. Pathology was benign   05/13/2014 Surgery He had removal of Port-A-Cath.   05/22/2014 Imaging Repeat CT scan of the neck show no evidence of disease recurrence.   12/09/2014 Imaging Ct neck without contrast showed persistent abnormalities on the right side of neck,  indeterminate   12/25/2014 Imaging PET CT scan showed disease recurrence.   02/10/2015 Procedure He has placement of port   02/13/2015 -  Chemotherapy He received chemotherapy with carbo/Taxol    INTERVAL HISTORY: Please see below for problem oriented charting. He is seen prior to cycle 7 of treatment. Unfortunately, the PET/CT scan was delayed. He tolerated chemotherapy well. Denies recent nausea or vomiting. He has very mild grade 1 neuropathy in both his hands and feet but they does not bother him. He denies worsening pain on the right side of his neck. He takes oxycodone periodically and it appears to get his pain under good control.  REVIEW OF SYSTEMS:   Constitutional: Denies fevers, chills or abnormal weight loss Eyes: Denies blurriness of vision Ears, nose, mouth, throat, and face: Denies mucositis or sore throat Respiratory: Denies cough, dyspnea or wheezes Cardiovascular: Denies palpitation, chest discomfort or lower extremity swelling Gastrointestinal:  Denies nausea, heartburn or change in bowel habits Skin: Denies abnormal skin rashes Lymphatics: Denies new lymphadenopathy or easy bruising Neurological:Denies numbness, tingling or new weaknesses Behavioral/Psych: Mood is stable, no new changes  All other systems were reviewed with the patient and are negative.  I have reviewed the past medical history, past surgical history, social history and family history with the patient and they are unchanged from previous note.  ALLERGIES:  is allergic to pollen extract.  MEDICATIONS:  Current Outpatient Prescriptions  Medication Sig Dispense Refill  . acetaminophen (TYLENOL) 325 MG tablet Take 325-650 mg by mouth every 6 (six) hours as needed (pain).    . fluticasone (FLONASE) 50 MCG/ACT nasal spray Place 2 sprays into both nostrils daily. [place]  g 2  . lidocaine-prilocaine (EMLA) cream Apply 1 application topically as needed. 30 g 6  . Multiple Vitamin (MULTIVITAMIN WITH  MINERALS) TABS tablet Take 1 tablet by mouth daily.    . ondansetron (ZOFRAN) 8 MG tablet Take 1 tablet (8 mg total) by mouth every 8 (eight) hours as needed for nausea or vomiting. 30 tablet 1  . Oxycodone HCl 10 MG TABS Take 1 tablet (10 mg total) by mouth every 6 (six) hours as needed. 90 tablet 0  . prochlorperazine (COMPAZINE) 10 MG tablet Take 1 tablet (10 mg total) by mouth every 6 (six) hours as needed (Nausea or vomiting). 30 tablet 1   No current facility-administered medications for this visit.    PHYSICAL EXAMINATION: ECOG PERFORMANCE STATUS: 1 - Symptomatic but completely ambulatory  Filed Vitals:   04/14/15 0952  BP: 147/93  Pulse: 84  Temp: 98.1 F (36.7 C)  Resp: 18   Filed Weights   04/14/15 0952  Weight: 243 lb 3.2 oz (110.315 kg)    GENERAL:alert, no distress and comfortable SKIN: skin color, texture, turgor are normal, no rashes or significant lesions EYES: normal, Conjunctiva are pink and non-injected, sclera clear OROPHARYNX:no exudate, no erythema and lips, buccal mucosa, and tongue normal  NECK: Well-healed surgical scar on the right side of the neck, unchanged compared to prior visit  LYMPH:  no palpable lymphadenopathy in the cervical, axillary or inguinal LUNGS: clear to auscultation and percussion with normal breathing effort HEART: regular rate & rhythm and no murmurs and no lower extremity edema ABDOMEN:abdomen soft, non-tender and normal bowel sounds Musculoskeletal:no cyanosis of digits and no clubbing  NEURO: alert & oriented x 3 with fluent speech, no focal motor/sensory deficits  LABORATORY DATA:  I have reviewed the data as listed    Component Value Date/Time   NA 140 04/14/2015 0854   NA 140 05/12/2014 1438   K 4.4 04/14/2015 0854   K 4.2 05/12/2014 1438   CL 104 05/12/2014 1438   CO2 25 04/14/2015 0854   CO2 30 05/12/2014 1438   GLUCOSE 85 04/14/2015 0854   GLUCOSE 84 05/12/2014 1438   BUN 22.0 04/14/2015 0854   BUN 8 05/12/2014  1438   CREATININE 1.6* 04/14/2015 0854   CREATININE 1.31 05/12/2014 1438   CALCIUM 9.0 04/14/2015 0854   CALCIUM 9.3 05/12/2014 1438   PROT 7.2 04/14/2015 0854   PROT 7.5 09/30/2013 0522   ALBUMIN 3.9 04/14/2015 0854   ALBUMIN 3.4* 09/30/2013 0522   AST 25 04/14/2015 0854   AST 24 09/30/2013 0522   ALT 20 04/14/2015 0854   ALT 43 09/30/2013 0522   ALKPHOS 59 04/14/2015 0854   ALKPHOS 101 09/30/2013 0522   BILITOT 0.45 04/14/2015 0854   BILITOT 0.3 09/30/2013 0522   GFRNONAA 64* 05/12/2014 1438   GFRAA 74* 05/12/2014 1438    No results found for: SPEP, UPEP  Lab Results  Component Value Date   WBC 4.8 04/14/2015   NEUTROABS 3.7 04/14/2015   HGB 11.7* 04/14/2015   HCT 36.1* 04/14/2015   MCV 93.4 04/14/2015   PLT 192 04/14/2015      Chemistry      Component Value Date/Time   NA 140 04/14/2015 0854   NA 140 05/12/2014 1438   K 4.4 04/14/2015 0854   K 4.2 05/12/2014 1438   CL 104 05/12/2014 1438   CO2 25 04/14/2015 0854   CO2 30 05/12/2014 1438   BUN 22.0 04/14/2015 0854   BUN 8 05/12/2014 1438  CREATININE 1.6* 04/14/2015 0854   CREATININE 1.31 05/12/2014 1438      Component Value Date/Time   CALCIUM 9.0 04/14/2015 0854   CALCIUM 9.3 05/12/2014 1438   ALKPHOS 59 04/14/2015 0854   ALKPHOS 101 09/30/2013 0522   AST 25 04/14/2015 0854   AST 24 09/30/2013 0522   ALT 20 04/14/2015 0854   ALT 43 09/30/2013 0522   BILITOT 0.45 04/14/2015 0854   BILITOT 0.3 09/30/2013 0522      ASSESSMENT & PLAN:  Tonsillar cancer He tolerated treatment # 1 poorly and had nausea & vomiting for a few days I recommend keeping treatment at same dose but then modified his anti-emetics regimen He tolerated that better. Unfortunately, it takes longer to get his PET CT scan approved. I will proceed with treatment today and the PET CT scan is scheduled for next week.  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 The patient denies recent history of bleeding  such as epistaxis, hematuria or hematochezia. He is asymptomatic from the anemia. We will observe for now.    Chronic kidney disease (CKD), stage II (mild) His kidney function is stable. As long as his creatinine clearance remain greater than 30, we will continue chemotherapy.    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 After extensive discussion, he was willing to start gabapentin 300 mg. Since I saw him last month, he finds that very helpful  I will continue same dose treatment without further dose adjustment.  Throat pain in adult He has poorly controlled pain with Percocet. I recommend increasing his pain medication to IR morphine to be taking along with Tylenol for pain control.  he felt that IR morphine caused too much nausea and I switched him to oxycodone with good success. I refilled his pain medications   No orders of the defined types were placed in this encounter.   All questions were answered. The patient knows to call the clinic with any problems, questions or concerns. No barriers to learning was detected. I spent 25 minutes counseling the patient face to face. The total time spent in the appointment was 30 minutes and more than 50% was on counseling and review of test results     [facility], [place], MD 04/14/2015 10:32 AM

## 2015-04-14 NOTE — Assessment & Plan Note (Signed)
His kidney function is stable. As long as his creatinine clearance remain greater than 30, we will continue chemotherapy. 

## 2015-04-14 NOTE — Assessment & Plan Note (Signed)
He tolerated treatment # 1 poorly and had nausea & vomiting for a few days I recommend keeping treatment at same dose but then modified his anti-emetics regimen He tolerated that better. Unfortunately, it takes longer to get his PET CT scan approved. I will proceed with treatment today and the PET CT scan is scheduled for next week.

## 2015-04-14 NOTE — Assessment & Plan Note (Signed)
This is likely anemia of chronic disease. The patient denies recent history of bleeding such as epistaxis, hematuria or hematochezia. He is asymptomatic from the anemia. We will observe for now.  

## 2015-04-21 ENCOUNTER — Other Ambulatory Visit: Payer: Self-pay

## 2015-04-21 ENCOUNTER — Encounter (HOSPITAL_COMMUNITY)
Admission: RE | Admit: 2015-04-21 | Discharge: 2015-04-21 | Disposition: A | Discharge status: Home / Self Care | Payer: Medicaid Other | Source: Ambulatory Visit | Attending: Hematology and Oncology | Admitting: Hematology and Oncology

## 2015-04-21 ENCOUNTER — Telehealth: Payer: Self-pay | Admitting: *Deleted

## 2015-04-21 ENCOUNTER — Encounter: Payer: Self-pay | Admitting: *Deleted

## 2015-04-21 ENCOUNTER — Other Ambulatory Visit (HOSPITAL_BASED_OUTPATIENT_CLINIC_OR_DEPARTMENT_OTHER): Payer: Medicaid Other

## 2015-04-21 ENCOUNTER — Ambulatory Visit: Payer: Medicaid Other

## 2015-04-21 ENCOUNTER — Ambulatory Visit (HOSPITAL_BASED_OUTPATIENT_CLINIC_OR_DEPARTMENT_OTHER): Payer: Medicaid Other

## 2015-04-21 ENCOUNTER — Ambulatory Visit: Payer: Medicaid Other | Admitting: Nutrition

## 2015-04-21 VITALS — BP 153/88 | HR 79 | Temp 99.0°F | Resp 20

## 2015-04-21 DIAGNOSIS — Z5111 Encounter for antineoplastic chemotherapy: Secondary | ICD-10-CM | POA: Diagnosis present

## 2015-04-21 DIAGNOSIS — Z95828 Presence of other vascular implants and grafts: Secondary | ICD-10-CM

## 2015-04-21 DIAGNOSIS — C099 Malignant neoplasm of tonsil, unspecified: Secondary | ICD-10-CM

## 2015-04-21 LAB — CBC WITH DIFFERENTIAL/PLATELET
BASO%: 0.5 % (ref 0.0–2.0)
BASOS ABS: 0 10*3/uL (ref 0.0–0.1)
EOS ABS: 0.1 10*3/uL (ref 0.0–0.5)
EOS%: 0.7 % (ref 0.0–7.0)
HEMATOCRIT: 35.6 % — AB (ref 38.4–49.9)
HEMOGLOBIN: 11.7 g/dL — AB (ref 13.0–17.1)
LYMPH#: 0.7 10*3/uL — AB (ref 0.9–3.3)
LYMPH%: 9.2 % — ABNORMAL LOW (ref 14.0–49.0)
MCH: 30.7 pg (ref 27.2–33.4)
MCHC: 33 g/dL (ref 32.0–36.0)
MCV: 93.1 fL (ref 79.3–98.0)
MONO#: 0.4 10*3/uL (ref 0.1–0.9)
MONO%: 5.4 % (ref 0.0–14.0)
NEUT%: 84.2 % — ABNORMAL HIGH (ref 39.0–75.0)
NEUTROS ABS: 6.4 10*3/uL (ref 1.5–6.5)
Platelets: 155 10*3/uL (ref 140–400)
RBC: 3.82 10*6/uL — ABNORMAL LOW (ref 4.20–5.82)
RDW: 15.4 % — AB (ref 11.0–14.6)
WBC: 7.6 10*3/uL (ref 4.0–10.3)

## 2015-04-21 LAB — COMPREHENSIVE METABOLIC PANEL
ALBUMIN: 4.1 g/dL (ref 3.5–5.0)
ALK PHOS: 49 U/L (ref 40–150)
ALT: 18 U/L (ref 0–55)
AST: 27 U/L (ref 5–34)
Anion Gap: 7 mEq/L (ref 3–11)
BUN: 18 mg/dL (ref 7.0–26.0)
CHLORIDE: 107 meq/L (ref 98–109)
CO2: 26 mEq/L (ref 22–29)
Calcium: 9.3 mg/dL (ref 8.4–10.4)
Creatinine: 1.5 mg/dL — ABNORMAL HIGH (ref 0.7–1.3)
EGFR: 65 mL/min/{1.73_m2} — AB (ref >90)
GLUCOSE: 97 mg/dL (ref 70–140)
POTASSIUM: 4.9 meq/L (ref 3.5–5.1)
SODIUM: 140 meq/L (ref 136–145)
Total Bilirubin: 0.44 mg/dL (ref 0.20–1.20)
Total Protein: 7.3 g/dL (ref 6.4–8.3)

## 2015-04-21 MED ORDER — DIPHENHYDRAMINE HCL 50 MG/ML IJ SOLN
50.0000 mg | Freq: Once | INTRAMUSCULAR | Status: AC
  Administered 2015-04-21: 50 mg via INTRAVENOUS

## 2015-04-21 MED ORDER — SODIUM CHLORIDE 0.9 % IV SOLN
Freq: Once | INTRAVENOUS | Status: AC
  Administered 2015-04-21: 10:00:00 via INTRAVENOUS

## 2015-04-21 MED ORDER — FAMOTIDINE IN NACL 20-0.9 MG/50ML-% IV SOLN
INTRAVENOUS | Status: AC
  Filled 2015-04-21: qty 50

## 2015-04-21 MED ORDER — FAMOTIDINE IN NACL 20-0.9 MG/50ML-% IV SOLN
20.0000 mg | Freq: Once | INTRAVENOUS | Status: AC
  Administered 2015-04-21: 20 mg via INTRAVENOUS

## 2015-04-21 MED ORDER — DIPHENHYDRAMINE HCL 50 MG/ML IJ SOLN
INTRAMUSCULAR | Status: AC
  Filled 2015-04-21: qty 1

## 2015-04-21 MED ORDER — SODIUM CHLORIDE 0.9 % IV SOLN
229.2000 mg | Freq: Once | INTRAVENOUS | Status: AC
  Administered 2015-04-21: 230 mg via INTRAVENOUS
  Filled 2015-04-21: qty 23

## 2015-04-21 MED ORDER — SODIUM CHLORIDE 0.9 % IV SOLN
10.0000 mg | Freq: Once | INTRAVENOUS | Status: AC
  Administered 2015-04-21: 10 mg via INTRAVENOUS
  Filled 2015-04-21: qty 1

## 2015-04-21 MED ORDER — PALONOSETRON HCL INJECTION 0.25 MG/5ML
INTRAVENOUS | Status: AC
  Filled 2015-04-21: qty 5

## 2015-04-21 MED ORDER — SODIUM CHLORIDE 0.9 % IJ SOLN
10.0000 mL | INTRAMUSCULAR | Status: DC | PRN
  Administered 2015-04-21: 10 mL
  Filled 2015-04-21: qty 10

## 2015-04-21 MED ORDER — HEPARIN SOD (PORK) LOCK FLUSH 100 UNIT/ML IV SOLN
500.0000 [IU] | Freq: Once | INTRAVENOUS | Status: AC | PRN
  Administered 2015-04-21: 500 [IU]
  Filled 2015-04-21: qty 5

## 2015-04-21 MED ORDER — PACLITAXEL CHEMO INJECTION 300 MG/50ML
45.0000 mg/m2 | Freq: Once | INTRAVENOUS | Status: AC
  Administered 2015-04-21: 108 mg via INTRAVENOUS
  Filled 2015-04-21: qty 18

## 2015-04-21 MED ORDER — FLUDEOXYGLUCOSE F - 18 (FDG) INJECTION
11.8900 | Freq: Once | INTRAVENOUS | Status: AC | PRN
  Administered 2015-04-21: 11.89 via INTRAVENOUS

## 2015-04-21 MED ORDER — SODIUM CHLORIDE 0.9 % IJ SOLN
10.0000 mL | INTRAMUSCULAR | Status: DC | PRN
  Administered 2015-04-21: 10 mL via INTRAVENOUS
  Filled 2015-04-21: qty 10

## 2015-04-21 MED ORDER — PALONOSETRON HCL INJECTION 0.25 MG/5ML
0.2500 mg | Freq: Once | INTRAVENOUS | Status: AC
  Administered 2015-04-21: 0.25 mg via INTRAVENOUS

## 2015-04-21 NOTE — Progress Notes (Signed)
  Oncology Nurse Navigator Documentation   Navigator Encounter Type: Clinic/MDC (04/21/15 1035) Patient Visit Type: Medonc;Follow-up (04/21/15 1035)       To provide support and encouragement, care continuity and to assess for needs, I briefly met with Mr Mclaren in Infusion where he was receiving next cycle of Carbo/Taxol. He presented with a positive outlook/attitude. He did not express any needs or concerns at this time, I encouraged him to contact me if that changes, he verbalized understanding.  Gayleen Orem, RN, BSN, [place] at [place] [phone]                   Time Spent with Patient: 15 (04/21/15 1035)

## 2015-04-21 NOTE — Patient Instructions (Signed)
[facility] Discharge Instructions for Patients Receiving Chemotherapy  Today you received the following chemotherapy agents Taxol and Carboplatin. To help prevent nausea and vomiting after your treatment, we encourage you to take your nausea medication as directed.  If you develop nausea and vomiting that is not controlled by your nausea medication, call the clinic.   BELOW ARE SYMPTOMS THAT SHOULD BE REPORTED IMMEDIATELY:  *FEVER GREATER THAN 100.5 F  *CHILLS WITH OR WITHOUT FEVER  NAUSEA AND VOMITING THAT IS NOT CONTROLLED WITH YOUR NAUSEA MEDICATION  *UNUSUAL SHORTNESS OF BREATH  *UNUSUAL BRUISING OR BLEEDING  TENDERNESS IN MOUTH AND THROAT WITH OR WITHOUT PRESENCE OF ULCERS  *URINARY PROBLEMS  *BOWEL PROBLEMS  UNUSUAL RASH Items with * indicate a potential emergency and should be followed up as soon as possible.  Feel free to call the clinic you have any questions or concerns. The clinic phone number is [phone].  Please show the CHEMO ALERT CARD at check-in to the Emergency Department and triage nurse.    

## 2015-04-21 NOTE — Telephone Encounter (Signed)
Per patient request moved appts from 1/10 to 1/9. Patient aware

## 2015-04-21 NOTE — Progress Notes (Signed)
Nutrition follow-up completed with patient during infusion. Weight has improved and was documented as 243.2 pounds, December 27. Patient denies nutrition impact symptoms however he expresses financial concerns. He reports he would like to continue to drink Ensure Plus.  Nutrition diagnosis: Inadequate oral intake has improved.  Intervention: I educated patient to work to increase low-cost, high nutrient dense foods and reviewed a list of foods with him. Provided additional samples of Ensure Plus. Encouraged patient to continue to work with social workers and Mining engineer. Teach back method used.  Monitoring, evaluation, goals: Patient will tolerate adequate calories and protein with Ensure Plus to minimize weight loss.  Next visit: To be followed as needed.  **Disclaimer: This note was dictated with voice recognition software. Similar sounding words can inadvertently be transcribed and this note may contain transcription errors which may not have been corrected upon publication of note.**

## 2015-04-21 NOTE — Patient Instructions (Signed)

## 2015-04-22 LAB — GLUCOSE, CAPILLARY: Glucose-Capillary: 95 mg/dL (ref 65–99)

## 2015-04-23 ENCOUNTER — Telehealth: Payer: Self-pay | Admitting: Hematology and Oncology

## 2015-04-23 LAB — GLUCOSE, CAPILLARY: Glucose-Capillary: 95 mg/dL (ref 65–99)

## 2015-04-23 NOTE — Telephone Encounter (Signed)
Left message for patient confirming 1/9 appointments - appointments completed by AR.

## 2015-04-27 ENCOUNTER — Telehealth: Payer: Self-pay | Admitting: *Deleted

## 2015-04-27 ENCOUNTER — Ambulatory Visit (HOSPITAL_BASED_OUTPATIENT_CLINIC_OR_DEPARTMENT_OTHER): Payer: Medicaid Other

## 2015-04-27 ENCOUNTER — Other Ambulatory Visit (HOSPITAL_BASED_OUTPATIENT_CLINIC_OR_DEPARTMENT_OTHER): Payer: Medicaid Other

## 2015-04-27 ENCOUNTER — Encounter: Payer: Self-pay | Admitting: Hematology and Oncology

## 2015-04-27 ENCOUNTER — Ambulatory Visit (HOSPITAL_BASED_OUTPATIENT_CLINIC_OR_DEPARTMENT_OTHER): Payer: Medicaid Other | Admitting: Hematology and Oncology

## 2015-04-27 ENCOUNTER — Telehealth: Payer: Self-pay | Admitting: Hematology and Oncology

## 2015-04-27 VITALS — BP 160/85 | HR 79 | Temp 98.3°F | Resp 18 | Ht 78.0 in | Wt 250.6 lb

## 2015-04-27 DIAGNOSIS — C099 Malignant neoplasm of tonsil, unspecified: Secondary | ICD-10-CM

## 2015-04-27 DIAGNOSIS — N182 Chronic kidney disease, stage 2 (mild): Secondary | ICD-10-CM | POA: Diagnosis not present

## 2015-04-27 DIAGNOSIS — D61810 Antineoplastic chemotherapy induced pancytopenia: Secondary | ICD-10-CM | POA: Diagnosis not present

## 2015-04-27 DIAGNOSIS — T451X5A Adverse effect of antineoplastic and immunosuppressive drugs, initial encounter: Secondary | ICD-10-CM

## 2015-04-27 DIAGNOSIS — Z5111 Encounter for antineoplastic chemotherapy: Secondary | ICD-10-CM | POA: Diagnosis not present

## 2015-04-27 DIAGNOSIS — R112 Nausea with vomiting, unspecified: Secondary | ICD-10-CM | POA: Diagnosis not present

## 2015-04-27 LAB — CBC WITH DIFFERENTIAL/PLATELET
BASO%: 0.2 % (ref 0.0–2.0)
Basophils Absolute: 0 10*3/uL (ref 0.0–0.1)
EOS%: 1.1 % (ref 0.0–7.0)
Eosinophils Absolute: 0.1 10*3/uL (ref 0.0–0.5)
HEMATOCRIT: 35.7 % — AB (ref 38.4–49.9)
HEMOGLOBIN: 11.6 g/dL — AB (ref 13.0–17.1)
LYMPH#: 0.5 10*3/uL — AB (ref 0.9–3.3)
LYMPH%: 10.4 % — ABNORMAL LOW (ref 14.0–49.0)
MCH: 30.6 pg (ref 27.2–33.4)
MCHC: 32.5 g/dL (ref 32.0–36.0)
MCV: 94.2 fL (ref 79.3–98.0)
MONO#: 0.2 10*3/uL (ref 0.1–0.9)
MONO%: 5.4 % (ref 0.0–14.0)
NEUT%: 82.9 % — ABNORMAL HIGH (ref 39.0–75.0)
NEUTROS ABS: 3.7 10*3/uL (ref 1.5–6.5)
Platelets: 133 10*3/uL — ABNORMAL LOW (ref 140–400)
RBC: 3.79 10*6/uL — ABNORMAL LOW (ref 4.20–5.82)
RDW: 14.8 % — ABNORMAL HIGH (ref 11.0–14.6)
WBC: 4.4 10*3/uL (ref 4.0–10.3)

## 2015-04-27 LAB — COMPREHENSIVE METABOLIC PANEL
ALK PHOS: 51 U/L (ref 40–150)
ALT: 18 U/L (ref 0–55)
AST: 24 U/L (ref 5–34)
Albumin: 4.1 g/dL (ref 3.5–5.0)
Anion Gap: 9 mEq/L (ref 3–11)
BUN: 15.9 mg/dL (ref 7.0–26.0)
CALCIUM: 9.1 mg/dL (ref 8.4–10.4)
CHLORIDE: 106 meq/L (ref 98–109)
CO2: 25 meq/L (ref 22–29)
Creatinine: 1.5 mg/dL — ABNORMAL HIGH (ref 0.7–1.3)
EGFR: 63 mL/min/{1.73_m2} — AB (ref >90)
GLUCOSE: 76 mg/dL (ref 70–140)
POTASSIUM: 4.3 meq/L (ref 3.5–5.1)
SODIUM: 140 meq/L (ref 136–145)
Total Bilirubin: 0.69 mg/dL (ref 0.20–1.20)
Total Protein: 7.2 g/dL (ref 6.4–8.3)

## 2015-04-27 MED ORDER — SODIUM CHLORIDE 0.9 % IV SOLN
10.0000 mg | Freq: Once | INTRAVENOUS | Status: AC
  Administered 2015-04-27: 10 mg via INTRAVENOUS
  Filled 2015-04-27: qty 1

## 2015-04-27 MED ORDER — PALONOSETRON HCL INJECTION 0.25 MG/5ML
0.2500 mg | Freq: Once | INTRAVENOUS | Status: AC
  Administered 2015-04-27: 0.25 mg via INTRAVENOUS

## 2015-04-27 MED ORDER — PACLITAXEL CHEMO INJECTION 300 MG/50ML
45.0000 mg/m2 | Freq: Once | INTRAVENOUS | Status: AC
  Administered 2015-04-27: 108 mg via INTRAVENOUS
  Filled 2015-04-27: qty 18

## 2015-04-27 MED ORDER — PALONOSETRON HCL INJECTION 0.25 MG/5ML
INTRAVENOUS | Status: AC
  Filled 2015-04-27: qty 5

## 2015-04-27 MED ORDER — DIPHENHYDRAMINE HCL 50 MG/ML IJ SOLN
INTRAMUSCULAR | Status: AC
  Filled 2015-04-27: qty 1

## 2015-04-27 MED ORDER — SODIUM CHLORIDE 0.9 % IV SOLN
229.2000 mg | Freq: Once | INTRAVENOUS | Status: AC
  Administered 2015-04-27: 230 mg via INTRAVENOUS
  Filled 2015-04-27: qty 23

## 2015-04-27 MED ORDER — SODIUM CHLORIDE 0.9 % IV SOLN
Freq: Once | INTRAVENOUS | Status: AC
  Administered 2015-04-27: 09:00:00 via INTRAVENOUS

## 2015-04-27 MED ORDER — FAMOTIDINE IN NACL 20-0.9 MG/50ML-% IV SOLN
20.0000 mg | Freq: Once | INTRAVENOUS | Status: AC
  Administered 2015-04-27: 20 mg via INTRAVENOUS

## 2015-04-27 MED ORDER — DIPHENHYDRAMINE HCL 50 MG/ML IJ SOLN
50.0000 mg | Freq: Once | INTRAMUSCULAR | Status: AC
  Administered 2015-04-27: 50 mg via INTRAVENOUS

## 2015-04-27 MED ORDER — FAMOTIDINE IN NACL 20-0.9 MG/50ML-% IV SOLN
INTRAVENOUS | Status: AC
  Filled 2015-04-27: qty 50

## 2015-04-27 NOTE — Patient Instructions (Signed)
[facility] Discharge Instructions for Patients Receiving Chemotherapy  Today you received the following chemotherapy agents:  Taxol, Carboplatin To help prevent nausea and vomiting after your treatment, we encourage you to take your nausea medication.   If you develop nausea and vomiting that is not controlled by your nausea medication, call the clinic.   BELOW ARE SYMPTOMS THAT SHOULD BE REPORTED IMMEDIATELY:  *FEVER GREATER THAN 100.5 F  *CHILLS WITH OR WITHOUT FEVER  NAUSEA AND VOMITING THAT IS NOT CONTROLLED WITH YOUR NAUSEA MEDICATION  *UNUSUAL SHORTNESS OF BREATH  *UNUSUAL BRUISING OR BLEEDING  TENDERNESS IN MOUTH AND THROAT WITH OR WITHOUT PRESENCE OF ULCERS  *URINARY PROBLEMS  *BOWEL PROBLEMS  UNUSUAL RASH Items with * indicate a potential emergency and should be followed up as soon as possible.  Feel free to call the clinic you have any questions or concerns. The clinic phone number is [phone].  Please show the CHEMO ALERT CARD at check-in to the Emergency Department and triage nurse.   

## 2015-04-27 NOTE — Assessment & Plan Note (Signed)
This is likely due to recent treatment. The patient denies recent history of bleeding such as epistaxis, hematuria or hematochezia. He is asymptomatic from the low platelet count. I will observe for now.  he does not require transfusion now. I will continue the chemotherapy at current dose without dosage adjustment.  If the thrombocytopenia gets progressive worse in the future, I might have to delay his treatment or adjust the chemotherapy dose.   

## 2015-04-27 NOTE — Progress Notes (Signed)
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 FNA of right level III lymph node was inconclusive for cancer   07/25/2013 Imaging PET scan showed locally advanced disease with abnormal lymphadenopathy in the right axilla   08/06/2013 Procedure CT-guided biopsy of the lymphadenopathy was negative for malignancy   08/15/2013 Surgery Patient has placement of port and feeding tube   08/19/2013 - 09/10/2013 Chemotherapy Patient received chemotherapy with cisplatin. The patient only received 2 doses due to uncontrolled nausea and acute renal failure.   08/19/2013 - 10/15/2013 Radiation Therapy Patient received radiation treatment   08/27/2013 - 08/30/2013 Hospital Admission The patient was admitted to the hospital for uncontrolled nausea vomiting and dehydration.   02/14/2014 Imaging PET/CT scan showed complete response to treatment   03/19/2014 Surgery He had excisional lymph node biopsy from the right neck. Pathology was benign   05/13/2014 Surgery He had removal of Port-A-Cath.   05/22/2014 Imaging Repeat CT scan of the neck show no evidence of disease recurrence.   12/09/2014 Imaging Ct neck without contrast showed persistent abnormalities on the right side of neck,  indeterminate   12/25/2014 Imaging PET CT scan showed disease recurrence.   02/10/2015 Procedure He has placement of port   02/13/2015 -  Chemotherapy He received chemotherapy with carbo/Taxol   04/21/2015 Imaging PET CT scan showed improved disease control    INTERVAL HISTORY: Please see below for problem oriented charting. He returns for further follow-up. He is doing well up off from nausea vomiting with last cycle of treatment that resolved conservatively. He has occasional discharge from his right neck wound from picking on the scab. Denies peripheral neuropathy.  REVIEW OF SYSTEMS:   Constitutional: Denies fevers, chills or abnormal weight loss Eyes: Denies blurriness of vision Ears, nose, mouth, throat, and face: Denies mucositis or sore throat Respiratory: Denies cough, dyspnea or wheezes Cardiovascular: Denies palpitation, chest discomfort or lower extremity swelling Lymphatics: Denies new lymphadenopathy or easy bruising Neurological:Denies numbness, tingling or new weaknesses Behavioral/Psych: Mood is stable, no new changes  All other systems were reviewed with the patient and are negative.  I have reviewed the past medical history, past surgical history, social history and family history with the patient and they are unchanged from previous note.  ALLERGIES:  is allergic to pollen extract.  MEDICATIONS:  Current Outpatient Prescriptions  Medication Sig Dispense Refill  . acetaminophen (TYLENOL) 325 MG tablet Take 325-650 mg by mouth every 6 (six) hours as needed (pain).    . fluticasone (FLONASE) 50 MCG/ACT nasal spray Place 2 sprays into both nostrils daily. 16 g 2  . lidocaine-prilocaine (EMLA) cream Apply 1 application topically as needed. 30 g 6  . Multiple Vitamin (MULTIVITAMIN WITH MINERALS) TABS tablet Take 1 tablet by mouth daily.    Marland Kitchen  ondansetron (ZOFRAN) 8 MG tablet Take 1 tablet (8 mg total) by mouth every 8 (eight) hours as needed for nausea or vomiting. 30  tablet 1  . Oxycodone HCl 10 MG TABS Take 1 tablet (10 mg total) by mouth every 6 (six) hours as needed. 90 tablet 0  . prochlorperazine (COMPAZINE) 10 MG tablet Take 1 tablet (10 mg total) by mouth every 6 (six) hours as needed (Nausea or vomiting). 30 tablet 1   No current facility-administered medications for this visit.   Facility-Administered Medications Ordered in Other Visits  Medication Dose Route Frequency Provider Last Rate Last Dose  . CARBOplatin (PARAPLATIN) 230 mg in sodium chloride 0.9 % 100 mL chemo infusion  230 mg Intravenous Once [name], MD      . PACLitaxel (TAXOL) 108 mg in dextrose 5 % 250 mL chemo infusion (</= 80mg /m2)  45 mg/m2 (Treatment Plan Actual) Intravenous Once [name], MD        PHYSICAL EXAMINATION: ECOG PERFORMANCE STATUS: 1 - Symptomatic but completely ambulatory  Filed Vitals:   04/27/15 0845  BP: 160/85  Pulse: 79  Temp: 98.3 F (36.8 C)  Resp: 18   Filed Weights   04/27/15 0845  Weight: 250 lb 9.6 oz (113.671 kg)    GENERAL:alert, no distress and comfortable SKIN: He has a small wound over the right side of the neck without open discharge  EYES: normal, Conjunctiva are pink and non-injected, sclera clear OROPHARYNX:no exudate, no erythema and lips, buccal mucosa, and tongue normal  NECK: supple, thyroid normal size, non-tender, without nodularity LYMPH:  no palpable lymphadenopathy in the cervical, axillary or inguinal LUNGS: clear to auscultation and percussion with normal breathing effort HEART: regular rate & rhythm and no murmurs and no lower extremity edema ABDOMEN:abdomen soft, non-tender and normal bowel sounds Musculoskeletal:no cyanosis of digits and no clubbing  NEURO: alert & oriented x 3 with fluent speech, no focal motor/sensory deficits  LABORATORY DATA:  I have reviewed the data as listed    Component Value Date/Time   NA 140 04/27/2015 0820   NA 140 05/12/2014 1438   K 4.3 04/27/2015 0820   K 4.2 05/12/2014  1438   CL 104 05/12/2014 1438   CO2 25 04/27/2015 0820   CO2 30 05/12/2014 1438   GLUCOSE 76 04/27/2015 0820   GLUCOSE 84 05/12/2014 1438   BUN 15.9 04/27/2015 0820   BUN 8 05/12/2014 1438   CREATININE 1.5* 04/27/2015 0820   CREATININE 1.31 05/12/2014 1438   CALCIUM 9.1 04/27/2015 0820   CALCIUM 9.3 05/12/2014 1438   PROT 7.2 04/27/2015 0820   PROT 7.5 09/30/2013 0522   ALBUMIN 4.1 04/27/2015 0820   ALBUMIN 3.4* 09/30/2013 0522   AST 24 04/27/2015 0820   AST 24 09/30/2013 0522   ALT 18 04/27/2015 0820   ALT 43 09/30/2013 0522   ALKPHOS 51 04/27/2015 0820   ALKPHOS 101 09/30/2013 0522   BILITOT 0.69 04/27/2015 0820   BILITOT 0.3 09/30/2013 0522   GFRNONAA 64* 05/12/2014 1438   GFRAA 74* 05/12/2014 1438    No results found for: SPEP, UPEP  Lab Results  Component Value Date   WBC 4.4 04/27/2015   NEUTROABS 3.7 04/27/2015   HGB 11.6* 04/27/2015   HCT 35.7* 04/27/2015   MCV 94.2 04/27/2015   PLT 133* 04/27/2015      Chemistry      Component Value Date/Time   NA 140 04/27/2015 0820   NA 140 05/12/2014 1438   K 4.3 04/27/2015  0820   K 4.2 05/12/2014 1438   CL 104 05/12/2014 1438   CO2 25 04/27/2015 0820   CO2 30 05/12/2014 1438   BUN 15.9 04/27/2015 0820   BUN 8 05/12/2014 1438   CREATININE 1.5* 04/27/2015 0820   CREATININE 1.31 05/12/2014 1438      Component Value Date/Time   CALCIUM 9.1 04/27/2015 0820   CALCIUM 9.3 05/12/2014 1438   ALKPHOS 51 04/27/2015 0820   ALKPHOS 101 09/30/2013 0522   AST 24 04/27/2015 0820   AST 24 09/30/2013 0522   ALT 18 04/27/2015 0820   ALT 43 09/30/2013 0522   BILITOT 0.69 04/27/2015 0820   BILITOT 0.3 09/30/2013 0522     I reviewed the PET CT scan with the patient ASSESSMENT & PLAN:  Tonsillar cancer He tolerated treatment well and part from mild treatment related nausea and vomiting. I recommend we continue the same treatment, 3 weeks and 1 week off for another 3 months and repeat another PET CT scan. His PET scan  from last week showed positive response to treatment.  Pancytopenia due to antineoplastic chemotherapy [facility]) This is likely due to recent treatment. The patient denies recent history of bleeding such as epistaxis, hematuria or hematochezia. He is asymptomatic from the low platelet count. I will observe for now.  he does not require transfusion now. I will continue the chemotherapy at current dose without dosage adjustment.  If the thrombocytopenia gets progressive worse in the future, I might have to delay his treatment or adjust the chemotherapy dose.      Nausea & vomiting I recommend he uses Compazine for 2 days after chemotherapy and if it gets worse, he can add Zofran after 72 hours. I encouraged him to increase oral hydration.    Chronic kidney disease (CKD), stage II (mild) His kidney function is stable. As long as his creatinine clearance remain greater than 30, we will continue chemotherapy.     No orders of the defined types were placed in this encounter.   All questions were answered. The patient knows to call the clinic with any problems, questions or concerns. No barriers to learning was detected. I spent 25 minutes counseling the patient face to face. The total time spent in the appointment was 30 minutes and more than 50% was on counseling and review of test results     [facility], [place], MD 04/27/2015 9:59 AM

## 2015-04-27 NOTE — Telephone Encounter (Signed)
Scheduled patient's appts. Patient not here at the time.       [facility].

## 2015-04-27 NOTE — Assessment & Plan Note (Signed)
I recommend he uses Compazine for 2 days after chemotherapy and if it gets worse, he can add Zofran after 72 hours. I encouraged him to increase oral hydration.

## 2015-04-27 NOTE — Telephone Encounter (Signed)
Per staff message and POF I have scheduled appts. Advised scheduler of appts and to move appts. JMW  

## 2015-04-27 NOTE — Assessment & Plan Note (Signed)
He tolerated treatment well and part from mild treatment related nausea and vomiting. I recommend we continue the same treatment, 3 weeks and 1 week off for another 3 months and repeat another PET CT scan. His PET scan from last week showed positive response to treatment.

## 2015-04-27 NOTE — Assessment & Plan Note (Signed)
His kidney function is stable. As long as his creatinine clearance remain greater than 30, we will continue chemotherapy. 

## 2015-04-28 ENCOUNTER — Other Ambulatory Visit: Payer: Self-pay

## 2015-04-28 ENCOUNTER — Ambulatory Visit: Payer: Self-pay | Admitting: Hematology and Oncology

## 2015-04-28 ENCOUNTER — Ambulatory Visit: Payer: Self-pay

## 2015-05-01 ENCOUNTER — Telehealth: Payer: Self-pay | Admitting: Hematology and Oncology

## 2015-05-01 NOTE — Telephone Encounter (Signed)
Adjusted 1/16 appointment time - patient aware. Other appointments remain the same.

## 2015-05-04 ENCOUNTER — Other Ambulatory Visit: Payer: Self-pay | Admitting: *Deleted

## 2015-05-04 ENCOUNTER — Ambulatory Visit (HOSPITAL_BASED_OUTPATIENT_CLINIC_OR_DEPARTMENT_OTHER): Payer: Medicaid Other

## 2015-05-04 ENCOUNTER — Ambulatory Visit (HOSPITAL_BASED_OUTPATIENT_CLINIC_OR_DEPARTMENT_OTHER): Payer: Medicaid Other | Admitting: *Deleted

## 2015-05-04 ENCOUNTER — Ambulatory Visit: Payer: Medicaid Other

## 2015-05-04 VITALS — BP 148/96 | HR 84 | Temp 98.6°F | Resp 16

## 2015-05-04 DIAGNOSIS — Z5111 Encounter for antineoplastic chemotherapy: Secondary | ICD-10-CM

## 2015-05-04 DIAGNOSIS — C099 Malignant neoplasm of tonsil, unspecified: Secondary | ICD-10-CM | POA: Diagnosis present

## 2015-05-04 LAB — COMPREHENSIVE METABOLIC PANEL
ALT: 16 U/L (ref 0–55)
AST: 24 U/L (ref 5–34)
Albumin: 4 g/dL (ref 3.5–5.0)
Alkaline Phosphatase: 51 U/L (ref 40–150)
Anion Gap: 8 mEq/L (ref 3–11)
BUN: 18.7 mg/dL (ref 7.0–26.0)
CHLORIDE: 104 meq/L (ref 98–109)
CO2: 27 meq/L (ref 22–29)
CREATININE: 1.6 mg/dL — AB (ref 0.7–1.3)
Calcium: 9.4 mg/dL (ref 8.4–10.4)
EGFR: 57 mL/min/{1.73_m2} — ABNORMAL LOW (ref >90)
Glucose: 77 mg/dl (ref 70–140)
Potassium: 4.6 mEq/L (ref 3.5–5.1)
SODIUM: 139 meq/L (ref 136–145)
TOTAL PROTEIN: 7.2 g/dL (ref 6.4–8.3)
Total Bilirubin: 0.58 mg/dL (ref 0.20–1.20)

## 2015-05-04 LAB — CBC WITH DIFFERENTIAL/PLATELET
BASO%: 0.4 % (ref 0.0–2.0)
BASOS ABS: 0 10*3/uL (ref 0.0–0.1)
EOS ABS: 0.1 10*3/uL (ref 0.0–0.5)
EOS%: 2 % (ref 0.0–7.0)
HCT: 32.8 % — ABNORMAL LOW (ref 38.4–49.9)
HGB: 10.9 g/dL — ABNORMAL LOW (ref 13.0–17.1)
LYMPH%: 22.1 % (ref 14.0–49.0)
MCH: 31.1 pg (ref 27.2–33.4)
MCHC: 33.2 g/dL (ref 32.0–36.0)
MCV: 93.4 fL (ref 79.3–98.0)
MONO#: 0.3 10*3/uL (ref 0.1–0.9)
MONO%: 12.6 % (ref 0.0–14.0)
NEUT%: 62.9 % (ref 39.0–75.0)
NEUTROS ABS: 1.6 10*3/uL (ref 1.5–6.5)
NRBC: 0 % (ref 0–0)
PLATELETS: 150 10*3/uL (ref 140–400)
RBC: 3.51 10*6/uL — AB (ref 4.20–5.82)
RDW: 15.2 % — AB (ref 11.0–14.6)
WBC: 2.5 10*3/uL — AB (ref 4.0–10.3)
lymph#: 0.6 10*3/uL — ABNORMAL LOW (ref 0.9–3.3)

## 2015-05-04 MED ORDER — DIPHENHYDRAMINE HCL 50 MG/ML IJ SOLN
INTRAMUSCULAR | Status: AC
  Filled 2015-05-04: qty 1

## 2015-05-04 MED ORDER — SODIUM CHLORIDE 0.9 % IJ SOLN
10.0000 mL | Freq: Once | INTRAMUSCULAR | Status: AC
  Administered 2015-05-04: 10 mL via INTRAVENOUS
  Filled 2015-05-04: qty 10

## 2015-05-04 MED ORDER — SODIUM CHLORIDE 0.9 % IV SOLN
218.0000 mg | Freq: Once | INTRAVENOUS | Status: AC
  Administered 2015-05-04: 220 mg via INTRAVENOUS
  Filled 2015-05-04: qty 22

## 2015-05-04 MED ORDER — PALONOSETRON HCL INJECTION 0.25 MG/5ML
INTRAVENOUS | Status: AC
  Filled 2015-05-04: qty 5

## 2015-05-04 MED ORDER — HEPARIN SOD (PORK) LOCK FLUSH 100 UNIT/ML IV SOLN
500.0000 [IU] | Freq: Once | INTRAVENOUS | Status: AC | PRN
  Administered 2015-05-04: 500 [IU]
  Filled 2015-05-04: qty 5

## 2015-05-04 MED ORDER — PACLITAXEL CHEMO INJECTION 300 MG/50ML
45.0000 mg/m2 | Freq: Once | INTRAVENOUS | Status: AC
  Administered 2015-05-04: 108 mg via INTRAVENOUS
  Filled 2015-05-04: qty 18

## 2015-05-04 MED ORDER — FAMOTIDINE IN NACL 20-0.9 MG/50ML-% IV SOLN
INTRAVENOUS | Status: AC
  Filled 2015-05-04: qty 50

## 2015-05-04 MED ORDER — FAMOTIDINE IN NACL 20-0.9 MG/50ML-% IV SOLN
20.0000 mg | Freq: Once | INTRAVENOUS | Status: AC
  Administered 2015-05-04: 20 mg via INTRAVENOUS

## 2015-05-04 MED ORDER — DEXAMETHASONE SODIUM PHOSPHATE 100 MG/10ML IJ SOLN
10.0000 mg | Freq: Once | INTRAMUSCULAR | Status: AC
  Administered 2015-05-04: 10 mg via INTRAVENOUS
  Filled 2015-05-04: qty 1

## 2015-05-04 MED ORDER — PALONOSETRON HCL INJECTION 0.25 MG/5ML
0.2500 mg | Freq: Once | INTRAVENOUS | Status: AC
  Administered 2015-05-04: 0.25 mg via INTRAVENOUS

## 2015-05-04 MED ORDER — DIPHENHYDRAMINE HCL 50 MG/ML IJ SOLN
50.0000 mg | Freq: Once | INTRAMUSCULAR | Status: AC
  Administered 2015-05-04: 50 mg via INTRAVENOUS

## 2015-05-04 MED ORDER — SODIUM CHLORIDE 0.9 % IV SOLN
Freq: Once | INTRAVENOUS | Status: AC
  Administered 2015-05-04: 13:00:00 via INTRAVENOUS

## 2015-05-04 MED ORDER — OXYCODONE HCL 10 MG PO TABS: 10.0000 mg | ORAL_TABLET | Freq: Four times a day (QID) | ORAL | Status: DC | PRN

## 2015-05-04 MED ORDER — SODIUM CHLORIDE 0.9 % IJ SOLN
10.0000 mL | INTRAMUSCULAR | Status: DC | PRN
  Administered 2015-05-04: 10 mL
  Filled 2015-05-04: qty 10

## 2015-05-04 MED FILL — oxyCODONE HCL 10 MG TABS: 10 | 22 days supply | Qty: 90 | Fill #0

## 2015-05-04 NOTE — Patient Instructions (Signed)

## 2015-05-04 NOTE — Patient Instructions (Signed)
[facility] Discharge Instructions for Patients Receiving Chemotherapy  Today you received the following chemotherapy agents: Taxol and carboplatin.  To help prevent nausea and vomiting after your treatment, we encourage you to take your nausea medication:  Compazine 10 mg every 6 hours as needed; Zofran 8 mg every 8 hours as needed.   If you develop nausea and vomiting that is not controlled by your nausea medication, call the clinic.   BELOW ARE SYMPTOMS THAT SHOULD BE REPORTED IMMEDIATELY:  *FEVER GREATER THAN 100.5 F  *CHILLS WITH OR WITHOUT FEVER  NAUSEA AND VOMITING THAT IS NOT CONTROLLED WITH YOUR NAUSEA MEDICATION  *UNUSUAL SHORTNESS OF BREATH  *UNUSUAL BRUISING OR BLEEDING  TENDERNESS IN MOUTH AND THROAT WITH OR WITHOUT PRESENCE OF ULCERS  *URINARY PROBLEMS  *BOWEL PROBLEMS  UNUSUAL RASH Items with * indicate a potential emergency and should be followed up as soon as possible.  Feel free to call the clinic you have any questions or concerns. The clinic phone number is [phone].  Please show the [place] at check-in to the Emergency Department and triage nurse.

## 2015-05-11 ENCOUNTER — Ambulatory Visit: Payer: Medicaid Other

## 2015-05-11 ENCOUNTER — Other Ambulatory Visit (HOSPITAL_BASED_OUTPATIENT_CLINIC_OR_DEPARTMENT_OTHER): Payer: Medicaid Other

## 2015-05-11 ENCOUNTER — Ambulatory Visit (HOSPITAL_BASED_OUTPATIENT_CLINIC_OR_DEPARTMENT_OTHER): Payer: Medicaid Other

## 2015-05-11 ENCOUNTER — Encounter: Payer: Self-pay | Admitting: Hematology and Oncology

## 2015-05-11 VITALS — BP 133/73 | HR 78 | Temp 98.1°F | Resp 18

## 2015-05-11 DIAGNOSIS — D702 Other drug-induced agranulocytosis: Secondary | ICD-10-CM

## 2015-05-11 DIAGNOSIS — C099 Malignant neoplasm of tonsil, unspecified: Secondary | ICD-10-CM | POA: Diagnosis not present

## 2015-05-11 DIAGNOSIS — Z95828 Presence of other vascular implants and grafts: Secondary | ICD-10-CM

## 2015-05-11 LAB — CBC WITH DIFFERENTIAL/PLATELET
BASO%: 0.6 % (ref 0.0–2.0)
BASOS ABS: 0 10*3/uL (ref 0.0–0.1)
EOS%: 1.1 % (ref 0.0–7.0)
Eosinophils Absolute: 0 10*3/uL (ref 0.0–0.5)
HEMATOCRIT: 32.1 % — AB (ref 38.4–49.9)
HGB: 10.5 g/dL — ABNORMAL LOW (ref 13.0–17.1)
LYMPH#: 0.5 10*3/uL — AB (ref 0.9–3.3)
LYMPH%: 28 % (ref 14.0–49.0)
MCH: 31.1 pg (ref 27.2–33.4)
MCHC: 32.7 g/dL (ref 32.0–36.0)
MCV: 95 fL (ref 79.3–98.0)
MONO#: 0.1 10*3/uL (ref 0.1–0.9)
MONO%: 8 % (ref 0.0–14.0)
NEUT#: 1.1 10*3/uL — ABNORMAL LOW (ref 1.5–6.5)
NEUT%: 62.3 % (ref 39.0–75.0)
PLATELETS: 161 10*3/uL (ref 140–400)
RBC: 3.38 10*6/uL — AB (ref 4.20–5.82)
RDW: 15.3 % — ABNORMAL HIGH (ref 11.0–14.6)
WBC: 1.8 10*3/uL — AB (ref 4.0–10.3)

## 2015-05-11 LAB — COMPREHENSIVE METABOLIC PANEL
ALBUMIN: 3.9 g/dL (ref 3.5–5.0)
ALK PHOS: 54 U/L (ref 40–150)
ALT: 14 U/L (ref 0–55)
AST: 19 U/L (ref 5–34)
Anion Gap: 9 mEq/L (ref 3–11)
BUN: 19.4 mg/dL (ref 7.0–26.0)
CHLORIDE: 107 meq/L (ref 98–109)
CO2: 25 meq/L (ref 22–29)
Calcium: 9.3 mg/dL (ref 8.4–10.4)
Creatinine: 1.6 mg/dL — ABNORMAL HIGH (ref 0.7–1.3)
EGFR: 58 mL/min/{1.73_m2} — AB (ref >90)
GLUCOSE: 89 mg/dL (ref 70–140)
POTASSIUM: 4.4 meq/L (ref 3.5–5.1)
SODIUM: 141 meq/L (ref 136–145)
Total Bilirubin: 0.4 mg/dL (ref 0.20–1.20)
Total Protein: 7 g/dL (ref 6.4–8.3)

## 2015-05-11 MED ORDER — SODIUM CHLORIDE 0.9 % IJ SOLN
10.0000 mL | INTRAMUSCULAR | Status: DC | PRN
  Administered 2015-05-11: 10 mL via INTRAVENOUS
  Filled 2015-05-11: qty 10

## 2015-05-11 MED ORDER — HEPARIN SOD (PORK) LOCK FLUSH 100 UNIT/ML IV SOLN
500.0000 [IU] | Freq: Once | INTRAVENOUS | Status: AC
  Administered 2015-05-11: 500 [IU] via INTRAVENOUS
  Filled 2015-05-11: qty 5

## 2015-05-11 NOTE — Progress Notes (Signed)
Pt needs assistance paying his rent and utility bills.  He has exhausted all funds with the [employer] so I completed an application for [employer] and faxed it today.

## 2015-05-11 NOTE — Progress Notes (Addendum)
Marcia Brash RN called with lab results. ANC 1.1 today. No treatment today per Dr. Alvy Bimler. Patient has no physical complaints and was receptive to Neutropinic precaution education. Patient given a supply of masks and instructed to call clinic with any questions or concerns.

## 2015-05-11 NOTE — Patient Instructions (Signed)
Neutropenia Neutropenia is a condition that occurs when the level of a certain type of white blood cell (neutrophil) in your body becomes lower than normal. Neutrophils are made in the bone marrow and fight infections. These cells protect against bacteria and viruses. The fewer neutrophils you have, and the longer your body remains without them, the greater your risk of getting a severe infection becomes. CAUSES  The cause of neutropenia may be hard to determine. However, it is usually due to 3 main problems:   Decreased production of neutrophils. This may be due to:  Certain medicines such as chemotherapy.  Genetic problems.  Cancer.  Radiation treatments.  Vitamin deficiency.  Some pesticides.  Increased destruction of neutrophils. This may be due to:  Overwhelming infections.  Hemolytic anemia. This is when the body destroys its own blood cells.  Chemotherapy.  Neutrophils moving to areas of the body where they cannot fight infections. This may be due to:  Dialysis procedures.  Conditions where the spleen becomes enlarged. Neutrophils are held in the spleen and are not available to the rest of the body.  Overwhelming infections. The neutrophils are held in the area of the infection and are not available to the rest of the body. SYMPTOMS  There are no specific symptoms of neutropenia. The lack of neutrophils can result in an infection, and an infection can cause various problems. DIAGNOSIS  Diagnosis is made by a blood test. A complete blood count is performed. The normal level of neutrophils in human blood differs with age and race. Infants have lower counts than older children and adults. African Americans have lower counts than Caucasians or Asians. The average adult level is 1500 cells/mm3 of blood. Neutrophil counts are interpreted as follows:  Greater than 1000 cells/mm3 gives normal protection against infection.  500 to 1000 cells/mm3 gives an increased risk for  infection.  200 to 500 cells/mm3 is a greater risk for severe infection.  Lower than 200 cells/mm3 is a marked risk of infection. This may require hospitalization and treatment with antibiotic medicines. TREATMENT  Treatment depends on the underlying cause, severity, and presence of infections or symptoms. It also depends on your health. Your caregiver will discuss the treatment plan with you. Mild cases are often easily treated and have a good outcome. Preventative measures may also be started to limit your risk of infections. Treatment can include:  Taking antibiotics.  Stopping medicines that are known to cause neutropenia.  Correcting nutritional deficiencies by eating green vegetables to supply folic acid and taking vitamin B supplements.  Stopping exposure to pesticides if your neutropenia is related to pesticide exposure.  Taking a blood growth factor called sargramostim, pegfilgrastim, or filgrastim if you are undergoing chemotherapy for cancer. This stimulates white blood cell production.  Removal of the spleen if you have Felty's syndrome and have repeated infections. HOME CARE INSTRUCTIONS   Follow your caregiver's instructions about when you need to have blood work done.  Wash your hands often. Make sure others who come in contact with you also wash their hands.  Wash raw fruits and vegetables before eating them. They can carry bacteria and fungi.  Avoid people with colds or spreadable (contagious) diseases (chickenpox, herpes zoster, influenza).  Avoid large crowds.  Avoid construction areas. The dust can release fungus into the air.  Be cautious around children in daycare or school environments.  Take care of your respiratory system by coughing and deep breathing.  Bathe daily.  Protect your skin from cuts and   burns.  Do not work in the garden or with flowers and plants.  Care for the mouth before and after meals by brushing with a soft toothbrush. If you have  mucositis, do not use mouthwash. Mouthwash contains alcohol and can dry out the mouth even more.  Clean the area between the genitals and the anus (perineal area) after urination and bowel movements. Women need to wipe from front to back.  Use a water soluble lubricant during sexual intercourse and practice good hygiene after. Do not have intercourse if you are severely neutropenic. Check with your caregiver for guidelines.  Exercise daily as tolerated.  Avoid people who were vaccinated with a live vaccine in the past 30 days. You should not receive live vaccines (polio, typhoid).  Do not provide direct care for pets. Avoid animal droppings. Do not clean litter boxes and bird cages.  Do not share food utensils.  Do not use tampons, enemas, or rectal suppositories unless directed by your caregiver.  Use an electric razor to remove hair.  Wash your hands after handling magazines, letters, and newspapers. SEEK IMMEDIATE MEDICAL CARE IF:   You have a fever.  You have chills or start to shake.  You feel nauseous or vomit.  You develop mouth sores.  You develop aches and pains.  You have redness and swelling around open wounds.  Your skin is warm to the touch.  You have pus coming from your wounds.  You develop swollen lymph nodes.  You feel weak or fatigued.  You develop red streaks on the skin. MAKE SURE YOU:  Understand these instructions.  Will watch your condition.  Will get help right away if you are not doing well or get worse.   This information is not intended to replace advice given to you by your health care provider. Make sure you discuss any questions you have with your health care provider.   Document Released: 09/24/2001 Document Revised: 06/27/2011 Document Reviewed: 10/15/2014 Elsevier Interactive Patient Education 2016 [employer]  

## 2015-05-18 ENCOUNTER — Encounter: Payer: Self-pay | Admitting: Hematology and Oncology

## 2015-05-18 ENCOUNTER — Other Ambulatory Visit: Payer: Self-pay | Admitting: *Deleted

## 2015-05-18 ENCOUNTER — Ambulatory Visit (HOSPITAL_BASED_OUTPATIENT_CLINIC_OR_DEPARTMENT_OTHER): Payer: Medicaid Other | Admitting: Hematology and Oncology

## 2015-05-18 VITALS — BP 125/82 | HR 86 | Temp 97.9°F | Resp 18 | Ht 78.0 in | Wt 246.1 lb

## 2015-05-18 DIAGNOSIS — J019 Acute sinusitis, unspecified: Secondary | ICD-10-CM

## 2015-05-18 DIAGNOSIS — T451X5A Adverse effect of antineoplastic and immunosuppressive drugs, initial encounter: Secondary | ICD-10-CM

## 2015-05-18 DIAGNOSIS — C099 Malignant neoplasm of tonsil, unspecified: Secondary | ICD-10-CM

## 2015-05-18 DIAGNOSIS — D61810 Antineoplastic chemotherapy induced pancytopenia: Secondary | ICD-10-CM

## 2015-05-18 HISTORY — DX: Acute sinusitis, unspecified: J01.90

## 2015-05-18 MED ORDER — AMOXICILLIN-POT CLAVULANATE 875-125 MG PO TABS: 1.0000 | ORAL_TABLET | Freq: Two times a day (BID) | ORAL | Status: DC

## 2015-05-18 MED FILL — AMOX-CLAV 875-125 MG TABLET: 875-125 | 7 days supply | Qty: 14 | Fill #0

## 2015-05-18 NOTE — Assessment & Plan Note (Signed)
Clinically, he does appear to have an active infection. Due to recent exposure to chemotherapy and pancytopenia, I recommend a course of oral antibiotic therapy and he agreed to proceed. I will reassess next week.

## 2015-05-18 NOTE — Assessment & Plan Note (Signed)
His treatment was placed on hold due to recent pancytopenia. Continue supportive care. He will get a treatment break and resume next week

## 2015-05-18 NOTE — Progress Notes (Signed)
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 FNA of right level III lymph node was inconclusive for cancer   07/25/2013 Imaging PET scan showed locally advanced disease with abnormal lymphadenopathy in the right axilla   08/06/2013 Procedure CT-guided biopsy of the lymphadenopathy was negative for malignancy   08/15/2013 Surgery Patient has placement of port and feeding tube   08/19/2013 - 09/10/2013 Chemotherapy Patient received chemotherapy with cisplatin. The patient only received 2 doses due to uncontrolled nausea and acute renal failure.   08/19/2013 - 10/15/2013 Radiation Therapy Received Helical IMRT Tomotherapy:  Right Tonstil and bilateral neck / 70 Gy in 35 fractions to gross disease, 63 Gy in 35 fractions to high risk nodal echelons, and 56 Gy in 35 fractions to intermediate risk nodal echelons.   08/27/2013 - 08/30/2013 Hospital Admission The patient was admitted to the hospital for uncontrolled nausea vomiting and dehydration.   02/14/2014 Imaging PET/CT scan showed complete response to treatment   03/19/2014 Surgery He had excisional lymph node biopsy from the right neck. Pathology was benign   05/13/2014 Surgery He had removal of Port-A-Cath.    05/22/2014 Imaging Repeat CT scan of the neck show no evidence of disease recurrence.   12/09/2014 Imaging Ct neck without contrast showed persistent abnormalities on the right side of neck, indeterminate   12/25/2014 Imaging PET CT scan showed disease recurrence.   02/10/2015 Procedure He has placement of port   02/13/2015 -  Chemotherapy He received chemotherapy with carbo/Taxol   04/21/2015 Imaging PET CT scan showed improved disease control    INTERVAL HISTORY: Please see below for problem oriented charting. He is seen urgently because he does not feel well. He has some low-grade fever, sensation of sore throat and nasal drainage. He had recent sick contact. The highest fever at home is 101.2. He denies excessive cough. His chemotherapy was cancelled last week due to pancytopenia. The patient denies any recent signs or symptoms of bleeding such as spontaneous epistaxis, hematuria or hematochezia.  REVIEW OF SYSTEMS:   Eyes: Denies blurriness of vision Respiratory: Denies cough, dyspnea or wheezes Cardiovascular: Denies palpitation, chest discomfort or lower extremity swelling Gastrointestinal:  Denies nausea, heartburn or change in bowel habits Skin: Denies abnormal skin rashes Lymphatics: Denies new lymphadenopathy or easy bruising Neurological:Denies numbness, tingling or new weaknesses Behavioral/Psych: Mood is stable, no new changes  All other systems were reviewed with the patient and are negative.  I have reviewed the past medical history, past surgical history, social history and family history with the patient and they are unchanged from previous note.  ALLERGIES:  is allergic to pollen extract.  MEDICATIONS:  Current Outpatient Prescriptions  Medication Sig Dispense Refill  . acetaminophen (TYLENOL) 325 MG tablet Take 325-650 mg by mouth every  6 (six) hours as needed (pain).    . fluticasone (FLONASE) 50 MCG/ACT nasal spray Place 2 sprays into both nostrils daily. 16 g 2  .  lidocaine-prilocaine (EMLA) cream Apply 1 application topically as needed. 30 g 6  . Multiple Vitamin (MULTIVITAMIN WITH MINERALS) TABS tablet Take 1 tablet by mouth daily.    . ondansetron (ZOFRAN) 8 MG tablet Take 1 tablet (8 mg total) by mouth every 8 (eight) hours as needed for nausea or vomiting. 30 tablet 1  . Oxycodone HCl 10 MG TABS Take 1 tablet (10 mg total) by mouth every 6 (six) hours as needed. 90 tablet 0  . prochlorperazine (COMPAZINE) 10 MG tablet Take 1 tablet (10 mg total) by mouth every 6 (six) hours as needed (Nausea or vomiting). 30 tablet 1  . amoxicillin-clavulanate (AUGMENTIN) 875-125 MG tablet Take 1 tablet by mouth 2 (two) times daily. 14 tablet 0   No current facility-administered medications for this visit.    PHYSICAL EXAMINATION: ECOG PERFORMANCE STATUS: 0 - Asymptomatic  Filed Vitals:   05/18/15 0844  BP: 125/82  Pulse: 86  Temp: 97.9 F (36.6 C)  Resp: 18   Filed Weights   05/18/15 0844  Weight: 246 lb 1.6 oz (111.63 kg)    GENERAL:alert, no distress and comfortable SKIN: skin color, texture, turgor are normal, no rashes or significant lesions EYES: normal, Conjunctiva are pink and non-injected, sclera clear OROPHARYNX:no exudate, no erythema and lips, buccal mucosa, and tongue normal . He has sinus pain over the right maxilla region on deep palpation NECK: supple, thyroid normal size, non-tender, without nodularity LYMPH:  no palpable lymphadenopathy in the cervical, axillary or inguinal LUNGS: clear to auscultation and percussion with normal breathing effort HEART: regular rate & rhythm and no murmurs and no lower extremity edema ABDOMEN:abdomen soft, non-tender and normal bowel sounds Musculoskeletal:no cyanosis of digits and no clubbing  NEURO: alert & oriented x 3 with fluent speech, no focal motor/sensory deficits. He has changes of his voice  LABORATORY DATA:  I have reviewed the data as listed    Component Value Date/Time   NA 141  05/11/2015 0816   NA 140 05/12/2014 1438   K 4.4 05/11/2015 0816   K 4.2 05/12/2014 1438   CL 104 05/12/2014 1438   CO2 25 05/11/2015 0816   CO2 30 05/12/2014 1438   GLUCOSE 89 05/11/2015 0816   GLUCOSE 84 05/12/2014 1438   BUN 19.4 05/11/2015 0816   BUN 8 05/12/2014 1438   CREATININE 1.6* 05/11/2015 0816   CREATININE 1.31 05/12/2014 1438   CALCIUM 9.3 05/11/2015 0816   CALCIUM 9.3 05/12/2014 1438   PROT 7.0 05/11/2015 0816   PROT 7.5 09/30/2013 0522   ALBUMIN 3.9 05/11/2015 0816   ALBUMIN 3.4* 09/30/2013 0522   AST 19 05/11/2015 0816   AST 24 09/30/2013 0522   ALT 14 05/11/2015 0816   ALT 43 09/30/2013 0522   ALKPHOS 54 05/11/2015 0816   ALKPHOS 101 09/30/2013 0522   BILITOT 0.40 05/11/2015 0816   BILITOT 0.3 09/30/2013 0522   GFRNONAA 64* 05/12/2014 1438   GFRAA 74* 05/12/2014 1438    No results found for: SPEP, UPEP  Lab Results  Component Value Date   WBC 1.8* 05/11/2015   NEUTROABS 1.1* 05/11/2015   HGB 10.5* 05/11/2015   HCT 32.1* 05/11/2015   MCV 95.0 05/11/2015   PLT 161 05/11/2015      Chemistry      Component Value Date/Time   NA 141 05/11/2015  0816   NA 140 05/12/2014 1438   K 4.4 05/11/2015 0816   K 4.2 05/12/2014 1438   CL 104 05/12/2014 1438   CO2 25 05/11/2015 0816   CO2 30 05/12/2014 1438   BUN 19.4 05/11/2015 0816   BUN 8 05/12/2014 1438   CREATININE 1.6* 05/11/2015 0816   CREATININE 1.31 05/12/2014 1438      Component Value Date/Time   CALCIUM 9.3 05/11/2015 0816   CALCIUM 9.3 05/12/2014 1438   ALKPHOS 54 05/11/2015 0816   ALKPHOS 101 09/30/2013 0522   AST 19 05/11/2015 0816   AST 24 09/30/2013 0522   ALT 14 05/11/2015 0816   ALT 43 09/30/2013 0522   BILITOT 0.40 05/11/2015 0816   BILITOT 0.3 09/30/2013 0522     ASSESSMENT & PLAN:  Tonsillar cancer His treatment was placed on hold due to recent pancytopenia. Continue supportive care. He will get a treatment break and resume next week  Acute sinusitis,  unspecified Clinically, he does appear to have an active infection. Due to recent exposure to chemotherapy and pancytopenia, I recommend a course of oral antibiotic therapy and he agreed to proceed. I will reassess next week.  Pancytopenia due to antineoplastic chemotherapy [facility]) He is not symptomatic apart from recent infection. He does not require transfusion for anemia and thrombocytopenia   No orders of the defined types were placed in this encounter.   All questions were answered. The patient knows to call the clinic with any problems, questions or concerns. No barriers to learning was detected. I spent 15 minutes counseling the patient face to face. The total time spent in the appointment was 20 minutes and more than 50% was on counseling and review of test results     [facility], [place], MD 05/18/2015 12:44 PM

## 2015-05-18 NOTE — Assessment & Plan Note (Signed)
He is not symptomatic apart from recent infection. He does not require transfusion for anemia and thrombocytopenia

## 2015-05-25 ENCOUNTER — Encounter: Payer: Self-pay | Admitting: Hematology and Oncology

## 2015-05-25 ENCOUNTER — Ambulatory Visit: Payer: Medicaid Other

## 2015-05-25 ENCOUNTER — Ambulatory Visit: Payer: Medicaid Other | Admitting: Nutrition

## 2015-05-25 ENCOUNTER — Telehealth: Payer: Self-pay | Admitting: Hematology and Oncology

## 2015-05-25 ENCOUNTER — Telehealth: Payer: Self-pay | Admitting: *Deleted

## 2015-05-25 ENCOUNTER — Ambulatory Visit (HOSPITAL_BASED_OUTPATIENT_CLINIC_OR_DEPARTMENT_OTHER): Payer: Medicaid Other

## 2015-05-25 ENCOUNTER — Ambulatory Visit (HOSPITAL_BASED_OUTPATIENT_CLINIC_OR_DEPARTMENT_OTHER): Payer: Medicaid Other | Admitting: Hematology and Oncology

## 2015-05-25 ENCOUNTER — Other Ambulatory Visit (HOSPITAL_BASED_OUTPATIENT_CLINIC_OR_DEPARTMENT_OTHER): Payer: Medicaid Other

## 2015-05-25 VITALS — BP 138/75 | HR 72 | Temp 98.2°F | Resp 18

## 2015-05-25 DIAGNOSIS — G893 Neoplasm related pain (acute) (chronic): Secondary | ICD-10-CM | POA: Diagnosis not present

## 2015-05-25 DIAGNOSIS — M109 Gout, unspecified: Secondary | ICD-10-CM | POA: Insufficient documentation

## 2015-05-25 DIAGNOSIS — M10071 Idiopathic gout, right ankle and foot: Secondary | ICD-10-CM | POA: Diagnosis not present

## 2015-05-25 DIAGNOSIS — C099 Malignant neoplasm of tonsil, unspecified: Secondary | ICD-10-CM

## 2015-05-25 DIAGNOSIS — T451X5A Adverse effect of antineoplastic and immunosuppressive drugs, initial encounter: Secondary | ICD-10-CM

## 2015-05-25 DIAGNOSIS — Z5111 Encounter for antineoplastic chemotherapy: Secondary | ICD-10-CM | POA: Diagnosis not present

## 2015-05-25 DIAGNOSIS — G620 Drug-induced polyneuropathy: Secondary | ICD-10-CM

## 2015-05-25 DIAGNOSIS — N182 Chronic kidney disease, stage 2 (mild): Secondary | ICD-10-CM

## 2015-05-25 DIAGNOSIS — D630 Anemia in neoplastic disease: Secondary | ICD-10-CM

## 2015-05-25 LAB — COMPREHENSIVE METABOLIC PANEL
ALT: 18 U/L (ref 0–55)
ANION GAP: 8 meq/L (ref 3–11)
AST: 22 U/L (ref 5–34)
Albumin: 3.8 g/dL (ref 3.5–5.0)
Alkaline Phosphatase: 65 U/L (ref 40–150)
BUN: 17 mg/dL (ref 7.0–26.0)
CALCIUM: 9.2 mg/dL (ref 8.4–10.4)
CHLORIDE: 107 meq/L (ref 98–109)
CO2: 26 meq/L (ref 22–29)
CREATININE: 1.5 mg/dL — AB (ref 0.7–1.3)
EGFR: 65 mL/min/{1.73_m2} — ABNORMAL LOW (ref >90)
Glucose: 94 mg/dl (ref 70–140)
Potassium: 4.2 mEq/L (ref 3.5–5.1)
Sodium: 141 mEq/L (ref 136–145)
TOTAL PROTEIN: 6.9 g/dL (ref 6.4–8.3)
Total Bilirubin: <0.3 mg/dL (ref 0.20–1.20)

## 2015-05-25 LAB — CBC WITH DIFFERENTIAL/PLATELET
BASO%: 0 % (ref 0.0–2.0)
Basophils Absolute: 0 10*3/uL (ref 0.0–0.1)
EOS%: 0.7 % (ref 0.0–7.0)
Eosinophils Absolute: 0 10*3/uL (ref 0.0–0.5)
HEMATOCRIT: 33.2 % — AB (ref 38.4–49.9)
HGB: 10.8 g/dL — ABNORMAL LOW (ref 13.0–17.1)
LYMPH#: 0.7 10*3/uL — AB (ref 0.9–3.3)
LYMPH%: 17.3 % (ref 14.0–49.0)
MCH: 31.1 pg (ref 27.2–33.4)
MCHC: 32.5 g/dL (ref 32.0–36.0)
MCV: 95.7 fL (ref 79.3–98.0)
MONO#: 0.4 10*3/uL (ref 0.1–0.9)
MONO%: 8.7 % (ref 0.0–14.0)
NEUT%: 73.3 % (ref 39.0–75.0)
NEUTROS ABS: 3.1 10*3/uL (ref 1.5–6.5)
PLATELETS: 224 10*3/uL (ref 140–400)
RBC: 3.47 10*6/uL — ABNORMAL LOW (ref 4.20–5.82)
RDW: 16 % — AB (ref 11.0–14.6)
WBC: 4.2 10*3/uL (ref 4.0–10.3)

## 2015-05-25 MED ORDER — OXYCODONE HCL 10 MG PO TABS
10.0000 mg | ORAL_TABLET | Freq: Four times a day (QID) | ORAL | Status: DC | PRN
Start: 2015-05-25 — End: 2015-06-15

## 2015-05-25 MED ORDER — FAMOTIDINE IN NACL 20-0.9 MG/50ML-% IV SOLN
INTRAVENOUS | Status: AC
  Filled 2015-05-25: qty 50

## 2015-05-25 MED ORDER — SODIUM CHLORIDE 0.9 % IJ SOLN
10.0000 mL | Freq: Once | INTRAMUSCULAR | Status: AC
  Administered 2015-05-25: 10 mL
  Filled 2015-05-25: qty 10

## 2015-05-25 MED ORDER — SODIUM CHLORIDE 0.9 % IJ SOLN
10.0000 mL | INTRAMUSCULAR | Status: DC | PRN
  Administered 2015-05-25: 10 mL
  Filled 2015-05-25: qty 10

## 2015-05-25 MED ORDER — DIPHENHYDRAMINE HCL 50 MG/ML IJ SOLN
50.0000 mg | Freq: Once | INTRAMUSCULAR | Status: AC
  Administered 2015-05-25: 50 mg via INTRAVENOUS

## 2015-05-25 MED ORDER — HEPARIN SOD (PORK) LOCK FLUSH 100 UNIT/ML IV SOLN
500.0000 [IU] | Freq: Once | INTRAVENOUS | Status: AC | PRN
  Administered 2015-05-25: 500 [IU]
  Filled 2015-05-25: qty 5

## 2015-05-25 MED ORDER — PACLITAXEL CHEMO INJECTION 300 MG/50ML
45.0000 mg/m2 | Freq: Once | INTRAVENOUS | Status: AC
  Administered 2015-05-25: 108 mg via INTRAVENOUS
  Filled 2015-05-25: qty 18

## 2015-05-25 MED ORDER — FAMOTIDINE IN NACL 20-0.9 MG/50ML-% IV SOLN
20.0000 mg | Freq: Once | INTRAVENOUS | Status: AC
  Administered 2015-05-25: 20 mg via INTRAVENOUS

## 2015-05-25 MED ORDER — DIPHENHYDRAMINE HCL 50 MG/ML IJ SOLN
INTRAMUSCULAR | Status: AC
  Filled 2015-05-25: qty 1

## 2015-05-25 MED ORDER — SODIUM CHLORIDE 0.9 % IV SOLN
Freq: Once | INTRAVENOUS | Status: AC
Start: 2015-05-25 — End: 2015-05-25
  Administered 2015-05-25: 10:00:00 via INTRAVENOUS

## 2015-05-25 MED ORDER — PALONOSETRON HCL INJECTION 0.25 MG/5ML
INTRAVENOUS | Status: AC
  Filled 2015-05-25: qty 5

## 2015-05-25 MED ORDER — SODIUM CHLORIDE 0.9 % IV SOLN
10.0000 mg | Freq: Once | INTRAVENOUS | Status: AC
  Administered 2015-05-25: 10 mg via INTRAVENOUS
  Filled 2015-05-25: qty 1

## 2015-05-25 MED ORDER — SODIUM CHLORIDE 0.9 % IV SOLN
229.2000 mg | Freq: Once | INTRAVENOUS | Status: AC
  Administered 2015-05-25: 230 mg via INTRAVENOUS
  Filled 2015-05-25: qty 23

## 2015-05-25 MED ORDER — PALONOSETRON HCL INJECTION 0.25 MG/5ML
0.2500 mg | Freq: Once | INTRAVENOUS | Status: AC
  Administered 2015-05-25: 0.25 mg via INTRAVENOUS

## 2015-05-25 MED FILL — oxyCODONE HCL 10 MG TABS: 10 | 22 days supply | Qty: 90 | Fill #0

## 2015-05-25 NOTE — Assessment & Plan Note (Signed)
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 After extensive discussion, he was willing to start gabapentin 300 mg. Since I saw him last month, he finds that very helpful  I will continue same dose treatment without further dose adjustment

## 2015-05-25 NOTE — Assessment & Plan Note (Signed)
He had acute gout attack. I recommend 5 day course of steroid therapy along with his pain medicine. I reassured him.

## 2015-05-25 NOTE — Progress Notes (Signed)
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 FNA of right level III lymph node was inconclusive for cancer   07/25/2013 Imaging PET scan showed locally advanced disease with abnormal lymphadenopathy in the right axilla   08/06/2013 Procedure CT-guided biopsy of the lymphadenopathy was negative for malignancy   08/15/2013 Surgery Patient has placement of port and feeding tube   08/19/2013 - 09/10/2013 Chemotherapy Patient received chemotherapy with cisplatin. The patient only received 2 doses due to uncontrolled nausea and acute renal failure.   08/19/2013 - 10/15/2013 Radiation Therapy Received Helical IMRT Tomotherapy:  Right Tonstil and bilateral neck / 70 Gy in 35 fractions to gross disease, 63 Gy in 35 fractions to high risk nodal echelons, and 56 Gy in 35 fractions to intermediate risk nodal echelons.   08/27/2013 - 08/30/2013 Hospital Admission The patient was admitted to the hospital for uncontrolled nausea vomiting and dehydration.   02/14/2014 Imaging PET/CT scan showed complete response to treatment   03/19/2014 Surgery He had excisional lymph node biopsy from the right neck. Pathology was benign   05/13/2014 Surgery He had removal of Port-A-Cath.   05/22/2014 Imaging Repeat CT scan of the neck show no evidence of disease recurrence.   12/09/2014 Imaging Ct neck without contrast showed persistent abnormalities on the right side of neck, indeterminate   12/25/2014 Imaging PET CT scan showed disease recurrence.   02/10/2015 Procedure He has placement of port   02/13/2015 -  Chemotherapy He received chemotherapy with carbo/Taxol   04/21/2015 Imaging PET CT scan showed improved disease control    INTERVAL HISTORY: Please see below for problem oriented charting..  he returns for further follow-up, prior to chemo He has acute gout attack on the right foot since last week. This getting better. His peripheral neuropathy is not worse. He denies recent infection. His prior upper respiratory tract infection has resolved with a course of antibiotic therapy.  denies new lymphadenopathy. The patient denies any recent signs or symptoms of bleeding such as spontaneous epistaxis, hematuria or hematochezia.   REVIEW OF SYSTEMS:   Constitutional: Denies fevers, chills or abnormal weight loss Eyes: Denies blurriness of vision Ears, nose, mouth, throat, and face: Denies mucositis or sore throat Respiratory: Denies cough, dyspnea or wheezes Cardiovascular: Denies palpitation, chest discomfort or lower extremity swelling Gastrointestinal:  Denies nausea, heartburn or change in bowel habits Skin: Denies abnormal skin rashes Lymphatics: Denies new lymphadenopathy or easy bruising Neurological:Denies numbness, tingling or new weaknesses Behavioral/Psych: Mood is stable, no new changes  All other systems were reviewed with the patient and are negative.  I have reviewed the past medical history, past surgical history, social history and family history with the patient and they are unchanged from previous note.  ALLERGIES:  is allergic to pollen extract.  MEDICATIONS:  Current Outpatient Prescriptions  Medication Sig Dispense Refill  . acetaminophen  (TYLENOL) 325 MG tablet Take 325-650 mg by mouth every 6 (six) hours as needed (pain).    Marland Kitchen amoxicillin-clavulanate (AUGMENTIN) 875-125 MG tablet Take 1 tablet by mouth 2 (two) times daily. 14 tablet 0  . fluticasone (FLONASE) 50 MCG/ACT nasal spray Place 2 sprays into both nostrils daily. 16 g 2  . lidocaine-prilocaine (EMLA) cream Apply 1 application topically as needed. 30 g 6  . Multiple Vitamin (MULTIVITAMIN WITH MINERALS) TABS tablet Take 1 tablet by mouth daily.    . ondansetron (ZOFRAN) 8 MG tablet Take 1 tablet (8 mg total) by mouth every 8 (eight) hours as needed for nausea or vomiting. 30 tablet 1  . Oxycodone HCl 10 MG TABS Take 1 tablet (10 mg total) by mouth every 6 (six) hours as needed. 90 tablet 0  . prochlorperazine (COMPAZINE) 10 MG tablet Take 1 tablet (10 mg total) by mouth every 6 (six) hours as needed (Nausea or vomiting). 30 tablet 1   No current facility-administered medications for this visit.   Facility-Administered Medications Ordered in Other Visits  Medication Dose Route Frequency Provider Last Rate Last Dose  . CARBOplatin (PARAPLATIN) 230 mg in sodium chloride 0.9 % 100 mL chemo infusion  230 mg Intravenous Once [name], MD      . dexamethasone (DECADRON) 10 mg in sodium chloride 0.9 % 50 mL IVPB  10 mg Intravenous Once [name], MD      . famotidine (PEPCID) IVPB 20 mg premix  20 mg Intravenous Once [name], MD      . heparin lock flush 100 unit/mL  500 Units Intracatheter Once PRN [name], MD      . PACLitaxel (TAXOL) 108 mg in dextrose 5 % 250 mL chemo infusion (</= 80mg /m2)  45 mg/m2 (Treatment Plan Actual) Intravenous Once [name], MD      . sodium chloride 0.9 % injection 10 mL  10 mL Intracatheter PRN [name], MD        PHYSICAL EXAMINATION: ECOG PERFORMANCE STATUS: 1 - Symptomatic but completely ambulatory GENERAL:alert, no distress and comfortable SKIN: skin color, texture, turgor are normal, no rashes or significant lesions EYES:  normal, Conjunctiva are pink and non-injected, sclera clear OROPHARYNX:no exudate, no erythema and lips, buccal mucosa, and tongue normal  NECK:  Well-healed surgical scar. No other abnormalities LYMPH:  no palpable lymphadenopathy in the cervical, axillary or inguinal LUNGS: clear to auscultation and percussion with normal breathing effort HEART: regular rate & rhythm and no murmurs and no lower extremity edema ABDOMEN:abdomen soft, non-tender and normal bowel sounds Musculoskeletal:no cyanosis of digits and no clubbing . Noted acute joint swelling on the right big toe NEURO: alert & oriented x 3 with fluent speech, no focal motor/sensory deficits  LABORATORY DATA:  I have reviewed the data as listed    Component Value Date/Time   NA 141 05/25/2015 0834   NA 140 05/12/2014 1438   K 4.2 05/25/2015 0834   K 4.2 05/12/2014 1438   CL 104 05/12/2014 1438   CO2 26 05/25/2015 0834   CO2 30 05/12/2014 1438   GLUCOSE 94 05/25/2015 0834   GLUCOSE 84 05/12/2014 1438   BUN 17.0 05/25/2015 0834   BUN 8 05/12/2014 1438   CREATININE 1.5* 05/25/2015 0834   CREATININE 1.31 05/12/2014 1438   CALCIUM 9.2 05/25/2015 0834   CALCIUM 9.3 05/12/2014 1438   PROT 6.9 05/25/2015 0834   PROT 7.5  09/30/2013 0522   ALBUMIN 3.8 05/25/2015 0834   ALBUMIN 3.4* 09/30/2013 0522   AST 22 05/25/2015 0834   AST 24 09/30/2013 0522   ALT 18 05/25/2015 0834   ALT 43 09/30/2013 0522   ALKPHOS 65 05/25/2015 0834   ALKPHOS 101 09/30/2013 0522   BILITOT <0.30 05/25/2015 0834   BILITOT 0.3 09/30/2013 0522   GFRNONAA 64* 05/12/2014 1438   GFRAA 74* 05/12/2014 1438    No results found for: SPEP, UPEP  Lab Results  Component Value Date   WBC 4.2 05/25/2015   NEUTROABS 3.1 05/25/2015   HGB 10.8* 05/25/2015   HCT 33.2* 05/25/2015   MCV 95.7 05/25/2015   PLT 224 05/25/2015      Chemistry      Component Value Date/Time   NA 141 05/25/2015 0834   NA 140 05/12/2014 1438   K 4.2 05/25/2015 0834   K 4.2  05/12/2014 1438   CL 104 05/12/2014 1438   CO2 26 05/25/2015 0834   CO2 30 05/12/2014 1438   BUN 17.0 05/25/2015 0834   BUN 8 05/12/2014 1438   CREATININE 1.5* 05/25/2015 0834   CREATININE 1.31 05/12/2014 1438      Component Value Date/Time   CALCIUM 9.2 05/25/2015 0834   CALCIUM 9.3 05/12/2014 1438   ALKPHOS 65 05/25/2015 0834   ALKPHOS 101 09/30/2013 0522   AST 22 05/25/2015 0834   AST 24 09/30/2013 0522   ALT 18 05/25/2015 0834   ALT 43 09/30/2013 0522   BILITOT <0.30 05/25/2015 0834   BILITOT 0.3 09/30/2013 0522     ASSESSMENT & PLAN:  Tonsillar cancer He tolerated treatment well and part from mild treatment related nausea and vomiting and pancytopenia. I recommend we continue the same treatment but modify to 2 weeks and 1 week off for another 3 months and repeat another PET CT scan. Last PET scan from January 2017 showed positive response to treatment.  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 The patient denies recent history of bleeding such as epistaxis, hematuria or hematochezia. He is asymptomatic from the anemia. We will observe for now.    Chronic kidney disease (CKD), stage II (mild) His kidney function is stable. As long as his creatinine clearance remain greater than 30, we will continue chemotherapy.    Cancer associated pain He has poorly controlled pain with Percocet. I recommend increasing his pain medication to IR morphine to be taking along with Tylenol for pain control.  he felt that IR morphine caused too much nausea and I switched him to oxycodone with good success. I refilled his pain medications  Acute gout involving toe of right foot  He had acute gout attack. I recommend 5 day course of steroid therapy along with his pain medicine. I reassured him.  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 After extensive  discussion, he was willing to start gabapentin 300 mg. Since I saw him last month, he finds that very helpful  I will continue same dose treatment without further dose adjustment   No orders of the defined types were placed in this encounter.   All questions were answered. The patient knows to call the clinic with any problems, questions or concerns. No barriers to learning was detected. I spent 25 minutes counseling the patient face to face. The total time spent in the appointment was 30 minutes and more than 50% was on counseling  and review of test results     [facility], Jemari Hallum, MD 05/25/2015 10:12 AM

## 2015-05-25 NOTE — Assessment & Plan Note (Signed)
His kidney function is stable. As long as his creatinine clearance remain greater than 30, we will continue chemotherapy. 

## 2015-05-25 NOTE — Progress Notes (Signed)
Addendum err

## 2015-05-25 NOTE — Assessment & Plan Note (Signed)
This is likely anemia of chronic disease. The patient denies recent history of bleeding such as epistaxis, hematuria or hematochezia. He is asymptomatic from the anemia. We will observe for now.  

## 2015-05-25 NOTE — Progress Notes (Signed)
Nutrition follow-up completed with patient during infusion. Weight is stable and documented as 246 pounds. Patient reports he drinks one Ensure Plus and 1 protein drink (Naked) from the grocery store first thing in the morning. This allows him to eat throughout the day. Patient requesting additional samples of Ensure Plus.  Nutrition diagnosis: Inadequate oral intake improved.  Intervention: Educated patient to continue high-calorie, high-protein, nutrient dense foods throughout the day. Provided second complimentary case of Ensure Plus. Questions were answered.  Teach back method used.  Monitoring, evaluation, goals: Patient will work to increase calories and protein to promote weight maintenance.  Next visit: Monday, February 27, during infusion.  **Disclaimer: This note was dictated with voice recognition software. Similar sounding words can inadvertently be transcribed and this note may contain transcription errors which may not have been corrected upon publication of note.**

## 2015-05-25 NOTE — Assessment & Plan Note (Signed)
He has poorly controlled pain with Percocet. I recommend increasing his pain medication to IR morphine to be taking along with Tylenol for pain control.  he felt that IR morphine caused too much nausea and I switched him to oxycodone with good success. I refilled his pain medications

## 2015-05-25 NOTE — Telephone Encounter (Signed)
Pt confirmed labs/ov per 02/06 POF, gave pt AVS and Calendar... KJ, sent msg to add chemo °

## 2015-05-25 NOTE — Assessment & Plan Note (Signed)
He tolerated treatment well and part from mild treatment related nausea and vomiting and pancytopenia. I recommend we continue the same treatment but modify to 2 weeks and 1 week off for another 3 months and repeat another PET CT scan. Last PET scan from January 2017 showed positive response to treatment.

## 2015-05-25 NOTE — Patient Instructions (Signed)
[facility] Discharge Instructions for Patients Receiving Chemotherapy  Today you received the following chemotherapy agents Taxol/Carboplatin.  To help prevent nausea and vomiting after your treatment, we encourage you to take your nausea medication as directed.   If you develop nausea and vomiting that is not controlled by your nausea medication, call the clinic.   BELOW ARE SYMPTOMS THAT SHOULD BE REPORTED IMMEDIATELY:  *FEVER GREATER THAN 100.5 F  *CHILLS WITH OR WITHOUT FEVER  NAUSEA AND VOMITING THAT IS NOT CONTROLLED WITH YOUR NAUSEA MEDICATION  *UNUSUAL SHORTNESS OF BREATH  *UNUSUAL BRUISING OR BLEEDING  TENDERNESS IN MOUTH AND THROAT WITH OR WITHOUT PRESENCE OF ULCERS  *URINARY PROBLEMS  *BOWEL PROBLEMS  UNUSUAL RASH Items with * indicate a potential emergency and should be followed up as soon as possible.  Feel free to call the clinic you have any questions or concerns. The clinic phone number is [phone].  Please show the CHEMO ALERT CARD at check-in to the Emergency Department and triage nurse.    

## 2015-05-25 NOTE — Telephone Encounter (Signed)
Per staff message and POF I have scheduled appts. Advised scheduler of appts. JMW  

## 2015-05-28 ENCOUNTER — Encounter: Payer: Self-pay | Admitting: *Deleted

## 2015-06-01 ENCOUNTER — Ambulatory Visit (HOSPITAL_BASED_OUTPATIENT_CLINIC_OR_DEPARTMENT_OTHER): Payer: Medicaid Other

## 2015-06-01 ENCOUNTER — Encounter: Payer: Self-pay | Admitting: *Deleted

## 2015-06-01 ENCOUNTER — Other Ambulatory Visit (HOSPITAL_BASED_OUTPATIENT_CLINIC_OR_DEPARTMENT_OTHER): Payer: Medicaid Other

## 2015-06-01 ENCOUNTER — Ambulatory Visit: Payer: Medicaid Other

## 2015-06-01 DIAGNOSIS — C099 Malignant neoplasm of tonsil, unspecified: Secondary | ICD-10-CM | POA: Diagnosis not present

## 2015-06-01 DIAGNOSIS — Z5111 Encounter for antineoplastic chemotherapy: Secondary | ICD-10-CM | POA: Diagnosis not present

## 2015-06-01 DIAGNOSIS — Z95828 Presence of other vascular implants and grafts: Secondary | ICD-10-CM

## 2015-06-01 LAB — COMPREHENSIVE METABOLIC PANEL
ALBUMIN: 4.3 g/dL (ref 3.5–5.0)
ALT: 22 U/L (ref 0–55)
AST: 32 U/L (ref 5–34)
Alkaline Phosphatase: 68 U/L (ref 40–150)
Anion Gap: 10 mEq/L (ref 3–11)
BUN: 18.4 mg/dL (ref 7.0–26.0)
CHLORIDE: 106 meq/L (ref 98–109)
CO2: 25 meq/L (ref 22–29)
Calcium: 9.6 mg/dL (ref 8.4–10.4)
Creatinine: 1.6 mg/dL — ABNORMAL HIGH (ref 0.7–1.3)
EGFR: 61 mL/min/{1.73_m2} — AB (ref >90)
GLUCOSE: 102 mg/dL (ref 70–140)
POTASSIUM: 4.6 meq/L (ref 3.5–5.1)
SODIUM: 141 meq/L (ref 136–145)
Total Bilirubin: 0.48 mg/dL (ref 0.20–1.20)
Total Protein: 7.9 g/dL (ref 6.4–8.3)

## 2015-06-01 LAB — CBC WITH DIFFERENTIAL/PLATELET
BASO%: 0.2 % (ref 0.0–2.0)
BASOS ABS: 0 10*3/uL (ref 0.0–0.1)
EOS ABS: 0.1 10*3/uL (ref 0.0–0.5)
EOS%: 1.3 % (ref 0.0–7.0)
HCT: 34.9 % — ABNORMAL LOW (ref 38.4–49.9)
HEMOGLOBIN: 11.7 g/dL — AB (ref 13.0–17.1)
LYMPH%: 12.7 % — AB (ref 14.0–49.0)
MCH: 31.9 pg (ref 27.2–33.4)
MCHC: 33.5 g/dL (ref 32.0–36.0)
MCV: 95.1 fL (ref 79.3–98.0)
MONO#: 0.5 10*3/uL (ref 0.1–0.9)
MONO%: 10.4 % (ref 0.0–14.0)
NEUT#: 3.9 10*3/uL (ref 1.5–6.5)
NEUT%: 75.4 % — AB (ref 39.0–75.0)
Platelets: 270 10*3/uL (ref 140–400)
RBC: 3.67 10*6/uL — AB (ref 4.20–5.82)
RDW: 16 % — AB (ref 11.0–14.6)
WBC: 5.2 10*3/uL (ref 4.0–10.3)
lymph#: 0.7 10*3/uL — ABNORMAL LOW (ref 0.9–3.3)

## 2015-06-01 MED ORDER — HEPARIN SOD (PORK) LOCK FLUSH 100 UNIT/ML IV SOLN
500.0000 [IU] | Freq: Once | INTRAVENOUS | Status: AC | PRN
Start: 2015-06-01 — End: 2015-06-01
  Administered 2015-06-01: 500 [IU]
  Filled 2015-06-01: qty 5

## 2015-06-01 MED ORDER — SODIUM CHLORIDE 0.9 % IV SOLN
10.0000 mg | Freq: Once | INTRAVENOUS | Status: AC
  Administered 2015-06-01: 10 mg via INTRAVENOUS
  Filled 2015-06-01: qty 1

## 2015-06-01 MED ORDER — SODIUM CHLORIDE 0.9 % IJ SOLN
10.0000 mL | INTRAMUSCULAR | Status: DC | PRN
  Administered 2015-06-01: 10 mL
  Filled 2015-06-01: qty 10

## 2015-06-01 MED ORDER — PALONOSETRON HCL INJECTION 0.25 MG/5ML
0.2500 mg | Freq: Once | INTRAVENOUS | Status: AC
  Administered 2015-06-01: 0.25 mg via INTRAVENOUS

## 2015-06-01 MED ORDER — SODIUM CHLORIDE 0.9 % IV SOLN
220.0000 mg | Freq: Once | INTRAVENOUS | Status: AC
  Administered 2015-06-01: 220 mg via INTRAVENOUS
  Filled 2015-06-01: qty 22

## 2015-06-01 MED ORDER — DIPHENHYDRAMINE HCL 50 MG/ML IJ SOLN
50.0000 mg | Freq: Once | INTRAMUSCULAR | Status: AC
  Administered 2015-06-01: 50 mg via INTRAVENOUS

## 2015-06-01 MED ORDER — SODIUM CHLORIDE 0.9 % IV SOLN
Freq: Once | INTRAVENOUS | Status: AC
  Administered 2015-06-01: 11:00:00 via INTRAVENOUS

## 2015-06-01 MED ORDER — FAMOTIDINE IN NACL 20-0.9 MG/50ML-% IV SOLN
20.0000 mg | Freq: Once | INTRAVENOUS | Status: AC
  Administered 2015-06-01: 20 mg via INTRAVENOUS

## 2015-06-01 MED ORDER — SODIUM CHLORIDE 0.9% FLUSH
10.0000 mL | INTRAVENOUS | Status: DC | PRN
  Administered 2015-06-01: 10 mL via INTRAVENOUS
  Filled 2015-06-01: qty 10

## 2015-06-01 MED ORDER — DEXTROSE 5 % IV SOLN
45.0000 mg/m2 | Freq: Once | INTRAVENOUS | Status: AC
  Administered 2015-06-01: 108 mg via INTRAVENOUS
  Filled 2015-06-01: qty 18

## 2015-06-01 NOTE — Progress Notes (Signed)
  Oncology Nurse Navigator Documentation  Navigator Location: [facility]-Med Onc (06/01/15 1140) Navigator Encounter Type: Clinic/MDC (06/01/15 1140)           Patient Visit Type: MedOnc (06/01/15 1140)   Barriers/Navigation Needs: Financial (requested gas card) (06/01/15 1140)     Visited Mason [name] in Infusion to offer support and encouragement during his tmt, check on his well-being. He reported he was doing well with tmt, maintaining a positive attitude about prognosis. He asked me to check with Lauren, LCSW, re the availability of another gas card.  I indicated I would make her aware.  He denied any other needs at this time, understands he can contact me.  Gayleen Orem, RN, BSN, [facility] [phone]                        Time Spent with Patient: 15 (06/01/15 1140)

## 2015-06-01 NOTE — Patient Instructions (Signed)
[facility] Discharge Instructions for Patients Receiving Chemotherapy  Today you received the following chemotherapy agents Taxol/Carboplatin.  To help prevent nausea and vomiting after your treatment, we encourage you to take your nausea medication as directed.   If you develop nausea and vomiting that is not controlled by your nausea medication, call the clinic.   BELOW ARE SYMPTOMS THAT SHOULD BE REPORTED IMMEDIATELY:  *FEVER GREATER THAN 100.5 F  *CHILLS WITH OR WITHOUT FEVER  NAUSEA AND VOMITING THAT IS NOT CONTROLLED WITH YOUR NAUSEA MEDICATION  *UNUSUAL SHORTNESS OF BREATH  *UNUSUAL BRUISING OR BLEEDING  TENDERNESS IN MOUTH AND THROAT WITH OR WITHOUT PRESENCE OF ULCERS  *URINARY PROBLEMS  *BOWEL PROBLEMS  UNUSUAL RASH Items with * indicate a potential emergency and should be followed up as soon as possible.  Feel free to call the clinic you have any questions or concerns. The clinic phone number is [phone].  Please show the CHEMO ALERT CARD at check-in to the Emergency Department and triage nurse.    

## 2015-06-04 ENCOUNTER — Other Ambulatory Visit: Payer: Self-pay

## 2015-06-04 ENCOUNTER — Ambulatory Visit: Payer: Self-pay

## 2015-06-13 ENCOUNTER — Encounter (HOSPITAL_COMMUNITY): Payer: Self-pay | Admitting: Emergency Medicine

## 2015-06-13 ENCOUNTER — Emergency Department (HOSPITAL_COMMUNITY)
Admission: EM | Admit: 2015-06-13 | Discharge: 2015-06-14 | Disposition: A | Discharge status: Home / Self Care | Payer: Medicaid Other | Attending: Emergency Medicine | Admitting: Emergency Medicine

## 2015-06-13 DIAGNOSIS — I10 Essential (primary) hypertension: Secondary | ICD-10-CM | POA: Diagnosis not present

## 2015-06-13 DIAGNOSIS — Z8659 Personal history of other mental and behavioral disorders: Secondary | ICD-10-CM | POA: Insufficient documentation

## 2015-06-13 DIAGNOSIS — Z923 Personal history of irradiation: Secondary | ICD-10-CM | POA: Diagnosis not present

## 2015-06-13 DIAGNOSIS — K219 Gastro-esophageal reflux disease without esophagitis: Secondary | ICD-10-CM | POA: Diagnosis not present

## 2015-06-13 DIAGNOSIS — Z8739 Personal history of other diseases of the musculoskeletal system and connective tissue: Secondary | ICD-10-CM | POA: Insufficient documentation

## 2015-06-13 DIAGNOSIS — Z931 Gastrostomy status: Secondary | ICD-10-CM | POA: Diagnosis not present

## 2015-06-13 DIAGNOSIS — Z8639 Personal history of other endocrine, nutritional and metabolic disease: Secondary | ICD-10-CM | POA: Diagnosis not present

## 2015-06-13 DIAGNOSIS — Z79899 Other long term (current) drug therapy: Secondary | ICD-10-CM | POA: Insufficient documentation

## 2015-06-13 DIAGNOSIS — Z87448 Personal history of other diseases of urinary system: Secondary | ICD-10-CM | POA: Insufficient documentation

## 2015-06-13 DIAGNOSIS — R1013 Epigastric pain: Secondary | ICD-10-CM

## 2015-06-13 DIAGNOSIS — Z862 Personal history of diseases of the blood and blood-forming organs and certain disorders involving the immune mechanism: Secondary | ICD-10-CM | POA: Insufficient documentation

## 2015-06-13 DIAGNOSIS — Z87891 Personal history of nicotine dependence: Secondary | ICD-10-CM | POA: Insufficient documentation

## 2015-06-13 DIAGNOSIS — Z85818 Personal history of malignant neoplasm of other sites of lip, oral cavity, and pharynx: Secondary | ICD-10-CM | POA: Insufficient documentation

## 2015-06-13 DIAGNOSIS — G8929 Other chronic pain: Secondary | ICD-10-CM | POA: Diagnosis not present

## 2015-06-13 LAB — COMPREHENSIVE METABOLIC PANEL
ALBUMIN: 4.4 g/dL (ref 3.5–5.0)
ALT: 20 U/L (ref 17–63)
ANION GAP: 8 (ref 5–15)
AST: 25 U/L (ref 15–41)
Alkaline Phosphatase: 60 U/L (ref 38–126)
BILIRUBIN TOTAL: 0.6 mg/dL (ref 0.3–1.2)
BUN: 20 mg/dL (ref 6–20)
CHLORIDE: 104 mmol/L (ref 101–111)
CO2: 29 mmol/L (ref 22–32)
Calcium: 9.3 mg/dL (ref 8.9–10.3)
Creatinine, Ser: 1.56 mg/dL — ABNORMAL HIGH (ref 0.61–1.24)
GFR calc Af Amer: 59 mL/min — ABNORMAL LOW (ref >60)
GFR calc non Af Amer: 51 mL/min — ABNORMAL LOW (ref >60)
GLUCOSE: 89 mg/dL (ref 65–99)
POTASSIUM: 4.1 mmol/L (ref 3.5–5.1)
SODIUM: 141 mmol/L (ref 135–145)
TOTAL PROTEIN: 7.8 g/dL (ref 6.5–8.1)

## 2015-06-13 LAB — URINALYSIS, ROUTINE W REFLEX MICROSCOPIC
Bilirubin Urine: NEGATIVE
GLUCOSE, UA: NEGATIVE mg/dL
Hgb urine dipstick: NEGATIVE
KETONES UR: NEGATIVE mg/dL
Nitrite: NEGATIVE
Protein, ur: 30 mg/dL — AB
Specific Gravity, Urine: 1.027 (ref 1.005–1.030)
pH: 7 (ref 5.0–8.0)

## 2015-06-13 LAB — CBC
HEMATOCRIT: 37.6 % — AB (ref 39.0–52.0)
HEMOGLOBIN: 12.4 g/dL — AB (ref 13.0–17.0)
MCH: 32.4 pg (ref 26.0–34.0)
MCHC: 33 g/dL (ref 30.0–36.0)
MCV: 98.2 fL (ref 78.0–100.0)
PLATELETS: 231 10*3/uL (ref 150–400)
RBC: 3.83 MIL/uL — ABNORMAL LOW (ref 4.22–5.81)
RDW: 15.7 % — ABNORMAL HIGH (ref 11.5–15.5)
WBC: 5.3 10*3/uL (ref 4.0–10.5)

## 2015-06-13 LAB — URINE MICROSCOPIC-ADD ON

## 2015-06-13 LAB — LIPASE, BLOOD: LIPASE: 18 U/L (ref 11–51)

## 2015-06-13 MED ORDER — METOCLOPRAMIDE HCL 5 MG/ML IJ SOLN
10.0000 mg | Freq: Once | INTRAMUSCULAR | Status: AC
  Administered 2015-06-13: 10 mg via INTRAVENOUS
  Filled 2015-06-13: qty 2

## 2015-06-13 MED ORDER — MORPHINE SULFATE (PF) 4 MG/ML IV SOLN
4.0000 mg | Freq: Once | INTRAVENOUS | Status: AC
  Administered 2015-06-13: 4 mg via INTRAVENOUS
  Filled 2015-06-13: qty 1

## 2015-06-13 MED ORDER — DIPHENHYDRAMINE HCL 50 MG/ML IJ SOLN
25.0000 mg | Freq: Once | INTRAMUSCULAR | Status: AC
Start: 2015-06-13 — End: 2015-06-13
  Administered 2015-06-13: 25 mg via INTRAVENOUS
  Filled 2015-06-13: qty 1

## 2015-06-13 MED ORDER — SODIUM CHLORIDE 0.9 % IV BOLUS (SEPSIS)
1000.0000 mL | Freq: Once | INTRAVENOUS | Status: AC
  Administered 2015-06-13: 1000 mL via INTRAVENOUS

## 2015-06-13 MED ORDER — OMEPRAZOLE 20 MG PO CPDR: 20.0000 mg | DELAYED_RELEASE_CAPSULE | Freq: Every day | ORAL | Status: DC

## 2015-06-13 MED ORDER — HEPARIN SOD (PORK) LOCK FLUSH 100 UNIT/ML IV SOLN
500.0000 [IU] | Freq: Once | INTRAVENOUS | Status: AC
Start: 2015-06-14 — End: 2015-06-13
  Administered 2015-06-13: 500 [IU]
  Filled 2015-06-13: qty 5

## 2015-06-13 MED ORDER — ONDANSETRON 4 MG PO TBDP: 4.0000 mg | ORAL_TABLET | Freq: Three times a day (TID) | ORAL | Status: DC | PRN

## 2015-06-13 MED ORDER — GI COCKTAIL ~~LOC~~
30.0000 mL | Freq: Once | ORAL | Status: AC
  Administered 2015-06-13: 30 mL via ORAL
  Filled 2015-06-13: qty 30

## 2015-06-13 NOTE — Discharge Instructions (Signed)
Gastroesophageal Reflux Disease, Adult Normally, food travels down the esophagus and stays in the stomach to be digested. However, when a person has gastroesophageal reflux disease (GERD), food and stomach acid move back up into the esophagus. When this happens, the esophagus becomes sore and inflamed. Over time, GERD can create small holes (ulcers) in the lining of the esophagus.  CAUSES This condition is caused by a problem with the muscle between the esophagus and the stomach (lower esophageal sphincter, or LES). Normally, the LES muscle closes after food passes through the esophagus to the stomach. When the LES is weakened or abnormal, it does not close properly, and that allows food and stomach acid to go back up into the esophagus. The LES can be weakened by certain dietary substances, medicines, and medical conditions, including:  Tobacco use.  Pregnancy.  Having a hiatal hernia.  Heavy alcohol use.  Certain foods and beverages, such as coffee, chocolate, onions, and peppermint. RISK FACTORS This condition is more likely to develop in:  People who have an increased body weight.  People who have connective tissue disorders.  People who use NSAID medicines. SYMPTOMS Symptoms of this condition include:  Heartburn.  Difficult or painful swallowing.  The feeling of having a lump in the throat.  Abitter taste in the mouth.  Bad breath.  Having a large amount of saliva.  Having an upset or bloated stomach.  Belching.  Chest pain.  Shortness of breath or wheezing.  Ongoing (chronic) cough or a night-time cough.  Wearing away of tooth enamel.  Weight loss. Different conditions can cause chest pain. Make sure to see your health care provider if you experience chest pain. DIAGNOSIS Your health care provider will take a medical history and perform a physical exam. To determine if you have mild or severe GERD, your health care provider may also monitor how you respond  to treatment. You may also have other tests, including:  An endoscopy toexamine your stomach and esophagus with a small camera.  A test thatmeasures the acidity level in your esophagus.  A test thatmeasures how much pressure is on your esophagus.  A barium swallow or modified barium swallow to show the shape, size, and functioning of your esophagus. TREATMENT The goal of treatment is to help relieve your symptoms and to prevent complications. Treatment for this condition may vary depending on how severe your symptoms are. Your health care provider may recommend:  Changes to your diet.  Medicine.  Surgery. HOME CARE INSTRUCTIONS Diet  Follow a diet as recommended by your health care provider. This may involve avoiding foods and drinks such as:  Coffee and tea (with or without caffeine).  Drinks that containalcohol.  Energy drinks and sports drinks.  Carbonated drinks or sodas.  Chocolate and cocoa.  Peppermint and mint flavorings.  Garlic and onions.  Horseradish.  Spicy and acidic foods, including peppers, chili powder, curry powder, vinegar, hot sauces, and barbecue sauce.  Citrus fruit juices and citrus fruits, such as oranges, lemons, and limes.  Tomato-based foods, such as red sauce, chili, salsa, and pizza with red sauce.  Fried and fatty foods, such as donuts, french fries, potato chips, and high-fat dressings.  High-fat meats, such as hot dogs and fatty cuts of red and white meats, such as rib eye steak, sausage, ham, and bacon.  High-fat dairy items, such as whole milk, butter, and cream cheese.  Eat small, frequent meals instead of large meals.  Avoid drinking large amounts of liquid with your  meals.  Avoid eating meals during the 2-3 hours before bedtime.  Avoid lying down right after you eat.  Do not exercise right after you eat. General Instructions  Pay attention to any changes in your symptoms.  Take over-the-counter and prescription  medicines only as told by your health care provider. Do not take aspirin, ibuprofen, or other NSAIDs unless your health care provider told you to do so.  Do not use any tobacco products, including cigarettes, chewing tobacco, and e-cigarettes. If you need help quitting, ask your health care provider.  Wear loose-fitting clothing. Do not wear anything tight around your waist that causes pressure on your abdomen.  Raise (elevate) the head of your bed 6 inches (15cm).  Try to reduce your stress, such as with yoga or meditation. If you need help reducing stress, ask your health care provider.  If you are overweight, reduce your weight to an amount that is healthy for you. Ask your health care provider for guidance about a safe weight loss goal.  Keep all follow-up visits as told by your health care provider. This is important. SEEK MEDICAL CARE IF:  You have new symptoms.  You have unexplained weight loss.  You have difficulty swallowing, or it hurts to swallow.  You have wheezing or a persistent cough.  Your symptoms do not improve with treatment.  You have a hoarse voice. SEEK IMMEDIATE MEDICAL CARE IF:  You have pain in your arms, neck, jaw, teeth, or back.  You feel sweaty, dizzy, or light-headed.  You have chest pain or shortness of breath.  You vomit and your vomit looks like blood or coffee grounds.  You faint.  Your stool is bloody or black.  You cannot swallow, drink, or eat.   This information is not intended to replace advice given to you by your health care provider. Make sure you discuss any questions you have with your health care provider.   Document Released: 01/12/2005 Document Revised: 12/24/2014 Document Reviewed: 07/30/2014 Elsevier Interactive Patient Education 2016 Else[employer]  Abdominal Pain, Adult Many things can cause abdominal pain. Usually, abdominal pain is not caused by a disease and will improve without treatment. It can often be observed  and treated at home. Your health care provider will do a physical exam and possibly order blood tests and X-rays to help determine the seriousness of your pain. However, in many cases, more time must pass before a clear cause of the pain can be found. Before that point, your health care provider may not know if you need more testing or further treatment. HOME CARE INSTRUCTIONS Monitor your abdominal pain for any changes. The following actions may help to alleviate any discomfort you are experiencing:  Only take over-the-counter or prescription medicines as directed by your health care provider.  Do not take laxatives unless directed to do so by your health care provider.  Try a clear liquid diet (broth, tea, or water) as directed by your health care provider. Slowly move to a bland diet as tolerated. SEEK MEDICAL CARE IF:  You have unexplained abdominal pain.  You have abdominal pain associated with nausea or diarrhea.  You have pain when you urinate or have a bowel movement.  You experience abdominal pain that wakes you in the night.  You have abdominal pain that is worsened or improved by eating food.  You have abdominal pain that is worsened with eating fatty foods.  You have a fever. SEEK IMMEDIATE MEDICAL CARE IF:  Your pain does  not go away within 2 hours.  You keep throwing up (vomiting).  Your pain is felt only in portions of the abdomen, such as the right side or the left lower portion of the abdomen.  You pass bloody or black tarry stools. MAKE SURE YOU:  Understand these instructions.  Will watch your condition.  Will get help right away if you are not doing well or get worse.   This information is not intended to replace advice given to you by your health care provider. Make sure you discuss any questions you have with your health care provider.   Document Released: 01/12/2005 Document Revised: 12/24/2014 Document Reviewed: 12/12/2012 Elsevier Interactive  Patient Education [employer].

## 2015-06-13 NOTE — ED Provider Notes (Signed)
CSN: [id number]     Arrival date & time 06/13/15  1803 History   First MD Initiated Contact with Patient 06/13/15 2044     Chief Complaint  Patient presents with  . Abdominal Pain  . Recent chemo   . Emesis   Mason [name] is a 50 y.o. 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 Patient reports he has pain right after eating. He reports he feels hungry currently. He reports he was having epigastric abdominal pain but this has since resolved. He reports currently he has 8 out of 10 pain to his right throat which is chronic for him due to his cancer. He last received chemotherapy 20 days ago. He reports he feels like he does after he receives chemotherapy. He reports one episode of loose stool today. He is passing gas. Patient denies fevers, current abdominal pain, urinary symptoms, trouble swallowing, hematemesis, hematochezia, chest pain, shortness of breath, or rashes.   Patient is a 50 y.o. male presenting with abdominal pain and vomiting. The history is provided by the patient and medical records. No language interpreter was used.  Abdominal Pain Associated symptoms: nausea and vomiting   Associated symptoms: no chest pain, no chills, no cough, no diarrhea, no dysuria, no fever, no hematuria, no shortness of breath and no sore throat   Emesis Associated symptoms: abdominal pain   Associated symptoms: no chills, no diarrhea, no headaches and no sore throat     Past Medical History  Diagnosis Date  . Knee pain, chronic   . Anxiety     mild new dx  . Arthritis     knees,hips  . Concussion     Hx: in high school x 2  . Hypertension   . PEG (percutaneous endoscopic gastrostomy) status ([place])   . Malnutrition ([place])   . Bilateral edema of lower extremity   . Abnormal liver function test   . Anemia   . Constipation   . Hypokalemia   . Fever   .  Hypoglycemia   . Renal failure, acute ([place])   . Severe nausea and vomiting   . Hyperactive gag reflex   . Status post chemotherapy     Only received 2 doses due to uncontrolled nausea and acute renal failure  . S/P radiation therapy 08/19/2013-10/15/2013    Right Tonstil and bilateral neck / 70 Gy in 35 fractions to gross disease, 63 Gy in 35 fractions to high risk nodal echelons, and 56 Gy in 35 fractions to intermediate risk nodal echelons  . Non-healing surgical wound 05/23/2014  . Complication of anesthesia     Pt stated " my oxygen level was slow in rising."  . 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 Acute sinusitis, unspecified 05/18/2015   Past Surgical History  Procedure Laterality Date  . Multiple extractions with alveoloplasty N/A 08/01/2013    Procedure: Extraction of tooth #'s 1,15,17,31, 32 with alveoloplasty, mandibular left torus reduction, and gross debridement of remaining teeth.;  Surgeon: [name], DDS;  Location: [facility];  Service: Oral Surgery;  Laterality: N/A;  . Laparoscopic gastrostomy N/A 08/15/2013    Procedure: LAPAROSCOPIC GASTROSTOMY TUBE PLACEMENT;  Surgeon: [name], MD;  Location: [facility];  Service: General;  Laterality: N/A;  . Portacath placement Left 08/15/2013    Procedure: INSERTION PORT-A-CATH;  Surgeon: [name], MD;  Location:  [facility];  Service: General;  Laterality: Left;  . Lymph node biopsy  03/20/14    right neck  . Port-a-cath removal N/A 05/13/2014    Procedure: REMOVAL of PORT-A-CATH;  Surgeon: [name], MD;  Location: [facility];  Service: General;  Laterality: N/A;   Family History  Problem Relation Age of Onset  . CVA    . Diabetes    . Heart attack    . Arthritis Father   . Arthritis Mother    Social History  Substance Use Topics  . Smoking status: Former Smoker -- 1.00 packs/day for 20 years    Types: Cigarettes    Quit date: 07/16/2003  . Smokeless tobacco: Never Used  . Alcohol  Use: No     Comment: none years ago    Review of Systems  Constitutional: Negative for fever and chills.  HENT: Negative for congestion and sore throat.   Eyes: Negative for visual disturbance.  Respiratory: Negative for cough, shortness of breath and wheezing.   Cardiovascular: Negative for chest pain.  Gastrointestinal: Positive for nausea, vomiting and abdominal pain. Negative for diarrhea and blood in stool.  Genitourinary: Negative for dysuria, frequency, hematuria and difficulty urinating.  Musculoskeletal: Negative for back pain and neck pain.  Skin: Negative for rash.  Neurological: Negative for dizziness, syncope, weakness, light-headedness and headaches.      Allergies  Pollen extract  Home Medications   Prior to Admission medications   Medication Sig Start Date End Date Taking? Authorizing Provider  acetaminophen (TYLENOL) 325 MG tablet Take 325-650 mg by mouth every 6 (six) hours as needed (pain).   Yes Historical Provider, MD  fluticasone (FLONASE) 50 MCG/ACT nasal spray Place 2 sprays into both nostrils daily. Patient taking differently: Place 2 sprays into both nostrils daily as needed for allergies.  07/25/14  Yes Lorayne Marek, MD  lidocaine-prilocaine (EMLA) cream Apply 1 application topically as needed. 12/02/14  Yes [name], MD  Multiple Vitamin (MULTIVITAMIN WITH MINERALS) TABS tablet Take 1 tablet by mouth daily.   Yes Historical Provider, MD  ondansetron (ZOFRAN) 8 MG tablet Take 1 tablet (8 mg total) by mouth every 8 (eight) hours as needed for nausea or vomiting. 02/11/15  Yes [name], MD  Oxycodone HCl 10 MG TABS Take 1 tablet (10 mg total) by mouth every 6 (six) hours as needed. Patient taking differently: Take 10 mg by mouth every 6 (six) hours as needed (pain).  05/25/15  Yes [name], MD  prochlorperazine (COMPAZINE) 10 MG tablet Take 1 tablet (10 mg total) by mouth every 6 (six) hours as needed (Nausea or vomiting). 02/11/15  Yes [name], MD   amoxicillin-clavulanate (AUGMENTIN) 875-125 MG tablet Take 1 tablet by mouth 2 (two) times daily. Patient not taking: Reported on 06/13/2015 05/18/15   [name], MD  omeprazole (PRILOSEC) 20 MG capsule Take 1 capsule (20 mg total) by mouth daily. 06/13/15   Waynetta Pean, PA-C  ondansetron (ZOFRAN ODT) 4 MG disintegrating tablet Take 1 tablet (4 mg total) by mouth every 8 (eight) hours as needed for nausea or vomiting. 06/13/15   Waynetta Pean, PA-C   BP 139/91 mmHg  Pulse 75  Temp(Src) 98.2 F (36.8 C) (Oral)  Resp 16  SpO2 99% Physical Exam  Constitutional: He appears well-developed and well-nourished. No distress.  Nontoxic appearing.  HENT:  Head: Normocephalic and atraumatic.  Mouth/Throat: Oropharynx is clear and moist.  Eyes: Conjunctivae are normal. Pupils are equal, round, and reactive to light. Right eye  exhibits no discharge. Left eye exhibits no discharge.  Neck: Neck supple.  Cardiovascular: Normal rate, regular rhythm, normal heart sounds and intact distal pulses.  Exam reveals no gallop and no friction rub.   No murmur heard. Pulmonary/Chest: Effort normal and breath sounds normal. No respiratory distress. He has no wheezes. He has no rales.  Abdominal: Soft. Bowel sounds are normal. He exhibits no distension and no mass. There is no tenderness. There is no rebound and no guarding.  Abdomen is soft and nontender to palpation. Bowel sounds are present. No peritoneal signs.  Musculoskeletal: He exhibits no edema.  Lymphadenopathy:    He has no cervical adenopathy.  Neurological: He is alert. Coordination normal.  Skin: Skin is warm and dry. No rash noted. He is not diaphoretic. No erythema. No pallor.  Psychiatric: He has a normal mood and affect. His behavior is normal.  Nursing note and vitals reviewed.   ED Course  Procedures (including critical care time) Labs Review Labs Reviewed  COMPREHENSIVE METABOLIC PANEL - Abnormal; Notable for the following:     Creatinine, Ser 1.56 (*)    GFR calc non Af Amer 51 (*)    GFR calc Af Amer 59 (*)    All other components within normal limits  CBC - Abnormal; Notable for the following:    RBC 3.83 (*)    Hemoglobin 12.4 (*)    HCT 37.6 (*)    RDW 15.7 (*)    All other components within normal limits  URINALYSIS, ROUTINE W REFLEX MICROSCOPIC (NOT AT [facility]) - Abnormal; Notable for the following:    Protein, ur 30 (*)    Leukocytes, UA TRACE (*)    All other components within normal limits  URINE MICROSCOPIC-ADD ON - Abnormal; Notable for the following:    Squamous Epithelial / LPF 0-5 (*)    Bacteria, UA FEW (*)    All other components within normal limits  LIPASE, BLOOD    Imaging Review No results found. I have personally reviewed and evaluated these lab results as part of my medical decision-making.   EKG Interpretation None      Filed Vitals:   06/13/15 1823 06/13/15 2211 06/13/15 2353  BP: 131/95 155/96 139/91  Pulse: 92 80 75  Temp: 98.5 F (36.9 C)  98.2 F (36.8 C)  TempSrc: Oral  Oral  Resp: 17 16 16   SpO2: 100% 100% 99%     MDM   Meds given in ED:  Medications  sodium chloride 0.9 % bolus 1,000 mL (0 mLs Intravenous Stopped 06/13/15 2352)  morphine 4 MG/ML injection 4 mg (4 mg Intravenous Given 06/13/15 2127)  metoCLOPramide (REGLAN) injection 10 mg (10 mg Intravenous Given 06/13/15 2127)  diphenhydrAMINE (BENADRYL) injection 25 mg (25 mg Intravenous Given 06/13/15 2127)  gi cocktail (Maalox,Lidocaine,Donnatal) (30 mLs Oral Given 06/13/15 2208)  heparin lock flush 100 unit/mL (500 Units Intracatheter Given 06/13/15 2352)    Discharge Medication List as of 06/13/2015 11:27 PM    START taking these medications   Details  omeprazole (PRILOSEC) 20 MG capsule Take 1 capsule (20 mg total) by mouth daily., Starting 06/13/2015, Until Discontinued, Print    ondansetron (ZOFRAN ODT) 4 MG disintegrating tablet Take 1 tablet (4 mg total) by mouth every 8 (eight) hours as needed for  nausea or vomiting., Starting 06/13/2015, Until Discontinued, Print        Final diagnoses:  Epigastric abdominal pain  Gastroesophageal reflux disease, esophagitis presence not specified   This  is  a 50 y.o. 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 Patient reports he has pain right after eating. He reports he feels hungry currently. He reports he was having epigastric abdominal pain but this has since resolved. He reports currently he has 8 out of 10 pain to his right throat which is chronic for him due to his cancer. He last received chemotherapy 20 days ago.  On exam patient is afebrile and nontoxic appearing. His lungs clear to auscultation bilaterally. His abdomen is soft and nontender to palpation. Urinalysis indicated trace leukocytes. He has no urinary symptoms. CMP reveals a creatinine of 1.56 which is at his baseline. It is otherwise unremarkable. CBC shows no leukocytosis. Stable hemoglobin and hematocrit. Patient has no fever, no leukocytosis. No abdominal tenderness palpation. He reports great relief after GI cocktail. He is able to tolerate by mouth liquids prior to discharge. No further vomiting. Suspect gastritis, peptic ulcer or acid reflux. We'll start on omeprazole and broad with prescription for Zofran for nausea. I encouraged close follow-up by his oncologist and primary care provider. Discussed strict and specific return precautions. I advised the patient to follow-up with their primary care provider this week. I advised the patient to return to the emergency department with new or worsening symptoms or new concerns. The patient verbalized understanding and agreement with plan.    This patient was discussed with Dr. Zenia Resides who agrees with assessment and plan.   Waynetta Pean, PA-C 06/14/15 0308  Lacretia Leigh, MD 06/14/15 2222

## 2015-06-13 NOTE — ED Notes (Signed)
Bed: WA14 Expected date:  Expected time:  Means of arrival:  Comments: Hold for triage 1 

## 2015-06-13 NOTE — ED Notes (Signed)
Pt hx of throat CA, chemo on 2/6. Pt began having epigastric pain with N/V/D yesterday. Pt states he did take 8 mg zofran at 15:00 before coming here.

## 2015-06-15 ENCOUNTER — Encounter: Payer: Self-pay | Admitting: *Deleted

## 2015-06-15 ENCOUNTER — Other Ambulatory Visit (HOSPITAL_BASED_OUTPATIENT_CLINIC_OR_DEPARTMENT_OTHER): Payer: Medicaid Other

## 2015-06-15 ENCOUNTER — Ambulatory Visit: Payer: Medicaid Other

## 2015-06-15 ENCOUNTER — Ambulatory Visit (HOSPITAL_BASED_OUTPATIENT_CLINIC_OR_DEPARTMENT_OTHER): Payer: Medicaid Other

## 2015-06-15 ENCOUNTER — Encounter: Payer: Self-pay | Admitting: Hematology and Oncology

## 2015-06-15 ENCOUNTER — Telehealth: Payer: Self-pay | Admitting: Hematology and Oncology

## 2015-06-15 ENCOUNTER — Ambulatory Visit: Payer: Medicaid Other | Admitting: Nutrition

## 2015-06-15 ENCOUNTER — Ambulatory Visit (HOSPITAL_BASED_OUTPATIENT_CLINIC_OR_DEPARTMENT_OTHER): Payer: Medicaid Other | Admitting: Hematology and Oncology

## 2015-06-15 VITALS — BP 129/85 | HR 80 | Temp 97.6°F | Resp 18 | Ht 78.0 in | Wt 239.9 lb

## 2015-06-15 DIAGNOSIS — C099 Malignant neoplasm of tonsil, unspecified: Secondary | ICD-10-CM

## 2015-06-15 DIAGNOSIS — G4700 Insomnia, unspecified: Secondary | ICD-10-CM

## 2015-06-15 DIAGNOSIS — N182 Chronic kidney disease, stage 2 (mild): Secondary | ICD-10-CM

## 2015-06-15 DIAGNOSIS — R112 Nausea with vomiting, unspecified: Secondary | ICD-10-CM | POA: Diagnosis not present

## 2015-06-15 DIAGNOSIS — Z5111 Encounter for antineoplastic chemotherapy: Secondary | ICD-10-CM | POA: Diagnosis not present

## 2015-06-15 DIAGNOSIS — D630 Anemia in neoplastic disease: Secondary | ICD-10-CM | POA: Diagnosis not present

## 2015-06-15 DIAGNOSIS — G893 Neoplasm related pain (acute) (chronic): Secondary | ICD-10-CM

## 2015-06-15 HISTORY — DX: Insomnia, unspecified: G47.00

## 2015-06-15 LAB — CBC WITH DIFFERENTIAL/PLATELET
BASO%: 0.4 % (ref 0.0–2.0)
BASOS ABS: 0 10*3/uL (ref 0.0–0.1)
EOS%: 1.3 % (ref 0.0–7.0)
Eosinophils Absolute: 0.1 10*3/uL (ref 0.0–0.5)
HEMATOCRIT: 35.3 % — AB (ref 38.4–49.9)
HGB: 11.7 g/dL — ABNORMAL LOW (ref 13.0–17.1)
LYMPH#: 0.6 10*3/uL — AB (ref 0.9–3.3)
LYMPH%: 13.4 % — AB (ref 14.0–49.0)
MCH: 32 pg (ref 27.2–33.4)
MCHC: 33.1 g/dL (ref 32.0–36.0)
MCV: 96.7 fL (ref 79.3–98.0)
MONO#: 0.6 10*3/uL (ref 0.1–0.9)
MONO%: 13.8 % (ref 0.0–14.0)
NEUT#: 3.2 10*3/uL (ref 1.5–6.5)
NEUT%: 71.1 % (ref 39.0–75.0)
PLATELETS: 216 10*3/uL (ref 140–400)
RBC: 3.65 10*6/uL — AB (ref 4.20–5.82)
RDW: 17.4 % — ABNORMAL HIGH (ref 11.0–14.6)
WBC: 4.5 10*3/uL (ref 4.0–10.3)

## 2015-06-15 LAB — COMPREHENSIVE METABOLIC PANEL
ALBUMIN: 3.9 g/dL (ref 3.5–5.0)
ALK PHOS: 59 U/L (ref 40–150)
ALT: 19 U/L (ref 0–55)
ANION GAP: 8 meq/L (ref 3–11)
AST: 26 U/L (ref 5–34)
BUN: 19 mg/dL (ref 7.0–26.0)
CO2: 25 mEq/L (ref 22–29)
Calcium: 8.9 mg/dL (ref 8.4–10.4)
Chloride: 106 mEq/L (ref 98–109)
Creatinine: 1.4 mg/dL — ABNORMAL HIGH (ref 0.7–1.3)
EGFR: 67 mL/min/{1.73_m2} — AB (ref >90)
GLUCOSE: 80 mg/dL (ref 70–140)
POTASSIUM: 4.2 meq/L (ref 3.5–5.1)
Sodium: 139 mEq/L (ref 136–145)
Total Bilirubin: 0.52 mg/dL (ref 0.20–1.20)
Total Protein: 7.3 g/dL (ref 6.4–8.3)

## 2015-06-15 MED ORDER — FAMOTIDINE IN NACL 20-0.9 MG/50ML-% IV SOLN
INTRAVENOUS | Status: AC
  Filled 2015-06-15: qty 50

## 2015-06-15 MED ORDER — TRAZODONE HCL 50 MG PO TABS: 50.0000 mg | ORAL_TABLET | Freq: Every day | ORAL | Status: DC

## 2015-06-15 MED ORDER — PALONOSETRON HCL INJECTION 0.25 MG/5ML
INTRAVENOUS | Status: AC
  Filled 2015-06-15: qty 5

## 2015-06-15 MED ORDER — SODIUM CHLORIDE 0.9% FLUSH
10.0000 mL | INTRAVENOUS | Status: DC | PRN
  Administered 2015-06-15: 10 mL via INTRAVENOUS
  Filled 2015-06-15: qty 10

## 2015-06-15 MED ORDER — DIPHENHYDRAMINE HCL 50 MG/ML IJ SOLN
INTRAMUSCULAR | Status: AC
  Filled 2015-06-15: qty 1

## 2015-06-15 MED ORDER — HEPARIN SOD (PORK) LOCK FLUSH 100 UNIT/ML IV SOLN
500.0000 [IU] | Freq: Once | INTRAVENOUS | Status: AC | PRN
  Administered 2015-06-15: 500 [IU]
  Filled 2015-06-15: qty 5

## 2015-06-15 MED ORDER — DIPHENHYDRAMINE HCL 50 MG/ML IJ SOLN
50.0000 mg | Freq: Once | INTRAMUSCULAR | Status: AC
  Administered 2015-06-15: 50 mg via INTRAVENOUS

## 2015-06-15 MED ORDER — OXYCODONE HCL 10 MG PO TABS: 10.0000 mg | ORAL_TABLET | Freq: Four times a day (QID) | ORAL | Status: DC | PRN

## 2015-06-15 MED ORDER — SODIUM CHLORIDE 0.9 % IJ SOLN
10.0000 mL | INTRAMUSCULAR | Status: DC | PRN
  Administered 2015-06-15: 10 mL
  Filled 2015-06-15: qty 10

## 2015-06-15 MED ORDER — PACLITAXEL CHEMO INJECTION 300 MG/50ML
45.0000 mg/m2 | Freq: Once | INTRAVENOUS | Status: AC
  Administered 2015-06-15: 108 mg via INTRAVENOUS
  Filled 2015-06-15: qty 18

## 2015-06-15 MED ORDER — SODIUM CHLORIDE 0.9 % IV SOLN
10.0000 mg | Freq: Once | INTRAVENOUS | Status: AC
  Administered 2015-06-15: 10 mg via INTRAVENOUS
  Filled 2015-06-15: qty 1

## 2015-06-15 MED ORDER — CARBOPLATIN CHEMO INJECTION 450 MG/45ML
242.0000 mg | Freq: Once | INTRAVENOUS | Status: AC
  Administered 2015-06-15: 240 mg via INTRAVENOUS
  Filled 2015-06-15: qty 24

## 2015-06-15 MED ORDER — PALONOSETRON HCL INJECTION 0.25 MG/5ML
0.2500 mg | Freq: Once | INTRAVENOUS | Status: AC
  Administered 2015-06-15: 0.25 mg via INTRAVENOUS

## 2015-06-15 MED ORDER — SODIUM CHLORIDE 0.9 % IV SOLN
Freq: Once | INTRAVENOUS | Status: AC
  Administered 2015-06-15: 11:00:00 via INTRAVENOUS

## 2015-06-15 MED ORDER — FAMOTIDINE IN NACL 20-0.9 MG/50ML-% IV SOLN
20.0000 mg | Freq: Once | INTRAVENOUS | Status: AC
  Administered 2015-06-15: 20 mg via INTRAVENOUS

## 2015-06-15 MED ORDER — ONDANSETRON HCL 8 MG PO TABS: 8.0000 mg | ORAL_TABLET | Freq: Three times a day (TID) | ORAL | Status: DC | PRN

## 2015-06-15 MED FILL — traZODone HCL 50 MG TABS: 50 | 30 days supply | Qty: 30 | Fill #0

## 2015-06-15 MED FILL — oxyCODONE HCL 10 MG TABS: 10 | 22 days supply | Qty: 90 | Fill #0

## 2015-06-15 MED FILL — ONDANSETRON HCL 8 MG TABLET: 8 | 30 days supply | Qty: 90 | Fill #0

## 2015-06-15 NOTE — Assessment & Plan Note (Signed)
His kidney function is stable. As long as his creatinine clearance remain greater than 30, we will continue chemotherapy. 

## 2015-06-15 NOTE — Assessment & Plan Note (Signed)
I recommend he uses Zofran 2 days after chemotherapy and if it gets worse, he can add Compazine as needed. I encouraged him to increase oral hydration.

## 2015-06-15 NOTE — Patient Instructions (Signed)
[facility] Discharge Instructions for Patients Receiving Chemotherapy  Today you received the following chemotherapy agents Taxol/Carboplatin  To help prevent nausea and vomiting after your treatment, we encourage you to take your nausea medication    If you develop nausea and vomiting that is not controlled by your nausea medication, call the clinic.   BELOW ARE SYMPTOMS THAT SHOULD BE REPORTED IMMEDIATELY:  *FEVER GREATER THAN 100.5 F  *CHILLS WITH OR WITHOUT FEVER  NAUSEA AND VOMITING THAT IS NOT CONTROLLED WITH YOUR NAUSEA MEDICATION  *UNUSUAL SHORTNESS OF BREATH  *UNUSUAL BRUISING OR BLEEDING  TENDERNESS IN MOUTH AND THROAT WITH OR WITHOUT PRESENCE OF ULCERS  *URINARY PROBLEMS  *BOWEL PROBLEMS  UNUSUAL RASH Items with * indicate a potential emergency and should be followed up as soon as possible.  Feel free to call the clinic you have any questions or concerns. The clinic phone number is [phone].  Please show the CHEMO ALERT CARD at check-in to the Emergency Department and triage nurse.   

## 2015-06-15 NOTE — Assessment & Plan Note (Signed)
He has poorly controlled pain with Percocet. I recommend increasing his pain medication to IR morphine to be taking along with Tylenol for pain control.  he felt that IR morphine caused too much nausea and I switched him to oxycodone with good success. I refilled his pain medications

## 2015-06-15 NOTE — Progress Notes (Signed)
Pt nauseated & vomited x 1 before chemo but felt better at d/c & was able to eat some chicken noodle soup & drink some gingerale.

## 2015-06-15 NOTE — Progress Notes (Signed)
Nutrition follow-up completed with patient during infusion. Weight decreased and documented as 239 pounds February 27, down from 246.1 pounds January 30. Patient states he had nausea and vomiting all weekend. Patient had emesis this morning right before infusion began. States he was tolerating Jell-O and applesauce and Ensure Plus over the weekend.  Nutrition diagnosis: Inadequate oral intake continues.  Intervention: Patient educated to follow a bland liquid diet until nausea resolves. If patient tolerates Ensure Plus, recommend as tolerated. Provided third and final complementary case of Ensure Plus. Teach back method was used.  Monitoring, evaluation, goals: Patient will work to increase oral intake and have resolution of nausea and vomiting.  Next visit: To be scheduled as needed.  **Disclaimer: This note was dictated with voice recognition software. Similar sounding words can inadvertently be transcribed and this note may contain transcription errors which may not have been corrected upon publication of note.**

## 2015-06-15 NOTE — Assessment & Plan Note (Signed)
He tolerated treatment well and part from mild intermittent treatment related nausea and vomiting and pancytopenia. I recommend we continue the same treatment but modify to 2 weeks and 1 week off for another 3 months and repeat another PET CT scan. Last PET scan from January 2017 showed positive response to treatment.

## 2015-06-15 NOTE — Patient Instructions (Signed)

## 2015-06-15 NOTE — Telephone Encounter (Signed)
Scheduled patient appt per pof, avs report printed.  ° °Staff message sent to Michelle  °

## 2015-06-15 NOTE — Assessment & Plan Note (Signed)
This is likely anemia of chronic disease. The patient denies recent history of bleeding such as epistaxis, hematuria or hematochezia. He is asymptomatic from the anemia. We will observe for now.  

## 2015-06-15 NOTE — Progress Notes (Signed)
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 FNA of right level III lymph node was inconclusive for cancer   07/25/2013 Imaging PET scan showed locally advanced disease with abnormal lymphadenopathy in the right axilla   08/06/2013 Procedure CT-guided biopsy of the lymphadenopathy was negative for malignancy   08/15/2013 Surgery Patient has placement of port and feeding tube   08/19/2013 - 09/10/2013 Chemotherapy Patient received chemotherapy with cisplatin. The patient only received 2 doses due to uncontrolled nausea and acute renal failure.   08/19/2013 - 10/15/2013 Radiation Therapy Received Helical IMRT Tomotherapy:  Right Tonstil and bilateral neck / 70 Gy in 35 fractions to gross disease, 63 Gy in 35 fractions to high risk nodal echelons, and 56 Gy in 35 fractions to intermediate risk nodal echelons.   08/27/2013 - 08/30/2013 Hospital Admission The patient was admitted to the hospital for uncontrolled nausea vomiting and dehydration.   02/14/2014 Imaging PET/CT scan showed complete response to treatment   03/19/2014 Surgery He had excisional lymph node biopsy from the right neck. Pathology was benign   05/13/2014 Surgery He had removal of Port-A-Cath.   05/22/2014 Imaging Repeat CT scan of the neck show no evidence of disease recurrence.   12/09/2014 Imaging Ct neck without contrast showed persistent abnormalities on the right side of neck, indeterminate   12/25/2014 Imaging PET CT scan showed disease recurrence.   02/10/2015 Procedure He has placement of port   02/13/2015 -  Chemotherapy He received chemotherapy with carbo/Taxol   04/21/2015 Imaging PET CT scan showed improved disease control    INTERVAL HISTORY: Please see below for problem oriented charting. He returns for further follow-up prior to chemotherapy. Recently, he went to the emergency room because of uncontrolled nausea or vomiting. He denies further vomiting since then. He complained of insomnia and he is worrying about long-term plan. He admits feeling a little depressed but not suicidal. He has good network of support at home and at church  REVIEW OF SYSTEMS:   Constitutional: Denies fevers, chills or abnormal weight loss Eyes: Denies blurriness of vision Ears, nose, mouth, throat, and face: Denies mucositis or sore throat Respiratory: Denies cough, dyspnea or wheezes Cardiovascular: Denies palpitation, chest discomfort or lower extremity swelling Skin: Denies abnormal skin rashes Lymphatics: Denies new lymphadenopathy or easy bruising Neurological:Denies numbness, tingling or new weaknesses Behavioral/Psych: Mood is stable, no new changes  All other systems were reviewed with the patient and are negative.  I have reviewed the past medical history, past surgical history, social history and family history with the patient and they are unchanged from previous note.  ALLERGIES:  is allergic to pollen extract.  MEDICATIONS:  Current Outpatient Prescriptions  Medication Sig Dispense Refill  . acetaminophen (TYLENOL) 325 MG tablet Take 325-650 mg by  mouth every 6 (six) hours as needed (pain).    . fluticasone (FLONASE) 50 MCG/ACT nasal spray Place 2 sprays into both  nostrils daily. (Patient taking differently: Place 2 sprays into both nostrils daily as needed for allergies. ) 16 g 2  . lidocaine-prilocaine (EMLA) cream Apply 1 application topically as needed. 30 g 6  . Multiple Vitamin (MULTIVITAMIN WITH MINERALS) TABS tablet Take 1 tablet by mouth daily.    Marland Kitchen omeprazole (PRILOSEC) 20 MG capsule Take 1 capsule (20 mg total) by mouth daily. 30 capsule 0  . ondansetron (ZOFRAN ODT) 4 MG disintegrating tablet Take 1 tablet (4 mg total) by mouth every 8 (eight) hours as needed for nausea or vomiting. 10 tablet 0  . ondansetron (ZOFRAN) 8 MG tablet Take 1 tablet (8 mg total) by mouth every 8 (eight) hours as needed for nausea or vomiting. 90 tablet 1  . prochlorperazine (COMPAZINE) 10 MG tablet Take 1 tablet (10 mg total) by mouth every 6 (six) hours as needed (Nausea or vomiting). 30 tablet 1  . Oxycodone HCl 10 MG TABS Take 1 tablet (10 mg total) by mouth every 6 (six) hours as needed. 90 tablet 0  . traZODone (DESYREL) 50 MG tablet Take 1 tablet (50 mg total) by mouth at bedtime. 30 tablet 5   No current facility-administered medications for this visit.   Facility-Administered Medications Ordered in Other Visits  Medication Dose Route Frequency Provider Last Rate Last Dose  . sodium chloride 0.9 % injection 10 mL  10 mL Intracatheter PRN [name], MD   10 mL at 06/15/15 1408    PHYSICAL EXAMINATION: ECOG PERFORMANCE STATUS: 1 - Symptomatic but completely ambulatory  Filed Vitals:   06/15/15 1006  BP: 129/85  Pulse: 80  Temp: 97.6 F (36.4 C)  Resp: 18   Filed Weights   06/15/15 1006  Weight: 239 lb 14.4 oz (108.818 kg)    GENERAL:alert, no distress and comfortable SKIN: skin color, texture, turgor are normal, no rashes or significant lesions EYES: normal, Conjunctiva are pink and non-injected, sclera clear OROPHARYNX:no exudate, no erythema and lips, buccal mucosa, and tongue normal  NECK: supple, thyroid normal size, non-tender, without  nodularity LYMPH:  no palpable lymphadenopathy in the cervical, axillary or inguinal LUNGS: clear to auscultation and percussion with normal breathing effort HEART: regular rate & rhythm and no murmurs and no lower extremity edema ABDOMEN:abdomen soft, non-tender and normal bowel sounds Musculoskeletal:no cyanosis of digits and no clubbing  NEURO: alert & oriented x 3 with fluent speech, no focal motor/sensory deficits  LABORATORY DATA:  I have reviewed the data as listed    Component Value Date/Time   NA 139 06/15/2015 0951   NA 141 06/13/2015 1849   K 4.2 06/15/2015 0951   K 4.1 06/13/2015 1849   CL 104 06/13/2015 1849   CO2 25 06/15/2015 0951   CO2 29 06/13/2015 1849   GLUCOSE 80 06/15/2015 0951   GLUCOSE 89 06/13/2015 1849   BUN 19.0 06/15/2015 0951   BUN 20 06/13/2015 1849   CREATININE 1.4* 06/15/2015 0951   CREATININE 1.56* 06/13/2015 1849   CALCIUM 8.9 06/15/2015 0951   CALCIUM 9.3 06/13/2015 1849   PROT 7.3 06/15/2015 0951   PROT 7.8 06/13/2015 1849   ALBUMIN 3.9 06/15/2015 0951   ALBUMIN 4.4 06/13/2015 1849   AST 26 06/15/2015 0951   AST 25 06/13/2015 1849   ALT 19 06/15/2015 0951   ALT 20 06/13/2015 1849   ALKPHOS 59 06/15/2015 0951  ALKPHOS 60 06/13/2015 1849   BILITOT 0.52 06/15/2015 0951   BILITOT 0.6 06/13/2015 1849   GFRNONAA 51* 06/13/2015 1849   GFRAA 59* 06/13/2015 1849    No results found for: SPEP, UPEP  Lab Results  Component Value Date   WBC 4.5 06/15/2015   NEUTROABS 3.2 06/15/2015   HGB 11.7* 06/15/2015   HCT 35.3* 06/15/2015   MCV 96.7 06/15/2015   PLT 216 06/15/2015      Chemistry      Component Value Date/Time   NA 139 06/15/2015 0951   NA 141 06/13/2015 1849   K 4.2 06/15/2015 0951   K 4.1 06/13/2015 1849   CL 104 06/13/2015 1849   CO2 25 06/15/2015 0951   CO2 29 06/13/2015 1849   BUN 19.0 06/15/2015 0951   BUN 20 06/13/2015 1849   CREATININE 1.4* 06/15/2015 0951   CREATININE 1.56* 06/13/2015 1849      Component Value  Date/Time   CALCIUM 8.9 06/15/2015 0951   CALCIUM 9.3 06/13/2015 1849   ALKPHOS 59 06/15/2015 0951   ALKPHOS 60 06/13/2015 1849   AST 26 06/15/2015 0951   AST 25 06/13/2015 1849   ALT 19 06/15/2015 0951   ALT 20 06/13/2015 1849   BILITOT 0.52 06/15/2015 0951   BILITOT 0.6 06/13/2015 1849      ASSESSMENT & PLAN:  Tonsillar cancer He tolerated treatment well and part from mild intermittent treatment related nausea and vomiting and pancytopenia. I recommend we continue the same treatment but modify to 2 weeks and 1 week off for another 3 months and repeat another PET CT scan. Last PET scan from January 2017 showed positive response to treatment.  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 The patient denies recent history of bleeding such as epistaxis, hematuria or hematochezia. He is asymptomatic from the anemia. We will observe for now.    Chronic kidney disease (CKD), stage II (mild) His kidney function is stable. As long as his creatinine clearance remain greater than 30, we will continue chemotherapy.   Nausea & vomiting I recommend he uses Zofran 2 days after chemotherapy and if it gets worse, he can add Compazine as needed. I encouraged him to increase oral hydration.      Insomnia He had recent insomnia. Recommend a trial of trazodone.  Cancer associated pain He has poorly controlled pain with Percocet. I recommend increasing his pain medication to IR morphine to be taking along with Tylenol for pain control.  he felt that IR morphine caused too much nausea and I switched him to oxycodone with good success. I refilled his pain medications   No orders of the defined types were placed in this encounter.   All questions were answered. The patient knows to call the clinic with any problems, questions or concerns. No barriers to learning was detected. I spent 25 minutes counseling the patient face to face. The total time spent in the  appointment was 30 minutes and more than 50% was on counseling and review of test results     [facility], Mason Ambrocio, MD 06/15/2015 4:19 PM

## 2015-06-15 NOTE — Assessment & Plan Note (Signed)
He had recent insomnia. Recommend a trial of trazodone.

## 2015-06-17 ENCOUNTER — Telehealth: Payer: Self-pay | Admitting: *Deleted

## 2015-06-17 NOTE — Telephone Encounter (Signed)
Per staff message and POF I have scheduled appts. Advised scheduler of appts. JMW  

## 2015-06-20 NOTE — Progress Notes (Signed)
  Oncology Nurse Navigator Documentation  Navigator Location: [facility]-Med Onc (06/15/15 1000) Navigator Encounter Type: Clinic/MDC (06/15/15 1000)           Patient Visit Type: MedOnc (06/15/15 1000)                    Acuity: Level 1 (06/15/15 1000)       Met with Mason [name] during Established Patient appt with Dr. Alvy Bimler.  He reported:  Onset of N&V last Thursday, currently nausea only.  Stated unrelated to chemotherapy but rather "a bug".  Went to [facility] ED on Saturday, received Rx for omeprazole and Zofran ODT.  He has not taken the meds bc he wanted to confirm with Dr. Lurlean Leyden.    Unable to keep down food until recently when able to eat some mashed potatoes and peas.  Not sleeping well bc worried about prognosis.  He is being supported by his pastor, church family, psychologist.  He voiced understanding of Dr. Calton Dach guidance to take meds prescribed by ED MD as prescribed; that he should take Zofran q8h for 3 days following future chemo infusions. He understands I can be contacted with needs/concerns.  Gayleen Orem, RN, BSN, [facility] [phone]       Time Spent with Patient: 45 (06/15/15 1000)

## 2015-06-22 ENCOUNTER — Ambulatory Visit (HOSPITAL_BASED_OUTPATIENT_CLINIC_OR_DEPARTMENT_OTHER): Payer: Medicaid Other

## 2015-06-22 ENCOUNTER — Ambulatory Visit: Payer: Medicaid Other

## 2015-06-22 ENCOUNTER — Other Ambulatory Visit (HOSPITAL_BASED_OUTPATIENT_CLINIC_OR_DEPARTMENT_OTHER): Payer: Medicaid Other

## 2015-06-22 VITALS — BP 129/87 | HR 67 | Temp 97.9°F | Resp 18

## 2015-06-22 DIAGNOSIS — Z5111 Encounter for antineoplastic chemotherapy: Secondary | ICD-10-CM | POA: Diagnosis present

## 2015-06-22 DIAGNOSIS — C099 Malignant neoplasm of tonsil, unspecified: Secondary | ICD-10-CM

## 2015-06-22 LAB — CBC WITH DIFFERENTIAL/PLATELET
BASO%: 0.4 % (ref 0.0–2.0)
Basophils Absolute: 0 10*3/uL (ref 0.0–0.1)
EOS ABS: 0.1 10*3/uL (ref 0.0–0.5)
EOS%: 2.1 % (ref 0.0–7.0)
HEMATOCRIT: 32.3 % — AB (ref 38.4–49.9)
HGB: 10.5 g/dL — ABNORMAL LOW (ref 13.0–17.1)
LYMPH#: 0.7 10*3/uL — AB (ref 0.9–3.3)
LYMPH%: 14.1 % (ref 14.0–49.0)
MCH: 31.8 pg (ref 27.2–33.4)
MCHC: 32.5 g/dL (ref 32.0–36.0)
MCV: 97.7 fL (ref 79.3–98.0)
MONO#: 0.3 10*3/uL (ref 0.1–0.9)
MONO%: 6.3 % (ref 0.0–14.0)
NEUT%: 77.1 % — AB (ref 39.0–75.0)
NEUTROS ABS: 3.7 10*3/uL (ref 1.5–6.5)
PLATELETS: 186 10*3/uL (ref 140–400)
RBC: 3.31 10*6/uL — AB (ref 4.20–5.82)
RDW: 17.5 % — ABNORMAL HIGH (ref 11.0–14.6)
WBC: 4.8 10*3/uL (ref 4.0–10.3)

## 2015-06-22 LAB — COMPREHENSIVE METABOLIC PANEL
ALK PHOS: 60 U/L (ref 40–150)
ALT: 20 U/L (ref 0–55)
ANION GAP: 9 meq/L (ref 3–11)
AST: 22 U/L (ref 5–34)
Albumin: 3.7 g/dL (ref 3.5–5.0)
BUN: 14.9 mg/dL (ref 7.0–26.0)
CALCIUM: 8.7 mg/dL (ref 8.4–10.4)
CO2: 25 meq/L (ref 22–29)
CREATININE: 1.4 mg/dL — AB (ref 0.7–1.3)
Chloride: 106 mEq/L (ref 98–109)
EGFR: 71 mL/min/{1.73_m2} — AB (ref >90)
Glucose: 86 mg/dl (ref 70–140)
Potassium: 4.6 mEq/L (ref 3.5–5.1)
Sodium: 140 mEq/L (ref 136–145)
TOTAL PROTEIN: 6.7 g/dL (ref 6.4–8.3)

## 2015-06-22 MED ORDER — CARBOPLATIN CHEMO INJECTION 450 MG/45ML
242.0000 mg | Freq: Once | INTRAVENOUS | Status: AC
  Administered 2015-06-22: 240 mg via INTRAVENOUS
  Filled 2015-06-22: qty 24

## 2015-06-22 MED ORDER — SODIUM CHLORIDE 0.9% FLUSH
10.0000 mL | INTRAVENOUS | Status: DC | PRN
  Administered 2015-06-22: 10 mL via INTRAVENOUS
  Filled 2015-06-22: qty 10

## 2015-06-22 MED ORDER — FAMOTIDINE IN NACL 20-0.9 MG/50ML-% IV SOLN
INTRAVENOUS | Status: AC
  Filled 2015-06-22: qty 50

## 2015-06-22 MED ORDER — PALONOSETRON HCL INJECTION 0.25 MG/5ML
INTRAVENOUS | Status: AC
  Filled 2015-06-22: qty 5

## 2015-06-22 MED ORDER — SODIUM CHLORIDE 0.9 % IV SOLN
10.0000 mg | Freq: Once | INTRAVENOUS | Status: AC
  Administered 2015-06-22: 10 mg via INTRAVENOUS
  Filled 2015-06-22: qty 1

## 2015-06-22 MED ORDER — HEPARIN SOD (PORK) LOCK FLUSH 100 UNIT/ML IV SOLN
500.0000 [IU] | Freq: Once | INTRAVENOUS | Status: AC | PRN
  Administered 2015-06-22: 500 [IU]
  Filled 2015-06-22: qty 5

## 2015-06-22 MED ORDER — FAMOTIDINE IN NACL 20-0.9 MG/50ML-% IV SOLN
20.0000 mg | Freq: Once | INTRAVENOUS | Status: AC
  Administered 2015-06-22: 20 mg via INTRAVENOUS

## 2015-06-22 MED ORDER — PALONOSETRON HCL INJECTION 0.25 MG/5ML
0.2500 mg | Freq: Once | INTRAVENOUS | Status: AC
  Administered 2015-06-22: 0.25 mg via INTRAVENOUS

## 2015-06-22 MED ORDER — DIPHENHYDRAMINE HCL 50 MG/ML IJ SOLN
INTRAMUSCULAR | Status: AC
  Filled 2015-06-22: qty 1

## 2015-06-22 MED ORDER — SODIUM CHLORIDE 0.9 % IJ SOLN
10.0000 mL | INTRAMUSCULAR | Status: DC | PRN
  Administered 2015-06-22: 10 mL
  Filled 2015-06-22: qty 10

## 2015-06-22 MED ORDER — SODIUM CHLORIDE 0.9 % IV SOLN
Freq: Once | INTRAVENOUS | Status: AC
  Administered 2015-06-22: 13:00:00 via INTRAVENOUS

## 2015-06-22 MED ORDER — DIPHENHYDRAMINE HCL 50 MG/ML IJ SOLN
50.0000 mg | Freq: Once | INTRAMUSCULAR | Status: AC
  Administered 2015-06-22: 50 mg via INTRAVENOUS

## 2015-06-22 MED ORDER — PACLITAXEL CHEMO INJECTION 300 MG/50ML
45.0000 mg/m2 | Freq: Once | INTRAVENOUS | Status: AC
  Administered 2015-06-22: 108 mg via INTRAVENOUS
  Filled 2015-06-22: qty 18

## 2015-06-22 NOTE — Patient Instructions (Signed)
[facility] Discharge Instructions for Patients Receiving Chemotherapy  Today you received the following chemotherapy agents Taxol and Carboplatin. To help prevent nausea and vomiting after your treatment, we encourage you to take your nausea medication as directed.  If you develop nausea and vomiting that is not controlled by your nausea medication, call the clinic.   BELOW ARE SYMPTOMS THAT SHOULD BE REPORTED IMMEDIATELY:  *FEVER GREATER THAN 100.5 F  *CHILLS WITH OR WITHOUT FEVER  NAUSEA AND VOMITING THAT IS NOT CONTROLLED WITH YOUR NAUSEA MEDICATION  *UNUSUAL SHORTNESS OF BREATH  *UNUSUAL BRUISING OR BLEEDING  TENDERNESS IN MOUTH AND THROAT WITH OR WITHOUT PRESENCE OF ULCERS  *URINARY PROBLEMS  *BOWEL PROBLEMS  UNUSUAL RASH Items with * indicate a potential emergency and should be followed up as soon as possible.  Feel free to call the clinic you have any questions or concerns. The clinic phone number is [phone].  Please show the CHEMO ALERT CARD at check-in to the Emergency Department and triage nurse.    

## 2015-07-06 ENCOUNTER — Other Ambulatory Visit (HOSPITAL_BASED_OUTPATIENT_CLINIC_OR_DEPARTMENT_OTHER): Payer: Medicaid Other

## 2015-07-06 ENCOUNTER — Telehealth: Payer: Self-pay | Admitting: *Deleted

## 2015-07-06 ENCOUNTER — Ambulatory Visit: Payer: Medicaid Other

## 2015-07-06 ENCOUNTER — Encounter: Payer: Self-pay | Admitting: Hematology and Oncology

## 2015-07-06 ENCOUNTER — Ambulatory Visit (HOSPITAL_BASED_OUTPATIENT_CLINIC_OR_DEPARTMENT_OTHER): Payer: Medicaid Other

## 2015-07-06 ENCOUNTER — Ambulatory Visit (HOSPITAL_BASED_OUTPATIENT_CLINIC_OR_DEPARTMENT_OTHER): Payer: Medicaid Other | Admitting: Hematology and Oncology

## 2015-07-06 ENCOUNTER — Telehealth: Payer: Self-pay | Admitting: Hematology and Oncology

## 2015-07-06 ENCOUNTER — Other Ambulatory Visit: Payer: Self-pay

## 2015-07-06 VITALS — BP 133/79 | HR 79 | Temp 98.2°F | Resp 18 | Ht 78.0 in | Wt 247.2 lb

## 2015-07-06 DIAGNOSIS — Z95828 Presence of other vascular implants and grafts: Secondary | ICD-10-CM

## 2015-07-06 DIAGNOSIS — N182 Chronic kidney disease, stage 2 (mild): Secondary | ICD-10-CM

## 2015-07-06 DIAGNOSIS — C099 Malignant neoplasm of tonsil, unspecified: Secondary | ICD-10-CM

## 2015-07-06 DIAGNOSIS — C182 Malignant neoplasm of ascending colon: Secondary | ICD-10-CM

## 2015-07-06 DIAGNOSIS — T451X5A Adverse effect of antineoplastic and immunosuppressive drugs, initial encounter: Secondary | ICD-10-CM

## 2015-07-06 DIAGNOSIS — D630 Anemia in neoplastic disease: Secondary | ICD-10-CM

## 2015-07-06 DIAGNOSIS — G893 Neoplasm related pain (acute) (chronic): Secondary | ICD-10-CM | POA: Diagnosis not present

## 2015-07-06 DIAGNOSIS — G4700 Insomnia, unspecified: Secondary | ICD-10-CM | POA: Diagnosis not present

## 2015-07-06 DIAGNOSIS — Z5111 Encounter for antineoplastic chemotherapy: Secondary | ICD-10-CM

## 2015-07-06 DIAGNOSIS — G620 Drug-induced polyneuropathy: Secondary | ICD-10-CM | POA: Diagnosis not present

## 2015-07-06 LAB — CBC WITH DIFFERENTIAL/PLATELET
BASO%: 0.3 % (ref 0.0–2.0)
BASOS ABS: 0 10*3/uL (ref 0.0–0.1)
EOS%: 1.2 % (ref 0.0–7.0)
Eosinophils Absolute: 0.1 10*3/uL (ref 0.0–0.5)
HEMATOCRIT: 34.3 % — AB (ref 38.4–49.9)
HGB: 11.3 g/dL — ABNORMAL LOW (ref 13.0–17.1)
LYMPH#: 0.6 10*3/uL — AB (ref 0.9–3.3)
LYMPH%: 14.6 % (ref 14.0–49.0)
MCH: 32.9 pg (ref 27.2–33.4)
MCHC: 32.9 g/dL (ref 32.0–36.0)
MCV: 99.9 fL — AB (ref 79.3–98.0)
MONO#: 0.3 10*3/uL (ref 0.1–0.9)
MONO%: 8.1 % (ref 0.0–14.0)
NEUT#: 3.2 10*3/uL (ref 1.5–6.5)
NEUT%: 75.8 % — AB (ref 39.0–75.0)
PLATELETS: 201 10*3/uL (ref 140–400)
RBC: 3.43 10*6/uL — ABNORMAL LOW (ref 4.20–5.82)
RDW: 17.1 % — ABNORMAL HIGH (ref 11.0–14.6)
WBC: 4.2 10*3/uL (ref 4.0–10.3)

## 2015-07-06 LAB — COMPREHENSIVE METABOLIC PANEL
ALT: 17 U/L (ref 0–55)
ANION GAP: 8 meq/L (ref 3–11)
AST: 23 U/L (ref 5–34)
Albumin: 4.1 g/dL (ref 3.5–5.0)
Alkaline Phosphatase: 61 U/L (ref 40–150)
BUN: 17.3 mg/dL (ref 7.0–26.0)
CALCIUM: 9.6 mg/dL (ref 8.4–10.4)
CHLORIDE: 104 meq/L (ref 98–109)
CO2: 28 mEq/L (ref 22–29)
CREATININE: 1.7 mg/dL — AB (ref 0.7–1.3)
EGFR: 53 mL/min/{1.73_m2} — AB (ref >90)
Glucose: 97 mg/dl (ref 70–140)
POTASSIUM: 4.5 meq/L (ref 3.5–5.1)
Sodium: 140 mEq/L (ref 136–145)
Total Bilirubin: 0.49 mg/dL (ref 0.20–1.20)
Total Protein: 7.4 g/dL (ref 6.4–8.3)

## 2015-07-06 MED ORDER — SODIUM CHLORIDE 0.9 % IJ SOLN
10.0000 mL | INTRAMUSCULAR | Status: DC | PRN
  Administered 2015-07-06: 10 mL
  Filled 2015-07-06: qty 10

## 2015-07-06 MED ORDER — PALONOSETRON HCL INJECTION 0.25 MG/5ML
0.2500 mg | Freq: Once | INTRAVENOUS | Status: AC
  Administered 2015-07-06: 0.25 mg via INTRAVENOUS

## 2015-07-06 MED ORDER — SODIUM CHLORIDE 0.9% FLUSH
10.0000 mL | INTRAVENOUS | Status: DC | PRN
  Administered 2015-07-06: 10 mL via INTRAVENOUS
  Filled 2015-07-06: qty 10

## 2015-07-06 MED ORDER — PACLITAXEL CHEMO INJECTION 300 MG/50ML
45.0000 mg/m2 | Freq: Once | INTRAVENOUS | Status: AC
  Administered 2015-07-06: 108 mg via INTRAVENOUS
  Filled 2015-07-06: qty 18

## 2015-07-06 MED ORDER — SODIUM CHLORIDE 0.9 % IV SOLN
208.0000 mg | Freq: Once | INTRAVENOUS | Status: AC
  Administered 2015-07-06: 210 mg via INTRAVENOUS
  Filled 2015-07-06: qty 21

## 2015-07-06 MED ORDER — SODIUM CHLORIDE 0.9 % IV SOLN
Freq: Once | INTRAVENOUS | Status: AC
  Administered 2015-07-06: 13:00:00 via INTRAVENOUS

## 2015-07-06 MED ORDER — PROCHLORPERAZINE MALEATE 10 MG PO TABS: 10.0000 mg | ORAL_TABLET | Freq: Four times a day (QID) | ORAL | Status: DC | PRN

## 2015-07-06 MED ORDER — TRAZODONE HCL 100 MG PO TABS: 100.0000 mg | ORAL_TABLET | Freq: Every day | ORAL | Status: DC

## 2015-07-06 MED ORDER — FAMOTIDINE IN NACL 20-0.9 MG/50ML-% IV SOLN
INTRAVENOUS | Status: AC
  Filled 2015-07-06: qty 50

## 2015-07-06 MED ORDER — FAMOTIDINE IN NACL 20-0.9 MG/50ML-% IV SOLN
20.0000 mg | Freq: Once | INTRAVENOUS | Status: AC
  Administered 2015-07-06: 20 mg via INTRAVENOUS

## 2015-07-06 MED ORDER — HEPARIN SOD (PORK) LOCK FLUSH 100 UNIT/ML IV SOLN
500.0000 [IU] | Freq: Once | INTRAVENOUS | Status: AC | PRN
  Administered 2015-07-06: 500 [IU]
  Filled 2015-07-06: qty 5

## 2015-07-06 MED ORDER — SODIUM CHLORIDE 0.9 % IV SOLN
10.0000 mg | Freq: Once | INTRAVENOUS | Status: AC
  Administered 2015-07-06: 10 mg via INTRAVENOUS
  Filled 2015-07-06: qty 1

## 2015-07-06 MED ORDER — PALONOSETRON HCL INJECTION 0.25 MG/5ML
INTRAVENOUS | Status: AC
  Filled 2015-07-06: qty 5

## 2015-07-06 MED ORDER — OXYCODONE HCL 10 MG PO TABS: 10.0000 mg | ORAL_TABLET | Freq: Four times a day (QID) | ORAL | Status: DC | PRN

## 2015-07-06 MED ORDER — DIPHENHYDRAMINE HCL 50 MG/ML IJ SOLN
50.0000 mg | Freq: Once | INTRAMUSCULAR | Status: AC
  Administered 2015-07-06: 50 mg via INTRAVENOUS

## 2015-07-06 MED ORDER — DIPHENHYDRAMINE HCL 50 MG/ML IJ SOLN
INTRAMUSCULAR | Status: AC
  Filled 2015-07-06: qty 1

## 2015-07-06 MED FILL — oxyCODONE HCL 10 MG TABS: 10 | 22 days supply | Qty: 90 | Fill #0

## 2015-07-06 NOTE — Assessment & Plan Note (Signed)
He has significant neuropathy from prior chemotherapy, currently stable and not worse He is on reduced dose chemotherapy and I will continue same dose treatment without further dose adjustment

## 2015-07-06 NOTE — Assessment & Plan Note (Signed)
His kidney function is stable. As long as his creatinine clearance remain greater than 30, we will continue chemotherapy. 

## 2015-07-06 NOTE — Progress Notes (Signed)
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 FNA of right level III lymph node was inconclusive for cancer   07/25/2013 Imaging PET scan showed locally advanced disease with abnormal lymphadenopathy in the right axilla   08/06/2013 Procedure CT-guided biopsy of the lymphadenopathy was negative for malignancy   08/15/2013 Surgery Patient has placement of port and feeding tube   08/19/2013 - 09/10/2013 Chemotherapy Patient received chemotherapy with cisplatin. The patient only received 2 doses due to uncontrolled nausea and acute renal failure.   08/19/2013 - 10/15/2013 Radiation Therapy Received Helical IMRT Tomotherapy:  Right Tonstil and bilateral neck / 70 Gy in 35 fractions to gross disease, 63 Gy in 35 fractions to high risk nodal echelons, and 56 Gy in 35 fractions to intermediate risk nodal echelons.   08/27/2013 - 08/30/2013 Hospital Admission The patient was admitted to the hospital for uncontrolled nausea vomiting and dehydration.   02/14/2014 Imaging PET/CT scan showed complete response to treatment   03/19/2014 Surgery He had excisional lymph node biopsy from the right neck. Pathology was benign   05/13/2014 Surgery He had removal of Port-A-Cath.    05/22/2014 Imaging Repeat CT scan of the neck show no evidence of disease recurrence.   12/09/2014 Imaging Ct neck without contrast showed persistent abnormalities on the right side of neck, indeterminate   12/25/2014 Imaging PET CT scan showed disease recurrence.   02/10/2015 Procedure He has placement of port   02/13/2015 -  Chemotherapy He received chemotherapy with carbo/Taxol   04/21/2015 Imaging PET CT scan showed improved disease control    INTERVAL HISTORY: Please see below for problem oriented charting. He is seen prior to chemotherapy. His prior nausea and vomiting is less when I modified his premedications.  He continues to have pain in the right side of the neck, well controlled on oxycodone. Denies constipation. No worsening neuropathy. No recent infection.  REVIEW OF SYSTEMS:   Constitutional: Denies fevers, chills or abnormal weight loss Eyes: Denies blurriness of vision Ears, nose, mouth, throat, and face: Denies mucositis or sore throat Respiratory: Denies cough, dyspnea or wheezes Cardiovascular: Denies palpitation, chest discomfort or lower extremity swelling Gastrointestinal:  Denies nausea, heartburn or change in bowel habits Skin: Denies abnormal skin rashes Lymphatics: Denies new lymphadenopathy or easy bruising Neurological:Denies numbness, tingling or new weaknesses Behavioral/Psych: Mood is stable, no new changes  All other systems were reviewed with the patient and are negative.  I have reviewed the past medical history, past surgical history, social history and family history with the patient and they are unchanged from previous note.  ALLERGIES:  is allergic to pollen extract.  MEDICATIONS:  Current Outpatient Prescriptions  Medication Sig Dispense Refill  . acetaminophen (TYLENOL) 325 MG tablet Take 325-650 mg by mouth every 6 (six)  hours as needed (pain).    . fluticasone (FLONASE) 50 MCG/ACT nasal spray Place 2 sprays into both nostrils daily. (Patient  taking differently: Place 2 sprays into both nostrils daily as needed for allergies. ) 16 g 2  . lidocaine-prilocaine (EMLA) cream Apply 1 application topically as needed. 30 g 6  . Multiple Vitamin (MULTIVITAMIN WITH MINERALS) TABS tablet Take 1 tablet by mouth daily.    Marland Kitchen omeprazole (PRILOSEC) 20 MG capsule Take 1 capsule (20 mg total) by mouth daily. 30 capsule 0  . ondansetron (ZOFRAN ODT) 4 MG disintegrating tablet Take 1 tablet (4 mg total) by mouth every 8 (eight) hours as needed for nausea or vomiting. 10 tablet 0  . ondansetron (ZOFRAN) 8 MG tablet Take 1 tablet (8 mg total) by mouth every 8 (eight) hours as needed for nausea or vomiting. 90 tablet 1  . Oxycodone HCl 10 MG TABS Take 1 tablet (10 mg total) by mouth every 6 (six) hours as needed. 90 tablet 0  . prochlorperazine (COMPAZINE) 10 MG tablet Take 1 tablet (10 mg total) by mouth every 6 (six) hours as needed (Nausea or vomiting). 60 tablet 1  . traZODone (DESYREL) 100 MG tablet Take 1 tablet (100 mg total) by mouth at bedtime. 30 tablet 6   No current facility-administered medications for this visit.   Facility-Administered Medications Ordered in Other Visits  Medication Dose Route Frequency Provider Last Rate Last Dose  . CARBOplatin (PARAPLATIN) 210 mg in sodium chloride 0.9 % 100 mL chemo infusion  210 mg Intravenous Once [name], MD 242 mL/hr at 07/06/15 1426 210 mg at 07/06/15 1426  . heparin lock flush 100 unit/mL  500 Units Intracatheter Once PRN [name], MD      . sodium chloride 0.9 % injection 10 mL  10 mL Intracatheter PRN [name], MD        PHYSICAL EXAMINATION: ECOG PERFORMANCE STATUS: 1 - Symptomatic but completely ambulatory  Filed Vitals:   07/06/15 1206  BP: 133/79  Pulse: 79  Temp: 98.2 F (36.8 C)  Resp: 18   Filed Weights   07/06/15 1206  Weight: 247 lb 3.2 oz (112.129 kg)    GENERAL:alert, no distress and comfortable SKIN: skin color, texture, turgor are normal, no rashes or  significant lesions EYES: normal, Conjunctiva are pink and non-injected, sclera clear OROPHARYNX:no exudate, no erythema and lips, buccal mucosa, and tongue normal  NECK: Well-healed surgical scar with no other abnormalities  LYMPH:  no palpable lymphadenopathy in the cervical, axillary or inguinal LUNGS: clear to auscultation and percussion with normal breathing effort HEART: regular rate & rhythm and no murmurs and no lower extremity edema ABDOMEN:abdomen soft, non-tender and normal bowel sounds Musculoskeletal:no cyanosis of digits and no clubbing  NEURO: alert & oriented x 3 with fluent speech, no focal motor/sensory deficits  LABORATORY DATA:  I have reviewed the data as listed    Component Value Date/Time   NA 140 07/06/2015 1148   NA 141 06/13/2015 1849   K 4.5 07/06/2015 1148   K 4.1 06/13/2015 1849   CL 104 06/13/2015 1849   CO2 28 07/06/2015 1148   CO2 29 06/13/2015 1849   GLUCOSE 97 07/06/2015 1148   GLUCOSE 89 06/13/2015 1849   BUN 17.3 07/06/2015 1148   BUN 20 06/13/2015 1849   CREATININE 1.7* 07/06/2015 1148   CREATININE 1.56* 06/13/2015 1849   CALCIUM 9.6 07/06/2015 1148   CALCIUM 9.3 06/13/2015 1849   PROT 7.4 07/06/2015 1148  PROT 7.8 06/13/2015 1849   ALBUMIN 4.1 07/06/2015 1148   ALBUMIN 4.4 06/13/2015 1849   AST 23 07/06/2015 1148   AST 25 06/13/2015 1849   ALT 17 07/06/2015 1148   ALT 20 06/13/2015 1849   ALKPHOS 61 07/06/2015 1148   ALKPHOS 60 06/13/2015 1849   BILITOT 0.49 07/06/2015 1148   BILITOT 0.6 06/13/2015 1849   GFRNONAA 51* 06/13/2015 1849   GFRAA 59* 06/13/2015 1849    No results found for: SPEP, UPEP  Lab Results  Component Value Date   WBC 4.2 07/06/2015   NEUTROABS 3.2 07/06/2015   HGB 11.3* 07/06/2015   HCT 34.3* 07/06/2015   MCV 99.9* 07/06/2015   PLT 201 07/06/2015      Chemistry      Component Value Date/Time   NA 140 07/06/2015 1148   NA 141 06/13/2015 1849   K 4.5 07/06/2015 1148   K 4.1 06/13/2015 1849   CL  104 06/13/2015 1849   CO2 28 07/06/2015 1148   CO2 29 06/13/2015 1849   BUN 17.3 07/06/2015 1148   BUN 20 06/13/2015 1849   CREATININE 1.7* 07/06/2015 1148   CREATININE 1.56* 06/13/2015 1849      Component Value Date/Time   CALCIUM 9.6 07/06/2015 1148   CALCIUM 9.3 06/13/2015 1849   ALKPHOS 61 07/06/2015 1148   ALKPHOS 60 06/13/2015 1849   AST 23 07/06/2015 1148   AST 25 06/13/2015 1849   ALT 17 07/06/2015 1148   ALT 20 06/13/2015 1849   BILITOT 0.49 07/06/2015 1148   BILITOT 0.6 06/13/2015 1849      ASSESSMENT & PLAN:  Tonsillar cancer He tolerated treatment well and part from mild intermittent treatment related nausea and vomiting and pancytopenia. I recommend we continue the same treatment but modify to 2 weeks and 1 week off and repeat another PET CT scan next month  Anemia in neoplastic disease This is likely anemia of chronic disease. The patient denies recent history of bleeding such as epistaxis, hematuria or hematochezia. He is asymptomatic from the anemia. We will observe for now.    Chronic kidney disease (CKD), stage II (mild) His kidney function is stable. As long as his creatinine clearance remain greater than 30, we will continue chemotherapy.   Insomnia He felt trazodone was helpful but not strong enough. I plan to increase the dose of trazodone to 100 mg daily at bedtime  Cancer associated pain He has poorly controlled pain with Percocet. I recommend increasing his pain medication to IR morphine to be taking along with Tylenol for pain control.  he felt that IR morphine caused too much nausea and I switched him to oxycodone with good success. 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 NM PET Image Restag (PS) Skull Base To Thigh    Standing Status: Future     Number of Occurrences:      Standing Expiration Date: 09/04/2016    Order Specific Question:  Reason for Exam (SYMPTOM  OR DIAGNOSIS REQUIRED)    Answer:  tonsil cancer, assess response to chemo    Order Specific Question:  Preferred imaging location?    Answer:  [facility]   All questions were answered. The patient knows to call  the clinic with any problems, questions or concerns. No barriers to learning was detected. I spent 30 minutes counseling the patient face to face. The total time spent in the appointment was 40 minutes and more than 50% was on counseling and review of test results     [facility], [place], MD 07/06/2015 2:27 PM

## 2015-07-06 NOTE — Assessment & Plan Note (Signed)
He tolerated treatment well and part from mild intermittent treatment related nausea and vomiting and pancytopenia. I recommend we continue the same treatment but modify to 2 weeks and 1 week off and repeat another PET CT scan next month

## 2015-07-06 NOTE — Assessment & Plan Note (Signed)
This is likely anemia of chronic disease. The patient denies recent history of bleeding such as epistaxis, hematuria or hematochezia. He is asymptomatic from the anemia. We will observe for now.  

## 2015-07-06 NOTE — Assessment & Plan Note (Signed)
He has poorly controlled pain with Percocet. I recommend increasing his pain medication to IR morphine to be taking along with Tylenol for pain control.  he felt that IR morphine caused too much nausea and I switched him to oxycodone with good success. I refilled his pain medications

## 2015-07-06 NOTE — Assessment & Plan Note (Signed)
He felt trazodone was helpful but not strong enough. I plan to increase the dose of trazodone to 100 mg daily at bedtime

## 2015-07-06 NOTE — Telephone Encounter (Signed)
Per staff message and POF I have scheduled appts. Advised scheduler of appts. JMW  

## 2015-07-06 NOTE — Patient Instructions (Signed)

## 2015-07-06 NOTE — Telephone Encounter (Signed)
per po to sch pt appt-sent MW email to sch trmt-pt to get updated copy b4 leaving trmt

## 2015-07-13 ENCOUNTER — Other Ambulatory Visit (HOSPITAL_BASED_OUTPATIENT_CLINIC_OR_DEPARTMENT_OTHER): Payer: Medicaid Other

## 2015-07-13 ENCOUNTER — Ambulatory Visit: Payer: Medicaid Other

## 2015-07-13 ENCOUNTER — Ambulatory Visit: Payer: Medicaid Other | Admitting: Nutrition

## 2015-07-13 ENCOUNTER — Ambulatory Visit (HOSPITAL_BASED_OUTPATIENT_CLINIC_OR_DEPARTMENT_OTHER): Payer: Medicaid Other

## 2015-07-13 VITALS — BP 149/76 | HR 93 | Temp 98.2°F | Resp 18

## 2015-07-13 DIAGNOSIS — Z5111 Encounter for antineoplastic chemotherapy: Secondary | ICD-10-CM

## 2015-07-13 DIAGNOSIS — C099 Malignant neoplasm of tonsil, unspecified: Secondary | ICD-10-CM

## 2015-07-13 DIAGNOSIS — Z95828 Presence of other vascular implants and grafts: Secondary | ICD-10-CM

## 2015-07-13 LAB — CBC WITH DIFFERENTIAL/PLATELET
BASO%: 0.5 % (ref 0.0–2.0)
Basophils Absolute: 0 10*3/uL (ref 0.0–0.1)
EOS%: 0.4 % (ref 0.0–7.0)
Eosinophils Absolute: 0 10*3/uL (ref 0.0–0.5)
HCT: 32.8 % — ABNORMAL LOW (ref 38.4–49.9)
HEMOGLOBIN: 10.8 g/dL — AB (ref 13.0–17.1)
LYMPH%: 16.8 % (ref 14.0–49.0)
MCH: 33 pg (ref 27.2–33.4)
MCHC: 33 g/dL (ref 32.0–36.0)
MCV: 100 fL — ABNORMAL HIGH (ref 79.3–98.0)
MONO#: 0.3 10*3/uL (ref 0.1–0.9)
MONO%: 8.8 % (ref 0.0–14.0)
NEUT%: 73.5 % (ref 39.0–75.0)
NEUTROS ABS: 2.9 10*3/uL (ref 1.5–6.5)
Platelets: 260 10*3/uL (ref 140–400)
RBC: 3.28 10*6/uL — AB (ref 4.20–5.82)
RDW: 16.6 % — AB (ref 11.0–14.6)
WBC: 3.9 10*3/uL — AB (ref 4.0–10.3)
lymph#: 0.7 10*3/uL — ABNORMAL LOW (ref 0.9–3.3)

## 2015-07-13 LAB — COMPREHENSIVE METABOLIC PANEL
ALT: 17 U/L (ref 0–55)
AST: 26 U/L (ref 5–34)
Albumin: 4 g/dL (ref 3.5–5.0)
Alkaline Phosphatase: 58 U/L (ref 40–150)
Anion Gap: 7 mEq/L (ref 3–11)
BILIRUBIN TOTAL: 0.44 mg/dL (ref 0.20–1.20)
BUN: 22.9 mg/dL (ref 7.0–26.0)
CO2: 27 meq/L (ref 22–29)
CREATININE: 1.5 mg/dL — AB (ref 0.7–1.3)
Calcium: 9.3 mg/dL (ref 8.4–10.4)
Chloride: 105 mEq/L (ref 98–109)
EGFR: 62 mL/min/{1.73_m2} — AB (ref >90)
GLUCOSE: 84 mg/dL (ref 70–140)
Potassium: 4.5 mEq/L (ref 3.5–5.1)
SODIUM: 140 meq/L (ref 136–145)
TOTAL PROTEIN: 7.2 g/dL (ref 6.4–8.3)

## 2015-07-13 MED ORDER — CARBOPLATIN CHEMO INTRADERMAL TEST DOSE 100MCG/0.02ML
100.0000 ug | Freq: Once | INTRADERMAL | Status: AC
  Administered 2015-07-13: 100 ug via INTRADERMAL
  Filled 2015-07-13: qty 0.01

## 2015-07-13 MED ORDER — SODIUM CHLORIDE 0.9 % IJ SOLN
10.0000 mL | INTRAMUSCULAR | Status: DC | PRN
  Administered 2015-07-13: 10 mL
  Filled 2015-07-13: qty 10

## 2015-07-13 MED ORDER — DIPHENHYDRAMINE HCL 50 MG/ML IJ SOLN
50.0000 mg | Freq: Once | INTRAMUSCULAR | Status: AC
  Administered 2015-07-13: 50 mg via INTRAVENOUS

## 2015-07-13 MED ORDER — PALONOSETRON HCL INJECTION 0.25 MG/5ML
INTRAVENOUS | Status: AC
  Filled 2015-07-13: qty 5

## 2015-07-13 MED ORDER — PALONOSETRON HCL INJECTION 0.25 MG/5ML
0.2500 mg | Freq: Once | INTRAVENOUS | Status: AC
  Administered 2015-07-13: 0.25 mg via INTRAVENOUS

## 2015-07-13 MED ORDER — SODIUM CHLORIDE 0.9 % IV SOLN
229.2000 mg | Freq: Once | INTRAVENOUS | Status: AC
  Administered 2015-07-13: 230 mg via INTRAVENOUS
  Filled 2015-07-13: qty 23

## 2015-07-13 MED ORDER — SODIUM CHLORIDE 0.9% FLUSH
10.0000 mL | INTRAVENOUS | Status: DC | PRN
  Administered 2015-07-13: 10 mL via INTRAVENOUS
  Filled 2015-07-13: qty 10

## 2015-07-13 MED ORDER — DIPHENHYDRAMINE HCL 50 MG/ML IJ SOLN
INTRAMUSCULAR | Status: AC
  Filled 2015-07-13: qty 1

## 2015-07-13 MED ORDER — SODIUM CHLORIDE 0.9 % IV SOLN
Freq: Once | INTRAVENOUS | Status: AC
  Administered 2015-07-13: 13:00:00 via INTRAVENOUS

## 2015-07-13 MED ORDER — HEPARIN SOD (PORK) LOCK FLUSH 100 UNIT/ML IV SOLN
500.0000 [IU] | Freq: Once | INTRAVENOUS | Status: AC | PRN
  Administered 2015-07-13: 500 [IU]
  Filled 2015-07-13: qty 5

## 2015-07-13 MED ORDER — SODIUM CHLORIDE 0.9 % IV SOLN
10.0000 mg | Freq: Once | INTRAVENOUS | Status: AC
  Administered 2015-07-13: 10 mg via INTRAVENOUS
  Filled 2015-07-13: qty 1

## 2015-07-13 MED ORDER — FAMOTIDINE IN NACL 20-0.9 MG/50ML-% IV SOLN
INTRAVENOUS | Status: AC
  Filled 2015-07-13: qty 50

## 2015-07-13 MED ORDER — PACLITAXEL CHEMO INJECTION 300 MG/50ML
45.0000 mg/m2 | Freq: Once | INTRAVENOUS | Status: AC
  Administered 2015-07-13: 108 mg via INTRAVENOUS
  Filled 2015-07-13: qty 18

## 2015-07-13 MED ORDER — FAMOTIDINE IN NACL 20-0.9 MG/50ML-% IV SOLN
20.0000 mg | Freq: Once | INTRAVENOUS | Status: AC
  Administered 2015-07-13: 20 mg via INTRAVENOUS

## 2015-07-13 NOTE — Patient Instructions (Signed)

## 2015-07-13 NOTE — Patient Instructions (Signed)
[facility] Discharge Instructions for Patients Receiving Chemotherapy  Today you received the following chemotherapy agents Paclitaxel/Carboplatin.   To help prevent nausea and vomiting after your treatment, we encourage you to take your nausea medication as directed.    If you develop nausea and vomiting that is not controlled by your nausea medication, call the clinic.   BELOW ARE SYMPTOMS THAT SHOULD BE REPORTED IMMEDIATELY:  *FEVER GREATER THAN 100.5 F  *CHILLS WITH OR WITHOUT FEVER  NAUSEA AND VOMITING THAT IS NOT CONTROLLED WITH YOUR NAUSEA MEDICATION  *UNUSUAL SHORTNESS OF BREATH  *UNUSUAL BRUISING OR BLEEDING  TENDERNESS IN MOUTH AND THROAT WITH OR WITHOUT PRESENCE OF ULCERS  *URINARY PROBLEMS  *BOWEL PROBLEMS  UNUSUAL RASH Items with * indicate a potential emergency and should be followed up as soon as possible.  Feel free to call the clinic you have any questions or concerns. The clinic phone number is [phone].  Please show the CHEMO ALERT CARD at check-in to the Emergency Department and triage nurse.   

## 2015-07-13 NOTE — Progress Notes (Signed)
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 Weight improved documented as 247.2 pounds on March 20. Patient reports decreased appetite after chemotherapy. He has some mild nausea. Patient is requesting additional samples of Ensure Plus or boost plus.  Nutrition diagnosis: Inadequate oral intake improved.  Intervention:  I enforced importance of patient following a bland diet during times of nausea. Provided samples of oral nutrition supplements. Provided active and supportive listening. Teach back method used.  Monitoring, evaluation, goals: Patient will continue to consume adequate calories and protein to minimize weight loss.  Next visit: Will be scheduled as needed.  **Disclaimer: This note was dictated with voice recognition software. Similar sounding words can inadvertently be transcribed and this note may contain transcription errors which may not have been corrected upon publication of note.**

## 2015-07-21 ENCOUNTER — Encounter: Payer: Self-pay | Admitting: Hematology and Oncology

## 2015-07-21 NOTE — Progress Notes (Signed)
Fax sent 02/27/15 I sent to medical records

## 2015-07-23 ENCOUNTER — Emergency Department (HOSPITAL_COMMUNITY)
Admission: EM | Admit: 2015-07-23 | Discharge: 2015-07-23 | Disposition: A | Discharge status: Home / Self Care | Payer: Medicaid Other | Attending: Emergency Medicine | Admitting: Emergency Medicine

## 2015-07-23 ENCOUNTER — Emergency Department (HOSPITAL_COMMUNITY): Payer: Medicaid Other

## 2015-07-23 ENCOUNTER — Encounter (HOSPITAL_COMMUNITY): Payer: Self-pay | Admitting: *Deleted

## 2015-07-23 DIAGNOSIS — Z8589 Personal history of malignant neoplasm of other organs and systems: Secondary | ICD-10-CM | POA: Insufficient documentation

## 2015-07-23 DIAGNOSIS — G8929 Other chronic pain: Secondary | ICD-10-CM | POA: Insufficient documentation

## 2015-07-23 DIAGNOSIS — Z9221 Personal history of antineoplastic chemotherapy: Secondary | ICD-10-CM | POA: Insufficient documentation

## 2015-07-23 DIAGNOSIS — Z87891 Personal history of nicotine dependence: Secondary | ICD-10-CM | POA: Insufficient documentation

## 2015-07-23 DIAGNOSIS — R109 Unspecified abdominal pain: Secondary | ICD-10-CM | POA: Diagnosis not present

## 2015-07-23 DIAGNOSIS — Z8719 Personal history of other diseases of the digestive system: Secondary | ICD-10-CM | POA: Insufficient documentation

## 2015-07-23 DIAGNOSIS — I10 Essential (primary) hypertension: Secondary | ICD-10-CM | POA: Diagnosis not present

## 2015-07-23 DIAGNOSIS — Z87448 Personal history of other diseases of urinary system: Secondary | ICD-10-CM | POA: Insufficient documentation

## 2015-07-23 DIAGNOSIS — F419 Anxiety disorder, unspecified: Secondary | ICD-10-CM | POA: Insufficient documentation

## 2015-07-23 DIAGNOSIS — Z8709 Personal history of other diseases of the respiratory system: Secondary | ICD-10-CM | POA: Diagnosis not present

## 2015-07-23 DIAGNOSIS — Z79899 Other long term (current) drug therapy: Secondary | ICD-10-CM | POA: Insufficient documentation

## 2015-07-23 DIAGNOSIS — Z862 Personal history of diseases of the blood and blood-forming organs and certain disorders involving the immune mechanism: Secondary | ICD-10-CM | POA: Insufficient documentation

## 2015-07-23 DIAGNOSIS — Z923 Personal history of irradiation: Secondary | ICD-10-CM | POA: Diagnosis not present

## 2015-07-23 DIAGNOSIS — Z8739 Personal history of other diseases of the musculoskeletal system and connective tissue: Secondary | ICD-10-CM | POA: Insufficient documentation

## 2015-07-23 DIAGNOSIS — Z87828 Personal history of other (healed) physical injury and trauma: Secondary | ICD-10-CM | POA: Insufficient documentation

## 2015-07-23 DIAGNOSIS — Z7951 Long term (current) use of inhaled steroids: Secondary | ICD-10-CM | POA: Diagnosis not present

## 2015-07-23 DIAGNOSIS — Z8639 Personal history of other endocrine, nutritional and metabolic disease: Secondary | ICD-10-CM | POA: Diagnosis not present

## 2015-07-23 DIAGNOSIS — Z931 Gastrostomy status: Secondary | ICD-10-CM | POA: Insufficient documentation

## 2015-07-23 LAB — URINE MICROSCOPIC-ADD ON

## 2015-07-23 LAB — URINALYSIS, ROUTINE W REFLEX MICROSCOPIC
BILIRUBIN URINE: NEGATIVE
GLUCOSE, UA: NEGATIVE mg/dL
HGB URINE DIPSTICK: NEGATIVE
KETONES UR: NEGATIVE mg/dL
Nitrite: NEGATIVE
PH: 6 (ref 5.0–8.0)
Protein, ur: NEGATIVE mg/dL
Specific Gravity, Urine: 1.02 (ref 1.005–1.030)

## 2015-07-23 LAB — CBC
HCT: 31.3 % — ABNORMAL LOW (ref 39.0–52.0)
Hemoglobin: 10.5 g/dL — ABNORMAL LOW (ref 13.0–17.0)
MCH: 33.8 pg (ref 26.0–34.0)
MCHC: 33.5 g/dL (ref 30.0–36.0)
MCV: 100.6 fL — AB (ref 78.0–100.0)
PLATELETS: 257 10*3/uL (ref 150–400)
RBC: 3.11 MIL/uL — ABNORMAL LOW (ref 4.22–5.81)
RDW: 15.6 % — ABNORMAL HIGH (ref 11.5–15.5)
WBC: 4 10*3/uL (ref 4.0–10.5)

## 2015-07-23 LAB — BASIC METABOLIC PANEL
Anion gap: 6 (ref 5–15)
BUN: 19 mg/dL (ref 6–20)
CO2: 26 mmol/L (ref 22–32)
CREATININE: 1.76 mg/dL — AB (ref 0.61–1.24)
Calcium: 8.9 mg/dL (ref 8.9–10.3)
Chloride: 107 mmol/L (ref 101–111)
GFR calc Af Amer: 51 mL/min — ABNORMAL LOW (ref >60)
GFR, EST NON AFRICAN AMERICAN: 44 mL/min — AB (ref >60)
GLUCOSE: 88 mg/dL (ref 65–99)
Potassium: 4.3 mmol/L (ref 3.5–5.1)
SODIUM: 139 mmol/L (ref 135–145)

## 2015-07-23 NOTE — ED Notes (Signed)
Verbalized understanding discharge instructions and follow-up. In no acute distress.   

## 2015-07-23 NOTE — ED Notes (Signed)
Per pt report: pt reports right flank that began on Sunday night and went away.  On Tuesday the pain returned and has stayed until today.  Pt reports burning upon urination. Pt is throat CA pt and last got tx two mondays ago.

## 2015-07-23 NOTE — ED Notes (Signed)
Gave patient a pillow

## 2015-07-24 ENCOUNTER — Ambulatory Visit (HOSPITAL_COMMUNITY): Payer: Medicaid Other

## 2015-07-24 ENCOUNTER — Other Ambulatory Visit: Payer: Self-pay | Admitting: Hematology and Oncology

## 2015-07-24 ENCOUNTER — Encounter (HOSPITAL_COMMUNITY): Payer: Medicaid Other

## 2015-07-24 LAB — GC/CHLAMYDIA PROBE AMP (~~LOC~~) NOT AT ARMC
Chlamydia: NEGATIVE
NEISSERIA GONORRHEA: NEGATIVE

## 2015-07-24 NOTE — ED Provider Notes (Signed)
CSN: [id number]     Arrival date & time 07/23/15  1337 History   First MD Initiated Contact with Patient 07/23/15 1451     Chief Complaint  Patient presents with  . Flank Pain     HPI Patient presents the emergency department with waxing and waning right flank pain with radiation towards his right groin which has improved but is still present.  He also reports that he may have had a faint amount of discharge from his penis.  He is happily married and has had no new sexual partners.  He denies ongoing discharge from his penis at this time.  No testicular pain.  Denies nausea vomiting.  No fevers or chills.  No other complaints at this time   Past Medical History  Diagnosis Date  . Knee pain, chronic   . Anxiety     mild new dx  . Arthritis     knees,hips  . Concussion     Hx: in high school x 2  . Hypertension   . PEG (percutaneous endoscopic gastrostomy) status ([place])   . Malnutrition ([place])   . Bilateral edema of lower extremity   . Abnormal liver function test   . Anemia   . Constipation   . Hypokalemia   . Fever   . Hypoglycemia   . Renal failure, acute ([place])   . Severe nausea and vomiting   . Hyperactive gag reflex   . Status post chemotherapy     Only received 2 doses due to uncontrolled nausea and acute renal failure  . S/P radiation therapy 08/19/2013-10/15/2013    Right Tonstil and bilateral neck / 70 Gy in 35 fractions to gross disease, 63 Gy in 35 fractions to high risk nodal echelons, and 56 Gy in 35 fractions to intermediate risk nodal echelons  . Non-healing surgical wound 05/23/2014  . Complication of anesthesia     Pt stated " my oxygen level was slow in rising."  . 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 Acute sinusitis, unspecified 05/18/2015  . Insomnia 06/15/2015   Past Surgical History  Procedure Laterality Date  . Multiple extractions with alveoloplasty N/A 08/01/2013    Procedure: Extraction of tooth #'s 1,15,17,31, 32 with  alveoloplasty, mandibular left torus reduction, and gross debridement of remaining teeth.;  Surgeon: [name], DDS;  Location: [facility];  Service: Oral Surgery;  Laterality: N/A;  . Laparoscopic gastrostomy N/A 08/15/2013    Procedure: LAPAROSCOPIC GASTROSTOMY TUBE PLACEMENT;  Surgeon: [name], MD;  Location: [place];  Service: General;  Laterality: N/A;  . Portacath placement Left 08/15/2013    Procedure: INSERTION PORT-A-CATH;  Surgeon: [name], MD;  Location: [facility];  Service: General;  Laterality: Left;  . Lymph node biopsy  03/20/14    right neck  . Port-a-cath removal N/A 05/13/2014    Procedure: REMOVAL of PORT-A-CATH;  Surgeon: [name], MD;  Location: [facility];  Service: General;  Laterality: N/A;   Family History  Problem Relation Age of Onset  . CVA    . Diabetes    . Heart attack    . Arthritis Father   . Arthritis Mother    Social History  Substance Use Topics  . Smoking status: Former Smoker -- 1.00 packs/day for 20 years    Types: Cigarettes    Quit date: 07/16/2003  . Smokeless tobacco: Never Used  . Alcohol Use: No     Comment: none years ago  Review of Systems  All other systems reviewed and are negative.     Allergies  Pollen extract  Home Medications   Prior to Admission medications   Medication Sig Start Date End Date Taking? Authorizing Provider  fluticasone (FLONASE) 50 MCG/ACT nasal spray Place 2 sprays into both nostrils daily. Patient taking differently: Place 2 sprays into both nostrils daily as needed for allergies.  07/25/14  Yes Lorayne Marek, MD  lidocaine-prilocaine (EMLA) cream Apply 1 application topically as needed. 12/02/14  Yes [name], MD  Multiple Vitamin (MULTIVITAMIN WITH MINERALS) TABS tablet Take 1 tablet by mouth daily.   Yes Historical Provider, MD  ondansetron (ZOFRAN) 8 MG tablet Take 1 tablet (8 mg total) by mouth every 8 (eight) hours as needed for nausea or vomiting. 06/15/15  Yes [name], MD   Oxycodone HCl 10 MG TABS Take 1 tablet (10 mg total) by mouth every 6 (six) hours as needed. 07/06/15  Yes [name], MD  traZODone (DESYREL) 100 MG tablet Take 1 tablet (100 mg total) by mouth at bedtime. 07/06/15  Yes [name], MD  omeprazole (PRILOSEC) 20 MG capsule Take 1 capsule (20 mg total) by mouth daily. Patient not taking: Reported on 07/23/2015 06/13/15   Waynetta Pean, PA-C  ondansetron (ZOFRAN ODT) 4 MG disintegrating tablet Take 1 tablet (4 mg total) by mouth every 8 (eight) hours as needed for nausea or vomiting. Patient not taking: Reported on 07/23/2015 06/13/15   Waynetta Pean, PA-C  prochlorperazine (COMPAZINE) 10 MG tablet Take 1 tablet (10 mg total) by mouth every 6 (six) hours as needed (Nausea or vomiting). Patient not taking: Reported on 07/23/2015 07/06/15   [name], MD   BP 143/83 mmHg  Pulse 66  Temp(Src) 98.1 F (36.7 C)  Resp 16  Ht 6\' 7"  (2.007 m)  Wt 250 lb (113.399 kg)  BMI 28.15 kg/m2  SpO2 100% Physical Exam  Constitutional: He is oriented to person, place, and time. He appears well-developed and well-nourished.  HENT:  Head: Normocephalic and atraumatic.  Eyes: EOM are normal.  Neck: Normal range of motion.  Cardiovascular: Normal rate, regular rhythm, normal heart sounds and intact distal pulses.   Pulmonary/Chest: Effort normal and breath sounds normal. No respiratory distress.  Abdominal: Soft. He exhibits no distension. There is no tenderness.  Genitourinary:  Normal-appearing circumcised penis.  No penile lesions noted.  No discharge noted from urethral meatus.  Testicles normal  Musculoskeletal: Normal range of motion.  Neurological: He is alert and oriented to person, place, and time.  Skin: Skin is warm and dry.  Psychiatric: He has a normal mood and affect. Judgment normal.  Nursing note and vitals reviewed.   ED Course  Procedures (including critical care time) Labs Review Labs Reviewed  URINALYSIS, ROUTINE W REFLEX MICROSCOPIC (NOT  AT [facility]) - Abnormal; Notable for the following:    Leukocytes, UA SMALL (*)    All other components within normal limits  CBC - Abnormal; Notable for the following:    RBC 3.11 (*)    Hemoglobin 10.5 (*)    HCT 31.3 (*)    MCV 100.6 (*)    RDW 15.6 (*)    All other components within normal limits  BASIC METABOLIC PANEL - Abnormal; Notable for the following:    Creatinine, Ser 1.76 (*)    GFR calc non Af Amer 44 (*)    GFR calc Af Amer 51 (*)    All other components within normal limits  URINE MICROSCOPIC-ADD  ON - Abnormal; Notable for the following:    Squamous Epithelial / LPF 0-5 (*)    Bacteria, UA RARE (*)    All other components within normal limits  GC/CHLAMYDIA PROBE AMP ([place]) NOT AT [facility]    Imaging Review Ct Renal Stone Study  07/23/2015  CLINICAL DATA:  Right flank pain starting Sunday night, new pain starting Tuesday EXAM: CT ABDOMEN AND PELVIS WITHOUT CONTRAST TECHNIQUE: Multidetector CT imaging of the abdomen and pelvis was performed following the standard protocol without IV contrast. COMPARISON:  None. FINDINGS: Lower chest:  Lung bases are unremarkable. Hepatobiliary: Unenhanced liver is unremarkable. No intrahepatic biliary ductal dilatation. The gallbladder is contracted without evidence of calcified gallstones. Pancreas: Unenhanced pancreas unremarkable. Spleen: Unenhanced spleen is unremarkable. Adrenals/Urinary Tract: Unenhanced kidneys are symmetrical in size. No adrenal gland mass. No nephrolithiasis. No hydronephrosis or hydroureter. No calcified ureteral calculi are noted. No calcified calculi are noted within under distended urinary bladder. Stomach/Bowel: No gastric outlet obstruction. No small bowel obstruction. No thickened or dilated small bowel loops. Moderate stool noted in right colon. Terminal ileum is unremarkable. Normal appendix is noted in axial image 57. No distal colonic obstruction. Few colonic diverticula are noted. No evidence of acute  diverticulitis. No distal colonic obstruction. Moderate gas noted within rectum. Vascular/Lymphatic: Mild atherosclerotic calcifications of distal abdominal aorta and iliac arteries. No aortic aneurysm. There is no retroperitoneal or mesenteric adenopathy. Reproductive: Prostate gland and seminal vesicles are unremarkable. Other: There is no inguinal adenopathy.  No ascites or free air. Musculoskeletal: No destructive bony lesions are noted. Sagittal images of the spine shows degenerative changes thoracolumbar spine. Significant disc space flattening with vacuum disc phenomenon at L3-L4 and L4-L5 level. Disc space flattening with vacuum disc phenomena L5-S1 level. IMPRESSION: 1. There is no evidence of nephrolithiasis. No hydronephrosis or hydroureter. No calcified ureteral calculi. 2. Moderate stool noted in right colon and cecum. No pericecal inflammation. Normal appendix. 3. No small bowel obstruction. 4. No calcified calculi are noted within under distended urinary bladder. 5. Few colonic diverticula.  No evidence of acute diverticulitis. Electronically Signed   By: Lahoma Crocker M.D.   On: 07/23/2015 15:43   I have personally reviewed and evaluated these images and lab results as part of my medical decision-making.   EKG Interpretation None      MDM   Final diagnoses:  Flank pain    Urinalysis and CT imaging without acute abnormality.  GC chlamydia urethral swab sent for evaluation.  No discharge at this time.  Will wait on treatment until GC chlamydia probes return.  Discharge home in good condition.  Primary care follow-up    Jola Schmidt, MD 07/24/15 1431

## 2015-07-27 ENCOUNTER — Other Ambulatory Visit: Payer: Self-pay | Admitting: Hematology and Oncology

## 2015-07-27 ENCOUNTER — Encounter: Payer: Self-pay | Admitting: *Deleted

## 2015-07-27 ENCOUNTER — Telehealth: Payer: Self-pay | Admitting: Hematology and Oncology

## 2015-07-27 ENCOUNTER — Encounter: Payer: Self-pay | Admitting: Hematology and Oncology

## 2015-07-27 ENCOUNTER — Telehealth: Payer: Self-pay | Admitting: *Deleted

## 2015-07-27 ENCOUNTER — Other Ambulatory Visit: Payer: Self-pay | Admitting: *Deleted

## 2015-07-27 ENCOUNTER — Ambulatory Visit: Payer: Self-pay

## 2015-07-27 ENCOUNTER — Other Ambulatory Visit: Payer: Self-pay

## 2015-07-27 DIAGNOSIS — C099 Malignant neoplasm of tonsil, unspecified: Secondary | ICD-10-CM

## 2015-07-27 MED ORDER — OXYCODONE HCL 10 MG PO TABS: 10.0000 mg | ORAL_TABLET | Freq: Four times a day (QID) | ORAL | Status: DC | PRN

## 2015-07-27 MED FILL — oxyCODONE HCL 10 MG TABS: 10 | 22 days supply | Qty: 90 | Fill #0

## 2015-07-27 NOTE — Telephone Encounter (Signed)
left msg for Mason [name] appt

## 2015-07-27 NOTE — Progress Notes (Signed)
Spoke with patient, per dr Alvy Bimler, appts cancelled today for labs and chemo, d/t pet ct not done last week. darlena clark notified by vm for prior auth process needed asap. Patient did request a refill on his oxycodone, since he was already here for his appt that was cancelled. Script left at front for patient p/u. Patient notified.

## 2015-07-27 NOTE — Telephone Encounter (Signed)
See previous note

## 2015-07-27 NOTE — Telephone Encounter (Signed)
Per dr Alvy Bimler, Lm for darlena clark for asap prior authorization for pet ct scan that was denied.

## 2015-07-27 NOTE — Telephone Encounter (Signed)
Spoke with patient gave date and time for ct scan at [facility] 08/04/15 @ 12:30. Npo 6 hours before. Dr Alvy Bimler 08/05/15 @ 11:00. Patient verbalized understanding.

## 2015-07-28 NOTE — Progress Notes (Signed)
This encounter was created in error - please disregard.

## 2015-07-29 ENCOUNTER — Ambulatory Visit: Payer: Self-pay | Admitting: Internal Medicine

## 2015-07-31 IMAGING — CR DG CHEST 1V PORT
1 series · 1 of 1 positions shown · non-contrast
Comparison: PET-CT 07/25/2013

CLINICAL DATA: Hypoxia after tooth extraction.

EXAM:
PORTABLE CHEST - 1 VIEW

[AP]
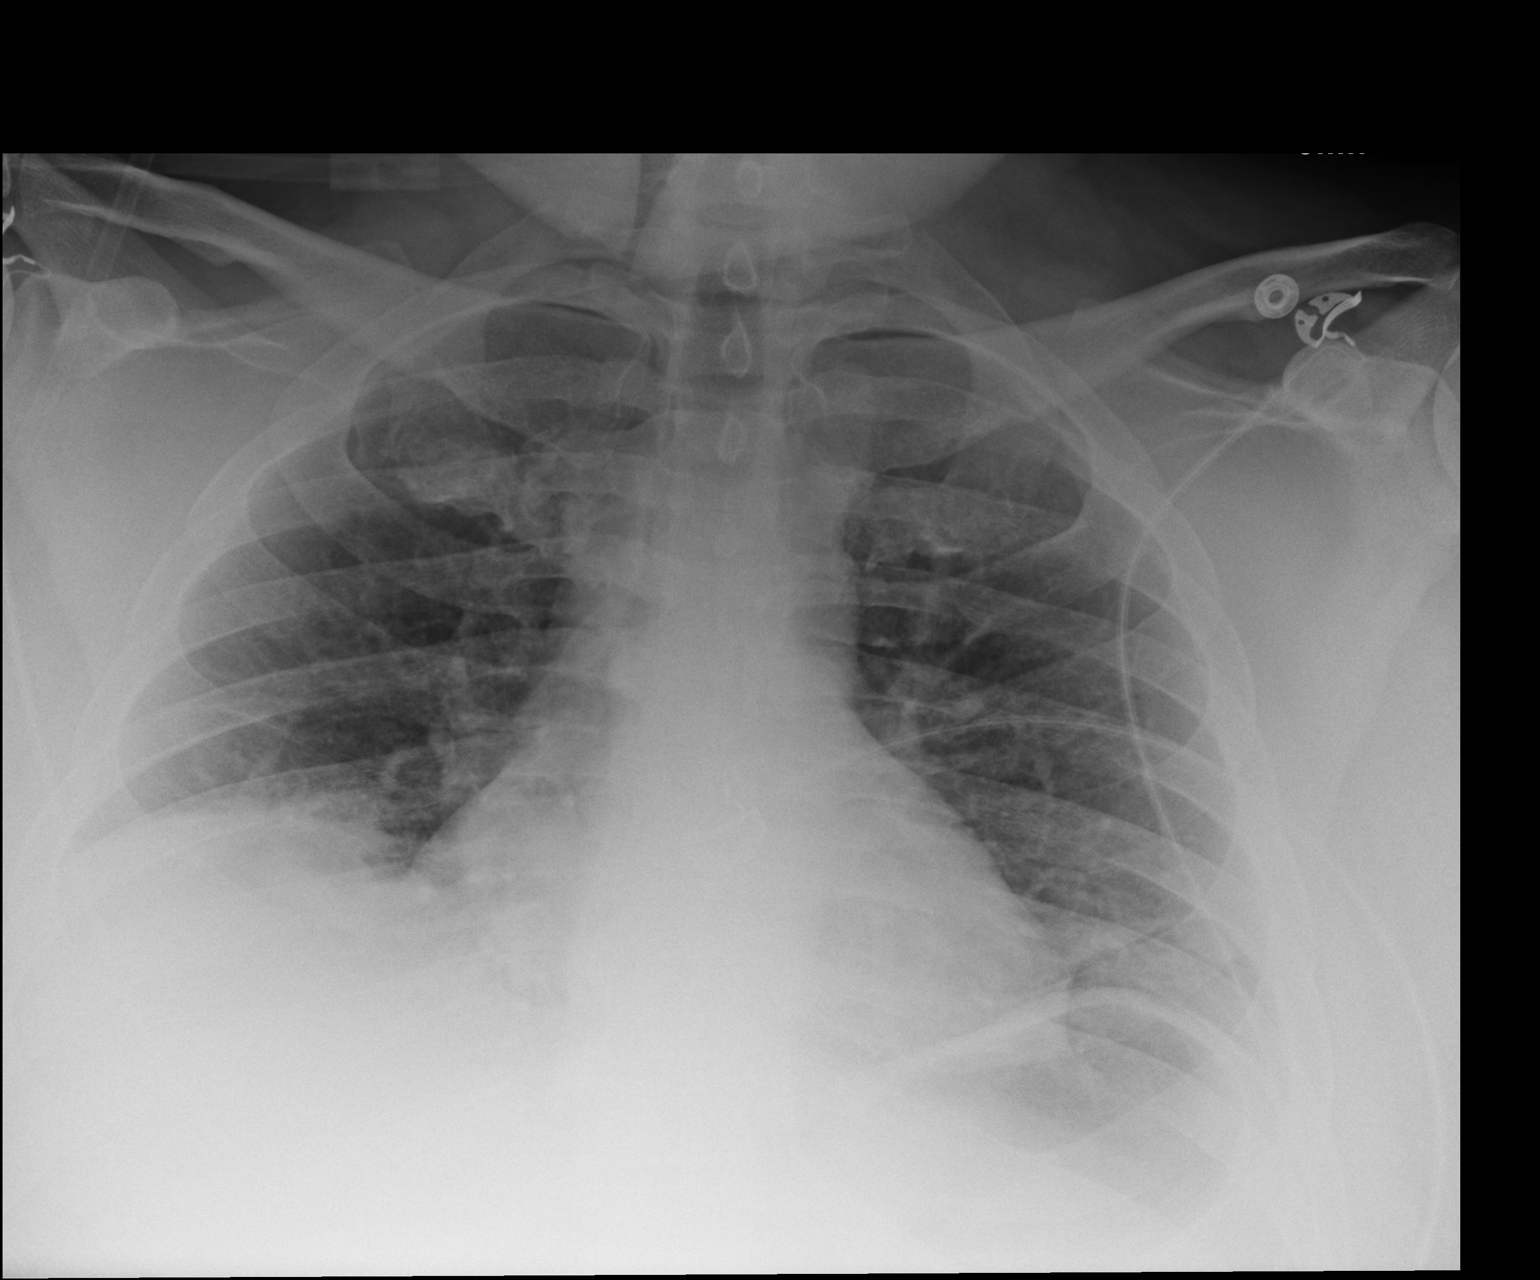

[1 of 1 positions shown; findings below may reference images not displayed]

FINDINGS: Single view of the chest demonstrates low lung volumes with
elevation of the right hemidiaphragm. Few densities at the right
lung base are suggestive for atelectasis. Heart size is normal. The
trachea is midline and negative for a pneumothorax.
IMPRESSION: Low lung volumes with right basilar atelectasis.

## 2015-08-03 ENCOUNTER — Ambulatory Visit: Payer: Self-pay

## 2015-08-03 ENCOUNTER — Other Ambulatory Visit: Payer: Self-pay

## 2015-08-04 ENCOUNTER — Ambulatory Visit (HOSPITAL_COMMUNITY)
Admission: RE | Admit: 2015-08-04 | Discharge: 2015-08-04 | Disposition: A | Discharge status: Home / Self Care | Payer: Medicaid Other | Source: Ambulatory Visit | Attending: Hematology and Oncology | Admitting: Hematology and Oncology

## 2015-08-04 DIAGNOSIS — Z923 Personal history of irradiation: Secondary | ICD-10-CM | POA: Diagnosis not present

## 2015-08-04 DIAGNOSIS — R938 Abnormal findings on diagnostic imaging of other specified body structures: Secondary | ICD-10-CM | POA: Diagnosis not present

## 2015-08-04 DIAGNOSIS — Z9221 Personal history of antineoplastic chemotherapy: Secondary | ICD-10-CM | POA: Diagnosis not present

## 2015-08-04 DIAGNOSIS — C099 Malignant neoplasm of tonsil, unspecified: Secondary | ICD-10-CM

## 2015-08-04 LAB — GLUCOSE, CAPILLARY: Glucose-Capillary: 84 mg/dL (ref 65–99)

## 2015-08-04 MED ORDER — FLUDEOXYGLUCOSE F - 18 (FDG) INJECTION: 12.4000 | Freq: Once | INTRAVENOUS | Status: DC | PRN

## 2015-08-05 ENCOUNTER — Telehealth: Payer: Self-pay | Admitting: *Deleted

## 2015-08-05 ENCOUNTER — Other Ambulatory Visit: Payer: Self-pay | Admitting: Hematology and Oncology

## 2015-08-05 ENCOUNTER — Ambulatory Visit (HOSPITAL_BASED_OUTPATIENT_CLINIC_OR_DEPARTMENT_OTHER): Payer: Medicaid Other | Admitting: Hematology and Oncology

## 2015-08-05 ENCOUNTER — Telehealth: Payer: Self-pay | Admitting: Hematology and Oncology

## 2015-08-05 VITALS — BP 137/84 | HR 76 | Temp 98.3°F | Resp 18 | Ht 79.0 in | Wt 246.2 lb

## 2015-08-05 DIAGNOSIS — G893 Neoplasm related pain (acute) (chronic): Secondary | ICD-10-CM

## 2015-08-05 DIAGNOSIS — C099 Malignant neoplasm of tonsil, unspecified: Secondary | ICD-10-CM

## 2015-08-05 DIAGNOSIS — Z515 Encounter for palliative care: Secondary | ICD-10-CM

## 2015-08-05 NOTE — Telephone Encounter (Signed)
Gave and printed appt sched and avs for pt for may °

## 2015-08-05 NOTE — Progress Notes (Signed)
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 FNA of right level III lymph node was inconclusive for cancer   07/25/2013 Imaging PET scan showed locally advanced disease with abnormal lymphadenopathy in the right axilla   08/06/2013 Procedure CT-guided biopsy of the lymphadenopathy was negative for malignancy   08/15/2013 Surgery Patient has placement of port and feeding tube   08/19/2013 - 09/10/2013 Chemotherapy Patient received chemotherapy with cisplatin. The patient only received 2 doses due to uncontrolled nausea and acute renal failure.   08/19/2013 - 10/15/2013 Radiation Therapy Received Helical IMRT Tomotherapy:  Right Tonstil and bilateral neck / 70 Gy in 35 fractions to gross disease, 63 Gy in 35 fractions to high risk nodal echelons, and 56 Gy in 35 fractions to intermediate risk nodal echelons.   08/27/2013 - 08/30/2013 Hospital Admission The patient was admitted to the hospital for uncontrolled nausea vomiting and dehydration.   02/14/2014 Imaging PET/CT scan showed complete response to treatment   03/19/2014 Surgery He had excisional lymph node biopsy from the right neck. Pathology was benign   05/13/2014 Surgery He had removal of Port-A-Cath.    05/22/2014 Imaging Repeat CT scan of the neck show no evidence of disease recurrence.   12/09/2014 Imaging Ct neck without contrast showed persistent abnormalities on the right side of neck, indeterminate   12/25/2014 Imaging PET CT scan showed disease recurrence.   02/10/2015 Procedure He has placement of port   02/13/2015 - 07/13/2015 Chemotherapy He received chemotherapy with carbo/Taxol   04/21/2015 Imaging PET CT scan showed improved disease control   08/04/2015 Imaging PET scan showed persistent disease    INTERVAL HISTORY: Please see below for problem oriented charting. He returns to discuss test results. He is concerned because of worsening pain at night. He also has been waking up frequently because of pain and taking 2 tablets of oxycodone each time to get his pain in the control. He rated his pain about 5 out of 10. He denies recent nausea vomiting. No worsening peripheral neuropathy.  REVIEW OF SYSTEMS:   Constitutional: Denies fevers, chills or abnormal weight loss Eyes: Denies blurriness of vision Ears, nose, mouth, throat, and face: Denies mucositis or sore throat Respiratory: Denies cough, dyspnea or wheezes Cardiovascular: Denies palpitation, chest discomfort or lower extremity swelling Gastrointestinal:  Denies nausea, heartburn or change in bowel habits Skin: Denies abnormal skin rashes Lymphatics: Denies new lymphadenopathy or easy bruising Neurological:Denies numbness, tingling or new weaknesses Behavioral/Psych: Mood is stable, no new changes  All other systems were reviewed with the patient and are negative.  I have reviewed the past medical history, past surgical history, social history and family history with the patient and they are unchanged from previous note.  ALLERGIES:  is allergic to pollen extract.  MEDICATIONS:  Current Outpatient Prescriptions  Medication Sig Dispense Refill  . fluticasone (FLONASE) 50 MCG/ACT nasal spray Place 2 sprays into both nostrils  daily. (Patient taking differently: Place 2 sprays into both nostrils daily as needed for allergies. ) 16 g 2  . lidocaine-prilocaine (EMLA) cream Apply 1 application topically as needed. 30 g 6  . Multiple Vitamin (MULTIVITAMIN WITH MINERALS) TABS tablet Take 1 tablet by mouth daily.    Marland Kitchen omeprazole (PRILOSEC) 20 MG capsule Take 1 capsule (20 mg total) by mouth daily. 30 capsule 0  . ondansetron (ZOFRAN ODT) 4 MG disintegrating tablet Take 1 tablet (4 mg total) by mouth every 8 (eight) hours as needed for nausea or vomiting. 10 tablet 0  . ondansetron (ZOFRAN) 8 MG tablet Take 1 tablet (8 mg total) by mouth every 8 (eight) hours as needed for nausea or vomiting. 90 tablet 1  . Oxycodone HCl 10 MG TABS Take 1 tablet (10 mg total) by mouth every 6 (six) hours as needed. 90 tablet 0  . prochlorperazine (COMPAZINE) 10 MG tablet Take 1 tablet (10 mg total) by mouth every 6 (six) hours as needed (Nausea or vomiting). 60 tablet 1  . traZODone (DESYREL) 100 MG tablet Take 1 tablet (100 mg total) by mouth at bedtime. 30 tablet 6   No current facility-administered medications for this visit.   Facility-Administered Medications Ordered in Other Visits  Medication Dose Route Frequency Provider Last Rate Last Dose  . fludeoxyglucose F - 18 (FDG) injection 12.4 milli Curie  12.4 milli Curie Intravenous Once PRN Suella Broad, MD        PHYSICAL EXAMINATION: ECOG PERFORMANCE STATUS: 1 - Symptomatic but completely ambulatory  Filed Vitals:   08/05/15 1014  BP: 137/84  Pulse: 76  Temp: 98.3 F (36.8 C)  Resp: 18   Filed Weights   08/05/15 1014  Weight: 246 lb 3.2 oz (111.676 kg)    GENERAL:alert, no distress and comfortable SKIN: skin color, texture, turgor are normal, no rashes or significant lesions EYES: normal, Conjunctiva are pink and non-injected, sclera clear OROPHARYNX:no exudate, no erythema and lips, buccal mucosa, and tongue normal  NECK: Neck is Woody from prior radiation and  surgery. LYMPH:  no palpable lymphadenopathy in the cervical, axillary or inguinal LUNGS: clear to auscultation and percussion with normal breathing effort HEART: regular rate & rhythm and no murmurs and no lower extremity edema ABDOMEN:abdomen soft, non-tender and normal bowel sounds Musculoskeletal:no cyanosis of digits and no clubbing  NEURO: alert & oriented x 3 with fluent speech, no focal motor/sensory deficits  LABORATORY DATA:  I have reviewed the data as listed    Component Value Date/Time   NA 139 07/23/2015 1513   NA 140 07/13/2015 1130   K 4.3 07/23/2015 1513   K 4.5 07/13/2015 1130   CL 107 07/23/2015 1513   CO2 26 07/23/2015 1513   CO2 27 07/13/2015 1130   GLUCOSE 88 07/23/2015 1513   GLUCOSE 84 07/13/2015 1130   BUN 19 07/23/2015 1513   BUN 22.9 07/13/2015 1130   CREATININE 1.76* 07/23/2015 1513   CREATININE 1.5* 07/13/2015 1130   CALCIUM 8.9 07/23/2015 1513   CALCIUM 9.3 07/13/2015 1130   PROT 7.2 07/13/2015 1130   PROT 7.8 06/13/2015 1849   ALBUMIN 4.0 07/13/2015 1130   ALBUMIN 4.4 06/13/2015 1849   AST 26 07/13/2015 1130   AST 25 06/13/2015 1849   ALT 17 07/13/2015 1130   ALT 20 06/13/2015 1849   ALKPHOS 58 07/13/2015 1130  ALKPHOS 60 06/13/2015 1849   BILITOT 0.44 07/13/2015 1130   BILITOT 0.6 06/13/2015 1849   GFRNONAA 44* 07/23/2015 1513   GFRAA 51* 07/23/2015 1513    No results found for: SPEP, UPEP  Lab Results  Component Value Date   WBC 4.0 07/23/2015   NEUTROABS 2.9 07/13/2015   HGB 10.5* 07/23/2015   HCT 31.3* 07/23/2015   MCV 100.6* 07/23/2015   PLT 257 07/23/2015      Chemistry      Component Value Date/Time   NA 139 07/23/2015 1513   NA 140 07/13/2015 1130   K 4.3 07/23/2015 1513   K 4.5 07/13/2015 1130   CL 107 07/23/2015 1513   CO2 26 07/23/2015 1513   CO2 27 07/13/2015 1130   BUN 19 07/23/2015 1513   BUN 22.9 07/13/2015 1130   CREATININE 1.76* 07/23/2015 1513   CREATININE 1.5* 07/13/2015 1130      Component Value  Date/Time   CALCIUM 8.9 07/23/2015 1513   CALCIUM 9.3 07/13/2015 1130   ALKPHOS 58 07/13/2015 1130   ALKPHOS 60 06/13/2015 1849   AST 26 07/13/2015 1130   AST 25 06/13/2015 1849   ALT 17 07/13/2015 1130   ALT 20 06/13/2015 1849   BILITOT 0.44 07/13/2015 1130   BILITOT 0.6 06/13/2015 1849       RADIOGRAPHIC STUDIES: I have personally reviewed the radiological images as listed and agreed with the findings in the report. Nm Pet Image Restag (ps) Skull Base To Thigh  08/04/2015  CLINICAL DATA:  Subsequent head neck carcinoma. Treatment strategy for tonsillar carcinoma of the RIGHT tonsil status post radiation and chemotherapy. EXAM: NUCLEAR MEDICINE PET SKULL BASE TO THIGH TECHNIQUE: 12.6 mCi F-18 FDG was injected intravenously. Full-ring PET imaging was performed from the skull base to thigh after the radiotracer. CT data was obtained and used for attenuation correction and anatomic localization. FASTING BLOOD GLUCOSE:  Value: 86 mg/dl COMPARISON:  PET-CT 04/21/2015, 12/25/2014, 01/18/2014, 07/25/2013 FINDINGS: NECK There is residual metabolic activity in the RIGHT neck at the treatment and excisional biopsy sites. There is asymmetric activity at the level of the surgical clips in the RIGHT level 2 neck with SUV max equal 9.8 increased from 5.7. Laterally at the same level linear activity which extends the skin surface is moderately metabolic activity activity with SUV max equals 7.7 increased from 6.5. No significant abnormal metabolic activity in the mucosa of the hypopharynx. No hypermetabolic contralateral nodes. There is metabolic activity bilaterally in the musculature musculature inferior to the styloid processes. Bilateral metabolic activity in strap muscles additionally. CHEST No hypermetabolic mediastinal or hilar nodes. No suspicious pulmonary nodules on the CT scan. ABDOMEN/PELVIS No abnormal hypermetabolic activity within the liver, pancreas, adrenal glands, or spleen. No hypermetabolic  lymph nodes in the abdomen or pelvis. SKELETON No focal hypermetabolic activity to suggest skeletal metastasis. IMPRESSION: 1. Increased metabolic activity in the RIGHT neck at the level of the excisional biopsy clips is concerning for residual carcinoma. Findings similar to PET-CT scan of 12/25/2014 and increased from 04/21/2015. 2. More peripheral linear activity in the RIGHT neck is persistent and similar also concerning for residual carcinoma versus postsurgical inflammation. 3. No evidence distant disease. Electronically Signed   By: Suzy Bouchard M.D.   On: 08/04/2015 16:13     ASSESSMENT & PLAN:  Tonsillar cancer Role of treatment is palliative and it is a recommendation according to NCCN guidelines  On November 21, 2014, the U. S. Food and Drug Administration granted accelerated approval  to pembrolizumab for the treatment of patients with recurrent or metastatic head and neck squamous cell carcinoma (HNSCC) with disease progression on or after platinum-containing chemotherapy.   The approval was based on demonstration of a durable objective response rate (ORR) in a subgroup of patients in an international, multicenter, non-randomized, open-label, multi-cohort study. This subgroup included 174 patients with recurrent or metastatic HNSCC who had disease progression on or after platinum-containing chemotherapy. Patients received intravenous pembrolizumab 10 mg/kg every 2 weeks or 200 mg every 3 weeks.   ORR was determined by an independent review committee according to Response Evaluation Criteria in Solid Tumors (RECIST) 1.1. The ORR for these 174 patients was 16% (95% confidence interval [CI] 11, 22). The median response duration had not been reached at the time of analysis. The range for duration of response was 2.4 months to 27.7 months (response ongoing). Among the 28 responding patients, 23 (82%) had responses of 6 months or longer.   Safety data was evaluated in 192 patients with HNSCC  receiving at least one dose of pembrolizumab 10 mg/kg every 2 weeks or 200 mg every 3 weeks. The most common (greater than or equal to 20%) adverse reactions were fatigue, decreased appetite, and dyspnea. Adverse reactions occurring in patients with HNSCC were similar to those occurring in patients with melanoma or NSCLC, with the exception of an increased incidence of facial edema (10% all grades, 2.1% grades 3-4) and new or worsening hypothyroidism (14.6% all grades). The most frequent (greater than or equal to 2%) serious adverse reactions were pneumonia, dyspnea, confusional state, vomiting, pleural effusion, and respiratory failure. Clinically significant immune-mediated adverse reactions included pneumonitis, colitis, hepatitis, adrenal insufficiency, diabetes mellitus, skin toxicity, myositis, and thyroid disorders.   The recommended dose and schedule of pembrolizumab for this indication is 200 mg administered as an intravenous infusion over 30 minutes every 3 weeks.   We discussed some of the risks, benefits, side-effects of Pembrolizumab  Some of the short term side-effects included, though not limited to, risk of fatigue, pancytopenia, life-threatening infections, allergic reactions, nausea, vomiting, hepatitis, changes in bowel habits especially diarrhea, admission to hospital for various reasons, and risks of death.   The patient is aware that the response rates discussed earlier is not guaranteed.    After a long discussion, patient made an informed decision to proceed.   Patient education material was dispensed. Per patient request, he would like to start treatment on May 1st. I will see him prior to cycle 2 of treatment  Cancer associated pain He has worsening pain control from his disease. We discussed starting him back on long-acting pain medicine but the patient is afraid of side effects including nausea. After a lot of discussion, he would like to have his oxycodone dose  increased. We just refilled his prescription a week ago. I told the patient to call back next week but is due for refill and I will increase his oxycodone to 30 mg.  Palliative care by specialist The patient is discouraged with the test result. However, on the bright side, he has no evidence of distant metastatic disease. At present time, he is still not a surgical candidate. With localized disease, he would be a potential candidate for repeat radiation therapy in the future but for now, I would like to try systemic treatment first. We discussed spiritual support. I think it is a good idea that he goes away to celebrate his birthday with family next week   Orders Placed This Encounter  Procedures  .  CBC with Differential    Standing Status: Standing     Number of Occurrences: 20     Standing Expiration Date: 08/05/2016  . Comprehensive metabolic panel    Standing Status: Standing     Number of Occurrences: 20     Standing Expiration Date: 08/05/2016  . TSH    Standing Status: Standing     Number of Occurrences: 22     Standing Expiration Date: 08/04/2016   All questions were answered. The patient knows to call the clinic with any problems, questions or concerns. No barriers to learning was detected. I spent 30 minutes counseling the patient face to face. The total time spent in the appointment was 40 minutes and more than 50% was on counseling and review of test results     [facility], [place], MD 08/05/2015 1:47 PM

## 2015-08-05 NOTE — Patient Instructions (Signed)
Pembrolizumab injection What is this medicine? PEMBROLIZUMAB (pem broe liz ue mab) is a monoclonal antibody. It is used to treat melanoma and non-small cell lung cancer. This medicine may be used for other purposes; ask your health care provider or pharmacist if you have questions. What should I tell my health care provider before I take this medicine? They need to know if you have any of these conditions: -diabetes -immune system problems -inflammatory bowel disease -liver disease -lung or breathing disease -lupus -an unusual or allergic reaction to pembrolizumab, other medicines, foods, dyes, or preservatives -pregnant or trying to get pregnant -breast-feeding How should I use this medicine? This medicine is for infusion into a vein. It is given by a health care professional in a hospital or clinic setting. A special MedGuide will be given to you before each treatment. Be sure to read this information carefully each time. Talk to your pediatrician regarding the use of this medicine in children. Special care may be needed. Overdosage: If you think you have taken too much of this medicine contact a poison control center or emergency room at once. NOTE: This medicine is only for you. Do not share this medicine with others. What if I miss a dose? It is important not to miss your dose. Call your doctor or health care professional if you are unable to keep an appointment. What may interact with this medicine? Interactions have not been studied. Give your health care provider a list of all the medicines, herbs, non-prescription drugs, or dietary supplements you use. Also tell them if you smoke, drink alcohol, or use illegal drugs. Some items may interact with your medicine. This list may not describe all possible interactions. Give your health care provider a list of all the medicines, herbs, non-prescription drugs, or dietary supplements you use. Also tell them if you smoke, drink alcohol, or  use illegal drugs. Some items may interact with your medicine. What should I watch for while using this medicine? Your condition will be monitored carefully while you are receiving this medicine. You may need blood work done while you are taking this medicine. Do not become pregnant while taking this medicine or for 4 months after stopping it. Women should inform their doctor if they wish to become pregnant or think they might be pregnant. There is a potential for serious side effects to an unborn child. Talk to your health care professional or pharmacist for more information. Do not breast-feed an infant while taking this medicine or for 4 months after the last dose. What side effects may I notice from receiving this medicine? Side effects that you should report to your doctor or health care professional as soon as possible: -allergic reactions like skin rash, itching or hives, swelling of the face, lips, or tongue -bloody or black, tarry stools -breathing problems -change in the amount of urine -changes in vision -chest pain -chills -dark urine -dizziness or feeling faint or lightheaded -fast or irregular heartbeat -fever -flushing -hair loss -muscle pain -muscle weakness -persistent headache -signs and symptoms of high blood sugar such as dizziness; dry mouth; dry skin; fruity breath; nausea; stomach pain; increased hunger or thirst; increased urination -signs and symptoms of liver injury like dark urine, light-colored stools, loss of appetite, nausea, right upper belly pain, yellowing of the eyes or skin -stomach pain -weight loss Side effects that usually do not require medical attention (Report these to your doctor or health care professional if they continue or are bothersome.):constipation -cough -diarrhea -joint pain -  tiredness This list may not describe all possible side effects. Call your doctor for medical advice about side effects. You may report side effects to FDA at  1-800-FDA-1088. Where should I keep my medicine? This drug is given in a hospital or clinic and will not be stored at home. NOTE: This sheet is a summary. It may not cover all possible information. If you have questions about this medicine, talk to your doctor, pharmacist, or health care provider.    2016, Elsevier/Gold Standard. (2014-06-03 17:24:19)  

## 2015-08-05 NOTE — Telephone Encounter (Signed)
Pt asks if he can come earlier for his appt today.  Instructed pt to come in now as soon as he can for earlier appt..  He agreed.  Dr. Alvy Bimler aware.

## 2015-08-05 NOTE — Assessment & Plan Note (Addendum)
The patient is discouraged with the test result. However, on the bright side, he has no evidence of distant metastatic disease. At present time, he is still not a surgical candidate. With localized disease, he would be a potential candidate for repeat radiation therapy in the future but for now, I would like to try systemic treatment first. We discussed spiritual support. I think it is a good idea that he goes away to celebrate his birthday with family next week

## 2015-08-05 NOTE — Assessment & Plan Note (Signed)
Role of treatment is palliative and it is a recommendation according to NCCN guidelines  On November 21, 2014, the U. S. Food and Drug Administration granted accelerated approval to pembrolizumab for the treatment of patients with recurrent or metastatic head and neck squamous cell carcinoma (HNSCC) with disease progression on or after platinum-containing chemotherapy.   The approval was based on demonstration of a durable objective response rate (ORR) in a subgroup of patients in an international, multicenter, non-randomized, open-label, multi-cohort study. This subgroup included 174 patients with recurrent or metastatic HNSCC who had disease progression on or after platinum-containing chemotherapy. Patients received intravenous pembrolizumab 10 mg/kg every 2 weeks or 200 mg every 3 weeks.   ORR was determined by an independent review committee according to Response Evaluation Criteria in Solid Tumors (RECIST) 1.1. The ORR for these 174 patients was 16% (95% confidence interval [CI] 11, 22). The median response duration had not been reached at the time of analysis. The range for duration of response was 2.4 months to 27.7 months (response ongoing). Among the 28 responding patients, 23 (82%) had responses of 6 months or longer.   Safety data was evaluated in 192 patients with HNSCC receiving at least one dose of pembrolizumab 10 mg/kg every 2 weeks or 200 mg every 3 weeks. The most common (greater than or equal to 20%) adverse reactions were fatigue, decreased appetite, and dyspnea. Adverse reactions occurring in patients with HNSCC were similar to those occurring in patients with melanoma or NSCLC, with the exception of an increased incidence of facial edema (10% all grades, 2.1% grades 3-4) and new or worsening hypothyroidism (14.6% all grades). The most frequent (greater than or equal to 2%) serious adverse reactions were pneumonia, dyspnea, confusional state, vomiting, pleural effusion, and respiratory  failure. Clinically significant immune-mediated adverse reactions included pneumonitis, colitis, hepatitis, adrenal insufficiency, diabetes mellitus, skin toxicity, myositis, and thyroid disorders.   The recommended dose and schedule of pembrolizumab for this indication is 200 mg administered as an intravenous infusion over 30 minutes every 3 weeks.   We discussed some of the risks, benefits, side-effects of Pembrolizumab  Some of the short term side-effects included, though not limited to, risk of fatigue, pancytopenia, life-threatening infections, allergic reactions, nausea, vomiting, hepatitis, changes in bowel habits especially diarrhea, admission to hospital for various reasons, and risks of death.   The patient is aware that the response rates discussed earlier is not guaranteed.    After a long discussion, patient made an informed decision to proceed.   Patient education material was dispensed. Per patient request, he would like to start treatment on May 1st. I will see him prior to cycle 2 of treatment

## 2015-08-05 NOTE — Assessment & Plan Note (Signed)
He has worsening pain control from his disease. We discussed starting him back on long-acting pain medicine but the patient is afraid of side effects including nausea. After a lot of discussion, he would like to have his oxycodone dose increased. We just refilled his prescription a week ago. I told the patient to call back next week but is due for refill and I will increase his oxycodone to 30 mg.

## 2015-08-10 ENCOUNTER — Telehealth: Payer: Self-pay | Admitting: *Deleted

## 2015-08-10 MED ORDER — OXYCODONE HCL 10 MG PO TABS: 10.0000 mg | ORAL_TABLET | Freq: Four times a day (QID) | ORAL | Status: DC | PRN

## 2015-08-10 MED ORDER — OXYCODONE HCL 10 MG PO TABS: 10.0000 mg | ORAL_TABLET | ORAL | Status: DC | PRN

## 2015-08-10 MED FILL — traZODone HCL 100 MG TABS: 100 | 30 days supply | Qty: 30 | Fill #0

## 2015-08-10 MED FILL — oxyCODONE HCL 10 MG TABS: 10 | 15 days supply | Qty: 90 | Fill #0

## 2015-08-10 NOTE — Telephone Encounter (Signed)
Pt requests refill on oxycodone.  Instructed pt to come by to pick up Rx.   Ready for pick up.  He verbalized understanding.

## 2015-08-17 ENCOUNTER — Other Ambulatory Visit: Payer: Self-pay | Admitting: Hematology and Oncology

## 2015-08-17 ENCOUNTER — Ambulatory Visit (HOSPITAL_BASED_OUTPATIENT_CLINIC_OR_DEPARTMENT_OTHER): Payer: Medicaid Other

## 2015-08-17 ENCOUNTER — Ambulatory Visit: Payer: Medicaid Other

## 2015-08-17 ENCOUNTER — Other Ambulatory Visit (HOSPITAL_BASED_OUTPATIENT_CLINIC_OR_DEPARTMENT_OTHER): Payer: Medicaid Other

## 2015-08-17 DIAGNOSIS — C099 Malignant neoplasm of tonsil, unspecified: Secondary | ICD-10-CM | POA: Diagnosis not present

## 2015-08-17 DIAGNOSIS — Z79899 Other long term (current) drug therapy: Secondary | ICD-10-CM

## 2015-08-17 DIAGNOSIS — Z5112 Encounter for antineoplastic immunotherapy: Secondary | ICD-10-CM

## 2015-08-17 LAB — COMPREHENSIVE METABOLIC PANEL
ALT: 18 U/L (ref 0–55)
ANION GAP: 9 meq/L (ref 3–11)
AST: 23 U/L (ref 5–34)
Albumin: 4.1 g/dL (ref 3.5–5.0)
Alkaline Phosphatase: 56 U/L (ref 40–150)
BUN: 20.1 mg/dL (ref 7.0–26.0)
CHLORIDE: 105 meq/L (ref 98–109)
CO2: 26 meq/L (ref 22–29)
CREATININE: 1.6 mg/dL — AB (ref 0.7–1.3)
Calcium: 9.7 mg/dL (ref 8.4–10.4)
EGFR: 60 mL/min/{1.73_m2} — ABNORMAL LOW (ref >90)
GLUCOSE: 94 mg/dL (ref 70–140)
Potassium: 4.2 mEq/L (ref 3.5–5.1)
SODIUM: 140 meq/L (ref 136–145)
Total Bilirubin: 0.5 mg/dL (ref 0.20–1.20)
Total Protein: 7.4 g/dL (ref 6.4–8.3)

## 2015-08-17 LAB — CBC WITH DIFFERENTIAL/PLATELET
BASO%: 0.2 % (ref 0.0–2.0)
BASOS ABS: 0 10*3/uL (ref 0.0–0.1)
EOS ABS: 0.2 10*3/uL (ref 0.0–0.5)
EOS%: 2.8 % (ref 0.0–7.0)
HCT: 36.9 % — ABNORMAL LOW (ref 38.4–49.9)
HGB: 12.2 g/dL — ABNORMAL LOW (ref 13.0–17.1)
LYMPH%: 10.1 % — AB (ref 14.0–49.0)
MCH: 33 pg (ref 27.2–33.4)
MCHC: 33.1 g/dL (ref 32.0–36.0)
MCV: 99.7 fL — AB (ref 79.3–98.0)
MONO#: 0.4 10*3/uL (ref 0.1–0.9)
MONO%: 7.1 % (ref 0.0–14.0)
NEUT#: 4.5 10*3/uL (ref 1.5–6.5)
NEUT%: 79.8 % — ABNORMAL HIGH (ref 39.0–75.0)
Platelets: 134 10*3/uL — ABNORMAL LOW (ref 140–400)
RBC: 3.7 10*6/uL — AB (ref 4.20–5.82)
RDW: 14.3 % (ref 11.0–14.6)
WBC: 5.6 10*3/uL (ref 4.0–10.3)
lymph#: 0.6 10*3/uL — ABNORMAL LOW (ref 0.9–3.3)

## 2015-08-17 LAB — TSH: TSH: 2.201 m(IU)/L (ref 0.320–4.118)

## 2015-08-17 MED ORDER — SODIUM CHLORIDE 0.9 % IJ SOLN
10.0000 mL | INTRAMUSCULAR | Status: DC | PRN
  Administered 2015-08-17: 10 mL via INTRAVENOUS
  Filled 2015-08-17: qty 10

## 2015-08-17 MED ORDER — SODIUM CHLORIDE 0.9 % IV SOLN
Freq: Once | INTRAVENOUS | Status: AC
  Administered 2015-08-17: 10:00:00 via INTRAVENOUS

## 2015-08-17 MED ORDER — SODIUM CHLORIDE 0.9% FLUSH
10.0000 mL | INTRAVENOUS | Status: DC | PRN
  Administered 2015-08-17: 10 mL
  Filled 2015-08-17: qty 10

## 2015-08-17 MED ORDER — HEPARIN SOD (PORK) LOCK FLUSH 100 UNIT/ML IV SOLN
500.0000 [IU] | Freq: Once | INTRAVENOUS | Status: AC | PRN
  Administered 2015-08-17: 500 [IU]
  Filled 2015-08-17: qty 5

## 2015-08-17 MED ORDER — SODIUM CHLORIDE 0.9 % IV SOLN
200.0000 mg | Freq: Once | INTRAVENOUS | Status: AC
  Administered 2015-08-17: 200 mg via INTRAVENOUS
  Filled 2015-08-17: qty 8

## 2015-08-17 NOTE — Patient Instructions (Signed)
Pembrolizumab injection What is this medicine? PEMBROLIZUMAB (pem broe liz ue mab) is a monoclonal antibody. It is used to treat melanoma and non-small cell lung cancer. This medicine may be used for other purposes; ask your health care provider or pharmacist if you have questions. What should I tell my health care provider before I take this medicine? They need to know if you have any of these conditions: -diabetes -immune system problems -inflammatory bowel disease -liver disease -lung or breathing disease -lupus -an unusual or allergic reaction to pembrolizumab, other medicines, foods, dyes, or preservatives -pregnant or trying to get pregnant -breast-feeding How should I use this medicine? This medicine is for infusion into a vein. It is given by a health care professional in a hospital or clinic setting. A special MedGuide will be given to you before each treatment. Be sure to read this information carefully each time. Talk to your pediatrician regarding the use of this medicine in children. Special care may be needed. Overdosage: If you think you have taken too much of this medicine contact a poison control center or emergency room at once. NOTE: This medicine is only for you. Do not share this medicine with others. What if I miss a dose? It is important not to miss your dose. Call your doctor or health care professional if you are unable to keep an appointment. What may interact with this medicine? Interactions have not been studied. Give your health care provider a list of all the medicines, herbs, non-prescription drugs, or dietary supplements you use. Also tell them if you smoke, drink alcohol, or use illegal drugs. Some items may interact with your medicine. This list may not describe all possible interactions. Give your health care provider a list of all the medicines, herbs, non-prescription drugs, or dietary supplements you use. Also tell them if you smoke, drink alcohol, or  use illegal drugs. Some items may interact with your medicine. What should I watch for while using this medicine? Your condition will be monitored carefully while you are receiving this medicine. You may need blood work done while you are taking this medicine. Do not become pregnant while taking this medicine or for 4 months after stopping it. Women should inform their doctor if they wish to become pregnant or think they might be pregnant. There is a potential for serious side effects to an unborn child. Talk to your health care professional or pharmacist for more information. Do not breast-feed an infant while taking this medicine or for 4 months after the last dose. What side effects may I notice from receiving this medicine? Side effects that you should report to your doctor or health care professional as soon as possible: -allergic reactions like skin rash, itching or hives, swelling of the face, lips, or tongue -bloody or black, tarry stools -breathing problems -change in the amount of urine -changes in vision -chest pain -chills -dark urine -dizziness or feeling faint or lightheaded -fast or irregular heartbeat -fever -flushing -hair loss -muscle pain -muscle weakness -persistent headache -signs and symptoms of high blood sugar such as dizziness; dry mouth; dry skin; fruity breath; nausea; stomach pain; increased hunger or thirst; increased urination -signs and symptoms of liver injury like dark urine, light-colored stools, loss of appetite, nausea, right upper belly pain, yellowing of the eyes or skin -stomach pain -weight loss Side effects that usually do not require medical attention (Report these to your doctor or health care professional if they continue or are bothersome.):constipation -cough -diarrhea -joint pain -  tiredness This list may not describe all possible side effects. Call your doctor for medical advice about side effects. You may report side effects to FDA at  1-800-FDA-1088. Where should I keep my medicine? This drug is given in a hospital or clinic and will not be stored at home. NOTE: This sheet is a summary. It may not cover all possible information. If you have questions about this medicine, talk to your doctor, pharmacist, or health care provider.    2016, Elsevier/Gold Standard. (2014-06-03 17:24:19) [facility] Discharge Instructions for Patients Receiving Chemotherapy  Today you received the following chemotherapy agents:  Keytruda  To help prevent nausea and vomiting after your treatment, we encourage you to take your nausea medication.   If you develop nausea and vomiting that is not controlled by your nausea medication, call the clinic.   BELOW ARE SYMPTOMS THAT SHOULD BE REPORTED IMMEDIATELY:  *FEVER GREATER THAN 100.5 F  *CHILLS WITH OR WITHOUT FEVER  NAUSEA AND VOMITING THAT IS NOT CONTROLLED WITH YOUR NAUSEA MEDICATION  *UNUSUAL SHORTNESS OF BREATH  *UNUSUAL BRUISING OR BLEEDING  TENDERNESS IN MOUTH AND THROAT WITH OR WITHOUT PRESENCE OF ULCERS  *URINARY PROBLEMS  *BOWEL PROBLEMS  UNUSUAL RASH Items with * indicate a potential emergency and should be followed up as soon as possible.  Feel free to call the clinic you have any questions or concerns. The clinic phone number is [phone].  Please show the [place] at check-in to the Emergency Department and triage nurse.     MammoSite Aftercare  Congratulations on the completion of MammoSite partial breast irradiation! Mild redness, bruising of the skin, and swelling are normal following MammoSite removal/radiation and will fade with time. Mild fatigue is also expected.  Once your MammoSite catheter/tube has been removed you will continue to dress the catheter site until the site is no longer draining and has closed over. Gauze and paper tape will be provided by your nurse after your catheter is removed. Please change your dressing twice  a day or as needed. To change your dressing, removed the old gauze and tape, cover the catheter site with two sterile 4x4 gauze pads and secure gauze in place with paper tape.    Please do not shower until catheter site has closed over, usually within 3-4 days.   If you experience any of the following symptoms after removal of your MammoSite, call your doctor:  . Fever greater than 100 F . Increased redness or red streaks on the treated breast . Increased swelling or warmth in the treated breast . Sudden drainage of a large amount of fluid from the catheter site (a small amount of thin clear fluid or even blood is not unusual for the first few days following catheter removal)  . Foul odor or a creamy, pale yellow or yellow-green drainage from the catheter site.  While healing if you have any questions or concerns feel free to contact your radiation oncology nurse between the hours of 8 am to 5 pm at [phone]. For assistance after hours phone [phone] and the answering service will page the radiation oncologist on call.   [place]  www.cancer.gov  RT. Answers    www.rtanswers.org  MammoSite    [email or web address].com  [email or web address]  American Cancer Society             www.cancer.org

## 2015-08-18 ENCOUNTER — Telehealth: Payer: Self-pay | Admitting: *Deleted

## 2015-08-18 ENCOUNTER — Telehealth: Payer: Self-pay

## 2015-08-18 NOTE — Telephone Encounter (Signed)
Pt called requesting to speak with Dr Alvy Bimler "it is not an emergency I just need to talk." Called pt back to clarify the issue and had to leave a voice message.

## 2015-08-18 NOTE — Telephone Encounter (Signed)
Recommend OTC zyrtec or claritin and cough syrup Unlikely bacterial infection

## 2015-08-18 NOTE — Telephone Encounter (Signed)
Dr Alvy Bimler states they discussed remaining on 10 mg dose. Pt is OK with that

## 2015-08-18 NOTE — Telephone Encounter (Signed)
Pt states he has developed a productive cough- brown/green sputum, head clogged, fever last night 100.1. Temp normal this morning.  States Dr Alvy Bimler wanted him to let her know of any problems

## 2015-08-18 NOTE — Telephone Encounter (Signed)
Pt notified to take OTC zyrtec or claritin and cough syrup. Unlikely bacterial infection.  Pt states that he and Dr Alvy Bimler had discussed increasing pain medication from 10 mg. He continues to have pain. He verbalized that he understands he might not need anything after a few treatment.

## 2015-08-19 ENCOUNTER — Other Ambulatory Visit: Payer: Self-pay | Admitting: *Deleted

## 2015-08-19 MED ORDER — OXYCODONE HCL 10 MG PO TABS: 10.0000 mg | ORAL_TABLET | ORAL | Status: DC | PRN

## 2015-08-19 MED FILL — oxyCODONE HCL 10 MG TABS: 10 | 15 days supply | Qty: 90 | Fill #0

## 2015-08-29 IMAGING — CR DG ABDOMEN 1V
1 series · 1 of 1 positions shown · non-contrast
Comparison: 08/28/2013

CLINICAL DATA: G-tube placement

EXAM:
ABDOMEN - 1 VIEW

[view not recorded]
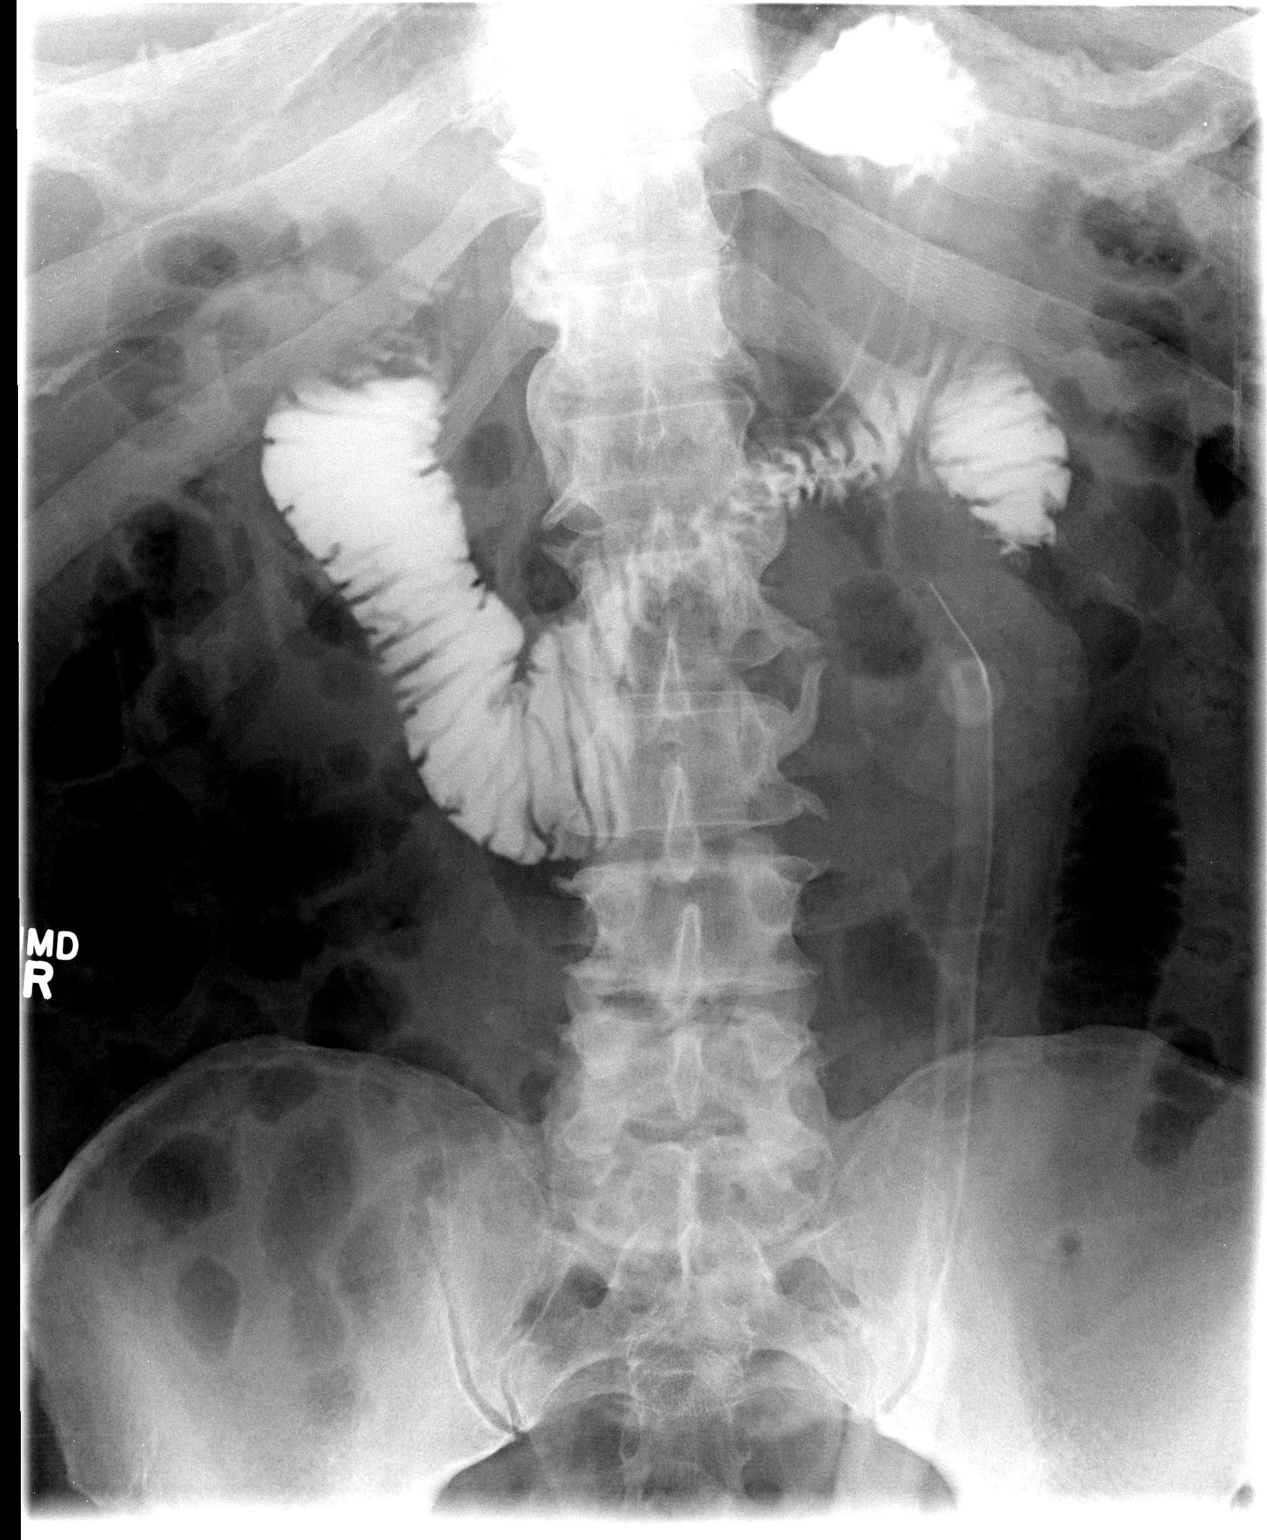

[1 of 1 positions shown; findings below may reference images not displayed]

FINDINGS: G-tube was injected with 50 cc of Omnipaque 300 and flushed with 50
cc of water.

Contrast opacifies gastric lumen and duodenum.

Findings compatible with intragastric position of gastrostomy tube.

No gastric outlet obstruction or contrast extravasation.

Bowel gas pattern normal.

Degenerative disc disease changes thoracolumbar spine.
IMPRESSION: Gastrostomy tube is located within the stomach.

## 2015-09-03 ENCOUNTER — Telehealth: Payer: Self-pay | Admitting: *Deleted

## 2015-09-03 NOTE — Telephone Encounter (Signed)
Instructed pt per Dr. Alvy Bimler to come in tomorrow to see her at 10:30 am.  Come in early at 10 am to check in.  May take 2 oxycodone every 4 hrs for pain and Dr. Alvy Bimler will refill pain meds on visit tomorrow.   Pt verbalized understanding.

## 2015-09-03 NOTE — Telephone Encounter (Signed)
Pt c/o increased pain,  "more than normal",  Swelling and now leaking clear fluid from surgical scar on right side of neck. Taking 2 oxycodone about every 6 to 7 hrs.  He does not think he has enough pain meds to make it through the weekend with his increase in pain.  And he is unsure if he should be concerned about the leaking.

## 2015-09-04 ENCOUNTER — Ambulatory Visit (HOSPITAL_BASED_OUTPATIENT_CLINIC_OR_DEPARTMENT_OTHER): Payer: Medicaid Other | Admitting: Hematology and Oncology

## 2015-09-04 ENCOUNTER — Other Ambulatory Visit: Payer: Self-pay | Admitting: *Deleted

## 2015-09-04 ENCOUNTER — Other Ambulatory Visit: Payer: Self-pay

## 2015-09-04 ENCOUNTER — Telehealth: Payer: Self-pay | Admitting: Hematology and Oncology

## 2015-09-04 ENCOUNTER — Encounter: Payer: Self-pay | Admitting: Hematology and Oncology

## 2015-09-04 VITALS — BP 145/82 | HR 73 | Temp 98.5°F | Resp 20 | Ht 79.0 in | Wt 248.0 lb

## 2015-09-04 DIAGNOSIS — R300 Dysuria: Secondary | ICD-10-CM | POA: Insufficient documentation

## 2015-09-04 DIAGNOSIS — G893 Neoplasm related pain (acute) (chronic): Secondary | ICD-10-CM | POA: Diagnosis not present

## 2015-09-04 DIAGNOSIS — C099 Malignant neoplasm of tonsil, unspecified: Secondary | ICD-10-CM

## 2015-09-04 HISTORY — DX: Dysuria: R30.0

## 2015-09-04 MED ORDER — OXYCODONE HCL 10 MG PO TABS: 20.0000 mg | ORAL_TABLET | ORAL | Status: DC | PRN

## 2015-09-04 MED FILL — oxyCODONE HCL 10 MG TABS: 10 | 8 days supply | Qty: 90 | Fill #0

## 2015-09-04 NOTE — Progress Notes (Signed)
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 FNA of right level III lymph node was inconclusive for cancer   07/25/2013 Imaging PET scan showed locally advanced disease with abnormal lymphadenopathy in the right axilla   08/06/2013 Procedure CT-guided biopsy of the lymphadenopathy was negative for malignancy   08/15/2013 Surgery Patient has placement of port and feeding tube   08/19/2013 - 09/10/2013 Chemotherapy Patient received chemotherapy with cisplatin. The patient only received 2 doses due to uncontrolled nausea and acute renal failure.   08/19/2013 - 10/15/2013 Radiation Therapy Received Helical IMRT Tomotherapy:  Right Tonstil and bilateral neck / 70 Gy in 35 fractions to gross disease, 63 Gy in 35 fractions to high risk nodal echelons, and 56 Gy in 35 fractions to intermediate risk nodal echelons.   08/27/2013 - 08/30/2013 Hospital Admission The patient was admitted to the hospital for uncontrolled nausea vomiting and dehydration.   02/14/2014 Imaging PET/CT scan showed complete response to treatment   03/19/2014 Surgery He had excisional lymph node biopsy from the right neck. Pathology was benign   05/13/2014 Surgery He had removal of Port-A-Cath.    05/22/2014 Imaging Repeat CT scan of the neck show no evidence of disease recurrence.   12/09/2014 Imaging Ct neck without contrast showed persistent abnormalities on the right side of neck, indeterminate   12/25/2014 Imaging PET CT scan showed disease recurrence.   02/10/2015 Procedure He has placement of port   02/13/2015 - 07/13/2015 Chemotherapy He received chemotherapy with carbo/Taxol   04/21/2015 Imaging PET CT scan showed improved disease control   08/04/2015 Imaging PET scan showed persistent disease   08/17/2015 -  Chemotherapy He was started with Keytruda    INTERVAL HISTORY: Please see below for problem oriented charting. He is seen prior to cycle 2. He is concerned for possible STD due to discomfort when he urinates. Also, after his first treatment, he started to complain of throbbing sensation on his right side of neck around May 11 lasting for several days which subsequently led to profuse discharge around 09/01/2015. Since then, the rest of the neck has healed well. He has increased pain and hence has been taking oxycodone at increasing amount. His pain appears to be well controlled. He tolerated cycle well of Keytruda well without significant nausea  REVIEW OF SYSTEMS:   Constitutional: Denies fevers, chills or abnormal weight loss Eyes: Denies blurriness of vision Ears, nose, mouth, throat, and face: Denies mucositis or sore throat Respiratory: Denies cough, dyspnea or wheezes Cardiovascular: Denies palpitation, chest discomfort or lower extremity swelling Gastrointestinal:  Denies nausea, heartburn or change in bowel habits Skin: Denies abnormal skin rashes Lymphatics: Denies new lymphadenopathy or easy bruising Neurological:Denies numbness, tingling or new weaknesses Behavioral/Psych: Mood is stable, no new changes  All other  systems were reviewed with the patient and are negative.  I have reviewed the past medical history, past surgical history, social history and family  history with the patient and they are unchanged from previous note.  ALLERGIES:  is allergic to pollen extract.  MEDICATIONS:  Current Outpatient Prescriptions  Medication Sig Dispense Refill  . fluticasone (FLONASE) 50 MCG/ACT nasal spray Place 2 sprays into both nostrils daily. (Patient taking differently: Place 2 sprays into both nostrils daily as needed for allergies. ) 16 g 2  . lidocaine-prilocaine (EMLA) cream Apply 1 application topically as needed. 30 g 6  . Multiple Vitamin (MULTIVITAMIN WITH MINERALS) TABS tablet Take 1 tablet by mouth daily.    Marland Kitchen omeprazole (PRILOSEC) 20 MG capsule Take 1 capsule (20 mg total) by mouth daily. 30 capsule 0  . Oxycodone HCl 10 MG TABS Take 2 tablets (20 mg total) by mouth every 4 (four) hours as needed. 90 tablet 0  . traZODone (DESYREL) 100 MG tablet Take 1 tablet (100 mg total) by mouth at bedtime. 30 tablet 6  . ondansetron (ZOFRAN ODT) 4 MG disintegrating tablet Take 1 tablet (4 mg total) by mouth every 8 (eight) hours as needed for nausea or vomiting. (Patient not taking: Reported on 09/04/2015) 10 tablet 0  . ondansetron (ZOFRAN) 8 MG tablet Take 1 tablet (8 mg total) by mouth every 8 (eight) hours as needed for nausea or vomiting. (Patient not taking: Reported on 09/04/2015) 90 tablet 1  . prochlorperazine (COMPAZINE) 10 MG tablet Take 1 tablet (10 mg total) by mouth every 6 (six) hours as needed (Nausea or vomiting). (Patient not taking: Reported on 09/04/2015) 60 tablet 1   No current facility-administered medications for this visit.    PHYSICAL EXAMINATION: ECOG PERFORMANCE STATUS: 0 - Asymptomatic  Filed Vitals:   09/04/15 1001  BP: 145/82  Pulse: 73  Temp: 98.5 F (36.9 C)  Resp: 20   Filed Weights   09/04/15 1001  Weight: 248 lb (112.492 kg)    GENERAL:alert, no distress and comfortable SKIN: skin color, texture, turgor are normal, no rashes or significant lesions EYES: normal, Conjunctiva are pink and non-injected, sclera  clear OROPHARYNX:no exudate, no erythema and lips, buccal mucosa, and tongue normal  NECK:Well-healed surgical scar. He has a small area on the right side of the neck with dry scab but no active discharge  LYMPH:  no palpable lymphadenopathy in the cervical, axillary or inguinal LUNGS: clear to auscultation and percussion with normal breathing effort HEART: regular rate & rhythm and no murmurs and no lower extremity edema ABDOMEN:abdomen soft, non-tender and normal bowel sounds Musculoskeletal:no cyanosis of digits and no clubbing  NEURO: alert & oriented x 3 with fluent speech, no focal motor/sensory deficits  LABORATORY DATA:  I have reviewed the data as listed    Component Value Date/Time   NA 140 08/17/2015 0925   NA 139 07/23/2015 1513   K 4.2 08/17/2015 0925   K 4.3 07/23/2015 1513   CL 107 07/23/2015 1513   CO2 26 08/17/2015 0925   CO2 26 07/23/2015 1513   GLUCOSE 94 08/17/2015 0925   GLUCOSE 88 07/23/2015 1513   BUN 20.1 08/17/2015 0925   BUN 19 07/23/2015 1513   CREATININE 1.6* 08/17/2015 0925   CREATININE 1.76* 07/23/2015 1513   CALCIUM 9.7 08/17/2015 0925   CALCIUM 8.9 07/23/2015 1513   PROT 7.4 08/17/2015 0925   PROT 7.8 06/13/2015 1849   ALBUMIN 4.1 08/17/2015 0925   ALBUMIN 4.4 06/13/2015 1849  AST 23 08/17/2015 0925   AST 25 06/13/2015 1849   ALT 18 08/17/2015 0925   ALT 20 06/13/2015 1849   ALKPHOS 56 08/17/2015 0925   ALKPHOS 60 06/13/2015 1849   BILITOT 0.50 08/17/2015 0925   BILITOT 0.6 06/13/2015 1849   GFRNONAA 44* 07/23/2015 1513   GFRAA 51* 07/23/2015 1513    No results found for: SPEP, UPEP  Lab Results  Component Value Date   WBC 5.6 08/17/2015   NEUTROABS 4.5 08/17/2015   HGB 12.2* 08/17/2015   HCT 36.9* 08/17/2015   MCV 99.7* 08/17/2015   PLT 134* 08/17/2015      Chemistry      Component Value Date/Time   NA 140 08/17/2015 0925   NA 139 07/23/2015 1513   K 4.2 08/17/2015 0925   K 4.3 07/23/2015 1513   CL 107 07/23/2015 1513    CO2 26 08/17/2015 0925   CO2 26 07/23/2015 1513   BUN 20.1 08/17/2015 0925   BUN 19 07/23/2015 1513   CREATININE 1.6* 08/17/2015 0925   CREATININE 1.76* 07/23/2015 1513      Component Value Date/Time   CALCIUM 9.7 08/17/2015 0925   CALCIUM 8.9 07/23/2015 1513   ALKPHOS 56 08/17/2015 0925   ALKPHOS 60 06/13/2015 1849   AST 23 08/17/2015 0925   AST 25 06/13/2015 1849   ALT 18 08/17/2015 0925   ALT 20 06/13/2015 1849   BILITOT 0.50 08/17/2015 0925   BILITOT 0.6 06/13/2015 1849      ASSESSMENT & PLAN:  Tonsillar cancer The patient described an event that could actually let me to believe that he has positive response to treatment. I reassured the patient. Plan to continue treatment as scheduled but I will not see him next Monday since irate he saw him today. 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 He is currently taking 20 mg of oxycodone, increased from 10 mg due to poor response to therapy. I refill his prescription today and reassured him that his symptoms may not be actually related to disease progression.  Dysuria The patient complained of sensation of dysuria. He is concerned for possible STD. I recommend urinalysis and urine culture to exclude infection and if the test is negative, we will refer him to urologist and he agreed   Orders Placed This Encounter  Procedures  . Urine culture    Standing Status: Future     Number of Occurrences:      Standing Expiration Date: 10/08/2016    Order Specific Question:  Source    Answer:  dysuria  . Urinalysis, Microscopic - [facility]    Standing Status: Future     Number of Occurrences:      Standing Expiration Date: 10/08/2016   All questions were answered. The patient knows to call the clinic with any problems, questions or concerns. No barriers to learning was detected. I spent 15 minutes counseling the patient face to face. The total time spent in the  appointment was 20 minutes and more than 50% was on counseling and review of test results     [facility] [place], MD 09/04/2015 5:06 PM

## 2015-09-04 NOTE — Assessment & Plan Note (Signed)
The patient described an event that could actually let me to believe that he has positive response to treatment. I reassured the patient. Plan to continue treatment as scheduled but I will not see him next Monday since irate he saw him today. I will see him prior to cycle 3 of treatment

## 2015-09-04 NOTE — Assessment & Plan Note (Signed)
The patient has cancer associated pain. He is currently taking 20 mg of oxycodone, increased from 10 mg due to poor response to therapy. I refill his prescription today and reassured him that his symptoms may not be actually related to disease progression.

## 2015-09-04 NOTE — Assessment & Plan Note (Signed)
The patient complained of sensation of dysuria. He is concerned for possible STD. I recommend urinalysis and urine culture to exclude infection and if the test is negative, we will refer him to urologist and he agreed

## 2015-09-04 NOTE — Telephone Encounter (Signed)
Gave and printed appt shced and avs for pt for June °

## 2015-09-07 ENCOUNTER — Ambulatory Visit: Payer: Self-pay | Admitting: Hematology and Oncology

## 2015-09-07 ENCOUNTER — Other Ambulatory Visit: Payer: Self-pay

## 2015-09-07 ENCOUNTER — Other Ambulatory Visit (HOSPITAL_BASED_OUTPATIENT_CLINIC_OR_DEPARTMENT_OTHER): Payer: Medicaid Other

## 2015-09-07 ENCOUNTER — Ambulatory Visit (HOSPITAL_BASED_OUTPATIENT_CLINIC_OR_DEPARTMENT_OTHER): Payer: Medicaid Other

## 2015-09-07 ENCOUNTER — Ambulatory Visit: Payer: Medicaid Other

## 2015-09-07 VITALS — BP 124/82 | HR 82 | Temp 98.3°F | Resp 18

## 2015-09-07 DIAGNOSIS — Z5112 Encounter for antineoplastic immunotherapy: Secondary | ICD-10-CM

## 2015-09-07 DIAGNOSIS — C099 Malignant neoplasm of tonsil, unspecified: Secondary | ICD-10-CM

## 2015-09-07 DIAGNOSIS — R300 Dysuria: Secondary | ICD-10-CM | POA: Diagnosis not present

## 2015-09-07 DIAGNOSIS — Z79899 Other long term (current) drug therapy: Secondary | ICD-10-CM

## 2015-09-07 LAB — URINALYSIS, MICROSCOPIC - CHCC
BLOOD: NEGATIVE
Bilirubin (Urine): NEGATIVE
GLUCOSE UR CHCC: NEGATIVE mg/dL
KETONES: NEGATIVE mg/dL
Nitrite: NEGATIVE
PH: 6 (ref 4.6–8.0)
RBC / HPF: NEGATIVE (ref 0–2)
SPECIFIC GRAVITY, URINE: 1.015 (ref 1.003–1.035)
Urobilinogen, UR: 0.2 mg/dL (ref 0.2–1)

## 2015-09-07 LAB — COMPREHENSIVE METABOLIC PANEL
ALBUMIN: 4 g/dL (ref 3.5–5.0)
ALK PHOS: 79 U/L (ref 40–150)
ALT: 19 U/L (ref 0–55)
ANION GAP: 9 meq/L (ref 3–11)
AST: 29 U/L (ref 5–34)
BUN: 14.5 mg/dL (ref 7.0–26.0)
CHLORIDE: 105 meq/L (ref 98–109)
CO2: 27 mEq/L (ref 22–29)
CREATININE: 1.6 mg/dL — AB (ref 0.7–1.3)
Calcium: 9.7 mg/dL (ref 8.4–10.4)
EGFR: 57 mL/min/{1.73_m2} — ABNORMAL LOW (ref >90)
Glucose: 77 mg/dl (ref 70–140)
Potassium: 4.4 mEq/L (ref 3.5–5.1)
Sodium: 140 mEq/L (ref 136–145)
Total Bilirubin: <0.3 mg/dL (ref 0.20–1.20)
Total Protein: 7.5 g/dL (ref 6.4–8.3)

## 2015-09-07 LAB — CBC WITH DIFFERENTIAL/PLATELET
BASO%: 0.2 % (ref 0.0–2.0)
BASOS ABS: 0 10*3/uL (ref 0.0–0.1)
EOS%: 1.2 % (ref 0.0–7.0)
Eosinophils Absolute: 0.1 10*3/uL (ref 0.0–0.5)
HEMATOCRIT: 37.8 % — AB (ref 38.4–49.9)
HGB: 12.6 g/dL — ABNORMAL LOW (ref 13.0–17.1)
LYMPH#: 0.8 10*3/uL — AB (ref 0.9–3.3)
LYMPH%: 16.1 % (ref 14.0–49.0)
MCH: 33 pg (ref 27.2–33.4)
MCHC: 33.3 g/dL (ref 32.0–36.0)
MCV: 99 fL — ABNORMAL HIGH (ref 79.3–98.0)
MONO#: 0.4 10*3/uL (ref 0.1–0.9)
MONO%: 8.2 % (ref 0.0–14.0)
NEUT#: 3.8 10*3/uL (ref 1.5–6.5)
NEUT%: 74.3 % (ref 39.0–75.0)
PLATELETS: 237 10*3/uL (ref 140–400)
RBC: 3.82 10*6/uL — ABNORMAL LOW (ref 4.20–5.82)
RDW: 13.7 % (ref 11.0–14.6)
WBC: 5.1 10*3/uL (ref 4.0–10.3)

## 2015-09-07 LAB — TSH: TSH: 2.859 m(IU)/L (ref 0.320–4.118)

## 2015-09-07 MED ORDER — SODIUM CHLORIDE 0.9% FLUSH
10.0000 mL | INTRAVENOUS | Status: DC | PRN
  Administered 2015-09-07: 10 mL
  Filled 2015-09-07: qty 10

## 2015-09-07 MED ORDER — SODIUM CHLORIDE 0.9 % IJ SOLN
10.0000 mL | INTRAMUSCULAR | Status: DC | PRN
  Administered 2015-09-07: 10 mL via INTRAVENOUS
  Filled 2015-09-07: qty 10

## 2015-09-07 MED ORDER — HEPARIN SOD (PORK) LOCK FLUSH 100 UNIT/ML IV SOLN
500.0000 [IU] | Freq: Once | INTRAVENOUS | Status: AC | PRN
  Administered 2015-09-07: 500 [IU]
  Filled 2015-09-07: qty 5

## 2015-09-07 MED ORDER — SODIUM CHLORIDE 0.9 % IV SOLN
200.0000 mg | Freq: Once | INTRAVENOUS | Status: AC
  Administered 2015-09-07: 200 mg via INTRAVENOUS
  Filled 2015-09-07: qty 8

## 2015-09-07 MED ORDER — SODIUM CHLORIDE 0.9 % IV SOLN
Freq: Once | INTRAVENOUS | Status: AC
  Administered 2015-09-07: 10:00:00 via INTRAVENOUS

## 2015-09-07 NOTE — Patient Instructions (Signed)

## 2015-09-07 NOTE — Patient Instructions (Signed)
[facility] Discharge Instructions for Patients Receiving Chemotherapy  Today you received the following chemotherapy agents:  Keytruda.  To help prevent nausea and vomiting after your treatment, we encourage you to take your nausea medication as prescribed.   If you develop nausea and vomiting that is not controlled by your nausea medication, call the clinic.   BELOW ARE SYMPTOMS THAT SHOULD BE REPORTED IMMEDIATELY:  *FEVER GREATER THAN 100.5 F  *CHILLS WITH OR WITHOUT FEVER  NAUSEA AND VOMITING THAT IS NOT CONTROLLED WITH YOUR NAUSEA MEDICATION  *UNUSUAL SHORTNESS OF BREATH  *UNUSUAL BRUISING OR BLEEDING  TENDERNESS IN MOUTH AND THROAT WITH OR WITHOUT PRESENCE OF ULCERS  *URINARY PROBLEMS  *BOWEL PROBLEMS  UNUSUAL RASH Items with * indicate a potential emergency and should be followed up as soon as possible.  Feel free to call the clinic you have any questions or concerns. The clinic phone number is [phone].  Please show the CHEMO ALERT CARD at check-in to the Emergency Department and triage nurse.   

## 2015-09-08 LAB — URINE CULTURE: ORGANISM ID, BACTERIA: NO GROWTH

## 2015-09-09 ENCOUNTER — Telehealth: Payer: Self-pay | Admitting: *Deleted

## 2015-09-09 ENCOUNTER — Telehealth: Payer: Self-pay | Admitting: Hematology and Oncology

## 2015-09-09 DIAGNOSIS — C099 Malignant neoplasm of tonsil, unspecified: Secondary | ICD-10-CM

## 2015-09-09 DIAGNOSIS — R300 Dysuria: Secondary | ICD-10-CM

## 2015-09-09 NOTE — Telephone Encounter (Signed)
left msg confirming urology apt 7/17

## 2015-09-09 NOTE — Telephone Encounter (Signed)
Faxed pt medical records to [facility]. Nurse Navigator will review Ins and records if approved they will call pt with appt. Slides and scans will be fedex'ed

## 2015-09-09 NOTE — Telephone Encounter (Signed)
Notified pt of Dr. Calton Dach message and  Referral order placed to [facility].  Pt verbalized understanding.

## 2015-09-09 NOTE — Telephone Encounter (Signed)
Faxed pt medical records to [facility] °

## 2015-09-09 NOTE — Telephone Encounter (Signed)
-----   Message from [name], MD sent at 09/09/2015  7:24 AM EDT ----- Regarding: urine test Pls let him know urine test is clear As discussed, please refer him to [facility] for penile pain and discharge  ----- Message -----    From: [facility] Interface    Sent: 09/07/2015   9:38 AM      To: [name], MD

## 2015-09-21 ENCOUNTER — Telehealth: Payer: Self-pay | Admitting: *Deleted

## 2015-09-21 DIAGNOSIS — G4700 Insomnia, unspecified: Secondary | ICD-10-CM

## 2015-09-21 MED ORDER — TRAZODONE HCL 100 MG PO TABS: 100.0000 mg | ORAL_TABLET | Freq: Every day | ORAL | Status: DC

## 2015-09-21 MED ORDER — OXYCODONE HCL 10 MG PO TABS: 20.0000 mg | ORAL_TABLET | ORAL | Status: DC | PRN

## 2015-09-21 MED FILL — oxyCODONE HCL 10 MG TABS: 10 | 7 days supply | Qty: 90 | Fill #0

## 2015-09-21 NOTE — Telephone Encounter (Signed)
Pt requests refill on oxycodone and trazodone.  Instructed pt to come by to pick up Rxs.  Will be ready for pick up soon.  He verbalized understanding.

## 2015-09-28 ENCOUNTER — Telehealth: Payer: Self-pay | Admitting: *Deleted

## 2015-09-28 ENCOUNTER — Encounter: Payer: Self-pay | Admitting: Hematology and Oncology

## 2015-09-28 ENCOUNTER — Other Ambulatory Visit: Payer: Self-pay | Admitting: Hematology and Oncology

## 2015-09-28 ENCOUNTER — Ambulatory Visit (HOSPITAL_BASED_OUTPATIENT_CLINIC_OR_DEPARTMENT_OTHER): Payer: Medicaid Other | Admitting: Hematology and Oncology

## 2015-09-28 ENCOUNTER — Other Ambulatory Visit (HOSPITAL_BASED_OUTPATIENT_CLINIC_OR_DEPARTMENT_OTHER): Payer: Medicaid Other

## 2015-09-28 ENCOUNTER — Ambulatory Visit: Payer: Medicaid Other

## 2015-09-28 ENCOUNTER — Ambulatory Visit (HOSPITAL_BASED_OUTPATIENT_CLINIC_OR_DEPARTMENT_OTHER): Payer: Medicaid Other

## 2015-09-28 VITALS — BP 121/77 | HR 78 | Temp 98.4°F | Resp 18 | Ht 79.0 in | Wt 249.4 lb

## 2015-09-28 DIAGNOSIS — G893 Neoplasm related pain (acute) (chronic): Secondary | ICD-10-CM

## 2015-09-28 DIAGNOSIS — Z79899 Other long term (current) drug therapy: Secondary | ICD-10-CM | POA: Diagnosis not present

## 2015-09-28 DIAGNOSIS — C770 Secondary and unspecified malignant neoplasm of lymph nodes of head, face and neck: Secondary | ICD-10-CM | POA: Diagnosis not present

## 2015-09-28 DIAGNOSIS — C099 Malignant neoplasm of tonsil, unspecified: Secondary | ICD-10-CM

## 2015-09-28 DIAGNOSIS — Z5112 Encounter for antineoplastic immunotherapy: Secondary | ICD-10-CM | POA: Diagnosis not present

## 2015-09-28 DIAGNOSIS — Z95828 Presence of other vascular implants and grafts: Secondary | ICD-10-CM

## 2015-09-28 DIAGNOSIS — N182 Chronic kidney disease, stage 2 (mild): Secondary | ICD-10-CM | POA: Diagnosis not present

## 2015-09-28 LAB — CBC WITH DIFFERENTIAL/PLATELET
BASO%: 0.3 % (ref 0.0–2.0)
Basophils Absolute: 0 10*3/uL (ref 0.0–0.1)
EOS%: 2 % (ref 0.0–7.0)
Eosinophils Absolute: 0.1 10*3/uL (ref 0.0–0.5)
HEMATOCRIT: 39 % (ref 38.4–49.9)
HEMOGLOBIN: 12.7 g/dL — AB (ref 13.0–17.1)
LYMPH%: 8.3 % — ABNORMAL LOW (ref 14.0–49.0)
MCH: 32.1 pg (ref 27.2–33.4)
MCHC: 32.6 g/dL (ref 32.0–36.0)
MCV: 98.5 fL — ABNORMAL HIGH (ref 79.3–98.0)
MONO#: 0.5 10*3/uL (ref 0.1–0.9)
MONO%: 7.6 % (ref 0.0–14.0)
NEUT%: 81.8 % — AB (ref 39.0–75.0)
NEUTROS ABS: 5.8 10*3/uL (ref 1.5–6.5)
PLATELETS: 208 10*3/uL (ref 140–400)
RBC: 3.96 10*6/uL — ABNORMAL LOW (ref 4.20–5.82)
RDW: 13.4 % (ref 11.0–14.6)
WBC: 7.1 10*3/uL (ref 4.0–10.3)
lymph#: 0.6 10*3/uL — ABNORMAL LOW (ref 0.9–3.3)

## 2015-09-28 LAB — COMPREHENSIVE METABOLIC PANEL
ALBUMIN: 4 g/dL (ref 3.5–5.0)
ALK PHOS: 57 U/L (ref 40–150)
ALT: 16 U/L (ref 0–55)
ANION GAP: 8 meq/L (ref 3–11)
AST: 23 U/L (ref 5–34)
BUN: 17.9 mg/dL (ref 7.0–26.0)
CALCIUM: 9.4 mg/dL (ref 8.4–10.4)
CO2: 25 mEq/L (ref 22–29)
CREATININE: 1.6 mg/dL — AB (ref 0.7–1.3)
Chloride: 107 mEq/L (ref 98–109)
EGFR: 60 mL/min/{1.73_m2} — ABNORMAL LOW (ref >90)
Glucose: 84 mg/dl (ref 70–140)
Potassium: 4.4 mEq/L (ref 3.5–5.1)
Sodium: 140 mEq/L (ref 136–145)
TOTAL PROTEIN: 7.4 g/dL (ref 6.4–8.3)
Total Bilirubin: 0.4 mg/dL (ref 0.20–1.20)

## 2015-09-28 LAB — TSH: TSH: 1.93 m[IU]/L (ref 0.320–4.118)

## 2015-09-28 MED ORDER — SODIUM CHLORIDE 0.9 % IV SOLN
Freq: Once | INTRAVENOUS | Status: AC
  Administered 2015-09-28: 11:00:00 via INTRAVENOUS

## 2015-09-28 MED ORDER — SODIUM CHLORIDE 0.9 % IV SOLN
200.0000 mg | Freq: Once | INTRAVENOUS | Status: AC
  Administered 2015-09-28: 200 mg via INTRAVENOUS
  Filled 2015-09-28: qty 8

## 2015-09-28 MED ORDER — METHYLPREDNISOLONE SODIUM SUCC 125 MG IJ SOLR
INTRAMUSCULAR | Status: AC
  Filled 2015-09-28: qty 2

## 2015-09-28 MED ORDER — PREDNISONE 10 MG PO TABS: 10.0000 mg | ORAL_TABLET | Freq: Every day | ORAL | Status: DC

## 2015-09-28 MED ORDER — METHYLPREDNISOLONE SODIUM SUCC 40 MG IJ SOLR
40.0000 mg | Freq: Once | INTRAMUSCULAR | Status: AC
  Administered 2015-09-28: 40 mg via INTRAVENOUS

## 2015-09-28 MED ORDER — LIDOCAINE-PRILOCAINE 2.5-2.5 % EX CREA: 1.0000 "application " | TOPICAL_CREAM | CUTANEOUS | Status: DC | PRN

## 2015-09-28 MED ORDER — METHYLPREDNISOLONE SODIUM SUCC 40 MG IJ SOLR
INTRAMUSCULAR | Status: AC
  Filled 2015-09-28: qty 1

## 2015-09-28 MED ORDER — SODIUM CHLORIDE 0.9% FLUSH
10.0000 mL | INTRAVENOUS | Status: DC | PRN
  Administered 2015-09-28: 10 mL via INTRAVENOUS
  Filled 2015-09-28: qty 10

## 2015-09-28 MED ORDER — HEPARIN SOD (PORK) LOCK FLUSH 100 UNIT/ML IV SOLN
500.0000 [IU] | Freq: Once | INTRAVENOUS | Status: AC | PRN
  Administered 2015-09-28: 500 [IU]
  Filled 2015-09-28: qty 5

## 2015-09-28 MED ORDER — SODIUM CHLORIDE 0.9% FLUSH
10.0000 mL | INTRAVENOUS | Status: DC | PRN
Start: 2015-09-28 — End: 2015-09-28
  Administered 2015-09-28: 10 mL
  Filled 2015-09-28: qty 10

## 2015-09-28 MED FILL — LIDOCAINE-PRILOCAINE CREAM: 2.5-2.5 | 20 days supply | Qty: 30 | Fill #0

## 2015-09-28 MED FILL — predniSONE 10 MG TABS: 10 | 10 days supply | Qty: 10 | Fill #0

## 2015-09-28 NOTE — Patient Instructions (Signed)
[facility] Discharge Instructions for Patients Receiving Chemotherapy  Today you received the following chemotherapy agents:  Keytruda.  To help prevent nausea and vomiting after your treatment, we encourage you to take your nausea medication as prescribed.   If you develop nausea and vomiting that is not controlled by your nausea medication, call the clinic.   BELOW ARE SYMPTOMS THAT SHOULD BE REPORTED IMMEDIATELY:  *FEVER GREATER THAN 100.5 F  *CHILLS WITH OR WITHOUT FEVER  NAUSEA AND VOMITING THAT IS NOT CONTROLLED WITH YOUR NAUSEA MEDICATION  *UNUSUAL SHORTNESS OF BREATH  *UNUSUAL BRUISING OR BLEEDING  TENDERNESS IN MOUTH AND THROAT WITH OR WITHOUT PRESENCE OF ULCERS  *URINARY PROBLEMS  *BOWEL PROBLEMS  UNUSUAL RASH Items with * indicate a potential emergency and should be followed up as soon as possible.  Feel free to call the clinic you have any questions or concerns. The clinic phone number is [phone].  Please show the CHEMO ALERT CARD at check-in to the Emergency Department and triage nurse.   

## 2015-09-28 NOTE — Assessment & Plan Note (Signed)
The patient has cancer associated pain. He is currently taking 20 mg of oxycodone, increased from 10 mg due to poor response to therapy. We discussed long-acting pain medicine but the patient would like to avoid that for now.

## 2015-09-28 NOTE — Assessment & Plan Note (Signed)
His kidney function is stable. As long as his creatinine clearance remain greater than 30, we will continue chemotherapy Due to elevated creatinine, I would not order IV contrast with CT imaging.

## 2015-09-28 NOTE — Assessment & Plan Note (Signed)
He has lymph node swelling on the right side of the face which is associated with known metastatic site. I suspect a mild pain is due to soft tissue swelling which is not unexpected side effects. He will continue his pain medicine for now but in addition I will also write him for short course prednisone prescription to help with edema/inflammation at that site. Clinically, he does not look like he has evidence of infection

## 2015-09-28 NOTE — Progress Notes (Signed)
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 FNA of right level III lymph node was inconclusive for cancer   07/25/2013 Imaging PET scan showed locally advanced disease with abnormal lymphadenopathy in the right axilla   08/06/2013 Procedure CT-guided biopsy of the lymphadenopathy was negative for malignancy   08/15/2013 Surgery Patient has placement of port and feeding tube   08/19/2013 - 09/10/2013 Chemotherapy Patient received chemotherapy with cisplatin. The patient only received 2 doses due to uncontrolled nausea and acute renal failure.   08/19/2013 - 10/15/2013 Radiation Therapy Received Helical IMRT Tomotherapy:  Right Tonstil and bilateral neck / 70 Gy in 35 fractions to gross disease, 63 Gy in 35 fractions to high risk nodal echelons, and 56 Gy in 35 fractions to intermediate risk nodal echelons.   08/27/2013 - 08/30/2013 Hospital Admission The patient was admitted to the hospital for uncontrolled nausea vomiting and dehydration.   02/14/2014 Imaging PET/CT scan showed complete response to treatment   03/19/2014 Surgery He had excisional lymph node biopsy from the right neck. Pathology was benign   05/13/2014 Surgery He had removal of Port-A-Cath.   05/22/2014 Imaging Repeat CT scan of the neck show no evidence of disease recurrence.   12/09/2014 Imaging Ct neck without contrast showed persistent abnormalities on the right side of neck, indeterminate   12/25/2014 Imaging PET CT scan showed disease recurrence.   02/10/2015 Procedure He has placement of port   02/13/2015 - 07/13/2015 Chemotherapy He received chemotherapy with carbo/Taxol   04/21/2015 Imaging PET CT scan showed improved disease control   08/04/2015 Imaging PET scan showed persistent disease   08/17/2015 -  Chemotherapy He was started with Keytruda    INTERVAL HISTORY: Please see below for problem oriented charting. He returns for cycle 3 of treatment with his wife. He tolerated last treatment well without any side effects of nausea. His main complaint include significant right facial swelling/pain on the right side of the neck. He has to take more pain medicine as needed. He felt that his pain remained poorly controlled despite increasing the dose of oxycodone He is scared of disease progression and is tearful today. Denies suicidal ideation Denies recent infection. Denies chest pain or shortness breath.  REVIEW OF SYSTEMS:   Constitutional: Denies fevers, chills or abnormal weight loss Eyes: Denies blurriness of vision Ears, nose, mouth, throat, and face: Denies mucositis or sore throat Respiratory: Denies cough, dyspnea or wheezes Cardiovascular: Denies palpitation, chest discomfort or lower extremity swelling Gastrointestinal:  Denies nausea, heartburn or change in bowel habits Skin: Denies abnormal skin rashes Neurological:Denies numbness, tingling or new weaknesses Behavioral/Psych: Mood is stable, no new changes  All other systems were reviewed with the patient and are negative.  I have reviewed the past medical history, past surgical  history, social history and family history with the patient and they are unchanged from previous note.  ALLERGIES:  is allergic to pollen  extract.  MEDICATIONS:  Current Outpatient Prescriptions  Medication Sig Dispense Refill  . fluticasone (FLONASE) 50 MCG/ACT nasal spray Place 2 sprays into both nostrils daily. (Patient taking differently: Place 2 sprays into both nostrils daily as needed for allergies. ) 16 g 2  . Multiple Vitamin (MULTIVITAMIN WITH MINERALS) TABS tablet Take 1 tablet by mouth daily.    Marland Kitchen omeprazole (PRILOSEC) 20 MG capsule Take 1 capsule (20 mg total) by mouth daily. 30 capsule 0  . ondansetron (ZOFRAN ODT) 4 MG disintegrating tablet Take 1 tablet (4 mg total) by mouth every 8 (eight) hours as needed for nausea or vomiting. 10 tablet 0  . ondansetron (ZOFRAN) 8 MG tablet Take 1 tablet (8 mg total) by mouth every 8 (eight) hours as needed for nausea or vomiting. 90 tablet 1  . Oxycodone HCl 10 MG TABS Take 2 tablets (20 mg total) by mouth every 4 (four) hours as needed. 90 tablet 0  . prochlorperazine (COMPAZINE) 10 MG tablet Take 1 tablet (10 mg total) by mouth every 6 (six) hours as needed (Nausea or vomiting). 60 tablet 1  . traZODone (DESYREL) 100 MG tablet Take 1 tablet (100 mg total) by mouth at bedtime. 30 tablet 6  . lidocaine-prilocaine (EMLA) cream Apply 1 application topically as needed. 30 g 6  . predniSONE (DELTASONE) 10 MG tablet Take 1 tablet (10 mg total) by mouth daily with breakfast. 10 tablet 0   No current facility-administered medications for this visit.   Facility-Administered Medications Ordered in Other Visits  Medication Dose Route Frequency Provider Last Rate Last Dose  . sodium chloride flush (NS) 0.9 % injection 10 mL  10 mL Intracatheter PRN [name], MD   10 mL at 09/28/15 1230    PHYSICAL EXAMINATION: ECOG PERFORMANCE STATUS: 1 - Symptomatic but completely ambulatory  Filed Vitals:   09/28/15 1054  BP: 121/77  Pulse: 78  Temp: 98.4 F (36.9 C)  Resp: 18   Filed Weights   09/28/15 1054  Weight: 249 lb 6.4 oz (113.127 kg)    GENERAL:alert, no distress and  comfortable SKIN: skin color, texture, turgor are normal, no rashes or significant lesions EYES: normal, Conjunctiva are pink and non-injected, sclera clear OROPHARYNX:no exudate, no erythema and lips, buccal mucosa, and tongue normal  NECK: supple,Well-healed surgical scar, mild facial edema on the right side LYMPH:  He has persistent palpable soft tissue swelling at the cervical lymph node on the right side LUNGS: clear to auscultation and percussion with normal breathing effort HEART: regular rate & rhythm and no murmurs and no lower extremity edema ABDOMEN:abdomen soft, non-tender and normal bowel sounds Musculoskeletal:no cyanosis of digits and no clubbing  NEURO: alert & oriented x 3 with fluent speech, no focal motor/sensory deficits  LABORATORY DATA:  I have reviewed the data as listed    Component Value Date/Time   NA 140 09/28/2015 1021   NA 139 07/23/2015 1513   K 4.4 09/28/2015 1021   K 4.3 07/23/2015 1513   CL 107 07/23/2015 1513   CO2 25 09/28/2015 1021   CO2 26 07/23/2015 1513   GLUCOSE 84 09/28/2015 1021   GLUCOSE 88 07/23/2015 1513   BUN 17.9 09/28/2015 1021   BUN 19 07/23/2015 1513   CREATININE 1.6* 09/28/2015 1021   CREATININE 1.76* 07/23/2015 1513   CALCIUM 9.4 09/28/2015 1021   CALCIUM  8.9 07/23/2015 1513   PROT 7.4 09/28/2015 1021   PROT 7.8 06/13/2015 1849   ALBUMIN 4.0 09/28/2015 1021   ALBUMIN 4.4 06/13/2015 1849   AST 23 09/28/2015 1021   AST 25 06/13/2015 1849   ALT 16 09/28/2015 1021   ALT 20 06/13/2015 1849   ALKPHOS 57 09/28/2015 1021   ALKPHOS 60 06/13/2015 1849   BILITOT 0.40 09/28/2015 1021   BILITOT 0.6 06/13/2015 1849   GFRNONAA 44* 07/23/2015 1513   GFRAA 51* 07/23/2015 1513    No results found for: SPEP, UPEP  Lab Results  Component Value Date   WBC 7.1 09/28/2015   NEUTROABS 5.8 09/28/2015   HGB 12.7* 09/28/2015   HCT 39.0 09/28/2015   MCV 98.5* 09/28/2015   PLT 208 09/28/2015      Chemistry      Component Value  Date/Time   NA 140 09/28/2015 1021   NA 139 07/23/2015 1513   K 4.4 09/28/2015 1021   K 4.3 07/23/2015 1513   CL 107 07/23/2015 1513   CO2 25 09/28/2015 1021   CO2 26 07/23/2015 1513   BUN 17.9 09/28/2015 1021   BUN 19 07/23/2015 1513   CREATININE 1.6* 09/28/2015 1021   CREATININE 1.76* 07/23/2015 1513      Component Value Date/Time   CALCIUM 9.4 09/28/2015 1021   CALCIUM 8.9 07/23/2015 1513   ALKPHOS 57 09/28/2015 1021   ALKPHOS 60 06/13/2015 1849   AST 23 09/28/2015 1021   AST 25 06/13/2015 1849   ALT 16 09/28/2015 1021   ALT 20 06/13/2015 1849   BILITOT 0.40 09/28/2015 1021   BILITOT 0.6 06/13/2015 1849      ASSESSMENT & PLAN:  Tonsillar cancer The patient described an event that could actually let me to believe that he has positive response to treatment. I reassured the patient. Plan to continue treatment as scheduled and I will order a CT scan of the neck and the chest without contrast for assessment of response to treatment. 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 He is currently taking 20 mg of oxycodone, increased from 10 mg due to poor response to therapy. We discussed long-acting pain medicine but the patient would like to avoid that for now.  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 I suspect a mild pain is due to soft tissue swelling which is not unexpected side effects. He will continue his pain medicine for now but in addition I will also write him for short course prednisone prescription to help with edema/inflammation at that site. Clinically, he does not look like he has evidence of infection  Chronic kidney disease (CKD), stage II (mild) His kidney function is stable. As long as his creatinine clearance remain greater than 30, we will continue chemotherapy Due  to elevated creatinine, I would not order IV contrast with CT imaging.      Orders Placed This Encounter  Procedures  . CT Soft Tissue Neck Wo Contrast    Standing Status: Future     Number of Occurrences:      Standing Expiration Date: 12/28/2016    Order Specific Question:  Reason for Exam (SYMPTOM  OR DIAGNOSIS REQUIRED)    Answer:  tonsil ca, assess response to Rx    Order Specific Question:  Preferred imaging location?    Answer:  [facility]  . CT Chest Wo Contrast    Standing Status: Future     Number of Occurrences:      Standing Expiration Date: 11/27/2016    Order Specific Question:  Reason for Exam (SYMPTOM  OR DIAGNOSIS REQUIRED)    Answer:  tonsil ca, assess response to Rx    Order Specific Question:  Preferred imaging location?    Answer:  [facility]   All questions were answered. The patient knows to call the clinic with any problems, questions or concerns. No barriers to learning was detected. I spent 25 minutes counseling the patient face to face. The total time spent in the appointment was 40 minutes and more than 50% was on counseling and review of test results     [facility], [place], MD 09/28/2015 1:29 PM

## 2015-09-28 NOTE — Assessment & Plan Note (Signed)
The patient described an event that could actually let me to believe that he has positive response to treatment. I reassured the patient. Plan to continue treatment as scheduled and I will order a CT scan of the neck and the chest without contrast for assessment of response to treatment. His insurance company previously had repeatedly denied PET/CT scan

## 2015-09-28 NOTE — Telephone Encounter (Signed)
Informed pt of EMLA and Prednisone was rx to [facility] outpatient pharmacy.  He says he is aware and is going to pick up today after lunch.

## 2015-09-29 ENCOUNTER — Telehealth: Payer: Self-pay | Admitting: Hematology and Oncology

## 2015-09-29 NOTE — Telephone Encounter (Signed)
s.w. pt and advised on June appt....pt ok and aware °

## 2015-10-01 ENCOUNTER — Encounter: Payer: Self-pay | Admitting: *Deleted

## 2015-10-01 ENCOUNTER — Other Ambulatory Visit: Payer: Self-pay | Admitting: *Deleted

## 2015-10-01 MED ORDER — OXYCODONE HCL 10 MG PO TABS: 20.0000 mg | ORAL_TABLET | ORAL | Status: DC | PRN

## 2015-10-01 MED FILL — oxyCODONE HCL 10 MG TABS: 10 | 7 days supply | Qty: 90 | Fill #0

## 2015-10-01 NOTE — Progress Notes (Signed)
Signed Refill script for oxycodone #90 given to patient.

## 2015-10-14 ENCOUNTER — Telehealth: Payer: Self-pay | Admitting: *Deleted

## 2015-10-14 ENCOUNTER — Other Ambulatory Visit: Payer: Self-pay | Admitting: *Deleted

## 2015-10-14 MED ORDER — OXYCODONE HCL 10 MG PO TABS: 20.0000 mg | ORAL_TABLET | ORAL | Status: DC | PRN

## 2015-10-14 MED FILL — oxyCODONE HCL 10 MG TABS: 10 | 8 days supply | Qty: 90 | Fill #0

## 2015-10-14 NOTE — Telephone Encounter (Signed)
Pt lvm requesting refill on oxycodone.   LVM for pt informing him of Rx ready to pick up at our office.

## 2015-10-14 NOTE — Telephone Encounter (Signed)
I'm calling for Dr. Alvy Bimler nurse to get a hard copy of refill for my pharmacy."  Voicemail transferred.

## 2015-10-15 ENCOUNTER — Other Ambulatory Visit (HOSPITAL_BASED_OUTPATIENT_CLINIC_OR_DEPARTMENT_OTHER): Payer: Medicaid Other

## 2015-10-15 ENCOUNTER — Other Ambulatory Visit: Payer: Self-pay | Admitting: Hematology and Oncology

## 2015-10-15 ENCOUNTER — Other Ambulatory Visit: Payer: Self-pay | Admitting: *Deleted

## 2015-10-15 ENCOUNTER — Telehealth: Payer: Self-pay | Admitting: Hematology and Oncology

## 2015-10-15 DIAGNOSIS — C099 Malignant neoplasm of tonsil, unspecified: Secondary | ICD-10-CM | POA: Diagnosis present

## 2015-10-15 DIAGNOSIS — Z79899 Other long term (current) drug therapy: Secondary | ICD-10-CM

## 2015-10-15 LAB — COMPREHENSIVE METABOLIC PANEL
ALBUMIN: 4 g/dL (ref 3.5–5.0)
ALK PHOS: 64 U/L (ref 40–150)
ALT: 14 U/L (ref 0–55)
AST: 21 U/L (ref 5–34)
Anion Gap: 7 mEq/L (ref 3–11)
BUN: 12.6 mg/dL (ref 7.0–26.0)
CALCIUM: 9.7 mg/dL (ref 8.4–10.4)
CHLORIDE: 105 meq/L (ref 98–109)
CO2: 30 mEq/L — ABNORMAL HIGH (ref 22–29)
Creatinine: 1.7 mg/dL — ABNORMAL HIGH (ref 0.7–1.3)
EGFR: 55 mL/min/{1.73_m2} — AB (ref >90)
Glucose: 90 mg/dl (ref 70–140)
POTASSIUM: 4.7 meq/L (ref 3.5–5.1)
SODIUM: 142 meq/L (ref 136–145)
Total Bilirubin: 0.34 mg/dL (ref 0.20–1.20)
Total Protein: 7.7 g/dL (ref 6.4–8.3)

## 2015-10-15 LAB — TSH: TSH: 4.008 m(IU)/L (ref 0.320–4.118)

## 2015-10-15 LAB — CBC WITH DIFFERENTIAL/PLATELET
BASO%: 0.5 % (ref 0.0–2.0)
BASOS ABS: 0 10*3/uL (ref 0.0–0.1)
EOS ABS: 0.2 10*3/uL (ref 0.0–0.5)
EOS%: 3.1 % (ref 0.0–7.0)
HEMATOCRIT: 39.8 % (ref 38.4–49.9)
HGB: 12.7 g/dL — ABNORMAL LOW (ref 13.0–17.1)
LYMPH%: 9.7 % — ABNORMAL LOW (ref 14.0–49.0)
MCH: 30.8 pg (ref 27.2–33.4)
MCHC: 32 g/dL (ref 32.0–36.0)
MCV: 96.3 fL (ref 79.3–98.0)
MONO#: 0.6 10*3/uL (ref 0.1–0.9)
MONO%: 8.1 % (ref 0.0–14.0)
NEUT#: 5.6 10*3/uL (ref 1.5–6.5)
NEUT%: 78.6 % — AB (ref 39.0–75.0)
Platelets: 210 10*3/uL (ref 140–400)
RBC: 4.13 10*6/uL — ABNORMAL LOW (ref 4.20–5.82)
RDW: 14 % (ref 11.0–14.6)
WBC: 7.1 10*3/uL (ref 4.0–10.3)
lymph#: 0.7 10*3/uL — ABNORMAL LOW (ref 0.9–3.3)

## 2015-10-15 NOTE — Telephone Encounter (Signed)
s.w. pt and advised on July appt....pt ok and aware °

## 2015-10-16 ENCOUNTER — Ambulatory Visit: Payer: Self-pay

## 2015-10-16 ENCOUNTER — Ambulatory Visit: Payer: Self-pay | Admitting: Hematology and Oncology

## 2015-10-19 ENCOUNTER — Ambulatory Visit (HOSPITAL_COMMUNITY)
Admission: RE | Admit: 2015-10-19 | Discharge: 2015-10-19 | Disposition: A | Discharge status: Home / Self Care | Payer: Medicaid Other | Source: Ambulatory Visit | Attending: Hematology and Oncology | Admitting: Hematology and Oncology

## 2015-10-19 ENCOUNTER — Other Ambulatory Visit: Payer: Self-pay | Admitting: Hematology and Oncology

## 2015-10-19 ENCOUNTER — Ambulatory Visit (HOSPITAL_COMMUNITY): Payer: Medicaid Other

## 2015-10-19 ENCOUNTER — Encounter (HOSPITAL_COMMUNITY): Payer: Self-pay

## 2015-10-19 DIAGNOSIS — J984 Other disorders of lung: Secondary | ICD-10-CM | POA: Insufficient documentation

## 2015-10-19 DIAGNOSIS — J439 Emphysema, unspecified: Secondary | ICD-10-CM | POA: Insufficient documentation

## 2015-10-19 DIAGNOSIS — R221 Localized swelling, mass and lump, neck: Secondary | ICD-10-CM | POA: Diagnosis not present

## 2015-10-19 DIAGNOSIS — C099 Malignant neoplasm of tonsil, unspecified: Secondary | ICD-10-CM

## 2015-10-22 ENCOUNTER — Ambulatory Visit (HOSPITAL_COMMUNITY): Payer: Medicaid Other

## 2015-10-23 ENCOUNTER — Ambulatory Visit (HOSPITAL_BASED_OUTPATIENT_CLINIC_OR_DEPARTMENT_OTHER): Payer: Medicaid Other | Admitting: Hematology and Oncology

## 2015-10-23 ENCOUNTER — Ambulatory Visit: Payer: Self-pay | Admitting: Hematology and Oncology

## 2015-10-23 ENCOUNTER — Ambulatory Visit (HOSPITAL_BASED_OUTPATIENT_CLINIC_OR_DEPARTMENT_OTHER): Payer: Medicaid Other

## 2015-10-23 ENCOUNTER — Encounter: Payer: Self-pay | Admitting: Hematology and Oncology

## 2015-10-23 ENCOUNTER — Other Ambulatory Visit: Payer: Self-pay | Admitting: *Deleted

## 2015-10-23 VITALS — BP 134/86 | HR 84 | Temp 98.0°F | Resp 18 | Ht 79.0 in | Wt 250.7 lb

## 2015-10-23 DIAGNOSIS — Z5112 Encounter for antineoplastic immunotherapy: Secondary | ICD-10-CM

## 2015-10-23 DIAGNOSIS — D630 Anemia in neoplastic disease: Secondary | ICD-10-CM

## 2015-10-23 DIAGNOSIS — N182 Chronic kidney disease, stage 2 (mild): Secondary | ICD-10-CM | POA: Diagnosis not present

## 2015-10-23 DIAGNOSIS — C099 Malignant neoplasm of tonsil, unspecified: Secondary | ICD-10-CM | POA: Diagnosis present

## 2015-10-23 DIAGNOSIS — G893 Neoplasm related pain (acute) (chronic): Secondary | ICD-10-CM | POA: Diagnosis not present

## 2015-10-23 MED ORDER — SODIUM CHLORIDE 0.9 % IV SOLN
Freq: Once | INTRAVENOUS | Status: AC
  Administered 2015-10-23: 13:00:00 via INTRAVENOUS

## 2015-10-23 MED ORDER — OXYCODONE HCL 15 MG PO TABS: 15.0000 mg | ORAL_TABLET | ORAL | Status: DC | PRN

## 2015-10-23 MED ORDER — SODIUM CHLORIDE 0.9 % IV SOLN
200.0000 mg | Freq: Once | INTRAVENOUS | Status: AC
  Administered 2015-10-23: 200 mg via INTRAVENOUS
  Filled 2015-10-23: qty 8

## 2015-10-23 MED ORDER — HEPARIN SOD (PORK) LOCK FLUSH 100 UNIT/ML IV SOLN
500.0000 [IU] | Freq: Once | INTRAVENOUS | Status: AC | PRN
  Administered 2015-10-23: 500 [IU]
  Filled 2015-10-23: qty 5

## 2015-10-23 MED ORDER — SODIUM CHLORIDE 0.9% FLUSH
10.0000 mL | INTRAVENOUS | Status: DC | PRN
  Administered 2015-10-23: 10 mL
  Filled 2015-10-23: qty 10

## 2015-10-23 MED FILL — oxyCODONE HCL 15 MG TABS: 15 | 15 days supply | Qty: 90 | Fill #0

## 2015-10-23 NOTE — Assessment & Plan Note (Signed)
His kidney function is stable. As long as his creatinine clearance remain greater than 30, we will continue chemotherapy

## 2015-10-23 NOTE — Progress Notes (Signed)
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 FNA of right level III lymph node was inconclusive for cancer   07/25/2013 Imaging PET scan showed locally advanced disease with abnormal lymphadenopathy in the right axilla   08/06/2013 Procedure CT-guided biopsy of the lymphadenopathy was negative for malignancy   08/15/2013 Surgery Patient has placement of port and feeding tube   08/19/2013 - 09/10/2013 Chemotherapy Patient received chemotherapy with cisplatin. The patient only received 2 doses due to uncontrolled nausea and acute renal failure.   08/19/2013 - 10/15/2013 Radiation Therapy Received Helical IMRT Tomotherapy:  Right Tonstil and bilateral neck / 70 Gy in 35 fractions to gross disease, 63 Gy in 35 fractions to high risk nodal echelons, and 56 Gy in 35 fractions to intermediate risk nodal echelons.   08/27/2013 - 08/30/2013 Hospital Admission The patient was admitted to the hospital for uncontrolled nausea vomiting and dehydration.   02/14/2014 Imaging PET/CT scan showed complete response to treatment   03/19/2014 Surgery He had excisional lymph node biopsy from the right neck. Pathology was benign   05/13/2014 Surgery He had removal of Port-A-Cath.   05/22/2014 Imaging Repeat CT scan of the neck show no evidence of disease recurrence.   12/09/2014 Imaging Ct neck without contrast showed persistent abnormalities on the right side of neck, indeterminate   12/25/2014 Imaging PET CT scan showed disease recurrence.   02/10/2015 Procedure He has placement of port   02/13/2015 - 07/13/2015 Chemotherapy He received chemotherapy with carbo/Taxol   04/21/2015 Imaging PET CT scan showed improved disease control   08/04/2015 Imaging PET scan showed persistent disease   08/17/2015 -  Chemotherapy He was started with Keytruda    INTERVAL HISTORY: Please see below for problem oriented charting. He returns to review test results. He has persistent neck pain that is intermittent in nature. He denies nausea or constipation. No worsening neuropathy. Denies recent infection. He noted a stitch coming out on the right side of his neck  REVIEW OF SYSTEMS:   Constitutional: Denies fevers, chills or abnormal weight loss Eyes: Denies blurriness of vision Ears, nose, mouth, throat, and face: Denies mucositis or sore throat Respiratory: Denies cough, dyspnea or wheezes Cardiovascular: Denies palpitation, chest discomfort or lower extremity swelling Gastrointestinal:  Denies nausea, heartburn or change in bowel habits Skin: Denies abnormal skin rashes Lymphatics: Denies new lymphadenopathy or easy bruising Neurological:Denies numbness, tingling or new weaknesses Behavioral/Psych: Mood is stable, no new changes  All other systems were reviewed with the patient and are negative.  I have reviewed the past medical history, past surgical history, social history and family history with the patient and they are unchanged from previous note.  ALLERGIES:  is allergic to pollen extract.  MEDICATIONS:  Current Outpatient Prescriptions  Medication Sig Dispense  Refill  . fluticasone (FLONASE) 50 MCG/ACT nasal spray Place 2 sprays into both nostrils daily. (Patient taking  differently: Place 2 sprays into both nostrils daily as needed for allergies. ) 16 g 2  . lidocaine-prilocaine (EMLA) cream Apply 1 application topically as needed. 30 g 6  . Multiple Vitamin (MULTIVITAMIN WITH MINERALS) TABS tablet Take 1 tablet by mouth daily.    Marland Kitchen omeprazole (PRILOSEC) 20 MG capsule Take 1 capsule (20 mg total) by mouth daily. 30 capsule 0  . traZODone (DESYREL) 100 MG tablet Take 1 tablet (100 mg total) by mouth at bedtime. 30 tablet 6  . ondansetron (ZOFRAN ODT) 4 MG disintegrating tablet Take 1 tablet (4 mg total) by mouth every 8 (eight) hours as needed for nausea or vomiting. (Patient not taking: Reported on 10/23/2015) 10 tablet 0  . ondansetron (ZOFRAN) 8 MG tablet Take 1 tablet (8 mg total) by mouth every 8 (eight) hours as needed for nausea or vomiting. (Patient not taking: Reported on 10/23/2015) 90 tablet 1  . oxyCODONE (ROXICODONE) 15 MG immediate release tablet Take 1 tablet (15 mg total) by mouth every 4 (four) hours as needed for severe pain. 90 tablet 0  . prochlorperazine (COMPAZINE) 10 MG tablet Take 1 tablet (10 mg total) by mouth every 6 (six) hours as needed (Nausea or vomiting). (Patient not taking: Reported on 10/23/2015) 60 tablet 1   No current facility-administered medications for this visit.   Facility-Administered Medications Ordered in Other Visits  Medication Dose Route Frequency Provider Last Rate Last Dose  . sodium chloride flush (NS) 0.9 % injection 10 mL  10 mL Intracatheter PRN [name], MD   10 mL at 10/23/15 1406    PHYSICAL EXAMINATION: ECOG PERFORMANCE STATUS: 1 - Symptomatic but completely ambulatory  Filed Vitals:   10/23/15 1209  BP: 134/86  Pulse: 84  Temp: 98 F (36.7 C)  Resp: 18   Filed Weights   10/23/15 1209  Weight: 250 lb 11.2 oz (113.717 kg)    GENERAL:alert, no distress and comfortable SKIN: skin color, texture, turgor are normal, no rashes or significant lesions EYES: normal, Conjunctiva are pink and  non-injected, sclera clear OROPHARYNX:no exudate, no erythema and lips, buccal mucosa, and tongue normal  NECK: I noted a stitch on the right side of his neck. I removed today. 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  LYMPH:  no palpable lymphadenopathy in the cervical, axillary or inguinal LUNGS: clear to auscultation and percussion with normal breathing effort HEART: regular rate & rhythm and no murmurs and no lower extremity edema ABDOMEN:abdomen soft, non-tender and normal bowel sounds Musculoskeletal:no cyanosis of digits and no clubbing  NEURO: alert & oriented x 3 with fluent speech, no focal motor/sensory deficits  LABORATORY DATA:  I have reviewed the data as listed    Component Value Date/Time   NA 142 10/15/2015 0902   NA 139 07/23/2015 1513   K 4.7 10/15/2015 0902   K 4.3 07/23/2015 1513   CL 107 07/23/2015 1513   CO2 30* 10/15/2015 0902   CO2 26 07/23/2015 1513   GLUCOSE 90 10/15/2015 0902   GLUCOSE 88 07/23/2015 1513   BUN 12.6 10/15/2015 0902   BUN 19 07/23/2015 1513   CREATININE 1.7* 10/15/2015 0902   CREATININE 1.76* 07/23/2015 1513   CALCIUM 9.7 10/15/2015 0902   CALCIUM 8.9 07/23/2015 1513   PROT 7.7 10/15/2015 0902   PROT 7.8 06/13/2015 1849   ALBUMIN 4.0 10/15/2015 0902   ALBUMIN 4.4 06/13/2015 1849  AST 21 10/15/2015 0902   AST 25 06/13/2015 1849   ALT 14 10/15/2015 0902   ALT 20 06/13/2015 1849   ALKPHOS 64 10/15/2015 0902   ALKPHOS 60 06/13/2015 1849   BILITOT 0.34 10/15/2015 0902   BILITOT 0.6 06/13/2015 1849   GFRNONAA 44* 07/23/2015 1513   GFRAA 51* 07/23/2015 1513    No results found for: SPEP, UPEP  Lab Results  Component Value Date   WBC 7.1 10/15/2015   NEUTROABS 5.6 10/15/2015   HGB 12.7* 10/15/2015   HCT 39.8 10/15/2015   MCV 96.3 10/15/2015   PLT 210 10/15/2015      Chemistry      Component Value Date/Time   NA 142 10/15/2015 0902   NA 139 07/23/2015 1513   K 4.7 10/15/2015 0902   K 4.3 07/23/2015 1513   CL 107  07/23/2015 1513   CO2 30* 10/15/2015 0902   CO2 26 07/23/2015 1513   BUN 12.6 10/15/2015 0902   BUN 19 07/23/2015 1513   CREATININE 1.7* 10/15/2015 0902   CREATININE 1.76* 07/23/2015 1513      Component Value Date/Time   CALCIUM 9.7 10/15/2015 0902   CALCIUM 8.9 07/23/2015 1513   ALKPHOS 64 10/15/2015 0902   ALKPHOS 60 06/13/2015 1849   AST 21 10/15/2015 0902   AST 25 06/13/2015 1849   ALT 14 10/15/2015 0902   ALT 20 06/13/2015 1849   BILITOT 0.34 10/15/2015 0902   BILITOT 0.6 06/13/2015 1849       RADIOGRAPHIC STUDIES:I reviewed the CT imaging I have personally reviewed the radiological images as listed and agreed with the findings in the report.    ASSESSMENT & PLAN:  Tonsillar cancer I discussed with the patient the limitations of comparing CT scan with PET CT scan. Overall, he has stable disease with no signs of diffuse metastatic cancer or cancer progression. I recommend we continue same treatment for now and repeat imaging study in 3-4 months in the future  Anemia in neoplastic disease This is likely anemia of chronic disease. The patient denies recent history of bleeding such as epistaxis, hematuria or hematochezia. He is asymptomatic from the anemia. We will observe for now.    Chronic kidney disease (CKD), stage II (mild) His kidney function is stable. As long as his creatinine clearance remain greater than 30, we will continue chemotherapy      Cancer associated pain The patient has cancer associated pain. The patient has a leftover stitch on the right side of his neck. I removed the stitch today. His pain is intermittent in nature and I reassured the patient it is not due to cancer progression I recommend increasing the dose of oxycodone to 15 mg every 4 hours as needed for pain We discussed long-acting pain medicine but the patient would like to avoid that for now. I refill his prescription today   No orders of the defined types were placed in this  encounter.   All questions were answered. The patient knows to call the clinic with any problems, questions or concerns. No barriers to learning was detected. I spent 25 minutes counseling the patient face to face. The total time spent in the appointment was 30 minutes and more than 50% was on counseling and review of test results     [facility], [place], MD 10/23/2015 5:05 PM

## 2015-10-23 NOTE — Assessment & Plan Note (Signed)
The patient has cancer associated pain. The patient has a leftover stitch on the right side of his neck. I removed the stitch today. His pain is intermittent in nature and I reassured the patient it is not due to cancer progression I recommend increasing the dose of oxycodone to 15 mg every 4 hours as needed for pain We discussed long-acting pain medicine but the patient would like to avoid that for now. I refill his prescription today

## 2015-10-23 NOTE — Patient Instructions (Signed)
[facility] Discharge Instructions for Patients Receiving Chemotherapy  Today you received the following chemotherapy agents: Keytruda   To help prevent nausea and vomiting after your treatment, we encourage you to take your nausea medication as directed    If you develop nausea and vomiting that is not controlled by your nausea medication, call the clinic.   BELOW ARE SYMPTOMS THAT SHOULD BE REPORTED IMMEDIATELY:  *FEVER GREATER THAN 100.5 F  *CHILLS WITH OR WITHOUT FEVER  NAUSEA AND VOMITING THAT IS NOT CONTROLLED WITH YOUR NAUSEA MEDICATION  *UNUSUAL SHORTNESS OF BREATH  *UNUSUAL BRUISING OR BLEEDING  TENDERNESS IN MOUTH AND THROAT WITH OR WITHOUT PRESENCE OF ULCERS  *URINARY PROBLEMS  *BOWEL PROBLEMS  UNUSUAL RASH Items with * indicate a potential emergency and should be followed up as soon as possible.  Feel free to call the clinic you have any questions or concerns. The clinic phone number is [phone].  Please show the CHEMO ALERT CARD at check-in to the Emergency Department and triage nurse.   

## 2015-10-23 NOTE — Assessment & Plan Note (Signed)
I discussed with the patient the limitations of comparing CT scan with PET CT scan. Overall, he has stable disease with no signs of diffuse metastatic cancer or cancer progression. I recommend we continue same treatment for now and repeat imaging study in 3-4 months in the future

## 2015-10-23 NOTE — Assessment & Plan Note (Signed)
This is likely anemia of chronic disease. The patient denies recent history of bleeding such as epistaxis, hematuria or hematochezia. He is asymptomatic from the anemia. We will observe for now.  

## 2015-11-05 ENCOUNTER — Other Ambulatory Visit: Payer: Self-pay | Admitting: *Deleted

## 2015-11-05 MED ORDER — OXYCODONE HCL 15 MG PO TABS: 15.0000 mg | ORAL_TABLET | ORAL | Status: DC | PRN

## 2015-11-05 MED FILL — oxyCODONE HCL 15 MG TABS: 15 | 15 days supply | Qty: 90 | Fill #0

## 2015-11-05 NOTE — Telephone Encounter (Signed)
Pt called requested refill on his oxycodone.  Informed him of Rx ready to pick up.

## 2015-11-16 ENCOUNTER — Ambulatory Visit: Payer: Medicaid Other

## 2015-11-16 ENCOUNTER — Ambulatory Visit (HOSPITAL_BASED_OUTPATIENT_CLINIC_OR_DEPARTMENT_OTHER): Payer: Medicaid Other | Admitting: Hematology and Oncology

## 2015-11-16 ENCOUNTER — Other Ambulatory Visit (HOSPITAL_BASED_OUTPATIENT_CLINIC_OR_DEPARTMENT_OTHER): Payer: Medicaid Other

## 2015-11-16 ENCOUNTER — Ambulatory Visit (HOSPITAL_BASED_OUTPATIENT_CLINIC_OR_DEPARTMENT_OTHER): Payer: Medicaid Other

## 2015-11-16 ENCOUNTER — Telehealth: Payer: Self-pay | Admitting: Hematology and Oncology

## 2015-11-16 ENCOUNTER — Encounter: Payer: Self-pay | Admitting: Hematology and Oncology

## 2015-11-16 DIAGNOSIS — S1190XD Unspecified open wound of unspecified part of neck, subsequent encounter: Secondary | ICD-10-CM

## 2015-11-16 DIAGNOSIS — C099 Malignant neoplasm of tonsil, unspecified: Secondary | ICD-10-CM

## 2015-11-16 DIAGNOSIS — D630 Anemia in neoplastic disease: Secondary | ICD-10-CM

## 2015-11-16 DIAGNOSIS — G893 Neoplasm related pain (acute) (chronic): Secondary | ICD-10-CM

## 2015-11-16 DIAGNOSIS — Z79899 Other long term (current) drug therapy: Secondary | ICD-10-CM | POA: Diagnosis not present

## 2015-11-16 DIAGNOSIS — N182 Chronic kidney disease, stage 2 (mild): Secondary | ICD-10-CM

## 2015-11-16 DIAGNOSIS — Z5112 Encounter for antineoplastic immunotherapy: Secondary | ICD-10-CM

## 2015-11-16 DIAGNOSIS — S1190XA Unspecified open wound of unspecified part of neck, initial encounter: Secondary | ICD-10-CM | POA: Insufficient documentation

## 2015-11-16 LAB — COMPREHENSIVE METABOLIC PANEL
ALBUMIN: 3.8 g/dL (ref 3.5–5.0)
ALK PHOS: 69 U/L (ref 40–150)
ALT: 14 U/L (ref 0–55)
AST: 21 U/L (ref 5–34)
Anion Gap: 7 mEq/L (ref 3–11)
BILIRUBIN TOTAL: 0.36 mg/dL (ref 0.20–1.20)
BUN: 18.7 mg/dL (ref 7.0–26.0)
CALCIUM: 9.4 mg/dL (ref 8.4–10.4)
CO2: 27 mEq/L (ref 22–29)
CREATININE: 1.6 mg/dL — AB (ref 0.7–1.3)
Chloride: 106 mEq/L (ref 98–109)
EGFR: 58 mL/min/{1.73_m2} — ABNORMAL LOW (ref >90)
GLUCOSE: 80 mg/dL (ref 70–140)
Potassium: 4.4 mEq/L (ref 3.5–5.1)
SODIUM: 140 meq/L (ref 136–145)
TOTAL PROTEIN: 7.3 g/dL (ref 6.4–8.3)

## 2015-11-16 LAB — CBC WITH DIFFERENTIAL/PLATELET
BASO%: 0.7 % (ref 0.0–2.0)
Basophils Absolute: 0 10*3/uL (ref 0.0–0.1)
EOS ABS: 0.2 10*3/uL (ref 0.0–0.5)
EOS%: 2.8 % (ref 0.0–7.0)
HEMATOCRIT: 36.7 % — AB (ref 38.4–49.9)
HEMOGLOBIN: 12 g/dL — AB (ref 13.0–17.1)
LYMPH#: 0.7 10*3/uL — AB (ref 0.9–3.3)
LYMPH%: 11.4 % — ABNORMAL LOW (ref 14.0–49.0)
MCH: 30.8 pg (ref 27.2–33.4)
MCHC: 32.8 g/dL (ref 32.0–36.0)
MCV: 93.9 fL (ref 79.3–98.0)
MONO#: 0.4 10*3/uL (ref 0.1–0.9)
MONO%: 7.5 % (ref 0.0–14.0)
NEUT%: 77.6 % — ABNORMAL HIGH (ref 39.0–75.0)
NEUTROS ABS: 4.5 10*3/uL (ref 1.5–6.5)
Platelets: 212 10*3/uL (ref 140–400)
RBC: 3.91 10*6/uL — ABNORMAL LOW (ref 4.20–5.82)
RDW: 14.1 % (ref 11.0–14.6)
WBC: 5.8 10*3/uL (ref 4.0–10.3)

## 2015-11-16 LAB — TSH: TSH: 3.525 m(IU)/L (ref 0.320–4.118)

## 2015-11-16 MED ORDER — SODIUM CHLORIDE 0.9 % IV SOLN
Freq: Once | INTRAVENOUS | Status: AC
  Administered 2015-11-16: 13:00:00 via INTRAVENOUS

## 2015-11-16 MED ORDER — SODIUM CHLORIDE 0.9 % IV SOLN
200.0000 mg | Freq: Once | INTRAVENOUS | Status: AC
  Administered 2015-11-16: 200 mg via INTRAVENOUS
  Filled 2015-11-16: qty 8

## 2015-11-16 MED ORDER — HEPARIN SOD (PORK) LOCK FLUSH 100 UNIT/ML IV SOLN
500.0000 [IU] | Freq: Once | INTRAVENOUS | Status: AC | PRN
  Administered 2015-11-16: 500 [IU]
  Filled 2015-11-16: qty 5

## 2015-11-16 MED ORDER — SODIUM CHLORIDE 0.9% FLUSH
10.0000 mL | INTRAVENOUS | Status: DC | PRN
  Administered 2015-11-16: 10 mL
  Filled 2015-11-16: qty 10

## 2015-11-16 MED ORDER — SODIUM CHLORIDE 0.9 % IJ SOLN
10.0000 mL | INTRAMUSCULAR | Status: DC | PRN
  Administered 2015-11-16: 10 mL via INTRAVENOUS
  Filled 2015-11-16: qty 10

## 2015-11-16 NOTE — Assessment & Plan Note (Signed)
Overall, he has stable disease with no signs of diffuse metastatic cancer or cancer progression. I recommend we continue same treatment for now and repeat imaging study in 3-4 months in the future

## 2015-11-16 NOTE — Assessment & Plan Note (Signed)
The patient has cancer associated pain. His pain is intermittent in nature and I reassured the patient it is not due to cancer progression I recommend increasing the dose of oxycodone to 15 mg every 4 hours as needed for pain We discussed long-acting pain medicine but the patient would like to avoid that for now.

## 2015-11-16 NOTE — Telephone Encounter (Signed)
per pof to sch pt appt-gave pt copy of avs °

## 2015-11-16 NOTE — Progress Notes (Signed)
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 FNA of right level III lymph node was inconclusive for cancer     07/25/2013 Imaging    PET scan showed locally advanced disease with abnormal lymphadenopathy in the right axilla     08/06/2013 Procedure    CT-guided biopsy of the lymphadenopathy was negative for malignancy     08/15/2013 Surgery    Patient has placement of port and feeding tube     08/19/2013 - 09/10/2013 Chemotherapy    Patient received chemotherapy with cisplatin. The patient only received 2 doses due to uncontrolled nausea and acute renal failure.     08/19/2013 - 10/15/2013 Radiation Therapy    Received Helical IMRT Tomotherapy:  Right Tonstil and bilateral neck / 70 Gy in 35 fractions to gross disease, 63 Gy in 35 fractions to high risk nodal echelons, and 56 Gy in 35 fractions to intermediate risk nodal echelons.     08/27/2013 - 08/30/2013 Hospital Admission    The patient was admitted to the hospital for uncontrolled nausea vomiting and dehydration.     02/14/2014 Imaging    PET/CT scan showed complete response to treatment     03/19/2014 Surgery    He had excisional lymph node biopsy from the right neck. Pathology was benign      05/13/2014 Surgery    He had removal of Port-A-Cath.     05/22/2014 Imaging    Repeat CT scan of the neck show no evidence of disease recurrence.     12/09/2014 Imaging    Ct neck without contrast showed persistent abnormalities on the right side of neck, indeterminate     12/25/2014 Imaging    PET CT scan showed disease recurrence.     02/10/2015 Procedure    He has placement of port     02/13/2015 - 07/13/2015 Chemotherapy    He received chemotherapy with carbo/Taxol     04/21/2015 Imaging    PET CT scan showed improved disease control     08/04/2015 Imaging    PET scan showed persistent disease     08/17/2015 -  Chemotherapy    He was started with [facility]     10/19/2015 Imaging    Ct neck showed mass-like intermediate density soft tissue at the right lateral neck recurrence site stable.       INTERVAL HISTORY: Please see below for problem oriented charting. He returns for follow-up. He is concerned about mild intermittent discharge from the rise of his neck. No recent fever or chills. His pain is stable with current description oxycodone. No nausea or change in bowel habits  REVIEW OF SYSTEMS:   Constitutional: Denies fevers, chills or abnormal weight loss Eyes: Denies blurriness of vision Ears, nose, mouth,  throat, and face: Denies mucositis or sore throat Respiratory: Denies cough, dyspnea or wheezes Cardiovascular: Denies palpitation, chest discomfort or lower extremity swelling Gastrointestinal:  Denies nausea, heartburn or change in bowel habits Skin: Denies abnormal skin rashes Lymphatics: Denies new lymphadenopathy or easy bruising Neurological:Denies numbness, tingling or new weaknesses Behavioral/Psych: Mood is stable, no new changes  All other systems were reviewed with the patient and are negative.  I have reviewed the past medical history, past surgical history, social history and family history with the patient and they are unchanged from previous  note.  ALLERGIES:  is allergic to pollen extract.  MEDICATIONS:  Current Outpatient Prescriptions  Medication Sig Dispense Refill  . fluticasone (FLONASE) 50 MCG/ACT nasal spray Place 2 sprays into both nostrils daily. (Patient taking differently: Place 2 sprays into both nostrils daily as needed for allergies. ) 16 g 2  . lidocaine-prilocaine (EMLA) cream Apply 1 application topically as needed. 30 g 6  . Multiple Vitamin (MULTIVITAMIN WITH MINERALS) TABS tablet Take 1 tablet by mouth daily.    Marland Kitchen omeprazole (PRILOSEC) 20 MG capsule Take 1 capsule (20 mg total) by mouth daily. 30 capsule 0  . oxyCODONE (ROXICODONE) 15 MG immediate release tablet Take 1 tablet (15 mg total) by mouth every 4 (four) hours as needed. 90 tablet 0  . traZODone (DESYREL) 100 MG tablet Take 1 tablet (100 mg total) by mouth at bedtime. 30 tablet 6  . ondansetron (ZOFRAN ODT) 4 MG disintegrating tablet Take 1 tablet (4 mg total) by mouth every 8 (eight) hours as needed for nausea or vomiting. (Patient not taking: Reported on 10/23/2015) 10 tablet 0  . ondansetron (ZOFRAN) 8 MG tablet Take 1 tablet (8 mg total) by mouth every 8 (eight) hours as needed for nausea or vomiting. (Patient not taking: Reported on 10/23/2015) 90 tablet 1  . prochlorperazine (COMPAZINE) 10 MG tablet Take 1 tablet (10 mg total) by mouth every 6 (six) hours as needed (Nausea or vomiting). (Patient not taking: Reported on 10/23/2015) 60 tablet 1   No current facility-administered medications for this visit.    Facility-Administered Medications Ordered in Other Visits  Medication Dose Route Frequency Provider Last Rate Last Dose  . heparin lock flush 100 unit/mL  500 Units Intracatheter Once PRN [name], MD      . pembrolizumab (KEYTRUDA) 200 mg in sodium chloride 0.9 % 50 mL chemo infusion  200 mg Intravenous Once [name], MD      . sodium chloride flush (NS) 0.9 % injection 10 mL  10 mL Intracatheter PRN [name], MD        PHYSICAL  EXAMINATION: ECOG PERFORMANCE STATUS: 1 - Symptomatic but completely ambulatory  Vitals:   11/16/15 1147  BP: (!) 142/86  Pulse: 65  Resp: 20  Temp: 98.4 F (36.9 C)   Filed Weights   11/16/15 1147  Weight: 254 lb 8 oz (115.4 kg)    GENERAL:alert, no distress and comfortable SKIN: skin color, texture, turgor are normal, no rashes or significant lesions EYES: normal, Conjunctiva are pink and non-injected, sclera clear OROPHARYNX:no exudate, no erythema and lips, buccal mucosa, and tongue normal  NECK:He has an open wound on the right side of his neck. It does not look infected. I placed clean dressing over it LYMPH:  no palpable lymphadenopathy in the cervical, axillary or inguinal LUNGS: clear to auscultation and percussion with normal breathing effort HEART: regular rate & rhythm and no murmurs and no lower extremity edema ABDOMEN:abdomen soft, non-tender  and normal bowel sounds Musculoskeletal:no cyanosis of digits and no clubbing  NEURO: alert & oriented x 3 with fluent speech, no focal motor/sensory deficits  LABORATORY DATA:  I have reviewed the data as listed    Component Value Date/Time   NA 140 11/16/2015 1112   K 4.4 11/16/2015 1112   CL 107 07/23/2015 1513   CO2 27 11/16/2015 1112   GLUCOSE 80 11/16/2015 1112   BUN 18.7 11/16/2015 1112   CREATININE 1.6 (H) 11/16/2015 1112   CALCIUM 9.4 11/16/2015 1112   PROT 7.3 11/16/2015 1112   ALBUMIN 3.8 11/16/2015 1112   AST 21 11/16/2015 1112   ALT 14 11/16/2015 1112   ALKPHOS 69 11/16/2015 1112   BILITOT 0.36 11/16/2015 1112   GFRNONAA 44 (L) 07/23/2015 1513   GFRAA 51 (L) 07/23/2015 1513    No results found for: SPEP, UPEP  Lab Results  Component Value Date   WBC 5.8 11/16/2015   NEUTROABS 4.5 11/16/2015   HGB 12.0 (L) 11/16/2015   HCT 36.7 (L) 11/16/2015   MCV 93.9 11/16/2015   PLT 212 11/16/2015      Chemistry      Component Value Date/Time   NA 140 11/16/2015 1112   K 4.4 11/16/2015 1112   CL 107  07/23/2015 1513   CO2 27 11/16/2015 1112   BUN 18.7 11/16/2015 1112   CREATININE 1.6 (H) 11/16/2015 1112      Component Value Date/Time   CALCIUM 9.4 11/16/2015 1112   ALKPHOS 69 11/16/2015 1112   AST 21 11/16/2015 1112   ALT 14 11/16/2015 1112   BILITOT 0.36 11/16/2015 1112       ASSESSMENT & PLAN:  Tonsillar cancer Overall, he has stable disease with no signs of diffuse metastatic cancer or cancer progression. I recommend we continue same treatment for now and repeat imaging study in 3-4 months in the future  Anemia in neoplastic disease This is likely anemia of chronic disease. The patient denies recent history of bleeding such as epistaxis, hematuria or hematochezia. He is asymptomatic from the anemia. We will observe for now.    Chronic kidney disease (CKD), stage II (mild) His kidney function is stable. 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 His pain is intermittent in nature and I reassured the patient it is not due to cancer progression I recommend increasing the dose of oxycodone to 15 mg every 4 hours as needed for pain We discussed long-acting pain medicine but the patient would like to avoid that for now.  Open neck wound The patient has an open wound. It does not look infected I put on the dressing over it. This is due to poor wound healing related to prior surgery and radiation. I reassured the patient   No orders of the defined types were placed in this encounter.  All questions were answered. The patient knows to call the clinic with any problems, questions or concerns. No barriers to learning was detected. I spent 20 minutes counseling the patient face to face. The total time spent in the appointment was 25 minutes and more than 50% was on counseling and review of test results     [facility], Monicka Cyran, MD 11/16/2015 1:30 PM

## 2015-11-16 NOTE — Assessment & Plan Note (Signed)
His kidney function is stable. As long as his creatinine clearance remain greater than 30, we will continue chemotherapy

## 2015-11-16 NOTE — Patient Instructions (Signed)
[facility] Discharge Instructions for Patients Receiving Chemotherapy  Today you received the following chemotherapy agents: Keytruda   To help prevent nausea and vomiting after your treatment, we encourage you to take your nausea medication as directed    If you develop nausea and vomiting that is not controlled by your nausea medication, call the clinic.   BELOW ARE SYMPTOMS THAT SHOULD BE REPORTED IMMEDIATELY:  *FEVER GREATER THAN 100.5 F  *CHILLS WITH OR WITHOUT FEVER  NAUSEA AND VOMITING THAT IS NOT CONTROLLED WITH YOUR NAUSEA MEDICATION  *UNUSUAL SHORTNESS OF BREATH  *UNUSUAL BRUISING OR BLEEDING  TENDERNESS IN MOUTH AND THROAT WITH OR WITHOUT PRESENCE OF ULCERS  *URINARY PROBLEMS  *BOWEL PROBLEMS  UNUSUAL RASH Items with * indicate a potential emergency and should be followed up as soon as possible.  Feel free to call the clinic you have any questions or concerns. The clinic phone number is [phone].  Please show the CHEMO ALERT CARD at check-in to the Emergency Department and triage nurse.   

## 2015-11-16 NOTE — Assessment & Plan Note (Signed)
The patient has an open wound. It does not look infected I put on the dressing over it. This is due to poor wound healing related to prior surgery and radiation. I reassured the patient

## 2015-11-16 NOTE — Assessment & Plan Note (Signed)
This is likely anemia of chronic disease. The patient denies recent history of bleeding such as epistaxis, hematuria or hematochezia. He is asymptomatic from the anemia. We will observe for now.  

## 2015-11-19 ENCOUNTER — Other Ambulatory Visit: Payer: Self-pay | Admitting: *Deleted

## 2015-11-19 MED ORDER — OXYCODONE HCL 15 MG PO TABS: 15.0000 mg | ORAL_TABLET | ORAL | 0 refills | Status: DC | PRN

## 2015-11-20 MED FILL — oxyCODONE HCL 15 MG TABS: 15 | 15 days supply | Qty: 90 | Fill #0

## 2015-12-07 ENCOUNTER — Ambulatory Visit (HOSPITAL_BASED_OUTPATIENT_CLINIC_OR_DEPARTMENT_OTHER): Payer: Medicaid Other | Admitting: Hematology and Oncology

## 2015-12-07 ENCOUNTER — Other Ambulatory Visit (HOSPITAL_BASED_OUTPATIENT_CLINIC_OR_DEPARTMENT_OTHER): Payer: Medicaid Other

## 2015-12-07 ENCOUNTER — Ambulatory Visit: Payer: Medicaid Other

## 2015-12-07 ENCOUNTER — Encounter: Payer: Self-pay | Admitting: Hematology and Oncology

## 2015-12-07 ENCOUNTER — Ambulatory Visit (HOSPITAL_BASED_OUTPATIENT_CLINIC_OR_DEPARTMENT_OTHER): Payer: Medicaid Other

## 2015-12-07 DIAGNOSIS — N182 Chronic kidney disease, stage 2 (mild): Secondary | ICD-10-CM

## 2015-12-07 DIAGNOSIS — C099 Malignant neoplasm of tonsil, unspecified: Secondary | ICD-10-CM | POA: Diagnosis present

## 2015-12-07 DIAGNOSIS — D630 Anemia in neoplastic disease: Secondary | ICD-10-CM | POA: Diagnosis not present

## 2015-12-07 DIAGNOSIS — G893 Neoplasm related pain (acute) (chronic): Secondary | ICD-10-CM

## 2015-12-07 DIAGNOSIS — Z79899 Other long term (current) drug therapy: Secondary | ICD-10-CM | POA: Diagnosis not present

## 2015-12-07 DIAGNOSIS — Z5112 Encounter for antineoplastic immunotherapy: Secondary | ICD-10-CM | POA: Diagnosis not present

## 2015-12-07 LAB — CBC WITH DIFFERENTIAL/PLATELET
BASO%: 0.6 % (ref 0.0–2.0)
Basophils Absolute: 0 10*3/uL (ref 0.0–0.1)
EOS ABS: 0.1 10*3/uL (ref 0.0–0.5)
EOS%: 1.9 % (ref 0.0–7.0)
HCT: 38.3 % — ABNORMAL LOW (ref 38.4–49.9)
HEMOGLOBIN: 12.5 g/dL — AB (ref 13.0–17.1)
LYMPH%: 9.1 % — ABNORMAL LOW (ref 14.0–49.0)
MCH: 30 pg (ref 27.2–33.4)
MCHC: 32.5 g/dL (ref 32.0–36.0)
MCV: 92.2 fL (ref 79.3–98.0)
MONO#: 0.5 10*3/uL (ref 0.1–0.9)
MONO%: 7.3 % (ref 0.0–14.0)
NEUT%: 81.1 % — ABNORMAL HIGH (ref 39.0–75.0)
NEUTROS ABS: 5.9 10*3/uL (ref 1.5–6.5)
Platelets: 221 10*3/uL (ref 140–400)
RBC: 4.15 10*6/uL — AB (ref 4.20–5.82)
RDW: 14.1 % (ref 11.0–14.6)
WBC: 7.2 10*3/uL (ref 4.0–10.3)
lymph#: 0.7 10*3/uL — ABNORMAL LOW (ref 0.9–3.3)

## 2015-12-07 LAB — COMPREHENSIVE METABOLIC PANEL
ALBUMIN: 3.8 g/dL (ref 3.5–5.0)
ALK PHOS: 76 U/L (ref 40–150)
ALT: 17 U/L (ref 0–55)
AST: 24 U/L (ref 5–34)
Anion Gap: 8 mEq/L (ref 3–11)
BUN: 17.4 mg/dL (ref 7.0–26.0)
CHLORIDE: 107 meq/L (ref 98–109)
CO2: 25 meq/L (ref 22–29)
Calcium: 9.5 mg/dL (ref 8.4–10.4)
Creatinine: 1.6 mg/dL — ABNORMAL HIGH (ref 0.7–1.3)
EGFR: 58 mL/min/{1.73_m2} — ABNORMAL LOW (ref >90)
GLUCOSE: 75 mg/dL (ref 70–140)
POTASSIUM: 4.3 meq/L (ref 3.5–5.1)
SODIUM: 140 meq/L (ref 136–145)
Total Bilirubin: 0.41 mg/dL (ref 0.20–1.20)
Total Protein: 7.4 g/dL (ref 6.4–8.3)

## 2015-12-07 LAB — TSH: TSH: 3.441 m(IU)/L (ref 0.320–4.118)

## 2015-12-07 MED ORDER — SODIUM CHLORIDE 0.9 % IV SOLN
Freq: Once | INTRAVENOUS | Status: AC
  Administered 2015-12-07: 13:00:00 via INTRAVENOUS

## 2015-12-07 MED ORDER — SODIUM CHLORIDE 0.9 % IV SOLN
200.0000 mg | Freq: Once | INTRAVENOUS | Status: AC
  Administered 2015-12-07: 200 mg via INTRAVENOUS
  Filled 2015-12-07: qty 4

## 2015-12-07 MED ORDER — SODIUM CHLORIDE 0.9 % IJ SOLN
10.0000 mL | INTRAMUSCULAR | Status: DC | PRN
Start: 2015-12-07 — End: 2015-12-07
  Administered 2015-12-07: 10 mL via INTRAVENOUS
  Filled 2015-12-07: qty 10

## 2015-12-07 MED ORDER — HEPARIN SOD (PORK) LOCK FLUSH 100 UNIT/ML IV SOLN
500.0000 [IU] | Freq: Once | INTRAVENOUS | Status: AC | PRN
  Administered 2015-12-07: 500 [IU]
  Filled 2015-12-07: qty 5

## 2015-12-07 MED ORDER — OXYCODONE HCL 15 MG PO TABS: 15.0000 mg | ORAL_TABLET | ORAL | 0 refills | Status: DC | PRN

## 2015-12-07 MED ORDER — SODIUM CHLORIDE 0.9% FLUSH
10.0000 mL | INTRAVENOUS | Status: DC | PRN
  Administered 2015-12-07: 10 mL
  Filled 2015-12-07: qty 10

## 2015-12-07 MED FILL — oxyCODONE HCL 15 MG TABS: 15 | 15 days supply | Qty: 90 | Fill #0

## 2015-12-07 NOTE — Assessment & Plan Note (Signed)
This is likely anemia of chronic disease. The patient denies recent history of bleeding such as epistaxis, hematuria or hematochezia. He is asymptomatic from the anemia. We will observe for now.  

## 2015-12-07 NOTE — Progress Notes (Signed)
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 FNA of right level III lymph node was inconclusive for cancer      07/25/2013 Imaging    PET scan showed locally advanced disease with abnormal lymphadenopathy in the right axilla      08/06/2013 Procedure    CT-guided biopsy of the lymphadenopathy was negative for malignancy      08/15/2013 Surgery    Patient has placement of port and feeding tube      08/19/2013 - 09/10/2013 Chemotherapy    Patient received chemotherapy with cisplatin. The patient only received 2 doses due to uncontrolled nausea and acute renal failure.      08/19/2013 - 10/15/2013 Radiation Therapy    Received Helical IMRT Tomotherapy:  Right Tonstil and bilateral neck / 70 Gy in 35 fractions to gross disease, 63 Gy in 35 fractions to high risk nodal echelons, and 56 Gy in 35 fractions to intermediate risk nodal echelons.      08/27/2013 - 08/30/2013 Hospital Admission    The patient was admitted to the hospital for uncontrolled nausea vomiting and dehydration.      02/14/2014 Imaging    PET/CT scan showed complete response to treatment      03/19/2014 Surgery    He had excisional lymph node biopsy from the right neck.  Pathology was benign      05/13/2014 Surgery    He had removal of Port-A-Cath.      05/22/2014 Imaging    Repeat CT scan of the neck show no evidence of disease recurrence.      12/09/2014 Imaging    Ct neck without contrast showed persistent abnormalities on the right side of neck, indeterminate      12/25/2014 Imaging    PET CT scan showed disease recurrence.      02/10/2015 Procedure    He has placement of port      02/13/2015 - 07/13/2015 Chemotherapy    He received chemotherapy with carbo/Taxol      04/21/2015 Imaging    PET CT scan showed improved disease control      08/04/2015 Imaging    PET scan showed persistent disease      08/17/2015 -  Chemotherapy    He was started with [facility]      10/19/2015 Imaging    Ct neck showed mass-like intermediate density soft tissue at the right lateral neck recurrence site stable.        INTERVAL HISTORY: Please see below for problem oriented charting. He returns for follow-up, prior to cycle 6 of treatment He denies intermittent discharge from the neck. No recent fever or chills. His pain is stable with current description oxycodone. No nausea or change in bowel habits  REVIEW OF  SYSTEMS:   Constitutional: Denies fevers, chills or abnormal weight loss Eyes: Denies blurriness of vision Ears, nose, mouth, throat, and face: Denies mucositis or sore throat Respiratory: Denies cough, dyspnea or wheezes Cardiovascular: Denies palpitation, chest discomfort or lower extremity swelling Gastrointestinal:  Denies nausea, heartburn or change in bowel habits Skin: Denies abnormal skin rashes Lymphatics: Denies new lymphadenopathy or easy bruising Neurological:Denies numbness, tingling or new weaknesses Behavioral/Psych: Mood is stable, no new changes  All other systems were reviewed with the patient and are negative.  I have reviewed the past medical history, past surgical history, social history and family history with the patient  and they are unchanged from previous note.  ALLERGIES:  is allergic to pollen extract.  MEDICATIONS:  Current Outpatient Prescriptions  Medication Sig Dispense Refill  . fluticasone (FLONASE) 50 MCG/ACT nasal spray Place 2 sprays into both nostrils daily. (Patient taking differently: Place 2 sprays into both nostrils daily as needed for allergies. ) 16 g 2  . lidocaine-prilocaine (EMLA) cream Apply 1 application topically as needed. 30 g 6  . Multiple Vitamin (MULTIVITAMIN WITH MINERALS) TABS tablet Take 1 tablet by mouth daily.    Marland Kitchen omeprazole (PRILOSEC) 20 MG capsule Take 1 capsule (20 mg total) by mouth daily. 30 capsule 0  . oxyCODONE (ROXICODONE) 15 MG immediate release tablet Take 1 tablet (15 mg total) by mouth every 4 (four) hours as needed. 90 tablet 0  . traZODone (DESYREL) 100 MG tablet Take 1 tablet (100 mg total) by mouth at bedtime. 30 tablet 6  . ondansetron (ZOFRAN ODT) 4 MG disintegrating tablet Take 1 tablet (4 mg total) by mouth every 8 (eight) hours as needed for nausea or vomiting. (Patient not taking: Reported on 10/23/2015) 10 tablet 0  . ondansetron (ZOFRAN) 8 MG tablet Take 1 tablet (8 mg total) by mouth every 8 (eight) hours as needed for nausea or vomiting. (Patient not taking: Reported on 10/23/2015) 90 tablet 1  . prochlorperazine (COMPAZINE) 10 MG tablet Take 1 tablet (10 mg total) by mouth every 6 (six) hours as needed (Nausea or vomiting). (Patient not taking: Reported on 10/23/2015) 60 tablet 1   No current facility-administered medications for this visit.    Facility-Administered Medications Ordered in Other Visits  Medication Dose Route Frequency Provider Last Rate Last Dose  . 0.9 %  sodium chloride infusion   Intravenous Once [name], MD      . heparin lock flush 100 unit/mL  500 Units Intracatheter Once PRN [name], MD      . pembrolizumab (KEYTRUDA) 200 mg in sodium chloride 0.9 % 50 mL chemo infusion  200 mg Intravenous Once [name], MD      . sodium  chloride flush (NS) 0.9 % injection 10 mL  10 mL Intracatheter PRN [name], MD        PHYSICAL EXAMINATION: ECOG PERFORMANCE STATUS: 1 - Symptomatic but completely ambulatory  Vitals:   12/07/15 1222  BP: 129/77  Pulse: 75  Resp: 18  Temp: 98 F (36.7 C)   Filed Weights   12/07/15 1222  Weight: 252 lb 14.4 oz (114.7 kg)    GENERAL:alert, no distress and comfortable SKIN: skin color, texture, turgor are normal, no rashes or significant lesions EYES: normal, Conjunctiva are pink and non-injected, sclera clear OROPHARYNX:no exudate, no erythema and lips, buccal mucosa, and tongue normal  NECK: 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 No discharge or open wound LYMPH:  no palpable lymphadenopathy in the cervical,  axillary or inguinal LUNGS: clear to auscultation and percussion with normal breathing effort HEART: regular rate & rhythm and no murmurs and no lower extremity edema ABDOMEN:abdomen soft, non-tender and normal bowel sounds Musculoskeletal:no cyanosis of digits and no clubbing  NEURO: alert & oriented x 3 with fluent speech, no focal motor/sensory deficits  LABORATORY DATA:  I have reviewed the data as listed    Component Value Date/Time   NA 140 12/07/2015 1155   K 4.3 12/07/2015 1155   CL 107 07/23/2015 1513   CO2 25 12/07/2015 1155   GLUCOSE 75 12/07/2015 1155   BUN 17.4 12/07/2015 1155   CREATININE 1.6 (H) 12/07/2015 1155   CALCIUM 9.5 12/07/2015 1155   PROT 7.4 12/07/2015 1155   ALBUMIN 3.8 12/07/2015 1155   AST 24 12/07/2015 1155   ALT 17 12/07/2015 1155   ALKPHOS 76 12/07/2015 1155   BILITOT 0.41 12/07/2015 1155   GFRNONAA 44 (L) 07/23/2015 1513   GFRAA 51 (L) 07/23/2015 1513    No results found for: SPEP, UPEP  Lab Results  Component Value Date   WBC 7.2 12/07/2015   NEUTROABS 5.9 12/07/2015   HGB 12.5 (L) 12/07/2015   HCT 38.3 (L) 12/07/2015   MCV 92.2 12/07/2015   PLT 221 12/07/2015      Chemistry      Component Value  Date/Time   NA 140 12/07/2015 1155   K 4.3 12/07/2015 1155   CL 107 07/23/2015 1513   CO2 25 12/07/2015 1155   BUN 17.4 12/07/2015 1155   CREATININE 1.6 (H) 12/07/2015 1155      Component Value Date/Time   CALCIUM 9.5 12/07/2015 1155   ALKPHOS 76 12/07/2015 1155   AST 24 12/07/2015 1155   ALT 17 12/07/2015 1155   BILITOT 0.41 12/07/2015 1155      ASSESSMENT & PLAN:  Tonsillar cancer Overall, he has stable disease with no signs of diffuse metastatic cancer or cancer progression. I recommend we continue same treatment for now and repeat imaging study in October 2017  Anemia in neoplastic disease This is likely anemia of chronic disease. The patient denies recent history of bleeding such as epistaxis, hematuria or hematochezia. He is asymptomatic from the anemia. We will observe for now.  Chronic kidney disease (CKD), stage II (mild) His kidney function is stable. As long as his creatinine clearance remain greater than 30, we will continue chemotherapy  Cancer associated pain The patient has cancer associated pain. His pain is intermittent in nature and I reassured the patient it is not due to cancer progression I recommend we continue the dose of oxycodone to 15 mg every 4 hours as needed for pain We discussed long-acting pain medicine but the patient would like to avoid that for now.   No orders of the defined types were placed in this encounter.  All questions were answered. The patient knows to call the clinic with any problems, questions or concerns. No barriers to learning was detected. I spent 20 minutes counseling the patient face to face. The total time spent in the appointment was 25 minutes and more than 50% was on counseling and review of test results     [facility], [place], MD 12/07/2015 12:56 PM

## 2015-12-07 NOTE — Assessment & Plan Note (Signed)
Overall, he has stable disease with no signs of diffuse metastatic cancer or cancer progression. I recommend we continue same treatment for now and repeat imaging study in October 2017

## 2015-12-07 NOTE — Assessment & Plan Note (Signed)
His kidney function is stable. As long as his creatinine clearance remain greater than 30, we will continue chemotherapy

## 2015-12-07 NOTE — Patient Instructions (Signed)
[facility] Discharge Instructions for Patients Receiving Chemotherapy  Today you received the following chemotherapy agents keytruda   To help prevent nausea and vomiting after your treatment, we encourage you to take your nausea medication as directed  If you develop nausea and vomiting that is not controlled by your nausea medication, call the clinic.   BELOW ARE SYMPTOMS THAT SHOULD BE REPORTED IMMEDIATELY:  *FEVER GREATER THAN 100.5 F  *CHILLS WITH OR WITHOUT FEVER  NAUSEA AND VOMITING THAT IS NOT CONTROLLED WITH YOUR NAUSEA MEDICATION  *UNUSUAL SHORTNESS OF BREATH  *UNUSUAL BRUISING OR BLEEDING  TENDERNESS IN MOUTH AND THROAT WITH OR WITHOUT PRESENCE OF ULCERS  *URINARY PROBLEMS  *BOWEL PROBLEMS  UNUSUAL RASH Items with * indicate a potential emergency and should be followed up as soon as possible.  Feel free to call the clinic you have any questions or concerns. The clinic phone number is [phone].  

## 2015-12-07 NOTE — Assessment & Plan Note (Addendum)
The patient has cancer associated pain. His pain is intermittent in nature and I reassured the patient it is not due to cancer progression I recommend we continue the dose of oxycodone to 15 mg every 4 hours as needed for pain We discussed long-acting pain medicine but the patient would like to avoid that for now.

## 2015-12-17 ENCOUNTER — Encounter: Payer: Self-pay | Admitting: *Deleted

## 2015-12-17 NOTE — Progress Notes (Signed)
Chart update. 

## 2015-12-18 ENCOUNTER — Telehealth: Payer: Self-pay | Admitting: *Deleted

## 2015-12-18 NOTE — Telephone Encounter (Signed)
Per staff message I have moved appts and called the patient

## 2015-12-28 ENCOUNTER — Encounter: Payer: Self-pay | Admitting: Hematology and Oncology

## 2015-12-28 ENCOUNTER — Ambulatory Visit (HOSPITAL_BASED_OUTPATIENT_CLINIC_OR_DEPARTMENT_OTHER): Payer: Medicaid Other

## 2015-12-28 ENCOUNTER — Ambulatory Visit: Payer: Medicaid Other

## 2015-12-28 ENCOUNTER — Ambulatory Visit (HOSPITAL_BASED_OUTPATIENT_CLINIC_OR_DEPARTMENT_OTHER): Payer: Medicaid Other | Admitting: Hematology and Oncology

## 2015-12-28 ENCOUNTER — Other Ambulatory Visit (HOSPITAL_BASED_OUTPATIENT_CLINIC_OR_DEPARTMENT_OTHER): Payer: Medicaid Other

## 2015-12-28 DIAGNOSIS — C099 Malignant neoplasm of tonsil, unspecified: Secondary | ICD-10-CM

## 2015-12-28 DIAGNOSIS — S1190XD Unspecified open wound of unspecified part of neck, subsequent encounter: Secondary | ICD-10-CM

## 2015-12-28 DIAGNOSIS — Z5112 Encounter for antineoplastic immunotherapy: Secondary | ICD-10-CM | POA: Diagnosis not present

## 2015-12-28 DIAGNOSIS — D630 Anemia in neoplastic disease: Secondary | ICD-10-CM

## 2015-12-28 DIAGNOSIS — Z79899 Other long term (current) drug therapy: Secondary | ICD-10-CM

## 2015-12-28 DIAGNOSIS — G893 Neoplasm related pain (acute) (chronic): Secondary | ICD-10-CM

## 2015-12-28 DIAGNOSIS — N182 Chronic kidney disease, stage 2 (mild): Secondary | ICD-10-CM | POA: Diagnosis not present

## 2015-12-28 LAB — COMPREHENSIVE METABOLIC PANEL
ALT: 15 U/L (ref 0–55)
AST: 24 U/L (ref 5–34)
Albumin: 3.7 g/dL (ref 3.5–5.0)
Alkaline Phosphatase: 74 U/L (ref 40–150)
Anion Gap: 9 mEq/L (ref 3–11)
BILIRUBIN TOTAL: 0.49 mg/dL (ref 0.20–1.20)
BUN: 14.1 mg/dL (ref 7.0–26.0)
CO2: 25 meq/L (ref 22–29)
CREATININE: 1.6 mg/dL — AB (ref 0.7–1.3)
Calcium: 9.1 mg/dL (ref 8.4–10.4)
Chloride: 107 mEq/L (ref 98–109)
EGFR: 58 mL/min/{1.73_m2} — AB (ref >90)
GLUCOSE: 92 mg/dL (ref 70–140)
Potassium: 4.2 mEq/L (ref 3.5–5.1)
SODIUM: 140 meq/L (ref 136–145)
TOTAL PROTEIN: 7.3 g/dL (ref 6.4–8.3)

## 2015-12-28 LAB — CBC WITH DIFFERENTIAL/PLATELET
BASO%: 0.3 % (ref 0.0–2.0)
Basophils Absolute: 0 10*3/uL (ref 0.0–0.1)
EOS%: 2.4 % (ref 0.0–7.0)
Eosinophils Absolute: 0.1 10*3/uL (ref 0.0–0.5)
HCT: 36.5 % — ABNORMAL LOW (ref 38.4–49.9)
HGB: 12.1 g/dL — ABNORMAL LOW (ref 13.0–17.1)
LYMPH%: 9.6 % — ABNORMAL LOW (ref 14.0–49.0)
MCH: 30 pg (ref 27.2–33.4)
MCHC: 33.2 g/dL (ref 32.0–36.0)
MCV: 90.6 fL (ref 79.3–98.0)
MONO#: 0.5 10*3/uL (ref 0.1–0.9)
MONO%: 8.3 % (ref 0.0–14.0)
NEUT%: 79.4 % — ABNORMAL HIGH (ref 39.0–75.0)
NEUTROS ABS: 4.7 10*3/uL (ref 1.5–6.5)
NRBC: 0 % (ref 0–0)
Platelets: 196 10*3/uL (ref 140–400)
RBC: 4.03 10*6/uL — AB (ref 4.20–5.82)
RDW: 13.6 % (ref 11.0–14.6)
WBC: 5.9 10*3/uL (ref 4.0–10.3)
lymph#: 0.6 10*3/uL — ABNORMAL LOW (ref 0.9–3.3)

## 2015-12-28 LAB — TSH: TSH: 3.357 m(IU)/L (ref 0.320–4.118)

## 2015-12-28 MED ORDER — HEPARIN SOD (PORK) LOCK FLUSH 100 UNIT/ML IV SOLN
500.0000 [IU] | Freq: Once | INTRAVENOUS | Status: DC | PRN
  Filled 2015-12-28: qty 5

## 2015-12-28 MED ORDER — SODIUM CHLORIDE 0.9 % IV SOLN
Freq: Once | INTRAVENOUS | Status: AC
  Administered 2015-12-28: 11:00:00 via INTRAVENOUS

## 2015-12-28 MED ORDER — OXYCODONE HCL 15 MG PO TABS: 15.0000 mg | ORAL_TABLET | ORAL | 0 refills | Status: DC | PRN

## 2015-12-28 MED ORDER — SODIUM CHLORIDE 0.9 % IJ SOLN
10.0000 mL | INTRAMUSCULAR | Status: DC | PRN
  Administered 2015-12-28: 10 mL via INTRAVENOUS
  Filled 2015-12-28: qty 10

## 2015-12-28 MED ORDER — PEMBROLIZUMAB CHEMO INJECTION 100 MG/4ML
200.0000 mg | Freq: Once | INTRAVENOUS | Status: AC
  Administered 2015-12-28: 200 mg via INTRAVENOUS
  Filled 2015-12-28: qty 8

## 2015-12-28 MED ORDER — SODIUM CHLORIDE 0.9% FLUSH
10.0000 mL | INTRAVENOUS | Status: DC | PRN
  Administered 2015-12-28: 10 mL
  Filled 2015-12-28: qty 10

## 2015-12-28 MED FILL — oxyCODONE HCL 15 MG TABS: 15 | 15 days supply | Qty: 90 | Fill #0

## 2015-12-28 NOTE — Assessment & Plan Note (Signed)
His kidney function is stable. As long as his creatinine clearance remain greater than 30, we will continue chemotherapy

## 2015-12-28 NOTE — Patient Instructions (Signed)

## 2015-12-28 NOTE — Assessment & Plan Note (Signed)
Overall, he has stable disease with no signs of diffuse metastatic cancer or cancer progression. I recommend we continue same treatment for now and repeat imaging study in October 2017

## 2015-12-28 NOTE — Progress Notes (Signed)
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 FNA of right level III lymph node was inconclusive for cancer      07/25/2013 Imaging    PET scan showed locally advanced disease with abnormal lymphadenopathy in the right axilla      08/06/2013 Procedure    CT-guided biopsy of the lymphadenopathy was negative for malignancy      08/15/2013 Surgery    Patient has placement of port and feeding tube      08/19/2013 - 09/10/2013 Chemotherapy    Patient received chemotherapy with cisplatin. The patient only received 2 doses due to uncontrolled nausea and acute renal failure.      08/19/2013 - 10/15/2013 Radiation Therapy    Received Helical IMRT Tomotherapy:  Right Tonstil and bilateral neck / 70 Gy in 35 fractions to gross disease, 63 Gy in 35 fractions to high risk nodal echelons, and 56 Gy in 35 fractions to intermediate risk nodal echelons.      08/27/2013 - 08/30/2013 Hospital Admission    The patient was admitted to the hospital for uncontrolled nausea vomiting and dehydration.      02/14/2014 Imaging    PET/CT scan showed complete response to treatment      03/19/2014 Surgery    He had excisional lymph node biopsy from the right neck.  Pathology was benign      05/13/2014 Surgery    He had removal of Port-A-Cath.      05/22/2014 Imaging    Repeat CT scan of the neck show no evidence of disease recurrence.      12/09/2014 Imaging    Ct neck without contrast showed persistent abnormalities on the right side of neck, indeterminate      12/25/2014 Imaging    PET CT scan showed disease recurrence.      02/10/2015 Procedure    He has placement of port      02/13/2015 - 07/13/2015 Chemotherapy    He received chemotherapy with carbo/Taxol      04/21/2015 Imaging    PET CT scan showed improved disease control      08/04/2015 Imaging    PET scan showed persistent disease      08/17/2015 -  Chemotherapy    He was started with [facility]      10/19/2015 Imaging    Ct neck showed mass-like intermediate density soft tissue at the right lateral neck recurrence site stable.        INTERVAL HISTORY: Please see below for problem oriented charting. He returns for further follow-up. He denies new symptoms. No worsening discharge from the neck wound. No new lymphadenopathy. He denies dysphagia. He continues to have persistent right neck pain, well-controlled with current prescription pain medicine.  REVIEW OF SYSTEMS:  Constitutional: Denies fevers, chills or abnormal weight loss Eyes: Denies blurriness of vision Ears, nose, mouth, throat, and face: Denies mucositis or sore throat Respiratory: Denies cough, dyspnea or wheezes Cardiovascular: Denies palpitation, chest discomfort or lower extremity swelling Gastrointestinal:  Denies nausea, heartburn or change in bowel habits Skin: Denies abnormal skin rashes Lymphatics: Denies new lymphadenopathy or easy bruising Neurological:Denies numbness, tingling or new weaknesses Behavioral/Psych: Mood is stable, no new changes  All other systems were reviewed with the patient and are negative.  I have reviewed the past medical history, past surgical history, social history and  family history with the patient and they are unchanged from previous note.  ALLERGIES:  is allergic to pollen extract.  MEDICATIONS:  Current Outpatient Prescriptions  Medication Sig Dispense Refill  . fluticasone (FLONASE) 50 MCG/ACT nasal spray Place 2 sprays into both nostrils daily. (Patient taking differently: Place 2 sprays into both nostrils daily as needed for allergies. ) 16 g 2  . lidocaine-prilocaine (EMLA) cream Apply 1 application topically as needed. 30 g 6  . Multiple Vitamin (MULTIVITAMIN WITH MINERALS) TABS tablet Take 1 tablet by mouth daily.    Marland Kitchen omeprazole (PRILOSEC) 20 MG capsule Take 1 capsule (20 mg total) by mouth daily. 30 capsule 0  . ondansetron (ZOFRAN ODT) 4 MG disintegrating tablet Take 1 tablet (4 mg total) by mouth every 8 (eight) hours as needed for nausea or vomiting. 10 tablet 0  . ondansetron (ZOFRAN) 8 MG tablet Take 1 tablet (8 mg total) by mouth every 8 (eight) hours as needed for nausea or vomiting. 90 tablet 1  . oxyCODONE (ROXICODONE) 15 MG immediate release tablet Take 1 tablet (15 mg total) by mouth every 4 (four) hours as needed. 90 tablet 0  . prochlorperazine (COMPAZINE) 10 MG tablet Take 1 tablet (10 mg total) by mouth every 6 (six) hours as needed (Nausea or vomiting). 60 tablet 1  . traZODone (DESYREL) 100 MG tablet Take 1 tablet (100 mg total) by mouth at bedtime. 30 tablet 6   No current facility-administered medications for this visit.    Facility-Administered Medications Ordered in Other Visits  Medication Dose Route Frequency Provider Last Rate Last Dose  . sodium chloride 0.9 % injection 10 mL  10 mL Intravenous PRN [name], MD   10 mL at 12/28/15 1000    PHYSICAL EXAMINATION: ECOG PERFORMANCE STATUS: 1 - Symptomatic but completely ambulatory  Vitals:   12/28/15 0900  BP: (!) 140/93  Pulse: 79  Resp: 20  Temp: 98.8 F (37.1 C)   Filed Weights   12/28/15 0900  Weight: 254 lb 12.8 oz (115.6 kg)    GENERAL:alert, no  distress and comfortable SKIN: skin color, texture, turgor are normal, no rashes or significant lesions EYES: normal, Conjunctiva are pink and non-injected, sclera clear OROPHARYNX:no exudate, no erythema and lips, buccal mucosa, and tongue normal  NECK: His neck is fibrosis from prior treatment. There is an open wound on the right submandibular region. I put a clean Band-Aid over LYMPH:  no palpable lymphadenopathy in the cervical, axillary or inguinal LUNGS: clear to auscultation and percussion with normal breathing effort HEART: regular rate & rhythm and no murmurs and no lower extremity edema ABDOMEN:abdomen soft, non-tender and normal bowel sounds Musculoskeletal:no cyanosis of digits and no clubbing  NEURO: alert & oriented x 3 with fluent speech, no focal motor/sensory deficits  LABORATORY DATA:  I have reviewed the data as listed    Component Value Date/Time   NA 140 12/28/2015  0938   K 4.2 12/28/2015 0938   CL 107 07/23/2015 1513   CO2 25 12/28/2015 0938   GLUCOSE 92 12/28/2015 0938   BUN 14.1 12/28/2015 0938   CREATININE 1.6 (H) 12/28/2015 0938   CALCIUM 9.1 12/28/2015 0938   PROT 7.3 12/28/2015 0938   ALBUMIN 3.7 12/28/2015 0938   AST 24 12/28/2015 0938   ALT 15 12/28/2015 0938   ALKPHOS 74 12/28/2015 0938   BILITOT 0.49 12/28/2015 0938   GFRNONAA 44 (L) 07/23/2015 1513   GFRAA 51 (L) 07/23/2015 1513    No results found for: SPEP, UPEP  Lab Results  Component Value Date   WBC 5.9 12/28/2015   NEUTROABS 4.7 12/28/2015   HGB 12.1 (L) 12/28/2015   HCT 36.5 (L) 12/28/2015   MCV 90.6 12/28/2015   PLT 196 12/28/2015      Chemistry      Component Value Date/Time   NA 140 12/28/2015 0938   K 4.2 12/28/2015 0938   CL 107 07/23/2015 1513   CO2 25 12/28/2015 0938   BUN 14.1 12/28/2015 0938   CREATININE 1.6 (H) 12/28/2015 0938      Component Value Date/Time   CALCIUM 9.1 12/28/2015 0938   ALKPHOS 74 12/28/2015 0938   AST 24 12/28/2015 0938   ALT 15  12/28/2015 0938   BILITOT 0.49 12/28/2015 0938      ASSESSMENT & PLAN:  Tonsillar cancer Overall, he has stable disease with no signs of diffuse metastatic cancer or cancer progression. I recommend we continue same treatment for now and repeat imaging study in October 2017  Anemia in neoplastic disease This is likely anemia of chronic disease. The patient denies recent history of bleeding such as epistaxis, hematuria or hematochezia. He is asymptomatic from the anemia. We will observe for now.  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 His pain is intermittent in nature and I reassured the patient it is not due to cancer progression I recommend we continue the dose of oxycodone to 15 mg every 4 hours as needed for pain We discussed long-acting pain medicine but the patient would like to avoid that for now.  Chronic kidney disease (CKD), stage II (mild) His kidney function is stable. As long as his creatinine clearance remain greater than 30, we will continue chemotherapy  Open neck wound The patient has an open wound. It does not look infected I put on the dressing over it. This is due to poor wound healing related to prior surgery and radiation. I reassured the patient   No orders of the defined types were placed in this encounter.  All questions were answered. The patient knows to call the clinic with any problems, questions or concerns. No barriers to learning was detected. I spent 25 minutes counseling the patient face to face. The total time spent in the appointment was 30 minutes and more than 50% was on counseling and review of test results     [facility], [place], MD 12/28/2015 10:58 AM

## 2015-12-28 NOTE — Patient Instructions (Signed)
[facility] Discharge Instructions for Patients Receiving Chemotherapy  Today you received the following chemotherapy agents keytruda   To help prevent nausea and vomiting after your treatment, we encourage you to take your nausea medication as directed  If you develop nausea and vomiting that is not controlled by your nausea medication, call the clinic.   BELOW ARE SYMPTOMS THAT SHOULD BE REPORTED IMMEDIATELY:  *FEVER GREATER THAN 100.5 F  *CHILLS WITH OR WITHOUT FEVER  NAUSEA AND VOMITING THAT IS NOT CONTROLLED WITH YOUR NAUSEA MEDICATION  *UNUSUAL SHORTNESS OF BREATH  *UNUSUAL BRUISING OR BLEEDING  TENDERNESS IN MOUTH AND THROAT WITH OR WITHOUT PRESENCE OF ULCERS  *URINARY PROBLEMS  *BOWEL PROBLEMS  UNUSUAL RASH Items with * indicate a potential emergency and should be followed up as soon as possible.  Feel free to call the clinic you have any questions or concerns. The clinic phone number is [phone].  

## 2015-12-28 NOTE — Assessment & Plan Note (Signed)
The patient has cancer associated pain. His pain is intermittent in nature and I reassured the patient it is not due to cancer progression I recommend we continue the dose of oxycodone to 15 mg every 4 hours as needed for pain We discussed long-acting pain medicine but the patient would like to avoid that for now.

## 2015-12-28 NOTE — Assessment & Plan Note (Signed)
This is likely anemia of chronic disease. The patient denies recent history of bleeding such as epistaxis, hematuria or hematochezia. He is asymptomatic from the anemia. We will observe for now.  

## 2015-12-28 NOTE — Assessment & Plan Note (Signed)
The patient has an open wound. It does not look infected I put on the dressing over it. This is due to poor wound healing related to prior surgery and radiation. I reassured the patient

## 2016-01-14 ENCOUNTER — Telehealth: Payer: Self-pay | Admitting: Hematology and Oncology

## 2016-01-14 NOTE — Telephone Encounter (Signed)
Left message re 10/2 appointments. Schedule mailed.

## 2016-01-18 ENCOUNTER — Encounter: Payer: Self-pay | Admitting: *Deleted

## 2016-01-18 ENCOUNTER — Ambulatory Visit (HOSPITAL_BASED_OUTPATIENT_CLINIC_OR_DEPARTMENT_OTHER): Payer: Medicaid Other

## 2016-01-18 ENCOUNTER — Other Ambulatory Visit (HOSPITAL_BASED_OUTPATIENT_CLINIC_OR_DEPARTMENT_OTHER): Payer: Medicaid Other

## 2016-01-18 ENCOUNTER — Ambulatory Visit (HOSPITAL_BASED_OUTPATIENT_CLINIC_OR_DEPARTMENT_OTHER): Payer: Medicaid Other | Admitting: Hematology and Oncology

## 2016-01-18 ENCOUNTER — Encounter: Payer: Self-pay | Admitting: Hematology and Oncology

## 2016-01-18 ENCOUNTER — Ambulatory Visit: Payer: Medicaid Other | Admitting: Nutrition

## 2016-01-18 ENCOUNTER — Ambulatory Visit: Payer: Medicaid Other

## 2016-01-18 VITALS — BP 127/88 | HR 82 | Temp 98.2°F | Resp 18 | Wt 250.0 lb

## 2016-01-18 DIAGNOSIS — R634 Abnormal weight loss: Secondary | ICD-10-CM

## 2016-01-18 DIAGNOSIS — Z79899 Other long term (current) drug therapy: Secondary | ICD-10-CM

## 2016-01-18 DIAGNOSIS — N182 Chronic kidney disease, stage 2 (mild): Secondary | ICD-10-CM

## 2016-01-18 DIAGNOSIS — D630 Anemia in neoplastic disease: Secondary | ICD-10-CM

## 2016-01-18 DIAGNOSIS — Z23 Encounter for immunization: Secondary | ICD-10-CM | POA: Diagnosis present

## 2016-01-18 DIAGNOSIS — Z5112 Encounter for antineoplastic immunotherapy: Secondary | ICD-10-CM | POA: Diagnosis present

## 2016-01-18 DIAGNOSIS — C099 Malignant neoplasm of tonsil, unspecified: Secondary | ICD-10-CM

## 2016-01-18 DIAGNOSIS — G893 Neoplasm related pain (acute) (chronic): Secondary | ICD-10-CM | POA: Diagnosis not present

## 2016-01-18 LAB — COMPREHENSIVE METABOLIC PANEL
ALBUMIN: 3.7 g/dL (ref 3.5–5.0)
ALK PHOS: 85 U/L (ref 40–150)
ALT: 16 U/L (ref 0–55)
ANION GAP: 10 meq/L (ref 3–11)
AST: 27 U/L (ref 5–34)
BUN: 25.1 mg/dL (ref 7.0–26.0)
CALCIUM: 9.4 mg/dL (ref 8.4–10.4)
CHLORIDE: 106 meq/L (ref 98–109)
CO2: 23 mEq/L (ref 22–29)
CREATININE: 1.6 mg/dL — AB (ref 0.7–1.3)
EGFR: 58 mL/min/{1.73_m2} — ABNORMAL LOW (ref >90)
Glucose: 80 mg/dl (ref 70–140)
Potassium: 4.8 mEq/L (ref 3.5–5.1)
Sodium: 139 mEq/L (ref 136–145)
Total Bilirubin: 0.39 mg/dL (ref 0.20–1.20)
Total Protein: 7.6 g/dL (ref 6.4–8.3)

## 2016-01-18 LAB — TSH: TSH: 4.384 m[IU]/L — AB (ref 0.320–4.118)

## 2016-01-18 LAB — CBC WITH DIFFERENTIAL/PLATELET
BASO%: 0.2 % (ref 0.0–2.0)
BASOS ABS: 0 10*3/uL (ref 0.0–0.1)
EOS%: 1.2 % (ref 0.0–7.0)
Eosinophils Absolute: 0.1 10*3/uL (ref 0.0–0.5)
HEMATOCRIT: 39.1 % (ref 38.4–49.9)
HEMOGLOBIN: 12.7 g/dL — AB (ref 13.0–17.1)
LYMPH#: 0.8 10*3/uL — AB (ref 0.9–3.3)
LYMPH%: 9.1 % — ABNORMAL LOW (ref 14.0–49.0)
MCH: 29.4 pg (ref 27.2–33.4)
MCHC: 32.5 g/dL (ref 32.0–36.0)
MCV: 90.5 fL (ref 79.3–98.0)
MONO#: 0.5 10*3/uL (ref 0.1–0.9)
MONO%: 5.6 % (ref 0.0–14.0)
NEUT#: 7.1 10*3/uL — ABNORMAL HIGH (ref 1.5–6.5)
NEUT%: 83.9 % — AB (ref 39.0–75.0)
PLATELETS: 262 10*3/uL (ref 140–400)
RBC: 4.32 10*6/uL (ref 4.20–5.82)
RDW: 13.4 % (ref 11.0–14.6)
WBC: 8.5 10*3/uL (ref 4.0–10.3)

## 2016-01-18 MED ORDER — HEPARIN SOD (PORK) LOCK FLUSH 100 UNIT/ML IV SOLN
500.0000 [IU] | Freq: Once | INTRAVENOUS | Status: AC | PRN
  Administered 2016-01-18: 500 [IU]
  Filled 2016-01-18: qty 5

## 2016-01-18 MED ORDER — SODIUM CHLORIDE 0.9 % IV SOLN
Freq: Once | INTRAVENOUS | Status: AC
  Administered 2016-01-18: 10:00:00 via INTRAVENOUS

## 2016-01-18 MED ORDER — INFLUENZA VAC SPLIT QUAD 0.5 ML IM SUSY
0.5000 mL | PREFILLED_SYRINGE | Freq: Once | INTRAMUSCULAR | Status: AC
  Administered 2016-01-18: 0.5 mL via INTRAMUSCULAR
  Filled 2016-01-18: qty 0.5

## 2016-01-18 MED ORDER — SODIUM CHLORIDE 0.9% FLUSH
10.0000 mL | INTRAVENOUS | Status: DC | PRN
  Administered 2016-01-18: 10 mL
  Filled 2016-01-18: qty 10

## 2016-01-18 MED ORDER — SODIUM CHLORIDE 0.9 % IJ SOLN
10.0000 mL | INTRAMUSCULAR | Status: DC | PRN
  Administered 2016-01-18: 10 mL via INTRAVENOUS
  Filled 2016-01-18: qty 10

## 2016-01-18 MED ORDER — SODIUM CHLORIDE 0.9 % IV SOLN
200.0000 mg | Freq: Once | INTRAVENOUS | Status: AC
  Administered 2016-01-18: 200 mg via INTRAVENOUS
  Filled 2016-01-18: qty 8

## 2016-01-18 MED ORDER — OXYCODONE HCL 15 MG PO TABS: 15.0000 mg | ORAL_TABLET | ORAL | 0 refills | Status: DC | PRN

## 2016-01-18 MED FILL — oxyCODONE HCL 15 MG TABS: 15 | 15 days supply | Qty: 90 | Fill #0

## 2016-01-18 NOTE — Patient Instructions (Signed)

## 2016-01-18 NOTE — Assessment & Plan Note (Signed)
He has mild weight loss due to reduced appetite recently. Overall, the patient does not appear to be malnourished. I reassured the patient. I plan to repeat CT scan in the next visit to exclude disease progression.

## 2016-01-18 NOTE — Assessment & Plan Note (Signed)
Overall, he has stable disease with no signs of diffuse metastatic cancer or cancer progression. I recommend we continue same treatment for now and repeat imaging study in October 2017

## 2016-01-18 NOTE — Assessment & Plan Note (Signed)
The patient has cancer associated pain. His pain is intermittent in nature and I reassured the patient it is not due to cancer progression I recommend we continue the dose of oxycodone to 15 mg every 4 hours as needed for pain 

## 2016-01-18 NOTE — Progress Notes (Signed)
Patient requesting samples of oral nutrition supplements. Patient is now receiving daily Keytruda and has an approximately 5 pound weight loss. He reports episodes of some nausea on the day of chemotherapy before he gets here. He is drinking ensure and Carnation breakfast essentials.  Provided patient with oral nutrition supplement samples and encouraged high-calorie, high-protein foods throughout the day for weight maintenance. Recommended he discuss nausea with physician if this continues.  Patient was appreciative of samples.

## 2016-01-18 NOTE — Assessment & Plan Note (Signed)
His kidney function is stable. As long as his creatinine clearance remain greater than 30, we will continue chemotherapy

## 2016-01-18 NOTE — Progress Notes (Signed)
Oncology Nurse Navigator Documentation  Met with Mr. Haack in Infusion where he was receiving Keytruda treatment. He reported doing well, staying active, "counting each day as a blessing". He understands I can be contacted with needs/concerns.  Gayleen Orem, RN, BSN, [place] [facility] [place] [phone]

## 2016-01-18 NOTE — Progress Notes (Signed)
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 FNA of right level III lymph node was inconclusive for cancer      07/25/2013 Imaging    PET scan showed locally advanced disease with abnormal lymphadenopathy in the right axilla      08/06/2013 Procedure    CT-guided biopsy of the lymphadenopathy was negative for malignancy      08/15/2013 Surgery    Patient has placement of port and feeding tube      08/19/2013 - 09/10/2013 Chemotherapy    Patient received chemotherapy with cisplatin. The patient only received 2 doses due to uncontrolled nausea and acute renal failure.      08/19/2013 - 10/15/2013 Radiation Therapy    Received Helical IMRT Tomotherapy:  Right Tonstil and bilateral neck / 70 Gy in 35 fractions to gross disease, 63 Gy in 35 fractions to high risk nodal echelons, and 56 Gy in 35 fractions to intermediate risk nodal echelons.      08/27/2013 - 08/30/2013 Hospital Admission    The patient was admitted to the hospital for uncontrolled nausea vomiting and dehydration.      02/14/2014 Imaging    PET/CT scan showed complete response to treatment      03/19/2014 Surgery    He had excisional lymph node biopsy from the right neck.  Pathology was benign      05/13/2014 Surgery    He had removal of Port-A-Cath.      05/22/2014 Imaging    Repeat CT scan of the neck show no evidence of disease recurrence.      12/09/2014 Imaging    Ct neck without contrast showed persistent abnormalities on the right side of neck, indeterminate      12/25/2014 Imaging    PET CT scan showed disease recurrence.      02/10/2015 Procedure    He has placement of port      02/13/2015 - 07/13/2015 Chemotherapy    He received chemotherapy with carbo/Taxol      04/21/2015 Imaging    PET CT scan showed improved disease control      08/04/2015 Imaging    PET scan showed persistent disease      08/17/2015 -  Chemotherapy    He was started with [facility]      10/19/2015 Imaging    Ct neck showed mass-like intermediate density soft tissue at the right lateral neck recurrence site stable.        INTERVAL HISTORY: Please see below for problem oriented charting. He returns to receive cycle 8 of treatment. He denies recent worsening neck pain, chest pain or shortness of breath. He complained of worsening dry mouth but decline medication for this. He had recent loss of appetite and mild weight  loss. Denies recent infection  REVIEW OF SYSTEMS:   Constitutional: Denies fevers, chills  Eyes: Denies blurriness of vision Ears, nose, mouth, throat, and face: Denies mucositis or sore throat Respiratory: Denies cough, dyspnea or wheezes Cardiovascular: Denies palpitation, chest discomfort or lower extremity swelling Gastrointestinal:  Denies nausea, heartburn or change in bowel habits Skin: Denies abnormal skin rashes Lymphatics: Denies new lymphadenopathy or easy bruising Neurological:Denies numbness, tingling or new weaknesses Behavioral/Psych: Mood is stable, no new changes  All other systems were reviewed with the patient and are negative.  I have reviewed the past medical history, past surgical history, social history and family  history with the patient and they are unchanged from previous note.  ALLERGIES:  is allergic to pollen extract.  MEDICATIONS:  Current Outpatient Prescriptions  Medication Sig Dispense Refill  . fluticasone (FLONASE) 50 MCG/ACT nasal spray Place 2 sprays into both nostrils daily. (Patient taking differently: Place 2 sprays into both nostrils daily as needed for allergies. ) 16 g 2  . lidocaine-prilocaine (EMLA) cream Apply 1 application topically as needed. 30 g 6  . Multiple Vitamin (MULTIVITAMIN WITH MINERALS) TABS tablet Take 1 tablet by mouth daily.    Marland Kitchen omeprazole (PRILOSEC) 20 MG capsule Take 1 capsule (20 mg total) by mouth daily. 30 capsule 0  . ondansetron (ZOFRAN ODT) 4 MG disintegrating tablet Take 1 tablet (4 mg total) by mouth every 8 (eight) hours as needed for nausea or vomiting. 10 tablet 0  . ondansetron (ZOFRAN) 8 MG tablet Take 1 tablet (8 mg total) by mouth every 8 (eight) hours as needed for nausea or vomiting. 90 tablet 1  . oxyCODONE (ROXICODONE) 15 MG immediate release tablet Take 1 tablet (15 mg total) by mouth every 4 (four) hours as needed. 90 tablet 0  . prochlorperazine (COMPAZINE) 10 MG tablet Take 1 tablet (10 mg total) by mouth every 6 (six) hours as needed (Nausea or vomiting). 60 tablet 1  . traZODone (DESYREL) 100 MG tablet Take 1 tablet (100 mg total) by mouth at bedtime. 30 tablet 6   No current facility-administered medications for this visit.     PHYSICAL EXAMINATION: ECOG PERFORMANCE STATUS: 1 - Symptomatic but completely ambulatory  Vitals:   01/18/16 0900  BP: 127/88  Pulse: 82  Resp: 18  Temp: 98.2 F (36.8 C)   Filed Weights   01/18/16 0900  Weight: 250 lb (113.4 kg)    GENERAL:alert, no distress and comfortable SKIN: skin color, texture, turgor are normal, no rashes or significant lesions EYES: normal, Conjunctiva are pink and non-injected, sclera clear OROPHARYNX:no exudate, no erythema and lips, buccal mucosa, and tongue normal   NECK: Well-healed surgical scars. LYMPH:  no palpable lymphadenopathy in the cervical, axillary or inguinal LUNGS: clear to auscultation and percussion with normal breathing effort HEART: regular rate & rhythm and no murmurs and no lower extremity edema ABDOMEN:abdomen soft, non-tender and normal bowel sounds Musculoskeletal:no cyanosis of digits and no clubbing  NEURO: alert & oriented x 3 with fluent speech, no focal motor/sensory deficits  LABORATORY DATA:  I have reviewed the data as listed    Component Value Date/Time   NA 140 12/28/2015 0938   K 4.2 12/28/2015 0938   CL 107 07/23/2015 1513   CO2 25 12/28/2015 0938   GLUCOSE 92 12/28/2015 0938   BUN 14.1 12/28/2015 0938   CREATININE 1.6 (H) 12/28/2015 0938   CALCIUM 9.1 12/28/2015 0938   PROT 7.3 12/28/2015 0938   ALBUMIN 3.7 12/28/2015 [id number]  AST 24 12/28/2015 0938   ALT 15 12/28/2015 0938   ALKPHOS 74 12/28/2015 0938   BILITOT 0.49 12/28/2015 0938   GFRNONAA 44 (L) 07/23/2015 1513   GFRAA 51 (L) 07/23/2015 1513    No results found for: SPEP, UPEP  Lab Results  Component Value Date   WBC 8.5 01/18/2016   NEUTROABS 7.1 (H) 01/18/2016   HGB 12.7 (L) 01/18/2016   HCT 39.1 01/18/2016   MCV 90.5 01/18/2016   PLT 262 01/18/2016      Chemistry      Component Value Date/Time   NA 140 12/28/2015 0938   K 4.2 12/28/2015 0938   CL 107 07/23/2015 1513   CO2 25 12/28/2015 0938   BUN 14.1 12/28/2015 0938   CREATININE 1.6 (H) 12/28/2015 0938      Component Value Date/Time   CALCIUM 9.1 12/28/2015 0938   ALKPHOS 74 12/28/2015 0938   AST 24 12/28/2015 0938   ALT 15 12/28/2015 0938   BILITOT 0.49 12/28/2015 0938     ASSESSMENT & PLAN:  Tonsillar cancer Overall, he has stable disease with no signs of diffuse metastatic cancer or cancer progression. I recommend we continue same treatment for now and repeat imaging study in October 2017  Anemia in neoplastic disease This is likely anemia of chronic disease. The  patient denies recent history of bleeding such as epistaxis, hematuria or hematochezia. He is asymptomatic from the anemia. We will observe for now.  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 His pain is intermittent in nature and I reassured the patient it is not due to cancer progression I recommend we continue the dose of oxycodone to 15 mg every 4 hours as needed for pain  Chronic kidney disease (CKD), stage II (mild) His kidney function is stable. As long as his creatinine clearance remain greater than 30, we will continue chemotherapy  Weight loss He has mild weight loss due to reduced appetite recently. Overall, the patient does not appear to be malnourished. I reassured the patient. I plan to repeat CT scan in the next visit to exclude disease progression.   Orders Placed This Encounter  Procedures  . CT Soft Tissue Neck Wo Contrast    Standing Status:   Future    Standing Expiration Date:   01/17/2017    Order Specific Question:   Reason for Exam (SYMPTOM  OR DIAGNOSIS REQUIRED)    Answer:   tonsil cancer, assess response to Rx    Order Specific Question:   Preferred imaging location?    Answer:   [place] [facility]    Standing Status:   Future    Standing Expiration Date:   02/21/2017    Order Specific Question:   Reason for exam:    Answer:   tonsil cancer, weight loss, exclude progression    Order Specific Question:   Preferred imaging location?    Answer:   [facility]   All questions were answered. The patient knows to call the clinic with any problems, questions or concerns. No barriers to learning was detected. I spent 20 minutes counseling the patient face to face. The total time spent in the appointment was 30 minutes and more than 50% was on counseling and review of test results     [name], MD 01/18/2016 9:38 AM

## 2016-01-18 NOTE — Patient Instructions (Signed)
[facility] Discharge Instructions for Patients Receiving Chemotherapy  Today you received the following chemotherapy agents:  Keytruda.  To help prevent nausea and vomiting after your treatment, we encourage you to take your nausea medication as prescribed.   If you develop nausea and vomiting that is not controlled by your nausea medication, call the clinic.   BELOW ARE SYMPTOMS THAT SHOULD BE REPORTED IMMEDIATELY:  *FEVER GREATER THAN 100.5 F  *CHILLS WITH OR WITHOUT FEVER  NAUSEA AND VOMITING THAT IS NOT CONTROLLED WITH YOUR NAUSEA MEDICATION  *UNUSUAL SHORTNESS OF BREATH  *UNUSUAL BRUISING OR BLEEDING  TENDERNESS IN MOUTH AND THROAT WITH OR WITHOUT PRESENCE OF ULCERS  *URINARY PROBLEMS  *BOWEL PROBLEMS  UNUSUAL RASH Items with * indicate a potential emergency and should be followed up as soon as possible.  Feel free to call the clinic you have any questions or concerns. The clinic phone number is [phone].  Please show the CHEMO ALERT CARD at check-in to the Emergency Department and triage nurse.   

## 2016-01-18 NOTE — Assessment & Plan Note (Signed)
This is likely anemia of chronic disease. The patient denies recent history of bleeding such as epistaxis, hematuria or hematochezia. He is asymptomatic from the anemia. We will observe for now.  

## 2016-01-22 ENCOUNTER — Telehealth: Payer: Self-pay

## 2016-01-22 NOTE — Telephone Encounter (Signed)
Called with appts per los and he is aware of 02/08/16

## 2016-02-08 ENCOUNTER — Ambulatory Visit (HOSPITAL_BASED_OUTPATIENT_CLINIC_OR_DEPARTMENT_OTHER): Payer: Medicaid Other

## 2016-02-08 ENCOUNTER — Other Ambulatory Visit (HOSPITAL_BASED_OUTPATIENT_CLINIC_OR_DEPARTMENT_OTHER): Payer: Medicaid Other

## 2016-02-08 ENCOUNTER — Ambulatory Visit (HOSPITAL_BASED_OUTPATIENT_CLINIC_OR_DEPARTMENT_OTHER): Payer: Medicaid Other | Admitting: Hematology and Oncology

## 2016-02-08 ENCOUNTER — Encounter: Payer: Self-pay | Admitting: Hematology and Oncology

## 2016-02-08 ENCOUNTER — Ambulatory Visit: Payer: Medicaid Other

## 2016-02-08 DIAGNOSIS — G893 Neoplasm related pain (acute) (chronic): Secondary | ICD-10-CM

## 2016-02-08 DIAGNOSIS — Z5112 Encounter for antineoplastic immunotherapy: Secondary | ICD-10-CM | POA: Diagnosis present

## 2016-02-08 DIAGNOSIS — C099 Malignant neoplasm of tonsil, unspecified: Secondary | ICD-10-CM

## 2016-02-08 DIAGNOSIS — N182 Chronic kidney disease, stage 2 (mild): Secondary | ICD-10-CM | POA: Diagnosis not present

## 2016-02-08 DIAGNOSIS — Z79899 Other long term (current) drug therapy: Secondary | ICD-10-CM

## 2016-02-08 DIAGNOSIS — D630 Anemia in neoplastic disease: Secondary | ICD-10-CM

## 2016-02-08 DIAGNOSIS — R0981 Nasal congestion: Secondary | ICD-10-CM | POA: Diagnosis not present

## 2016-02-08 LAB — CBC WITH DIFFERENTIAL/PLATELET
BASO%: 0.6 % (ref 0.0–2.0)
BASOS ABS: 0.1 10*3/uL (ref 0.0–0.1)
EOS%: 2 % (ref 0.0–7.0)
Eosinophils Absolute: 0.2 10*3/uL (ref 0.0–0.5)
HEMATOCRIT: 39.1 % (ref 38.4–49.9)
HEMOGLOBIN: 12.7 g/dL — AB (ref 13.0–17.1)
LYMPH#: 0.6 10*3/uL — AB (ref 0.9–3.3)
LYMPH%: 7.5 % — ABNORMAL LOW (ref 14.0–49.0)
MCH: 28.8 pg (ref 27.2–33.4)
MCHC: 32.4 g/dL (ref 32.0–36.0)
MCV: 88.9 fL (ref 79.3–98.0)
MONO#: 0.6 10*3/uL (ref 0.1–0.9)
MONO%: 6.8 % (ref 0.0–14.0)
NEUT#: 6.8 10*3/uL — ABNORMAL HIGH (ref 1.5–6.5)
NEUT%: 83.1 % — ABNORMAL HIGH (ref 39.0–75.0)
Platelets: 268 10*3/uL (ref 140–400)
RBC: 4.4 10*6/uL (ref 4.20–5.82)
RDW: 13.9 % (ref 11.0–14.6)
WBC: 8.2 10*3/uL (ref 4.0–10.3)

## 2016-02-08 LAB — COMPREHENSIVE METABOLIC PANEL
ALBUMIN: 3.4 g/dL — AB (ref 3.5–5.0)
ALK PHOS: 88 U/L (ref 40–150)
ALT: 18 U/L (ref 0–55)
AST: 24 U/L (ref 5–34)
Anion Gap: 10 mEq/L (ref 3–11)
BILIRUBIN TOTAL: 0.35 mg/dL (ref 0.20–1.20)
BUN: 19.5 mg/dL (ref 7.0–26.0)
CALCIUM: 9.3 mg/dL (ref 8.4–10.4)
CO2: 24 mEq/L (ref 22–29)
CREATININE: 1.6 mg/dL — AB (ref 0.7–1.3)
Chloride: 106 mEq/L (ref 98–109)
EGFR: 56 mL/min/{1.73_m2} — ABNORMAL LOW (ref >90)
Glucose: 105 mg/dl (ref 70–140)
Potassium: 4.2 mEq/L (ref 3.5–5.1)
Sodium: 139 mEq/L (ref 136–145)
Total Protein: 7.7 g/dL (ref 6.4–8.3)

## 2016-02-08 LAB — TSH: TSH: 3.673 m[IU]/L (ref 0.320–4.118)

## 2016-02-08 MED ORDER — FLUTICASONE PROPIONATE 50 MCG/ACT NA SUSP: 2.0000 | Freq: Every day | NASAL | 2 refills | Status: DC

## 2016-02-08 MED ORDER — SODIUM CHLORIDE 0.9 % IV SOLN
Freq: Once | INTRAVENOUS | Status: AC
  Administered 2016-02-08: 15:00:00 via INTRAVENOUS

## 2016-02-08 MED ORDER — HEPARIN SOD (PORK) LOCK FLUSH 100 UNIT/ML IV SOLN
500.0000 [IU] | Freq: Once | INTRAVENOUS | Status: AC | PRN
  Administered 2016-02-08: 500 [IU]
  Filled 2016-02-08: qty 5

## 2016-02-08 MED ORDER — SODIUM CHLORIDE 0.9 % IV SOLN
200.0000 mg | Freq: Once | INTRAVENOUS | Status: AC
  Administered 2016-02-08: 200 mg via INTRAVENOUS
  Filled 2016-02-08: qty 8

## 2016-02-08 MED ORDER — SODIUM CHLORIDE 0.9 % IJ SOLN
10.0000 mL | INTRAMUSCULAR | Status: DC | PRN
  Administered 2016-02-08: 10 mL via INTRAVENOUS
  Filled 2016-02-08: qty 10

## 2016-02-08 MED ORDER — OXYCODONE HCL 15 MG PO TABS: 15.0000 mg | ORAL_TABLET | ORAL | 0 refills | Status: DC | PRN

## 2016-02-08 MED ORDER — SODIUM CHLORIDE 0.9% FLUSH
10.0000 mL | INTRAVENOUS | Status: DC | PRN
  Administered 2016-02-08: 10 mL
  Filled 2016-02-08: qty 10

## 2016-02-08 MED FILL — FLUTICASONE PROP 50 MCG SPR: 50 | 30 days supply | Qty: 16 | Fill #0

## 2016-02-08 MED FILL — oxyCODONE HCL 15 MG TABS: 15 | 15 days supply | Qty: 90 | Fill #0

## 2016-02-08 NOTE — Assessment & Plan Note (Signed)
The patient has cancer associated pain. His pain is intermittent in nature and I reassured the patient it is not due to cancer progression I recommend we continue the dose of oxycodone to 15 mg every 4 hours as needed for pain 

## 2016-02-08 NOTE — Assessment & Plan Note (Signed)
His kidney function is stable. As long as his creatinine clearance remain greater than 30, we will continue chemotherapy

## 2016-02-08 NOTE — Assessment & Plan Note (Signed)
Overall, he has stable disease with no signs of diffuse metastatic cancer or cancer progression. I recommend we continue same treatment for now and repeat imaging study at the end of the year

## 2016-02-08 NOTE — Assessment & Plan Note (Signed)
This is likely anemia of chronic disease. The patient denies recent history of bleeding such as epistaxis, hematuria or hematochezia. He is asymptomatic from the anemia. We will observe for now.  

## 2016-02-08 NOTE — Assessment & Plan Note (Signed)
He has significant sinus congestion. I prescribed nasal spray for him to use as needed

## 2016-02-08 NOTE — Progress Notes (Signed)
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 FNA of right level III lymph node was inconclusive for cancer      07/25/2013 Imaging    PET scan showed locally advanced disease with abnormal lymphadenopathy in the right axilla      08/06/2013 Procedure    CT-guided biopsy of the lymphadenopathy was negative for malignancy      08/15/2013 Surgery    Patient has placement of port and feeding tube      08/19/2013 - 09/10/2013 Chemotherapy    Patient received chemotherapy with cisplatin. The patient only received 2 doses due to uncontrolled nausea and acute renal failure.      08/19/2013 - 10/15/2013 Radiation Therapy    Received Helical IMRT Tomotherapy:  Right Tonstil and bilateral neck / 70 Gy in 35 fractions to gross disease, 63 Gy in 35 fractions to high risk nodal echelons, and 56 Gy in 35 fractions to intermediate risk nodal echelons.      08/27/2013 - 08/30/2013 Hospital Admission    The patient was admitted to the hospital for uncontrolled nausea vomiting and dehydration.      02/14/2014 Imaging    PET/CT scan showed complete response to treatment      03/19/2014 Surgery    He had excisional lymph node biopsy from the right neck.  Pathology was benign      05/13/2014 Surgery    He had removal of Port-A-Cath.      05/22/2014 Imaging    Repeat CT scan of the neck show no evidence of disease recurrence.      12/09/2014 Imaging    Ct neck without contrast showed persistent abnormalities on the right side of neck, indeterminate      12/25/2014 Imaging    PET CT scan showed disease recurrence.      02/10/2015 Procedure    He has placement of port      02/13/2015 - 07/13/2015 Chemotherapy    He received chemotherapy with carbo/Taxol      04/21/2015 Imaging    PET CT scan showed improved disease control      08/04/2015 Imaging    PET scan showed persistent disease      08/17/2015 -  Chemotherapy    He was started with [facility]      10/19/2015 Imaging    Ct neck showed mass-like intermediate density soft tissue at the right lateral neck recurrence site stable.        INTERVAL HISTORY: Please see below for problem oriented charting. He is seen today prior to treatment. He denies recent problems. He had persistent right neck pain, rated his pain at 4 out of 10. He denies swallowing difficulties. He has some sinus nasal drainage but no recent fever, chills  or cough. Denies recent drainage from his right neck.  REVIEW OF SYSTEMS:   Constitutional: Denies fevers, chills or abnormal weight loss Eyes: Denies blurriness of vision Respiratory: Denies cough, dyspnea or wheezes Cardiovascular: Denies palpitation, chest discomfort or lower extremity swelling Gastrointestinal:  Denies nausea, heartburn or change in bowel habits Skin: Denies abnormal skin rashes Lymphatics: Denies new lymphadenopathy or easy bruising Neurological:Denies numbness, tingling or new weaknesses Behavioral/Psych: Mood is stable, no new changes  All other systems were reviewed with the patient and are negative.  I have reviewed the past medical history, past surgical history, social history and family history with the patient and they are  unchanged from previous note.  ALLERGIES:  is allergic to pollen extract.  MEDICATIONS:  Current Outpatient Prescriptions  Medication Sig Dispense Refill  . fluticasone (FLONASE) 50 MCG/ACT nasal spray Place 2 sprays into both nostrils daily. 16 g 2  . lidocaine-prilocaine (EMLA) cream Apply 1 application topically as needed. 30 g 6  . Multiple Vitamin (MULTIVITAMIN WITH MINERALS) TABS tablet Take 1 tablet by mouth daily.    Marland Kitchen omeprazole (PRILOSEC) 20 MG capsule Take 1 capsule (20 mg total) by mouth daily. 30 capsule 0  . ondansetron (ZOFRAN ODT) 4 MG disintegrating tablet Take 1 tablet (4 mg total) by mouth every 8 (eight) hours as needed for nausea or vomiting. 10 tablet 0  . ondansetron (ZOFRAN) 8 MG tablet Take 1 tablet (8 mg total) by mouth every 8 (eight) hours as needed for nausea or vomiting. 90 tablet 1  . oxyCODONE (ROXICODONE) 15 MG immediate release tablet Take 1 tablet (15 mg total) by mouth every 4 (four) hours as needed. 90 tablet 0  . prochlorperazine (COMPAZINE) 10 MG tablet Take 1 tablet (10 mg total) by mouth every 6 (six) hours as needed (Nausea or vomiting). 60 tablet 1  . traZODone (DESYREL) 100 MG tablet Take 1 tablet (100 mg total) by mouth at bedtime. 30 tablet 6   No current facility-administered medications for this visit.     PHYSICAL EXAMINATION: ECOG PERFORMANCE STATUS: 1 - Symptomatic but completely ambulatory  Vitals:   02/08/16 1309  BP: (!) 143/88  Pulse: 88  Resp: 18  Temp: 98 F (36.7 C)   Filed Weights   02/08/16 1309  Weight: 249 lb 9.6 oz (113.2 kg)    GENERAL:alert, no distress and comfortable SKIN: skin color, texture, turgor are normal, no rashes or significant lesions EYES: normal, Conjunctiva are pink and non-injected, sclera clear OROPHARYNX:no exudate, no erythema and lips, buccal mucosa, and tongue normal  NECK: His neck is thick and fibrosis from prior treatment. No new lymphadenopathy  LYMPH:  no palpable lymphadenopathy in the  cervical, axillary or inguinal LUNGS: clear to auscultation and percussion with normal breathing effort HEART: regular rate & rhythm and no murmurs and no lower extremity edema ABDOMEN:abdomen soft, non-tender and normal bowel sounds Musculoskeletal:no cyanosis of digits and no clubbing  NEURO: alert & oriented x 3 with fluent speech, no focal motor/sensory deficits  LABORATORY DATA:  I have reviewed the data as listed    Component Value Date/Time   NA 139 02/08/2016 1213   K 4.2 02/08/2016 1213   CL 107 07/23/2015 1513   CO2 24 02/08/2016 1213   GLUCOSE 105 02/08/2016 1213   BUN 19.5 02/08/2016 1213   CREATININE 1.6 (H) 02/08/2016 1213   CALCIUM 9.3 02/08/2016 1213   PROT 7.7 02/08/2016 1213   ALBUMIN 3.4 (L) 02/08/2016 1213   AST 24 02/08/2016 1213  ALT 18 02/08/2016 1213   ALKPHOS 88 02/08/2016 1213   BILITOT 0.35 02/08/2016 1213   GFRNONAA 44 (L) 07/23/2015 1513   GFRAA 51 (L) 07/23/2015 1513    No results found for: SPEP, UPEP  Lab Results  Component Value Date   WBC 8.2 02/08/2016   NEUTROABS 6.8 (H) 02/08/2016   HGB 12.7 (L) 02/08/2016   HCT 39.1 02/08/2016   MCV 88.9 02/08/2016   PLT 268 02/08/2016      Chemistry      Component Value Date/Time   NA 139 02/08/2016 1213   K 4.2 02/08/2016 1213   CL 107 07/23/2015 1513   CO2 24 02/08/2016 1213   BUN 19.5 02/08/2016 1213   CREATININE 1.6 (H) 02/08/2016 1213      Component Value Date/Time   CALCIUM 9.3 02/08/2016 1213   ALKPHOS 88 02/08/2016 1213   AST 24 02/08/2016 1213   ALT 18 02/08/2016 1213   BILITOT 0.35 02/08/2016 1213      ASSESSMENT & PLAN:  Tonsillar cancer Overall, he has stable disease with no signs of diffuse metastatic cancer or cancer progression. I recommend we continue same treatment for now and repeat imaging study at the end of the year  Anemia in neoplastic disease This is likely anemia of chronic disease. The patient denies recent history of bleeding such as epistaxis,  hematuria or hematochezia. He is asymptomatic from the anemia. We will observe for now.  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 His pain is intermittent in nature and I reassured the patient it is not due to cancer progression I recommend we continue the dose of oxycodone to 15 mg every 4 hours as needed for pain  Chronic kidney disease (CKD), stage II (mild) His kidney function is stable. As long as his creatinine clearance remain greater than 30, we will continue chemotherapy  Sinus congestion He has significant sinus congestion. I prescribed nasal spray for him to use as needed   No orders of the defined types were placed in this encounter.  All questions were answered. The patient knows to call the clinic with any problems, questions or concerns. No barriers to learning was detected. I spent 20 minutes counseling the patient face to face. The total time spent in the appointment was 25 minutes and more than 50% was on counseling and review of test results     [name], MD 02/08/2016 1:50 PM

## 2016-02-08 NOTE — Patient Instructions (Signed)
[facility] Discharge Instructions for Patients Receiving Chemotherapy  Today you received the following chemotherapy agents:  Keytruda.  To help prevent nausea and vomiting after your treatment, we encourage you to take your nausea medication as prescribed.   If you develop nausea and vomiting that is not controlled by your nausea medication, call the clinic.   BELOW ARE SYMPTOMS THAT SHOULD BE REPORTED IMMEDIATELY:  *FEVER GREATER THAN 100.5 F  *CHILLS WITH OR WITHOUT FEVER  NAUSEA AND VOMITING THAT IS NOT CONTROLLED WITH YOUR NAUSEA MEDICATION  *UNUSUAL SHORTNESS OF BREATH  *UNUSUAL BRUISING OR BLEEDING  TENDERNESS IN MOUTH AND THROAT WITH OR WITHOUT PRESENCE OF ULCERS  *URINARY PROBLEMS  *BOWEL PROBLEMS  UNUSUAL RASH Items with * indicate a potential emergency and should be followed up as soon as possible.  Feel free to call the clinic you have any questions or concerns. The clinic phone number is [phone].  Please show the CHEMO ALERT CARD at check-in to the Emergency Department and triage nurse.   

## 2016-02-10 ENCOUNTER — Telehealth: Payer: Self-pay | Admitting: *Deleted

## 2016-02-10 NOTE — Telephone Encounter (Signed)
CT scan scheduled for tomorrow.  Dr. Alvy Bimler requests it be moved closer to next MD appt which is requested for 11/13.    Rescheduled CT to 11/10 at 8 am.   Notified pt of CT moved to Friday 11/10 at 8 am.  Arrive to [facility] Radiology Dept at 7:45 am.   Plan to see Dr. Alvy Bimler on 11/10.  That appt has not been made yet but has been requested.   Pt verbalized understanding.

## 2016-02-11 ENCOUNTER — Ambulatory Visit (HOSPITAL_COMMUNITY): Payer: Medicaid Other

## 2016-02-15 ENCOUNTER — Telehealth: Payer: Self-pay | Admitting: Hematology and Oncology

## 2016-02-15 NOTE — Telephone Encounter (Signed)
Spoke with patient re appointment 11/13.

## 2016-02-26 ENCOUNTER — Encounter (HOSPITAL_COMMUNITY): Payer: Self-pay

## 2016-02-26 ENCOUNTER — Ambulatory Visit (HOSPITAL_COMMUNITY)
Admission: RE | Admit: 2016-02-26 | Discharge: 2016-02-26 | Disposition: A | Discharge status: Home / Self Care | Payer: Medicaid Other | Source: Ambulatory Visit | Attending: Hematology and Oncology | Admitting: Hematology and Oncology

## 2016-02-26 DIAGNOSIS — C099 Malignant neoplasm of tonsil, unspecified: Secondary | ICD-10-CM | POA: Insufficient documentation

## 2016-02-26 DIAGNOSIS — R634 Abnormal weight loss: Secondary | ICD-10-CM

## 2016-02-26 DIAGNOSIS — R918 Other nonspecific abnormal finding of lung field: Secondary | ICD-10-CM | POA: Diagnosis not present

## 2016-02-26 DIAGNOSIS — J329 Chronic sinusitis, unspecified: Secondary | ICD-10-CM | POA: Insufficient documentation

## 2016-02-29 ENCOUNTER — Ambulatory Visit: Payer: Medicaid Other

## 2016-02-29 ENCOUNTER — Ambulatory Visit (HOSPITAL_BASED_OUTPATIENT_CLINIC_OR_DEPARTMENT_OTHER): Payer: Medicaid Other | Admitting: Hematology and Oncology

## 2016-02-29 ENCOUNTER — Other Ambulatory Visit: Payer: Self-pay

## 2016-02-29 ENCOUNTER — Encounter: Payer: Self-pay | Admitting: Hematology and Oncology

## 2016-02-29 VITALS — BP 121/84 | HR 85 | Temp 98.4°F | Resp 18 | Wt 255.6 lb

## 2016-02-29 DIAGNOSIS — J984 Other disorders of lung: Secondary | ICD-10-CM | POA: Insufficient documentation

## 2016-02-29 DIAGNOSIS — J189 Pneumonia, unspecified organism: Secondary | ICD-10-CM

## 2016-02-29 DIAGNOSIS — C099 Malignant neoplasm of tonsil, unspecified: Secondary | ICD-10-CM

## 2016-02-29 DIAGNOSIS — G893 Neoplasm related pain (acute) (chronic): Secondary | ICD-10-CM

## 2016-02-29 DIAGNOSIS — T50905A Adverse effect of unspecified drugs, medicaments and biological substances, initial encounter: Secondary | ICD-10-CM

## 2016-02-29 MED ORDER — OXYCODONE HCL 15 MG PO TABS: 15.0000 mg | ORAL_TABLET | ORAL | 0 refills | Status: DC | PRN

## 2016-02-29 MED ORDER — PREDNISONE 20 MG PO TABS: 60.0000 mg | ORAL_TABLET | Freq: Every day | ORAL | 2 refills | Status: DC

## 2016-02-29 MED FILL — oxyCODONE HCL 15 MG TABS: 15 | 15 days supply | Qty: 90 | Fill #0

## 2016-02-29 MED FILL — predniSONE 20 MG TABS: 20 | 30 days supply | Qty: 90 | Fill #0

## 2016-02-29 NOTE — Assessment & Plan Note (Signed)
The patient has cancer associated pain. His pain is intermittent in nature and I reassured the patient it is not due to cancer progression I recommend we continue the dose of oxycodone to 15 mg every 4 hours as needed for pain 

## 2016-02-29 NOTE — Progress Notes (Signed)
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 FNA of right level III lymph node was inconclusive for cancer      07/25/2013 Imaging    PET scan showed locally advanced disease with abnormal lymphadenopathy in the right axilla      08/06/2013 Procedure    CT-guided biopsy of the lymphadenopathy was negative for malignancy      08/15/2013 Surgery    Patient has placement of port and feeding tube      08/19/2013 - 09/10/2013 Chemotherapy    Patient received chemotherapy with cisplatin. The patient only received 2 doses due to uncontrolled nausea and acute renal failure.      08/19/2013 - 10/15/2013 Radiation Therapy    Received Helical IMRT Tomotherapy:  Right Tonstil and bilateral neck / 70 Gy in 35 fractions to gross disease, 63 Gy in 35 fractions to high risk nodal echelons, and 56 Gy in 35 fractions to intermediate risk nodal echelons.      08/27/2013 - 08/30/2013 Hospital Admission    The patient was admitted to the hospital for uncontrolled nausea vomiting and dehydration.      02/14/2014 Imaging    PET/CT scan showed complete response to treatment      03/19/2014 Surgery    He had excisional lymph node biopsy from the right neck.  Pathology was benign      05/13/2014 Surgery    He had removal of Port-A-Cath.      05/22/2014 Imaging    Repeat CT scan of the neck show no evidence of disease recurrence.      12/09/2014 Imaging    Ct neck without contrast showed persistent abnormalities on the right side of neck, indeterminate      12/25/2014 Imaging    PET CT scan showed disease recurrence.      02/10/2015 Procedure    He has placement of port      02/13/2015 - 07/13/2015 Chemotherapy    He received chemotherapy with carbo/Taxol      04/21/2015 Imaging    PET CT scan showed improved disease control      08/04/2015 Imaging    PET scan showed persistent disease      08/17/2015 -  Chemotherapy    He was started with [facility]      10/19/2015 Imaging    Ct neck showed mass-like intermediate density soft tissue at the right lateral neck recurrence site stable.       02/26/2016 Imaging    CT neck showed unchanged appearance of right neck recurrence compared to 10/19/2015 CT. No noncontrast evidence of new metastatic disease in the neck. Chronic sinusitis, progressed.      02/26/2016 Imaging    Diffuse but patchy and asymmetric partial airspace filling process (  ground-glass opacity) in the lungs. This could be due to respiratory bronchiolitis, hypersensitivity pneumonitis or possible drug reaction. Atypical/viral pneumonia is also possible. Pulmonary consultation may be a helpful. A three-month follow-up noncontrast chest CT is suggested. Slight interval enlargement of mediastinal lymph nodes and a small lymph node along the left major fissure. This is most likely due to the inflammatory process in the lungs. No findings for metastatic disease involving the chest. No findings for upper abdominal metastatic disease.      02/29/2016 Adverse Reaction    His treatment is placed on hold due to possible hypersensitivity pneumonitis/drug reaction       INTERVAL HISTORY: Please see below for problem oriented  charting. He is seen today with his wife. He complained of some cough nonproductive. He had some recent sinusitis successfully treated with a course of antibiotics He continues to have pain and take oxycodone at night for his neck pain Denies recent nausea or vomiting  REVIEW OF SYSTEMS:   Constitutional: Denies fevers, chills or abnormal weight loss Eyes: Denies blurriness of vision Ears, nose, mouth, throat, and face: Denies mucositis or sore throat Cardiovascular: Denies palpitation, chest discomfort or lower extremity swelling Gastrointestinal:  Denies nausea, heartburn or change in bowel habits Skin: Denies abnormal skin rashes Lymphatics: Denies new lymphadenopathy or easy bruising Neurological:Denies numbness, tingling or new weaknesses Behavioral/Psych: Mood is stable, no new changes  All other systems were reviewed with the patient and are negative.  I have reviewed the past medical history, past surgical history, social history and family history with the patient and they are unchanged from previous note.  ALLERGIES:  is allergic to pollen extract.  MEDICATIONS:  Current Outpatient Prescriptions  Medication Sig Dispense Refill  . fluticasone (FLONASE) 50 MCG/ACT nasal spray Place 2 sprays into both nostrils daily. 16 g 2  . lidocaine-prilocaine (EMLA) cream Apply 1 application topically as needed. 30 g 6  . Multiple Vitamin (MULTIVITAMIN WITH MINERALS) TABS tablet Take 1 tablet by mouth daily.    Marland Kitchen omeprazole (PRILOSEC) 20 MG capsule Take 1 capsule (20 mg total) by mouth daily. 30 capsule 0  . ondansetron (ZOFRAN ODT) 4 MG disintegrating tablet Take 1 tablet (4 mg total) by mouth every 8 (eight) hours as needed for nausea or vomiting. 10 tablet 0  . ondansetron (ZOFRAN) 8 MG tablet Take 1 tablet (8 mg total) by mouth every 8 (eight) hours as needed for nausea or vomiting. 90 tablet 1  . oxyCODONE (ROXICODONE) 15 MG immediate release tablet Take 1 tablet (15 mg total) by  mouth every 4 (four) hours as needed. 90 tablet 0  . prochlorperazine (COMPAZINE) 10 MG tablet Take 1 tablet (10 mg total) by mouth every 6 (six) hours as needed (Nausea or vomiting). 60 tablet 1  . traZODone (DESYREL) 100 MG tablet Take 1 tablet (100 mg total) by mouth at bedtime. 30 tablet 6  . predniSONE (DELTASONE) 20 MG tablet Take 3 tablets (60 mg total) by mouth daily with breakfast. 90 tablet 2   No current facility-administered medications for this visit.     PHYSICAL EXAMINATION: ECOG PERFORMANCE STATUS: 1 - Symptomatic but completely ambulatory  Vitals:   02/29/16 1048  BP: 121/84  Pulse: 85  Resp: 18  Temp: 98.4 F (36.9 C)   Filed Weights   02/29/16 1048  Weight: 255 lb 9.6 oz (115.9 kg)    GENERAL:alert, no distress and comfortable SKIN: skin color, texture, turgor are normal, no rashes or significant lesions EYES: normal, Conjunctiva  are pink and non-injected, sclera clear OROPHARYNX:no exudate, no erythema and lips, buccal mucosa, and tongue normal  NECK: supple, thyroid normal size, non-tender, without nodularity LYMPH:  no palpable lymphadenopathy in the cervical, axillary or inguinal LUNGS: Noted bilateral lung crackles with normal breathing effort HEART: regular rate & rhythm and no murmurs and no lower extremity edema ABDOMEN:abdomen soft, non-tender and normal bowel sounds Musculoskeletal:no cyanosis of digits and no clubbing  NEURO: alert & oriented x 3 with fluent speech, no focal motor/sensory deficits  LABORATORY DATA:  I have reviewed the data as listed    Component Value Date/Time   NA 139 02/08/2016 1213   K 4.2 02/08/2016 1213   CL 107 07/23/2015 1513   CO2 24 02/08/2016 1213   GLUCOSE 105 02/08/2016 1213   BUN 19.5 02/08/2016 1213   CREATININE 1.6 (H) 02/08/2016 1213   CALCIUM 9.3 02/08/2016 1213   PROT 7.7 02/08/2016 1213   ALBUMIN 3.4 (L) 02/08/2016 1213   AST 24 02/08/2016 1213   ALT 18 02/08/2016 1213   ALKPHOS 88 02/08/2016 1213    BILITOT 0.35 02/08/2016 1213   GFRNONAA 44 (L) 07/23/2015 1513   GFRAA 51 (L) 07/23/2015 1513    No results found for: SPEP, UPEP  Lab Results  Component Value Date   WBC 8.2 02/08/2016   NEUTROABS 6.8 (H) 02/08/2016   HGB 12.7 (L) 02/08/2016   HCT 39.1 02/08/2016   MCV 88.9 02/08/2016   PLT 268 02/08/2016      Chemistry      Component Value Date/Time   NA 139 02/08/2016 1213   K 4.2 02/08/2016 1213   CL 107 07/23/2015 1513   CO2 24 02/08/2016 1213   BUN 19.5 02/08/2016 1213   CREATININE 1.6 (H) 02/08/2016 1213      Component Value Date/Time   CALCIUM 9.3 02/08/2016 1213   ALKPHOS 88 02/08/2016 1213   AST 24 02/08/2016 1213   ALT 18 02/08/2016 1213   BILITOT 0.35 02/08/2016 1213     I reviewed recent CT scan result with him and his wife  ASSESSMENT & PLAN:  Tonsillar cancer I reviewed the results of the CT and imaging study with the patient and his wife. He has stable disease while on treatment but imaging studies of his lungs are abnormal; could be infectious versus inflammatory versus drug induced pneumonitis He just completed a course of antibiotic recently for sinusitis He is symptomatic with cough and shortness of breath I plan to hold his immunotherapy now and start him on high-dose prednisone therapy at 60 mg daily. Approximately 10 days from now, he will reduce it to 40 mg until I see him back.  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 His pain is intermittent in nature and I reassured the patient it is not due to cancer progression I recommend we continue the dose of oxycodone to 15 mg every 4 hours as needed for pain  Pneumonitis Imaging study of his lungs are abnormal, could be due to drug induced pneumonitis versus inflammatory versus infection I plan to sign him on prednisone therapy and hold treatment as above. He agreed with the plan of care. I will see him back in 4 weeks for further assessment   No orders of the  defined types were placed in this encounter.  All questions were answered. The patient knows to call the clinic with any problems, questions or concerns. No barriers to learning was detected. I spent 20 minutes counseling the patient  face to face. The total time spent in the appointment was 25 minutes and more than 50% was on counseling and review of test results     [name], MD 02/29/2016 1:01 PM

## 2016-02-29 NOTE — Assessment & Plan Note (Signed)
I reviewed the results of the CT and imaging study with the patient and his wife. He has stable disease while on treatment but imaging studies of his lungs are abnormal; could be infectious versus inflammatory versus drug induced pneumonitis He just completed a course of antibiotic recently for sinusitis He is symptomatic with cough and shortness of breath I plan to hold his immunotherapy now and start him on high-dose prednisone therapy at 60 mg daily. Approximately 10 days from now, he will reduce it to 40 mg until I see him back.

## 2016-02-29 NOTE — Assessment & Plan Note (Signed)
Imaging study of his lungs are abnormal, could be due to drug induced pneumonitis versus inflammatory versus infection I plan to sign him on prednisone therapy and hold treatment as above. He agreed with the plan of care. I will see him back in 4 weeks for further assessment

## 2016-03-04 ENCOUNTER — Encounter: Payer: Self-pay | Admitting: *Deleted

## 2016-03-04 NOTE — Progress Notes (Signed)
Oncology Nurse Navigator Documentation  Met with Mr. Mason [name] during Established Patient appt with Dr. Alvy Bimler.  He was accompanied by his wife. He voiced understanding of Dr. Calton Dach discussion of 11/10 CT Chest, questions re presence of lung infection/inflammation, plan for prednisone over next several weeks, cancellation of today's chemo. He did not express any needs or concerns at this time, I encouraged him to contact me if that changes, he verbalized understanding.  Gayleen Orem, RN, BSN, [place] [facility] [place] [phone]

## 2016-03-11 ENCOUNTER — Telehealth: Payer: Self-pay | Admitting: Hematology and Oncology

## 2016-03-11 NOTE — Telephone Encounter (Signed)
Spoke with patient re next appointment for 12/11.

## 2016-03-28 ENCOUNTER — Ambulatory Visit (HOSPITAL_BASED_OUTPATIENT_CLINIC_OR_DEPARTMENT_OTHER): Payer: Medicaid Other | Admitting: Hematology and Oncology

## 2016-03-28 ENCOUNTER — Encounter: Payer: Self-pay | Admitting: Hematology and Oncology

## 2016-03-28 ENCOUNTER — Other Ambulatory Visit (HOSPITAL_BASED_OUTPATIENT_CLINIC_OR_DEPARTMENT_OTHER): Payer: Medicaid Other

## 2016-03-28 ENCOUNTER — Telehealth: Payer: Self-pay | Admitting: Hematology and Oncology

## 2016-03-28 VITALS — BP 142/80 | HR 90 | Temp 98.1°F | Resp 18 | Ht 79.0 in | Wt 267.3 lb

## 2016-03-28 DIAGNOSIS — Z79899 Other long term (current) drug therapy: Secondary | ICD-10-CM | POA: Diagnosis not present

## 2016-03-28 DIAGNOSIS — C099 Malignant neoplasm of tonsil, unspecified: Secondary | ICD-10-CM

## 2016-03-28 DIAGNOSIS — N182 Chronic kidney disease, stage 2 (mild): Secondary | ICD-10-CM | POA: Diagnosis not present

## 2016-03-28 DIAGNOSIS — J189 Pneumonia, unspecified organism: Secondary | ICD-10-CM | POA: Diagnosis present

## 2016-03-28 DIAGNOSIS — D630 Anemia in neoplastic disease: Secondary | ICD-10-CM

## 2016-03-28 DIAGNOSIS — G893 Neoplasm related pain (acute) (chronic): Secondary | ICD-10-CM

## 2016-03-28 LAB — TSH: TSH: 0.736 m[IU]/L (ref 0.320–4.118)

## 2016-03-28 LAB — CBC WITH DIFFERENTIAL/PLATELET
BASO%: 0.5 % (ref 0.0–2.0)
Basophils Absolute: 0.1 10*3/uL (ref 0.0–0.1)
EOS%: 0.6 % (ref 0.0–7.0)
Eosinophils Absolute: 0.1 10*3/uL (ref 0.0–0.5)
HEMATOCRIT: 40 % (ref 38.4–49.9)
HGB: 12.6 g/dL — ABNORMAL LOW (ref 13.0–17.1)
LYMPH#: 0.9 10*3/uL (ref 0.9–3.3)
LYMPH%: 8.7 % — AB (ref 14.0–49.0)
MCH: 28.4 pg (ref 27.2–33.4)
MCHC: 31.6 g/dL — AB (ref 32.0–36.0)
MCV: 90.1 fL (ref 79.3–98.0)
MONO#: 0.6 10*3/uL (ref 0.1–0.9)
MONO%: 6.1 % (ref 0.0–14.0)
NEUT#: 8.4 10*3/uL — ABNORMAL HIGH (ref 1.5–6.5)
NEUT%: 84.1 % — AB (ref 39.0–75.0)
Platelets: 187 10*3/uL (ref 140–400)
RBC: 4.45 10*6/uL (ref 4.20–5.82)
RDW: 15.8 % — ABNORMAL HIGH (ref 11.0–14.6)
WBC: 10 10*3/uL (ref 4.0–10.3)

## 2016-03-28 LAB — COMPREHENSIVE METABOLIC PANEL
ALT: 21 U/L (ref 0–55)
AST: 16 U/L (ref 5–34)
Albumin: 3.6 g/dL (ref 3.5–5.0)
Alkaline Phosphatase: 56 U/L (ref 40–150)
Anion Gap: 11 mEq/L (ref 3–11)
BUN: 21.8 mg/dL (ref 7.0–26.0)
CHLORIDE: 105 meq/L (ref 98–109)
CO2: 25 meq/L (ref 22–29)
CREATININE: 1.9 mg/dL — AB (ref 0.7–1.3)
Calcium: 9.3 mg/dL (ref 8.4–10.4)
EGFR: 48 mL/min/{1.73_m2} — ABNORMAL LOW (ref >90)
GLUCOSE: 92 mg/dL (ref 70–140)
Potassium: 3.9 mEq/L (ref 3.5–5.1)
SODIUM: 141 meq/L (ref 136–145)
Total Bilirubin: 0.36 mg/dL (ref 0.20–1.20)
Total Protein: 6.9 g/dL (ref 6.4–8.3)

## 2016-03-28 MED ORDER — OXYCODONE HCL 15 MG PO TABS
15.0000 mg | ORAL_TABLET | ORAL | 0 refills | Status: DC | PRN
Start: 2016-03-28 — End: 2016-04-22

## 2016-03-28 MED ORDER — PREDNISONE 10 MG PO TABS: 20.0000 mg | ORAL_TABLET | Freq: Every day | ORAL | 0 refills | Status: DC

## 2016-03-28 MED FILL — oxyCODONE HCL 15 MG TABS: 15 | 15 days supply | Qty: 90 | Fill #0

## 2016-03-28 MED FILL — predniSONE 10 MG TABS: 10 | 20 days supply | Qty: 40 | Fill #0

## 2016-03-28 NOTE — Assessment & Plan Note (Signed)
His treatment is placed on hold due to pneumonitis. He is currently taking prednisone. I will initiate prednisone taper. I plan to repeat CT scan in 3 weeks and resume treatment if pneumonitis resolved

## 2016-03-28 NOTE — Assessment & Plan Note (Signed)
The patient has cancer associated pain. His pain is intermittent in nature and I reassured the patient it is not due to cancer progression I recommend we continue the dose of oxycodone to 15 mg every 4 hours as needed for pain 

## 2016-03-28 NOTE — Progress Notes (Signed)
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 FNA of right level III lymph node was inconclusive for cancer      07/25/2013 Imaging    PET scan showed locally advanced disease with abnormal lymphadenopathy in the right axilla      08/06/2013 Procedure    CT-guided biopsy of the lymphadenopathy was negative for malignancy      08/15/2013 Surgery    Patient has placement of port and feeding tube      08/19/2013 - 09/10/2013 Chemotherapy    Patient received chemotherapy with cisplatin. The patient only received 2 doses due to uncontrolled nausea and acute renal failure.      08/19/2013 - 10/15/2013 Radiation Therapy    Received Helical IMRT Tomotherapy:  Right Tonstil and bilateral neck / 70 Gy in 35 fractions to gross disease, 63 Gy in 35 fractions to high risk nodal echelons, and 56 Gy in 35 fractions to intermediate risk nodal echelons.      08/27/2013 - 08/30/2013 Hospital Admission    The patient was admitted to the hospital for uncontrolled nausea vomiting and dehydration.      02/14/2014 Imaging    PET/CT scan showed complete response to treatment      03/19/2014 Surgery    He had excisional lymph node biopsy from the right neck. Pathology was benign      05/13/2014 Surgery    He had  removal of Port-A-Cath.      05/22/2014 Imaging    Repeat CT scan of the neck show no evidence of disease recurrence.      12/09/2014 Imaging    Ct neck without contrast showed persistent abnormalities on the right side of neck, indeterminate      12/25/2014 Imaging    PET CT scan showed disease recurrence.      02/10/2015 Procedure    He has placement of port      02/13/2015 - 07/13/2015 Chemotherapy    He received chemotherapy with carbo/Taxol      04/21/2015 Imaging    PET CT scan showed improved disease control      08/04/2015 Imaging    PET scan showed persistent disease      08/17/2015 -  Chemotherapy    He was started with [facility]      10/19/2015 Imaging    Ct neck showed mass-like intermediate density soft tissue at the right lateral neck recurrence site stable.       02/26/2016 Imaging    CT neck showed unchanged appearance of right neck recurrence compared to 10/19/2015 CT. No noncontrast evidence of new metastatic disease in the neck. Chronic sinusitis, progressed.      02/26/2016 Imaging    Diffuse but patchy and asymmetric partial airspace filling process (ground-glass opacity) in the lungs. This could be due  to respiratory bronchiolitis, hypersensitivity pneumonitis or possible drug reaction. Atypical/viral pneumonia is also possible. Pulmonary consultation may be a helpful. A three-month follow-up noncontrast chest CT is suggested. Slight interval enlargement of mediastinal lymph nodes and a small lymph node along the left major fissure. This is most likely due to the inflammatory process in the lungs. No findings for metastatic disease involving the chest. No findings for upper abdominal metastatic disease.      02/29/2016 Adverse Reaction    His treatment is placed on hold due to possible hypersensitivity pneumonitis/drug reaction       INTERVAL HISTORY: Please see below for problem oriented charting. He is doing well. Denies chest pain, cough or shortness  of breath. He has gained a lot of weight from prednisone. He continues to have right neck pain. No new lymphadenopathy  REVIEW OF SYSTEMS:   Constitutional: Denies fevers, chills or abnormal weight loss Eyes: Denies blurriness of vision Ears, nose, mouth, throat, and face: Denies mucositis or sore throat Respiratory: Denies cough, dyspnea or wheezes Cardiovascular: Denies palpitation, chest discomfort or lower extremity swelling Gastrointestinal:  Denies nausea, heartburn or change in bowel habits Skin: Denies abnormal skin rashes Lymphatics: Denies new lymphadenopathy or easy bruising Neurological:Denies numbness, tingling or new weaknesses Behavioral/Psych: Mood is stable, no new changes  All other systems were reviewed with the patient and are negative.  I have reviewed the past medical history, past surgical history, social history and family history with the patient and they are unchanged from previous note.  ALLERGIES:  is allergic to pollen extract.  MEDICATIONS:  Current Outpatient Prescriptions  Medication Sig Dispense Refill  . fluticasone (FLONASE) 50 MCG/ACT nasal spray Place 2 sprays into both nostrils daily. 16 g 2  . lidocaine-prilocaine (EMLA) cream Apply 1 application topically as needed. 30 g 6  . Multiple Vitamin (MULTIVITAMIN WITH MINERALS) TABS tablet Take 1 tablet by mouth daily.    Marland Kitchen omeprazole (PRILOSEC) 20 MG capsule Take 1 capsule (20 mg total) by mouth daily. 30 capsule 0  . ondansetron (ZOFRAN ODT) 4 MG disintegrating tablet Take 1 tablet (4 mg total) by mouth every 8 (eight) hours as needed for nausea or vomiting. 10 tablet 0  . ondansetron (ZOFRAN) 8 MG tablet Take 1 tablet (8 mg total) by mouth every 8 (eight) hours as needed for nausea or vomiting. 90 tablet 1  . oxyCODONE (ROXICODONE) 15 MG immediate release tablet Take 1 tablet (15 mg total) by mouth every 4 (four) hours as needed. 90 tablet 0  . predniSONE (DELTASONE) 10 MG tablet Take 2 tablets (20  mg total) by mouth daily with breakfast. 40 tablet 0  . prochlorperazine (COMPAZINE) 10 MG tablet Take 1 tablet (10 mg total) by mouth every 6 (six) hours as needed (Nausea or vomiting). 60 tablet 1  . traZODone (DESYREL) 100 MG tablet Take 1 tablet (100 mg total) by mouth at bedtime. 30 tablet 6   No current facility-administered medications for this visit.     PHYSICAL EXAMINATION: ECOG PERFORMANCE STATUS: 1 - Symptomatic but completely ambulatory  Vitals:   03/28/16 1335  BP: (!) 142/80  Pulse: 90  Resp: 18  Temp: 98.1 F (36.7 C)   Filed Weights   03/28/16 1335  Weight: 267 lb 4.8 oz (121.2 kg)    GENERAL:alert, no distress and comfortable SKIN: skin color, texture, turgor are normal, no rashes or significant lesions EYES: normal, Conjunctiva are pink and non-injected, sclera clear OROPHARYNX:no exudate, no erythema and lips, buccal mucosa, and  tongue normal  NECK: Well-healed surgical scar. LYMPH:  no palpable lymphadenopathy in the cervical, axillary or inguinal LUNGS: clear to auscultation and percussion with normal breathing effort HEART: regular rate & rhythm and no murmurs and no lower extremity edema ABDOMEN:abdomen soft, non-tender and normal bowel sounds Musculoskeletal:no cyanosis of digits and no clubbing  NEURO: alert & oriented x 3 with fluent speech, no focal motor/sensory deficits  LABORATORY DATA:  I have reviewed the data as listed    Component Value Date/Time   NA 141 03/28/2016 1311   K 3.9 03/28/2016 1311   CL 107 07/23/2015 1513   CO2 25 03/28/2016 1311   GLUCOSE 92 03/28/2016 1311   BUN 21.8 03/28/2016 1311   CREATININE 1.9 (H) 03/28/2016 1311   CALCIUM 9.3 03/28/2016 1311   PROT 6.9 03/28/2016 1311   ALBUMIN 3.6 03/28/2016 1311   AST 16 03/28/2016 1311   ALT 21 03/28/2016 1311   ALKPHOS 56 03/28/2016 1311   BILITOT 0.36 03/28/2016 1311   GFRNONAA 44 (L) 07/23/2015 1513   GFRAA 51 (L) 07/23/2015 1513    No results found for: SPEP,  UPEP  Lab Results  Component Value Date   WBC 10.0 03/28/2016   NEUTROABS 8.4 (H) 03/28/2016   HGB 12.6 (L) 03/28/2016   HCT 40.0 03/28/2016   MCV 90.1 03/28/2016   PLT 187 03/28/2016      Chemistry      Component Value Date/Time   NA 141 03/28/2016 1311   K 3.9 03/28/2016 1311   CL 107 07/23/2015 1513   CO2 25 03/28/2016 1311   BUN 21.8 03/28/2016 1311   CREATININE 1.9 (H) 03/28/2016 1311      Component Value Date/Time   CALCIUM 9.3 03/28/2016 1311   ALKPHOS 56 03/28/2016 1311   AST 16 03/28/2016 1311   ALT 21 03/28/2016 1311   BILITOT 0.36 03/28/2016 1311      ASSESSMENT & PLAN:  Tonsillar cancer His treatment is placed on hold due to pneumonitis. He is currently taking prednisone. I will initiate prednisone taper. I plan to repeat CT scan in 3 weeks and resume treatment if pneumonitis resolved  Anemia in neoplastic disease This is likely anemia of chronic disease. The patient denies recent history of bleeding such as epistaxis, hematuria or hematochezia. He is asymptomatic from the anemia. We will observe for now.  Chronic kidney disease (CKD), stage II (mild) His kidney function is stable. As long as his creatinine clearance remain greater than 30, we will continue chemotherapy Monitor only  Cancer associated pain The patient has cancer associated pain. His pain is intermittent in nature and I reassured the patient it is not due to cancer progression I recommend we continue the dose of oxycodone to 15 mg every 4 hours as needed for pain  Pneumonitis Imaging study of his lungs are abnormal, could be due to drug induced pneumonitis versus inflammatory versus infection He is on prednisone therapy and hold treatment as above. He agreed with the plan of care. I will see him back next month with repeat CT imaging. He will complete prednisone taper by 04/18/2016   Orders Placed This Encounter  Procedures  . CT CHEST W CONTRAST    Standing Status:   Future     Standing Expiration Date:   05/02/2017    Order Specific Question:   Reason for exam:    Answer:   pneumonitis due to chemo, check for resolution    Order Specific Question:   Preferred imaging  location?    Answer:   [facility]   All questions were answered. The patient knows to call the clinic with any problems, questions or concerns. No barriers to learning was detected. I spent 20 minutes counseling the patient face to face. The total time spent in the appointment was 25 minutes and more than 50% was on counseling and review of test results     [name], MD 03/28/2016 1:52 PM

## 2016-03-28 NOTE — Assessment & Plan Note (Signed)
This is likely anemia of chronic disease. The patient denies recent history of bleeding such as epistaxis, hematuria or hematochezia. He is asymptomatic from the anemia. We will observe for now.  

## 2016-03-28 NOTE — Assessment & Plan Note (Signed)
His kidney function is stable. As long as his creatinine clearance remain greater than 30, we will continue chemotherapy Monitor only 

## 2016-03-28 NOTE — Telephone Encounter (Signed)
Message sent to chemo scheduler to be added. Appointments scheduled per 03/28/16 los. A copy of the appointment schedule was given to the patient, per 03/28/16 los.

## 2016-03-28 NOTE — Assessment & Plan Note (Signed)
Imaging study of his lungs are abnormal, could be due to drug induced pneumonitis versus inflammatory versus infection He is on prednisone therapy and hold treatment as above. He agreed with the plan of care. I will see him back next month with repeat CT imaging. He will complete prednisone taper by 04/18/2016

## 2016-03-30 ENCOUNTER — Telehealth: Payer: Self-pay | Admitting: *Deleted

## 2016-03-30 NOTE — Telephone Encounter (Signed)
Per LOS I have scheduled appts and notified the scheduler 

## 2016-04-15 ENCOUNTER — Ambulatory Visit (HOSPITAL_COMMUNITY)
Admission: RE | Admit: 2016-04-15 | Discharge: 2016-04-15 | Disposition: A | Discharge status: Home / Self Care | Payer: Medicaid Other | Source: Ambulatory Visit | Attending: Hematology and Oncology | Admitting: Hematology and Oncology

## 2016-04-15 ENCOUNTER — Encounter (HOSPITAL_COMMUNITY): Payer: Self-pay

## 2016-04-15 DIAGNOSIS — C099 Malignant neoplasm of tonsil, unspecified: Secondary | ICD-10-CM | POA: Diagnosis not present

## 2016-04-15 DIAGNOSIS — J189 Pneumonia, unspecified organism: Secondary | ICD-10-CM | POA: Diagnosis present

## 2016-04-21 ENCOUNTER — Other Ambulatory Visit (HOSPITAL_BASED_OUTPATIENT_CLINIC_OR_DEPARTMENT_OTHER): Payer: Medicaid Other

## 2016-04-21 ENCOUNTER — Ambulatory Visit (HOSPITAL_BASED_OUTPATIENT_CLINIC_OR_DEPARTMENT_OTHER): Payer: Medicaid Other

## 2016-04-21 DIAGNOSIS — Z79899 Other long term (current) drug therapy: Secondary | ICD-10-CM | POA: Diagnosis not present

## 2016-04-21 DIAGNOSIS — C099 Malignant neoplasm of tonsil, unspecified: Secondary | ICD-10-CM

## 2016-04-21 LAB — COMPREHENSIVE METABOLIC PANEL
ALBUMIN: 4 g/dL (ref 3.5–5.0)
ALK PHOS: 65 U/L (ref 40–150)
ALT: 12 U/L (ref 0–55)
AST: 15 U/L (ref 5–34)
Anion Gap: 11 mEq/L (ref 3–11)
BUN: 18 mg/dL (ref 7.0–26.0)
CO2: 24 mEq/L (ref 22–29)
Calcium: 9.5 mg/dL (ref 8.4–10.4)
Chloride: 106 mEq/L (ref 98–109)
Creatinine: 1.7 mg/dL — ABNORMAL HIGH (ref 0.7–1.3)
EGFR: 52 mL/min/{1.73_m2} — ABNORMAL LOW (ref >90)
Glucose: 95 mg/dl (ref 70–140)
POTASSIUM: 3.8 meq/L (ref 3.5–5.1)
SODIUM: 142 meq/L (ref 136–145)
TOTAL PROTEIN: 7 g/dL (ref 6.4–8.3)
Total Bilirubin: 0.29 mg/dL (ref 0.20–1.20)

## 2016-04-21 LAB — CBC WITH DIFFERENTIAL/PLATELET
BASO%: 0.4 % (ref 0.0–2.0)
Basophils Absolute: 0 10*3/uL (ref 0.0–0.1)
EOS%: 0.6 % (ref 0.0–7.0)
Eosinophils Absolute: 0.1 10*3/uL (ref 0.0–0.5)
HCT: 39.3 % (ref 38.4–49.9)
HEMOGLOBIN: 12.7 g/dL — AB (ref 13.0–17.1)
LYMPH%: 12 % — ABNORMAL LOW (ref 14.0–49.0)
MCH: 28.7 pg (ref 27.2–33.4)
MCHC: 32.3 g/dL (ref 32.0–36.0)
MCV: 89.1 fL (ref 79.3–98.0)
MONO#: 0.6 10*3/uL (ref 0.1–0.9)
MONO%: 6.6 % (ref 0.0–14.0)
NEUT%: 80.4 % — ABNORMAL HIGH (ref 39.0–75.0)
NEUTROS ABS: 7.9 10*3/uL — AB (ref 1.5–6.5)
Platelets: 230 10*3/uL (ref 140–400)
RBC: 4.41 10*6/uL (ref 4.20–5.82)
RDW: 15.8 % — AB (ref 11.0–14.6)
WBC: 9.8 10*3/uL (ref 4.0–10.3)
lymph#: 1.2 10*3/uL (ref 0.9–3.3)

## 2016-04-21 LAB — TSH: TSH: 1.309 m(IU)/L (ref 0.320–4.118)

## 2016-04-21 MED ORDER — HEPARIN SOD (PORK) LOCK FLUSH 100 UNIT/ML IV SOLN
500.0000 [IU] | Freq: Once | INTRAVENOUS | Status: AC | PRN
  Administered 2016-04-21: 500 [IU] via INTRAVENOUS
  Filled 2016-04-21: qty 5

## 2016-04-21 MED ORDER — SODIUM CHLORIDE 0.9 % IJ SOLN
10.0000 mL | INTRAMUSCULAR | Status: DC | PRN
  Administered 2016-04-21: 10 mL via INTRAVENOUS
  Filled 2016-04-21: qty 10

## 2016-04-22 ENCOUNTER — Ambulatory Visit (HOSPITAL_BASED_OUTPATIENT_CLINIC_OR_DEPARTMENT_OTHER): Payer: Medicaid Other

## 2016-04-22 ENCOUNTER — Ambulatory Visit (HOSPITAL_BASED_OUTPATIENT_CLINIC_OR_DEPARTMENT_OTHER): Payer: Medicaid Other | Admitting: Hematology and Oncology

## 2016-04-22 ENCOUNTER — Encounter: Payer: Self-pay | Admitting: Hematology and Oncology

## 2016-04-22 DIAGNOSIS — C099 Malignant neoplasm of tonsil, unspecified: Secondary | ICD-10-CM

## 2016-04-22 DIAGNOSIS — G893 Neoplasm related pain (acute) (chronic): Secondary | ICD-10-CM

## 2016-04-22 DIAGNOSIS — N182 Chronic kidney disease, stage 2 (mild): Secondary | ICD-10-CM

## 2016-04-22 DIAGNOSIS — Z5112 Encounter for antineoplastic immunotherapy: Secondary | ICD-10-CM | POA: Diagnosis not present

## 2016-04-22 MED ORDER — SODIUM CHLORIDE 0.9% FLUSH
10.0000 mL | INTRAVENOUS | Status: DC | PRN
  Administered 2016-04-22: 10 mL
  Filled 2016-04-22: qty 10

## 2016-04-22 MED ORDER — HEPARIN SOD (PORK) LOCK FLUSH 100 UNIT/ML IV SOLN
500.0000 [IU] | Freq: Once | INTRAVENOUS | Status: AC | PRN
  Administered 2016-04-22: 500 [IU]
  Filled 2016-04-22: qty 5

## 2016-04-22 MED ORDER — OXYCODONE HCL 15 MG PO TABS: 15.0000 mg | ORAL_TABLET | ORAL | 0 refills | Status: DC | PRN

## 2016-04-22 MED ORDER — SODIUM CHLORIDE 0.9 % IV SOLN
Freq: Once | INTRAVENOUS | Status: AC
  Administered 2016-04-22: 12:00:00 via INTRAVENOUS

## 2016-04-22 MED ORDER — SODIUM CHLORIDE 0.9 % IV SOLN
200.0000 mg | Freq: Once | INTRAVENOUS | Status: AC
  Administered 2016-04-22: 200 mg via INTRAVENOUS
  Filled 2016-04-22: qty 8

## 2016-04-22 MED FILL — oxyCODONE HCL 15 MG TABS: 15 | 15 days supply | Qty: 90 | Fill #0

## 2016-04-22 NOTE — Progress Notes (Signed)
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 FNA of right level III lymph node was inconclusive for cancer      07/25/2013 Imaging    PET scan showed locally advanced disease with abnormal lymphadenopathy in the right axilla      08/06/2013 Procedure    CT-guided biopsy of the lymphadenopathy was negative for malignancy      08/15/2013 Surgery    Patient has placement of port and feeding tube      08/19/2013 - 09/10/2013 Chemotherapy    Patient received chemotherapy with cisplatin. The patient only received 2 doses due to uncontrolled nausea and acute renal failure.      08/19/2013 - 10/15/2013 Radiation Therapy    Received Helical IMRT Tomotherapy:  Right Tonstil and bilateral neck / 70 Gy in 35 fractions to gross disease, 63 Gy in 35 fractions to high risk nodal echelons, and 56 Gy in 35 fractions to intermediate risk nodal echelons.      08/27/2013 - 08/30/2013 Hospital Admission    The patient was admitted to the hospital for uncontrolled nausea vomiting and dehydration.      02/14/2014 Imaging    PET/CT scan showed complete response to treatment      03/19/2014 Surgery    He had excisional lymph node biopsy from the right neck. Pathology was benign      05/13/2014 Surgery    He had  removal of Port-A-Cath.      05/22/2014 Imaging    Repeat CT scan of the neck show no evidence of disease recurrence.      12/09/2014 Imaging    Ct neck without contrast showed persistent abnormalities on the right side of neck, indeterminate      12/25/2014 Imaging    PET CT scan showed disease recurrence.      02/10/2015 Procedure    He has placement of port      02/13/2015 - 07/13/2015 Chemotherapy    He received chemotherapy with carbo/Taxol      04/21/2015 Imaging    PET CT scan showed improved disease control      08/04/2015 Imaging    PET scan showed persistent disease      08/17/2015 -  Chemotherapy    He was started with [facility]      10/19/2015 Imaging    Ct neck showed mass-like intermediate density soft tissue at the right lateral neck recurrence site stable.       02/26/2016 Imaging    CT neck showed unchanged appearance of right neck recurrence compared to 10/19/2015 CT. No noncontrast evidence of new metastatic disease in the neck. Chronic sinusitis, progressed.      02/26/2016 Imaging    Diffuse but patchy and asymmetric partial airspace filling process (ground-glass opacity) in the lungs. This could be due  to respiratory bronchiolitis, hypersensitivity pneumonitis or possible drug reaction. Atypical/viral pneumonia is also possible. Pulmonary consultation may be a helpful. A three-month follow-up noncontrast chest CT is suggested. Slight interval enlargement of mediastinal lymph nodes and a small lymph node along the left major fissure. This is most likely due to the inflammatory process in the lungs. No findings for metastatic disease involving the chest. No findings for upper abdominal metastatic disease.      02/29/2016 Adverse Reaction    His treatment is placed on hold due to possible hypersensitivity pneumonitis/drug reaction      04/14/2016 Imaging    Ct chest showed no evidence for metastatic disease within the chest. Significant interval improvement  and near complete resolution of previously described diffuse bilateral predominately ground-glass pulmonary opacities, most compatible with resolving infectious/inflammatory process.       INTERVAL HISTORY: Please see below for problem oriented charting. He returns for further treatment He has gained some weight with prednisone No cough or shortness of breath His neck pain is stable on medication  REVIEW OF SYSTEMS:   Constitutional: Denies fevers, chills or abnormal weight loss Eyes: Denies blurriness of vision Ears, nose, mouth, throat, and face: Denies mucositis or sore throat Respiratory: Denies cough, dyspnea or wheezes Cardiovascular: Denies palpitation, chest discomfort or lower extremity swelling Gastrointestinal:  Denies nausea, heartburn or change in bowel habits Skin: Denies abnormal skin rashes Lymphatics: Denies new lymphadenopathy or easy bruising Neurological:Denies numbness, tingling or new weaknesses Behavioral/Psych: Mood is stable, no new changes  All other systems were reviewed with the patient and are negative.  I have reviewed the past medical history, past surgical history, social history and family history with the patient and they are unchanged from previous note.  ALLERGIES:  is allergic to pollen extract.  MEDICATIONS:  Current Outpatient Prescriptions  Medication Sig Dispense Refill  . fluticasone (FLONASE) 50 MCG/ACT nasal spray Place 2 sprays into both nostrils daily. 16 g 2  . lidocaine-prilocaine (EMLA) cream Apply 1 application topically as needed. 30 g 6  . Multiple Vitamin (MULTIVITAMIN WITH MINERALS) TABS tablet Take 1 tablet by mouth daily.    Marland Kitchen omeprazole (PRILOSEC) 20 MG capsule Take 1 capsule (20 mg total) by mouth daily. 30 capsule 0  . ondansetron (ZOFRAN ODT) 4 MG disintegrating tablet Take 1 tablet (4 mg total) by mouth every 8 (eight) hours as needed for nausea or vomiting. 10 tablet 0  . ondansetron (ZOFRAN) 8 MG tablet Take 1 tablet  (8 mg total) by mouth every 8 (eight) hours as needed for nausea or vomiting. 90 tablet 1  . oxyCODONE (ROXICODONE) 15 MG immediate release tablet Take 1 tablet (15 mg total) by mouth every 4 (four) hours as needed. 90 tablet 0  . predniSONE (DELTASONE) 10 MG tablet Take 2 tablets (20 mg total) by mouth daily with breakfast. 40 tablet 0  . prochlorperazine (COMPAZINE) 10 MG tablet Take 1 tablet (10 mg total) by mouth every 6 (six) hours as needed (Nausea or vomiting). 60 tablet 1  . traZODone (DESYREL) 100 MG tablet Take 1 tablet (100 mg total) by mouth at bedtime. 30 tablet 6   No current facility-administered medications for this visit.     PHYSICAL EXAMINATION: ECOG PERFORMANCE STATUS: 1 - Symptomatic but completely ambulatory  Vitals:   04/22/16 1145  BP: 140/84  Pulse: 90  Resp: 19  Temp: 98.4 F (36.9 C)   Filed Weights   04/22/16 1145  Weight: 270 lb (122.5 kg)    GENERAL:alert,  no distress and comfortable SKIN: skin color, texture, turgor are normal, no rashes or significant lesions EYES: normal, Conjunctiva are pink and non-injected, sclera clear OROPHARYNX:no exudate, no erythema and lips, buccal mucosa, and tongue normal  NECK: well healed surgical scar LYMPH:  no palpable lymphadenopathy in the cervical, axillary or inguinal LUNGS: clear to auscultation and percussion with normal breathing effort HEART: regular rate & rhythm and no murmurs and no lower extremity edema ABDOMEN:abdomen soft, non-tender and normal bowel sounds Musculoskeletal:no cyanosis of digits and no clubbing  NEURO: alert & oriented x 3 with fluent speech, no focal motor/sensory deficits  LABORATORY DATA:  I have reviewed the data as listed    Component Value Date/Time   NA 142 04/21/2016 1111   K 3.8 04/21/2016 1111   CL 107 07/23/2015 1513   CO2 24 04/21/2016 1111   GLUCOSE 95 04/21/2016 1111   BUN 18.0 04/21/2016 1111   CREATININE 1.7 (H) 04/21/2016 1111   CALCIUM 9.5 04/21/2016 1111    PROT 7.0 04/21/2016 1111   ALBUMIN 4.0 04/21/2016 1111   AST 15 04/21/2016 1111   ALT 12 04/21/2016 1111   ALKPHOS 65 04/21/2016 1111   BILITOT 0.29 04/21/2016 1111   GFRNONAA 44 (L) 07/23/2015 1513   GFRAA 51 (L) 07/23/2015 1513    No results found for: SPEP, UPEP  Lab Results  Component Value Date   WBC 9.8 04/21/2016   NEUTROABS 7.9 (H) 04/21/2016   HGB 12.7 (L) 04/21/2016   HCT 39.3 04/21/2016   MCV 89.1 04/21/2016   PLT 230 04/21/2016      Chemistry      Component Value Date/Time   NA 142 04/21/2016 1111   K 3.8 04/21/2016 1111   CL 107 07/23/2015 1513   CO2 24 04/21/2016 1111   BUN 18.0 04/21/2016 1111   CREATININE 1.7 (H) 04/21/2016 1111      Component Value Date/Time   CALCIUM 9.5 04/21/2016 1111   ALKPHOS 65 04/21/2016 1111   AST 15 04/21/2016 1111   ALT 12 04/21/2016 1111   BILITOT 0.29 04/21/2016 1111       RADIOGRAPHIC STUDIES: I have personally reviewed the radiological images as listed and agreed with the findings in the report. Ct Chest Wo Contrast  Result Date: 04/15/2016 CLINICAL DATA:  Patient with history of pneumonitis diagnosed 2 months prior. On chemotherapy for tonsillar carcinoma. EXAM: CT CHEST WITHOUT CONTRAST TECHNIQUE: Multidetector CT imaging of the chest was performed following the standard protocol without IV contrast. COMPARISON:  Chest CT 02/26/2016. FINDINGS: Cardiovascular: Right anterior chest wall Port-A-Cath is present with tip terminating in the superior vena cava. Heart is normal in size. No pericardial effusion. Aorta and main pulmonary artery normal in caliber. Mediastinum/Nodes: No enlarged axillary, mediastinal or hilar lymphadenopathy. Esophagus is unremarkable. Lungs/Pleura: Central airways are patent. Biapical pleuroparenchymal thickening/ scarring. Unchanged probable scarring within the subpleural right lower lobe. No pleural effusion or pneumothorax. Significant interval improvement and near complete resolution of  previously described diffuse bilateral predominately ground-glass pulmonary opacities. Upper Abdomen: Unremarkable Musculoskeletal: Thoracic spine degenerative changes. No aggressive or acute appearing osseous lesions. IMPRESSION: No evidence for metastatic disease within the chest. Significant interval improvement and near complete resolution of previously described diffuse bilateral predominately ground-glass pulmonary opacities, most compatible with resolving infectious/ inflammatory process. Electronically Signed   By: Lovey Newcomer M.D.   On: 04/15/2016 08:43    ASSESSMENT & PLAN:  Tonsillar cancer His treatment was placed on hold due to pneumonitis. Recent Ct is  reviewed which showed complete resolution I recommend resumption of treatment and further prednisone taper I plan to space out his treatment to every 4 weeks Plan for next CT imaging in April 2018  Cancer associated pain The patient has cancer associated pain. His pain is intermittent in nature and I reassured the patient it is not due to cancer progression I recommend we continue the dose of oxycodone to 15 mg every 4 hours as needed for pain  Chronic kidney disease (CKD), stage II (mild) His kidney function is stable. As long as his creatinine clearance remain greater than 30, we will continue chemotherapy Monitor only   No orders of the defined types were placed in this encounter.  All questions were answered. The patient knows to call the clinic with any problems, questions or concerns. No barriers to learning was detected. I spent 15 minutes counseling the patient face to face. The total time spent in the appointment was 20 minutes and more than 50% was on counseling and review of test results     [name], MD 04/22/2016 9:58 PM

## 2016-04-22 NOTE — Assessment & Plan Note (Signed)
The patient has cancer associated pain. His pain is intermittent in nature and I reassured the patient it is not due to cancer progression I recommend we continue the dose of oxycodone to 15 mg every 4 hours as needed for pain 

## 2016-04-22 NOTE — Patient Instructions (Signed)
[facility] Discharge Instructions for Patients Receiving Chemotherapy  Today you received the following chemotherapy agents:  Keytruda.  To help prevent nausea and vomiting after your treatment, we encourage you to take your nausea medication as prescribed.   If you develop nausea and vomiting that is not controlled by your nausea medication, call the clinic.   BELOW ARE SYMPTOMS THAT SHOULD BE REPORTED IMMEDIATELY:  *FEVER GREATER THAN 100.5 F  *CHILLS WITH OR WITHOUT FEVER  NAUSEA AND VOMITING THAT IS NOT CONTROLLED WITH YOUR NAUSEA MEDICATION  *UNUSUAL SHORTNESS OF BREATH  *UNUSUAL BRUISING OR BLEEDING  TENDERNESS IN MOUTH AND THROAT WITH OR WITHOUT PRESENCE OF ULCERS  *URINARY PROBLEMS  *BOWEL PROBLEMS  UNUSUAL RASH Items with * indicate a potential emergency and should be followed up as soon as possible.  Feel free to call the clinic you have any questions or concerns. The clinic phone number is [phone].  Please show the CHEMO ALERT CARD at check-in to the Emergency Department and triage nurse.   

## 2016-04-22 NOTE — Assessment & Plan Note (Signed)
His kidney function is stable. As long as his creatinine clearance remain greater than 30, we will continue chemotherapy Monitor only 

## 2016-04-22 NOTE — Assessment & Plan Note (Signed)
His treatment was placed on hold due to pneumonitis. Recent Ct is reviewed which showed complete resolution I recommend resumption of treatment and further prednisone taper I plan to space out his treatment to every 4 weeks Plan for next CT imaging in April 2018

## 2016-04-26 ENCOUNTER — Telehealth: Payer: Self-pay | Admitting: Hematology and Oncology

## 2016-04-26 NOTE — Telephone Encounter (Signed)
I spoke with the patient and confirmed all appointments on 2/2

## 2016-05-20 ENCOUNTER — Telehealth: Payer: Self-pay | Admitting: Hematology and Oncology

## 2016-05-20 ENCOUNTER — Telehealth: Payer: Self-pay

## 2016-05-20 ENCOUNTER — Other Ambulatory Visit (HOSPITAL_BASED_OUTPATIENT_CLINIC_OR_DEPARTMENT_OTHER): Payer: Medicaid Other

## 2016-05-20 ENCOUNTER — Ambulatory Visit: Payer: Medicaid Other

## 2016-05-20 ENCOUNTER — Encounter: Payer: Self-pay | Admitting: Hematology and Oncology

## 2016-05-20 ENCOUNTER — Ambulatory Visit (HOSPITAL_BASED_OUTPATIENT_CLINIC_OR_DEPARTMENT_OTHER): Payer: Medicaid Other | Admitting: Hematology and Oncology

## 2016-05-20 ENCOUNTER — Ambulatory Visit (HOSPITAL_BASED_OUTPATIENT_CLINIC_OR_DEPARTMENT_OTHER): Payer: Medicaid Other

## 2016-05-20 ENCOUNTER — Telehealth: Payer: Self-pay | Admitting: *Deleted

## 2016-05-20 DIAGNOSIS — C099 Malignant neoplasm of tonsil, unspecified: Secondary | ICD-10-CM

## 2016-05-20 DIAGNOSIS — Z5112 Encounter for antineoplastic immunotherapy: Secondary | ICD-10-CM | POA: Diagnosis present

## 2016-05-20 DIAGNOSIS — G893 Neoplasm related pain (acute) (chronic): Secondary | ICD-10-CM

## 2016-05-20 DIAGNOSIS — N182 Chronic kidney disease, stage 2 (mild): Secondary | ICD-10-CM | POA: Diagnosis not present

## 2016-05-20 DIAGNOSIS — Z79899 Other long term (current) drug therapy: Secondary | ICD-10-CM

## 2016-05-20 DIAGNOSIS — K089 Disorder of teeth and supporting structures, unspecified: Secondary | ICD-10-CM | POA: Diagnosis not present

## 2016-05-20 LAB — COMPREHENSIVE METABOLIC PANEL
ALBUMIN: 4 g/dL (ref 3.5–5.0)
ALK PHOS: 56 U/L (ref 40–150)
ALT: 15 U/L (ref 0–55)
ANION GAP: 7 meq/L (ref 3–11)
AST: 20 U/L (ref 5–34)
BUN: 12.5 mg/dL (ref 7.0–26.0)
CALCIUM: 9.5 mg/dL (ref 8.4–10.4)
CHLORIDE: 105 meq/L (ref 98–109)
CO2: 28 mEq/L (ref 22–29)
Creatinine: 1.7 mg/dL — ABNORMAL HIGH (ref 0.7–1.3)
EGFR: 54 mL/min/{1.73_m2} — AB (ref >90)
Glucose: 94 mg/dl (ref 70–140)
POTASSIUM: 4.3 meq/L (ref 3.5–5.1)
Sodium: 140 mEq/L (ref 136–145)
Total Bilirubin: 0.46 mg/dL (ref 0.20–1.20)
Total Protein: 7 g/dL (ref 6.4–8.3)

## 2016-05-20 LAB — CBC WITH DIFFERENTIAL/PLATELET
BASO%: 0.1 % (ref 0.0–2.0)
BASOS ABS: 0 10*3/uL (ref 0.0–0.1)
EOS ABS: 0.2 10*3/uL (ref 0.0–0.5)
EOS%: 3.1 % (ref 0.0–7.0)
HEMATOCRIT: 38.9 % (ref 38.4–49.9)
HEMOGLOBIN: 13.1 g/dL (ref 13.0–17.1)
LYMPH#: 0.9 10*3/uL (ref 0.9–3.3)
LYMPH%: 12.9 % — ABNORMAL LOW (ref 14.0–49.0)
MCH: 29 pg (ref 27.2–33.4)
MCHC: 33.7 g/dL (ref 32.0–36.0)
MCV: 86.1 fL (ref 79.3–98.0)
MONO#: 0.4 10*3/uL (ref 0.1–0.9)
MONO%: 5.8 % (ref 0.0–14.0)
NEUT#: 5.4 10*3/uL (ref 1.5–6.5)
NEUT%: 78.1 % — AB (ref 39.0–75.0)
Platelets: 197 10*3/uL (ref 140–400)
RBC: 4.52 10*6/uL (ref 4.20–5.82)
RDW: 14.7 % — AB (ref 11.0–14.6)
WBC: 6.9 10*3/uL (ref 4.0–10.3)

## 2016-05-20 LAB — TSH: TSH: 2.965 m(IU)/L (ref 0.320–4.118)

## 2016-05-20 MED ORDER — OXYCODONE HCL 15 MG PO TABS: 15.0000 mg | ORAL_TABLET | ORAL | 0 refills | Status: DC | PRN

## 2016-05-20 MED ORDER — SODIUM CHLORIDE 0.9% FLUSH
10.0000 mL | INTRAVENOUS | Status: DC | PRN
  Administered 2016-05-20: 10 mL
  Filled 2016-05-20: qty 10

## 2016-05-20 MED ORDER — LIDOCAINE-PRILOCAINE 2.5-2.5 % EX CREA: 1.0000 "application " | TOPICAL_CREAM | CUTANEOUS | 6 refills | Status: DC | PRN

## 2016-05-20 MED ORDER — SODIUM CHLORIDE 0.9 % IV SOLN
Freq: Once | INTRAVENOUS | Status: AC
  Administered 2016-05-20: 13:00:00 via INTRAVENOUS

## 2016-05-20 MED ORDER — SODIUM CHLORIDE 0.9 % IV SOLN
200.0000 mg | Freq: Once | INTRAVENOUS | Status: AC
  Administered 2016-05-20: 200 mg via INTRAVENOUS
  Filled 2016-05-20: qty 8

## 2016-05-20 MED ORDER — HEPARIN SOD (PORK) LOCK FLUSH 100 UNIT/ML IV SOLN
500.0000 [IU] | Freq: Once | INTRAVENOUS | Status: AC | PRN
  Administered 2016-05-20: 500 [IU]
  Filled 2016-05-20: qty 5

## 2016-05-20 MED FILL — LIDOCAINE-PRILOCAINE CREAM: 2.5-2.5 | 30 days supply | Qty: 30 | Fill #0

## 2016-05-20 MED FILL — oxyCODONE HCL 15 MG TABS: 15 | 15 days supply | Qty: 90 | Fill #0

## 2016-05-20 NOTE — Telephone Encounter (Signed)
Message sent to infusion scheduler to be added. Appointments scheduled per 05/20/16 los. Patient was given a copy of the appointment schedule, per 05/20/16.

## 2016-05-20 NOTE — Assessment & Plan Note (Signed)
He tolerated recent chemotherapy well without any side effects. I will resume treatment today. I will continue to see him on a monthly basis. His next set of imaging study would be due in April 2018 

## 2016-05-20 NOTE — Telephone Encounter (Signed)
Per 2/2 LOS and staff message I have scheduled appts and notified the scheduler

## 2016-05-20 NOTE — Assessment & Plan Note (Signed)
His kidney function is stable. As long as his creatinine clearance remain greater than 30, we will continue chemotherapy Monitor only 

## 2016-05-20 NOTE — Assessment & Plan Note (Signed)
The patient has cancer associated pain. His pain is intermittent in nature and I reassured the patient it is not due to cancer progression I recommend we continue the dose of oxycodone to 15 mg every 4 hours as needed for pain 

## 2016-05-20 NOTE — Assessment & Plan Note (Signed)
He complained of dental pain. On examination of his lower jaw, it appears that his gumline has receded. I recommend dental evaluation

## 2016-05-20 NOTE — Progress Notes (Signed)
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 FNA of right level III lymph node was inconclusive for cancer      07/25/2013 Imaging    PET scan showed locally advanced disease with abnormal lymphadenopathy in the right axilla      08/06/2013 Procedure    CT-guided biopsy of the lymphadenopathy was negative for malignancy      08/15/2013 Surgery    Patient has placement of port and feeding tube      08/19/2013 - 09/10/2013 Chemotherapy    Patient received chemotherapy with cisplatin. The patient only received 2 doses due to uncontrolled nausea and acute renal failure.      08/19/2013 - 10/15/2013 Radiation Therapy    Received Helical IMRT Tomotherapy:  Right Tonstil and bilateral neck / 70 Gy in 35 fractions to gross disease, 63 Gy in 35 fractions to high risk nodal echelons, and 56 Gy in 35 fractions to intermediate risk nodal echelons.      08/27/2013 - 08/30/2013 Hospital Admission    The patient was admitted to the hospital for uncontrolled nausea vomiting and dehydration.      02/14/2014 Imaging    PET/CT scan showed complete response to treatment      03/19/2014 Surgery    He had excisional lymph node biopsy from the right neck. Pathology was benign      05/13/2014 Surgery    He had  removal of Port-A-Cath.      05/22/2014 Imaging    Repeat CT scan of the neck show no evidence of disease recurrence.      12/09/2014 Imaging    Ct neck without contrast showed persistent abnormalities on the right side of neck, indeterminate      12/25/2014 Imaging    PET CT scan showed disease recurrence.      02/10/2015 Procedure    He has placement of port      02/13/2015 - 07/13/2015 Chemotherapy    He received chemotherapy with carbo/Taxol      04/21/2015 Imaging    PET CT scan showed improved disease control      08/04/2015 Imaging    PET scan showed persistent disease      08/17/2015 -  Chemotherapy    He was started with [facility]      10/19/2015 Imaging    Ct neck showed mass-like intermediate density soft tissue at the right lateral neck recurrence site stable.       02/26/2016 Imaging    CT neck showed unchanged appearance of right neck recurrence compared to 10/19/2015 CT. No noncontrast evidence of new metastatic disease in the neck. Chronic sinusitis, progressed.      02/26/2016 Imaging    Diffuse but patchy and asymmetric partial airspace filling process (ground-glass opacity) in the lungs. This could be due  to respiratory bronchiolitis, hypersensitivity pneumonitis or possible drug reaction. Atypical/viral pneumonia is also possible. Pulmonary consultation may be a helpful. A three-month follow-up noncontrast chest CT is suggested. Slight interval enlargement of mediastinal lymph nodes and a small lymph node along the left major fissure. This is most likely due to the inflammatory process in the lungs. No findings for metastatic disease involving the chest. No findings for upper abdominal metastatic disease.      02/29/2016 Adverse Reaction    His treatment is placed on hold due to possible hypersensitivity pneumonitis/drug reaction      04/14/2016 Imaging    Ct chest showed no evidence for metastatic disease within the chest. Significant interval improvement  and near complete resolution of previously described diffuse bilateral predominately ground-glass pulmonary opacities, most compatible with resolving infectious/inflammatory process.       INTERVAL HISTORY: Please see below for problem oriented charting. He returns for further follow-up. He is delayed today because he forgot about his appointment. He continues to have intermittent neck pain. He complained of significant jaw pain near the radiation site and requested evaluation. He denies recent cough, chest pain or shortness of breath. He is not smoking. He is wondering about his disability benefit and wants to return back to work. His appetite is great and he started to gain a lot of weight recently.  REVIEW OF SYSTEMS:   Constitutional: Denies fevers, chills or abnormal weight loss Eyes: Denies blurriness of vision Ears, nose, mouth, throat, and face: Denies mucositis or sore throat Respiratory: Denies cough, dyspnea or wheezes Cardiovascular: Denies palpitation, chest discomfort or lower extremity swelling Gastrointestinal:  Denies nausea, heartburn or change in bowel habits Skin: Denies abnormal skin rashes Lymphatics: Denies new lymphadenopathy or easy bruising Neurological:Denies numbness, tingling or new weaknesses Behavioral/Psych: Mood is stable, no new changes  All other systems were reviewed with the patient and are negative.  I have reviewed the past medical history, past surgical history, social history and family history with the patient and they are unchanged from previous note.  ALLERGIES:  is allergic to pollen extract.  MEDICATIONS:  Current Outpatient Prescriptions  Medication Sig Dispense Refill  . fluticasone (FLONASE) 50 MCG/ACT nasal spray Place 2 sprays into both nostrils daily. 16 g 2  . lidocaine-prilocaine (EMLA) cream Apply 1 application topically as needed. 30 g 6  . Multiple Vitamin (MULTIVITAMIN WITH MINERALS) TABS tablet Take 1 tablet by mouth  daily.    Marland Kitchen omeprazole (PRILOSEC) 20 MG capsule Take 1 capsule (20 mg total) by mouth daily. 30 capsule 0  . ondansetron (ZOFRAN ODT) 4 MG disintegrating tablet Take 1 tablet (4 mg total) by mouth every 8 (eight) hours as needed for nausea or vomiting. 10 tablet 0  . ondansetron (ZOFRAN) 8 MG tablet Take 1 tablet (8 mg total) by mouth every 8 (eight) hours as needed for nausea or vomiting. 90 tablet 1  . oxyCODONE (ROXICODONE) 15 MG immediate release tablet Take 1 tablet (15 mg total) by mouth every 4 (four) hours as needed. 90 tablet 0  . prochlorperazine (COMPAZINE) 10 MG tablet Take 1 tablet (10 mg total) by mouth every 6 (six) hours as needed (Nausea or vomiting). 60 tablet 1  . traZODone (DESYREL) 100 MG tablet Take 1 tablet (100 mg total) by mouth at bedtime. 30 tablet 6   No current facility-administered medications for this visit.    Facility-Administered Medications Ordered in Other Visits  Medication Dose Route Frequency Provider Last Rate Last Dose  . sodium chloride  flush (NS) 0.9 % injection 10 mL  10 mL Intracatheter PRN [name], MD   10 mL at 05/20/16 1456    PHYSICAL EXAMINATION: ECOG PERFORMANCE STATUS: 1 - Symptomatic but completely ambulatory  Vitals:   05/20/16 1238  BP: 137/74  Pulse: 87  Resp: 18  Temp: 98.8 F (37.1 C)   Filed Weights   05/20/16 1238  Weight: 272 lb (123.4 kg)    GENERAL:alert, no distress and comfortable SKIN: skin color, texture, turgor are normal, no rashes or significant lesions EYES: normal, Conjunctiva are pink and non-injected, sclera clear OROPHARYNX:no exudate, no erythema and lips, buccal mucosa, and tongue normal.Noted poor dentition. The gumline in the lower part of the jaw has receded NECK: His neck appears thickened woody from prior radiation LYMPH:  no palpable lymphadenopathy in the cervical, axillary or inguinal LUNGS: clear to auscultation and percussion with normal breathing effort HEART: regular rate & rhythm and no  murmurs and no lower extremity edema ABDOMEN:abdomen soft, non-tender and normal bowel sounds Musculoskeletal:no cyanosis of digits and no clubbing  NEURO: alert & oriented x 3 with fluent speech, no focal motor/sensory deficits  LABORATORY DATA:  I have reviewed the data as listed    Component Value Date/Time   NA 140 05/20/2016 1152   K 4.3 05/20/2016 1152   CL 107 07/23/2015 1513   CO2 28 05/20/2016 1152   GLUCOSE 94 05/20/2016 1152   BUN 12.5 05/20/2016 1152   CREATININE 1.7 (H) 05/20/2016 1152   CALCIUM 9.5 05/20/2016 1152   PROT 7.0 05/20/2016 1152   ALBUMIN 4.0 05/20/2016 1152   AST 20 05/20/2016 1152   ALT 15 05/20/2016 1152   ALKPHOS 56 05/20/2016 1152   BILITOT 0.46 05/20/2016 1152   GFRNONAA 44 (L) 07/23/2015 1513   GFRAA 51 (L) 07/23/2015 1513    No results found for: SPEP, UPEP  Lab Results  Component Value Date   WBC 6.9 05/20/2016   NEUTROABS 5.4 05/20/2016   HGB 13.1 05/20/2016   HCT 38.9 05/20/2016   MCV 86.1 05/20/2016   PLT 197 05/20/2016      Chemistry      Component Value Date/Time   NA 140 05/20/2016 1152   K 4.3 05/20/2016 1152   CL 107 07/23/2015 1513   CO2 28 05/20/2016 1152   BUN 12.5 05/20/2016 1152   CREATININE 1.7 (H) 05/20/2016 1152      Component Value Date/Time   CALCIUM 9.5 05/20/2016 1152   ALKPHOS 56 05/20/2016 1152   AST 20 05/20/2016 1152   ALT 15 05/20/2016 1152   BILITOT 0.46 05/20/2016 1152      ASSESSMENT & PLAN:  Tonsillar cancer He tolerated recent chemotherapy well without any side effects. I will resume treatment today. I will continue to see him on a monthly basis. His next set of imaging study would be due in April 2018  Chronic kidney disease (CKD), stage II (mild) His kidney function is stable. As long as his creatinine clearance remain greater than 30, we will continue chemotherapy Monitor only  Cancer associated pain The patient has cancer associated pain. His pain is intermittent in nature and  I reassured the patient it is not due to cancer progression I recommend we continue the dose of oxycodone to 15 mg every 4 hours as needed for pain  Poor dentition He complained of dental pain. On examination of his lower jaw, it appears that his gumline has receded. I recommend dental evaluation   No orders of  the defined types were placed in this encounter.  All questions were answered. The patient knows to call the clinic with any problems, questions or concerns. No barriers to learning was detected. I spent 20 minutes counseling the patient face to face. The total time spent in the appointment was 25 minutes and more than 50% was on counseling and review of test results     [name], MD 05/20/2016 6:31 PM

## 2016-05-20 NOTE — Patient Instructions (Signed)
[facility] Discharge Instructions for Patients Receiving Chemotherapy  Today you received the following chemotherapy agents: Keytruda   To help prevent nausea and vomiting after your treatment, we encourage you to take your nausea medication as directed    If you develop nausea and vomiting that is not controlled by your nausea medication, call the clinic.   BELOW ARE SYMPTOMS THAT SHOULD BE REPORTED IMMEDIATELY:  *FEVER GREATER THAN 100.5 F  *CHILLS WITH OR WITHOUT FEVER  NAUSEA AND VOMITING THAT IS NOT CONTROLLED WITH YOUR NAUSEA MEDICATION  *UNUSUAL SHORTNESS OF BREATH  *UNUSUAL BRUISING OR BLEEDING  TENDERNESS IN MOUTH AND THROAT WITH OR WITHOUT PRESENCE OF ULCERS  *URINARY PROBLEMS  *BOWEL PROBLEMS  UNUSUAL RASH Items with * indicate a potential emergency and should be followed up as soon as possible.  Feel free to call the clinic you have any questions or concerns. The clinic phone number is [phone].  Please show the CHEMO ALERT CARD at check-in to the Emergency Department and triage nurse.   

## 2016-05-20 NOTE — Telephone Encounter (Signed)
Called patient regarding being late for appointments today. Stated he will be here in 20 minutes.

## 2016-05-23 ENCOUNTER — Telehealth (HOSPITAL_COMMUNITY): Payer: Self-pay | Admitting: Dentistry

## 2016-05-23 NOTE — Telephone Encounter (Signed)
05/23/16  Called and left msg. on home/mobile # to call Dental Medicine to schedule appt. w/ Dr. Enrique Sack. LRI

## 2016-05-24 ENCOUNTER — Ambulatory Visit (HOSPITAL_COMMUNITY): Payer: Self-pay | Admitting: Dentistry

## 2016-05-24 ENCOUNTER — Encounter: Payer: Self-pay | Admitting: *Deleted

## 2016-05-24 ENCOUNTER — Encounter (HOSPITAL_COMMUNITY): Payer: Self-pay

## 2016-05-24 ENCOUNTER — Telehealth (HOSPITAL_COMMUNITY): Payer: Self-pay | Admitting: Dentistry

## 2016-05-24 NOTE — Telephone Encounter (Signed)
05/24/16  No show/broken appt. for F/U re-eval  w/ Dr. Enrique Sack @ Dental Medicine on 05/24/16.  Called pt. / pt. stated he is sick and will call back to reschedule at a later date.  LRI

## 2016-05-25 NOTE — Progress Notes (Signed)
[place] Work  Clinical Social Work was referred by Psychologist, forensic.  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  The patient reported he feels "well enough to go back to work" and expressed the need for additional income for himself and his family.  CSW encouraged patient to explore going back to work options and discuss with his medical oncologist if he has clearance to work.  Patient plans to follow up with CSW if he has additional questions.  Polo Riley, MSW, LCSW, OSW-C Clinical Social Worker [facility] [phone]

## 2016-06-17 ENCOUNTER — Ambulatory Visit: Payer: Medicaid Other

## 2016-06-17 ENCOUNTER — Other Ambulatory Visit (HOSPITAL_COMMUNITY)
Admission: AD | Admit: 2016-06-17 | Discharge: 2016-06-17 | Disposition: A | Discharge status: Home / Self Care | Payer: Medicaid Other | Source: Ambulatory Visit | Attending: Hematology and Oncology | Admitting: Hematology and Oncology

## 2016-06-17 ENCOUNTER — Telehealth: Payer: Self-pay | Admitting: Hematology and Oncology

## 2016-06-17 ENCOUNTER — Other Ambulatory Visit (HOSPITAL_BASED_OUTPATIENT_CLINIC_OR_DEPARTMENT_OTHER): Payer: Medicaid Other

## 2016-06-17 ENCOUNTER — Ambulatory Visit (HOSPITAL_BASED_OUTPATIENT_CLINIC_OR_DEPARTMENT_OTHER): Payer: Medicaid Other | Admitting: Hematology and Oncology

## 2016-06-17 ENCOUNTER — Encounter: Payer: Self-pay | Admitting: Hematology and Oncology

## 2016-06-17 ENCOUNTER — Ambulatory Visit (HOSPITAL_BASED_OUTPATIENT_CLINIC_OR_DEPARTMENT_OTHER): Payer: Medicaid Other

## 2016-06-17 DIAGNOSIS — G893 Neoplasm related pain (acute) (chronic): Secondary | ICD-10-CM

## 2016-06-17 DIAGNOSIS — R05 Cough: Secondary | ICD-10-CM

## 2016-06-17 DIAGNOSIS — N182 Chronic kidney disease, stage 2 (mild): Secondary | ICD-10-CM | POA: Diagnosis not present

## 2016-06-17 DIAGNOSIS — G4709 Other insomnia: Secondary | ICD-10-CM

## 2016-06-17 DIAGNOSIS — C099 Malignant neoplasm of tonsil, unspecified: Secondary | ICD-10-CM | POA: Diagnosis present

## 2016-06-17 DIAGNOSIS — Z5112 Encounter for antineoplastic immunotherapy: Secondary | ICD-10-CM | POA: Diagnosis present

## 2016-06-17 DIAGNOSIS — R059 Cough, unspecified: Secondary | ICD-10-CM

## 2016-06-17 DIAGNOSIS — Z79899 Other long term (current) drug therapy: Secondary | ICD-10-CM

## 2016-06-17 DIAGNOSIS — R0981 Nasal congestion: Secondary | ICD-10-CM

## 2016-06-17 DIAGNOSIS — R030 Elevated blood-pressure reading, without diagnosis of hypertension: Secondary | ICD-10-CM

## 2016-06-17 DIAGNOSIS — C770 Secondary and unspecified malignant neoplasm of lymph nodes of head, face and neck: Secondary | ICD-10-CM | POA: Insufficient documentation

## 2016-06-17 LAB — TSH: TSH: 3.829 m[IU]/L (ref 0.320–4.118)

## 2016-06-17 LAB — INFLUENZA PANEL BY PCR (TYPE A & B)
INFLAPCR: NEGATIVE
INFLBPCR: NEGATIVE

## 2016-06-17 LAB — CBC WITH DIFFERENTIAL/PLATELET
BASO%: 0.4 % (ref 0.0–2.0)
Basophils Absolute: 0 10*3/uL (ref 0.0–0.1)
EOS%: 1.6 % (ref 0.0–7.0)
Eosinophils Absolute: 0.1 10*3/uL (ref 0.0–0.5)
HEMATOCRIT: 40.5 % (ref 38.4–49.9)
HEMOGLOBIN: 13.2 g/dL (ref 13.0–17.1)
LYMPH#: 0.7 10*3/uL — AB (ref 0.9–3.3)
LYMPH%: 10.9 % — ABNORMAL LOW (ref 14.0–49.0)
MCH: 29 pg (ref 27.2–33.4)
MCHC: 32.6 g/dL (ref 32.0–36.0)
MCV: 89.1 fL (ref 79.3–98.0)
MONO#: 0.4 10*3/uL (ref 0.1–0.9)
MONO%: 5.9 % (ref 0.0–14.0)
NEUT%: 81.2 % — AB (ref 39.0–75.0)
NEUTROS ABS: 5.3 10*3/uL (ref 1.5–6.5)
PLATELETS: 192 10*3/uL (ref 140–400)
RBC: 4.54 10*6/uL (ref 4.20–5.82)
RDW: 15.4 % — ABNORMAL HIGH (ref 11.0–14.6)
WBC: 6.5 10*3/uL (ref 4.0–10.3)

## 2016-06-17 LAB — COMPREHENSIVE METABOLIC PANEL
ALBUMIN: 4.1 g/dL (ref 3.5–5.0)
ALK PHOS: 60 U/L (ref 40–150)
ALT: 13 U/L (ref 0–55)
ANION GAP: 9 meq/L (ref 3–11)
AST: 18 U/L (ref 5–34)
BILIRUBIN TOTAL: 0.44 mg/dL (ref 0.20–1.20)
BUN: 14.7 mg/dL (ref 7.0–26.0)
CALCIUM: 9.9 mg/dL (ref 8.4–10.4)
CO2: 26 mEq/L (ref 22–29)
CREATININE: 1.7 mg/dL — AB (ref 0.7–1.3)
Chloride: 106 mEq/L (ref 98–109)
EGFR: 55 mL/min/{1.73_m2} — ABNORMAL LOW (ref >90)
Glucose: 104 mg/dl (ref 70–140)
Potassium: 4.1 mEq/L (ref 3.5–5.1)
Sodium: 141 mEq/L (ref 136–145)
TOTAL PROTEIN: 7.2 g/dL (ref 6.4–8.3)

## 2016-06-17 MED ORDER — HEPARIN SOD (PORK) LOCK FLUSH 100 UNIT/ML IV SOLN
500.0000 [IU] | Freq: Once | INTRAVENOUS | Status: AC | PRN
  Administered 2016-06-17: 500 [IU]
  Filled 2016-06-17: qty 5

## 2016-06-17 MED ORDER — SODIUM CHLORIDE 0.9 % IJ SOLN
10.0000 mL | INTRAMUSCULAR | Status: DC | PRN
  Administered 2016-06-17: 10 mL via INTRAVENOUS
  Filled 2016-06-17: qty 10

## 2016-06-17 MED ORDER — SODIUM CHLORIDE 0.9% FLUSH
10.0000 mL | INTRAVENOUS | Status: DC | PRN
  Administered 2016-06-17: 10 mL
  Filled 2016-06-17: qty 10

## 2016-06-17 MED ORDER — TRAZODONE HCL 150 MG PO TABS: 150.0000 mg | ORAL_TABLET | Freq: Every day | ORAL | 11 refills | Status: DC

## 2016-06-17 MED ORDER — SODIUM CHLORIDE 0.9 % IV SOLN
Freq: Once | INTRAVENOUS | Status: AC
  Administered 2016-06-17: 12:00:00 via INTRAVENOUS

## 2016-06-17 MED ORDER — OXYCODONE HCL 15 MG PO TABS: 15.0000 mg | ORAL_TABLET | ORAL | 0 refills | Status: DC | PRN

## 2016-06-17 MED ORDER — SODIUM CHLORIDE 0.9 % IV SOLN
200.0000 mg | Freq: Once | INTRAVENOUS | Status: AC
  Administered 2016-06-17: 200 mg via INTRAVENOUS
  Filled 2016-06-17: qty 8

## 2016-06-17 MED FILL — traZODone HCL 150 MG TABS: 150 | 30 days supply | Qty: 30 | Fill #0

## 2016-06-17 MED FILL — oxyCODONE HCL 15 MG TABS: 15 | 15 days supply | Qty: 90 | Fill #0

## 2016-06-17 NOTE — Assessment & Plan Note (Signed)
He has minor headache. His blood pressure is mildly elevated. He has poor sleep. We discussed conservative management with Tylenol as needed and I plan to modify his medication that he takes for insomnia.  We will monitor his blood pressure carefully.

## 2016-06-17 NOTE — Assessment & Plan Note (Signed)
The patient has cancer associated pain. His pain is intermittent in nature and I reassured the patient it is not due to cancer progression I recommend we continue the dose of oxycodone to 15 mg every 4 hours as needed for pain 

## 2016-06-17 NOTE — Assessment & Plan Note (Signed)
He tolerated recent chemotherapy well without any side effects. I will resume treatment today. I will continue to see him on a monthly basis. His next set of imaging study would be due in April 2018 

## 2016-06-17 NOTE — Progress Notes (Signed)
Called patient in the infusion room and let him know that flu swab is negative.

## 2016-06-17 NOTE — Telephone Encounter (Signed)
Appointments scheduled per 3/2 LOS. Spoke with patient in regards to his next scheduled appointments.

## 2016-06-17 NOTE — Assessment & Plan Note (Signed)
He had recent symptoms of mild sore throat but denies fever, nasal congestion and cold. I order a rapid PCR test for influenza and that came back negative. I reassured him this is likely related to minor cold-like symptoms. We discussed conservative management.  I do not feel he needs antibiotic therapy. We will proceed with treatment today without delay.

## 2016-06-17 NOTE — Assessment & Plan Note (Signed)
His kidney function is stable. As long as his creatinine clearance remain greater than 30, we will continue chemotherapy Monitor only 

## 2016-06-17 NOTE — Assessment & Plan Note (Signed)
He has been on intermittent doses of trazodone. He slept very poorly recently, due to sleep interruption from his work and poor sleep hygiene. I plan to try to increase the dose of trazodone to 150 mg.  He agreed to try.

## 2016-06-17 NOTE — Patient Instructions (Signed)
[facility] Discharge Instructions for Patients Receiving Chemotherapy  Today you received the following chemotherapy agents: Keytruda   To help prevent nausea and vomiting after your treatment, we encourage you to take your nausea medication as directed    If you develop nausea and vomiting that is not controlled by your nausea medication, call the clinic.   BELOW ARE SYMPTOMS THAT SHOULD BE REPORTED IMMEDIATELY:  *FEVER GREATER THAN 100.5 F  *CHILLS WITH OR WITHOUT FEVER  NAUSEA AND VOMITING THAT IS NOT CONTROLLED WITH YOUR NAUSEA MEDICATION  *UNUSUAL SHORTNESS OF BREATH  *UNUSUAL BRUISING OR BLEEDING  TENDERNESS IN MOUTH AND THROAT WITH OR WITHOUT PRESENCE OF ULCERS  *URINARY PROBLEMS  *BOWEL PROBLEMS  UNUSUAL RASH Items with * indicate a potential emergency and should be followed up as soon as possible.  Feel free to call the clinic you have any questions or concerns. The clinic phone number is [phone].  Please show the CHEMO ALERT CARD at check-in to the Emergency Department and triage nurse.   

## 2016-06-17 NOTE — Progress Notes (Signed)
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 FNA of right level III lymph node was inconclusive for cancer      07/25/2013 Imaging    PET scan showed locally advanced disease with abnormal lymphadenopathy in the right axilla      08/06/2013 Procedure    CT-guided biopsy of the lymphadenopathy was negative for malignancy      08/15/2013 Surgery    Patient has placement of port and feeding tube      08/19/2013 - 09/10/2013 Chemotherapy    Patient received chemotherapy with cisplatin. The patient only received 2 doses due to uncontrolled nausea and acute renal failure.      08/19/2013 - 10/15/2013 Radiation Therapy    Received Helical IMRT Tomotherapy:  Right Tonstil and bilateral neck / 70 Gy in 35 fractions to gross disease, 63 Gy in 35 fractions to high risk nodal echelons, and 56 Gy in 35 fractions to intermediate risk nodal echelons.      08/27/2013 - 08/30/2013 Hospital Admission    The patient was admitted to the hospital for uncontrolled nausea vomiting and dehydration.      02/14/2014 Imaging    PET/CT scan showed complete response to treatment      03/19/2014 Surgery    He had excisional lymph node biopsy from the right neck. Pathology was benign      05/13/2014 Surgery    He had  removal of Port-A-Cath.      05/22/2014 Imaging    Repeat CT scan of the neck show no evidence of disease recurrence.      12/09/2014 Imaging    Ct neck without contrast showed persistent abnormalities on the right side of neck, indeterminate      12/25/2014 Imaging    PET CT scan showed disease recurrence.      02/10/2015 Procedure    He has placement of port      02/13/2015 - 07/13/2015 Chemotherapy    He received chemotherapy with carbo/Taxol      04/21/2015 Imaging    PET CT scan showed improved disease control      08/04/2015 Imaging    PET scan showed persistent disease      08/17/2015 -  Chemotherapy    He was started with [facility]      10/19/2015 Imaging    Ct neck showed mass-like intermediate density soft tissue at the right lateral neck recurrence site stable.       02/26/2016 Imaging    CT neck showed unchanged appearance of right neck recurrence compared to 10/19/2015 CT. No noncontrast evidence of new metastatic disease in the neck. Chronic sinusitis, progressed.      02/26/2016 Imaging    Diffuse but patchy and asymmetric partial airspace filling process (ground-glass opacity) in the lungs. This could be due  to respiratory bronchiolitis, hypersensitivity pneumonitis or possible drug reaction. Atypical/viral pneumonia is also possible. Pulmonary consultation may be a helpful. A three-month follow-up noncontrast chest CT is suggested. Slight interval enlargement of mediastinal lymph nodes and a small lymph node along the left major fissure. This is most likely due to the inflammatory process in the lungs. No findings for metastatic disease involving the chest. No findings for upper abdominal metastatic disease.      02/29/2016 Adverse Reaction    His treatment is placed on hold due to possible hypersensitivity pneumonitis/drug reaction      04/14/2016 Imaging    Ct chest showed no evidence for metastatic disease within the chest. Significant interval improvement  and near complete resolution of previously described diffuse bilateral predominately ground-glass pulmonary opacities, most compatible with resolving infectious/inflammatory process.       INTERVAL HISTORY: Please see below for problem oriented charting. He is seen prior to chemotherapy today. Recently, he had recent sick contact causing him to have cold-like symptoms.  He complained of some sore throat, cough but denies fever.  He has some recent nausea and diarrhea, resolved conservatively. He has some mild intermittent headaches. He sleeps poorly. Denies new neck lymphadenopathy.  No recent abnormal anorexia or weight loss.  REVIEW OF SYSTEMS:   Eyes: Denies blurriness of vision Respiratory: Denies cough, dyspnea or wheezes Cardiovascular: Denies palpitation, chest discomfort or lower extremity swelling Skin: Denies abnormal skin rashes Lymphatics: Denies new lymphadenopathy or easy bruising Neurological:Denies numbness, tingling or new weaknesses Behavioral/Psych: Mood is stable, no new changes  All other systems were reviewed with the patient and are negative.  I have reviewed the past medical history, past surgical history, social history and family history with the patient and they are unchanged from previous note.  ALLERGIES:  is allergic to pollen extract.  MEDICATIONS:  Current Outpatient Prescriptions  Medication Sig Dispense Refill  . fluticasone (FLONASE) 50 MCG/ACT nasal spray Place 2 sprays into both nostrils daily. 16 g 2  . lidocaine-prilocaine (EMLA) cream Apply 1 application topically as needed. 30 g 6  . Multiple Vitamin (MULTIVITAMIN WITH MINERALS) TABS tablet Take 1 tablet by mouth daily.    Marland Kitchen omeprazole (PRILOSEC) 20 MG capsule Take 1 capsule (20 mg total) by mouth daily. 30 capsule 0  . ondansetron (ZOFRAN ODT) 4 MG disintegrating tablet Take 1 tablet (4 mg total) by mouth every 8 (eight) hours as needed for nausea or vomiting. 10 tablet 0  . ondansetron  (ZOFRAN) 8 MG tablet Take 1 tablet (8 mg total) by mouth every 8 (eight) hours as needed for nausea or vomiting. 90 tablet 1  . oxyCODONE (ROXICODONE) 15 MG immediate release tablet Take 1 tablet (15 mg total) by mouth every 4 (four) hours as needed. 90 tablet 0  . prochlorperazine (COMPAZINE) 10 MG tablet Take 1 tablet (10 mg total) by mouth every 6 (six) hours as needed (Nausea or vomiting). 60 tablet 1  . traZODone (DESYREL) 150 MG tablet Take 1 tablet (150 mg total) by mouth at bedtime. 30 tablet 11   No current facility-administered medications for this visit.    Facility-Administered Medications Ordered in Other Visits  Medication Dose Route Frequency Provider Last Rate Last Dose  . sodium chloride flush (NS) 0.9 % injection 10 mL  10 mL Intracatheter PRN [name], MD   10 mL at 06/17/16 1222    PHYSICAL EXAMINATION: ECOG PERFORMANCE STATUS: 1 - Symptomatic but completely ambulatory  Vitals:   06/17/16 1013  BP: Marland Kitchen)  152/89  Pulse: 75  Resp: 18  Temp: 97.6 F (36.4 C)   Filed Weights   06/17/16 1013  Weight: 272 lb 12.8 oz (123.7 kg)    GENERAL:alert, no distress and comfortable SKIN: skin color, texture, turgor are normal, no rashes or significant lesions EYES: normal, Conjunctiva are pink and non-injected, sclera clear OROPHARYNX:no exudate, no erythema and lips, buccal mucosa, and tongue normal  NECK: Well-healed surgical scar in his neck. LYMPH:  no palpable lymphadenopathy in the cervical, axillary or inguinal LUNGS: clear to auscultation and percussion with normal breathing effort HEART: regular rate & rhythm and no murmurs and no lower extremity edema ABDOMEN:abdomen soft, non-tender and normal bowel sounds Musculoskeletal:no cyanosis of digits and no clubbing  NEURO: alert & oriented x 3 with fluent speech, no focal motor/sensory deficits  LABORATORY DATA:  I have reviewed the data as listed    Component Value Date/Time   NA 141 06/17/2016 0945   K 4.1  06/17/2016 0945   CL 107 07/23/2015 1513   CO2 26 06/17/2016 0945   GLUCOSE 104 06/17/2016 0945   BUN 14.7 06/17/2016 0945   CREATININE 1.7 (H) 06/17/2016 0945   CALCIUM 9.9 06/17/2016 0945   PROT 7.2 06/17/2016 0945   ALBUMIN 4.1 06/17/2016 0945   AST 18 06/17/2016 0945   ALT 13 06/17/2016 0945   ALKPHOS 60 06/17/2016 0945   BILITOT 0.44 06/17/2016 0945   GFRNONAA 44 (L) 07/23/2015 1513   GFRAA 51 (L) 07/23/2015 1513    No results found for: SPEP, UPEP  Lab Results  Component Value Date   WBC 6.5 06/17/2016   NEUTROABS 5.3 06/17/2016   HGB 13.2 06/17/2016   HCT 40.5 06/17/2016   MCV 89.1 06/17/2016   PLT 192 06/17/2016      Chemistry      Component Value Date/Time   NA 141 06/17/2016 0945   K 4.1 06/17/2016 0945   CL 107 07/23/2015 1513   CO2 26 06/17/2016 0945   BUN 14.7 06/17/2016 0945   CREATININE 1.7 (H) 06/17/2016 0945      Component Value Date/Time   CALCIUM 9.9 06/17/2016 0945   ALKPHOS 60 06/17/2016 0945   AST 18 06/17/2016 0945   ALT 13 06/17/2016 0945   BILITOT 0.44 06/17/2016 0945      ASSESSMENT & PLAN:  Tonsillar cancer He tolerated recent chemotherapy well without any side effects. I will resume treatment today. I will continue to see him on a monthly basis. 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 His pain is intermittent in nature and I reassured the patient it is not due to cancer progression I recommend we continue the dose of oxycodone to 15 mg every 4 hours as needed for pain  Chronic kidney disease (CKD), stage II (mild) His kidney function is stable. As long as his creatinine clearance remain greater than 30, we will continue chemotherapy Monitor only  Nasal congestion He had recent symptoms of mild sore throat but denies fever, nasal congestion and cold. I order a rapid PCR test for influenza and that came back negative. I reassured him this is  likely related to minor cold-like symptoms. We discussed conservative management.  I do not feel he needs antibiotic therapy. We will proceed with treatment today without delay.  Elevated BP without diagnosis of hypertension He has minor headache. His blood pressure is mildly elevated. He has poor sleep. We discussed  conservative management with Tylenol as needed and I plan to modify his medication that he takes for insomnia.  We will monitor his blood pressure carefully.  Insomnia He has been on intermittent doses of trazodone. He slept very poorly recently, due to sleep interruption from his work and poor sleep hygiene. I plan to try to increase the dose of trazodone to 150 mg.  He agreed to try.   Orders Placed This Encounter  Procedures  . Respiratory virus panel    Standing Status:   Future    Standing Expiration Date:   06/17/2017  . Respiratory Panel by PCR    Standing Status:   Standing    Number of Occurrences:   1  . Droplet precaution    Standing Status:   Standing    Number of Occurrences:   1   All questions were answered. The patient knows to call the clinic with any problems, questions or concerns. No barriers to learning was detected. I spent 25 minutes counseling the patient face to face. The total time spent in the appointment was 40 minutes and more than 50% was on counseling and review of test results     [name], MD 06/17/2016 3:00 PM

## 2016-07-15 ENCOUNTER — Encounter: Payer: Self-pay | Admitting: Hematology and Oncology

## 2016-07-15 ENCOUNTER — Ambulatory Visit (HOSPITAL_BASED_OUTPATIENT_CLINIC_OR_DEPARTMENT_OTHER): Payer: Medicaid Other

## 2016-07-15 ENCOUNTER — Ambulatory Visit (HOSPITAL_BASED_OUTPATIENT_CLINIC_OR_DEPARTMENT_OTHER): Payer: Medicaid Other | Admitting: Hematology and Oncology

## 2016-07-15 ENCOUNTER — Other Ambulatory Visit (HOSPITAL_BASED_OUTPATIENT_CLINIC_OR_DEPARTMENT_OTHER): Payer: Medicaid Other

## 2016-07-15 ENCOUNTER — Other Ambulatory Visit: Payer: Self-pay | Admitting: *Deleted

## 2016-07-15 ENCOUNTER — Ambulatory Visit: Payer: Medicaid Other

## 2016-07-15 DIAGNOSIS — G893 Neoplasm related pain (acute) (chronic): Secondary | ICD-10-CM | POA: Diagnosis not present

## 2016-07-15 DIAGNOSIS — C099 Malignant neoplasm of tonsil, unspecified: Secondary | ICD-10-CM

## 2016-07-15 DIAGNOSIS — N182 Chronic kidney disease, stage 2 (mild): Secondary | ICD-10-CM | POA: Diagnosis not present

## 2016-07-15 DIAGNOSIS — R5381 Other malaise: Secondary | ICD-10-CM | POA: Insufficient documentation

## 2016-07-15 DIAGNOSIS — Z5112 Encounter for antineoplastic immunotherapy: Secondary | ICD-10-CM

## 2016-07-15 DIAGNOSIS — R0981 Nasal congestion: Secondary | ICD-10-CM

## 2016-07-15 LAB — CBC WITH DIFFERENTIAL/PLATELET
BASO%: 0.2 % (ref 0.0–2.0)
Basophils Absolute: 0 10*3/uL (ref 0.0–0.1)
EOS ABS: 0.2 10*3/uL (ref 0.0–0.5)
EOS%: 3.5 % (ref 0.0–7.0)
HCT: 39.9 % (ref 38.4–49.9)
HGB: 13 g/dL (ref 13.0–17.1)
LYMPH%: 15.9 % (ref 14.0–49.0)
MCH: 29.1 pg (ref 27.2–33.4)
MCHC: 32.6 g/dL (ref 32.0–36.0)
MCV: 89.5 fL (ref 79.3–98.0)
MONO#: 0.4 10*3/uL (ref 0.1–0.9)
MONO%: 7.1 % (ref 0.0–14.0)
NEUT#: 4.2 10*3/uL (ref 1.5–6.5)
NEUT%: 73.3 % (ref 39.0–75.0)
Platelets: 203 10*3/uL (ref 140–400)
RBC: 4.46 10*6/uL (ref 4.20–5.82)
RDW: 13.7 % (ref 11.0–14.6)
WBC: 5.7 10*3/uL (ref 4.0–10.3)
lymph#: 0.9 10*3/uL (ref 0.9–3.3)

## 2016-07-15 LAB — COMPREHENSIVE METABOLIC PANEL
ALK PHOS: 61 U/L (ref 40–150)
ALT: 17 U/L (ref 0–55)
AST: 22 U/L (ref 5–34)
Albumin: 4 g/dL (ref 3.5–5.0)
Anion Gap: 9 mEq/L (ref 3–11)
BUN: 17.7 mg/dL (ref 7.0–26.0)
CHLORIDE: 106 meq/L (ref 98–109)
CO2: 24 meq/L (ref 22–29)
Calcium: 9.4 mg/dL (ref 8.4–10.4)
Creatinine: 1.8 mg/dL — ABNORMAL HIGH (ref 0.7–1.3)
EGFR: 52 mL/min/{1.73_m2} — AB (ref >90)
GLUCOSE: 83 mg/dL (ref 70–140)
POTASSIUM: 4.4 meq/L (ref 3.5–5.1)
SODIUM: 139 meq/L (ref 136–145)
Total Bilirubin: 0.61 mg/dL (ref 0.20–1.20)
Total Protein: 7.2 g/dL (ref 6.4–8.3)

## 2016-07-15 LAB — TSH: TSH: 4.076 m(IU)/L (ref 0.320–4.118)

## 2016-07-15 MED ORDER — HEPARIN SOD (PORK) LOCK FLUSH 100 UNIT/ML IV SOLN
500.0000 [IU] | Freq: Once | INTRAVENOUS | Status: AC | PRN
  Administered 2016-07-15: 500 [IU]
  Filled 2016-07-15: qty 5

## 2016-07-15 MED ORDER — FLUTICASONE PROPIONATE 50 MCG/ACT NA SUSP: 2.0000 | Freq: Every day | NASAL | 2 refills | Status: DC

## 2016-07-15 MED ORDER — OXYCODONE HCL 15 MG PO TABS: 15.0000 mg | ORAL_TABLET | ORAL | 0 refills | Status: DC | PRN

## 2016-07-15 MED ORDER — SODIUM CHLORIDE 0.9% FLUSH
10.0000 mL | INTRAVENOUS | Status: DC | PRN
  Administered 2016-07-15: 10 mL
  Filled 2016-07-15: qty 10

## 2016-07-15 MED ORDER — SODIUM CHLORIDE 0.9 % IV SOLN
200.0000 mg | Freq: Once | INTRAVENOUS | Status: AC
  Administered 2016-07-15: 200 mg via INTRAVENOUS
  Filled 2016-07-15: qty 8

## 2016-07-15 MED ORDER — SODIUM CHLORIDE 0.9 % IJ SOLN
10.0000 mL | INTRAMUSCULAR | Status: DC | PRN
  Administered 2016-07-15: 10 mL via INTRAVENOUS
  Filled 2016-07-15: qty 10

## 2016-07-15 MED ORDER — SODIUM CHLORIDE 0.9 % IV SOLN
Freq: Once | INTRAVENOUS | Status: AC
  Administered 2016-07-15: 11:00:00 via INTRAVENOUS

## 2016-07-15 MED FILL — FLUTICASONE PROP 50 MCG SPR: 50 | 30 days supply | Qty: 16 | Fill #0

## 2016-07-15 MED FILL — oxyCODONE HCL 15 MG TABS: 15 | 15 days supply | Qty: 90 | Fill #0

## 2016-07-15 NOTE — Assessment & Plan Note (Signed)
He tolerated recent chemotherapy well without any side effects. I will resume treatment today. I will continue to see him on a monthly basis. His next set of imaging study would be due in April 2018

## 2016-07-15 NOTE — Progress Notes (Signed)
Per Sondra Barges, RN, per Dr. Alvy Bimler, okay to treat today with crt of 1.8. Per Dr. Kerby Moors patient is to increase oral fluid intake; patient notified and verbalized understanding.

## 2016-07-15 NOTE — Patient Instructions (Signed)
[facility] Discharge Instructions for Patients Receiving Chemotherapy  Today you received the following chemotherapy agents: Kyprolis   To help prevent nausea and vomiting after your treatment, we encourage you to take your nausea medication as directed.    If you develop nausea and vomiting that is not controlled by your nausea medication, call the clinic.   BELOW ARE SYMPTOMS THAT SHOULD BE REPORTED IMMEDIATELY:  *FEVER GREATER THAN 100.5 F  *CHILLS WITH OR WITHOUT FEVER  NAUSEA AND VOMITING THAT IS NOT CONTROLLED WITH YOUR NAUSEA MEDICATION  *UNUSUAL SHORTNESS OF BREATH  *UNUSUAL BRUISING OR BLEEDING  TENDERNESS IN MOUTH AND THROAT WITH OR WITHOUT PRESENCE OF ULCERS  *URINARY PROBLEMS  *BOWEL PROBLEMS  UNUSUAL RASH Items with * indicate a potential emergency and should be followed up as soon as possible.  Feel free to call the clinic you have any questions or concerns. The clinic phone number is [phone].  Please show the CHEMO ALERT CARD at check-in to the Emergency Department and triage nurse.   

## 2016-07-15 NOTE — Progress Notes (Signed)
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 FNA of right level III lymph node was inconclusive for cancer      07/25/2013 Imaging    PET scan showed locally advanced disease with abnormal lymphadenopathy in the right axilla      08/06/2013 Procedure    CT-guided biopsy of the lymphadenopathy was negative for malignancy      08/15/2013 Surgery    Patient has placement of port and feeding tube      08/19/2013 - 09/10/2013 Chemotherapy    Patient received chemotherapy with cisplatin. The patient only received 2 doses due to uncontrolled nausea and acute renal failure.      08/19/2013 - 10/15/2013 Radiation Therapy    Received Helical IMRT Tomotherapy:  Right Tonstil and bilateral neck / 70 Gy in 35 fractions to gross disease, 63 Gy in 35 fractions to high risk nodal echelons, and 56 Gy in 35 fractions to intermediate risk nodal echelons.      08/27/2013 - 08/30/2013 Hospital Admission    The patient was admitted to the hospital for uncontrolled nausea vomiting and dehydration.      02/14/2014 Imaging    PET/CT scan showed complete response to treatment      03/19/2014 Surgery    He had excisional  lymph node biopsy from the right neck. Pathology was benign      05/13/2014 Surgery    He had removal of Port-A-Cath.      05/22/2014 Imaging    Repeat CT scan of the neck show no evidence of disease recurrence.      12/09/2014 Imaging    Ct neck without contrast showed persistent abnormalities on the right side of neck, indeterminate      12/25/2014 Imaging    PET CT scan showed disease recurrence.      02/10/2015 Procedure    He has placement of port      02/13/2015 - 07/13/2015 Chemotherapy    He received chemotherapy with carbo/Taxol      04/21/2015 Imaging    PET CT scan showed improved disease control      08/04/2015 Imaging    PET scan showed persistent disease      08/17/2015 -  Chemotherapy    He was started with [facility]      10/19/2015 Imaging    Ct neck showed mass-like intermediate density soft tissue at the right lateral neck recurrence site stable.       02/26/2016 Imaging    CT neck showed unchanged appearance of right neck recurrence compared to 10/19/2015 CT. No noncontrast evidence of new metastatic disease in the neck. Chronic sinusitis, progressed.      02/26/2016 Imaging    Diffuse but  patchy and asymmetric partial airspace filling process (ground-glass opacity) in the lungs. This could be due to respiratory bronchiolitis, hypersensitivity pneumonitis or possible drug reaction. Atypical/viral pneumonia is also possible. Pulmonary consultation may be a helpful. A three-month follow-up noncontrast chest CT is suggested. Slight interval enlargement of mediastinal lymph nodes and a small lymph node along the left major fissure. This is most likely due to the inflammatory process in the lungs. No findings for metastatic disease involving the chest. No findings for upper abdominal metastatic disease.      02/29/2016 Adverse Reaction    His treatment is placed on hold due to possible hypersensitivity pneumonitis/drug reaction      04/14/2016 Imaging    Ct  chest showed no evidence for metastatic disease within the chest. Significant interval improvement and near complete resolution of previously described diffuse bilateral predominately ground-glass pulmonary opacities, most compatible with resolving infectious/inflammatory process.       INTERVAL HISTORY: Please see below for problem oriented charting. 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  REVIEW OF SYSTEMS:   Constitutional: Denies fevers, chills or abnormal weight loss Eyes: Denies blurriness of vision Ears, nose, mouth, throat, and face: Denies mucositis or sore throat Respiratory: Denies cough, dyspnea or wheezes Cardiovascular: Denies palpitation, chest discomfort or lower extremity swelling Gastrointestinal:  Denies nausea, heartburn or change in bowel habits Skin: Denies abnormal skin rashes Lymphatics: Denies new lymphadenopathy or easy bruising Neurological:Denies numbness, tingling or new weaknesses Behavioral/Psych: Mood is stable, no new changes  All other systems were reviewed with the patient and are negative.  I have reviewed the past medical history, past surgical history, social history and family history with the patient and they are unchanged from previous note.  ALLERGIES:  is allergic to pollen extract.  MEDICATIONS:  Current Outpatient Prescriptions  Medication Sig Dispense Refill  . lidocaine-prilocaine (EMLA) cream Apply 1 application topically as needed. 30 g 6  . Multiple Vitamin (MULTIVITAMIN WITH MINERALS) TABS tablet Take 1 tablet by mouth daily.    Marland Kitchen omeprazole (PRILOSEC) 20 MG capsule Take 1  capsule (20 mg total) by mouth daily. 30 capsule 0  . oxyCODONE (ROXICODONE) 15 MG immediate release tablet Take 1 tablet (15 mg total) by mouth every 4 (four) hours as needed. 90 tablet 0  . traZODone (DESYREL) 150 MG tablet Take 1 tablet (150 mg total) by mouth at bedtime. 30 tablet 11  . fluticasone (FLONASE) 50 MCG/ACT nasal spray Place 2 sprays into both nostrils daily. 16 g 2  . ondansetron (ZOFRAN ODT) 4 MG disintegrating tablet Take 1 tablet (4 mg total) by mouth every 8 (eight) hours as needed for nausea or vomiting. (Patient not taking: Reported on 07/15/2016) 10 tablet 0  . ondansetron (ZOFRAN) 8 MG tablet Take 1 tablet (8 mg total) by mouth every 8 (eight) hours as needed for nausea or vomiting. (Patient not taking: Reported on 07/15/2016) 90 tablet 1  . prochlorperazine (COMPAZINE) 10 MG tablet Take 1 tablet (10 mg total) by mouth every 6 (six) hours as needed (Nausea or vomiting). (Patient not taking: Reported on 07/15/2016) 60 tablet 1   No current facility-administered medications for this  visit.    Facility-Administered Medications Ordered in Other Visits  Medication Dose Route Frequency Provider Last Rate Last Dose  . sodium chloride flush (NS) 0.9 % injection 10 mL  10 mL Intracatheter PRN [name], MD   10 mL at 07/15/16 1232    PHYSICAL EXAMINATION: ECOG PERFORMANCE STATUS: 1 - Symptomatic but completely ambulatory  Vitals:   07/15/16 1033  BP: (!) 146/88  Pulse: 86  Resp: 18  Temp: 98.5 F (36.9 C)   Filed Weights   07/15/16 1033  Weight: 276 lb 9.6 oz (125.5 kg)    GENERAL:alert, no distress and comfortable SKIN: skin color, texture, turgor are normal, no rashes or significant lesions EYES: normal, Conjunctiva are pink and non-injected, sclera clear OROPHARYNX:no exudate, no erythema and lips, buccal mucosa, and tongue normal  NECK: Well-healed surgical scar. LYMPH:  no palpable lymphadenopathy in the cervical, axillary or inguinal LUNGS: clear to  auscultation and percussion with normal breathing effort HEART: regular rate & rhythm and no murmurs and no lower extremity edema ABDOMEN:abdomen soft, non-tender and normal bowel sounds Musculoskeletal:no cyanosis of digits and no clubbing  NEURO: alert & oriented x 3 with fluent speech, no focal motor/sensory deficits  LABORATORY DATA:  I have reviewed the data as listed    Component Value Date/Time   NA 139 07/15/2016 1004   K 4.4 07/15/2016 1004   CL 107 07/23/2015 1513   CO2 24 07/15/2016 1004   GLUCOSE 83 07/15/2016 1004   BUN 17.7 07/15/2016 1004   CREATININE 1.8 (H) 07/15/2016 1004   CALCIUM 9.4 07/15/2016 1004   PROT 7.2 07/15/2016 1004   ALBUMIN 4.0 07/15/2016 1004   AST 22 07/15/2016 1004   ALT 17 07/15/2016 1004   ALKPHOS 61 07/15/2016 1004   BILITOT 0.61 07/15/2016 1004   GFRNONAA 44 (L) 07/23/2015 1513   GFRAA 51 (L) 07/23/2015 1513    No results found for: SPEP, UPEP  Lab Results  Component Value Date   WBC 5.7 07/15/2016   NEUTROABS 4.2 07/15/2016   HGB 13.0 07/15/2016   HCT 39.9 07/15/2016   MCV 89.5 07/15/2016   PLT 203 07/15/2016      Chemistry      Component Value Date/Time   NA 139 07/15/2016 1004   K 4.4 07/15/2016 1004   CL 107 07/23/2015 1513   CO2 24 07/15/2016 1004   BUN 17.7 07/15/2016 1004   CREATININE 1.8 (H) 07/15/2016 1004      Component Value Date/Time   CALCIUM 9.4 07/15/2016 1004   ALKPHOS 61 07/15/2016 1004   AST 22 07/15/2016 1004   ALT 17 07/15/2016 1004   BILITOT 0.61 07/15/2016 1004      ASSESSMENT & PLAN:  Tonsillar cancer He tolerated recent chemotherapy well without any side effects. I will resume treatment today. I will continue to see him on a monthly basis. His next set of imaging study would be due in April 2018  Chronic kidney disease (CKD), stage II (mild) His kidney function is stable. As long as his creatinine clearance remain greater than 30, we will continue chemotherapy Monitor only  Cancer  associated pain The patient has cancer associated pain. His pain is intermittent in nature and I reassured the patient it is not due to cancer progression I recommend we continue the dose of oxycodone to 15 mg every 4 hours as needed for pain  Debility The patient has been debilitated for a long time since cancer diagnosis Overall, he tolerated treatment very well  He has gained back some energy and desire to return back to work He described his recent job application. He is currently a certified peer support specialist He is what would require him to travel to [place] occasionally and to patient's home to provide counseling I recommend no more than 20 hours per week if possible   No orders of the defined types were placed in this encounter.  All questions were answered. The patient knows to call the clinic with any problems, questions or concerns. No barriers to learning was detected. I spent 25 minutes counseling the patient face to face. The total time spent in the appointment was 30 minutes and more than 50% was on counseling and review of test results     [name], MD 07/15/2016 2:55 PM

## 2016-07-15 NOTE — Patient Instructions (Signed)
Implanted Port Home Guide An implanted port is a type of central line that is placed under the skin. Central lines are used to provide IV access when treatment or nutrition needs to be given through a person's veins. Implanted ports are used for long-term IV access. An implanted port may be placed because:  You need IV medicine that would be irritating to the small veins in your hands or arms.  You need long-term IV medicines, such as antibiotics.  You need IV nutrition for a long period.  You need frequent blood draws for lab tests.  You need dialysis.  Implanted ports are usually placed in the chest area, but they can also be placed in the upper arm, the abdomen, or the leg. An implanted port has two main parts:  Reservoir. The reservoir is round and will appear as a small, raised area under your skin. The reservoir is the part where a needle is inserted to give medicines or draw blood.  Catheter. The catheter is a thin, flexible tube that extends from the reservoir. The catheter is placed into a large vein. Medicine that is inserted into the reservoir goes into the catheter and then into the vein.  How will I care for my incision site? Do not get the incision site wet. Bathe or shower as directed by your health care provider. How is my port accessed? Special steps must be taken to access the port:  Before the port is accessed, a numbing cream can be placed on the skin. This helps numb the skin over the port site.  Your health care provider uses a sterile technique to access the port. ? Your health care provider must put on a mask and sterile gloves. ? The skin over your port is cleaned carefully with an antiseptic and allowed to dry. ? The port is gently pinched between sterile gloves, and a needle is inserted into the port.  Only "non-coring" port needles should be used to access the port. Once the port is accessed, a blood return should be checked. This helps ensure that the port  is in the vein and is not clogged.  If your port needs to remain accessed for a constant infusion, a clear (transparent) bandage will be placed over the needle site. The bandage and needle will need to be changed every week, or as directed by your health care provider.  Keep the bandage covering the needle clean and dry. Do not get it wet. Follow your health care provider's instructions on how to take a shower or bath while the port is accessed.  If your port does not need to stay accessed, no bandage is needed over the port.  What is flushing? Flushing helps keep the port from getting clogged. Follow your health care provider's instructions on how and when to flush the port. Ports are usually flushed with saline solution or a medicine called heparin. The need for flushing will depend on how the port is used.  If the port is used for intermittent medicines or blood draws, the port will need to be flushed: ? After medicines have been given. ? After blood has been drawn. ? As part of routine maintenance.  If a constant infusion is running, the port may not need to be flushed.  How long will my port stay implanted? The port can stay in for as long as your health care provider thinks it is needed. When it is time for the port to come out, surgery will be   done to remove it. The procedure is similar to the one performed when the port was put in. When should I seek immediate medical care? When you have an implanted port, you should seek immediate medical care if:  You notice a bad smell coming from the incision site.  You have swelling, redness, or drainage at the incision site.  You have more swelling or pain at the port site or the surrounding area.  You have a fever that is not controlled with medicine.  This information is not intended to replace advice given to you by your health care provider. Make sure you discuss any questions you have with your health care provider. Document  Released: 04/04/2005 Document Revised: 09/10/2015 Document Reviewed: 12/10/2012 Elsevier Interactive Patient Education  2017 [employer]  

## 2016-07-15 NOTE — Assessment & Plan Note (Signed)
The patient has cancer associated pain. His pain is intermittent in nature and I reassured the patient it is not due to cancer progression I recommend we continue the dose of oxycodone to 15 mg every 4 hours as needed for pain

## 2016-07-15 NOTE — Assessment & Plan Note (Signed)
The patient has been debilitated for a long time since cancer diagnosis Overall, he tolerated treatment very well He has gained back some energy and desire to return back to work He described his recent job application. He is currently a certified peer support specialist He is what would require him to travel to [place] occasionally and to patient's home to provide counseling I recommend no more than 20 hours per week if possible

## 2016-07-15 NOTE — Assessment & Plan Note (Signed)
His kidney function is stable. As long as his creatinine clearance remain greater than 30, we will continue chemotherapy Monitor only 

## 2016-07-21 ENCOUNTER — Telehealth: Payer: Self-pay | Admitting: Hematology and Oncology

## 2016-07-21 NOTE — Telephone Encounter (Signed)
Spoke with patient re 4/24 appointments

## 2016-08-09 ENCOUNTER — Encounter: Payer: Self-pay | Admitting: Hematology and Oncology

## 2016-08-09 ENCOUNTER — Telehealth: Payer: Self-pay | Admitting: Hematology and Oncology

## 2016-08-09 ENCOUNTER — Ambulatory Visit (HOSPITAL_BASED_OUTPATIENT_CLINIC_OR_DEPARTMENT_OTHER): Payer: Medicaid Other | Admitting: Hematology and Oncology

## 2016-08-09 ENCOUNTER — Ambulatory Visit (HOSPITAL_BASED_OUTPATIENT_CLINIC_OR_DEPARTMENT_OTHER): Payer: Medicaid Other

## 2016-08-09 ENCOUNTER — Ambulatory Visit: Payer: Medicaid Other

## 2016-08-09 ENCOUNTER — Other Ambulatory Visit (HOSPITAL_BASED_OUTPATIENT_CLINIC_OR_DEPARTMENT_OTHER): Payer: Medicaid Other

## 2016-08-09 ENCOUNTER — Other Ambulatory Visit: Payer: Self-pay | Admitting: Hematology and Oncology

## 2016-08-09 VITALS — BP 130/87 | HR 78 | Temp 97.9°F | Resp 18 | Ht 74.0 in | Wt 280.2 lb

## 2016-08-09 DIAGNOSIS — N182 Chronic kidney disease, stage 2 (mild): Secondary | ICD-10-CM | POA: Diagnosis not present

## 2016-08-09 DIAGNOSIS — G893 Neoplasm related pain (acute) (chronic): Secondary | ICD-10-CM

## 2016-08-09 DIAGNOSIS — C099 Malignant neoplasm of tonsil, unspecified: Secondary | ICD-10-CM

## 2016-08-09 DIAGNOSIS — Z5112 Encounter for antineoplastic immunotherapy: Secondary | ICD-10-CM | POA: Diagnosis present

## 2016-08-09 DIAGNOSIS — R918 Other nonspecific abnormal finding of lung field: Secondary | ICD-10-CM

## 2016-08-09 DIAGNOSIS — F1210 Cannabis abuse, uncomplicated: Secondary | ICD-10-CM

## 2016-08-09 DIAGNOSIS — Z5111 Encounter for antineoplastic chemotherapy: Secondary | ICD-10-CM

## 2016-08-09 DIAGNOSIS — Z79899 Other long term (current) drug therapy: Secondary | ICD-10-CM

## 2016-08-09 LAB — CBC WITH DIFFERENTIAL/PLATELET
BASO%: 0.6 % (ref 0.0–2.0)
BASOS ABS: 0 10*3/uL (ref 0.0–0.1)
EOS%: 2.2 % (ref 0.0–7.0)
Eosinophils Absolute: 0.1 10*3/uL (ref 0.0–0.5)
HCT: 40.1 % (ref 38.4–49.9)
HGB: 13.3 g/dL (ref 13.0–17.1)
LYMPH#: 0.8 10*3/uL — AB (ref 0.9–3.3)
LYMPH%: 13.5 % — ABNORMAL LOW (ref 14.0–49.0)
MCH: 29.6 pg (ref 27.2–33.4)
MCHC: 33.2 g/dL (ref 32.0–36.0)
MCV: 89.2 fL (ref 79.3–98.0)
MONO#: 0.4 10*3/uL (ref 0.1–0.9)
MONO%: 5.9 % (ref 0.0–14.0)
NEUT%: 77.8 % — ABNORMAL HIGH (ref 39.0–75.0)
NEUTROS ABS: 4.7 10*3/uL (ref 1.5–6.5)
Platelets: 196 10*3/uL (ref 140–400)
RBC: 4.5 10*6/uL (ref 4.20–5.82)
RDW: 14.5 % (ref 11.0–14.6)
WBC: 6.1 10*3/uL (ref 4.0–10.3)

## 2016-08-09 LAB — COMPREHENSIVE METABOLIC PANEL
ALT: 26 U/L (ref 0–55)
AST: 30 U/L (ref 5–34)
Albumin: 4 g/dL (ref 3.5–5.0)
Alkaline Phosphatase: 66 U/L (ref 40–150)
Anion Gap: 10 mEq/L (ref 3–11)
BUN: 15.2 mg/dL (ref 7.0–26.0)
CO2: 26 meq/L (ref 22–29)
Calcium: 9.5 mg/dL (ref 8.4–10.4)
Chloride: 105 mEq/L (ref 98–109)
Creatinine: 1.8 mg/dL — ABNORMAL HIGH (ref 0.7–1.3)
EGFR: 52 mL/min/{1.73_m2} — AB (ref >90)
GLUCOSE: 83 mg/dL (ref 70–140)
Potassium: 4.5 mEq/L (ref 3.5–5.1)
SODIUM: 141 meq/L (ref 136–145)
Total Bilirubin: 0.44 mg/dL (ref 0.20–1.20)
Total Protein: 7.2 g/dL (ref 6.4–8.3)

## 2016-08-09 LAB — TSH: TSH: 4.721 m(IU)/L — ABNORMAL HIGH (ref 0.320–4.118)

## 2016-08-09 MED ORDER — SODIUM CHLORIDE 0.9% FLUSH
10.0000 mL | INTRAVENOUS | Status: DC | PRN
  Administered 2016-08-09: 10 mL
  Filled 2016-08-09: qty 10

## 2016-08-09 MED ORDER — SODIUM CHLORIDE 0.9 % IV SOLN
Freq: Once | INTRAVENOUS | Status: AC
  Administered 2016-08-09: 15:00:00 via INTRAVENOUS

## 2016-08-09 MED ORDER — OXYCODONE HCL 15 MG PO TABS: 15.0000 mg | ORAL_TABLET | ORAL | 0 refills | Status: DC | PRN

## 2016-08-09 MED ORDER — SODIUM CHLORIDE 0.9 % IJ SOLN
10.0000 mL | INTRAMUSCULAR | Status: DC | PRN
  Administered 2016-08-09: 10 mL via INTRAVENOUS
  Filled 2016-08-09: qty 10

## 2016-08-09 MED ORDER — HEPARIN SOD (PORK) LOCK FLUSH 100 UNIT/ML IV SOLN
500.0000 [IU] | Freq: Once | INTRAVENOUS | Status: AC | PRN
  Administered 2016-08-09: 500 [IU]
  Filled 2016-08-09: qty 5

## 2016-08-09 MED ORDER — SODIUM CHLORIDE 0.9 % IV SOLN
200.0000 mg | Freq: Once | INTRAVENOUS | Status: AC
  Administered 2016-08-09: 200 mg via INTRAVENOUS
  Filled 2016-08-09: qty 8

## 2016-08-09 MED FILL — oxyCODONE HCL 15 MG TABS: 15 | 15 days supply | Qty: 90 | Fill #0

## 2016-08-09 NOTE — Assessment & Plan Note (Signed)
The patient has cancer associated pain. His pain is intermittent in nature and I reassured the patient it is not due to cancer progression I recommend we continue the dose of oxycodone to 15 mg every 4 hours as needed for pain

## 2016-08-09 NOTE — Patient Instructions (Signed)
Implanted Port Home Guide An implanted port is a type of central line that is placed under the skin. Central lines are used to provide IV access when treatment or nutrition needs to be given through a person's veins. Implanted ports are used for long-term IV access. An implanted port may be placed because:  You need IV medicine that would be irritating to the small veins in your hands or arms.  You need long-term IV medicines, such as antibiotics.  You need IV nutrition for a long period.  You need frequent blood draws for lab tests.  You need dialysis.  Implanted ports are usually placed in the chest area, but they can also be placed in the upper arm, the abdomen, or the leg. An implanted port has two main parts:  Reservoir. The reservoir is round and will appear as a small, raised area under your skin. The reservoir is the part where a needle is inserted to give medicines or draw blood.  Catheter. The catheter is a thin, flexible tube that extends from the reservoir. The catheter is placed into a large vein. Medicine that is inserted into the reservoir goes into the catheter and then into the vein.  How will I care for my incision site? Do not get the incision site wet. Bathe or shower as directed by your health care provider. How is my port accessed? Special steps must be taken to access the port:  Before the port is accessed, a numbing cream can be placed on the skin. This helps numb the skin over the port site.  Your health care provider uses a sterile technique to access the port. ? Your health care provider must put on a mask and sterile gloves. ? The skin over your port is cleaned carefully with an antiseptic and allowed to dry. ? The port is gently pinched between sterile gloves, and a needle is inserted into the port.  Only "non-coring" port needles should be used to access the port. Once the port is accessed, a blood return should be checked. This helps ensure that the port  is in the vein and is not clogged.  If your port needs to remain accessed for a constant infusion, a clear (transparent) bandage will be placed over the needle site. The bandage and needle will need to be changed every week, or as directed by your health care provider.  Keep the bandage covering the needle clean and dry. Do not get it wet. Follow your health care provider's instructions on how to take a shower or bath while the port is accessed.  If your port does not need to stay accessed, no bandage is needed over the port.  What is flushing? Flushing helps keep the port from getting clogged. Follow your health care provider's instructions on how and when to flush the port. Ports are usually flushed with saline solution or a medicine called heparin. The need for flushing will depend on how the port is used.  If the port is used for intermittent medicines or blood draws, the port will need to be flushed: ? After medicines have been given. ? After blood has been drawn. ? As part of routine maintenance.  If a constant infusion is running, the port may not need to be flushed.  How long will my port stay implanted? The port can stay in for as long as your health care provider thinks it is needed. When it is time for the port to come out, surgery will be   done to remove it. The procedure is similar to the one performed when the port was put in. When should I seek immediate medical care? When you have an implanted port, you should seek immediate medical care if:  You notice a bad smell coming from the incision site.  You have swelling, redness, or drainage at the incision site.  You have more swelling or pain at the port site or the surrounding area.  You have a fever that is not controlled with medicine.  This information is not intended to replace advice given to you by your health care provider. Make sure you discuss any questions you have with your health care provider. Document  Released: 04/04/2005 Document Revised: 09/10/2015 Document Reviewed: 12/10/2012 Elsevier Interactive Patient Education  2017 [employer]  

## 2016-08-09 NOTE — Assessment & Plan Note (Signed)
His kidney function is stable. As long as his creatinine clearance remain greater than 30, we will continue chemotherapy Monitor only 

## 2016-08-09 NOTE — Assessment & Plan Note (Signed)
The patient has mild withdrawal symptoms recently He has been using illicit marijuana in the background as appetite stimulant I reminded the patient he is not malnourished and the danger of substance abuse The patient is quitting

## 2016-08-09 NOTE — Telephone Encounter (Signed)
Gave patient AVS and calender per 4/24 los.  

## 2016-08-09 NOTE — Assessment & Plan Note (Signed)
He tolerated recent chemotherapy well without any side effects. I will continue to see him on a monthly basis with treatment His next set of imaging study would be due in May 2018

## 2016-08-09 NOTE — Patient Instructions (Signed)
[facility] Discharge Instructions for Patients Receiving Chemotherapy  Today you received the following chemotherapy agents Keytruda  To help prevent nausea and vomiting after your treatment, we encourage you to take your nausea medication    If you develop nausea and vomiting that is not controlled by your nausea medication, call the clinic.   BELOW ARE SYMPTOMS THAT SHOULD BE REPORTED IMMEDIATELY:  *FEVER GREATER THAN 100.5 F  *CHILLS WITH OR WITHOUT FEVER  NAUSEA AND VOMITING THAT IS NOT CONTROLLED WITH YOUR NAUSEA MEDICATION  *UNUSUAL SHORTNESS OF BREATH  *UNUSUAL BRUISING OR BLEEDING  TENDERNESS IN MOUTH AND THROAT WITH OR WITHOUT PRESENCE OF ULCERS  *URINARY PROBLEMS  *BOWEL PROBLEMS  UNUSUAL RASH Items with * indicate a potential emergency and should be followed up as soon as possible.  Feel free to call the clinic you have any questions or concerns. The clinic phone number is [phone].  Please show the CHEMO ALERT CARD at check-in to the Emergency Department and triage nurse.   

## 2016-08-09 NOTE — Progress Notes (Signed)
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 FNA of right level III lymph node was inconclusive for cancer      07/25/2013 Imaging    PET scan showed locally advanced disease with abnormal lymphadenopathy in the right axilla      08/06/2013 Procedure    CT-guided biopsy of the lymphadenopathy was negative for malignancy      08/15/2013 Surgery    Patient has placement of port and feeding tube      08/19/2013 - 09/10/2013 Chemotherapy    Patient received chemotherapy with cisplatin. The patient only received 2 doses due to uncontrolled nausea and acute renal failure.      08/19/2013 - 10/15/2013 Radiation Therapy    Received Helical IMRT Tomotherapy:  Right Tonstil and bilateral neck / 70 Gy in 35 fractions to gross disease, 63 Gy in 35 fractions to high risk nodal echelons, and 56 Gy in 35 fractions to intermediate risk nodal echelons.      08/27/2013 - 08/30/2013 Hospital Admission    The patient was admitted to the hospital for uncontrolled nausea vomiting and dehydration.      02/14/2014 Imaging    PET/CT scan showed complete response to treatment      03/19/2014 Surgery    He had excisional  lymph node biopsy from the right neck. Pathology was benign      05/13/2014 Surgery    He had removal of Port-A-Cath.      05/22/2014 Imaging    Repeat CT scan of the neck show no evidence of disease recurrence.      12/09/2014 Imaging    Ct neck without contrast showed persistent abnormalities on the right side of neck, indeterminate      12/25/2014 Imaging    PET CT scan showed disease recurrence.      02/10/2015 Procedure    He has placement of port      02/13/2015 - 07/13/2015 Chemotherapy    He received chemotherapy with carbo/Taxol      04/21/2015 Imaging    PET CT scan showed improved disease control      08/04/2015 Imaging    PET scan showed persistent disease      08/17/2015 -  Chemotherapy    He was started with [facility]      10/19/2015 Imaging    Ct neck showed mass-like intermediate density soft tissue at the right lateral neck recurrence site stable.       02/26/2016 Imaging    CT neck showed unchanged appearance of right neck recurrence compared to 10/19/2015 CT. No noncontrast evidence of new metastatic disease in the neck. Chronic sinusitis, progressed.      02/26/2016 Imaging    Diffuse but  patchy and asymmetric partial airspace filling process (ground-glass opacity) in the lungs. This could be due to respiratory bronchiolitis, hypersensitivity pneumonitis or possible drug reaction. Atypical/viral pneumonia is also possible. Pulmonary consultation may be a helpful. A three-month follow-up noncontrast chest CT is suggested. Slight interval enlargement of mediastinal lymph nodes and a small lymph node along the left major fissure. This is most likely due to the inflammatory process in the lungs. No findings for metastatic disease involving the chest. No findings for upper abdominal metastatic disease.      02/29/2016 Adverse Reaction    His treatment is placed on hold due to possible hypersensitivity pneumonitis/drug reaction      04/14/2016 Imaging    Ct  chest showed no evidence for metastatic disease within the chest. Significant interval improvement and near complete resolution of previously described diffuse bilateral predominately ground-glass pulmonary opacities, most compatible with resolving infectious/inflammatory process.       INTERVAL HISTORY: Please see below for problem oriented charting. He returns for his monthly follow-up and chemotherapy He continues to have intermittent neck pain He has been abusing marijuana recently but decided to stop He has some mild withdrawal symptoms No recent significant nausea or vomiting Denies recent cough, chest pain or shortness of breath  REVIEW OF SYSTEMS:   Constitutional: Denies fevers, chills or abnormal weight loss Eyes: Denies blurriness of vision Ears, nose, mouth, throat, and face: Denies mucositis or sore throat Respiratory: Denies cough, dyspnea or wheezes Cardiovascular: Denies palpitation, chest discomfort or lower extremity swelling Gastrointestinal:  Denies nausea, heartburn or change in bowel habits Skin: Denies abnormal skin rashes Lymphatics: Denies new lymphadenopathy or easy bruising Neurological:Denies numbness, tingling or new weaknesses Behavioral/Psych: Mood is stable, no new changes  All other systems were reviewed with the patient and are negative.  I have reviewed the past medical history, past surgical history, social history and family history with the patient and they are unchanged from previous note.  ALLERGIES:  is allergic to pollen extract.  MEDICATIONS:  Current Outpatient Prescriptions  Medication Sig Dispense Refill  . fluticasone (FLONASE) 50 MCG/ACT nasal spray Place 2 sprays into both nostrils daily. 16 g 2  . lidocaine-prilocaine (EMLA) cream Apply 1 application topically as needed. 30 g 6  . Multiple Vitamin (MULTIVITAMIN WITH MINERALS) TABS tablet Take 1 tablet by mouth daily.    Marland Kitchen omeprazole (PRILOSEC) 20 MG capsule Take 1 capsule (20 mg  total) by mouth daily. 30 capsule 0  . ondansetron (ZOFRAN ODT) 4 MG disintegrating tablet Take 1 tablet (4 mg total) by mouth every 8 (eight) hours as needed for nausea or vomiting. (Patient not taking: Reported on 07/15/2016) 10 tablet 0  . ondansetron (ZOFRAN) 8 MG tablet Take 1 tablet (8 mg total) by mouth every 8 (eight) hours as needed for nausea or vomiting. (Patient not taking: Reported on 07/15/2016) 90 tablet 1  . oxyCODONE (ROXICODONE) 15 MG immediate release tablet Take 1 tablet (15 mg total) by mouth every 4 (four) hours as needed. 90 tablet 0  . prochlorperazine (COMPAZINE) 10 MG tablet Take 1 tablet (10 mg total) by mouth every 6 (six) hours as needed (Nausea or vomiting). (Patient not taking: Reported on 07/15/2016) 60 tablet 1  . traZODone (DESYREL) 150 MG tablet Take 1 tablet (150 mg total) by mouth at bedtime. 30 tablet 11   No current facility-administered medications for this visit.     PHYSICAL EXAMINATION: ECOG PERFORMANCE STATUS: 1 - Symptomatic but completely ambulatory  Vitals:   08/09/16  1313  BP: 130/87  Pulse: 78  Resp: 18  Temp: 97.9 F (36.6 C)   Filed Weights   08/09/16 1313  Weight: 280 lb 3.2 oz (127.1 kg)    GENERAL:alert, no distress and comfortable SKIN: skin color, texture, turgor are normal, no rashes or significant lesions EYES: normal, Conjunctiva are pink and non-injected, sclera clear OROPHARYNX:no exudate, no erythema and lips, buccal mucosa, and tongue normal  NECK: noted previous surgical scar LYMPH:  no palpable lymphadenopathy in the cervical, axillary or inguinal LUNGS: clear to auscultation and percussion with normal breathing effort HEART: regular rate & rhythm and no murmurs and no lower extremity edema ABDOMEN:abdomen soft, non-tender and normal bowel sounds Musculoskeletal:no cyanosis of digits and no clubbing  NEURO: alert & oriented x 3 with fluent speech, no focal motor/sensory deficits  LABORATORY DATA:  I have reviewed the  data as listed    Component Value Date/Time   NA 141 08/09/2016 1217   K 4.5 08/09/2016 1217   CL 107 07/23/2015 1513   CO2 26 08/09/2016 1217   GLUCOSE 83 08/09/2016 1217   BUN 15.2 08/09/2016 1217   CREATININE 1.8 (H) 08/09/2016 1217   CALCIUM 9.5 08/09/2016 1217   PROT 7.2 08/09/2016 1217   ALBUMIN 4.0 08/09/2016 1217   AST 30 08/09/2016 1217   ALT 26 08/09/2016 1217   ALKPHOS 66 08/09/2016 1217   BILITOT 0.44 08/09/2016 1217   GFRNONAA 44 (L) 07/23/2015 1513   GFRAA 51 (L) 07/23/2015 1513    No results found for: SPEP, UPEP  Lab Results  Component Value Date   WBC 6.1 08/09/2016   NEUTROABS 4.7 08/09/2016   HGB 13.3 08/09/2016   HCT 40.1 08/09/2016   MCV 89.2 08/09/2016   PLT 196 08/09/2016      Chemistry      Component Value Date/Time   NA 141 08/09/2016 1217   K 4.5 08/09/2016 1217   CL 107 07/23/2015 1513   CO2 26 08/09/2016 1217   BUN 15.2 08/09/2016 1217   CREATININE 1.8 (H) 08/09/2016 1217      Component Value Date/Time   CALCIUM 9.5 08/09/2016 1217   ALKPHOS 66 08/09/2016 1217   AST 30 08/09/2016 1217   ALT 26 08/09/2016 1217   BILITOT 0.44 08/09/2016 1217      ASSESSMENT & PLAN:  Tonsillar cancer He tolerated recent chemotherapy well without any side effects. I will continue to see him on a monthly basis with treatment His next set of imaging study would be due in May 2018  Chronic kidney disease (CKD), stage II (mild) His kidney function is stable. 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 His pain is intermittent in nature and I reassured the patient it is not due to  cancer progression I recommend we continue the dose of oxycodone to 15 mg every 4 hours as needed for pain   Orders Placed This Encounter  Procedures  . CT CHEST WO CONTRAST    Standing Status:   Future    Standing Expiration Date:   09/13/2017    Order Specific Question:   Reason for exam:    Answer:  tonsil cancer, lung nodules, assess response to Rx    Order Specific Question:   Preferred imaging location?    Answer:   [facility]  . CT Soft Tissue Neck Wo Contrast    Standing Status:   Future    Standing Expiration Date:   08/09/2017    Order Specific Question:   Reason for Exam (SYMPTOM  OR DIAGNOSIS REQUIRED)    Answer:   tonsil cancer, lung nodules, assess response to Rx    Order Specific Question:   Preferred imaging location?    Answer:   [facility]    Order Specific Question:   Radiology Contrast Protocol - do NOT remove file path    Answer:   [email or web address]   All questions were answered. The patient knows to call the clinic with any problems, questions or concerns. No barriers to learning was detected. I spent 20 minutes counseling the patient face to face. The total time spent in the appointment was 25 minutes and more than 50% was on counseling and review of test results     [name], MD 08/09/2016 1:51 PM

## 2016-08-25 ENCOUNTER — Telehealth: Payer: Self-pay

## 2016-08-25 NOTE — Telephone Encounter (Signed)
Radiology Scheduler to call patient and schedule CT scan.

## 2016-08-25 NOTE — Telephone Encounter (Signed)
-----   Message from Loletta Parish sent at 08/25/2016  9:17 AM EDT ----- Regarding: RE: CT scan CTs  Have been authorized.  Thanks  ----- Message ----- From: [name], MD Sent: 08/23/2016   7:21 AM To: Patton Salles, RN, Flo Shanks, RN, # Subject: CT scan                                        I placed this order a while back, needs to be done on 5/21. Can you please precert and schedule?

## 2016-09-05 ENCOUNTER — Ambulatory Visit (HOSPITAL_COMMUNITY)
Admission: RE | Admit: 2016-09-05 | Discharge: 2016-09-05 | Disposition: A | Discharge status: Home / Self Care | Payer: Medicaid Other | Source: Ambulatory Visit | Attending: Hematology and Oncology | Admitting: Hematology and Oncology

## 2016-09-05 DIAGNOSIS — J438 Other emphysema: Secondary | ICD-10-CM | POA: Insufficient documentation

## 2016-09-05 DIAGNOSIS — R918 Other nonspecific abnormal finding of lung field: Secondary | ICD-10-CM | POA: Insufficient documentation

## 2016-09-05 DIAGNOSIS — M47892 Other spondylosis, cervical region: Secondary | ICD-10-CM | POA: Insufficient documentation

## 2016-09-05 DIAGNOSIS — C099 Malignant neoplasm of tonsil, unspecified: Secondary | ICD-10-CM | POA: Diagnosis present

## 2016-09-06 ENCOUNTER — Other Ambulatory Visit (HOSPITAL_BASED_OUTPATIENT_CLINIC_OR_DEPARTMENT_OTHER): Payer: Medicaid Other

## 2016-09-06 ENCOUNTER — Telehealth: Payer: Self-pay

## 2016-09-06 ENCOUNTER — Ambulatory Visit (HOSPITAL_BASED_OUTPATIENT_CLINIC_OR_DEPARTMENT_OTHER): Payer: Medicaid Other

## 2016-09-06 ENCOUNTER — Encounter: Payer: Self-pay | Admitting: Hematology and Oncology

## 2016-09-06 ENCOUNTER — Ambulatory Visit (HOSPITAL_BASED_OUTPATIENT_CLINIC_OR_DEPARTMENT_OTHER): Payer: Medicaid Other | Admitting: Hematology and Oncology

## 2016-09-06 ENCOUNTER — Ambulatory Visit: Payer: Medicaid Other

## 2016-09-06 ENCOUNTER — Other Ambulatory Visit: Payer: Self-pay | Admitting: Hematology and Oncology

## 2016-09-06 DIAGNOSIS — Z5111 Encounter for antineoplastic chemotherapy: Secondary | ICD-10-CM

## 2016-09-06 DIAGNOSIS — E039 Hypothyroidism, unspecified: Secondary | ICD-10-CM | POA: Diagnosis not present

## 2016-09-06 DIAGNOSIS — C099 Malignant neoplasm of tonsil, unspecified: Secondary | ICD-10-CM | POA: Diagnosis present

## 2016-09-06 DIAGNOSIS — G893 Neoplasm related pain (acute) (chronic): Secondary | ICD-10-CM

## 2016-09-06 DIAGNOSIS — Z5112 Encounter for antineoplastic immunotherapy: Secondary | ICD-10-CM | POA: Diagnosis not present

## 2016-09-06 DIAGNOSIS — R0981 Nasal congestion: Secondary | ICD-10-CM

## 2016-09-06 DIAGNOSIS — N182 Chronic kidney disease, stage 2 (mild): Secondary | ICD-10-CM

## 2016-09-06 LAB — CBC WITH DIFFERENTIAL/PLATELET
BASO%: 0.6 % (ref 0.0–2.0)
BASOS ABS: 0 10*3/uL (ref 0.0–0.1)
EOS ABS: 0.2 10*3/uL (ref 0.0–0.5)
EOS%: 3.4 % (ref 0.0–7.0)
HEMATOCRIT: 41.7 % (ref 38.4–49.9)
HEMOGLOBIN: 13.5 g/dL (ref 13.0–17.1)
LYMPH#: 0.8 10*3/uL — AB (ref 0.9–3.3)
LYMPH%: 15.5 % (ref 14.0–49.0)
MCH: 29.2 pg (ref 27.2–33.4)
MCHC: 32.4 g/dL (ref 32.0–36.0)
MCV: 90 fL (ref 79.3–98.0)
MONO#: 0.5 10*3/uL (ref 0.1–0.9)
MONO%: 8.3 % (ref 0.0–14.0)
NEUT#: 3.9 10*3/uL (ref 1.5–6.5)
NEUT%: 72.2 % (ref 39.0–75.0)
Platelets: 192 10*3/uL (ref 140–400)
RBC: 4.63 10*6/uL (ref 4.20–5.82)
RDW: 14.6 % (ref 11.0–14.6)
WBC: 5.5 10*3/uL (ref 4.0–10.3)

## 2016-09-06 LAB — COMPREHENSIVE METABOLIC PANEL
ALBUMIN: 4.3 g/dL (ref 3.5–5.0)
ALK PHOS: 67 U/L (ref 40–150)
ALT: 16 U/L (ref 0–55)
ANION GAP: 8 meq/L (ref 3–11)
AST: 21 U/L (ref 5–34)
BUN: 14.4 mg/dL (ref 7.0–26.0)
CALCIUM: 9.9 mg/dL (ref 8.4–10.4)
CHLORIDE: 107 meq/L (ref 98–109)
CO2: 28 mEq/L (ref 22–29)
Creatinine: 1.8 mg/dL — ABNORMAL HIGH (ref 0.7–1.3)
EGFR: 50 mL/min/{1.73_m2} — AB (ref >90)
Glucose: 61 mg/dl — ABNORMAL LOW (ref 70–140)
POTASSIUM: 4.5 meq/L (ref 3.5–5.1)
Sodium: 143 mEq/L (ref 136–145)
Total Bilirubin: 0.44 mg/dL (ref 0.20–1.20)
Total Protein: 7.3 g/dL (ref 6.4–8.3)

## 2016-09-06 LAB — TSH: TSH: 4.901 m(IU)/L — ABNORMAL HIGH (ref 0.320–4.118)

## 2016-09-06 MED ORDER — SODIUM CHLORIDE 0.9 % IV SOLN
Freq: Once | INTRAVENOUS | Status: AC
  Administered 2016-09-06: 14:00:00 via INTRAVENOUS

## 2016-09-06 MED ORDER — FLUTICASONE PROPIONATE 50 MCG/ACT NA SUSP: 2.0000 | Freq: Every day | NASAL | 2 refills | Status: DC

## 2016-09-06 MED ORDER — SODIUM CHLORIDE 0.9 % IV SOLN
200.0000 mg | Freq: Once | INTRAVENOUS | Status: AC
  Administered 2016-09-06: 200 mg via INTRAVENOUS
  Filled 2016-09-06: qty 8

## 2016-09-06 MED ORDER — SODIUM CHLORIDE 0.9 % IJ SOLN
10.0000 mL | INTRAMUSCULAR | Status: DC | PRN
  Administered 2016-09-06: 10 mL via INTRAVENOUS
  Filled 2016-09-06: qty 10

## 2016-09-06 MED ORDER — LIDOCAINE-PRILOCAINE 2.5-2.5 % EX CREA: 1.0000 "application " | TOPICAL_CREAM | CUTANEOUS | 6 refills | Status: DC | PRN

## 2016-09-06 MED ORDER — OXYCODONE HCL 15 MG PO TABS: 15.0000 mg | ORAL_TABLET | Freq: Three times a day (TID) | ORAL | 0 refills | Status: DC | PRN

## 2016-09-06 MED ORDER — LEVOTHYROXINE SODIUM 25 MCG PO TABS: 25.0000 ug | ORAL_TABLET | Freq: Every day | ORAL | 1 refills | Status: DC

## 2016-09-06 MED ORDER — SODIUM CHLORIDE 0.9% FLUSH
10.0000 mL | INTRAVENOUS | Status: DC | PRN
  Administered 2016-09-06: 10 mL
  Filled 2016-09-06: qty 10

## 2016-09-06 MED ORDER — HEPARIN SOD (PORK) LOCK FLUSH 100 UNIT/ML IV SOLN
500.0000 [IU] | Freq: Once | INTRAVENOUS | Status: AC | PRN
  Administered 2016-09-06: 500 [IU]
  Filled 2016-09-06: qty 5

## 2016-09-06 MED FILL — LEVOTHYROXINE 25 MCG TABLET: 25 | 30 days supply | Qty: 30 | Fill #0

## 2016-09-06 MED FILL — oxyCODONE HCL 15 MG TABS: 15 | 30 days supply | Qty: 90 | Fill #0

## 2016-09-06 NOTE — Patient Instructions (Signed)
[facility] Discharge Instructions for Patients Receiving Chemotherapy  Today you received the following chemotherapy agents: Keytruda   To help prevent nausea and vomiting after your treatment, we encourage you to take your nausea medication as directed    If you develop nausea and vomiting that is not controlled by your nausea medication, call the clinic.   BELOW ARE SYMPTOMS THAT SHOULD BE REPORTED IMMEDIATELY:  *FEVER GREATER THAN 100.5 F  *CHILLS WITH OR WITHOUT FEVER  NAUSEA AND VOMITING THAT IS NOT CONTROLLED WITH YOUR NAUSEA MEDICATION  *UNUSUAL SHORTNESS OF BREATH  *UNUSUAL BRUISING OR BLEEDING  TENDERNESS IN MOUTH AND THROAT WITH OR WITHOUT PRESENCE OF ULCERS  *URINARY PROBLEMS  *BOWEL PROBLEMS  UNUSUAL RASH Items with * indicate a potential emergency and should be followed up as soon as possible.  Feel free to call the clinic you have any questions or concerns. The clinic phone number is [phone].  Please show the CHEMO ALERT CARD at check-in to the Emergency Department and triage nurse.   

## 2016-09-06 NOTE — Telephone Encounter (Signed)
-----   Message from [name], MD sent at 09/06/2016  2:34 PM EDT ----- Regarding: abnormal TSH His thyroid test is abnormal, due to radiation I recommend starting him on levothyroxine I have prescribed electronically; he need to take daily in the morning 30 mins before breakfast ----- Message ----- From: Interface, Lab In Three Zero One Sent: 09/06/2016  12:23 PM To: [name], MD

## 2016-09-06 NOTE — Assessment & Plan Note (Signed)
His kidney function is stable. As long as his creatinine clearance remain greater than 30, we will continue chemotherapy Monitor only 

## 2016-09-06 NOTE — Telephone Encounter (Signed)
Called with below message. 

## 2016-09-06 NOTE — Assessment & Plan Note (Signed)
The patient has cancer associated pain. His pain is intermittent in nature and I reassured the patient it is not due to cancer progression I recommend we continue the dose of oxycodone to 15 mg every 8 hours as needed for pain 

## 2016-09-06 NOTE — Assessment & Plan Note (Signed)
The patient has complete response on recent imaging study He will continue treatment indefinitely I do not plan to repeat imaging study again until the end of the year, due around November 2018

## 2016-09-06 NOTE — Assessment & Plan Note (Signed)
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

## 2016-09-06 NOTE — Progress Notes (Signed)
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 FNA of right level III lymph node was inconclusive for cancer      07/25/2013 Imaging    PET scan showed locally advanced disease with abnormal lymphadenopathy in the right axilla      08/06/2013 Procedure    CT-guided biopsy of the lymphadenopathy was negative for malignancy      08/15/2013 Surgery    Patient has placement of port and feeding tube      08/19/2013 - 09/10/2013 Chemotherapy    Patient received chemotherapy with cisplatin. The patient only received 2 doses due to uncontrolled nausea and acute renal failure.      08/19/2013 - 10/15/2013 Radiation Therapy    Received Helical IMRT Tomotherapy:  Right Tonstil and bilateral neck / 70 Gy in 35 fractions to gross disease, 63 Gy in 35 fractions to high risk nodal echelons, and 56 Gy in 35 fractions to intermediate risk nodal echelons.      08/27/2013 - 08/30/2013 Hospital Admission    The patient was admitted to the hospital for uncontrolled nausea vomiting and dehydration.      02/14/2014 Imaging    PET/CT scan showed complete response to treatment      03/19/2014 Surgery    He had  excisional lymph node biopsy from the right neck. Pathology was benign      05/13/2014 Surgery    He had removal of Port-A-Cath.      05/22/2014 Imaging    Repeat CT scan of the neck show no evidence of disease recurrence.      12/09/2014 Imaging    Ct neck without contrast showed persistent abnormalities on the right side of neck, indeterminate      12/25/2014 Imaging    PET CT scan showed disease recurrence.      02/10/2015 Procedure    He has placement of port      02/13/2015 - 07/13/2015 Chemotherapy    He received chemotherapy with carbo/Taxol      04/21/2015 Imaging    PET CT scan showed improved disease control      08/04/2015 Imaging    PET scan showed persistent disease      08/17/2015 -  Chemotherapy    He was started with [facility]      10/19/2015 Imaging    Ct neck showed mass-like intermediate density soft tissue at the right lateral neck recurrence site stable.       02/26/2016 Imaging    CT neck showed unchanged appearance of right neck recurrence compared to 10/19/2015 CT. No noncontrast evidence of new metastatic disease in the neck. Chronic sinusitis, progressed.      02/26/2016 Imaging    Diffuse but  patchy and asymmetric partial airspace filling process (ground-glass opacity) in the lungs. This could be due to respiratory bronchiolitis, hypersensitivity pneumonitis or possible drug reaction. Atypical/viral pneumonia is also possible. Pulmonary consultation may be a helpful. A three-month follow-up noncontrast chest CT is suggested. Slight interval enlargement of mediastinal lymph nodes and a small lymph node along the left major fissure. This is most likely due to the inflammatory process in the lungs. No findings for metastatic disease involving the chest. No findings for upper abdominal metastatic disease.      02/29/2016 Adverse Reaction    His treatment is placed on hold due to possible hypersensitivity pneumonitis/drug reaction      04/14/2016 Imaging     Ct chest showed no evidence for metastatic disease within the chest. Significant interval improvement and near complete resolution of previously described diffuse bilateral predominately ground-glass pulmonary opacities, most compatible with resolving infectious/inflammatory process.      09/05/2016 Imaging    CT scan of neck and chest  1. Unchanged appearance of right neck recurrence. 2. No evidence of new metastatic disease in the neck. 3. Unremarkable and stable CT appearance of the chest. No findings suspicious for metastatic disease       INTERVAL HISTORY: Please see below for problem oriented charting. He returns for chemotherapy and to review test results He had recent cold-like sensation alternate with fever, resolved He continues to have severe pain in the neck area from prior surgery His appetite is stable He denies recent invasive drug use He is compliant taking medications as directed Denies recent smoking  REVIEW OF SYSTEMS:   Eyes: Denies blurriness of vision Ears, nose, mouth, throat, and face: Denies mucositis or sore throat Respiratory: Denies cough, dyspnea or wheezes Cardiovascular: Denies palpitation, chest discomfort or lower extremity swelling Gastrointestinal:  Denies nausea, heartburn or change in bowel habits Skin: Denies abnormal skin rashes Lymphatics: Denies new lymphadenopathy or easy bruising Neurological:Denies numbness, tingling or new weaknesses Behavioral/Psych: Mood is stable, no new changes  All other systems were reviewed with the patient and are negative.  I have reviewed the past medical history, past surgical history, social history and family history with the patient and they are unchanged from previous note.  ALLERGIES:  is allergic to pollen extract.  MEDICATIONS:  Current Outpatient Prescriptions  Medication Sig Dispense Refill  . fluticasone (FLONASE) 50 MCG/ACT nasal spray Place 2 sprays into both nostrils daily. 16 g 2  .  levothyroxine (SYNTHROID, LEVOTHROID) 25 MCG tablet Take 1 tablet (25 mcg total) by mouth daily before breakfast. 30 tablet 1  . lidocaine-prilocaine (EMLA) cream Apply 1 application topically as needed. 30 g 6  . Multiple Vitamin (MULTIVITAMIN WITH MINERALS) TABS tablet Take 1 tablet by mouth daily.    Marland Kitchen omeprazole (PRILOSEC) 20 MG capsule Take 1 capsule (20 mg total) by mouth daily. 30 capsule 0  . ondansetron (ZOFRAN ODT) 4 MG disintegrating tablet Take 1 tablet (4 mg total) by mouth every 8 (eight) hours as needed for nausea or vomiting. (Patient not taking: Reported on 07/15/2016) 10 tablet 0  . ondansetron (ZOFRAN) 8 MG tablet Take 1 tablet (8 mg total) by mouth every 8 (eight) hours as needed for nausea or vomiting. (Patient not taking: Reported on 07/15/2016) 90 tablet 1  . oxyCODONE (ROXICODONE) 15 MG immediate release tablet Take 1 tablet (15 mg total) by mouth every 8 (eight) hours as needed. 90 tablet 0  . prochlorperazine (COMPAZINE) 10 MG tablet Take 1 tablet (10 mg total) by  mouth every 6 (six) hours as needed (Nausea or vomiting). (Patient not taking: Reported on 07/15/2016) 60 tablet 1  . traZODone (DESYREL) 150 MG tablet Take 1 tablet (150 mg total) by mouth at bedtime. 30 tablet 11   No current facility-administered medications for this visit.    Facility-Administered Medications Ordered in Other Visits  Medication Dose Route Frequency Provider Last Rate Last Dose  . sodium chloride flush (NS) 0.9 % injection 10 mL  10 mL Intracatheter PRN [name], MD   10 mL at 09/06/16 1456    PHYSICAL EXAMINATION: ECOG PERFORMANCE STATUS: 1 - Symptomatic but completely ambulatory  Vitals:   09/06/16 1301  BP: 135/87  Pulse: 70  Resp: 18  Temp: 98.7 F (37.1 C)   Filed Weights   09/06/16 1301  Weight: 278 lb 12.8 oz (126.5 kg)    GENERAL:alert, no distress and comfortable SKIN: skin color, texture, turgor are normal, no rashes or significant lesions EYES: normal, Conjunctiva  are pink and non-injected, sclera clear OROPHARYNX:no exudate, no erythema and lips, buccal mucosa, and tongue normal  NECK: Well-healed surgical scar LYMPH:  no palpable lymphadenopathy in the cervical, axillary or inguinal LUNGS: clear to auscultation and percussion with normal breathing effort HEART: regular rate & rhythm and no murmurs and no lower extremity edema ABDOMEN:abdomen soft, non-tender and normal bowel sounds Musculoskeletal:no cyanosis of digits and no clubbing  NEURO: alert & oriented x 3 with fluent speech, no focal motor/sensory deficits  LABORATORY DATA:  I have reviewed the data as listed    Component Value Date/Time   NA 143 09/06/2016 1157   K 4.5 09/06/2016 1157   CL 107 07/23/2015 1513   CO2 28 09/06/2016 1157   GLUCOSE 61 (L) 09/06/2016 1157   BUN 14.4 09/06/2016 1157   CREATININE 1.8 (H) 09/06/2016 1157   CALCIUM 9.9 09/06/2016 1157   PROT 7.3 09/06/2016 1157   ALBUMIN 4.3 09/06/2016 1157   AST 21 09/06/2016 1157   ALT 16 09/06/2016 1157   ALKPHOS 67 09/06/2016 1157   BILITOT 0.44 09/06/2016 1157   GFRNONAA 44 (L) 07/23/2015 1513   GFRAA 51 (L) 07/23/2015 1513    No results found for: SPEP, UPEP  Lab Results  Component Value Date   WBC 5.5 09/06/2016   NEUTROABS 3.9 09/06/2016   HGB 13.5 09/06/2016   HCT 41.7 09/06/2016   MCV 90.0 09/06/2016   PLT 192 09/06/2016      Chemistry      Component Value Date/Time   NA 143 09/06/2016 1157   K 4.5 09/06/2016 1157   CL 107 07/23/2015 1513   CO2 28 09/06/2016 1157   BUN 14.4 09/06/2016 1157   CREATININE 1.8 (H) 09/06/2016 1157      Component Value Date/Time   CALCIUM 9.9 09/06/2016 1157   ALKPHOS 67 09/06/2016 1157   AST 21 09/06/2016 1157   ALT 16 09/06/2016 1157   BILITOT 0.44 09/06/2016 1157       RADIOGRAPHIC STUDIES: I have personally reviewed the radiological images as listed and agreed with the findings in the report. Ct Soft Tissue Neck Wo Contrast  Result Date:  09/05/2016 CLINICAL DATA:  Tonsil cancer restaging.  Ongoing chemotherapy. EXAM: CT NECK WITHOUT CONTRAST TECHNIQUE: Multidetector CT imaging of the neck was performed following the standard protocol without intravenous contrast. COMPARISON:  02/26/2016 FINDINGS: Pharynx and larynx: Pharyngeal mucosal contours are unchanged, with volume loss again noted at the level of the right tonsil. Masslike soft tissue in the right  lateral neck contiguous with the treated right tonsil region does not appear significantly changed from the prior study, estimated to measure 3.1 x 2.3 cm at a comparable level to the prior examination (series 6, image 67). This soft tissue again extends superficially to the skin, anteriorly to the posterior margin of the right submandibular gland, and involves the carotid sheath. Salivary glands: No inflammation, mass, or stone. Thyroid: Unremarkable. Lymph nodes: Unchanged 6 mm short axis calcified lymph node in the right neck just inferior to the mass. No enlarged lymph nodes identified elsewhere in the neck. Vascular: Right carotid space involvement by the above-mentioned tumor recurrence. Partially visualized right jugular Port-A-Cath. Limited intracranial: Unremarkable. Visualized orbits: Unremarkable. Mastoids and visualized paranasal sinuses: Mild right maxillary sinus mucosal thickening, improved from prior. Clear mastoid air cells. Skeleton: Mild cervical spondylosis.  No suspicious osseous lesion. Upper chest: Reported on separate dedicated chest CT. Other: None. IMPRESSION: 1. Unchanged appearance of right neck recurrence. 2. No evidence of new metastatic disease in the neck. Electronically Signed   By: Logan Bores M.D.   On: 09/05/2016 11:46   Ct Chest Wo Contrast  Result Date: 09/05/2016 CLINICAL DATA:  Restaging tonsillar cancer. EXAM: CT CHEST WITHOUT CONTRAST TECHNIQUE: Multidetector CT imaging of the chest was performed following the standard protocol without IV contrast.  COMPARISON:  Chest CT 04/15/2016. FINDINGS: Cardiovascular: The heart is normal in size. No pericardial effusion. Normal appearance of the thoracic aorta. No definite coronary artery calcifications. Right subclavian Port-A-Cath in good position without complicating features. Mediastinum/Nodes: No mediastinal or hilar mass or lymphadenopathy. Small scattered lymph nodes are stable. The esophagus is grossly normal. Lungs/Pleura: Apical parenchymal scarring changes are stable. Stable emphysema. No acute pulmonary findings. No worrisome pulmonary nodules to suggest metastatic disease. No bronchiectasis or interstitial lung disease. Upper Abdomen: No significant upper abdominal findings. Chest wall/ Musculoskeletal: No chest wall mass, supraclavicular or axillary lymphadenopathy. The thyroid gland is grossly normal. No significant bony findings. Stable changes of DISH. IMPRESSION: Unremarkable and stable CT appearance of the chest. No findings suspicious for metastatic disease. Electronically Signed   By: Marijo Sanes M.D.   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 His pain is intermittent in nature and I reassured the patient it is not due to cancer progression I recommend we continue the dose of oxycodone to 15 mg every 8 hours as needed for pain  Chronic kidney disease (CKD), stage II (mild) His kidney function is stable. 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  All questions were answered. The patient knows to call the clinic with any problems, questions or concerns. No barriers to learning was detected. I spent 25 minutes counseling the patient face to face. The total time spent in the appointment was 30 minutes and more than 50% was on counseling and review of test results     [name], MD 09/06/2016 4:39 PM

## 2016-09-10 ENCOUNTER — Telehealth: Payer: Self-pay | Admitting: Hematology and Oncology

## 2016-09-10 NOTE — Telephone Encounter (Signed)
Lm advising appt 6/19 @ 10.45am. Also, mailed calendar.

## 2016-10-04 ENCOUNTER — Ambulatory Visit (HOSPITAL_BASED_OUTPATIENT_CLINIC_OR_DEPARTMENT_OTHER): Payer: Medicaid Other

## 2016-10-04 ENCOUNTER — Ambulatory Visit (HOSPITAL_BASED_OUTPATIENT_CLINIC_OR_DEPARTMENT_OTHER): Payer: Medicaid Other | Admitting: Hematology and Oncology

## 2016-10-04 ENCOUNTER — Other Ambulatory Visit (HOSPITAL_BASED_OUTPATIENT_CLINIC_OR_DEPARTMENT_OTHER): Payer: Medicaid Other

## 2016-10-04 ENCOUNTER — Ambulatory Visit: Payer: Medicaid Other

## 2016-10-04 ENCOUNTER — Telehealth: Payer: Self-pay | Admitting: Hematology and Oncology

## 2016-10-04 DIAGNOSIS — C099 Malignant neoplasm of tonsil, unspecified: Secondary | ICD-10-CM

## 2016-10-04 DIAGNOSIS — E039 Hypothyroidism, unspecified: Secondary | ICD-10-CM | POA: Diagnosis not present

## 2016-10-04 DIAGNOSIS — G893 Neoplasm related pain (acute) (chronic): Secondary | ICD-10-CM

## 2016-10-04 DIAGNOSIS — Z5112 Encounter for antineoplastic immunotherapy: Secondary | ICD-10-CM

## 2016-10-04 DIAGNOSIS — N182 Chronic kidney disease, stage 2 (mild): Secondary | ICD-10-CM | POA: Diagnosis not present

## 2016-10-04 DIAGNOSIS — Z5111 Encounter for antineoplastic chemotherapy: Secondary | ICD-10-CM

## 2016-10-04 LAB — CBC WITH DIFFERENTIAL/PLATELET
BASO%: 0.2 % (ref 0.0–2.0)
Basophils Absolute: 0 10*3/uL (ref 0.0–0.1)
EOS ABS: 0.2 10*3/uL (ref 0.0–0.5)
EOS%: 3.3 % (ref 0.0–7.0)
HEMATOCRIT: 40.9 % (ref 38.4–49.9)
HGB: 13.1 g/dL (ref 13.0–17.1)
LYMPH#: 0.9 10*3/uL (ref 0.9–3.3)
LYMPH%: 16.4 % (ref 14.0–49.0)
MCH: 29.2 pg (ref 27.2–33.4)
MCHC: 32 g/dL (ref 32.0–36.0)
MCV: 91.1 fL (ref 79.3–98.0)
MONO#: 0.4 10*3/uL (ref 0.1–0.9)
MONO%: 7.7 % (ref 0.0–14.0)
NEUT#: 3.8 10*3/uL (ref 1.5–6.5)
NEUT%: 72.4 % (ref 39.0–75.0)
NRBC: 0 % (ref 0–0)
Platelets: 182 10*3/uL (ref 140–400)
RBC: 4.49 10*6/uL (ref 4.20–5.82)
RDW: 13.9 % (ref 11.0–14.6)
WBC: 5.2 10*3/uL (ref 4.0–10.3)

## 2016-10-04 LAB — COMPREHENSIVE METABOLIC PANEL
ALT: 15 U/L (ref 0–55)
AST: 20 U/L (ref 5–34)
Albumin: 3.9 g/dL (ref 3.5–5.0)
Alkaline Phosphatase: 58 U/L (ref 40–150)
Anion Gap: 7 mEq/L (ref 3–11)
BILIRUBIN TOTAL: 0.35 mg/dL (ref 0.20–1.20)
BUN: 16.7 mg/dL (ref 7.0–26.0)
CALCIUM: 9.5 mg/dL (ref 8.4–10.4)
CHLORIDE: 108 meq/L (ref 98–109)
CO2: 26 meq/L (ref 22–29)
CREATININE: 1.9 mg/dL — AB (ref 0.7–1.3)
EGFR: 48 mL/min/{1.73_m2} — ABNORMAL LOW (ref >90)
Glucose: 96 mg/dl (ref 70–140)
Potassium: 4.2 mEq/L (ref 3.5–5.1)
Sodium: 141 mEq/L (ref 136–145)
Total Protein: 7 g/dL (ref 6.4–8.3)

## 2016-10-04 LAB — TSH: TSH: 4.201 m(IU)/L — ABNORMAL HIGH (ref 0.320–4.118)

## 2016-10-04 MED ORDER — OXYCODONE HCL 15 MG PO TABS: 15.0000 mg | ORAL_TABLET | Freq: Three times a day (TID) | ORAL | 0 refills | Status: DC | PRN

## 2016-10-04 MED ORDER — SODIUM CHLORIDE 0.9% FLUSH
10.0000 mL | INTRAVENOUS | Status: DC | PRN
  Administered 2016-10-04: 10 mL
  Filled 2016-10-04: qty 10

## 2016-10-04 MED ORDER — HEPARIN SOD (PORK) LOCK FLUSH 100 UNIT/ML IV SOLN
500.0000 [IU] | Freq: Once | INTRAVENOUS | Status: AC | PRN
  Administered 2016-10-04: 500 [IU]
  Filled 2016-10-04: qty 5

## 2016-10-04 MED ORDER — SODIUM CHLORIDE 0.9 % IJ SOLN
10.0000 mL | INTRAMUSCULAR | Status: DC | PRN
  Administered 2016-10-04: 10 mL via INTRAVENOUS
  Filled 2016-10-04: qty 10

## 2016-10-04 MED ORDER — PEMBROLIZUMAB CHEMO INJECTION 100 MG/4ML
200.0000 mg | Freq: Once | INTRAVENOUS | Status: AC
  Administered 2016-10-04: 200 mg via INTRAVENOUS
  Filled 2016-10-04: qty 8

## 2016-10-04 MED ORDER — SODIUM CHLORIDE 0.9 % IV SOLN
Freq: Once | INTRAVENOUS | Status: AC
  Administered 2016-10-04: 14:00:00 via INTRAVENOUS

## 2016-10-04 MED FILL — oxyCODONE HCL 15 MG TABS: 15 | 30 days supply | Qty: 90 | Fill #0

## 2016-10-04 NOTE — Patient Instructions (Signed)
[facility] Discharge Instructions for Patients Receiving Chemotherapy  Today you received the following chemotherapy agents:  Keytruda.  To help prevent nausea and vomiting after your treatment, we encourage you to take your nausea medication as prescribed.   If you develop nausea and vomiting that is not controlled by your nausea medication, call the clinic.   BELOW ARE SYMPTOMS THAT SHOULD BE REPORTED IMMEDIATELY:  *FEVER GREATER THAN 100.5 F  *CHILLS WITH OR WITHOUT FEVER  NAUSEA AND VOMITING THAT IS NOT CONTROLLED WITH YOUR NAUSEA MEDICATION  *UNUSUAL SHORTNESS OF BREATH  *UNUSUAL BRUISING OR BLEEDING  TENDERNESS IN MOUTH AND THROAT WITH OR WITHOUT PRESENCE OF ULCERS  *URINARY PROBLEMS  *BOWEL PROBLEMS  UNUSUAL RASH Items with * indicate a potential emergency and should be followed up as soon as possible.  Feel free to call the clinic you have any questions or concerns. The clinic phone number is [phone].  Please show the CHEMO ALERT CARD at check-in to the Emergency Department and triage nurse.   

## 2016-10-04 NOTE — Telephone Encounter (Signed)
Appointments schedule per 10/04/16 los. Patient was given a copy of the AVS report and appointment schedule, per 10/04/16 los.

## 2016-10-05 ENCOUNTER — Encounter: Payer: Self-pay | Admitting: Hematology and Oncology

## 2016-10-05 NOTE — Assessment & Plan Note (Signed)
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

## 2016-10-05 NOTE — Progress Notes (Signed)
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 FNA of right level III lymph node was inconclusive for cancer      07/25/2013 Imaging    PET scan showed locally advanced disease with abnormal lymphadenopathy in the right axilla      08/06/2013 Procedure    CT-guided biopsy of the lymphadenopathy was negative for malignancy      08/15/2013 Surgery    Patient has placement of port and feeding tube      08/19/2013 - 09/10/2013 Chemotherapy    Patient received chemotherapy with cisplatin. The patient only received 2 doses due to uncontrolled nausea and acute renal failure.      08/19/2013 - 10/15/2013 Radiation Therapy    Received Helical IMRT Tomotherapy:  Right Tonstil and bilateral neck / 70 Gy in 35 fractions to gross disease, 63 Gy in 35 fractions to high risk nodal echelons, and 56 Gy in 35 fractions to intermediate risk nodal echelons.      08/27/2013 - 08/30/2013 Hospital Admission    The patient was admitted to the hospital for uncontrolled nausea vomiting and dehydration.      02/14/2014 Imaging    PET/CT scan showed complete response to treatment      03/19/2014 Surgery    He had  excisional lymph node biopsy from the right neck. Pathology was benign      05/13/2014 Surgery    He had removal of Port-A-Cath.      05/22/2014 Imaging    Repeat CT scan of the neck show no evidence of disease recurrence.      12/09/2014 Imaging    Ct neck without contrast showed persistent abnormalities on the right side of neck, indeterminate      12/25/2014 Imaging    PET CT scan showed disease recurrence.      02/10/2015 Procedure    He has placement of port      02/13/2015 - 07/13/2015 Chemotherapy    He received chemotherapy with carbo/Taxol      04/21/2015 Imaging    PET CT scan showed improved disease control      08/04/2015 Imaging    PET scan showed persistent disease      08/17/2015 -  Chemotherapy    He was started with [facility]      10/19/2015 Imaging    Ct neck showed mass-like intermediate density soft tissue at the right lateral neck recurrence site stable.       02/26/2016 Imaging    CT neck showed unchanged appearance of right neck recurrence compared to 10/19/2015 CT. No noncontrast evidence of new metastatic disease in the neck. Chronic sinusitis, progressed.      02/26/2016 Imaging    Diffuse but  patchy and asymmetric partial airspace filling process (ground-glass opacity) in the lungs. This could be due to respiratory bronchiolitis, hypersensitivity pneumonitis or possible drug reaction. Atypical/viral pneumonia is also possible. Pulmonary consultation may be a helpful. A three-month follow-up noncontrast chest CT is suggested. Slight interval enlargement of mediastinal lymph nodes and a small lymph node along the left major fissure. This is most likely due to the inflammatory process in the lungs. No findings for metastatic disease involving the chest. No findings for upper abdominal metastatic disease.      02/29/2016 Adverse Reaction    His treatment is placed on hold due to possible hypersensitivity pneumonitis/drug reaction      04/14/2016 Imaging     Ct chest showed no evidence for metastatic disease within the chest. Significant interval improvement and near complete resolution of previously described diffuse bilateral predominately ground-glass pulmonary opacities, most compatible with resolving infectious/inflammatory process.      09/05/2016 Imaging    CT scan of neck and chest  1. Unchanged appearance of right neck recurrence. 2. No evidence of new metastatic disease in the neck. 3. Unremarkable and stable CT appearance of the chest. No findings suspicious for metastatic disease       INTERVAL HISTORY: Please see below for problem oriented charting. He returns for further follow-up His symptoms are about the same He complained of intermittent neck pain and is taking oxycodone as prescribed Denies nausea or constipation Denies recent infection, chest pain, cough shortness of breath He has not been taking levothyroxine as prescribed but he is willing to try   REVIEW OF SYSTEMS:   Constitutional: Denies fevers, chills or abnormal weight loss Eyes: Denies blurriness of vision Ears, nose, mouth, throat, and face: Denies mucositis or sore throat Respiratory: Denies cough, dyspnea or wheezes Cardiovascular: Denies palpitation, chest discomfort or lower extremity swelling Gastrointestinal:  Denies nausea, heartburn or change in bowel habits Skin: Denies abnormal skin rashes Lymphatics: Denies new lymphadenopathy or easy bruising Neurological:Denies numbness, tingling or new weaknesses Behavioral/Psych: Mood is stable, no new changes  All other systems were reviewed with the patient and are negative.  I have reviewed the past medical history, past surgical history, social history and family history with the patient and they are unchanged from previous note.  ALLERGIES:  is allergic to pollen extract.  MEDICATIONS:  Current Outpatient Prescriptions  Medication Sig Dispense Refill  . fluticasone (FLONASE) 50 MCG/ACT nasal  spray Place 2 sprays into both nostrils daily. 16 g 2  . levothyroxine (SYNTHROID, LEVOTHROID) 25 MCG tablet Take 1 tablet (25 mcg total) by mouth daily before breakfast. 30 tablet 1  . lidocaine-prilocaine (EMLA) cream Apply 1 application topically as needed. 30 g 6  . Multiple Vitamin (MULTIVITAMIN WITH MINERALS) TABS tablet Take 1 tablet by mouth daily.    Marland Kitchen omeprazole (PRILOSEC) 20 MG capsule Take 1 capsule (20 mg total) by mouth daily. 30 capsule 0  . oxyCODONE (ROXICODONE) 15 MG immediate release tablet Take 1 tablet (15 mg total) by mouth every 8 (eight) hours as needed. 90 tablet 0  . traZODone (DESYREL) 150 MG tablet Take 1 tablet (150 mg total) by mouth at bedtime. 30 tablet 11  . ondansetron (ZOFRAN ODT) 4 MG disintegrating tablet Take 1 tablet (4 mg total) by mouth every 8 (eight) hours as needed for nausea or vomiting. (Patient not taking: Reported on 07/15/2016) 10 tablet 0  . ondansetron (ZOFRAN) 8 MG tablet Take 1 tablet (8 mg total) by mouth every 8 (eight) hours as  needed for nausea or vomiting. (Patient not taking: Reported on 07/15/2016) 90 tablet 1  . prochlorperazine (COMPAZINE) 10 MG tablet Take 1 tablet (10 mg total) by mouth every 6 (six) hours as needed (Nausea or vomiting). (Patient not taking: Reported on 07/15/2016) 60 tablet 1   No current facility-administered medications for this visit.     PHYSICAL EXAMINATION: ECOG PERFORMANCE STATUS: 1 - Symptomatic but completely ambulatory  Vitals:   10/04/16 1206  BP: 112/84  Pulse: 82  Resp: 18  Temp: 98.7 F (37.1 C)   Filed Weights   10/04/16 1206  Weight: 281 lb 12.8 oz (127.8 kg)    GENERAL:alert, no distress and comfortable SKIN: skin color, texture, turgor are normal, no rashes or significant lesions EYES: normal, Conjunctiva are pink and non-injected, sclera clear OROPHARYNX:no exudate, no erythema and lips, buccal mucosa, and tongue normal  NECK: Well-healed surgical scar. LYMPH:  no palpable  lymphadenopathy in the cervical, axillary or inguinal LUNGS: clear to auscultation and percussion with normal breathing effort HEART: regular rate & rhythm and no murmurs and no lower extremity edema ABDOMEN:abdomen soft, non-tender and normal bowel sounds Musculoskeletal:no cyanosis of digits and no clubbing  NEURO: alert & oriented x 3 with fluent speech, no focal motor/sensory deficits  LABORATORY DATA:  I have reviewed the data as listed    Component Value Date/Time   NA 141 10/04/2016 1140   K 4.2 10/04/2016 1140   CL 107 07/23/2015 1513   CO2 26 10/04/2016 1140   GLUCOSE 96 10/04/2016 1140   BUN 16.7 10/04/2016 1140   CREATININE 1.9 (H) 10/04/2016 1140   CALCIUM 9.5 10/04/2016 1140   PROT 7.0 10/04/2016 1140   ALBUMIN 3.9 10/04/2016 1140   AST 20 10/04/2016 1140   ALT 15 10/04/2016 1140   ALKPHOS 58 10/04/2016 1140   BILITOT 0.35 10/04/2016 1140   GFRNONAA 44 (L) 07/23/2015 1513   GFRAA 51 (L) 07/23/2015 1513    No results found for: SPEP, UPEP  Lab Results  Component Value Date   WBC 5.2 10/04/2016   NEUTROABS 3.8 10/04/2016   HGB 13.1 10/04/2016   HCT 40.9 10/04/2016   MCV 91.1 10/04/2016   PLT 182 10/04/2016      Chemistry      Component Value Date/Time   NA 141 10/04/2016 1140   K 4.2 10/04/2016 1140   CL 107 07/23/2015 1513   CO2 26 10/04/2016 1140   BUN 16.7 10/04/2016 1140   CREATININE 1.9 (H) 10/04/2016 1140      Component Value Date/Time   CALCIUM 9.5 10/04/2016 1140   ALKPHOS 58 10/04/2016 1140   AST 20 10/04/2016 1140   ALT 15 10/04/2016 1140   BILITOT 0.35 10/04/2016 1140      ASSESSMENT & PLAN:  Tonsillar cancer The patient has complete response on recent imaging study He will continue treatment indefinitely I do not plan to repeat imaging study again until the end of the year, due around November 2018  Cancer associated pain The patient has cancer associated pain. 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 As long as his creatinine clearance remain greater than 30, we will continue chemotherapy Monitor only   No orders of the defined types were placed in this encounter.  All questions were answered. The patient knows to call the clinic with any problems, questions or concerns. No barriers to learning was detected. I spent 15 minutes counseling the patient face to face. The total time spent in the appointment was 20 minutes and more than 50% was on counseling and review of test results     [name], MD 10/05/2016 4:43 PM

## 2016-10-05 NOTE — Assessment & Plan Note (Signed)
His kidney function is stable. As long as his creatinine clearance remain greater than 30, we will continue chemotherapy Monitor only 

## 2016-10-05 NOTE — Assessment & Plan Note (Signed)
The patient has cancer associated pain. His pain is intermittent in nature and I reassured the patient it is not due to cancer progression I recommend we continue the dose of oxycodone to 15 mg every 8 hours as needed for pain 

## 2016-10-05 NOTE — Assessment & Plan Note (Signed)
The patient has complete response on recent imaging study He will continue treatment indefinitely I do not plan to repeat imaging study again until the end of the year, due around November 2018

## 2016-11-01 ENCOUNTER — Telehealth: Payer: Self-pay | Admitting: Hematology and Oncology

## 2016-11-01 ENCOUNTER — Ambulatory Visit (HOSPITAL_BASED_OUTPATIENT_CLINIC_OR_DEPARTMENT_OTHER): Payer: Medicaid Other | Admitting: Hematology and Oncology

## 2016-11-01 ENCOUNTER — Other Ambulatory Visit (HOSPITAL_BASED_OUTPATIENT_CLINIC_OR_DEPARTMENT_OTHER): Payer: Medicaid Other

## 2016-11-01 ENCOUNTER — Ambulatory Visit: Payer: Medicaid Other

## 2016-11-01 ENCOUNTER — Ambulatory Visit (HOSPITAL_BASED_OUTPATIENT_CLINIC_OR_DEPARTMENT_OTHER): Payer: Medicaid Other

## 2016-11-01 ENCOUNTER — Encounter: Payer: Self-pay | Admitting: Hematology and Oncology

## 2016-11-01 DIAGNOSIS — C099 Malignant neoplasm of tonsil, unspecified: Secondary | ICD-10-CM | POA: Diagnosis present

## 2016-11-01 DIAGNOSIS — G893 Neoplasm related pain (acute) (chronic): Secondary | ICD-10-CM

## 2016-11-01 DIAGNOSIS — Z5111 Encounter for antineoplastic chemotherapy: Secondary | ICD-10-CM

## 2016-11-01 DIAGNOSIS — C090 Malignant neoplasm of tonsillar fossa: Secondary | ICD-10-CM | POA: Diagnosis present

## 2016-11-01 DIAGNOSIS — K089 Disorder of teeth and supporting structures, unspecified: Secondary | ICD-10-CM

## 2016-11-01 DIAGNOSIS — N182 Chronic kidney disease, stage 2 (mild): Secondary | ICD-10-CM

## 2016-11-01 DIAGNOSIS — Z5112 Encounter for antineoplastic immunotherapy: Secondary | ICD-10-CM

## 2016-11-01 DIAGNOSIS — Z79899 Other long term (current) drug therapy: Secondary | ICD-10-CM

## 2016-11-01 LAB — CBC WITH DIFFERENTIAL/PLATELET
BASO%: 0.2 % (ref 0.0–2.0)
BASOS ABS: 0 10*3/uL (ref 0.0–0.1)
EOS ABS: 0.1 10*3/uL (ref 0.0–0.5)
EOS%: 2.4 % (ref 0.0–7.0)
HEMATOCRIT: 39.7 % (ref 38.4–49.9)
HEMOGLOBIN: 12.8 g/dL — AB (ref 13.0–17.1)
LYMPH%: 12.9 % — ABNORMAL LOW (ref 14.0–49.0)
MCH: 29.6 pg (ref 27.2–33.4)
MCHC: 32.2 g/dL (ref 32.0–36.0)
MCV: 91.9 fL (ref 79.3–98.0)
MONO#: 0.5 10*3/uL (ref 0.1–0.9)
MONO%: 9 % (ref 0.0–14.0)
NEUT#: 4.1 10*3/uL (ref 1.5–6.5)
NEUT%: 75.5 % — AB (ref 39.0–75.0)
Platelets: 180 10*3/uL (ref 140–400)
RBC: 4.32 10*6/uL (ref 4.20–5.82)
RDW: 14.1 % (ref 11.0–14.6)
WBC: 5.4 10*3/uL (ref 4.0–10.3)
lymph#: 0.7 10*3/uL — ABNORMAL LOW (ref 0.9–3.3)

## 2016-11-01 LAB — TSH: TSH: 3.344 m[IU]/L (ref 0.320–4.118)

## 2016-11-01 LAB — COMPREHENSIVE METABOLIC PANEL
ALK PHOS: 54 U/L (ref 40–150)
ALT: 20 U/L (ref 0–55)
AST: 27 U/L (ref 5–34)
Albumin: 3.9 g/dL (ref 3.5–5.0)
Anion Gap: 14 mEq/L — ABNORMAL HIGH (ref 3–11)
BUN: 16.8 mg/dL (ref 7.0–26.0)
CALCIUM: 9.2 mg/dL (ref 8.4–10.4)
CHLORIDE: 107 meq/L (ref 98–109)
CO2: 22 mEq/L (ref 22–29)
Creatinine: 1.9 mg/dL — ABNORMAL HIGH (ref 0.7–1.3)
EGFR: 48 mL/min/{1.73_m2} — AB (ref >90)
Glucose: 84 mg/dl (ref 70–140)
POTASSIUM: 4.4 meq/L (ref 3.5–5.1)
Sodium: 143 mEq/L (ref 136–145)
Total Bilirubin: 0.55 mg/dL (ref 0.20–1.20)
Total Protein: 6.9 g/dL (ref 6.4–8.3)

## 2016-11-01 MED ORDER — HEPARIN SOD (PORK) LOCK FLUSH 100 UNIT/ML IV SOLN
500.0000 [IU] | Freq: Once | INTRAVENOUS | Status: AC | PRN
Start: 2016-11-01 — End: 2016-11-01
  Administered 2016-11-01: 500 [IU]
  Filled 2016-11-01: qty 5

## 2016-11-01 MED ORDER — LEVOTHYROXINE SODIUM 25 MCG PO TABS
25.0000 ug | ORAL_TABLET | Freq: Every day | ORAL | 1 refills | Status: DC
Start: 2016-11-01 — End: 2016-11-29

## 2016-11-01 MED ORDER — SODIUM CHLORIDE 0.9 % IV SOLN
Freq: Once | INTRAVENOUS | Status: AC
  Administered 2016-11-01: 09:00:00 via INTRAVENOUS

## 2016-11-01 MED ORDER — SODIUM CHLORIDE 0.9 % IJ SOLN
10.0000 mL | INTRAMUSCULAR | Status: DC | PRN
  Administered 2016-11-01: 10 mL via INTRAVENOUS
  Filled 2016-11-01: qty 10

## 2016-11-01 MED ORDER — PEMBROLIZUMAB CHEMO INJECTION 100 MG/4ML
200.0000 mg | Freq: Once | INTRAVENOUS | Status: AC
  Administered 2016-11-01: 200 mg via INTRAVENOUS
  Filled 2016-11-01: qty 8

## 2016-11-01 MED ORDER — OXYCODONE HCL 15 MG PO TABS: 15.0000 mg | ORAL_TABLET | Freq: Three times a day (TID) | ORAL | 0 refills | Status: DC | PRN

## 2016-11-01 MED ORDER — SODIUM CHLORIDE 0.9% FLUSH
10.0000 mL | INTRAVENOUS | Status: DC | PRN
  Administered 2016-11-01: 10 mL
  Filled 2016-11-01: qty 10

## 2016-11-01 MED FILL — oxyCODONE HCL 15 MG TABS: 15 | 30 days supply | Qty: 90 | Fill #0

## 2016-11-01 MED FILL — LEVOTHYROXINE 25 MCG TABLET: 25 | 30 days supply | Qty: 30 | Fill #0

## 2016-11-01 NOTE — Patient Instructions (Signed)
[facility] Discharge Instructions for Patients Receiving Chemotherapy  Today you received the following chemotherapy agents:  Keytruda.  To help prevent nausea and vomiting after your treatment, we encourage you to take your nausea medication as prescribed.   If you develop nausea and vomiting that is not controlled by your nausea medication, call the clinic.   BELOW ARE SYMPTOMS THAT SHOULD BE REPORTED IMMEDIATELY:  *FEVER GREATER THAN 100.5 F  *CHILLS WITH OR WITHOUT FEVER  NAUSEA AND VOMITING THAT IS NOT CONTROLLED WITH YOUR NAUSEA MEDICATION  *UNUSUAL SHORTNESS OF BREATH  *UNUSUAL BRUISING OR BLEEDING  TENDERNESS IN MOUTH AND THROAT WITH OR WITHOUT PRESENCE OF ULCERS  *URINARY PROBLEMS  *BOWEL PROBLEMS  UNUSUAL RASH Items with * indicate a potential emergency and should be followed up as soon as possible.  Feel free to call the clinic you have any questions or concerns. The clinic phone number is [phone].  Please show the CHEMO ALERT CARD at check-in to the Emergency Department and triage nurse.   

## 2016-11-01 NOTE — Patient Instructions (Signed)
Implanted Port Home Guide An implanted port is a type of central line that is placed under the skin. Central lines are used to provide IV access when treatment or nutrition needs to be given through a person's veins. Implanted ports are used for long-term IV access. An implanted port may be placed because:  You need IV medicine that would be irritating to the small veins in your hands or arms.  You need long-term IV medicines, such as antibiotics.  You need IV nutrition for a long period.  You need frequent blood draws for lab tests.  You need dialysis.  Implanted ports are usually placed in the chest area, but they can also be placed in the upper arm, the abdomen, or the leg. An implanted port has two main parts:  Reservoir. The reservoir is round and will appear as a small, raised area under your skin. The reservoir is the part where a needle is inserted to give medicines or draw blood.  Catheter. The catheter is a thin, flexible tube that extends from the reservoir. The catheter is placed into a large vein. Medicine that is inserted into the reservoir goes into the catheter and then into the vein.  How will I care for my incision site? Do not get the incision site wet. Bathe or shower as directed by your health care provider. How is my port accessed? Special steps must be taken to access the port:  Before the port is accessed, a numbing cream can be placed on the skin. This helps numb the skin over the port site.  Your health care provider uses a sterile technique to access the port. ? Your health care provider must put on a mask and sterile gloves. ? The skin over your port is cleaned carefully with an antiseptic and allowed to dry. ? The port is gently pinched between sterile gloves, and a needle is inserted into the port.  Only "non-coring" port needles should be used to access the port. Once the port is accessed, a blood return should be checked. This helps ensure that the port  is in the vein and is not clogged.  If your port needs to remain accessed for a constant infusion, a clear (transparent) bandage will be placed over the needle site. The bandage and needle will need to be changed every week, or as directed by your health care provider.  Keep the bandage covering the needle clean and dry. Do not get it wet. Follow your health care provider's instructions on how to take a shower or bath while the port is accessed.  If your port does not need to stay accessed, no bandage is needed over the port.  What is flushing? Flushing helps keep the port from getting clogged. Follow your health care provider's instructions on how and when to flush the port. Ports are usually flushed with saline solution or a medicine called heparin. The need for flushing will depend on how the port is used.  If the port is used for intermittent medicines or blood draws, the port will need to be flushed: ? After medicines have been given. ? After blood has been drawn. ? As part of routine maintenance.  If a constant infusion is running, the port may not need to be flushed.  How long will my port stay implanted? The port can stay in for as long as your health care provider thinks it is needed. When it is time for the port to come out, surgery will be   done to remove it. The procedure is similar to the one performed when the port was put in. When should I seek immediate medical care? When you have an implanted port, you should seek immediate medical care if:  You notice a bad smell coming from the incision site.  You have swelling, redness, or drainage at the incision site.  You have more swelling or pain at the port site or the surrounding area.  You have a fever that is not controlled with medicine.  This information is not intended to replace advice given to you by your health care provider. Make sure you discuss any questions you have with your health care provider. Document  Released: 04/04/2005 Document Revised: 09/10/2015 Document Reviewed: 12/10/2012 Elsevier Interactive Patient Education  2017 [employer]  

## 2016-11-01 NOTE — Telephone Encounter (Signed)
Gave patient avs report and appointments for August.  °

## 2016-11-01 NOTE — Progress Notes (Signed)
Dr. Alvy Bimler okay to tx with ctn < 2.5 per order. Ctn 1.9.

## 2016-11-02 NOTE — Progress Notes (Signed)
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 FNA of right level III lymph node was inconclusive for cancer      07/25/2013 Imaging    PET scan showed locally advanced disease with abnormal lymphadenopathy in the right axilla      08/06/2013 Procedure    CT-guided biopsy of the lymphadenopathy was negative for malignancy      08/15/2013 Surgery    Patient has placement of port and feeding tube      08/19/2013 - 09/10/2013 Chemotherapy    Patient received chemotherapy with cisplatin. The patient only received 2 doses due to uncontrolled nausea and acute renal failure.      08/19/2013 - 10/15/2013 Radiation Therapy    Received Helical IMRT Tomotherapy:  Right Tonstil and bilateral neck / 70 Gy in 35 fractions to gross disease, 63 Gy in 35 fractions to high risk nodal echelons, and 56 Gy in 35 fractions to intermediate risk nodal echelons.      08/27/2013 - 08/30/2013 Hospital Admission    The patient was admitted to the hospital for uncontrolled nausea vomiting and dehydration.      02/14/2014 Imaging    PET/CT scan showed complete response to treatment      03/19/2014 Surgery    He had  excisional lymph node biopsy from the right neck. Pathology was benign      05/13/2014 Surgery    He had removal of Port-A-Cath.      05/22/2014 Imaging    Repeat CT scan of the neck show no evidence of disease recurrence.      12/09/2014 Imaging    Ct neck without contrast showed persistent abnormalities on the right side of neck, indeterminate      12/25/2014 Imaging    PET CT scan showed disease recurrence.      02/10/2015 Procedure    He has placement of port      02/13/2015 - 07/13/2015 Chemotherapy    He received chemotherapy with carbo/Taxol      04/21/2015 Imaging    PET CT scan showed improved disease control      08/04/2015 Imaging    PET scan showed persistent disease      08/17/2015 -  Chemotherapy    He was started with [facility]      10/19/2015 Imaging    Ct neck showed mass-like intermediate density soft tissue at the right lateral neck recurrence site stable.       02/26/2016 Imaging    CT neck showed unchanged appearance of right neck recurrence compared to 10/19/2015 CT. No noncontrast evidence of new metastatic disease in the neck. Chronic sinusitis, progressed.      02/26/2016 Imaging    Diffuse but  patchy and asymmetric partial airspace filling process (ground-glass opacity) in the lungs. This could be due to respiratory bronchiolitis, hypersensitivity pneumonitis or possible drug reaction. Atypical/viral pneumonia is also possible. Pulmonary consultation may be a helpful. A three-month follow-up noncontrast chest CT is suggested. Slight interval enlargement of mediastinal lymph nodes and a small lymph node along the left major fissure. This is most likely due to the inflammatory process in the lungs. No findings for metastatic disease involving the chest. No findings for upper abdominal metastatic disease.      02/29/2016 Adverse Reaction    His treatment is placed on hold due to possible hypersensitivity pneumonitis/drug reaction      04/14/2016 Imaging     Ct chest showed no evidence for metastatic disease within the chest. Significant interval improvement and near complete resolution of previously described diffuse bilateral predominately ground-glass pulmonary opacities, most compatible with resolving infectious/inflammatory process.      09/05/2016 Imaging    CT scan of neck and chest  1. Unchanged appearance of right neck recurrence. 2. No evidence of new metastatic disease in the neck. 3. Unremarkable and stable CT appearance of the chest. No findings suspicious for metastatic disease       INTERVAL HISTORY: Please see below for problem oriented charting. He returns for further chemotherapy He is concerned about poor dentition He denies dental pain His neck pain is about the same He continues to take oxycodone on a regular basis Denies recent infection, fever or cough.  No new lymphadenopathy.  REVIEW OF SYSTEMS:   Constitutional: Denies fevers, chills or abnormal weight loss Eyes: Denies blurriness of vision Ears, nose, mouth, throat, and face: Denies mucositis or sore throat Respiratory: Denies cough, dyspnea or wheezes Cardiovascular: Denies palpitation, chest discomfort or lower extremity swelling Gastrointestinal:  Denies nausea, heartburn or change in bowel habits Skin: Denies abnormal skin rashes Lymphatics: Denies new lymphadenopathy or easy bruising Neurological:Denies numbness, tingling or new weaknesses Behavioral/Psych: Mood is stable, no new changes  All other systems were reviewed with the patient and are negative.  I have reviewed the past medical history, past surgical history, social history and family history with the patient and they are unchanged from previous note.  ALLERGIES:  is allergic to pollen extract.  MEDICATIONS:  Current Outpatient Prescriptions  Medication Sig Dispense Refill  . fluticasone (FLONASE) 50 MCG/ACT nasal spray Place 2 sprays into both nostrils daily. 16 g 2  . levothyroxine  (SYNTHROID, LEVOTHROID) 25 MCG tablet Take 1 tablet (25 mcg total) by mouth daily before breakfast. 30 tablet 1  . lidocaine-prilocaine (EMLA) cream Apply 1 application topically as needed. 30 g 6  . Multiple Vitamin (MULTIVITAMIN WITH MINERALS) TABS tablet Take 1 tablet by mouth daily.    Marland Kitchen omeprazole (PRILOSEC) 20 MG capsule Take 1 capsule (20 mg total) by mouth daily. 30 capsule 0  . oxyCODONE (ROXICODONE) 15 MG immediate release tablet Take 1 tablet (15 mg total) by mouth every 8 (eight) hours as needed. 90 tablet 0  . traZODone (DESYREL) 150 MG tablet Take 1 tablet (150 mg total) by mouth at bedtime. 30 tablet 11   No current facility-administered medications for this visit.     PHYSICAL EXAMINATION: ECOG PERFORMANCE STATUS: 1 - Symptomatic but completely ambulatory  Vitals:   11/01/16 0845  BP: (!) 150/84  Pulse: 83  Resp: 18  Temp: 98.5 F (36.9 C)   Filed Weights   11/01/16 0845  Weight: 286 lb 3.2 oz (129.8 kg)    GENERAL:alert,  no distress and comfortable SKIN: skin color, texture, turgor are normal, no rashes or significant lesions EYES: normal, Conjunctiva are pink and non-injected, sclera clear OROPHARYNX:no exudate, no erythema and lips, buccal mucosa, and tongue normal.  Poor dentition is noted NECK: Noted well-healed surgical scar.   LYMPH:  no palpable lymphadenopathy in the cervical, axillary or inguinal LUNGS: clear to auscultation and percussion with normal breathing effort HEART: regular rate & rhythm and no murmurs and no lower extremity edema ABDOMEN:abdomen soft, non-tender and normal bowel sounds Musculoskeletal:no cyanosis of digits and no clubbing  NEURO: alert & oriented x 3 with fluent speech, no focal motor/sensory deficits  LABORATORY DATA:  I have reviewed the data as listed    Component Value Date/Time   NA 143 11/01/2016 0813   K 4.4 11/01/2016 0813   CL 107 07/23/2015 1513   CO2 22 11/01/2016 0813   GLUCOSE 84 11/01/2016 0813   BUN  16.8 11/01/2016 0813   CREATININE 1.9 (H) 11/01/2016 0813   CALCIUM 9.2 11/01/2016 0813   PROT 6.9 11/01/2016 0813   ALBUMIN 3.9 11/01/2016 0813   AST 27 11/01/2016 0813   ALT 20 11/01/2016 0813   ALKPHOS 54 11/01/2016 0813   BILITOT 0.55 11/01/2016 0813   GFRNONAA 44 (L) 07/23/2015 1513   GFRAA 51 (L) 07/23/2015 1513    No results found for: SPEP, UPEP  Lab Results  Component Value Date   WBC 5.4 11/01/2016   NEUTROABS 4.1 11/01/2016   HGB 12.8 (L) 11/01/2016   HCT 39.7 11/01/2016   MCV 91.9 11/01/2016   PLT 180 11/01/2016      Chemistry      Component Value Date/Time   NA 143 11/01/2016 0813   K 4.4 11/01/2016 0813   CL 107 07/23/2015 1513   CO2 22 11/01/2016 0813   BUN 16.8 11/01/2016 0813   CREATININE 1.9 (H) 11/01/2016 0813      Component Value Date/Time   CALCIUM 9.2 11/01/2016 0813   ALKPHOS 54 11/01/2016 0813   AST 27 11/01/2016 0813   ALT 20 11/01/2016 0813   BILITOT 0.55 11/01/2016 0813       ASSESSMENT & PLAN:  Tonsillar cancer The patient has complete response on recent imaging study He will continue treatment indefinitely I do not plan to repeat imaging study again until the end of the year, due around November 2018  Cancer associated pain The patient has cancer associated pain. His pain is intermittent in nature and I reassured the patient it is not due to cancer progression I recommend we continue the dose of oxycodone to 15 mg every 8 hours as needed for pain  Chronic kidney disease (CKD), stage II (mild) His kidney function is stable. As long as his creatinine clearance remain greater than 30, we will continue chemotherapy Monitor only  Poor dentition He has  poor dental hygiene and have poor dentition I recommend further consultation with oral surgeon.  I will get my head and neck navigator to coordinate appointment.   No orders of the defined types were placed in this encounter.  All questions were answered. The patient knows to  call the clinic with any problems, questions or concerns. No barriers to learning was detected. I spent 15 minutes counseling the patient face to face. The total time spent in the appointment was 20 minutes and more than 50% was on counseling and review of test results     [name], MD 11/02/2016 6:34 PM

## 2016-11-02 NOTE — Assessment & Plan Note (Signed)
The patient has complete response on recent imaging study He will continue treatment indefinitely I do not plan to repeat imaging study again until the end of the year, due around November 2018

## 2016-11-02 NOTE — Assessment & Plan Note (Signed)
He has  poor dental hygiene and have poor dentition I recommend further consultation with oral surgeon.  I will get my head and neck navigator to coordinate appointment.

## 2016-11-02 NOTE — Assessment & Plan Note (Signed)
The patient has cancer associated pain. His pain is intermittent in nature and I reassured the patient it is not due to cancer progression I recommend we continue the dose of oxycodone to 15 mg every 8 hours as needed for pain 

## 2016-11-02 NOTE — Assessment & Plan Note (Signed)
His kidney function is stable. As long as his creatinine clearance remain greater than 30, we will continue chemotherapy Monitor only 

## 2016-11-17 ENCOUNTER — Telehealth: Payer: Self-pay | Admitting: *Deleted

## 2016-11-17 NOTE — Telephone Encounter (Signed)
Oncology Nurse Navigator Documentation  Called Mr. Mason [name] in follow-up to his 11/01/16 appt with Dr. Alvy Bimler to confirm his agreement to dental follow-up to address identified dental issues. I noted:  He had dental surgery with Dr. Enrique Sack, [facility], 08/01/13, including extractions.  Last follow-up with Dr. Enrique Sack was 11/14/13 at which time he recommended arranging to see new primary Dentist in two months for an exam, cleaning and evaluation for other treatment needs with suggestion to call [facility] on [facility] as possible dental office as they take Medicaid.  He stated he did not follow-up as guided, has not seen a dentist since his last visit with Dr. Enrique Sack. I indicated I would contact Dr. Ritta Slot office for guidance, call back with recommendation.  Gayleen Orem, RN, BSN, [place] [facility] Oncology Nurse [place] at [place] [phone]

## 2016-11-18 ENCOUNTER — Telehealth: Payer: Self-pay | Admitting: *Deleted

## 2016-11-18 NOTE — Telephone Encounter (Signed)
Oncology Nurse Navigator Documentation  Camie Patience, Dental Medicine, informed she had called Mr. Gaspard with guidance to establish with community dentist for evaluation.   She referred him to [facility] [address] [place], [phone], a practice that accepts Medicaid patients.  Gayleen Orem, RN, BSN, [place] [facility] Oncology Nurse [place] at [place] [phone]

## 2016-11-29 ENCOUNTER — Ambulatory Visit (HOSPITAL_BASED_OUTPATIENT_CLINIC_OR_DEPARTMENT_OTHER): Payer: Medicaid Other

## 2016-11-29 ENCOUNTER — Ambulatory Visit (HOSPITAL_BASED_OUTPATIENT_CLINIC_OR_DEPARTMENT_OTHER): Payer: Medicaid Other | Admitting: Hematology and Oncology

## 2016-11-29 ENCOUNTER — Other Ambulatory Visit (HOSPITAL_BASED_OUTPATIENT_CLINIC_OR_DEPARTMENT_OTHER): Payer: Medicaid Other

## 2016-11-29 ENCOUNTER — Encounter: Payer: Self-pay | Admitting: Hematology and Oncology

## 2016-11-29 ENCOUNTER — Ambulatory Visit: Payer: Medicaid Other

## 2016-11-29 ENCOUNTER — Telehealth: Payer: Self-pay | Admitting: Hematology and Oncology

## 2016-11-29 DIAGNOSIS — C099 Malignant neoplasm of tonsil, unspecified: Secondary | ICD-10-CM

## 2016-11-29 DIAGNOSIS — Z79899 Other long term (current) drug therapy: Secondary | ICD-10-CM | POA: Diagnosis not present

## 2016-11-29 DIAGNOSIS — G893 Neoplasm related pain (acute) (chronic): Secondary | ICD-10-CM | POA: Diagnosis not present

## 2016-11-29 DIAGNOSIS — K089 Disorder of teeth and supporting structures, unspecified: Secondary | ICD-10-CM | POA: Diagnosis not present

## 2016-11-29 DIAGNOSIS — Z5111 Encounter for antineoplastic chemotherapy: Secondary | ICD-10-CM

## 2016-11-29 DIAGNOSIS — E039 Hypothyroidism, unspecified: Secondary | ICD-10-CM

## 2016-11-29 DIAGNOSIS — N182 Chronic kidney disease, stage 2 (mild): Secondary | ICD-10-CM

## 2016-11-29 DIAGNOSIS — Z5112 Encounter for antineoplastic immunotherapy: Secondary | ICD-10-CM

## 2016-11-29 LAB — COMPREHENSIVE METABOLIC PANEL
ALBUMIN: 3.7 g/dL (ref 3.5–5.0)
ALK PHOS: 59 U/L (ref 40–150)
ALT: 19 U/L (ref 0–55)
ANION GAP: 8 meq/L (ref 3–11)
AST: 22 U/L (ref 5–34)
BUN: 13.4 mg/dL (ref 7.0–26.0)
CALCIUM: 9.3 mg/dL (ref 8.4–10.4)
CHLORIDE: 107 meq/L (ref 98–109)
CO2: 26 mEq/L (ref 22–29)
Creatinine: 1.7 mg/dL — ABNORMAL HIGH (ref 0.7–1.3)
EGFR: 54 mL/min/{1.73_m2} — AB (ref >90)
Glucose: 91 mg/dl (ref 70–140)
POTASSIUM: 4.1 meq/L (ref 3.5–5.1)
Sodium: 141 mEq/L (ref 136–145)
Total Bilirubin: 0.45 mg/dL (ref 0.20–1.20)
Total Protein: 6.7 g/dL (ref 6.4–8.3)

## 2016-11-29 LAB — CBC WITH DIFFERENTIAL/PLATELET
BASO%: 0.5 % (ref 0.0–2.0)
BASOS ABS: 0 10*3/uL (ref 0.0–0.1)
EOS ABS: 0.2 10*3/uL (ref 0.0–0.5)
EOS%: 3.3 % (ref 0.0–7.0)
HEMATOCRIT: 40.1 % (ref 38.4–49.9)
HEMOGLOBIN: 13.1 g/dL (ref 13.0–17.1)
LYMPH#: 0.7 10*3/uL — AB (ref 0.9–3.3)
LYMPH%: 14.2 % (ref 14.0–49.0)
MCH: 29.6 pg (ref 27.2–33.4)
MCHC: 32.6 g/dL (ref 32.0–36.0)
MCV: 90.9 fL (ref 79.3–98.0)
MONO#: 0.4 10*3/uL (ref 0.1–0.9)
MONO%: 6.8 % (ref 0.0–14.0)
NEUT#: 3.9 10*3/uL (ref 1.5–6.5)
NEUT%: 75.2 % — ABNORMAL HIGH (ref 39.0–75.0)
Platelets: 174 10*3/uL (ref 140–400)
RBC: 4.41 10*6/uL (ref 4.20–5.82)
RDW: 15.1 % — AB (ref 11.0–14.6)
WBC: 5.2 10*3/uL (ref 4.0–10.3)

## 2016-11-29 LAB — TSH: TSH: 3.561 m[IU]/L (ref 0.320–4.118)

## 2016-11-29 MED ORDER — HEPARIN SOD (PORK) LOCK FLUSH 100 UNIT/ML IV SOLN
500.0000 [IU] | Freq: Once | INTRAVENOUS | Status: AC | PRN
  Administered 2016-11-29: 500 [IU]
  Filled 2016-11-29: qty 5

## 2016-11-29 MED ORDER — SODIUM CHLORIDE 0.9 % IV SOLN
Freq: Once | INTRAVENOUS | Status: AC
  Administered 2016-11-29: 09:00:00 via INTRAVENOUS

## 2016-11-29 MED ORDER — OXYCODONE HCL 15 MG PO TABS: 15.0000 mg | ORAL_TABLET | Freq: Three times a day (TID) | ORAL | 0 refills | Status: DC | PRN

## 2016-11-29 MED ORDER — SODIUM CHLORIDE 0.9% FLUSH
10.0000 mL | INTRAVENOUS | Status: DC | PRN
  Administered 2016-11-29: 10 mL
  Filled 2016-11-29: qty 10

## 2016-11-29 MED ORDER — LEVOTHYROXINE SODIUM 25 MCG PO TABS: 25.0000 ug | ORAL_TABLET | Freq: Every day | ORAL | 1 refills | Status: DC

## 2016-11-29 MED ORDER — SODIUM CHLORIDE 0.9 % IJ SOLN
10.0000 mL | INTRAMUSCULAR | Status: DC | PRN
Start: 2016-11-29 — End: 2023-04-27
  Administered 2016-11-29: 10 mL via INTRAVENOUS
  Filled 2016-11-29: qty 10

## 2016-11-29 MED ORDER — SODIUM CHLORIDE 0.9 % IV SOLN
200.0000 mg | Freq: Once | INTRAVENOUS | Status: AC
  Administered 2016-11-29: 200 mg via INTRAVENOUS
  Filled 2016-11-29: qty 8

## 2016-11-29 MED FILL — oxyCODONE HCL 15 MG TABS: 15 | 30 days supply | Qty: 90 | Fill #0

## 2016-11-29 NOTE — Patient Instructions (Signed)
Implanted Port Home Guide An implanted port is a type of central line that is placed under the skin. Central lines are used to provide IV access when treatment or nutrition needs to be given through a person's veins. Implanted ports are used for long-term IV access. An implanted port may be placed because:  You need IV medicine that would be irritating to the small veins in your hands or arms.  You need long-term IV medicines, such as antibiotics.  You need IV nutrition for a long period.  You need frequent blood draws for lab tests.  You need dialysis.  Implanted ports are usually placed in the chest area, but they can also be placed in the upper arm, the abdomen, or the leg. An implanted port has two main parts:  Reservoir. The reservoir is round and will appear as a small, raised area under your skin. The reservoir is the part where a needle is inserted to give medicines or draw blood.  Catheter. The catheter is a thin, flexible tube that extends from the reservoir. The catheter is placed into a large vein. Medicine that is inserted into the reservoir goes into the catheter and then into the vein.  How will I care for my incision site? Do not get the incision site wet. Bathe or shower as directed by your health care provider. How is my port accessed? Special steps must be taken to access the port:  Before the port is accessed, a numbing cream can be placed on the skin. This helps numb the skin over the port site.  Your health care provider uses a sterile technique to access the port. ? Your health care provider must put on a mask and sterile gloves. ? The skin over your port is cleaned carefully with an antiseptic and allowed to dry. ? The port is gently pinched between sterile gloves, and a needle is inserted into the port.  Only "non-coring" port needles should be used to access the port. Once the port is accessed, a blood return should be checked. This helps ensure that the port  is in the vein and is not clogged.  If your port needs to remain accessed for a constant infusion, a clear (transparent) bandage will be placed over the needle site. The bandage and needle will need to be changed every week, or as directed by your health care provider.  Keep the bandage covering the needle clean and dry. Do not get it wet. Follow your health care provider's instructions on how to take a shower or bath while the port is accessed.  If your port does not need to stay accessed, no bandage is needed over the port.  What is flushing? Flushing helps keep the port from getting clogged. Follow your health care provider's instructions on how and when to flush the port. Ports are usually flushed with saline solution or a medicine called heparin. The need for flushing will depend on how the port is used.  If the port is used for intermittent medicines or blood draws, the port will need to be flushed: ? After medicines have been given. ? After blood has been drawn. ? As part of routine maintenance.  If a constant infusion is running, the port may not need to be flushed.  How long will my port stay implanted? The port can stay in for as long as your health care provider thinks it is needed. When it is time for the port to come out, surgery will be   done to remove it. The procedure is similar to the one performed when the port was put in. When should I seek immediate medical care? When you have an implanted port, you should seek immediate medical care if:  You notice a bad smell coming from the incision site.  You have swelling, redness, or drainage at the incision site.  You have more swelling or pain at the port site or the surrounding area.  You have a fever that is not controlled with medicine.  This information is not intended to replace advice given to you by your health care provider. Make sure you discuss any questions you have with your health care provider. Document  Released: 04/04/2005 Document Revised: 09/10/2015 Document Reviewed: 12/10/2012 Elsevier Interactive Patient Education  2017 [employer]  

## 2016-11-29 NOTE — Assessment & Plan Note (Signed)
The patient has complete response on recent imaging study He will continue treatment indefinitely I do not plan to repeat imaging study again until the end of the year, due around November 2018

## 2016-11-29 NOTE — Progress Notes (Signed)
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 FNA of right level III lymph node was inconclusive for cancer      07/25/2013 Imaging    PET scan showed locally advanced disease with abnormal lymphadenopathy in the right axilla      08/06/2013 Procedure    CT-guided biopsy of the lymphadenopathy was negative for malignancy      08/15/2013 Surgery    Patient has placement of port and feeding tube      08/19/2013 - 09/10/2013 Chemotherapy    Patient received chemotherapy with cisplatin. The patient only received 2 doses due to uncontrolled nausea and acute renal failure.      08/19/2013 - 10/15/2013 Radiation Therapy    Received Helical IMRT Tomotherapy:  Right Tonstil and bilateral neck / 70 Gy in 35 fractions to gross disease, 63 Gy in 35 fractions to high risk nodal echelons, and 56 Gy in 35 fractions to intermediate risk nodal echelons.      08/27/2013 - 08/30/2013 Hospital Admission    The patient was admitted to the hospital for uncontrolled nausea vomiting and dehydration.      02/14/2014 Imaging    PET/CT scan showed complete response to treatment      03/19/2014 Surgery    He had  excisional lymph node biopsy from the right neck. Pathology was benign      05/13/2014 Surgery    He had removal of Port-A-Cath.      05/22/2014 Imaging    Repeat CT scan of the neck show no evidence of disease recurrence.      12/09/2014 Imaging    Ct neck without contrast showed persistent abnormalities on the right side of neck, indeterminate      12/25/2014 Imaging    PET CT scan showed disease recurrence.      02/10/2015 Procedure    He has placement of port      02/13/2015 - 07/13/2015 Chemotherapy    He received chemotherapy with carbo/Taxol      04/21/2015 Imaging    PET CT scan showed improved disease control      08/04/2015 Imaging    PET scan showed persistent disease      08/17/2015 -  Chemotherapy    He was started with [facility]      10/19/2015 Imaging    Ct neck showed mass-like intermediate density soft tissue at the right lateral neck recurrence site stable.       02/26/2016 Imaging    CT neck showed unchanged appearance of right neck recurrence compared to 10/19/2015 CT. No noncontrast evidence of new metastatic disease in the neck. Chronic sinusitis, progressed.      02/26/2016 Imaging    Diffuse but  patchy and asymmetric partial airspace filling process (ground-glass opacity) in the lungs. This could be due to respiratory bronchiolitis, hypersensitivity pneumonitis or possible drug reaction. Atypical/viral pneumonia is also possible. Pulmonary consultation may be a helpful. A three-month follow-up noncontrast chest CT is suggested. Slight interval enlargement of mediastinal lymph nodes and a small lymph node along the left major fissure. This is most likely due to the inflammatory process in the lungs. No findings for metastatic disease involving the chest. No findings for upper abdominal metastatic disease.      02/29/2016 Adverse Reaction    His treatment is placed on hold due to possible hypersensitivity pneumonitis/drug reaction      04/14/2016 Imaging     Ct chest showed no evidence for metastatic disease within the chest. Significant interval improvement and near complete resolution of previously described diffuse bilateral predominately ground-glass pulmonary opacities, most compatible with resolving infectious/inflammatory process.      09/05/2016 Imaging    CT scan of neck and chest  1. Unchanged appearance of right neck recurrence. 2. No evidence of new metastatic disease in the neck. 3. Unremarkable and stable CT appearance of the chest. No findings suspicious for metastatic disease       INTERVAL HISTORY: Please see below for problem oriented charting. He returns for further follow-up He has dental appointment scheduled He continues to have chronic neck pain, stable Denies new lymphadenopathy He is taking thyroid medicine as instructed.  REVIEW OF SYSTEMS:   Constitutional: Denies fevers, chills or abnormal weight loss Eyes: Denies blurriness of vision Ears, nose, mouth, throat, and face: Denies mucositis or sore throat Respiratory: Denies cough, dyspnea or wheezes Cardiovascular: Denies palpitation, chest discomfort or lower extremity swelling Gastrointestinal:  Denies nausea, heartburn or change in bowel habits Skin: Denies abnormal skin rashes Lymphatics: Denies new lymphadenopathy or easy bruising Neurological:Denies numbness, tingling or new weaknesses Behavioral/Psych: Mood is stable, no new changes  All other systems were reviewed with the patient and are negative.  I have reviewed the past medical history, past surgical history, social history and family history with the patient and they are unchanged from previous note.  ALLERGIES:  is allergic to pollen extract.  MEDICATIONS:  Current Outpatient Prescriptions  Medication Sig Dispense Refill  . fluticasone (FLONASE) 50 MCG/ACT nasal spray Place 2 sprays into both nostrils daily. 16 g 2  . levothyroxine (SYNTHROID, LEVOTHROID) 25 MCG tablet Take 1 tablet (25  mcg total) by mouth daily before breakfast. 30 tablet 1  . lidocaine-prilocaine (EMLA) cream Apply 1 application topically as needed. 30 g 6  . Multiple Vitamin (MULTIVITAMIN WITH MINERALS) TABS tablet Take 1 tablet by mouth daily.    Marland Kitchen omeprazole (PRILOSEC) 20 MG capsule Take 1 capsule (20 mg total) by mouth daily. 30 capsule 0  . oxyCODONE (ROXICODONE) 15 MG immediate release tablet Take 1 tablet (15 mg total) by mouth every 8 (eight) hours as needed. 90 tablet 0  . traZODone (DESYREL) 150 MG tablet Take 1 tablet (150 mg total) by mouth at bedtime. 30 tablet 11   No current facility-administered medications for this visit.    Facility-Administered Medications Ordered in Other Visits  Medication Dose Route Frequency Provider Last Rate Last Dose  . sodium chloride 0.9 % injection 10 mL  10 mL Intravenous PRN Alvy Bimler, Phylliss Strege, MD   10 mL at 11/29/16 0818    PHYSICAL EXAMINATION: ECOG PERFORMANCE STATUS: 0 - Asymptomatic  Vitals:   11/29/16 0832  BP: (!) 140/94  Pulse: 74  Resp: 18  Temp: 99 F (37.2 C)  SpO2: 100%   Filed Weights   11/29/16 0832  Weight: 287 lb 1.6 oz (130.2 kg)    GENERAL:alert, no distress and comfortable SKIN: skin color, texture, turgor are normal, no rashes or significant lesions EYES: normal, Conjunctiva are pink and non-injected, sclera clear OROPHARYNX:no exudate, no erythema and lips, buccal mucosa, and tongue normal.  Poor dentition is noted NECK: Well-healed surgical scar.  Neck is woody from prior radiation and surgery.  No other abnormalities LYMPH:  no palpable lymphadenopathy in the cervical, axillary or inguinal LUNGS: clear to auscultation and percussion with normal breathing effort HEART: regular rate & rhythm and no murmurs and no lower extremity edema ABDOMEN:abdomen soft, non-tender and normal bowel sounds Musculoskeletal:no cyanosis of digits and no clubbing  NEURO: alert & oriented x 3 with fluent speech, no focal motor/sensory  deficits  LABORATORY DATA:  I have reviewed the data as listed    Component Value Date/Time   NA 141 11/29/2016 0813   K 4.1 11/29/2016 0813   CL 107 07/23/2015 1513   CO2 26 11/29/2016 0813   GLUCOSE 91 11/29/2016 0813   BUN 13.4 11/29/2016 0813   CREATININE 1.7 (H) 11/29/2016 0813   CALCIUM 9.3 11/29/2016 0813   PROT 6.7 11/29/2016 0813   ALBUMIN 3.7 11/29/2016 0813   AST 22 11/29/2016 0813   ALT 19 11/29/2016 0813   ALKPHOS 59 11/29/2016 0813   BILITOT 0.45 11/29/2016 0813   GFRNONAA 44 (L) 07/23/2015 1513   GFRAA 51 (L) 07/23/2015 1513    No results found for: SPEP, UPEP  Lab Results  Component Value Date   WBC 5.2 11/29/2016   NEUTROABS 3.9 11/29/2016   HGB 13.1 11/29/2016   HCT 40.1 11/29/2016   MCV 90.9 11/29/2016   PLT 174 11/29/2016      Chemistry      Component Value Date/Time   NA 141 11/29/2016 0813   K 4.1 11/29/2016 0813   CL 107 07/23/2015 1513   CO2 26 11/29/2016 0813   BUN 13.4 11/29/2016 0813   CREATININE 1.7 (H) 11/29/2016 0813      Component Value Date/Time   CALCIUM 9.3 11/29/2016 0813   ALKPHOS 59 11/29/2016 0813   AST 22 11/29/2016 0813   ALT 19 11/29/2016 0813   BILITOT 0.45 11/29/2016 0813       ASSESSMENT & PLAN:  Tonsillar cancer The patient has complete response on recent imaging study He will continue treatment indefinitely I do not plan to repeat imaging study again until the end of the year, due around November 2018  Chronic kidney disease (CKD), stage II (mild) His kidney function is stable. 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 His pain is intermittent in nature and I reassured the patient it is not due  to cancer progression I recommend we continue the dose of oxycodone to 15 mg every 8 hours as needed for pain  Acquired hypothyroidism He is taking his thyroid medicine as instructed We will continue to monitor his thyroid function tests periodically while on treatment.   No orders of the defined types were placed in this encounter.  All questions were answered. The patient knows to call  the clinic with any problems, questions or concerns. No barriers to learning was detected. I spent 15 minutes counseling the patient face to face. The total time spent in the appointment was 20 minutes and more than 50% was on counseling and review of test results     [name], MD 11/29/2016 9:04 AM

## 2016-11-29 NOTE — Assessment & Plan Note (Signed)
He is taking his thyroid medicine as instructed We will continue to monitor his thyroid function tests periodically while on treatment. 

## 2016-11-29 NOTE — Telephone Encounter (Signed)
Spoke with patient regarding his upcoming appts. Did not want an avs or calendar.

## 2016-11-29 NOTE — Assessment & Plan Note (Signed)
The patient has cancer associated pain. His pain is intermittent in nature and I reassured the patient it is not due to cancer progression I recommend we continue the dose of oxycodone to 15 mg every 8 hours as needed for pain 

## 2016-11-29 NOTE — Patient Instructions (Signed)
[facility] Discharge Instructions for Patients Receiving Chemotherapy  Today you received the following chemotherapy agents: Keytruda   To help prevent nausea and vomiting after your treatment, we encourage you to take your nausea medication as directed    If you develop nausea and vomiting that is not controlled by your nausea medication, call the clinic.   BELOW ARE SYMPTOMS THAT SHOULD BE REPORTED IMMEDIATELY:  *FEVER GREATER THAN 100.5 F  *CHILLS WITH OR WITHOUT FEVER  NAUSEA AND VOMITING THAT IS NOT CONTROLLED WITH YOUR NAUSEA MEDICATION  *UNUSUAL SHORTNESS OF BREATH  *UNUSUAL BRUISING OR BLEEDING  TENDERNESS IN MOUTH AND THROAT WITH OR WITHOUT PRESENCE OF ULCERS  *URINARY PROBLEMS  *BOWEL PROBLEMS  UNUSUAL RASH Items with * indicate a potential emergency and should be followed up as soon as possible.  Feel free to call the clinic you have any questions or concerns. The clinic phone number is [phone].  Please show the CHEMO ALERT CARD at check-in to the Emergency Department and triage nurse.   

## 2016-11-29 NOTE — Assessment & Plan Note (Signed)
His kidney function is stable. As long as his creatinine clearance remain greater than 30, we will continue chemotherapy Monitor only 

## 2016-11-29 NOTE — Assessment & Plan Note (Signed)
The patient has poor dentition He is at risk of dental caries and osteonecrosis of the jaw without adequate dental care He has been referred to community dental clinic

## 2016-12-16 ENCOUNTER — Telehealth: Payer: Self-pay | Admitting: *Deleted

## 2016-12-16 MED ORDER — LEVOTHYROXINE SODIUM 25 MCG PO TABS: 25.0000 ug | ORAL_TABLET | Freq: Every day | ORAL | 1 refills | Status: DC

## 2016-12-16 NOTE — Telephone Encounter (Signed)
"  A refill was sent to [facility], I've not been there in over a year.  The pharmacy sent the order back to the office.  What can be done to send this to the correct pharmacy?"  Adjusted preferred pharmacy list as directed per patient.  The 11-29-2016 order re-sent as previously ordered to [facility].  Denies any further questions or needs at this time.

## 2016-12-22 MED FILL — LEVOTHYROXINE 25 MCG TABLET: 25 | 30 days supply | Qty: 30 | Fill #0

## 2016-12-27 ENCOUNTER — Ambulatory Visit (HOSPITAL_BASED_OUTPATIENT_CLINIC_OR_DEPARTMENT_OTHER): Payer: Medicaid Other | Admitting: Hematology and Oncology

## 2016-12-27 ENCOUNTER — Other Ambulatory Visit (HOSPITAL_BASED_OUTPATIENT_CLINIC_OR_DEPARTMENT_OTHER): Payer: Medicaid Other

## 2016-12-27 ENCOUNTER — Ambulatory Visit: Payer: Medicaid Other

## 2016-12-27 ENCOUNTER — Encounter: Payer: Self-pay | Admitting: Hematology and Oncology

## 2016-12-27 ENCOUNTER — Telehealth: Payer: Self-pay | Admitting: Hematology and Oncology

## 2016-12-27 ENCOUNTER — Ambulatory Visit (HOSPITAL_BASED_OUTPATIENT_CLINIC_OR_DEPARTMENT_OTHER): Payer: Medicaid Other

## 2016-12-27 DIAGNOSIS — K089 Disorder of teeth and supporting structures, unspecified: Secondary | ICD-10-CM

## 2016-12-27 DIAGNOSIS — Z5111 Encounter for antineoplastic chemotherapy: Secondary | ICD-10-CM

## 2016-12-27 DIAGNOSIS — C099 Malignant neoplasm of tonsil, unspecified: Secondary | ICD-10-CM

## 2016-12-27 DIAGNOSIS — Z79899 Other long term (current) drug therapy: Secondary | ICD-10-CM

## 2016-12-27 DIAGNOSIS — Z5112 Encounter for antineoplastic immunotherapy: Secondary | ICD-10-CM | POA: Diagnosis present

## 2016-12-27 DIAGNOSIS — N182 Chronic kidney disease, stage 2 (mild): Secondary | ICD-10-CM

## 2016-12-27 DIAGNOSIS — G893 Neoplasm related pain (acute) (chronic): Secondary | ICD-10-CM | POA: Diagnosis not present

## 2016-12-27 LAB — CBC WITH DIFFERENTIAL/PLATELET
BASO%: 0.4 % (ref 0.0–2.0)
BASOS ABS: 0 10*3/uL (ref 0.0–0.1)
EOS ABS: 0.2 10*3/uL (ref 0.0–0.5)
EOS%: 3.9 % (ref 0.0–7.0)
HEMATOCRIT: 39.9 % (ref 38.4–49.9)
HEMOGLOBIN: 13 g/dL (ref 13.0–17.1)
LYMPH#: 0.7 10*3/uL — AB (ref 0.9–3.3)
LYMPH%: 11.7 % — ABNORMAL LOW (ref 14.0–49.0)
MCH: 29.5 pg (ref 27.2–33.4)
MCHC: 32.5 g/dL (ref 32.0–36.0)
MCV: 90.8 fL (ref 79.3–98.0)
MONO#: 0.3 10*3/uL (ref 0.1–0.9)
MONO%: 6 % (ref 0.0–14.0)
NEUT#: 4.4 10*3/uL (ref 1.5–6.5)
NEUT%: 78 % — ABNORMAL HIGH (ref 39.0–75.0)
PLATELETS: 198 10*3/uL (ref 140–400)
RBC: 4.4 10*6/uL (ref 4.20–5.82)
RDW: 15 % — ABNORMAL HIGH (ref 11.0–14.6)
WBC: 5.6 10*3/uL (ref 4.0–10.3)

## 2016-12-27 LAB — COMPREHENSIVE METABOLIC PANEL
ALBUMIN: 3.8 g/dL (ref 3.5–5.0)
ALK PHOS: 64 U/L (ref 40–150)
ALT: 15 U/L (ref 0–55)
ANION GAP: 8 meq/L (ref 3–11)
AST: 21 U/L (ref 5–34)
BUN: 14.8 mg/dL (ref 7.0–26.0)
CALCIUM: 9.4 mg/dL (ref 8.4–10.4)
CO2: 25 mEq/L (ref 22–29)
Chloride: 106 mEq/L (ref 98–109)
Creatinine: 1.7 mg/dL — ABNORMAL HIGH (ref 0.7–1.3)
EGFR: 53 mL/min/{1.73_m2} — ABNORMAL LOW (ref >90)
Glucose: 92 mg/dl (ref 70–140)
POTASSIUM: 4.2 meq/L (ref 3.5–5.1)
Sodium: 140 mEq/L (ref 136–145)
Total Bilirubin: 0.39 mg/dL (ref 0.20–1.20)
Total Protein: 6.9 g/dL (ref 6.4–8.3)

## 2016-12-27 LAB — TSH: TSH: 2.514 m(IU)/L (ref 0.320–4.118)

## 2016-12-27 MED ORDER — SODIUM CHLORIDE 0.9 % IV SOLN
Freq: Once | INTRAVENOUS | Status: AC
  Administered 2016-12-27: 09:00:00 via INTRAVENOUS

## 2016-12-27 MED ORDER — SODIUM CHLORIDE 0.9% FLUSH
10.0000 mL | INTRAVENOUS | Status: DC | PRN
  Administered 2016-12-27: 10 mL
  Filled 2016-12-27: qty 10

## 2016-12-27 MED ORDER — OXYCODONE HCL 15 MG PO TABS: 15.0000 mg | ORAL_TABLET | Freq: Three times a day (TID) | ORAL | 0 refills | Status: DC | PRN

## 2016-12-27 MED ORDER — HEPARIN SOD (PORK) LOCK FLUSH 100 UNIT/ML IV SOLN
500.0000 [IU] | Freq: Once | INTRAVENOUS | Status: AC | PRN
  Administered 2016-12-27: 500 [IU]
  Filled 2016-12-27: qty 5

## 2016-12-27 MED ORDER — SODIUM CHLORIDE 0.9 % IJ SOLN
10.0000 mL | INTRAMUSCULAR | Status: DC | PRN
  Administered 2016-12-27: 10 mL via INTRAVENOUS
  Filled 2016-12-27: qty 10

## 2016-12-27 MED ORDER — PEMBROLIZUMAB CHEMO INJECTION 100 MG/4ML
200.0000 mg | Freq: Once | INTRAVENOUS | Status: AC
  Administered 2016-12-27: 200 mg via INTRAVENOUS
  Filled 2016-12-27: qty 8

## 2016-12-27 MED FILL — oxyCODONE HCL 15 MG TABS: 15 | 30 days supply | Qty: 90 | Fill #0

## 2016-12-27 NOTE — Assessment & Plan Note (Signed)
The patient has cancer associated pain. His pain is intermittent in nature and I reassured the patient it is not due to cancer progression I recommend we continue the dose of oxycodone to 15 mg every 8 hours as needed for pain

## 2016-12-27 NOTE — Assessment & Plan Note (Signed)
His kidney function is stable. As long as his creatinine clearance remain greater than 30, we will continue chemotherapy Monitor only

## 2016-12-27 NOTE — Patient Instructions (Signed)
[facility] Discharge Instructions for Patients Receiving Chemotherapy  Today you received the following chemotherapy agents keytruda   To help prevent nausea and vomiting after your treatment, we encourage you to take your nausea medication as directed  If you develop nausea and vomiting that is not controlled by your nausea medication, call the clinic.   BELOW ARE SYMPTOMS THAT SHOULD BE REPORTED IMMEDIATELY:  *FEVER GREATER THAN 100.5 F  *CHILLS WITH OR WITHOUT FEVER  NAUSEA AND VOMITING THAT IS NOT CONTROLLED WITH YOUR NAUSEA MEDICATION  *UNUSUAL SHORTNESS OF BREATH  *UNUSUAL BRUISING OR BLEEDING  TENDERNESS IN MOUTH AND THROAT WITH OR WITHOUT PRESENCE OF ULCERS  *URINARY PROBLEMS  *BOWEL PROBLEMS  UNUSUAL RASH Items with * indicate a potential emergency and should be followed up as soon as possible.  Feel free to call the clinic you have any questions or concerns. The clinic phone number is [phone].  

## 2016-12-27 NOTE — Assessment & Plan Note (Signed)
The patient has complete response on recent imaging study in May 2018 He will continue treatment indefinitely I do not plan to repeat imaging study again until the end of the year, due around November 2018

## 2016-12-27 NOTE — Telephone Encounter (Signed)
Gave patient AVS and calendar of upcoming October appointments °

## 2016-12-27 NOTE — Assessment & Plan Note (Addendum)
The patient has poor dentition He is at risk of dental caries and osteonecrosis of the jaw without adequate dental care He has been referred to community dental clinic, appointment pending

## 2016-12-27 NOTE — Progress Notes (Signed)
Kidney function stable.  SCr value same as last office visit, addressed in note per Dr. Alvy Bimler.  Per treatment plan can treat with SCr less than 2.5

## 2016-12-27 NOTE — Progress Notes (Signed)
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 FNA of right level III lymph node was inconclusive for cancer      07/25/2013 Imaging    PET scan showed locally advanced disease with abnormal lymphadenopathy in the right axilla      08/06/2013 Procedure    CT-guided biopsy of the lymphadenopathy was negative for malignancy      08/15/2013 Surgery    Patient has placement of port and feeding tube      08/19/2013 - 09/10/2013 Chemotherapy    Patient received chemotherapy with cisplatin. The patient only received 2 doses due to uncontrolled nausea and acute renal failure.      08/19/2013 - 10/15/2013 Radiation Therapy    Received Helical IMRT Tomotherapy:  Right Tonstil and bilateral neck / 70 Gy in 35 fractions to gross disease, 63 Gy in 35 fractions to high risk nodal echelons, and 56 Gy in 35 fractions to intermediate risk nodal echelons.      08/27/2013 - 08/30/2013 Hospital Admission    The patient was admitted to the hospital for uncontrolled nausea vomiting and dehydration.      02/14/2014 Imaging    PET/CT scan showed complete response to treatment      03/19/2014 Surgery    He had  excisional lymph node biopsy from the right neck. Pathology was benign      05/13/2014 Surgery    He had removal of Port-A-Cath.      05/22/2014 Imaging    Repeat CT scan of the neck show no evidence of disease recurrence.      12/09/2014 Imaging    Ct neck without contrast showed persistent abnormalities on the right side of neck, indeterminate      12/25/2014 Imaging    PET CT scan showed disease recurrence.      02/10/2015 Procedure    He has placement of port      02/13/2015 - 07/13/2015 Chemotherapy    He received chemotherapy with carbo/Taxol      04/21/2015 Imaging    PET CT scan showed improved disease control      08/04/2015 Imaging    PET scan showed persistent disease      08/17/2015 -  Chemotherapy    He was started with [facility]      10/19/2015 Imaging    Ct neck showed mass-like intermediate density soft tissue at the right lateral neck recurrence site stable.       02/26/2016 Imaging    CT neck showed unchanged appearance of right neck recurrence compared to 10/19/2015 CT. No noncontrast evidence of new metastatic disease in the neck. Chronic sinusitis, progressed.      02/26/2016 Imaging    Diffuse but  patchy and asymmetric partial airspace filling process (ground-glass opacity) in the lungs. This could be due to respiratory bronchiolitis, hypersensitivity pneumonitis or possible drug reaction. Atypical/viral pneumonia is also possible. Pulmonary consultation may be a helpful. A three-month follow-up noncontrast chest CT is suggested. Slight interval enlargement of mediastinal lymph nodes and a small lymph node along the left major fissure. This is most likely due to the inflammatory process in the lungs. No findings for metastatic disease involving the chest. No findings for upper abdominal metastatic disease.      02/29/2016 Adverse Reaction    His treatment is placed on hold due to possible hypersensitivity pneumonitis/drug reaction      04/14/2016 Imaging     Ct chest showed no evidence for metastatic disease within the chest. Significant interval improvement and near complete resolution of previously described diffuse bilateral predominately ground-glass pulmonary opacities, most compatible with resolving infectious/inflammatory process.      09/05/2016 Imaging    CT scan of neck and chest  1. Unchanged appearance of right neck recurrence. 2. No evidence of new metastatic disease in the neck. 3. Unremarkable and stable CT appearance of the chest. No findings suspicious for metastatic disease       INTERVAL HISTORY: Please see below for problem oriented charting. He is seen before his treatment He has not seen a dentist yet due to long waiting list He denies pain in his jaw No new neck lymphadenopathy He continues to have intermittent pain in the neck, stable Denies recent fever or chills He has some mild sinus congestion and nasal drainage He takes over-the-counter remedies for that  REVIEW OF SYSTEMS:   Constitutional: Denies fevers, chills or abnormal weight loss Eyes: Denies blurriness of vision Ears, nose, mouth, throat, and face: Denies mucositis or sore throat Respiratory: Denies cough, dyspnea or wheezes Cardiovascular: Denies palpitation, chest discomfort or lower extremity swelling Gastrointestinal:  Denies nausea, heartburn or change in bowel habits Skin: Denies abnormal skin rashes Lymphatics: Denies new lymphadenopathy or easy bruising Neurological:Denies numbness, tingling or new weaknesses Behavioral/Psych: Mood is stable, no new changes  All other systems were reviewed with the patient and are negative.  I have reviewed the past medical history, past surgical history, social history and family history with the patient and they are unchanged from previous note.  ALLERGIES:  is allergic to pollen extract.  MEDICATIONS:  Current Outpatient Prescriptions  Medication Sig Dispense Refill  . fluticasone (FLONASE) 50  MCG/ACT nasal spray Place 2 sprays into both nostrils daily. 16 g 2  . levothyroxine (SYNTHROID, LEVOTHROID) 25 MCG tablet Take 1 tablet (25 mcg total) by mouth daily before breakfast. 30 tablet 1  . lidocaine-prilocaine (EMLA) cream Apply 1 application topically as needed. 30 g 6  . Multiple Vitamin (MULTIVITAMIN WITH MINERALS) TABS tablet Take 1 tablet by mouth daily.    Marland Kitchen omeprazole (PRILOSEC) 20 MG capsule Take 1 capsule (20 mg total) by mouth daily. 30 capsule 0  . oxyCODONE (ROXICODONE) 15 MG immediate release tablet Take 1 tablet (15 mg total) by mouth every 8 (eight) hours as needed. 90 tablet 0  . traZODone (DESYREL) 150 MG tablet Take 1 tablet (150 mg total) by mouth at bedtime. 30 tablet 11   No current facility-administered medications for this visit.    Facility-Administered Medications Ordered in Other Visits  Medication Dose Route Frequency Provider Last Rate Last Dose  . sodium chloride 0.9 % injection 10 mL  10 mL Intravenous PRN Alvy Bimler, Lucca Greggs, MD   10 mL  at 11/29/16 0818  . sodium chloride flush (NS) 0.9 % injection 10 mL  10 mL Intracatheter PRN Alvy Bimler, Tamika Shropshire, MD   10 mL at 12/27/16 1044    PHYSICAL EXAMINATION: ECOG PERFORMANCE STATUS: 0 - Asymptomatic  Vitals:   12/27/16 0844  BP: (!) 141/85  Pulse: 74  Resp: 20  Temp: 98.2 F (36.8 C)  SpO2: 100%   Filed Weights   12/27/16 0844  Weight: 284 lb (128.8 kg)    GENERAL:alert, no distress and comfortable SKIN: skin color, texture, turgor are normal, no rashes or significant lesions EYES: normal, Conjunctiva are pink and non-injected, sclera clear OROPHARYNX:no exudate, no erythema and lips, buccal mucosa, and tongue normal.  Poor dentition is noted NECK: Significant neck fibrosis and scar LYMPH:  no palpable lymphadenopathy in the cervical, axillary or inguinal LUNGS: clear to auscultation and percussion with normal breathing effort HEART: regular rate & rhythm and no murmurs and no lower extremity  edema ABDOMEN:abdomen soft, non-tender and normal bowel sounds Musculoskeletal:no cyanosis of digits and no clubbing  NEURO: alert & oriented x 3 with fluent speech, no focal motor/sensory deficits  LABORATORY DATA:  I have reviewed the data as listed    Component Value Date/Time   NA 140 12/27/2016 0822   K 4.2 12/27/2016 0822   CL 107 07/23/2015 1513   CO2 25 12/27/2016 0822   GLUCOSE 92 12/27/2016 0822   BUN 14.8 12/27/2016 0822   CREATININE 1.7 (H) 12/27/2016 0822   CALCIUM 9.4 12/27/2016 0822   PROT 6.9 12/27/2016 0822   ALBUMIN 3.8 12/27/2016 0822   AST 21 12/27/2016 0822   ALT 15 12/27/2016 0822   ALKPHOS 64 12/27/2016 0822   BILITOT 0.39 12/27/2016 0822   GFRNONAA 44 (L) 07/23/2015 1513   GFRAA 51 (L) 07/23/2015 1513    No results found for: SPEP, UPEP  Lab Results  Component Value Date   WBC 5.6 12/27/2016   NEUTROABS 4.4 12/27/2016   HGB 13.0 12/27/2016   HCT 39.9 12/27/2016   MCV 90.8 12/27/2016   PLT 198 12/27/2016      Chemistry      Component Value Date/Time   NA 140 12/27/2016 0822   K 4.2 12/27/2016 0822   CL 107 07/23/2015 1513   CO2 25 12/27/2016 0822   BUN 14.8 12/27/2016 0822   CREATININE 1.7 (H) 12/27/2016 0822      Component Value Date/Time   CALCIUM 9.4 12/27/2016 0822   ALKPHOS 64 12/27/2016 0822   AST 21 12/27/2016 0822   ALT 15 12/27/2016 0822   BILITOT 0.39 12/27/2016 9371    ASSESSMENT & PLAN:  Tonsillar cancer The patient has complete response on recent imaging study in May 2018 He will continue treatment indefinitely I do not plan to repeat imaging study again until the end of the year, due around November 2018  Chronic kidney disease (CKD), stage II (mild) His kidney function is stable. As long as his creatinine clearance remain greater than 30, we will continue chemotherapy Monitor only  Cancer associated pain The patient has cancer associated pain. His pain is intermittent in nature and I reassured the patient it is  not due to cancer progression I recommend we continue the dose of oxycodone to 15 mg every 8 hours as needed for pain  Poor dentition The patient has poor dentition He is at risk of dental caries and osteonecrosis of the jaw without adequate dental care He has been referred to community dental clinic, appointment pending  No orders of the defined types were placed in this encounter.  All questions were answered. The patient knows to call the clinic with any problems, questions or concerns. No barriers to learning was detected. I spent 15 minutes counseling the patient face to face. The total time spent in the appointment was 20 minutes and more than 50% was on counseling and review of test results     [name], MD 12/27/2016 10:53 AM

## 2017-01-24 ENCOUNTER — Ambulatory Visit: Payer: Medicaid Other

## 2017-01-24 ENCOUNTER — Ambulatory Visit (HOSPITAL_BASED_OUTPATIENT_CLINIC_OR_DEPARTMENT_OTHER): Payer: Medicaid Other

## 2017-01-24 ENCOUNTER — Encounter: Payer: Self-pay | Admitting: Hematology and Oncology

## 2017-01-24 ENCOUNTER — Telehealth: Payer: Self-pay

## 2017-01-24 ENCOUNTER — Other Ambulatory Visit (HOSPITAL_BASED_OUTPATIENT_CLINIC_OR_DEPARTMENT_OTHER): Payer: Medicaid Other

## 2017-01-24 ENCOUNTER — Ambulatory Visit (HOSPITAL_BASED_OUTPATIENT_CLINIC_OR_DEPARTMENT_OTHER): Payer: Medicaid Other | Admitting: Hematology and Oncology

## 2017-01-24 DIAGNOSIS — Z79899 Other long term (current) drug therapy: Secondary | ICD-10-CM | POA: Diagnosis not present

## 2017-01-24 DIAGNOSIS — K089 Disorder of teeth and supporting structures, unspecified: Secondary | ICD-10-CM | POA: Diagnosis not present

## 2017-01-24 DIAGNOSIS — C099 Malignant neoplasm of tonsil, unspecified: Secondary | ICD-10-CM | POA: Diagnosis not present

## 2017-01-24 DIAGNOSIS — N182 Chronic kidney disease, stage 2 (mild): Secondary | ICD-10-CM

## 2017-01-24 DIAGNOSIS — Z5111 Encounter for antineoplastic chemotherapy: Secondary | ICD-10-CM

## 2017-01-24 DIAGNOSIS — G893 Neoplasm related pain (acute) (chronic): Secondary | ICD-10-CM

## 2017-01-24 DIAGNOSIS — Z5112 Encounter for antineoplastic immunotherapy: Secondary | ICD-10-CM | POA: Diagnosis present

## 2017-01-24 LAB — CBC WITH DIFFERENTIAL/PLATELET
BASO%: 0.5 % (ref 0.0–2.0)
Basophils Absolute: 0 10*3/uL (ref 0.0–0.1)
EOS%: 3.4 % (ref 0.0–7.0)
Eosinophils Absolute: 0.2 10*3/uL (ref 0.0–0.5)
HCT: 41.3 % (ref 38.4–49.9)
HGB: 13.3 g/dL (ref 13.0–17.1)
LYMPH%: 13.3 % — AB (ref 14.0–49.0)
MCH: 29.4 pg (ref 27.2–33.4)
MCHC: 32.2 g/dL (ref 32.0–36.0)
MCV: 91.3 fL (ref 79.3–98.0)
MONO#: 0.4 10*3/uL (ref 0.1–0.9)
MONO%: 6.6 % (ref 0.0–14.0)
NEUT%: 76.2 % — AB (ref 39.0–75.0)
NEUTROS ABS: 4.4 10*3/uL (ref 1.5–6.5)
Platelets: 179 10*3/uL (ref 140–400)
RBC: 4.53 10*6/uL (ref 4.20–5.82)
RDW: 14.8 % — ABNORMAL HIGH (ref 11.0–14.6)
WBC: 5.7 10*3/uL (ref 4.0–10.3)
lymph#: 0.8 10*3/uL — ABNORMAL LOW (ref 0.9–3.3)

## 2017-01-24 LAB — COMPREHENSIVE METABOLIC PANEL
ALT: 15 U/L (ref 0–55)
AST: 22 U/L (ref 5–34)
Albumin: 4 g/dL (ref 3.5–5.0)
Alkaline Phosphatase: 57 U/L (ref 40–150)
Anion Gap: 7 mEq/L (ref 3–11)
BILIRUBIN TOTAL: 0.6 mg/dL (ref 0.20–1.20)
BUN: 17.8 mg/dL (ref 7.0–26.0)
CHLORIDE: 106 meq/L (ref 98–109)
CO2: 27 meq/L (ref 22–29)
CREATININE: 1.8 mg/dL — AB (ref 0.7–1.3)
Calcium: 9.4 mg/dL (ref 8.4–10.4)
EGFR: 51 mL/min/{1.73_m2} — AB (ref >90)
GLUCOSE: 85 mg/dL (ref 70–140)
Potassium: 4.1 mEq/L (ref 3.5–5.1)
SODIUM: 141 meq/L (ref 136–145)
TOTAL PROTEIN: 7.2 g/dL (ref 6.4–8.3)

## 2017-01-24 LAB — TSH: TSH: 2.835 m(IU)/L (ref 0.320–4.118)

## 2017-01-24 MED ORDER — SODIUM CHLORIDE 0.9% FLUSH
10.0000 mL | INTRAVENOUS | Status: DC | PRN
  Administered 2017-01-24: 10 mL
  Filled 2017-01-24: qty 10

## 2017-01-24 MED ORDER — SODIUM CHLORIDE 0.9 % IV SOLN
Freq: Once | INTRAVENOUS | Status: AC
  Administered 2017-01-24: 13:00:00 via INTRAVENOUS

## 2017-01-24 MED ORDER — HEPARIN SOD (PORK) LOCK FLUSH 100 UNIT/ML IV SOLN
500.0000 [IU] | Freq: Once | INTRAVENOUS | Status: AC | PRN
  Administered 2017-01-24: 500 [IU]
  Filled 2017-01-24: qty 5

## 2017-01-24 MED ORDER — SODIUM CHLORIDE 0.9 % IV SOLN
200.0000 mg | Freq: Once | INTRAVENOUS | Status: AC
  Administered 2017-01-24: 200 mg via INTRAVENOUS
  Filled 2017-01-24: qty 8

## 2017-01-24 MED ORDER — OXYCODONE HCL 15 MG PO TABS: 15.0000 mg | ORAL_TABLET | Freq: Three times a day (TID) | ORAL | 0 refills | Status: DC | PRN

## 2017-01-24 MED FILL — oxyCODONE HCL 15 MG TABS: 15 | 30 days supply | Qty: 90 | Fill #0

## 2017-01-24 NOTE — Assessment & Plan Note (Signed)
The patient has complete response on recent imaging study in May 2018 He will continue treatment indefinitely I do not plan to repeat imaging study again until the end of the year, due around November 2018

## 2017-01-24 NOTE — Assessment & Plan Note (Signed)
His kidney function is stable. As long as his creatinine clearance remain greater than 30, we will continue chemotherapy Monitor only

## 2017-01-24 NOTE — Patient Instructions (Signed)
[facility] Discharge Instructions for Patients Receiving Chemotherapy  Today you received the following chemotherapy agents keytruda   To help prevent nausea and vomiting after your treatment, we encourage you to take your nausea medication as directed  If you develop nausea and vomiting that is not controlled by your nausea medication, call the clinic.   BELOW ARE SYMPTOMS THAT SHOULD BE REPORTED IMMEDIATELY:  *FEVER GREATER THAN 100.5 F  *CHILLS WITH OR WITHOUT FEVER  NAUSEA AND VOMITING THAT IS NOT CONTROLLED WITH YOUR NAUSEA MEDICATION  *UNUSUAL SHORTNESS OF BREATH  *UNUSUAL BRUISING OR BLEEDING  TENDERNESS IN MOUTH AND THROAT WITH OR WITHOUT PRESENCE OF ULCERS  *URINARY PROBLEMS  *BOWEL PROBLEMS  UNUSUAL RASH Items with * indicate a potential emergency and should be followed up as soon as possible.  Feel free to call the clinic you have any questions or concerns. The clinic phone number is [phone].  

## 2017-01-24 NOTE — Assessment & Plan Note (Signed)
The patient has cancer associated pain. His pain is intermittent in nature and I reassured the patient it is not due to cancer progression I recommend we continue the dose of oxycodone to 15 mg every 8 hours as needed for pain

## 2017-01-24 NOTE — Telephone Encounter (Signed)
No return visit. Per 10/9 los

## 2017-01-24 NOTE — Assessment & Plan Note (Signed)
He has extremely poor dentition that appears worse He has seen a recent dentist who recommended complete dental extraction I have provided him a letter of clearance from the oncology standpoint I recommend additional input from Dr. Tommie Raymond before he proceed with dental extraction due to risk of osteonecrosis or poor wound healing due to radiation exposure

## 2017-01-24 NOTE — Progress Notes (Signed)
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 FNA of right level III lymph node was inconclusive for cancer      07/25/2013 Imaging    PET scan showed locally advanced disease with abnormal lymphadenopathy in the right axilla      08/06/2013 Procedure    CT-guided biopsy of the lymphadenopathy was negative for malignancy      08/15/2013 Surgery    Patient has placement of port and feeding tube      08/19/2013 - 09/10/2013 Chemotherapy    Patient received chemotherapy with cisplatin. The patient only received 2 doses due to uncontrolled nausea and acute renal failure.      08/19/2013 - 10/15/2013 Radiation Therapy    Received Helical IMRT Tomotherapy:  Right Tonstil and bilateral neck / 70 Gy in 35 fractions to gross disease, 63 Gy in 35 fractions to high risk nodal echelons, and 56 Gy in 35 fractions to intermediate risk nodal echelons.      08/27/2013 - 08/30/2013 Hospital Admission    The patient was admitted to the hospital for uncontrolled nausea vomiting and dehydration.      02/14/2014 Imaging    PET/CT scan showed complete response to treatment      03/19/2014 Surgery    He had  excisional lymph node biopsy from the right neck. Pathology was benign      05/13/2014 Surgery    He had removal of Port-A-Cath.      05/22/2014 Imaging    Repeat CT scan of the neck show no evidence of disease recurrence.      12/09/2014 Imaging    Ct neck without contrast showed persistent abnormalities on the right side of neck, indeterminate      12/25/2014 Imaging    PET CT scan showed disease recurrence.      02/10/2015 Procedure    He has placement of port      02/13/2015 - 07/13/2015 Chemotherapy    He received chemotherapy with carbo/Taxol      04/21/2015 Imaging    PET CT scan showed improved disease control      08/04/2015 Imaging    PET scan showed persistent disease      08/17/2015 -  Chemotherapy    He was started with [facility]      10/19/2015 Imaging    Ct neck showed mass-like intermediate density soft tissue at the right lateral neck recurrence site stable.       02/26/2016 Imaging    CT neck showed unchanged appearance of right neck recurrence compared to 10/19/2015 CT. No noncontrast evidence of new metastatic disease in the neck. Chronic sinusitis, progressed.      02/26/2016 Imaging    Diffuse but  patchy and asymmetric partial airspace filling process (ground-glass opacity) in the lungs. This could be due to respiratory bronchiolitis, hypersensitivity pneumonitis or possible drug reaction. Atypical/viral pneumonia is also possible. Pulmonary consultation may be a helpful. A three-month follow-up noncontrast chest CT is suggested. Slight interval enlargement of mediastinal lymph nodes and a small lymph node along the left major fissure. This is most likely due to the inflammatory process in the lungs. No findings for metastatic disease involving the chest. No findings for upper abdominal metastatic disease.      02/29/2016 Adverse Reaction    His treatment is placed on hold due to possible hypersensitivity pneumonitis/drug reaction      04/14/2016 Imaging     Ct chest showed no evidence for metastatic disease within the chest. Significant interval improvement and near complete resolution of previously described diffuse bilateral predominately ground-glass pulmonary opacities, most compatible with resolving infectious/inflammatory process.      09/05/2016 Imaging    CT scan of neck and chest  1. Unchanged appearance of right neck recurrence. 2. No evidence of new metastatic disease in the neck. 3. Unremarkable and stable CT appearance of the chest. No findings suspicious for metastatic disease       INTERVAL HISTORY: Please see below for problem oriented charting. He returns for further follow-up His dentist recommended dental extraction he continues to take oxycodone intermittently for chronic neck pain related to prior surgery Denies recent infection No other side effects from chemotherapy  REVIEW OF SYSTEMS:   Constitutional: Denies fevers, chills or abnormal weight loss Eyes: Denies blurriness of vision Ears, nose, mouth, throat, and face: Denies mucositis or sore throat Respiratory: Denies cough, dyspnea or wheezes Cardiovascular: Denies palpitation, chest discomfort or lower extremity swelling Gastrointestinal:  Denies nausea, heartburn or change in bowel habits Skin: Denies abnormal skin rashes Lymphatics: Denies new lymphadenopathy or easy bruising Neurological:Denies numbness, tingling or new weaknesses Behavioral/Psych: Mood is stable, no new changes  All other systems were reviewed with the patient and are negative.  I have reviewed the past medical history, past surgical history, social history and family history with the patient and they are unchanged from previous note.  ALLERGIES:  is allergic to pollen extract.  MEDICATIONS:  Current Outpatient Prescriptions  Medication Sig Dispense Refill  . fluticasone (FLONASE) 50 MCG/ACT nasal spray Place 2 sprays into both nostrils daily. 16 g 2  . levothyroxine (SYNTHROID,  LEVOTHROID) 25 MCG tablet Take 1 tablet (25 mcg total) by mouth daily before breakfast. 30 tablet 1  . lidocaine-prilocaine (EMLA) cream Apply 1 application topically as needed. 30 g 6  . Multiple Vitamin (MULTIVITAMIN WITH MINERALS) TABS tablet Take 1 tablet by mouth daily.    Marland Kitchen omeprazole (PRILOSEC) 20 MG capsule Take 1 capsule (20 mg total) by mouth daily. 30 capsule 0  . oxyCODONE (ROXICODONE) 15 MG immediate release tablet Take 1 tablet (15 mg total) by mouth every 8 (eight) hours as needed. 90 tablet 0  . traZODone (DESYREL) 150 MG tablet Take 1 tablet (150 mg total) by mouth at bedtime. 30 tablet 11   No current facility-administered medications for this visit.    Facility-Administered Medications Ordered in Other Visits  Medication Dose Route Frequency Provider Last Rate Last Dose  . 0.9 %  sodium chloride infusion   Intravenous Once Katlen Seyer, MD      . heparin lock flush 100 unit/mL  500 Units Intracatheter Once PRN Alvy Bimler, Mate Alegria, MD      . pembrolizumab (KEYTRUDA) 200 mg in  sodium chloride 0.9 % 50 mL chemo infusion  200 mg Intravenous Once Sejla Marzano, MD      . sodium chloride 0.9 % injection 10 mL  10 mL Intravenous PRN Alvy Bimler, Nikoloz Huy, MD   10 mL at 11/29/16 0818  . sodium chloride flush (NS) 0.9 % injection 10 mL  10 mL Intracatheter PRN Alvy Bimler, Watt Geiler, MD        PHYSICAL EXAMINATION: ECOG PERFORMANCE STATUS: 1 - Symptomatic but completely ambulatory  Vitals:   01/24/17 1214  BP: 129/73  Pulse: 85  Resp: 20  Temp: 98.2 F (36.8 C)  SpO2: 98%   Filed Weights   01/24/17 1214  Weight: 285 lb (129.3 kg)    GENERAL:alert, no distress and comfortable SKIN: skin color, texture, turgor are normal, no rashes or significant lesions EYES: normal, Conjunctiva are pink and non-injected, sclera clear OROPHARYNX:no exudate, no erythema and lips, buccal mucosa, and tongue normal.  Noted very poor dental health NECK: Neck fibrosis and scarring from prior treatment/surgery LYMPH:  no  palpable lymphadenopathy in the cervical, axillary or inguinal LUNGS: clear to auscultation and percussion with normal breathing effort HEART: regular rate & rhythm and no murmurs and no lower extremity edema ABDOMEN:abdomen soft, non-tender and normal bowel sounds Musculoskeletal:no cyanosis of digits and no clubbing  NEURO: alert & oriented x 3 with fluent speech, no focal motor/sensory deficits  LABORATORY DATA:  I have reviewed the data as listed    Component Value Date/Time   NA 141 01/24/2017 1147   K 4.1 01/24/2017 1147   CL 107 07/23/2015 1513   CO2 27 01/24/2017 1147   GLUCOSE 85 01/24/2017 1147   BUN 17.8 01/24/2017 1147   CREATININE 1.8 (H) 01/24/2017 1147   CALCIUM 9.4 01/24/2017 1147   PROT 7.2 01/24/2017 1147   ALBUMIN 4.0 01/24/2017 1147   AST 22 01/24/2017 1147   ALT 15 01/24/2017 1147   ALKPHOS 57 01/24/2017 1147   BILITOT 0.60 01/24/2017 1147   GFRNONAA 44 (L) 07/23/2015 1513   GFRAA 51 (L) 07/23/2015 1513    No results found for: SPEP, UPEP  Lab Results  Component Value Date   WBC 5.7 01/24/2017   NEUTROABS 4.4 01/24/2017   HGB 13.3 01/24/2017   HCT 41.3 01/24/2017   MCV 91.3 01/24/2017   PLT 179 01/24/2017      Chemistry      Component Value Date/Time   NA 141 01/24/2017 1147   K 4.1 01/24/2017 1147   CL 107 07/23/2015 1513   CO2 27 01/24/2017 1147   BUN 17.8 01/24/2017 1147   CREATININE 1.8 (H) 01/24/2017 1147      Component Value Date/Time   CALCIUM 9.4 01/24/2017 1147   ALKPHOS 57 01/24/2017 1147   AST 22 01/24/2017 1147   ALT 15 01/24/2017 1147   BILITOT 0.60 01/24/2017 1147      ASSESSMENT & PLAN:  Tonsillar cancer The patient has complete response on recent imaging study in May 2018 He will continue treatment indefinitely I do not plan to repeat imaging study again until the end of the year, due around November 2018  Cancer associated pain The patient has cancer associated pain. His pain is intermittent in nature and I  reassured the patient it is not due to cancer progression I recommend we continue the dose of oxycodone to 15 mg every 8 hours as needed for pain  Poor dentition He has extremely poor dentition that appears worse He has seen a recent dentist who recommended  complete dental extraction I have provided him a letter of clearance from the oncology standpoint I recommend additional input from Dr. Tommie Raymond before he proceed with dental extraction due to risk of osteonecrosis or poor wound healing due to radiation exposure   Chronic kidney disease (CKD), stage II (mild) His kidney function is stable. As long as his creatinine clearance remain greater than 30, we will continue chemotherapy Monitor only   No orders of the defined types were placed in this encounter.  All questions were answered. The patient knows to call the clinic with any problems, questions or concerns. No barriers to learning was detected. I spent 15 minutes counseling the patient face to face. The total time spent in the appointment was 20 minutes and more than 50% was on counseling and review of test results     [name], MD 01/24/2017 12:45 PM

## 2017-01-25 ENCOUNTER — Telehealth: Payer: Self-pay | Admitting: *Deleted

## 2017-01-25 MED FILL — LEVOTHYROXINE 25 MCG TABLET: 25 | 30 days supply | Qty: 30 | Fill #1

## 2017-01-25 NOTE — Telephone Encounter (Signed)
LM to call Dr Alvy Bimler office with the name of his dentist

## 2017-01-25 NOTE — Telephone Encounter (Signed)
-----   Message from [name], MD sent at 01/25/2017  8:42 AM EDT ----- Regarding: dentist info Need his dentist info

## 2017-02-01 ENCOUNTER — Telehealth: Payer: Self-pay | Admitting: *Deleted

## 2017-02-01 NOTE — Telephone Encounter (Signed)
Mason [name] states he has his insurance straight.  He will see his dentist, Dr Cathie Olden on Monday. Dr Cathie Olden and Dr Tommie Raymond have spoken. Dr Enrique Sack is going to do his surgery.

## 2017-02-07 ENCOUNTER — Telehealth: Payer: Self-pay

## 2017-02-07 NOTE — Telephone Encounter (Signed)
Pt saw dentist name Edwin Cap on [facility]. Dr Quincy Simmonds agrees that his teeth all need to come out. Pt s/w Dr Enrique Sack who will his teeth out but he would need another dentist to fit his dentures. Dr Quincy Simmonds gave him a couple of other oral surgeons who could do the teeth extraction and also do the dentures. He is asking Dr Alvy Bimler opinion on who to see. He will call back tomorrow with the names. He does not have mychart.

## 2017-02-07 NOTE — Telephone Encounter (Signed)
Hi Ron,  Can you coordinate the surgery & fitting?

## 2017-02-09 ENCOUNTER — Ambulatory Visit (HOSPITAL_COMMUNITY): Payer: Medicaid - Dental | Admitting: Dentistry

## 2017-02-09 ENCOUNTER — Encounter (INDEPENDENT_AMBULATORY_CARE_PROVIDER_SITE_OTHER): Payer: Self-pay

## 2017-02-09 ENCOUNTER — Encounter (HOSPITAL_COMMUNITY): Payer: Self-pay | Admitting: Dentistry

## 2017-02-09 VITALS — BP 135/83 | HR 82 | Temp 98.1°F

## 2017-02-09 DIAGNOSIS — M264 Malocclusion, unspecified: Secondary | ICD-10-CM

## 2017-02-09 DIAGNOSIS — M272 Inflammatory conditions of jaws: Secondary | ICD-10-CM

## 2017-02-09 DIAGNOSIS — C099 Malignant neoplasm of tonsil, unspecified: Secondary | ICD-10-CM

## 2017-02-09 DIAGNOSIS — K036 Deposits [accretions] on teeth: Secondary | ICD-10-CM

## 2017-02-09 DIAGNOSIS — R682 Dry mouth, unspecified: Secondary | ICD-10-CM

## 2017-02-09 DIAGNOSIS — K045 Chronic apical periodontitis: Secondary | ICD-10-CM

## 2017-02-09 DIAGNOSIS — K083 Retained dental root: Secondary | ICD-10-CM

## 2017-02-09 DIAGNOSIS — Z5189 Encounter for other specified aftercare: Secondary | ICD-10-CM

## 2017-02-09 DIAGNOSIS — K0530 Chronic periodontitis, unspecified: Secondary | ICD-10-CM

## 2017-02-09 DIAGNOSIS — K06020 Generalized gingival recession, unspecified: Secondary | ICD-10-CM

## 2017-02-09 DIAGNOSIS — K029 Dental caries, unspecified: Secondary | ICD-10-CM

## 2017-02-09 DIAGNOSIS — K117 Disturbances of salivary secretion: Secondary | ICD-10-CM

## 2017-02-09 DIAGNOSIS — K08409 Partial loss of teeth, unspecified cause, unspecified class: Secondary | ICD-10-CM

## 2017-02-09 DIAGNOSIS — Y842 Radiological procedure and radiotherapy as the cause of abnormal reaction of the patient, or of later complication, without mention of misadventure at the time of the procedure: Secondary | ICD-10-CM

## 2017-02-09 DIAGNOSIS — Z9221 Personal history of antineoplastic chemotherapy: Secondary | ICD-10-CM

## 2017-02-09 DIAGNOSIS — Z923 Personal history of irradiation: Secondary | ICD-10-CM

## 2017-02-09 MED ORDER — CHLORHEXIDINE GLUCONATE 0.12 % MT SOLN: OROMUCOSAL | 99 refills | Status: DC

## 2017-02-09 NOTE — Patient Instructions (Signed)
Patient is being referred to an oral surgeon concerning discussion of plan of care and coordination of possible hyperbaric oxygen therapy. In the meantime, the patient is to use chlorhexidine rinses twice daily as instructed. Patient is to rinse with 15 ML's twice daily in a swish and spit manner.  Prescription was sent to [place] [facility] with refills for one year. Patient to call problems arise before consultation with oral surgeon.  Dr. Enrique Sack

## 2017-02-09 NOTE — Progress Notes (Signed)
DENTAL CONSULTATION  Date of Consultation:  02/09/2017 Patient Name:   Mason [name] Date of Birth:   May 29, 1965 Medical Record Number: [id number]  VITALS: BP 135/83 (BP Location: Left Arm)   Pulse 82   Temp 98.1 F (36.7 C) (Oral)   CHIEF COMPLAINT: Patient referred by Dr. Alvy Bimler for evaluation of poor dentition.    HPI: Mason [name] is a 51 year old male with history of squamous cell carcinoma of the right tonsil. Patient underwent chemoradiation therapy that was completed from 08/19/2013 through 10/15/2013. Patient with recurrence noted on 12/25/2014. The patient then received chemotherapy from 02/05/2015 through 07/13/2015 with carboplatin Raiford Noble. Patient with persistent disease noted and antibody with Keytruda that was started on 08/17/2015. Patient remains on Keytruda at this time under the care of Dr. Alvy Bimler. Patient now referred for evaluation of poor dentition.  Patient currently denies acute toothaches, swellings, or abscesses. Patient was recently seen by Dr. Edwin Cap for evaluation of poor dentition and discussion of various treatment options. After discussion of the risks, benefits, and complications of various treatment options, the patient wished to proceed with extraction of remaining teeth with alveoloplasty with subsequent fabrication of upper lower complete dentures after adequate healing.  Dr. Cathie Olden offered referral to an oral surgeon for the extractions and another general dentist for fabrication of upper lower complete dentures. Patient then contacted Dental Medicine for possible evaluation for extraction remaining teeth with alveoloplasty in the operating room with general anesthesia. The patient is now seen for this consultation and will be referred to the general dentist of his choice for fabrication of upper lower complete dentures after adequate healing. The patient does have some generalized anxiety and history of dental phobia.    PROBLEM LIST: Patient  Active Problem List   Diagnosis Date Noted  . Acquired hypothyroidism 09/06/2016  . Encounter for antineoplastic chemotherapy 08/09/2016  . Marijuana abuse 08/09/2016  . Debility 07/15/2016  . Cough 06/17/2016  . Nasal congestion 06/17/2016  . Elevated BP without diagnosis of hypertension 06/17/2016  . Poor dentition 05/20/2016  . Pneumonitis 02/29/2016  . Sinus congestion 02/08/2016  . Weight loss 01/18/2016  . Open neck wound 11/16/2015  . 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 Dysuria 09/04/2015  . Palliative care by specialist 08/05/2015  . Insomnia 06/15/2015  . Acute gout involving toe of right foot 05/25/2015  . Acute sinusitis, unspecified 05/18/2015  . Pancytopenia due to antineoplastic chemotherapy ([place]) 04/27/2015  . Primary cancer of tonsillar fossa ([place]) 03/20/2015  . Cancer associated pain 02/12/2015  . Lung nodule, multiple 12/30/2014  . 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  . Chronic kidney disease (CKD), stage II (mild) 12/02/2014  . Abdominal wall pain in left upper quadrant 09/30/2014  . Acquired dysphasia 07/30/2014  . Neuropathy due to chemotherapeutic drug ([place]) 07/30/2014  . Lymphedema of face 06/27/2014  . Dysphagia, oropharyngeal 06/27/2014  . Neck stiffness 06/27/2014  . Throat pain in adult 02/19/2014  . Anemia in neoplastic disease 10/14/2013  . Leukopenia due to antineoplastic chemotherapy ([place]) 10/14/2013  . Prerenal renal failure 10/14/2013  . Nausea & vomiting 09/12/2013  . Chronic periodontitis 08/01/2013  . Tonsillar cancer ([place]) 07/16/2013    PMH: Past Medical History:  Diagnosis Date  . Abnormal liver function test   . Acute sinusitis, unspecified 05/18/2015  . Anemia   . Anxiety    mild new dx  . Arthritis    knees,hips  . Bilateral edema of lower extremity   . Complication of  anesthesia    Pt stated " my oxygen level was slow in rising."  . Concussion    Hx: in high school x 2  . Constipation   . Dysuria  09/04/2015  . Fever   . Hyperactive gag reflex   . Hypertension   . Hypoglycemia   . Hypokalemia   . Insomnia 06/15/2015  . Knee pain, chronic   . Malnutrition ([place])   . Non-healing surgical wound 05/23/2014  . PEG (percutaneous endoscopic gastrostomy) status ([place])   . Renal failure, acute ([place])   . S/P radiation therapy 08/19/2013-10/15/2013   Right Tonstil and bilateral neck / 70 Gy in 35 fractions to gross disease, 63 Gy in 35 fractions to high risk nodal echelons, and 56 Gy in 35 fractions to intermediate risk nodal echelons  . Severe nausea and vomiting   . Status post chemotherapy    Only received 2 doses due to uncontrolled nausea and acute renal failure  . Tonsillar cancer ([place]) 07/09/13   SCCA of Right Tonsil, recurrent 2016    PSH: Past Surgical History:  Procedure Laterality Date  . LAPAROSCOPIC GASTROSTOMY N/A 08/15/2013   Procedure: LAPAROSCOPIC GASTROSTOMY TUBE PLACEMENT;  Surgeon: [name], MD;  Location: [facility];  Service: General;  Laterality: N/A;  . LYMPH NODE BIOPSY  03/20/14   right neck  . MULTIPLE EXTRACTIONS WITH ALVEOLOPLASTY N/A 08/01/2013   Procedure: Extraction of tooth #'s 1,15,17,31, 32 with alveoloplasty, mandibular left torus reduction, and gross debridement of remaining teeth.;  Surgeon: [name], DDS;  Location: [facility];  Service: Oral Surgery;  Laterality: N/A;  . PORT-A-CATH REMOVAL N/A 05/13/2014   Procedure: REMOVAL of PORT-A-CATH;  Surgeon: [name], MD;  Location: [facility];  Service: General;  Laterality: N/A;  . PORTACATH PLACEMENT Left 08/15/2013   Procedure: INSERTION PORT-A-CATH;  Surgeon: [name], MD;  Location: [facility];  Service: General;  Laterality: Left;    ALLERGIES: Allergies  Allergen Reactions  . Pollen Extract Other (See Comments)    Watery eyes, runny nose, sneezing    MEDICATIONS: Current Outpatient Prescriptions  Medication Sig Dispense Refill  . fluticasone (FLONASE) 50 MCG/ACT nasal spray Place 2 sprays  into both nostrils daily. 16 g 2  . levothyroxine (SYNTHROID, LEVOTHROID) 25 MCG tablet Take 1 tablet (25 mcg total) by mouth daily before breakfast. 30 tablet 1  . lidocaine-prilocaine (EMLA) cream Apply 1 application topically as needed. 30 g 6  . Multiple Vitamin (MULTIVITAMIN WITH MINERALS) TABS tablet Take 1 tablet by mouth daily.    Marland Kitchen omeprazole (PRILOSEC) 20 MG capsule Take 1 capsule (20 mg total) by mouth daily. 30 capsule 0  . oxyCODONE (ROXICODONE) 15 MG immediate release tablet Take 1 tablet (15 mg total) by mouth every 8 (eight) hours as needed. 90 tablet 0  . traZODone (DESYREL) 150 MG tablet Take 1 tablet (150 mg total) by mouth at bedtime. 30 tablet 11   No current facility-administered medications for this visit.    Facility-Administered Medications Ordered in Other Visits  Medication Dose Route Frequency Provider Last Rate Last Dose  . sodium chloride 0.9 % injection 10 mL  10 mL Intravenous PRN [name], MD   10 mL at 11/29/16 0818    LABS: Lab Results  Component Value Date   WBC 5.7 01/24/2017   HGB 13.3 01/24/2017   HCT 41.3 01/24/2017   MCV 91.3 01/24/2017   PLT 179 01/24/2017      Component Value Date/Time   NA 141 01/24/2017 1147  K 4.1 01/24/2017 1147   CL 107 07/23/2015 1513   CO2 27 01/24/2017 1147   GLUCOSE 85 01/24/2017 1147   BUN 17.8 01/24/2017 1147   CREATININE 1.8 (H) 01/24/2017 1147   CALCIUM 9.4 01/24/2017 1147   GFRNONAA 44 (L) 07/23/2015 1513   GFRAA 51 (L) 07/23/2015 1513   Lab Results  Component Value Date   INR 0.97 02/09/2015   INR 0.94 08/06/2013   No results found for: PTT  SOCIAL HISTORY: Social History   Social History  . Marital status: Single    Spouse name: N/A  . Number of children: 1  . Years of education: N/A   Occupational History  . Not on file.   Social History Main Topics  . Smoking status: Former Smoker    Packs/day: 1.00    Years: 20.00    Types: Cigarettes    Quit date: 07/16/2003  . Smokeless  tobacco: Never Used  . Alcohol use No     Comment: none years ago  . Drug use: Yes    Types: Marijuana, Benzodiazepines     Comment: years ago  . Sexual activity: No   Other Topics Concern  . Not on file   Social History Narrative  . No narrative on file    FAMILY HISTORY: Family History  Problem Relation Age of Onset  . Arthritis Mother   . Arthritis Father   . CVA Unknown   . Diabetes Unknown   . Heart attack Unknown     REVIEW OF SYSTEMS: Reviewed with the patient as per History of present illness. Psych: Patient does have a history of generalized anxiety and dental phobia.  DENTAL HISTORY: CHIEF COMPLAINT: Patient referred by Dr. Alvy Bimler for evaluation of poor dentition.    HPI: JERELL DEMERY is a 51 year old male with history of squamous cell carcinoma of the right tonsil. Patient underwent chemoradiation therapy that was completed from 08/19/2013 through 10/15/2013. Patient with recurrence noted on 12/25/2014. The patient then received chemotherapy from 02/05/2015 through 07/13/2015 with carboplatin Raiford Noble. Patient with persistent disease noted and antibody with Keytruda that was started on 08/17/2015. Patient remains on Keytruda at this time under the care of Dr. Alvy Bimler. Patient now referred for evaluation of poor dentition.  Patient currently denies acute toothaches, swellings, or abscesses. Patient was recently seen by Dr. Edwin Cap for evaluation of poor dentition and discussion of various treatment options. After discussion of the risks, benefits, and complications of various treatment options, the patient wished to proceed with extraction of remaining teeth with alveoloplasty with subsequent fabrication of upper lower complete dentures after adequate healing.  Dr. Cathie Olden offered referral to an oral surgeon for the extractions and another general dentist for fabrication of upper lower complete dentures. Patient then contacted Dental Medicine for possible evaluation  for extraction remaining teeth with alveoloplasty in the operating room with general anesthesia. The patient is now seen for this consultation and will be referred to the general dentist of his choice for fabrication of upper lower complete dentures after adequate healing. The patient does have some generalized anxiety and history of dental phobia.  DENTAL EXAMINATION: GENERAL: The patient is a tall, well-developed, well-nourished male in no acute distress. HEAD AND NECK: The patient has a scar in the right neck from previous neck surgery. There is no left neck lymphadenopathy. The patient denies acute TMJ symptoms. Patient has a maximum interincisal opening of 50 mm. INTRAORAL EXAM: The patient has xerostomia. There is an area of exposed bone  in the area of the alveolar ridge area #32 that measures 3 x 3 mm consistent with osteoradionecrosis. Patient has a fistulous tract in the area between tooth numbers 24 and 25 on the facial aspect. DENTITION: Patient is missing tooth numbers 1, 2, 3, 12, 14 through 19, and 29-32. There are retained roots in the area of tooth numbers 25 and 26. PERIODONTAL: The patient has chronic periodontitis with plaque and calculus accumulations, gingival recession, and incipient tooth mobility. There is incipient to moderate bone loss noted. DENTAL CARIES/SUBOPTIMAL RESTORATIONS: Patient has rampant dental caries affecting all remaining dentition. ENDODONTIC: Patient currently denies acute pulpitis symptoms. The patient does have incipient periapical radiolucency at the apex of tooth numbers 13, 20, 25, and 26. CROWN AND BRIDGE: There are no crown or bridge restorations. PROSTHODONTIC: Patient currently has no partial dentures. OCCLUSION: Patient has a poor occlusal scheme secondary to multiple missing teeth, multiple retained root segments, and lack of replacement of missing teeth with dental prostheses.  RADIOGRAPHIC INTERPRETATION: An orthopantogram was taken. 9  periapical radiographs and 2 bite wings were obtained from Dr. Quincy Simmonds. There are multiple missing teeth. There are rampant dental caries noted. There multiple areas of periapical pathology and radiolucency. There is incipient to moderate bone loss. Multiple diastemas are noted. There is a radiolucent area near the alveolar ridge in the area of #32 consistent with osteoradionecrosis.   ASSESSMENTS: 1. 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 Retained root segments 6. Rampant dental caries 7. Chronic periodontitis of bone loss 8. Gingival recession 9. Accretions 10. Incipient tooth mobility 11. Multiple missing teeth 12. Multiple diastemas 13. Poor occlusal scheme 14. Risk for additional osteoradionecrosis and anticipated full mouth extractions with alveoloplasty. 15.  Need for evaluation for hyperbaric oxygen therapy for treatment of current osteoradionecrosis and anticipated full mouth extractions with alveoloplasty.   PLAN/RECOMMENDATIONS: 1. 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 We discussed various treatment options to include no treatment, multiple extractions with alveoloplasty, pre-prosthetic surgery as indicated, periodontal therapy, dental restorations, root canal therapy, crown and bridge therapy, implant therapy, and replacement of missing teeth as indicated. In light of the complexity of the medical and dental conditions and consideration for additional surgical intervention in the area of #32, I recommend that the patient be referred to an oral surgeon for dental consultation and consideration of preoperative and postoperative  hyperbaric oxygen therapy.  The patient does agree to proceed with oral surgery consultation and hyperbaric oxygen therapy consultation as indicated. I will  also need to discuss the anticipated plan of care with Dr. Alvy Bimler at this time to ensure the ability to proceed with hyperbaric oxygen therapy with the persistent squamous cell carcinoma.  After surgical procedures and adequate healing, the patient will follow-up with a dentist of his choice for fabrication of upper and lower complete dentures. In the meantime, the patient will be prescribe chlorhexidine rinses to be used twice daily in a swish and spit manner. Refills for one year were provided and a prescription was sent to [facility] outpatient pharmacy.   2. Discussion of findings with medical team and coordination of future medical and dental care as needed.  I spent in excess of  90 minutes during the conduct of this consultation  and >50% of this time involved direct face-to-face encounter for counseling and/or coordination of the patient's care.    [name], DDS

## 2017-02-10 ENCOUNTER — Telehealth: Payer: Self-pay | Admitting: *Deleted

## 2017-02-10 NOTE — Telephone Encounter (Signed)
"  Let Dr. Alvy Bimler know all my dental stuff has been taken care of.  She can set appointments for me to see her and begin infusions."  Routing call information to collaborative nurse and provider for review.  Further patient communication through collaborative nurse.

## 2017-02-13 ENCOUNTER — Telehealth: Payer: Self-pay

## 2017-02-13 NOTE — Telephone Encounter (Signed)
Pt called to get appt.for MD and infusion.  Called him back to acknowledge receipt of call. He has an appt with Dr Enrique Sack on Nov. 13 for dental extraction.

## 2017-02-14 ENCOUNTER — Other Ambulatory Visit: Payer: Self-pay | Admitting: Hematology and Oncology

## 2017-02-14 NOTE — Telephone Encounter (Signed)
lvm Dr Alvy Bimler working on appt for next week

## 2017-02-14 NOTE — Telephone Encounter (Signed)
I will schedule infusion treatment for next week. Scheduling msg sent

## 2017-02-18 ENCOUNTER — Telehealth: Payer: Self-pay

## 2017-02-18 NOTE — Telephone Encounter (Signed)
Spoke with patient and he is aware of his appts per 10/31 inbasket

## 2017-02-22 ENCOUNTER — Ambulatory Visit (HOSPITAL_BASED_OUTPATIENT_CLINIC_OR_DEPARTMENT_OTHER): Payer: Medicaid Other | Admitting: Hematology and Oncology

## 2017-02-22 ENCOUNTER — Encounter: Payer: Self-pay | Admitting: Hematology and Oncology

## 2017-02-22 ENCOUNTER — Other Ambulatory Visit: Payer: Self-pay | Admitting: Hematology and Oncology

## 2017-02-22 ENCOUNTER — Ambulatory Visit: Payer: Medicaid Other

## 2017-02-22 ENCOUNTER — Other Ambulatory Visit (HOSPITAL_BASED_OUTPATIENT_CLINIC_OR_DEPARTMENT_OTHER): Payer: Medicaid Other

## 2017-02-22 ENCOUNTER — Ambulatory Visit (HOSPITAL_BASED_OUTPATIENT_CLINIC_OR_DEPARTMENT_OTHER): Payer: Medicaid Other

## 2017-02-22 ENCOUNTER — Telehealth: Payer: Self-pay | Admitting: Hematology and Oncology

## 2017-02-22 DIAGNOSIS — K089 Disorder of teeth and supporting structures, unspecified: Secondary | ICD-10-CM | POA: Diagnosis not present

## 2017-02-22 DIAGNOSIS — G893 Neoplasm related pain (acute) (chronic): Secondary | ICD-10-CM

## 2017-02-22 DIAGNOSIS — Z5111 Encounter for antineoplastic chemotherapy: Secondary | ICD-10-CM

## 2017-02-22 DIAGNOSIS — C099 Malignant neoplasm of tonsil, unspecified: Secondary | ICD-10-CM

## 2017-02-22 DIAGNOSIS — Z5112 Encounter for antineoplastic immunotherapy: Secondary | ICD-10-CM | POA: Diagnosis not present

## 2017-02-22 DIAGNOSIS — N182 Chronic kidney disease, stage 2 (mild): Secondary | ICD-10-CM

## 2017-02-22 DIAGNOSIS — Z79899 Other long term (current) drug therapy: Secondary | ICD-10-CM | POA: Diagnosis not present

## 2017-02-22 LAB — CBC WITH DIFFERENTIAL/PLATELET
BASO%: 0.7 % (ref 0.0–2.0)
Basophils Absolute: 0 10*3/uL (ref 0.0–0.1)
EOS ABS: 0.2 10*3/uL (ref 0.0–0.5)
EOS%: 3.4 % (ref 0.0–7.0)
HCT: 40.4 % (ref 38.4–49.9)
HGB: 13.1 g/dL (ref 13.0–17.1)
LYMPH%: 12.8 % — AB (ref 14.0–49.0)
MCH: 29.6 pg (ref 27.2–33.4)
MCHC: 32.3 g/dL (ref 32.0–36.0)
MCV: 91.5 fL (ref 79.3–98.0)
MONO#: 0.5 10*3/uL (ref 0.1–0.9)
MONO%: 7.5 % (ref 0.0–14.0)
NEUT%: 75.6 % — AB (ref 39.0–75.0)
NEUTROS ABS: 4.7 10*3/uL (ref 1.5–6.5)
PLATELETS: 183 10*3/uL (ref 140–400)
RBC: 4.42 10*6/uL (ref 4.20–5.82)
RDW: 14.8 % — ABNORMAL HIGH (ref 11.0–14.6)
WBC: 6.2 10*3/uL (ref 4.0–10.3)
lymph#: 0.8 10*3/uL — ABNORMAL LOW (ref 0.9–3.3)

## 2017-02-22 LAB — COMPREHENSIVE METABOLIC PANEL
ALBUMIN: 3.9 g/dL (ref 3.5–5.0)
ALK PHOS: 61 U/L (ref 40–150)
ALT: 19 U/L (ref 0–55)
AST: 23 U/L (ref 5–34)
Anion Gap: 8 mEq/L (ref 3–11)
BUN: 14.5 mg/dL (ref 7.0–26.0)
CALCIUM: 9.5 mg/dL (ref 8.4–10.4)
CO2: 26 mEq/L (ref 22–29)
Chloride: 107 mEq/L (ref 98–109)
Creatinine: 1.7 mg/dL — ABNORMAL HIGH (ref 0.7–1.3)
EGFR: 55 mL/min/{1.73_m2} — ABNORMAL LOW (ref >60)
Glucose: 86 mg/dl (ref 70–140)
POTASSIUM: 4.2 meq/L (ref 3.5–5.1)
Sodium: 141 mEq/L (ref 136–145)
Total Bilirubin: 0.36 mg/dL (ref 0.20–1.20)
Total Protein: 7.1 g/dL (ref 6.4–8.3)

## 2017-02-22 LAB — TSH: TSH: 3.993 m(IU)/L (ref 0.320–4.118)

## 2017-02-22 MED ORDER — SODIUM CHLORIDE 0.9 % IV SOLN
200.0000 mg | Freq: Once | INTRAVENOUS | Status: AC
  Administered 2017-02-22: 200 mg via INTRAVENOUS
  Filled 2017-02-22: qty 8

## 2017-02-22 MED ORDER — HEPARIN SOD (PORK) LOCK FLUSH 100 UNIT/ML IV SOLN
500.0000 [IU] | Freq: Once | INTRAVENOUS | Status: AC | PRN
  Administered 2017-02-22: 500 [IU] via INTRAVENOUS
  Filled 2017-02-22: qty 5

## 2017-02-22 MED ORDER — SODIUM CHLORIDE 0.9% FLUSH
10.0000 mL | INTRAVENOUS | Status: DC | PRN
  Administered 2017-02-22: 10 mL
  Filled 2017-02-22: qty 10

## 2017-02-22 MED ORDER — SODIUM CHLORIDE 0.9 % IJ SOLN
10.0000 mL | INTRAMUSCULAR | Status: DC | PRN
  Administered 2017-02-22: 10 mL via INTRAVENOUS
  Filled 2017-02-22: qty 10

## 2017-02-22 MED ORDER — HEPARIN SOD (PORK) LOCK FLUSH 100 UNIT/ML IV SOLN
500.0000 [IU] | Freq: Once | INTRAVENOUS | Status: AC | PRN
  Administered 2017-02-22: 500 [IU]
  Filled 2017-02-22: qty 5

## 2017-02-22 MED ORDER — SODIUM CHLORIDE 0.9 % IV SOLN
Freq: Once | INTRAVENOUS | Status: AC
  Administered 2017-02-22: 11:00:00 via INTRAVENOUS

## 2017-02-22 MED ORDER — OXYCODONE HCL 15 MG PO TABS: 15.0000 mg | ORAL_TABLET | Freq: Three times a day (TID) | ORAL | 0 refills | Status: DC | PRN

## 2017-02-22 MED FILL — oxyCODONE HCL 15 MG TABS: 15 | 30 days supply | Qty: 90 | Fill #0

## 2017-02-22 NOTE — Progress Notes (Signed)
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 FNA of right level III lymph node was inconclusive for cancer      07/25/2013 Imaging    PET scan showed locally advanced disease with abnormal lymphadenopathy in the right axilla      08/06/2013 Procedure    CT-guided biopsy of the lymphadenopathy was negative for malignancy      08/15/2013 Surgery    Patient has placement of port and feeding tube      08/19/2013 - 09/10/2013 Chemotherapy    Patient received chemotherapy with cisplatin. The patient only received 2 doses due to uncontrolled nausea and acute renal failure.      08/19/2013 - 10/15/2013 Radiation Therapy    Received Helical IMRT Tomotherapy:  Right Tonstil and bilateral neck / 70 Gy in 35 fractions to gross disease, 63 Gy in 35 fractions to high risk nodal echelons, and 56 Gy in 35 fractions to intermediate risk nodal echelons.      08/27/2013 - 08/30/2013 Hospital Admission    The patient was admitted to the hospital for uncontrolled nausea vomiting and dehydration.      02/14/2014 Imaging    PET/CT scan showed complete response to treatment      03/19/2014 Surgery    He had  excisional lymph node biopsy from the right neck. Pathology was benign      05/13/2014 Surgery    He had removal of Port-A-Cath.      05/22/2014 Imaging    Repeat CT scan of the neck show no evidence of disease recurrence.      12/09/2014 Imaging    Ct neck without contrast showed persistent abnormalities on the right side of neck, indeterminate      12/25/2014 Imaging    PET CT scan showed disease recurrence.      02/10/2015 Procedure    He has placement of port      02/13/2015 - 07/13/2015 Chemotherapy    He received chemotherapy with carbo/Taxol      04/21/2015 Imaging    PET CT scan showed improved disease control      08/04/2015 Imaging    PET scan showed persistent disease      08/17/2015 -  Chemotherapy    He was started with [facility]      10/19/2015 Imaging    Ct neck showed mass-like intermediate density soft tissue at the right lateral neck recurrence site stable.       02/26/2016 Imaging    CT neck showed unchanged appearance of right neck recurrence compared to 10/19/2015 CT. No noncontrast evidence of new metastatic disease in the neck. Chronic sinusitis, progressed.      02/26/2016 Imaging    Diffuse but  patchy and asymmetric partial airspace filling process (ground-glass opacity) in the lungs. This could be due to respiratory bronchiolitis, hypersensitivity pneumonitis or possible drug reaction. Atypical/viral pneumonia is also possible. Pulmonary consultation may be a helpful. A three-month follow-up noncontrast chest CT is suggested. Slight interval enlargement of mediastinal lymph nodes and a small lymph node along the left major fissure. This is most likely due to the inflammatory process in the lungs. No findings for metastatic disease involving the chest. No findings for upper abdominal metastatic disease.      02/29/2016 Adverse Reaction    His treatment is placed on hold due to possible hypersensitivity pneumonitis/drug reaction      04/14/2016 Imaging     Ct chest showed no evidence for metastatic disease within the chest. Significant interval improvement and near complete resolution of previously described diffuse bilateral predominately ground-glass pulmonary opacities, most compatible with resolving infectious/inflammatory process.      09/05/2016 Imaging    CT scan of neck and chest  1. Unchanged appearance of right neck recurrence. 2. No evidence of new metastatic disease in the neck. 3. Unremarkable and stable CT appearance of the chest. No findings suspicious for metastatic disease       INTERVAL HISTORY: Please see below for problem oriented charting. He returns for further follow-up He continues to have jaw pain from poor dentition Denies recent nausea or vomiting No recent cough He had multiple evaluation with dental surgeon for possible complete dental extraction in the near future His chronic pain is about the same REVIEW OF SYSTEMS:   Constitutional: Denies fevers, chills or abnormal weight loss Eyes: Denies blurriness of vision Ears, nose, mouth, throat, and face: Denies mucositis or sore throat Respiratory: Denies cough, dyspnea or wheezes Cardiovascular: Denies palpitation, chest discomfort or lower extremity swelling Gastrointestinal:  Denies nausea, heartburn or change in bowel habits Skin: Denies abnormal skin rashes Lymphatics: Denies new lymphadenopathy or easy bruising Neurological:Denies numbness, tingling or new weaknesses Behavioral/Psych: Mood is stable, no new changes  All other systems were reviewed with the patient and are negative.  I have reviewed the past medical history, past surgical history, social history and family history with the patient and they are unchanged from previous note.  ALLERGIES:  is allergic to pollen extract.  MEDICATIONS:  Current Outpatient Medications  Medication Sig Dispense Refill  . chlorhexidine (PERIDEX) 0.12 % solution Rinse with 15 mls twice daily for 30 seconds.  Use after breakfast and at bedtime. Spit out excess. Do not swallow. 960 mL prn  . fluticasone (FLONASE) 50 MCG/ACT nasal spray Place 2 sprays into both nostrils daily. 16 g 2  . levothyroxine (SYNTHROID, LEVOTHROID) 25 MCG tablet Take 1 tablet (25 mcg total) by mouth daily before breakfast. 30 tablet 1  . lidocaine-prilocaine (EMLA) cream Apply 1 application topically as needed. 30 g 6  . Multiple Vitamin (MULTIVITAMIN WITH MINERALS) TABS tablet Take 1 tablet by mouth daily.    Marland Kitchen omeprazole (PRILOSEC) 20 MG capsule Take 1 capsule (20 mg total) by mouth daily. 30 capsule 0  . oxyCODONE (ROXICODONE) 15 MG immediate release tablet Take 1 tablet (15 mg total) every 8 (eight) hours as needed by mouth. 90 tablet 0  . traZODone (DESYREL) 150 MG tablet Take 1 tablet (150 mg total) by mouth at bedtime. 30 tablet 11   No current facility-administered medications for this visit.    Facility-Administered Medications Ordered in Other Visits  Medication Dose Route Frequency Provider Last Rate Last Dose  . 0.9 %  sodium chloride infusion   Intravenous Once Alvy Bimler, Dominga Mcduffie, MD      . heparin lock flush 100 unit/mL  500 Units Intracatheter Once PRN Alvy Bimler, Ryna Beckstrom, MD      . pembrolizumab (KEYTRUDA) 200 mg in sodium chloride 0.9 % 50 mL chemo infusion  200 mg Intravenous Once Neidy Guerrieri, MD      . sodium chloride 0.9 % injection 10 mL  10 mL Intravenous PRN Alvy Bimler, Jennae Hakeem, MD   10 mL at 11/29/16 0818  . sodium chloride flush (NS) 0.9 % injection 10 mL  10 mL Intracatheter PRN Alvy Bimler, Timothy Townsel, MD        PHYSICAL EXAMINATION: ECOG PERFORMANCE STATUS: 1 - Symptomatic but completely ambulatory  Vitals:   02/22/17 0945  BP: 139/84  Pulse: 82  Resp: 20  Temp: 98.5 F (36.9 C)  SpO2: 100%   Filed Weights   02/22/17 0945  Weight: 286 lb 3.2 oz (129.8 kg)    GENERAL:alert, no distress and comfortable SKIN: skin color, texture, turgor are normal, no rashes or significant lesions EYES: normal, Conjunctiva are pink and  non-injected, sclera clear OROPHARYNX:no exudate, no erythema and lips, buccal mucosa, and tongue normal.  Poor dentition is noted NECK: well-healed surgical scar. LYMPH:  no palpable lymphadenopathy in the cervical, axillary or inguinal LUNGS: clear to auscultation and percussion with normal breathing effort HEART: regular rate & rhythm and no murmurs and no lower extremity edema ABDOMEN:abdomen soft, non-tender and normal bowel sounds Musculoskeletal:no cyanosis of digits and no clubbing  NEURO: alert & oriented x 3 with fluent speech, no focal motor/sensory deficits  LABORATORY DATA:  I have reviewed the data as listed    Component Value Date/Time   NA 141 02/22/2017 0857   K 4.2 02/22/2017 0857   CL 107 07/23/2015 1513   CO2 26 02/22/2017 0857   GLUCOSE 86 02/22/2017 0857   BUN 14.5 02/22/2017 0857   CREATININE 1.7 (H) 02/22/2017 0857   CALCIUM 9.5 02/22/2017 0857   PROT 7.1 02/22/2017 0857   ALBUMIN 3.9 02/22/2017 0857   AST 23 02/22/2017 0857   ALT 19 02/22/2017 0857   ALKPHOS 61 02/22/2017 0857   BILITOT 0.36 02/22/2017 0857   GFRNONAA 44 (L) 07/23/2015 1513   GFRAA 51 (L) 07/23/2015 1513    No results found for: SPEP, UPEP  Lab Results  Component Value Date   WBC 6.2 02/22/2017   NEUTROABS 4.7 02/22/2017   HGB 13.1 02/22/2017   HCT 40.4 02/22/2017   MCV 91.5 02/22/2017   PLT 183 02/22/2017      Chemistry      Component Value Date/Time   NA 141 02/22/2017 0857   K 4.2 02/22/2017 0857   CL 107 07/23/2015 1513   CO2 26 02/22/2017 0857   BUN 14.5 02/22/2017 0857   CREATININE 1.7 (H) 02/22/2017 0857      Component Value Date/Time   CALCIUM 9.5 02/22/2017 0857   ALKPHOS 61 02/22/2017 0857   AST 23 02/22/2017 0857   ALT 19 02/22/2017 0857   BILITOT 0.36 02/22/2017 0857      ASSESSMENT & PLAN:  Tonsillar cancer The patient has complete response on recent imaging study in May 2018 He will continue treatment indefinitely I do not plan to repeat imaging  study again until the end of the year, due end of the year  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 His pain is intermittent in nature and I reassured the patient it is not due to  cancer progression I recommend we continue the dose of oxycodone to 15 mg every 8 hours as needed for pain  Chronic kidney disease (CKD), stage II (mild) His kidney function is stable. As long as his creatinine clearance remain greater than 30, we will continue chemotherapy Monitor only  Poor dentition He has extremely poor dentition that appears worse He has seen a recent dentist who recommended complete dental extraction I have provided him a letter of clearance from the oncology standpoint There is no contraindication for him to proceed with dental extraction, hyperbaric oxygen if needed while on immunotherapy   No orders of the defined types were placed in this encounter.  All questions were answered. The patient knows to call the clinic with any problems, questions or concerns. No barriers to learning was detected. I spent 15 minutes counseling the patient face to face. The total time spent in the appointment was 20 minutes and more than 50% was on counseling and review of test results     [name], MD 02/22/2017 10:43 AM

## 2017-02-22 NOTE — Assessment & Plan Note (Signed)
The patient has cancer associated pain. His pain is intermittent in nature and I reassured the patient it is not due to cancer progression I recommend we continue the dose of oxycodone to 15 mg every 8 hours as needed for pain

## 2017-02-22 NOTE — Telephone Encounter (Signed)
Patient declined avs and calendar  °

## 2017-02-22 NOTE — Assessment & Plan Note (Signed)
The patient has complete response on recent imaging study in May 2018 He will continue treatment indefinitely I do not plan to repeat imaging study again until the end of the year, due end of the year

## 2017-02-22 NOTE — Patient Instructions (Signed)
Pembrolizumab injection  What is this medicine?  PEMBROLIZUMAB (pem broe liz ue mab) is a monoclonal antibody. It is used to treat melanoma, head and neck cancer, Hodgkin lymphoma, non-small cell lung cancer, urothelial cancer, stomach cancer, and cancers that have a certain genetic condition.  This medicine may be used for other purposes; ask your health care provider or pharmacist if you have questions.  COMMON BRAND NAME(S): Keytruda  What should I tell my health care provider before I take this medicine?  They need to know if you have any of these conditions:  -diabetes  -immune system problems  -inflammatory bowel disease  -liver disease  -lung or breathing disease  -lupus  -organ transplant  -an unusual or allergic reaction to pembrolizumab, other medicines, foods, dyes, or preservatives  -pregnant or trying to get pregnant  -breast-feeding  How should I use this medicine?  This medicine is for infusion into a vein. It is given by a health care professional in a hospital or clinic setting.  A special MedGuide will be given to you before each treatment. Be sure to read this information carefully each time.  Talk to your pediatrician regarding the use of this medicine in children. While this drug may be prescribed for selected conditions, precautions do apply.  Overdosage: If you think you have taken too much of this medicine contact a poison control center or emergency room at once.  NOTE: This medicine is only for you. Do not share this medicine with others.  What if I miss a dose?  It is important not to miss your dose. Call your doctor or health care professional if you are unable to keep an appointment.  What may interact with this medicine?  Interactions have not been studied.  Give your health care provider a list of all the medicines, herbs, non-prescription drugs, or dietary supplements you use. Also tell them if you smoke, drink alcohol, or use illegal drugs. Some items may interact with your  medicine.  This list may not describe all possible interactions. Give your health care provider a list of all the medicines, herbs, non-prescription drugs, or dietary supplements you use. Also tell them if you smoke, drink alcohol, or use illegal drugs. Some items may interact with your medicine.  What should I watch for while using this medicine?  Your condition will be monitored carefully while you are receiving this medicine.  You may need blood work done while you are taking this medicine.  Do not become pregnant while taking this medicine or for 4 months after stopping it. Women should inform their doctor if they wish to become pregnant or think they might be pregnant. There is a potential for serious side effects to an unborn child. Talk to your health care professional or pharmacist for more information. Do not breast-feed an infant while taking this medicine or for 4 months after the last dose.  What side effects may I notice from receiving this medicine?  Side effects that you should report to your doctor or health care professional as soon as possible:  -allergic reactions like skin rash, itching or hives, swelling of the face, lips, or tongue  -bloody or black, tarry  -breathing problems  -changes in vision  -chest pain  -chills  -constipation  -cough  -dizziness or feeling faint or lightheaded  -fast or irregular heartbeat  -fever  -flushing  -hair loss  -low blood counts - this medicine may decrease the number of white blood cells, red blood cells   and platelets. You may be at increased risk for infections and bleeding.  -muscle pain  -muscle weakness  -persistent headache  -signs and symptoms of high blood sugar such as dizziness; dry mouth; dry skin; fruity breath; nausea; stomach pain; increased hunger or thirst; increased urination  -signs and symptoms of kidney injury like trouble passing urine or change in the amount of urine  -signs and symptoms of liver injury like dark urine, light-colored  stools, loss of appetite, nausea, right upper belly pain, yellowing of the eyes or skin  -stomach pain  -sweating  -weight loss  Side effects that usually do not require medical attention (report to your doctor or health care professional if they continue or are bothersome):  -decreased appetite  -diarrhea  -tiredness  This list may not describe all possible side effects. Call your doctor for medical advice about side effects. You may report side effects to FDA at 1-800-FDA-1088.  Where should I keep my medicine?  This drug is given in a hospital or clinic and will not be stored at home.  NOTE: This sheet is a summary. It may not cover all possible information. If you have questions about this medicine, talk to your doctor, pharmacist, or health care provider.   2018 Elsevier/Gold Standard (2016-01-12 12:29:36)

## 2017-02-22 NOTE — Assessment & Plan Note (Signed)
He has extremely poor dentition that appears worse He has seen a recent dentist who recommended complete dental extraction I have provided him a letter of clearance from the oncology standpoint There is no contraindication for him to proceed with dental extraction, hyperbaric oxygen if needed while on immunotherapy

## 2017-02-22 NOTE — Assessment & Plan Note (Signed)
His kidney function is stable. As long as his creatinine clearance remain greater than 30, we will continue chemotherapy Monitor only

## 2017-02-27 ENCOUNTER — Encounter (HOSPITAL_BASED_OUTPATIENT_CLINIC_OR_DEPARTMENT_OTHER): Payer: Medicaid Other | Attending: Internal Medicine

## 2017-02-27 DIAGNOSIS — Z9221 Personal history of antineoplastic chemotherapy: Secondary | ICD-10-CM | POA: Diagnosis not present

## 2017-02-27 DIAGNOSIS — M272 Inflammatory conditions of jaws: Secondary | ICD-10-CM | POA: Diagnosis not present

## 2017-02-27 DIAGNOSIS — Z87891 Personal history of nicotine dependence: Secondary | ICD-10-CM | POA: Insufficient documentation

## 2017-02-27 DIAGNOSIS — Y842 Radiological procedure and radiotherapy as the cause of abnormal reaction of the patient, or of later complication, without mention of misadventure at the time of the procedure: Secondary | ICD-10-CM | POA: Insufficient documentation

## 2017-02-27 DIAGNOSIS — Z8589 Personal history of malignant neoplasm of other organs and systems: Secondary | ICD-10-CM | POA: Diagnosis not present

## 2017-02-27 DIAGNOSIS — I129 Hypertensive chronic kidney disease with stage 1 through stage 4 chronic kidney disease, or unspecified chronic kidney disease: Secondary | ICD-10-CM | POA: Diagnosis not present

## 2017-02-27 DIAGNOSIS — N182 Chronic kidney disease, stage 2 (mild): Secondary | ICD-10-CM | POA: Diagnosis not present

## 2017-02-27 DIAGNOSIS — E039 Hypothyroidism, unspecified: Secondary | ICD-10-CM | POA: Insufficient documentation

## 2017-02-27 DIAGNOSIS — Z923 Personal history of irradiation: Secondary | ICD-10-CM | POA: Diagnosis not present

## 2017-03-02 MED FILL — LEVOTHYROXINE 25 MCG TABLET: 25 | 30 days supply | Qty: 30 | Fill #0

## 2017-03-23 ENCOUNTER — Ambulatory Visit (HOSPITAL_BASED_OUTPATIENT_CLINIC_OR_DEPARTMENT_OTHER): Payer: Medicaid Other

## 2017-03-23 ENCOUNTER — Other Ambulatory Visit (HOSPITAL_BASED_OUTPATIENT_CLINIC_OR_DEPARTMENT_OTHER): Payer: Medicaid Other

## 2017-03-23 ENCOUNTER — Encounter: Payer: Self-pay | Admitting: Hematology and Oncology

## 2017-03-23 ENCOUNTER — Ambulatory Visit: Payer: Medicaid Other

## 2017-03-23 ENCOUNTER — Telehealth: Payer: Self-pay | Admitting: Hematology and Oncology

## 2017-03-23 ENCOUNTER — Ambulatory Visit (HOSPITAL_BASED_OUTPATIENT_CLINIC_OR_DEPARTMENT_OTHER): Payer: Medicaid Other | Admitting: Hematology and Oncology

## 2017-03-23 VITALS — BP 132/99 | HR 82 | Temp 97.9°F | Resp 18 | Ht 74.0 in | Wt 287.8 lb

## 2017-03-23 DIAGNOSIS — C099 Malignant neoplasm of tonsil, unspecified: Secondary | ICD-10-CM

## 2017-03-23 DIAGNOSIS — Z5112 Encounter for antineoplastic immunotherapy: Secondary | ICD-10-CM

## 2017-03-23 DIAGNOSIS — Z5111 Encounter for antineoplastic chemotherapy: Secondary | ICD-10-CM

## 2017-03-23 DIAGNOSIS — Z23 Encounter for immunization: Secondary | ICD-10-CM

## 2017-03-23 DIAGNOSIS — G893 Neoplasm related pain (acute) (chronic): Secondary | ICD-10-CM

## 2017-03-23 DIAGNOSIS — E039 Hypothyroidism, unspecified: Secondary | ICD-10-CM

## 2017-03-23 DIAGNOSIS — K089 Disorder of teeth and supporting structures, unspecified: Secondary | ICD-10-CM

## 2017-03-23 DIAGNOSIS — Z79899 Other long term (current) drug therapy: Secondary | ICD-10-CM

## 2017-03-23 LAB — CBC WITH DIFFERENTIAL/PLATELET
BASO%: 0.2 % (ref 0.0–2.0)
Basophils Absolute: 0 10*3/uL (ref 0.0–0.1)
EOS ABS: 0.2 10*3/uL (ref 0.0–0.5)
EOS%: 3.9 % (ref 0.0–7.0)
HEMATOCRIT: 40.2 % (ref 38.4–49.9)
HGB: 12.8 g/dL — ABNORMAL LOW (ref 13.0–17.1)
LYMPH%: 14.1 % (ref 14.0–49.0)
MCH: 29.6 pg (ref 27.2–33.4)
MCHC: 31.8 g/dL — AB (ref 32.0–36.0)
MCV: 93.1 fL (ref 79.3–98.0)
MONO#: 0.4 10*3/uL (ref 0.1–0.9)
MONO%: 6.5 % (ref 0.0–14.0)
NEUT#: 4.5 10*3/uL (ref 1.5–6.5)
NEUT%: 75.3 % — AB (ref 39.0–75.0)
PLATELETS: 185 10*3/uL (ref 140–400)
RBC: 4.32 10*6/uL (ref 4.20–5.82)
RDW: 13.9 % (ref 11.0–14.6)
WBC: 6 10*3/uL (ref 4.0–10.3)
lymph#: 0.8 10*3/uL — ABNORMAL LOW (ref 0.9–3.3)

## 2017-03-23 LAB — COMPREHENSIVE METABOLIC PANEL
ALBUMIN: 4 g/dL (ref 3.5–5.0)
ALT: 17 U/L (ref 0–55)
AST: 22 U/L (ref 5–34)
Alkaline Phosphatase: 56 U/L (ref 40–150)
Anion Gap: 10 mEq/L (ref 3–11)
BUN: 14.9 mg/dL (ref 7.0–26.0)
CO2: 23 mEq/L (ref 22–29)
Calcium: 8.9 mg/dL (ref 8.4–10.4)
Chloride: 106 mEq/L (ref 98–109)
Creatinine: 1.8 mg/dL — ABNORMAL HIGH (ref 0.7–1.3)
EGFR: 50 mL/min/{1.73_m2} — ABNORMAL LOW (ref >60)
GLUCOSE: 101 mg/dL (ref 70–140)
POTASSIUM: 4.2 meq/L (ref 3.5–5.1)
SODIUM: 139 meq/L (ref 136–145)
Total Bilirubin: 0.45 mg/dL (ref 0.20–1.20)
Total Protein: 7 g/dL (ref 6.4–8.3)

## 2017-03-23 LAB — TSH: TSH: 4.017 m(IU)/L (ref 0.320–4.118)

## 2017-03-23 MED ORDER — SODIUM CHLORIDE 0.9 % IV SOLN
Freq: Once | INTRAVENOUS | Status: AC
  Administered 2017-03-23: 10:00:00 via INTRAVENOUS

## 2017-03-23 MED ORDER — SODIUM CHLORIDE 0.9% FLUSH
10.0000 mL | INTRAVENOUS | Status: DC | PRN
  Administered 2017-03-23: 10 mL
  Filled 2017-03-23: qty 10

## 2017-03-23 MED ORDER — SODIUM CHLORIDE 0.9 % IV SOLN
200.0000 mg | Freq: Once | INTRAVENOUS | Status: AC
  Administered 2017-03-23: 200 mg via INTRAVENOUS
  Filled 2017-03-23: qty 8

## 2017-03-23 MED ORDER — INFLUENZA VAC SPLIT QUAD 0.5 ML IM SUSY
0.5000 mL | PREFILLED_SYRINGE | Freq: Once | INTRAMUSCULAR | Status: AC
  Administered 2017-03-23: 0.5 mL via INTRAMUSCULAR
  Filled 2017-03-23: qty 0.5

## 2017-03-23 MED ORDER — SODIUM CHLORIDE 0.9 % IJ SOLN
10.0000 mL | INTRAMUSCULAR | Status: DC | PRN
  Administered 2017-03-23: 10 mL via INTRAVENOUS
  Filled 2017-03-23: qty 10

## 2017-03-23 MED ORDER — HEPARIN SOD (PORK) LOCK FLUSH 100 UNIT/ML IV SOLN
500.0000 [IU] | Freq: Once | INTRAVENOUS | Status: AC | PRN
  Administered 2017-03-23: 500 [IU]
  Filled 2017-03-23: qty 5

## 2017-03-23 MED ORDER — OXYCODONE HCL 15 MG PO TABS: 15.0000 mg | ORAL_TABLET | Freq: Three times a day (TID) | ORAL | 0 refills | Status: DC | PRN

## 2017-03-23 MED ORDER — LEVOTHYROXINE SODIUM 25 MCG PO TABS: ORAL_TABLET | ORAL | 1 refills | Status: DC

## 2017-03-23 MED FILL — oxyCODONE HCL 15 MG TABS: 15 | 30 days supply | Qty: 90 | Fill #0

## 2017-03-23 NOTE — Progress Notes (Signed)
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 FNA of right level III lymph node was inconclusive for cancer      07/25/2013 Imaging    PET scan showed locally advanced disease with abnormal lymphadenopathy in the right axilla      08/06/2013 Procedure    CT-guided biopsy of the lymphadenopathy was negative for malignancy      08/15/2013 Surgery    Patient has placement of port and feeding tube      08/19/2013 - 09/10/2013 Chemotherapy    Patient received chemotherapy with cisplatin. The patient only received 2 doses due to uncontrolled nausea and acute renal failure.      08/19/2013 - 10/15/2013 Radiation Therapy    Received Helical IMRT Tomotherapy:  Right Tonstil and bilateral neck / 70 Gy in 35 fractions to gross disease, 63 Gy in 35 fractions to high risk nodal echelons, and 56 Gy in 35 fractions to intermediate risk nodal echelons.      08/27/2013 - 08/30/2013 Hospital Admission    The patient was admitted to the hospital for uncontrolled nausea vomiting and dehydration.      02/14/2014 Imaging    PET/CT scan showed complete response to treatment      03/19/2014 Surgery    He had  excisional lymph node biopsy from the right neck. Pathology was benign      05/13/2014 Surgery    He had removal of Port-A-Cath.      05/22/2014 Imaging    Repeat CT scan of the neck show no evidence of disease recurrence.      12/09/2014 Imaging    Ct neck without contrast showed persistent abnormalities on the right side of neck, indeterminate      12/25/2014 Imaging    PET CT scan showed disease recurrence.      02/10/2015 Procedure    He has placement of port      02/13/2015 - 07/13/2015 Chemotherapy    He received chemotherapy with carbo/Taxol      04/21/2015 Imaging    PET CT scan showed improved disease control      08/04/2015 Imaging    PET scan showed persistent disease      08/17/2015 -  Chemotherapy    He was started with [facility]      10/19/2015 Imaging    Ct neck showed mass-like intermediate density soft tissue at the right lateral neck recurrence site stable.       02/26/2016 Imaging    CT neck showed unchanged appearance of right neck recurrence compared to 10/19/2015 CT. No noncontrast evidence of new metastatic disease in the neck. Chronic sinusitis, progressed.      02/26/2016 Imaging    Diffuse but  patchy and asymmetric partial airspace filling process (ground-glass opacity) in the lungs. This could be due to respiratory bronchiolitis, hypersensitivity pneumonitis or possible drug reaction. Atypical/viral pneumonia is also possible. Pulmonary consultation may be a helpful. A three-month follow-up noncontrast chest CT is suggested. Slight interval enlargement of mediastinal lymph nodes and a small lymph node along the left major fissure. This is most likely due to the inflammatory process in the lungs. No findings for metastatic disease involving the chest. No findings for upper abdominal metastatic disease.      02/29/2016 Adverse Reaction    His treatment is placed on hold due to possible hypersensitivity pneumonitis/drug reaction      04/14/2016 Imaging     Ct chest showed no evidence for metastatic disease within the chest. Significant interval improvement and near complete resolution of previously described diffuse bilateral predominately ground-glass pulmonary opacities, most compatible with resolving infectious/inflammatory process.      09/05/2016 Imaging    CT scan of neck and chest  1. Unchanged appearance of right neck recurrence. 2. No evidence of new metastatic disease in the neck. 3. Unremarkable and stable CT appearance of the chest. No findings suspicious for metastatic disease       INTERVAL HISTORY: Please see below for problem oriented charting. He returns for further follow-up He continues to have jaw pain He is awaiting hyperbaric chamber treatments for poor dentition He denies recent smoking or drinking No new neck lumps Denies cough, chest pain or shortness of breath He tolerated treatment very well so far He denies recent infection  REVIEW OF SYSTEMS:   Constitutional: Denies fevers, chills or abnormal weight loss Eyes: Denies blurriness of vision Ears, nose, mouth, throat, and face: Denies mucositis or sore throat Respiratory: Denies cough, dyspnea or wheezes Cardiovascular: Denies palpitation, chest discomfort or lower extremity swelling Gastrointestinal:  Denies nausea, heartburn or change in bowel habits Skin: Denies abnormal skin rashes Lymphatics: Denies new lymphadenopathy or easy bruising Neurological:Denies numbness, tingling or new weaknesses Behavioral/Psych: Mood is stable, no new changes  All other systems were reviewed with the patient and are negative.  I have reviewed the past medical history, past surgical history, social history and family history with the patient and they are unchanged from previous note.  ALLERGIES:  is allergic to pollen extract.  MEDICATIONS:  Current Outpatient Medications  Medication Sig Dispense Refill  . chlorhexidine (PERIDEX) 0.12 % solution Rinse with 15 mls  twice daily for 30 seconds. Use after breakfast and at bedtime. Spit out excess. Do not swallow. 960 mL prn  . fluticasone (FLONASE) 50 MCG/ACT nasal spray Place 2 sprays into both nostrils daily. 16 g 2  . levothyroxine (SYNTHROID, LEVOTHROID) 25 MCG tablet TAKE 1 TABLET BY MOUTH DAILY BEFORE BREAKFAST. 90 tablet 1  . lidocaine-prilocaine (EMLA) cream Apply 1 application topically as needed. 30 g 6  . Multiple Vitamin (MULTIVITAMIN WITH MINERALS) TABS tablet Take 1 tablet by mouth daily.    Marland Kitchen omeprazole (PRILOSEC) 20 MG capsule Take 1 capsule (20 mg total) by mouth daily. 30 capsule 0  . oxyCODONE (ROXICODONE) 15 MG immediate release tablet Take 1 tablet (15 mg total) by mouth every 8 (eight) hours as needed. 90 tablet 0  . traZODone (DESYREL) 150 MG tablet Take 1 tablet (150 mg total) by mouth at bedtime. 30 tablet 11   No current facility-administered medications for this visit.    Facility-Administered Medications Ordered in Other Visits  Medication Dose Route Frequency Provider Last Rate Last Dose  .  sodium chloride 0.9 % injection 10 mL  10 mL Intravenous PRN [name], MD   10 mL at 11/29/16 0818    PHYSICAL EXAMINATION: ECOG PERFORMANCE STATUS: 1 - Symptomatic but completely ambulatory  Vitals:   03/23/17 0916  BP: (!) 132/99  Pulse: 82  Resp: 18  Temp: 97.9 F (36.6 C)  SpO2: 100%   Filed Weights   03/23/17 0916  Weight: 287 lb 12.8 oz (130.5 kg)    GENERAL:alert, no distress and comfortable SKIN: skin color, texture, turgor are normal, no rashes or significant lesions EYES: normal, Conjunctiva are pink and non-injected, sclera clear OROPHARYNX:no exudate, no erythema and lips, buccal mucosa, and tongue normal.  Poor dentition noted NECK: supple, thyroid normal size, non-tender, without nodularity LYMPH:  no palpable lymphadenopathy in the cervical, axillary or inguinal LUNGS: clear to auscultation and percussion with normal breathing effort HEART: regular rate &  rhythm and no murmurs and no lower extremity edema ABDOMEN:abdomen soft, non-tender and normal bowel sounds Musculoskeletal:no cyanosis of digits and no clubbing  NEURO: alert & oriented x 3 with fluent speech, no focal motor/sensory deficits  LABORATORY DATA:  I have reviewed the data as listed    Component Value Date/Time   NA 141 02/22/2017 0857   K 4.2 02/22/2017 0857   CL 107 07/23/2015 1513   CO2 26 02/22/2017 0857   GLUCOSE 86 02/22/2017 0857   BUN 14.5 02/22/2017 0857   CREATININE 1.7 (H) 02/22/2017 0857   CALCIUM 9.5 02/22/2017 0857   PROT 7.1 02/22/2017 0857   ALBUMIN 3.9 02/22/2017 0857   AST 23 02/22/2017 0857   ALT 19 02/22/2017 0857   ALKPHOS 61 02/22/2017 0857   BILITOT 0.36 02/22/2017 0857   GFRNONAA 44 (L) 07/23/2015 1513   GFRAA 51 (L) 07/23/2015 1513    No results found for: SPEP, UPEP  Lab Results  Component Value Date   WBC 6.0 03/23/2017   NEUTROABS 4.5 03/23/2017   HGB 12.8 (L) 03/23/2017   HCT 40.2 03/23/2017   MCV 93.1 03/23/2017   PLT 185 03/23/2017      Chemistry      Component Value Date/Time   NA 141 02/22/2017 0857   K 4.2 02/22/2017 0857   CL 107 07/23/2015 1513   CO2 26 02/22/2017 0857   BUN 14.5 02/22/2017 0857   CREATININE 1.7 (H) 02/22/2017 0857      Component Value Date/Time   CALCIUM 9.5 02/22/2017 0857   ALKPHOS 61 02/22/2017 0857   AST 23 02/22/2017 0857   ALT 19 02/22/2017 0857   BILITOT 0.36 02/22/2017 0857      ASSESSMENT & PLAN:  Tonsillar cancer The patient has complete response on recent imaging study in May 2018 He will continue treatment indefinitely I do not plan to repeat imaging study again until next year  Poor dentition He has extremely poor dentition that 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 His pain is intermittent in nature and I reassured the patient it is not due to cancer progression I recommend we continue the dose of oxycodone to 15 mg every 8 hours as needed for pain  Acquired hypothyroidism He is taking his thyroid medicine as  instructed We will continue to monitor his thyroid function tests periodically while on treatment.   Orders Placed This Encounter  Procedures  . TSH    Standing Status:   Standing    Number of Occurrences:   9    Standing Expiration Date:   03/23/2018   All questions were answered. The patient knows to call the clinic with any problems, questions or concerns. No barriers to learning was detected. I spent 15 minutes counseling the patient face to face. The total time spent in the appointment was 20 minutes and more than 50% was on counseling and review of test results     [name], MD 03/23/2017 9:33 AM

## 2017-03-23 NOTE — Assessment & Plan Note (Signed)
The patient has complete response on recent imaging study in May 2018 He will continue treatment indefinitely I do not plan to repeat imaging study again until next year

## 2017-03-23 NOTE — Assessment & Plan Note (Signed)
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

## 2017-03-23 NOTE — Patient Instructions (Signed)
[facility] Discharge Instructions for Patients Receiving Chemotherapy  Today you received the following chemotherapy agents :  Keytruda.  To help prevent nausea and vomiting after your treatment, we encourage you to take your nausea medication as prescribed.   If you develop nausea and vomiting that is not controlled by your nausea medication, call the clinic.   BELOW ARE SYMPTOMS THAT SHOULD BE REPORTED IMMEDIATELY:  *FEVER GREATER THAN 100.5 F  *CHILLS WITH OR WITHOUT FEVER  NAUSEA AND VOMITING THAT IS NOT CONTROLLED WITH YOUR NAUSEA MEDICATION  *UNUSUAL SHORTNESS OF BREATH  *UNUSUAL BRUISING OR BLEEDING  TENDERNESS IN MOUTH AND THROAT WITH OR WITHOUT PRESENCE OF ULCERS  *URINARY PROBLEMS  *BOWEL PROBLEMS  UNUSUAL RASH Items with * indicate a potential emergency and should be followed up as soon as possible.  Feel free to call the clinic should you have any questions or concerns. The clinic phone number is [phone].  Please show the CHEMO ALERT CARD at check-in to the Emergency Department and triage nurse.  

## 2017-03-23 NOTE — Telephone Encounter (Signed)
Scheduled appt per 12/6 los - patient to get updated schedule on my chart - patient my chart active and request no print out.

## 2017-03-23 NOTE — Assessment & Plan Note (Signed)
The patient has cancer associated pain. His pain is intermittent in nature and I reassured the patient it is not due to cancer progression I recommend we continue the dose of oxycodone to 15 mg every 8 hours as needed for pain

## 2017-03-23 NOTE — Assessment & Plan Note (Signed)
He is taking his thyroid medicine as instructed We will continue to monitor his thyroid function tests periodically while on treatment.

## 2017-03-30 ENCOUNTER — Encounter (HOSPITAL_BASED_OUTPATIENT_CLINIC_OR_DEPARTMENT_OTHER): Payer: Medicaid Other | Attending: Internal Medicine

## 2017-03-30 DIAGNOSIS — Z87891 Personal history of nicotine dependence: Secondary | ICD-10-CM | POA: Insufficient documentation

## 2017-03-30 DIAGNOSIS — J439 Emphysema, unspecified: Secondary | ICD-10-CM | POA: Insufficient documentation

## 2017-03-30 DIAGNOSIS — Z85818 Personal history of malignant neoplasm of other sites of lip, oral cavity, and pharynx: Secondary | ICD-10-CM | POA: Insufficient documentation

## 2017-03-30 DIAGNOSIS — M272 Inflammatory conditions of jaws: Secondary | ICD-10-CM | POA: Insufficient documentation

## 2017-03-30 MED FILL — LEVOTHYROXINE 25 MCG TABLET: 25 | 30 days supply | Qty: 30 | Fill #1

## 2017-03-31 DIAGNOSIS — Z87891 Personal history of nicotine dependence: Secondary | ICD-10-CM | POA: Diagnosis not present

## 2017-03-31 DIAGNOSIS — J439 Emphysema, unspecified: Secondary | ICD-10-CM | POA: Diagnosis not present

## 2017-03-31 DIAGNOSIS — M272 Inflammatory conditions of jaws: Secondary | ICD-10-CM | POA: Diagnosis present

## 2017-03-31 DIAGNOSIS — Z85818 Personal history of malignant neoplasm of other sites of lip, oral cavity, and pharynx: Secondary | ICD-10-CM | POA: Diagnosis not present

## 2017-04-04 DIAGNOSIS — M272 Inflammatory conditions of jaws: Secondary | ICD-10-CM | POA: Diagnosis not present

## 2017-04-07 DIAGNOSIS — M272 Inflammatory conditions of jaws: Secondary | ICD-10-CM | POA: Diagnosis not present

## 2017-04-13 DIAGNOSIS — M272 Inflammatory conditions of jaws: Secondary | ICD-10-CM | POA: Diagnosis not present

## 2017-04-14 DIAGNOSIS — M272 Inflammatory conditions of jaws: Secondary | ICD-10-CM | POA: Diagnosis not present

## 2017-04-19 ENCOUNTER — Encounter (HOSPITAL_BASED_OUTPATIENT_CLINIC_OR_DEPARTMENT_OTHER): Payer: Medicaid Other | Attending: Physician Assistant

## 2017-04-19 DIAGNOSIS — C099 Malignant neoplasm of tonsil, unspecified: Secondary | ICD-10-CM | POA: Diagnosis not present

## 2017-04-19 DIAGNOSIS — Y842 Radiological procedure and radiotherapy as the cause of abnormal reaction of the patient, or of later complication, without mention of misadventure at the time of the procedure: Secondary | ICD-10-CM | POA: Insufficient documentation

## 2017-04-19 DIAGNOSIS — M272 Inflammatory conditions of jaws: Secondary | ICD-10-CM | POA: Insufficient documentation

## 2017-04-20 ENCOUNTER — Encounter: Payer: Self-pay | Admitting: Hematology and Oncology

## 2017-04-20 ENCOUNTER — Telehealth: Payer: Self-pay | Admitting: Hematology and Oncology

## 2017-04-20 ENCOUNTER — Ambulatory Visit: Payer: Medicaid Other

## 2017-04-20 ENCOUNTER — Ambulatory Visit (HOSPITAL_BASED_OUTPATIENT_CLINIC_OR_DEPARTMENT_OTHER): Payer: Medicaid Other

## 2017-04-20 ENCOUNTER — Other Ambulatory Visit (HOSPITAL_BASED_OUTPATIENT_CLINIC_OR_DEPARTMENT_OTHER): Payer: Medicaid Other

## 2017-04-20 ENCOUNTER — Telehealth (HOSPITAL_COMMUNITY): Payer: Self-pay | Admitting: Dentistry

## 2017-04-20 ENCOUNTER — Ambulatory Visit (HOSPITAL_BASED_OUTPATIENT_CLINIC_OR_DEPARTMENT_OTHER): Payer: Medicaid Other | Admitting: Hematology and Oncology

## 2017-04-20 DIAGNOSIS — Z5112 Encounter for antineoplastic immunotherapy: Secondary | ICD-10-CM | POA: Diagnosis not present

## 2017-04-20 DIAGNOSIS — W888XXA Exposure to other ionizing radiation, initial encounter: Secondary | ICD-10-CM

## 2017-04-20 DIAGNOSIS — C099 Malignant neoplasm of tonsil, unspecified: Secondary | ICD-10-CM

## 2017-04-20 DIAGNOSIS — M879 Osteonecrosis, unspecified: Secondary | ICD-10-CM

## 2017-04-20 DIAGNOSIS — E039 Hypothyroidism, unspecified: Secondary | ICD-10-CM | POA: Diagnosis not present

## 2017-04-20 DIAGNOSIS — G893 Neoplasm related pain (acute) (chronic): Secondary | ICD-10-CM

## 2017-04-20 DIAGNOSIS — M8730 Other secondary osteonecrosis, unspecified bone: Secondary | ICD-10-CM

## 2017-04-20 DIAGNOSIS — D630 Anemia in neoplastic disease: Secondary | ICD-10-CM

## 2017-04-20 DIAGNOSIS — N182 Chronic kidney disease, stage 2 (mild): Secondary | ICD-10-CM

## 2017-04-20 DIAGNOSIS — Z5111 Encounter for antineoplastic chemotherapy: Secondary | ICD-10-CM

## 2017-04-20 LAB — CBC WITH DIFFERENTIAL/PLATELET
BASO%: 0.2 % (ref 0.0–2.0)
Basophils Absolute: 0 10*3/uL (ref 0.0–0.1)
EOS ABS: 0.2 10*3/uL (ref 0.0–0.5)
EOS%: 3.7 % (ref 0.0–7.0)
HEMATOCRIT: 40 % (ref 38.4–49.9)
HEMOGLOBIN: 12.8 g/dL — AB (ref 13.0–17.1)
LYMPH#: 0.8 10*3/uL — AB (ref 0.9–3.3)
LYMPH%: 13.9 % — ABNORMAL LOW (ref 14.0–49.0)
MCH: 29.6 pg (ref 27.2–33.4)
MCHC: 32 g/dL (ref 32.0–36.0)
MCV: 92.6 fL (ref 79.3–98.0)
MONO#: 0.4 10*3/uL (ref 0.1–0.9)
MONO%: 6.3 % (ref 0.0–14.0)
NEUT%: 75.9 % — ABNORMAL HIGH (ref 39.0–75.0)
NEUTROS ABS: 4.5 10*3/uL (ref 1.5–6.5)
PLATELETS: 187 10*3/uL (ref 140–400)
RBC: 4.32 10*6/uL (ref 4.20–5.82)
RDW: 13.7 % (ref 11.0–14.6)
WBC: 5.9 10*3/uL (ref 4.0–10.3)

## 2017-04-20 LAB — COMPREHENSIVE METABOLIC PANEL
ALT: 17 U/L (ref 0–55)
ANION GAP: 8 meq/L (ref 3–11)
AST: 20 U/L (ref 5–34)
Albumin: 4 g/dL (ref 3.5–5.0)
Alkaline Phosphatase: 61 U/L (ref 40–150)
BUN: 16.7 mg/dL (ref 7.0–26.0)
CALCIUM: 9 mg/dL (ref 8.4–10.4)
CHLORIDE: 106 meq/L (ref 98–109)
CO2: 26 mEq/L (ref 22–29)
Creatinine: 2 mg/dL — ABNORMAL HIGH (ref 0.7–1.3)
EGFR: 44 mL/min/{1.73_m2} — AB (ref >60)
Glucose: 96 mg/dl (ref 70–140)
POTASSIUM: 4.3 meq/L (ref 3.5–5.1)
Sodium: 141 mEq/L (ref 136–145)
Total Bilirubin: 0.49 mg/dL (ref 0.20–1.20)
Total Protein: 7 g/dL (ref 6.4–8.3)

## 2017-04-20 LAB — TSH: TSH: 3.075 m(IU)/L (ref 0.320–4.118)

## 2017-04-20 MED ORDER — SODIUM CHLORIDE 0.9 % IV SOLN
Freq: Once | INTRAVENOUS | Status: AC
  Administered 2017-04-20: 13:00:00 via INTRAVENOUS

## 2017-04-20 MED ORDER — OXYCODONE HCL 15 MG PO TABS: 15.0000 mg | ORAL_TABLET | Freq: Three times a day (TID) | ORAL | 0 refills | Status: DC | PRN

## 2017-04-20 MED ORDER — SODIUM CHLORIDE 0.9 % IV SOLN
200.0000 mg | Freq: Once | INTRAVENOUS | Status: AC
  Administered 2017-04-20: 200 mg via INTRAVENOUS
  Filled 2017-04-20: qty 8

## 2017-04-20 MED ORDER — HEPARIN SOD (PORK) LOCK FLUSH 100 UNIT/ML IV SOLN
500.0000 [IU] | Freq: Once | INTRAVENOUS | Status: AC | PRN
  Administered 2017-04-20: 500 [IU]
  Filled 2017-04-20: qty 5

## 2017-04-20 MED ORDER — FENTANYL 50 MCG/HR TD PT72: 50.0000 ug | MEDICATED_PATCH | TRANSDERMAL | 0 refills | Status: DC

## 2017-04-20 MED ORDER — SODIUM CHLORIDE 0.9 % IJ SOLN
10.0000 mL | INTRAMUSCULAR | Status: DC | PRN
  Administered 2017-04-20: 10 mL via INTRAVENOUS
  Filled 2017-04-20: qty 10

## 2017-04-20 MED ORDER — SODIUM CHLORIDE 0.9% FLUSH
10.0000 mL | INTRAVENOUS | Status: DC | PRN
  Administered 2017-04-20: 10 mL
  Filled 2017-04-20: qty 10

## 2017-04-20 MED FILL — oxyCODONE HCL 15 MG TABS: 15 | 30 days supply | Qty: 90 | Fill #0

## 2017-04-20 MED FILL — fentaNYL 50 MCG/HR PT72: 50 | 15 days supply | Qty: 5 | Fill #0

## 2017-04-20 NOTE — Assessment & Plan Note (Signed)
The patient has complete response on recent imaging study in May 2018 He will continue treatment indefinitely I do not plan to repeat imaging study again until May of this year

## 2017-04-20 NOTE — Assessment & Plan Note (Signed)
This is likely anemia of chronic disease. The patient denies recent history of bleeding such as epistaxis, hematuria or hematochezia. He is asymptomatic from the anemia. We will observe for now.  

## 2017-04-20 NOTE — Telephone Encounter (Signed)
04/20/2017  Patient:            Mason [name] Date of Birth:  1966-03-06 MRN:                [id number]   I called the patient at home to discuss his current plan of care for extraction of remaining teeth. Patient indicates that he will have received 10 hyperbaric oxygen treatments after therapy tomorrow.   Patient was instructed to set up dental extractions and then he will be treated with an additional 10 hyperbaric oxygen therapy treatments after the dental extractions. I then instructed the patient to contact the oral surgeon, Dr. Frederik Schmidt, to set up the dental extraction procedures as per previous discussion with Dr. Frederik Schmidt this afternoon. The patient is to contact Dental Medicine if other questions arise.  [name], DDS

## 2017-04-20 NOTE — Assessment & Plan Note (Signed)
His kidney function is stable. As long as his creatinine clearance remain greater than 30, we will continue chemotherapy Monitor only

## 2017-04-20 NOTE — Progress Notes (Signed)
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 FNA of right level III lymph node was inconclusive for cancer      07/25/2013 Imaging    PET scan showed locally advanced disease with abnormal lymphadenopathy in the right axilla      08/06/2013 Procedure    CT-guided biopsy of the lymphadenopathy was negative for malignancy      08/15/2013 Surgery    Patient has placement of port and feeding tube      08/19/2013 - 09/10/2013 Chemotherapy    Patient received chemotherapy with cisplatin. The patient only received 2 doses due to uncontrolled nausea and acute renal failure.      08/19/2013 - 10/15/2013 Radiation Therapy    Received Helical IMRT Tomotherapy:  Right Tonstil and bilateral neck / 70 Gy in 35 fractions to gross disease, 63 Gy in 35 fractions to high risk nodal echelons, and 56 Gy in 35 fractions to intermediate risk nodal echelons.      08/27/2013 - 08/30/2013 Hospital Admission    The patient was admitted to the hospital for uncontrolled nausea vomiting and dehydration.      02/14/2014 Imaging    PET/CT scan showed complete response to treatment      03/19/2014 Surgery    He had  excisional lymph node biopsy from the right neck. Pathology was benign      05/13/2014 Surgery    He had removal of Port-A-Cath.      05/22/2014 Imaging    Repeat CT scan of the neck show no evidence of disease recurrence.      12/09/2014 Imaging    Ct neck without contrast showed persistent abnormalities on the right side of neck, indeterminate      12/25/2014 Imaging    PET CT scan showed disease recurrence.      02/10/2015 Procedure    He has placement of port      02/13/2015 - 07/13/2015 Chemotherapy    He received chemotherapy with carbo/Taxol      04/21/2015 Imaging    PET CT scan showed improved disease control      08/04/2015 Imaging    PET scan showed persistent disease      08/17/2015 -  Chemotherapy    He was started with [facility]      10/19/2015 Imaging    Ct neck showed mass-like intermediate density soft tissue at the right lateral neck recurrence site stable.       02/26/2016 Imaging    CT neck showed unchanged appearance of right neck recurrence compared to 10/19/2015 CT. No noncontrast evidence of new metastatic disease in the neck. Chronic sinusitis, progressed.      02/26/2016 Imaging    Diffuse but  patchy and asymmetric partial airspace filling process (ground-glass opacity) in the lungs. This could be due to respiratory bronchiolitis, hypersensitivity pneumonitis or possible drug reaction. Atypical/viral pneumonia is also possible. Pulmonary consultation may be a helpful. A three-month follow-up noncontrast chest CT is suggested. Slight interval enlargement of mediastinal lymph nodes and a small lymph node along the left major fissure. This is most likely due to the inflammatory process in the lungs. No findings for metastatic disease involving the chest. No findings for upper abdominal metastatic disease.      02/29/2016 Adverse Reaction    His treatment is placed on hold due to possible hypersensitivity pneumonitis/drug reaction      04/14/2016 Imaging     Ct chest showed no evidence for metastatic disease within the chest. Significant interval improvement and near complete resolution of previously described diffuse bilateral predominately ground-glass pulmonary opacities, most compatible with resolving infectious/inflammatory process.      09/05/2016 Imaging    CT scan of neck and chest  1. Unchanged appearance of right neck recurrence. 2. No evidence of new metastatic disease in the neck. 3. Unremarkable and stable CT appearance of the chest. No findings suspicious for metastatic disease       INTERVAL HISTORY: Please see below for problem oriented charting. He returns for further follow-up He complained of worsening pain due to recent hyperbaric chamber treatment for osteonecrosis of the jaw He is interested to get dental extraction soon He denies recent fever or chills Appetite is stable, no recent weight loss He denies recent constipation or nausea from his medications  REVIEW OF SYSTEMS:   Constitutional: Denies fevers, chills or abnormal weight loss Eyes: Denies blurriness of vision Ears, nose, mouth, throat, and face: Denies mucositis or sore throat Respiratory: Denies cough, dyspnea or wheezes Cardiovascular: Denies palpitation, chest discomfort or lower extremity swelling Gastrointestinal:  Denies nausea, heartburn or change in bowel habits Skin: Denies abnormal skin rashes Lymphatics: Denies new lymphadenopathy or easy bruising Neurological:Denies numbness, tingling or new weaknesses Behavioral/Psych: Mood is stable, no new changes  All other systems were reviewed with the patient and are negative.  I have reviewed the past medical history, past surgical history, social history and family history with the patient and they are unchanged from previous note.  ALLERGIES:  is allergic to pollen extract.  MEDICATIONS:  Current Outpatient Medications  Medication Sig Dispense Refill  . chlorhexidine (PERIDEX) 0.12 %  solution Rinse with 15 mls twice daily for 30 seconds. Use after breakfast and at bedtime. Spit out excess. Do not swallow. 960 mL prn  . fentaNYL (DURAGESIC - DOSED MCG/HR) 50 MCG/HR Place 1 patch (50 mcg total) onto the skin every 3 (three) days. 5 patch 0  . fluticasone (FLONASE) 50 MCG/ACT nasal spray Place 2 sprays into both nostrils daily. 16 g 2  . levothyroxine (SYNTHROID, LEVOTHROID) 25 MCG tablet TAKE 1 TABLET BY MOUTH DAILY BEFORE BREAKFAST. 90 tablet 1  . lidocaine-prilocaine (EMLA) cream Apply 1 application topically as needed. 30 g 6  . Multiple Vitamin (MULTIVITAMIN WITH MINERALS) TABS tablet Take 1 tablet by mouth daily.    Marland Kitchen omeprazole (PRILOSEC) 20 MG capsule Take 1 capsule (20 mg total) by mouth daily. 30 capsule 0  . oxyCODONE (ROXICODONE) 15 MG immediate release tablet Take 1 tablet (15 mg total) by mouth every 8 (eight) hours as needed. 90 tablet 0  . traZODone (DESYREL) 150 MG tablet Take 1 tablet (150 mg total) by mouth at bedtime. 30 tablet 11   No  current facility-administered medications for this visit.    Facility-Administered Medications Ordered in Other Visits  Medication Dose Route Frequency Provider Last Rate Last Dose  . sodium chloride 0.9 % injection 10 mL  10 mL Intravenous PRN Alvy Bimler, Nachmen Mansel, MD   10 mL at 11/29/16 0818  . sodium chloride flush (NS) 0.9 % injection 10 mL  10 mL Intracatheter PRN [name], MD   10 mL at 04/20/17 1354    PHYSICAL EXAMINATION: ECOG PERFORMANCE STATUS: 1 - Symptomatic but completely ambulatory  Vitals:   04/20/17 1147  BP: 128/88  Pulse: 85  Resp: 18  Temp: 98.5 F (36.9 C)  SpO2: 100%   Filed Weights   04/20/17 1147  Weight: 287 lb 3.2 oz (130.3 kg)    GENERAL:alert, no distress and comfortable SKIN: skin color, texture, turgor are normal, no rashes or significant lesions EYES: normal, Conjunctiva are pink and non-injected, sclera clear OROPHARYNX:no exudate, no erythema and lips, buccal mucosa, and tongue  normal.  Very poor dentition with persistent changes consistent with osteonecrosis of the jaw NECK: supple, thyroid normal size, non-tender, without nodularity LYMPH:  no palpable lymphadenopathy in the cervical, axillary or inguinal LUNGS: clear to auscultation and percussion with normal breathing effort HEART: regular rate & rhythm and no murmurs and no lower extremity edema ABDOMEN:abdomen soft, non-tender and normal bowel sounds Musculoskeletal:no cyanosis of digits and no clubbing  NEURO: alert & oriented x 3 with fluent speech, no focal motor/sensory deficits  LABORATORY DATA:  I have reviewed the data as listed    Component Value Date/Time   NA 141 04/20/2017 1028   K 4.3 04/20/2017 1028   CL 107 07/23/2015 1513   CO2 26 04/20/2017 1028   GLUCOSE 96 04/20/2017 1028   BUN 16.7 04/20/2017 1028   CREATININE 2.0 (H) 04/20/2017 1028   CALCIUM 9.0 04/20/2017 1028   PROT 7.0 04/20/2017 1028   ALBUMIN 4.0 04/20/2017 1028   AST 20 04/20/2017 1028   ALT 17 04/20/2017 1028   ALKPHOS 61 04/20/2017 1028   BILITOT 0.49 04/20/2017 1028   GFRNONAA 44 (L) 07/23/2015 1513   GFRAA 51 (L) 07/23/2015 1513    No results found for: SPEP, UPEP  Lab Results  Component Value Date   WBC 5.9 04/20/2017   NEUTROABS 4.5 04/20/2017   HGB 12.8 (L) 04/20/2017   HCT 40.0 04/20/2017   MCV 92.6 04/20/2017   PLT 187 04/20/2017      Chemistry      Component Value Date/Time   NA 141 04/20/2017 1028   K 4.3 04/20/2017 1028   CL 107 07/23/2015 1513   CO2 26 04/20/2017 1028   BUN 16.7 04/20/2017 1028   CREATININE 2.0 (H) 04/20/2017 1028      Component Value Date/Time   CALCIUM 9.0 04/20/2017 1028   ALKPHOS 61 04/20/2017 1028   AST 20 04/20/2017 1028   ALT 17 04/20/2017 1028   BILITOT 0.49 04/20/2017 1028      ASSESSMENT & PLAN:  Tonsillar cancer The patient has complete response on recent imaging study in May 2018 He will continue treatment indefinitely I do not plan to repeat imaging  study again until May of this year  Chronic kidney disease (CKD), stage II (mild) His kidney function is stable. As long as his creatinine clearance remain greater than 30, we will continue chemotherapy Monitor only  Anemia in neoplastic disease This is likely anemia of chronic disease. The patient denies recent history of bleeding such as epistaxis, hematuria  or hematochezia. He is asymptomatic from the anemia. We will observe for now.    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  All questions were answered. The patient knows to call the clinic with any problems, questions or concerns. No barriers to learning was detected. I spent 25 minutes counseling the patient face to face. The total time spent in the appointment was 30 minutes and more than 50% was on counseling and review of test results     [name], MD 04/20/2017 2:18 PM

## 2017-04-20 NOTE — Assessment & Plan Note (Signed)
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

## 2017-04-20 NOTE — Patient Instructions (Signed)
[facility] Discharge Instructions for Patients Receiving Chemotherapy  Today you received the following chemotherapy agents:  Keytruda.  To help prevent nausea and vomiting after your treatment, we encourage you to take your nausea medication as directed.   If you develop nausea and vomiting that is not controlled by your nausea medication, call the clinic.   BELOW ARE SYMPTOMS THAT SHOULD BE REPORTED IMMEDIATELY:  *FEVER GREATER THAN 100.5 F  *CHILLS WITH OR WITHOUT FEVER  NAUSEA AND VOMITING THAT IS NOT CONTROLLED WITH YOUR NAUSEA MEDICATION  *UNUSUAL SHORTNESS OF BREATH  *UNUSUAL BRUISING OR BLEEDING  TENDERNESS IN MOUTH AND THROAT WITH OR WITHOUT PRESENCE OF ULCERS  *URINARY PROBLEMS  *BOWEL PROBLEMS  UNUSUAL RASH Items with * indicate a potential emergency and should be followed up as soon as possible.  Feel free to call the clinic should you have any questions or concerns. The clinic phone number is [phone].  Please show the CHEMO ALERT CARD at check-in to the Emergency Department and triage nurse.    

## 2017-04-20 NOTE — Telephone Encounter (Signed)
Gave patient AVS and calendar of upcoming January appointments °

## 2017-04-20 NOTE — Assessment & Plan Note (Signed)
He has severe, worsening pain due to osteonecrosis of the jaw He is receiving hyperbaric chamber With poorly controlled pain, we discussed options for chronic pain management Ultimately, we agreed to keep current dose of oxycodone but to add on fentanyl patch I warned him about risk of sedation and constipation

## 2017-04-26 DIAGNOSIS — M272 Inflammatory conditions of jaws: Secondary | ICD-10-CM | POA: Diagnosis not present

## 2017-04-28 DIAGNOSIS — M272 Inflammatory conditions of jaws: Secondary | ICD-10-CM | POA: Diagnosis not present

## 2017-05-02 DIAGNOSIS — M272 Inflammatory conditions of jaws: Secondary | ICD-10-CM | POA: Diagnosis not present

## 2017-05-04 DIAGNOSIS — M272 Inflammatory conditions of jaws: Secondary | ICD-10-CM | POA: Diagnosis not present

## 2017-05-05 DIAGNOSIS — M272 Inflammatory conditions of jaws: Secondary | ICD-10-CM | POA: Diagnosis not present

## 2017-05-08 ENCOUNTER — Other Ambulatory Visit: Payer: Self-pay | Admitting: *Deleted

## 2017-05-08 MED ORDER — LEVOTHYROXINE SODIUM 25 MCG PO TABS: ORAL_TABLET | ORAL | 1 refills | Status: DC

## 2017-05-08 MED FILL — LEVOTHYROXINE 25 MCG TABLET: 25 | 90 days supply | Qty: 90 | Fill #0

## 2017-05-09 DIAGNOSIS — M272 Inflammatory conditions of jaws: Secondary | ICD-10-CM | POA: Diagnosis not present

## 2017-05-10 DIAGNOSIS — M272 Inflammatory conditions of jaws: Secondary | ICD-10-CM | POA: Diagnosis not present

## 2017-05-16 ENCOUNTER — Telehealth: Payer: Self-pay | Admitting: *Deleted

## 2017-05-16 NOTE — Telephone Encounter (Signed)
Pt wanted to let Dr Alvy Bimler know he is having dental surgery 2/7.

## 2017-05-18 ENCOUNTER — Encounter: Payer: Self-pay | Admitting: Hematology and Oncology

## 2017-05-18 ENCOUNTER — Inpatient Hospital Stay: Payer: Medicaid Other | Attending: Hematology and Oncology

## 2017-05-18 ENCOUNTER — Inpatient Hospital Stay: Payer: Medicaid Other

## 2017-05-18 ENCOUNTER — Inpatient Hospital Stay (HOSPITAL_BASED_OUTPATIENT_CLINIC_OR_DEPARTMENT_OTHER): Payer: Medicaid Other | Admitting: Hematology and Oncology

## 2017-05-18 DIAGNOSIS — Z5112 Encounter for antineoplastic immunotherapy: Secondary | ICD-10-CM | POA: Diagnosis present

## 2017-05-18 DIAGNOSIS — N182 Chronic kidney disease, stage 2 (mild): Secondary | ICD-10-CM | POA: Insufficient documentation

## 2017-05-18 DIAGNOSIS — C099 Malignant neoplasm of tonsil, unspecified: Secondary | ICD-10-CM

## 2017-05-18 DIAGNOSIS — Z79899 Other long term (current) drug therapy: Secondary | ICD-10-CM

## 2017-05-18 DIAGNOSIS — E039 Hypothyroidism, unspecified: Secondary | ICD-10-CM

## 2017-05-18 DIAGNOSIS — M8788 Other osteonecrosis, other site: Secondary | ICD-10-CM

## 2017-05-18 DIAGNOSIS — G893 Neoplasm related pain (acute) (chronic): Secondary | ICD-10-CM | POA: Insufficient documentation

## 2017-05-18 DIAGNOSIS — M879 Osteonecrosis, unspecified: Secondary | ICD-10-CM

## 2017-05-18 DIAGNOSIS — W888XXA Exposure to other ionizing radiation, initial encounter: Secondary | ICD-10-CM

## 2017-05-18 DIAGNOSIS — M8730 Other secondary osteonecrosis, unspecified bone: Secondary | ICD-10-CM

## 2017-05-18 DIAGNOSIS — Z5111 Encounter for antineoplastic chemotherapy: Secondary | ICD-10-CM

## 2017-05-18 DIAGNOSIS — Z923 Personal history of irradiation: Secondary | ICD-10-CM

## 2017-05-18 LAB — CBC WITH DIFFERENTIAL/PLATELET
Basophils Absolute: 0.1 10*3/uL (ref 0.0–0.1)
Basophils Relative: 1 %
Eosinophils Absolute: 0.2 10*3/uL (ref 0.0–0.5)
Eosinophils Relative: 2 %
HCT: 39.3 % (ref 38.4–49.9)
HEMOGLOBIN: 12.7 g/dL — AB (ref 13.0–17.1)
LYMPHS ABS: 0.8 10*3/uL — AB (ref 0.9–3.3)
LYMPHS PCT: 8 %
MCH: 29.1 pg (ref 27.2–33.4)
MCHC: 32.3 g/dL (ref 32.0–36.0)
MCV: 90.1 fL (ref 79.3–98.0)
Monocytes Absolute: 0.5 10*3/uL (ref 0.1–0.9)
Monocytes Relative: 6 %
NEUTROS PCT: 83 %
Neutro Abs: 7.8 10*3/uL — ABNORMAL HIGH (ref 1.5–6.5)
Platelets: 204 10*3/uL (ref 140–400)
RBC: 4.36 MIL/uL (ref 4.20–5.82)
RDW: 14.6 % (ref 11.0–14.6)
WBC: 9.3 10*3/uL (ref 4.0–10.3)

## 2017-05-18 LAB — COMPREHENSIVE METABOLIC PANEL
ALT: 12 U/L (ref 0–55)
AST: 19 U/L (ref 5–34)
Albumin: 3.9 g/dL (ref 3.5–5.0)
Alkaline Phosphatase: 73 U/L (ref 40–150)
Anion gap: 7 (ref 3–11)
BUN: 10 mg/dL (ref 7–26)
CHLORIDE: 108 mmol/L (ref 98–109)
CO2: 26 mmol/L (ref 22–29)
Calcium: 8.9 mg/dL (ref 8.4–10.4)
Creatinine, Ser: 1.77 mg/dL — ABNORMAL HIGH (ref 0.70–1.30)
GFR, EST AFRICAN AMERICAN: 50 mL/min — AB (ref >60)
GFR, EST NON AFRICAN AMERICAN: 43 mL/min — AB (ref >60)
Glucose, Bld: 87 mg/dL (ref 70–140)
POTASSIUM: 4.3 mmol/L (ref 3.5–5.1)
SODIUM: 141 mmol/L (ref 136–145)
Total Bilirubin: 0.4 mg/dL (ref 0.2–1.2)
Total Protein: 7 g/dL (ref 6.4–8.3)

## 2017-05-18 LAB — TSH: TSH: 3.87 u[IU]/mL (ref 0.320–4.118)

## 2017-05-18 MED ORDER — HEPARIN SOD (PORK) LOCK FLUSH 100 UNIT/ML IV SOLN
500.0000 [IU] | Freq: Once | INTRAVENOUS | Status: DC | PRN
  Filled 2017-05-18: qty 5

## 2017-05-18 MED ORDER — SODIUM CHLORIDE 0.9 % IJ SOLN
10.0000 mL | INTRAMUSCULAR | Status: DC | PRN
  Administered 2017-05-18: 10 mL via INTRAVENOUS
  Filled 2017-05-18: qty 10

## 2017-05-18 MED ORDER — OXYCODONE HCL 15 MG PO TABS
15.0000 mg | ORAL_TABLET | Freq: Three times a day (TID) | ORAL | 0 refills | Status: DC | PRN
Start: 2017-05-18 — End: 2017-06-15

## 2017-05-18 MED ORDER — SODIUM CHLORIDE 0.9 % IV SOLN
200.0000 mg | Freq: Once | INTRAVENOUS | Status: AC
  Administered 2017-05-18: 200 mg via INTRAVENOUS
  Filled 2017-05-18: qty 8

## 2017-05-18 MED ORDER — SODIUM CHLORIDE 0.9% FLUSH
10.0000 mL | INTRAVENOUS | Status: DC | PRN
  Administered 2017-05-18: 10 mL
  Filled 2017-05-18: qty 10

## 2017-05-18 MED ORDER — HEPARIN SOD (PORK) LOCK FLUSH 100 UNIT/ML IV SOLN
500.0000 [IU] | Freq: Once | INTRAVENOUS | Status: AC | PRN
  Administered 2017-05-18: 500 [IU]
  Filled 2017-05-18: qty 5

## 2017-05-18 MED ORDER — SODIUM CHLORIDE 0.9 % IV SOLN
Freq: Once | INTRAVENOUS | Status: AC
  Administered 2017-05-18: 10:00:00 via INTRAVENOUS

## 2017-05-18 MED FILL — oxyCODONE HCL 15 MG TABS: 15 | 30 days supply | Qty: 90 | Fill #0

## 2017-05-18 NOTE — Assessment & Plan Note (Signed)
His kidney function is stable. As long as his creatinine clearance remain greater than 30, we will continue chemotherapy Monitor only

## 2017-05-18 NOTE — Patient Instructions (Signed)
Implanted Port Home Guide An implanted port is a type of central line that is placed under the skin. Central lines are used to provide IV access when treatment or nutrition needs to be given through a person's veins. Implanted ports are used for long-term IV access. An implanted port may be placed because:  You need IV medicine that would be irritating to the small veins in your hands or arms.  You need long-term IV medicines, such as antibiotics.  You need IV nutrition for a long period.  You need frequent blood draws for lab tests.  You need dialysis.  Implanted ports are usually placed in the chest area, but they can also be placed in the upper arm, the abdomen, or the leg. An implanted port has two main parts:  Reservoir. The reservoir is round and will appear as a small, raised area under your skin. The reservoir is the part where a needle is inserted to give medicines or draw blood.  Catheter. The catheter is a thin, flexible tube that extends from the reservoir. The catheter is placed into a large vein. Medicine that is inserted into the reservoir goes into the catheter and then into the vein.  How will I care for my incision site? Do not get the incision site wet. Bathe or shower as directed by your health care provider. How is my port accessed? Special steps must be taken to access the port:  Before the port is accessed, a numbing cream can be placed on the skin. This helps numb the skin over the port site.  Your health care provider uses a sterile technique to access the port. ? Your health care provider must put on a mask and sterile gloves. ? The skin over your port is cleaned carefully with an antiseptic and allowed to dry. ? The port is gently pinched between sterile gloves, and a needle is inserted into the port.  Only "non-coring" port needles should be used to access the port. Once the port is accessed, a blood return should be checked. This helps ensure that the port  is in the vein and is not clogged.  If your port needs to remain accessed for a constant infusion, a clear (transparent) bandage will be placed over the needle site. The bandage and needle will need to be changed every week, or as directed by your health care provider.  Keep the bandage covering the needle clean and dry. Do not get it wet. Follow your health care provider's instructions on how to take a shower or bath while the port is accessed.  If your port does not need to stay accessed, no bandage is needed over the port.  What is flushing? Flushing helps keep the port from getting clogged. Follow your health care provider's instructions on how and when to flush the port. Ports are usually flushed with saline solution or a medicine called heparin. The need for flushing will depend on how the port is used.  If the port is used for intermittent medicines or blood draws, the port will need to be flushed: ? After medicines have been given. ? After blood has been drawn. ? As part of routine maintenance.  If a constant infusion is running, the port may not need to be flushed.  How long will my port stay implanted? The port can stay in for as long as your health care provider thinks it is needed. When it is time for the port to come out, surgery will be   done to remove it. The procedure is similar to the one performed when the port was put in. When should I seek immediate medical care? When you have an implanted port, you should seek immediate medical care if:  You notice a bad smell coming from the incision site.  You have swelling, redness, or drainage at the incision site.  You have more swelling or pain at the port site or the surrounding area.  You have a fever that is not controlled with medicine.  This information is not intended to replace advice given to you by your health care provider. Make sure you discuss any questions you have with your health care provider. Document  Released: 04/04/2005 Document Revised: 09/10/2015 Document Reviewed: 12/10/2012 Elsevier Interactive Patient Education  2017 [employer]  

## 2017-05-18 NOTE — Patient Instructions (Signed)
[facility] Discharge Instructions for Patients Receiving Chemotherapy  Today you received the following chemotherapy agents:  Keytruda.  To help prevent nausea and vomiting after your treatment, we encourage you to take your nausea medication as directed.   If you develop nausea and vomiting that is not controlled by your nausea medication, call the clinic.   BELOW ARE SYMPTOMS THAT SHOULD BE REPORTED IMMEDIATELY:  *FEVER GREATER THAN 100.5 F  *CHILLS WITH OR WITHOUT FEVER  NAUSEA AND VOMITING THAT IS NOT CONTROLLED WITH YOUR NAUSEA MEDICATION  *UNUSUAL SHORTNESS OF BREATH  *UNUSUAL BRUISING OR BLEEDING  TENDERNESS IN MOUTH AND THROAT WITH OR WITHOUT PRESENCE OF ULCERS  *URINARY PROBLEMS  *BOWEL PROBLEMS  UNUSUAL RASH Items with * indicate a potential emergency and should be followed up as soon as possible.  Feel free to call the clinic should you have any questions or concerns. The clinic phone number is [phone].  Please show the CHEMO ALERT CARD at check-in to the Emergency Department and triage nurse.    

## 2017-05-18 NOTE — Progress Notes (Signed)
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 FNA of right level III lymph node was inconclusive for cancer      07/25/2013 Imaging    PET scan showed locally advanced disease with abnormal lymphadenopathy in the right axilla      08/06/2013 Procedure    CT-guided biopsy of the lymphadenopathy was negative for malignancy      08/15/2013 Surgery    Patient has placement of port and feeding tube      08/19/2013 - 09/10/2013 Chemotherapy    Patient received chemotherapy with cisplatin. The patient only received 2 doses due to uncontrolled nausea and acute renal failure.      08/19/2013 - 10/15/2013 Radiation Therapy    Received Helical IMRT Tomotherapy:  Right Tonstil and bilateral neck / 70 Gy in 35 fractions to gross disease, 63 Gy in 35 fractions to high risk nodal echelons, and 56 Gy in 35 fractions to intermediate risk nodal echelons.      08/27/2013 - 08/30/2013 Hospital Admission    The patient was admitted to the hospital for uncontrolled nausea vomiting and dehydration.      02/14/2014 Imaging    PET/CT scan showed complete response to treatment      03/19/2014 Surgery    He had  excisional lymph node biopsy from the right neck. Pathology was benign      05/13/2014 Surgery    He had removal of Port-A-Cath.      05/22/2014 Imaging    Repeat CT scan of the neck show no evidence of disease recurrence.      12/09/2014 Imaging    Ct neck without contrast showed persistent abnormalities on the right side of neck, indeterminate      12/25/2014 Imaging    PET CT scan showed disease recurrence.      02/10/2015 Procedure    He has placement of port      02/13/2015 - 07/13/2015 Chemotherapy    He received chemotherapy with carbo/Taxol      04/21/2015 Imaging    PET CT scan showed improved disease control      08/04/2015 Imaging    PET scan showed persistent disease      08/17/2015 -  Chemotherapy    He was started with [facility]      10/19/2015 Imaging    Ct neck showed mass-like intermediate density soft tissue at the right lateral neck recurrence site stable.       02/26/2016 Imaging    CT neck showed unchanged appearance of right neck recurrence compared to 10/19/2015 CT. No noncontrast evidence of new metastatic disease in the neck. Chronic sinusitis, progressed.      02/26/2016 Imaging    Diffuse but  patchy and asymmetric partial airspace filling process (ground-glass opacity) in the lungs. This could be due to respiratory bronchiolitis, hypersensitivity pneumonitis or possible drug reaction. Atypical/viral pneumonia is also possible. Pulmonary consultation may be a helpful. A three-month follow-up noncontrast chest CT is suggested. Slight interval enlargement of mediastinal lymph nodes and a small lymph node along the left major fissure. This is most likely due to the inflammatory process in the lungs. No findings for metastatic disease involving the chest. No findings for upper abdominal metastatic disease.      02/29/2016 Adverse Reaction    His treatment is placed on hold due to possible hypersensitivity pneumonitis/drug reaction      04/14/2016 Imaging     Ct chest showed no evidence for metastatic disease within the chest. Significant interval improvement and near complete resolution of previously described diffuse bilateral predominately ground-glass pulmonary opacities, most compatible with resolving infectious/inflammatory process.      09/05/2016 Imaging    CT scan of neck and chest  1. Unchanged appearance of right neck recurrence. 2. No evidence of new metastatic disease in the neck. 3. Unremarkable and stable CT appearance of the chest. No findings suspicious for metastatic disease       INTERVAL HISTORY: Please see below for problem oriented charting. He returns for further follow-up He continues to have chronic jaw pain from osteonecrosis of the jaw His surgery has been rescheduled to next week In the meantime, the prescription pain medicine is controlling his pain He denies recent fever or chills.  REVIEW OF SYSTEMS:   Constitutional: Denies fevers, chills or abnormal weight loss Eyes: Denies blurriness of vision Ears, nose, mouth, throat, and face: Denies mucositis or sore throat Respiratory: Denies cough, dyspnea or wheezes Cardiovascular: Denies palpitation, chest discomfort or lower extremity swelling Gastrointestinal:  Denies nausea, heartburn or change in bowel habits Skin: Denies abnormal skin rashes Lymphatics: Denies new lymphadenopathy or easy bruising Neurological:Denies numbness, tingling or new weaknesses Behavioral/Psych: Mood is stable, no new changes  All other systems were reviewed with the patient and are negative.  I have reviewed the past medical history, past surgical history, social history and family history with the patient and they are unchanged from previous note.  ALLERGIES:  is allergic to pollen extract.  MEDICATIONS:  Current Outpatient Medications  Medication Sig Dispense Refill  . chlorhexidine (PERIDEX) 0.12 % solution Rinse with 15 mls twice daily for 30 seconds. Use after  breakfast and at bedtime. Spit out excess. Do not swallow. 960 mL prn  . fentaNYL (DURAGESIC - DOSED MCG/HR) 50 MCG/HR Place 1 patch (50 mcg total) onto the skin every 3 (three) days. 5 patch 0  . fluticasone (FLONASE) 50 MCG/ACT nasal spray Place 2 sprays into both nostrils daily. 16 g 2  . levothyroxine (SYNTHROID, LEVOTHROID) 25 MCG tablet TAKE 1 TABLET BY MOUTH DAILY BEFORE BREAKFAST. 90 tablet 1  . lidocaine-prilocaine (EMLA) cream Apply 1 application topically as needed. 30 g 6  . Multiple Vitamin (MULTIVITAMIN WITH MINERALS) TABS tablet Take 1 tablet by mouth daily.    Marland Kitchen omeprazole (PRILOSEC) 20 MG capsule Take 1 capsule (20 mg total) by mouth daily. 30 capsule 0  . oxyCODONE (ROXICODONE) 15 MG immediate release tablet Take 1 tablet (15 mg total) by mouth every 8 (eight) hours as needed. 90 tablet 0  . traZODone (DESYREL) 150 MG tablet Take 1 tablet (150 mg total) by mouth at bedtime. 30 tablet 11   No current facility-administered medications for this visit.  Facility-Administered Medications Ordered in Other Visits  Medication Dose Route Frequency Provider Last Rate Last Dose  . heparin lock flush 100 unit/mL  500 Units Intracatheter Once PRN Alvy Bimler, Hikeem Andersson, MD      . pembrolizumab (KEYTRUDA) 200 mg in sodium chloride 0.9 % 50 mL chemo infusion  200 mg Intravenous Once Alvy Bimler, Bird Swetz, MD 116 mL/hr at 05/18/17 1054 200 mg at 05/18/17 1054  . sodium chloride 0.9 % injection 10 mL  10 mL Intravenous PRN Alvy Bimler, Myna Freimark, MD   10 mL at 11/29/16 0818  . sodium chloride flush (NS) 0.9 % injection 10 mL  10 mL Intracatheter PRN Alvy Bimler, Charlsey Moragne, MD        PHYSICAL EXAMINATION: ECOG PERFORMANCE STATUS: 1 - Symptomatic but completely ambulatory  Vitals:   05/18/17 0924  BP: 109/75  Pulse: 87  Resp: 18  Temp: 98.6 F (37 C)  SpO2: 99%   Filed Weights   05/18/17 0924  Weight: 284 lb 6.4 oz (129 kg)    GENERAL:alert, no distress and comfortable SKIN: skin color, texture, turgor are normal, no  rashes or significant lesions EYES: normal, Conjunctiva are pink and non-injected, sclera clear OROPHARYNX:no exudate, no erythema and lips, buccal mucosa, and tongue normal.  Poor dentition is noted NECK: Well-healed surgical scar. LYMPH:  no palpable lymphadenopathy in the cervical, axillary or inguinal LUNGS: clear to auscultation and percussion with normal breathing effort HEART: regular rate & rhythm and no murmurs and no lower extremity edema ABDOMEN:abdomen soft, non-tender and normal bowel sounds Musculoskeletal:no cyanosis of digits and no clubbing  NEURO: alert & oriented x 3 with fluent speech, no focal motor/sensory deficits  LABORATORY DATA:  I have reviewed the data as listed    Component Value Date/Time   NA 141 05/18/2017 0852   NA 141 04/20/2017 1028   K 4.3 05/18/2017 0852   K 4.3 04/20/2017 1028   CL 108 05/18/2017 0852   CO2 26 05/18/2017 0852   CO2 26 04/20/2017 1028   GLUCOSE 87 05/18/2017 0852   GLUCOSE 96 04/20/2017 1028   BUN 10 05/18/2017 0852   BUN 16.7 04/20/2017 1028   CREATININE 1.77 (H) 05/18/2017 0852   CREATININE 2.0 (H) 04/20/2017 1028   CALCIUM 8.9 05/18/2017 0852   CALCIUM 9.0 04/20/2017 1028   PROT 7.0 05/18/2017 0852   PROT 7.0 04/20/2017 1028   ALBUMIN 3.9 05/18/2017 0852   ALBUMIN 4.0 04/20/2017 1028   AST 19 05/18/2017 0852   AST 20 04/20/2017 1028   ALT 12 05/18/2017 0852   ALT 17 04/20/2017 1028   ALKPHOS 73 05/18/2017 0852   ALKPHOS 61 04/20/2017 1028   BILITOT 0.4 05/18/2017 0852   BILITOT 0.49 04/20/2017 1028   GFRNONAA 43 (L) 05/18/2017 0852   GFRAA 50 (L) 05/18/2017 0852    No results found for: SPEP, UPEP  Lab Results  Component Value Date   WBC 9.3 05/18/2017   NEUTROABS 7.8 (H) 05/18/2017   HGB 12.7 (L) 05/18/2017   HCT 39.3 05/18/2017   MCV 90.1 05/18/2017   PLT 204 05/18/2017      Chemistry      Component Value Date/Time   NA 141 05/18/2017 0852   NA 141 04/20/2017 1028   K 4.3 05/18/2017 0852   K 4.3  04/20/2017 1028   CL 108 05/18/2017 0852   CO2 26 05/18/2017 0852   CO2 26 04/20/2017 1028   BUN 10 05/18/2017 0852   BUN 16.7 04/20/2017 1028   CREATININE 1.77 (H) 05/18/2017  9357   CREATININE 2.0 (H) 04/20/2017 1028      Component Value Date/Time   CALCIUM 8.9 05/18/2017 0852   CALCIUM 9.0 04/20/2017 1028   ALKPHOS 73 05/18/2017 0852   ALKPHOS 61 04/20/2017 1028   AST 19 05/18/2017 0852   AST 20 04/20/2017 1028   ALT 12 05/18/2017 0852   ALT 17 04/20/2017 1028   BILITOT 0.4 05/18/2017 0852   BILITOT 0.49 04/20/2017 1028      ASSESSMENT & PLAN:  Tonsillar cancer The patient has complete response on recent imaging study in May 2018 He will continue treatment indefinitely I do not plan to repeat imaging study again until May of this year  Cancer associated pain He has severe, worsening pain due to osteonecrosis of the jaw Ultimately, we agreed to keep current dose of oxycodone  I warned him about risk of sedation and constipation  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 As long as his creatinine clearance remain greater than 30, we will continue chemotherapy Monitor only   No orders of the defined types were placed in this encounter.  All questions were answered. The patient knows to call the clinic with any problems, questions or concerns. No barriers to learning was detected. I spent 15 minutes counseling the patient face to face. The total time spent in the appointment was 20 minutes and more than 50% was on counseling and review of test results     [name], MD 05/18/2017 10:59  AM

## 2017-05-18 NOTE — Assessment & Plan Note (Signed)
The patient has complete response on recent imaging study in May 2018 He will continue treatment indefinitely I do not plan to repeat imaging study again until May of this year

## 2017-05-18 NOTE — Assessment & Plan Note (Addendum)
He has severe osteonecrosis of the jaw and has received hyperbaric chamber Per discussion with the patient, he has appointment with the dental surgeon to have dental extraction followed by further treatment for this In the meantime, I will continue to prescribe his pain regimen for chronic pain control

## 2017-05-18 NOTE — Assessment & Plan Note (Signed)
He has severe, worsening pain due to osteonecrosis of the jaw Ultimately, we agreed to keep current dose of oxycodone  I warned him about risk of sedation and constipation

## 2017-05-19 ENCOUNTER — Telehealth: Payer: Self-pay | Admitting: Hematology and Oncology

## 2017-05-19 NOTE — Telephone Encounter (Signed)
Spoken to patient regarding upcoming February and March appointments.

## 2017-05-22 ENCOUNTER — Encounter (HOSPITAL_BASED_OUTPATIENT_CLINIC_OR_DEPARTMENT_OTHER): Payer: Medicaid Other | Attending: Internal Medicine

## 2017-05-22 DIAGNOSIS — M272 Inflammatory conditions of jaws: Secondary | ICD-10-CM | POA: Insufficient documentation

## 2017-05-22 DIAGNOSIS — Y842 Radiological procedure and radiotherapy as the cause of abnormal reaction of the patient, or of later complication, without mention of misadventure at the time of the procedure: Secondary | ICD-10-CM | POA: Diagnosis not present

## 2017-05-22 DIAGNOSIS — C099 Malignant neoplasm of tonsil, unspecified: Secondary | ICD-10-CM | POA: Insufficient documentation

## 2017-05-23 DIAGNOSIS — M272 Inflammatory conditions of jaws: Secondary | ICD-10-CM | POA: Diagnosis not present

## 2017-05-24 DIAGNOSIS — M272 Inflammatory conditions of jaws: Secondary | ICD-10-CM | POA: Diagnosis not present

## 2017-05-29 DIAGNOSIS — M272 Inflammatory conditions of jaws: Secondary | ICD-10-CM | POA: Diagnosis not present

## 2017-05-31 DIAGNOSIS — M272 Inflammatory conditions of jaws: Secondary | ICD-10-CM | POA: Diagnosis not present

## 2017-06-01 DIAGNOSIS — M272 Inflammatory conditions of jaws: Secondary | ICD-10-CM | POA: Diagnosis not present

## 2017-06-15 ENCOUNTER — Ambulatory Visit: Payer: Medicaid Other

## 2017-06-15 ENCOUNTER — Inpatient Hospital Stay: Payer: Medicaid Other | Attending: Hematology and Oncology

## 2017-06-15 ENCOUNTER — Inpatient Hospital Stay (HOSPITAL_BASED_OUTPATIENT_CLINIC_OR_DEPARTMENT_OTHER): Payer: Medicaid Other | Admitting: Hematology and Oncology

## 2017-06-15 ENCOUNTER — Inpatient Hospital Stay: Payer: Medicaid Other

## 2017-06-15 ENCOUNTER — Encounter: Payer: Self-pay | Admitting: Hematology and Oncology

## 2017-06-15 ENCOUNTER — Ambulatory Visit (HOSPITAL_BASED_OUTPATIENT_CLINIC_OR_DEPARTMENT_OTHER): Payer: Self-pay | Admitting: Medical

## 2017-06-15 VITALS — BP 132/90 | HR 47

## 2017-06-15 DIAGNOSIS — D630 Anemia in neoplastic disease: Secondary | ICD-10-CM | POA: Diagnosis not present

## 2017-06-15 DIAGNOSIS — C099 Malignant neoplasm of tonsil, unspecified: Secondary | ICD-10-CM | POA: Insufficient documentation

## 2017-06-15 DIAGNOSIS — Z79899 Other long term (current) drug therapy: Secondary | ICD-10-CM | POA: Diagnosis not present

## 2017-06-15 DIAGNOSIS — Z923 Personal history of irradiation: Secondary | ICD-10-CM

## 2017-06-15 DIAGNOSIS — J329 Chronic sinusitis, unspecified: Secondary | ICD-10-CM | POA: Diagnosis not present

## 2017-06-15 DIAGNOSIS — G893 Neoplasm related pain (acute) (chronic): Secondary | ICD-10-CM

## 2017-06-15 DIAGNOSIS — Z5112 Encounter for antineoplastic immunotherapy: Secondary | ICD-10-CM | POA: Insufficient documentation

## 2017-06-15 DIAGNOSIS — N182 Chronic kidney disease, stage 2 (mild): Secondary | ICD-10-CM

## 2017-06-15 DIAGNOSIS — E039 Hypothyroidism, unspecified: Secondary | ICD-10-CM

## 2017-06-15 DIAGNOSIS — T8090XA Unspecified complication following infusion and therapeutic injection, initial encounter: Secondary | ICD-10-CM

## 2017-06-15 DIAGNOSIS — Z5111 Encounter for antineoplastic chemotherapy: Secondary | ICD-10-CM

## 2017-06-15 LAB — CBC WITH DIFFERENTIAL/PLATELET
Basophils Absolute: 0 10*3/uL (ref 0.0–0.1)
Basophils Relative: 0 %
EOS PCT: 2 %
Eosinophils Absolute: 0.2 10*3/uL (ref 0.0–0.5)
HCT: 38.7 % (ref 38.4–49.9)
Hemoglobin: 12.2 g/dL — ABNORMAL LOW (ref 13.0–17.1)
LYMPHS ABS: 0.9 10*3/uL (ref 0.9–3.3)
Lymphocytes Relative: 13 %
MCH: 29 pg (ref 27.2–33.4)
MCHC: 31.5 g/dL — AB (ref 32.0–36.0)
MCV: 91.9 fL (ref 79.3–98.0)
MONO ABS: 0.5 10*3/uL (ref 0.1–0.9)
MONOS PCT: 6 %
Neutro Abs: 5.5 10*3/uL (ref 1.5–6.5)
Neutrophils Relative %: 79 %
PLATELETS: 226 10*3/uL (ref 140–400)
RBC: 4.21 MIL/uL (ref 4.20–5.82)
RDW: 13.9 % (ref 11.0–14.6)
WBC: 7 10*3/uL (ref 4.0–10.3)

## 2017-06-15 LAB — COMPREHENSIVE METABOLIC PANEL
ALT: 14 U/L (ref 0–55)
ANION GAP: 11 (ref 3–11)
AST: 18 U/L (ref 5–34)
Albumin: 3.9 g/dL (ref 3.5–5.0)
Alkaline Phosphatase: 64 U/L (ref 40–150)
BUN: 15 mg/dL (ref 7–26)
CALCIUM: 9.5 mg/dL (ref 8.4–10.4)
CO2: 24 mmol/L (ref 22–29)
Chloride: 106 mmol/L (ref 98–109)
Creatinine, Ser: 1.54 mg/dL — ABNORMAL HIGH (ref 0.70–1.30)
GFR, EST AFRICAN AMERICAN: 60 mL/min — AB (ref >60)
GFR, EST NON AFRICAN AMERICAN: 51 mL/min — AB (ref >60)
Glucose, Bld: 91 mg/dL (ref 70–140)
Potassium: 3.9 mmol/L (ref 3.5–5.1)
Sodium: 141 mmol/L (ref 136–145)
Total Bilirubin: 0.8 mg/dL (ref 0.2–1.2)
Total Protein: 7.2 g/dL (ref 6.4–8.3)

## 2017-06-15 LAB — TSH: TSH: 4.445 u[IU]/mL — AB (ref 0.320–4.118)

## 2017-06-15 MED ORDER — OXYCODONE HCL 15 MG PO TABS: 15.0000 mg | ORAL_TABLET | Freq: Three times a day (TID) | ORAL | 0 refills | Status: DC | PRN

## 2017-06-15 MED ORDER — LIDOCAINE-PRILOCAINE 2.5-2.5 % EX CREA: 1.0000 "application " | TOPICAL_CREAM | CUTANEOUS | 6 refills | Status: DC | PRN

## 2017-06-15 MED ORDER — CLONIDINE HCL 0.1 MG PO TABS
ORAL_TABLET | ORAL | Status: AC
  Filled 2017-06-15: qty 1

## 2017-06-15 MED ORDER — SODIUM CHLORIDE 0.9% FLUSH
10.0000 mL | INTRAVENOUS | Status: DC | PRN
  Administered 2017-06-15: 10 mL
  Filled 2017-06-15: qty 10

## 2017-06-15 MED ORDER — HEPARIN SOD (PORK) LOCK FLUSH 100 UNIT/ML IV SOLN
500.0000 [IU] | Freq: Once | INTRAVENOUS | Status: AC | PRN
  Administered 2017-06-15: 500 [IU]
  Filled 2017-06-15: qty 5

## 2017-06-15 MED ORDER — CLONIDINE HCL 0.1 MG PO TABS
0.1000 mg | ORAL_TABLET | ORAL | Status: AC
  Administered 2017-06-15: 0.1 mg via ORAL

## 2017-06-15 MED ORDER — SODIUM CHLORIDE 0.9 % IV SOLN
Freq: Once | INTRAVENOUS | Status: AC
  Administered 2017-06-15: 09:00:00 via INTRAVENOUS

## 2017-06-15 MED ORDER — SODIUM CHLORIDE 0.9 % IJ SOLN
10.0000 mL | INTRAMUSCULAR | Status: DC | PRN
  Administered 2017-06-15: 10 mL via INTRAVENOUS
  Filled 2017-06-15: qty 10

## 2017-06-15 MED ORDER — SODIUM CHLORIDE 0.9 % IV SOLN
200.0000 mg | Freq: Once | INTRAVENOUS | Status: AC
  Administered 2017-06-15: 200 mg via INTRAVENOUS
  Filled 2017-06-15: qty 8

## 2017-06-15 MED FILL — LIDOCAINE-PRILOCAINE CREAM: 2.5-2.5 | 10 days supply | Qty: 30 | Fill #0

## 2017-06-15 MED FILL — oxyCODONE HCL 15 MG TABS: 15 | 30 days supply | Qty: 90 | Fill #0

## 2017-06-15 NOTE — Assessment & Plan Note (Signed)
The patient has complete response on recent imaging study in May 2018 He will continue treatment indefinitely I do not plan to repeat imaging study again until May of this year

## 2017-06-15 NOTE — Progress Notes (Signed)
[facility] Progress Note   Mason [name] [id number] 02-14-1966 52 y.o.  Mason [name] is managed by Dr. Delfin Edis presents for:  Chemotherapy:  no        Monoclonal Antibody: yes       Immunoglobulin: no       Bisphosphonate: no        Transfusion:  no     Current Therapy: Mason [name] was completed receiving Keytruda at the time of his reaction.  First dose of Keytruda: no  Last Treated: 05/18/2017  Assessment: Plan:    Infusion reaction, initial encounter  Mason [name] was seen in the infusion room for a suspected infusion reaction. He had completed receiving Keytruda at the time of his reaction. His symptoms included: Elevated blood pressure and tunnel vision. He was not premedicated prior to starting his infusion. [name] was given something to eat and clonidine 0.1 mg p.o. after onset of his symptoms. [name] did  respond to intervention.  His tunnel vision resolved after he ate something.  His elevated blood pressure returned to normal after he was given clonidine 0.1 mg.  His blood pressures have been within normal limits.  He is not on any antihypertensive medications.   Please see After Visit Summary for patient specific instructions.  Future Appointments  Date Time Provider [place]  07/13/2017  8:00 AM [facility] LAB 6 [facility] None  07/13/2017  8:15 AM [facility] FLUSH NURSE 2 [facility] None  07/13/2017  8:45 AM Gorsuch, Ni, MD [facility] None  07/13/2017  9:30 AM [facility] E16 [facility] None    No orders of the defined types were placed in this encounter.      Subjective:   Patient ID:  Mason [name] is a 52 y.o. (DOB July 29, 1965) male.  Chief Complaint: No chief complaint on file.   HPI Mason [name] was seen in the infusion room for a suspected infusion reaction. He had completed receiving Keytruda at the time of his reaction. His  symptoms included: Elevated blood pressure and tunnel vision. He was not premedicated prior to starting his infusion. [name] was given something to eat and clonidine 0.1 mg p.o. after onset of his symptoms. [name] did  respond to intervention.  His tunnel vision resolved after he ate something.  His elevated blood pressure returned to normal after he was given clonidine 0.1 mg.  His blood pressures have been within normal limits.  He is not on any antihypertensive medications.   Medications: I have reviewed the patient's current medications.  Allergies:  Allergies  Allergen Reactions  . Pollen Extract Other (See Comments)    Watery eyes, runny nose, sneezing    Past Medical History:  Diagnosis Date  . Abnormal liver function test   . Acute sinusitis, unspecified 05/18/2015  . Anemia   . Anxiety    mild new dx  . Arthritis    knees,hips  . Bilateral edema of lower extremity   . Complication of anesthesia    Pt stated " my oxygen level was slow in rising."  . Concussion    Hx: in high school x 2  . Constipation   . Dysuria 09/04/2015  . Fever   . Hyperactive gag reflex   . Hypertension   . Hypoglycemia   . Hypokalemia   . Insomnia 06/15/2015  . Knee pain, chronic   . Malnutrition ([place])   . Non-healing  surgical wound 05/23/2014  . PEG (percutaneous endoscopic gastrostomy) status ([place])   . Renal failure, acute ([place])   . S/P radiation therapy 08/19/2013-10/15/2013   Right Tonstil and bilateral neck / 70 Gy in 35 fractions to gross disease, 63 Gy in 35 fractions to high risk nodal echelons, and 56 Gy in 35 fractions to intermediate risk nodal echelons  . Severe nausea and vomiting   . Status post chemotherapy    Only received 2 doses due to uncontrolled nausea and acute renal failure  . Tonsillar cancer ([place]) 07/09/13   SCCA of Right Tonsil, recurrent 2016    Past Surgical History:  Procedure Laterality Date  . LAPAROSCOPIC GASTROSTOMY N/A 08/15/2013    Procedure: LAPAROSCOPIC GASTROSTOMY TUBE PLACEMENT;  Surgeon: [name], MD;  Location: [place];  Service: General;  Laterality: N/A;  . LYMPH NODE BIOPSY  03/20/14   right neck  . MULTIPLE EXTRACTIONS WITH ALVEOLOPLASTY N/A 08/01/2013   Procedure: Extraction of tooth #'s 1,15,17,31, 32 with alveoloplasty, mandibular left torus reduction, and gross debridement of remaining teeth.;  Surgeon: [name], DDS;  Location: [place];  Service: Oral Surgery;  Laterality: N/A;  . PORT-A-CATH REMOVAL N/A 05/13/2014   Procedure: REMOVAL of PORT-A-CATH;  Surgeon: [name], MD;  Location: [facility];  Service: General;  Laterality: N/A;  . PORTACATH PLACEMENT Left 08/15/2013   Procedure: INSERTION PORT-A-CATH;  Surgeon: [name], MD;  Location: [place];  Service: General;  Laterality: Left;    Family History  Problem Relation Age of Onset  . Arthritis Mother   . Arthritis Father   . CVA Unknown   . Diabetes Unknown   . Heart attack Unknown     Social History   Socioeconomic History  . Marital status: Single    Spouse name: Not on file  . Number of children: 1  . Years of education: Not on file  . Highest education level: Not on file  Social Needs  . Financial resource strain: Not on file  . Food insecurity - worry: Not on file  . Food insecurity - inability: Not on file  . Transportation needs - medical: Not on file  . Transportation needs - non-medical: Not on file  Occupational History  . Not on file  Tobacco Use  . Smoking status: Former Smoker    Packs/day: 1.00    Years: 20.00    Pack years: 20.00    Types: Cigarettes    Last attempt to quit: 07/16/2003    Years since quitting: 13.9  . Smokeless tobacco: Never Used  Substance and Sexual Activity  . Alcohol use: No    Comment: none years ago  . Drug use: Yes    Types: Marijuana, Benzodiazepines    Comment: years ago  . Sexual activity: No  Other Topics Concern  . Not on file  Social History Narrative  . Not on  file    Past Medical History, Surgical history, Social history, and Family history were reviewed and updated as appropriate.   Please see review of systems for further details on the patient's review from today.   Review of Systems:  Review of Systems  HENT: Negative for trouble swallowing.   Eyes: Positive for visual disturbance.  Respiratory: Negative for cough, chest tightness, shortness of breath and wheezing.   Cardiovascular: Negative for chest pain and palpitations.  Gastrointestinal: Negative for nausea and vomiting.  Neurological: Negative for dizziness, speech difficulty, light-headedness and headaches.    Objective:   Physical Exam:  There were no vitals taken for this visit.  Physical Exam  Constitutional: No distress.  HENT:  Head: Normocephalic and atraumatic.  Cardiovascular: Normal rate, regular rhythm and normal heart sounds. Exam reveals no gallop and no friction rub.  No murmur heard. Pulmonary/Chest: Effort normal and breath sounds normal. No respiratory distress. He has no wheezes. He has no rales.  Neurological: He is alert.  Skin: Skin is warm and dry. No rash noted. He is not diaphoretic. No erythema.    Lab Review:     Component Value Date/Time   NA 141 06/15/2017 0747   NA 141 04/20/2017 1028   K 3.9 06/15/2017 0747   K 4.3 04/20/2017 1028   CL 106 06/15/2017 0747   CO2 24 06/15/2017 0747   CO2 26 04/20/2017 1028   GLUCOSE 91 06/15/2017 0747   GLUCOSE 96 04/20/2017 1028   BUN 15 06/15/2017 0747   BUN 16.7 04/20/2017 1028   CREATININE 1.54 (H) 06/15/2017 0747   CREATININE 2.0 (H) 04/20/2017 1028   CALCIUM 9.5 06/15/2017 0747   CALCIUM 9.0 04/20/2017 1028   PROT 7.2 06/15/2017 0747   PROT 7.0 04/20/2017 1028   ALBUMIN 3.9 06/15/2017 0747   ALBUMIN 4.0 04/20/2017 1028   AST 18 06/15/2017 0747   AST 20 04/20/2017 1028   ALT 14 06/15/2017 0747   ALT 17 04/20/2017 1028   ALKPHOS 64 06/15/2017 0747   ALKPHOS 61 04/20/2017 1028   BILITOT  0.8 06/15/2017 0747   BILITOT 0.49 04/20/2017 1028   GFRNONAA 51 (L) 06/15/2017 0747   GFRAA 60 (L) 06/15/2017 0747       Component Value Date/Time   WBC 7.0 06/15/2017 0747   RBC 4.21 06/15/2017 0747   HGB 12.2 (L) 06/15/2017 0747   HGB 12.8 (L) 04/20/2017 1028   HCT 38.7 06/15/2017 0747   HCT 40.0 04/20/2017 1028   PLT 226 06/15/2017 0747   PLT 187 04/20/2017 1028   MCV 91.9 06/15/2017 0747   MCV 92.6 04/20/2017 1028   MCH 29.0 06/15/2017 0747   MCHC 31.5 (L) 06/15/2017 0747   RDW 13.9 06/15/2017 0747   RDW 13.7 04/20/2017 1028   LYMPHSABS 0.9 06/15/2017 0747   LYMPHSABS 0.8 (L) 04/20/2017 1028   MONOABS 0.5 06/15/2017 0747   MONOABS 0.4 04/20/2017 1028   EOSABS 0.2 06/15/2017 0747   EOSABS 0.2 04/20/2017 1028   BASOSABS 0.0 06/15/2017 0747   BASOSABS 0.0 04/20/2017 1028   -------------------------------  Imaging from last 24 hours (if applicable):  Radiology interpretation: No results found.

## 2017-06-15 NOTE — Patient Instructions (Signed)
[facility] Discharge Instructions for Patients Receiving Chemotherapy  Today you received the following chemotherapy agents:  Keytruda.  To help prevent nausea and vomiting after your treatment, we encourage you to take your nausea medication as directed.   If you develop nausea and vomiting that is not controlled by your nausea medication, call the clinic.   BELOW ARE SYMPTOMS THAT SHOULD BE REPORTED IMMEDIATELY:  *FEVER GREATER THAN 100.5 F  *CHILLS WITH OR WITHOUT FEVER  NAUSEA AND VOMITING THAT IS NOT CONTROLLED WITH YOUR NAUSEA MEDICATION  *UNUSUAL SHORTNESS OF BREATH  *UNUSUAL BRUISING OR BLEEDING  TENDERNESS IN MOUTH AND THROAT WITH OR WITHOUT PRESENCE OF ULCERS  *URINARY PROBLEMS  *BOWEL PROBLEMS  UNUSUAL RASH Items with * indicate a potential emergency and should be followed up as soon as possible.  Feel free to call the clinic should you have any questions or concerns. The clinic phone number is [phone].  Please show the CHEMO ALERT CARD at check-in to the Emergency Department and triage nurse.    

## 2017-06-15 NOTE — Assessment & Plan Note (Signed)
Since recent dental extraction, his pain is well controlled He has completed all dental workup and hyperbaric oxygen therapy Ultimately, we agreed to keep current dose of oxycodone  I refilled his prescriptions today I warned him about risk of sedation and constipation

## 2017-06-15 NOTE — Progress Notes (Signed)
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 As long as his creatinine clearance remain greater than 30, we will continue chemotherapy Monitor only  Anemia in neoplastic disease This is likely anemia of chronic disease. The Mason [name] denies recent history of bleeding such as epistaxis, hematuria or hematochezia. He is asymptomatic from the anemia. We will observe for now.    Cancer associated pain Since recent dental extraction, his pain is well controlled He has completed all dental workup and hyperbaric oxygen therapy Ultimately, we agreed to keep current dose of oxycodone  I refilled his prescriptions today I warned him about risk of sedation and constipation   No orders of the defined types were placed in this encounter.   INTERVAL HISTORY: Please see below for problem oriented charting. 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 FNA of right level III lymph node was inconclusive for cancer      07/25/2013 Imaging    PET scan showed locally advanced disease with abnormal lymphadenopathy in the right axilla      08/06/2013 Procedure    CT-guided biopsy of the lymphadenopathy was negative for malignancy      08/15/2013 Surgery    Mason [name] has placement of port and feeding tube      08/19/2013 - 09/10/2013 Chemotherapy    Mason [name] received chemotherapy with cisplatin. The Mason [name] only received 2 doses due to uncontrolled nausea and acute renal failure.      08/19/2013 - 10/15/2013 Radiation Therapy    Received Helical IMRT Tomotherapy:  Right Tonstil and bilateral neck / 70 Gy in 35 fractions to gross disease, 63 Gy in 35 fractions to high risk nodal echelons, and 56 Gy in 35 fractions to intermediate risk nodal echelons.      08/27/2013 - 08/30/2013 Hospital Admission    The Mason [name] was admitted to the hospital for uncontrolled nausea vomiting and dehydration.      02/14/2014 Imaging    PET/CT scan showed complete response to treatment      03/19/2014 Surgery    He had excisional lymph node biopsy from the right neck. Pathology was benign      05/13/2014 Surgery  He had removal of Port-A-Cath.      05/22/2014 Imaging    Repeat CT scan of the neck show no evidence of disease recurrence.      12/09/2014 Imaging    Ct neck without contrast showed persistent abnormalities on the right side of neck, indeterminate      12/25/2014 Imaging    PET CT scan showed disease recurrence.      02/10/2015 Procedure    He has placement of port      02/13/2015 - 07/13/2015 Chemotherapy    He received chemotherapy with  carbo/Taxol      04/21/2015 Imaging    PET CT scan showed improved disease control      08/04/2015 Imaging    PET scan showed persistent disease      08/17/2015 -  Chemotherapy    He was started with [facility]      10/19/2015 Imaging    Ct neck showed mass-like intermediate density soft tissue at the right lateral neck recurrence site stable.       02/26/2016 Imaging    CT neck showed unchanged appearance of right neck recurrence compared to 10/19/2015 CT. No noncontrast evidence of new metastatic disease in the neck. Chronic sinusitis, progressed.      02/26/2016 Imaging    Diffuse but patchy and asymmetric partial airspace filling process (ground-glass opacity) in the lungs. This could be due to respiratory bronchiolitis, hypersensitivity pneumonitis or possible drug reaction. Atypical/viral pneumonia is also possible. Pulmonary consultation may be a helpful. A three-month follow-up noncontrast chest CT is suggested. Slight interval enlargement of mediastinal lymph nodes and a small lymph node along the left major fissure. This is most likely due to the inflammatory process in the lungs. No findings for metastatic disease involving the chest. No findings for upper abdominal metastatic disease.      02/29/2016 Adverse Reaction    His treatment is placed on hold due to possible hypersensitivity pneumonitis/drug reaction      04/14/2016 Imaging    Ct chest showed no evidence for metastatic disease within the chest. Significant interval improvement and near complete resolution of previously described diffuse bilateral predominately ground-glass pulmonary opacities, most compatible with resolving infectious/inflammatory process.      09/05/2016 Imaging    CT scan of neck and chest  1. Unchanged appearance of right neck recurrence. 2. No evidence of new metastatic disease in the neck. 3. Unremarkable and stable CT appearance of the chest. No findings suspicious for metastatic disease        REVIEW OF SYSTEMS:   Constitutional: Denies fevers, chills or abnormal weight loss Eyes: Denies blurriness of vision Ears, nose, mouth, throat, and face: Denies mucositis or sore throat Respiratory: Denies cough, dyspnea or wheezes Cardiovascular: Denies palpitation, chest discomfort or lower extremity swelling Gastrointestinal:  Denies nausea, heartburn or change in bowel habits Skin: Denies abnormal skin rashes Lymphatics: Denies new lymphadenopathy or easy bruising Neurological:Denies numbness, tingling or new weaknesses Behavioral/Psych: Mood is stable, no new changes  All other systems were reviewed with the Mason [name] and are negative.  I have reviewed the past medical history, past surgical history, social history and family history with the Mason [name] and they are unchanged from previous note.  ALLERGIES:  is allergic to pollen extract.  MEDICATIONS:  Current Outpatient Medications  Medication Sig Dispense Refill  . chlorhexidine (PERIDEX) 0.12 % solution Rinse with 15 mls twice daily for 30 seconds. Use after breakfast and at bedtime. Spit out excess. Do not swallow. 960 mL  prn  . fluticasone (FLONASE) 50 MCG/ACT nasal spray Place 2 sprays into both nostrils daily. 16 g 2  . levothyroxine (SYNTHROID, LEVOTHROID) 25 MCG tablet TAKE 1 TABLET BY MOUTH DAILY BEFORE BREAKFAST. 90 tablet 1  . lidocaine-prilocaine (EMLA) cream Apply 1 application topically as needed. 30 g 6  . Multiple Vitamin (MULTIVITAMIN WITH MINERALS) TABS tablet Take 1 tablet by mouth daily.    Marland Kitchen omeprazole (PRILOSEC) 20 MG capsule Take 1 capsule (20 mg total) by mouth daily. 30 capsule 0  . oxyCODONE (ROXICODONE) 15 MG immediate release tablet Take 1 tablet (15 mg total) by mouth every 8 (eight) hours as needed. 90 tablet 0  . traZODone (DESYREL) 150 MG tablet Take 1 tablet (150 mg total) by mouth at bedtime. 30 tablet 11   No current facility-administered medications for this visit.    Facility-Administered  Medications Ordered in Other Visits  Medication Dose Route Frequency Provider Last Rate Last Dose  . sodium chloride 0.9 % injection 10 mL  10 mL Intravenous PRN Alvy Bimler, Ajeenah Heiny, MD   10 mL at 11/29/16 0818    PHYSICAL EXAMINATION: ECOG PERFORMANCE STATUS: 1 - Symptomatic but completely ambulatory  Vitals:   06/15/17 0828  BP: 127/87  Pulse: (!) 101  Resp: 18  Temp: 97.9 F (36.6 C)  SpO2: 100%   Filed Weights   06/15/17 0828  Weight: 279 lb 11.2 oz (126.9 kg)    GENERAL:alert, no distress and comfortable SKIN: skin color, texture, turgor are normal, no rashes or significant lesions EYES: normal, Conjunctiva are pink and non-injected, sclera clear OROPHARYNX:no exudate, no erythema and lips, buccal mucosa, and tongue normal.  He has complete dental extraction.  Previously noted osteonecrosis of the jaw is stable NECK: Well-healed surgical scar  lYMPH:  no palpable lymphadenopathy in the cervical, axillary or inguinal LUNGS: clear to auscultation and percussion with normal breathing effort HEART: regular rate & rhythm and no murmurs and no lower extremity edema ABDOMEN:abdomen soft, non-tender and normal bowel sounds Musculoskeletal:no cyanosis of digits and no clubbing  NEURO: alert & oriented x 3 with fluent speech, no focal motor/sensory deficits  LABORATORY DATA:  I have reviewed the data as listed    Component Value Date/Time   NA 141 06/15/2017 0747   NA 141 04/20/2017 1028   K 3.9 06/15/2017 0747   K 4.3 04/20/2017 1028   CL 106 06/15/2017 0747   CO2 24 06/15/2017 0747   CO2 26 04/20/2017 1028   GLUCOSE 91 06/15/2017 0747   GLUCOSE 96 04/20/2017 1028   BUN 15 06/15/2017 0747   BUN 16.7 04/20/2017 1028   CREATININE 1.54 (H) 06/15/2017 0747   CREATININE 2.0 (H) 04/20/2017 1028   CALCIUM 9.5 06/15/2017 0747   CALCIUM 9.0 04/20/2017 1028   PROT 7.2 06/15/2017 0747   PROT 7.0 04/20/2017 1028   ALBUMIN 3.9 06/15/2017 0747   ALBUMIN 4.0 04/20/2017 1028   AST 18  06/15/2017 0747   AST 20 04/20/2017 1028   ALT 14 06/15/2017 0747   ALT 17 04/20/2017 1028   ALKPHOS 64 06/15/2017 0747   ALKPHOS 61 04/20/2017 1028   BILITOT 0.8 06/15/2017 0747   BILITOT 0.49 04/20/2017 1028   GFRNONAA 51 (L) 06/15/2017 0747   GFRAA 60 (L) 06/15/2017 0747    No results found for: SPEP, UPEP  Lab Results  Component Value Date   WBC 7.0 06/15/2017   NEUTROABS 5.5 06/15/2017   HGB 12.2 (L) 06/15/2017   HCT 38.7 06/15/2017   MCV  91.9 06/15/2017   PLT 226 06/15/2017      Chemistry      Component Value Date/Time   NA 141 06/15/2017 0747   NA 141 04/20/2017 1028   K 3.9 06/15/2017 0747   K 4.3 04/20/2017 1028   CL 106 06/15/2017 0747   CO2 24 06/15/2017 0747   CO2 26 04/20/2017 1028   BUN 15 06/15/2017 0747   BUN 16.7 04/20/2017 1028   CREATININE 1.54 (H) 06/15/2017 0747   CREATININE 2.0 (H) 04/20/2017 1028      Component Value Date/Time   CALCIUM 9.5 06/15/2017 0747   CALCIUM 9.0 04/20/2017 1028   ALKPHOS 64 06/15/2017 0747   ALKPHOS 61 04/20/2017 1028   AST 18 06/15/2017 0747   AST 20 04/20/2017 1028   ALT 14 06/15/2017 0747   ALT 17 04/20/2017 1028   BILITOT 0.8 06/15/2017 0747   BILITOT 0.49 04/20/2017 1028       All questions were answered. The Mason [name] knows to call the clinic with any problems, questions or concerns. No barriers to learning was detected.  I spent 15 minutes counseling the Mason [name] face to face. The total time spent in the appointment was 20 minutes and more than 50% was on counseling and review of test results  [name], MD 06/15/2017 9:00 AM

## 2017-06-15 NOTE — Progress Notes (Signed)
1015 Patient finished flush and preparing to de-access port. Patient stated he was feeling very fatigued and light headed. Blood pressure elevated, pulse elevated. Patient states he has only experienced this when he has not eaten anything. Lucianne Lei, PA called to evaluate.  BP rechecked 166/102 VO given for 0.1 Clonidine STAT Patient observed and BP rechecked- see flowsheet.  Patient reports symptoms have resolved. Per Lucianne Lei, [place] ok to discharge patient.   Patient verbalized an understanding to monitor for return of symptoms and call this office or report to ED.

## 2017-06-15 NOTE — Assessment & Plan Note (Signed)
His kidney function is stable. As long as his creatinine clearance remain greater than 30, we will continue chemotherapy Monitor only

## 2017-06-15 NOTE — Assessment & Plan Note (Signed)
This is likely anemia of chronic disease. The patient denies recent history of bleeding such as epistaxis, hematuria or hematochezia. He is asymptomatic from the anemia. We will observe for now.  

## 2017-07-13 ENCOUNTER — Ambulatory Visit (HOSPITAL_COMMUNITY)
Admission: RE | Admit: 2017-07-13 | Discharge: 2017-07-13 | Disposition: A | Discharge status: Home / Self Care | Payer: Medicaid Other | Source: Ambulatory Visit | Attending: Hematology and Oncology | Admitting: Hematology and Oncology

## 2017-07-13 ENCOUNTER — Telehealth: Payer: Self-pay

## 2017-07-13 ENCOUNTER — Encounter: Payer: Self-pay | Admitting: Hematology and Oncology

## 2017-07-13 ENCOUNTER — Inpatient Hospital Stay: Payer: Medicaid Other | Attending: Hematology and Oncology

## 2017-07-13 ENCOUNTER — Inpatient Hospital Stay (HOSPITAL_BASED_OUTPATIENT_CLINIC_OR_DEPARTMENT_OTHER): Payer: Medicaid Other | Admitting: Hematology and Oncology

## 2017-07-13 ENCOUNTER — Inpatient Hospital Stay: Payer: Medicaid Other

## 2017-07-13 ENCOUNTER — Other Ambulatory Visit: Payer: Self-pay

## 2017-07-13 VITALS — BP 125/74 | HR 88 | Temp 98.3°F | Resp 18 | Ht 74.0 in | Wt 277.8 lb

## 2017-07-13 DIAGNOSIS — R0600 Dyspnea, unspecified: Secondary | ICD-10-CM

## 2017-07-13 DIAGNOSIS — R0602 Shortness of breath: Secondary | ICD-10-CM | POA: Insufficient documentation

## 2017-07-13 DIAGNOSIS — R059 Cough, unspecified: Secondary | ICD-10-CM

## 2017-07-13 DIAGNOSIS — R0989 Other specified symptoms and signs involving the circulatory and respiratory systems: Secondary | ICD-10-CM | POA: Diagnosis not present

## 2017-07-13 DIAGNOSIS — C099 Malignant neoplasm of tonsil, unspecified: Secondary | ICD-10-CM

## 2017-07-13 DIAGNOSIS — R05 Cough: Secondary | ICD-10-CM | POA: Insufficient documentation

## 2017-07-13 DIAGNOSIS — Z5111 Encounter for antineoplastic chemotherapy: Secondary | ICD-10-CM

## 2017-07-13 DIAGNOSIS — E039 Hypothyroidism, unspecified: Secondary | ICD-10-CM

## 2017-07-13 DIAGNOSIS — R0689 Other abnormalities of breathing: Secondary | ICD-10-CM

## 2017-07-13 DIAGNOSIS — J90 Pleural effusion, not elsewhere classified: Secondary | ICD-10-CM | POA: Diagnosis not present

## 2017-07-13 DIAGNOSIS — R0789 Other chest pain: Secondary | ICD-10-CM

## 2017-07-13 LAB — CBC WITH DIFFERENTIAL/PLATELET
BASOS ABS: 0 10*3/uL (ref 0.0–0.1)
BASOS PCT: 1 %
EOS ABS: 0.1 10*3/uL (ref 0.0–0.5)
EOS PCT: 2 %
HCT: 35.7 % — ABNORMAL LOW (ref 38.4–49.9)
Hemoglobin: 11.7 g/dL — ABNORMAL LOW (ref 13.0–17.1)
LYMPHS PCT: 8 %
Lymphs Abs: 0.5 10*3/uL — ABNORMAL LOW (ref 0.9–3.3)
MCH: 29.4 pg (ref 27.2–33.4)
MCHC: 32.7 g/dL (ref 32.0–36.0)
MCV: 89.9 fL (ref 79.3–98.0)
Monocytes Absolute: 0.5 10*3/uL (ref 0.1–0.9)
Monocytes Relative: 7 %
Neutro Abs: 5.4 10*3/uL (ref 1.5–6.5)
Neutrophils Relative %: 82 %
PLATELETS: 216 10*3/uL (ref 140–400)
RBC: 3.97 MIL/uL — AB (ref 4.20–5.82)
RDW: 14.9 % — AB (ref 11.0–14.6)
WBC: 6.5 10*3/uL (ref 4.0–10.3)

## 2017-07-13 LAB — COMPREHENSIVE METABOLIC PANEL
ALK PHOS: 62 U/L (ref 40–150)
ALT: 20 U/L (ref 0–55)
AST: 20 U/L (ref 5–34)
Albumin: 3.9 g/dL (ref 3.5–5.0)
Anion gap: 7 (ref 3–11)
BILIRUBIN TOTAL: 0.8 mg/dL (ref 0.2–1.2)
BUN: 14 mg/dL (ref 7–26)
CALCIUM: 9.1 mg/dL (ref 8.4–10.4)
CO2: 27 mmol/L (ref 22–29)
Chloride: 107 mmol/L (ref 98–109)
Creatinine, Ser: 1.69 mg/dL — ABNORMAL HIGH (ref 0.70–1.30)
GFR calc Af Amer: 53 mL/min — ABNORMAL LOW (ref >60)
GFR calc non Af Amer: 46 mL/min — ABNORMAL LOW (ref >60)
Glucose, Bld: 77 mg/dL (ref 70–140)
POTASSIUM: 4.2 mmol/L (ref 3.5–5.1)
Sodium: 141 mmol/L (ref 136–145)
Total Protein: 7 g/dL (ref 6.4–8.3)

## 2017-07-13 LAB — TSH: TSH: 4.941 u[IU]/mL — ABNORMAL HIGH (ref 0.320–4.118)

## 2017-07-13 MED ORDER — SODIUM CHLORIDE 0.9 % IJ SOLN
10.0000 mL | INTRAMUSCULAR | Status: DC | PRN
  Administered 2017-07-13: 10 mL via INTRAVENOUS
  Filled 2017-07-13: qty 10

## 2017-07-13 MED ORDER — FUROSEMIDE 40 MG PO TABS: 40.0000 mg | ORAL_TABLET | Freq: Every day | ORAL | 0 refills | Status: DC

## 2017-07-13 MED ORDER — OXYCODONE HCL 15 MG PO TABS: 15.0000 mg | ORAL_TABLET | Freq: Three times a day (TID) | ORAL | 0 refills | Status: DC | PRN

## 2017-07-13 MED FILL — oxyCODONE HCL 15 MG TABS: 15 | 30 days supply | Qty: 90 | Fill #0

## 2017-07-13 MED FILL — FUROSEMIDE 40 MG TAB: 40 | 14 days supply | Qty: 14 | Fill #0

## 2017-07-13 NOTE — Assessment & Plan Note (Addendum)
His chest exam is not normal Chest x-ray showed possible pulmonary edema I recommend short-term Lasix therapy I plan to order CT scan of the chest for further evaluation I will cancel his treatment today I have spent some time filling out disability parking permit for him

## 2017-07-13 NOTE — Assessment & Plan Note (Signed)
He has chronic cough for almost a month with shortness of breath on exertion Examination review respiratory wheezes and congestion I have ordered a stat chest x-ray which revealed possible pulmonary edema We discussed modification of diet with reduced salt intake I am concerned whether this could represent metastatic disease in his lungs I would proceed with CT imaging for further follow-up

## 2017-07-13 NOTE — Assessment & Plan Note (Signed)
He has not been feeling well with respiratory congestion and shortness of breath I will hold treatment today pending further workup

## 2017-07-13 NOTE — Progress Notes (Signed)
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 with respiratory congestion and shortness of breath I will hold treatment today pending further workup  Cough He has chronic cough for almost a month with shortness of breath on exertion Examination review respiratory wheezes and congestion I have ordered a stat chest x-ray which revealed possible pulmonary edema We discussed modification of diet with reduced salt intake I am concerned whether this could represent metastatic disease in his lungs I would proceed with CT imaging for further follow-up  Dyspnea and respiratory abnormalities His chest exam is not normal Chest x-ray showed possible pulmonary edema I recommend short-term Lasix therapy I plan to order CT scan of the chest for further evaluation I will cancel his treatment today I have spent some time filling out disability parking permit for him   Orders Placed This Encounter  Procedures  . CT CHEST W CONTRAST    Standing Status:   Future    Standing Expiration Date:   07/14/2018    Order Specific Question:   If indicated for the ordered procedure, I authorize the administration of contrast media per Radiology protocol    Answer:   Yes    Order Specific Question:   Preferred imaging location?    Answer:   [facility]    Order Specific Question:   Radiology Contrast Protocol - do NOT remove file path    Answer:   [email or web address]  . DG Chest 2 View    Standing Status:   Future    Number of  Occurrences:   1    Standing Expiration Date:   08/17/2018    Order Specific Question:   Reason for exam:    Answer:   cough, SOB    Order Specific Question:   Preferred imaging location?    Answer:   [facility]    INTERVAL HISTORY: Please see below for problem oriented charting. He is seen prior to chemotherapy He has not been feeling well He has shortness of breath on minimal exertion with associated cough He denies recent smoking No recent fever He denies new lymphadenopathy He might have dysphagia recently and aspiration could not be excluded He denies orthopnea Denies pleuritic chest pain  SUMMARY OF ONCOLOGIC HISTORY: Oncology History   Tonsillar cancer, HPV positive   Primary site: Pharynx - Oropharynx (Right)   Staging method: AJCC 7th Edition   Clinical: Stage IVA (T2, N2b, M0) signed by [name], MD on 08/19/2013  1:24 PM   Summary: Stage IVA (T2, N2b, M0)       Tonsillar cancer ([place])   07/09/2013 Procedure    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 FNA of right level III lymph node was inconclusive for cancer      07/25/2013 Imaging    PET scan showed locally advanced disease with abnormal lymphadenopathy in the right axilla      08/06/2013 Procedure    CT-guided biopsy of the lymphadenopathy was negative for malignancy      08/15/2013 Surgery    Patient has placement of port and  feeding tube      08/19/2013 - 09/10/2013 Chemotherapy    Patient received chemotherapy with cisplatin. The patient only received 2 doses due to uncontrolled nausea and acute renal failure.      08/19/2013 - 10/15/2013 Radiation Therapy    Received Helical IMRT Tomotherapy:  Right Tonstil and bilateral neck / 70 Gy in 35 fractions to gross disease, 63 Gy in 35 fractions to high risk nodal echelons, and 56 Gy in 35 fractions to intermediate risk nodal echelons.      08/27/2013 - 08/30/2013 Hospital Admission    The patient was  admitted to the hospital for uncontrolled nausea vomiting and dehydration.      02/14/2014 Imaging    PET/CT scan showed complete response to treatment      03/19/2014 Surgery    He had excisional lymph node biopsy from the right neck. Pathology was benign      05/13/2014 Surgery    He had removal of Port-A-Cath.      05/22/2014 Imaging    Repeat CT scan of the neck show no evidence of disease recurrence.      12/09/2014 Imaging    Ct neck without contrast showed persistent abnormalities on the right side of neck, indeterminate      12/25/2014 Imaging    PET CT scan showed disease recurrence.      02/10/2015 Procedure    He has placement of port      02/13/2015 - 07/13/2015 Chemotherapy    He received chemotherapy with carbo/Taxol      04/21/2015 Imaging    PET CT scan showed improved disease control      08/04/2015 Imaging    PET scan showed persistent disease      08/17/2015 -  Chemotherapy    He was started with [facility]      10/19/2015 Imaging    Ct neck showed mass-like intermediate density soft tissue at the right lateral neck recurrence site stable.       02/26/2016 Imaging    CT neck showed unchanged appearance of right neck recurrence compared to 10/19/2015 CT. No noncontrast evidence of new metastatic disease in the neck. Chronic sinusitis, progressed.      02/26/2016 Imaging    Diffuse but patchy and asymmetric partial airspace filling process (ground-glass opacity) in the lungs. This could be due to respiratory bronchiolitis, hypersensitivity pneumonitis or possible drug reaction. Atypical/viral pneumonia is also possible. Pulmonary consultation may be a helpful. A three-month follow-up noncontrast chest CT is suggested. Slight interval enlargement of mediastinal lymph nodes and a small lymph node along the left major fissure. This is most likely due to the inflammatory process in the lungs. No findings for metastatic disease involving the chest. No findings for  upper abdominal metastatic disease.      02/29/2016 Adverse Reaction    His treatment is placed on hold due to possible hypersensitivity pneumonitis/drug reaction      04/14/2016 Imaging    Ct chest showed no evidence for metastatic disease within the chest. Significant interval improvement and near complete resolution of previously described diffuse bilateral predominately ground-glass pulmonary opacities, most compatible with resolving infectious/inflammatory process.      09/05/2016 Imaging    CT scan of neck and chest  1. Unchanged appearance of right neck recurrence. 2. No evidence of new metastatic disease in the neck. 3. Unremarkable and stable CT appearance of the chest. No findings suspicious for metastatic disease       REVIEW OF SYSTEMS:  Constitutional: Denies fevers, chills or abnormal weight loss Eyes: Denies blurriness of vision Ears, nose, mouth, throat, and face: Denies mucositis or sore throat Respiratory: Denies cough, dyspnea or wheezes Cardiovascular: Denies palpitation, chest discomfort or lower extremity swelling Gastrointestinal:  Denies nausea, heartburn or change in bowel habits Skin: Denies abnormal skin rashes Lymphatics: Denies new lymphadenopathy or easy bruising Neurological:Denies numbness, tingling or new weaknesses Behavioral/Psych: Mood is stable, no new changes  All other systems were reviewed with the patient and are negative.  I have reviewed the past medical history, past surgical history, social history and family history with the patient and they are unchanged from previous note.  ALLERGIES:  is allergic to pollen extract.  MEDICATIONS:  Current Outpatient Medications  Medication Sig Dispense Refill  . chlorhexidine (PERIDEX) 0.12 % solution Rinse with 15 mls twice daily for 30 seconds. Use after breakfast and at bedtime. Spit out excess. Do not swallow. 960 mL prn  . fluticasone (FLONASE) 50 MCG/ACT nasal spray Place 2 sprays into  both nostrils daily. 16 g 2  . furosemide (LASIX) 40 MG tablet Take 1 tablet (40 mg total) by mouth daily. Take before 2 pm daily 14 tablet 0  . levothyroxine (SYNTHROID, LEVOTHROID) 25 MCG tablet TAKE 1 TABLET BY MOUTH DAILY BEFORE BREAKFAST. 90 tablet 1  . lidocaine-prilocaine (EMLA) cream Apply 1 application topically as needed. 30 g 6  . Multiple Vitamin (MULTIVITAMIN WITH MINERALS) TABS tablet Take 1 tablet by mouth daily.    Marland Kitchen omeprazole (PRILOSEC) 20 MG capsule Take 1 capsule (20 mg total) by mouth daily. 30 capsule 0  . oxyCODONE (ROXICODONE) 15 MG immediate release tablet Take 1 tablet (15 mg total) by mouth every 8 (eight) hours as needed. 90 tablet 0  . traZODone (DESYREL) 150 MG tablet Take 1 tablet (150 mg total) by mouth at bedtime. 30 tablet 11   No current facility-administered medications for this visit.    Facility-Administered Medications Ordered in Other Visits  Medication Dose Route Frequency Provider Last Rate Last Dose  . sodium chloride 0.9 % injection 10 mL  10 mL Intravenous PRN Alvy Bimler, Brandye Inthavong, MD   10 mL at 11/29/16 0818    PHYSICAL EXAMINATION: ECOG PERFORMANCE STATUS: 1 - Symptomatic but completely ambulatory  Vitals:   07/13/17 0905  BP: 125/74  Pulse: 88  Resp: 18  Temp: 98.3 F (36.8 C)  SpO2: 100%   Filed Weights   07/13/17 0905  Weight: 277 lb 12.8 oz (126 kg)    GENERAL:alert, no distress and comfortable SKIN: skin color, texture, turgor are normal, no rashes or significant lesions EYES: normal, Conjunctiva are pink and non-injected, sclera clear OROPHARYNX:no exudate, no erythema and lips, buccal mucosa, and tongue normal  NECK: supple, thyroid normal size, non-tender, without nodularity LYMPH:  no palpable lymphadenopathy in the cervical, axillary or inguinal LUNGS: Noted increased breathing effort.  Diffuse expiratory wheezes with rhonchi HEART: regular rate & rhythm and no murmurs and no lower extremity edema ABDOMEN:abdomen soft,  non-tender and normal bowel sounds Musculoskeletal:no cyanosis of digits and no clubbing  NEURO: alert & oriented x 3 with fluent speech, no focal motor/sensory deficits  LABORATORY DATA:  I have reviewed the data as listed    Component Value Date/Time   NA 141 07/13/2017 0819   NA 141 04/20/2017 1028   K 4.2 07/13/2017 0819   K 4.3 04/20/2017 1028   CL 107 07/13/2017 0819   CO2 27 07/13/2017 0819   CO2 26 04/20/2017 1028  GLUCOSE 77 07/13/2017 0819   GLUCOSE 96 04/20/2017 1028   BUN 14 07/13/2017 0819   BUN 16.7 04/20/2017 1028   CREATININE 1.69 (H) 07/13/2017 0819   CREATININE 2.0 (H) 04/20/2017 1028   CALCIUM 9.1 07/13/2017 0819   CALCIUM 9.0 04/20/2017 1028   PROT 7.0 07/13/2017 0819   PROT 7.0 04/20/2017 1028   ALBUMIN 3.9 07/13/2017 0819   ALBUMIN 4.0 04/20/2017 1028   AST 20 07/13/2017 0819   AST 20 04/20/2017 1028   ALT 20 07/13/2017 0819   ALT 17 04/20/2017 1028   ALKPHOS 62 07/13/2017 0819   ALKPHOS 61 04/20/2017 1028   BILITOT 0.8 07/13/2017 0819   BILITOT 0.49 04/20/2017 1028   GFRNONAA 46 (L) 07/13/2017 0819   GFRAA 53 (L) 07/13/2017 0819    No results found for: SPEP, UPEP  Lab Results  Component Value Date   WBC 6.5 07/13/2017   NEUTROABS 5.4 07/13/2017   HGB 11.7 (L) 07/13/2017   HCT 35.7 (L) 07/13/2017   MCV 89.9 07/13/2017   PLT 216 07/13/2017      Chemistry      Component Value Date/Time   NA 141 07/13/2017 0819   NA 141 04/20/2017 1028   K 4.2 07/13/2017 0819   K 4.3 04/20/2017 1028   CL 107 07/13/2017 0819   CO2 27 07/13/2017 0819   CO2 26 04/20/2017 1028   BUN 14 07/13/2017 0819   BUN 16.7 04/20/2017 1028   CREATININE 1.69 (H) 07/13/2017 0819   CREATININE 2.0 (H) 04/20/2017 1028      Component Value Date/Time   CALCIUM 9.1 07/13/2017 0819   CALCIUM 9.0 04/20/2017 1028   ALKPHOS 62 07/13/2017 0819   ALKPHOS 61 04/20/2017 1028   AST 20 07/13/2017 0819   AST 20 04/20/2017 1028   ALT 20 07/13/2017 0819   ALT 17 04/20/2017  1028   BILITOT 0.8 07/13/2017 0819   BILITOT 0.49 04/20/2017 1028       RADIOGRAPHIC STUDIES: I have personally reviewed the radiological images as listed and agreed with the findings in the report. Dg Chest 2 View  Result Date: 07/13/2017 CLINICAL DATA:  Shortness of breath, chest pain, and weakness over the past 2 weeks. Possible infusion reaction today. EXAM: CHEST - 2 VIEW COMPARISON:  CT scan chest of Sep 05, 2016 FINDINGS: The lungs are adequately inflated. The interstitial markings are increased bilaterally but not greatly changed from a chest x-ray of August 15, 2013. There is no alveolar infiltrate. The heart is normal in size. The pulmonary vascularity is mildly prominent centrally. The power port catheter tip projects over the midportion of the SVC. There is a trace of pleural fluid blunting the posterior costophrenic angles. The bony thorax exhibits no acute abnormality. IMPRESSION: Mildly increased interstitial markings bilaterally are not entirely new but are more conspicuous today. There is no alveolar pneumonia. Tiny pleural effusions blunt the posterior costophrenic angles. Possible low-grade pulmonary edema without cardiac enlargement. Electronically Signed   By: David  Martinique M.D.   On: 07/13/2017 09:44    All questions were answered. The patient knows to call the clinic with any problems, questions or concerns. No barriers to learning was detected.  I spent 25 minutes counseling the patient face to face. The total time spent in the appointment was 30 minutes and more than 50% was on counseling and review of test results  [name], MD 07/13/2017 11:06 AM

## 2017-07-13 NOTE — Telephone Encounter (Signed)
Called per Dr. Alvy Bimler. Told chest xray showed no pneumonia, just congestion. Rx for Lasix sent to pharmacy, given instructions on how to take. Instructed to eat a diet with less salt and sodium. Verbalized understanding. Told him Dr. Alvy Bimler still wants to get a CT scan.

## 2017-07-19 ENCOUNTER — Telehealth: Payer: Self-pay

## 2017-07-19 ENCOUNTER — Ambulatory Visit (HOSPITAL_COMMUNITY): Payer: Medicaid Other

## 2017-07-19 NOTE — Telephone Encounter (Signed)
-----   Message from [name], MD sent at 07/19/2017  9:48 AM EDT ----- Regarding: CT chest approved Ct scan is approved (peer to peer done) Hassan Rowan, can you call radiologist. I am OK with or without contrast depending on radiologist protocol.   Reason for Ct is to rule out pneumonitis from chemo ----- Message ----- From: Elliot Gault Sent: 07/18/2017  10:40 AM To: [name], MD Subject: Denial                                         Good morning!  We are filling in for Tia today. We have a notice that [employer] has denied the request for CT chest for tomorrow. Would you like me to schedule a Peer-to-Peer?  If so what day and time would be good for you?  Do you want them to call your cell#?  If so, please provide that to me.  Thank you,  Velna Hatchet

## 2017-07-19 NOTE — Telephone Encounter (Signed)
Called CT and given below message.

## 2017-07-21 ENCOUNTER — Ambulatory Visit (HOSPITAL_COMMUNITY): Admission: RE | Admit: 2017-07-21 | Payer: Medicaid Other | Source: Ambulatory Visit

## 2017-07-21 ENCOUNTER — Other Ambulatory Visit: Payer: Self-pay | Admitting: Hematology and Oncology

## 2017-07-21 DIAGNOSIS — C099 Malignant neoplasm of tonsil, unspecified: Secondary | ICD-10-CM

## 2017-07-21 DIAGNOSIS — R05 Cough: Secondary | ICD-10-CM

## 2017-07-21 DIAGNOSIS — R0602 Shortness of breath: Secondary | ICD-10-CM

## 2017-07-21 DIAGNOSIS — R059 Cough, unspecified: Secondary | ICD-10-CM

## 2017-07-24 ENCOUNTER — Ambulatory Visit (HOSPITAL_COMMUNITY)
Admission: RE | Admit: 2017-07-24 | Discharge: 2017-07-24 | Disposition: A | Discharge status: Home / Self Care | Payer: Medicaid Other | Source: Ambulatory Visit | Attending: Hematology and Oncology | Admitting: Hematology and Oncology

## 2017-07-24 DIAGNOSIS — R918 Other nonspecific abnormal finding of lung field: Secondary | ICD-10-CM | POA: Diagnosis not present

## 2017-07-24 DIAGNOSIS — I700 Atherosclerosis of aorta: Secondary | ICD-10-CM | POA: Diagnosis not present

## 2017-07-24 DIAGNOSIS — R0602 Shortness of breath: Secondary | ICD-10-CM

## 2017-07-24 DIAGNOSIS — I2510 Atherosclerotic heart disease of native coronary artery without angina pectoris: Secondary | ICD-10-CM | POA: Insufficient documentation

## 2017-07-24 DIAGNOSIS — J432 Centrilobular emphysema: Secondary | ICD-10-CM | POA: Insufficient documentation

## 2017-07-27 ENCOUNTER — Encounter: Payer: Self-pay | Admitting: Hematology and Oncology

## 2017-07-27 ENCOUNTER — Inpatient Hospital Stay: Payer: Medicaid Other | Attending: Hematology and Oncology | Admitting: Hematology and Oncology

## 2017-07-27 ENCOUNTER — Telehealth: Payer: Self-pay

## 2017-07-27 ENCOUNTER — Telehealth: Payer: Self-pay | Admitting: Hematology and Oncology

## 2017-07-27 DIAGNOSIS — Z923 Personal history of irradiation: Secondary | ICD-10-CM | POA: Insufficient documentation

## 2017-07-27 DIAGNOSIS — J189 Pneumonia, unspecified organism: Secondary | ICD-10-CM

## 2017-07-27 DIAGNOSIS — J700 Acute pulmonary manifestations due to radiation: Secondary | ICD-10-CM

## 2017-07-27 DIAGNOSIS — G893 Neoplasm related pain (acute) (chronic): Secondary | ICD-10-CM | POA: Insufficient documentation

## 2017-07-27 DIAGNOSIS — R05 Cough: Secondary | ICD-10-CM | POA: Insufficient documentation

## 2017-07-27 DIAGNOSIS — Z9221 Personal history of antineoplastic chemotherapy: Secondary | ICD-10-CM | POA: Diagnosis not present

## 2017-07-27 DIAGNOSIS — C099 Malignant neoplasm of tonsil, unspecified: Secondary | ICD-10-CM | POA: Insufficient documentation

## 2017-07-27 DIAGNOSIS — Z452 Encounter for adjustment and management of vascular access device: Secondary | ICD-10-CM | POA: Insufficient documentation

## 2017-07-27 DIAGNOSIS — G4709 Other insomnia: Secondary | ICD-10-CM | POA: Diagnosis not present

## 2017-07-27 DIAGNOSIS — N182 Chronic kidney disease, stage 2 (mild): Secondary | ICD-10-CM | POA: Insufficient documentation

## 2017-07-27 DIAGNOSIS — T50905A Adverse effect of unspecified drugs, medicaments and biological substances, initial encounter: Secondary | ICD-10-CM

## 2017-07-27 MED ORDER — PREDNISONE 20 MG PO TABS: 20.0000 mg | ORAL_TABLET | Freq: Every day | ORAL | 0 refills | Status: DC

## 2017-07-27 MED FILL — predniSONE 20 MG TABS: 20 | 30 days supply | Qty: 30 | Fill #0

## 2017-07-27 NOTE — Assessment & Plan Note (Signed)
CT imaging showed no evidence of cancer recurrence However, he has developed pneumonitis related to side effects of treatment I recommend holding off treatment for at least 8 weeks before repeat another CT We also discussed potential stopping his treatment indefinitely If CT showed no evidence of pneumonitis and his symptoms resolve, we can discuss about potential chemotherapy holiday versus resumption of treatment

## 2017-07-27 NOTE — Assessment & Plan Note (Signed)
He has chronic insomnia I recommend trazodone only I do not recommend benzodiazepine due to high risk of dependency I reminded him that prednisone will likely cause insomnia as well but it will help him with the pneumonitis

## 2017-07-27 NOTE — Telephone Encounter (Signed)
Per 4/11 patient decline avs and calendar

## 2017-07-27 NOTE — Assessment & Plan Note (Addendum)
CT scan showed signs of pneumonitis Could be related to radiation recall versus drug induced pneumonitis In any case, we will hold his treatment for minimum 8 weeks, potentially longer depending on how long it would take him to improve I recommend low-dose prednisone therapy because he is having some wheezing and mild cough

## 2017-07-27 NOTE — Telephone Encounter (Signed)
Called regarding appt today at 1 pm. He forgot about the appt. He is in [place] now. He will arrive for appt before 1 pm today.

## 2017-07-27 NOTE — Progress Notes (Signed)
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 Monitor only  Drug-induced pneumonitis CT scan showed signs of pneumonitis Could be related to radiation recall versus drug induced pneumonitis In any case, we will hold his treatment for minimum 8 weeks, potentially longer depending on how long it would take him to improve I recommend low-dose prednisone therapy because he is having some wheezing and mild cough  Cancer associated pain He has chronic pain, stable It is not time to refill his prescription pain medicine I will schedule port flush in 2 weeks and we can refill his medications at that time  Insomnia He has chronic insomnia I recommend trazodone only I do not recommend benzodiazepine due to high risk of dependency I reminded him that prednisone will likely cause insomnia as well but it will help him with the pneumonitis    No orders of the defined types were placed in this encounter.   INTERVAL HISTORY: Please see below for problem oriented charting. 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 FNA of right level III lymph node was inconclusive for cancer      07/25/2013 Imaging    PET scan showed locally advanced disease with abnormal lymphadenopathy in the right axilla      08/06/2013 Procedure    CT-guided biopsy of the lymphadenopathy was negative for malignancy      08/15/2013 Surgery    Patient has placement of port and feeding tube      08/19/2013 - 09/10/2013 Chemotherapy    Patient received chemotherapy with cisplatin. The patient only received 2 doses due to uncontrolled nausea and acute renal failure.      08/19/2013 - 10/15/2013 Radiation Therapy    Received Helical IMRT Tomotherapy:  Right Tonstil and bilateral neck / 70 Gy in 35 fractions to gross disease, 63 Gy in 35 fractions to high risk nodal echelons, and 56 Gy in 35 fractions to intermediate risk nodal echelons.  08/27/2013 - 08/30/2013 Hospital Admission    The patient was admitted to the hospital for uncontrolled nausea vomiting and dehydration.      02/14/2014 Imaging    PET/CT scan showed complete response to treatment      03/19/2014 Surgery    He had excisional lymph node biopsy from the right neck. Pathology was benign      05/13/2014 Surgery    He had removal of Port-A-Cath.       05/22/2014 Imaging    Repeat CT scan of the neck show no evidence of disease recurrence.      12/09/2014 Imaging    Ct neck without contrast showed persistent abnormalities on the right side of neck, indeterminate      12/25/2014 Imaging    PET CT scan showed disease recurrence.      02/10/2015 Procedure    He has placement of port      02/13/2015 - 07/13/2015 Chemotherapy    He received chemotherapy with carbo/Taxol      04/21/2015 Imaging    PET CT scan showed improved disease control      08/04/2015 Imaging    PET scan showed persistent disease      08/17/2015 -  Chemotherapy    He was started with [facility]      10/19/2015 Imaging    Ct neck showed mass-like intermediate density soft tissue at the right lateral neck recurrence site stable.       02/26/2016 Imaging    CT neck showed unchanged appearance of right neck recurrence compared to 10/19/2015 CT. No noncontrast evidence of new metastatic disease in the neck. Chronic sinusitis, progressed.      02/26/2016 Imaging    Diffuse but patchy and asymmetric partial airspace filling process (ground-glass opacity) in the lungs. This could be due to respiratory bronchiolitis, hypersensitivity pneumonitis or possible drug reaction. Atypical/viral pneumonia is also possible. Pulmonary consultation may be a helpful. A three-month follow-up noncontrast chest CT is suggested. Slight interval enlargement of mediastinal lymph nodes and a small lymph node along the left major fissure. This is most likely due to the inflammatory process in the lungs. No findings for metastatic disease involving the chest. No findings for upper abdominal metastatic disease.      02/29/2016 Adverse Reaction    His treatment is placed on hold due to possible hypersensitivity pneumonitis/drug reaction      04/14/2016 Imaging    Ct chest showed no evidence for metastatic disease within the chest. Significant interval improvement and near complete resolution  of previously described diffuse bilateral predominately ground-glass pulmonary opacities, most compatible with resolving infectious/inflammatory process.      09/05/2016 Imaging    CT scan of neck and chest  1. Unchanged appearance of right neck recurrence. 2. No evidence of new metastatic disease in the neck. 3. Unremarkable and stable CT appearance of the chest. No findings suspicious for metastatic disease      07/24/2017 Imaging    1. No findings suspicious for metastatic disease in the chest. 2. No acute consolidative airspace disease to suggest a pneumonia. 3. No appreciable change in chronic mild patchy upper lung predominant centrilobular micronodularity. If the patient is a current smoker, these findings are most compatible with smoking related interstitial lung disease. If the patient is not a current smoker, these findings suggest subacute hypersensitivity pneumonitis  or postinflammatory change. 4. Stable mild biapical radiation fibrosis. 5. Mild to moderate centrilobular emphysema with diffuse bronchial wall thickening, suggesting COPD. 6. One vessel  coronary atherosclerosis.  Aortic Atherosclerosis (ICD10-I70.0) and Emphysema (ICD10-J43.9).       REVIEW OF SYSTEMS:   Constitutional: Denies fevers, chills or abnormal weight loss Eyes: Denies blurriness of vision Ears, nose, mouth, throat, and face: Denies mucositis or sore throat Respiratory: Denies cough, dyspnea or wheezes Cardiovascular: Denies palpitation, chest discomfort or lower extremity swelling Gastrointestinal:  Denies nausea, heartburn or change in bowel habits Skin: Denies abnormal skin rashes Lymphatics: Denies new lymphadenopathy or easy bruising Neurological:Denies numbness, tingling or new weaknesses Behavioral/Psych: Mood is stable, no new changes  All other systems were reviewed with the patient and are negative.  I have reviewed the past medical history, past surgical history, social history and  family history with the patient and they are unchanged from previous note.  ALLERGIES:  is allergic to pollen extract.  MEDICATIONS:  Current Outpatient Medications  Medication Sig Dispense Refill  . chlorhexidine (PERIDEX) 0.12 % solution Rinse with 15 mls twice daily for 30 seconds. Use after breakfast and at bedtime. Spit out excess. Do not swallow. 960 mL prn  . fluticasone (FLONASE) 50 MCG/ACT nasal spray Place 2 sprays into both nostrils daily. 16 g 2  . furosemide (LASIX) 40 MG tablet Take 1 tablet (40 mg total) by mouth daily. Take before 2 pm daily 14 tablet 0  . levothyroxine (SYNTHROID, LEVOTHROID) 25 MCG tablet TAKE 1 TABLET BY MOUTH DAILY BEFORE BREAKFAST. 90 tablet 1  . lidocaine-prilocaine (EMLA) cream Apply 1 application topically as needed. 30 g 6  . Multiple Vitamin (MULTIVITAMIN WITH MINERALS) TABS tablet Take 1 tablet by mouth daily.    Marland Kitchen omeprazole (PRILOSEC) 20 MG capsule Take 1 capsule (20 mg total) by mouth daily. 30 capsule 0  . oxyCODONE (ROXICODONE) 15 MG immediate release tablet Take 1 tablet (15 mg total) by mouth every 8 (eight) hours as needed. 90 tablet 0  . predniSONE (DELTASONE) 20 MG tablet Take 1 tablet (20 mg total) by mouth daily with breakfast. 30 tablet 0  . traZODone (DESYREL) 150 MG tablet Take 1 tablet (150 mg total) by mouth at bedtime. 30 tablet 11   No current facility-administered medications for this visit.    Facility-Administered Medications Ordered in Other Visits  Medication Dose Route Frequency Provider Last Rate Last Dose  . sodium chloride 0.9 % injection 10 mL  10 mL Intravenous PRN Alvy Bimler, Tawan Corkern, MD   10 mL at 11/29/16 0818    PHYSICAL EXAMINATION: ECOG PERFORMANCE STATUS: 1 - Symptomatic but completely ambulatory  Vitals:   07/27/17 1319  BP: 120/80  Pulse: 90  Resp: 18  Temp: 98.6 F (37 C)  SpO2: 98%   Filed Weights   07/27/17 1319  Weight: 271 lb 6.4 oz (123.1 kg)    GENERAL:alert, no distress and comfortable SKIN:  skin color, texture, turgor are normal, no rashes or significant lesions EYES: normal, Conjunctiva are pink and non-injected, sclera clear OROPHARYNX:no exudate, no erythema and lips, buccal mucosa, and tongue normal  NECK: supple, thyroid normal size, non-tender, without nodularity LYMPH:  no palpable lymphadenopathy in the cervical, axillary or inguinal LUNGS: Occasional scattered wheeze HEART: regular rate & rhythm and no murmurs and no lower extremity edema ABDOMEN:abdomen soft, non-tender and normal bowel sounds Musculoskeletal:no cyanosis of digits and no clubbing  NEURO: alert & oriented x 3 with fluent speech, no focal motor/sensory deficits  LABORATORY DATA:  I have reviewed the data as listed    Component Value Date/Time   NA 141 07/13/2017 0819   NA 141  04/20/2017 1028   K 4.2 07/13/2017 0819   K 4.3 04/20/2017 1028   CL 107 07/13/2017 0819   CO2 27 07/13/2017 0819   CO2 26 04/20/2017 1028   GLUCOSE 77 07/13/2017 0819   GLUCOSE 96 04/20/2017 1028   BUN 14 07/13/2017 0819   BUN 16.7 04/20/2017 1028   CREATININE 1.69 (H) 07/13/2017 0819   CREATININE 2.0 (H) 04/20/2017 1028   CALCIUM 9.1 07/13/2017 0819   CALCIUM 9.0 04/20/2017 1028   PROT 7.0 07/13/2017 0819   PROT 7.0 04/20/2017 1028   ALBUMIN 3.9 07/13/2017 0819   ALBUMIN 4.0 04/20/2017 1028   AST 20 07/13/2017 0819   AST 20 04/20/2017 1028   ALT 20 07/13/2017 0819   ALT 17 04/20/2017 1028   ALKPHOS 62 07/13/2017 0819   ALKPHOS 61 04/20/2017 1028   BILITOT 0.8 07/13/2017 0819   BILITOT 0.49 04/20/2017 1028   GFRNONAA 46 (L) 07/13/2017 0819   GFRAA 53 (L) 07/13/2017 0819    No results found for: SPEP, UPEP  Lab Results  Component Value Date   WBC 6.5 07/13/2017   NEUTROABS 5.4 07/13/2017   HGB 11.7 (L) 07/13/2017   HCT 35.7 (L) 07/13/2017   MCV 89.9 07/13/2017   PLT 216 07/13/2017      Chemistry      Component Value Date/Time   NA 141 07/13/2017 0819   NA 141 04/20/2017 1028   K 4.2 07/13/2017  0819   K 4.3 04/20/2017 1028   CL 107 07/13/2017 0819   CO2 27 07/13/2017 0819   CO2 26 04/20/2017 1028   BUN 14 07/13/2017 0819   BUN 16.7 04/20/2017 1028   CREATININE 1.69 (H) 07/13/2017 0819   CREATININE 2.0 (H) 04/20/2017 1028      Component Value Date/Time   CALCIUM 9.1 07/13/2017 0819   CALCIUM 9.0 04/20/2017 1028   ALKPHOS 62 07/13/2017 0819   ALKPHOS 61 04/20/2017 1028   AST 20 07/13/2017 0819   AST 20 04/20/2017 1028   ALT 20 07/13/2017 0819   ALT 17 04/20/2017 1028   BILITOT 0.8 07/13/2017 0819   BILITOT 0.49 04/20/2017 1028       RADIOGRAPHIC STUDIES: I have personally reviewed the radiological images as listed and agreed with the findings in the report. Dg Chest 2 View  Result Date: 07/13/2017 CLINICAL DATA:  Shortness of breath, chest pain, and weakness over the past 2 weeks. Possible infusion reaction today. EXAM: CHEST - 2 VIEW COMPARISON:  CT scan chest of Sep 05, 2016 FINDINGS: The lungs are adequately inflated. The interstitial markings are increased bilaterally but not greatly changed from a chest x-ray of August 15, 2013. There is no alveolar infiltrate. The heart is normal in size. The pulmonary vascularity is mildly prominent centrally. The power port catheter tip projects over the midportion of the SVC. There is a trace of pleural fluid blunting the posterior costophrenic angles. The bony thorax exhibits no acute abnormality. IMPRESSION: Mildly increased interstitial markings bilaterally are not entirely new but are more conspicuous today. There is no alveolar pneumonia. Tiny pleural effusions blunt the posterior costophrenic angles. Possible low-grade pulmonary edema without cardiac enlargement. Electronically Signed   By: David  Martinique M.D.   On: 07/13/2017 09:44   Ct Chest Wo Contrast  Result Date: 07/24/2017 CLINICAL DATA:  Dyspnea. Rales on exam. Right tonsillar squamous cell carcinoma on chemotherapy. EXAM: CT CHEST WITHOUT CONTRAST TECHNIQUE: Multidetector  CT imaging of the chest was performed following the standard protocol without IV  contrast. COMPARISON:  07/13/2017 chest radiograph.  09/05/2016 chest CT FINDINGS: Cardiovascular: Normal heart size. No significant pericardial fluid/thickening. Right internal jugular MediPort terminates at the cavoatrial junction. Right coronary atherosclerosis. Great vessels are normal in course and caliber. Mediastinum/Nodes: No discrete thyroid nodules. Unremarkable esophagus. No pathologically enlarged axillary, mediastinal or gross hilar lymph nodes, noting limited sensitivity for the detection of hilar adenopathy on this noncontrast study. Lungs/Pleura: No pneumothorax. No pleural effusion. Mild-to-moderate centrilobular emphysema with diffuse bronchial wall thickening. No acute consolidative airspace disease or lung masses. There is mild patchy centrilobular predominantly ground-glass micronodularity in both lungs, upper lobe predominant, with a dominant 3 mm centrilobular right upper lobe nodule (series 7/image 42), unchanged since 09/05/2016 chest CT. No new significant pulmonary nodules. Stable sharply marginated mild ground-glass opacity and reticulation at the lung apices compatible with mild radiation fibrosis (series 5/image 74). Upper abdomen: Colonic diverticulosis. Musculoskeletal: No aggressive appearing focal osseous lesions. Mild gynecomastia slightly asymmetric to the left, unchanged. Marked thoracic spondylosis. IMPRESSION: 1. No findings suspicious for metastatic disease in the chest. 2. No acute consolidative airspace disease to suggest a pneumonia. 3. No appreciable change in chronic mild patchy upper lung predominant centrilobular micronodularity. If the patient is a current smoker, these findings are most compatible with smoking related interstitial lung disease. If the patient is not a current smoker, these findings suggest subacute hypersensitivity pneumonitis or postinflammatory change. 4. Stable mild  biapical radiation fibrosis. 5. Mild to moderate centrilobular emphysema with diffuse bronchial wall thickening, suggesting COPD. 6. One vessel coronary atherosclerosis. Aortic Atherosclerosis (ICD10-I70.0) and Emphysema (ICD10-J43.9). Electronically Signed   By: Ilona Sorrel M.D.   On: 07/24/2017 16:46    All questions were answered. The patient knows to call the clinic with any problems, questions or concerns. No barriers to learning was detected.  I spent 25 minutes counseling the patient face to face. The total time spent in the appointment was 30 minutes and more than 50% was on counseling and review of test results  [name], MD 07/27/2017 1:38 PM

## 2017-07-27 NOTE — Assessment & Plan Note (Addendum)
He has chronic pain, stable It is not time to refill his prescription pain medicine I will schedule port flush in 2 weeks and we can refill his medications at that time

## 2017-07-27 NOTE — Assessment & Plan Note (Signed)
His kidney function is stable. Monitor only

## 2017-08-10 ENCOUNTER — Inpatient Hospital Stay: Payer: Medicaid Other

## 2017-08-10 ENCOUNTER — Other Ambulatory Visit: Payer: Self-pay

## 2017-08-10 DIAGNOSIS — C099 Malignant neoplasm of tonsil, unspecified: Secondary | ICD-10-CM

## 2017-08-10 DIAGNOSIS — Z452 Encounter for adjustment and management of vascular access device: Secondary | ICD-10-CM | POA: Diagnosis not present

## 2017-08-10 MED ORDER — SODIUM CHLORIDE 0.9 % IJ SOLN
10.0000 mL | INTRAMUSCULAR | Status: DC | PRN
  Administered 2017-08-10: 10 mL via INTRAVENOUS
  Filled 2017-08-10: qty 10

## 2017-08-10 MED ORDER — HEPARIN SOD (PORK) LOCK FLUSH 100 UNIT/ML IV SOLN
500.0000 [IU] | Freq: Once | INTRAVENOUS | Status: AC | PRN
  Administered 2017-08-10: 500 [IU] via INTRAVENOUS
  Filled 2017-08-10: qty 5

## 2017-08-10 MED ORDER — PREDNISONE 20 MG PO TABS: 20.0000 mg | ORAL_TABLET | Freq: Every day | ORAL | 0 refills | Status: DC

## 2017-08-10 MED ORDER — OXYCODONE HCL 15 MG PO TABS: 15.0000 mg | ORAL_TABLET | Freq: Three times a day (TID) | ORAL | 0 refills | Status: DC | PRN

## 2017-08-10 MED FILL — LEVOTHYROXINE 25 MCG TABLET: 25 | 90 days supply | Qty: 90 | Fill #1

## 2017-08-10 MED FILL — oxyCODONE HCL 15 MG TABS: 15 | 30 days supply | Qty: 90 | Fill #0

## 2017-08-10 NOTE — Telephone Encounter (Signed)
Pt in the lobby to pick up his pain prescription and refill on his prednisone. Per Dr.Gorsuch last note, pt remains on prednisone d/t pneumonitis. Pt still experiencing cough. Reviewed with Dr.Magrinat, since Dr.Gorsuch out of the office today. Signed for pt pain medication refill and refilled prednisone.

## 2017-09-07 ENCOUNTER — Encounter: Payer: Self-pay | Admitting: Hematology and Oncology

## 2017-09-07 ENCOUNTER — Inpatient Hospital Stay: Payer: Medicaid Other | Attending: Hematology and Oncology | Admitting: Hematology and Oncology

## 2017-09-07 VITALS — BP 127/85 | HR 92 | Temp 98.9°F | Resp 18 | Ht 74.0 in | Wt 276.6 lb

## 2017-09-07 DIAGNOSIS — J189 Pneumonia, unspecified organism: Secondary | ICD-10-CM | POA: Insufficient documentation

## 2017-09-07 DIAGNOSIS — Z923 Personal history of irradiation: Secondary | ICD-10-CM | POA: Insufficient documentation

## 2017-09-07 DIAGNOSIS — Z79899 Other long term (current) drug therapy: Secondary | ICD-10-CM | POA: Diagnosis not present

## 2017-09-07 DIAGNOSIS — Z9221 Personal history of antineoplastic chemotherapy: Secondary | ICD-10-CM | POA: Diagnosis not present

## 2017-09-07 DIAGNOSIS — G893 Neoplasm related pain (acute) (chronic): Secondary | ICD-10-CM | POA: Insufficient documentation

## 2017-09-07 DIAGNOSIS — C099 Malignant neoplasm of tonsil, unspecified: Secondary | ICD-10-CM | POA: Insufficient documentation

## 2017-09-07 DIAGNOSIS — R059 Cough, unspecified: Secondary | ICD-10-CM

## 2017-09-07 DIAGNOSIS — R05 Cough: Secondary | ICD-10-CM

## 2017-09-07 DIAGNOSIS — J849 Interstitial pulmonary disease, unspecified: Secondary | ICD-10-CM

## 2017-09-07 DIAGNOSIS — R918 Other nonspecific abnormal finding of lung field: Secondary | ICD-10-CM

## 2017-09-07 DIAGNOSIS — R0602 Shortness of breath: Secondary | ICD-10-CM | POA: Diagnosis not present

## 2017-09-07 MED ORDER — PREDNISONE 20 MG PO TABS: 20.0000 mg | ORAL_TABLET | Freq: Every day | ORAL | 0 refills | Status: DC

## 2017-09-07 MED ORDER — OXYCODONE HCL 15 MG PO TABS: 15.0000 mg | ORAL_TABLET | Freq: Three times a day (TID) | ORAL | 0 refills | Status: DC | PRN

## 2017-09-07 MED FILL — oxyCODONE HCL 15 MG TABS: 15 | 30 days supply | Qty: 90 | Fill #0

## 2017-09-07 MED FILL — predniSONE 20 MG TABS: 20 | 30 days supply | Qty: 30 | Fill #0

## 2017-09-07 MED FILL — LIDOCAINE-PRILOCAINE CREAM: 2.5-2.5 | 10 days supply | Qty: 30 | Fill #1

## 2017-09-07 NOTE — Assessment & Plan Note (Signed)
CT imaging showed no evidence of cancer recurrence However, he has developed pneumonitis related to side effects of treatment I recommend holding off treatment  I plan to repeat CT imaging of the chest next week for further assessment and see him back early June for further follow-up

## 2017-09-07 NOTE — Assessment & Plan Note (Signed)
He has chronic pain, stable We discussed narcotic refill policy I refilled his prescription of pain medicine today

## 2017-09-07 NOTE — Assessment & Plan Note (Signed)
Examination review respiratory wheezes and congestion, mild improvement since prednisone Recent CT show pneumonitis Plan to repeat imaging study next week for further assessment For now, he will continue prednisone

## 2017-09-07 NOTE — Progress Notes (Signed)
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 However, he has developed pneumonitis related to side effects of treatment I recommend holding off treatment  I plan to repeat CT imaging of the chest next week for further assessment and see him back early June for further follow-up  Cough Examination review respiratory wheezes and congestion, mild improvement since prednisone Recent CT show pneumonitis Plan to repeat imaging study next week for further assessment For now, he will continue prednisone  Cancer associated pain He has chronic pain, stable We discussed narcotic refill policy I refilled his prescription of pain medicine today   Orders Placed This Encounter  Procedures  . CT Chest Wo Contrast    Standing Status:   Future    Standing Expiration Date:   09/07/2018    Order Specific Question:   Preferred imaging location?    Answer:   [facility]    Order Specific Question:   Radiology Contrast Protocol - do NOT remove file path    Answer:   [email or web address]    INTERVAL HISTORY: Please see below for problem oriented charting. 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 FNA of right level III lymph node was inconclusive for cancer      07/25/2013 Imaging    PET scan showed locally advanced disease with abnormal lymphadenopathy in the right axilla      08/06/2013 Procedure    CT-guided biopsy of the lymphadenopathy was negative for malignancy      08/15/2013 Surgery    Patient has placement of port and feeding tube      08/19/2013 - 09/10/2013 Chemotherapy    Patient received chemotherapy with cisplatin. The patient only received 2 doses due to uncontrolled nausea and acute renal failure.      08/19/2013 - 10/15/2013 Radiation Therapy    Received Helical IMRT Tomotherapy:  Right Tonstil and bilateral neck / 70 Gy in 35 fractions to gross disease, 63 Gy in 35 fractions to high risk nodal echelons, and 56 Gy in 35 fractions to intermediate risk nodal echelons.      08/27/2013 - 08/30/2013 Hospital Admission    The patient was admitted to the hospital for uncontrolled nausea vomiting and dehydration.      02/14/2014 Imaging    PET/CT scan showed complete response to treatment      03/19/2014 Surgery    He had excisional lymph node biopsy from the right neck. Pathology was benign      05/13/2014 Surgery  He had removal of Port-A-Cath.      05/22/2014 Imaging    Repeat CT scan of the neck show no evidence of disease recurrence.      12/09/2014 Imaging    Ct neck without contrast showed persistent abnormalities on the right side of neck, indeterminate      12/25/2014 Imaging    PET CT scan showed disease recurrence.      02/10/2015 Procedure    He has placement of port      02/13/2015 - 07/13/2015 Chemotherapy    He received chemotherapy with carbo/Taxol      04/21/2015 Imaging    PET CT scan showed improved disease  control      08/04/2015 Imaging    PET scan showed persistent disease      08/17/2015 -  Chemotherapy    He was started with [facility]      10/19/2015 Imaging    Ct neck showed mass-like intermediate density soft tissue at the right lateral neck recurrence site stable.       02/26/2016 Imaging    CT neck showed unchanged appearance of right neck recurrence compared to 10/19/2015 CT. No noncontrast evidence of new metastatic disease in the neck. Chronic sinusitis, progressed.      02/26/2016 Imaging    Diffuse but patchy and asymmetric partial airspace filling process (ground-glass opacity) in the lungs. This could be due to respiratory bronchiolitis, hypersensitivity pneumonitis or possible drug reaction. Atypical/viral pneumonia is also possible. Pulmonary consultation may be a helpful. A three-month follow-up noncontrast chest CT is suggested. Slight interval enlargement of mediastinal lymph nodes and a small lymph node along the left major fissure. This is most likely due to the inflammatory process in the lungs. No findings for metastatic disease involving the chest. No findings for upper abdominal metastatic disease.      02/29/2016 Adverse Reaction    His treatment is placed on hold due to possible hypersensitivity pneumonitis/drug reaction      04/14/2016 Imaging    Ct chest showed no evidence for metastatic disease within the chest. Significant interval improvement and near complete resolution of previously described diffuse bilateral predominately ground-glass pulmonary opacities, most compatible with resolving infectious/inflammatory process.      09/05/2016 Imaging    CT scan of neck and chest  1. Unchanged appearance of right neck recurrence. 2. No evidence of new metastatic disease in the neck. 3. Unremarkable and stable CT appearance of the chest. No findings suspicious for metastatic disease      07/24/2017 Imaging    1. No findings suspicious for metastatic disease in  the chest. 2. No acute consolidative airspace disease to suggest a pneumonia. 3. No appreciable change in chronic mild patchy upper lung predominant centrilobular micronodularity. If the patient is a current smoker, these findings are most compatible with smoking related interstitial lung disease. If the patient is not a current smoker, these findings suggest subacute hypersensitivity pneumonitis  or postinflammatory change. 4. Stable mild biapical radiation fibrosis. 5. Mild to moderate centrilobular emphysema with diffuse bronchial wall thickening, suggesting COPD. 6. One vessel coronary atherosclerosis.  Aortic Atherosclerosis (ICD10-I70.0) and Emphysema (ICD10-J43.9).       REVIEW OF SYSTEMS:   Constitutional: Denies fevers, chills or abnormal weight loss Eyes: Denies blurriness of vision Ears, nose, mouth, throat, and face: Denies mucositis or sore throat Respiratory: Denies cough, dyspnea or wheezes Cardiovascular: Denies palpitation, chest discomfort or lower extremity swelling Gastrointestinal:  Denies nausea, heartburn or change in bowel habits Skin: Denies  abnormal skin rashes Lymphatics: Denies new lymphadenopathy or easy bruising Neurological:Denies numbness, tingling or new weaknesses Behavioral/Psych: Mood is stable, no new changes  All other systems were reviewed with the patient and are negative.  I have reviewed the past medical history, past surgical history, social history and family history with the patient and they are unchanged from previous note.  ALLERGIES:  is allergic to pollen extract.  MEDICATIONS:  Current Outpatient Medications  Medication Sig Dispense Refill  . chlorhexidine (PERIDEX) 0.12 % solution Rinse with 15 mls twice daily for 30 seconds. Use after breakfast and at bedtime. Spit out excess. Do not swallow. 960 mL prn  . fluticasone (FLONASE) 50 MCG/ACT nasal spray Place 2 sprays into both nostrils daily. 16 g 2  . furosemide (LASIX) 40 MG tablet  Take 1 tablet (40 mg total) by mouth daily. Take before 2 pm daily 14 tablet 0  . levothyroxine (SYNTHROID, LEVOTHROID) 25 MCG tablet TAKE 1 TABLET BY MOUTH DAILY BEFORE BREAKFAST. 90 tablet 1  . lidocaine-prilocaine (EMLA) cream Apply 1 application topically as needed. 30 g 6  . Multiple Vitamin (MULTIVITAMIN WITH MINERALS) TABS tablet Take 1 tablet by mouth daily.    Marland Kitchen omeprazole (PRILOSEC) 20 MG capsule Take 1 capsule (20 mg total) by mouth daily. 30 capsule 0  . oxyCODONE (ROXICODONE) 15 MG immediate release tablet Take 1 tablet (15 mg total) by mouth every 8 (eight) hours as needed. 90 tablet 0  . predniSONE (DELTASONE) 20 MG tablet Take 1 tablet (20 mg total) by mouth daily with breakfast. 30 tablet 0  . traZODone (DESYREL) 150 MG tablet Take 1 tablet (150 mg total) by mouth at bedtime. 30 tablet 11   No current facility-administered medications for this visit.    Facility-Administered Medications Ordered in Other Visits  Medication Dose Route Frequency Provider Last Rate Last Dose  . sodium chloride 0.9 % injection 10 mL  10 mL Intravenous PRN Alvy Bimler, Annebelle Bostic, MD   10 mL at 11/29/16 0818    PHYSICAL EXAMINATION: ECOG PERFORMANCE STATUS: 1 - Symptomatic but completely ambulatory  Vitals:   09/07/17 1440  BP: 127/85  Pulse: 92  Resp: 18  Temp: 98.9 F (37.2 C)  SpO2: 100%   Filed Weights   09/07/17 1440  Weight: 276 lb 9.6 oz (125.5 kg)    GENERAL:alert, no distress and comfortable SKIN: skin color, texture, turgor are normal, no rashes or significant lesions EYES: normal, Conjunctiva are pink and non-injected, sclera clear OROPHARYNX:no exudate, no erythema and lips, buccal mucosa, and tongue normal  NECK: Well-healed surgical scar. LYMPH:  no palpable lymphadenopathy in the cervical, axillary or inguinal LUNGS: Noted mild expiratory wheezes with normal breathing effort  hEART: regular rate & rhythm and no murmurs and no lower extremity edema ABDOMEN:abdomen soft,  non-tender and normal bowel sounds Musculoskeletal:no cyanosis of digits and no clubbing  NEURO: alert & oriented x 3 with fluent speech, no focal motor/sensory deficits  LABORATORY DATA:  I have reviewed the data as listed    Component Value Date/Time   NA 141 07/13/2017 0819   NA 141 04/20/2017 1028   K 4.2 07/13/2017 0819   K 4.3 04/20/2017 1028   CL 107 07/13/2017 0819   CO2 27 07/13/2017 0819   CO2 26 04/20/2017 1028   GLUCOSE 77 07/13/2017 0819   GLUCOSE 96 04/20/2017 1028   BUN 14 07/13/2017 0819   BUN 16.7 04/20/2017 1028   CREATININE 1.69 (H) 07/13/2017 0819   CREATININE 2.0 (H) 04/20/2017  1028   CALCIUM 9.1 07/13/2017 0819   CALCIUM 9.0 04/20/2017 1028   PROT 7.0 07/13/2017 0819   PROT 7.0 04/20/2017 1028   ALBUMIN 3.9 07/13/2017 0819   ALBUMIN 4.0 04/20/2017 1028   AST 20 07/13/2017 0819   AST 20 04/20/2017 1028   ALT 20 07/13/2017 0819   ALT 17 04/20/2017 1028   ALKPHOS 62 07/13/2017 0819   ALKPHOS 61 04/20/2017 1028   BILITOT 0.8 07/13/2017 0819   BILITOT 0.49 04/20/2017 1028   GFRNONAA 46 (L) 07/13/2017 0819   GFRAA 53 (L) 07/13/2017 0819    No results found for: SPEP, UPEP  Lab Results  Component Value Date   WBC 6.5 07/13/2017   NEUTROABS 5.4 07/13/2017   HGB 11.7 (L) 07/13/2017   HCT 35.7 (L) 07/13/2017   MCV 89.9 07/13/2017   PLT 216 07/13/2017      Chemistry      Component Value Date/Time   NA 141 07/13/2017 0819   NA 141 04/20/2017 1028   K 4.2 07/13/2017 0819   K 4.3 04/20/2017 1028   CL 107 07/13/2017 0819   CO2 27 07/13/2017 0819   CO2 26 04/20/2017 1028   BUN 14 07/13/2017 0819   BUN 16.7 04/20/2017 1028   CREATININE 1.69 (H) 07/13/2017 0819   CREATININE 2.0 (H) 04/20/2017 1028      Component Value Date/Time   CALCIUM 9.1 07/13/2017 0819   CALCIUM 9.0 04/20/2017 1028   ALKPHOS 62 07/13/2017 0819   ALKPHOS 61 04/20/2017 1028   AST 20 07/13/2017 0819   AST 20 04/20/2017 1028   ALT 20 07/13/2017 0819   ALT 17 04/20/2017  1028   BILITOT 0.8 07/13/2017 0819   BILITOT 0.49 04/20/2017 1028      All questions were answered. The patient knows to call the clinic with any problems, questions or concerns. No barriers to learning was detected.  I spent 15 minutes counseling the patient face to face. The total time spent in the appointment was 20 minutes and more than 50% was on counseling and review of test results  [name], MD 09/07/2017 3:35 PM

## 2017-09-08 ENCOUNTER — Telehealth: Payer: Self-pay | Admitting: Hematology and Oncology

## 2017-09-08 NOTE — Telephone Encounter (Signed)
Mailed patient calendar of upcoming June appointments.

## 2017-09-15 ENCOUNTER — Ambulatory Visit (HOSPITAL_COMMUNITY)
Admission: RE | Admit: 2017-09-15 | Discharge: 2017-09-15 | Disposition: A | Discharge status: Home / Self Care | Payer: Medicaid Other | Source: Ambulatory Visit | Attending: Hematology and Oncology | Admitting: Hematology and Oncology

## 2017-09-15 DIAGNOSIS — R0602 Shortness of breath: Secondary | ICD-10-CM

## 2017-09-15 DIAGNOSIS — J849 Interstitial pulmonary disease, unspecified: Secondary | ICD-10-CM | POA: Diagnosis not present

## 2017-09-15 DIAGNOSIS — J439 Emphysema, unspecified: Secondary | ICD-10-CM | POA: Insufficient documentation

## 2017-09-15 DIAGNOSIS — C099 Malignant neoplasm of tonsil, unspecified: Secondary | ICD-10-CM | POA: Diagnosis not present

## 2017-09-18 ENCOUNTER — Inpatient Hospital Stay: Payer: Medicaid Other | Attending: Hematology and Oncology

## 2017-09-18 ENCOUNTER — Other Ambulatory Visit: Payer: Self-pay | Admitting: Hematology and Oncology

## 2017-09-18 ENCOUNTER — Encounter: Payer: Self-pay | Admitting: Hematology and Oncology

## 2017-09-18 ENCOUNTER — Inpatient Hospital Stay (HOSPITAL_BASED_OUTPATIENT_CLINIC_OR_DEPARTMENT_OTHER): Payer: Medicaid Other | Admitting: Hematology and Oncology

## 2017-09-18 ENCOUNTER — Inpatient Hospital Stay: Payer: Medicaid Other

## 2017-09-18 VITALS — BP 147/100 | HR 99 | Temp 98.9°F | Resp 18 | Ht 74.0 in | Wt 277.3 lb

## 2017-09-18 DIAGNOSIS — Z9221 Personal history of antineoplastic chemotherapy: Secondary | ICD-10-CM | POA: Insufficient documentation

## 2017-09-18 DIAGNOSIS — E039 Hypothyroidism, unspecified: Secondary | ICD-10-CM

## 2017-09-18 DIAGNOSIS — C099 Malignant neoplasm of tonsil, unspecified: Secondary | ICD-10-CM | POA: Insufficient documentation

## 2017-09-18 DIAGNOSIS — J439 Emphysema, unspecified: Secondary | ICD-10-CM | POA: Diagnosis not present

## 2017-09-18 DIAGNOSIS — Z79899 Other long term (current) drug therapy: Secondary | ICD-10-CM | POA: Diagnosis not present

## 2017-09-18 DIAGNOSIS — G893 Neoplasm related pain (acute) (chronic): Secondary | ICD-10-CM

## 2017-09-18 DIAGNOSIS — R6884 Jaw pain: Secondary | ICD-10-CM | POA: Insufficient documentation

## 2017-09-18 DIAGNOSIS — C090 Malignant neoplasm of tonsillar fossa: Secondary | ICD-10-CM

## 2017-09-18 DIAGNOSIS — Z87891 Personal history of nicotine dependence: Secondary | ICD-10-CM | POA: Diagnosis not present

## 2017-09-18 DIAGNOSIS — Z923 Personal history of irradiation: Secondary | ICD-10-CM | POA: Diagnosis not present

## 2017-09-18 DIAGNOSIS — R030 Elevated blood-pressure reading, without diagnosis of hypertension: Secondary | ICD-10-CM

## 2017-09-18 DIAGNOSIS — C770 Secondary and unspecified malignant neoplasm of lymph nodes of head, face and neck: Secondary | ICD-10-CM

## 2017-09-18 DIAGNOSIS — J432 Centrilobular emphysema: Secondary | ICD-10-CM

## 2017-09-18 LAB — CBC WITH DIFFERENTIAL/PLATELET
BASOS ABS: 0 10*3/uL (ref 0.0–0.1)
BASOS PCT: 0 %
EOS ABS: 0 10*3/uL (ref 0.0–0.5)
EOS PCT: 0 %
HCT: 40 % (ref 38.4–49.9)
Hemoglobin: 12.8 g/dL — ABNORMAL LOW (ref 13.0–17.1)
LYMPHS PCT: 3 %
Lymphs Abs: 0.4 10*3/uL — ABNORMAL LOW (ref 0.9–3.3)
MCH: 28.9 pg (ref 27.2–33.4)
MCHC: 32 g/dL (ref 32.0–36.0)
MCV: 90.5 fL (ref 79.3–98.0)
MONO ABS: 0.4 10*3/uL (ref 0.1–0.9)
Monocytes Relative: 4 %
Neutro Abs: 10.9 10*3/uL — ABNORMAL HIGH (ref 1.5–6.5)
Neutrophils Relative %: 93 %
Platelets: 208 10*3/uL (ref 140–400)
RBC: 4.42 MIL/uL (ref 4.20–5.82)
RDW: 16.7 % — AB (ref 11.0–14.6)
WBC: 11.7 10*3/uL — AB (ref 4.0–10.3)

## 2017-09-18 LAB — TSH: TSH: 2.568 u[IU]/mL (ref 0.320–4.118)

## 2017-09-18 LAB — COMPREHENSIVE METABOLIC PANEL
ALBUMIN: 4.1 g/dL (ref 3.5–5.0)
ALT: 44 U/L (ref 0–55)
AST: 27 U/L (ref 5–34)
Alkaline Phosphatase: 56 U/L (ref 40–150)
Anion gap: 9 (ref 3–11)
BUN: 16 mg/dL (ref 7–26)
CHLORIDE: 105 mmol/L (ref 98–109)
CO2: 26 mmol/L (ref 22–29)
CREATININE: 1.63 mg/dL — AB (ref 0.70–1.30)
Calcium: 9 mg/dL (ref 8.4–10.4)
GFR calc Af Amer: 55 mL/min — ABNORMAL LOW (ref >60)
GFR calc non Af Amer: 48 mL/min — ABNORMAL LOW (ref >60)
Glucose, Bld: 100 mg/dL (ref 70–140)
POTASSIUM: 4.1 mmol/L (ref 3.5–5.1)
SODIUM: 140 mmol/L (ref 136–145)
Total Bilirubin: 0.5 mg/dL (ref 0.2–1.2)
Total Protein: 6.9 g/dL (ref 6.4–8.3)

## 2017-09-18 MED ORDER — SODIUM CHLORIDE 0.9 % IJ SOLN
10.0000 mL | INTRAMUSCULAR | Status: DC | PRN
  Administered 2017-09-18: 10 mL via INTRAVENOUS
  Filled 2017-09-18: qty 10

## 2017-09-18 MED ORDER — HEPARIN SOD (PORK) LOCK FLUSH 100 UNIT/ML IV SOLN
500.0000 [IU] | Freq: Once | INTRAVENOUS | Status: AC | PRN
  Administered 2017-09-18: 500 [IU] via INTRAVENOUS
  Filled 2017-09-18: qty 5

## 2017-09-18 NOTE — Assessment & Plan Note (Signed)
Clinically, I could not detect any signs of persistent cancer Nevertheless, he is at risk of recurrence I recommend we keep the port patent for now I plan to see him back in 6 weeks for further follow-up

## 2017-09-18 NOTE — Assessment & Plan Note (Signed)
I have reviewed the CT imaging It showed evidence of emphysema The patient himself does not smoke now (he used to smoke) but people around him does smoke Prednisone is not making a difference to his symptoms I recommend a course of prednisone taper I recommend pulmonology evaluation to exclude drug-induced pneumonitis I will continue to put his immunotherapy on hold

## 2017-09-18 NOTE — Assessment & Plan Note (Signed)
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 

## 2017-09-18 NOTE — Progress Notes (Signed)
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 Nevertheless, he is at risk of recurrence I recommend we keep the port patent for now I plan to see him back in 6 weeks for further follow-up  Emphysema of lung ([place]) I have reviewed the CT imaging It showed evidence of emphysema The patient himself does not smoke now (he used to smoke) but people around him does smoke Prednisone is not making a difference to his symptoms I recommend a course of prednisone taper I recommend pulmonology evaluation to exclude drug-induced pneumonitis I will continue to put his immunotherapy on hold  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 Ambulatory referral to Pulmonology    Referral Priority:   Urgent    Referral Type:   Consultation    Referral Reason:   Specialty Services Required    Requested Specialty:   Pulmonary Disease    Number of Visits Requested:   1    INTERVAL HISTORY: Please see below for problem oriented charting. He returns for further follow-up He continues to have mild shortness of breath and productive cough in the morning He denies fever or chills Denies worsening jaw pain.  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 FNA of right level III lymph node was inconclusive for cancer      07/25/2013 Imaging    PET scan showed locally advanced disease with abnormal lymphadenopathy in the right axilla      08/06/2013 Procedure    CT-guided biopsy of the lymphadenopathy was negative for malignancy      08/15/2013 Surgery    Patient has placement of port and feeding tube      08/19/2013 - 09/10/2013 Chemotherapy    Patient received chemotherapy with cisplatin. The patient only received 2 doses due to uncontrolled nausea and acute renal failure.      08/19/2013 - 10/15/2013 Radiation Therapy    Received Helical IMRT Tomotherapy:  Right Tonstil and bilateral neck / 70 Gy in 35 fractions to gross disease, 63 Gy in 35 fractions to high risk nodal echelons, and 56 Gy in 35 fractions to intermediate risk nodal echelons.      08/27/2013 - 08/30/2013 Hospital Admission    The patient was admitted to the hospital for uncontrolled nausea vomiting and dehydration.      02/14/2014 Imaging    PET/CT scan showed complete response to treatment  03/19/2014 Surgery    He had excisional lymph node biopsy from the right neck. Pathology was benign      05/13/2014 Surgery    He had removal of Port-A-Cath.      05/22/2014 Imaging    Repeat CT scan of the neck show no evidence of disease recurrence.      12/09/2014 Imaging    Ct neck without contrast showed persistent abnormalities on the right side of neck, indeterminate      12/25/2014 Imaging    PET CT scan showed disease recurrence.      02/10/2015 Procedure    He has  placement of port      02/13/2015 - 07/13/2015 Chemotherapy    He received chemotherapy with carbo/Taxol      04/21/2015 Imaging    PET CT scan showed improved disease control      08/04/2015 Imaging    PET scan showed persistent disease      08/17/2015 -  Chemotherapy    He was started with [facility]      10/19/2015 Imaging    Ct neck showed mass-like intermediate density soft tissue at the right lateral neck recurrence site stable.       02/26/2016 Imaging    CT neck showed unchanged appearance of right neck recurrence compared to 10/19/2015 CT. No noncontrast evidence of new metastatic disease in the neck. Chronic sinusitis, progressed.      02/26/2016 Imaging    Diffuse but patchy and asymmetric partial airspace filling process (ground-glass opacity) in the lungs. This could be due to respiratory bronchiolitis, hypersensitivity pneumonitis or possible drug reaction. Atypical/viral pneumonia is also possible. Pulmonary consultation may be a helpful. A three-month follow-up noncontrast chest CT is suggested. Slight interval enlargement of mediastinal lymph nodes and a small lymph node along the left major fissure. This is most likely due to the inflammatory process in the lungs. No findings for metastatic disease involving the chest. No findings for upper abdominal metastatic disease.      02/29/2016 Adverse Reaction    His treatment is placed on hold due to possible hypersensitivity pneumonitis/drug reaction      04/14/2016 Imaging    Ct chest showed no evidence for metastatic disease within the chest. Significant interval improvement and near complete resolution of previously described diffuse bilateral predominately ground-glass pulmonary opacities, most compatible with resolving infectious/inflammatory process.      09/05/2016 Imaging    CT scan of neck and chest  1. Unchanged appearance of right neck recurrence. 2. No evidence of new metastatic disease in the neck. 3.  Unremarkable and stable CT appearance of the chest. No findings suspicious for metastatic disease      07/24/2017 Imaging    1. No findings suspicious for metastatic disease in the chest. 2. No acute consolidative airspace disease to suggest a pneumonia. 3. No appreciable change in chronic mild patchy upper lung predominant centrilobular micronodularity. If the patient is a current smoker, these findings are most compatible with smoking related interstitial lung disease. If the patient is not a current smoker, these findings suggest subacute hypersensitivity pneumonitis  or postinflammatory change. 4. Stable mild biapical radiation fibrosis. 5. Mild to moderate centrilobular emphysema with diffuse bronchial wall thickening, suggesting COPD. 6. One vessel coronary atherosclerosis.  Aortic Atherosclerosis (ICD10-I70.0) and Emphysema (ICD10-J43.9).      09/15/2017 Imaging    1. No evidence of interstitial lung disease. 2. Emphysema (ICD10-J43.9).       REVIEW OF SYSTEMS:   Constitutional: Denies fevers,  chills or abnormal weight loss Eyes: Denies blurriness of vision Ears, nose, mouth, throat, and face: Denies mucositis or sore throat Cardiovascular: Denies palpitation, chest discomfort or lower extremity swelling Gastrointestinal:  Denies nausea, heartburn or change in bowel habits Skin: Denies abnormal skin rashes Lymphatics: Denies new lymphadenopathy or easy bruising Neurological:Denies numbness, tingling or new weaknesses Behavioral/Psych: Mood is stable, no new changes  All other systems were reviewed with the patient and are negative.  I have reviewed the past medical history, past surgical history, social history and family history with the patient and they are unchanged from previous note.  ALLERGIES:  is allergic to pollen extract.  MEDICATIONS:  Current Outpatient Medications  Medication Sig Dispense Refill  . chlorhexidine (PERIDEX) 0.12 % solution Rinse with 15 mls  twice daily for 30 seconds. Use after breakfast and at bedtime. Spit out excess. Do not swallow. 960 mL prn  . fluticasone (FLONASE) 50 MCG/ACT nasal spray Place 2 sprays into both nostrils daily. 16 g 2  . furosemide (LASIX) 40 MG tablet Take 1 tablet (40 mg total) by mouth daily. Take before 2 pm daily 14 tablet 0  . levothyroxine (SYNTHROID, LEVOTHROID) 25 MCG tablet TAKE 1 TABLET BY MOUTH DAILY BEFORE BREAKFAST. 90 tablet 1  . lidocaine-prilocaine (EMLA) cream Apply 1 application topically as needed. 30 g 6  . Multiple Vitamin (MULTIVITAMIN WITH MINERALS) TABS tablet Take 1 tablet by mouth daily.    Marland Kitchen omeprazole (PRILOSEC) 20 MG capsule Take 1 capsule (20 mg total) by mouth daily. 30 capsule 0  . oxyCODONE (ROXICODONE) 15 MG immediate release tablet Take 1 tablet (15 mg total) by mouth every 8 (eight) hours as needed. 90 tablet 0  . predniSONE (DELTASONE) 20 MG tablet Take 1 tablet (20 mg total) by mouth daily with breakfast. 30 tablet 0  . traZODone (DESYREL) 150 MG tablet Take 1 tablet (150 mg total) by mouth at bedtime. 30 tablet 11   No current facility-administered medications for this visit.    Facility-Administered Medications Ordered in Other Visits  Medication Dose Route Frequency Provider Last Rate Last Dose  . sodium chloride 0.9 % injection 10 mL  10 mL Intravenous PRN Alvy Bimler, Dexton Zwilling, MD   10 mL at 11/29/16 0818    PHYSICAL EXAMINATION: ECOG PERFORMANCE STATUS: 1 - Symptomatic but completely ambulatory  Vitals:   09/18/17 1121  BP: (!) 147/100  Pulse: 99  Resp: 18  Temp: 98.9 F (37.2 C)  SpO2: 100%   Filed Weights   09/18/17 1121  Weight: 277 lb 4.8 oz (125.8 kg)    GENERAL:alert, no distress and comfortable SKIN: skin color, texture, turgor are normal, no rashes or significant lesions EYES: normal, Conjunctiva are pink and non-injected, sclera clear OROPHARYNX:no exudate, no erythema and lips, buccal mucosa, and tongue normal  NECK: Significant scarring on the  right neck, stable LYMPH:  no palpable lymphadenopathy in the cervical, axillary or inguinal LUNGS: clear to auscultation and percussion with normal breathing effort HEART: regular rate & rhythm and no murmurs and no lower extremity edema ABDOMEN:abdomen soft, non-tender and normal bowel sounds Musculoskeletal:no cyanosis of digits and no clubbing  NEURO: alert & oriented x 3 with fluent speech, no focal motor/sensory deficits  LABORATORY DATA:  I have reviewed the data as listed    Component Value Date/Time   NA 141 07/13/2017 0819   NA 141 04/20/2017 1028   K 4.2 07/13/2017 0819   K 4.3 04/20/2017 1028   CL 107 07/13/2017 0819  CO2 27 07/13/2017 0819   CO2 26 04/20/2017 1028   GLUCOSE 77 07/13/2017 0819   GLUCOSE 96 04/20/2017 1028   BUN 14 07/13/2017 0819   BUN 16.7 04/20/2017 1028   CREATININE 1.69 (H) 07/13/2017 0819   CREATININE 2.0 (H) 04/20/2017 1028   CALCIUM 9.1 07/13/2017 0819   CALCIUM 9.0 04/20/2017 1028   PROT 7.0 07/13/2017 0819   PROT 7.0 04/20/2017 1028   ALBUMIN 3.9 07/13/2017 0819   ALBUMIN 4.0 04/20/2017 1028   AST 20 07/13/2017 0819   AST 20 04/20/2017 1028   ALT 20 07/13/2017 0819   ALT 17 04/20/2017 1028   ALKPHOS 62 07/13/2017 0819   ALKPHOS 61 04/20/2017 1028   BILITOT 0.8 07/13/2017 0819   BILITOT 0.49 04/20/2017 1028   GFRNONAA 46 (L) 07/13/2017 0819   GFRAA 53 (L) 07/13/2017 0819    No results found for: SPEP, UPEP  Lab Results  Component Value Date   WBC 11.7 (H) 09/18/2017   NEUTROABS 10.9 (H) 09/18/2017   HGB 12.8 (L) 09/18/2017   HCT 40.0 09/18/2017   MCV 90.5 09/18/2017   PLT 208 09/18/2017      Chemistry      Component Value Date/Time   NA 141 07/13/2017 0819   NA 141 04/20/2017 1028   K 4.2 07/13/2017 0819   K 4.3 04/20/2017 1028   CL 107 07/13/2017 0819   CO2 27 07/13/2017 0819   CO2 26 04/20/2017 1028   BUN 14 07/13/2017 0819   BUN 16.7 04/20/2017 1028   CREATININE 1.69 (H) 07/13/2017 0819   CREATININE 2.0 (H)  04/20/2017 1028      Component Value Date/Time   CALCIUM 9.1 07/13/2017 0819   CALCIUM 9.0 04/20/2017 1028   ALKPHOS 62 07/13/2017 0819   ALKPHOS 61 04/20/2017 1028   AST 20 07/13/2017 0819   AST 20 04/20/2017 1028   ALT 20 07/13/2017 0819   ALT 17 04/20/2017 1028   BILITOT 0.8 07/13/2017 0819   BILITOT 0.49 04/20/2017 1028       RADIOGRAPHIC STUDIES: I have personally reviewed the radiological images as listed and agreed with the findings in the report. Ct Chest Wo Contrast  Result Date: 09/18/2017 CLINICAL DATA:  Shortness of breath.  Tonsillar cancer. EXAM: CT CHEST WITHOUT CONTRAST TECHNIQUE: Multidetector CT imaging of the chest was performed following the standard protocol without IV contrast. COMPARISON:  Prior chest CT exams dating back to 10/19/2015 and PET 08/04/2015. FINDINGS: Cardiovascular: Right IJ Port-A-Cath terminates at the SVC RA junction. Coronary artery calcification. Heart is at the upper limits of normal in size to mildly enlarged. No pericardial effusion. Mediastinum/Nodes: Mediastinal lymph nodes are not enlarged by CT size criteria. Hilar regions are difficult to evaluate without IV contrast. No axillary adenopathy. Esophagus is grossly unremarkable. Lungs/Pleura: Centrilobular emphysema. Negative for subpleural reticulation, traction bronchiectasis/bronchiolectasis, architectural distortion or honeycombing. Mild biapical scarring, likely radiation related. No pleural fluid. Airway is unremarkable. No air trapping. Upper Abdomen: Visualized portions of the liver, gallbladder, adrenal glands, kidneys, spleen, pancreas, stomach and bowel are grossly unremarkable. Musculoskeletal: Degenerative changes in the spine. No worrisome lytic or sclerotic lesions. IMPRESSION: 1. No evidence of interstitial lung disease. 2.  Emphysema (ICD10-J43.9). Electronically Signed   By: Lorin Picket M.D.   On: 09/18/2017 09:25    All questions were answered. The patient knows to call the  clinic with any problems, questions or concerns. No barriers to learning was detected.  I spent 15 minutes counseling the patient face  to face. The total time spent in the appointment was 20 minutes and more than 50% was on counseling and review of test results  [name], MD 09/18/2017 11:34 AM

## 2017-09-18 NOTE — Assessment & Plan Note (Signed)
This is likely related to fluid retention from prednisone therapy If his blood pressure remained elevated next month, I will start him on blood pressure medication

## 2017-09-19 ENCOUNTER — Telehealth: Payer: Self-pay | Admitting: Hematology and Oncology

## 2017-09-19 NOTE — Telephone Encounter (Signed)
Mailed patient calendar of upcoming July appointments.

## 2017-10-03 ENCOUNTER — Other Ambulatory Visit: Payer: Self-pay | Admitting: *Deleted

## 2017-10-03 DIAGNOSIS — C099 Malignant neoplasm of tonsil, unspecified: Secondary | ICD-10-CM

## 2017-10-03 MED ORDER — OXYCODONE HCL 15 MG PO TABS: 15.0000 mg | ORAL_TABLET | Freq: Three times a day (TID) | ORAL | 0 refills | Status: DC | PRN

## 2017-10-03 MED FILL — oxyCODONE HCL 15 MG TABS: 15 | 30 days supply | Qty: 90 | Fill #0

## 2017-10-03 NOTE — Telephone Encounter (Signed)
Pt states he needs a refill of oxycodone 15mg . Has not heard from [facility] pulmonary referral. RN will call [facility] to follow up.

## 2017-10-09 ENCOUNTER — Ambulatory Visit: Payer: Medicaid Other | Admitting: Pulmonary Disease

## 2017-10-09 ENCOUNTER — Encounter: Payer: Self-pay | Admitting: Pulmonary Disease

## 2017-10-09 VITALS — BP 120/84 | HR 87 | Ht 74.0 in | Wt 278.8 lb

## 2017-10-09 DIAGNOSIS — J984 Other disorders of lung: Secondary | ICD-10-CM

## 2017-10-09 DIAGNOSIS — J432 Centrilobular emphysema: Secondary | ICD-10-CM | POA: Diagnosis not present

## 2017-10-09 MED ORDER — ALBUTEROL SULFATE HFA 108 (90 BASE) MCG/ACT IN AERS: 2.0000 | INHALATION_SPRAY | Freq: Four times a day (QID) | RESPIRATORY_TRACT | 6 refills | Status: DC | PRN

## 2017-10-09 MED FILL — PROAIR HFA 90 MCG INHALER: 108 (90 BAS | 25 days supply | Qty: 9 | Fill #0

## 2017-10-09 NOTE — Assessment & Plan Note (Signed)
He does have mild biapical changes of radiation pneumonitis-I do not feel that this accounts for the degree of restriction.  He does not seem to have any evidence of interstitial lung disease. No evidence of neuromuscular disease We will observe him for the next 3 to 4 months, will investigate further only if worsening

## 2017-10-09 NOTE — Addendum Note (Signed)
Addended by: Valerie Salts on: 10/09/2017 04:10 PM   Modules accepted: Orders

## 2017-10-09 NOTE — Assessment & Plan Note (Signed)
Appears to be mild, no evidence of airway obstruction on spirometry. We will give him a prescription for albuterol only due to his symptoms and see if this helps him symptomatically He has quit smoking already

## 2017-10-09 NOTE — Progress Notes (Signed)
Subjective:    Patient ID: Mason [name], male    DOB: 12-24-1965, 52 y.o.   MRN: [id number]  HPI  Chief Complaint  Patient presents with  . Pulm Consult    Referred by Dr. Alvy Bimler for possible radiation/chemo damage to his lungs. States SOB and wheezing has increased.     52 year old ex-smoker presents for evaluation of dyspnea, referred by oncology  He has a history of right tonsillar cancer diagnosed in 2015, initially underwent chemotherapy and radiation but had a recurrence and required 16 cycles of Keytruda. He is now in remission.  He had an intercurrent critical illness due to aspiration and pneumonia where he dropped his weight down to about 150 pounds but now has slowly recovered back to 278 pounds, his PEG has been discontinued.  He is edentulous and is awaiting dentures  He reports dyspnea on exertion and overall decreased energy Surveillance CT chest 09/18/2017 personally reviewed, showed mild centrilobular emphysema and biapical scarring likely related to radiation.  I have also reviewed his prior CT scans   He reports cough productive of clear sputum but denies wheezing he has not tried any bronchodilators.  He denies pedal edema, orthopnea paroxysmal nocturnal dyspnea. He is married and is accompanied by his wife Vaughan Basta today.  He smoked more than 30 pack years until he quit in 2008.  He was a Psychologist, educational in college unfortunately developed substance abuse and was incarcerated for 22 years.  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 Abnormal liver function test   . Acute sinusitis, unspecified 05/18/2015  . Anemia   . Anxiety    mild new dx  . Arthritis    knees,hips  . Bilateral edema of lower extremity   . Complication of  anesthesia    Pt stated " my oxygen level was slow in rising."  . Concussion    Hx: in high school x 2  . Constipation   . Dysuria 09/04/2015  . Fever   . Hyperactive gag reflex   . Hypertension   . Hypoglycemia   . Hypokalemia   . Insomnia 06/15/2015  . Knee pain, chronic   . Malnutrition ([place])   . Non-healing surgical wound 05/23/2014  . PEG (percutaneous endoscopic gastrostomy) status ([place])   . Renal failure, acute ([place])   . S/P radiation therapy 08/19/2013-10/15/2013   Right Tonstil and bilateral neck / 70 Gy in 35 fractions to gross disease, 63 Gy in 35 fractions to high risk nodal echelons, and 56 Gy in 35 fractions to intermediate risk nodal echelons  . Severe nausea and vomiting   . Status post chemotherapy    Only received 2 doses due to uncontrolled nausea and acute renal failure  . Tonsillar cancer ([place]) 07/09/13   SCCA of Right Tonsil, recurrent 2016   Past Surgical History:  Procedure Laterality Date  . LAPAROSCOPIC GASTROSTOMY N/A 08/15/2013   Procedure: LAPAROSCOPIC GASTROSTOMY TUBE PLACEMENT;  Surgeon: [name], MD;  Location: [facility];  Service: General;  Laterality: N/A;  . LYMPH NODE BIOPSY  03/20/14   right neck  . MULTIPLE EXTRACTIONS WITH ALVEOLOPLASTY N/A 08/01/2013   Procedure: Extraction of tooth #'s 1,15,17,31, 32 with alveoloplasty, mandibular left torus reduction, and gross debridement of remaining teeth.;  Surgeon: [name], DDS;  Location: [facility];  Service: Oral Surgery;  Laterality: N/A;  . PORT-A-CATH REMOVAL N/A 05/13/2014   Procedure: REMOVAL of PORT-A-CATH;  Surgeon: [name], MD;  Location: [facility];  Service: General;  Laterality: N/A;  . PORTACATH PLACEMENT Left 08/15/2013   Procedure: INSERTION PORT-A-CATH;  Surgeon: [name], MD;  Location: [facility];  Service: General;  Laterality: Left;    Allergies  Allergen Reactions  . Pollen Extract Other (See Comments)    Watery eyes, runny nose, sneezing    Social History    Socioeconomic History  . Marital status: Single    Spouse name: Not on file  . Number of children: 1  . Years of education: Not on file  . Highest education level: Not on file  Occupational History  . Not on file  Social Needs  . Financial resource strain: Not on file  . Food insecurity:    Worry: Not on file    Inability: Not on file  . Transportation needs:    Medical: Not on file    Non-medical: Not on file  Tobacco Use  . Smoking status: Former Smoker    Packs/day: 1.00    Years: 20.00    Pack years: 20.00    Types: Cigarettes    Last attempt to quit: 07/16/2003    Years since quitting: 14.2  . Smokeless tobacco: Never Used  Substance and Sexual Activity  . Alcohol use: No    Comment: none years ago  . Drug use: Yes    Types: Marijuana, Benzodiazepines    Comment: years ago  . Sexual activity: Never  Lifestyle  . Physical activity:    Days per week: Not on file    Minutes per session: Not on file  . Stress: Not on file  Relationships  . Social connections:    Talks on phone: Not on file    Gets together: Not on file    Attends religious service: Not on file    Active member of club or organization: Not on file    Attends meetings of clubs or organizations: Not on file    Relationship status: Not on file  . Intimate partner violence:    Fear of current or ex partner: Not on file    Emotionally abused: Not on file    Physically abused: Not on file    Forced sexual activity: Not on file  Other Topics Concern  . Not on file  Social History Narrative  . Not on file      Family History  Problem Relation Age of Onset  . Arthritis Mother   . Arthritis Father   . CVA Unknown   . Diabetes Unknown   . Heart attack Unknown      Review of Systems Positive for shortness of breath with activity and at rest, nonproductive cough  Constitutional: negative for anorexia, fevers and sweats  Eyes: negative for irritation, redness and visual disturbance  Ears,  nose, mouth, throat, and face: negative for earaches, epistaxis, nasal congestion and sore throat  Respiratory: negative for  sputum and wheezing  Cardiovascular: negative for chest pain,  lower extremity edema, orthopnea, palpitations and syncope  Gastrointestinal: negative for abdominal pain, constipation, diarrhea, melena, nausea and vomiting  Genitourinary:negative for dysuria, frequency and hematuria  Hematologic/lymphatic: negative for bleeding, easy bruising and lymphadenopathy  Musculoskeletal:negative for arthralgias, muscle weakness and stiff joints  Neurological: negative for coordination problems, gait problems, headaches and weakness  Endocrine: negative  for diabetic symptoms including polydipsia, polyuria and weight loss     Objective:   Physical Exam  Gen. Pleasant, tall,obese, in no distress, normal affect ENT - no lesions, no post nasal drip, class 2-3 airway Neck: No JVD, no thyromegaly, no carotid bruits Lungs: no use of accessory muscles, no dullness to percussion, rt fine basal rales no rhonchi  Cardiovascular: Rhythm regular, heart sounds  normal, no murmurs or gallops, no peripheral edema Abdomen: soft and non-tender, no hepatosplenomegaly, BS normal. Musculoskeletal: No deformities, no cyanosis or clubbing Neuro:  alert, non focal, no tremors        Assessment & Plan:

## 2017-10-09 NOTE — Patient Instructions (Signed)
You have moderate restriction in your lungs.  Prescription for albuterol 2 puffs every 6 hours as needed

## 2017-10-10 ENCOUNTER — Other Ambulatory Visit: Payer: Self-pay | Admitting: Hematology and Oncology

## 2017-10-10 ENCOUNTER — Telehealth: Payer: Self-pay

## 2017-10-10 NOTE — Telephone Encounter (Signed)
Called and given below message. He verbalized understanding. Scheduling message sent, her is available 7/15.

## 2017-10-10 NOTE — Telephone Encounter (Signed)
-----   Message from [name], MD sent at 10/10/2017  8:02 AM EDT ----- Regarding: resume chemo I got notification from pulmonologist who diagnosed him with COPD. Ok to resume chemo. Is he available on 7/15 (scheduled to see me that day)? If so, add labs and chemo same day (2 hours chemo)  Thanks

## 2017-10-11 ENCOUNTER — Telehealth: Payer: Self-pay | Admitting: Hematology and Oncology

## 2017-10-11 NOTE — Telephone Encounter (Signed)
Mailed patient calendar of upcoming July appointment updates per 6/25 sch message

## 2017-10-29 ENCOUNTER — Encounter (HOSPITAL_COMMUNITY): Payer: Self-pay | Admitting: Emergency Medicine

## 2017-10-29 ENCOUNTER — Inpatient Hospital Stay (HOSPITAL_COMMUNITY)
Admission: EM | Admit: 2017-10-29 | Discharge: 2017-11-02 | DRG: 291 | Disposition: A | Discharge status: Home / Self Care | Payer: Medicaid Other | Attending: Internal Medicine | Admitting: Internal Medicine

## 2017-10-29 ENCOUNTER — Other Ambulatory Visit: Payer: Self-pay

## 2017-10-29 ENCOUNTER — Emergency Department (HOSPITAL_COMMUNITY): Payer: Medicaid Other

## 2017-10-29 DIAGNOSIS — I130 Hypertensive heart and chronic kidney disease with heart failure and stage 1 through stage 4 chronic kidney disease, or unspecified chronic kidney disease: Principal | ICD-10-CM | POA: Diagnosis present

## 2017-10-29 DIAGNOSIS — F1410 Cocaine abuse, uncomplicated: Secondary | ICD-10-CM | POA: Diagnosis present

## 2017-10-29 DIAGNOSIS — I509 Heart failure, unspecified: Secondary | ICD-10-CM

## 2017-10-29 DIAGNOSIS — I272 Pulmonary hypertension, unspecified: Secondary | ICD-10-CM | POA: Diagnosis present

## 2017-10-29 DIAGNOSIS — Z7951 Long term (current) use of inhaled steroids: Secondary | ICD-10-CM

## 2017-10-29 DIAGNOSIS — N183 Chronic kidney disease, stage 3 (moderate): Secondary | ICD-10-CM | POA: Diagnosis present

## 2017-10-29 DIAGNOSIS — Z87891 Personal history of nicotine dependence: Secondary | ICD-10-CM

## 2017-10-29 DIAGNOSIS — Z931 Gastrostomy status: Secondary | ICD-10-CM

## 2017-10-29 DIAGNOSIS — R599 Enlarged lymph nodes, unspecified: Secondary | ICD-10-CM | POA: Diagnosis present

## 2017-10-29 DIAGNOSIS — Z9221 Personal history of antineoplastic chemotherapy: Secondary | ICD-10-CM

## 2017-10-29 DIAGNOSIS — Z79899 Other long term (current) drug therapy: Secondary | ICD-10-CM

## 2017-10-29 DIAGNOSIS — R7989 Other specified abnormal findings of blood chemistry: Secondary | ICD-10-CM

## 2017-10-29 DIAGNOSIS — R000 Tachycardia, unspecified: Secondary | ICD-10-CM | POA: Diagnosis present

## 2017-10-29 DIAGNOSIS — R945 Abnormal results of liver function studies: Secondary | ICD-10-CM

## 2017-10-29 DIAGNOSIS — N289 Disorder of kidney and ureter, unspecified: Secondary | ICD-10-CM

## 2017-10-29 DIAGNOSIS — I429 Cardiomyopathy, unspecified: Secondary | ICD-10-CM | POA: Diagnosis present

## 2017-10-29 DIAGNOSIS — Z85818 Personal history of malignant neoplasm of other sites of lip, oral cavity, and pharynx: Secondary | ICD-10-CM

## 2017-10-29 DIAGNOSIS — Z923 Personal history of irradiation: Secondary | ICD-10-CM

## 2017-10-29 DIAGNOSIS — Z79890 Hormone replacement therapy (postmenopausal): Secondary | ICD-10-CM

## 2017-10-29 DIAGNOSIS — I5041 Acute combined systolic (congestive) and diastolic (congestive) heart failure: Secondary | ICD-10-CM | POA: Diagnosis present

## 2017-10-29 DIAGNOSIS — E039 Hypothyroidism, unspecified: Secondary | ICD-10-CM | POA: Diagnosis present

## 2017-10-29 DIAGNOSIS — R911 Solitary pulmonary nodule: Secondary | ICD-10-CM | POA: Diagnosis present

## 2017-10-29 DIAGNOSIS — Z79891 Long term (current) use of opiate analgesic: Secondary | ICD-10-CM

## 2017-10-29 LAB — CBC WITH DIFFERENTIAL/PLATELET
BASOS ABS: 0 10*3/uL (ref 0.0–0.1)
Basophils Relative: 0 %
EOS ABS: 0.1 10*3/uL (ref 0.0–0.7)
EOS PCT: 1 %
HCT: 38.4 % — ABNORMAL LOW (ref 39.0–52.0)
Hemoglobin: 12.2 g/dL — ABNORMAL LOW (ref 13.0–17.0)
Lymphocytes Relative: 7 %
Lymphs Abs: 0.6 10*3/uL — ABNORMAL LOW (ref 0.7–4.0)
MCH: 29.5 pg (ref 26.0–34.0)
MCHC: 31.8 g/dL (ref 30.0–36.0)
MCV: 92.8 fL (ref 78.0–100.0)
Monocytes Absolute: 0.4 10*3/uL (ref 0.1–1.0)
Monocytes Relative: 5 %
Neutro Abs: 7.1 10*3/uL (ref 1.7–7.7)
Neutrophils Relative %: 87 %
PLATELETS: 222 10*3/uL (ref 150–400)
RBC: 4.14 MIL/uL — ABNORMAL LOW (ref 4.22–5.81)
RDW: 15.9 % — ABNORMAL HIGH (ref 11.5–15.5)
WBC: 8.2 10*3/uL (ref 4.0–10.5)

## 2017-10-29 LAB — URINALYSIS, ROUTINE W REFLEX MICROSCOPIC
BACTERIA UA: NONE SEEN
BILIRUBIN URINE: NEGATIVE
Glucose, UA: NEGATIVE mg/dL
Hgb urine dipstick: NEGATIVE
Ketones, ur: NEGATIVE mg/dL
NITRITE: NEGATIVE
PH: 5 (ref 5.0–8.0)
Protein, ur: 30 mg/dL — AB
SPECIFIC GRAVITY, URINE: 1.039 — AB (ref 1.005–1.030)

## 2017-10-29 LAB — COMPREHENSIVE METABOLIC PANEL
ALT: 70 U/L — ABNORMAL HIGH (ref 0–44)
AST: 46 U/L — ABNORMAL HIGH (ref 15–41)
Albumin: 3.3 g/dL — ABNORMAL LOW (ref 3.5–5.0)
Alkaline Phosphatase: 47 U/L (ref 38–126)
Anion gap: 7 (ref 5–15)
BILIRUBIN TOTAL: 1 mg/dL (ref 0.3–1.2)
BUN: 20 mg/dL (ref 6–20)
CO2: 28 mmol/L (ref 22–32)
Calcium: 8.5 mg/dL — ABNORMAL LOW (ref 8.9–10.3)
Chloride: 108 mmol/L (ref 98–111)
Creatinine, Ser: 1.59 mg/dL — ABNORMAL HIGH (ref 0.61–1.24)
GFR calc Af Amer: 57 mL/min — ABNORMAL LOW (ref >60)
GFR, EST NON AFRICAN AMERICAN: 49 mL/min — AB (ref >60)
Glucose, Bld: 100 mg/dL — ABNORMAL HIGH (ref 70–99)
POTASSIUM: 4.1 mmol/L (ref 3.5–5.1)
Sodium: 143 mmol/L (ref 135–145)
TOTAL PROTEIN: 5.6 g/dL — AB (ref 6.5–8.1)

## 2017-10-29 LAB — BRAIN NATRIURETIC PEPTIDE: B Natriuretic Peptide: 1406.6 pg/mL — ABNORMAL HIGH (ref 0.0–100.0)

## 2017-10-29 LAB — I-STAT CG4 LACTIC ACID, ED
Lactic Acid, Venous: 0.97 mmol/L (ref 0.5–1.9)
Lactic Acid, Venous: 0.85 mmol/L (ref 0.5–1.9)

## 2017-10-29 MED ORDER — SODIUM CHLORIDE 0.9 % IV BOLUS
1000.0000 mL | Freq: Once | INTRAVENOUS | Status: AC
  Administered 2017-10-29: 1000 mL via INTRAVENOUS

## 2017-10-29 MED ORDER — FUROSEMIDE 10 MG/ML IJ SOLN
40.0000 mg | Freq: Once | INTRAMUSCULAR | Status: AC
  Administered 2017-10-30: 40 mg via INTRAVENOUS
  Filled 2017-10-29: qty 4

## 2017-10-29 MED ORDER — IOPAMIDOL (ISOVUE-370) INJECTION 76%
100.0000 mL | Freq: Once | INTRAVENOUS | Status: AC | PRN
  Administered 2017-10-29: 75 mL via INTRAVENOUS

## 2017-10-29 MED ORDER — IOPAMIDOL (ISOVUE-370) INJECTION 76%
INTRAVENOUS | Status: AC
  Filled 2017-10-29: qty 100

## 2017-10-29 NOTE — ED Provider Notes (Signed)
[facility] DEPT Provider Note   CSN: [id number] Arrival date & time: 10/29/17  1909     History   Chief Complaint Chief Complaint  Patient presents with  . Shortness of Breath  . Dizziness    HPI Mason [name] is a 52 y.o. male.  HPI Patient presents with concern of dyspnea, fatigue, cough. Symptoms have been present for about 2 weeks, worse over the past 3 days. Patient has multiple medical issues including tonsil malignancy, for which she is scheduled to begin chemotherapy tomorrow. This is a recurrence, he notes that he had been clear of cancer for possibly 1 year. Now, with his ongoing illness, has had no relief in spite of using multiple medications, seeing physician, urgent care.  Past Medical History:  Diagnosis Date  . Abnormal liver function test   . Acute sinusitis, unspecified 05/18/2015  . Anemia   . Anxiety    mild new dx  . Arthritis    knees,hips  . Bilateral edema of lower extremity   . Complication of anesthesia    Pt stated " my oxygen level was slow in rising."  . Concussion    Hx: in high school x 2  . Constipation   . Dysuria 09/04/2015  . Fever   . Hyperactive gag reflex   . Hypertension   . Hypoglycemia   . Hypokalemia   . Insomnia 06/15/2015  . Knee pain, chronic   . Malnutrition ([place])   . Non-healing surgical wound 05/23/2014  . PEG (percutaneous endoscopic gastrostomy) status ([place])   . Renal failure, acute ([place])   . S/P radiation therapy 08/19/2013-10/15/2013   Right Tonstil and bilateral neck / 70 Gy in 35 fractions to gross disease, 63 Gy in 35 fractions to high risk nodal echelons, and 56 Gy in 35 fractions to intermediate risk nodal echelons  . Severe nausea and vomiting   . Status post chemotherapy    Only received 2 doses due to uncontrolled nausea and acute renal failure  . 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 Restrictive lung disease 10/09/2017  . Emphysema of lung ([place]) 09/18/2017  . Dyspnea and respiratory abnormalities 07/13/2017  . 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 Acquired hypothyroidism 09/06/2016  . Encounter for antineoplastic chemotherapy 08/09/2016  . Debility 07/15/2016  . Cough 06/17/2016  . Nasal congestion 06/17/2016  . Elevated BP without diagnosis of hypertension 06/17/2016  . Poor dentition 05/20/2016  . Drug-induced pneumonitis 02/29/2016  . Sinus congestion 02/08/2016  . Weight loss 01/18/2016  . 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 Dysuria 09/04/2015  . Palliative care by specialist 08/05/2015  . Insomnia 06/15/2015  . Acute gout involving toe of right foot 05/25/2015  . Acute sinusitis, unspecified 05/18/2015  . Pancytopenia due to antineoplastic chemotherapy ([place]) 04/27/2015  . Primary cancer of tonsillar fossa ([place]) 03/20/2015  . Cancer associated pain 02/12/2015  . Lung nodule, multiple 12/30/2014  . 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  . Chronic kidney disease (CKD), stage II (mild) 12/02/2014  . Abdominal wall pain in left upper quadrant 09/30/2014  . Acquired dysphasia 07/30/2014  . 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 Lymphedema of face 06/27/2014  . Dysphagia, oropharyngeal 06/27/2014  . Neck stiffness 06/27/2014  . Throat pain in adult 02/19/2014  . Anemia in neoplastic disease 10/14/2013  . Leukopenia  due to antineoplastic chemotherapy ([place]) 10/14/2013  . Prerenal renal failure 10/14/2013  . Nausea & vomiting 09/12/2013  . Chronic periodontitis 08/01/2013  . Tonsillar cancer ([place]) 07/16/2013    Past Surgical History:  Procedure Laterality Date  . LAPAROSCOPIC GASTROSTOMY N/A 08/15/2013   Procedure: LAPAROSCOPIC GASTROSTOMY TUBE PLACEMENT;  Surgeon: [name], MD;  Location: [facility];  Service: General;  Laterality: N/A;  . LYMPH NODE BIOPSY  03/20/14   right neck  .  MULTIPLE EXTRACTIONS WITH ALVEOLOPLASTY N/A 08/01/2013   Procedure: Extraction of tooth #'s 1,15,17,31, 32 with alveoloplasty, mandibular left torus reduction, and gross debridement of remaining teeth.;  Surgeon: [name], DDS;  Location: [facility];  Service: Oral Surgery;  Laterality: N/A;  . PORT-A-CATH REMOVAL N/A 05/13/2014   Procedure: REMOVAL of PORT-A-CATH;  Surgeon: [name], MD;  Location: [facility];  Service: General;  Laterality: N/A;  . PORTACATH PLACEMENT Left 08/15/2013   Procedure: INSERTION PORT-A-CATH;  Surgeon: [name], MD;  Location: [facility];  Service: General;  Laterality: Left;        Home Medications    Prior to Admission medications   Medication Sig Start Date End Date Taking? Authorizing Provider  albuterol (PROVENTIL HFA;VENTOLIN HFA) 108 (90 Base) MCG/ACT inhaler Inhale 2 puffs into the lungs every 6 (six) hours as needed for wheezing or shortness of breath. 10/09/17  Yes Rigoberto Noel, MD  chlorhexidine (PERIDEX) 0.12 % solution Rinse with 15 mls twice daily for 30 seconds. Use after breakfast and at bedtime. Spit out excess. Do not swallow. 02/09/17  Yes [name], DDS  fluticasone (FLONASE) 50 MCG/ACT nasal spray Place 2 sprays into both nostrils daily. 09/06/16  Yes Gorsuch, Ni, MD  levothyroxine (SYNTHROID, LEVOTHROID) 25 MCG tablet TAKE 1 TABLET BY MOUTH DAILY BEFORE BREAKFAST. 05/08/17  Yes Gorsuch, Ni, MD  lidocaine-prilocaine (EMLA) cream Apply 1 application topically as needed. 06/15/17  Yes Gorsuch, Ni, MD  oxyCODONE (ROXICODONE) 15 MG immediate release tablet Take 1 tablet (15 mg total) by mouth every 8 (eight) hours as needed. 10/03/17  Yes [name], MD    Family History Family History  Problem Relation Age of Onset  . Arthritis Mother   . Arthritis Father   . CVA Unknown   . Diabetes Unknown   . Heart attack Unknown     Social History Social History   Tobacco Use  . Smoking status: Former Smoker    Packs/day: 1.00     Years: 20.00    Pack years: 20.00    Types: Cigarettes    Last attempt to quit: 07/16/2003    Years since quitting: 14.2  . Smokeless tobacco: Never Used  Substance Use Topics  . Alcohol use: No    Comment: none years ago  . Drug use: Yes    Types: Marijuana, Benzodiazepines    Comment: years ago     Allergies   Pollen extract   Review of Systems Review of Systems  Constitutional:       Per HPI, otherwise negative  HENT:       Per HPI, otherwise negative  Respiratory:       Per HPI, otherwise negative  Cardiovascular:       Per HPI, otherwise negative  Gastrointestinal: Positive for nausea. Negative for vomiting.  Endocrine:       Negative aside from HPI  Genitourinary:       Neg aside from HPI   Musculoskeletal:       Per HPI, otherwise  negative  Skin: Negative.   Allergic/Immunologic: Positive for immunocompromised state.  Neurological: Positive for weakness. Negative for syncope.     Physical Exam Updated Vital Signs BP (!) 136/118   Pulse (!) 102   Temp 98.7 F (37.1 C) (Oral)   Resp 20   Ht 6\' 6"  (1.981 m)   Wt 126.1 kg (278 lb)   SpO2 98%   BMI 32.13 kg/m   Physical Exam  Constitutional: He is oriented to person, place, and time. He appears well-developed. No distress.  HENT:  Head: Normocephalic and atraumatic.  Eyes: Conjunctivae and EOM are normal.  Cardiovascular: Regular rhythm. Tachycardia present.  Pulmonary/Chest: Tachypnea noted. He has decreased breath sounds.  Abdominal: He exhibits no distension.  Musculoskeletal: He exhibits no edema.  Neurological: He is alert and oriented to person, place, and time.  Skin: Skin is warm and dry.  Psychiatric: He has a normal mood and affect.  Nursing note and vitals reviewed.    ED Treatments / Results  Labs (all labs ordered are listed, but only abnormal results are displayed) Labs Reviewed  COMPREHENSIVE METABOLIC PANEL - Abnormal; Notable for the following components:      Result Value     Glucose, Bld 100 (*)    Creatinine, Ser 1.59 (*)    Calcium 8.5 (*)    Total Protein 5.6 (*)    Albumin 3.3 (*)    AST 46 (*)    ALT 70 (*)    GFR calc non Af Amer 49 (*)    GFR calc Af Amer 57 (*)    All other components within normal limits  CBC WITH DIFFERENTIAL/PLATELET - Abnormal; Notable for the following components:   RBC 4.14 (*)    Hemoglobin 12.2 (*)    HCT 38.4 (*)    RDW 15.9 (*)    Lymphs Abs 0.6 (*)    All other components within normal limits  URINALYSIS, ROUTINE W REFLEX MICROSCOPIC - Abnormal; Notable for the following components:   Specific Gravity, Urine 1.039 (*)    Protein, ur 30 (*)    Leukocytes, UA TRACE (*)    All other components within normal limits  BRAIN NATRIURETIC PEPTIDE - Abnormal; Notable for the following components:   B Natriuretic Peptide 1,406.6 (*)    All other components within normal limits  I-STAT CG4 LACTIC ACID, ED  I-STAT CG4 LACTIC ACID, ED    EKG EKG Interpretation  Date/Time:  Sunday October 29 2017 19:28:54 EDT Ventricular Rate:  97 PR Interval:    QRS Duration: 102 QT Interval:  386 QTC Calculation: 491 R Axis:   31 Text Interpretation:  Sinus rhythm Probable left atrial enlargement Nonspecific T abnormalities, lateral leads Borderline prolonged QT interval Abnormal ekg Confirmed by Carmin Muskrat [phone]) on 10/29/2017 8:19:17 PM   Radiology Dg Chest 2 View  Result Date: 10/29/2017 CLINICAL DATA:  Chest pain and shortness of breath with head neck cancer. EXAM: CHEST - 2 VIEW COMPARISON:  09/15/2017 CT.  07/13/2017 plain film. FINDINGS: Moderate lower thoracic spondylosis. Right Port-A-Cath which terminates at the low SVC. Midline trachea. Borderline cardiomegaly. Mediastinal contours otherwise within normal limits. No pleural effusion or pneumothorax. Diffuse peribronchial thickening. No lobar consolidation. IMPRESSION: No acute cardiopulmonary disease. Peribronchial thickening which may relate to chronic bronchitis or  smoking. Electronically Signed   By: Abigail Miyamoto M.D.   On: 10/29/2017 20:23   Ct Angio Chest Pe W/cm &/or Wo Cm  Result Date: 10/29/2017 CLINICAL DATA:  Shortness of breath  and leg swelling for several days. Dizziness. EXAM: CT ANGIOGRAPHY CHEST WITH CONTRAST TECHNIQUE: Multidetector CT imaging of the chest was performed using the standard protocol during bolus administration of intravenous contrast. Multiplanar CT image reconstructions and MIPs were obtained to evaluate the vascular anatomy. CONTRAST:  62mL ISOVUE-370 IOPAMIDOL (ISOVUE-370) INJECTION 76% COMPARISON:  Plain films of earlier today.  CT 09/15/2017. FINDINGS: Cardiovascular: The quality of this exam for evaluation of pulmonary embolism is moderate. The bolus is well timed. The primary limitation is motion. No evidence of pulmonary embolism. A right-sided Port-A-Cath terminates at the superior caval/atrial junction. Mild cardiomegaly, without pericardial effusion. The aorta is normal in caliber but not well opacified secondary to bolus timing. Mediastinum/Nodes: Precarinal node measures 1.3 cm and is newly enlarged. A node within the azygoesophageal recess is newly enlarged at 1.9 cm on 51/5. Right hilar adenopathy at 1.9 cm. Prevascular node of 10 mm is enlarged from 7 mm on the prior. Lungs/Pleura: Small pleural effusions, larger on the right. Mild to moderate motion degradation, most significant inferiorly. Mild centrilobular emphysema with superimposed mild interstitial edema, as evidenced by areas of heterogeneous ground-glass opacity and smooth septal thickening. A vague left apical 4 mm nodule on 45/11 is new since the prior, favored to represent a subpleural lymph node. Upper Abdomen: Contrast reflux into the hepatic veins and IVC, suggesting elevated right heart pressures. Motion degradation continuing into the upper abdomen. Normal imaged portions of the liver, spleen, stomach, pancreas, gallbladder, biliary tract, adrenal glands,  kidneys. Musculoskeletal: Moderate thoracic spondylosis. Review of the MIP images confirms the above findings. IMPRESSION: 1. Moderate quality exam for pulmonary embolism with primary limitation of motion. No embolism identified. 2. Findings of congestive heart failure, including bilateral pleural effusions and septal thickening. 3. New thoracic adenopathy since approximately 6 weeks ago. Favor secondary to fluid overload/congestive heart failure. 4. New left apical 4 mm pulmonary nodule. Favored to represent a subpleural lymph node. Non-contrast chest CT can be considered in 12 months, given risk factors for primary bronchogenic carcinoma. This recommendation follows the consensus statement: Guidelines for Management of Incidental Pulmonary Nodules Detected on CT Images: From the Fleischner Society 2017; Radiology 2017; 284:228-243. Electronically Signed   By: Abigail Miyamoto M.D.   On: 10/29/2017 22:31    Procedures Procedures (including critical care time)  Medications Ordered in ED Medications  iopamidol (ISOVUE-370) 76 % injection (has no administration in time [name])  furosemide (LASIX) injection 40 mg (has no administration in time [name])  sodium chloride 0.9 % bolus 1,000 mL (0 mLs Intravenous Stopped 10/29/17 2222)  iopamidol (ISOVUE-370) 76 % injection 100 mL (75 mLs Intravenous Contrast Given 10/29/17 2140)     Initial Impression / Assessment and Plan / ED Course  I have reviewed the triage vital signs and the nursing notes.  Pertinent labs & imaging results that were available during my care of the patient were reviewed by me and considered in my medical decision making (see chart for details).     11:45 PM On repeat exam the patient remains tachycardic, tachypneic. I reviewed findings with him, including CT not demonstrating pulmonary embolism, but labs suggesting congestive heart failure, with elevated BNP, greater than 1000, abnormal chest CT, adenopathy Findings consistent with  patient's physical exam with tachypnea, tachycardia. Patient confirms that he has no history of congestive heart failure. We discussed the importance of initiating diuretics, echocardiogram, and given his persistent tachycardia, tachypnea, evidence for any congestive heart failure, as well as ongoing evaluation for his recurrence of  tonsillar malignancy, the patient was admitted for further evaluation and management.  Final Clinical Impressions(s) / ED Diagnoses  Acute congestive heart failure   Carmin Muskrat, MD 10/29/17 2346

## 2017-10-29 NOTE — ED Triage Notes (Signed)
Pt reports hx of tonsillar cancer and is scheduled to start on chemotherapy again tomorrow. Last round was 2 months ago.

## 2017-10-29 NOTE — ED Triage Notes (Signed)
Pt reports having shortness of breath and swelling in legs for the last several days. Pt reports having feeling of dizziness that occurred today.

## 2017-10-30 ENCOUNTER — Ambulatory Visit: Payer: Self-pay | Admitting: Hematology and Oncology

## 2017-10-30 ENCOUNTER — Other Ambulatory Visit: Payer: Self-pay

## 2017-10-30 ENCOUNTER — Ambulatory Visit: Payer: Self-pay

## 2017-10-30 ENCOUNTER — Encounter (HOSPITAL_COMMUNITY): Payer: Self-pay | Admitting: Internal Medicine

## 2017-10-30 ENCOUNTER — Inpatient Hospital Stay (HOSPITAL_COMMUNITY): Payer: Medicaid Other

## 2017-10-30 DIAGNOSIS — R7989 Other specified abnormal findings of blood chemistry: Secondary | ICD-10-CM

## 2017-10-30 DIAGNOSIS — I509 Heart failure, unspecified: Secondary | ICD-10-CM

## 2017-10-30 DIAGNOSIS — Z79891 Long term (current) use of opiate analgesic: Secondary | ICD-10-CM | POA: Diagnosis not present

## 2017-10-30 DIAGNOSIS — Z923 Personal history of irradiation: Secondary | ICD-10-CM | POA: Diagnosis not present

## 2017-10-30 DIAGNOSIS — R911 Solitary pulmonary nodule: Secondary | ICD-10-CM | POA: Diagnosis present

## 2017-10-30 DIAGNOSIS — Z931 Gastrostomy status: Secondary | ICD-10-CM | POA: Diagnosis not present

## 2017-10-30 DIAGNOSIS — Z85818 Personal history of malignant neoplasm of other sites of lip, oral cavity, and pharynx: Secondary | ICD-10-CM | POA: Diagnosis not present

## 2017-10-30 DIAGNOSIS — I5021 Acute systolic (congestive) heart failure: Secondary | ICD-10-CM | POA: Diagnosis not present

## 2017-10-30 DIAGNOSIS — E8770 Fluid overload, unspecified: Secondary | ICD-10-CM | POA: Diagnosis not present

## 2017-10-30 DIAGNOSIS — N289 Disorder of kidney and ureter, unspecified: Secondary | ICD-10-CM | POA: Diagnosis not present

## 2017-10-30 DIAGNOSIS — Z87891 Personal history of nicotine dependence: Secondary | ICD-10-CM | POA: Diagnosis not present

## 2017-10-30 DIAGNOSIS — I340 Nonrheumatic mitral (valve) insufficiency: Secondary | ICD-10-CM

## 2017-10-30 DIAGNOSIS — R000 Tachycardia, unspecified: Secondary | ICD-10-CM | POA: Diagnosis present

## 2017-10-30 DIAGNOSIS — F1410 Cocaine abuse, uncomplicated: Secondary | ICD-10-CM | POA: Diagnosis present

## 2017-10-30 DIAGNOSIS — R945 Abnormal results of liver function studies: Secondary | ICD-10-CM | POA: Diagnosis not present

## 2017-10-30 DIAGNOSIS — Z7951 Long term (current) use of inhaled steroids: Secondary | ICD-10-CM | POA: Diagnosis not present

## 2017-10-30 DIAGNOSIS — N183 Chronic kidney disease, stage 3 (moderate): Secondary | ICD-10-CM

## 2017-10-30 DIAGNOSIS — Z79899 Other long term (current) drug therapy: Secondary | ICD-10-CM | POA: Diagnosis not present

## 2017-10-30 DIAGNOSIS — C029 Malignant neoplasm of tongue, unspecified: Secondary | ICD-10-CM

## 2017-10-30 DIAGNOSIS — R599 Enlarged lymph nodes, unspecified: Secondary | ICD-10-CM | POA: Diagnosis present

## 2017-10-30 DIAGNOSIS — I429 Cardiomyopathy, unspecified: Secondary | ICD-10-CM | POA: Diagnosis present

## 2017-10-30 DIAGNOSIS — I5041 Acute combined systolic (congestive) and diastolic (congestive) heart failure: Secondary | ICD-10-CM | POA: Diagnosis present

## 2017-10-30 DIAGNOSIS — Z9221 Personal history of antineoplastic chemotherapy: Secondary | ICD-10-CM | POA: Diagnosis not present

## 2017-10-30 DIAGNOSIS — R918 Other nonspecific abnormal finding of lung field: Secondary | ICD-10-CM

## 2017-10-30 DIAGNOSIS — I272 Pulmonary hypertension, unspecified: Secondary | ICD-10-CM | POA: Diagnosis present

## 2017-10-30 DIAGNOSIS — I130 Hypertensive heart and chronic kidney disease with heart failure and stage 1 through stage 4 chronic kidney disease, or unspecified chronic kidney disease: Secondary | ICD-10-CM | POA: Diagnosis present

## 2017-10-30 DIAGNOSIS — E039 Hypothyroidism, unspecified: Secondary | ICD-10-CM | POA: Diagnosis present

## 2017-10-30 LAB — COMPREHENSIVE METABOLIC PANEL
ALT: 85 U/L — ABNORMAL HIGH (ref 0–44)
AST: 57 U/L — AB (ref 15–41)
Albumin: 3.6 g/dL (ref 3.5–5.0)
Alkaline Phosphatase: 49 U/L (ref 38–126)
Anion gap: 8 (ref 5–15)
BILIRUBIN TOTAL: 1.9 mg/dL — AB (ref 0.3–1.2)
BUN: 18 mg/dL (ref 6–20)
CHLORIDE: 107 mmol/L (ref 98–111)
CO2: 27 mmol/L (ref 22–32)
Calcium: 8.4 mg/dL — ABNORMAL LOW (ref 8.9–10.3)
Creatinine, Ser: 1.55 mg/dL — ABNORMAL HIGH (ref 0.61–1.24)
GFR, EST AFRICAN AMERICAN: 59 mL/min — AB (ref >60)
GFR, EST NON AFRICAN AMERICAN: 51 mL/min — AB (ref >60)
Glucose, Bld: 97 mg/dL (ref 70–99)
POTASSIUM: 4.2 mmol/L (ref 3.5–5.1)
Sodium: 142 mmol/L (ref 135–145)
Total Protein: 6.2 g/dL — ABNORMAL LOW (ref 6.5–8.1)

## 2017-10-30 LAB — HIV ANTIBODY (ROUTINE TESTING W REFLEX): HIV SCREEN 4TH GENERATION: NONREACTIVE

## 2017-10-30 LAB — I-STAT TROPONIN, ED: Troponin i, poc: 0.08 ng/mL (ref 0.00–0.08)

## 2017-10-30 LAB — CBC
HEMATOCRIT: 40.4 % (ref 39.0–52.0)
Hemoglobin: 12.8 g/dL — ABNORMAL LOW (ref 13.0–17.0)
MCH: 29.2 pg (ref 26.0–34.0)
MCHC: 31.7 g/dL (ref 30.0–36.0)
MCV: 92 fL (ref 78.0–100.0)
Platelets: 249 10*3/uL (ref 150–400)
RBC: 4.39 MIL/uL (ref 4.22–5.81)
RDW: 15.7 % — ABNORMAL HIGH (ref 11.5–15.5)
WBC: 10.7 10*3/uL — AB (ref 4.0–10.5)

## 2017-10-30 LAB — TROPONIN I
TROPONIN I: 0.08 ng/mL — AB (ref <0.03)
TROPONIN I: 0.08 ng/mL — AB (ref <0.03)
Troponin I: 0.09 ng/mL (ref <0.03)

## 2017-10-30 LAB — ECHOCARDIOGRAM COMPLETE
HEIGHTINCHES: 78 in
Weight: 4470.4 oz

## 2017-10-30 LAB — TSH: TSH: 5.868 u[IU]/mL — ABNORMAL HIGH (ref 0.350–4.500)

## 2017-10-30 LAB — T4, FREE: FREE T4: 1.03 ng/dL (ref 0.82–1.77)

## 2017-10-30 MED ORDER — ONDANSETRON HCL 4 MG/2ML IJ SOLN
4.0000 mg | Freq: Four times a day (QID) | INTRAMUSCULAR | Status: DC | PRN
  Administered 2017-10-30 – 2017-10-31 (×4): 4 mg via INTRAVENOUS
  Filled 2017-10-30 (×4): qty 2

## 2017-10-30 MED ORDER — PERFLUTREN LIPID MICROSPHERE
1.0000 mL | INTRAVENOUS | Status: AC | PRN
  Filled 2017-10-30 (×2): qty 10

## 2017-10-30 MED ORDER — METHOCARBAMOL 500 MG PO TABS
750.0000 mg | ORAL_TABLET | Freq: Once | ORAL | Status: AC
Start: 2017-10-30 — End: 2017-10-30
  Administered 2017-10-30: 750 mg via ORAL
  Filled 2017-10-30: qty 2

## 2017-10-30 MED ORDER — LEVOTHYROXINE SODIUM 25 MCG PO TABS
25.0000 ug | ORAL_TABLET | Freq: Every day | ORAL | Status: DC
  Administered 2017-10-30 – 2017-11-02 (×4): 25 ug via ORAL
  Filled 2017-10-30 (×4): qty 1

## 2017-10-30 MED ORDER — SODIUM CHLORIDE 0.9% FLUSH
3.0000 mL | Freq: Two times a day (BID) | INTRAVENOUS | Status: DC
  Administered 2017-10-30 (×2): 3 mL via INTRAVENOUS

## 2017-10-30 MED ORDER — FUROSEMIDE 10 MG/ML IJ SOLN
20.0000 mg | Freq: Every day | INTRAMUSCULAR | Status: DC
  Administered 2017-10-30: 20 mg via INTRAVENOUS
  Filled 2017-10-30: qty 2

## 2017-10-30 MED ORDER — OXYCODONE HCL 5 MG PO TABS
15.0000 mg | ORAL_TABLET | Freq: Three times a day (TID) | ORAL | Status: DC | PRN
  Administered 2017-10-30 – 2017-10-31 (×3): 15 mg via ORAL
  Filled 2017-10-30 (×3): qty 3

## 2017-10-30 MED ORDER — NITROGLYCERIN 2 % TD OINT
0.5000 [in_us] | TOPICAL_OINTMENT | Freq: Three times a day (TID) | TRANSDERMAL | Status: DC
  Administered 2017-10-30 (×3): 0.5 [in_us] via TOPICAL
  Filled 2017-10-30: qty 30
  Filled 2017-10-30: qty 1

## 2017-10-30 MED ORDER — FUROSEMIDE 10 MG/ML IJ SOLN
40.0000 mg | Freq: Two times a day (BID) | INTRAMUSCULAR | Status: DC
  Administered 2017-10-30 – 2017-11-01 (×4): 40 mg via INTRAVENOUS
  Filled 2017-10-30 (×4): qty 4

## 2017-10-30 MED ORDER — SODIUM CHLORIDE 0.9 % IV SOLN: 250.0000 mL | INTRAVENOUS | Status: DC | PRN

## 2017-10-30 MED ORDER — CARVEDILOL 3.125 MG PO TABS
3.1250 mg | ORAL_TABLET | Freq: Two times a day (BID) | ORAL | Status: DC
  Administered 2017-10-30 – 2017-11-02 (×6): 3.125 mg via ORAL
  Filled 2017-10-30 (×6): qty 1

## 2017-10-30 MED ORDER — FLUTICASONE PROPIONATE 50 MCG/ACT NA SUSP
2.0000 | Freq: Every day | NASAL | Status: DC
  Filled 2017-10-30: qty 16

## 2017-10-30 MED ORDER — ACETAMINOPHEN 650 MG RE SUPP: 650.0000 mg | Freq: Four times a day (QID) | RECTAL | Status: DC | PRN

## 2017-10-30 MED ORDER — ENOXAPARIN SODIUM 40 MG/0.4ML ~~LOC~~ SOLN
40.0000 mg | Freq: Every day | SUBCUTANEOUS | Status: DC
  Administered 2017-10-30 – 2017-11-02 (×4): 40 mg via SUBCUTANEOUS
  Filled 2017-10-30 (×4): qty 0.4

## 2017-10-30 MED ORDER — MORPHINE SULFATE (PF) 2 MG/ML IV SOLN
1.0000 mg | Freq: Four times a day (QID) | INTRAVENOUS | Status: DC | PRN
  Administered 2017-10-30: 1 mg via INTRAVENOUS
  Filled 2017-10-30: qty 1

## 2017-10-30 MED ORDER — ALBUTEROL SULFATE (2.5 MG/3ML) 0.083% IN NEBU
3.0000 mL | INHALATION_SOLUTION | Freq: Four times a day (QID) | RESPIRATORY_TRACT | Status: DC | PRN
  Administered 2017-10-31: 3 mL via RESPIRATORY_TRACT
  Filled 2017-10-30: qty 3

## 2017-10-30 MED ORDER — SODIUM CHLORIDE 0.9% FLUSH: 3.0000 mL | INTRAVENOUS | Status: DC | PRN

## 2017-10-30 MED ORDER — ZOLPIDEM TARTRATE 10 MG PO TABS
10.0000 mg | ORAL_TABLET | Freq: Every evening | ORAL | Status: DC | PRN
  Administered 2017-10-30: 10 mg via ORAL
  Filled 2017-10-30: qty 1

## 2017-10-30 MED ORDER — ACETAMINOPHEN 325 MG PO TABS: 650.0000 mg | ORAL_TABLET | Freq: Four times a day (QID) | ORAL | Status: DC | PRN

## 2017-10-30 MED ORDER — FUROSEMIDE 10 MG/ML IJ SOLN
20.0000 mg | Freq: Once | INTRAMUSCULAR | Status: AC
  Administered 2017-10-30: 20 mg via INTRAVENOUS
  Filled 2017-10-30: qty 2

## 2017-10-30 MED ORDER — SPIRONOLACTONE 12.5 MG HALF TABLET
12.5000 mg | ORAL_TABLET | Freq: Every day | ORAL | Status: DC
  Administered 2017-10-30 – 2017-11-01 (×3): 12.5 mg via ORAL
  Filled 2017-10-30 (×3): qty 1

## 2017-10-30 NOTE — Progress Notes (Signed)
Mason [name]   DOB:Jul 30, 1965   KT#:[id number]    Assessment & Plan:  Tongue cancer His outpatient chemotherapy has been rescheduled till next week Continue supportive care  Asymptomatic lung nodules and mild lymphadenopathy We will monitor with repeat CT imaging in 3 months in the outpatient setting  Fluid overload Congestive heart failure Further investigation in progress.  Echocardiogram is pending.  He is diuresing well.  Would defer to primary service for further management  Chronic kidney disease stage III He has baseline chronic kidney disease stage III due to prior chemotherapy side effects We will monitor serum creatinine carefully.  Elevated TSH Likely due to his recent immunotherapy and prior radiation exposure He will continue levothyroxine  Elevated total bilirubin Ultrasound in progress  CODE STATUS Full code  Discharge planning Will defer to primary service We will follow  Mason Lark, MD 10/30/2017  2:21 PM   Subjective:  Patient is well-known to me.  He was supposed to resume outpatient treatment with pembrolizumab.  He was admitted to the hospital after presentation with shortness of breath.  Since admission to the hospital, he was diagnosed with possible congestive heart failure.  CT imaging did not show evidence of disease progression The patient is currently on aggressive medical management and has diuresed very well since admission  He felt better.  Shortness of breath has improved.  He has mild intermittent coughing but not severe. He denies fever.  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 FNA of right level III lymph node was inconclusive for cancer      07/25/2013 Imaging    PET scan showed locally advanced disease with abnormal lymphadenopathy in the right axilla      08/06/2013 Procedure    CT-guided biopsy of the lymphadenopathy was negative for malignancy      08/15/2013 Surgery    Patient has placement of port and feeding tube      08/19/2013 - 09/10/2013 Chemotherapy    Patient received chemotherapy with cisplatin. The patient only received 2 doses due to uncontrolled nausea and acute renal failure.      08/19/2013 - 10/15/2013 Radiation Therapy    Received Helical IMRT Tomotherapy:  Right Tonstil and bilateral neck / 70 Gy in 35 fractions to gross disease, 63 Gy in 35 fractions to high risk nodal echelons, and 56 Gy in 35 fractions to intermediate risk nodal echelons.      08/27/2013 - 08/30/2013 Hospital Admission    The patient was admitted to the hospital for uncontrolled nausea vomiting and dehydration.      02/14/2014 Imaging    PET/CT scan showed complete response to treatment      03/19/2014 Surgery    He had excisional lymph node biopsy from the right neck. Pathology was benign      05/13/2014 Surgery    He had removal of Port-A-Cath.      05/22/2014 Imaging    Repeat CT scan of the neck show no evidence of disease recurrence.      12/09/2014 Imaging    Ct neck without  contrast showed persistent abnormalities on the right side of neck, indeterminate      12/25/2014 Imaging    PET CT scan showed disease recurrence.      02/10/2015 Procedure    He has placement of port      02/13/2015 - 07/13/2015 Chemotherapy    He received chemotherapy with carbo/Taxol      04/21/2015 Imaging    PET CT scan showed improved disease control      08/04/2015 Imaging    PET scan showed persistent disease      08/17/2015 -  Chemotherapy    He was started with [facility]      10/19/2015 Imaging    Ct neck showed mass-like intermediate  density soft tissue at the right lateral neck recurrence site stable.       02/26/2016 Imaging    CT neck showed unchanged appearance of right neck recurrence compared to 10/19/2015 CT. No noncontrast evidence of new metastatic disease in the neck. Chronic sinusitis, progressed.      02/26/2016 Imaging    Diffuse but patchy and asymmetric partial airspace filling process (ground-glass opacity) in the lungs. This could be due to respiratory bronchiolitis, hypersensitivity pneumonitis or possible drug reaction. Atypical/viral pneumonia is also possible. Pulmonary consultation may be a helpful. A three-month follow-up noncontrast chest CT is suggested. Slight interval enlargement of mediastinal lymph nodes and a small lymph node along the left major fissure. This is most likely due to the inflammatory process in the lungs. No findings for metastatic disease involving the chest. No findings for upper abdominal metastatic disease.      02/29/2016 Adverse Reaction    His treatment is placed on hold due to possible hypersensitivity pneumonitis/drug reaction      04/14/2016 Imaging    Ct chest showed no evidence for metastatic disease within the chest. Significant interval improvement and near complete resolution of previously described diffuse bilateral predominately ground-glass pulmonary opacities, most compatible with resolving infectious/inflammatory process.      09/05/2016 Imaging    CT scan of neck and chest  1. Unchanged appearance of right neck recurrence. 2. No evidence of new metastatic disease in the neck. 3. Unremarkable and stable CT appearance of the chest. No findings suspicious for metastatic disease      07/24/2017 Imaging    1. No findings suspicious for metastatic disease in the chest. 2. No acute consolidative airspace disease to suggest a pneumonia. 3. No appreciable change in chronic mild patchy upper lung predominant centrilobular micronodularity. If the patient is a  current smoker, these findings are most compatible with smoking related interstitial lung disease. If the patient is not a current smoker, these findings suggest subacute hypersensitivity pneumonitis  or postinflammatory change. 4. Stable mild biapical radiation fibrosis. 5. Mild to moderate centrilobular emphysema with diffuse bronchial wall thickening, suggesting COPD. 6. One vessel coronary atherosclerosis.  Aortic Atherosclerosis (ICD10-I70.0) and Emphysema (ICD10-J43.9).      09/15/2017 Imaging    1. No evidence of interstitial lung disease. 2. Emphysema (ICD10-J43.9).      10/29/2017 Imaging    1. Moderate quality exam for pulmonary embolism with primary limitation of motion. No embolism identified. 2. Findings of congestive heart failure, including bilateral pleural effusions and septal thickening. 3. New thoracic adenopathy since approximately 6 weeks ago. Favor secondary to fluid overload/congestive heart failure. 4. New left apical 4 mm pulmonary nodule. Favored to represent a subpleural lymph node. Non-contrast chest CT can be considered in 12 months, given risk factors for  primary bronchogenic carcinoma. This recommendation follows the consensus statement: Guidelines for Management of Incidental Pulmonary Nodules Detected on CT Images: From the Fleischner Society 2017; Radiology 2017; 284:228-243.        Objective:  Vitals:   10/30/17 0406 10/30/17 1324  BP: (!) 146/116 (!) 122/101  Pulse: 74 98  Resp: (!) 22 18  Temp: 97.7 F (36.5 C) 98.1 F (36.7 C)  SpO2: 99% 94%     Intake/Output Summary (Last 24 hours) at 10/30/2017 1421 Last data filed at 10/30/2017 1008 Gross per 24 hour  Intake 120 ml  Output 3975 ml  Net -3855 ml    GENERAL:alert, no distress and comfortable SKIN: skin color, texture, turgor are normal, no rashes or significant lesions EYES: normal, Conjunctiva are pink and non-injected, sclera clear OROPHARYNX:no exudate, no erythema and lips, buccal  mucosa, and tongue normal  NECK: supple, thyroid normal size, non-tender, without nodularity LYMPH:  no palpable lymphadenopathy in the cervical, axillary or inguinal LUNGS: clear to auscultation and percussion with normal breathing effort HEART: regular rate & rhythm and no murmurs and no lower extremity edema ABDOMEN:abdomen soft, non-tender and normal bowel sounds Musculoskeletal:no cyanosis of digits and no clubbing  NEURO: alert & oriented x 3 with fluent speech, no focal motor/sensory deficits   Labs:  Lab Results  Component Value Date   WBC 10.7 (H) 10/30/2017   HGB 12.8 (L) 10/30/2017   HCT 40.4 10/30/2017   MCV 92.0 10/30/2017   PLT 249 10/30/2017   NEUTROABS 7.1 10/29/2017    Lab Results  Component Value Date   NA 142 10/30/2017   K 4.2 10/30/2017   CL 107 10/30/2017   CO2 27 10/30/2017    Studies:  Dg Chest 2 View  Result Date: 10/29/2017 CLINICAL DATA:  Chest pain and shortness of breath with head neck cancer. EXAM: CHEST - 2 VIEW COMPARISON:  09/15/2017 CT.  07/13/2017 plain film. FINDINGS: Moderate lower thoracic spondylosis. Right Port-A-Cath which terminates at the low SVC. Midline trachea. Borderline cardiomegaly. Mediastinal contours otherwise within normal limits. No pleural effusion or pneumothorax. Diffuse peribronchial thickening. No lobar consolidation. IMPRESSION: No acute cardiopulmonary disease. Peribronchial thickening which may relate to chronic bronchitis or smoking. Electronically Signed   By: Abigail Miyamoto M.D.   On: 10/29/2017 20:23   Ct Angio Chest Pe W/cm &/or Wo Cm  Result Date: 10/29/2017 CLINICAL DATA:  Shortness of breath and leg swelling for several days. Dizziness. EXAM: CT ANGIOGRAPHY CHEST WITH CONTRAST TECHNIQUE: Multidetector CT imaging of the chest was performed using the standard protocol during bolus administration of intravenous contrast. Multiplanar CT image reconstructions and MIPs were obtained to evaluate the vascular anatomy.  CONTRAST:  64mL ISOVUE-370 IOPAMIDOL (ISOVUE-370) INJECTION 76% COMPARISON:  Plain films of earlier today.  CT 09/15/2017. FINDINGS: Cardiovascular: The quality of this exam for evaluation of pulmonary embolism is moderate. The bolus is well timed. The primary limitation is motion. No evidence of pulmonary embolism. A right-sided Port-A-Cath terminates at the superior caval/atrial junction. Mild cardiomegaly, without pericardial effusion. The aorta is normal in caliber but not well opacified secondary to bolus timing. Mediastinum/Nodes: Precarinal node measures 1.3 cm and is newly enlarged. A node within the azygoesophageal recess is newly enlarged at 1.9 cm on 51/5. Right hilar adenopathy at 1.9 cm. Prevascular node of 10 mm is enlarged from 7 mm on the prior. Lungs/Pleura: Small pleural effusions, larger on the right. Mild to moderate motion degradation, most significant inferiorly. Mild centrilobular emphysema with superimposed mild interstitial edema,  as evidenced by areas of heterogeneous ground-glass opacity and smooth septal thickening. A vague left apical 4 mm nodule on 45/11 is new since the prior, favored to represent a subpleural lymph node. Upper Abdomen: Contrast reflux into the hepatic veins and IVC, suggesting elevated right heart pressures. Motion degradation continuing into the upper abdomen. Normal imaged portions of the liver, spleen, stomach, pancreas, gallbladder, biliary tract, adrenal glands, kidneys. Musculoskeletal: Moderate thoracic spondylosis. Review of the MIP images confirms the above findings. IMPRESSION: 1. Moderate quality exam for pulmonary embolism with primary limitation of motion. No embolism identified. 2. Findings of congestive heart failure, including bilateral pleural effusions and septal thickening. 3. New thoracic adenopathy since approximately 6 weeks ago. Favor secondary to fluid overload/congestive heart failure. 4. New left apical 4 mm pulmonary nodule. Favored to  represent a subpleural lymph node. Non-contrast chest CT can be considered in 12 months, given risk factors for primary bronchogenic carcinoma. This recommendation follows the consensus statement: Guidelines for Management of Incidental Pulmonary Nodules Detected on CT Images: From the Fleischner Society 2017; Radiology 2017; 284:228-243. Electronically Signed   By: Abigail Miyamoto M.D.   On: 10/29/2017 22:31

## 2017-10-30 NOTE — Progress Notes (Signed)
  Echocardiogram 2D Echocardiogram has been performed.  Jannett Celestine 10/30/2017, 3:50 PM

## 2017-10-30 NOTE — Progress Notes (Signed)
PROGRESS NOTE [facility]   Mason [name]   EUM:[id number] DOB: 1965-06-19  DOA: 10/29/2017 PCP: Patient, [name]   Brief Narrative:  Mason [name] is a 52 y.o. male with a past medical history significant for CKD III, elevated blood pressure, and tonsillar cancer (squamous cell carcinoma). He presented to the emergency room with a 4 week history of worsening dyspnea, which got significantly worse over the weekend. He also endorsed weight gain, lower extremity edema, orthopnea and substernal CP episodes that were provoked by coughing. He was treated recently with prednisone for pneumonitis. He has not had treatment for his tonsillar cancer in 2 months but is scheduled for a Keytruda infusion. CTA showed bilateral pleural effusions and septal thickening, new thoracic adenopathy, and a new 4 mm pulmonary nodule. BNP 1406.6. Patient received 40 mg IV lasix in the emergency room. He was admitted on telemetry with the working diagnosis of dyspnea secondary to CHF.  Subjective: Patient is feeling much better this morning. He reports that his SOB, cough, and LE edema are much improved today, though still present.  He was able to sleep well for the first time in days. He did report one episode of nausea and vomiting after breakfast this morning, but that this occasionally happens when he has not eaten in many hours. He received zofran and no longer feels nauseous. Denies any additional vomiting, diarrhea, fevers/chills, wheezing, hemoptysis, or abdominal pain.  Assessment & Plan: Dyspnea secondary to CHF  - Continue 40 mg IV Lasix BID - he is responding well to diuresis. Net output of 2.9 L yesterday.  - Nitro paste topically every 8 hours - Awaiting results of cardiac echo scheduled for today. Consider a beta blocker.   Chronic Kidney Disease III - sCr today is 1.55. Monitor with daily CMP. Continue IV hydration.  Hypothyroidism - TSH elevated at 5.868. Free T4 normal at 1.03.  -  Continue with Synthroid 25 mcg daily.   Elevated BP  - Continue to closely monitor BP. Improving with Lasix.  Abnormal LFTs - Awaiting hepatitis panel. Consider CT to assess for metastatic disease.   Tonsillar Carcinoma  -Patient is scheduled for Keytruda infusion. Will discuss with primary oncologist, Dr. Alvy Bimler.   DVT prophylaxis: Subcutaneous Lovenox, SCDs Code Status: FULL Family Communication: None Disposition Plan: Home 1-2 days   Consultants:   None  Procedures:   Echocardiogram   Antimicrobials:  None   Objective: Vitals:   10/29/17 2335 10/30/17 0000 10/30/17 0156 10/30/17 0406  BP:  (!) 136/114 (!) 132/113 (!) 146/116  Pulse:  98 (!) 103 74  Resp: 20  (!) 23 (!) 22  Temp:    97.7 F (36.5 C)  TempSrc:    Oral  SpO2: 98% 95% 99% 99%  Weight:    126.7 kg (279 lb 6.4 oz)  Height:    6\' 6"  (1.981 m)    Intake/Output Summary (Last 24 hours) at 10/30/2017 1306 Last data filed at 10/30/2017 1008 Gross per 24 hour  Intake 120 ml  Output 3975 ml  Net -3855 ml   Filed Weights   10/29/17 1930 10/30/17 0406  Weight: 126.1 kg (278 lb) 126.7 kg (279 lb 6.4 oz)   Examination:  General exam: Patient is awake and alert, in NAD. Appears calm and comfortable  Respiratory system: Crackles present in lower lung fields. Good air entry. Cardiovascular system: S1 & S2 heard, RRR. No JVD, murmurs, rubs or gallops Gastrointestinal system: Abdomen is nondistended, soft and nontender. No organomegaly  or masses felt. Normal bowel sounds heard. Central nervous system: Alert and oriented. No focal neurological deficits. Extremities: +1 pitting edema bilateral lower extremities. No cyanosis. Distal pulses intact.  Skin: No rashes, lesions or ulcers Psychiatry: Judgement and insight appear normal. Mood & affect appropriate.   Data Reviewed: I have personally reviewed following labs and imaging studies  CBC: Recent Labs  Lab 10/29/17 1942 10/30/17 0548  WBC 8.2 10.7*    NEUTROABS 7.1  --   HGB 12.2* 12.8*  HCT 38.4* 40.4  MCV 92.8 92.0  PLT 222 151   Basic Metabolic Panel: Recent Labs  Lab 10/29/17 1942 10/30/17 0548  NA 143 142  K 4.1 4.2  CL 108 107  CO2 28 27  GLUCOSE 100* 97  BUN 20 18  CREATININE 1.59* 1.55*  CALCIUM 8.5* 8.4*   GFR: Estimated Creatinine Clearance: 85.1 mL/min (A) (by C-G formula based on SCr of 1.55 mg/dL (H)).   Liver Function Tests: Recent Labs  Lab 10/29/17 1942 10/30/17 0548  AST 46* 57*  ALT 70* 85*  ALKPHOS 47 49  BILITOT 1.0 1.9*  PROT 5.6* 6.2*  ALBUMIN 3.3* 3.6   Cardiac Enzymes: Recent Labs  Lab 10/30/17 0515  TROPONINI 0.08*   Thyroid Function Tests: Recent Labs    10/30/17 0515  TSH 5.868*   Sepsis Labs: Recent Labs  Lab 10/29/17 1950 10/29/17 2315  LATICACIDVEN 0.97 0.85    No results found for this or any previous visit (from the past 240 hour(s)).   Radiology Studies: Dg Chest 2 View  Result Date: 10/29/2017 CLINICAL DATA:  Chest pain and shortness of breath with head neck cancer. EXAM: CHEST - 2 VIEW COMPARISON:  09/15/2017 CT.  07/13/2017 plain film. FINDINGS: Moderate lower thoracic spondylosis. Right Port-A-Cath which terminates at the low SVC. Midline trachea. Borderline cardiomegaly. Mediastinal contours otherwise within normal limits. No pleural effusion or pneumothorax. Diffuse peribronchial thickening. No lobar consolidation. IMPRESSION: No acute cardiopulmonary disease. Peribronchial thickening which may relate to chronic bronchitis or smoking. Electronically Signed   By: Abigail Miyamoto M.D.   On: 10/29/2017 20:23   Ct Angio Chest Pe W/cm &/or Wo Cm  Result Date: 10/29/2017 CLINICAL DATA:  Shortness of breath and leg swelling for several days. Dizziness. EXAM: CT ANGIOGRAPHY CHEST WITH CONTRAST TECHNIQUE: Multidetector CT imaging of the chest was performed using the standard protocol during bolus administration of intravenous contrast. Multiplanar CT image reconstructions  and MIPs were obtained to evaluate the vascular anatomy. CONTRAST:  20mL ISOVUE-370 IOPAMIDOL (ISOVUE-370) INJECTION 76% COMPARISON:  Plain films of earlier today.  CT 09/15/2017. FINDINGS: Cardiovascular: The quality of this exam for evaluation of pulmonary embolism is moderate. The bolus is well timed. The primary limitation is motion. No evidence of pulmonary embolism. A right-sided Port-A-Cath terminates at the superior caval/atrial junction. Mild cardiomegaly, without pericardial effusion. The aorta is normal in caliber but not well opacified secondary to bolus timing. Mediastinum/Nodes: Precarinal node measures 1.3 cm and is newly enlarged. A node within the azygoesophageal recess is newly enlarged at 1.9 cm on 51/5. Right hilar adenopathy at 1.9 cm. Prevascular node of 10 mm is enlarged from 7 mm on the prior. Lungs/Pleura: Small pleural effusions, larger on the right. Mild to moderate motion degradation, most significant inferiorly. Mild centrilobular emphysema with superimposed mild interstitial edema, as evidenced by areas of heterogeneous ground-glass opacity and smooth septal thickening. A vague left apical 4 mm nodule on 45/11 is new since the prior, favored to represent a subpleural lymph  node. Upper Abdomen: Contrast reflux into the hepatic veins and IVC, suggesting elevated right heart pressures. Motion degradation continuing into the upper abdomen. Normal imaged portions of the liver, spleen, stomach, pancreas, gallbladder, biliary tract, adrenal glands, kidneys. Musculoskeletal: Moderate thoracic spondylosis. Review of the MIP images confirms the above findings. IMPRESSION: 1. Moderate quality exam for pulmonary embolism with primary limitation of motion. No embolism identified. 2. Findings of congestive heart failure, including bilateral pleural effusions and septal thickening. 3. New thoracic adenopathy since approximately 6 weeks ago. Favor secondary to fluid overload/congestive heart failure.  4. New left apical 4 mm pulmonary nodule. Favored to represent a subpleural lymph node. Non-contrast chest CT can be considered in 12 months, given risk factors for primary bronchogenic carcinoma. This recommendation follows the consensus statement: Guidelines for Management of Incidental Pulmonary Nodules Detected on CT Images: From the Fleischner Society 2017; Radiology 2017; 284:228-243. Electronically Signed   By: Abigail Miyamoto M.D.   On: 10/29/2017 22:31   Scheduled Meds: . enoxaparin (LOVENOX) injection  40 mg Subcutaneous Daily  . fluticasone  2 spray Each Nare Daily  . furosemide  40 mg Intravenous Q12H  . levothyroxine  25 mcg Oral QAC breakfast  . nitroGLYCERIN  0.5 inch Topical Q8H  . sodium chloride flush  3 mL Intravenous Q12H   Continuous Infusions: . sodium chloride      LOS: 0 days   Time spent: Total of 20 minutes spent with pt.  Hartman Minahan, PA-S  If 7PM-7AM, please contact night-coverage [email or web address] 10/30/2017, 1:06 PM

## 2017-10-30 NOTE — H&P (Signed)
TRH H&P   Patient Demographics:    Mason [name], is a 52 y.o. male  MRN: [id number]   DOB - April 29, 1965  Admit Date - 10/29/2017  Outpatient Primary MD for the patient is Patient, [name]  Referring MD/NP/PA: Carmin Muskrat  Outpatient Specialists:   [name]  Patient coming from:  home  Chief Complaint  Patient presents with  . Shortness of Breath  . Dizziness      HPI:    Mason [name]  is a 52 y.o. male, w hypertension, tonsillar cancer (SCCA R tonsil, recurrent 2016), apparently presents with c/o dyspnea x 4 weeks. Pt had recent episode of pneumonitis tx with prednisone.   Pt notes weight gain.  Pt has had lower ext edema over the past 4 days as well as substernal chest pain lasting seconds, worse with breathing over the past 5 days.  + orthopnea.  Pt presented to ED due to dyspnea.   In ED, pox 94% on RA CTA chest IMPRESSION: 1. Moderate quality exam for pulmonary embolism with primary limitation of motion. No embolism identified. 2. Findings of congestive heart failure, including bilateral pleural effusions and septal thickening. 3. New thoracic adenopathy since approximately 6 weeks ago. Favor secondary to fluid overload/congestive heart failure. 4. New left apical 4 mm pulmonary nodule. Favored to represent a subpleural lymph node. Non-contrast chest CT can be considered in 12 months, given risk factors for primary bronchogenic carcinoma. This recommendation follows the consensus statement: Guidelines for Management of Incidental Pulmonary Nodules Detected on CT Images: From the Fleischner Society 2017; Radiology 2017; 284:228-243.   Na 143, K 4.1, Bun 20, Creatinine 1.59 Ast 46, Alt 70, Alk phos 47, T. Bili 1.0  Wbc 8.2, Hgb 12.2, Plt 222 BNP 1,406.6  Pt will be admitted for CHF       Review of systems:    In addition to the HPI above,  No  Fever-chills, No Headache, No changes with Vision or hearing, No problems swallowing food or Liquids,  No Abdominal pain, No Nausea or Vommitting, Bowel movements are regular, No Blood in stool or Urine, No dysuria, No new skin rashes or bruises, No new joints pains-aches,  No new weakness, tingling, numbness in any extremity, No recent weight gain or loss, No polyuria, polydypsia or polyphagia, No significant Mental Stressors.  A full 10 point Review of Systems was done, except as stated above, all other Review of Systems were negative.   With Past History of the following :    Past Medical History:  Diagnosis Date  . Abnormal liver function test   . Acute sinusitis, unspecified 05/18/2015  . Anemia   . Anxiety    mild new dx  . Arthritis    knees,hips  . Bilateral edema of lower extremity   . Complication of anesthesia    Pt stated " my oxygen level was slow  in rising."  . Concussion    Hx: in high school x 2  . Constipation   . Dysuria 09/04/2015  . Fever   . Hyperactive gag reflex   . Hypertension   . Hypoglycemia   . Hypokalemia   . Insomnia 06/15/2015  . Knee pain, chronic   . Malnutrition ([place])   . Non-healing surgical wound 05/23/2014  . PEG (percutaneous endoscopic gastrostomy) status ([place])   . Renal failure, acute ([place])   . S/P radiation therapy 08/19/2013-10/15/2013   Right Tonstil and bilateral neck / 70 Gy in 35 fractions to gross disease, 63 Gy in 35 fractions to high risk nodal echelons, and 56 Gy in 35 fractions to intermediate risk nodal echelons  . Severe nausea and vomiting   . Status post chemotherapy    Only received 2 doses due to uncontrolled nausea and acute renal failure  . Tonsillar cancer ([place]) 07/09/13   SCCA of Right Tonsil, recurrent 2016      Past Surgical History:  Procedure Laterality Date  . LAPAROSCOPIC GASTROSTOMY N/A 08/15/2013   Procedure: LAPAROSCOPIC GASTROSTOMY TUBE PLACEMENT;  Surgeon: [name], MD;  Location: [facility];   Service: General;  Laterality: N/A;  . LYMPH NODE BIOPSY  03/20/14   right neck  . MULTIPLE EXTRACTIONS WITH ALVEOLOPLASTY N/A 08/01/2013   Procedure: Extraction of tooth #'s 1,15,17,31, 32 with alveoloplasty, mandibular left torus reduction, and gross debridement of remaining teeth.;  Surgeon: [name], DDS;  Location: [facility];  Service: Oral Surgery;  Laterality: N/A;  . PORT-A-CATH REMOVAL N/A 05/13/2014   Procedure: REMOVAL of PORT-A-CATH;  Surgeon: [name], MD;  Location: [facility];  Service: General;  Laterality: N/A;  . PORTACATH PLACEMENT Left 08/15/2013   Procedure: INSERTION PORT-A-CATH;  Surgeon: [name], MD;  Location: [facility];  Service: General;  Laterality: Left;      Social History:     Social History   Tobacco Use  . Smoking status: Former Smoker    Packs/day: 1.00    Years: 20.00    Pack years: 20.00    Types: Cigarettes    Last attempt to quit: 07/16/2003    Years since quitting: 14.3  . Smokeless tobacco: Never Used  Substance Use Topics  . Alcohol use: No    Comment: none years ago     Lives - at home with wife and daughter  Mobility - walks by self   Family History :     Family History  Problem Relation Age of Onset  . Arthritis Mother   . Arthritis Father   . CVA Unknown   . Diabetes Unknown   . Heart attack Unknown        Home Medications:   Prior to Admission medications   Medication Sig Start Date End Date Taking? Authorizing Provider  albuterol (PROVENTIL HFA;VENTOLIN HFA) 108 (90 Base) MCG/ACT inhaler Inhale 2 puffs into the lungs every 6 (six) hours as needed for wheezing or shortness of breath. 10/09/17  Yes Rigoberto Noel, MD  chlorhexidine (PERIDEX) 0.12 % solution Rinse with 15 mls twice daily for 30 seconds. Use after breakfast and at bedtime. Spit out excess. Do not swallow. 02/09/17  Yes [name], DDS  fluticasone (FLONASE) 50 MCG/ACT nasal spray Place 2 sprays into both nostrils daily. 09/06/16  Yes Gorsuch,  Ni, MD  levothyroxine (SYNTHROID, LEVOTHROID) 25 MCG tablet TAKE 1 TABLET BY MOUTH DAILY BEFORE BREAKFAST. 05/08/17  Yes Alvy Bimler, Ni, MD  lidocaine-prilocaine (EMLA) cream Apply 1  application topically as needed. 06/15/17  Yes Gorsuch, Ni, MD  oxyCODONE (ROXICODONE) 15 MG immediate release tablet Take 1 tablet (15 mg total) by mouth every 8 (eight) hours as needed. 10/03/17  Yes [name], MD     Allergies:     Allergies  Allergen Reactions  . Pollen Extract Other (See Comments)    Watery eyes, runny nose, sneezing     Physical Exam:   Vitals  Blood pressure (!) 136/118, pulse (!) 102, temperature 98.7 F (37.1 C), temperature source Oral, resp. rate 20, height '6\' 6"'  (1.981 m), weight 126.1 kg (278 lb), SpO2 98 %.   1. General  lying in bed in NAD,    2. Normal affect and insight, Not Suicidal or Homicidal, Awake Alert, Oriented X 3.  3. No F.N deficits, ALL C.Nerves Intact, Strength 5/5 all 4 extremities, Sensation intact all 4 extremities, Plantars down going.  4. Ears and Eyes appear Normal, Conjunctivae clear, PERRLA. Moist Oral Mucosa.  5. Supple Neck, No JVD, No cervical lymphadenopathy appriciated, No Carotid Bruits.  6. Symmetrical Chest wall movement, Good air movement bilaterally, bibasilar crackles, no wheezing   7. RRR, No Gallops, Rubs or Murmurs, No Parasternal Heave.  8. Positive Bowel Sounds, Abdomen Soft, No tenderness, No organomegaly appriciated,No rebound -guarding or rigidity.  9.  No Cyanosis, Normal Skin Turgor, No Skin Rash or Bruise.  10. Good muscle tone,  joints appear normal , no effusions, Normal ROM.  11. No Palpable Lymph Nodes in Neck or Axillae      Data Review:    CBC Recent Labs  Lab 10/29/17 1942  WBC 8.2  HGB 12.2*  HCT 38.4*  PLT 222  MCV 92.8  MCH 29.5  MCHC 31.8  RDW 15.9*  LYMPHSABS 0.6*  MONOABS 0.4  EOSABS 0.1  BASOSABS 0.0    ------------------------------------------------------------------------------------------------------------------  Chemistries  Recent Labs  Lab 10/29/17 1942  NA 143  K 4.1  CL 108  CO2 28  GLUCOSE 100*  BUN 20  CREATININE 1.59*  CALCIUM 8.5*  AST 46*  ALT 70*  ALKPHOS 47  BILITOT 1.0   ------------------------------------------------------------------------------------------------------------------ estimated creatinine clearance is 82.8 mL/min (A) (by C-G formula based on SCr of 1.59 mg/dL (H)). ------------------------------------------------------------------------------------------------------------------ No results for input(s): TSH, T4TOTAL, T3FREE, THYROIDAB in the last 72 hours.  Invalid input(s): FREET3  Coagulation profile No results for input(s): INR, PROTIME in the last 168 hours. ------------------------------------------------------------------------------------------------------------------- No results for input(s): DDIMER in the last 72 hours. -------------------------------------------------------------------------------------------------------------------  Cardiac Enzymes No results for input(s): CKMB, TROPONINI, MYOGLOBIN in the last 168 hours.  Invalid input(s): CK ------------------------------------------------------------------------------------------------------------------    Component Value Date/Time   BNP 1,406.6 (H) 10/29/2017 1942     ---------------------------------------------------------------------------------------------------------------  Urinalysis    Component Value Date/Time   COLORURINE YELLOW 10/29/2017 2252   APPEARANCEUR CLEAR 10/29/2017 2252   LABSPEC 1.039 (H) 10/29/2017 2252   LABSPEC 1.015 09/07/2015 0922   PHURINE 5.0 10/29/2017 2252   GLUCOSEU NEGATIVE 10/29/2017 2252   GLUCOSEU Negative 09/07/2015 0922   HGBUR NEGATIVE 10/29/2017 2252   BILIRUBINUR NEGATIVE 10/29/2017 2252   BILIRUBINUR Negative  09/07/2015 0922   KETONESUR NEGATIVE 10/29/2017 2252   PROTEINUR 30 (A) 10/29/2017 2252   UROBILINOGEN 0.2 09/07/2015 0922   NITRITE NEGATIVE 10/29/2017 2252   LEUKOCYTESUR TRACE (A) 10/29/2017 2252   LEUKOCYTESUR Trace 09/07/2015 0922    ----------------------------------------------------------------------------------------------------------------   Imaging Results:    Dg Chest 2 View  Result Date: 10/29/2017 CLINICAL DATA:  Chest pain and shortness of breath  with head neck cancer. EXAM: CHEST - 2 VIEW COMPARISON:  09/15/2017 CT.  07/13/2017 plain film. FINDINGS: Moderate lower thoracic spondylosis. Right Port-A-Cath which terminates at the low SVC. Midline trachea. Borderline cardiomegaly. Mediastinal contours otherwise within normal limits. No pleural effusion or pneumothorax. Diffuse peribronchial thickening. No lobar consolidation. IMPRESSION: No acute cardiopulmonary disease. Peribronchial thickening which may relate to chronic bronchitis or smoking. Electronically Signed   By: Abigail Miyamoto M.D.   On: 10/29/2017 20:23   Ct Angio Chest Pe W/cm &/or Wo Cm  Result Date: 10/29/2017 CLINICAL DATA:  Shortness of breath and leg swelling for several days. Dizziness. EXAM: CT ANGIOGRAPHY CHEST WITH CONTRAST TECHNIQUE: Multidetector CT imaging of the chest was performed using the standard protocol during bolus administration of intravenous contrast. Multiplanar CT image reconstructions and MIPs were obtained to evaluate the vascular anatomy. CONTRAST:  19m ISOVUE-370 IOPAMIDOL (ISOVUE-370) INJECTION 76% COMPARISON:  Plain films of earlier today.  CT 09/15/2017. FINDINGS: Cardiovascular: The quality of this exam for evaluation of pulmonary embolism is moderate. The bolus is well timed. The primary limitation is motion. No evidence of pulmonary embolism. A right-sided Port-A-Cath terminates at the superior caval/atrial junction. Mild cardiomegaly, without pericardial effusion. The aorta is normal in  caliber but not well opacified secondary to bolus timing. Mediastinum/Nodes: Precarinal node measures 1.3 cm and is newly enlarged. A node within the azygoesophageal recess is newly enlarged at 1.9 cm on 51/5. Right hilar adenopathy at 1.9 cm. Prevascular node of 10 mm is enlarged from 7 mm on the prior. Lungs/Pleura: Small pleural effusions, larger on the right. Mild to moderate motion degradation, most significant inferiorly. Mild centrilobular emphysema with superimposed mild interstitial edema, as evidenced by areas of heterogeneous ground-glass opacity and smooth septal thickening. A vague left apical 4 mm nodule on 45/11 is new since the prior, favored to represent a subpleural lymph node. Upper Abdomen: Contrast reflux into the hepatic veins and IVC, suggesting elevated right heart pressures. Motion degradation continuing into the upper abdomen. Normal imaged portions of the liver, spleen, stomach, pancreas, gallbladder, biliary tract, adrenal glands, kidneys. Musculoskeletal: Moderate thoracic spondylosis. Review of the MIP images confirms the above findings. IMPRESSION: 1. Moderate quality exam for pulmonary embolism with primary limitation of motion. No embolism identified. 2. Findings of congestive heart failure, including bilateral pleural effusions and septal thickening. 3. New thoracic adenopathy since approximately 6 weeks ago. Favor secondary to fluid overload/congestive heart failure. 4. New left apical 4 mm pulmonary nodule. Favored to represent a subpleural lymph node. Non-contrast chest CT can be considered in 12 months, given risk factors for primary bronchogenic carcinoma. This recommendation follows the consensus statement: Guidelines for Management of Incidental Pulmonary Nodules Detected on CT Images: From the Fleischner Society 2017; Radiology 2017; 284:228-243. Electronically Signed   By: KAbigail MiyamotoM.D.   On: 10/29/2017 22:31   ekg  nsr at 100, nl axis, no st-t changes c/w  ischemia   Assessment & Plan:    Active Problems:   CHF (congestive heart failure) (HCC)    Dyspnea secondary to CHF Tele Trop I q6h x3 Check tsh Cardiac echo Lasix 41miv x1 in ED Nitro paste 1/2 inch topically q8h Lasix 2054mv qday Consider addition of beta blocker later during stay  Renal insufficiency  Check cmp in am  Hypothyroidism Cont levothyroxine 25 micrograms po qday Check TSH  Abnormal liver function Check acute hepatitis panel Consider imaging with CT scan to see if metastatic disease  Tonsillar cancer Scheduled for chemo  Keytruda , please contact Dr. Alvy Bimler in [place] regarding this.  ? Beryle Flock can cause peripheral edema, not certain about CHF, please discuss with oncology    DVT Prophylaxis  Lovenox - SCDs  AM Labs Ordered, also please review Full Orders  Family Communication: Admission, patients condition and plan of care including tests being ordered have been discussed with the patient  who indicate understanding and agree with the plan and Code Status.  Code Status FULL CODE  Likely DC to  home  Condition GUARDED    Consults called:  none  Admission status: inpatient  Time spent in minutes : 60   Jani Gravel M.D on 10/30/2017 at 12:11 AM  Between 7am to 7pm - Pager - [phone] . After 7pm go to [email or web address] - password [facility]  [facility] - Office  [phone]

## 2017-10-31 ENCOUNTER — Inpatient Hospital Stay (HOSPITAL_COMMUNITY): Payer: Medicaid Other

## 2017-10-31 ENCOUNTER — Encounter (HOSPITAL_COMMUNITY): Payer: Self-pay | Admitting: Cardiology

## 2017-10-31 DIAGNOSIS — I5021 Acute systolic (congestive) heart failure: Secondary | ICD-10-CM

## 2017-10-31 LAB — BASIC METABOLIC PANEL
ANION GAP: 8 (ref 5–15)
BUN: 20 mg/dL (ref 6–20)
CO2: 28 mmol/L (ref 22–32)
Calcium: 7.7 mg/dL — ABNORMAL LOW (ref 8.9–10.3)
Chloride: 104 mmol/L (ref 98–111)
Creatinine, Ser: 1.63 mg/dL — ABNORMAL HIGH (ref 0.61–1.24)
GFR, EST AFRICAN AMERICAN: 55 mL/min — AB (ref >60)
GFR, EST NON AFRICAN AMERICAN: 48 mL/min — AB (ref >60)
Glucose, Bld: 90 mg/dL (ref 70–99)
POTASSIUM: 3.8 mmol/L (ref 3.5–5.1)
Sodium: 140 mmol/L (ref 135–145)

## 2017-10-31 LAB — MAGNESIUM: MAGNESIUM: 2 mg/dL (ref 1.7–2.4)

## 2017-10-31 LAB — HEPATITIS PANEL, ACUTE
HEP B S AG: NEGATIVE
Hep A IgM: NEGATIVE
Hep B C IgM: NEGATIVE

## 2017-10-31 MED ORDER — SODIUM CHLORIDE 0.9% FLUSH
10.0000 mL | INTRAVENOUS | Status: DC | PRN
  Administered 2017-11-02: 10 mL
  Filled 2017-10-31: qty 40

## 2017-10-31 NOTE — Consult Note (Addendum)
Cardiology Consultation:   Patient ID: Mason [name]; [id number]; 05-05-1965   Admit date: 10/29/2017 Date of Consult: 10/31/2017  Primary Care Provider: Patient, [name] Primary Cardiologist: [name], MD new  Primary Electrophysiologist:  NA   Patient Profile:   Mason [name] is a 52 y.o. 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 Quincy Simmonds..  History of Present Illness:   Mason [name] has no known past CAD, but dx with tonsillar cancer in 2015 and treated with chemo - cisplatin, then carbo/taxol and radiation, then recurrence and treated with 16 cycles of Keytruda, and in remission high risk for recurrence so continued chemo.     Pt presented to ER 10/30/17 with SOB and dizziness-- dyspnea for 4 weeks.  He had complained of chest pain last seconds as well. He had lower ext edema as well.  Other hs of pneumonitis treated with prednisone and COPD.  Today he tells me he had no chest pain.  The SOB occurred about 4 weeks ago and the edema last week.    Neg PE on CTA of chest + CHF and new lt apical 4 mm Pulmonary nodule.   Na 143, K+ 4.1, Cr 1.59 AST 46, ALT 70  WBC 8,2, Hgb 12.2, plt 222, BNP 1406   Troponin flat at 0.08 X 3 and 0.09 X 1 EKG I personally reviewed with SR and PACs, new T wave inversion in lateral leads.  Telemetry:  Telemetry was personally reviewed and demonstrates:  SR with bursts of 5 beats of NSVT.   Echo 10/30/17  EF 15% LV severely dilated, diffuse hypokinesis, restrictive physiology, Mild MR, LA mildly dilated, RV mildly dilated, systolic function was moderately reduced PA pk pressure 59 mmHg and trivial pericardial effusion  Today he feels much better after IV lasix 40 mg BID.  He has been started  on coreg and aldactone.  Edema has resolved.  He is able to eat and talk without SOB which he could not due on presentation.    Past Medical History:  Diagnosis Date  . Abnormal liver function test   . Acute sinusitis, unspecified 05/18/2015  . Anemia   . Anxiety    mild new dx  . Arthritis    knees,hips  . Bilateral edema of lower extremity   . Complication of anesthesia    Pt stated " my oxygen level was slow in rising."  . Concussion    Hx: in high school x 2  . Constipation   . Dysuria 09/04/2015  . Fever   . Hyperactive gag reflex   . Hypertension   . Hypoglycemia   . Hypokalemia   . Insomnia 06/15/2015  . Knee pain, chronic   . Malnutrition ([place])   . Non-healing surgical wound 05/23/2014  . PEG (percutaneous endoscopic gastrostomy) status ([place])   . Renal failure, acute ([place])   . S/P radiation therapy 08/19/2013-10/15/2013   Right Tonstil and bilateral neck / 70 Gy in 35 fractions to gross disease, 63 Gy in 35 fractions to high risk nodal echelons, and 56 Gy in 35 fractions to intermediate risk nodal echelons  . Severe nausea and vomiting   . Status post chemotherapy    Only received 2 doses  due to uncontrolled nausea and acute renal failure  . Tonsillar cancer ([place]) 07/09/13   SCCA of Right Tonsil, recurrent 2016    Past Surgical History:  Procedure Laterality Date  . LAPAROSCOPIC GASTROSTOMY N/A 08/15/2013   Procedure: LAPAROSCOPIC GASTROSTOMY TUBE PLACEMENT;  Surgeon: [name], MD;  Location: [place];  Service: General;  Laterality: N/A;  . LYMPH NODE BIOPSY  03/20/14   right neck  . MULTIPLE EXTRACTIONS WITH ALVEOLOPLASTY N/A 08/01/2013   Procedure: Extraction of tooth #'s 1,15,17,31, 32 with alveoloplasty, mandibular left torus reduction, and gross debridement of remaining teeth.;  Surgeon: [name], DDS;  Location: [facility];  Service: Oral Surgery;  Laterality: N/A;  . PORT-A-CATH REMOVAL N/A 05/13/2014   Procedure: REMOVAL of PORT-A-CATH;  Surgeon: [name], MD;  Location: [facility];  Service: General;  Laterality: N/A;  . PORTACATH PLACEMENT Left 08/15/2013   Procedure: INSERTION PORT-A-CATH;  Surgeon: [name], MD;  Location: [facility];  Service: General;  Laterality: Left;     Home Medications:  Prior to Admission medications   Medication Sig Start Date End Date Taking? Authorizing Provider  albuterol (PROVENTIL HFA;VENTOLIN HFA) 108 (90 Base) MCG/ACT inhaler Inhale 2 puffs into the lungs every 6 (six) hours as needed for wheezing or shortness of breath. 10/09/17  Yes Rigoberto Noel, MD  chlorhexidine (PERIDEX) 0.12 % solution Rinse with 15 mls twice daily for 30 seconds. Use after breakfast and at bedtime. Spit out excess. Do not swallow. 02/09/17  Yes [name], DDS  fluticasone (FLONASE) 50 MCG/ACT nasal spray Place 2 sprays into both nostrils daily. 09/06/16  Yes Gorsuch, Ni, MD  levothyroxine (SYNTHROID, LEVOTHROID) 25 MCG tablet TAKE 1 TABLET BY MOUTH DAILY BEFORE BREAKFAST. 05/08/17  Yes Gorsuch, Ni, MD  lidocaine-prilocaine (EMLA) cream Apply 1 application topically as needed. 06/15/17  Yes Gorsuch, Ni, MD  oxyCODONE (ROXICODONE) 15 MG immediate release tablet Take 1 tablet (15 mg total) by mouth every 8 (eight) hours as needed. 10/03/17  Yes [name], MD    Inpatient Medications: Scheduled Meds: . carvedilol  3.125 mg Oral BID WC  . enoxaparin (LOVENOX) injection  40 mg Subcutaneous Daily  . fluticasone  2 spray Each Nare Daily  . furosemide  40 mg Intravenous Q12H  . levothyroxine  25 mcg Oral QAC breakfast  . sodium chloride flush  3 mL Intravenous Q12H  . spironolactone  12.5 mg Oral Daily   Continuous Infusions: . sodium chloride     PRN Meds: sodium chloride, acetaminophen **OR** acetaminophen, albuterol, ondansetron (ZOFRAN) IV, oxyCODONE, sodium chloride flush, sodium chloride flush, zolpidem  Allergies:    Allergies  Allergen Reactions  . Pollen Extract Other (See Comments)    Watery eyes, runny nose,  sneezing    Social History:   Social History   Socioeconomic History  . Marital status: Single    Spouse name: Not on file  . Number of children: 1  . Years of education: Not on file  . Highest education level: Not on file  Occupational History  . Not on file  Social Needs  . Financial resource strain: Not on file  . Food insecurity:    Worry: Not on file    Inability: Not on file  . Transportation needs:    Medical: Not on file    Non-medical: Not on file  Tobacco Use  . Smoking status: Former Smoker    Packs/day: 1.00    Years: 20.00    Pack years: 20.00  Types: Cigarettes    Last attempt to quit: 07/16/2003    Years since quitting: 14.3  . Smokeless tobacco: Never Used  Substance and Sexual Activity  . Alcohol use: No    Comment: none years ago  . Drug use: Yes    Types: Marijuana, Benzodiazepines    Comment: years ago  . Sexual activity: Never  Lifestyle  . Physical activity:    Days per week: Not on file    Minutes per session: Not on file  . Stress: Not on file  Relationships  . Social connections:    Talks on phone: Not on file    Gets together: Not on file    Attends religious service: Not on file    Active member of club or organization: Not on file    Attends meetings of clubs or organizations: Not on file    Relationship status: Not on file  . Intimate partner violence:    Fear of current or ex partner: Not on file    Emotionally abused: Not on file    Physically abused: Not on file    Forced sexual activity: Not on file  Other Topics Concern  . Not on file  Social History Narrative  . Not on file    Family History:    Family History  Problem Relation Age of Onset  . Arthritis Mother   . Heart disease Mother   . Arthritis Father   . CVA Unknown   . Diabetes Unknown   . Heart attack Unknown   . Heart disease Maternal Grandmother   . CVA Maternal Grandmother      ROS:  Please see the history of present illness.  General:no colds  or fevers, + weight increase Skin:no rashes or ulcers HEENT:no blurred vision, no congestion CV:see HPI PUL:see HPI GI:no diarrhea constipation or melena, no indigestion GU:no hematuria, no dysuria MS:no joint pain, no claudication Neuro:no syncope, no lightheadedness Endo:no diabetes, no thyroid disease  All other ROS reviewed and negative.     Physical Exam/Data:   Vitals:   10/30/17 0406 10/30/17 1324 10/30/17 2052 10/31/17 0538  BP: (!) 146/116 (!) 122/101 (!) 126/93 (!) 126/103  Pulse: 74 98 94 97  Resp: (!) 22 18 17 19   Temp: 97.7 F (36.5 C) 98.1 F (36.7 C) 99.3 F (37.4 C) 98.3 F (36.8 C)  TempSrc: Oral Oral Oral Oral  SpO2: 99% 94% 93% 91%  Weight: 279 lb 6.4 oz (126.7 kg)   272 lb 7.8 oz (123.6 kg)  Height: 6\' 6"  (1.981 m)       Intake/Output Summary (Last 24 hours) at 10/31/2017 1035 Last data filed at 10/31/2017 0918 Gross per 24 hour  Intake 220 ml  Output 4225 ml  Net -4005 ml   Filed Weights   10/29/17 1930 10/30/17 0406 10/31/17 0538  Weight: 278 lb (126.1 kg) 279 lb 6.4 oz (126.7 kg) 272 lb 7.8 oz (123.6 kg)   Body mass index is 31.49 kg/m.  General:  Well nourished, well developed, in no acute distress- just back from abd ultra sound HEENT: Edentulous -otherwise normal Lymph: no adenopathy Neck: mild JVD Endocrine:  No thryomegaly Vascular: No carotid bruits; pedal pulses 2+ bilaterally  Cardiac:  normal S1, S2; RRR; no murmur, gallup, rub or click  Lungs:  clear to auscultation bilaterally, no wheezing, rhonchi or rales  Abd: soft, nontender, no hepatomegaly  Ext: no edema Musculoskeletal:  No deformities, BUE and BLE strength normal and equal Skin: warm and  dry  Neuro: Alert and oriented X 3 MAE follows commands, no focal abnormalities noted Psych:  Normal affect    Relevant CV Studies: Echo 10/30/17 Study Conclusions  - Left ventricle: The cavity size was severely dilated. Wall   thickness was normal. Systolic function was severely  reduced. The   estimated ejection fraction was 15%. Diffuse hypokinesis. Doppler   parameters are consistent with restrictive physiology, indicative   of decreased left ventricular diastolic compliance and/or   increased left atrial pressure. - Mitral valve: There was mild regurgitation. - Left atrium: The atrium was mildly dilated. - Right ventricle: The cavity size was mildly dilated. Systolic   function was moderately reduced. - Pulmonary arteries: Systolic pressure was moderately increased.   PA peak pressure: 59 mm Hg (S). - Pericardium, extracardiac: A trivial pericardial effusion was   identified.  Impressions:  - Definity used; severe global reduction in LV systolic function;   severe LVE; restrictive filling; mild MR; mild LAE; mild RVE with   moderate RV dysfunction; mild TR with moderate pulmonary   hypertension.   Laboratory Data:  Chemistry Recent Labs  Lab 10/29/17 1942 10/30/17 0548 10/31/17 0559  NA 143 142 140  K 4.1 4.2 3.8  CL 108 107 104  CO2 28 27 28   GLUCOSE 100* 97 90  BUN 20 18 20   CREATININE 1.59* 1.55* 1.63*  CALCIUM 8.5* 8.4* 7.7*  GFRNONAA 49* 51* 48*  GFRAA 57* 59* 55*  ANIONGAP 7 8 8     Recent Labs  Lab 10/29/17 1942 10/30/17 0548  PROT 5.6* 6.2*  ALBUMIN 3.3* 3.6  AST 46* 57*  ALT 70* 85*  ALKPHOS 47 49  BILITOT 1.0 1.9*   Hematology Recent Labs  Lab 10/29/17 1942 10/30/17 0548  WBC 8.2 10.7*  RBC 4.14* 4.39  HGB 12.2* 12.8*  HCT 38.4* 40.4  MCV 92.8 92.0  MCH 29.5 29.2  MCHC 31.8 31.7  RDW 15.9* 15.7*  PLT 222 249   Cardiac Enzymes Recent Labs  Lab 10/30/17 0515 10/30/17 1232 10/30/17 1937  TROPONINI 0.08* 0.08* 0.09*    Recent Labs  Lab 10/30/17 0018  TROPIPOC 0.08    BNP Recent Labs  Lab 10/29/17 1942  BNP 1,406.6*    DDimer No results for input(s): DDIMER in the last 168 hours.  Radiology/Studies:  Dg Chest 2 View  Result Date: 10/29/2017 CLINICAL DATA:  Chest pain and shortness of breath  with head neck cancer. EXAM: CHEST - 2 VIEW COMPARISON:  09/15/2017 CT.  07/13/2017 plain film. FINDINGS: Moderate lower thoracic spondylosis. Right Port-A-Cath which terminates at the low SVC. Midline trachea. Borderline cardiomegaly. Mediastinal contours otherwise within normal limits. No pleural effusion or pneumothorax. Diffuse peribronchial thickening. No lobar consolidation. IMPRESSION: No acute cardiopulmonary disease. Peribronchial thickening which may relate to chronic bronchitis or smoking. Electronically Signed   By: Abigail Miyamoto M.D.   On: 10/29/2017 20:23   Ct Angio Chest Pe W/cm &/or Wo Cm  Result Date: 10/29/2017 CLINICAL DATA:  Shortness of breath and leg swelling for several days. Dizziness. EXAM: CT ANGIOGRAPHY CHEST WITH CONTRAST TECHNIQUE: Multidetector CT imaging of the chest was performed using the standard protocol during bolus administration of intravenous contrast. Multiplanar CT image reconstructions and MIPs were obtained to evaluate the vascular anatomy. CONTRAST:  17mL ISOVUE-370 IOPAMIDOL (ISOVUE-370) INJECTION 76% COMPARISON:  Plain films of earlier today.  CT 09/15/2017. FINDINGS: Cardiovascular: The quality of this exam for evaluation of pulmonary embolism is moderate. The bolus is well timed. The primary  limitation is motion. No evidence of pulmonary embolism. A right-sided Port-A-Cath terminates at the superior caval/atrial junction. Mild cardiomegaly, without pericardial effusion. The aorta is normal in caliber but not well opacified secondary to bolus timing. Mediastinum/Nodes: Precarinal node measures 1.3 cm and is newly enlarged. A node within the azygoesophageal recess is newly enlarged at 1.9 cm on 51/5. Right hilar adenopathy at 1.9 cm. Prevascular node of 10 mm is enlarged from 7 mm on the prior. Lungs/Pleura: Small pleural effusions, larger on the right. Mild to moderate motion degradation, most significant inferiorly. Mild centrilobular emphysema with superimposed  mild interstitial edema, as evidenced by areas of heterogeneous ground-glass opacity and smooth septal thickening. A vague left apical 4 mm nodule on 45/11 is new since the prior, favored to represent a subpleural lymph node. Upper Abdomen: Contrast reflux into the hepatic veins and IVC, suggesting elevated right heart pressures. Motion degradation continuing into the upper abdomen. Normal imaged portions of the liver, spleen, stomach, pancreas, gallbladder, biliary tract, adrenal glands, kidneys. Musculoskeletal: Moderate thoracic spondylosis. Review of the MIP images confirms the above findings. IMPRESSION: 1. Moderate quality exam for pulmonary embolism with primary limitation of motion. No embolism identified. 2. Findings of congestive heart failure, including bilateral pleural effusions and septal thickening. 3. New thoracic adenopathy since approximately 6 weeks ago. Favor secondary to fluid overload/congestive heart failure. 4. New left apical 4 mm pulmonary nodule. Favored to represent a subpleural lymph node. Non-contrast chest CT can be considered in 12 months, given risk factors for primary bronchogenic carcinoma. This recommendation follows the consensus statement: Guidelines for Management of Incidental Pulmonary Nodules Detected on CT Images: From the Fleischner Society 2017; Radiology 2017; 284:228-243. Electronically Signed   By: Abigail Miyamoto M.D.   On: 10/29/2017 22:31    Assessment and Plan:   1. Acute systolic HF much improved with diuresis, is negative 7860 since admit and wt down from 279 to 272 lbs.  Cr has elevated to 1.63 from 1.59 on admit which is his baseline.  Dr. Harrington Challenger to see.  2. Cardiomyopathy with EF 15%  On BB coreg 3.125 BID and aldactone --new would try ARB after diuresis but monitor renal function.    3. Tonsillar cancer with now 3rd round of chemo.  Oncology has seen 4. NSVT want K+ 4.0 and will check Mg+  5. CKD 3 monitor  6.         Hypothyroidism per IM   For  questions or updates, please contact [facility] Please consult [email or web address] for contact info under Cardiology/STEMI.   Signed, Cecilie Kicks, NP  10/31/2017 10:35 AM'  Pt seen and examined   I agree with findings as noted above   Pt with no known cardiac history   Says taht over the past 1 1/2 months has had increased dyspnea with exertion and then edema Admitted yesterday with SOB and dizzienss      Echo since admit shows severe LVE and LVEF severely depressed at 15%  RVEF  Moderately depressed      Etiology pf CHF:  Probably not related to chemotherapy he has received (reviewed with pharmacy)    CT scan of chest without signif CAD demonostrated     Pt denies CP     ? Viral    ? Idiopathic   Pt's TSH is minimally elevated, does not explain    The pt has diuresed 7.6 L so far On exam:   Neck:   JVP normal   Lungs with mild wheezes,  rales, Cardiac exam:   RRR   No murmurs   No S3   Abd with minimal RUQ tenderness   Ext with trivial edema   The pt still has some evid of volume overload  I would recomm continuing IV diuresis    He has been started on coreg and aldactone.   WOuld follow BP and Cr   Add Entresto or ARB as BP allows with close follow up of renal function   I will review CT with radiology but would defer ischemic work up now.  Pt will need repeat echo in several months to reassess LVEF    Discussed fluid and salt restriction with pt    Alos recomm daily AM wts at home.  Will see in AM  [name]

## 2017-10-31 NOTE — Progress Notes (Signed)
Mason [name]   DOB:July 17, 1965   ER#:[id number]    Assessment & Plan:   Tongue cancer His outpatient chemotherapy has been rescheduled till next week Continue supportive care There is no contraindication for him to receive Keytruda in the outpatient. Beryle Flock is not the cause of significant cardiomyopathy  Asymptomatic lung nodules and mild lymphadenopathy We will monitor with repeat CT imaging in 3 months in the outpatient setting  Fluid overload Congestive heart failure with diffuse hypokinesis noted on recent echocardiogram Further investigation in progress.  He is diuresing well.  Would defer to primary service for further management  Chronic kidney disease stage III He has baseline chronic kidney disease stage III due to prior chemotherapy side effects We will monitor serum creatinine carefully.  Elevated TSH Likely due to his recent immunotherapy and prior radiation exposure He will continue levothyroxine  Elevated total bilirubin Ultrasound is negative  CODE STATUS Full code  Discharge planning Will defer to primary service   [name], MD 10/31/2017  2:09 PM   Subjective:  He continues to have symptomatic improvement since admission.  He had good result from aggressive diuresis.  He denies chest pain or cough.  Awaiting final cardiology recommendation  Objective:  Vitals:   10/31/17 0538 10/31/17 1314  BP: (!) 126/103 102/81  Pulse: 97 90  Resp: 19 20  Temp: 98.3 F (36.8 C) 98.1 F (36.7 C)  SpO2: 91% 93%     Intake/Output Summary (Last 24 hours) at 10/31/2017 1409 Last data filed at 10/31/2017 1315 Gross per 24 hour  Intake 440 ml  Output 4225 ml  Net -3785 ml    GENERAL:alert, no distress and comfortable NEURO: alert & oriented x 3 with fluent speech, no focal motor/sensory deficits   Labs:  Lab Results  Component Value Date   WBC 10.7 (H) 10/30/2017   HGB 12.8 (L) 10/30/2017   HCT 40.4 10/30/2017   MCV 92.0 10/30/2017   PLT  249 10/30/2017   NEUTROABS 7.1 10/29/2017    Lab Results  Component Value Date   NA 140 10/31/2017   K 3.8 10/31/2017   CL 104 10/31/2017   CO2 28 10/31/2017    Studies:  Dg Chest 2 View  Result Date: 10/29/2017 CLINICAL DATA:  Chest pain and shortness of breath with head neck cancer. EXAM: CHEST - 2 VIEW COMPARISON:  09/15/2017 CT.  07/13/2017 plain film. FINDINGS: Moderate lower thoracic spondylosis. Right Port-A-Cath which terminates at the low SVC. Midline trachea. Borderline cardiomegaly. Mediastinal contours otherwise within normal limits. No pleural effusion or pneumothorax. Diffuse peribronchial thickening. No lobar consolidation. IMPRESSION: No acute cardiopulmonary disease. Peribronchial thickening which may relate to chronic bronchitis or smoking. Electronically Signed   By: Abigail Miyamoto M.D.   On: 10/29/2017 20:23   Ct Angio Chest Pe W/cm &/or Wo Cm  Result Date: 10/29/2017 CLINICAL DATA:  Shortness of breath and leg swelling for several days. Dizziness. EXAM: CT ANGIOGRAPHY CHEST WITH CONTRAST TECHNIQUE: Multidetector CT imaging of the chest was performed using the standard protocol during bolus administration of intravenous contrast. Multiplanar CT image reconstructions and MIPs were obtained to evaluate the vascular anatomy. CONTRAST:  63mL ISOVUE-370 IOPAMIDOL (ISOVUE-370) INJECTION 76% COMPARISON:  Plain films of earlier today.  CT 09/15/2017. FINDINGS: Cardiovascular: The quality of this exam for evaluation of pulmonary embolism is moderate. The bolus is well timed. The primary limitation is motion. No evidence of pulmonary embolism. A right-sided Port-A-Cath terminates at the superior caval/atrial junction. Mild cardiomegaly, without pericardial effusion. The  aorta is normal in caliber but not well opacified secondary to bolus timing. Mediastinum/Nodes: Precarinal node measures 1.3 cm and is newly enlarged. A node within the azygoesophageal recess is newly enlarged at 1.9 cm on  51/5. Right hilar adenopathy at 1.9 cm. Prevascular node of 10 mm is enlarged from 7 mm on the prior. Lungs/Pleura: Small pleural effusions, larger on the right. Mild to moderate motion degradation, most significant inferiorly. Mild centrilobular emphysema with superimposed mild interstitial edema, as evidenced by areas of heterogeneous ground-glass opacity and smooth septal thickening. A vague left apical 4 mm nodule on 45/11 is new since the prior, favored to represent a subpleural lymph node. Upper Abdomen: Contrast reflux into the hepatic veins and IVC, suggesting elevated right heart pressures. Motion degradation continuing into the upper abdomen. Normal imaged portions of the liver, spleen, stomach, pancreas, gallbladder, biliary tract, adrenal glands, kidneys. Musculoskeletal: Moderate thoracic spondylosis. Review of the MIP images confirms the above findings. IMPRESSION: 1. Moderate quality exam for pulmonary embolism with primary limitation of motion. No embolism identified. 2. Findings of congestive heart failure, including bilateral pleural effusions and septal thickening. 3. New thoracic adenopathy since approximately 6 weeks ago. Favor secondary to fluid overload/congestive heart failure. 4. New left apical 4 mm pulmonary nodule. Favored to represent a subpleural lymph node. Non-contrast chest CT can be considered in 12 months, given risk factors for primary bronchogenic carcinoma. This recommendation follows the consensus statement: Guidelines for Management of Incidental Pulmonary Nodules Detected on CT Images: From the Fleischner Society 2017; Radiology 2017; 284:228-243. Electronically Signed   By: Abigail Miyamoto M.D.   On: 10/29/2017 22:31   US Abdomen Complete  Result Date: 10/31/2017 CLINICAL DATA:  Elevated liver function tests. History of tonsillar carcinoma. EXAM: ABDOMEN ULTRASOUND COMPLETE COMPARISON:  CT abdomen and pelvis 07/23/2015. FINDINGS: Gallbladder: No gallstones or wall  thickening visualized. No sonographic Murphy sign noted by sonographer. Common bile duct: Diameter: 0.5 cm Liver: No focal lesion identified. Within normal limits in parenchymal echogenicity. Portal vein is patent on color Doppler imaging with normal direction of blood flow towards the liver. IVC: No abnormality visualized. Pancreas: Visualized portion unremarkable. Spleen: Size and appearance within normal limits. Right Kidney: Length: 10.8 cm. Echogenicity within normal limits. No mass or hydronephrosis visualized. Left Kidney: Length: 11.2 cm. Echogenicity within normal limits. No mass or hydronephrosis visualized. Abdominal aorta: No aneurysm visualized. Other findings: Small right pleural effusion is noted. IMPRESSION: Small right pleural effusion. The examination is otherwise negative. Electronically Signed   By: Inge Rise M.D.   On: 10/31/2017 10:38

## 2017-10-31 NOTE — Progress Notes (Signed)
PROGRESS NOTE [facility]  FOUNTAIN DERUSHA   ZMO:[id number] DOB: July 05, 1965  DOA: 10/29/2017 PCP: Patient, [name]   Brief Narrative:  Mr. Godman is a 52 y.o. 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 Alvy Bimler. He presented to the emergency room with a 4 week history of worsening dyspnea, which got significantly worse over the weekend. He also endorsed weight gain, lower extremity edema, orthopnea and substernal CP episodes that were provoked by coughing. He was treated recently with prednisone for pneumonitis. He has not had treatment for his tonsillar cancer in 2 months but is scheduled for a Keytruda infusion. CTA showed bilateral pleural effusions and septal thickening, new thoracic adenopathy, and a new 4 mm pulmonary nodule. BNP 1406.6. Patient received 40 mg IV lasix in the emergency room. He was admitted on telemetry with the working diagnosis of dyspnea secondary to CHF.  Subjective: Patient is feeling quite well today with no new complaints. He reports that his LE edema is much improved and that he believes they are back at his baseline. He has only had one episode of dyspnea during the abdominal US. No additional cough, orthopnea, PND, CP, fevers/chills. He does have some mild epigastric abdominal pain but he attributes this to being NPO. No NVD.   Assessment & Plan: Dyspnea secondary to CHF  - Echo shows EF of 15% with diffuse hypokinesis, diastolic dysfunction, and pulmonary hypertension. Etiology likely chronic untreated HTN - he also has a significant past history of substance abuse with cocaine.  - Elevated troponin from demand ischemia.  - Started on Carvedilol 3.125mg  and low dose spironolactone. - He is responding well to diuresis. Net output of 3.3 L yesterday. Will continue 40 mg IV Lasix BID.  - Cardiology consulted and is following with the patient. - Daily  weights, low salt intake, fluid intake restrictions   Chronic Kidney Disease III - sCr today is 1.63, which appears to be his baseline renal function  - Monitor with daily CMP. Encouraged good oral hydration.  Hypothyroidism - TSH elevated at 5.868. Free T4 normal at 1.03.  - Continue with Synthroid 25 mcg daily.   Hypertension - Was not on any anti-htn medications prior to admission. He was started on Carvedilol and Spironolactone.  - BP much improved. Continue to closely monitor.  Abnormal LFTs - Hepatitis panel negative. Abdominal US shows only small right pleural effusion. Possibly reflex congestion vs immunotherapy vs radiation exposure.   Tonsillar Carcinoma  - Following with primary oncologist, Dr. Alvy Bimler.  DVT prophylaxis: Subcutaneous Lovenox Code Status: FULL Family Communication: None at bedside Disposition Plan: Home 1-2 days  Consultants:   Cardiology   Procedures:   Echocardiogram   Antimicrobials:  None   Objective: Vitals:   10/30/17 0406 10/30/17 1324 10/30/17 2052 10/31/17 0538  BP: (!) 146/116 (!) 122/101 (!) 126/93 (!) 126/103  Pulse: 74 98 94 97  Resp: (!) 22 18 17 19   Temp: 97.7 F (36.5 C) 98.1 F (36.7 C) 99.3 F (37.4 C) 98.3 F (36.8 C)  TempSrc: Oral Oral Oral Oral  SpO2: 99% 94% 93% 91%  Weight: 126.7 kg (279 lb 6.4 oz)   123.6 kg (272 lb 7.8 oz)  Height: 6\' 6"  (1.981 m)       Intake/Output Summary (Last 24 hours) at 10/31/2017 1044 Last data filed at 10/31/2017 0918 Gross per 24 hour  Intake 220 ml  Output 4225 ml  Net -4005 ml   Filed Weights   10/29/17 1930 10/30/17 0406 10/31/17 0538  Weight: 126.1 kg (278 lb) 126.7 kg (279 lb 6.4 oz) 123.6 kg (272 lb 7.8 oz)   Examination:  General exam: Patient is awake and alert, in NAD. Appears calm and comfortable  Respiratory system: Good air entry. CTAB, no residual crackles appreciated. Cardiovascular system: S1 & S2 heard, RRR. No JVD, murmurs, rubs or  gallops Gastrointestinal system: Abdomen is nondistended, soft and nontender. No organomegaly or masses felt. Normal bowel sounds heard. Central nervous system: Alert and oriented. No focal neurological deficits. Extremities: Trace edema bilateral lower extremities. No cyanosis. Distal pulses intact.  Skin: No rashes, lesions or ulcers Psychiatry: Judgement and insight appear normal. Mood & affect appropriate.   Data Reviewed: I have personally reviewed following labs and imaging studies  CBC: Recent Labs  Lab 10/29/17 1942 10/30/17 0548  WBC 8.2 10.7*  NEUTROABS 7.1  --   HGB 12.2* 12.8*  HCT 38.4* 40.4  MCV 92.8 92.0  PLT 222 989   Basic Metabolic Panel: Recent Labs  Lab 10/29/17 1942 10/30/17 0548 10/31/17 0559  NA 143 142 140  K 4.1 4.2 3.8  CL 108 107 104  CO2 28 27 28   GLUCOSE 100* 97 90  BUN 20 18 20   CREATININE 1.59* 1.55* 1.63*  CALCIUM 8.5* 8.4* 7.7*   GFR: Estimated Creatinine Clearance: 80 mL/min (A) (by C-G formula based on SCr of 1.63 mg/dL (H)). Liver Function Tests: Recent Labs  Lab 10/29/17 1942 10/30/17 0548  AST 46* 57*  ALT 70* 85*  ALKPHOS 47 49  BILITOT 1.0 1.9*  PROT 5.6* 6.2*  ALBUMIN 3.3* 3.6   Cardiac Enzymes: Recent Labs  Lab 10/30/17 0515 10/30/17 1232 10/30/17 1937  TROPONINI 0.08* 0.08* 0.09*   Thyroid Function Tests: Recent Labs    10/30/17 0515  TSH 5.868*  FREET4 1.03   Sepsis Labs: Recent Labs  Lab 10/29/17 1950 10/29/17 2315  LATICACIDVEN 0.97 0.85   Radiology Studies: Dg Chest 2 View  Result Date: 10/29/2017 CLINICAL DATA:  Chest pain and shortness of breath with head neck cancer. EXAM: CHEST - 2 VIEW COMPARISON:  09/15/2017 CT.  07/13/2017 plain film. FINDINGS: Moderate lower thoracic spondylosis. Right Port-A-Cath which terminates at the low SVC. Midline trachea. Borderline cardiomegaly. Mediastinal contours otherwise within normal limits. No pleural effusion or pneumothorax. Diffuse peribronchial  thickening. No lobar consolidation. IMPRESSION: No acute cardiopulmonary disease. Peribronchial thickening which may relate to chronic bronchitis or smoking. Electronically Signed   By: Abigail Miyamoto M.D.   On: 10/29/2017 20:23   Ct Angio Chest Pe W/cm &/or Wo Cm  Result Date: 10/29/2017 CLINICAL DATA:  Shortness of breath and leg swelling for several days. Dizziness. EXAM: CT ANGIOGRAPHY CHEST WITH CONTRAST TECHNIQUE: Multidetector CT imaging of the chest was performed using the standard protocol during bolus administration of intravenous contrast. Multiplanar CT image reconstructions and MIPs were obtained to evaluate the vascular anatomy. CONTRAST:  51mL ISOVUE-370 IOPAMIDOL (ISOVUE-370) INJECTION 76% COMPARISON:  Plain films of earlier today.  CT 09/15/2017. FINDINGS: Cardiovascular: The quality of this exam for evaluation of pulmonary embolism is moderate. The bolus is well timed. The primary limitation is motion. No evidence of pulmonary embolism. A right-sided Port-A-Cath terminates at the superior caval/atrial junction. Mild cardiomegaly, without pericardial effusion. The aorta is normal in caliber but not well opacified secondary to bolus timing. Mediastinum/Nodes: Precarinal node measures 1.3 cm and is newly enlarged. A node within the azygoesophageal recess is  newly enlarged at 1.9 cm on 51/5. Right hilar adenopathy at 1.9 cm. Prevascular node of 10 mm is enlarged from 7 mm on the prior. Lungs/Pleura: Small pleural effusions, larger on the right. Mild to moderate motion degradation, most significant inferiorly. Mild centrilobular emphysema with superimposed mild interstitial edema, as evidenced by areas of heterogeneous ground-glass opacity and smooth septal thickening. A vague left apical 4 mm nodule on 45/11 is new since the prior, favored to represent a subpleural lymph node. Upper Abdomen: Contrast reflux into the hepatic veins and IVC, suggesting elevated right heart pressures. Motion degradation  continuing into the upper abdomen. Normal imaged portions of the liver, spleen, stomach, pancreas, gallbladder, biliary tract, adrenal glands, kidneys. Musculoskeletal: Moderate thoracic spondylosis. Review of the MIP images confirms the above findings. IMPRESSION: 1. Moderate quality exam for pulmonary embolism with primary limitation of motion. No embolism identified. 2. Findings of congestive heart failure, including bilateral pleural effusions and septal thickening. 3. New thoracic adenopathy since approximately 6 weeks ago. Favor secondary to fluid overload/congestive heart failure. 4. New left apical 4 mm pulmonary nodule. Favored to represent a subpleural lymph node. Non-contrast chest CT can be considered in 12 months, given risk factors for primary bronchogenic carcinoma. This recommendation follows the consensus statement: Guidelines for Management of Incidental Pulmonary Nodules Detected on CT Images: From the Fleischner Society 2017; Radiology 2017; 284:228-243. Electronically Signed   By: Abigail Miyamoto M.D.   On: 10/29/2017 22:31   US Abdomen Complete  Result Date: 10/31/2017 CLINICAL DATA:  Elevated liver function tests. History of tonsillar carcinoma. EXAM: ABDOMEN ULTRASOUND COMPLETE COMPARISON:  CT abdomen and pelvis 07/23/2015. FINDINGS: Gallbladder: No gallstones or wall thickening visualized. No sonographic Murphy sign noted by sonographer. Common bile duct: Diameter: 0.5 cm Liver: No focal lesion identified. Within normal limits in parenchymal echogenicity. Portal vein is patent on color Doppler imaging with normal direction of blood flow towards the liver. IVC: No abnormality visualized. Pancreas: Visualized portion unremarkable. Spleen: Size and appearance within normal limits. Right Kidney: Length: 10.8 cm. Echogenicity within normal limits. No mass or hydronephrosis visualized. Left Kidney: Length: 11.2 cm. Echogenicity within normal limits. No mass or hydronephrosis visualized. Abdominal  aorta: No aneurysm visualized. Other findings: Small right pleural effusion is noted. IMPRESSION: Small right pleural effusion. The examination is otherwise negative. Electronically Signed   By: Inge Rise M.D.   On: 10/31/2017 10:38   Scheduled Meds: . carvedilol  3.125 mg Oral BID WC  . enoxaparin (LOVENOX) injection  40 mg Subcutaneous Daily  . fluticasone  2 spray Each Nare Daily  . furosemide  40 mg Intravenous Q12H  . levothyroxine  25 mcg Oral QAC breakfast  . sodium chloride flush  3 mL Intravenous Q12H  . spironolactone  12.5 mg Oral Daily   Continuous Infusions: . sodium chloride      LOS: 1 day   Time spent: Total of 20 minutes spent with patient.  Longino Trefz, PA-S  If 7PM-7AM, please contact night-coverage [email or web address] 10/31/2017, 10:44 AM

## 2017-10-31 NOTE — Progress Notes (Signed)
Patient has been NPO since midnight in anticipation of the Abdominal Ultrasound.

## 2017-10-31 NOTE — Progress Notes (Signed)
Pt returned from [place].  Diet order restarted. Pt resting in bed.

## 2017-11-01 ENCOUNTER — Telehealth: Payer: Self-pay | Admitting: Internal Medicine

## 2017-11-01 ENCOUNTER — Other Ambulatory Visit: Payer: Self-pay | Admitting: Physician Assistant

## 2017-11-01 DIAGNOSIS — I5031 Acute diastolic (congestive) heart failure: Secondary | ICD-10-CM

## 2017-11-01 DIAGNOSIS — N289 Disorder of kidney and ureter, unspecified: Secondary | ICD-10-CM

## 2017-11-01 DIAGNOSIS — N179 Acute kidney failure, unspecified: Secondary | ICD-10-CM

## 2017-11-01 DIAGNOSIS — N189 Chronic kidney disease, unspecified: Principal | ICD-10-CM

## 2017-11-01 LAB — COMPREHENSIVE METABOLIC PANEL
ALK PHOS: 53 U/L (ref 38–126)
ALT: 63 U/L — AB (ref 0–44)
AST: 36 U/L (ref 15–41)
Albumin: 3.3 g/dL — ABNORMAL LOW (ref 3.5–5.0)
Anion gap: 9 (ref 5–15)
BUN: 26 mg/dL — AB (ref 6–20)
CO2: 31 mmol/L (ref 22–32)
CREATININE: 1.84 mg/dL — AB (ref 0.61–1.24)
Calcium: 8.4 mg/dL — ABNORMAL LOW (ref 8.9–10.3)
Chloride: 98 mmol/L (ref 98–111)
GFR calc Af Amer: 48 mL/min — ABNORMAL LOW (ref >60)
GFR calc non Af Amer: 41 mL/min — ABNORMAL LOW (ref >60)
GLUCOSE: 104 mg/dL — AB (ref 70–99)
Potassium: 4.4 mmol/L (ref 3.5–5.1)
SODIUM: 138 mmol/L (ref 135–145)
TOTAL PROTEIN: 6 g/dL — AB (ref 6.5–8.1)
Total Bilirubin: 1 mg/dL (ref 0.3–1.2)

## 2017-11-01 LAB — CBC WITH DIFFERENTIAL/PLATELET
BASOS ABS: 0 10*3/uL (ref 0.0–0.1)
Basophils Relative: 0 %
EOS ABS: 0.1 10*3/uL (ref 0.0–0.7)
Eosinophils Relative: 1 %
HCT: 41.3 % (ref 39.0–52.0)
HEMOGLOBIN: 13.2 g/dL (ref 13.0–17.0)
LYMPHS ABS: 0.7 10*3/uL (ref 0.7–4.0)
LYMPHS PCT: 8 %
MCH: 29.3 pg (ref 26.0–34.0)
MCHC: 32 g/dL (ref 30.0–36.0)
MCV: 91.8 fL (ref 78.0–100.0)
Monocytes Absolute: 0.4 10*3/uL (ref 0.1–1.0)
Monocytes Relative: 5 %
NEUTROS PCT: 86 %
Neutro Abs: 6.8 10*3/uL (ref 1.7–7.7)
PLATELETS: 245 10*3/uL (ref 150–400)
RBC: 4.5 MIL/uL (ref 4.22–5.81)
RDW: 15.5 % (ref 11.5–15.5)
WBC: 7.9 10*3/uL (ref 4.0–10.5)

## 2017-11-01 LAB — BRAIN NATRIURETIC PEPTIDE: B NATRIURETIC PEPTIDE 5: 1018.8 pg/mL — AB (ref 0.0–100.0)

## 2017-11-01 LAB — MAGNESIUM: MAGNESIUM: 2.1 mg/dL (ref 1.7–2.4)

## 2017-11-01 MED ORDER — FUROSEMIDE 10 MG/ML IJ SOLN: 40.0000 mg | Freq: Two times a day (BID) | INTRAMUSCULAR | Status: DC

## 2017-11-01 MED ORDER — FUROSEMIDE 10 MG/ML IJ SOLN: 60.0000 mg | Freq: Two times a day (BID) | INTRAMUSCULAR | Status: DC

## 2017-11-01 MED ORDER — HYDRALAZINE HCL 25 MG PO TABS
25.0000 mg | ORAL_TABLET | Freq: Three times a day (TID) | ORAL | Status: DC
  Administered 2017-11-01 – 2017-11-02 (×3): 25 mg via ORAL
  Filled 2017-11-01 (×3): qty 1

## 2017-11-01 MED ORDER — ISOSORBIDE MONONITRATE ER 30 MG PO TB24
15.0000 mg | ORAL_TABLET | Freq: Every day | ORAL | Status: DC
  Administered 2017-11-01 – 2017-11-02 (×2): 15 mg via ORAL
  Filled 2017-11-01 (×2): qty 1

## 2017-11-01 NOTE — Telephone Encounter (Signed)
Patient is currently admitted

## 2017-11-01 NOTE — Progress Notes (Signed)
Progress Note  Patient Name: Mason [name] Date of Encounter: 11/01/2017  Primary Cardiologist: [name], MD   Subjective   Jasmine December is better   Still bringing up pale sputum   No CP   Never had   Inpatient Medications    Scheduled Meds: . carvedilol  3.125 mg Oral BID WC  . enoxaparin (LOVENOX) injection  40 mg Subcutaneous Daily  . fluticasone  2 spray Each Nare Daily  . furosemide  40 mg Intravenous Q12H  . levothyroxine  25 mcg Oral QAC breakfast  . sodium chloride flush  3 mL Intravenous Q12H  . spironolactone  12.5 mg Oral Daily   Continuous Infusions: . sodium chloride     PRN Meds:  Vital Signs    Vitals:   11/01/17 0456 11/01/17 0500 11/01/17 0818 11/01/17 1247  BP: (!) 110/97  (!) 119/92 106/84  Pulse: 89  85 85  Resp: 18   (!) 24  Temp: 98.8 F (37.1 C)   98.4 F (36.9 C)  TempSrc: Oral   Oral  SpO2: 98%  99% 98%  Weight:  118.6 kg (261 lb 7.5 oz)    Height:        Intake/Output Summary (Last 24 hours) at 11/01/2017 1316 Last data filed at 11/01/2017 1250 Gross per 24 hour  Intake 660 ml  Output 3125 ml  Net -2465 ml   Filed Weights   10/30/17 0406 10/31/17 0538 11/01/17 0500  Weight: 126.7 kg (279 lb 6.4 oz) 123.6 kg (272 lb 7.8 oz) 118.6 kg (261 lb 7.5 oz)    Telemetry    SR  - Personally Reviewed  ECG     Physical Exam   GEN: No acute distress.   Neck: JVP minimally increased   Cardiac: RRR, no murmurs, rubs, or gallops.  Respiratory:Mild wheeze and rhonchi bilateral base GI: Soft, nontender, non-distended  MS: Tr edema; No deformity. Neuro:  Nonfocal  Psych: Normal affect   Labs    Chemistry Recent Labs  Lab 10/29/17 1942 10/30/17 0548 10/31/17 0559 11/01/17 0420  NA 143 142 140 138  K 4.1 4.2 3.8 4.4  CL 108 107 104 98  CO2 28 27 28 31   GLUCOSE 100* 97 90 104*  BUN 20 18 20  26*  CREATININE 1.59* 1.55* 1.63* 1.84*  CALCIUM 8.5* 8.4* 7.7* 8.4*  PROT 5.6* 6.2*  --  6.0*  ALBUMIN 3.3* 3.6  --  3.3*  AST 46*  57*  --  36  ALT 70* 85*  --  63*  ALKPHOS 47 49  --  53  BILITOT 1.0 1.9*  --  1.0  GFRNONAA 49* 51* 48* 41*  GFRAA 57* 59* 55* 48*  ANIONGAP 7 8 8 9      Hematology Recent Labs  Lab 10/29/17 1942 10/30/17 0548 11/01/17 0420  WBC 8.2 10.7* 7.9  RBC 4.14* 4.39 4.50  HGB 12.2* 12.8* 13.2  HCT 38.4* 40.4 41.3  MCV 92.8 92.0 91.8  MCH 29.5 29.2 29.3  MCHC 31.8 31.7 32.0  RDW 15.9* 15.7* 15.5  PLT 222 249 245    Cardiac Enzymes Recent Labs  Lab 10/30/17 0515 10/30/17 1232 10/30/17 1937  TROPONINI 0.08* 0.08* 0.09*    Recent Labs  Lab 10/30/17 0018  TROPIPOC 0.08     BNP Recent Labs  Lab 10/29/17 1942 11/01/17 0420  BNP 1,406.6* 1,018.8*     DDimer No results for input(s): DDIMER in the last 168 hours.   Radiology    US  Abdomen Complete  Result Date: 10/31/2017 CLINICAL DATA:  Elevated liver function tests. History of tonsillar carcinoma. EXAM: ABDOMEN ULTRASOUND COMPLETE COMPARISON:  CT abdomen and pelvis 07/23/2015. FINDINGS: Gallbladder: No gallstones or wall thickening visualized. No sonographic Murphy sign noted by sonographer. Common bile duct: Diameter: 0.5 cm Liver: No focal lesion identified. Within normal limits in parenchymal echogenicity. Portal vein is patent on color Doppler imaging with normal direction of blood flow towards the liver. IVC: No abnormality visualized. Pancreas: Visualized portion unremarkable. Spleen: Size and appearance within normal limits. Right Kidney: Length: 10.8 cm. Echogenicity within normal limits. No mass or hydronephrosis visualized. Left Kidney: Length: 11.2 cm. Echogenicity within normal limits. No mass or hydronephrosis visualized. Abdominal aorta: No aneurysm visualized. Other findings: Small right pleural effusion is noted. IMPRESSION: Small right pleural effusion. The examination is otherwise negative. Electronically Signed   By: Inge Rise M.D.   On: 10/31/2017 10:38    Cardiac Studies   Echo Study  Conclusions  - Left ventricle: The cavity size was severely dilated. Wall   thickness was normal. Systolic function was severely reduced. The   estimated ejection fraction was 15%. Diffuse hypokinesis. Doppler   parameters are consistent with restrictive physiology, indicative   of decreased left ventricular diastolic compliance and/or   increased left atrial pressure. - Mitral valve: There was mild regurgitation. - Left atrium: The atrium was mildly dilated. - Right ventricle: The cavity size was mildly dilated. Systolic   function was moderately reduced. - Pulmonary arteries: Systolic pressure was moderately increased.   PA peak pressure: 59 mm Hg (S). - Pericardium, extracardiac: A trivial pericardial effusion was   identified.   Assessment & Plan    1  Acute systolic CHF   Volume is improved from admit   [place] with mild increase  BUt, Cr has bumped   I would pull back on diuretic  Will need BNP and BMET Fri and f/u next week  Would keep on Coreg  Stop aldactone for now  Add hydralazine 25 q 8 hours   Imdur 15  Follow BP this PM    Will get early f/u next week  In clnic     Daily wts     Hold on any ischemic eval for now.   Low likely given CT with no signif calcifications of coronary arteries  Follow as outpt   Will need f/u echo  [name]     For questions or updates, please contact [place] Please consult [email or web address] for contact info under Cardiology/STEMI.      Signed, [name], MD  11/01/2017, 1:16 PM

## 2017-11-01 NOTE — Progress Notes (Signed)
PROGRESS NOTE [facility]  Mason [name]   YPP:[id number] DOB: 1965-10-27  DOA: 10/29/2017 PCP: Patient, [name]   Brief Narrative:  Mason [name] is a 52 y.o. 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 Alvy Bimler. He presented to the emergency room with a 4 week history of worsening dyspnea, which got significantly worse over the weekend. He also endorsed weight gain, lower extremity edema, orthopnea and substernal CP episodes that were provoked by coughing. He was treated recently with prednisone for pneumonitis. He has not had treatment for his tonsillar cancer in 2 months but is scheduled for a Keytruda infusion. CTA showed bilateral pleural effusions and septal thickening, new thoracic adenopathy, and a new 4 mm pulmonary nodule. BNP 1406.6. Patient received 40 mg IV lasix in the emergency room. He was admitted on telemetry with the working diagnosis of dyspnea secondary to CHF.  Subjective: Patient actually feels well, denies any chest pain shortness of breath, ambulating in the room on no oxygen    Assessment & Plan: Dyspnea secondary to CHF  - Echo shows EF of 15% with diffuse hypokinesis, diastolic dysfunction, and pulmonary hypertension. Etiology likely chronic untreated HTN - he also has a significant past history of substance abuse with cocaine.  - Elevated troponin from demand ischemia.  - Started on Carvedilol 3.125mg  and low dose spironolactone. Cardiology discontinued Aldactone today because of increasing creatinine Dr. [name] has started hydralazine 25 mg every 8, imdur 15 mg  we would like to monitor his blood pressure and his renal function for another 24 hours prior to discharge    Chronic Kidney Disease III - sCr today is 1.63>1.8 , which appears to be his baseline renal function  - Monitor with daily CMP. Encouraged good oral  hydration.  Hypothyroidism - TSH elevated at 5.868. Free T4 normal at 1.03.  - Continue with Synthroid 25 mcg daily.   Hypertension - Was not on any anti-htn medications prior to admission. He was started on Carvedilol and Spironolactone.  - BP much improved. Continue to closely monitor.  Abnormal LFTs - Hepatitis panel negative. Abdominal US shows only small right pleural effusion. Possibly reflex congestion vs immunotherapy vs radiation exposure.   Tonsillar Carcinoma  - Following with primary oncologist, Dr. Alvy Bimler.  DVT prophylaxis: Subcutaneous Lovenox Code Status: FULL Family Communication: None at bedside Disposition Plan: anticipate discharge tomorrow  Consultants:   Cardiology   Procedures:   Echocardiogram   Antimicrobials:  None   Objective: Vitals:   11/01/17 0500 11/01/17 0818 11/01/17 1247 11/01/17 1510  BP:  (!) 119/92 106/84 (!) 116/91  Pulse:  85 85 83  Resp:   (!) 24   Temp:   98.4 F (36.9 C)   TempSrc:   Oral   SpO2:  99% 98%   Weight: 118.6 kg (261 lb 7.5 oz)     Height:        Intake/Output Summary (Last 24 hours) at 11/01/2017 1517 Last data filed at 11/01/2017 1250 Gross per 24 hour  Intake 660 ml  Output 3125 ml  Net -2465 ml   Filed Weights   10/30/17 0406 10/31/17 0538 11/01/17 0500  Weight: 126.7 kg (279 lb 6.4 oz) 123.6 kg (272 lb 7.8 oz) 118.6 kg (261 lb 7.5 oz)   Examination:  General exam: Patient is awake and alert, in NAD. Appears calm and comfortable  Respiratory system: Good air entry. CTAB, no residual  crackles appreciated. Cardiovascular system: S1 & S2 heard, RRR. No JVD, murmurs, rubs or gallops Gastrointestinal system: Abdomen is nondistended, soft and nontender. No organomegaly or masses felt. Normal bowel sounds heard. Central nervous system: Alert and oriented. No focal neurological deficits. Extremities: Trace edema bilateral lower extremities. No cyanosis. Distal pulses intact.  Skin: No rashes, lesions  or ulcers Psychiatry: Judgement and insight appear normal. Mood & affect appropriate.   Data Reviewed: I have personally reviewed following labs and imaging studies  CBC: Recent Labs  Lab 10/29/17 1942 10/30/17 0548 11/01/17 0420  WBC 8.2 10.7* 7.9  NEUTROABS 7.1  --  6.8  HGB 12.2* 12.8* 13.2  HCT 38.4* 40.4 41.3  MCV 92.8 92.0 91.8  PLT 222 249 287   Basic Metabolic Panel: Recent Labs  Lab 10/29/17 1942 10/30/17 0548 10/31/17 0559 11/01/17 0420  NA 143 142 140 138  K 4.1 4.2 3.8 4.4  CL 108 107 104 98  CO2 28 27 28 31   GLUCOSE 100* 97 90 104*  BUN 20 18 20  26*  CREATININE 1.59* 1.55* 1.63* 1.84*  CALCIUM 8.5* 8.4* 7.7* 8.4*  MG  --   --  2.0 2.1   GFR: Estimated Creatinine Clearance: 69.5 mL/min (A) (by C-G formula based on SCr of 1.84 mg/dL (H)). Liver Function Tests: Recent Labs  Lab 10/29/17 1942 10/30/17 0548 11/01/17 0420  AST 46* 57* 36  ALT 70* 85* 63*  ALKPHOS 47 49 53  BILITOT 1.0 1.9* 1.0  PROT 5.6* 6.2* 6.0*  ALBUMIN 3.3* 3.6 3.3*   Cardiac Enzymes: Recent Labs  Lab 10/30/17 0515 10/30/17 1232 10/30/17 1937  TROPONINI 0.08* 0.08* 0.09*   Thyroid Function Tests: Recent Labs    10/30/17 0515  TSH 5.868*  FREET4 1.03   Sepsis Labs: Recent Labs  Lab 10/29/17 1950 10/29/17 2315  LATICACIDVEN 0.97 0.85   Radiology Studies: US Abdomen Complete  Result Date: 10/31/2017 CLINICAL DATA:  Elevated liver function tests. History of tonsillar carcinoma. EXAM: ABDOMEN ULTRASOUND COMPLETE COMPARISON:  CT abdomen and pelvis 07/23/2015. FINDINGS: Gallbladder: No gallstones or wall thickening visualized. No sonographic Murphy sign noted by sonographer. Common bile duct: Diameter: 0.5 cm Liver: No focal lesion identified. Within normal limits in parenchymal echogenicity. Portal vein is patent on color Doppler imaging with normal direction of blood flow towards the liver. IVC: No abnormality visualized. Pancreas: Visualized portion unremarkable.  Spleen: Size and appearance within normal limits. Right Kidney: Length: 10.8 cm. Echogenicity within normal limits. No mass or hydronephrosis visualized. Left Kidney: Length: 11.2 cm. Echogenicity within normal limits. No mass or hydronephrosis visualized. Abdominal aorta: No aneurysm visualized. Other findings: Small right pleural effusion is noted. IMPRESSION: Small right pleural effusion. The examination is otherwise negative. Electronically Signed   By: Inge Rise M.D.   On: 10/31/2017 10:38   Scheduled Meds: . carvedilol  3.125 mg Oral BID WC  . enoxaparin (LOVENOX) injection  40 mg Subcutaneous Daily  . fluticasone  2 spray Each Nare Daily  . hydrALAZINE  25 mg Oral Q8H  . isosorbide mononitrate  15 mg Oral Daily  . levothyroxine  25 mcg Oral QAC breakfast  . sodium chloride flush  3 mL Intravenous Q12H   Continuous Infusions: . sodium chloride      LOS: 2 days   Time spent: Total of 20 minutes spent with patient.  Meily Glowacki,MD   If 7PM-7AM, please contact night-coverage [email or web address] 11/01/2017, 3:17 PM

## 2017-11-01 NOTE — Telephone Encounter (Signed)
New Message:       TOC appt on 11/08/17 with Eulas Post per [place]

## 2017-11-02 ENCOUNTER — Telehealth: Payer: Self-pay | Admitting: Hematology and Oncology

## 2017-11-02 ENCOUNTER — Other Ambulatory Visit: Payer: Self-pay

## 2017-11-02 ENCOUNTER — Telehealth: Payer: Self-pay

## 2017-11-02 DIAGNOSIS — C099 Malignant neoplasm of tonsil, unspecified: Secondary | ICD-10-CM

## 2017-11-02 LAB — COMPREHENSIVE METABOLIC PANEL
ALT: 54 U/L — AB (ref 0–44)
AST: 30 U/L (ref 15–41)
Albumin: 3.3 g/dL — ABNORMAL LOW (ref 3.5–5.0)
Alkaline Phosphatase: 55 U/L (ref 38–126)
Anion gap: 9 (ref 5–15)
BUN: 24 mg/dL — AB (ref 6–20)
CALCIUM: 8.3 mg/dL — AB (ref 8.9–10.3)
CO2: 31 mmol/L (ref 22–32)
CREATININE: 1.69 mg/dL — AB (ref 0.61–1.24)
Chloride: 99 mmol/L (ref 98–111)
GFR calc Af Amer: 53 mL/min — ABNORMAL LOW (ref >60)
GFR, EST NON AFRICAN AMERICAN: 46 mL/min — AB (ref >60)
Glucose, Bld: 89 mg/dL (ref 70–99)
POTASSIUM: 4.1 mmol/L (ref 3.5–5.1)
Sodium: 139 mmol/L (ref 135–145)
Total Bilirubin: 1.2 mg/dL (ref 0.3–1.2)
Total Protein: 5.9 g/dL — ABNORMAL LOW (ref 6.5–8.1)

## 2017-11-02 MED ORDER — LEVOTHYROXINE SODIUM 25 MCG PO TABS: ORAL_TABLET | ORAL | 1 refills | Status: DC

## 2017-11-02 MED ORDER — HYDRALAZINE HCL 25 MG PO TABS: 25.0000 mg | ORAL_TABLET | Freq: Three times a day (TID) | ORAL | 1 refills | Status: DC

## 2017-11-02 MED ORDER — ISOSORBIDE MONONITRATE ER 30 MG PO TB24: 15.0000 mg | ORAL_TABLET | Freq: Every day | ORAL | 1 refills | Status: DC

## 2017-11-02 MED ORDER — CARVEDILOL 3.125 MG PO TABS: 3.1250 mg | ORAL_TABLET | Freq: Two times a day (BID) | ORAL | 1 refills | Status: DC

## 2017-11-02 MED ORDER — HEPARIN SOD (PORK) LOCK FLUSH 100 UNIT/ML IV SOLN
500.0000 [IU] | INTRAVENOUS | Status: AC | PRN
  Administered 2017-11-02: 500 [IU]

## 2017-11-02 MED ORDER — OXYCODONE HCL 15 MG PO TABS: 15.0000 mg | ORAL_TABLET | Freq: Three times a day (TID) | ORAL | 0 refills | Status: DC | PRN

## 2017-11-02 MED ORDER — METHOCARBAMOL 500 MG PO TABS
500.0000 mg | ORAL_TABLET | Freq: Three times a day (TID) | ORAL | Status: DC | PRN
  Administered 2017-11-02: 500 mg via ORAL
  Filled 2017-11-02: qty 1

## 2017-11-02 MED ORDER — METHOCARBAMOL 500 MG PO TABS: 500.0000 mg | ORAL_TABLET | Freq: Three times a day (TID) | ORAL | 0 refills | Status: DC | PRN

## 2017-11-02 MED FILL — hydrALAZINE HCL 25 MG TABS: 25 | 30 days supply | Qty: 90 | Fill #0

## 2017-11-02 MED FILL — ISOSORBIDE MN ER 30 MG TAB: 30 | 30 days supply | Qty: 15 | Fill #0

## 2017-11-02 MED FILL — METHOCARBAMOL 500 MG TABLET: 500 | 10 days supply | Qty: 30 | Fill #0

## 2017-11-02 MED FILL — CARVEDILOL 3.125 MG TABLET: 3.125 | 30 days supply | Qty: 60 | Fill #0

## 2017-11-02 MED FILL — LEVOTHYROXINE 25 MCG TABLET: 25 | 90 days supply | Qty: 90 | Fill #0

## 2017-11-02 MED FILL — oxyCODONE HCL 15 MG TABS: 15 | 30 days supply | Qty: 90 | Fill #0

## 2017-11-02 NOTE — Progress Notes (Signed)
Patient remains a&ox4 and ambulatory without assistance. No change from am assessment. Discharge instruction reviewed, questions concern denied.

## 2017-11-02 NOTE — Progress Notes (Signed)
Patient ambulated on room air, saturation maintained 94%. 60 ft

## 2017-11-02 NOTE — Telephone Encounter (Signed)
He called and left a message. He is in the hospital and supposed to be d/ced today. He was told to call and make appt to come in and get treatment.

## 2017-11-02 NOTE — Telephone Encounter (Signed)
Spoke to patient regarding upcoming July appts per 7/15 sch message

## 2017-11-02 NOTE — Discharge Summary (Signed)
Physician Discharge Summary  Mason [name] MRN: [id number] DOB/AGE: [birth year] 52 y.o.  PCP: Patient, [name]   Admit date: 10/29/2017 Discharge date: 11/02/2017  Discharge Diagnoses:    Principal Problem:   CHF (congestive heart failure) (HCC) Active Problems:   Renal insufficiency   Elevated LFTs    Follow-up recommendations Follow-up with PCP in 3-5 days , including all  additional recommended appointments as below Follow-up CBC,magnesium, CMP in 3-5 days Patient developed with cardiology next week, patient will need follow-up echo follow-up with oncology as scheduled     Allergies as of 11/02/2017      Reactions   Pollen Extract Other (See Comments)   Watery eyes, runny nose, sneezing      Medication List    TAKE these medications   albuterol 108 (90 Base) MCG/ACT inhaler Commonly known as:  PROVENTIL HFA;VENTOLIN HFA Inhale 2 puffs into the lungs every 6 (six) hours as needed for wheezing or shortness of breath.   carvedilol 3.125 MG tablet Commonly known as:  COREG Take 1 tablet (3.125 mg total) by mouth 2 (two) times daily with a meal.   chlorhexidine 0.12 % solution Commonly known as:  PERIDEX Rinse with 15 mls twice daily for 30 seconds. Use after breakfast and at bedtime. Spit out excess. Do not swallow.   fluticasone 50 MCG/ACT nasal spray Commonly known as:  FLONASE Place 2 sprays into both nostrils daily.   hydrALAZINE 25 MG tablet Commonly known as:  APRESOLINE Take 1 tablet (25 mg total) by mouth every 8 (eight) hours.   isosorbide mononitrate 30 MG 24 hr tablet Commonly known as:  IMDUR Take 0.5 tablets (15 mg total) by mouth daily.   levothyroxine 25 MCG tablet Commonly known as:  SYNTHROID, LEVOTHROID TAKE 1 TABLET BY MOUTH DAILY BEFORE BREAKFAST.   lidocaine-prilocaine cream Commonly known as:  EMLA Apply 1 application topically as needed.   methocarbamol 500 MG tablet Commonly known as:  ROBAXIN Take 1 tablet (500 mg  total) by mouth every 8 (eight) hours as needed for muscle spasms.   oxyCODONE 15 MG immediate release tablet Commonly known as:  ROXICODONE Take 1 tablet (15 mg total) by mouth every 8 (eight) hours as needed.        Discharge Condition:  stable  Discharge Instructions Get Medicines reviewed and adjusted: Please take all your medications with you for your next visit with your Primary MD  Please request your Primary MD to go over all hospital tests and procedure/radiological results at the follow up, please ask your Primary MD to get all Hospital records sent to his/her office.  If you experience worsening of your admission symptoms, develop shortness of breath, life threatening emergency, suicidal or homicidal thoughts you must seek medical attention immediately by calling 911 or calling your MD immediately  if symptoms less severe.  You must read complete instructions/literature along with all the possible adverse reactions/side effects for all the Medicines you take and that have been prescribed to you. Take any new Medicines after you have completely understood and accpet all the possible adverse reactions/side effects.   Do not drive when taking Pain medications.   Do not take more than prescribed Pain, Sleep and Anxiety Medications  Special Instructions: If you have smoked or chewed Tobacco  in the last 2 yrs please stop smoking, stop any regular Alcohol  and or any Recreational drug use.  Wear Seat belts while driving.  Please note  You were cared for by a  hospitalist during your hospital stay. Once you are discharged, your primary care physician will handle any further medical issues. Please note that NO REFILLS for any discharge medications will be authorized once you are discharged, as it is imperative that you return to your primary care physician (or establish a relationship with a primary care physician if you do not have one) for your aftercare needs so that they can  reassess your need for medications and monitor your lab values.     Allergies  Allergen Reactions  . Pollen Extract Other (See Comments)    Watery eyes, runny nose, sneezing      Disposition: Discharge disposition: 01-Home or Self Care        Consults  Cardiology    Significant Diagnostic Studies:  Dg Chest 2 View  Result Date: 10/29/2017 CLINICAL DATA:  Chest pain and shortness of breath with head neck cancer. EXAM: CHEST - 2 VIEW COMPARISON:  09/15/2017 CT.  07/13/2017 plain film. FINDINGS: Moderate lower thoracic spondylosis. Right Port-A-Cath which terminates at the low SVC. Midline trachea. Borderline cardiomegaly. Mediastinal contours otherwise within normal limits. No pleural effusion or pneumothorax. Diffuse peribronchial thickening. No lobar consolidation. IMPRESSION: No acute cardiopulmonary disease. Peribronchial thickening which may relate to chronic bronchitis or smoking. Electronically Signed   By: Abigail Miyamoto M.D.   On: 10/29/2017 20:23   Ct Angio Chest Pe W/cm &/or Wo Cm  Result Date: 10/29/2017 CLINICAL DATA:  Shortness of breath and leg swelling for several days. Dizziness. EXAM: CT ANGIOGRAPHY CHEST WITH CONTRAST TECHNIQUE: Multidetector CT imaging of the chest was performed using the standard protocol during bolus administration of intravenous contrast. Multiplanar CT image reconstructions and MIPs were obtained to evaluate the vascular anatomy. CONTRAST:  34m ISOVUE-370 IOPAMIDOL (ISOVUE-370) INJECTION 76% COMPARISON:  Plain films of earlier today.  CT 09/15/2017. FINDINGS: Cardiovascular: The quality of this exam for evaluation of pulmonary embolism is moderate. The bolus is well timed. The primary limitation is motion. No evidence of pulmonary embolism. A right-sided Port-A-Cath terminates at the superior caval/atrial junction. Mild cardiomegaly, without pericardial effusion. The aorta is normal in caliber but not well opacified secondary to bolus timing.  Mediastinum/Nodes: Precarinal node measures 1.3 cm and is newly enlarged. A node within the azygoesophageal recess is newly enlarged at 1.9 cm on 51/5. Right hilar adenopathy at 1.9 cm. Prevascular node of 10 mm is enlarged from 7 mm on the prior. Lungs/Pleura: Small pleural effusions, larger on the right. Mild to moderate motion degradation, most significant inferiorly. Mild centrilobular emphysema with superimposed mild interstitial edema, as evidenced by areas of heterogeneous ground-glass opacity and smooth septal thickening. A vague left apical 4 mm nodule on 45/11 is new since the prior, favored to represent a subpleural lymph node. Upper Abdomen: Contrast reflux into the hepatic veins and IVC, suggesting elevated right heart pressures. Motion degradation continuing into the upper abdomen. Normal imaged portions of the liver, spleen, stomach, pancreas, gallbladder, biliary tract, adrenal glands, kidneys. Musculoskeletal: Moderate thoracic spondylosis. Review of the MIP images confirms the above findings. IMPRESSION: 1. Moderate quality exam for pulmonary embolism with primary limitation of motion. No embolism identified. 2. Findings of congestive heart failure, including bilateral pleural effusions and septal thickening. 3. New thoracic adenopathy since approximately 6 weeks ago. Favor secondary to fluid overload/congestive heart failure. 4. New left apical 4 mm pulmonary nodule. Favored to represent a subpleural lymph node. Non-contrast chest CT can be considered in 12 months, given risk factors for primary bronchogenic carcinoma. This recommendation follows  the consensus statement: Guidelines for Management of Incidental Pulmonary Nodules Detected on CT Images: From the Fleischner Society 2017; Radiology 2017; 284:228-243. Electronically Signed   By: Abigail Miyamoto M.D.   On: 10/29/2017 22:31   US Abdomen Complete  Result Date: 10/31/2017 CLINICAL DATA:  Elevated liver function tests. History of tonsillar  carcinoma. EXAM: ABDOMEN ULTRASOUND COMPLETE COMPARISON:  CT abdomen and pelvis 07/23/2015. FINDINGS: Gallbladder: No gallstones or wall thickening visualized. No sonographic Murphy sign noted by sonographer. Common bile duct: Diameter: 0.5 cm Liver: No focal lesion identified. Within normal limits in parenchymal echogenicity. Portal vein is patent on color Doppler imaging with normal direction of blood flow towards the liver. IVC: No abnormality visualized. Pancreas: Visualized portion unremarkable. Spleen: Size and appearance within normal limits. Right Kidney: Length: 10.8 cm. Echogenicity within normal limits. No mass or hydronephrosis visualized. Left Kidney: Length: 11.2 cm. Echogenicity within normal limits. No mass or hydronephrosis visualized. Abdominal aorta: No aneurysm visualized. Other findings: Small right pleural effusion is noted. IMPRESSION: Small right pleural effusion. The examination is otherwise negative. Electronically Signed   By: Inge Rise M.D.   On: 10/31/2017 10:38    echocardiogram  LV EF: 15%  ------------------------------------------------------------------- History:   PMH:  CHF Evaluation 428.0.  Risk factors: Hypertension.  ------------------------------------------------------------------- Study Conclusions  - Left ventricle: The cavity size was severely dilated. Wall   thickness was normal. Systolic function was severely reduced. The   estimated ejection fraction was 15%. Diffuse hypokinesis. Doppler   parameters are consistent with restrictive physiology, indicative   of decreased left ventricular diastolic compliance and/or   increased left atrial pressure. - Mitral valve: There was mild regurgitation. - Left atrium: The atrium was mildly dilated. - Right ventricle: The cavity size was mildly dilated. Systolic   function was moderately reduced. - Pulmonary arteries: Systolic pressure was moderately increased.   PA peak pressure: 59 mm Hg (S). -  Pericardium, extracardiac: A trivial pericardial effusion was   identified.  Impressions:  - Definity used; severe global reduction in LV systolic function;   severe LVE; restrictive filling; mild MR; mild LAE; mild RVE with   moderate RV dysfunction; mild TR with moderate pulmonary   hypertension.     Filed Weights   10/31/17 0538 11/01/17 0500 11/02/17 0451  Weight: 123.6 kg (272 lb 7.8 oz) 118.6 kg (261 lb 7.5 oz) 119.5 kg (263 lb 7.2 oz)     Microbiology: No results found for this or any previous visit (from the past 240 hour(s)).     Blood Culture    Component Value Date/Time   SDES URINE, CLEAN CATCH 08/27/2013 1851   SPECREQUEST NONE 08/27/2013 1851   CULT NO GROWTH Performed at [facility] 08/27/2013 1851   REPTSTATUS 08/29/2013 FINAL 08/27/2013 1851      Labs: Results for orders placed or performed during the hospital encounter of 10/29/17 (from the past 48 hour(s))  Magnesium     Status: None   Collection Time: 11/01/17  4:20 AM  Result Value Ref Range   Magnesium 2.1 1.7 - 2.4 mg/dL    Comment: Performed at [facility], [place] [address] [place], [place] [address]  CBC with Differential/Platelet     Status: None   Collection Time: 11/01/17  4:20 AM  Result Value Ref Range   WBC 7.9 4.0 - 10.5 K/uL   RBC 4.50 4.22 - 5.81 MIL/uL   Hemoglobin 13.2 13.0 - 17.0 g/dL   HCT 41.3 39.0 - 52.0 %  MCV 91.8 78.0 - 100.0 fL   MCH 29.3 26.0 - 34.0 pg   MCHC 32.0 30.0 - 36.0 g/dL   RDW 15.5 11.5 - 15.5 %   Platelets 245 150 - 400 K/uL   Neutrophils Relative % 86 %   Neutro Abs 6.8 1.7 - 7.7 K/uL   Lymphocytes Relative 8 %   Lymphs Abs 0.7 0.7 - 4.0 K/uL   Monocytes Relative 5 %   Monocytes Absolute 0.4 0.1 - 1.0 K/uL   Eosinophils Relative 1 %   Eosinophils Absolute 0.1 0.0 - 0.7 K/uL   Basophils Relative 0 %   Basophils Absolute 0.0 0.0 - 0.1 K/uL    Comment: Performed at [facility], [place] [address],  [place], [place] [address]  Comprehensive metabolic panel     Status: Abnormal   Collection Time: 11/01/17  4:20 AM  Result Value Ref Range   Sodium 138 135 - 145 mmol/L   Potassium 4.4 3.5 - 5.1 mmol/L   Chloride 98 98 - 111 mmol/L    Comment: Please note change in reference range.   CO2 31 22 - 32 mmol/L   Glucose, Bld 104 (H) 70 - 99 mg/dL    Comment: Please note change in reference range.   BUN 26 (H) 6 - 20 mg/dL    Comment: Please note change in reference range.   Creatinine, Ser 1.84 (H) 0.61 - 1.24 mg/dL   Calcium 8.4 (L) 8.9 - 10.3 mg/dL   Total Protein 6.0 (L) 6.5 - 8.1 g/dL   Albumin 3.3 (L) 3.5 - 5.0 g/dL   AST 36 15 - 41 U/L   ALT 63 (H) 0 - 44 U/L    Comment: Please note change in reference range.   Alkaline Phosphatase 53 38 - 126 U/L   Total Bilirubin 1.0 0.3 - 1.2 mg/dL   GFR calc non Af Amer 41 (L) >60 mL/min   GFR calc Af Amer 48 (L) >60 mL/min    Comment: (NOTE) The eGFR has been calculated using the CKD EPI equation. This calculation has not been validated in all clinical situations. eGFR's persistently <60 mL/min signify possible Chronic Kidney Disease.    Anion gap 9 5 - 15    Comment: Performed at [facility], [place] [address], [place], [place] [address]  Brain natriuretic peptide     Status: Abnormal   Collection Time: 11/01/17  4:20 AM  Result Value Ref Range   B Natriuretic Peptide 1,018.8 (H) 0.0 - 100.0 pg/mL    Comment: Performed at [facility], [place] [address], [place],  [address]  Comprehensive metabolic panel     Status: Abnormal   Collection Time: 11/02/17  4:00 AM  Result Value Ref Range   Sodium 139 135 - 145 mmol/L   Potassium 4.1 3.5 - 5.1 mmol/L   Chloride 99 98 - 111 mmol/L    Comment: Please note change in reference range.   CO2 31 22 - 32 mmol/L   Glucose, Bld 89 70 - 99 mg/dL    Comment: Please note change in reference range.   BUN 24 (H) 6 - 20 mg/dL    Comment: Please note change in  reference range.   Creatinine, Ser 1.69 (H) 0.61 - 1.24 mg/dL   Calcium 8.3 (L) 8.9 - 10.3 mg/dL   Total Protein 5.9 (L) 6.5 - 8.1 g/dL   Albumin 3.3 (L) 3.5 - 5.0 g/dL   AST 30 15 - 41  U/L   ALT 54 (H) 0 - 44 U/L    Comment: Please note change in reference range.   Alkaline Phosphatase 55 38 - 126 U/L   Total Bilirubin 1.2 0.3 - 1.2 mg/dL   GFR calc non Af Amer 46 (L) >60 mL/min   GFR calc Af Amer 53 (L) >60 mL/min    Comment: (NOTE) The eGFR has been calculated using the CKD EPI equation. This calculation has not been validated in all clinical situations. eGFR's persistently <60 mL/min signify possible Chronic Kidney Disease.    Anion gap 9 5 - 15    Comment: Performed at [facility], [place] [address] [place], [place] [address]     Lipid Panel  No results found for: CHOL, TRIG, HDL, CHOLHDL, VLDL, LDLCALC, LDLDIRECT   Lab Results  Component Value Date   HGBA1C 5.7 08/09/2013     Lab Results  Component Value Date   CREATININE 1.69 (H) 11/02/2017     HPI :  ATHEL MERRIWEATHER is a 52 y.o. 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 Quincy Simmonds.. Mason [name] has no known past CAD, but dx with tonsillar cancer in 2015 and treated with chemo - cisplatin, then carbo/taxol and radiation, then recurrence and treated with 16 cycles of Keytruda, and in remission high risk for recurrence so continued chemo.     Pt presented to ER 10/30/17 with SOB and dizziness-- dyspnea for 4 weeks.  He had complained of chest pain last seconds as well. He had lower ext edema as well.  Other hs of pneumonitis treated with prednisone and COPD.  Today he tells me he had no chest pain.  The SOB occurred about 4 weeks ago and the edema  last week.    Neg PE on CTA of chest + CHF and new lt apical 4 mm Pulmonary nodule.    Troponin flat at 0.08 X 3 and 0.09 X 1 EKG I personally reviewed with SR and PACs, new T wave inversion in lateral leads.  Telemetry:  Telemetry was personally reviewed and demonstrates:  SR with bursts of 5 beats of NSVT.   Echo 10/30/17  EF 15% LV severely dilated, diffuse hypokinesis, restrictive physiology, Mild MR, LA mildly dilated, RV mildly dilated, systolic function was moderately reduced PA pk pressure 59 mmHg and trivial pericardial effusion        HOSPITAL COURSE:   Dyspnea secondary to acute on chronic systolic CHF  - Echo shows EF of 15% with diffuse hypokinesis, diastolic dysfunction, and pulmonary hypertension. Etiology likely chronic untreated HTN - he also has a significant past history of substance abuse with cocaine.  - Elevated troponin from demand ischemia.  - Started on Carvedilol 3.158m and low dose spironolactone. Cardiology discontinued Aldactone today because of increasing creatinine Dr. PDorris Carneshas started hydralazine 25 mg every 8, imdur 15 mg Creatinine improved to 1.69, BNP 1018.8 on the day of discharge, weight 263 pounds down from 278 pounds on admission    Chronic Kidney Disease III - sCr today is 1.63>1.8 >1.69  , which appears to be his baseline renal function  - Monitor with daily CMP. Encouraged good oral hydration.  Hypothyroidism - TSH elevated at 5.868.  Free T4 normal at 1.03.  - Continue with Synthroid 25 mcg daily.   Hypertension - Was not on any anti-htn medications prior to admission. He was started on Carvedilol and  hydralazine - BP much improved. Continue to closely monitor.  Abnormal LFTs - Hepatitis panel negative. Abdominal US shows only small right pleural effusion. Possibly reflex congestion vs immunotherapy vs radiation exposure.   Tonsillar Carcinoma  - Following with primary oncologist, Dr. Alvy Bimler.     Discharge Exam   Blood pressure 111/79, pulse 89, temperature 98.9 F (37.2 C), temperature source Oral, resp. rate 12, height _0  (1.981 m), weight 119.5 kg (263 lb 7.2 oz), SpO2 98 %.   General:  Well nourished, well developed, in no acute distress- just back from abd ultra sound HEENT: Edentulous -otherwise normal Lymph: no adenopathy Neck: mild JVD Endocrine:  No thryomegaly Vascular: No carotid bruits; pedal pulses 2+ bilaterally  Cardiac:  normal S1, S2; RRR; no murmur, gallup, rub or click  Lungs:  clear to auscultation bilaterally, no wheezing, rhonchi or rales  Abd: soft, nontender, no hepatomegaly  Ext: no edema Musculoskeletal:  No deformities, BUE and BLE strength normal and equal Skin: warm and dry      Follow-up Information    [facility]. Go on 11/03/2017.   Specialty:  Cardiology Why:  for blood work between 7:30am to 5 pm.  Contact information: [address], [address] [place]       [place], [place]. Go on 11/08/2017.   Specialties:  Cardiology, Radiology Why:  _1  for hosptial follow up. please call if needs to reschedule Contact information: [address] [place] [address] [phone]           Signed: Reyne Dumas 11/02/2017, 11:00 AM      Time needed to  prepare  discharge, discussed with the patient and family 35 minutes

## 2017-11-02 NOTE — Progress Notes (Signed)
Progress Note  Patient Name: Mason [name] Date of Encounter: 11/02/2017  Primary Cardiologist: [name], MD   Subjective   Breathing is OK   NO CP    Inpatient Medications    Scheduled Meds: . carvedilol  3.125 mg Oral BID WC  . enoxaparin (LOVENOX) injection  40 mg Subcutaneous Daily  . fluticasone  2 spray Each Nare Daily  . hydrALAZINE  25 mg Oral Q8H  . isosorbide mononitrate  15 mg Oral Daily  . levothyroxine  25 mcg Oral QAC breakfast  . sodium chloride flush  3 mL Intravenous Q12H   Continuous Infusions: . sodium chloride     PRN Meds:  Vital Signs    Vitals:   11/02/17 0451 11/02/17 0455 11/02/17 0610 11/02/17 1030  BP:  (!) 118/93  111/79  Pulse:  (!) 107 92 89  Resp:  12    Temp:  98.9 F (37.2 C)    TempSrc:  Oral    SpO2:  98%    Weight: 119.5 kg (263 lb 7.2 oz)     Height:        Intake/Output Summary (Last 24 hours) at 11/02/2017 1148 Last data filed at 11/02/2017 0710 Gross per 24 hour  Intake 820 ml  Output 1500 ml  Net -680 ml   Filed Weights   10/31/17 0538 11/01/17 0500 11/02/17 0451  Weight: 123.6 kg (272 lb 7.8 oz) 118.6 kg (261 lb 7.5 oz) 119.5 kg (263 lb 7.2 oz)    Telemetry    SR  - Personally Reviewed  ECG     Physical Exam   GEN: No acute distress.   Neck: JVP normal   Cardiac: RRR, no murmurs, rubs, or gallops.  Respiratory:Mild wheeze and rhonchi bilateral base GI: Soft, nontender, non-distended  MS: Tr edema; No deformity. Neuro:  Nonfocal  Psych: Normal affect   Labs    Chemistry Recent Labs  Lab 10/30/17 0548 10/31/17 0559 11/01/17 0420 11/02/17 0400  NA 142 140 138 139  K 4.2 3.8 4.4 4.1  CL 107 104 98 99  CO2 27 28 31 31   GLUCOSE 97 90 104* 89  BUN 18 20 26* 24*  CREATININE 1.55* 1.63* 1.84* 1.69*  CALCIUM 8.4* 7.7* 8.4* 8.3*  PROT 6.2*  --  6.0* 5.9*  ALBUMIN 3.6  --  3.3* 3.3*  AST 57*  --  36 30  ALT 85*  --  63* 54*  ALKPHOS 49  --  53 55  BILITOT 1.9*  --  1.0 1.2  GFRNONAA 51*  48* 41* 46*  GFRAA 59* 55* 48* 53*  ANIONGAP 8 8 9 9      Hematology Recent Labs  Lab 10/29/17 1942 10/30/17 0548 11/01/17 0420  WBC 8.2 10.7* 7.9  RBC 4.14* 4.39 4.50  HGB 12.2* 12.8* 13.2  HCT 38.4* 40.4 41.3  MCV 92.8 92.0 91.8  MCH 29.5 29.2 29.3  MCHC 31.8 31.7 32.0  RDW 15.9* 15.7* 15.5  PLT 222 249 245    Cardiac Enzymes Recent Labs  Lab 10/30/17 0515 10/30/17 1232 10/30/17 1937  TROPONINI 0.08* 0.08* 0.09*    Recent Labs  Lab 10/30/17 0018  TROPIPOC 0.08     BNP Recent Labs  Lab 10/29/17 1942 11/01/17 0420  BNP 1,406.6* 1,018.8*     DDimer No results for input(s): DDIMER in the last 168 hours.   Radiology    No results found.  Cardiac Studies   Echo Study Conclusions  - Left ventricle:  The cavity size was severely dilated. Wall   thickness was normal. Systolic function was severely reduced. The   estimated ejection fraction was 15%. Diffuse hypokinesis. Doppler   parameters are consistent with restrictive physiology, indicative   of decreased left ventricular diastolic compliance and/or   increased left atrial pressure. - Mitral valve: There was mild regurgitation. - Left atrium: The atrium was mildly dilated. - Right ventricle: The cavity size was mildly dilated. Systolic   function was moderately reduced. - Pulmonary arteries: Systolic pressure was moderately increased.   PA peak pressure: 59 mm Hg (S). - Pericardium, extracardiac: A trivial pericardial effusion was   identified.   Assessment & Plan    1  Acute systolic CHF   Volume status  Much improved from admit  Keep on current regiemn Hold on ischemic work up for now   No CP      WIll get appt for pt to see me on Tuesday   Paula Ross     For questions or updates, please contact [facility] Please consult [email or web address] for contact info under Cardiology/STEMI.      Signed, [name], MD  11/02/2017, 11:48 AM

## 2017-11-02 NOTE — Telephone Encounter (Signed)
Patient discharged 11/02/17 - first [facility] outreach should be 11/03/17

## 2017-11-02 NOTE — Progress Notes (Signed)
Per discharge instructions pt would have hard prescriptions. Not available on chart. Provider contacted and reports prescriptions would not be needed as medications are not new.

## 2017-11-02 NOTE — Telephone Encounter (Signed)
Called with below message. He verbalized understanding. Requesting refill on Oxycodone and Synthroid. He will come and pick up Rx after d/c from hospital.

## 2017-11-02 NOTE — Telephone Encounter (Signed)
Mason [name] has already reached out to me I sent scheduling msg 3 days ago

## 2017-11-03 ENCOUNTER — Other Ambulatory Visit: Payer: Self-pay

## 2017-11-03 NOTE — Telephone Encounter (Signed)
Patient contacted regarding discharge from [facility] on 11/02/17. Patient understands to follow up with Dr. Harrington Challenger on 11/07/17 at 9:00 am at [facility] office.  Patient understands discharge instructions? Yes Patient understands medications and regiment? Yes  Patient understands to bring all medications to this visit? Yes Instructions indicate pt needs lab work on 11/03/17; however it also indicates CBC, Mg and CMP are to be collected in 3-5 days.  Advised patient we will collect on 11/07/17.

## 2017-11-07 ENCOUNTER — Inpatient Hospital Stay (HOSPITAL_BASED_OUTPATIENT_CLINIC_OR_DEPARTMENT_OTHER): Payer: Medicaid Other | Admitting: Hematology and Oncology

## 2017-11-07 ENCOUNTER — Encounter: Payer: Self-pay | Admitting: Hematology and Oncology

## 2017-11-07 ENCOUNTER — Inpatient Hospital Stay: Payer: Medicaid Other

## 2017-11-07 ENCOUNTER — Other Ambulatory Visit: Payer: Self-pay | Admitting: Internal Medicine

## 2017-11-07 ENCOUNTER — Ambulatory Visit: Payer: Medicaid Other | Admitting: Internal Medicine

## 2017-11-07 ENCOUNTER — Inpatient Hospital Stay: Payer: Medicaid Other | Attending: Hematology and Oncology

## 2017-11-07 ENCOUNTER — Telehealth: Payer: Self-pay | Admitting: *Deleted

## 2017-11-07 ENCOUNTER — Encounter: Payer: Self-pay | Admitting: Internal Medicine

## 2017-11-07 VITALS — BP 108/68 | HR 90 | Ht 79.0 in | Wt 267.1 lb

## 2017-11-07 DIAGNOSIS — Z923 Personal history of irradiation: Secondary | ICD-10-CM | POA: Insufficient documentation

## 2017-11-07 DIAGNOSIS — I5043 Acute on chronic combined systolic (congestive) and diastolic (congestive) heart failure: Secondary | ICD-10-CM | POA: Diagnosis not present

## 2017-11-07 DIAGNOSIS — I429 Cardiomyopathy, unspecified: Secondary | ICD-10-CM | POA: Diagnosis not present

## 2017-11-07 DIAGNOSIS — N182 Chronic kidney disease, stage 2 (mild): Secondary | ICD-10-CM | POA: Insufficient documentation

## 2017-11-07 DIAGNOSIS — Z79899 Other long term (current) drug therapy: Secondary | ICD-10-CM | POA: Insufficient documentation

## 2017-11-07 DIAGNOSIS — E039 Hypothyroidism, unspecified: Secondary | ICD-10-CM

## 2017-11-07 DIAGNOSIS — I509 Heart failure, unspecified: Secondary | ICD-10-CM

## 2017-11-07 DIAGNOSIS — C090 Malignant neoplasm of tonsillar fossa: Secondary | ICD-10-CM

## 2017-11-07 DIAGNOSIS — I5021 Acute systolic (congestive) heart failure: Secondary | ICD-10-CM

## 2017-11-07 DIAGNOSIS — C099 Malignant neoplasm of tonsil, unspecified: Secondary | ICD-10-CM | POA: Insufficient documentation

## 2017-11-07 DIAGNOSIS — Z7982 Long term (current) use of aspirin: Secondary | ICD-10-CM

## 2017-11-07 DIAGNOSIS — G893 Neoplasm related pain (acute) (chronic): Secondary | ICD-10-CM

## 2017-11-07 DIAGNOSIS — Z5112 Encounter for antineoplastic immunotherapy: Secondary | ICD-10-CM | POA: Insufficient documentation

## 2017-11-07 DIAGNOSIS — Z9221 Personal history of antineoplastic chemotherapy: Secondary | ICD-10-CM | POA: Insufficient documentation

## 2017-11-07 DIAGNOSIS — R6884 Jaw pain: Secondary | ICD-10-CM | POA: Diagnosis not present

## 2017-11-07 DIAGNOSIS — C770 Secondary and unspecified malignant neoplasm of lymph nodes of head, face and neck: Secondary | ICD-10-CM

## 2017-11-07 LAB — CBC WITH DIFFERENTIAL/PLATELET
BASOS PCT: 1 %
Basophils Absolute: 0.1 10*3/uL (ref 0.0–0.1)
EOS ABS: 0.1 10*3/uL (ref 0.0–0.5)
Eosinophils Relative: 1 %
HCT: 39 % (ref 38.4–49.9)
Hemoglobin: 12.5 g/dL — ABNORMAL LOW (ref 13.0–17.1)
Lymphocytes Relative: 8 %
Lymphs Abs: 0.6 10*3/uL — ABNORMAL LOW (ref 0.9–3.3)
MCH: 29.2 pg (ref 27.2–33.4)
MCHC: 32.1 g/dL (ref 32.0–36.0)
MCV: 91.1 fL (ref 79.3–98.0)
MONO ABS: 0.4 10*3/uL (ref 0.1–0.9)
MONOS PCT: 5 %
NEUTROS PCT: 85 %
Neutro Abs: 6.3 10*3/uL (ref 1.5–6.5)
Platelets: 264 10*3/uL (ref 140–400)
RBC: 4.29 MIL/uL (ref 4.20–5.82)
RDW: 16.7 % — AB (ref 11.0–14.6)
WBC: 7.4 10*3/uL (ref 4.0–10.3)

## 2017-11-07 LAB — COMPREHENSIVE METABOLIC PANEL
ALBUMIN: 3.5 g/dL (ref 3.5–5.0)
ALT: 27 U/L (ref 0–44)
ANION GAP: 4 — AB (ref 5–15)
AST: 21 U/L (ref 15–41)
Alkaline Phosphatase: 68 U/L (ref 38–126)
BUN: 17 mg/dL (ref 6–20)
CHLORIDE: 107 mmol/L (ref 98–111)
CO2: 29 mmol/L (ref 22–32)
Calcium: 9.1 mg/dL (ref 8.9–10.3)
Creatinine, Ser: 1.75 mg/dL — ABNORMAL HIGH (ref 0.61–1.24)
GFR calc Af Amer: 51 mL/min — ABNORMAL LOW (ref >60)
GFR calc non Af Amer: 44 mL/min — ABNORMAL LOW (ref >60)
Glucose, Bld: 110 mg/dL — ABNORMAL HIGH (ref 70–99)
POTASSIUM: 4.3 mmol/L (ref 3.5–5.1)
SODIUM: 140 mmol/L (ref 135–145)
Total Bilirubin: 0.6 mg/dL (ref 0.3–1.2)
Total Protein: 6.5 g/dL (ref 6.5–8.1)

## 2017-11-07 LAB — TSH: TSH: 2.937 u[IU]/mL (ref 0.320–4.118)

## 2017-11-07 MED ORDER — SODIUM CHLORIDE 0.9% FLUSH
10.0000 mL | INTRAVENOUS | Status: DC | PRN
  Administered 2017-11-07: 10 mL
  Filled 2017-11-07: qty 10

## 2017-11-07 MED ORDER — SODIUM CHLORIDE 0.9 % IJ SOLN
10.0000 mL | INTRAMUSCULAR | Status: DC | PRN
  Administered 2017-11-07: 10 mL via INTRAVENOUS
  Filled 2017-11-07: qty 10

## 2017-11-07 MED ORDER — HEPARIN SOD (PORK) LOCK FLUSH 100 UNIT/ML IV SOLN
500.0000 [IU] | Freq: Once | INTRAVENOUS | Status: AC | PRN
  Administered 2017-11-07: 500 [IU]
  Filled 2017-11-07: qty 5

## 2017-11-07 MED ORDER — SODIUM CHLORIDE 0.9 % IV SOLN
200.0000 mg | Freq: Once | INTRAVENOUS | Status: AC
  Administered 2017-11-07: 200 mg via INTRAVENOUS
  Filled 2017-11-07: qty 8

## 2017-11-07 MED ORDER — SODIUM CHLORIDE 0.9 % IV SOLN
Freq: Once | INTRAVENOUS | Status: AC
  Administered 2017-11-07: 13:00:00 via INTRAVENOUS

## 2017-11-07 NOTE — Assessment & Plan Note (Signed)
He has stable chronic kidney disease We will monitor closely and he will continue risk factor management

## 2017-11-07 NOTE — Patient Instructions (Signed)
[facility] Discharge Instructions for Patients Receiving Chemotherapy  Today you received the following chemotherapy agents:  Keytruda.  To help prevent nausea and vomiting after your treatment, we encourage you to take your nausea medication as directed.   If you develop nausea and vomiting that is not controlled by your nausea medication, call the clinic.   BELOW ARE SYMPTOMS THAT SHOULD BE REPORTED IMMEDIATELY:  *FEVER GREATER THAN 100.5 F  *CHILLS WITH OR WITHOUT FEVER  NAUSEA AND VOMITING THAT IS NOT CONTROLLED WITH YOUR NAUSEA MEDICATION  *UNUSUAL SHORTNESS OF BREATH  *UNUSUAL BRUISING OR BLEEDING  TENDERNESS IN MOUTH AND THROAT WITH OR WITHOUT PRESENCE OF ULCERS  *URINARY PROBLEMS  *BOWEL PROBLEMS  UNUSUAL RASH Items with * indicate a potential emergency and should be followed up as soon as possible.  Feel free to call the clinic should you have any questions or concerns. The clinic phone number is [phone].  Please show the CHEMO ALERT CARD at check-in to the Emergency Department and triage nurse.    

## 2017-11-07 NOTE — Progress Notes (Signed)
Cardiology Office Note   Date:  11/07/2017   ID:  Mason [name], DOB 12-Oct-1965, MRN [id number]  PCP:  Patient, [name]  Cardiologist:   [name], MD   Pt presents for post hosp f/u of CHF     History of Present Illness: Mason [name] is a 52 y.o. male who was admitted to [facility]  With SOB and dizziness This had been developing for several wks    Also complained of LE edemia and sharp chest pains (lasting seconds)  CT of chest neg for PE   Trop minimally elevated (0.03 x3, 0.09)  Echo done shen showed severe LV dysfunction at 15%   RV functio nwas moderately reduced    EKG without Q waves. The pt was started on Coreg Duresed with IV lasix   Hydralazine and imdur added.  Since d/c the pt has felt OK   He denies CP   Breathing is OK   Triv edema   Watching salt intake   Has not gotten scale yet       Current Meds  Medication Sig  . albuterol (PROVENTIL HFA;VENTOLIN HFA) 108 (90 Base) MCG/ACT inhaler Inhale 2 puffs into the lungs every 6 (six) hours as needed for wheezing or shortness of breath.  . carvedilol (COREG) 3.125 MG tablet Take 1 tablet (3.125 mg total) by mouth 2 (two) times daily with a meal.  . fluticasone (FLONASE) 50 MCG/ACT nasal spray Place 2 sprays into both nostrils daily.  . hydrALAZINE (APRESOLINE) 25 MG tablet Take 1 tablet (25 mg total) by mouth every 8 (eight) hours.  . isosorbide mononitrate (IMDUR) 30 MG 24 hr tablet Take 0.5 tablets (15 mg total) by mouth daily.  Mason [name] levothyroxine (SYNTHROID, LEVOTHROID) 25 MCG tablet TAKE 1 TABLET BY MOUTH DAILY BEFORE BREAKFAST.  Mason [name] lidocaine-prilocaine (EMLA) cream Apply 1 application topically as needed.  . methocarbamol (ROBAXIN) 500 MG tablet Take 1 tablet (500 mg total) by mouth every 8 (eight) hours as needed for muscle spasms.  . nitroGLYCERIN (NITROGLYN) 2 % ointment Apply topically as directed. Apply 0.5 inches topically every 8 hours  . oxyCODONE (ROXICODONE) 15 MG immediate release tablet Take 1  tablet (15 mg total) by mouth every 8 (eight) hours as needed.     Allergies:   Pollen extract   Past Medical History:  Diagnosis Date  . Abnormal liver function test   . Acute sinusitis, unspecified 05/18/2015  . Anemia   . Anxiety    mild new dx  . Arthritis    knees,hips  . Bilateral edema of lower extremity   . Complication of anesthesia    Pt stated " my oxygen level was slow in rising."  . Concussion    Hx: in high school x 2  . Constipation   . Dysuria 09/04/2015  . Fever   . Hyperactive gag reflex   . Hypertension   . Hypoglycemia   . Hypokalemia   . Insomnia 06/15/2015  . Knee pain, chronic   . Malnutrition ([place])   . Non-healing surgical wound 05/23/2014  . PEG (percutaneous endoscopic gastrostomy) status ([place])   . Renal failure, acute ([place])   . S/P radiation therapy 08/19/2013-10/15/2013   Right Tonstil and bilateral neck / 70 Gy in 35 fractions to gross disease, 63 Gy in 35 fractions to high risk nodal echelons, and 56 Gy in 35 fractions to intermediate risk nodal echelons  . Severe nausea and vomiting   . Status post chemotherapy  Only received 2 doses due to uncontrolled nausea and acute renal failure  . 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 LAPAROSCOPIC GASTROSTOMY N/A 08/15/2013   Procedure: LAPAROSCOPIC GASTROSTOMY TUBE PLACEMENT;  Surgeon: [name], MD;  Location: [facility];  Service: General;  Laterality: N/A;  . LYMPH NODE BIOPSY  03/20/14   right neck  . MULTIPLE EXTRACTIONS WITH ALVEOLOPLASTY N/A 08/01/2013   Procedure: Extraction of tooth #'s 1,15,17,31, 32 with alveoloplasty, mandibular left torus reduction, and gross debridement of remaining teeth.;  Surgeon: [name], DDS;  Location: [facility];  Service: Oral Surgery;  Laterality: N/A;  . PORT-A-CATH REMOVAL N/A 05/13/2014   Procedure: REMOVAL of PORT-A-CATH;  Surgeon: [name], MD;  Location: [facility];   Service: General;  Laterality: N/A;  . PORTACATH PLACEMENT Left 08/15/2013   Procedure: INSERTION PORT-A-CATH;  Surgeon: [name], MD;  Location: [facility];  Service: General;  Laterality: Left;     Social History:  The patient  reports that he quit smoking about 14 years ago. His smoking use included cigarettes. He has a 20.00 pack-year smoking history. He has never used smokeless tobacco. He reports that he has current or past drug history. Drugs: Marijuana and Benzodiazepines. He reports that he does not drink alcohol.   Family History:  The patient's family history includes Arthritis in his father and mother; CVA in his maternal grandmother and unknown relative; Diabetes in his unknown relative; Heart attack in his unknown relative; Heart disease in his maternal grandmother and mother.    ROS:  Please see the history of present illness. All other systems are reviewed and  Negative to the above problem except as noted.    PHYSICAL EXAM: VS:  BP 108/68 (BP Location: Right Arm, Patient Position: Sitting, Cuff Size: Normal)   Pulse 90   Ht 6\' 7"  (2.007 m)   Wt 267 lb 1.9 oz (121.2 kg)   SpO2 97%   BMI 30.09 kg/m   GEN: Well nourished, well developed, in no acute distress  HEENT: normal  Neck: no JVD, carotid bruits, or masses Cardiac: RRR; no murmurs, rubs, or gallops,Tr edema  Respiratory:  clear to auscultation bilaterally, normal work of breathing GI: soft, nontender, nondistended, + BS  No hepatomegaly  MS: no deformity Moving all extremities   Skin: warm and dry, no rash Neuro:  Strength and sensation are intact Psych: euthymic mood, full affect   EKG:  EKG is not ordered today.   Lipid Panel No results found for: CHOL, TRIG, HDL, CHOLHDL, VLDL, LDLCALC, LDLDIRECT    Wt Readings from Last 3 Encounters:  11/07/17 267 lb 1.9 oz (121.2 kg)  11/02/17 263 lb 7.2 oz (119.5 kg)  10/09/17 278 lb 12.8 oz (126.5 kg)      ASSESSMENT AND PLAN:  1  Acute on chronic  systolic CHF     The pt was recnetly admitted and diuresed significantly    Etiology is still unclear  I do not think ischemic as he has had no angina, RV and LV are involved and there are no Q Waves I would recomm continued medical Rx    He needs to weigh himself daily   Call if wt starts to bump up     Will need f/u appt to reassess volume status   May need lasix with home diet  Would recomm labs today      Pt  will need repeat echo in about 3 months     2  Tonsillar CA    Follows in cancer center      3  Dizziness  Transient on current meds     Current medicines are reviewed at length with the patient today.  The patient does not have concerns regarding medicines.  Signed, [name], MD  11/07/2017 10:00 AM    [address], [place], [place]  [address] Phone: [phone]; Fax: [phone]

## 2017-11-07 NOTE — Assessment & Plan Note (Signed)
Clinically, I could not detect any signs of persistent cancer Recent CT scan of the chest is stable Nevertheless, he is at risk of recurrence We discussed the risk and benefits of maintaining treatment and he agreed to proceed We will resume treatment today

## 2017-11-07 NOTE — Progress Notes (Signed)
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 Recent CT scan of the chest is stable Nevertheless, he is at risk of recurrence We discussed the risk and benefits of maintaining treatment and he agreed to proceed We will resume treatment today  CHF (congestive heart failure) ([place]) He was recently diagnosed with congestive heart failure secondary to unknown cause of cardiomyopathy He is currently on medical management I will defer to cardiologist for that The immunotherapy is not the cause of this  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   INTERVAL HISTORY: Please see below for problem oriented charting. 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 FNA of right level III lymph node was inconclusive for cancer      07/25/2013 Imaging    PET scan showed locally advanced disease with abnormal lymphadenopathy in the right axilla      08/06/2013 Procedure    CT-guided biopsy of the lymphadenopathy was negative for malignancy      08/15/2013 Surgery    Patient has placement of port and feeding tube      08/19/2013 - 09/10/2013 Chemotherapy    Patient received chemotherapy with cisplatin. The patient only received 2 doses due to uncontrolled nausea and acute renal failure.      08/19/2013 - 10/15/2013 Radiation Therapy    Received Helical IMRT Tomotherapy:  Right Tonstil and bilateral neck / 70 Gy in 35 fractions to gross disease, 63 Gy in 35 fractions to high risk nodal echelons, and 56 Gy in 35 fractions to intermediate risk nodal echelons.      08/27/2013 - 08/30/2013 Hospital Admission    The patient was admitted to the hospital for uncontrolled nausea vomiting and dehydration.      02/14/2014 Imaging    PET/CT scan showed complete response to treatment      03/19/2014 Surgery    He had excisional lymph node biopsy from the right neck. Pathology was benign      05/13/2014 Surgery    He had removal of Port-A-Cath.      05/22/2014 Imaging    Repeat CT scan of the neck show no evidence of disease recurrence.  12/09/2014 Imaging    Ct neck without contrast showed persistent abnormalities on the right side of neck, indeterminate      12/25/2014 Imaging    PET CT scan showed disease recurrence.      02/10/2015 Procedure    He has placement of port      02/13/2015 - 07/13/2015 Chemotherapy    He received chemotherapy with carbo/Taxol      04/21/2015 Imaging    PET CT scan showed improved disease control      08/04/2015 Imaging    PET scan showed persistent disease      08/17/2015 -  Chemotherapy    He was started with  [facility]      10/19/2015 Imaging    Ct neck showed mass-like intermediate density soft tissue at the right lateral neck recurrence site stable.       02/26/2016 Imaging    CT neck showed unchanged appearance of right neck recurrence compared to 10/19/2015 CT. No noncontrast evidence of new metastatic disease in the neck. Chronic sinusitis, progressed.      02/26/2016 Imaging    Diffuse but patchy and asymmetric partial airspace filling process (ground-glass opacity) in the lungs. This could be due to respiratory bronchiolitis, hypersensitivity pneumonitis or possible drug reaction. Atypical/viral pneumonia is also possible. Pulmonary consultation may be a helpful. A three-month follow-up noncontrast chest CT is suggested. Slight interval enlargement of mediastinal lymph nodes and a small lymph node along the left major fissure. This is most likely due to the inflammatory process in the lungs. No findings for metastatic disease involving the chest. No findings for upper abdominal metastatic disease.      02/29/2016 Adverse Reaction    His treatment is placed on hold due to possible hypersensitivity pneumonitis/drug reaction      04/14/2016 Imaging    Ct chest showed no evidence for metastatic disease within the chest. Significant interval improvement and near complete resolution of previously described diffuse bilateral predominately ground-glass pulmonary opacities, most compatible with resolving infectious/inflammatory process.      09/05/2016 Imaging    CT scan of neck and chest  1. Unchanged appearance of right neck recurrence. 2. No evidence of new metastatic disease in the neck. 3. Unremarkable and stable CT appearance of the chest. No findings suspicious for metastatic disease      07/24/2017 Imaging    1. No findings suspicious for metastatic disease in the chest. 2. No acute consolidative airspace disease to suggest a pneumonia. 3. No appreciable change in chronic mild patchy  upper lung predominant centrilobular micronodularity. If the patient is a current smoker, these findings are most compatible with smoking related interstitial lung disease. If the patient is not a current smoker, these findings suggest subacute hypersensitivity pneumonitis  or postinflammatory change. 4. Stable mild biapical radiation fibrosis. 5. Mild to moderate centrilobular emphysema with diffuse bronchial wall thickening, suggesting COPD. 6. One vessel coronary atherosclerosis.  Aortic Atherosclerosis (ICD10-I70.0) and Emphysema (ICD10-J43.9).      09/15/2017 Imaging    1. No evidence of interstitial lung disease. 2. Emphysema (ICD10-J43.9).      10/29/2017 Imaging    1. Moderate quality exam for pulmonary embolism with primary limitation of motion. No embolism identified. 2. Findings of congestive heart failure, including bilateral pleural effusions and septal thickening. 3. New thoracic adenopathy since approximately 6 weeks ago. Favor secondary to fluid overload/congestive heart failure. 4. New left apical 4 mm pulmonary nodule. Favored to represent a subpleural lymph node. Non-contrast chest CT can be  considered in 12 months, given risk factors for primary bronchogenic carcinoma. This recommendation follows the consensus statement: Guidelines for Management of Incidental Pulmonary Nodules Detected on CT Images: From the Fleischner Society 2017; Radiology 2017; 403:474-259.       10/29/2017 - 11/02/2017 Hospital Admission    He was admitted to the hospital due to shortness of breath and was found to have congestive heart failure.      10/30/2017 Imaging    Definity used; severe global reduction in LV systolic function; severe LVE; restrictive filling; mild MR; mild LAE; mild RVE with moderate RV dysfunction; mild TR with moderate pulmonary hypertension.       REVIEW OF SYSTEMS:   Constitutional: Denies fevers, chills or abnormal weight loss Eyes: Denies blurriness of  vision Ears, nose, mouth, throat, and face: Denies mucositis or sore throat Respiratory: Denies cough, dyspnea or wheezes Cardiovascular: Denies palpitation, chest discomfort or lower extremity swelling Gastrointestinal:  Denies nausea, heartburn or change in bowel habits Skin: Denies abnormal skin rashes Lymphatics: Denies new lymphadenopathy or easy bruising Neurological:Denies numbness, tingling or new weaknesses Behavioral/Psych: Mood is stable, no new changes  All other systems were reviewed with the patient and are negative.  I have reviewed the past medical history, past surgical history, social history and family history with the patient and they are unchanged from previous note.  ALLERGIES:  is allergic to pollen extract.  MEDICATIONS:  Current Outpatient Medications  Medication Sig Dispense Refill  . aspirin EC 81 MG tablet Take 81 mg by mouth daily.    Marland Kitchen albuterol (PROVENTIL HFA;VENTOLIN HFA) 108 (90 Base) MCG/ACT inhaler Inhale 2 puffs into the lungs every 6 (six) hours as needed for wheezing or shortness of breath. 1 Inhaler 6  . carvedilol (COREG) 3.125 MG tablet Take 1 tablet (3.125 mg total) by mouth 2 (two) times daily with a meal. 60 tablet 1  . fluticasone (FLONASE) 50 MCG/ACT nasal spray Place 2 sprays into both nostrils daily. 16 g 2  . hydrALAZINE (APRESOLINE) 25 MG tablet Take 1 tablet (25 mg total) by mouth every 8 (eight) hours. 90 tablet 1  . isosorbide mononitrate (IMDUR) 30 MG 24 hr tablet Take 0.5 tablets (15 mg total) by mouth daily. 30 tablet 1  . levothyroxine (SYNTHROID, LEVOTHROID) 25 MCG tablet TAKE 1 TABLET BY MOUTH DAILY BEFORE BREAKFAST. 90 tablet 1  . lidocaine-prilocaine (EMLA) cream Apply 1 application topically as needed. 30 g 6  . methocarbamol (ROBAXIN) 500 MG tablet Take 1 tablet (500 mg total) by mouth every 8 (eight) hours as needed for muscle spasms. 30 tablet 0  . nitroGLYCERIN (NITROGLYN) 2 % ointment Apply topically as directed. Apply 0.5  inches topically every 8 hours    . oxyCODONE (ROXICODONE) 15 MG immediate release tablet Take 1 tablet (15 mg total) by mouth every 8 (eight) hours as needed. 90 tablet 0   No current facility-administered medications for this visit.    Facility-Administered Medications Ordered in Other Visits  Medication Dose Route Frequency Provider Last Rate Last Dose  . 0.9 %  sodium chloride infusion   Intravenous Once Alvy Bimler, Lamon Rotundo, MD      . heparin lock flush 100 unit/mL  500 Units Intracatheter Once PRN Alvy Bimler, Burnis Halling, MD      . pembrolizumab (KEYTRUDA) 200 mg in sodium chloride 0.9 % 50 mL chemo infusion  200 mg Intravenous Once Coraline Talwar, MD      . sodium chloride 0.9 % injection 10 mL  10 mL Intravenous PRN  [name], MD   10 mL at 11/29/16 0818  . sodium chloride flush (NS) 0.9 % injection 10 mL  10 mL Intracatheter PRN Alvy Bimler, Oris Calmes, MD        PHYSICAL EXAMINATION: ECOG PERFORMANCE STATUS: 1 - Symptomatic but completely ambulatory  Vitals:   11/07/17 1244  BP: (!) 110/92  Pulse: 93  Resp: 18  Temp: 98.6 F (37 C)  SpO2: 100%   Filed Weights   11/07/17 1244  Weight: 268 lb 3.2 oz (121.7 kg)    GENERAL:alert, no distress and comfortable SKIN: skin color, texture, turgor are normal, no rashes or significant lesions EYES: normal, Conjunctiva are pink and non-injected, sclera clear OROPHARYNX:no exudate, no erythema and lips, buccal mucosa, and tongue normal  NECK: supple, thyroid normal size, non-tender, without nodularity LYMPH:  no palpable lymphadenopathy in the cervical, axillary or inguinal LUNGS: clear to auscultation and percussion with normal breathing effort HEART: regular rate & rhythm and no murmurs and no lower extremity edema ABDOMEN:abdomen soft, non-tender and normal bowel sounds Musculoskeletal:no cyanosis of digits and no clubbing  NEURO: alert & oriented x 3 with fluent speech, no focal motor/sensory deficits  LABORATORY DATA:  I have reviewed the data as listed     Component Value Date/Time   NA 140 11/07/2017 1151   NA 141 04/20/2017 1028   K 4.3 11/07/2017 1151   K 4.3 04/20/2017 1028   CL 107 11/07/2017 1151   CO2 29 11/07/2017 1151   CO2 26 04/20/2017 1028   GLUCOSE 110 (H) 11/07/2017 1151   GLUCOSE 96 04/20/2017 1028   BUN 17 11/07/2017 1151   BUN 16.7 04/20/2017 1028   CREATININE 1.75 (H) 11/07/2017 1151   CREATININE 2.0 (H) 04/20/2017 1028   CALCIUM 9.1 11/07/2017 1151   CALCIUM 9.0 04/20/2017 1028   PROT 6.5 11/07/2017 1151   PROT 7.0 04/20/2017 1028   ALBUMIN 3.5 11/07/2017 1151   ALBUMIN 4.0 04/20/2017 1028   AST 21 11/07/2017 1151   AST 20 04/20/2017 1028   ALT 27 11/07/2017 1151   ALT 17 04/20/2017 1028   ALKPHOS 68 11/07/2017 1151   ALKPHOS 61 04/20/2017 1028   BILITOT 0.6 11/07/2017 1151   BILITOT 0.49 04/20/2017 1028   GFRNONAA 44 (L) 11/07/2017 1151   GFRAA 51 (L) 11/07/2017 1151    No results found for: SPEP, UPEP  Lab Results  Component Value Date   WBC 7.4 11/07/2017   NEUTROABS 6.3 11/07/2017   HGB 12.5 (L) 11/07/2017   HCT 39.0 11/07/2017   MCV 91.1 11/07/2017   PLT 264 11/07/2017      Chemistry      Component Value Date/Time   NA 140 11/07/2017 1151   NA 141 04/20/2017 1028   K 4.3 11/07/2017 1151   K 4.3 04/20/2017 1028   CL 107 11/07/2017 1151   CO2 29 11/07/2017 1151   CO2 26 04/20/2017 1028   BUN 17 11/07/2017 1151   BUN 16.7 04/20/2017 1028   CREATININE 1.75 (H) 11/07/2017 1151   CREATININE 2.0 (H) 04/20/2017 1028      Component Value Date/Time   CALCIUM 9.1 11/07/2017 1151   CALCIUM 9.0 04/20/2017 1028   ALKPHOS 68 11/07/2017 1151   ALKPHOS 61 04/20/2017 1028   AST 21 11/07/2017 1151   AST 20 04/20/2017 1028   ALT 27 11/07/2017 1151   ALT 17 04/20/2017 1028   BILITOT 0.6 11/07/2017 1151   BILITOT 0.49 04/20/2017 1028       RADIOGRAPHIC  STUDIES: I have personally reviewed the radiological images as listed and agreed with the findings in the report. Dg Chest 2  View  Result Date: 10/29/2017 CLINICAL DATA:  Chest pain and shortness of breath with head neck cancer. EXAM: CHEST - 2 VIEW COMPARISON:  09/15/2017 CT.  07/13/2017 plain film. FINDINGS: Moderate lower thoracic spondylosis. Right Port-A-Cath which terminates at the low SVC. Midline trachea. Borderline cardiomegaly. Mediastinal contours otherwise within normal limits. No pleural effusion or pneumothorax. Diffuse peribronchial thickening. No lobar consolidation. IMPRESSION: No acute cardiopulmonary disease. Peribronchial thickening which may relate to chronic bronchitis or smoking. Electronically Signed   By: Abigail Miyamoto M.D.   On: 10/29/2017 20:23   Ct Angio Chest Pe W/cm &/or Wo Cm  Result Date: 10/29/2017 CLINICAL DATA:  Shortness of breath and leg swelling for several days. Dizziness. EXAM: CT ANGIOGRAPHY CHEST WITH CONTRAST TECHNIQUE: Multidetector CT imaging of the chest was performed using the standard protocol during bolus administration of intravenous contrast. Multiplanar CT image reconstructions and MIPs were obtained to evaluate the vascular anatomy. CONTRAST:  64mL ISOVUE-370 IOPAMIDOL (ISOVUE-370) INJECTION 76% COMPARISON:  Plain films of earlier today.  CT 09/15/2017. FINDINGS: Cardiovascular: The quality of this exam for evaluation of pulmonary embolism is moderate. The bolus is well timed. The primary limitation is motion. No evidence of pulmonary embolism. A right-sided Port-A-Cath terminates at the superior caval/atrial junction. Mild cardiomegaly, without pericardial effusion. The aorta is normal in caliber but not well opacified secondary to bolus timing. Mediastinum/Nodes: Precarinal node measures 1.3 cm and is newly enlarged. A node within the azygoesophageal recess is newly enlarged at 1.9 cm on 51/5. Right hilar adenopathy at 1.9 cm. Prevascular node of 10 mm is enlarged from 7 mm on the prior. Lungs/Pleura: Small pleural effusions, larger on the right. Mild to moderate motion  degradation, most significant inferiorly. Mild centrilobular emphysema with superimposed mild interstitial edema, as evidenced by areas of heterogeneous ground-glass opacity and smooth septal thickening. A vague left apical 4 mm nodule on 45/11 is new since the prior, favored to represent a subpleural lymph node. Upper Abdomen: Contrast reflux into the hepatic veins and IVC, suggesting elevated right heart pressures. Motion degradation continuing into the upper abdomen. Normal imaged portions of the liver, spleen, stomach, pancreas, gallbladder, biliary tract, adrenal glands, kidneys. Musculoskeletal: Moderate thoracic spondylosis. Review of the MIP images confirms the above findings. IMPRESSION: 1. Moderate quality exam for pulmonary embolism with primary limitation of motion. No embolism identified. 2. Findings of congestive heart failure, including bilateral pleural effusions and septal thickening. 3. New thoracic adenopathy since approximately 6 weeks ago. Favor secondary to fluid overload/congestive heart failure. 4. New left apical 4 mm pulmonary nodule. Favored to represent a subpleural lymph node. Non-contrast chest CT can be considered in 12 months, given risk factors for primary bronchogenic carcinoma. This recommendation follows the consensus statement: Guidelines for Management of Incidental Pulmonary Nodules Detected on CT Images: From the Fleischner Society 2017; Radiology 2017; 284:228-243. Electronically Signed   By: Abigail Miyamoto M.D.   On: 10/29/2017 22:31   US Abdomen Complete  Result Date: 10/31/2017 CLINICAL DATA:  Elevated liver function tests. History of tonsillar carcinoma. EXAM: ABDOMEN ULTRASOUND COMPLETE COMPARISON:  CT abdomen and pelvis 07/23/2015. FINDINGS: Gallbladder: No gallstones or wall thickening visualized. No sonographic Murphy sign noted by sonographer. Common bile duct: Diameter: 0.5 cm Liver: No focal lesion identified. Within normal limits in parenchymal echogenicity.  Portal vein is patent on color Doppler imaging with normal direction of blood flow  towards the liver. IVC: No abnormality visualized. Pancreas: Visualized portion unremarkable. Spleen: Size and appearance within normal limits. Right Kidney: Length: 10.8 cm. Echogenicity within normal limits. No mass or hydronephrosis visualized. Left Kidney: Length: 11.2 cm. Echogenicity within normal limits. No mass or hydronephrosis visualized. Abdominal aorta: No aneurysm visualized. Other findings: Small right pleural effusion is noted. IMPRESSION: Small right pleural effusion. The examination is otherwise negative. Electronically Signed   By: Inge Rise M.D.   On: 10/31/2017 10:38    All questions were answered. The patient knows to call the clinic with any problems, questions or concerns. No barriers to learning was detected.  I spent 15 minutes counseling the patient face to face. The total time spent in the appointment was 20 minutes and more than 50% was on counseling and review of test results  [name], MD 11/07/2017 1:42 PM

## 2017-11-07 NOTE — Patient Instructions (Signed)
Your physician recommends that you continue on your current medications as directed. Please refer to the Current Medication list given to you today. Your physician recommends that you schedule a follow-up appointment in: See below.

## 2017-11-07 NOTE — Assessment & Plan Note (Signed)
He was recently diagnosed with congestive heart failure secondary to unknown cause of cardiomyopathy He is currently on medical management I will defer to cardiologist for that The immunotherapy is not the cause of this

## 2017-11-07 NOTE — Telephone Encounter (Signed)
"  I'm at my cardiologist office.  They say I'm scheduled today at the [facility].  I was just discharged from the hospital last Thursday.  Am I supposed to be there today?"  Yes.  Arrive at 11:45 am for registration.  Scheduled for lab, provider and treatment today.

## 2017-11-07 NOTE — Assessment & Plan Note (Signed)
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

## 2017-11-08 ENCOUNTER — Ambulatory Visit: Payer: Self-pay | Admitting: Physician Assistant

## 2017-11-08 ENCOUNTER — Telehealth: Payer: Self-pay | Admitting: Hematology and Oncology

## 2017-11-08 NOTE — Telephone Encounter (Signed)
Spoke to patient regarding upcoming aug appts.  °

## 2017-11-09 LAB — SPECIMEN STATUS REPORT

## 2017-11-24 ENCOUNTER — Encounter: Payer: Self-pay | Admitting: Internal Medicine

## 2017-11-27 ENCOUNTER — Ambulatory Visit: Payer: Medicaid Other | Admitting: Internal Medicine

## 2017-11-27 ENCOUNTER — Encounter: Payer: Self-pay | Admitting: Internal Medicine

## 2017-11-27 VITALS — BP 112/84 | HR 82 | Ht 79.0 in | Wt 271.4 lb

## 2017-11-27 DIAGNOSIS — N182 Chronic kidney disease, stage 2 (mild): Secondary | ICD-10-CM | POA: Diagnosis not present

## 2017-11-27 DIAGNOSIS — I5043 Acute on chronic combined systolic (congestive) and diastolic (congestive) heart failure: Secondary | ICD-10-CM | POA: Diagnosis not present

## 2017-11-27 MED ORDER — POTASSIUM CHLORIDE ER 10 MEQ PO TBCR: 10.0000 meq | EXTENDED_RELEASE_TABLET | Freq: Every day | ORAL | 6 refills | Status: DC

## 2017-11-27 MED ORDER — FUROSEMIDE 40 MG PO TABS: 40.0000 mg | ORAL_TABLET | Freq: Every day | ORAL | 6 refills | Status: DC

## 2017-11-27 MED FILL — POTASSIUM CHL ER M10 TABLET: 10 | 30 days supply | Qty: 30 | Fill #0

## 2017-11-27 MED FILL — ISOSORBIDE MN ER 30 MG TAB: 30 | 30 days supply | Qty: 15 | Fill #1

## 2017-11-27 MED FILL — FUROSEMIDE 40 MG TAB: 40 | 30 days supply | Qty: 30 | Fill #0

## 2017-11-27 NOTE — Patient Instructions (Signed)
Start Lasix 40 mg daily with 10 KCL Weigh yourself daily Watch salt intake  Call back on Friday with weights and how feeling or use MYChart to email in    Get labs next Monday at [facility]

## 2017-11-27 NOTE — Progress Notes (Signed)
Cardiology Office Note   Date:  11/27/2017   ID:  JEREMAIH KLIMA, DOB 25-Dec-1965, MRN [id number]  PCP:  Patient, [name]  Cardiologist:   [name], MD   Pt presents for f/u of CHF     History of Present Illness: Mason [name] is a 52 y.o. male who was admitted to [facility] in July with SOB and dizziness  Echo showed severe LV dysfunction   LVEF 15%  RVEF mod reduced     EKG without Q waves   PT without history of CP  He was diuresed    Placed on medical Rx    I saw him for first post op visit a few wks ago     Since then he has gone back to working full time   [employer]    He admits that with going back to work he has had to eat out more   Difficlt to follow low Na diet He also says that he has noticed some increased SOB and some minimal LE edema   He is no where close P feeling like he did prior to recent admit    Current Meds  Medication Sig  . albuterol (PROVENTIL HFA;VENTOLIN HFA) 108 (90 Base) MCG/ACT inhaler Inhale 2 puffs into the lungs every 6 (six) hours as needed for wheezing or shortness of breath.  Marland Kitchen aspirin EC 81 MG tablet Take 81 mg by mouth daily.  . carvedilol (COREG) 3.125 MG tablet Take 1 tablet (3.125 mg total) by mouth 2 (two) times daily with a meal.  . fluticasone (FLONASE) 50 MCG/ACT nasal spray Place 2 sprays into both nostrils daily.  . hydrALAZINE (APRESOLINE) 25 MG tablet Take 1 tablet (25 mg total) by mouth every 8 (eight) hours.  . isosorbide mononitrate (IMDUR) 30 MG 24 hr tablet Take 0.5 tablets (15 mg total) by mouth daily.  Marland Kitchen levothyroxine (SYNTHROID, LEVOTHROID) 25 MCG tablet TAKE 1 TABLET BY MOUTH DAILY BEFORE BREAKFAST.  Marland Kitchen lidocaine-prilocaine (EMLA) cream Apply 1 application topically as needed.  . methocarbamol (ROBAXIN) 500 MG tablet Take 1 tablet (500 mg total) by mouth every 8 (eight) hours as needed for muscle spasms.  . nitroGLYCERIN (NITROGLYN) 2 % ointment Apply topically as directed. Apply 0.5 inches topically every  8 hours  . oxyCODONE (ROXICODONE) 15 MG immediate release tablet Take 1 tablet (15 mg total) by mouth every 8 (eight) hours as needed.     Allergies:   Bee pollen and Pollen extract   Past Medical History:  Diagnosis Date  . Abnormal liver function test   . Acute sinusitis, unspecified 05/18/2015  . Anemia   . Anxiety    mild new dx  . Arthritis    knees,hips  . Bilateral edema of lower extremity   . Complication of anesthesia    Pt stated " my oxygen level was slow in rising."  . Concussion    Hx: in high school x 2  . Constipation   . Dysuria 09/04/2015  . Fever   . Hyperactive gag reflex   . Hypertension   . Hypoglycemia   . Hypokalemia   . Insomnia 06/15/2015  . Knee pain, chronic   . Malnutrition ([place])   . Non-healing surgical wound 05/23/2014  . PEG (percutaneous endoscopic gastrostomy) status ([place])   . Renal failure, acute ([place])   . S/P radiation therapy 08/19/2013-10/15/2013   Right Tonstil and bilateral neck / 70 Gy in 35 fractions to gross disease, 63  Gy in 35 fractions to high risk nodal echelons, and 56 Gy in 35 fractions to intermediate risk nodal echelons  . Severe nausea and vomiting   . Status post chemotherapy    Only received 2 doses due to uncontrolled nausea and acute renal failure  . 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 LAPAROSCOPIC GASTROSTOMY N/A 08/15/2013   Procedure: LAPAROSCOPIC GASTROSTOMY TUBE PLACEMENT;  Surgeon: [name], MD;  Location: [facility];  Service: General;  Laterality: N/A;  . LYMPH NODE BIOPSY  03/20/14   right neck  . MULTIPLE EXTRACTIONS WITH ALVEOLOPLASTY N/A 08/01/2013   Procedure: Extraction of tooth #'s 1,15,17,31, 32 with alveoloplasty, mandibular left torus reduction, and gross debridement of remaining teeth.;  Surgeon: [name], DDS;  Location: [facility];  Service: Oral Surgery;  Laterality: N/A;  . PORT-A-CATH REMOVAL N/A  05/13/2014   Procedure: REMOVAL of PORT-A-CATH;  Surgeon: [name], MD;  Location: [facility];  Service: General;  Laterality: N/A;  . PORTACATH PLACEMENT Left 08/15/2013   Procedure: INSERTION PORT-A-CATH;  Surgeon: [name], MD;  Location: [place] [facility];  Service: General;  Laterality: Left;     Social History:  The patient  reports that he quit smoking about 14 years ago. His smoking use included cigarettes. He has a 20.00 pack-year smoking history. He has never used smokeless tobacco. He reports that he has current or past drug history. Drugs: Marijuana and Benzodiazepines. He reports that he does not drink alcohol.   Family History:  The patient's family history includes Arthritis in his father and mother; CVA in his maternal grandmother and unknown relative; Diabetes in his unknown relative; Heart attack in his unknown relative; Heart disease in his maternal grandmother and mother.    ROS:  Please see the history of present illness. All other systems are reviewed and  Negative to the above problem except as noted.    PHYSICAL EXAM: VS:  BP 112/84 (BP Location: Left Arm, Patient Position: Sitting, Cuff Size: Large)   Pulse 82   Ht 6\' 7"  (2.007 m)   Wt 271 lb 6.4 oz (123.1 kg)   SpO2 98%   BMI 30.57 kg/m   GEN: Well nourished, well developed, in no acute distress  HEENT: normal  Neck: JVP is mildly elevated  No, carotid bruits, or masses Cardiac: RRR; no murmurs, rubs, or gallops,Trivial LE edema  Respiratory:  clear to auscultation bilaterally, normal work of breathing GI: soft, nontender, nondistended, + BS  No hepatomegaly  MS: no deformity Moving all extremities   Skin: warm and dry, no rash Neuro:  Strength and sensation are intact Psych: euthymic mood, full affect   EKG:  EKG is not ordered today.   Lipid Panel No results found for: CHOL, TRIG, HDL, CHOLHDL, VLDL, LDLCALC, LDLDIRECT    Wt Readings from Last 3 Encounters:  11/27/17 271 lb 6.4 oz (123.1 kg)    11/07/17 268 lb 3.2 oz (121.7 kg)  11/07/17 267 lb 1.9 oz (121.2 kg)      ASSESSMENT AND PLAN:  1  Acute on chronic systolic CHF   Etiology unclear   ? If related to chemotherapy   Today is volume is mildly increased on exam   Eating out more   I would recomm adding lasix and K to regimen   Check labs when he has them done through port   Watch NA   Follow weights  Continue carvedilol and hydralazine/imdur    Will need to review with oncology.   Pt will need repeat echo this fall to reeval LVEF   2  Tonsillar CA    Follows in cancer center with Dr Alvy Bimler SInce seen hin clinic he has received another infusion     3  Dizziness   Pt denies significant on current regimen  4   CKD   Follow renal function on lasix therapy  Cr has been 1.63-1.84     Current medicines are reviewed at length with the patient today.  The patient does not have concerns regarding medicines.  Signed, [name], MD  11/27/2017 10:53 AM    [address], [place], [place]  [address] Phone: [phone]; Fax: [phone]

## 2017-12-04 ENCOUNTER — Inpatient Hospital Stay: Payer: Medicaid Other

## 2017-12-04 ENCOUNTER — Encounter: Payer: Self-pay | Admitting: Hematology and Oncology

## 2017-12-04 ENCOUNTER — Inpatient Hospital Stay: Payer: Medicaid Other | Attending: Hematology and Oncology

## 2017-12-04 ENCOUNTER — Inpatient Hospital Stay (HOSPITAL_BASED_OUTPATIENT_CLINIC_OR_DEPARTMENT_OTHER): Payer: Medicaid Other | Admitting: Hematology and Oncology

## 2017-12-04 DIAGNOSIS — I429 Cardiomyopathy, unspecified: Secondary | ICD-10-CM

## 2017-12-04 DIAGNOSIS — C099 Malignant neoplasm of tonsil, unspecified: Secondary | ICD-10-CM | POA: Insufficient documentation

## 2017-12-04 DIAGNOSIS — I509 Heart failure, unspecified: Secondary | ICD-10-CM | POA: Diagnosis not present

## 2017-12-04 DIAGNOSIS — G893 Neoplasm related pain (acute) (chronic): Secondary | ICD-10-CM | POA: Diagnosis not present

## 2017-12-04 DIAGNOSIS — I5021 Acute systolic (congestive) heart failure: Secondary | ICD-10-CM

## 2017-12-04 DIAGNOSIS — C770 Secondary and unspecified malignant neoplasm of lymph nodes of head, face and neck: Secondary | ICD-10-CM

## 2017-12-04 DIAGNOSIS — N182 Chronic kidney disease, stage 2 (mild): Secondary | ICD-10-CM | POA: Insufficient documentation

## 2017-12-04 DIAGNOSIS — Z5112 Encounter for antineoplastic immunotherapy: Secondary | ICD-10-CM | POA: Insufficient documentation

## 2017-12-04 DIAGNOSIS — E039 Hypothyroidism, unspecified: Secondary | ICD-10-CM

## 2017-12-04 DIAGNOSIS — C090 Malignant neoplasm of tonsillar fossa: Secondary | ICD-10-CM

## 2017-12-04 LAB — COMPREHENSIVE METABOLIC PANEL
ALBUMIN: 3.9 g/dL (ref 3.5–5.0)
ALK PHOS: 67 U/L (ref 38–126)
ALT: 25 U/L (ref 0–44)
ANION GAP: 9 (ref 5–15)
AST: 23 U/L (ref 15–41)
BUN: 18 mg/dL (ref 6–20)
CALCIUM: 9.2 mg/dL (ref 8.9–10.3)
CHLORIDE: 103 mmol/L (ref 98–111)
CO2: 29 mmol/L (ref 22–32)
Creatinine, Ser: 1.97 mg/dL — ABNORMAL HIGH (ref 0.61–1.24)
GFR calc non Af Amer: 38 mL/min — ABNORMAL LOW (ref >60)
GFR, EST AFRICAN AMERICAN: 44 mL/min — AB (ref >60)
GLUCOSE: 92 mg/dL (ref 70–99)
Potassium: 4.3 mmol/L (ref 3.5–5.1)
SODIUM: 141 mmol/L (ref 135–145)
Total Bilirubin: 0.6 mg/dL (ref 0.3–1.2)
Total Protein: 7 g/dL (ref 6.5–8.1)

## 2017-12-04 LAB — CBC WITH DIFFERENTIAL/PLATELET
BASOS PCT: 0 %
Basophils Absolute: 0 10*3/uL (ref 0.0–0.1)
Eosinophils Absolute: 0.2 10*3/uL (ref 0.0–0.5)
Eosinophils Relative: 3 %
HEMATOCRIT: 44.8 % (ref 38.4–49.9)
Hemoglobin: 14 g/dL (ref 13.0–17.1)
LYMPHS ABS: 1 10*3/uL (ref 0.9–3.3)
Lymphocytes Relative: 15 %
MCH: 28.6 pg (ref 27.2–33.4)
MCHC: 31.3 g/dL — AB (ref 32.0–36.0)
MCV: 91.6 fL (ref 79.3–98.0)
MONO ABS: 0.5 10*3/uL (ref 0.1–0.9)
MONOS PCT: 7 %
NEUTROS ABS: 4.9 10*3/uL (ref 1.5–6.5)
NEUTROS PCT: 75 %
Platelets: 231 10*3/uL (ref 140–400)
RBC: 4.89 MIL/uL (ref 4.20–5.82)
RDW: 14.6 % (ref 11.0–14.6)
WBC: 6.5 10*3/uL (ref 4.0–10.3)

## 2017-12-04 LAB — TSH: TSH: 1.925 u[IU]/mL (ref 0.320–4.118)

## 2017-12-04 MED ORDER — SODIUM CHLORIDE 0.9 % IJ SOLN
10.0000 mL | INTRAMUSCULAR | Status: DC | PRN
Start: 2017-12-04 — End: 2017-12-04
  Administered 2017-12-04: 10 mL via INTRAVENOUS
  Filled 2017-12-04: qty 10

## 2017-12-04 MED ORDER — SODIUM CHLORIDE 0.9% FLUSH
10.0000 mL | INTRAVENOUS | Status: DC | PRN
  Administered 2017-12-04: 10 mL
  Filled 2017-12-04: qty 10

## 2017-12-04 MED ORDER — SODIUM CHLORIDE 0.9 % IV SOLN
Freq: Once | INTRAVENOUS | Status: AC
  Administered 2017-12-04: 10:00:00 via INTRAVENOUS
  Filled 2017-12-04: qty 250

## 2017-12-04 MED ORDER — SODIUM CHLORIDE 0.9 % IV SOLN
200.0000 mg | Freq: Once | INTRAVENOUS | Status: AC
  Administered 2017-12-04: 200 mg via INTRAVENOUS
  Filled 2017-12-04: qty 8

## 2017-12-04 MED ORDER — OXYCODONE HCL 15 MG PO TABS: 15.0000 mg | ORAL_TABLET | Freq: Three times a day (TID) | ORAL | 0 refills | Status: DC | PRN

## 2017-12-04 MED ORDER — HEPARIN SOD (PORK) LOCK FLUSH 100 UNIT/ML IV SOLN
500.0000 [IU] | Freq: Once | INTRAVENOUS | Status: AC | PRN
  Administered 2017-12-04: 500 [IU]
  Filled 2017-12-04: qty 5

## 2017-12-04 MED FILL — hydrALAZINE HCL 25 MG TABS: 25 | 30 days supply | Qty: 90 | Fill #1

## 2017-12-04 MED FILL — CARVEDILOL 3.125 MG TABLET: 3.125 | 30 days supply | Qty: 60 | Fill #1

## 2017-12-04 MED FILL — oxyCODONE HCL 15 MG TABS: 15 | 30 days supply | Qty: 90 | Fill #0

## 2017-12-04 NOTE — Assessment & Plan Note (Signed)
Clinically, I could not detect any signs of persistent cancer Recent CT scan of the chest is stable Nevertheless, he is at risk of recurrence We discussed the risk and benefits of maintenance treatment and he agreed to proceed I plan to repeat imaging studies again in October

## 2017-12-04 NOTE — Assessment & Plan Note (Signed)
He was recently diagnosed with congestive heart failure secondary to unknown cause of cardiomyopathy He is currently on medical management I will defer to cardiologist for that The immunotherapy is not the cause of this Clinically, he has no signs or symptoms of congestive heart failure

## 2017-12-04 NOTE — Assessment & Plan Note (Signed)
He has persistent chronic jaw pain from his prior cancer treatment I have refilled his prescription pain medicine today We will continue supportive care

## 2017-12-04 NOTE — Patient Instructions (Signed)
[facility] Discharge Instructions for Patients Receiving Chemotherapy  Today you received the following chemotherapy agents:  Keytruda (pembrolizumab)  To help prevent nausea and vomiting after your treatment, we encourage you to take your nausea medication as prescribed.   If you develop nausea and vomiting that is not controlled by your nausea medication, call the clinic.   BELOW ARE SYMPTOMS THAT SHOULD BE REPORTED IMMEDIATELY:  *FEVER GREATER THAN 100.5 F  *CHILLS WITH OR WITHOUT FEVER  NAUSEA AND VOMITING THAT IS NOT CONTROLLED WITH YOUR NAUSEA MEDICATION  *UNUSUAL SHORTNESS OF BREATH  *UNUSUAL BRUISING OR BLEEDING  TENDERNESS IN MOUTH AND THROAT WITH OR WITHOUT PRESENCE OF ULCERS  *URINARY PROBLEMS  *BOWEL PROBLEMS  UNUSUAL RASH Items with * indicate a potential emergency and should be followed up as soon as possible.  Feel free to call the clinic should you have any questions or concerns. The clinic phone number is [phone].  Please show the CHEMO ALERT CARD at check-in to the Emergency Department and triage nurse.   

## 2017-12-04 NOTE — Progress Notes (Signed)
[facility] OFFICE PROGRESS NOTE  Patient Care Team: Patient, [name] as PCP - General ([place]) Fay Records, MD as PCP - Cardiology (Cardiology) Patient, [name] (General Practice) [name], MD as Referring Physician (Otolaryngology) [name], MD as Consulting Physician (Hematology and Oncology) Patient, [name] (General Practice)  ASSESSMENT & PLAN:  Tonsillar cancer Clinically, I could not detect any signs of persistent cancer Recent CT scan of the chest is stable Nevertheless, he is at risk of recurrence We discussed the risk and benefits of maintenance treatment and he agreed to proceed I plan to repeat imaging studies again in October  Chronic kidney disease (CKD), stage II (mild) He has stable chronic kidney disease We will monitor closely and he will continue risk factor management  CHF (congestive heart failure) ([place]) He was recently diagnosed with congestive heart failure secondary to unknown cause of cardiomyopathy He is currently on medical management I will defer to cardiologist for that The immunotherapy is not the cause of this Clinically, he has no signs or symptoms of congestive heart failure  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   INTERVAL HISTORY: Please see below for problem oriented charting. He returns for further follow-up He feels well No recent cough, chest pain or shortness of breath He has chronic jaw pain, stable with current prescription pain medicine He saw cardiologist recently.  SUMMARY OF ONCOLOGIC HISTORY: Oncology History   Tonsillar cancer, HPV positive   Primary site: Pharynx - Oropharynx (Right)   Staging method: AJCC 7th Edition   Clinical: Stage IVA (T2, N2b, M0) signed by [name],  MD on 08/19/2013  1:24 PM   Summary: Stage IVA (T2, N2b, M0)       Tonsillar cancer ([place])   07/09/2013 Procedure    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 FNA of right level III lymph node was inconclusive for cancer    07/25/2013 Imaging    PET scan showed locally advanced disease with abnormal lymphadenopathy in the right axilla    08/06/2013 Procedure    CT-guided biopsy of the lymphadenopathy was negative for malignancy    08/15/2013 Surgery    Patient has placement of port and feeding tube    08/19/2013 - 09/10/2013 Chemotherapy    Patient received chemotherapy with cisplatin. The patient only received 2 doses due to uncontrolled nausea and acute renal failure.    08/19/2013 - 10/15/2013 Radiation Therapy    Received Helical IMRT Tomotherapy:  Right Tonstil and bilateral neck / 70 Gy in 35 fractions to gross disease, 63 Gy in 35 fractions to high risk nodal echelons, and 56 Gy in 35 fractions to intermediate risk nodal echelons.    08/27/2013 - 08/30/2013 Hospital Admission    The patient was admitted to the hospital for uncontrolled nausea vomiting and dehydration.    02/14/2014 Imaging    PET/CT scan showed complete response to treatment    03/19/2014 Surgery    He had excisional lymph node biopsy from the right neck. Pathology was benign    05/13/2014 Surgery    He had removal of Port-A-Cath.    05/22/2014 Imaging    Repeat CT scan of the neck show no evidence of disease recurrence.    12/09/2014 Imaging  Ct neck without contrast showed persistent abnormalities on the right side of neck, indeterminate    12/25/2014 Imaging    PET CT scan showed disease recurrence.    02/10/2015 Procedure    He has placement of port    02/13/2015 - 07/13/2015 Chemotherapy    He received chemotherapy with carbo/Taxol    04/21/2015 Imaging    PET CT scan showed improved disease control    08/04/2015 Imaging    PET scan showed persistent disease     08/17/2015 -  Chemotherapy    He was started with [facility]    10/19/2015 Imaging    Ct neck showed mass-like intermediate density soft tissue at the right lateral neck recurrence site stable.     02/26/2016 Imaging    CT neck showed unchanged appearance of right neck recurrence compared to 10/19/2015 CT. No noncontrast evidence of new metastatic disease in the neck. Chronic sinusitis, progressed.    02/26/2016 Imaging    Diffuse but patchy and asymmetric partial airspace filling process (ground-glass opacity) in the lungs. This could be due to respiratory bronchiolitis, hypersensitivity pneumonitis or possible drug reaction. Atypical/viral pneumonia is also possible. Pulmonary consultation may be a helpful. A three-month follow-up noncontrast chest CT is suggested. Slight interval enlargement of mediastinal lymph nodes and a small lymph node along the left major fissure. This is most likely due to the inflammatory process in the lungs. No findings for metastatic disease involving the chest. No findings for upper abdominal metastatic disease.    02/29/2016 Adverse Reaction    His treatment is placed on hold due to possible hypersensitivity pneumonitis/drug reaction    04/14/2016 Imaging    Ct chest showed no evidence for metastatic disease within the chest. Significant interval improvement and near complete resolution of previously described diffuse bilateral predominately ground-glass pulmonary opacities, most compatible with resolving infectious/inflammatory process.    09/05/2016 Imaging    CT scan of neck and chest  1. Unchanged appearance of right neck recurrence. 2. No evidence of new metastatic disease in the neck. 3. Unremarkable and stable CT appearance of the chest. No findings suspicious for metastatic disease    07/24/2017 Imaging    1. No findings suspicious for metastatic disease in the chest. 2. No acute consolidative airspace disease to suggest a pneumonia. 3. No appreciable change  in chronic mild patchy upper lung predominant centrilobular micronodularity. If the patient is a current smoker, these findings are most compatible with smoking related interstitial lung disease. If the patient is not a current smoker, these findings suggest subacute hypersensitivity pneumonitis  or postinflammatory change. 4. Stable mild biapical radiation fibrosis. 5. Mild to moderate centrilobular emphysema with diffuse bronchial wall thickening, suggesting COPD. 6. One vessel coronary atherosclerosis.  Aortic Atherosclerosis (ICD10-I70.0) and Emphysema (ICD10-J43.9).    09/15/2017 Imaging    1. No evidence of interstitial lung disease. 2. Emphysema (ICD10-J43.9).    10/29/2017 Imaging    1. Moderate quality exam for pulmonary embolism with primary limitation of motion. No embolism identified. 2. Findings of congestive heart failure, including bilateral pleural effusions and septal thickening. 3. New thoracic adenopathy since approximately 6 weeks ago. Favor secondary to fluid overload/congestive heart failure. 4. New left apical 4 mm pulmonary nodule. Favored to represent a subpleural lymph node. Non-contrast chest CT can be considered in 12 months, given risk factors for primary bronchogenic carcinoma. This recommendation follows the consensus statement: Guidelines for Management of Incidental Pulmonary Nodules Detected on CT Images: From the Fleischner Society 2017; Radiology 2017;  [id number].     10/29/2017 - 11/02/2017 Hospital Admission    He was admitted to the hospital due to shortness of breath and was found to have congestive heart failure.    10/30/2017 Imaging    Definity used; severe global reduction in LV systolic function; severe LVE; restrictive filling; mild MR; mild LAE; mild RVE with moderate RV dysfunction; mild TR with moderate pulmonary hypertension.     REVIEW OF SYSTEMS:   Constitutional: Denies fevers, chills or abnormal weight loss Eyes: Denies blurriness of  vision Ears, nose, mouth, throat, and face: Denies mucositis or sore throat Respiratory: Denies cough, dyspnea or wheezes Cardiovascular: Denies palpitation, chest discomfort or lower extremity swelling Gastrointestinal:  Denies nausea, heartburn or change in bowel habits Skin: Denies abnormal skin rashes Lymphatics: Denies new lymphadenopathy or easy bruising Neurological:Denies numbness, tingling or new weaknesses Behavioral/Psych: Mood is stable, no new changes  All other systems were reviewed with the patient and are negative.  I have reviewed the past medical history, past surgical history, social history and family history with the patient and they are unchanged from previous note.  ALLERGIES:  is allergic to bee pollen and pollen extract.  MEDICATIONS:  Current Outpatient Medications  Medication Sig Dispense Refill  . albuterol (PROVENTIL HFA;VENTOLIN HFA) 108 (90 Base) MCG/ACT inhaler Inhale 2 puffs into the lungs every 6 (six) hours as needed for wheezing or shortness of breath. 1 Inhaler 6  . aspirin EC 81 MG tablet Take 81 mg by mouth daily.    . carvedilol (COREG) 3.125 MG tablet Take 1 tablet (3.125 mg total) by mouth 2 (two) times daily with a meal. 60 tablet 1  . fluticasone (FLONASE) 50 MCG/ACT nasal spray Place 2 sprays into both nostrils daily. 16 g 2  . furosemide (LASIX) 40 MG tablet Take 1 tablet (40 mg total) by mouth daily. 30 tablet 6  . hydrALAZINE (APRESOLINE) 25 MG tablet Take 1 tablet (25 mg total) by mouth every 8 (eight) hours. 90 tablet 1  . isosorbide mononitrate (IMDUR) 30 MG 24 hr tablet Take 0.5 tablets (15 mg total) by mouth daily. 30 tablet 1  . levothyroxine (SYNTHROID, LEVOTHROID) 25 MCG tablet TAKE 1 TABLET BY MOUTH DAILY BEFORE BREAKFAST. 90 tablet 1  . lidocaine-prilocaine (EMLA) cream Apply 1 application topically as needed. 30 g 6  . methocarbamol (ROBAXIN) 500 MG tablet Take 1 tablet (500 mg total) by mouth every 8 (eight) hours as needed for  muscle spasms. 30 tablet 0  . nitroGLYCERIN (NITROGLYN) 2 % ointment Apply topically as directed. Apply 0.5 inches topically every 8 hours    . oxyCODONE (ROXICODONE) 15 MG immediate release tablet Take 1 tablet (15 mg total) by mouth every 8 (eight) hours as needed. 90 tablet 0  . potassium chloride (K-DUR) 10 MEQ tablet Take 1 tablet (10 mEq total) by mouth daily. 30 tablet 6   No current facility-administered medications for this visit.    Facility-Administered Medications Ordered in Other Visits  Medication Dose Route Frequency Provider Last Rate Last Dose  . sodium chloride 0.9 % injection 10 mL  10 mL Intravenous PRN Alvy Bimler, Feras Gardella, MD   10 mL at 11/29/16 0818    PHYSICAL EXAMINATION: ECOG PERFORMANCE STATUS: 0 - Asymptomatic  Vitals:   12/04/17 0854  BP: 123/86  Pulse: 82  Resp: 18  Temp: 98.4 F (36.9 C)  SpO2: 100%   Filed Weights   12/04/17 0854  Weight: 256 lb 12.8 oz (116.5 kg)    GENERAL:alert,  no distress and comfortable SKIN: skin color, texture, turgor are normal, no rashes or significant lesions EYES: normal, Conjunctiva are pink and non-injected, sclera clear OROPHARYNX:no exudate, no erythema and lips, buccal mucosa, and tongue normal  NECK: He has well-healed surgical scar LYMPH:  no palpable lymphadenopathy in the cervical, axillary or inguinal LUNGS: clear to auscultation and percussion with normal breathing effort HEART: regular rate & rhythm and no murmurs and no lower extremity edema ABDOMEN:abdomen soft, non-tender and normal bowel sounds Musculoskeletal:no cyanosis of digits and no clubbing  NEURO: alert & oriented x 3 with fluent speech, no focal motor/sensory deficits  LABORATORY DATA:  I have reviewed the data as listed    Component Value Date/Time   NA 141 12/04/2017 0817   NA 141 04/20/2017 1028   K 4.3 12/04/2017 0817   K 4.3 04/20/2017 1028   CL 103 12/04/2017 0817   CO2 29 12/04/2017 0817   CO2 26 04/20/2017 1028   GLUCOSE 92  12/04/2017 0817   GLUCOSE 96 04/20/2017 1028   BUN 18 12/04/2017 0817   BUN 16.7 04/20/2017 1028   CREATININE 1.97 (H) 12/04/2017 0817   CREATININE 2.0 (H) 04/20/2017 1028   CALCIUM 9.2 12/04/2017 0817   CALCIUM 9.0 04/20/2017 1028   PROT 7.0 12/04/2017 0817   PROT 7.0 04/20/2017 1028   ALBUMIN 3.9 12/04/2017 0817   ALBUMIN 4.0 04/20/2017 1028   AST 23 12/04/2017 0817   AST 20 04/20/2017 1028   ALT 25 12/04/2017 0817   ALT 17 04/20/2017 1028   ALKPHOS 67 12/04/2017 0817   ALKPHOS 61 04/20/2017 1028   BILITOT 0.6 12/04/2017 0817   BILITOT 0.49 04/20/2017 1028   GFRNONAA 38 (L) 12/04/2017 0817   GFRAA 44 (L) 12/04/2017 0817    No results found for: SPEP, UPEP  Lab Results  Component Value Date   WBC 6.5 12/04/2017   NEUTROABS 4.9 12/04/2017   HGB 14.0 12/04/2017   HCT 44.8 12/04/2017   MCV 91.6 12/04/2017   PLT 231 12/04/2017      Chemistry      Component Value Date/Time   NA 141 12/04/2017 0817   NA 141 04/20/2017 1028   K 4.3 12/04/2017 0817   K 4.3 04/20/2017 1028   CL 103 12/04/2017 0817   CO2 29 12/04/2017 0817   CO2 26 04/20/2017 1028   BUN 18 12/04/2017 0817   BUN 16.7 04/20/2017 1028   CREATININE 1.97 (H) 12/04/2017 0817   CREATININE 2.0 (H) 04/20/2017 1028      Component Value Date/Time   CALCIUM 9.2 12/04/2017 0817   CALCIUM 9.0 04/20/2017 1028   ALKPHOS 67 12/04/2017 0817   ALKPHOS 61 04/20/2017 1028   AST 23 12/04/2017 0817   AST 20 04/20/2017 1028   ALT 25 12/04/2017 0817   ALT 17 04/20/2017 1028   BILITOT 0.6 12/04/2017 0817   BILITOT 0.49 04/20/2017 1028      All questions were answered. The patient knows to call the clinic with any problems, questions or concerns. No barriers to learning was detected.  I spent 15 minutes counseling the patient face to face. The total time spent in the appointment was 20 minutes and more than 50% was on counseling and review of test results  [name], MD 12/04/2017 2:29 PM

## 2017-12-04 NOTE — Assessment & Plan Note (Signed)
He has stable chronic kidney disease We will monitor closely and he will continue risk factor management

## 2017-12-06 ENCOUNTER — Telehealth: Payer: Self-pay | Admitting: Hematology and Oncology

## 2017-12-06 NOTE — Telephone Encounter (Signed)
Left message for patient regarding upcoming sept and oct appts.

## 2017-12-29 ENCOUNTER — Other Ambulatory Visit: Payer: Self-pay | Admitting: *Deleted

## 2017-12-29 DIAGNOSIS — C099 Malignant neoplasm of tonsil, unspecified: Secondary | ICD-10-CM

## 2018-01-01 ENCOUNTER — Inpatient Hospital Stay (HOSPITAL_BASED_OUTPATIENT_CLINIC_OR_DEPARTMENT_OTHER): Payer: Medicaid Other | Admitting: Hematology and Oncology

## 2018-01-01 ENCOUNTER — Inpatient Hospital Stay: Payer: Medicaid Other

## 2018-01-01 ENCOUNTER — Inpatient Hospital Stay: Payer: Medicaid Other | Attending: Hematology and Oncology

## 2018-01-01 ENCOUNTER — Encounter: Payer: Self-pay | Admitting: Hematology and Oncology

## 2018-01-01 VITALS — BP 107/72 | HR 69 | Temp 98.2°F | Resp 18 | Ht 79.0 in | Wt 256.2 lb

## 2018-01-01 DIAGNOSIS — Z7982 Long term (current) use of aspirin: Secondary | ICD-10-CM | POA: Insufficient documentation

## 2018-01-01 DIAGNOSIS — N182 Chronic kidney disease, stage 2 (mild): Secondary | ICD-10-CM | POA: Diagnosis not present

## 2018-01-01 DIAGNOSIS — Z9221 Personal history of antineoplastic chemotherapy: Secondary | ICD-10-CM | POA: Insufficient documentation

## 2018-01-01 DIAGNOSIS — R6884 Jaw pain: Secondary | ICD-10-CM

## 2018-01-01 DIAGNOSIS — C770 Secondary and unspecified malignant neoplasm of lymph nodes of head, face and neck: Secondary | ICD-10-CM

## 2018-01-01 DIAGNOSIS — I509 Heart failure, unspecified: Secondary | ICD-10-CM | POA: Diagnosis not present

## 2018-01-01 DIAGNOSIS — C099 Malignant neoplasm of tonsil, unspecified: Secondary | ICD-10-CM

## 2018-01-01 DIAGNOSIS — I5021 Acute systolic (congestive) heart failure: Secondary | ICD-10-CM

## 2018-01-01 DIAGNOSIS — G893 Neoplasm related pain (acute) (chronic): Secondary | ICD-10-CM

## 2018-01-01 DIAGNOSIS — C090 Malignant neoplasm of tonsillar fossa: Secondary | ICD-10-CM

## 2018-01-01 DIAGNOSIS — Z923 Personal history of irradiation: Secondary | ICD-10-CM

## 2018-01-01 DIAGNOSIS — E039 Hypothyroidism, unspecified: Secondary | ICD-10-CM

## 2018-01-01 DIAGNOSIS — Z79899 Other long term (current) drug therapy: Secondary | ICD-10-CM

## 2018-01-01 DIAGNOSIS — J189 Pneumonia, unspecified organism: Secondary | ICD-10-CM

## 2018-01-01 DIAGNOSIS — Z5112 Encounter for antineoplastic immunotherapy: Secondary | ICD-10-CM | POA: Diagnosis present

## 2018-01-01 DIAGNOSIS — T50905A Adverse effect of unspecified drugs, medicaments and biological substances, initial encounter: Principal | ICD-10-CM

## 2018-01-01 LAB — CBC WITH DIFFERENTIAL/PLATELET
BASOS ABS: 0 10*3/uL (ref 0.0–0.1)
BASOS PCT: 1 %
EOS ABS: 0.2 10*3/uL (ref 0.0–0.5)
EOS PCT: 3 %
HCT: 41.7 % (ref 38.4–49.9)
Hemoglobin: 13.4 g/dL (ref 13.0–17.1)
Lymphocytes Relative: 6 %
Lymphs Abs: 0.4 10*3/uL — ABNORMAL LOW (ref 0.9–3.3)
MCH: 28.9 pg (ref 27.2–33.4)
MCHC: 32.1 g/dL (ref 32.0–36.0)
MCV: 89.8 fL (ref 79.3–98.0)
MONO ABS: 0.5 10*3/uL (ref 0.1–0.9)
Monocytes Relative: 8 %
NEUTROS ABS: 5.6 10*3/uL (ref 1.5–6.5)
Neutrophils Relative %: 82 %
PLATELETS: 186 10*3/uL (ref 140–400)
RBC: 4.65 MIL/uL (ref 4.20–5.82)
RDW: 16.1 % — AB (ref 11.0–14.6)
WBC: 6.7 10*3/uL (ref 4.0–10.3)

## 2018-01-01 LAB — COMPREHENSIVE METABOLIC PANEL
ALBUMIN: 3.7 g/dL (ref 3.5–5.0)
ALT: 10 U/L (ref 0–44)
ANION GAP: 9 (ref 5–15)
AST: 20 U/L (ref 15–41)
Alkaline Phosphatase: 67 U/L (ref 38–126)
BUN: 15 mg/dL (ref 6–20)
CHLORIDE: 107 mmol/L (ref 98–111)
CO2: 26 mmol/L (ref 22–32)
Calcium: 9.3 mg/dL (ref 8.9–10.3)
Creatinine, Ser: 1.82 mg/dL — ABNORMAL HIGH (ref 0.61–1.24)
GFR calc Af Amer: 48 mL/min — ABNORMAL LOW (ref >60)
GFR calc non Af Amer: 42 mL/min — ABNORMAL LOW (ref >60)
Glucose, Bld: 86 mg/dL (ref 70–99)
POTASSIUM: 4.1 mmol/L (ref 3.5–5.1)
Sodium: 142 mmol/L (ref 135–145)
TOTAL PROTEIN: 6.9 g/dL (ref 6.5–8.1)
Total Bilirubin: 0.5 mg/dL (ref 0.3–1.2)

## 2018-01-01 LAB — TSH: TSH: 3.056 u[IU]/mL (ref 0.320–4.118)

## 2018-01-01 MED ORDER — SODIUM CHLORIDE 0.9 % IV SOLN
200.0000 mg | Freq: Once | INTRAVENOUS | Status: AC
  Administered 2018-01-01: 200 mg via INTRAVENOUS
  Filled 2018-01-01: qty 8

## 2018-01-01 MED ORDER — SODIUM CHLORIDE 0.9 % IJ SOLN
10.0000 mL | Freq: Once | INTRAMUSCULAR | Status: AC
  Administered 2018-01-01: 10 mL
  Filled 2018-01-01: qty 10

## 2018-01-01 MED ORDER — SODIUM CHLORIDE 0.9% FLUSH
10.0000 mL | INTRAVENOUS | Status: DC | PRN
  Administered 2018-01-01: 10 mL
  Filled 2018-01-01: qty 10

## 2018-01-01 MED ORDER — SODIUM CHLORIDE 0.9 % IV SOLN
Freq: Once | INTRAVENOUS | Status: AC
  Administered 2018-01-01: 10:00:00 via INTRAVENOUS
  Filled 2018-01-01: qty 250

## 2018-01-01 MED ORDER — HEPARIN SOD (PORK) LOCK FLUSH 100 UNIT/ML IV SOLN
500.0000 [IU] | Freq: Once | INTRAVENOUS | Status: AC | PRN
  Administered 2018-01-01: 500 [IU]
  Filled 2018-01-01: qty 5

## 2018-01-01 MED ORDER — OXYCODONE HCL 15 MG PO TABS: 15.0000 mg | ORAL_TABLET | Freq: Three times a day (TID) | ORAL | 0 refills | Status: DC | PRN

## 2018-01-01 MED FILL — oxyCODONE HCL 15 MG TABS: 15 | 30 days supply | Qty: 90 | Fill #0

## 2018-01-01 NOTE — Patient Instructions (Signed)
[facility] Discharge Instructions for Patients Receiving Chemotherapy  Today you received the following chemotherapy agents:  Keytruda (pembrolizumab)  To help prevent nausea and vomiting after your treatment, we encourage you to take your nausea medication as prescribed.   If you develop nausea and vomiting that is not controlled by your nausea medication, call the clinic.   BELOW ARE SYMPTOMS THAT SHOULD BE REPORTED IMMEDIATELY:  *FEVER GREATER THAN 100.5 F  *CHILLS WITH OR WITHOUT FEVER  NAUSEA AND VOMITING THAT IS NOT CONTROLLED WITH YOUR NAUSEA MEDICATION  *UNUSUAL SHORTNESS OF BREATH  *UNUSUAL BRUISING OR BLEEDING  TENDERNESS IN MOUTH AND THROAT WITH OR WITHOUT PRESENCE OF ULCERS  *URINARY PROBLEMS  *BOWEL PROBLEMS  UNUSUAL RASH Items with * indicate a potential emergency and should be followed up as soon as possible.  Feel free to call the clinic should you have any questions or concerns. The clinic phone number is [phone].  Please show the CHEMO ALERT CARD at check-in to the Emergency Department and triage nurse.   

## 2018-01-02 MED FILL — POTASSIUM CHL ER M10 TABLET: 10 | 30 days supply | Qty: 30 | Fill #1

## 2018-01-02 MED FILL — FUROSEMIDE 40 MG TAB: 40 | 30 days supply | Qty: 30 | Fill #1

## 2018-01-02 MED FILL — ISOSORBIDE MN ER 30 MG TAB: 30 | 30 days supply | Qty: 15 | Fill #2

## 2018-01-02 NOTE — Assessment & Plan Note (Signed)
He has intermittent chronic elevated kidney function due to recent diuretic therapy I plan to proceed with CT imaging without contrast next month

## 2018-01-02 NOTE — Assessment & Plan Note (Signed)
He has persistent chronic jaw pain from his prior cancer treatment I have refilled his prescription pain medicine today We will continue supportive care

## 2018-01-02 NOTE — Assessment & Plan Note (Signed)
He was recently diagnosed with congestive heart failure secondary to unknown cause of cardiomyopathy He is currently on medical management I will defer to cardiologist for that The immunotherapy is not the cause of this Clinically, he has no signs or symptoms of congestive heart failure

## 2018-01-02 NOTE — Progress Notes (Signed)
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 Recent CT scan of the chest is stable Nevertheless, he is at risk of recurrence We discussed the risk and benefits of maintenance treatment and he agreed to proceed I plan to repeat imaging studies again in October  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 I have refilled his prescription pain medicine today We will continue supportive care  Chronic kidney disease (CKD), stage II (mild) He has intermittent chronic elevated kidney function due to recent diuretic therapy I plan to proceed with CT imaging without contrast next month  CHF (congestive heart failure) ([place]) He was recently diagnosed with congestive heart failure secondary to unknown cause of cardiomyopathy He is currently on medical management I will defer to cardiologist for that The immunotherapy is not the cause of this Clinically, he has no signs or symptoms of congestive heart failure   Orders Placed This Encounter  Procedures  . CT Chest Wo Contrast    Standing Status:   Future    Standing Expiration Date:   01/01/2019    Order Specific Question:   Preferred imaging location?    Answer:   [facility]    Order Specific Question:   Radiology Contrast Protocol - do NOT remove file path    Answer:   [email or web address]    INTERVAL HISTORY: Please see below for problem oriented charting. He returns for treatment and follow-up He denies recent cough, chest  pain or shortness of breath His weight is stable No recent symptoms of exacerbation of congestive heart failure No recent infection His cancer pain is stable with current prescription pain medicine  SUMMARY OF ONCOLOGIC HISTORY: Oncology History   Tonsillar cancer, HPV positive   Primary site: Pharynx - Oropharynx (Right)   Staging method: AJCC 7th Edition   Clinical: Stage IVA (T2, N2b, M0) signed by [name], MD on 08/19/2013  1:24 PM   Summary: Stage IVA (T2, N2b, M0)       Tonsillar cancer ([place])   07/09/2013 Procedure    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 FNA of right level III lymph node was inconclusive for cancer    07/25/2013 Imaging    PET scan showed locally advanced disease with abnormal lymphadenopathy in the right axilla    08/06/2013 Procedure    CT-guided biopsy of the lymphadenopathy was negative for malignancy    08/15/2013 Surgery    Patient has placement of port and feeding tube    08/19/2013 - 09/10/2013 Chemotherapy    Patient received chemotherapy with cisplatin. The patient only received 2 doses due to uncontrolled nausea and acute renal failure.    08/19/2013 - 10/15/2013 Radiation Therapy    Received Helical IMRT Tomotherapy:  Right Tonstil and bilateral neck / 70 Gy in 35 fractions to gross disease, 63 Gy in 35 fractions to high risk nodal echelons, and 56 Gy in 35 fractions to intermediate risk nodal echelons.    08/27/2013 - 08/30/2013 Hospital Admission    The patient was admitted to the hospital for uncontrolled  nausea vomiting and dehydration.    02/14/2014 Imaging    PET/CT scan showed complete response to treatment    03/19/2014 Surgery    He had excisional lymph node biopsy from the right neck. Pathology was benign    05/13/2014 Surgery    He had removal of Port-A-Cath.    05/22/2014 Imaging    Repeat CT scan of the neck show no evidence of disease recurrence.    12/09/2014 Imaging    Ct neck  without contrast showed persistent abnormalities on the right side of neck, indeterminate    12/25/2014 Imaging    PET CT scan showed disease recurrence.    02/10/2015 Procedure    He has placement of port    02/13/2015 - 07/13/2015 Chemotherapy    He received chemotherapy with carbo/Taxol    04/21/2015 Imaging    PET CT scan showed improved disease control    08/04/2015 Imaging    PET scan showed persistent disease    08/17/2015 -  Chemotherapy    He was started with [facility]    10/19/2015 Imaging    Ct neck showed mass-like intermediate density soft tissue at the right lateral neck recurrence site stable.     02/26/2016 Imaging    CT neck showed unchanged appearance of right neck recurrence compared to 10/19/2015 CT. No noncontrast evidence of new metastatic disease in the neck. Chronic sinusitis, progressed.    02/26/2016 Imaging    Diffuse but patchy and asymmetric partial airspace filling process (ground-glass opacity) in the lungs. This could be due to respiratory bronchiolitis, hypersensitivity pneumonitis or possible drug reaction. Atypical/viral pneumonia is also possible. Pulmonary consultation may be a helpful. A three-month follow-up noncontrast chest CT is suggested. Slight interval enlargement of mediastinal lymph nodes and a small lymph node along the left major fissure. This is most likely due to the inflammatory process in the lungs. No findings for metastatic disease involving the chest. No findings for upper abdominal metastatic disease.    02/29/2016 Adverse Reaction    His treatment is placed on hold due to possible hypersensitivity pneumonitis/drug reaction    04/14/2016 Imaging    Ct chest showed no evidence for metastatic disease within the chest. Significant interval improvement and near complete resolution of previously described diffuse bilateral predominately ground-glass pulmonary opacities, most compatible with resolving infectious/inflammatory process.     09/05/2016 Imaging    CT scan of neck and chest  1. Unchanged appearance of right neck recurrence. 2. No evidence of new metastatic disease in the neck. 3. Unremarkable and stable CT appearance of the chest. No findings suspicious for metastatic disease    07/24/2017 Imaging    1. No findings suspicious for metastatic disease in the chest. 2. No acute consolidative airspace disease to suggest a pneumonia. 3. No appreciable change in chronic mild patchy upper lung predominant centrilobular micronodularity. If the patient is a current smoker, these findings are most compatible with smoking related interstitial lung disease. If the patient is not a current smoker, these findings suggest subacute hypersensitivity pneumonitis  or postinflammatory change. 4. Stable mild biapical radiation fibrosis. 5. Mild to moderate centrilobular emphysema with diffuse bronchial wall thickening, suggesting COPD. 6. One vessel coronary atherosclerosis.  Aortic Atherosclerosis (ICD10-I70.0) and Emphysema (ICD10-J43.9).    09/15/2017 Imaging    1. No evidence of interstitial lung disease. 2. Emphysema (ICD10-J43.9).    10/29/2017 Imaging    1. Moderate quality exam for pulmonary embolism with primary limitation of motion. No embolism identified. 2. Findings of  congestive heart failure, including bilateral pleural effusions and septal thickening. 3. New thoracic adenopathy since approximately 6 weeks ago. Favor secondary to fluid overload/congestive heart failure. 4. New left apical 4 mm pulmonary nodule. Favored to represent a subpleural lymph node. Non-contrast chest CT can be considered in 12 months, given risk factors for primary bronchogenic carcinoma. This recommendation follows the consensus statement: Guidelines for Management of Incidental Pulmonary Nodules Detected on CT Images: From the Fleischner Society 2017; Radiology 2017; 627:035-009.     10/29/2017 - 11/02/2017 Hospital Admission    He was admitted to  the hospital due to shortness of breath and was found to have congestive heart failure.    10/30/2017 Imaging    Definity used; severe global reduction in LV systolic function; severe LVE; restrictive filling; mild MR; mild LAE; mild RVE with moderate RV dysfunction; mild TR with moderate pulmonary hypertension.     REVIEW OF SYSTEMS:   Constitutional: Denies fevers, chills or abnormal weight loss Eyes: Denies blurriness of vision Ears, nose, mouth, throat, and face: Denies mucositis or sore throat Respiratory: Denies cough, dyspnea or wheezes Cardiovascular: Denies palpitation, chest discomfort or lower extremity swelling Gastrointestinal:  Denies nausea, heartburn or change in bowel habits Skin: Denies abnormal skin rashes Lymphatics: Denies new lymphadenopathy or easy bruising Neurological:Denies numbness, tingling or new weaknesses Behavioral/Psych: Mood is stable, no new changes  All other systems were reviewed with the patient and are negative.  I have reviewed the past medical history, past surgical history, social history and family history with the patient and they are unchanged from previous note.  ALLERGIES:  is allergic to bee pollen and pollen extract.  MEDICATIONS:  Current Outpatient Medications  Medication Sig Dispense Refill  . albuterol (PROVENTIL HFA;VENTOLIN HFA) 108 (90 Base) MCG/ACT inhaler Inhale 2 puffs into the lungs every 6 (six) hours as needed for wheezing or shortness of breath. 1 Inhaler 6  . aspirin EC 81 MG tablet Take 81 mg by mouth daily.    . carvedilol (COREG) 3.125 MG tablet Take 1 tablet (3.125 mg total) by mouth 2 (two) times daily with a meal. 60 tablet 1  . fluticasone (FLONASE) 50 MCG/ACT nasal spray Place 2 sprays into both nostrils daily. 16 g 2  . furosemide (LASIX) 40 MG tablet Take 1 tablet (40 mg total) by mouth daily. 30 tablet 6  . hydrALAZINE (APRESOLINE) 25 MG tablet Take 1 tablet (25 mg total) by mouth every 8 (eight) hours. 90  tablet 1  . isosorbide mononitrate (IMDUR) 30 MG 24 hr tablet Take 0.5 tablets (15 mg total) by mouth daily. 30 tablet 1  . levothyroxine (SYNTHROID, LEVOTHROID) 25 MCG tablet TAKE 1 TABLET BY MOUTH DAILY BEFORE BREAKFAST. 90 tablet 1  . lidocaine-prilocaine (EMLA) cream Apply 1 application topically as needed. 30 g 6  . methocarbamol (ROBAXIN) 500 MG tablet Take 1 tablet (500 mg total) by mouth every 8 (eight) hours as needed for muscle spasms. 30 tablet 0  . nitroGLYCERIN (NITROGLYN) 2 % ointment Apply topically as directed. Apply 0.5 inches topically every 8 hours    . oxyCODONE (ROXICODONE) 15 MG immediate release tablet Take 1 tablet (15 mg total) by mouth every 8 (eight) hours as needed. 90 tablet 0  . potassium chloride (K-DUR) 10 MEQ tablet Take 1 tablet (10 mEq total) by mouth daily. 30 tablet 6   No current facility-administered medications for this visit.    Facility-Administered Medications Ordered in Other Visits  Medication Dose Route Frequency Provider Last Rate  Last Dose  . sodium chloride 0.9 % injection 10 mL  10 mL Intravenous PRN [name], MD   10 mL at 11/29/16 0818    PHYSICAL EXAMINATION: ECOG PERFORMANCE STATUS: 1 - Symptomatic but completely ambulatory  Vitals:   01/01/18 0912  BP: 107/72  Pulse: 69  Resp: 18  Temp: 98.2 F (36.8 C)  SpO2: 100%   Filed Weights   01/01/18 0912  Weight: 256 lb 3.2 oz (116.2 kg)    GENERAL:alert, no distress and comfortable SKIN: skin color, texture, turgor are normal, no rashes or significant lesions EYES: normal, Conjunctiva are pink and non-injected, sclera clear OROPHARYNX:no exudate, no erythema and lips, buccal mucosa, and tongue normal  NECK: supple, thyroid normal size, non-tender, without nodularity LYMPH:  no palpable lymphadenopathy in the cervical, axillary or inguinal LUNGS: clear to auscultation and percussion with normal breathing effort HEART: regular rate & rhythm and no murmurs and no lower extremity  edema ABDOMEN:abdomen soft, non-tender and normal bowel sounds Musculoskeletal:no cyanosis of digits and no clubbing  NEURO: alert & oriented x 3 with fluent speech, no focal motor/sensory deficits  LABORATORY DATA:  I have reviewed the data as listed    Component Value Date/Time   NA 142 01/01/2018 0844   NA 141 04/20/2017 1028   K 4.1 01/01/2018 0844   K 4.3 04/20/2017 1028   CL 107 01/01/2018 0844   CO2 26 01/01/2018 0844   CO2 26 04/20/2017 1028   GLUCOSE 86 01/01/2018 0844   GLUCOSE 96 04/20/2017 1028   BUN 15 01/01/2018 0844   BUN 16.7 04/20/2017 1028   CREATININE 1.82 (H) 01/01/2018 0844   CREATININE 2.0 (H) 04/20/2017 1028   CALCIUM 9.3 01/01/2018 0844   CALCIUM 9.0 04/20/2017 1028   PROT 6.9 01/01/2018 0844   PROT 7.0 04/20/2017 1028   ALBUMIN 3.7 01/01/2018 0844   ALBUMIN 4.0 04/20/2017 1028   AST 20 01/01/2018 0844   AST 20 04/20/2017 1028   ALT 10 01/01/2018 0844   ALT 17 04/20/2017 1028   ALKPHOS 67 01/01/2018 0844   ALKPHOS 61 04/20/2017 1028   BILITOT 0.5 01/01/2018 0844   BILITOT 0.49 04/20/2017 1028   GFRNONAA 42 (L) 01/01/2018 0844   GFRAA 48 (L) 01/01/2018 0844    No results found for: SPEP, UPEP  Lab Results  Component Value Date   WBC 6.7 01/01/2018   NEUTROABS 5.6 01/01/2018   HGB 13.4 01/01/2018   HCT 41.7 01/01/2018   MCV 89.8 01/01/2018   PLT 186 01/01/2018      Chemistry      Component Value Date/Time   NA 142 01/01/2018 0844   NA 141 04/20/2017 1028   K 4.1 01/01/2018 0844   K 4.3 04/20/2017 1028   CL 107 01/01/2018 0844   CO2 26 01/01/2018 0844   CO2 26 04/20/2017 1028   BUN 15 01/01/2018 0844   BUN 16.7 04/20/2017 1028   CREATININE 1.82 (H) 01/01/2018 0844   CREATININE 2.0 (H) 04/20/2017 1028      Component Value Date/Time   CALCIUM 9.3 01/01/2018 0844   CALCIUM 9.0 04/20/2017 1028   ALKPHOS 67 01/01/2018 0844   ALKPHOS 61 04/20/2017 1028   AST 20 01/01/2018 0844   AST 20 04/20/2017 1028   ALT 10 01/01/2018 0844    ALT 17 04/20/2017 1028   BILITOT 0.5 01/01/2018 0844   BILITOT 0.49 04/20/2017 1028       All questions were answered. The patient knows to call the  clinic with any problems, questions or concerns. No barriers to learning was detected.  I spent 15 minutes counseling the patient face to face. The total time spent in the appointment was 20 minutes and more than 50% was on counseling and review of test results  [name], MD 01/02/2018 5:25 PM

## 2018-01-02 NOTE — Assessment & Plan Note (Signed)
Clinically, I could not detect any signs of persistent cancer Recent CT scan of the chest is stable Nevertheless, he is at risk of recurrence We discussed the risk and benefits of maintenance treatment and he agreed to proceed I plan to repeat imaging studies again in October

## 2018-01-03 ENCOUNTER — Other Ambulatory Visit: Payer: Self-pay | Admitting: Internal Medicine

## 2018-01-03 MED ORDER — CARVEDILOL 3.125 MG PO TABS: 3.1250 mg | ORAL_TABLET | Freq: Two times a day (BID) | ORAL | 3 refills | Status: DC

## 2018-01-03 MED ORDER — HYDRALAZINE HCL 25 MG PO TABS: 25.0000 mg | ORAL_TABLET | Freq: Three times a day (TID) | ORAL | 3 refills | Status: DC

## 2018-01-03 MED FILL — hydrALAZINE HCL 25 MG TABS: 25 | 30 days supply | Qty: 90 | Fill #0

## 2018-01-03 MED FILL — CARVEDILOL 3.125 MG TABLET: 3.125 | 30 days supply | Qty: 60 | Fill #0

## 2018-01-10 ENCOUNTER — Encounter: Payer: Self-pay | Admitting: Pulmonary Disease

## 2018-01-10 ENCOUNTER — Ambulatory Visit (INDEPENDENT_AMBULATORY_CARE_PROVIDER_SITE_OTHER): Payer: Medicaid Other | Admitting: Pulmonary Disease

## 2018-01-10 DIAGNOSIS — J432 Centrilobular emphysema: Secondary | ICD-10-CM

## 2018-01-10 DIAGNOSIS — I5022 Chronic systolic (congestive) heart failure: Secondary | ICD-10-CM

## 2018-01-10 NOTE — Patient Instructions (Signed)
Breathing problems may have been related to heart issues other than lungs. Use albuterol as needed only

## 2018-01-10 NOTE — Assessment & Plan Note (Signed)
Very mild Breathing problems may have been related to heart issues other than lungs. Use albuterol as needed only

## 2018-01-10 NOTE — Progress Notes (Signed)
   Subjective:    Patient ID: Mason [name], male    DOB: [birth year], 52 y.o.   MRN: [id number]  HPI  52 year old ex-smoker for FU of dyspnea, referred by oncology  He had right tonsillar cancer diagnosed in 2015, initially underwent chemotherapy and radiation but had a recurrence and required 16 cycles of Keytruda. He had an intercurrent critical illness due to aspiration and pneumonia where he dropped his weight down to about 150 pounds but  slowly recovered , his PEG has been discontinued.  He is edentulous and has dentures He smoked more than 30 pack years until he quit in 2008.   On his last visit with me 09/2017 we did not pinpoint etiology of dyspnea but subsequently was admitted 10/2017 with congestive heart failure, EF was down to 15%.  He has follow-up with cardiology and this is been attributed to chemotherapy.  Repeat echo is planned. He states that he has used his albuterol couple of times since then. He has lost weight again down from 278 pounds on his last visit to 50 pounds in the last 3 months. Compliant with Lasix daily   Significant tests/ events reviewed  Spirometry 09/2017  moderate airway restriction with ratio 74, FEV1 of 55% FVC of 59%, no evidence of airway obstruction  Surveillance CT chest 09/18/2017 >>showed mild centrilobular emphysema and biapical scarring likely related to radiation.   Review of Systems Patient denies significant dyspnea,cough, hemoptysis,  chest pain, palpitations, pedal edema, orthopnea, paroxysmal nocturnal dyspnea, lightheadedness, nausea, vomiting, abdominal or  leg pains      Objective:   Physical Exam   Gen. Pleasant, well-nourished, in no distress ENT - no thrush, no post nasal drip Neck: No JVD, no thyromegaly, no carotid bruits Lungs: no use of accessory muscles, no dullness to percussion, clear without rales or rhonchi  Cardiovascular: Rhythm regular, heart sounds  normal, no murmurs or gallops, no peripheral  edema Musculoskeletal: No deformities, no cyanosis or clubbing         Assessment & Plan:

## 2018-01-10 NOTE — Assessment & Plan Note (Signed)
Lasix daily. Repeat echo planned, hopeful for recovery of EF

## 2018-01-15 ENCOUNTER — Telehealth: Payer: Self-pay | Admitting: Internal Medicine

## 2018-01-15 ENCOUNTER — Telehealth: Payer: Self-pay

## 2018-01-15 ENCOUNTER — Other Ambulatory Visit: Payer: Self-pay | Admitting: Hematology and Oncology

## 2018-01-15 DIAGNOSIS — J189 Pneumonia, unspecified organism: Secondary | ICD-10-CM

## 2018-01-15 DIAGNOSIS — T50905A Adverse effect of unspecified drugs, medicaments and biological substances, initial encounter: Principal | ICD-10-CM

## 2018-01-15 NOTE — Telephone Encounter (Signed)
New message  Pt c/o medication issue:  1. Name of Medication:hydrALAZINE (APRESOLINE) 25 MG tablet, carvedilol (COREG) 3.125 MG tablet, isosorbide mononitrate (IMDUR) 30 MG 24 hr tablet  2. How are you currently taking this medication (dosage and times per day)? isosobide take 1/2 twice daily, hydralazine take 3 time daily, carvedilol take twice daily   3. Are you having a reaction (difficulty breathing--STAT)?no   4. What is your medication issue? Patient states that he has rash, loss of appetite, has congestion in head, hands stay dry

## 2018-01-15 NOTE — Telephone Encounter (Signed)
Spoke with pt by phone.  He reports decreased appetite, n/v x 2 weeks with approx 15 lb weight loss, chest congestion and rash to elbow and knee.  Pt states rash started after he started taking hydralazine, carvedilol, and isosorbide mononitrate.  Pt instructed to call his cardiologist regarding the rash. Pt instructed to take OTC guaifenesin for the congestion. Per Dr Alvy Bimler pt to come in tomorrow for CXR, blood work and to see her.  Appts made. Pt is aware of appt date and time and to go have CXR prior to appts. Pt verbalizes understanding of all instructions.

## 2018-01-15 NOTE — Telephone Encounter (Signed)
Patient has been on the meds mentioned below for several months. He feels his symptoms all started when he started taking them.  Today he called oncology and was told to try Mucinex for the congestion. I adv the next time he has an appointment ask the doctor to look at rash--it is behind knees and antecubital.  It is itchy and red and flat.  He tries hydrocortisone but it never goes away.  Tomorrow pt sees his oncologist and will show him.  Pt aware that I will forward to PharmD and to Dr. Harrington Challenger. Advised him that I do not think his symptoms are a result of an intolerance or sensitivity to hydralazine, carvedilol or IMDUR. --but if Dr. Harrington Challenger and Pharmd feel they may be we will call him back.  He is appreciative for the information provided.

## 2018-01-16 ENCOUNTER — Inpatient Hospital Stay (HOSPITAL_BASED_OUTPATIENT_CLINIC_OR_DEPARTMENT_OTHER): Payer: Medicaid Other | Admitting: Hematology and Oncology

## 2018-01-16 ENCOUNTER — Inpatient Hospital Stay: Payer: Medicaid Other | Attending: Hematology and Oncology

## 2018-01-16 ENCOUNTER — Ambulatory Visit (HOSPITAL_COMMUNITY)
Admission: RE | Admit: 2018-01-16 | Discharge: 2018-01-16 | Disposition: A | Discharge status: Home / Self Care | Payer: Medicaid Other | Source: Ambulatory Visit | Attending: Hematology and Oncology | Admitting: Hematology and Oncology

## 2018-01-16 VITALS — BP 108/75 | HR 83 | Temp 98.2°F | Resp 18 | Ht 78.0 in | Wt 245.2 lb

## 2018-01-16 DIAGNOSIS — Z79899 Other long term (current) drug therapy: Secondary | ICD-10-CM

## 2018-01-16 DIAGNOSIS — R0981 Nasal congestion: Secondary | ICD-10-CM | POA: Insufficient documentation

## 2018-01-16 DIAGNOSIS — R1312 Dysphagia, oropharyngeal phase: Secondary | ICD-10-CM | POA: Diagnosis not present

## 2018-01-16 DIAGNOSIS — L270 Generalized skin eruption due to drugs and medicaments taken internally: Secondary | ICD-10-CM | POA: Diagnosis not present

## 2018-01-16 DIAGNOSIS — B370 Candidal stomatitis: Secondary | ICD-10-CM | POA: Diagnosis not present

## 2018-01-16 DIAGNOSIS — G893 Neoplasm related pain (acute) (chronic): Secondary | ICD-10-CM | POA: Insufficient documentation

## 2018-01-16 DIAGNOSIS — R630 Anorexia: Secondary | ICD-10-CM | POA: Insufficient documentation

## 2018-01-16 DIAGNOSIS — B3781 Candidal esophagitis: Secondary | ICD-10-CM | POA: Diagnosis not present

## 2018-01-16 DIAGNOSIS — Z5112 Encounter for antineoplastic immunotherapy: Secondary | ICD-10-CM | POA: Insufficient documentation

## 2018-01-16 DIAGNOSIS — I509 Heart failure, unspecified: Secondary | ICD-10-CM | POA: Diagnosis not present

## 2018-01-16 DIAGNOSIS — Z923 Personal history of irradiation: Secondary | ICD-10-CM

## 2018-01-16 DIAGNOSIS — Z7982 Long term (current) use of aspirin: Secondary | ICD-10-CM | POA: Diagnosis not present

## 2018-01-16 DIAGNOSIS — R634 Abnormal weight loss: Secondary | ICD-10-CM

## 2018-01-16 DIAGNOSIS — Z9221 Personal history of antineoplastic chemotherapy: Secondary | ICD-10-CM | POA: Diagnosis not present

## 2018-01-16 DIAGNOSIS — B379 Candidiasis, unspecified: Secondary | ICD-10-CM | POA: Insufficient documentation

## 2018-01-16 DIAGNOSIS — R490 Dysphonia: Secondary | ICD-10-CM | POA: Insufficient documentation

## 2018-01-16 DIAGNOSIS — C099 Malignant neoplasm of tonsil, unspecified: Secondary | ICD-10-CM | POA: Diagnosis not present

## 2018-01-16 DIAGNOSIS — F419 Anxiety disorder, unspecified: Secondary | ICD-10-CM | POA: Insufficient documentation

## 2018-01-16 DIAGNOSIS — R05 Cough: Secondary | ICD-10-CM | POA: Insufficient documentation

## 2018-01-16 DIAGNOSIS — N182 Chronic kidney disease, stage 2 (mild): Secondary | ICD-10-CM

## 2018-01-16 DIAGNOSIS — C090 Malignant neoplasm of tonsillar fossa: Secondary | ICD-10-CM

## 2018-01-16 DIAGNOSIS — J189 Pneumonia, unspecified organism: Secondary | ICD-10-CM

## 2018-01-16 DIAGNOSIS — E039 Hypothyroidism, unspecified: Secondary | ICD-10-CM | POA: Diagnosis not present

## 2018-01-16 DIAGNOSIS — C770 Secondary and unspecified malignant neoplasm of lymph nodes of head, face and neck: Secondary | ICD-10-CM

## 2018-01-16 DIAGNOSIS — Z87891 Personal history of nicotine dependence: Secondary | ICD-10-CM | POA: Diagnosis not present

## 2018-01-16 DIAGNOSIS — T50905A Adverse effect of unspecified drugs, medicaments and biological substances, initial encounter: Secondary | ICD-10-CM

## 2018-01-16 DIAGNOSIS — G4701 Insomnia due to medical condition: Secondary | ICD-10-CM | POA: Diagnosis not present

## 2018-01-16 LAB — CBC WITH DIFFERENTIAL/PLATELET
BASOS ABS: 0.1 10*3/uL (ref 0.0–0.1)
Basophils Relative: 1 %
Eosinophils Absolute: 0.1 10*3/uL (ref 0.0–0.5)
Eosinophils Relative: 1 %
HEMATOCRIT: 46.6 % (ref 38.4–49.9)
HEMOGLOBIN: 14.9 g/dL (ref 13.0–17.1)
LYMPHS PCT: 7 %
Lymphs Abs: 0.6 10*3/uL — ABNORMAL LOW (ref 0.9–3.3)
MCH: 28.5 pg (ref 27.2–33.4)
MCHC: 32 g/dL (ref 32.0–36.0)
MCV: 89 fL (ref 79.3–98.0)
Monocytes Absolute: 0.7 10*3/uL (ref 0.1–0.9)
Monocytes Relative: 8 %
NEUTROS ABS: 8 10*3/uL — AB (ref 1.5–6.5)
NEUTROS PCT: 83 %
Platelets: 212 10*3/uL (ref 140–400)
RBC: 5.24 MIL/uL (ref 4.20–5.82)
RDW: 16 % — ABNORMAL HIGH (ref 11.0–14.6)
WBC: 9.5 10*3/uL (ref 4.0–10.3)

## 2018-01-16 LAB — COMPREHENSIVE METABOLIC PANEL
ALK PHOS: 91 U/L (ref 38–126)
ALT: 15 U/L (ref 0–44)
AST: 20 U/L (ref 15–41)
Albumin: 3.7 g/dL (ref 3.5–5.0)
Anion gap: 9 (ref 5–15)
BILIRUBIN TOTAL: 0.6 mg/dL (ref 0.3–1.2)
BUN: 14 mg/dL (ref 6–20)
CALCIUM: 9.5 mg/dL (ref 8.9–10.3)
CO2: 28 mmol/L (ref 22–32)
CREATININE: 1.81 mg/dL — AB (ref 0.61–1.24)
Chloride: 104 mmol/L (ref 98–111)
GFR calc Af Amer: 49 mL/min — ABNORMAL LOW (ref >60)
GFR, EST NON AFRICAN AMERICAN: 42 mL/min — AB (ref >60)
Glucose, Bld: 99 mg/dL (ref 70–99)
Potassium: 3.9 mmol/L (ref 3.5–5.1)
Sodium: 141 mmol/L (ref 135–145)
TOTAL PROTEIN: 7.7 g/dL (ref 6.5–8.1)

## 2018-01-16 LAB — TSH: TSH: 1.928 u[IU]/mL (ref 0.320–4.118)

## 2018-01-16 MED ORDER — FLUCONAZOLE 100 MG PO TABS: 100.0000 mg | ORAL_TABLET | Freq: Every day | ORAL | 0 refills | Status: DC

## 2018-01-16 MED FILL — FLUCONAZOLE 100 MG TAB: 100 | 7 days supply | Qty: 7 | Fill #0

## 2018-01-16 NOTE — Telephone Encounter (Signed)
Rash is listed with all of these medications, but generally would not anticipate a drug rash to present like this. I agree that continuing medications and seeking further work up.

## 2018-01-17 ENCOUNTER — Encounter: Payer: Self-pay | Admitting: Hematology and Oncology

## 2018-01-17 DIAGNOSIS — B370 Candidal stomatitis: Secondary | ICD-10-CM | POA: Insufficient documentation

## 2018-01-17 DIAGNOSIS — L270 Generalized skin eruption due to drugs and medicaments taken internally: Secondary | ICD-10-CM | POA: Insufficient documentation

## 2018-01-17 DIAGNOSIS — B3781 Candidal esophagitis: Secondary | ICD-10-CM

## 2018-01-17 NOTE — Progress Notes (Signed)
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  Candidiasis of mouth and esophagus (HCC) Examination of his oropharynx reveal evidence of yeast infection He will be treated with a week of fluconazole If it does not resolve, I will refer him back to see ear nose and throat physician for further evaluation  Drug-induced skin rash He has drug-induced skin rashes likely related to immunotherapy It is only mild, grade 1 I recommend topical steroid cream  Dysphagia, oropharyngeal He complained of oropharyngeal dysphagia Denies recent choking As above, plan to treat him with a week of fluconazole Recommend CT imaging If his symptoms does not resolve, he will be referred to ENT  Chronic kidney disease (CKD), stage II (mild) He has intermittent chronic elevated kidney function due to recent diuretic therapy I plan to proceed with CT imaging without contrast next month   Orders Placed This Encounter  Procedures  . CT Soft Tissue Neck Wo Contrast    Standing Status:   Future    Standing Expiration Date:   01/16/2019    Order Specific Question:   Preferred imaging location?    Answer:   [facility]    Order Specific Question:   Radiology Contrast  Protocol - do NOT remove file path    Answer:   [email or web address]    INTERVAL HISTORY: Please see below for problem oriented charting. He is being evaluated urgently due to multiple symptoms He complained of anorexia, weight loss, skin rashes, nasal congestion and difficulties with swallowing His mobility is poor due to recent diagnosis of congestive heart failure He denies recent cough.  The congestion has improved with Mucinex He had dysphagia of food.  That is apparently new since the last time I saw him. If he eat and swallow slowly, dysphagia is less. With his skin rashes, he has been using a topical emollient cream and steroid cream He has not seen ENT physician for long time Denies recent smoking or alcohol intake Denies recent fever or chills No recent chest pain or shortness of breath.  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 FNA of right level III lymph node was inconclusive for cancer    07/25/2013 Imaging    PET scan showed locally advanced disease with abnormal lymphadenopathy in the right axilla    08/06/2013 Procedure    CT-guided biopsy of the lymphadenopathy was negative for malignancy  08/15/2013 Surgery    Mason [name] has placement of port and feeding tube    08/19/2013 - 09/10/2013 Chemotherapy    Mason [name] received chemotherapy with cisplatin. The Mason [name] only received 2 doses due to uncontrolled nausea and acute renal failure.    08/19/2013 - 10/15/2013 Radiation Therapy    Received Helical IMRT Tomotherapy:  Right Tonstil and bilateral neck / 70 Gy in 35 fractions  to gross disease, 63 Gy in 35 fractions to high risk nodal echelons, and 56 Gy in 35 fractions to intermediate risk nodal echelons.    08/27/2013 - 08/30/2013 Hospital Admission    The Mason [name] was admitted to the hospital for uncontrolled nausea vomiting and dehydration.    02/14/2014 Imaging    PET/CT scan showed complete response to treatment    03/19/2014 Surgery    He had excisional lymph node biopsy from the right neck. Pathology was benign    05/13/2014 Surgery    He had removal of Port-A-Cath.    05/22/2014 Imaging    Repeat CT scan of the neck show no evidence of disease recurrence.    12/09/2014 Imaging    Ct neck without contrast showed persistent abnormalities on the right side of neck, indeterminate    12/25/2014 Imaging    PET CT scan showed disease recurrence.    02/10/2015 Procedure    He has placement of port    02/13/2015 - 07/13/2015 Chemotherapy    He received chemotherapy with carbo/Taxol    04/21/2015 Imaging    PET CT scan showed improved disease control    08/04/2015 Imaging    PET scan showed persistent disease    08/17/2015 -  Chemotherapy    He was started with [facility]    10/19/2015 Imaging    Ct neck showed mass-like intermediate density soft tissue at the right lateral neck recurrence site stable.     02/26/2016 Imaging    CT neck showed unchanged appearance of right neck recurrence compared to 10/19/2015 CT. No noncontrast evidence of new metastatic disease in the neck. Chronic sinusitis, progressed.    02/26/2016 Imaging    Diffuse but patchy and asymmetric partial airspace filling process (ground-glass opacity) in the lungs. This could be due to respiratory bronchiolitis, hypersensitivity pneumonitis or possible drug reaction. Atypical/viral pneumonia is also possible. Pulmonary consultation may be a helpful. A three-month follow-up noncontrast chest CT is suggested. Slight interval enlargement of mediastinal lymph nodes and a small lymph node along the  left major fissure. This is most likely due to the inflammatory process in the lungs. No findings for metastatic disease involving the chest. No findings for upper abdominal metastatic disease.    02/29/2016 Adverse Reaction    His treatment is placed on hold due to possible hypersensitivity pneumonitis/drug reaction    04/14/2016 Imaging    Ct chest showed no evidence for metastatic disease within the chest. Significant interval improvement and near complete resolution of previously described diffuse bilateral predominately ground-glass pulmonary opacities, most compatible with resolving infectious/inflammatory process.    09/05/2016 Imaging    CT scan of neck and chest  1. Unchanged appearance of right neck recurrence. 2. No evidence of new metastatic disease in the neck. 3. Unremarkable and stable CT appearance of the chest. No findings suspicious for metastatic disease    07/24/2017 Imaging    1. No findings suspicious for metastatic disease in the chest. 2. No acute consolidative airspace disease to suggest a pneumonia. 3. No appreciable change in chronic mild patchy upper lung predominant  centrilobular micronodularity. If the Mason [name] is a current smoker, these findings are most compatible with smoking related interstitial lung disease. If the Mason [name] is not a current smoker, these findings suggest subacute hypersensitivity pneumonitis  or postinflammatory change. 4. Stable mild biapical radiation fibrosis. 5. Mild to moderate centrilobular emphysema with diffuse bronchial wall thickening, suggesting COPD. 6. One vessel coronary atherosclerosis.  Aortic Atherosclerosis (ICD10-I70.0) and Emphysema (ICD10-J43.9).    09/15/2017 Imaging    1. No evidence of interstitial lung disease. 2. Emphysema (ICD10-J43.9).    10/29/2017 Imaging    1. Moderate quality exam for pulmonary embolism with primary limitation of motion. No embolism identified. 2. Findings of congestive heart failure,  including bilateral pleural effusions and septal thickening. 3. New thoracic adenopathy since approximately 6 weeks ago. Favor secondary to fluid overload/congestive heart failure. 4. New left apical 4 mm pulmonary nodule. Favored to represent a subpleural lymph node. Non-contrast chest CT can be considered in 12 months, given risk factors for primary bronchogenic carcinoma. This recommendation follows the consensus statement: Guidelines for Management of Incidental Pulmonary Nodules Detected on CT Images: From the Fleischner Society 2017; Radiology 2017; 454:098-119.     10/29/2017 - 11/02/2017 Hospital Admission    He was admitted to the hospital due to shortness of breath and was found to have congestive heart failure.    10/30/2017 Imaging    Definity used; severe global reduction in LV systolic function; severe LVE; restrictive filling; mild MR; mild LAE; mild RVE with moderate RV dysfunction; mild TR with moderate pulmonary hypertension.     REVIEW OF SYSTEMS:   Constitutional: Denies fevers, chills or abnormal weight loss Eyes: Denies blurriness of vision Cardiovascular: Denies palpitation, chest discomfort or lower extremity swelling Gastrointestinal:  Denies nausea, heartburn or change in bowel habits Lymphatics: Denies new lymphadenopathy or easy bruising Neurological:Denies numbness, tingling or new weaknesses Behavioral/Psych: Mood is stable, no new changes  All other systems were reviewed with the Mason [name] and are negative.  I have reviewed the past medical history, past surgical history, social history and family history with the Mason [name] and they are unchanged from previous note.  ALLERGIES:  is allergic to bee pollen and pollen extract.  MEDICATIONS:  Current Outpatient Medications  Medication Sig Dispense Refill  . albuterol (PROVENTIL HFA;VENTOLIN HFA) 108 (90 Base) MCG/ACT inhaler Inhale 2 puffs into the lungs every 6 (six) hours as needed for wheezing or shortness of  breath. 1 Inhaler 6  . aspirin EC 81 MG tablet Take 81 mg by mouth daily.    . carvedilol (COREG) 3.125 MG tablet Take 1 tablet (3.125 mg total) by mouth 2 (two) times daily with a meal. 60 tablet 3  . fluconazole (DIFLUCAN) 100 MG tablet Take 1 tablet (100 mg total) by mouth daily. 7 tablet 0  . fluticasone (FLONASE) 50 MCG/ACT nasal spray Place 2 sprays into both nostrils daily. 16 g 2  . furosemide (LASIX) 40 MG tablet Take 1 tablet (40 mg total) by mouth daily. 30 tablet 6  . hydrALAZINE (APRESOLINE) 25 MG tablet Take 1 tablet (25 mg total) by mouth every 8 (eight) hours. 90 tablet 3  . isosorbide mononitrate (IMDUR) 30 MG 24 hr tablet Take 0.5 tablets (15 mg total) by mouth daily. 30 tablet 1  . levothyroxine (SYNTHROID, LEVOTHROID) 25 MCG tablet TAKE 1 TABLET BY MOUTH DAILY BEFORE BREAKFAST. 90 tablet 1  . lidocaine-prilocaine (EMLA) cream Apply 1 application topically as needed. 30 g 6  . methocarbamol (ROBAXIN) 500 MG tablet Take 1  tablet (500 mg total) by mouth every 8 (eight) hours as needed for muscle spasms. 30 tablet 0  . nitroGLYCERIN (NITROGLYN) 2 % ointment Apply topically as directed. Apply 0.5 inches topically every 8 hours    . oxyCODONE (ROXICODONE) 15 MG immediate release tablet Take 1 tablet (15 mg total) by mouth every 8 (eight) hours as needed. 90 tablet 0  . potassium chloride (K-DUR) 10 MEQ tablet Take 1 tablet (10 mEq total) by mouth daily. 30 tablet 6   No current facility-administered medications for this visit.    Facility-Administered Medications Ordered in Other Visits  Medication Dose Route Frequency Provider Last Rate Last Dose  . sodium chloride 0.9 % injection 10 mL  10 mL Intravenous PRN [name], MD   10 mL at 11/29/16 0818    PHYSICAL EXAMINATION: ECOG PERFORMANCE STATUS: 1 - Symptomatic but completely ambulatory  Vitals:   01/16/18 1057  BP: 108/75  Pulse: 83  Resp: 18  Temp: 98.2 F (36.8 C)  SpO2: 100%   Filed Weights   01/16/18 1057   Weight: 245 lb 3.2 oz (111.2 kg)    GENERAL:alert, no distress and comfortable SKIN: He had dermatitis type skin lesion over his elbows and his knees without any open sores EYES: normal, Conjunctiva are pink and non-injected, sclera clear OROPHARYNX noted white patches of abnormality in the right tonsillar area, resembling yeast infection NECK: Well-healed surgical scar on the right side of the neck  lYMPH:  no palpable lymphadenopathy in the cervical, axillary or inguinal LUNGS: clear to auscultation and percussion with normal breathing effort HEART: regular rate & rhythm and no murmurs and no lower extremity edema ABDOMEN:abdomen soft, non-tender and normal bowel sounds Musculoskeletal:no cyanosis of digits and no clubbing  NEURO: alert & oriented x 3 with fluent speech, no focal motor/sensory deficits  LABORATORY DATA:  I have reviewed the data as listed    Component Value Date/Time   NA 141 01/16/2018 1034   NA 141 04/20/2017 1028   K 3.9 01/16/2018 1034   K 4.3 04/20/2017 1028   CL 104 01/16/2018 1034   CO2 28 01/16/2018 1034   CO2 26 04/20/2017 1028   GLUCOSE 99 01/16/2018 1034   GLUCOSE 96 04/20/2017 1028   BUN 14 01/16/2018 1034   BUN 16.7 04/20/2017 1028   CREATININE 1.81 (H) 01/16/2018 1034   CREATININE 2.0 (H) 04/20/2017 1028   CALCIUM 9.5 01/16/2018 1034   CALCIUM 9.0 04/20/2017 1028   PROT 7.7 01/16/2018 1034   PROT 7.0 04/20/2017 1028   ALBUMIN 3.7 01/16/2018 1034   ALBUMIN 4.0 04/20/2017 1028   AST 20 01/16/2018 1034   AST 20 04/20/2017 1028   ALT 15 01/16/2018 1034   ALT 17 04/20/2017 1028   ALKPHOS 91 01/16/2018 1034   ALKPHOS 61 04/20/2017 1028   BILITOT 0.6 01/16/2018 1034   BILITOT 0.49 04/20/2017 1028   GFRNONAA 42 (L) 01/16/2018 1034   GFRAA 49 (L) 01/16/2018 1034    No results found for: SPEP, UPEP  Lab Results  Component Value Date   WBC 9.5 01/16/2018   NEUTROABS 8.0 (H) 01/16/2018   HGB 14.9 01/16/2018   HCT 46.6 01/16/2018   MCV  89.0 01/16/2018   PLT 212 01/16/2018      Chemistry      Component Value Date/Time   NA 141 01/16/2018 1034   NA 141 04/20/2017 1028   K 3.9 01/16/2018 1034   K 4.3 04/20/2017 1028   CL 104 01/16/2018 1034  CO2 28 01/16/2018 1034   CO2 26 04/20/2017 1028   BUN 14 01/16/2018 1034   BUN 16.7 04/20/2017 1028   CREATININE 1.81 (H) 01/16/2018 1034   CREATININE 2.0 (H) 04/20/2017 1028      Component Value Date/Time   CALCIUM 9.5 01/16/2018 1034   CALCIUM 9.0 04/20/2017 1028   ALKPHOS 91 01/16/2018 1034   ALKPHOS 61 04/20/2017 1028   AST 20 01/16/2018 1034   AST 20 04/20/2017 1028   ALT 15 01/16/2018 1034   ALT 17 04/20/2017 1028   BILITOT 0.6 01/16/2018 1034   BILITOT 0.49 04/20/2017 1028       RADIOGRAPHIC STUDIES: I have personally reviewed the radiological images as listed and agreed with the findings in the report. Dg Chest 2 View  Result Date: 01/16/2018 CLINICAL DATA:  Drug induced pneumonitis. Worsening cough, congestion EXAM: CHEST - 2 VIEW COMPARISON:  10/29/2017 FINDINGS: Right Port-A-Cath remains in place, unchanged. Heart is normal size. No confluent airspace opacities or effusions. No acute bony abnormality. Degenerative spurring anteriorly in the thoracic spine. IMPRESSION: No active cardiopulmonary disease. Electronically Signed   By: Rolm Baptise M.D.   On: 01/16/2018 10:00    All questions were answered. The Mason [name] knows to call the clinic with any problems, questions or concerns. No barriers to learning was detected.  I spent 30 minutes counseling the Mason [name] face to face. The total time spent in the appointment was 40 minutes and more than 50% was on counseling and review of test results  [name], MD 01/17/2018 8:08 AM

## 2018-01-17 NOTE — Assessment & Plan Note (Signed)
He has intermittent chronic elevated kidney function due to recent diuretic therapy I plan to proceed with CT imaging without contrast next month

## 2018-01-17 NOTE — Assessment & Plan Note (Signed)
He complained of oropharyngeal dysphagia Denies recent choking As above, plan to treat him with a week of fluconazole Recommend CT imaging If his symptoms does not resolve, he will be referred to ENT

## 2018-01-17 NOTE — Assessment & Plan Note (Signed)
He has drug-induced skin rashes likely related to immunotherapy It is only mild, grade 1 I recommend topical steroid cream

## 2018-01-17 NOTE — Assessment & Plan Note (Signed)
He complained of some mild weight loss and swallowing difficulties He has not seen ENT physician for a while I recommend addition of neck CT scan next week On visualization, there appears to be patchy white lesions on the right tonsillar region, likely represent yeast infection I plan to treat him with fluconazole and reassess again when I see him back next week.

## 2018-01-17 NOTE — Assessment & Plan Note (Signed)
Examination of his oropharynx reveal evidence of yeast infection He will be treated with a week of fluconazole If it does not resolve, I will refer him back to see ear nose and throat physician for further evaluation

## 2018-01-26 ENCOUNTER — Ambulatory Visit (HOSPITAL_COMMUNITY)
Admission: RE | Admit: 2018-01-26 | Discharge: 2018-01-26 | Disposition: A | Discharge status: Home / Self Care | Payer: Medicaid Other | Source: Ambulatory Visit | Attending: Hematology and Oncology | Admitting: Hematology and Oncology

## 2018-01-26 DIAGNOSIS — X58XXXA Exposure to other specified factors, initial encounter: Secondary | ICD-10-CM | POA: Insufficient documentation

## 2018-01-26 DIAGNOSIS — J189 Pneumonia, unspecified organism: Secondary | ICD-10-CM

## 2018-01-26 DIAGNOSIS — T50905A Adverse effect of unspecified drugs, medicaments and biological substances, initial encounter: Secondary | ICD-10-CM | POA: Insufficient documentation

## 2018-01-26 DIAGNOSIS — C099 Malignant neoplasm of tonsil, unspecified: Secondary | ICD-10-CM | POA: Insufficient documentation

## 2018-01-26 DIAGNOSIS — R1312 Dysphagia, oropharyngeal phase: Secondary | ICD-10-CM

## 2018-01-29 ENCOUNTER — Inpatient Hospital Stay (HOSPITAL_BASED_OUTPATIENT_CLINIC_OR_DEPARTMENT_OTHER): Payer: Medicaid Other | Admitting: Hematology and Oncology

## 2018-01-29 ENCOUNTER — Inpatient Hospital Stay: Payer: Medicaid Other

## 2018-01-29 ENCOUNTER — Encounter: Payer: Self-pay | Admitting: Hematology and Oncology

## 2018-01-29 DIAGNOSIS — G893 Neoplasm related pain (acute) (chronic): Secondary | ICD-10-CM | POA: Diagnosis not present

## 2018-01-29 DIAGNOSIS — C099 Malignant neoplasm of tonsil, unspecified: Secondary | ICD-10-CM

## 2018-01-29 DIAGNOSIS — N182 Chronic kidney disease, stage 2 (mild): Secondary | ICD-10-CM

## 2018-01-29 DIAGNOSIS — Z923 Personal history of irradiation: Secondary | ICD-10-CM

## 2018-01-29 DIAGNOSIS — G4701 Insomnia due to medical condition: Secondary | ICD-10-CM

## 2018-01-29 DIAGNOSIS — C090 Malignant neoplasm of tonsillar fossa: Secondary | ICD-10-CM | POA: Diagnosis not present

## 2018-01-29 DIAGNOSIS — I509 Heart failure, unspecified: Secondary | ICD-10-CM

## 2018-01-29 DIAGNOSIS — R1312 Dysphagia, oropharyngeal phase: Secondary | ICD-10-CM

## 2018-01-29 DIAGNOSIS — Z7982 Long term (current) use of aspirin: Secondary | ICD-10-CM

## 2018-01-29 DIAGNOSIS — Z79899 Other long term (current) drug therapy: Secondary | ICD-10-CM

## 2018-01-29 DIAGNOSIS — E039 Hypothyroidism, unspecified: Secondary | ICD-10-CM | POA: Diagnosis not present

## 2018-01-29 DIAGNOSIS — F419 Anxiety disorder, unspecified: Secondary | ICD-10-CM

## 2018-01-29 DIAGNOSIS — L270 Generalized skin eruption due to drugs and medicaments taken internally: Secondary | ICD-10-CM

## 2018-01-29 DIAGNOSIS — Z9221 Personal history of antineoplastic chemotherapy: Secondary | ICD-10-CM

## 2018-01-29 DIAGNOSIS — C770 Secondary and unspecified malignant neoplasm of lymph nodes of head, face and neck: Secondary | ICD-10-CM

## 2018-01-29 DIAGNOSIS — G4700 Insomnia, unspecified: Secondary | ICD-10-CM | POA: Insufficient documentation

## 2018-01-29 DIAGNOSIS — Z7189 Other specified counseling: Secondary | ICD-10-CM

## 2018-01-29 DIAGNOSIS — R630 Anorexia: Secondary | ICD-10-CM

## 2018-01-29 DIAGNOSIS — R634 Abnormal weight loss: Secondary | ICD-10-CM

## 2018-01-29 LAB — COMPREHENSIVE METABOLIC PANEL
ALT: 16 U/L (ref 0–44)
AST: 26 U/L (ref 15–41)
Albumin: 4.1 g/dL (ref 3.5–5.0)
Alkaline Phosphatase: 60 U/L (ref 38–126)
Anion gap: 10 (ref 5–15)
BILIRUBIN TOTAL: 0.6 mg/dL (ref 0.3–1.2)
BUN: 21 mg/dL — AB (ref 6–20)
CHLORIDE: 103 mmol/L (ref 98–111)
CO2: 29 mmol/L (ref 22–32)
Calcium: 9.3 mg/dL (ref 8.9–10.3)
Creatinine, Ser: 1.87 mg/dL — ABNORMAL HIGH (ref 0.61–1.24)
GFR, EST AFRICAN AMERICAN: 47 mL/min — AB (ref >60)
GFR, EST NON AFRICAN AMERICAN: 40 mL/min — AB (ref >60)
GLUCOSE: 75 mg/dL (ref 70–99)
POTASSIUM: 3.7 mmol/L (ref 3.5–5.1)
Sodium: 142 mmol/L (ref 135–145)
Total Protein: 7.6 g/dL (ref 6.5–8.1)

## 2018-01-29 LAB — CBC WITH DIFFERENTIAL/PLATELET
Abs Immature Granulocytes: 0.02 10*3/uL (ref 0.00–0.07)
BASOS PCT: 0 %
Basophils Absolute: 0 10*3/uL (ref 0.0–0.1)
EOS ABS: 0.1 10*3/uL (ref 0.0–0.5)
Eosinophils Relative: 2 %
HEMATOCRIT: 44.9 % (ref 39.0–52.0)
Hemoglobin: 14.4 g/dL (ref 13.0–17.0)
IMMATURE GRANULOCYTES: 0 %
LYMPHS ABS: 0.7 10*3/uL (ref 0.7–4.0)
Lymphocytes Relative: 9 %
MCH: 28.6 pg (ref 26.0–34.0)
MCHC: 32.1 g/dL (ref 30.0–36.0)
MCV: 89.1 fL (ref 80.0–100.0)
MONO ABS: 0.8 10*3/uL (ref 0.1–1.0)
MONOS PCT: 11 %
NEUTROS PCT: 78 %
Neutro Abs: 5.7 10*3/uL (ref 1.7–7.7)
PLATELETS: 216 10*3/uL (ref 150–400)
RBC: 5.04 MIL/uL (ref 4.22–5.81)
RDW: 14.7 % (ref 11.5–15.5)
WBC: 7.3 10*3/uL (ref 4.0–10.5)
nRBC: 0 % (ref 0.0–0.2)

## 2018-01-29 MED ORDER — SODIUM CHLORIDE 0.9% FLUSH
10.0000 mL | INTRAVENOUS | Status: DC | PRN
  Administered 2018-01-29: 10 mL
  Filled 2018-01-29: qty 10

## 2018-01-29 MED ORDER — SODIUM CHLORIDE 0.9 % IV SOLN
200.0000 mg | Freq: Once | INTRAVENOUS | Status: AC
  Administered 2018-01-29: 200 mg via INTRAVENOUS
  Filled 2018-01-29: qty 8

## 2018-01-29 MED ORDER — HEPARIN SOD (PORK) LOCK FLUSH 100 UNIT/ML IV SOLN
500.0000 [IU] | Freq: Once | INTRAVENOUS | Status: AC | PRN
  Administered 2018-01-29: 500 [IU]
  Filled 2018-01-29: qty 5

## 2018-01-29 MED ORDER — SODIUM CHLORIDE 0.9 % IV SOLN
Freq: Once | INTRAVENOUS | Status: AC
  Administered 2018-01-29: 10:00:00 via INTRAVENOUS
  Filled 2018-01-29: qty 250

## 2018-01-29 MED ORDER — SODIUM CHLORIDE 0.9 % IJ SOLN
10.0000 mL | INTRAMUSCULAR | Status: DC | PRN
  Administered 2018-01-29: 10 mL via INTRAVENOUS
  Filled 2018-01-29: qty 10

## 2018-01-29 MED ORDER — OXYCODONE HCL 15 MG PO TABS: 15.0000 mg | ORAL_TABLET | Freq: Three times a day (TID) | ORAL | 0 refills | Status: DC | PRN

## 2018-01-29 MED FILL — CARVEDILOL 3.125 MG TABLET: 3.125 | 30 days supply | Qty: 60 | Fill #1

## 2018-01-29 MED FILL — oxyCODONE HCL 15 MG TABS: 15 | 30 days supply | Qty: 90 | Fill #0

## 2018-01-29 MED FILL — FUROSEMIDE 40 MG TAB: 40 | 30 days supply | Qty: 30 | Fill #2

## 2018-01-29 MED FILL — ISOSORBIDE MN ER 30 MG TAB: 30 | 30 days supply | Qty: 15 | Fill #3

## 2018-01-29 NOTE — Assessment & Plan Note (Signed)
This is stable while on current prescription pain medicine We will refill his prescription today

## 2018-01-29 NOTE — Progress Notes (Signed)
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  Continue risk factor modification  Acquired hypothyroidism Repeat TSH is pending.  Will adjust his thyroid medicine as needed  Cancer associated pain This is stable while on current prescription pain medicine We will refill his prescription today  Dysphagia, oropharyngeal He continues to have intermittent dysphagia, improved since resolution of candidiasis We discussed potential referral to get esophageal evaluation and dilation but the Mason [name] declined  Goals of care, counseling/discussion We had extensive discussion about goals of care Even though his disease has responded well to immunotherapy, I explained to him the rationale of being on long-term maintenance treatment and he understand The Mason [name] have emotional response and appears to have insomnia which I think is due to psychological stress I recommend gentle exercise as tolerated and I do not recommend treatment for insomnia for fear it can cause dependency  Insomnia disorder This is due to some anxiety I reassured the Mason [name] For  now, I recommend counseling and monitoring I do not recommend pharmacological treatment for now   No orders of the defined types were placed in this encounter.   INTERVAL HISTORY: Please see below for problem oriented charting. He is seen to review test results He felt better since last time I saw him Dysphagia has improved His cancer pain is stable He complained of some insomnia due to anxiety and fear that his cancer has relapsed Denies recent cough, chest pain or shortness of breath  SUMMARY OF ONCOLOGIC HISTORY: Oncology History   Tonsillar cancer, HPV positive   Primary site: Pharynx - Oropharynx (Right)   Staging method: AJCC 7th Edition   Clinical: Stage IVA (T2, N2b, M0) signed by [name], MD on 08/19/2013  1:24 PM   Summary: Stage IVA (T2, N2b, M0)       Tonsillar cancer ([place])   07/09/2013 Procedure    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 FNA of right level III lymph node was inconclusive for cancer    07/25/2013 Imaging    PET scan showed locally advanced disease with abnormal lymphadenopathy in the right axilla    08/06/2013 Procedure    CT-guided biopsy of the lymphadenopathy was negative for malignancy    08/15/2013 Surgery    Mason [name] has placement of port and feeding tube    08/19/2013 - 09/10/2013 Chemotherapy    Mason [name] received chemotherapy with cisplatin. The Mason [name] only received 2 doses due to uncontrolled nausea and acute renal failure.    08/19/2013 - 10/15/2013 Radiation Therapy    Received Helical IMRT Tomotherapy:  Right Tonstil and bilateral neck / 70 Gy in 35 fractions to gross disease, 63 Gy in 35 fractions  to high risk nodal echelons, and 56 Gy in 35 fractions to intermediate risk nodal echelons.    08/27/2013 - 08/30/2013 Hospital Admission    The Mason [name] was admitted to the hospital for uncontrolled nausea vomiting and dehydration.    02/14/2014 Imaging    PET/CT scan showed complete response to  treatment    03/19/2014 Surgery    He had excisional lymph node biopsy from the right neck. Pathology was benign    05/13/2014 Surgery    He had removal of Port-A-Cath.    05/22/2014 Imaging    Repeat CT scan of the neck show no evidence of disease recurrence.    12/09/2014 Imaging    Ct neck without contrast showed persistent abnormalities on the right side of neck, indeterminate    12/25/2014 Imaging    PET CT scan showed disease recurrence.    02/10/2015 Procedure    He has placement of port    02/13/2015 - 07/13/2015 Chemotherapy    He received chemotherapy with carbo/Taxol    04/21/2015 Imaging    PET CT scan showed improved disease control    08/04/2015 Imaging    PET scan showed persistent disease    08/17/2015 -  Chemotherapy    He was started with [facility]    10/19/2015 Imaging    Ct neck showed mass-like intermediate density soft tissue at the right lateral neck recurrence site stable.     02/26/2016 Imaging    CT neck showed unchanged appearance of right neck recurrence compared to 10/19/2015 CT. No noncontrast evidence of new metastatic disease in the neck. Chronic sinusitis, progressed.    02/26/2016 Imaging    Diffuse but patchy and asymmetric partial airspace filling process (ground-glass opacity) in the lungs. This could be due to respiratory bronchiolitis, hypersensitivity pneumonitis or possible drug reaction. Atypical/viral pneumonia is also possible. Pulmonary consultation may be a helpful. A three-month follow-up noncontrast chest CT is suggested. Slight interval enlargement of mediastinal lymph nodes and a small lymph node along the left major fissure. This is most likely due to the inflammatory process in the lungs. No findings for metastatic disease involving the chest. No findings for upper abdominal metastatic disease.    02/29/2016 Adverse Reaction    His treatment is placed on hold due to possible hypersensitivity pneumonitis/drug reaction    04/14/2016  Imaging    Ct chest showed no evidence for metastatic disease within the chest. Significant interval improvement and near complete resolution of previously described diffuse bilateral predominately ground-glass pulmonary opacities, most compatible with resolving infectious/inflammatory process.    09/05/2016 Imaging    CT scan of neck and chest  1. Unchanged appearance of right neck recurrence. 2. No evidence of new metastatic disease in the neck. 3. Unremarkable and stable CT appearance of the chest. No findings suspicious for metastatic disease    07/24/2017 Imaging    1. No findings suspicious for metastatic disease in the chest. 2. No acute consolidative airspace disease to suggest a pneumonia. 3. No appreciable change in chronic mild patchy upper lung predominant centrilobular micronodularity. If the Mason [name] is a current smoker, these findings are most compatible with smoking related interstitial lung disease. If the Mason [name] is not a current smoker, these findings suggest subacute hypersensitivity pneumonitis  or postinflammatory change. 4. Stable mild biapical radiation fibrosis. 5. Mild to moderate centrilobular emphysema with diffuse bronchial wall thickening, suggesting COPD. 6. One vessel coronary atherosclerosis.  Aortic Atherosclerosis (ICD10-I70.0) and Emphysema (ICD10-J43.9).    09/15/2017 Imaging  1. No evidence of interstitial lung disease. 2. Emphysema (ICD10-J43.9).    10/29/2017 Imaging    1. Moderate quality exam for pulmonary embolism with primary limitation of motion. No embolism identified. 2. Findings of congestive heart failure, including bilateral pleural effusions and septal thickening. 3. New thoracic adenopathy since approximately 6 weeks ago. Favor secondary to fluid overload/congestive heart failure. 4. New left apical 4 mm pulmonary nodule. Favored to represent a subpleural lymph node. Non-contrast chest CT can be considered in 12 months, given risk factors  for primary bronchogenic carcinoma. This recommendation follows the consensus statement: Guidelines for Management of Incidental Pulmonary Nodules Detected on CT Images: From the Fleischner Society 2017; Radiology 2017; [id number].     10/29/2017 - 11/02/2017 Hospital Admission    He was admitted to the hospital due to shortness of breath and was found to have congestive heart failure.    10/30/2017 Imaging    Definity used; severe global reduction in LV systolic function; severe LVE; restrictive filling; mild MR; mild LAE; mild RVE with moderate RV dysfunction; mild TR with moderate pulmonary hypertension.    01/26/2018 Imaging    1. Regression of soft tissue in the postoperative right neck favoring scarring. No new or progressive finding to suggest recurrent disease. 2. Chest CT reported separately    01/26/2018 Imaging    1. No evidence of thoracic metastasis. 2. Port in the anterior chest wall with tip in distal      REVIEW OF SYSTEMS:   Constitutional: Denies fevers, chills or abnormal weight loss Eyes: Denies blurriness of vision Ears, nose, mouth, throat, and face: Denies mucositis or sore throat Respiratory: Denies cough, dyspnea or wheezes Cardiovascular: Denies palpitation, chest discomfort or lower extremity swelling Gastrointestinal:  Denies nausea, heartburn or change in bowel habits Skin: Denies abnormal skin rashes Lymphatics: Denies new lymphadenopathy or easy bruising Neurological:Denies numbness, tingling or new weaknesses Behavioral/Psych: Mood is stable, no new changes  All other systems were reviewed with the Mason [name] and are negative.  I have reviewed the past medical history, past surgical history, social history and family history with the Mason [name] and they are unchanged from previous note.  ALLERGIES:  is allergic to bee pollen and pollen extract.  MEDICATIONS:  Current Outpatient Medications  Medication Sig Dispense Refill  . albuterol (PROVENTIL  HFA;VENTOLIN HFA) 108 (90 Base) MCG/ACT inhaler Inhale 2 puffs into the lungs every 6 (six) hours as needed for wheezing or shortness of breath. 1 Inhaler 6  . aspirin EC 81 MG tablet Take 81 mg by mouth daily.    . carvedilol (COREG) 3.125 MG tablet Take 1 tablet (3.125 mg total) by mouth 2 (two) times daily with a meal. 60 tablet 3  . fluconazole (DIFLUCAN) 100 MG tablet Take 1 tablet (100 mg total) by mouth daily. 7 tablet 0  . fluticasone (FLONASE) 50 MCG/ACT nasal spray Place 2 sprays into both nostrils daily. 16 g 2  . furosemide (LASIX) 40 MG tablet Take 1 tablet (40 mg total) by mouth daily. 30 tablet 6  . hydrALAZINE (APRESOLINE) 25 MG tablet Take 1 tablet (25 mg total) by mouth every 8 (eight) hours. 90 tablet 3  . isosorbide mononitrate (IMDUR) 30 MG 24 hr tablet Take 0.5 tablets (15 mg total) by mouth daily. 30 tablet 1  . levothyroxine (SYNTHROID, LEVOTHROID) 25 MCG tablet TAKE 1 TABLET BY MOUTH DAILY BEFORE BREAKFAST. 90 tablet 1  . lidocaine-prilocaine (EMLA) cream Apply 1 application topically as needed. 30 g 6  . methocarbamol (ROBAXIN)  500 MG tablet Take 1 tablet (500 mg total) by mouth every 8 (eight) hours as needed for muscle spasms. 30 tablet 0  . nitroGLYCERIN (NITROGLYN) 2 % ointment Apply topically as directed. Apply 0.5 inches topically every 8 hours    . oxyCODONE (ROXICODONE) 15 MG immediate release tablet Take 1 tablet (15 mg total) by mouth every 8 (eight) hours as needed. 90 tablet 0  . potassium chloride (K-DUR) 10 MEQ tablet Take 1 tablet (10 mEq total) by mouth daily. 30 tablet 6   No current facility-administered medications for this visit.    Facility-Administered Medications Ordered in Other Visits  Medication Dose Route Frequency Provider Last Rate Last Dose  . sodium chloride 0.9 % injection 10 mL  10 mL Intravenous PRN [name], MD   10 mL at 11/29/16 0818    PHYSICAL EXAMINATION: ECOG PERFORMANCE STATUS: 1 - Symptomatic but completely  ambulatory  Vitals:   01/29/18 0853  BP: 105/69  Pulse: 75  Resp: 18  Temp: 98.5 F (36.9 C)  SpO2: 100%   Filed Weights   01/29/18 0853  Weight: 243 lb 3.2 oz (110.3 kg)    GENERAL:alert, no distress and comfortable SKIN: skin color, texture, turgor are normal, no rashes or significant lesions EYES: normal, Conjunctiva are pink and non-injected, sclera clear OROPHARYNX:no exudate, no erythema and lips, buccal mucosa, and tongue normal  NECK: supple, thyroid normal size, non-tender, without nodularity LYMPH:  no palpable lymphadenopathy in the cervical, axillary or inguinal LUNGS: clear to auscultation and percussion with normal breathing effort HEART: regular rate & rhythm and no murmurs and no lower extremity edema ABDOMEN:abdomen soft, non-tender and normal bowel sounds Musculoskeletal:no cyanosis of digits and no clubbing  NEURO: alert & oriented x 3 with fluent speech, no focal motor/sensory deficits  LABORATORY DATA:  I have reviewed the data as listed    Component Value Date/Time   NA 142 01/29/2018 0850   NA 141 04/20/2017 1028   K 3.7 01/29/2018 0850   K 4.3 04/20/2017 1028   CL 103 01/29/2018 0850   CO2 29 01/29/2018 0850   CO2 26 04/20/2017 1028   GLUCOSE 75 01/29/2018 0850   GLUCOSE 96 04/20/2017 1028   BUN 21 (H) 01/29/2018 0850   BUN 16.7 04/20/2017 1028   CREATININE 1.87 (H) 01/29/2018 0850   CREATININE 2.0 (H) 04/20/2017 1028   CALCIUM 9.3 01/29/2018 0850   CALCIUM 9.0 04/20/2017 1028   PROT 7.6 01/29/2018 0850   PROT 7.0 04/20/2017 1028   ALBUMIN 4.1 01/29/2018 0850   ALBUMIN 4.0 04/20/2017 1028   AST 26 01/29/2018 0850   AST 20 04/20/2017 1028   ALT 16 01/29/2018 0850   ALT 17 04/20/2017 1028   ALKPHOS 60 01/29/2018 0850   ALKPHOS 61 04/20/2017 1028   BILITOT 0.6 01/29/2018 0850   BILITOT 0.49 04/20/2017 1028   GFRNONAA 40 (L) 01/29/2018 0850   GFRAA 47 (L) 01/29/2018 0850    No results found for: SPEP, UPEP  Lab Results  Component  Value Date   WBC 7.3 01/29/2018   NEUTROABS 5.7 01/29/2018   HGB 14.4 01/29/2018   HCT 44.9 01/29/2018   MCV 89.1 01/29/2018   PLT 216 01/29/2018      Chemistry      Component Value Date/Time   NA 142 01/29/2018 0850   NA 141 04/20/2017 1028   K 3.7 01/29/2018 0850   K 4.3 04/20/2017 1028   CL 103 01/29/2018 0850   CO2 29 01/29/2018 0850  CO2 26 04/20/2017 1028   BUN 21 (H) 01/29/2018 0850   BUN 16.7 04/20/2017 1028   CREATININE 1.87 (H) 01/29/2018 0850   CREATININE 2.0 (H) 04/20/2017 1028      Component Value Date/Time   CALCIUM 9.3 01/29/2018 0850   CALCIUM 9.0 04/20/2017 1028   ALKPHOS 60 01/29/2018 0850   ALKPHOS 61 04/20/2017 1028   AST 26 01/29/2018 0850   AST 20 04/20/2017 1028   ALT 16 01/29/2018 0850   ALT 17 04/20/2017 1028   BILITOT 0.6 01/29/2018 0850   BILITOT 0.49 04/20/2017 1028       RADIOGRAPHIC STUDIES: I have review all imaging study with the Mason [name] I have personally reviewed the radiological images as listed and agreed with the findings in the report. Dg Chest 2 View  Result Date: 01/16/2018 CLINICAL DATA:  Drug induced pneumonitis. Worsening cough, congestion EXAM: CHEST - 2 VIEW COMPARISON:  10/29/2017 FINDINGS: Right Port-A-Cath remains in place, unchanged. Heart is normal size. No confluent airspace opacities or effusions. No acute bony abnormality. Degenerative spurring anteriorly in the thoracic spine. IMPRESSION: No active cardiopulmonary disease. Electronically Signed   By: Rolm Baptise M.D.   On: 01/16/2018 10:00   Ct Soft Tissue Neck Wo Contrast  Result Date: 01/26/2018 CLINICAL DATA:  Tonsil cancer diagnosis in 2015. History of surgery, radiation, and chemotherapy. EXAM: CT NECK WITHOUT CONTRAST TECHNIQUE: Multidetector CT imaging of the neck was performed following the standard protocol without intravenous contrast. COMPARISON:  09/05/2016 FINDINGS: Pharynx and larynx: No mucosal based mass is seen. Thickening of the epiglottis and  aryepiglottic folds attributed to prior treatment. There is decreased bulk of indistinct soft tissue in the lateral right neck, now with a more bandlike appearance favoring scarring. Salivary glands: Post treatment changes on both sides. Negative for mass Thyroid: Negative Lymph nodes: No enlarged lymph nodes. Calcified, treated node on the right in the mid jugular chain. Vascular: Right IJ sacrifice Limited intracranial: Negative Visualized orbits: Not covered Mastoids and visualized paranasal sinuses: Lobulated mucosal thickening in the maxillary sinuses. Skeleton: No evident metastatic disease. Upper chest: Reported separately Eusebio Blazejewski-RADS not scored on this non contrast study. IMPRESSION: 1. Regression of soft tissue in the postoperative right neck favoring scarring. No new or progressive finding to suggest recurrent disease. 2. Chest CT reported separately Electronically Signed   By: Monte Fantasia M.D.   On: 01/26/2018 13:10   Ct Chest Wo Contrast  Result Date: 01/26/2018 CLINICAL DATA:  Tonsillar carcinoma. EXAM: CT CHEST WITHOUT CONTRAST TECHNIQUE: Multidetector CT imaging of the chest was performed following the standard protocol without IV contrast. COMPARISON:  Chest CT 10/29/2017 FINDINGS: Cardiovascular: Port in the anterior chest wall with tip in distal SVC. No significant vascular findings. Normal heart size. No pericardial effusion. Mediastinum/Nodes: No axillary supraclavicular adenopathy. No mediastinal hilar adenopathy. No pericardial effusion. Esophagus normal. Lungs/Pleura: No suspicious pulmonary nodules. Upper Abdomen: Limited view of the liver, kidneys, pancreas are unremarkable. Normal adrenal glands. Musculoskeletal: No aggressive osseous lesion. Degenerative osteophytosis of the spine. IMPRESSION: 1. No evidence of thoracic metastasis. 2.  Port in the anterior chest wall with tip in distal SVC. Electronically Signed   By: Suzy Bouchard M.D.   On: 01/26/2018 14:30    All questions  were answered. The Mason [name] knows to call the clinic with any problems, questions or concerns. No barriers to learning was detected.  I spent 25 minutes counseling the Mason [name] face to face. The total time spent in the appointment was 30 minutes and more  than 50% was on counseling and review of test results  [name], MD 01/29/2018 10:08 AM

## 2018-01-29 NOTE — Assessment & Plan Note (Signed)
He has intermittent chronic elevated kidney function due to recent diuretic therapy This is stable.  Continue risk factor modification

## 2018-01-29 NOTE — Assessment & Plan Note (Signed)
I have reviewed CT scan of the chest and neck with the patient and perform another oral inspection of his tonsil area The patient has complete response to therapy We discussed the role of immunotherapy as maintenance and he agreed to proceed I do not plan to repeat imaging study again until next year He will continue monthly treatment

## 2018-01-29 NOTE — Assessment & Plan Note (Signed)
Repeat TSH is pending.  Will adjust his thyroid medicine as needed

## 2018-01-29 NOTE — Assessment & Plan Note (Signed)
This is due to some anxiety I reassured the patient For now, I recommend counseling and monitoring I do not recommend pharmacological treatment for now

## 2018-01-29 NOTE — Assessment & Plan Note (Signed)
We had extensive discussion about goals of care Even though his disease has responded well to immunotherapy, I explained to him the rationale of being on long-term maintenance treatment and he understand The patient have emotional response and appears to have insomnia which I think is due to psychological stress I recommend gentle exercise as tolerated and I do not recommend treatment for insomnia for fear it can cause dependency

## 2018-01-29 NOTE — Assessment & Plan Note (Signed)
He continues to have intermittent dysphagia, improved since resolution of candidiasis We discussed potential referral to get esophageal evaluation and dilation but the patient declined

## 2018-01-30 ENCOUNTER — Telehealth: Payer: Self-pay | Admitting: Hematology and Oncology

## 2018-01-30 NOTE — Telephone Encounter (Signed)
Mailed pt calendar of appts  °

## 2018-02-08 MED FILL — hydrALAZINE HCL 25 MG TABS: 25 | 30 days supply | Qty: 90 | Fill #1

## 2018-02-08 MED FILL — LEVOTHYROXINE 25 MCG TABLET: 25 | 90 days supply | Qty: 90 | Fill #1

## 2018-02-09 MED FILL — POTASSIUM CHL ER M10 TABLET: 10 | 30 days supply | Qty: 30 | Fill #2

## 2018-02-15 ENCOUNTER — Inpatient Hospital Stay (HOSPITAL_BASED_OUTPATIENT_CLINIC_OR_DEPARTMENT_OTHER): Payer: Medicaid Other | Admitting: Medical

## 2018-02-15 ENCOUNTER — Other Ambulatory Visit: Payer: Self-pay

## 2018-02-15 ENCOUNTER — Telehealth: Payer: Self-pay

## 2018-02-15 ENCOUNTER — Inpatient Hospital Stay: Payer: Medicaid Other

## 2018-02-15 ENCOUNTER — Telehealth: Payer: Self-pay | Admitting: Emergency Medicine

## 2018-02-15 VITALS — BP 133/89 | HR 84 | Temp 99.6°F | Resp 17 | Ht 78.0 in | Wt 236.0 lb

## 2018-02-15 DIAGNOSIS — R634 Abnormal weight loss: Secondary | ICD-10-CM

## 2018-02-15 DIAGNOSIS — R1312 Dysphagia, oropharyngeal phase: Secondary | ICD-10-CM | POA: Diagnosis not present

## 2018-02-15 DIAGNOSIS — Z87891 Personal history of nicotine dependence: Secondary | ICD-10-CM

## 2018-02-15 DIAGNOSIS — R1314 Dysphagia, pharyngoesophageal phase: Secondary | ICD-10-CM

## 2018-02-15 DIAGNOSIS — C099 Malignant neoplasm of tonsil, unspecified: Secondary | ICD-10-CM

## 2018-02-15 DIAGNOSIS — L270 Generalized skin eruption due to drugs and medicaments taken internally: Secondary | ICD-10-CM

## 2018-02-15 DIAGNOSIS — Z79899 Other long term (current) drug therapy: Secondary | ICD-10-CM

## 2018-02-15 DIAGNOSIS — R490 Dysphonia: Secondary | ICD-10-CM

## 2018-02-15 DIAGNOSIS — C090 Malignant neoplasm of tonsillar fossa: Secondary | ICD-10-CM

## 2018-02-15 DIAGNOSIS — Z7982 Long term (current) use of aspirin: Secondary | ICD-10-CM

## 2018-02-15 DIAGNOSIS — Z923 Personal history of irradiation: Secondary | ICD-10-CM

## 2018-02-15 DIAGNOSIS — Z9221 Personal history of antineoplastic chemotherapy: Secondary | ICD-10-CM

## 2018-02-15 LAB — CBC WITH DIFFERENTIAL (CANCER CENTER ONLY)
ABS IMMATURE GRANULOCYTES: 0.02 10*3/uL (ref 0.00–0.07)
BASOS ABS: 0 10*3/uL (ref 0.0–0.1)
Basophils Relative: 0 %
Eosinophils Absolute: 0.1 10*3/uL (ref 0.0–0.5)
Eosinophils Relative: 1 %
HCT: 46.6 % (ref 39.0–52.0)
Hemoglobin: 14.6 g/dL (ref 13.0–17.0)
Immature Granulocytes: 0 %
Lymphocytes Relative: 8 %
Lymphs Abs: 0.7 10*3/uL (ref 0.7–4.0)
MCH: 28.1 pg (ref 26.0–34.0)
MCHC: 31.3 g/dL (ref 30.0–36.0)
MCV: 89.6 fL (ref 80.0–100.0)
Monocytes Absolute: 1 10*3/uL (ref 0.1–1.0)
Monocytes Relative: 11 %
NEUTROS ABS: 7.1 10*3/uL (ref 1.7–7.7)
NRBC: 0 % (ref 0.0–0.2)
Neutrophils Relative %: 80 %
Platelet Count: 229 10*3/uL (ref 150–400)
RBC: 5.2 MIL/uL (ref 4.22–5.81)
RDW: 14.8 % (ref 11.5–15.5)
WBC: 9 10*3/uL (ref 4.0–10.5)

## 2018-02-15 LAB — CMP (CANCER CENTER ONLY)
ALBUMIN: 3.7 g/dL (ref 3.5–5.0)
ALK PHOS: 72 U/L (ref 38–126)
ALT: 16 U/L (ref 0–44)
AST: 27 U/L (ref 15–41)
Anion gap: 11 (ref 5–15)
BUN: 12 mg/dL (ref 6–20)
CALCIUM: 9.3 mg/dL (ref 8.9–10.3)
CO2: 30 mmol/L (ref 22–32)
CREATININE: 1.81 mg/dL — AB (ref 0.61–1.24)
Chloride: 102 mmol/L (ref 98–111)
GFR, Est AFR Am: 49 mL/min — ABNORMAL LOW (ref >60)
GFR, Estimated: 42 mL/min — ABNORMAL LOW (ref >60)
GLUCOSE: 93 mg/dL (ref 70–99)
Potassium: 3.7 mmol/L (ref 3.5–5.1)
SODIUM: 143 mmol/L (ref 135–145)
Total Bilirubin: 0.6 mg/dL (ref 0.3–1.2)
Total Protein: 7.1 g/dL (ref 6.5–8.1)

## 2018-02-15 MED ORDER — PREDNISONE 10 MG PO TABS: ORAL_TABLET | ORAL | 0 refills | Status: DC

## 2018-02-15 NOTE — Telephone Encounter (Signed)
He called and left a message requesting appt.  Called back. He is complaining of rash all over that is itchy. Complaining of nausea and vomiting. Scheduling message sent for appt labs and see Sandi Mealy, PA.

## 2018-02-15 NOTE — Telephone Encounter (Signed)
Returning pt's VM stating that his medication had not been sent over to the [facility] outpatient pharmacy.  The medication has been faxed and we received confirmation of its receipt by the pharmacy.  Pt verbalized understanding and states that he will pick up the scrip tomorrow when he's back in [place].  Verbalized understanding that he can call back tomorrow with any further issues or concerns.

## 2018-02-15 NOTE — Patient Instructions (Signed)

## 2018-02-15 NOTE — Progress Notes (Signed)
Pt seen by PA Van only, no RN assessment at this time.  PA aware. 

## 2018-02-16 MED FILL — predniSONE 10 MG TABS: 10 | 11 days supply | Qty: 56 | Fill #0

## 2018-02-21 ENCOUNTER — Other Ambulatory Visit: Payer: Self-pay | Admitting: Internal Medicine

## 2018-02-21 MED ORDER — ISOSORBIDE MONONITRATE ER 30 MG PO TB24: 15.0000 mg | ORAL_TABLET | Freq: Every day | ORAL | 2 refills | Status: DC

## 2018-02-21 MED FILL — ISOSORBIDE MN ER 30 MG TAB: 30 | 90 days supply | Qty: 45 | Fill #0

## 2018-02-21 NOTE — Progress Notes (Signed)
[facility] Progress Note   Mason [name] [id number] 1965/07/02 52 y.o.  Mason [name] is managed by Dr. [name]  Actively treated with chemotherapy/immunotherapy: yes  Current Therapy: Monthly Keytruda  Last Treated: 01/29/2018 (cycle 29, day 1)  Assessment: Plan:    Drug-induced skin rash - Plan: predniSONE (DELTASONE) 10 MG tablet  Weight loss  Pharyngoesophageal dysphagia - Plan: predniSONE (DELTASONE) 10 MG tablet, Ambulatory referral to ENT  Primary cancer of tonsillar fossa (HCC)   Drug-induced skin rash: I have discussed with patient that his skin rash could possibly be due to [facility].  He has been given a steroid taper at 1 mg/kg/day initially.  He has been given a 10-day steroid taper in order to have this treatment completed before his next cycle of Keytruda.  Weight loss and dysphasia in the setting of a primary tonsillar cancer: I reviewed the patient's recent chest CT and neck CT with him which showed the following: 1. Regression of soft tissue in the postoperative right neck favoring scarring. No new or progressive finding to suggest recurrent disease.  2. No evidence of thoracic metastasis.  He has been referred to otolaryngology due to his difficulty with swallowing despite the above CT scan results.  Please see After Visit Summary for patient specific instructions.  Future Appointments  Date Time Provider [place]  02/26/2018 10:15 AM [facility] LAB 5 [facility] None  02/26/2018 10:30 AM [facility] INFUSION [facility] None  02/26/2018 11:30 AM [facility] INFUSION [facility] None  03/26/2018 10:15 AM [facility] LAB ONLY [facility] None  03/26/2018 10:30 AM [facility] [place] FLUSH [facility] None  03/26/2018 11:00 AM [name], MD [facility] None  03/26/2018 11:30 AM [facility] INFUSION [facility] None    Orders Placed This Encounter  Procedures  . Ambulatory referral to ENT       Subjective:   Patient ID:   Mason [name] is a 52 y.o. (DOB [birth year]) male.  Chief Complaint: No chief complaint on file.   HPI Mason [name] is a 52 year old male with a of tonsillar cancer who is managed by Dr. [name] and who receives monthly Keytruda.  He has had an ongoing diffuse pruritic rash over his upper and lower extremities since starting Keytruda.  His rash is worsening.  He does not recall any treatments for this rash.  He also reports recent weight loss.  By our records he has lost 9 pounds since the beginning of October.  He believes that he may have lost as much as 11 pounds.  He is having difficulty swallowing.  He reports that food gets stuck in the back of his throat.  He is also having hoarseness.  His most recent restaging CT scans from 01/26/2018 showed the following:  1. Regression of soft tissue in the postoperative right neck favoring scarring. No new or progressive finding to suggest recurrent disease.  2. No evidence of thoracic metastasis.  He has not been seen by otolaryngology since his diagnosis.  He would like to be evaluated.    Medications: I have reviewed the patient's current medications.  Allergies:  Allergies  Allergen Reactions  . Bee Pollen     Watery eyes, runny nose, sneezing  . Pollen Extract Other (See Comments)    Watery eyes, runny nose, sneezing    Past Medical History:  Diagnosis Date  . Abnormal liver function test   . Acute sinusitis, unspecified 05/18/2015  . Anemia   . Anxiety    mild new dx  .  Arthritis    knees,hips  . Bilateral edema of lower extremity   . Complication of anesthesia    Pt stated " my oxygen level was slow in rising."  . Concussion    Hx: in high school x 2  . Constipation   . Dysuria 09/04/2015  . Fever   . Hyperactive gag reflex   . Hypertension   . Hypoglycemia   . Hypokalemia   . Insomnia 06/15/2015  . Knee pain, chronic   . Malnutrition ([place])   . Non-healing surgical wound 05/23/2014  . PEG (percutaneous  endoscopic gastrostomy) status ([place])   . Renal failure, acute ([place])   . S/P radiation therapy 08/19/2013-10/15/2013   Right Tonstil and bilateral neck / 70 Gy in 35 fractions to gross disease, 63 Gy in 35 fractions to high risk nodal echelons, and 56 Gy in 35 fractions to intermediate risk nodal echelons  . Severe nausea and vomiting   . Status post chemotherapy    Only received 2 doses due to uncontrolled nausea and acute renal failure  . Tonsillar cancer ([place]) 07/09/13   SCCA of Right Tonsil, recurrent 2016    Past Surgical History:  Procedure Laterality Date  . LAPAROSCOPIC GASTROSTOMY N/A 08/15/2013   Procedure: LAPAROSCOPIC GASTROSTOMY TUBE PLACEMENT;  Surgeon: [name], MD;  Location: [place];  Service: General;  Laterality: N/A;  . LYMPH NODE BIOPSY  03/20/14   right neck  . MULTIPLE EXTRACTIONS WITH ALVEOLOPLASTY N/A 08/01/2013   Procedure: Extraction of tooth #'s 1,15,17,31, 32 with alveoloplasty, mandibular left torus reduction, and gross debridement of remaining teeth.;  Surgeon: [name], DDS;  Location: [place];  Service: Oral Surgery;  Laterality: N/A;  . PORT-A-CATH REMOVAL N/A 05/13/2014   Procedure: REMOVAL of PORT-A-CATH;  Surgeon: [name], MD;  Location: [place];  Service: General;  Laterality: N/A;  . PORTACATH PLACEMENT Left 08/15/2013   Procedure: INSERTION PORT-A-CATH;  Surgeon: [name], MD;  Location: [place];  Service: General;  Laterality: Left;    Family History  Problem Relation Age of Onset  . Arthritis Mother   . Heart disease Mother   . Arthritis Father   . CVA Unknown   . Diabetes Unknown   . Heart attack Unknown   . Heart disease Maternal Grandmother   . CVA Maternal Grandmother     Social History   Socioeconomic History  . Marital status: Single    Spouse name: Not on file  . Number of children: 1  . Years of education: Not on file  . Highest education level: Not on file  Occupational History  . Not on file  Social Needs    . Financial resource strain: Not on file  . Food insecurity:    Worry: Not on file    Inability: Not on file  . Transportation needs:    Medical: Not on file    Non-medical: Not on file  Tobacco Use  . Smoking status: Former Smoker    Packs/day: 1.00    Years: 20.00    Pack years: 20.00    Types: Cigarettes    Last attempt to quit: 07/16/2003    Years since quitting: 14.6  . Smokeless tobacco: Never Used  Substance and Sexual Activity  . Alcohol use: No    Comment: none years ago  . Drug use: Yes    Types: Marijuana, Benzodiazepines    Comment: years ago  . Sexual activity: Never  Lifestyle  . Physical activity:    Days  per week: Not on file    Minutes per session: Not on file  . Stress: Not on file  Relationships  . Social connections:    Talks on phone: Not on file    Gets together: Not on file    Attends religious service: Not on file    Active member of club or organization: Not on file    Attends meetings of clubs or organizations: Not on file    Relationship status: Not on file  . Intimate partner violence:    Fear of current or ex partner: Not on file    Emotionally abused: Not on file    Physically abused: Not on file    Forced sexual activity: Not on file  Other Topics Concern  . Not on file  Social History Narrative  . Not on file    Past Medical History, Surgical history, Social history, and Family history were reviewed and updated as appropriate.   Please see review of systems for further details on the patient's review from today.   Review of Systems:  Review of Systems  Constitutional: Positive for unexpected weight change. Negative for chills, diaphoresis and fever.  HENT: Positive for trouble swallowing and voice change.   Respiratory: Negative for cough, chest tightness, shortness of breath and wheezing.   Cardiovascular: Negative for chest pain and palpitations.  Gastrointestinal: Negative for abdominal pain, constipation, diarrhea, nausea  and vomiting.  Musculoskeletal: Negative for back pain and myalgias.  Neurological: Negative for dizziness, light-headedness and headaches.    Objective:   Physical Exam:  BP 133/89 (BP Location: Left Arm, Patient Position: Sitting)   Pulse 84   Temp 99.6 F (37.6 C) (Oral) Comment: RN Liza aware of temp.  Resp 17   Ht 6\' 6"  (1.981 m)   Wt 236 lb (107 kg)   SpO2 99%   BMI 27.27 kg/m  ECOG: 0  Physical Exam  Constitutional: No distress.  HENT:  Head: Normocephalic and atraumatic.  Mouth/Throat: Oropharynx is clear and moist. No oropharyngeal exudate.  Dentition is in disrepair.  Neck: Normal range of motion.  Hyperpigmentation of the neck is noted in an area associated with a radiation field.  Cardiovascular: Normal rate, regular rhythm and normal heart sounds. Exam reveals no gallop and no friction rub.  No murmur heard. Pulmonary/Chest: Effort normal and breath sounds normal. No respiratory distress. He has no wheezes. He has no rales.  Lymphadenopathy:    He has no cervical adenopathy.  Neurological: He is alert. Coordination normal.  Skin: Skin is warm and dry. No rash noted. He is not diaphoretic. No erythema.  Psychiatric: He has a normal mood and affect. His behavior is normal. Judgment and thought content normal.    Lab Review:     Component Value Date/Time   NA 143 02/15/2018 1501   NA 141 04/20/2017 1028   K 3.7 02/15/2018 1501   K 4.3 04/20/2017 1028   CL 102 02/15/2018 1501   CO2 30 02/15/2018 1501   CO2 26 04/20/2017 1028   GLUCOSE 93 02/15/2018 1501   GLUCOSE 96 04/20/2017 1028   BUN 12 02/15/2018 1501   BUN 16.7 04/20/2017 1028   CREATININE 1.81 (H) 02/15/2018 1501   CREATININE 2.0 (H) 04/20/2017 1028   CALCIUM 9.3 02/15/2018 1501   CALCIUM 9.0 04/20/2017 1028   PROT 7.1 02/15/2018 1501   PROT 7.0 04/20/2017 1028   ALBUMIN 3.7 02/15/2018 1501   ALBUMIN 4.0 04/20/2017 1028   AST 27 02/15/2018 1501  AST 20 04/20/2017 1028   ALT 16 02/15/2018  1501   ALT 17 04/20/2017 1028   ALKPHOS 72 02/15/2018 1501   ALKPHOS 61 04/20/2017 1028   BILITOT 0.6 02/15/2018 1501   BILITOT 0.49 04/20/2017 1028   GFRNONAA 42 (L) 02/15/2018 1501   GFRAA 49 (L) 02/15/2018 1501       Component Value Date/Time   WBC 9.0 02/15/2018 1501   WBC 7.3 01/29/2018 0850   RBC 5.20 02/15/2018 1501   HGB 14.6 02/15/2018 1501   HGB 12.8 (L) 04/20/2017 1028   HCT 46.6 02/15/2018 1501   HCT 40.0 04/20/2017 1028   PLT 229 02/15/2018 1501   PLT 187 04/20/2017 1028   MCV 89.6 02/15/2018 1501   MCV 92.6 04/20/2017 1028   MCH 28.1 02/15/2018 1501   MCHC 31.3 02/15/2018 1501   RDW 14.8 02/15/2018 1501   RDW 13.7 04/20/2017 1028   LYMPHSABS 0.7 02/15/2018 1501   LYMPHSABS 0.8 (L) 04/20/2017 1028   MONOABS 1.0 02/15/2018 1501   MONOABS 0.4 04/20/2017 1028   EOSABS 0.1 02/15/2018 1501   EOSABS 0.2 04/20/2017 1028   BASOSABS 0.0 02/15/2018 1501   BASOSABS 0.0 04/20/2017 1028   -------------------------------  Imaging from last 24 hours (if applicable):  Radiology interpretation: Ct Soft Tissue Neck Wo Contrast  Result Date: 01/26/2018 CLINICAL DATA:  Tonsil cancer diagnosis in 2015. History of surgery, radiation, and chemotherapy. EXAM: CT NECK WITHOUT CONTRAST TECHNIQUE: Multidetector CT imaging of the neck was performed following the standard protocol without intravenous contrast. COMPARISON:  09/05/2016 FINDINGS: Pharynx and larynx: No mucosal based mass is seen. Thickening of the epiglottis and aryepiglottic folds attributed to prior treatment. There is decreased bulk of indistinct soft tissue in the lateral right neck, now with a more bandlike appearance favoring scarring. Salivary glands: Post treatment changes on both sides. Negative for mass Thyroid: Negative Lymph nodes: No enlarged lymph nodes. Calcified, treated node on the right in the mid jugular chain. Vascular: Right IJ sacrifice Limited intracranial: Negative Visualized orbits: Not covered  Mastoids and visualized paranasal sinuses: Lobulated mucosal thickening in the maxillary sinuses. Skeleton: No evident metastatic disease. Upper chest: Reported separately NI-RADS not scored on this non contrast study. IMPRESSION: 1. Regression of soft tissue in the postoperative right neck favoring scarring. No new or progressive finding to suggest recurrent disease. 2. Chest CT reported separately Electronically Signed   By: Monte Fantasia M.D.   On: 01/26/2018 13:10   Ct Chest Wo Contrast  Result Date: 01/26/2018 CLINICAL DATA:  Tonsillar carcinoma. EXAM: CT CHEST WITHOUT CONTRAST TECHNIQUE: Multidetector CT imaging of the chest was performed following the standard protocol without IV contrast. COMPARISON:  Chest CT 10/29/2017 FINDINGS: Cardiovascular: Port in the anterior chest wall with tip in distal SVC. No significant vascular findings. Normal heart size. No pericardial effusion. Mediastinum/Nodes: No axillary supraclavicular adenopathy. No mediastinal hilar adenopathy. No pericardial effusion. Esophagus normal. Lungs/Pleura: No suspicious pulmonary nodules. Upper Abdomen: Limited view of the liver, kidneys, pancreas are unremarkable. Normal adrenal glands. Musculoskeletal: No aggressive osseous lesion. Degenerative osteophytosis of the spine. IMPRESSION: 1. No evidence of thoracic metastasis. 2.  Port in the anterior chest wall with tip in distal SVC. Electronically Signed   By: Suzy Bouchard M.D.   On: 01/26/2018 14:30

## 2018-02-22 MED FILL — CARVEDILOL 3.125 MG TABLET: 3.125 | 30 days supply | Qty: 60 | Fill #2

## 2018-02-26 ENCOUNTER — Inpatient Hospital Stay: Payer: Medicaid Other

## 2018-02-26 ENCOUNTER — Inpatient Hospital Stay: Payer: Medicaid Other | Attending: Hematology and Oncology

## 2018-02-26 ENCOUNTER — Inpatient Hospital Stay (HOSPITAL_BASED_OUTPATIENT_CLINIC_OR_DEPARTMENT_OTHER): Payer: Medicaid Other | Admitting: Medical

## 2018-02-26 VITALS — BP 133/78 | HR 71 | Temp 98.4°F | Resp 18 | Ht 78.0 in | Wt 232.5 lb

## 2018-02-26 DIAGNOSIS — C090 Malignant neoplasm of tonsillar fossa: Secondary | ICD-10-CM | POA: Insufficient documentation

## 2018-02-26 DIAGNOSIS — C099 Malignant neoplasm of tonsil, unspecified: Secondary | ICD-10-CM

## 2018-02-26 DIAGNOSIS — Z5112 Encounter for antineoplastic immunotherapy: Secondary | ICD-10-CM | POA: Diagnosis not present

## 2018-02-26 DIAGNOSIS — C770 Secondary and unspecified malignant neoplasm of lymph nodes of head, face and neck: Secondary | ICD-10-CM

## 2018-02-26 DIAGNOSIS — Z23 Encounter for immunization: Secondary | ICD-10-CM | POA: Insufficient documentation

## 2018-02-26 LAB — CBC WITH DIFFERENTIAL/PLATELET
ABS IMMATURE GRANULOCYTES: 0.05 10*3/uL (ref 0.00–0.07)
Basophils Absolute: 0 10*3/uL (ref 0.0–0.1)
Basophils Relative: 0 %
Eosinophils Absolute: 0 10*3/uL (ref 0.0–0.5)
Eosinophils Relative: 0 %
HCT: 44.1 % (ref 39.0–52.0)
HEMOGLOBIN: 14 g/dL (ref 13.0–17.0)
IMMATURE GRANULOCYTES: 1 %
LYMPHS PCT: 6 %
Lymphs Abs: 0.6 10*3/uL — ABNORMAL LOW (ref 0.7–4.0)
MCH: 28.4 pg (ref 26.0–34.0)
MCHC: 31.7 g/dL (ref 30.0–36.0)
MCV: 89.5 fL (ref 80.0–100.0)
MONOS PCT: 6 %
Monocytes Absolute: 0.7 10*3/uL (ref 0.1–1.0)
NEUTROS ABS: 9.5 10*3/uL — AB (ref 1.7–7.7)
NEUTROS PCT: 87 %
Platelets: 245 10*3/uL (ref 150–400)
RBC: 4.93 MIL/uL (ref 4.22–5.81)
RDW: 15.3 % (ref 11.5–15.5)
WBC: 10.8 10*3/uL — ABNORMAL HIGH (ref 4.0–10.5)
nRBC: 0 % (ref 0.0–0.2)

## 2018-02-26 LAB — COMPREHENSIVE METABOLIC PANEL
ALBUMIN: 3.5 g/dL (ref 3.5–5.0)
ALK PHOS: 61 U/L (ref 38–126)
ALT: 17 U/L (ref 0–44)
AST: 17 U/L (ref 15–41)
Anion gap: 8 (ref 5–15)
BUN: 16 mg/dL (ref 6–20)
CHLORIDE: 102 mmol/L (ref 98–111)
CO2: 30 mmol/L (ref 22–32)
CREATININE: 1.67 mg/dL — AB (ref 0.61–1.24)
Calcium: 8.7 mg/dL — ABNORMAL LOW (ref 8.9–10.3)
GFR calc non Af Amer: 46 mL/min — ABNORMAL LOW (ref >60)
GFR, EST AFRICAN AMERICAN: 54 mL/min — AB (ref >60)
GLUCOSE: 82 mg/dL (ref 70–99)
Potassium: 3.3 mmol/L — ABNORMAL LOW (ref 3.5–5.1)
SODIUM: 140 mmol/L (ref 135–145)
Total Bilirubin: 0.7 mg/dL (ref 0.3–1.2)
Total Protein: 6.7 g/dL (ref 6.5–8.1)

## 2018-02-26 MED ORDER — SODIUM CHLORIDE 0.9 % IV SOLN
Freq: Once | INTRAVENOUS | Status: AC
  Administered 2018-02-26: 11:00:00 via INTRAVENOUS
  Filled 2018-02-26: qty 250

## 2018-02-26 MED ORDER — SODIUM CHLORIDE 0.9% FLUSH
10.0000 mL | INTRAVENOUS | Status: DC | PRN
  Administered 2018-02-26: 10 mL
  Filled 2018-02-26: qty 10

## 2018-02-26 MED ORDER — SODIUM CHLORIDE 0.9 % IV SOLN
200.0000 mg | Freq: Once | INTRAVENOUS | Status: AC
  Administered 2018-02-26: 200 mg via INTRAVENOUS
  Filled 2018-02-26: qty 8

## 2018-02-26 MED ORDER — OXYCODONE HCL 15 MG PO TABS: 15.0000 mg | ORAL_TABLET | Freq: Three times a day (TID) | ORAL | 0 refills | Status: DC | PRN

## 2018-02-26 MED ORDER — INFLUENZA VAC SPLIT QUAD 0.5 ML IM SUSY
0.5000 mL | PREFILLED_SYRINGE | Freq: Once | INTRAMUSCULAR | Status: AC
  Administered 2018-02-26: 0.5 mL via INTRAMUSCULAR

## 2018-02-26 MED ORDER — HEPARIN SOD (PORK) LOCK FLUSH 100 UNIT/ML IV SOLN
500.0000 [IU] | Freq: Once | INTRAVENOUS | Status: AC | PRN
  Administered 2018-02-26: 500 [IU]
  Filled 2018-02-26: qty 5

## 2018-02-26 MED FILL — oxyCODONE HCL 15 MG TABS: 15 | 30 days supply | Qty: 90 | Fill #0

## 2018-02-26 NOTE — Patient Instructions (Addendum)
Cancer Center Discharge Instructions for Patients Receiving Chemotherapy  Today you received the following chemotherapy agents: Pembrolizumab (Keytruda)  To help prevent nausea and vomiting after your treatment, we encourage you to take your nausea medication as directed.    If you develop nausea and vomiting that is not controlled by your nausea medication, call the clinic.   BELOW ARE SYMPTOMS THAT SHOULD BE REPORTED IMMEDIATELY:  *FEVER GREATER THAN 100.5 F  *CHILLS WITH OR WITHOUT FEVER  NAUSEA AND VOMITING THAT IS NOT CONTROLLED WITH YOUR NAUSEA MEDICATION  *UNUSUAL SHORTNESS OF BREATH  *UNUSUAL BRUISING OR BLEEDING  TENDERNESS IN MOUTH AND THROAT WITH OR WITHOUT PRESENCE OF ULCERS  *URINARY PROBLEMS  *BOWEL PROBLEMS  UNUSUAL RASH Items with * indicate a potential emergency and should be followed up as soon as possible.  Feel free to call the clinic should you have any questions or concerns. The clinic phone number is [phone].  Please show the CHEMO ALERT CARD at check-in to the Emergency Department and triage nurse.  Influenza (Flu) Vaccine (Inactivated or Recombinant): What You Need to Know 1. Why get vaccinated? Influenza ("flu") is a contagious disease that spreads around the [place] every year, usually between October and May. Flu is caused by influenza viruses, and is spread mainly by coughing, sneezing, and close contact. Anyone can get flu. Flu strikes suddenly and can last several days. Symptoms vary by age, but can include:  fever/chills  sore throat  muscle aches  fatigue  cough  headache  runny or stuffy nose  Flu can also lead to pneumonia and blood infections, and cause diarrhea and seizures in children. If you have a medical condition, such as heart or lung disease, flu can make it worse. Flu is more dangerous for some people. Infants and young children, people 65 years of age and older, pregnant women, and people  with certain health conditions or a weakened immune system are at greatest risk. Each year thousands of people in the [place] die from flu, and many more are hospitalized. Flu vaccine can:  keep you from getting flu,  make flu less severe if you do get it, and  keep you from spreading flu to your family and other people. 2. Inactivated and recombinant flu vaccines A dose of flu vaccine is recommended every flu season. Children 6 months through 8 years of age may need two doses during the same flu season. Everyone else needs only one dose each flu season. Some inactivated flu vaccines contain a very small amount of a mercury-based preservative called thimerosal. Studies have not shown thimerosal in vaccines to be harmful, but flu vaccines that do not contain thimerosal are available. There is no live flu virus in flu shots. They cannot cause the flu. There are many flu viruses, and they are always changing. Each year a new flu vaccine is made to protect against three or four viruses that are likely to cause disease in the upcoming flu season. But even when the vaccine doesn't exactly match these viruses, it may still provide some protection. Flu vaccine cannot prevent:  flu that is caused by a virus not covered by the vaccine, or  illnesses that look like flu but are not.  It takes about 2 weeks for protection to develop after vaccination, and protection lasts through the flu season. 3. Some people should not get this vaccine Tell the person who is giving you the vaccine:  If you have any severe, life-threatening allergies.   If you ever had a life-threatening allergic reaction after a dose of flu vaccine, or have a severe allergy to any part of this vaccine, you may be advised not to get vaccinated. Most, but not all, types of flu vaccine contain a small amount of egg protein.  If you ever had Guillain-Barr Syndrome (also called GBS). Some people with a history of GBS should not get  this vaccine. This should be discussed with your doctor.  If you are not feeling well. It is usually okay to get flu vaccine when you have a mild illness, but you might be asked to come back when you feel better.  4. Risks of a vaccine reaction With any medicine, including vaccines, there is a chance of reactions. These are usually mild and go away on their own, but serious reactions are also possible. Most people who get a flu shot do not have any problems with it. Minor problems following a flu shot include:  soreness, redness, or swelling where the shot was given  hoarseness  sore, red or itchy eyes  cough  fever  aches  headache  itching  fatigue  If these problems occur, they usually begin soon after the shot and last 1 or 2 days. More serious problems following a flu shot can include the following:  There may be a small increased risk of Guillain-Barre Syndrome (GBS) after inactivated flu vaccine. This risk has been estimated at 1 or 2 additional cases per million people vaccinated. This is much lower than the risk of severe complications from flu, which can be prevented by flu vaccine.  Young children who get the flu shot along with pneumococcal vaccine (PCV13) and/or DTaP vaccine at the same time might be slightly more likely to have a seizure caused by fever. Ask your doctor for more information. Tell your doctor if a child who is getting flu vaccine has ever had a seizure.  Problems that could happen after any injected vaccine:  People sometimes faint after a medical procedure, including vaccination. Sitting or lying down for about 15 minutes can help prevent fainting, and injuries caused by a fall. Tell your doctor if you feel dizzy, or have vision changes or ringing in the ears.  Some people get severe pain in the shoulder and have difficulty moving the arm where a shot was given. This happens very rarely.  Any medication can cause a severe allergic reaction. Such  reactions from a vaccine are very rare, estimated at about 1 in a million doses, and would happen within a few minutes to a few hours after the vaccination. As with any medicine, there is a very remote chance of a vaccine causing a serious injury or death. The safety of vaccines is always being monitored. For more information, visit: www.cdc.gov/vaccinesafety/ 5. What if there is a serious reaction? What should I look for? Look for anything that concerns you, such as signs of a severe allergic reaction, very high fever, or unusual behavior. Signs of a severe allergic reaction can include hives, swelling of the face and throat, difficulty breathing, a fast heartbeat, dizziness, and weakness. These would start a few minutes to a few hours after the vaccination. What should I do?  If you think it is a severe allergic reaction or other emergency that can't wait, call 9-1-1 and get the person to the nearest hospital. Otherwise, call your doctor.  Reactions should be reported to the Vaccine Adverse Event Reporting System (VAERS). Your doctor should file this report,   or you can do it yourself through the VAERS web site at www.vaers.hhs.gov, or by calling 1-800-822-7967. ? VAERS does not give medical advice. 6. The National Vaccine Injury Compensation Program The National Vaccine Injury Compensation Program (VICP) is a federal program that was created to compensate people who may have been injured by certain vaccines. Persons who believe they may have been injured by a vaccine can learn about the program and about filing a claim by calling 1-800-338-2382 or visiting the VICP website at www.hrsa.gov/vaccinecompensation. There is a time limit to file a claim for compensation. 7. How can I learn more?  Ask your healthcare provider. He or she can give you the vaccine package insert or suggest other sources of information.  Call your local or state health department.  Contact the Centers for Disease Control  and Prevention (CDC): ? Call 1-800-232-4636 (1-800-CDC-INFO) or ? Visit CDC's website at www.cdc.gov/flu Vaccine Information Statement, Inactivated Influenza Vaccine (11/22/2013) This information is not intended to replace advice given to you by your health care provider. Make sure you discuss any questions you have with your health care provider. Document Released: 01/27/2006 Document Revised: 12/24/2015 Document Reviewed: 12/24/2015 Elsevier Interactive Patient Education  2017 Elsevier Inc.  

## 2018-02-26 NOTE — Patient Instructions (Signed)

## 2018-02-27 NOTE — Progress Notes (Signed)
I was asked to see the patient prior to his treatment today.  His labs were reviewed.  The patient requests a refill of his oxycodone.  Also he states that he continues to have difficulty swallowing and has not heard from anyone regarding the referral that is been made to ENT.  Mr. Youngblood was treated with Keytruda without any issues of concern.  Sandi Mealy, MHS, PA-C Physician Assistant

## 2018-03-02 MED FILL — FUROSEMIDE 40 MG TAB: 40 | 30 days supply | Qty: 30 | Fill #3

## 2018-03-05 ENCOUNTER — Emergency Department (HOSPITAL_COMMUNITY)
Admission: EM | Admit: 2018-03-05 | Discharge: 2018-03-05 | Disposition: A | Discharge status: Home / Self Care | Payer: Medicaid Other | Attending: Emergency Medicine | Admitting: Emergency Medicine

## 2018-03-05 ENCOUNTER — Emergency Department (HOSPITAL_COMMUNITY): Payer: Medicaid Other

## 2018-03-05 ENCOUNTER — Encounter (HOSPITAL_COMMUNITY): Payer: Self-pay | Admitting: Emergency Medicine

## 2018-03-05 DIAGNOSIS — F1210 Cannabis abuse, uncomplicated: Secondary | ICD-10-CM | POA: Diagnosis not present

## 2018-03-05 DIAGNOSIS — R634 Abnormal weight loss: Secondary | ICD-10-CM | POA: Insufficient documentation

## 2018-03-05 DIAGNOSIS — Z87891 Personal history of nicotine dependence: Secondary | ICD-10-CM | POA: Diagnosis not present

## 2018-03-05 DIAGNOSIS — Z923 Personal history of irradiation: Secondary | ICD-10-CM | POA: Insufficient documentation

## 2018-03-05 DIAGNOSIS — I509 Heart failure, unspecified: Secondary | ICD-10-CM | POA: Insufficient documentation

## 2018-03-05 DIAGNOSIS — N182 Chronic kidney disease, stage 2 (mild): Secondary | ICD-10-CM | POA: Diagnosis not present

## 2018-03-05 DIAGNOSIS — Z7982 Long term (current) use of aspirin: Secondary | ICD-10-CM | POA: Diagnosis not present

## 2018-03-05 DIAGNOSIS — Z79899 Other long term (current) drug therapy: Secondary | ICD-10-CM | POA: Diagnosis not present

## 2018-03-05 DIAGNOSIS — F1310 Sedative, hypnotic or anxiolytic abuse, uncomplicated: Secondary | ICD-10-CM | POA: Insufficient documentation

## 2018-03-05 DIAGNOSIS — Z85818 Personal history of malignant neoplasm of other sites of lip, oral cavity, and pharynx: Secondary | ICD-10-CM | POA: Diagnosis not present

## 2018-03-05 DIAGNOSIS — Z9221 Personal history of antineoplastic chemotherapy: Secondary | ICD-10-CM | POA: Insufficient documentation

## 2018-03-05 DIAGNOSIS — I130 Hypertensive heart and chronic kidney disease with heart failure and stage 1 through stage 4 chronic kidney disease, or unspecified chronic kidney disease: Secondary | ICD-10-CM | POA: Diagnosis not present

## 2018-03-05 DIAGNOSIS — R1310 Dysphagia, unspecified: Secondary | ICD-10-CM | POA: Diagnosis present

## 2018-03-05 LAB — BASIC METABOLIC PANEL
ANION GAP: 11 (ref 5–15)
BUN: 15 mg/dL (ref 6–20)
CHLORIDE: 103 mmol/L (ref 98–111)
CO2: 27 mmol/L (ref 22–32)
Calcium: 8.4 mg/dL — ABNORMAL LOW (ref 8.9–10.3)
Creatinine, Ser: 1.59 mg/dL — ABNORMAL HIGH (ref 0.61–1.24)
GFR, EST AFRICAN AMERICAN: 57 mL/min — AB (ref >60)
GFR, EST NON AFRICAN AMERICAN: 49 mL/min — AB (ref >60)
Glucose, Bld: 87 mg/dL (ref 70–99)
POTASSIUM: 3.6 mmol/L (ref 3.5–5.1)
SODIUM: 141 mmol/L (ref 135–145)

## 2018-03-05 LAB — CBC
HCT: 46.3 % (ref 39.0–52.0)
HEMOGLOBIN: 14.2 g/dL (ref 13.0–17.0)
MCH: 28.1 pg (ref 26.0–34.0)
MCHC: 30.7 g/dL (ref 30.0–36.0)
MCV: 91.7 fL (ref 80.0–100.0)
NRBC: 0 % (ref 0.0–0.2)
PLATELETS: 240 10*3/uL (ref 150–400)
RBC: 5.05 MIL/uL (ref 4.22–5.81)
RDW: 15 % (ref 11.5–15.5)
WBC: 11.2 10*3/uL — AB (ref 4.0–10.5)

## 2018-03-05 MED ORDER — SODIUM CHLORIDE 0.9 % IV BOLUS
1000.0000 mL | Freq: Once | INTRAVENOUS | Status: AC
  Administered 2018-03-05: 1000 mL via INTRAVENOUS

## 2018-03-05 MED ORDER — SODIUM CHLORIDE (PF) 0.9 % IJ SOLN
INTRAMUSCULAR | Status: AC
  Filled 2018-03-05: qty 50

## 2018-03-05 MED ORDER — IOPAMIDOL (ISOVUE-300) INJECTION 61%
100.0000 mL | Freq: Once | INTRAVENOUS | Status: AC | PRN
  Administered 2018-03-05: 100 mL via INTRAVENOUS

## 2018-03-05 MED ORDER — DEXAMETHASONE SODIUM PHOSPHATE 10 MG/ML IJ SOLN
10.0000 mg | Freq: Once | INTRAMUSCULAR | Status: AC
  Administered 2018-03-05: 10 mg via INTRAVENOUS
  Filled 2018-03-05: qty 1

## 2018-03-05 MED ORDER — HEPARIN SOD (PORK) LOCK FLUSH 100 UNIT/ML IV SOLN
500.0000 [IU] | Freq: Once | INTRAVENOUS | Status: AC
  Administered 2018-03-05: 500 [IU]
  Filled 2018-03-05: qty 5

## 2018-03-05 MED ORDER — IOPAMIDOL (ISOVUE-300) INJECTION 61%
INTRAVENOUS | Status: AC
  Filled 2018-03-05: qty 100

## 2018-03-05 MED ORDER — PREDNISONE 20 MG PO TABS: 40.0000 mg | ORAL_TABLET | Freq: Every day | ORAL | 0 refills | Status: AC

## 2018-03-05 MED FILL — predniSONE 20 MG TABS: 20 | 5 days supply | Qty: 10 | Fill #0

## 2018-03-05 NOTE — Discharge Instructions (Addendum)
It is VERY important that you follow up with the [facility] Throat Surgeon

## 2018-03-05 NOTE — ED Notes (Signed)
Discharge instructions reviewed with patient. Patient verbalizes understanding. VSS.   

## 2018-03-05 NOTE — ED Provider Notes (Signed)
[facility] DEPT Provider Note   CSN: [id number] Arrival date & time: 03/05/18  1761     History   Chief Complaint Chief Complaint  Patient presents with  . Dysphagia    HPI Mason [name] is a 52 y.o. male.  HPI Patient is a 52 year old male presents the emergency department with complaints of painful swallowing and difficulty swallowing.  He has a history of tonsillar cancer.  He is seen in the oncology clinic and was given a referral to ENT but he has not had an appointment scheduled yet.  He feels like his symptoms have worsened over the past several weeks.  He has lost 40 to 50 pounds.  He reports some congestion and mucus.  No shortness of breath.  No chest pain.  No tongue swelling.  Symptoms are moderate in severity.   Past Medical History:  Diagnosis Date  . Abnormal liver function test   . Acute sinusitis, unspecified 05/18/2015  . Anemia   . Anxiety    mild new dx  . Arthritis    knees,hips  . Bilateral edema of lower extremity   . Complication of anesthesia    Pt stated " my oxygen level was slow in rising."  . Concussion    Hx: in high school x 2  . Constipation   . Dysuria 09/04/2015  . Fever   . Hyperactive gag reflex   . Hypertension   . Hypoglycemia   . Hypokalemia   . Insomnia 06/15/2015  . Knee pain, chronic   . Malnutrition ([place])   . Non-healing surgical wound 05/23/2014  . PEG (percutaneous endoscopic gastrostomy) status ([place])   . Renal failure, acute ([place])   . S/P radiation therapy 08/19/2013-10/15/2013   Right Tonstil and bilateral neck / 70 Gy in 35 fractions to gross disease, 63 Gy in 35 fractions to high risk nodal echelons, and 56 Gy in 35 fractions to intermediate risk nodal echelons  . Severe nausea and vomiting   . Status post chemotherapy    Only received 2 doses due to uncontrolled nausea and acute renal failure  . Tonsillar cancer ([place]) 07/09/13   SCCA of Right Tonsil, recurrent 2016    Patient Active  Problem List   Diagnosis Date Noted  . Goals of care, counseling/discussion 01/29/2018  . Insomnia disorder 01/29/2018  . Candidiasis of mouth and esophagus ([place]) 01/17/2018  . Drug-induced skin rash 01/17/2018  . Yeast infection 01/16/2018  . CHF (congestive heart failure) ([place]) 10/30/2017  . Elevated LFTs   . Restrictive lung disease 10/09/2017  . Emphysema of lung ([place]) 09/18/2017  . Dyspnea and respiratory abnormalities 07/13/2017  . 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 Acquired hypothyroidism 09/06/2016  . Encounter for antineoplastic chemotherapy 08/09/2016  . Debility 07/15/2016  . Cough 06/17/2016  . Nasal congestion 06/17/2016  . Elevated BP without diagnosis of hypertension 06/17/2016  . Poor dentition 05/20/2016  . Drug-induced pneumonitis 02/29/2016  . Sinus congestion 02/08/2016  . Weight loss 01/18/2016  . 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 Dysuria 09/04/2015  . Palliative care by specialist 08/05/2015  . Insomnia 06/15/2015  . Acute gout involving toe of right foot 05/25/2015  . Acute sinusitis, unspecified 05/18/2015  . Pancytopenia due to antineoplastic chemotherapy ([place]) 04/27/2015  . Primary cancer of tonsillar fossa ([place]) 03/20/2015  . Cancer associated pain 02/12/2015  . Lung nodule, multiple 12/30/2014  . 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  . 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 Abdominal wall pain in left upper quadrant 09/30/2014  . Acquired dysphasia 07/30/2014  . 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 Lymphedema of face 06/27/2014  . Dysphagia, oropharyngeal 06/27/2014  . Neck stiffness 06/27/2014  . Throat pain in adult 02/19/2014  . Anemia in neoplastic disease 10/14/2013  . Leukopenia due to antineoplastic chemotherapy ([place]) 10/14/2013  . Renal insufficiency 10/14/2013  . Nausea & vomiting 09/12/2013  . Chronic periodontitis 08/01/2013  . Tonsillar cancer  ([place]) 07/16/2013    Past Surgical History:  Procedure Laterality Date  . LAPAROSCOPIC GASTROSTOMY N/A 08/15/2013   Procedure: LAPAROSCOPIC GASTROSTOMY TUBE PLACEMENT;  Surgeon: [name], MD;  Location: [facility];  Service: General;  Laterality: N/A;  . LYMPH NODE BIOPSY  03/20/14   right neck  . MULTIPLE EXTRACTIONS WITH ALVEOLOPLASTY N/A 08/01/2013   Procedure: Extraction of tooth #'s 1,15,17,31, 32 with alveoloplasty, mandibular left torus reduction, and gross debridement of remaining teeth.;  Surgeon: [name], DDS;  Location: [facility];  Service: Oral Surgery;  Laterality: N/A;  . PORT-A-CATH REMOVAL N/A 05/13/2014   Procedure: REMOVAL of PORT-A-CATH;  Surgeon: [name], MD;  Location: [facility];  Service: General;  Laterality: N/A;  . PORTACATH PLACEMENT Left 08/15/2013   Procedure: INSERTION PORT-A-CATH;  Surgeon: [name], MD;  Location: [facility];  Service: General;  Laterality: Left;        Home Medications    Prior to Admission medications   Medication Sig Start Date End Date Taking? Authorizing Provider  albuterol (PROVENTIL HFA;VENTOLIN HFA) 108 (90 Base) MCG/ACT inhaler Inhale 2 puffs into the lungs every 6 (six) hours as needed for wheezing or shortness of breath. 10/09/17  Yes Rigoberto Noel, MD  aspirin EC 81 MG tablet Take 81 mg by mouth daily.   Yes [provider]  carvedilol (COREG) 3.125 MG tablet Take 1 tablet (3.125 mg total) by mouth 2 (two) times daily with a meal. 01/03/18  Yes Fay Records, MD  fluticasone [facility]) 50 MCG/ACT nasal spray Place 2 sprays into both nostrils daily. 09/06/16  Yes Gorsuch, Ni, MD  furosemide (LASIX) 40 MG tablet Take 1 tablet (40 mg total) by mouth daily. 11/27/17  Yes Fay Records, MD  hydrALAZINE (APRESOLINE) 25 MG tablet Take 1 tablet (25 mg total) by mouth every 8 (eight) hours. 01/03/18  Yes Fay Records, MD  isosorbide mononitrate (IMDUR) 30 MG 24 hr tablet Take 0.5 tablets (15 mg total) by mouth daily.  02/21/18  Yes Fay Records, MD  levothyroxine (SYNTHROID, LEVOTHROID) 25 MCG tablet TAKE 1 TABLET BY MOUTH DAILY BEFORE BREAKFAST. 11/02/17  Yes Gorsuch, Ni, MD  lidocaine-prilocaine (EMLA) cream Apply 1 application topically as needed. 06/15/17  Yes Gorsuch, Ni, MD  methocarbamol (ROBAXIN) 500 MG tablet Take 1 tablet (500 mg total) by mouth every 8 (eight) hours as needed for muscle spasms. 11/02/17  Yes Reyne Dumas, MD  oxyCODONE (ROXICODONE) 15 MG immediate release tablet Take 1 tablet (15 mg total) by mouth every 8 (eight) hours as needed. 02/26/18  Yes Tanner, Lyndon Code., PA-C  potassium chloride (K-DUR) 10 MEQ tablet Take 1 tablet (10 mEq total) by mouth daily. 11/27/17  Yes Fay Records, MD  fluconazole (DIFLUCAN) 100 MG tablet Take 1 tablet (100 mg total) by mouth daily. Patient not taking: Reported on 03/05/2018 01/16/18   [name], MD  predniSONE (DELTASONE) 20 MG tablet Take 2 tablets (40 mg total) by mouth daily  for 5 days. 03/05/18 03/10/18  Jola Schmidt, MD    Family History Family History  Problem Relation Age of Onset  . Arthritis Mother   . Heart disease Mother   . Arthritis Father   . CVA Unknown   . Diabetes Unknown   . Heart attack Unknown   . Heart disease Maternal Grandmother   . CVA Maternal Grandmother     Social History Social History   Tobacco Use  . Smoking status: Former Smoker    Packs/day: 1.00    Years: 20.00    Pack years: 20.00    Types: Cigarettes    Last attempt to quit: 07/16/2003    Years since quitting: 14.6  . Smokeless tobacco: Never Used  Substance Use Topics  . Alcohol use: No    Comment: none years ago  . Drug use: Yes    Types: Marijuana, Benzodiazepines    Comment: years ago     Allergies   Bee pollen and Pollen extract   Review of Systems Review of Systems  All other systems reviewed and are negative.    Physical Exam Updated Vital Signs BP (!) 138/92   Pulse 73   Temp 98.6 F (37 C) (Oral)   Resp 16   SpO2 100%     Physical Exam  Constitutional: He is oriented to person, place, and time. He appears well-developed and well-nourished.  HENT:  Head: Normocephalic and atraumatic.  Tongue is normal.  Posterior pharynx appears normal.  Mild fullness of the anterior neck likely consistent with prior radiation  Eyes: EOM are normal.  Neck: Normal range of motion.  Cardiovascular: Normal rate, regular rhythm, normal heart sounds and intact distal pulses.  Pulmonary/Chest: Effort normal and breath sounds normal. No respiratory distress.  Abdominal: Soft. He exhibits no distension. There is no tenderness.  Musculoskeletal: Normal range of motion.  Neurological: He is alert and oriented to person, place, and time.  Skin: Skin is warm and dry.  Psychiatric: He has a normal mood and affect. Judgment normal.  Nursing note and vitals reviewed.    ED Treatments / Results  Labs (all labs ordered are listed, but only abnormal results are displayed) Labs Reviewed  CBC - Abnormal; Notable for the following components:      Result Value   WBC 11.2 (*)    All other components within normal limits  BASIC METABOLIC PANEL - Abnormal; Notable for the following components:   Creatinine, Ser 1.59 (*)    Calcium 8.4 (*)    GFR calc non Af Amer 49 (*)    GFR calc Af Amer 57 (*)    All other components within normal limits    EKG None  Radiology Ct Soft Tissue Neck W Contrast  Result Date: 03/05/2018 CLINICAL DATA:  History of tonsil cancer with sore throat and difficulty swallowing for 2 months. EXAM: CT NECK WITH CONTRAST TECHNIQUE: Multidetector CT imaging of the neck was performed using the standard protocol following the bolus administration of intravenous contrast. CONTRAST:  173mL ISOVUE-300 IOPAMIDOL (ISOVUE-300) INJECTION 61% COMPARISON:  01/26/2018 FINDINGS: Pharynx and larynx: Mucosal hyperenhancement and submucosal low-density expansion throughout the nasopharynx, oropharynx, hypopharynx, and  supraglottic larynx. Although this is commonly seen after head and neck cancer therapy, there has been progressive thickening since scan approximately 1 month prior. No discrete masslike area. No airway narrowing. Salivary glands: Post treatment atrophy of the submandibular glands. No acute finding Thyroid: Negative. Lymph nodes: Decreasing amorphous soft tissue density in the postoperative lateral  right neck, indistinguishable from the carotid sheath, low density, previously regressive, and still most likely scarring. Treated calcified adjacent lymph node. No enlarged or low density lymph node. Vascular: EJ sacrifice on the right. Partial right external carotid sacrifice. Limited intracranial: Negative Visualized orbits: Minimal coverage is negative Mastoids and visualized paranasal sinuses: Minor mucosal thickening in the right more than left maxillary sinuses. Skeleton: No acute or aggressive finding.  Spondylosis. Upper chest: No acute finding.  Mild biapical radiation fibrosis. IMPRESSION: 1. Mucosal hyperenhancement and submucosal edema in the aero digestive tract, commonly seen after head and neck cancer therapy but definitely progressed since scan last month and there may be a superimposed acute pharyngitis and supraglottic laryngitis. No abscess or airway narrowing. 2. Soft tissue in the postsurgical right lateral neck continues to favor scarring. Electronically Signed   By: Monte Fantasia M.D.   On: 03/05/2018 12:11    Procedures Procedures (including critical care time)  Medications Ordered in ED Medications  iopamidol (ISOVUE-300) 61 % injection (has no administration in time range)  sodium chloride (PF) 0.9 % injection (has no administration in time range)  sodium chloride 0.9 % bolus 1,000 mL (0 mLs Intravenous Stopped 03/05/18 1256)  iopamidol (ISOVUE-300) 61 % injection 100 mL (100 mLs Intravenous Contrast Given 03/05/18 1112)  dexamethasone (DECADRON) injection 10 mg (10 mg Intravenous  Given 03/05/18 1256)     Initial Impression / Assessment and Plan / ED Course  I have reviewed the triage vital signs and the nursing notes.  Pertinent labs & imaging results that were available during my care of the patient were reviewed by me and considered in my medical decision making (see chart for details).     Symptom improvement with Decadron.  CT scan demonstrates no obvious lesion and does demonstrate an oral patent airway.  There is some subtle inflammation which will need to be better visualized under direct nasopharyngoscopy versus laryngoscopy.  Referral given to ENT.  He understands the importance of close ENT follow-up.  Home a short course of prednisone to help with swelling.  Tolerating fluids in the emergency department.  Final Clinical Impressions(s) / ED Diagnoses   Final diagnoses:  Dysphagia, unspecified type    ED Discharge Orders         Ordered    predniSONE (DELTASONE) 20 MG tablet  Daily     03/05/18 1335           Jola Schmidt, MD 03/05/18 1338

## 2018-03-05 NOTE — ED Triage Notes (Signed)
Per pt, states has been on chemo for over a year for throat cancer-diffculty swallowing for about 2 months-states oncologist states it is due to scar tissue-states symptoms have increased the past couple of weeks-states he has lost 50 lbs-feels like food is getting stuck in throat-states he also has some congestion, mucous

## 2018-03-09 ENCOUNTER — Other Ambulatory Visit: Payer: Self-pay | Admitting: Otolaryngology

## 2018-03-09 DIAGNOSIS — C099 Malignant neoplasm of tonsil, unspecified: Secondary | ICD-10-CM

## 2018-03-12 ENCOUNTER — Ambulatory Visit
Admission: RE | Admit: 2018-03-12 | Discharge: 2018-03-12 | Disposition: A | Discharge status: Home / Self Care | Payer: Medicaid Other | Source: Ambulatory Visit | Attending: Otolaryngology | Admitting: Otolaryngology

## 2018-03-12 DIAGNOSIS — C099 Malignant neoplasm of tonsil, unspecified: Secondary | ICD-10-CM

## 2018-03-15 MED FILL — hydrALAZINE HCL 25 MG TABS: 25 | 30 days supply | Qty: 90 | Fill #2

## 2018-03-16 MED FILL — CARVEDILOL 3.125 MG TABLET: 3.125 | 30 days supply | Qty: 60 | Fill #3

## 2018-03-19 ENCOUNTER — Other Ambulatory Visit: Payer: Self-pay | Admitting: Hematology and Oncology

## 2018-03-19 DIAGNOSIS — Z5111 Encounter for antineoplastic chemotherapy: Secondary | ICD-10-CM

## 2018-03-19 DIAGNOSIS — C099 Malignant neoplasm of tonsil, unspecified: Secondary | ICD-10-CM

## 2018-03-26 ENCOUNTER — Inpatient Hospital Stay: Payer: Medicaid Other

## 2018-03-26 ENCOUNTER — Telehealth: Payer: Self-pay | Admitting: Hematology and Oncology

## 2018-03-26 ENCOUNTER — Inpatient Hospital Stay: Payer: Medicaid Other | Attending: Hematology and Oncology

## 2018-03-26 ENCOUNTER — Inpatient Hospital Stay (HOSPITAL_BASED_OUTPATIENT_CLINIC_OR_DEPARTMENT_OTHER): Payer: Medicaid Other | Admitting: Hematology and Oncology

## 2018-03-26 ENCOUNTER — Encounter: Payer: Self-pay | Admitting: Hematology and Oncology

## 2018-03-26 DIAGNOSIS — L270 Generalized skin eruption due to drugs and medicaments taken internally: Secondary | ICD-10-CM | POA: Insufficient documentation

## 2018-03-26 DIAGNOSIS — R634 Abnormal weight loss: Secondary | ICD-10-CM

## 2018-03-26 DIAGNOSIS — F064 Anxiety disorder due to known physiological condition: Secondary | ICD-10-CM

## 2018-03-26 DIAGNOSIS — R1312 Dysphagia, oropharyngeal phase: Secondary | ICD-10-CM

## 2018-03-26 DIAGNOSIS — G893 Neoplasm related pain (acute) (chronic): Secondary | ICD-10-CM | POA: Insufficient documentation

## 2018-03-26 DIAGNOSIS — Z923 Personal history of irradiation: Secondary | ICD-10-CM | POA: Diagnosis not present

## 2018-03-26 DIAGNOSIS — R070 Pain in throat: Secondary | ICD-10-CM

## 2018-03-26 DIAGNOSIS — Z9221 Personal history of antineoplastic chemotherapy: Secondary | ICD-10-CM | POA: Diagnosis not present

## 2018-03-26 DIAGNOSIS — C090 Malignant neoplasm of tonsillar fossa: Secondary | ICD-10-CM | POA: Insufficient documentation

## 2018-03-26 DIAGNOSIS — I959 Hypotension, unspecified: Secondary | ICD-10-CM | POA: Insufficient documentation

## 2018-03-26 DIAGNOSIS — Z7982 Long term (current) use of aspirin: Secondary | ICD-10-CM | POA: Diagnosis not present

## 2018-03-26 DIAGNOSIS — M79673 Pain in unspecified foot: Secondary | ICD-10-CM

## 2018-03-26 DIAGNOSIS — Z79899 Other long term (current) drug therapy: Secondary | ICD-10-CM | POA: Diagnosis not present

## 2018-03-26 DIAGNOSIS — C099 Malignant neoplasm of tonsil, unspecified: Secondary | ICD-10-CM

## 2018-03-26 DIAGNOSIS — C770 Secondary and unspecified malignant neoplasm of lymph nodes of head, face and neck: Secondary | ICD-10-CM

## 2018-03-26 DIAGNOSIS — Z5111 Encounter for antineoplastic chemotherapy: Secondary | ICD-10-CM

## 2018-03-26 MED ORDER — ALPRAZOLAM 0.5 MG PO TABS: 0.5000 mg | ORAL_TABLET | Freq: Two times a day (BID) | ORAL | 0 refills | Status: DC | PRN

## 2018-03-26 MED ORDER — MIRTAZAPINE 15 MG PO TABS: 15.0000 mg | ORAL_TABLET | Freq: Every day | ORAL | 1 refills | Status: DC

## 2018-03-26 MED ORDER — PREDNISONE 20 MG PO TABS: 40.0000 mg | ORAL_TABLET | Freq: Every day | ORAL | 0 refills | Status: DC

## 2018-03-26 MED ORDER — SODIUM CHLORIDE 0.9 % IJ SOLN
10.0000 mL | INTRAMUSCULAR | Status: DC | PRN
  Filled 2018-03-26: qty 10

## 2018-03-26 MED ORDER — OXYCODONE HCL 15 MG PO TABS: 15.0000 mg | ORAL_TABLET | Freq: Three times a day (TID) | ORAL | 0 refills | Status: DC | PRN

## 2018-03-26 MED FILL — MIRTAZAPINE 15 MG TABLET: 15 | 30 days supply | Qty: 30 | Fill #0

## 2018-03-26 MED FILL — predniSONE 20 MG TABS: 20 | 30 days supply | Qty: 60 | Fill #0

## 2018-03-26 MED FILL — ALPRAZolam 0.5 MG TABS: 0.5 | 30 days supply | Qty: 60 | Fill #0

## 2018-03-26 MED FILL — FUROSEMIDE 40 MG TAB: 40 | 30 days supply | Qty: 30 | Fill #4

## 2018-03-26 MED FILL — oxyCODONE HCL 15 MG TABS: 15 | 30 days supply | Qty: 90 | Fill #0

## 2018-03-26 NOTE — Assessment & Plan Note (Signed)
He has significant dysphasia, likely due to stricture from prior radiation He is able to tolerate liquid diet Previously, while on prednisone therapy, his swallowing did improve For now, I do not recommend feeding tube placement I recommend him to continue liquid diet and occasional soft diet as tolerated

## 2018-03-26 NOTE — Progress Notes (Signed)
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 recurrence However, he is experiencing multiple side effects of treatment We will put pembrolizumab on hold until his side effects resolve  Drug-induced skin rash He has worsening skin rashes I plan to restart prednisone therapy at 40 mg daily and reassess next week He is reminded to put topical emollient cream  Dysphagia, oropharyngeal He has significant dysphasia, likely due to stricture from prior radiation He is able to tolerate liquid diet Previously, while on prednisone therapy, his swallowing did improve For now, I do not recommend feeding tube placement I recommend him to continue liquid diet and occasional soft diet as tolerated  Weight loss He has profound weight loss since the last time I saw him His blood pressure is running low and he is complaining of dizziness I have discontinued most of his blood pressure medication except carvedilol He has no signs or symptoms of congestive heart failure  Throat pain in adult He has chronic throat pain related to prior treatment I refilled his prescription pain medicine today  Anxiety disorder due to medical condition He has significant anxiety disorder and fear of death with recent side effects of treatment I recommend restarting him back on Remeron that might help with weight gain and appetite I gave him a small prescription of Xanax to take as needed I warned him about risk of excessive sedation while on pain medicine and Xanax   No orders of  the defined types were placed in this encounter.   INTERVAL HISTORY: Please see below for problem oriented charting. The patient is tearful today. Since the last time I saw him, he was evaluated at the ENT service several times.  He had swallow evaluation and laryngoscopy which revealed no evidence of cancer recurrence He has significant dysphagia and has lost over 20 pounds since the last time I saw him He is able to tolerate liquid diet He is fearful of death.  He has significant anxiety and not able to work.  He ran out of his pain medicine recently and did not get his pain medicine refill and complained of severe throat pain He denies suicidal ideation He also have significant worsening skin rash since his last treatment His skin is itchy.  It did improve while on prednisone therapy. He denies recent cough, chest pain or shortness of breath.  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 FNA of right level III lymph node was inconclusive for cancer    07/25/2013 Imaging    PET scan showed locally advanced disease with abnormal lymphadenopathy in the right axilla    08/06/2013 Procedure    CT-guided biopsy of the  lymphadenopathy was negative for malignancy    08/15/2013 Surgery    Patient has placement of port and feeding tube    08/19/2013 - 09/10/2013 Chemotherapy    Patient received chemotherapy with cisplatin. The patient only received 2 doses due to uncontrolled nausea and acute renal failure.    08/19/2013 - 10/15/2013 Radiation Therapy    Received Helical IMRT Tomotherapy:  Right Tonstil and  bilateral neck / 70 Gy in 35 fractions to gross disease, 63 Gy in 35 fractions to high risk nodal echelons, and 56 Gy in 35 fractions to intermediate risk nodal echelons.    08/27/2013 - 08/30/2013 Hospital Admission    The patient was admitted to the hospital for uncontrolled nausea vomiting and dehydration.    02/14/2014 Imaging    PET/CT scan showed complete response to treatment    03/19/2014 Surgery    He had excisional lymph node biopsy from the right neck. Pathology was benign    05/13/2014 Surgery    He had removal of Port-A-Cath.    05/22/2014 Imaging    Repeat CT scan of the neck show no evidence of disease recurrence.    12/09/2014 Imaging    Ct neck without contrast showed persistent abnormalities on the right side of neck, indeterminate    12/25/2014 Imaging    PET CT scan showed disease recurrence.    02/10/2015 Procedure    He has placement of port    02/13/2015 - 07/13/2015 Chemotherapy    He received chemotherapy with carbo/Taxol    04/21/2015 Imaging    PET CT scan showed improved disease control    08/04/2015 Imaging    PET scan showed persistent disease    08/17/2015 -  Chemotherapy    He was started with [facility]    10/19/2015 Imaging    Ct neck showed mass-like intermediate density soft tissue at the right lateral neck recurrence site stable.     02/26/2016 Imaging    CT neck showed unchanged appearance of right neck recurrence compared to 10/19/2015 CT. No noncontrast evidence of new metastatic disease in the neck. Chronic sinusitis, progressed.    02/26/2016 Imaging    Diffuse but patchy and asymmetric partial airspace filling process (ground-glass opacity) in the lungs. This could be due to respiratory bronchiolitis, hypersensitivity pneumonitis or possible drug reaction. Atypical/viral pneumonia is also possible. Pulmonary consultation may be a helpful. A three-month follow-up noncontrast chest CT is suggested. Slight interval enlargement of mediastinal lymph  nodes and a small lymph node along the left major fissure. This is most likely due to the inflammatory process in the lungs. No findings for metastatic disease involving the chest. No findings for upper abdominal metastatic disease.    02/29/2016 Adverse Reaction    His treatment is placed on hold due to possible hypersensitivity pneumonitis/drug reaction    04/14/2016 Imaging    Ct chest showed no evidence for metastatic disease within the chest. Significant interval improvement and near complete resolution of previously described diffuse bilateral predominately ground-glass pulmonary opacities, most compatible with resolving infectious/inflammatory process.    09/05/2016 Imaging    CT scan of neck and chest  1. Unchanged appearance of right neck recurrence. 2. No evidence of new metastatic disease in the neck. 3. Unremarkable and stable CT appearance of the chest. No findings suspicious for metastatic disease    07/24/2017 Imaging    1. No findings suspicious for metastatic disease in the chest. 2. No acute consolidative airspace disease to suggest a pneumonia. 3. No appreciable  change in chronic mild patchy upper lung predominant centrilobular micronodularity. If the patient is a current smoker, these findings are most compatible with smoking related interstitial lung disease. If the patient is not a current smoker, these findings suggest subacute hypersensitivity pneumonitis  or postinflammatory change. 4. Stable mild biapical radiation fibrosis. 5. Mild to moderate centrilobular emphysema with diffuse bronchial wall thickening, suggesting COPD. 6. One vessel coronary atherosclerosis.  Aortic Atherosclerosis (ICD10-I70.0) and Emphysema (ICD10-J43.9).    09/15/2017 Imaging    1. No evidence of interstitial lung disease. 2. Emphysema (ICD10-J43.9).    10/29/2017 Imaging    1. Moderate quality exam for pulmonary embolism with primary limitation of motion. No embolism identified. 2.  Findings of congestive heart failure, including bilateral pleural effusions and septal thickening. 3. New thoracic adenopathy since approximately 6 weeks ago. Favor secondary to fluid overload/congestive heart failure. 4. New left apical 4 mm pulmonary nodule. Favored to represent a subpleural lymph node. Non-contrast chest CT can be considered in 12 months, given risk factors for primary bronchogenic carcinoma. This recommendation follows the consensus statement: Guidelines for Management of Incidental Pulmonary Nodules Detected on CT Images: From the Fleischner Society 2017; Radiology 2017; 409:811-914.     10/29/2017 - 11/02/2017 Hospital Admission    He was admitted to the hospital due to shortness of breath and was found to have congestive heart failure.    10/30/2017 Imaging    Definity used; severe global reduction in LV systolic function; severe LVE; restrictive filling; mild MR; mild LAE; mild RVE with moderate RV dysfunction; mild TR with moderate pulmonary hypertension.    01/26/2018 Imaging    1. Regression of soft tissue in the postoperative right neck favoring scarring. No new or progressive finding to suggest recurrent disease. 2. Chest CT reported separately    01/26/2018 Imaging    1. No evidence of thoracic metastasis. 2. Port in the anterior chest wall with tip in distal     03/12/2018 Imaging    1. Trace, silent aspiration noted after a swallow consistent with aspiration of residual barium in the hypopharynx. 2. Retrograde reflux of contrast into the posterior nasopharynx with swallowing. 3. No gross esophageal abnormality 4. Initial difficulty swallowing a 13 mm barium tablet that than passes readily into the stomach once it is swallowed.     REVIEW OF SYSTEMS:   Constitutional: Denies fevers, chills  Eyes: Denies blurriness of vision Ears, nose, mouth, throat, and face: Denies mucositis or sore throat Respiratory: Denies cough, dyspnea or wheezes Cardiovascular:  Denies palpitation, chest discomfort or lower extremity swelling Gastrointestinal:  Denies nausea, heartburn or change in bowel habits Lymphatics: Denies new lymphadenopathy or easy bruising Neurological:Denies numbness, tingling or new weaknesses All other systems were reviewed with the patient and are negative.  I have reviewed the past medical history, past surgical history, social history and family history with the patient and they are unchanged from previous note.  ALLERGIES:  is allergic to bee pollen and pollen extract.  MEDICATIONS:  Current Outpatient Medications  Medication Sig Dispense Refill  . albuterol (PROVENTIL HFA;VENTOLIN HFA) 108 (90 Base) MCG/ACT inhaler Inhale 2 puffs into the lungs every 6 (six) hours as needed for wheezing or shortness of breath. 1 Inhaler 6  . ALPRAZolam (XANAX) 0.5 MG tablet Take 1 tablet (0.5 mg total) by mouth 2 (two) times daily as needed for anxiety. 60 tablet 0  . aspirin EC 81 MG tablet Take 81 mg by mouth daily.    . carvedilol (COREG) 3.125 MG  tablet Take 1 tablet (3.125 mg total) by mouth 2 (two) times daily with a meal. 60 tablet 3  . fluticasone (FLONASE) 50 MCG/ACT nasal spray Place 2 sprays into both nostrils daily. 16 g 2  . levothyroxine (SYNTHROID, LEVOTHROID) 25 MCG tablet TAKE 1 TABLET BY MOUTH DAILY BEFORE BREAKFAST. 90 tablet 1  . lidocaine-prilocaine (EMLA) cream Apply 1 application topically as needed. 30 g 6  . methocarbamol (ROBAXIN) 500 MG tablet Take 1 tablet (500 mg total) by mouth every 8 (eight) hours as needed for muscle spasms. 30 tablet 0  . mirtazapine (REMERON) 15 MG tablet Take 1 tablet (15 mg total) by mouth at bedtime. 30 tablet 1  . oxyCODONE (ROXICODONE) 15 MG immediate release tablet Take 1 tablet (15 mg total) by mouth every 8 (eight) hours as needed. 90 tablet 0  . predniSONE (DELTASONE) 20 MG tablet Take 2 tablets (40 mg total) by mouth daily with breakfast. 90 tablet 0   No current facility-administered  medications for this visit.    Facility-Administered Medications Ordered in Other Visits  Medication Dose Route Frequency Provider Last Rate Last Dose  . sodium chloride 0.9 % injection 10 mL  10 mL Intravenous PRN Alvy Bimler, Aracelis Ulrey, MD   10 mL at 11/29/16 0818  . sodium chloride 0.9 % injection 10 mL  10 mL Intravenous PRN Alvy Bimler, Oliwia Berzins, MD        PHYSICAL EXAMINATION: ECOG PERFORMANCE STATUS: 2 - Symptomatic, <50% confined to bed  Vitals:   03/26/18 1042  BP: 106/65  Pulse: 92  Resp: 18  Temp: 98.7 F (37.1 C)  SpO2: 100%   Filed Weights   03/26/18 1042  Weight: 210 lb (95.3 kg)    GENERAL:alert, no distress and comfortable.  He has lost a lot of weight since last time I saw him.  He is tearful. SKIN: He has significant skin rash with mild ulceration.  No active bleeding EYES: normal, Conjunctiva are pink and non-injected, sclera clear OROPHARYNX:no exudate, no erythema and lips, buccal mucosa, and tongue normal  NECK: supple, thyroid normal size, non-tender, without nodularity LYMPH:  no palpable lymphadenopathy in the cervical, axillary or inguinal LUNGS: clear to auscultation and percussion with normal breathing effort HEART: regular rate & rhythm and no murmurs and no lower extremity edema ABDOMEN:abdomen soft, non-tender and normal bowel sounds Musculoskeletal:no cyanosis of digits and no clubbing  NEURO: alert & oriented x 3 with fluent speech, no focal motor/sensory deficits  LABORATORY DATA:  I have reviewed the data as listed    Component Value Date/Time   NA 141 03/05/2018 1012   NA 141 04/20/2017 1028   K 3.6 03/05/2018 1012   K 4.3 04/20/2017 1028   CL 103 03/05/2018 1012   CO2 27 03/05/2018 1012   CO2 26 04/20/2017 1028   GLUCOSE 87 03/05/2018 1012   GLUCOSE 96 04/20/2017 1028   BUN 15 03/05/2018 1012   BUN 16.7 04/20/2017 1028   CREATININE 1.59 (H) 03/05/2018 1012   CREATININE 1.81 (H) 02/15/2018 1501   CREATININE 2.0 (H) 04/20/2017 1028   CALCIUM 8.4  (L) 03/05/2018 1012   CALCIUM 9.0 04/20/2017 1028   PROT 6.7 02/26/2018 0913   PROT 7.0 04/20/2017 1028   ALBUMIN 3.5 02/26/2018 0913   ALBUMIN 4.0 04/20/2017 1028   AST 17 02/26/2018 0913   AST 27 02/15/2018 1501   AST 20 04/20/2017 1028   ALT 17 02/26/2018 0913   ALT 16 02/15/2018 1501   ALT 17 04/20/2017 1028  ALKPHOS 61 02/26/2018 0913   ALKPHOS 61 04/20/2017 1028   BILITOT 0.7 02/26/2018 0913   BILITOT 0.6 02/15/2018 1501   BILITOT 0.49 04/20/2017 1028   GFRNONAA 49 (L) 03/05/2018 1012   GFRNONAA 42 (L) 02/15/2018 1501   GFRAA 57 (L) 03/05/2018 1012   GFRAA 49 (L) 02/15/2018 1501    No results found for: SPEP, UPEP  Lab Results  Component Value Date   WBC 11.2 (H) 03/05/2018   NEUTROABS 9.5 (H) 02/26/2018   HGB 14.2 03/05/2018   HCT 46.3 03/05/2018   MCV 91.7 03/05/2018   PLT 240 03/05/2018      Chemistry      Component Value Date/Time   NA 141 03/05/2018 1012   NA 141 04/20/2017 1028   K 3.6 03/05/2018 1012   K 4.3 04/20/2017 1028   CL 103 03/05/2018 1012   CO2 27 03/05/2018 1012   CO2 26 04/20/2017 1028   BUN 15 03/05/2018 1012   BUN 16.7 04/20/2017 1028   CREATININE 1.59 (H) 03/05/2018 1012   CREATININE 1.81 (H) 02/15/2018 1501   CREATININE 2.0 (H) 04/20/2017 1028      Component Value Date/Time   CALCIUM 8.4 (L) 03/05/2018 1012   CALCIUM 9.0 04/20/2017 1028   ALKPHOS 61 02/26/2018 0913   ALKPHOS 61 04/20/2017 1028   AST 17 02/26/2018 0913   AST 27 02/15/2018 1501   AST 20 04/20/2017 1028   ALT 17 02/26/2018 0913   ALT 16 02/15/2018 1501   ALT 17 04/20/2017 1028   BILITOT 0.7 02/26/2018 0913   BILITOT 0.6 02/15/2018 1501   BILITOT 0.49 04/20/2017 1028       RADIOGRAPHIC STUDIES: I have personally reviewed the radiological images as listed and agreed with the findings in the report. Ct Soft Tissue Neck W Contrast  Result Date: 03/05/2018 CLINICAL DATA:  History of tonsil cancer with sore throat and difficulty swallowing for 2 months.  EXAM: CT NECK WITH CONTRAST TECHNIQUE: Multidetector CT imaging of the neck was performed using the standard protocol following the bolus administration of intravenous contrast. CONTRAST:  124mL ISOVUE-300 IOPAMIDOL (ISOVUE-300) INJECTION 61% COMPARISON:  01/26/2018 FINDINGS: Pharynx and larynx: Mucosal hyperenhancement and submucosal low-density expansion throughout the nasopharynx, oropharynx, hypopharynx, and supraglottic larynx. Although this is commonly seen after head and neck cancer therapy, there has been progressive thickening since scan approximately 1 month prior. No discrete masslike area. No airway narrowing. Salivary glands: Post treatment atrophy of the submandibular glands. No acute finding Thyroid: Negative. Lymph nodes: Decreasing amorphous soft tissue density in the postoperative lateral right neck, indistinguishable from the carotid sheath, low density, previously regressive, and still most likely scarring. Treated calcified adjacent lymph node. No enlarged or low density lymph node. Vascular: EJ sacrifice on the right. Partial right external carotid sacrifice. Limited intracranial: Negative Visualized orbits: Minimal coverage is negative Mastoids and visualized paranasal sinuses: Minor mucosal thickening in the right more than left maxillary sinuses. Skeleton: No acute or aggressive finding.  Spondylosis. Upper chest: No acute finding.  Mild biapical radiation fibrosis. IMPRESSION: 1. Mucosal hyperenhancement and submucosal edema in the aero digestive tract, commonly seen after head and neck cancer therapy but definitely progressed since scan last month and there may be a superimposed acute pharyngitis and supraglottic laryngitis. No abscess or airway narrowing. 2. Soft tissue in the postsurgical right lateral neck continues to favor scarring. Electronically Signed   By: Monte Fantasia M.D.   On: 03/05/2018 12:11   Dg Esophagus  Result Date: 03/12/2018  CLINICAL DATA:  Tonsillar cancer.   Dysphagia. EXAM: ESOPHOGRAM/BARIUM SWALLOW TECHNIQUE: Single contrast examination was performed using  thin barium. FLUOROSCOPY TIME:  Fluoroscopy Time:  1 minutes and 48 seconds Radiation Exposure Index (if provided by the fluoroscopic device): 156 mGy Number of Acquired Spot Images: 0 COMPARISON:  None. FINDINGS: Lateral view of the oropharynx with swallowing shows reflux of contrast material into the posterior nasopharynx. Frontal projection reveals mass-effect on the left hypopharynx with displacement of hypopharyngeal anatomy to the right. Upon repositioning into a lateral projection, the patient was noted to have barium coating the anterior wall the trachea. No cough reflex was evident. Single contrast upright evaluation of the esophagus reveals no gross esophageal mass lesion, diverticulum, or gross esophageal stricture. Patient had some difficulty with initial swallowing of a 13 mm barium tablet, but once of with swallowed and passed readily into the stomach. IMPRESSION: 1. Trace, silent aspiration noted after a swallow consistent with aspiration of residual barium in the hypopharynx. 2. Retrograde reflux of contrast into the posterior nasopharynx with swallowing. 3. No gross esophageal abnormality 4. Initial difficulty swallowing a 13 mm barium tablet that than passes readily into the stomach once it is swallowed. Electronically Signed   By: Misty Stanley M.D.   On: 03/12/2018 11:01    All questions were answered. The patient knows to call the clinic with any problems, questions or concerns. No barriers to learning was detected.  I spent 30 minutes counseling the patient face to face. The total time spent in the appointment was 40 minutes and more than 50% was on counseling and review of test results  [name], MD 03/26/2018 1:24 PM

## 2018-03-26 NOTE — Assessment & Plan Note (Signed)
He has chronic throat pain related to prior treatment I refilled his prescription pain medicine today

## 2018-03-26 NOTE — Telephone Encounter (Signed)
Patient did not want avs or calendar.

## 2018-03-26 NOTE — Assessment & Plan Note (Signed)
He has profound weight loss since the last time I saw him His blood pressure is running low and he is complaining of dizziness I have discontinued most of his blood pressure medication except carvedilol He has no signs or symptoms of congestive heart failure

## 2018-03-26 NOTE — Assessment & Plan Note (Signed)
He has significant anxiety disorder and fear of death with recent side effects of treatment I recommend restarting him back on Remeron that might help with weight gain and appetite I gave him a small prescription of Xanax to take as needed I warned him about risk of excessive sedation while on pain medicine and Xanax

## 2018-03-26 NOTE — Assessment & Plan Note (Signed)
He has worsening skin rashes I plan to restart prednisone therapy at 40 mg daily and reassess next week He is reminded to put topical emollient cream

## 2018-03-26 NOTE — Assessment & Plan Note (Signed)
Recent ENT evaluation and imaging study showed no evidence of cancer recurrence However, he is experiencing multiple side effects of treatment We will put pembrolizumab on hold until his side effects resolve

## 2018-04-06 ENCOUNTER — Encounter: Payer: Self-pay | Admitting: Hematology and Oncology

## 2018-04-06 ENCOUNTER — Inpatient Hospital Stay (HOSPITAL_BASED_OUTPATIENT_CLINIC_OR_DEPARTMENT_OTHER): Payer: Medicaid Other | Admitting: Hematology and Oncology

## 2018-04-06 DIAGNOSIS — C099 Malignant neoplasm of tonsil, unspecified: Secondary | ICD-10-CM

## 2018-04-06 DIAGNOSIS — Z7982 Long term (current) use of aspirin: Secondary | ICD-10-CM

## 2018-04-06 DIAGNOSIS — Z9221 Personal history of antineoplastic chemotherapy: Secondary | ICD-10-CM

## 2018-04-06 DIAGNOSIS — L270 Generalized skin eruption due to drugs and medicaments taken internally: Secondary | ICD-10-CM

## 2018-04-06 DIAGNOSIS — Z923 Personal history of irradiation: Secondary | ICD-10-CM

## 2018-04-06 DIAGNOSIS — G893 Neoplasm related pain (acute) (chronic): Secondary | ICD-10-CM | POA: Diagnosis not present

## 2018-04-06 DIAGNOSIS — R1312 Dysphagia, oropharyngeal phase: Secondary | ICD-10-CM

## 2018-04-06 DIAGNOSIS — R634 Abnormal weight loss: Secondary | ICD-10-CM

## 2018-04-06 DIAGNOSIS — C090 Malignant neoplasm of tonsillar fossa: Secondary | ICD-10-CM | POA: Diagnosis not present

## 2018-04-06 DIAGNOSIS — M79673 Pain in unspecified foot: Secondary | ICD-10-CM

## 2018-04-06 DIAGNOSIS — Z79899 Other long term (current) drug therapy: Secondary | ICD-10-CM

## 2018-04-06 NOTE — Progress Notes (Signed)
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  Cancer associated pain The patient took a few more oxycodone recently for foot pain.  I recommend the patient to refrain from doing so.  I do not plan to refill his prescription sooner than he is prescribed  Dysphagia, oropharyngeal His dysphagia has improved while on prednisone therapy. He has gained over 12 pounds of weight since last time I saw him.  I recommend soft diet as tolerated and will continue weaning effort of prednisone   No orders of the defined types were placed in this encounter.   INTERVAL HISTORY: Please see below for problem oriented charting. He returns for further follow-up His skin rash has drastically improved Dysphagia also has improved.  He has gained 12 pounds since last time I saw him He complained of recent foot pain and took a bit more oxycodone than prescribed.  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 FNA of right level III lymph node was inconclusive for cancer    07/25/2013 Imaging    PET scan showed locally advanced disease with abnormal lymphadenopathy in the right axilla    08/06/2013 Procedure    CT-guided biopsy of the lymphadenopathy was negative for malignancy    08/15/2013 Surgery    Patient has placement of port and feeding tube    08/19/2013 - 09/10/2013 Chemotherapy    Patient received chemotherapy with cisplatin. The patient only received 2 doses due to uncontrolled nausea and acute renal failure.    08/19/2013 - 10/15/2013 Radiation Therapy    Received Helical IMRT Tomotherapy:  Right Tonstil and bilateral neck / 70 Gy in 35 fractions to gross disease, 63 Gy in 35 fractions to high risk nodal echelons, and 56 Gy in 35 fractions to intermediate risk nodal echelons.    08/27/2013 - 08/30/2013 Hospital Admission    The patient was admitted to the hospital for uncontrolled nausea vomiting and dehydration.    02/14/2014 Imaging    PET/CT scan showed complete response to treatment    03/19/2014 Surgery    He had excisional lymph node biopsy from the right neck. Pathology was benign    05/13/2014 Surgery    He had removal of Port-A-Cath.    05/22/2014 Imaging    Repeat CT scan of the neck show no evidence of disease recurrence.  12/09/2014 Imaging    Ct neck without contrast showed persistent abnormalities on the right side of neck, indeterminate    12/25/2014 Imaging    PET CT scan showed disease recurrence.    02/10/2015 Procedure    He has placement of port    02/13/2015 - 07/13/2015 Chemotherapy    He received chemotherapy with carbo/Taxol    04/21/2015 Imaging    PET CT scan showed improved disease control    08/04/2015 Imaging    PET scan showed persistent disease    08/17/2015 -  Chemotherapy    He was started with  [facility] Novamed    10/19/2015 Imaging    Ct neck showed mass-like intermediate density soft tissue at the right lateral neck recurrence site stable.     02/26/2016 Imaging    CT neck showed unchanged appearance of right neck recurrence compared to 10/19/2015 CT. No noncontrast evidence of new metastatic disease in the neck. Chronic sinusitis, progressed.    02/26/2016 Imaging    Diffuse but patchy and asymmetric partial airspace filling process (ground-glass opacity) in the lungs. This could be due to respiratory bronchiolitis, hypersensitivity pneumonitis or possible drug reaction. Atypical/viral pneumonia is also possible. Pulmonary consultation may be a helpful. A three-month follow-up noncontrast chest CT is suggested. Slight interval enlargement of mediastinal lymph nodes and a small lymph node along the left major fissure. This is most likely due to the inflammatory process in the lungs. No findings for metastatic disease involving the chest. No findings for upper abdominal metastatic disease.    02/29/2016 Adverse Reaction    His treatment is placed on hold due to possible hypersensitivity pneumonitis/drug reaction    04/14/2016 Imaging    Ct chest showed no evidence for metastatic disease within the chest. Significant interval improvement and near complete resolution of previously described diffuse bilateral predominately ground-glass pulmonary opacities, most compatible with resolving infectious/inflammatory process.    09/05/2016 Imaging    CT scan of neck and chest  1. Unchanged appearance of right neck recurrence. 2. No evidence of new metastatic disease in the neck. 3. Unremarkable and stable CT appearance of the chest. No findings suspicious for metastatic disease    07/24/2017 Imaging    1. No findings suspicious for metastatic disease in the chest. 2. No acute consolidative airspace disease to suggest a pneumonia. 3. No appreciable change in chronic mild patchy upper lung predominant  centrilobular micronodularity. If the patient is a current smoker, these findings are most compatible with smoking related interstitial lung disease. If the patient is not a current smoker, these findings suggest subacute hypersensitivity pneumonitis  or postinflammatory change. 4. Stable mild biapical radiation fibrosis. 5. Mild to moderate centrilobular emphysema with diffuse bronchial wall thickening, suggesting COPD. 6. One vessel coronary atherosclerosis.  Aortic Atherosclerosis (ICD10-I70.0) and Emphysema (ICD10-J43.9).    09/15/2017 Imaging    1. No evidence of interstitial lung disease. 2. Emphysema (ICD10-J43.9).    10/29/2017 Imaging    1. Moderate quality exam for pulmonary embolism with primary limitation of motion. No embolism identified. 2. Findings of congestive heart failure, including bilateral pleural effusions and septal thickening. 3. New thoracic adenopathy since approximately 6 weeks ago. Favor secondary to fluid overload/congestive heart failure. 4. New left apical 4 mm pulmonary nodule. Favored to represent a subpleural lymph node. Non-contrast chest CT can be considered in 12 months, given risk factors for primary bronchogenic carcinoma. This recommendation follows the consensus statement: Guidelines for Management of Incidental Pulmonary Nodules Detected on CT Images: From the  Fleischner Society 2017; Radiology 2017; [phone].     10/29/2017 - 11/02/2017 Hospital Admission    He was admitted to the hospital due to shortness of breath and was found to have congestive heart failure.    10/30/2017 Imaging    Definity used; severe global reduction in LV systolic function; severe LVE; restrictive filling; mild MR; mild LAE; mild RVE with moderate RV dysfunction; mild TR with moderate pulmonary hypertension.    01/26/2018 Imaging    1. Regression of soft tissue in the postoperative right neck favoring scarring. No new or progressive finding to suggest recurrent  disease. 2. Chest CT reported separately    01/26/2018 Imaging    1. No evidence of thoracic metastasis. 2. Port in the anterior chest wall with tip in distal     03/12/2018 Imaging    1. Trace, silent aspiration noted after a swallow consistent with aspiration of residual barium in the hypopharynx. 2. Retrograde reflux of contrast into the posterior nasopharynx with swallowing. 3. No gross esophageal abnormality 4. Initial difficulty swallowing a 13 mm barium tablet that than passes readily into the stomach once it is swallowed.     REVIEW OF SYSTEMS:   Constitutional: Denies fevers, chills or abnormal weight loss Eyes: Denies blurriness of vision Ears, nose, mouth, throat, and face: Denies mucositis or sore throat Respiratory: Denies cough, dyspnea or wheezes Cardiovascular: Denies palpitation, chest discomfort or lower extremity swelling Gastrointestinal:  Denies nausea, heartburn or change in bowel habits Lymphatics: Denies new lymphadenopathy or easy bruising Neurological:Denies numbness, tingling or new weaknesses Behavioral/Psych: Mood is stable, no new changes  All other systems were reviewed with the patient and are negative.  I have reviewed the past medical history, past surgical history, social history and family history with the patient and they are unchanged from previous note.  ALLERGIES:  is allergic to bee pollen and pollen extract.  MEDICATIONS:  Current Outpatient Medications  Medication Sig Dispense Refill  . albuterol (PROVENTIL HFA;VENTOLIN HFA) 108 (90 Base) MCG/ACT inhaler Inhale 2 puffs into the lungs every 6 (six) hours as needed for wheezing or shortness of breath. 1 Inhaler 6  . ALPRAZolam (XANAX) 0.5 MG tablet Take 1 tablet (0.5 mg total) by mouth 2 (two) times daily as needed for anxiety. 60 tablet 0  . aspirin EC 81 MG tablet Take 81 mg by mouth daily.    . carvedilol (COREG) 3.125 MG tablet Take 1 tablet (3.125 mg total) by mouth 2 (two) times  daily with a meal. 60 tablet 3  . fluticasone (FLONASE) 50 MCG/ACT nasal spray Place 2 sprays into both nostrils daily. 16 g 2  . levothyroxine (SYNTHROID, LEVOTHROID) 25 MCG tablet TAKE 1 TABLET BY MOUTH DAILY BEFORE BREAKFAST. 90 tablet 1  . lidocaine-prilocaine (EMLA) cream Apply 1 application topically as needed. 30 g 6  . methocarbamol (ROBAXIN) 500 MG tablet Take 1 tablet (500 mg total) by mouth every 8 (eight) hours as needed for muscle spasms. 30 tablet 0  . mirtazapine (REMERON) 15 MG tablet Take 1 tablet (15 mg total) by mouth at bedtime. 30 tablet 1  . oxyCODONE (ROXICODONE) 15 MG immediate release tablet Take 1 tablet (15 mg total) by mouth every 8 (eight) hours as needed. 90 tablet 0  . predniSONE (DELTASONE) 20 MG tablet Take 2 tablets (40 mg total) by mouth daily with breakfast. 90 tablet 0   No current facility-administered medications for this visit.    Facility-Administered Medications Ordered in Other Visits  Medication Dose Route  Frequency Provider Last Rate Last Dose  . sodium chloride 0.9 % injection 10 mL  10 mL Intravenous PRN Alvy Bimler, Annora Guderian, MD   10 mL at 11/29/16 0818  . sodium chloride 0.9 % injection 10 mL  10 mL Intravenous PRN Alvy Bimler, Leopoldo Mazzie, MD        PHYSICAL EXAMINATION: ECOG PERFORMANCE STATUS: 1 - Symptomatic but completely ambulatory  Vitals:   04/06/18 0901  BP: 109/68  Pulse: 80  Resp: 18  Temp: (!) 97.5 F (36.4 C)  SpO2: 100%   Filed Weights   04/06/18 0901  Weight: 222 lb 6.4 oz (100.9 kg)    GENERAL:alert, no distress and comfortable SKIN: His skin rash has improved dramatically EYES: normal, Conjunctiva are pink and non-injected, sclera clear OROPHARYNX:no exudate, no erythema and lips, buccal mucosa, and tongue normal  NECK: supple, thyroid normal size, non-tender, without nodularity LYMPH:  no palpable lymphadenopathy in the cervical, axillary or inguinal LUNGS: clear to auscultation and percussion with normal breathing effort HEART:  regular rate & rhythm and no murmurs and no lower extremity edema ABDOMEN:abdomen soft, non-tender and normal bowel sounds Musculoskeletal:no cyanosis of digits and no clubbing  NEURO: alert & oriented x 3 with fluent speech, no focal motor/sensory deficits  LABORATORY DATA:  I have reviewed the data as listed    Component Value Date/Time   NA 141 03/05/2018 1012   NA 141 04/20/2017 1028   K 3.6 03/05/2018 1012   K 4.3 04/20/2017 1028   CL 103 03/05/2018 1012   CO2 27 03/05/2018 1012   CO2 26 04/20/2017 1028   GLUCOSE 87 03/05/2018 1012   GLUCOSE 96 04/20/2017 1028   BUN 15 03/05/2018 1012   BUN 16.7 04/20/2017 1028   CREATININE 1.59 (H) 03/05/2018 1012   CREATININE 1.81 (H) 02/15/2018 1501   CREATININE 2.0 (H) 04/20/2017 1028   CALCIUM 8.4 (L) 03/05/2018 1012   CALCIUM 9.0 04/20/2017 1028   PROT 6.7 02/26/2018 0913   PROT 7.0 04/20/2017 1028   ALBUMIN 3.5 02/26/2018 0913   ALBUMIN 4.0 04/20/2017 1028   AST 17 02/26/2018 0913   AST 27 02/15/2018 1501   AST 20 04/20/2017 1028   ALT 17 02/26/2018 0913   ALT 16 02/15/2018 1501   ALT 17 04/20/2017 1028   ALKPHOS 61 02/26/2018 0913   ALKPHOS 61 04/20/2017 1028   BILITOT 0.7 02/26/2018 0913   BILITOT 0.6 02/15/2018 1501   BILITOT 0.49 04/20/2017 1028   GFRNONAA 49 (L) 03/05/2018 1012   GFRNONAA 42 (L) 02/15/2018 1501   GFRAA 57 (L) 03/05/2018 1012   GFRAA 49 (L) 02/15/2018 1501    No results found for: SPEP, UPEP  Lab Results  Component Value Date   WBC 11.2 (H) 03/05/2018   NEUTROABS 9.5 (H) 02/26/2018   HGB 14.2 03/05/2018   HCT 46.3 03/05/2018   MCV 91.7 03/05/2018   PLT 240 03/05/2018      Chemistry      Component Value Date/Time   NA 141 03/05/2018 1012   NA 141 04/20/2017 1028   K 3.6 03/05/2018 1012   K 4.3 04/20/2017 1028   CL 103 03/05/2018 1012   CO2 27 03/05/2018 1012   CO2 26 04/20/2017 1028   BUN 15 03/05/2018 1012   BUN 16.7 04/20/2017 1028   CREATININE 1.59 (H) 03/05/2018 1012    CREATININE 1.81 (H) 02/15/2018 1501   CREATININE 2.0 (H) 04/20/2017 1028      Component Value Date/Time   CALCIUM 8.4 (L) 03/05/2018  1012   CALCIUM 9.0 04/20/2017 1028   ALKPHOS 61 02/26/2018 0913   ALKPHOS 61 04/20/2017 1028   AST 17 02/26/2018 0913   AST 27 02/15/2018 1501   AST 20 04/20/2017 1028   ALT 17 02/26/2018 0913   ALT 16 02/15/2018 1501   ALT 17 04/20/2017 1028   BILITOT 0.7 02/26/2018 0913   BILITOT 0.6 02/15/2018 1501   BILITOT 0.49 04/20/2017 1028       RADIOGRAPHIC STUDIES: I have personally reviewed the radiological images as listed and agreed with the findings in the report. Dg Esophagus  Result Date: 03/12/2018 CLINICAL DATA:  Tonsillar cancer.  Dysphagia. EXAM: ESOPHOGRAM/BARIUM SWALLOW TECHNIQUE: Single contrast examination was performed using  thin barium. FLUOROSCOPY TIME:  Fluoroscopy Time:  1 minutes and 48 seconds Radiation Exposure Index (if provided by the fluoroscopic device): 156 mGy Number of Acquired Spot Images: 0 COMPARISON:  None. FINDINGS: Lateral view of the oropharynx with swallowing shows reflux of contrast material into the posterior nasopharynx. Frontal projection reveals mass-effect on the left hypopharynx with displacement of hypopharyngeal anatomy to the right. Upon repositioning into a lateral projection, the patient was noted to have barium coating the anterior wall the trachea. No cough reflex was evident. Single contrast upright evaluation of the esophagus reveals no gross esophageal mass lesion, diverticulum, or gross esophageal stricture. Patient had some difficulty with initial swallowing of a 13 mm barium tablet, but once of with swallowed and passed readily into the stomach. IMPRESSION: 1. Trace, silent aspiration noted after a swallow consistent with aspiration of residual barium in the hypopharynx. 2. Retrograde reflux of contrast into the posterior nasopharynx with swallowing. 3. No gross esophageal abnormality 4. Initial difficulty  swallowing a 13 mm barium tablet that than passes readily into the stomach once it is swallowed. Electronically Signed   By: Misty Stanley M.D.   On: 03/12/2018 11:01    All questions were answered. The patient knows to call the clinic with any problems, questions or concerns. No barriers to learning was detected.  I spent 15 minutes counseling the patient face to face. The total time spent in the appointment was 20 minutes and more than 50% was on counseling and review of test results  [name], MD 04/06/2018 1:39 PM

## 2018-04-06 NOTE — Assessment & Plan Note (Signed)
His skin is healing well I recommend prednisone taper to 20 mg starting tomorrow for a week and then to reduce further to 10 mg for week, and then see me back for further follow-up.

## 2018-04-06 NOTE — Assessment & Plan Note (Signed)
The patient took a few more oxycodone recently for foot pain.  I recommend the patient to refrain from doing so.  I do not plan to refill his prescription sooner than he is prescribed

## 2018-04-06 NOTE — Assessment & Plan Note (Signed)
Recent ENT evaluation and imaging study showed no evidence of cancer recurrence However, he is experiencing multiple side effects of treatment We will put pembrolizumab on hold until his side effects resolve

## 2018-04-06 NOTE — Assessment & Plan Note (Signed)
His dysphagia has improved while on prednisone therapy. He has gained over 12 pounds of weight since last time I saw him.  I recommend soft diet as tolerated and will continue weaning effort of prednisone

## 2018-04-09 ENCOUNTER — Telehealth: Payer: Self-pay | Admitting: Hematology and Oncology

## 2018-04-09 NOTE — Telephone Encounter (Signed)
Called regarding 1/3

## 2018-04-10 ENCOUNTER — Other Ambulatory Visit: Payer: Self-pay | Admitting: Internal Medicine

## 2018-04-10 MED FILL — CARVEDILOL 3.125 MG TABLET: 3.125 | 30 days supply | Qty: 60 | Fill #0

## 2018-04-20 ENCOUNTER — Inpatient Hospital Stay (HOSPITAL_BASED_OUTPATIENT_CLINIC_OR_DEPARTMENT_OTHER): Payer: Medicaid Other | Admitting: Hematology and Oncology

## 2018-04-20 ENCOUNTER — Encounter: Payer: Self-pay | Admitting: Hematology and Oncology

## 2018-04-20 ENCOUNTER — Inpatient Hospital Stay: Payer: Medicaid Other | Attending: Hematology and Oncology

## 2018-04-20 DIAGNOSIS — G893 Neoplasm related pain (acute) (chronic): Secondary | ICD-10-CM

## 2018-04-20 DIAGNOSIS — C099 Malignant neoplasm of tonsil, unspecified: Secondary | ICD-10-CM | POA: Diagnosis not present

## 2018-04-20 DIAGNOSIS — Z923 Personal history of irradiation: Secondary | ICD-10-CM | POA: Insufficient documentation

## 2018-04-20 DIAGNOSIS — R1312 Dysphagia, oropharyngeal phase: Secondary | ICD-10-CM | POA: Diagnosis not present

## 2018-04-20 DIAGNOSIS — Z79899 Other long term (current) drug therapy: Secondary | ICD-10-CM

## 2018-04-20 DIAGNOSIS — C770 Secondary and unspecified malignant neoplasm of lymph nodes of head, face and neck: Secondary | ICD-10-CM

## 2018-04-20 DIAGNOSIS — R634 Abnormal weight loss: Secondary | ICD-10-CM | POA: Insufficient documentation

## 2018-04-20 DIAGNOSIS — C090 Malignant neoplasm of tonsillar fossa: Secondary | ICD-10-CM

## 2018-04-20 DIAGNOSIS — Z9221 Personal history of antineoplastic chemotherapy: Secondary | ICD-10-CM | POA: Diagnosis not present

## 2018-04-20 DIAGNOSIS — Z7982 Long term (current) use of aspirin: Secondary | ICD-10-CM | POA: Diagnosis not present

## 2018-04-20 DIAGNOSIS — M542 Cervicalgia: Secondary | ICD-10-CM | POA: Insufficient documentation

## 2018-04-20 DIAGNOSIS — L270 Generalized skin eruption due to drugs and medicaments taken internally: Secondary | ICD-10-CM | POA: Diagnosis not present

## 2018-04-20 DIAGNOSIS — F064 Anxiety disorder due to known physiological condition: Secondary | ICD-10-CM | POA: Insufficient documentation

## 2018-04-20 DIAGNOSIS — Z5111 Encounter for antineoplastic chemotherapy: Secondary | ICD-10-CM

## 2018-04-20 LAB — COMPREHENSIVE METABOLIC PANEL
ALK PHOS: 61 U/L (ref 38–126)
ALT: 14 U/L (ref 0–44)
AST: 16 U/L (ref 15–41)
Albumin: 3.2 g/dL — ABNORMAL LOW (ref 3.5–5.0)
Anion gap: 8 (ref 5–15)
BILIRUBIN TOTAL: 0.9 mg/dL (ref 0.3–1.2)
BUN: 7 mg/dL (ref 6–20)
CALCIUM: 8.6 mg/dL — AB (ref 8.9–10.3)
CHLORIDE: 107 mmol/L (ref 98–111)
CO2: 29 mmol/L (ref 22–32)
Creatinine, Ser: 1.45 mg/dL — ABNORMAL HIGH (ref 0.61–1.24)
GFR, EST NON AFRICAN AMERICAN: 56 mL/min — AB (ref >60)
Glucose, Bld: 77 mg/dL (ref 70–99)
Potassium: 4.2 mmol/L (ref 3.5–5.1)
Sodium: 144 mmol/L (ref 135–145)
TOTAL PROTEIN: 6.2 g/dL — AB (ref 6.5–8.1)

## 2018-04-20 LAB — CBC WITH DIFFERENTIAL/PLATELET
Abs Immature Granulocytes: 0.03 10*3/uL (ref 0.00–0.07)
Basophils Absolute: 0 10*3/uL (ref 0.0–0.1)
Basophils Relative: 0 %
Eosinophils Absolute: 0.1 10*3/uL (ref 0.0–0.5)
Eosinophils Relative: 2 %
HCT: 40.3 % (ref 39.0–52.0)
Hemoglobin: 12.2 g/dL — ABNORMAL LOW (ref 13.0–17.0)
Immature Granulocytes: 1 %
Lymphocytes Relative: 10 %
Lymphs Abs: 0.6 10*3/uL — ABNORMAL LOW (ref 0.7–4.0)
MCH: 29.3 pg (ref 26.0–34.0)
MCHC: 30.3 g/dL (ref 30.0–36.0)
MCV: 96.9 fL (ref 80.0–100.0)
Monocytes Absolute: 0.4 10*3/uL (ref 0.1–1.0)
Monocytes Relative: 7 %
Neutro Abs: 5 10*3/uL (ref 1.7–7.7)
Neutrophils Relative %: 80 %
Platelets: 171 10*3/uL (ref 150–400)
RBC: 4.16 MIL/uL — ABNORMAL LOW (ref 4.22–5.81)
RDW: 15.6 % — ABNORMAL HIGH (ref 11.5–15.5)
WBC: 6.2 10*3/uL (ref 4.0–10.5)
nRBC: 0 % (ref 0.0–0.2)

## 2018-04-20 LAB — TSH: TSH: 1.4 u[IU]/mL (ref 0.320–4.118)

## 2018-04-20 MED ORDER — PREDNISONE 5 MG PO TABS: 5.0000 mg | ORAL_TABLET | Freq: Every day | ORAL | 1 refills | Status: DC

## 2018-04-20 MED ORDER — OXYCODONE HCL 15 MG PO TABS: 15.0000 mg | ORAL_TABLET | Freq: Four times a day (QID) | ORAL | 0 refills | Status: DC | PRN

## 2018-04-20 MED ORDER — MIRTAZAPINE 30 MG PO TABS: 30.0000 mg | ORAL_TABLET | Freq: Every day | ORAL | 1 refills | Status: DC

## 2018-04-20 MED FILL — oxyCODONE HCL 15 MG TABS: 15 | 23 days supply | Qty: 90 | Fill #0

## 2018-04-20 MED FILL — predniSONE 5 MG TABS: 5 | 30 days supply | Qty: 30 | Fill #0

## 2018-04-20 MED FILL — MIRTAZAPINE 30 MG TABLET: 30 | 30 days supply | Qty: 30 | Fill #0

## 2018-04-20 NOTE — Assessment & Plan Note (Signed)
Recent ENT evaluation and imaging study showed no evidence of cancer recurrence However, he is experiencing multiple side effects of treatment We will put pembrolizumab on hold until his side effects resolve

## 2018-04-20 NOTE — Assessment & Plan Note (Signed)
He has generalized anxiety and have fear of dying He has been prescribed short-term prescription Xanax which she finds helpful However, I get a sense that the patient is becoming dependent on it He declined increasing the dose of mirtazapine because he does not like the side effects of the treatment  I recommend mood stabilizer along with counseling I do not plan to refill his prescription Xanax because of high risk of medication dependency

## 2018-04-20 NOTE — Progress Notes (Signed)
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 showed no evidence of cancer recurrence However, he is experiencing multiple side effects of treatment We will put pembrolizumab on hold until his side effects resolve  Cancer associated pain He has multifactorial pain, 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 The patient requested increased dose of pain medicine which I declined I recommend him to stay on prednisone therapy along with his prescribed oxycodone I do not plan to escalate the dose of his pain medicine  Drug-induced skin rash His skin rash has improved He is currently taking prednisone therapy When he is ready, I recommend reducing prednisone to 5 mg alternate with 10 mg every other day However, if his inflammatory arthritis is still significant, he will stay on 10 mg of prednisone daily  Anxiety disorder due to medical condition He has generalized anxiety and have fear of dying He has been prescribed short-term prescription Xanax which she finds helpful However, I get a sense that the patient is becoming dependent on it He declined increasing the dose of mirtazapine because he does not like the side effects of the treatment  I recommend mood stabilizer along with counseling I do not plan to refill his prescription Xanax because of high risk of medication dependency   No orders of the defined types were placed in this encounter.   INTERVAL HISTORY: Please see below for problem oriented charting. He returns today to follow-up on symptom  management and recent significant skin rash We have recent reduced dose prednisone, his skin rash has improved However, he complained of significant flare of arthritis pain He also have significant anxiety with fear of dying He is taking prescription oxycodone slightly more frequent than was prescribed and he has been taking Xanax on a regular basis for anxiety He has not gone through any form of counseling He denies suicidal ideation He is requesting both prescription oxycodone to be increased along with Xanax refill His swallowing is stable He has lost some weight.  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 FNA of right level III lymph node was inconclusive for cancer    07/25/2013 Imaging    PET scan showed locally advanced disease with abnormal lymphadenopathy in the right axilla    08/06/2013 Procedure    CT-guided biopsy of the lymphadenopathy was negative for malignancy    08/15/2013 Surgery    Patient has placement of port and feeding tube    08/19/2013 - 09/10/2013 Chemotherapy    Patient received chemotherapy with cisplatin. The patient only received 2 doses due to uncontrolled nausea and acute renal failure.  08/19/2013 - 10/15/2013 Radiation Therapy    Received Helical IMRT Tomotherapy:  Right Tonstil and bilateral neck / 70 Gy in 35 fractions to gross disease, 63 Gy in 35 fractions to high risk nodal echelons, and 56 Gy in 35 fractions to intermediate risk nodal echelons.    08/27/2013 - 08/30/2013 Hospital Admission    The  patient was admitted to the hospital for uncontrolled nausea vomiting and dehydration.    02/14/2014 Imaging    PET/CT scan showed complete response to treatment    03/19/2014 Surgery    He had excisional lymph node biopsy from the right neck. Pathology was benign    05/13/2014 Surgery    He had removal of Port-A-Cath.    05/22/2014 Imaging    Repeat CT scan of the neck show no evidence of disease recurrence.    12/09/2014 Imaging    Ct neck without contrast showed persistent abnormalities on the right side of neck, indeterminate    12/25/2014 Imaging    PET CT scan showed disease recurrence.    02/10/2015 Procedure    He has placement of port    02/13/2015 - 07/13/2015 Chemotherapy    He received chemotherapy with carbo/Taxol    04/21/2015 Imaging    PET CT scan showed improved disease control    08/04/2015 Imaging    PET scan showed persistent disease    08/17/2015 -  Chemotherapy    He was started with [facility]    10/19/2015 Imaging    Ct neck showed mass-like intermediate density soft tissue at the right lateral neck recurrence site stable.     02/26/2016 Imaging    CT neck showed unchanged appearance of right neck recurrence compared to 10/19/2015 CT. No noncontrast evidence of new metastatic disease in the neck. Chronic sinusitis, progressed.    02/26/2016 Imaging    Diffuse but patchy and asymmetric partial airspace filling process (ground-glass opacity) in the lungs. This could be due to respiratory bronchiolitis, hypersensitivity pneumonitis or possible drug reaction. Atypical/viral pneumonia is also possible. Pulmonary consultation may be a helpful. A three-month follow-up noncontrast chest CT is suggested. Slight interval enlargement of mediastinal lymph nodes and a small lymph node along the left major fissure. This is most likely due to the inflammatory process in the lungs. No findings for metastatic disease involving the chest. No findings for upper abdominal metastatic  disease.    02/29/2016 Adverse Reaction    His treatment is placed on hold due to possible hypersensitivity pneumonitis/drug reaction    04/14/2016 Imaging    Ct chest showed no evidence for metastatic disease within the chest. Significant interval improvement and near complete resolution of previously described diffuse bilateral predominately ground-glass pulmonary opacities, most compatible with resolving infectious/inflammatory process.    09/05/2016 Imaging    CT scan of neck and chest  1. Unchanged appearance of right neck recurrence. 2. No evidence of new metastatic disease in the neck. 3. Unremarkable and stable CT appearance of the chest. No findings suspicious for metastatic disease    07/24/2017 Imaging    1. No findings suspicious for metastatic disease in the chest. 2. No acute consolidative airspace disease to suggest a pneumonia. 3. No appreciable change in chronic mild patchy upper lung predominant centrilobular micronodularity. If the patient is a current smoker, these findings are most compatible with smoking related interstitial lung disease. If the patient is not a current smoker, these findings suggest subacute hypersensitivity pneumonitis  or postinflammatory change. 4. Stable mild biapical radiation fibrosis. 5.  Mild to moderate centrilobular emphysema with diffuse bronchial wall thickening, suggesting COPD. 6. One vessel coronary atherosclerosis.  Aortic Atherosclerosis (ICD10-I70.0) and Emphysema (ICD10-J43.9).    09/15/2017 Imaging    1. No evidence of interstitial lung disease. 2. Emphysema (ICD10-J43.9).    10/29/2017 Imaging    1. Moderate quality exam for pulmonary embolism with primary limitation of motion. No embolism identified. 2. Findings of congestive heart failure, including bilateral pleural effusions and septal thickening. 3. New thoracic adenopathy since approximately 6 weeks ago. Favor secondary to fluid overload/congestive heart failure. 4. New  left apical 4 mm pulmonary nodule. Favored to represent a subpleural lymph node. Non-contrast chest CT can be considered in 12 months, given risk factors for primary bronchogenic carcinoma. This recommendation follows the consensus statement: Guidelines for Management of Incidental Pulmonary Nodules Detected on CT Images: From the Fleischner Society 2017; Radiology 2017; 161:096-045.     10/29/2017 - 11/02/2017 Hospital Admission    He was admitted to the hospital due to shortness of breath and was found to have congestive heart failure.    10/30/2017 Imaging    Definity used; severe global reduction in LV systolic function; severe LVE; restrictive filling; mild MR; mild LAE; mild RVE with moderate RV dysfunction; mild TR with moderate pulmonary hypertension.    01/26/2018 Imaging    1. Regression of soft tissue in the postoperative right neck favoring scarring. No new or progressive finding to suggest recurrent disease. 2. Chest CT reported separately    01/26/2018 Imaging    1. No evidence of thoracic metastasis. 2. Port in the anterior chest wall with tip in distal     03/12/2018 Imaging    1. Trace, silent aspiration noted after a swallow consistent with aspiration of residual barium in the hypopharynx. 2. Retrograde reflux of contrast into the posterior nasopharynx with swallowing. 3. No gross esophageal abnormality 4. Initial difficulty swallowing a 13 mm barium tablet that than passes readily into the stomach once it is swallowed.     REVIEW OF SYSTEMS:   Constitutional: Denies fevers, chills Eyes: Denies blurriness of vision Ears, nose, mouth, throat, and face: Denies mucositis or sore throat Respiratory: Denies cough, dyspnea or wheezes Cardiovascular: Denies palpitation, chest discomfort or lower extremity swelling Gastrointestinal:  Denies nausea, heartburn or change in bowel habits Skin: Denies abnormal skin rashes Lymphatics: Denies new lymphadenopathy or easy  bruising Neurological:Denies numbness, tingling or new weaknesses All other systems were reviewed with the patient and are negative.  I have reviewed the past medical history, past surgical history, social history and family history with the patient and they are unchanged from previous note.  ALLERGIES:  is allergic to bee pollen and pollen extract.  MEDICATIONS:  Current Outpatient Medications  Medication Sig Dispense Refill  . albuterol (PROVENTIL HFA;VENTOLIN HFA) 108 (90 Base) MCG/ACT inhaler Inhale 2 puffs into the lungs every 6 (six) hours as needed for wheezing or shortness of breath. 1 Inhaler 6  . ALPRAZolam (XANAX) 0.5 MG tablet Take 1 tablet (0.5 mg total) by mouth 2 (two) times daily as needed for anxiety. 60 tablet 0  . aspirin EC 81 MG tablet Take 81 mg by mouth daily.    . carvedilol (COREG) 3.125 MG tablet TAKE 1 TABLET BY MOUTH TWICE DAILY WITH A MEAL. 60 tablet 7  . fluticasone (FLONASE) 50 MCG/ACT nasal spray Place 2 sprays into both nostrils daily. 16 g 2  . levothyroxine (SYNTHROID, LEVOTHROID) 25 MCG tablet TAKE 1 TABLET BY MOUTH DAILY BEFORE BREAKFAST. [place]  tablet 1  . lidocaine-prilocaine (EMLA) cream Apply 1 application topically as needed. 30 g 6  . methocarbamol (ROBAXIN) 500 MG tablet Take 1 tablet (500 mg total) by mouth every 8 (eight) hours as needed for muscle spasms. 30 tablet 0  . mirtazapine (REMERON) 30 MG tablet Take 1 tablet (30 mg total) by mouth at bedtime. 60 tablet 1  . oxyCODONE (ROXICODONE) 15 MG immediate release tablet Take 1 tablet (15 mg total) by mouth every 6 (six) hours as needed. 90 tablet 0  . predniSONE (DELTASONE) 5 MG tablet Take 1 tablet (5 mg total) by mouth daily with breakfast. 30 tablet 1   No current facility-administered medications for this visit.    Facility-Administered Medications Ordered in Other Visits  Medication Dose Route Frequency Provider Last Rate Last Dose  . sodium chloride 0.9 % injection 10 mL  10 mL Intravenous  PRN Alvy Bimler, Kassity Woodson, MD   10 mL at 11/29/16 0818  . sodium chloride 0.9 % injection 10 mL  10 mL Intravenous PRN Alvy Bimler, Illona Bulman, MD        PHYSICAL EXAMINATION: ECOG PERFORMANCE STATUS: 1 - Symptomatic but completely ambulatory  Vitals:   04/20/18 0958  BP: 108/87  Pulse: 63  Resp: 18  Temp: 98.4 F (36.9 C)  SpO2: 100%   Filed Weights   04/20/18 0958  Weight: 217 lb 12.8 oz (98.8 kg)    GENERAL:alert, no distress and comfortable SKIN: skin color, texture, turgor are normal, no rashes or significant lesions EYES: normal, Conjunctiva are pink and non-injected, sclera clear OROPHARYNX:no exudate, no erythema and lips, buccal mucosa, and tongue normal  NECK: supple, thyroid normal size, non-tender, without nodularity LYMPH:  no palpable lymphadenopathy in the cervical, axillary or inguinal LUNGS: clear to auscultation and percussion with normal breathing effort HEART: regular rate & rhythm and no murmurs and no lower extremity edema ABDOMEN:abdomen soft, non-tender and normal bowel sounds Musculoskeletal:no cyanosis of digits and no clubbing  NEURO: alert & oriented x 3 with fluent speech, no focal motor/sensory deficits  LABORATORY DATA:  I have reviewed the data as listed    Component Value Date/Time   NA 144 04/20/2018 0918   NA 141 04/20/2017 1028   K 4.2 04/20/2018 0918   K 4.3 04/20/2017 1028   CL 107 04/20/2018 0918   CO2 29 04/20/2018 0918   CO2 26 04/20/2017 1028   GLUCOSE 77 04/20/2018 0918   GLUCOSE 96 04/20/2017 1028   BUN 7 04/20/2018 0918   BUN 16.7 04/20/2017 1028   CREATININE 1.45 (H) 04/20/2018 0918   CREATININE 1.81 (H) 02/15/2018 1501   CREATININE 2.0 (H) 04/20/2017 1028   CALCIUM 8.6 (L) 04/20/2018 0918   CALCIUM 9.0 04/20/2017 1028   PROT 6.2 (L) 04/20/2018 0918   PROT 7.0 04/20/2017 1028   ALBUMIN 3.2 (L) 04/20/2018 0918   ALBUMIN 4.0 04/20/2017 1028   AST 16 04/20/2018 0918   AST 27 02/15/2018 1501   AST 20 04/20/2017 1028   ALT 14 04/20/2018  0918   ALT 16 02/15/2018 1501   ALT 17 04/20/2017 1028   ALKPHOS 61 04/20/2018 0918   ALKPHOS 61 04/20/2017 1028   BILITOT 0.9 04/20/2018 0918   BILITOT 0.6 02/15/2018 1501   BILITOT 0.49 04/20/2017 1028   GFRNONAA 56 (L) 04/20/2018 0918   GFRNONAA 42 (L) 02/15/2018 1501   GFRAA >60 04/20/2018 0918   GFRAA 49 (L) 02/15/2018 1501    No results found for: SPEP, UPEP  Lab Results  Component  Value Date   WBC 6.2 04/20/2018   NEUTROABS 5.0 04/20/2018   HGB 12.2 (L) 04/20/2018   HCT 40.3 04/20/2018   MCV 96.9 04/20/2018   PLT 171 04/20/2018      Chemistry      Component Value Date/Time   NA 144 04/20/2018 0918   NA 141 04/20/2017 1028   K 4.2 04/20/2018 0918   K 4.3 04/20/2017 1028   CL 107 04/20/2018 0918   CO2 29 04/20/2018 0918   CO2 26 04/20/2017 1028   BUN 7 04/20/2018 0918   BUN 16.7 04/20/2017 1028   CREATININE 1.45 (H) 04/20/2018 0918   CREATININE 1.81 (H) 02/15/2018 1501   CREATININE 2.0 (H) 04/20/2017 1028      Component Value Date/Time   CALCIUM 8.6 (L) 04/20/2018 0918   CALCIUM 9.0 04/20/2017 1028   ALKPHOS 61 04/20/2018 0918   ALKPHOS 61 04/20/2017 1028   AST 16 04/20/2018 0918   AST 27 02/15/2018 1501   AST 20 04/20/2017 1028   ALT 14 04/20/2018 0918   ALT 16 02/15/2018 1501   ALT 17 04/20/2017 1028   BILITOT 0.9 04/20/2018 0918   BILITOT 0.6 02/15/2018 1501   BILITOT 0.49 04/20/2017 1028       All questions were answered. The patient knows to call the clinic with any problems, questions or concerns. No barriers to learning was detected.  I spent 25 minutes counseling the patient face to face. The total time spent in the appointment was 30 minutes and more than 50% was on counseling and review of test results  [name], MD 04/20/2018 4:23 PM

## 2018-04-20 NOTE — Assessment & Plan Note (Signed)
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

## 2018-04-20 NOTE — Assessment & Plan Note (Signed)
His skin rash has improved He is currently taking prednisone therapy When he is ready, I recommend reducing prednisone to 5 mg alternate with 10 mg every other day However, if his inflammatory arthritis is still significant, he will stay on 10 mg of prednisone daily

## 2018-05-10 MED FILL — LEVOTHYROXINE 25 MCG TABLET: 25 | 90 days supply | Qty: 90 | Fill #0

## 2018-05-16 MED FILL — CARVEDILOL 3.125 MG TABLET: 3.125 | 30 days supply | Qty: 60 | Fill #1

## 2018-05-18 ENCOUNTER — Inpatient Hospital Stay (HOSPITAL_BASED_OUTPATIENT_CLINIC_OR_DEPARTMENT_OTHER): Payer: Medicaid Other | Admitting: Hematology and Oncology

## 2018-05-18 ENCOUNTER — Encounter: Payer: Self-pay | Admitting: Hematology and Oncology

## 2018-05-18 VITALS — BP 136/69 | HR 69 | Resp 18 | Ht 78.0 in | Wt 223.4 lb

## 2018-05-18 DIAGNOSIS — G893 Neoplasm related pain (acute) (chronic): Secondary | ICD-10-CM

## 2018-05-18 DIAGNOSIS — M542 Cervicalgia: Secondary | ICD-10-CM

## 2018-05-18 DIAGNOSIS — C770 Secondary and unspecified malignant neoplasm of lymph nodes of head, face and neck: Secondary | ICD-10-CM

## 2018-05-18 DIAGNOSIS — C099 Malignant neoplasm of tonsil, unspecified: Secondary | ICD-10-CM | POA: Diagnosis not present

## 2018-05-18 DIAGNOSIS — R1312 Dysphagia, oropharyngeal phase: Secondary | ICD-10-CM

## 2018-05-18 DIAGNOSIS — Z9221 Personal history of antineoplastic chemotherapy: Secondary | ICD-10-CM

## 2018-05-18 DIAGNOSIS — L270 Generalized skin eruption due to drugs and medicaments taken internally: Secondary | ICD-10-CM

## 2018-05-18 DIAGNOSIS — Z7982 Long term (current) use of aspirin: Secondary | ICD-10-CM

## 2018-05-18 DIAGNOSIS — F064 Anxiety disorder due to known physiological condition: Secondary | ICD-10-CM

## 2018-05-18 DIAGNOSIS — Z79899 Other long term (current) drug therapy: Secondary | ICD-10-CM

## 2018-05-18 DIAGNOSIS — C090 Malignant neoplasm of tonsillar fossa: Secondary | ICD-10-CM

## 2018-05-18 DIAGNOSIS — Z923 Personal history of irradiation: Secondary | ICD-10-CM

## 2018-05-18 MED ORDER — OXYCODONE HCL 15 MG PO TABS: 15.0000 mg | ORAL_TABLET | Freq: Four times a day (QID) | ORAL | 0 refills | Status: DC | PRN

## 2018-05-18 MED FILL — oxyCODONE HCL 15 MG TABS: 15 | 22 days supply | Qty: 90 | Fill #0

## 2018-05-18 NOTE — Assessment & Plan Note (Signed)
He is recovering well from recent side effects of pembrolizumab I recommend repeat CT imaging in 2 weeks to reassess If he has no signs of residual disease, we will take a treatment break However, if he has cancer recurrence, he will need to be restarted either back on pembrolizumab or trial of methotrexate

## 2018-05-18 NOTE — Assessment & Plan Note (Signed)
His skin rash is stable despite recent prednisone taper He will continue topical emollient cream and we will monitor carefully I do not plan to restart prednisone unless it start to flareup again

## 2018-05-18 NOTE — Assessment & Plan Note (Signed)
He has intermittent dysphagia of unknown etiology His last repeat evaluation by ENT did not disclose cancer recurrence Due to persistent symptoms, I plan to repeat CT scan of the neck for evaluation and he agreed

## 2018-05-18 NOTE — Assessment & Plan Note (Signed)
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  We discussed briefly about narcotic refill policy 

## 2018-05-18 NOTE — Progress Notes (Signed)
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 I recommend repeat CT imaging in 2 weeks to reassess If he has no signs of residual disease, we will take a treatment break However, if he has cancer recurrence, he will need to be restarted either back on pembrolizumab or trial of methotrexate  Dysphagia, oropharyngeal He has intermittent dysphagia of unknown etiology His last repeat evaluation by ENT did not disclose cancer recurrence Due to persistent symptoms, I plan to repeat CT scan of the neck for evaluation and he agreed  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  We discussed briefly about narcotic refill policy  Drug-induced skin rash His skin rash is stable despite recent prednisone taper He will continue topical emollient cream and we will monitor carefully I do not plan to restart prednisone unless it start to flareup again   Orders Placed This Encounter  Procedures  . CT Soft Tissue Neck W Contrast    Standing Status:   Future    Standing Expiration Date:   05/18/2019    Order Specific Question:   If indicated for the ordered procedure, I authorize the administration of contrast media per Radiology protocol    Answer:   Yes    Order Specific Question:   Preferred imaging location?    Answer:   [facility]    Order Specific Question:   Radiology Contrast Protocol - do NOT remove file path    Answer:    [email or web address]    INTERVAL HISTORY: Please see below for problem oriented charting. He returns for further follow-up Since last time I saw him, he was able to completely taper off prednisone on January 22nd, 2020 He has mild residual skin rash on the left lower leg but not significant He denies cough, chest pain or shortness of breath He continues to have intermittent dysphagia but was able to tolerate most diet.  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 FNA of right level III lymph node was inconclusive for cancer    07/25/2013 Imaging    PET scan showed locally advanced disease with abnormal lymphadenopathy in the right axilla    08/06/2013 Procedure    CT-guided biopsy of the lymphadenopathy was negative for malignancy    08/15/2013 Surgery    Patient has placement of port and feeding tube    08/19/2013 - 09/10/2013 Chemotherapy    Patient received chemotherapy with cisplatin. The patient only received 2 doses due to uncontrolled nausea  and acute renal failure.    08/19/2013 - 10/15/2013 Radiation Therapy    Received Helical IMRT Tomotherapy:  Right Tonstil and bilateral neck / 70 Gy in 35 fractions to gross disease, 63 Gy in 35 fractions to high risk nodal echelons, and 56 Gy in 35 fractions to intermediate risk nodal echelons.    08/27/2013 - 08/30/2013 Hospital Admission    The patient was admitted to the hospital for uncontrolled nausea vomiting and  dehydration.    02/14/2014 Imaging    PET/CT scan showed complete response to treatment    03/19/2014 Surgery    He had excisional lymph node biopsy from the right neck. Pathology was benign    05/13/2014 Surgery    He had removal of Port-A-Cath.    05/22/2014 Imaging    Repeat CT scan of the neck show no evidence of disease recurrence.    12/09/2014 Imaging    Ct neck without contrast showed persistent abnormalities on the right side of neck, indeterminate    12/25/2014 Imaging    PET CT scan showed disease recurrence.    02/10/2015 Procedure    He has placement of port    02/13/2015 - 07/13/2015 Chemotherapy    He received chemotherapy with carbo/Taxol    04/21/2015 Imaging    PET CT scan showed improved disease control    08/04/2015 Imaging    PET scan showed persistent disease    08/17/2015 -  Chemotherapy    He was started with [facility]    10/19/2015 Imaging    Ct neck showed mass-like intermediate density soft tissue at the right lateral neck recurrence site stable.     02/26/2016 Imaging    CT neck showed unchanged appearance of right neck recurrence compared to 10/19/2015 CT. No noncontrast evidence of new metastatic disease in the neck. Chronic sinusitis, progressed.    02/26/2016 Imaging    Diffuse but patchy and asymmetric partial airspace filling process (ground-glass opacity) in the lungs. This could be due to respiratory bronchiolitis, hypersensitivity pneumonitis or possible drug reaction. Atypical/viral pneumonia is also possible. Pulmonary consultation may be a helpful. A three-month follow-up noncontrast chest CT is suggested. Slight interval enlargement of mediastinal lymph nodes and a small lymph node along the left major fissure. This is most likely due to the inflammatory process in the lungs. No findings for metastatic disease involving the chest. No findings for upper abdominal metastatic disease.    02/29/2016 Adverse Reaction    His treatment is placed on hold  due to possible hypersensitivity pneumonitis/drug reaction    04/14/2016 Imaging    Ct chest showed no evidence for metastatic disease within the chest. Significant interval improvement and near complete resolution of previously described diffuse bilateral predominately ground-glass pulmonary opacities, most compatible with resolving infectious/inflammatory process.    09/05/2016 Imaging    CT scan of neck and chest  1. Unchanged appearance of right neck recurrence. 2. No evidence of new metastatic disease in the neck. 3. Unremarkable and stable CT appearance of the chest. No findings suspicious for metastatic disease    07/24/2017 Imaging    1. No findings suspicious for metastatic disease in the chest. 2. No acute consolidative airspace disease to suggest a pneumonia. 3. No appreciable change in chronic mild patchy upper lung predominant centrilobular micronodularity. If the patient is a current smoker, these findings are most compatible with smoking related interstitial lung disease. If the patient is not a current smoker, these findings suggest subacute hypersensitivity pneumonitis  or postinflammatory change.  4. Stable mild biapical radiation fibrosis. 5. Mild to moderate centrilobular emphysema with diffuse bronchial wall thickening, suggesting COPD. 6. One vessel coronary atherosclerosis.  Aortic Atherosclerosis (ICD10-I70.0) and Emphysema (ICD10-J43.9).    09/15/2017 Imaging    1. No evidence of interstitial lung disease. 2. Emphysema (ICD10-J43.9).    10/29/2017 Imaging    1. Moderate quality exam for pulmonary embolism with primary limitation of motion. No embolism identified. 2. Findings of congestive heart failure, including bilateral pleural effusions and septal thickening. 3. New thoracic adenopathy since approximately 6 weeks ago. Favor secondary to fluid overload/congestive heart failure. 4. New left apical 4 mm pulmonary nodule. Favored to represent a subpleural lymph node.  Non-contrast chest CT can be considered in 12 months, given risk factors for primary bronchogenic carcinoma. This recommendation follows the consensus statement: Guidelines for Management of Incidental Pulmonary Nodules Detected on CT Images: From the Fleischner Society 2017; Radiology 2017; 203:559-741.     10/29/2017 - 11/02/2017 Hospital Admission    He was admitted to the hospital due to shortness of breath and was found to have congestive heart failure.    10/30/2017 Imaging    Definity used; severe global reduction in LV systolic function; severe LVE; restrictive filling; mild MR; mild LAE; mild RVE with moderate RV dysfunction; mild TR with moderate pulmonary hypertension.    01/26/2018 Imaging    1. Regression of soft tissue in the postoperative right neck favoring scarring. No new or progressive finding to suggest recurrent disease. 2. Chest CT reported separately    01/26/2018 Imaging    1. No evidence of thoracic metastasis. 2. Port in the anterior chest wall with tip in distal     03/12/2018 Imaging    1. Trace, silent aspiration noted after a swallow consistent with aspiration of residual barium in the hypopharynx. 2. Retrograde reflux of contrast into the posterior nasopharynx with swallowing. 3. No gross esophageal abnormality 4. Initial difficulty swallowing a 13 mm barium tablet that than passes readily into the stomach once it is swallowed.     REVIEW OF SYSTEMS:   Constitutional: Denies fevers, chills or abnormal weight loss Eyes: Denies blurriness of vision Ears, nose, mouth, throat, and face: Denies mucositis or sore throat Respiratory: Denies cough, dyspnea or wheezes Cardiovascular: Denies palpitation, chest discomfort or lower extremity swelling Gastrointestinal:  Denies nausea, heartburn or change in bowel habits Lymphatics: Denies new lymphadenopathy or easy bruising Neurological:Denies numbness, tingling or new weaknesses Behavioral/Psych: Mood is stable,  no new changes  All other systems were reviewed with the patient and are negative.  I have reviewed the past medical history, past surgical history, social history and family history with the patient and they are unchanged from previous note.  ALLERGIES:  is allergic to bee pollen and pollen extract.  MEDICATIONS:  Current Outpatient Medications  Medication Sig Dispense Refill  . albuterol (PROVENTIL HFA;VENTOLIN HFA) 108 (90 Base) MCG/ACT inhaler Inhale 2 puffs into the lungs every 6 (six) hours as needed for wheezing or shortness of breath. 1 Inhaler 6  . ALPRAZolam (XANAX) 0.5 MG tablet Take 1 tablet (0.5 mg total) by mouth 2 (two) times daily as needed for anxiety. 60 tablet 0  . aspirin EC 81 MG tablet Take 81 mg by mouth daily.    . carvedilol (COREG) 3.125 MG tablet TAKE 1 TABLET BY MOUTH TWICE DAILY WITH A MEAL. 60 tablet 7  . fluticasone (FLONASE) 50 MCG/ACT nasal spray Place 2 sprays into both nostrils daily. 16 g 2  . levothyroxine (  SYNTHROID, LEVOTHROID) 25 MCG tablet TAKE 1 TABLET BY MOUTH DAILY BEFORE BREAKFAST. 90 tablet 1  . lidocaine-prilocaine (EMLA) cream Apply 1 application topically as needed. 30 g 6  . oxyCODONE (ROXICODONE) 15 MG immediate release tablet Take 1 tablet (15 mg total) by mouth every 6 (six) hours as needed. 90 tablet 0   No current facility-administered medications for this visit.    Facility-Administered Medications Ordered in Other Visits  Medication Dose Route Frequency Provider Last Rate Last Dose  . sodium chloride 0.9 % injection 10 mL  10 mL Intravenous PRN Alvy Bimler, Hashem Goynes, MD   10 mL at 11/29/16 0818  . sodium chloride 0.9 % injection 10 mL  10 mL Intravenous PRN [name], MD        PHYSICAL EXAMINATION: ECOG PERFORMANCE STATUS: 1 - Symptomatic but completely ambulatory  Vitals:   05/18/18 1230  BP: 136/69  Pulse: 69  Resp: 18  SpO2: 98%   Filed Weights   05/18/18 1230  Weight: 223 lb 6.4 oz (101.3 kg)    GENERAL:alert, no distress  and comfortable SKIN: He has mild residual skin rash on the left lower extremity Musculoskeletal:no cyanosis of digits and no clubbing  NEURO: alert & oriented x 3 with fluent speech, no focal motor/sensory deficits  LABORATORY DATA:  I have reviewed the data as listed    Component Value Date/Time   NA 144 04/20/2018 0918   NA 141 04/20/2017 1028   K 4.2 04/20/2018 0918   K 4.3 04/20/2017 1028   CL 107 04/20/2018 0918   CO2 29 04/20/2018 0918   CO2 26 04/20/2017 1028   GLUCOSE 77 04/20/2018 0918   GLUCOSE 96 04/20/2017 1028   BUN 7 04/20/2018 0918   BUN 16.7 04/20/2017 1028   CREATININE 1.45 (H) 04/20/2018 0918   CREATININE 1.81 (H) 02/15/2018 1501   CREATININE 2.0 (H) 04/20/2017 1028   CALCIUM 8.6 (L) 04/20/2018 0918   CALCIUM 9.0 04/20/2017 1028   PROT 6.2 (L) 04/20/2018 0918   PROT 7.0 04/20/2017 1028   ALBUMIN 3.2 (L) 04/20/2018 0918   ALBUMIN 4.0 04/20/2017 1028   AST 16 04/20/2018 0918   AST 27 02/15/2018 1501   AST 20 04/20/2017 1028   ALT 14 04/20/2018 0918   ALT 16 02/15/2018 1501   ALT 17 04/20/2017 1028   ALKPHOS 61 04/20/2018 0918   ALKPHOS 61 04/20/2017 1028   BILITOT 0.9 04/20/2018 0918   BILITOT 0.6 02/15/2018 1501   BILITOT 0.49 04/20/2017 1028   GFRNONAA 56 (L) 04/20/2018 0918   GFRNONAA 42 (L) 02/15/2018 1501   GFRAA >60 04/20/2018 0918   GFRAA 49 (L) 02/15/2018 1501    No results found for: SPEP, UPEP  Lab Results  Component Value Date   WBC 6.2 04/20/2018   NEUTROABS 5.0 04/20/2018   HGB 12.2 (L) 04/20/2018   HCT 40.3 04/20/2018   MCV 96.9 04/20/2018   PLT 171 04/20/2018      Chemistry      Component Value Date/Time   NA 144 04/20/2018 0918   NA 141 04/20/2017 1028   K 4.2 04/20/2018 0918   K 4.3 04/20/2017 1028   CL 107 04/20/2018 0918   CO2 29 04/20/2018 0918   CO2 26 04/20/2017 1028   BUN 7 04/20/2018 0918   BUN 16.7 04/20/2017 1028   CREATININE 1.45 (H) 04/20/2018 0918   CREATININE 1.81 (H) 02/15/2018 1501   CREATININE  2.0 (H) 04/20/2017 1028      Component Value Date/Time  CALCIUM 8.6 (L) 04/20/2018 0918   CALCIUM 9.0 04/20/2017 1028   ALKPHOS 61 04/20/2018 0918   ALKPHOS 61 04/20/2017 1028   AST 16 04/20/2018 0918   AST 27 02/15/2018 1501   AST 20 04/20/2017 1028   ALT 14 04/20/2018 0918   ALT 16 02/15/2018 1501   ALT 17 04/20/2017 1028   BILITOT 0.9 04/20/2018 0918   BILITOT 0.6 02/15/2018 1501   BILITOT 0.49 04/20/2017 1028      All questions were answered. The patient knows to call the clinic with any problems, questions or concerns. No barriers to learning was detected.  I spent 25 minutes counseling the patient face to face. The total time spent in the appointment was 30 minutes and more than 50% was on counseling and review of test results  [name], MD 05/18/2018 1:25 PM

## 2018-05-21 ENCOUNTER — Telehealth: Payer: Self-pay | Admitting: Hematology and Oncology

## 2018-05-21 NOTE — Telephone Encounter (Signed)
Tried to reach regarding 2/10 and 2/11

## 2018-05-24 ENCOUNTER — Telehealth: Payer: Self-pay | Admitting: *Deleted

## 2018-05-25 ENCOUNTER — Ambulatory Visit (HOSPITAL_COMMUNITY): Payer: Medicaid Other

## 2018-05-28 ENCOUNTER — Telehealth: Payer: Self-pay | Admitting: Hematology and Oncology

## 2018-05-28 ENCOUNTER — Inpatient Hospital Stay: Payer: Medicaid Other

## 2018-05-28 ENCOUNTER — Inpatient Hospital Stay: Payer: Medicaid Other | Attending: Hematology and Oncology

## 2018-05-28 DIAGNOSIS — Z923 Personal history of irradiation: Secondary | ICD-10-CM | POA: Diagnosis not present

## 2018-05-28 DIAGNOSIS — G893 Neoplasm related pain (acute) (chronic): Secondary | ICD-10-CM | POA: Diagnosis not present

## 2018-05-28 DIAGNOSIS — Z452 Encounter for adjustment and management of vascular access device: Secondary | ICD-10-CM | POA: Diagnosis present

## 2018-05-28 DIAGNOSIS — Z95828 Presence of other vascular implants and grafts: Secondary | ICD-10-CM | POA: Insufficient documentation

## 2018-05-28 DIAGNOSIS — C099 Malignant neoplasm of tonsil, unspecified: Secondary | ICD-10-CM | POA: Diagnosis not present

## 2018-05-28 DIAGNOSIS — Z79899 Other long term (current) drug therapy: Secondary | ICD-10-CM | POA: Diagnosis not present

## 2018-05-28 DIAGNOSIS — Z7982 Long term (current) use of aspirin: Secondary | ICD-10-CM | POA: Diagnosis not present

## 2018-05-28 DIAGNOSIS — C090 Malignant neoplasm of tonsillar fossa: Secondary | ICD-10-CM

## 2018-05-28 DIAGNOSIS — Z9221 Personal history of antineoplastic chemotherapy: Secondary | ICD-10-CM | POA: Diagnosis not present

## 2018-05-28 DIAGNOSIS — R112 Nausea with vomiting, unspecified: Secondary | ICD-10-CM

## 2018-05-28 DIAGNOSIS — Z5111 Encounter for antineoplastic chemotherapy: Secondary | ICD-10-CM

## 2018-05-28 DIAGNOSIS — C770 Secondary and unspecified malignant neoplasm of lymph nodes of head, face and neck: Secondary | ICD-10-CM

## 2018-05-28 LAB — COMPREHENSIVE METABOLIC PANEL
ALK PHOS: 70 U/L (ref 38–126)
ALT: 10 U/L (ref 0–44)
AST: 19 U/L (ref 15–41)
Albumin: 3.7 g/dL (ref 3.5–5.0)
Anion gap: 10 (ref 5–15)
BILIRUBIN TOTAL: 0.8 mg/dL (ref 0.3–1.2)
BUN: 11 mg/dL (ref 6–20)
CO2: 26 mmol/L (ref 22–32)
CREATININE: 1.61 mg/dL — AB (ref 0.61–1.24)
Calcium: 9 mg/dL (ref 8.9–10.3)
Chloride: 106 mmol/L (ref 98–111)
GFR calc Af Amer: 57 mL/min — ABNORMAL LOW (ref >60)
GFR, EST NON AFRICAN AMERICAN: 49 mL/min — AB (ref >60)
Glucose, Bld: 97 mg/dL (ref 70–99)
Potassium: 3.9 mmol/L (ref 3.5–5.1)
Sodium: 142 mmol/L (ref 135–145)
TOTAL PROTEIN: 6.9 g/dL (ref 6.5–8.1)

## 2018-05-28 LAB — CBC WITH DIFFERENTIAL/PLATELET
ABS IMMATURE GRANULOCYTES: 0.02 10*3/uL (ref 0.00–0.07)
Basophils Absolute: 0 10*3/uL (ref 0.0–0.1)
Basophils Relative: 0 %
Eosinophils Absolute: 0.2 10*3/uL (ref 0.0–0.5)
Eosinophils Relative: 3 %
HEMATOCRIT: 43.9 % (ref 39.0–52.0)
Hemoglobin: 14 g/dL (ref 13.0–17.0)
IMMATURE GRANULOCYTES: 0 %
LYMPHS ABS: 0.7 10*3/uL (ref 0.7–4.0)
Lymphocytes Relative: 11 %
MCH: 30.8 pg (ref 26.0–34.0)
MCHC: 31.9 g/dL (ref 30.0–36.0)
MCV: 96.5 fL (ref 80.0–100.0)
MONOS PCT: 7 %
Monocytes Absolute: 0.5 10*3/uL (ref 0.1–1.0)
NEUTROS ABS: 5 10*3/uL (ref 1.7–7.7)
Neutrophils Relative %: 79 %
Platelets: 244 10*3/uL (ref 150–400)
RBC: 4.55 MIL/uL (ref 4.22–5.81)
RDW: 14.7 % (ref 11.5–15.5)
WBC: 6.3 10*3/uL (ref 4.0–10.5)
nRBC: 0 % (ref 0.0–0.2)

## 2018-05-28 LAB — TSH: TSH: 1.875 u[IU]/mL (ref 0.320–4.118)

## 2018-05-28 MED ORDER — HEPARIN SOD (PORK) LOCK FLUSH 100 UNIT/ML IV SOLN
500.0000 [IU] | Freq: Once | INTRAVENOUS | Status: AC | PRN
  Administered 2018-05-28: 500 [IU] via INTRAVENOUS
  Filled 2018-05-28: qty 5

## 2018-05-28 MED ORDER — SODIUM CHLORIDE 0.9% FLUSH
10.0000 mL | Freq: Once | INTRAVENOUS | Status: AC
  Administered 2018-05-28: 10 mL
  Filled 2018-05-28: qty 10

## 2018-05-28 NOTE — Telephone Encounter (Signed)
Called patient per 2/10 sch message - unable to reach patient - left message with appt date and time

## 2018-05-29 ENCOUNTER — Ambulatory Visit (HOSPITAL_COMMUNITY): Admission: RE | Admit: 2018-05-29 | Payer: Medicaid Other | Source: Ambulatory Visit

## 2018-05-29 ENCOUNTER — Ambulatory Visit: Payer: Self-pay | Admitting: Hematology and Oncology

## 2018-05-29 ENCOUNTER — Telehealth: Payer: Self-pay

## 2018-05-29 NOTE — Telephone Encounter (Signed)
Called and left message regarding missed appt for CT scan this morning. Ask him to call the office back and left radiology scheduler number to call and reschedule CT.

## 2018-05-30 ENCOUNTER — Ambulatory Visit (HOSPITAL_COMMUNITY)
Admission: RE | Admit: 2018-05-30 | Discharge: 2018-05-30 | Disposition: A | Discharge status: Home / Self Care | Payer: Medicaid Other | Source: Ambulatory Visit | Attending: Hematology and Oncology | Admitting: Hematology and Oncology

## 2018-05-30 DIAGNOSIS — C099 Malignant neoplasm of tonsil, unspecified: Secondary | ICD-10-CM | POA: Diagnosis present

## 2018-05-30 DIAGNOSIS — C770 Secondary and unspecified malignant neoplasm of lymph nodes of head, face and neck: Secondary | ICD-10-CM | POA: Insufficient documentation

## 2018-05-30 DIAGNOSIS — C090 Malignant neoplasm of tonsillar fossa: Secondary | ICD-10-CM | POA: Insufficient documentation

## 2018-05-30 MED ORDER — SODIUM CHLORIDE (PF) 0.9 % IJ SOLN
INTRAMUSCULAR | Status: AC
  Filled 2018-05-30: qty 50

## 2018-05-30 MED ORDER — IOHEXOL 300 MG/ML  SOLN
75.0000 mL | Freq: Once | INTRAMUSCULAR | Status: AC | PRN
  Administered 2018-05-30: 75 mL via INTRAVENOUS

## 2018-05-30 MED ORDER — HEPARIN SOD (PORK) LOCK FLUSH 100 UNIT/ML IV SOLN
500.0000 [IU] | Freq: Once | INTRAVENOUS | Status: AC
  Administered 2018-05-30: 500 [IU] via INTRAVENOUS

## 2018-05-30 MED ORDER — HEPARIN SOD (PORK) LOCK FLUSH 100 UNIT/ML IV SOLN
INTRAVENOUS | Status: AC
  Filled 2018-05-30: qty 5

## 2018-05-31 ENCOUNTER — Inpatient Hospital Stay: Payer: Medicaid Other | Admitting: Hematology and Oncology

## 2018-05-31 ENCOUNTER — Telehealth: Payer: Self-pay

## 2018-05-31 NOTE — Telephone Encounter (Signed)
He called to cancel today's appt due to work issue. Appt canceled and scheduling message sent to reschedule.

## 2018-06-04 ENCOUNTER — Telehealth: Payer: Self-pay | Admitting: Hematology and Oncology

## 2018-06-04 ENCOUNTER — Inpatient Hospital Stay (HOSPITAL_BASED_OUTPATIENT_CLINIC_OR_DEPARTMENT_OTHER): Payer: Medicaid Other | Admitting: Hematology and Oncology

## 2018-06-04 ENCOUNTER — Ambulatory Visit: Payer: Medicaid Other | Admitting: Hematology and Oncology

## 2018-06-04 VITALS — BP 147/88 | HR 70 | Temp 98.7°F | Resp 18 | Ht 78.0 in | Wt 227.8 lb

## 2018-06-04 DIAGNOSIS — I5022 Chronic systolic (congestive) heart failure: Secondary | ICD-10-CM

## 2018-06-04 DIAGNOSIS — G893 Neoplasm related pain (acute) (chronic): Secondary | ICD-10-CM

## 2018-06-04 DIAGNOSIS — Z79899 Other long term (current) drug therapy: Secondary | ICD-10-CM | POA: Diagnosis not present

## 2018-06-04 DIAGNOSIS — C099 Malignant neoplasm of tonsil, unspecified: Secondary | ICD-10-CM | POA: Diagnosis not present

## 2018-06-04 DIAGNOSIS — Z7982 Long term (current) use of aspirin: Secondary | ICD-10-CM | POA: Diagnosis not present

## 2018-06-04 DIAGNOSIS — Z923 Personal history of irradiation: Secondary | ICD-10-CM

## 2018-06-04 DIAGNOSIS — Z9221 Personal history of antineoplastic chemotherapy: Secondary | ICD-10-CM

## 2018-06-04 MED ORDER — OXYCODONE HCL 15 MG PO TABS: 15.0000 mg | ORAL_TABLET | Freq: Four times a day (QID) | ORAL | 0 refills | Status: DC | PRN

## 2018-06-04 MED ORDER — LIDOCAINE-PRILOCAINE 2.5-2.5 % EX CREA: 1.0000 "application " | TOPICAL_CREAM | CUTANEOUS | 6 refills | Status: DC | PRN

## 2018-06-04 MED FILL — LIDOCAINE-PRILOCAINE CREAM: 2.5-2.5 | 20 days supply | Qty: 30 | Fill #0

## 2018-06-04 NOTE — Telephone Encounter (Signed)
Scheduled per los, patient declined printout

## 2018-06-05 ENCOUNTER — Encounter: Payer: Self-pay | Admitting: Hematology and Oncology

## 2018-06-05 NOTE — Assessment & Plan Note (Signed)
I have reviewed multiple CT imaging with the patient He has no signs of cancer We will discontinue his treatment permanently due to significant immune mediated reactions which has since resolved I recommend maintaining port patency with port flushes every 6 weeks I will see him back again in a month for further follow-up

## 2018-06-05 NOTE — Progress Notes (Signed)
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 Clinically, he does not have signs or symptoms of congestive heart failure I recommend repeat echocardiogram and he agreed to proceed   Orders Placed This Encounter  Procedures  . ECHOCARDIOGRAM COMPLETE    Standing Status:   Future    Standing Expiration Date:   09/02/2019    Order Specific Question:   Where should this test be performed    Answer:   [facility]    Order Specific Question:   Perflutren DEFINITY (image enhancing agent) should be administered unless hypersensitivity or allergy exist    Answer:   Administer Perflutren    Order Specific Question:   Reason for exam-Echo    Answer:   Cardiomyopathy-Unspecified  425.9 / I42.9    INTERVAL HISTORY: Please see below for problem oriented charting. He returns to review test results His chronic pain is stable. 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 FNA of right level III lymph node was inconclusive for cancer    07/25/2013 Imaging    PET scan showed locally advanced disease with abnormal lymphadenopathy in the right axilla    08/06/2013 Procedure    CT-guided biopsy of the lymphadenopathy was negative for malignancy    08/15/2013 Surgery    Patient has placement of port and feeding tube    08/19/2013 - 09/10/2013 Chemotherapy    Patient received chemotherapy with cisplatin. The patient only received 2 doses due to uncontrolled nausea and acute renal failure.    08/19/2013 - 10/15/2013 Radiation Therapy    Received Helical IMRT Tomotherapy:  Right Tonstil and bilateral neck / 70 Gy in 35 fractions to gross disease, 63 Gy in 35 fractions to high risk nodal echelons, and 56 Gy in 35 fractions to intermediate risk nodal echelons.    08/27/2013 - 08/30/2013 Hospital Admission    The patient was admitted to the hospital for uncontrolled nausea vomiting and dehydration.    02/14/2014 Imaging    PET/CT scan showed complete response to treatment  03/19/2014 Surgery    He had excisional lymph node biopsy from the right neck. Pathology was benign    05/13/2014 Surgery    He had removal of Port-A-Cath.    05/22/2014 Imaging    Repeat CT scan of the neck show no evidence of disease recurrence.    12/09/2014 Imaging    Ct neck without contrast showed persistent abnormalities on the right side of neck, indeterminate    12/25/2014 Imaging    PET CT scan showed  disease recurrence.    02/10/2015 Procedure    He has placement of port    02/13/2015 - 07/13/2015 Chemotherapy    He received chemotherapy with carbo/Taxol    04/21/2015 Imaging    PET CT scan showed improved disease control    08/04/2015 Imaging    PET scan showed persistent disease    08/17/2015 -  Chemotherapy    He was started with [facility]    10/19/2015 Imaging    Ct neck showed mass-like intermediate density soft tissue at the right lateral neck recurrence site stable.     02/26/2016 Imaging    CT neck showed unchanged appearance of right neck recurrence compared to 10/19/2015 CT. No noncontrast evidence of new metastatic disease in the neck. Chronic sinusitis, progressed.    02/26/2016 Imaging    Diffuse but patchy and asymmetric partial airspace filling process (ground-glass opacity) in the lungs. This could be due to respiratory bronchiolitis, hypersensitivity pneumonitis or possible drug reaction. Atypical/viral pneumonia is also possible. Pulmonary consultation may be a helpful. A three-month follow-up noncontrast chest CT is suggested. Slight interval enlargement of mediastinal lymph nodes and a small lymph node along the left major fissure. This is most likely due to the inflammatory process in the lungs. No findings for metastatic disease involving the chest. No findings for upper abdominal metastatic disease.    02/29/2016 Adverse Reaction    His treatment is placed on hold due to possible hypersensitivity pneumonitis/drug reaction    04/14/2016 Imaging    Ct chest showed no evidence for metastatic disease within the chest. Significant interval improvement and near complete resolution of previously described diffuse bilateral predominately ground-glass pulmonary opacities, most compatible with resolving infectious/inflammatory process.    09/05/2016 Imaging    CT scan of neck and chest  1. Unchanged appearance of right neck recurrence. 2. No evidence of new metastatic  disease in the neck. 3. Unremarkable and stable CT appearance of the chest. No findings suspicious for metastatic disease    07/24/2017 Imaging    1. No findings suspicious for metastatic disease in the chest. 2. No acute consolidative airspace disease to suggest a pneumonia. 3. No appreciable change in chronic mild patchy upper lung predominant centrilobular micronodularity. If the patient is a current smoker, these findings are most compatible with smoking related interstitial lung disease. If the patient is not a current smoker, these findings suggest subacute hypersensitivity pneumonitis  or postinflammatory change. 4. Stable mild biapical radiation fibrosis. 5. Mild to moderate centrilobular emphysema with diffuse bronchial wall thickening, suggesting COPD. 6. One vessel coronary atherosclerosis.  Aortic Atherosclerosis (ICD10-I70.0) and Emphysema (ICD10-J43.9).    09/15/2017 Imaging    1. No evidence of interstitial lung disease. 2. Emphysema (ICD10-J43.9).    10/29/2017 Imaging    1. Moderate quality exam for pulmonary embolism with primary limitation of motion. No embolism identified. 2. Findings of congestive heart failure, including bilateral pleural effusions and septal thickening. 3. New thoracic adenopathy since approximately 6 weeks ago. Favor secondary to  fluid overload/congestive heart failure. 4. New left apical 4 mm pulmonary nodule. Favored to represent a subpleural lymph node. Non-contrast chest CT can be considered in 12 months, given risk factors for primary bronchogenic carcinoma. This recommendation follows the consensus statement: Guidelines for Management of Incidental Pulmonary Nodules Detected on CT Images: From the Fleischner Society 2017; Radiology 2017; [id number].     10/29/2017 - 11/02/2017 Hospital Admission    He was admitted to the hospital due to shortness of breath and was found to have congestive heart failure.    10/30/2017 Imaging    Definity used;  severe global reduction in LV systolic function; severe LVE; restrictive filling; mild MR; mild LAE; mild RVE with moderate RV dysfunction; mild TR with moderate pulmonary hypertension.    01/26/2018 Imaging    1. Regression of soft tissue in the postoperative right neck favoring scarring. No new or progressive finding to suggest recurrent disease. 2. Chest CT reported separately    01/26/2018 Imaging    1. No evidence of thoracic metastasis. 2. Port in the anterior chest wall with tip in distal     03/12/2018 Imaging    1. Trace, silent aspiration noted after a swallow consistent with aspiration of residual barium in the hypopharynx. 2. Retrograde reflux of contrast into the posterior nasopharynx with swallowing. 3. No gross esophageal abnormality 4. Initial difficulty swallowing a 13 mm barium tablet that than passes readily into the stomach once it is swallowed.    05/31/2018 Imaging    Resolution of previously seen mucosal hyperenhancement in the pharynx and larynx. Otherwise unchanged examination of the neck without evidence of recurrent disease or cervical nodal metastases.      REVIEW OF SYSTEMS:   Constitutional: Denies fevers, chills or abnormal weight loss Eyes: Denies blurriness of vision Ears, nose, mouth, throat, and face: Denies mucositis or sore throat Respiratory: Denies cough, dyspnea or wheezes Cardiovascular: Denies palpitation, chest discomfort or lower extremity swelling Gastrointestinal:  Denies nausea, heartburn or change in bowel habits Lymphatics: Denies new lymphadenopathy or easy bruising Neurological:Denies numbness, tingling or new weaknesses Behavioral/Psych: Mood is stable, no new changes  All other systems were reviewed with the patient and are negative.  I have reviewed the past medical history, past surgical history, social history and family history with the patient and they are unchanged from previous note.  ALLERGIES:  is allergic to bee  pollen and pollen extract.  MEDICATIONS:  Current Outpatient Medications  Medication Sig Dispense Refill  . albuterol (PROVENTIL HFA;VENTOLIN HFA) 108 (90 Base) MCG/ACT inhaler Inhale 2 puffs into the lungs every 6 (six) hours as needed for wheezing or shortness of breath. 1 Inhaler 6  . ALPRAZolam (XANAX) 0.5 MG tablet Take 1 tablet (0.5 mg total) by mouth 2 (two) times daily as needed for anxiety. 60 tablet 0  . aspirin EC 81 MG tablet Take 81 mg by mouth daily.    . carvedilol (COREG) 3.125 MG tablet TAKE 1 TABLET BY MOUTH TWICE DAILY WITH A MEAL. 60 tablet 7  . fluticasone (FLONASE) 50 MCG/ACT nasal spray Place 2 sprays into both nostrils daily. 16 g 2  . levothyroxine (SYNTHROID, LEVOTHROID) 25 MCG tablet TAKE 1 TABLET BY MOUTH DAILY BEFORE BREAKFAST. 90 tablet 1  . lidocaine-prilocaine (EMLA) cream Apply 1 application topically as needed. 30 g 6  . oxyCODONE (ROXICODONE) 15 MG immediate release tablet Take 1 tablet (15 mg total) by mouth every 6 (six) hours as needed. 90 tablet 0   No current facility-administered medications  for this visit.    Facility-Administered Medications Ordered in Other Visits  Medication Dose Route Frequency Provider Last Rate Last Dose  . sodium chloride 0.9 % injection 10 mL  10 mL Intravenous PRN Alvy Bimler, Madora Barletta, MD   10 mL at 11/29/16 0818  . sodium chloride 0.9 % injection 10 mL  10 mL Intravenous PRN Alvy Bimler, Brilynn Biasi, MD        PHYSICAL EXAMINATION: ECOG PERFORMANCE STATUS: 1 - Symptomatic but completely ambulatory  Vitals:   06/04/18 1305  BP: (!) 147/88  Pulse: 70  Resp: 18  Temp: 98.7 F (37.1 C)  SpO2: 100%   Filed Weights   06/04/18 1305  Weight: 227 lb 12.8 oz (103.3 kg)    GENERAL:alert, no distress and comfortable SKIN: skin color, texture, turgor are normal, no rashes or significant lesions.  He is noted to have dry skin Musculoskeletal:no cyanosis of digits and no clubbing  NEURO: alert & oriented x 3 with fluent speech, no focal  motor/sensory deficits  LABORATORY DATA:  I have reviewed the data as listed    Component Value Date/Time   NA 142 05/28/2018 0911   NA 141 04/20/2017 1028   K 3.9 05/28/2018 0911   K 4.3 04/20/2017 1028   CL 106 05/28/2018 0911   CO2 26 05/28/2018 0911   CO2 26 04/20/2017 1028   GLUCOSE 97 05/28/2018 0911   GLUCOSE 96 04/20/2017 1028   BUN 11 05/28/2018 0911   BUN 16.7 04/20/2017 1028   CREATININE 1.61 (H) 05/28/2018 0911   CREATININE 1.81 (H) 02/15/2018 1501   CREATININE 2.0 (H) 04/20/2017 1028   CALCIUM 9.0 05/28/2018 0911   CALCIUM 9.0 04/20/2017 1028   PROT 6.9 05/28/2018 0911   PROT 7.0 04/20/2017 1028   ALBUMIN 3.7 05/28/2018 0911   ALBUMIN 4.0 04/20/2017 1028   AST 19 05/28/2018 0911   AST 27 02/15/2018 1501   AST 20 04/20/2017 1028   ALT 10 05/28/2018 0911   ALT 16 02/15/2018 1501   ALT 17 04/20/2017 1028   ALKPHOS 70 05/28/2018 0911   ALKPHOS 61 04/20/2017 1028   BILITOT 0.8 05/28/2018 0911   BILITOT 0.6 02/15/2018 1501   BILITOT 0.49 04/20/2017 1028   GFRNONAA 49 (L) 05/28/2018 0911   GFRNONAA 42 (L) 02/15/2018 1501   GFRAA 57 (L) 05/28/2018 0911   GFRAA 49 (L) 02/15/2018 1501    No results found for: SPEP, UPEP  Lab Results  Component Value Date   WBC 6.3 05/28/2018   NEUTROABS 5.0 05/28/2018   HGB 14.0 05/28/2018   HCT 43.9 05/28/2018   MCV 96.5 05/28/2018   PLT 244 05/28/2018      Chemistry      Component Value Date/Time   NA 142 05/28/2018 0911   NA 141 04/20/2017 1028   K 3.9 05/28/2018 0911   K 4.3 04/20/2017 1028   CL 106 05/28/2018 0911   CO2 26 05/28/2018 0911   CO2 26 04/20/2017 1028   BUN 11 05/28/2018 0911   BUN 16.7 04/20/2017 1028   CREATININE 1.61 (H) 05/28/2018 0911   CREATININE 1.81 (H) 02/15/2018 1501   CREATININE 2.0 (H) 04/20/2017 1028      Component Value Date/Time   CALCIUM 9.0 05/28/2018 0911   CALCIUM 9.0 04/20/2017 1028   ALKPHOS 70 05/28/2018 0911   ALKPHOS 61 04/20/2017 1028   AST 19 05/28/2018 0911    AST 27 02/15/2018 1501   AST 20 04/20/2017 1028   ALT 10 05/28/2018 0911  ALT 16 02/15/2018 1501   ALT 17 04/20/2017 1028   BILITOT 0.8 05/28/2018 0911   BILITOT 0.6 02/15/2018 1501   BILITOT 0.49 04/20/2017 1028       RADIOGRAPHIC STUDIES: I have reviewed multiple imaging studies with the patient I have personally reviewed the radiological images as listed and agreed with the findings in the report. Ct Soft Tissue Neck W Contrast  Result Date: 05/31/2018 CLINICAL DATA:  Tonsillar cancer status post resection and chemotherapy. EXAM: CT NECK WITH CONTRAST TECHNIQUE: Multidetector CT imaging of the neck was performed using the standard protocol following the bolus administration of intravenous contrast. CONTRAST:  80mL OMNIPAQUE IOHEXOL 300 MG/ML  SOLN COMPARISON:  CT neck 03/05/2018 FINDINGS: PHARYNX AND LARYNX: --Nasopharynx: Fossae of Rosenmuller are clear. Normal adenoid tonsils for age. --Oral cavity and oropharynx: The palatine and lingual tonsils are normal. The visible oral cavity and floor of mouth are normal. --Hypopharynx: Normal vallecula and pyriform sinuses. --Larynx: Normal epiglottis and pre-epiglottic space. Normal aryepiglottic and vocal folds. --Retropharyngeal space: No abscess, effusion or lymphadenopathy. SALIVARY GLANDS: --Parotid: No mass lesion or inflammation. No sialolithiasis or ductal dilatation. --Submandibular: Symmetric without inflammation. No sialolithiasis or ductal dilatation. --Sublingual: Normal. No ranula or other visible lesion of the base of tongue and floor of mouth. THYROID: Normal. LYMPH NODES: Unchanged level 1A 7 mm node. There are postsurgical changes in the right neck, stable since the previous exam. No right-sided cervical lymphadenopathy. Subcentimeter lymph nodes at left level 2A are unchanged. VASCULAR: Major cervical vessels are patent. LIMITED INTRACRANIAL: Normal. VISUALIZED ORBITS: Normal. MASTOIDS AND VISUALIZED PARANASAL SINUSES: No fluid  levels or advanced mucosal thickening. No mastoid effusion. SKELETON: No bony spinal canal stenosis. No lytic or blastic lesions. UPPER CHEST: Clear. OTHER: None. IMPRESSION: Resolution of previously seen mucosal hyperenhancement in the pharynx and larynx. Otherwise unchanged examination of the neck without evidence of recurrent disease or cervical nodal metastases. Electronically Signed   By: Ulyses Jarred M.D.   On: 05/31/2018 04:26    All questions were answered. The patient knows to call the clinic with any problems, questions or concerns. No barriers to learning was detected.  I spent 15 minutes counseling the patient face to face. The total time spent in the appointment was 20 minutes and more than 50% was on counseling and review of test results  [name], MD 06/05/2018 8:19 AM

## 2018-06-05 NOTE — Assessment & Plan Note (Signed)
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  We discussed briefly about narcotic refill policy 

## 2018-06-05 NOTE — Assessment & Plan Note (Signed)
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 Clinically, he does not have signs or symptoms of congestive heart failure I recommend repeat echocardiogram and he agreed to proceed

## 2018-06-07 ENCOUNTER — Ambulatory Visit (HOSPITAL_COMMUNITY)
Admission: RE | Admit: 2018-06-07 | Discharge: 2018-06-07 | Disposition: A | Discharge status: Home / Self Care | Payer: Medicaid Other | Source: Ambulatory Visit | Attending: Hematology and Oncology | Admitting: Hematology and Oncology

## 2018-06-07 DIAGNOSIS — Z87891 Personal history of nicotine dependence: Secondary | ICD-10-CM | POA: Insufficient documentation

## 2018-06-07 DIAGNOSIS — I110 Hypertensive heart disease with heart failure: Secondary | ICD-10-CM | POA: Diagnosis not present

## 2018-06-07 DIAGNOSIS — I5022 Chronic systolic (congestive) heart failure: Secondary | ICD-10-CM | POA: Diagnosis not present

## 2018-06-07 DIAGNOSIS — I429 Cardiomyopathy, unspecified: Secondary | ICD-10-CM | POA: Diagnosis not present

## 2018-06-07 MED FILL — oxyCODONE HCL 15 MG TABS: 15 | 22 days supply | Qty: 90 | Fill #0

## 2018-06-07 NOTE — Progress Notes (Signed)
  Echocardiogram 2D Echocardiogram has been performed.  Mason [name] 06/07/2018, 1:52 PM

## 2018-06-08 ENCOUNTER — Telehealth: Payer: Self-pay

## 2018-06-08 NOTE — Telephone Encounter (Signed)
-----   Message from [name], MD sent at 06/08/2018  7:02 AM EST ----- Regarding: echo result Pls call him and let him know echo is a bit better but not normal He saw Dr. [name] 6 months ago. He needs repeat follow-up. He needs to call to set up appointment

## 2018-06-08 NOTE — Telephone Encounter (Signed)
Called and given below message. He verbalized understanding and will call for appt today with Dr. Harrington Challenger.

## 2018-06-13 ENCOUNTER — Ambulatory Visit: Payer: Medicaid Other | Admitting: Hematology and Oncology

## 2018-06-20 ENCOUNTER — Telehealth: Payer: Self-pay

## 2018-06-20 MED FILL — CARVEDILOL 3.125 MG TABLET: 3.125 | 30 days supply | Qty: 60 | Fill #2

## 2018-06-20 NOTE — Telephone Encounter (Signed)
-----   Message from [name], MD sent at 06/20/2018  7:31 AM EST ----- Regarding: cardiology follow-up He has not made appointment yet Tell him I will not be pleased when I see him if I do not see an appointment made

## 2018-06-20 NOTE — Telephone Encounter (Signed)
Called and given below message. He verbalized understanding. He will call and make appt today.

## 2018-07-02 ENCOUNTER — Inpatient Hospital Stay: Payer: Medicaid Other

## 2018-07-02 ENCOUNTER — Inpatient Hospital Stay: Payer: Medicaid Other | Attending: Hematology and Oncology

## 2018-07-02 ENCOUNTER — Encounter: Payer: Self-pay | Admitting: Hematology and Oncology

## 2018-07-02 ENCOUNTER — Other Ambulatory Visit: Payer: Self-pay

## 2018-07-02 ENCOUNTER — Inpatient Hospital Stay (HOSPITAL_BASED_OUTPATIENT_CLINIC_OR_DEPARTMENT_OTHER): Payer: Medicaid Other | Admitting: Hematology and Oncology

## 2018-07-02 DIAGNOSIS — Z95828 Presence of other vascular implants and grafts: Secondary | ICD-10-CM

## 2018-07-02 DIAGNOSIS — C099 Malignant neoplasm of tonsil, unspecified: Secondary | ICD-10-CM

## 2018-07-02 DIAGNOSIS — I509 Heart failure, unspecified: Secondary | ICD-10-CM

## 2018-07-02 DIAGNOSIS — G893 Neoplasm related pain (acute) (chronic): Secondary | ICD-10-CM | POA: Diagnosis not present

## 2018-07-02 DIAGNOSIS — N182 Chronic kidney disease, stage 2 (mild): Secondary | ICD-10-CM

## 2018-07-02 DIAGNOSIS — C090 Malignant neoplasm of tonsillar fossa: Secondary | ICD-10-CM

## 2018-07-02 DIAGNOSIS — C770 Secondary and unspecified malignant neoplasm of lymph nodes of head, face and neck: Secondary | ICD-10-CM

## 2018-07-02 DIAGNOSIS — I5022 Chronic systolic (congestive) heart failure: Secondary | ICD-10-CM

## 2018-07-02 DIAGNOSIS — Z5111 Encounter for antineoplastic chemotherapy: Secondary | ICD-10-CM

## 2018-07-02 DIAGNOSIS — R112 Nausea with vomiting, unspecified: Secondary | ICD-10-CM

## 2018-07-02 LAB — CBC WITH DIFFERENTIAL/PLATELET
Abs Immature Granulocytes: 0.02 10*3/uL (ref 0.00–0.07)
Basophils Absolute: 0 10*3/uL (ref 0.0–0.1)
Basophils Relative: 0 %
Eosinophils Absolute: 0.1 10*3/uL (ref 0.0–0.5)
Eosinophils Relative: 2 %
HCT: 45.2 % (ref 39.0–52.0)
HEMOGLOBIN: 14.3 g/dL (ref 13.0–17.0)
Immature Granulocytes: 0 %
Lymphocytes Relative: 7 %
Lymphs Abs: 0.6 10*3/uL — ABNORMAL LOW (ref 0.7–4.0)
MCH: 29.8 pg (ref 26.0–34.0)
MCHC: 31.6 g/dL (ref 30.0–36.0)
MCV: 94.2 fL (ref 80.0–100.0)
MONOS PCT: 8 %
Monocytes Absolute: 0.7 10*3/uL (ref 0.1–1.0)
Neutro Abs: 6.7 10*3/uL (ref 1.7–7.7)
Neutrophils Relative %: 83 %
Platelets: 259 10*3/uL (ref 150–400)
RBC: 4.8 MIL/uL (ref 4.22–5.81)
RDW: 13.4 % (ref 11.5–15.5)
WBC: 8.1 10*3/uL (ref 4.0–10.5)
nRBC: 0 % (ref 0.0–0.2)

## 2018-07-02 MED ORDER — SODIUM CHLORIDE 0.9% FLUSH
10.0000 mL | Freq: Once | INTRAVENOUS | Status: AC
  Administered 2018-07-02: 10 mL
  Filled 2018-07-02: qty 10

## 2018-07-02 MED ORDER — HEPARIN SOD (PORK) LOCK FLUSH 100 UNIT/ML IV SOLN
500.0000 [IU] | Freq: Once | INTRAVENOUS | Status: AC | PRN
  Administered 2018-07-02: 500 [IU] via INTRAVENOUS
  Filled 2018-07-02: qty 5

## 2018-07-02 MED ORDER — OXYCODONE HCL 15 MG PO TABS: 15.0000 mg | ORAL_TABLET | Freq: Four times a day (QID) | ORAL | 0 refills | Status: DC | PRN

## 2018-07-02 MED FILL — oxyCODONE HCL 15 MG TABS: 15 | 23 days supply | Qty: 90 | Fill #0

## 2018-07-02 NOTE — Assessment & Plan Note (Signed)
He has stable chronic kidney disease Continue close observation

## 2018-07-02 NOTE — Assessment & Plan Note (Signed)
His last Ct imaging showed no signs of cancer We will discontinue his treatment permanently due to significant immune mediated reactions which has since resolved I recommend maintaining port patency with port flushes every 6 weeks I will see him back again in a month for further follow-up

## 2018-07-02 NOTE — Assessment & Plan Note (Signed)
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 Clinically, he does not have signs or symptoms of congestive heart failure Repeat echocardiogram showed improvement of ejection fraction I reminded him the importance of making appointment to follow with his cardiologist for medical management

## 2018-07-02 NOTE — Progress Notes (Signed)
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 Clinically, he does not have signs or symptoms of congestive heart failure Repeat echocardiogram showed improvement of ejection fraction I reminded him the importance of making appointment to follow with his cardiologist for medical management  Chronic kidney disease (CKD), stage II (mild) He has stable chronic kidney disease Continue close observation   No orders of the defined types were placed in this encounter.   INTERVAL HISTORY: Please see below for problem oriented charting. He returns for further follow-up He denies dysphagia No new lymphadenopathy in his neck No recent cough, chest pain or shortness of breath He has no recent infection.  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 FNA of right level III lymph node was inconclusive for cancer    07/25/2013 Imaging    PET scan showed locally advanced disease with abnormal lymphadenopathy in the right axilla    08/06/2013 Procedure    CT-guided biopsy of the lymphadenopathy was negative for malignancy    08/15/2013 Surgery    Patient has placement of port and feeding tube    08/19/2013 - 09/10/2013 Chemotherapy    Patient received chemotherapy with cisplatin. The patient only received 2 doses due to uncontrolled nausea and acute renal failure.    08/19/2013 - 10/15/2013 Radiation Therapy    Received Helical IMRT Tomotherapy:  Right Tonstil and bilateral neck / 70 Gy in 35 fractions to gross disease, 63 Gy in 35 fractions to high risk nodal echelons, and 56 Gy in 35 fractions to intermediate risk nodal echelons.    08/27/2013 - 08/30/2013 Hospital Admission    The patient was admitted to the hospital for uncontrolled nausea vomiting and dehydration.    02/14/2014 Imaging    PET/CT scan showed complete response to treatment    03/19/2014 Surgery    He had excisional lymph node biopsy from the right neck. Pathology was benign    05/13/2014 Surgery    He had removal of Port-A-Cath.    05/22/2014 Imaging    Repeat CT scan of the neck show no evidence of disease recurrence.    12/09/2014 Imaging    Ct neck without contrast showed persistent abnormalities on  the right side of neck, indeterminate    12/25/2014 Imaging    PET CT scan showed disease recurrence.    02/10/2015 Procedure    He has placement of port    02/13/2015 - 07/13/2015 Chemotherapy    He received chemotherapy with carbo/Taxol    04/21/2015 Imaging    PET CT scan showed improved disease control    08/04/2015 Imaging    PET scan showed persistent  disease    08/17/2015 -  Chemotherapy    He was started with [facility]    10/19/2015 Imaging    Ct neck showed mass-like intermediate density soft tissue at the right lateral neck recurrence site stable.     02/26/2016 Imaging    CT neck showed unchanged appearance of right neck recurrence compared to 10/19/2015 CT. No noncontrast evidence of new metastatic disease in the neck. Chronic sinusitis, progressed.    02/26/2016 Imaging    Diffuse but patchy and asymmetric partial airspace filling process (ground-glass opacity) in the lungs. This could be due to respiratory bronchiolitis, hypersensitivity pneumonitis or possible drug reaction. Atypical/viral pneumonia is also possible. Pulmonary consultation may be a helpful. A three-month follow-up noncontrast chest CT is suggested. Slight interval enlargement of mediastinal lymph nodes and a small lymph node along the left major fissure. This is most likely due to the inflammatory process in the lungs. No findings for metastatic disease involving the chest. No findings for upper abdominal metastatic disease.    02/29/2016 Adverse Reaction    His treatment is placed on hold due to possible hypersensitivity pneumonitis/drug reaction    04/14/2016 Imaging    Ct chest showed no evidence for metastatic disease within the chest. Significant interval improvement and near complete resolution of previously described diffuse bilateral predominately ground-glass pulmonary opacities, most compatible with resolving infectious/inflammatory process.    09/05/2016 Imaging    CT scan of neck and chest  1. Unchanged appearance of right neck recurrence. 2. No evidence of new metastatic disease in the neck. 3. Unremarkable and stable CT appearance of the chest. No findings suspicious for metastatic disease    07/24/2017 Imaging    1. No findings suspicious for metastatic disease in the chest. 2. No acute consolidative airspace disease to suggest a pneumonia. 3. No  appreciable change in chronic mild patchy upper lung predominant centrilobular micronodularity. If the patient is a current smoker, these findings are most compatible with smoking related interstitial lung disease. If the patient is not a current smoker, these findings suggest subacute hypersensitivity pneumonitis  or postinflammatory change. 4. Stable mild biapical radiation fibrosis. 5. Mild to moderate centrilobular emphysema with diffuse bronchial wall thickening, suggesting COPD. 6. One vessel coronary atherosclerosis.  Aortic Atherosclerosis (ICD10-I70.0) and Emphysema (ICD10-J43.9).    09/15/2017 Imaging    1. No evidence of interstitial lung disease. 2. Emphysema (ICD10-J43.9).    10/29/2017 Imaging    1. Moderate quality exam for pulmonary embolism with primary limitation of motion. No embolism identified. 2. Findings of congestive heart failure, including bilateral pleural effusions and septal thickening. 3. New thoracic adenopathy since approximately 6 weeks ago. Favor secondary to fluid overload/congestive heart failure. 4. New left apical 4 mm pulmonary nodule. Favored to represent a subpleural lymph node. Non-contrast chest CT can be considered in 12 months, given risk factors for primary bronchogenic carcinoma. This recommendation follows the consensus statement: Guidelines for Management of Incidental Pulmonary Nodules Detected on CT Images: From the Fleischner Society 2017; Radiology 2017; 101:751-025.     10/29/2017 - 11/02/2017  Hospital Admission    He was admitted to the hospital due to shortness of breath and was found to have congestive heart failure.    10/30/2017 Imaging    Definity used; severe global reduction in LV systolic function; severe LVE; restrictive filling; mild MR; mild LAE; mild RVE with moderate RV dysfunction; mild TR with moderate pulmonary hypertension.    01/26/2018 Imaging    1. Regression of soft tissue in the postoperative right neck favoring  scarring. No new or progressive finding to suggest recurrent disease. 2. Chest CT reported separately    01/26/2018 Imaging    1. No evidence of thoracic metastasis. 2. Port in the anterior chest wall with tip in distal     03/12/2018 Imaging    1. Trace, silent aspiration noted after a swallow consistent with aspiration of residual barium in the hypopharynx. 2. Retrograde reflux of contrast into the posterior nasopharynx with swallowing. 3. No gross esophageal abnormality 4. Initial difficulty swallowing a 13 mm barium tablet that than passes readily into the stomach once it is swallowed.    05/31/2018 Imaging    Resolution of previously seen mucosal hyperenhancement in the pharynx and larynx. Otherwise unchanged examination of the neck without evidence of recurrent disease or cervical nodal metastases.     06/07/2018 Echocardiogram    1. The left ventricle has moderately reduced systolic function, with an ejection fraction of 35-40%. The cavity size was severely dilated. Left ventricular diastolic Doppler parameters are consistent with impaired relaxation Left ventricular diffuse hypokinesis.  2. The right ventricle has normal systolic function. The cavity was moderately enlarged. There is no increase in right ventricular wall thickness.  3. The mitral valve is normal in structure.  4. The tricuspid valve is normal in structure.  5. The aortic valve is normal in structure.  6. The pulmonic valve was normal in structure.  7. The inferior vena cava was dilated in size with >50% respiratory variability.     REVIEW OF SYSTEMS:   Constitutional: Denies fevers, chills or abnormal weight loss Eyes: Denies blurriness of vision Ears, nose, mouth, throat, and face: Denies mucositis or sore throat Respiratory: Denies cough, dyspnea or wheezes Cardiovascular: Denies palpitation, chest discomfort or lower extremity swelling Gastrointestinal:  Denies nausea, heartburn or change in bowel  habits Skin: Denies abnormal skin rashes Lymphatics: Denies new lymphadenopathy or easy bruising Neurological:Denies numbness, tingling or new weaknesses Behavioral/Psych: Mood is stable, no new changes  All other systems were reviewed with the patient and are negative.  I have reviewed the past medical history, past surgical history, social history and family history with the patient and they are unchanged from previous note.  ALLERGIES:  is allergic to bee pollen and pollen extract.  MEDICATIONS:  Current Outpatient Medications  Medication Sig Dispense Refill  . albuterol (PROVENTIL HFA;VENTOLIN HFA) 108 (90 Base) MCG/ACT inhaler Inhale 2 puffs into the lungs every 6 (six) hours as needed for wheezing or shortness of breath. 1 Inhaler 6  . ALPRAZolam (XANAX) 0.5 MG tablet Take 1 tablet (0.5 mg total) by mouth 2 (two) times daily as needed for anxiety. 60 tablet 0  . aspirin EC 81 MG tablet Take 81 mg by mouth daily.    . carvedilol (COREG) 3.125 MG tablet TAKE 1 TABLET BY MOUTH TWICE DAILY WITH A MEAL. 60 tablet 7  . fluticasone (FLONASE) 50 MCG/ACT nasal spray Place 2 sprays into both nostrils daily. 16 g 2  . levothyroxine (SYNTHROID, LEVOTHROID) 25 MCG tablet TAKE 1 TABLET  BY MOUTH DAILY BEFORE BREAKFAST. 90 tablet 1  . lidocaine-prilocaine (EMLA) cream Apply 1 application topically as needed. 30 g 6  . oxyCODONE (ROXICODONE) 15 MG immediate release tablet Take 1 tablet (15 mg total) by mouth every 6 (six) hours as needed. 90 tablet 0   No current facility-administered medications for this visit.    Facility-Administered Medications Ordered in Other Visits  Medication Dose Route Frequency Provider Last Rate Last Dose  . sodium chloride 0.9 % injection 10 mL  10 mL Intravenous PRN Alvy Bimler, Elizabth Palka, MD   10 mL at 11/29/16 0818  . sodium chloride 0.9 % injection 10 mL  10 mL Intravenous PRN Alvy Bimler, Sena Clouatre, MD        PHYSICAL EXAMINATION: ECOG PERFORMANCE STATUS: 0 - Asymptomatic  Vitals:    07/02/18 0913  BP: 128/87  Pulse: 68  Resp: 18  Temp: 98.4 F (36.9 C)  SpO2: 100%   Filed Weights   07/02/18 0913  Weight: 230 lb 9.6 oz (104.6 kg)    GENERAL:alert, no distress and comfortable SKIN: skin color, texture, turgor are normal, no rashes or significant lesions EYES: normal, Conjunctiva are pink and non-injected, sclera clear OROPHARYNX:no exudate, no erythema and lips, buccal mucosa, and tongue normal  NECK: supple, thyroid normal size, non-tender, without nodularity LYMPH:  no palpable lymphadenopathy in the cervical, axillary or inguinal LUNGS: clear to auscultation and percussion with normal breathing effort HEART: regular rate & rhythm and no murmurs and no lower extremity edema ABDOMEN:abdomen soft, non-tender and normal bowel sounds Musculoskeletal:no cyanosis of digits and no clubbing  NEURO: alert & oriented x 3 with fluent speech, no focal motor/sensory deficits  LABORATORY DATA:  I have reviewed the data as listed    Component Value Date/Time   NA 142 05/28/2018 0911   NA 141 04/20/2017 1028   K 3.9 05/28/2018 0911   K 4.3 04/20/2017 1028   CL 106 05/28/2018 0911   CO2 26 05/28/2018 0911   CO2 26 04/20/2017 1028   GLUCOSE 97 05/28/2018 0911   GLUCOSE 96 04/20/2017 1028   BUN 11 05/28/2018 0911   BUN 16.7 04/20/2017 1028   CREATININE 1.61 (H) 05/28/2018 0911   CREATININE 1.81 (H) 02/15/2018 1501   CREATININE 2.0 (H) 04/20/2017 1028   CALCIUM 9.0 05/28/2018 0911   CALCIUM 9.0 04/20/2017 1028   PROT 6.9 05/28/2018 0911   PROT 7.0 04/20/2017 1028   ALBUMIN 3.7 05/28/2018 0911   ALBUMIN 4.0 04/20/2017 1028   AST 19 05/28/2018 0911   AST 27 02/15/2018 1501   AST 20 04/20/2017 1028   ALT 10 05/28/2018 0911   ALT 16 02/15/2018 1501   ALT 17 04/20/2017 1028   ALKPHOS 70 05/28/2018 0911   ALKPHOS 61 04/20/2017 1028   BILITOT 0.8 05/28/2018 0911   BILITOT 0.6 02/15/2018 1501   BILITOT 0.49 04/20/2017 1028   GFRNONAA 49 (L) 05/28/2018 0911    GFRNONAA 42 (L) 02/15/2018 1501   GFRAA 57 (L) 05/28/2018 0911   GFRAA 49 (L) 02/15/2018 1501    No results found for: SPEP, UPEP  Lab Results  Component Value Date   WBC 8.1 07/02/2018   NEUTROABS 6.7 07/02/2018   HGB 14.3 07/02/2018   HCT 45.2 07/02/2018   MCV 94.2 07/02/2018   PLT 259 07/02/2018      Chemistry      Component Value Date/Time   NA 142 05/28/2018 0911   NA 141 04/20/2017 1028   K 3.9 05/28/2018 0911   K  4.3 04/20/2017 1028   CL 106 05/28/2018 0911   CO2 26 05/28/2018 0911   CO2 26 04/20/2017 1028   BUN 11 05/28/2018 0911   BUN 16.7 04/20/2017 1028   CREATININE 1.61 (H) 05/28/2018 0911   CREATININE 1.81 (H) 02/15/2018 1501   CREATININE 2.0 (H) 04/20/2017 1028      Component Value Date/Time   CALCIUM 9.0 05/28/2018 0911   CALCIUM 9.0 04/20/2017 1028   ALKPHOS 70 05/28/2018 0911   ALKPHOS 61 04/20/2017 1028   AST 19 05/28/2018 0911   AST 27 02/15/2018 1501   AST 20 04/20/2017 1028   ALT 10 05/28/2018 0911   ALT 16 02/15/2018 1501   ALT 17 04/20/2017 1028   BILITOT 0.8 05/28/2018 0911   BILITOT 0.6 02/15/2018 1501   BILITOT 0.49 04/20/2017 1028     I have reviewed his most recent echocardiogram with the patient All questions were answered. The patient knows to call the clinic with any problems, questions or concerns. No barriers to learning was detected.  I spent 15 minutes counseling the patient face to face. The total time spent in the appointment was 20 minutes and more than 50% was on counseling and review of test results  [name], MD 07/02/2018 2:35 PM

## 2018-07-02 NOTE — Assessment & Plan Note (Signed)
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  We discussed briefly about narcotic refill policy 

## 2018-07-06 ENCOUNTER — Telehealth: Payer: Self-pay | Admitting: Pulmonary Disease

## 2018-07-06 NOTE — Telephone Encounter (Signed)
Unable to reach [facility] 

## 2018-07-10 NOTE — Telephone Encounter (Signed)
I am not sure I understand this. What is the referral for?

## 2018-07-10 NOTE — Telephone Encounter (Signed)
Called patient to get clarification. Unable to reach [facility]

## 2018-07-10 NOTE — Telephone Encounter (Signed)
Message routed to Dr. Elsworth Soho for appointment recommnedation.  Dr. Elsworth Soho, based on message received from patient spouse and the notes from Oncology, can the currently scheduled appointment of 09/25/18 be moved up? Or should it remain as is? Thank you.

## 2018-07-11 ENCOUNTER — Telehealth: Payer: Self-pay | Admitting: Internal Medicine

## 2018-07-11 NOTE — Telephone Encounter (Signed)
Spoke with pt. States that he called the wrong office, he meant to call Cardiology to move up his appointment. He will keep his appointment with our office in June. Nothing further was needed.

## 2018-07-11 NOTE — Telephone Encounter (Signed)
Returned call/ left msg to call me back, gave name and number.

## 2018-07-11 NOTE — Telephone Encounter (Signed)
Pt has been having mild chest pain on and off. He says taking his heart medication helps, but there is a dull pain that is uncomfortable. He feels better after eating. He does not want to expose himself to the public by going to the ED.

## 2018-07-12 ENCOUNTER — Ambulatory Visit (INDEPENDENT_AMBULATORY_CARE_PROVIDER_SITE_OTHER): Payer: Medicaid Other | Admitting: Cardiovascular Disease

## 2018-07-12 ENCOUNTER — Encounter: Payer: Self-pay | Admitting: Cardiovascular Disease

## 2018-07-12 ENCOUNTER — Other Ambulatory Visit: Payer: Self-pay

## 2018-07-12 VITALS — BP 128/84 | HR 80 | Ht 79.0 in | Wt 229.8 lb

## 2018-07-12 DIAGNOSIS — I5022 Chronic systolic (congestive) heart failure: Secondary | ICD-10-CM

## 2018-07-12 DIAGNOSIS — R079 Chest pain, unspecified: Secondary | ICD-10-CM

## 2018-07-12 LAB — TROPONIN T: Troponin T TROPT: <0.011 ng/mL (ref <0.011)

## 2018-07-12 NOTE — Telephone Encounter (Signed)
C/O CP x 1 week, started with left chest- armpit to back pain, started sharp 1 -2 x daily but increased to achy CP all the time. With exertion  8/10 pain, this morning CP woke him up 6/10 cp while laying in bed. Denies SOB,N/V but has had sweating episodes with 9/10 pain.  HX: tonsillary CA , chemo right side and was to see Dr Harrington Challenger for f/u for heart condition post chemo tx.  HX EF% was 15% but last Echo was 06/07/18 with EF% 35-40%,  Pt current with meds, unable to get BP/P for me.   Spoke with DOD Dr Burt Knack, pt is resistant to go to ED with current immune system post Chemo. Pt agreed to come to office now for work up per Dr Burt Knack.

## 2018-07-12 NOTE — Patient Instructions (Signed)
Medication Instructions:  Your provider recommends that you continue on your current medications as directed. Please refer to the Current Medication list given to you today.    Labwork: TODAY: TROPONIN  Testing/Procedures: None  Follow-Up: You have an appointment with Dr. Harrington Challenger on September 17, 2018 at 1:40PM.

## 2018-07-12 NOTE — Progress Notes (Signed)
Cardiology Office Note:    Date:  07/12/2018   ID:  Mason [name], DOB 1965/05/10, MRN [id number]  PCP:  Patient, [name]  Cardiologist:  [name], MD  Electrophysiologist:  None   Referring MD: No ref. provider found   Chief Complaint  Patient presents with  . Chest Pain    History of Present Illness:    Mason [name] is a 53 y.o. male with a hx of nonischemic cardiomyopathy and chronic systolic heart failure, presenting for evaluation of chest pain.   He called in with chest pain this morning. Our normal protocol is ER referral, but considering the Covid-19 pandemic, he did not want to go to the ER. I felt it best to bring him into the office for evaluation since he would potentially have less exposure.  He reports left sided chest discomfort now for about 10 days. He describes under the left axilla, waxing and waning over this time period.  The patient's chest discomfort has been present most of the time over this 10-day period.  States that his pain worsened during sexual activity a few days ago.  Other than that, he has not had exertional related discomfort and he has been able to walk for exercise with no increase in the intensity of his pain.  He also had pain that worsened while he was driving his car and this made him very nervous.  Certain positions change the characteristic of the pain but there is no pleuritic component.  He has had no diaphoresis, dyspnea, lightheadedness, or other associated symptoms.  He otherwise feels well and has been in his normal state of health.  He has been under a lot of stress related to a variety of issues.  Past Medical History:  Diagnosis Date  . Abnormal liver function test   . Acute sinusitis, unspecified 05/18/2015  . Anemia   . Anxiety    mild new dx  . Arthritis    knees,hips  . Bilateral edema of lower extremity   . Complication of anesthesia    Pt stated " my oxygen level was slow in rising."  . Concussion    Hx: in  high school x 2  . Constipation   . Dysuria 09/04/2015  . Fever   . Hyperactive gag reflex   . Hypertension   . Hypoglycemia   . Hypokalemia   . Insomnia 06/15/2015  . Knee pain, chronic   . Malnutrition ([place])   . Non-healing surgical wound 05/23/2014  . PEG (percutaneous endoscopic gastrostomy) status ([place])   . Renal failure, acute ([place])   . S/P radiation therapy 08/19/2013-10/15/2013   Right Tonstil and bilateral neck / 70 Gy in 35 fractions to gross disease, 63 Gy in 35 fractions to high risk nodal echelons, and 56 Gy in 35 fractions to intermediate risk nodal echelons  . Severe nausea and vomiting   . Status post chemotherapy    Only received 2 doses due to uncontrolled nausea and acute renal failure  . 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 LAPAROSCOPIC GASTROSTOMY N/A 08/15/2013   Procedure: LAPAROSCOPIC GASTROSTOMY TUBE PLACEMENT;  Surgeon: [name], MD;  Location: [facility];  Service: General;  Laterality: N/A;  . LYMPH NODE BIOPSY  03/20/14   right neck  . MULTIPLE EXTRACTIONS WITH ALVEOLOPLASTY N/A 08/01/2013   Procedure: Extraction of tooth #'s 1,15,17,31, 32 with alveoloplasty, mandibular left torus  reduction, and gross debridement of remaining teeth.;  Surgeon: [name], DDS;  Location: [facility];  Service: Oral Surgery;  Laterality: N/A;  . PORT-A-CATH REMOVAL N/A 05/13/2014   Procedure: REMOVAL of PORT-A-CATH;  Surgeon: [name], MD;  Location: [facility];  Service: General;  Laterality: N/A;  . PORTACATH PLACEMENT Left 08/15/2013   Procedure: INSERTION PORT-A-CATH;  Surgeon: [name], MD;  Location: [facility];  Service: General;  Laterality: Left;    Current Medications: Current Meds  Medication Sig  . albuterol (PROVENTIL HFA;VENTOLIN HFA) 108 (90 Base) MCG/ACT inhaler Inhale 2 puffs into the lungs every 6 (six) hours as needed for wheezing or shortness of breath.  .  ALPRAZolam (XANAX) 0.5 MG tablet Take 1 tablet (0.5 mg total) by mouth 2 (two) times daily as needed for anxiety.  Marland Kitchen aspirin EC 81 MG tablet Take 81 mg by mouth daily.  . carvedilol (COREG) 3.125 MG tablet TAKE 1 TABLET BY MOUTH TWICE DAILY WITH A MEAL.  . fluticasone (FLONASE) 50 MCG/ACT nasal spray Place 2 sprays into both nostrils daily.  Marland Kitchen levothyroxine (SYNTHROID, LEVOTHROID) 25 MCG tablet TAKE 1 TABLET BY MOUTH DAILY BEFORE BREAKFAST.  Marland Kitchen lidocaine-prilocaine (EMLA) cream Apply 1 application topically as needed.  Marland Kitchen oxyCODONE (ROXICODONE) 15 MG immediate release tablet Take 1 tablet (15 mg total) by mouth every 6 (six) hours as needed.     Allergies:   Bee pollen and Pollen extract   Social History   Socioeconomic History  . Marital status: Married    Spouse name: Not on file  . Number of children: 1  . Years of education: Not on file  . Highest education level: Not on file  Occupational History  . Not on file  Social Needs  . Financial resource strain: Not on file  . Food insecurity:    Worry: Not on file    Inability: Not on file  . Transportation needs:    Medical: Not on file    Non-medical: Not on file  Tobacco Use  . Smoking status: Former Smoker    Packs/day: 1.00    Years: 20.00    Pack years: 20.00    Types: Cigarettes    Last attempt to quit: 07/16/2003    Years since quitting: 15.0  . Smokeless tobacco: Never Used  Substance and Sexual Activity  . Alcohol use: No    Comment: none years ago  . Drug use: Yes    Types: Marijuana, Benzodiazepines    Comment: years ago  . Sexual activity: Never  Lifestyle  . Physical activity:    Days per week: Not on file    Minutes per session: Not on file  . Stress: Not on file  Relationships  . Social connections:    Talks on phone: Not on file    Gets together: Not on file    Attends religious service: Not on file    Active member of club or organization: Not on file    Attends meetings of clubs or organizations:  Not on file    Relationship status: Not on file  Other Topics Concern  . Not on file  Social History Narrative  . Not on file     Family History: The patient's family history includes Arthritis in his father and mother; CVA in his maternal grandmother and unknown relative; Diabetes in his unknown relative; Heart attack in his unknown relative; Heart disease in his maternal grandmother and mother.  ROS:   Please see the history  of present illness.    All other systems reviewed and are negative.  EKGs/Labs/Other Studies Reviewed:    The following studies were reviewed today: 2D echocardiogram 06/07/2018: IMPRESSIONS    1. The left ventricle has moderately reduced systolic function, with an ejection fraction of 35-40%. The cavity size was severely dilated. Left ventricular diastolic Doppler parameters are consistent with impaired relaxation Left ventricular diffuse  hypokinesis.  2. The right ventricle has normal systolic function. The cavity was moderately enlarged. There is no increase in right ventricular wall thickness.  3. The mitral valve is normal in structure.  4. The tricuspid valve is normal in structure.  5. The aortic valve is normal in structure.  6. The pulmonic valve was normal in structure.  7. The inferior vena cava was dilated in size with >50% respiratory variability.  EKG:  EKG is ordered today.  The ekg ordered today demonstrates normal sinus rhythm, heart rate 80 bpm, diffuse T wave abnormality consider inferior and anterolateral ischemia, prolonged QT.  Compared with last tracing, T wave changes are new, QT prolongation is unchanged.  Recent Labs: 11/01/2017: B Natriuretic Peptide 1,018.8; Magnesium 2.1 05/28/2018: ALT 10; BUN 11; Creatinine, Ser 1.61; Potassium 3.9; Sodium 142; TSH 1.875 07/02/2018: Hemoglobin 14.3; Platelets 259  Recent Lipid Panel No results found for: CHOL, TRIG, HDL, CHOLHDL, VLDL, LDLCALC, LDLDIRECT  Physical Exam:    VS:  BP 128/84    Pulse 80   Ht 6\' 7"  (2.007 m)   Wt 229 lb 12.8 oz (104.2 kg)   SpO2 97%   BMI 25.89 kg/m     Wt Readings from Last 3 Encounters:  07/12/18 229 lb 12.8 oz (104.2 kg)  07/02/18 230 lb 9.6 oz (104.6 kg)  06/04/18 227 lb 12.8 oz (103.3 kg)     GEN:  Well nourished, well developed in no acute distress HEENT: Normal NECK: No JVD; No carotid bruits LYMPHATICS: No lymphadenopathy CARDIAC: RRR, no murmurs, rubs, gallops CHEST: There is tenderness over the left pectoralis muscle RESPIRATORY:  Clear to auscultation without rales, wheezing or rhonchi  ABDOMEN: Soft, non-tender, non-distended MUSCULOSKELETAL:  No edema; No deformity  SKIN: Warm and dry NEUROLOGIC:  Alert and oriented x 3 PSYCHIATRIC:  Normal affect   ASSESSMENT:    1. Chest pain, unspecified type   2. Chronic systolic congestive heart failure (HCC)    PLAN:    In order of problems listed above:  1. The patient's pain is atypical.  After reviewing potential etiologies with the patient, I feel like the most likely cause of his chest pain is musculoskeletal.  It is interesting that his EKG has changed compared to baseline and he has a diffuse T wave abnormality noted now.  He understands this is a nonspecific finding and can be seen in ischemic heart disease as well as other types of cardiomyopathy.  I recommended a troponin blood test today which was drawn while he was here in the office.  Otherwise the patient is reassured and we will continue with observation.  We could consider stress testing but I think the probability that his pain reflects myocardial ischemia is very low based on the atypical features.  Will arrange a follow-up visit routinely with Dr. Harrington Challenger in a few months. 2. The patient appears compensated with respect to his cardiomyopathy.  He is taking low-dose carvedilol.  His LVEF has improved after discontinuation of chemotherapy.   Medication Adjustments/Labs and Tests Ordered: Current medicines are reviewed  at length with the patient today.  Concerns regarding medicines are outlined above.  Orders Placed This Encounter  Procedures  . Troponin T  . EKG 12-Lead   No orders of the defined types were placed in this encounter.   Patient Instructions  Medication Instructions:  Your provider recommends that you continue on your current medications as directed. Please refer to the Current Medication list given to you today.    Labwork: TODAY: TROPONIN  Testing/Procedures: None  Follow-Up: You have an appointment with Dr. Harrington Challenger on September 17, 2018 at 1:40PM.    Signed, Sherren Mocha, MD  07/12/2018 12:14 PM    [place]

## 2018-07-25 MED FILL — CARVEDILOL 3.125 MG TABLET: 3.125 | 30 days supply | Qty: 60 | Fill #3

## 2018-07-26 ENCOUNTER — Other Ambulatory Visit: Payer: Self-pay | Admitting: Hematology and Oncology

## 2018-07-26 ENCOUNTER — Telehealth: Payer: Self-pay

## 2018-07-26 DIAGNOSIS — C099 Malignant neoplasm of tonsil, unspecified: Secondary | ICD-10-CM

## 2018-07-26 MED ORDER — OXYCODONE HCL 15 MG PO TABS: 15.0000 mg | ORAL_TABLET | Freq: Four times a day (QID) | ORAL | 0 refills | Status: DC | PRN

## 2018-07-26 MED ORDER — LEVOTHYROXINE SODIUM 25 MCG PO TABS: ORAL_TABLET | ORAL | 1 refills | Status: DC

## 2018-07-26 MED FILL — oxyCODONE HCL 15 MG TABS: 15 | 23 days supply | Qty: 90 | Fill #0

## 2018-07-26 NOTE — Telephone Encounter (Signed)
Msg sent to scheduler dept. Pt made aware to anticipate call from them to reschedule. He says he needs a refill on levothyroxine and hydrocodone

## 2018-07-26 NOTE — Telephone Encounter (Signed)
-----   Message from [name], MD sent at 07/26/2018  9:19 AM EDT ----- Regarding: move appt to first week of May Since he saw cardiologist recently, I suggest we cancel his appt in April and move to May He needs labs, flush and 15 mins appt Please call him

## 2018-07-26 NOTE — Telephone Encounter (Signed)
Both sent to [facility]

## 2018-07-26 NOTE — Telephone Encounter (Signed)
Spoke with pt to let him know that his prescriptions have been sent to [facility].  Due to mail delivery only set up he will need to call [phone] to give payment info then they will mail prescriptions to him. Pt verbalizes understanding of new process

## 2018-07-30 ENCOUNTER — Telehealth: Payer: Self-pay | Admitting: Hematology and Oncology

## 2018-07-30 MED FILL — LEVOTHYROXINE 25 MCG TABLET: 25 | 90 days supply | Qty: 90 | Fill #0

## 2018-07-30 NOTE — Telephone Encounter (Signed)
R/s appt per 4/9 sch message - pt is aware of new appt date and time

## 2018-07-31 ENCOUNTER — Encounter: Payer: Self-pay | Admitting: Pulmonary Disease

## 2018-08-03 ENCOUNTER — Telehealth: Payer: Self-pay | Admitting: Internal Medicine

## 2018-08-03 ENCOUNTER — Ambulatory Visit: Payer: Self-pay | Admitting: Internal Medicine

## 2018-08-03 NOTE — Telephone Encounter (Signed)
New Message:    Pt saw Dr Burt Knack on 07-12-18. He wants to know when does he need his next visit with Dr Harrington Challenger?

## 2018-08-03 NOTE — Telephone Encounter (Signed)
Pt agreeable to VIDEO visit with Dr. Harrington Challenger. Pt states he has no way to take his vitals at home.       Virtual Visit Pre-Appointment Phone Call  Steps For Call:  1. Confirm consent - "In the setting of the current Covid19 crisis, you are scheduled for a VIDEO visit with your provider on 4/23 at 11am.  Just as we do with many in-office visits, in order for you to participate in this visit, we must obtain consent.  If you'd like, I can send this to your mychart (if signed up) or email for you to review.  Otherwise, I can obtain your verbal consent now.  All virtual visits are billed to your insurance company just like a normal visit would be.  By agreeing to a virtual visit, we'd like you to understand that the technology does not allow for your provider to perform an examination, and thus may limit your provider's ability to fully assess your condition. If your provider identifies any concerns that need to be evaluated in person, we will make arrangements to do so.  Finally, though the technology is pretty good, we cannot assure that it will always work on either your or our end, and in the setting of a video visit, we may have to convert it to a phone-only visit.  In either situation, we cannot ensure that we have a secure connection.  Are you willing to proceed?" STAFF: Did the patient verbally acknowledge consent to telehealth visit? Document YES/NO here: YES  2. Confirm the BEST phone number to call the day of the visit by including in appointment notes  3. Give patient instructions for WebEx/MyChart download to smartphone as below or [email or web address] if video visit (depending on what platform provider is using)  4. Advise patient to be prepared with their blood pressure, heart rate, weight, any heart rhythm information, their current medicines, and a piece of paper and pen handy for any instructions they may receive the day of their visit  5. Inform patient they will receive a phone call 15  minutes prior to their appointment time (may be from unknown caller ID) so they should be prepared to answer  6. Confirm that appointment type is correct in Epic appointment notes (VIDEO vs PHONE)     TELEPHONE CALL NOTE  NISHAN OVENS has been deemed a candidate for a follow-up tele-health visit to limit community exposure during the Covid-19 pandemic. I spoke with the patient via phone to ensure availability of phone/video source, confirm preferred email & phone number, and discuss instructions and expectations.  I reminded BRENDIN SITU to be prepared with any vital sign and/or heart rhythm information that could potentially be obtained via home monitoring, at the time of his visit. I reminded ZEDDIE NJIE to expect a phone call at the time of his visit if his visit.  Wilma Flavin, RN 08/03/2018 5:08 PM .

## 2018-08-09 ENCOUNTER — Telehealth (INDEPENDENT_AMBULATORY_CARE_PROVIDER_SITE_OTHER): Payer: Medicaid Other | Admitting: Internal Medicine

## 2018-08-09 ENCOUNTER — Encounter: Payer: Self-pay | Admitting: Internal Medicine

## 2018-08-09 ENCOUNTER — Other Ambulatory Visit: Payer: Self-pay

## 2018-08-09 VITALS — Ht 79.0 in | Wt 235.0 lb

## 2018-08-09 DIAGNOSIS — I5022 Chronic systolic (congestive) heart failure: Secondary | ICD-10-CM | POA: Diagnosis not present

## 2018-08-09 DIAGNOSIS — R0789 Other chest pain: Secondary | ICD-10-CM

## 2018-08-09 DIAGNOSIS — I10 Essential (primary) hypertension: Secondary | ICD-10-CM

## 2018-08-09 DIAGNOSIS — R079 Chest pain, unspecified: Secondary | ICD-10-CM

## 2018-08-09 NOTE — Patient Instructions (Signed)
Medication Instructions:  No changes today If you need a refill on your cardiac medications before your next appointment, please call your pharmacy.   Lab work: BMET--end of June (same day as echocardiogram, we will also plan to check BP that day)  If you have labs (blood work) drawn today and your tests are completely normal, you will receive your results only by: Marland Kitchen MyChart Message (if you have MyChart) OR . A paper copy in the mail If you have any lab test that is abnormal or we need to change your treatment, we will call you to review the results.  Testing/Procedures:     DUE END OF June--BMET, SAME DAY Your physician has requested that you have an echocardiogram. Echocardiography is a painless test that uses sound waves to create images of your heart. It provides your doctor with information about the size and shape of your heart and how well your heart's chambers and valves are working. This procedure takes approximately one hour. There are no restrictions for this procedure.   . Follow-Up: WILL DEPEND ON RESULTS OF ECHO, BLOOD WORK AND BLOOD PRESSURE READING  Any Other Special Instructions Will Be Listed Below (If Applicable).

## 2018-08-09 NOTE — Progress Notes (Signed)
Virtual Visit via Telephone Note   This visit type was conducted due to national recommendations for restrictions regarding the COVID-19 Pandemic (e.g. social distancing) in an effort to limit this patient's exposure and mitigate transmission in our community.  Due to his co-morbid illnesses, this patient is at least at moderate risk for complications without adequate follow up.  This format is felt to be most appropriate for this patient at this time.  The patient did not have access to video technology/had technical difficulties with video requiring transitioning to audio format only (telephone).  All issues noted in this document were discussed and addressed.  No physical exam could be performed with this format.  Please refer to the patient's chart for his  consent to telehealth for [facility].   Evaluation Performed:  Follow-up visit  Date:  08/09/2018   ID:  Mason [name], DOB March 16, 1966, MRN [id number]  Patient Location: Home Provider Location: Home  PCP:  Patient, [name]  Cardiologist:  [name], MD  Electrophysiologist:  None   Chief Complaint:  F/U of systolic CHF and CP   History of Present Illness:    Mason [name] is a 53 y.o. male with NICM  Also a history of tonsillar CA   I saw him at [facility] in July 2019  LVEF was severely down at 15%  RVEF was reduced.   No hx of CP  He was discharged from the hospital after diuresis on carvedilol, hydralazine and NTG     I saw him in clinic in Aug 2019  Right after discharge   He was doing well at time    He was last seen in cardiology clinic in March 2020 for CP by [name]  L sided, waxing/waning for 10 days   Worse during sex, while driving, with changes in position    Echo in Feb 2020  LVEF 35to 40%   RV large but RVEF normal    Pt doing well   Had rare CP since     Appetite good   Feels good   Walks 1.5 miles per day No swelling in ankles or legs   No SOB No salt No dizziness  The patient does not have symptoms  concerning for COVID-19 infection (fever, chills, cough, or new shortness of breath).    Past Medical History:  Diagnosis Date   Abnormal liver function test    Acute sinusitis, unspecified 05/18/2015   Anemia    Anxiety    mild new dx   Arthritis    knees,hips   Bilateral edema of lower extremity    Complication of anesthesia    Pt stated " my oxygen level was slow in rising."   Concussion    Hx: in high school x 2   Constipation    Dysuria 09/04/2015   Fever    Hyperactive gag reflex    Hypertension    Hypoglycemia    Hypokalemia    Insomnia 06/15/2015   Knee pain, chronic    Malnutrition (HCC)    Non-healing surgical wound 05/23/2014   PEG (percutaneous endoscopic gastrostomy) status ([place])    Renal failure, acute ([place])    S/P radiation therapy 08/19/2013-10/15/2013   Right Tonstil and bilateral neck / 70 Gy in 35 fractions to gross disease, 63 Gy in 35 fractions to high risk nodal echelons, and 56 Gy in 35 fractions to intermediate risk nodal echelons   Severe nausea and vomiting    Status post chemotherapy  Only received 2 doses due to uncontrolled nausea and acute renal failure   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, recurrent 2016   Past Surgical History:  Procedure Laterality Date   LAPAROSCOPIC GASTROSTOMY N/A 08/15/2013   Procedure: LAPAROSCOPIC GASTROSTOMY TUBE PLACEMENT;  Surgeon: [name], MD;  Location: [facility];  Service: General;  Laterality: N/A;   LYMPH NODE BIOPSY  03/20/14   right neck   MULTIPLE EXTRACTIONS WITH ALVEOLOPLASTY N/A 08/01/2013   Procedure: Extraction of tooth #'s 1,15,17,31, 32 with alveoloplasty, mandibular left torus reduction, and gross debridement of remaining teeth.;  Surgeon: [name], DDS;  Location: [facility];  Service: Oral Surgery;  Laterality: N/A;   PORT-A-CATH REMOVAL N/A 05/13/2014   Procedure: REMOVAL of PORT-A-CATH;  Surgeon: [name], MD;  Location: [facility];  Service:  General;  Laterality: N/A;   PORTACATH PLACEMENT Left 08/15/2013   Procedure: INSERTION PORT-A-CATH;  Surgeon: [name], MD;  Location: [facility];  Service: General;  Laterality: Left;     Current Meds  Medication Sig   albuterol (PROVENTIL HFA;VENTOLIN HFA) 108 (90 Base) MCG/ACT inhaler Inhale 2 puffs into the lungs every 6 (six) hours as needed for wheezing or shortness of breath.   ALPRAZolam (XANAX) 0.5 MG tablet Take 1 tablet (0.5 mg total) by mouth 2 (two) times daily as needed for anxiety.   aspirin EC 81 MG tablet Take 81 mg by mouth daily.   carvedilol (COREG) 3.125 MG tablet TAKE 1 TABLET BY MOUTH TWICE DAILY WITH A MEAL.   fluticasone (FLONASE) 50 MCG/ACT nasal spray Place 2 sprays into both nostrils daily.   levothyroxine (SYNTHROID, LEVOTHROID) 25 MCG tablet TAKE 1 TABLET BY MOUTH DAILY BEFORE BREAKFAST.   lidocaine-prilocaine (EMLA) cream Apply 1 application topically as needed.   oxyCODONE (ROXICODONE) 15 MG immediate release tablet Take 1 tablet (15 mg total) by mouth every 6 (six) hours as needed.     Allergies:   Bee pollen and Pollen extract   Social History   Tobacco Use   Smoking status: Former Smoker    Packs/day: 1.00    Years: 20.00    Pack years: 20.00    Types: Cigarettes    Last attempt to quit: 07/16/2003    Years since quitting: 15.0   Smokeless tobacco: Never Used  Substance Use Topics   Alcohol use: No    Comment: none years ago   Drug use: Yes    Types: Marijuana, Benzodiazepines    Comment: years ago     Family Hx: The patient's family history includes Arthritis in his father and mother; CVA in his maternal grandmother and unknown relative; Diabetes in his unknown relative; Heart attack in his unknown relative; Heart disease in his maternal grandmother and mother.  ROS:   Please see the history of present illness.     All other systems reviewed and are negative.   Prior CV studies:   The following studies were reviewed  today: As noted above Echo  Labs/Other Tests and Data Reviewed:    EKG:  EKG not done as tele visit  Recent Labs: 11/01/2017: B Natriuretic Peptide 1,018.8; Magnesium 2.1 05/28/2018: ALT 10; BUN 11; Creatinine, Ser 1.61; Potassium 3.9; Sodium 142; TSH 1.875 07/02/2018: Hemoglobin 14.3; Platelets 259   Recent Lipid Panel No results found for: CHOL, TRIG, HDL, CHOLHDL, LDLCALC, LDLDIRECT  Wt Readings from Last 3 Encounters:  08/09/18 235 lb (106.6 kg)  07/12/18 229 lb 12.8 oz (104.2 kg)  07/02/18 230  lb 9.6 oz (104.6 kg)     Objective:    Vital Signs:  Ht 6\' 7"  (2.007 m)    Wt 235 lb (106.6 kg)    BMI 26.47 kg/m   NOt done as televisit  ASSESSMENT & PLAN:    1. Chronic systolic CHF  Presumed nonischemic   Denies CP  Review with pt he appears to be doing well   Volume sounds like it is good He was taken off of hydralazine and NTG   Need to review why WOuld recomm repeat echo and labs in June (not emergent) to reassess LVEF     Medicine changes based on results  I  2  Chest pain  Atypical  Gone  3  CKD   Will repeat BMET in JUne  4   COVID-19 Education: The signs and symptoms of COVID-19 were discussed with the patient and how to seek care for testing (follow up with PCP or arrange E-visit).The importance of social distancing was discussed today.  Time:   Today, I have spent 25 minutes with the patient with telehealth technology discussing the above problems.   che  Medication Adjustments/Labs and Tests Ordered: Current medicines are reviewed at length with the patient today.  Concerns regarding medicines are outlined above.   Tests Ordered: No orders of the defined types were placed in this encounter.   Medication Changes: No orders of the defined types were placed in this encounter.   Disposition:  Follow up labs in June Echo in June Signed, [name], MD  08/09/2018 11:09 AM    [place]

## 2018-08-13 ENCOUNTER — Ambulatory Visit: Payer: Self-pay | Admitting: Hematology and Oncology

## 2018-08-13 ENCOUNTER — Other Ambulatory Visit: Payer: Self-pay

## 2018-08-17 ENCOUNTER — Telehealth: Payer: Self-pay

## 2018-08-17 NOTE — Telephone Encounter (Signed)
He called and left a message to call him.  Called back. He is asking for refill of Oxycodone be sent to [facility] outpatient pharmacy on Monday.

## 2018-08-20 ENCOUNTER — Inpatient Hospital Stay: Payer: Medicaid Other

## 2018-08-20 ENCOUNTER — Inpatient Hospital Stay: Payer: Medicaid Other | Attending: Hematology and Oncology | Admitting: Hematology and Oncology

## 2018-08-20 ENCOUNTER — Encounter: Payer: Self-pay | Admitting: Hematology and Oncology

## 2018-08-20 ENCOUNTER — Other Ambulatory Visit: Payer: Self-pay

## 2018-08-20 ENCOUNTER — Telehealth: Payer: Self-pay | Admitting: *Deleted

## 2018-08-20 DIAGNOSIS — Z5111 Encounter for antineoplastic chemotherapy: Secondary | ICD-10-CM

## 2018-08-20 DIAGNOSIS — Z79899 Other long term (current) drug therapy: Secondary | ICD-10-CM | POA: Insufficient documentation

## 2018-08-20 DIAGNOSIS — Z7951 Long term (current) use of inhaled steroids: Secondary | ICD-10-CM | POA: Insufficient documentation

## 2018-08-20 DIAGNOSIS — I5022 Chronic systolic (congestive) heart failure: Secondary | ICD-10-CM

## 2018-08-20 DIAGNOSIS — M542 Cervicalgia: Secondary | ICD-10-CM | POA: Insufficient documentation

## 2018-08-20 DIAGNOSIS — C099 Malignant neoplasm of tonsil, unspecified: Secondary | ICD-10-CM

## 2018-08-20 DIAGNOSIS — G893 Neoplasm related pain (acute) (chronic): Secondary | ICD-10-CM | POA: Insufficient documentation

## 2018-08-20 DIAGNOSIS — E039 Hypothyroidism, unspecified: Secondary | ICD-10-CM | POA: Diagnosis not present

## 2018-08-20 DIAGNOSIS — C090 Malignant neoplasm of tonsillar fossa: Secondary | ICD-10-CM

## 2018-08-20 DIAGNOSIS — Z7982 Long term (current) use of aspirin: Secondary | ICD-10-CM | POA: Diagnosis not present

## 2018-08-20 DIAGNOSIS — C770 Secondary and unspecified malignant neoplasm of lymph nodes of head, face and neck: Secondary | ICD-10-CM

## 2018-08-20 LAB — CBC WITH DIFFERENTIAL/PLATELET
Abs Immature Granulocytes: 0.02 10*3/uL (ref 0.00–0.07)
Basophils Absolute: 0 10*3/uL (ref 0.0–0.1)
Basophils Relative: 0 %
Eosinophils Absolute: 0.1 10*3/uL (ref 0.0–0.5)
Eosinophils Relative: 2 %
HCT: 47.2 % (ref 39.0–52.0)
Hemoglobin: 14.8 g/dL (ref 13.0–17.0)
Immature Granulocytes: 0 %
Lymphocytes Relative: 10 %
Lymphs Abs: 0.7 10*3/uL (ref 0.7–4.0)
MCH: 29 pg (ref 26.0–34.0)
MCHC: 31.4 g/dL (ref 30.0–36.0)
MCV: 92.4 fL (ref 80.0–100.0)
Monocytes Absolute: 0.7 10*3/uL (ref 0.1–1.0)
Monocytes Relative: 11 %
Neutro Abs: 5.3 10*3/uL (ref 1.7–7.7)
Neutrophils Relative %: 77 %
Platelets: 217 10*3/uL (ref 150–400)
RBC: 5.11 MIL/uL (ref 4.22–5.81)
RDW: 14 % (ref 11.5–15.5)
WBC: 6.9 10*3/uL (ref 4.0–10.5)
nRBC: 0 % (ref 0.0–0.2)

## 2018-08-20 LAB — COMPREHENSIVE METABOLIC PANEL
ALT: 13 U/L (ref 0–44)
AST: 25 U/L (ref 15–41)
Albumin: 3.9 g/dL (ref 3.5–5.0)
Alkaline Phosphatase: 73 U/L (ref 38–126)
Anion gap: 9 (ref 5–15)
BUN: 16 mg/dL (ref 6–20)
CO2: 26 mmol/L (ref 22–32)
Calcium: 8.8 mg/dL — ABNORMAL LOW (ref 8.9–10.3)
Chloride: 107 mmol/L (ref 98–111)
Creatinine, Ser: 1.59 mg/dL — ABNORMAL HIGH (ref 0.61–1.24)
GFR calc Af Amer: 57 mL/min — ABNORMAL LOW (ref >60)
GFR calc non Af Amer: 50 mL/min — ABNORMAL LOW (ref >60)
Glucose, Bld: 81 mg/dL (ref 70–99)
Potassium: 4.6 mmol/L (ref 3.5–5.1)
Sodium: 142 mmol/L (ref 135–145)
Total Bilirubin: 0.4 mg/dL (ref 0.3–1.2)
Total Protein: 7.6 g/dL (ref 6.5–8.1)

## 2018-08-20 LAB — TSH: TSH: 2.083 u[IU]/mL (ref 0.320–4.118)

## 2018-08-20 MED ORDER — OXYCODONE HCL 15 MG PO TABS: 15.0000 mg | ORAL_TABLET | Freq: Four times a day (QID) | ORAL | 0 refills | Status: DC | PRN

## 2018-08-20 MED FILL — oxyCODONE HCL 15 MG TABS: 15 | 22 days supply | Qty: 90 | Fill #0

## 2018-08-20 NOTE — Progress Notes (Signed)
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 Clinically, he does not have signs or symptoms of congestive heart failure Repeat echocardiogram showed improvement/stable ejection fraction I reminded him the importance of making appointment to follow with his cardiologist for medical management  Acquired hypothyroidism He has acquired hypothyroidism secondary to treatment We will continue to monitor his TSH and adjust medication prn   No orders of the defined types were placed in this encounter.   INTERVAL HISTORY: Please see below for problem oriented charting. 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 FNA of right level III lymph node was inconclusive for cancer    07/25/2013 Imaging    PET scan showed locally advanced disease with abnormal lymphadenopathy in the right axilla    08/06/2013 Procedure    CT-guided biopsy of the lymphadenopathy was negative for malignancy    08/15/2013 Surgery    Patient has placement of port and feeding tube    08/19/2013 - 09/10/2013 Chemotherapy    Patient received chemotherapy with cisplatin. The patient only received 2 doses due to uncontrolled nausea and acute renal failure.    08/19/2013 - 10/15/2013 Radiation Therapy    Received Helical IMRT Tomotherapy:  Right Tonstil and bilateral neck / 70 Gy in 35 fractions to gross disease, 63 Gy in 35 fractions to high risk nodal echelons, and 56 Gy in 35 fractions to intermediate risk nodal echelons.    08/27/2013 - 08/30/2013 Hospital Admission    The patient was admitted to the hospital for uncontrolled nausea vomiting and dehydration.    02/14/2014 Imaging    PET/CT scan showed complete response to treatment    03/19/2014 Surgery    He had excisional lymph node biopsy from the right neck. Pathology was benign    05/13/2014 Surgery    He had removal of Port-A-Cath.    05/22/2014 Imaging    Repeat  CT scan of the neck show no evidence of disease recurrence.    12/09/2014 Imaging    Ct neck without contrast showed persistent abnormalities on the right side of neck, indeterminate    12/25/2014 Imaging    PET CT scan showed disease recurrence.    02/10/2015 Procedure    He has placement of port     02/13/2015 - 07/13/2015 Chemotherapy    He received chemotherapy with carbo/Taxol    04/21/2015 Imaging    PET CT scan showed improved disease control    08/04/2015 Imaging    PET scan showed persistent disease    08/17/2015 -  Chemotherapy    He was started with [facility]    10/19/2015 Imaging    Ct neck showed mass-like intermediate density soft tissue at the right lateral neck recurrence site stable.     02/26/2016 Imaging    CT neck showed unchanged appearance of right neck recurrence compared to 10/19/2015 CT. No noncontrast evidence of new metastatic disease in the neck. Chronic sinusitis, progressed.    02/26/2016 Imaging    Diffuse but patchy and asymmetric partial airspace filling process (ground-glass opacity) in the lungs. This could be due to respiratory bronchiolitis, hypersensitivity pneumonitis or possible drug reaction. Atypical/viral pneumonia is also possible. Pulmonary consultation may be a helpful. A three-month follow-up noncontrast chest CT is suggested. Slight interval enlargement of mediastinal lymph nodes and a small lymph node along the left major fissure. This is most likely due to the inflammatory process in the lungs. No findings for metastatic disease involving the chest. No findings for upper abdominal metastatic disease.    02/29/2016 Adverse Reaction    His treatment is placed on hold due to possible hypersensitivity pneumonitis/drug reaction    04/14/2016 Imaging    Ct chest showed no evidence for metastatic disease within the chest. Significant interval improvement and near complete resolution of previously described diffuse bilateral predominately ground-glass pulmonary opacities, most compatible with resolving infectious/inflammatory process.    09/05/2016 Imaging    CT scan of neck and chest  1. Unchanged appearance of right neck recurrence. 2. No evidence of new metastatic disease in the neck. 3. Unremarkable and stable CT appearance of the chest. No  findings suspicious for metastatic disease    07/24/2017 Imaging    1. No findings suspicious for metastatic disease in the chest. 2. No acute consolidative airspace disease to suggest a pneumonia. 3. No appreciable change in chronic mild patchy upper lung predominant centrilobular micronodularity. If the patient is a current smoker, these findings are most compatible with smoking related interstitial lung disease. If the patient is not a current smoker, these findings suggest subacute hypersensitivity pneumonitis  or postinflammatory change. 4. Stable mild biapical radiation fibrosis. 5. Mild to moderate centrilobular emphysema with diffuse bronchial wall thickening, suggesting COPD. 6. One vessel coronary atherosclerosis.  Aortic Atherosclerosis (ICD10-I70.0) and Emphysema (ICD10-J43.9).    09/15/2017 Imaging    1. No evidence of interstitial lung disease. 2. Emphysema (ICD10-J43.9).    10/29/2017 Imaging    1. Moderate quality exam for pulmonary embolism with primary limitation of motion. No embolism identified. 2. Findings of congestive heart failure, including bilateral pleural effusions and septal thickening. 3. New thoracic adenopathy since approximately 6 weeks ago. Favor secondary to fluid overload/congestive heart failure. 4. New left apical 4 mm pulmonary nodule. Favored to represent a subpleural lymph node. Non-contrast chest CT can be considered in 12 months, given risk factors for primary bronchogenic carcinoma. This recommendation follows the consensus  statement: Guidelines for Management of Incidental Pulmonary Nodules Detected on CT Images: From the Fleischner Society 2017; Radiology 2017; 546:568-127.     10/29/2017 - 11/02/2017 Hospital Admission    He was admitted to the hospital due to shortness of breath and was found to have congestive heart failure.    10/30/2017 Imaging    Definity used; severe global reduction in LV systolic function; severe LVE; restrictive filling;  mild MR; mild LAE; mild RVE with moderate RV dysfunction; mild TR with moderate pulmonary hypertension.    01/26/2018 Imaging    1. Regression of soft tissue in the postoperative right neck favoring scarring. No new or progressive finding to suggest recurrent disease. 2. Chest CT reported separately    01/26/2018 Imaging    1. No evidence of thoracic metastasis. 2. Port in the anterior chest wall with tip in distal     03/12/2018 Imaging    1. Trace, silent aspiration noted after a swallow consistent with aspiration of residual barium in the hypopharynx. 2. Retrograde reflux of contrast into the posterior nasopharynx with swallowing. 3. No gross esophageal abnormality 4. Initial difficulty swallowing a 13 mm barium tablet that than passes readily into the stomach once it is swallowed.    05/31/2018 Imaging    Resolution of previously seen mucosal hyperenhancement in the pharynx and larynx. Otherwise unchanged examination of the neck without evidence of recurrent disease or cervical nodal metastases.     06/07/2018 Echocardiogram    1. The left ventricle has moderately reduced systolic function, with an ejection fraction of 35-40%. The cavity size was severely dilated. Left ventricular diastolic Doppler parameters are consistent with impaired relaxation Left ventricular diffuse hypokinesis.  2. The right ventricle has normal systolic function. The cavity was moderately enlarged. There is no increase in right ventricular wall thickness.  3. The mitral valve is normal in structure.  4. The tricuspid valve is normal in structure.  5. The aortic valve is normal in structure.  6. The pulmonic valve was normal in structure.  7. The inferior vena cava was dilated in size with >50% respiratory variability.     REVIEW OF SYSTEMS:   Constitutional: Denies fevers, chills or abnormal weight loss Eyes: Denies blurriness of vision Ears, nose, mouth, throat, and face: Denies mucositis or sore  throat Respiratory: Denies cough, dyspnea or wheezes Cardiovascular: Denies palpitation, chest discomfort or lower extremity swelling Gastrointestinal:  Denies nausea, heartburn or change in bowel habits Skin: Denies abnormal skin rashes Lymphatics: Denies new lymphadenopathy or easy bruising Neurological:Denies numbness, tingling or new weaknesses Behavioral/Psych: Mood is stable, no new changes  All other systems were reviewed with the patient and are negative.  I have reviewed the past medical history, past surgical history, social history and family history with the patient and they are unchanged from previous note.  ALLERGIES:  is allergic to bee pollen and pollen extract.  MEDICATIONS:  Current Outpatient Medications  Medication Sig Dispense Refill  . albuterol (PROVENTIL HFA;VENTOLIN HFA) 108 (90 Base) MCG/ACT inhaler Inhale 2 puffs into the lungs every 6 (six) hours as needed for wheezing or shortness of breath. 1 Inhaler 6  . ALPRAZolam (XANAX) 0.5 MG tablet Take 1 tablet (0.5 mg total) by mouth 2 (two) times daily as needed for anxiety. 60 tablet 0  . aspirin EC 81 MG tablet Take 81 mg by mouth daily.    . carvedilol (COREG) 3.125 MG tablet TAKE 1 TABLET BY MOUTH TWICE DAILY WITH A MEAL. 60 tablet 7  .  fluticasone (FLONASE) 50 MCG/ACT nasal spray Place 2 sprays into both nostrils daily. 16 g 2  . levothyroxine (SYNTHROID, LEVOTHROID) 25 MCG tablet TAKE 1 TABLET BY MOUTH DAILY BEFORE BREAKFAST. 90 tablet 1  . lidocaine-prilocaine (EMLA) cream Apply 1 application topically as needed. 30 g 6  . oxyCODONE (ROXICODONE) 15 MG immediate release tablet Take 1 tablet (15 mg total) by mouth every 6 (six) hours as needed. 90 tablet 0   No current facility-administered medications for this visit.    Facility-Administered Medications Ordered in Other Visits  Medication Dose Route Frequency Provider Last Rate Last Dose  . sodium chloride 0.9 % injection 10 mL  10 mL Intravenous PRN Alvy Bimler,  Keyron Pokorski, MD   10 mL at 11/29/16 0818  . sodium chloride 0.9 % injection 10 mL  10 mL Intravenous PRN Alvy Bimler, Delawrence Fridman, MD        PHYSICAL EXAMINATION: ECOG PERFORMANCE STATUS: 1 - Symptomatic but completely ambulatory  Vitals:   08/20/18 1050  BP: (!) 143/87  Pulse: 64  Resp: 18  Temp: 97.6 F (36.4 C)  SpO2: 99%   Filed Weights   08/20/18 1050  Weight: 234 lb 6.4 oz (106.3 kg)    GENERAL:alert, no distress and comfortable SKIN: skin color, texture, turgor are normal, no rashes or significant lesions EYES: normal, Conjunctiva are pink and non-injected, sclera clear OROPHARYNX:no exudate, no erythema and lips, buccal mucosa, and tongue normal  NECK: Well-healed surgical scar on the right without any new nodularity LYMPH:  no palpable lymphadenopathy in the cervical, axillary or inguinal LUNGS: clear to auscultation and percussion with normal breathing effort HEART: regular rate & rhythm and no murmurs and no lower extremity edema ABDOMEN:abdomen soft, non-tender and normal bowel sounds Musculoskeletal:no cyanosis of digits and no clubbing  NEURO: alert & oriented x 3 with fluent speech, no focal motor/sensory deficits  LABORATORY DATA:  I have reviewed the data as listed    Component Value Date/Time   NA 142 05/28/2018 0911   NA 141 04/20/2017 1028   K 3.9 05/28/2018 0911   K 4.3 04/20/2017 1028   CL 106 05/28/2018 0911   CO2 26 05/28/2018 0911   CO2 26 04/20/2017 1028   GLUCOSE 97 05/28/2018 0911   GLUCOSE 96 04/20/2017 1028   BUN 11 05/28/2018 0911   BUN 16.7 04/20/2017 1028   CREATININE 1.61 (H) 05/28/2018 0911   CREATININE 1.81 (H) 02/15/2018 1501   CREATININE 2.0 (H) 04/20/2017 1028   CALCIUM 9.0 05/28/2018 0911   CALCIUM 9.0 04/20/2017 1028   PROT 6.9 05/28/2018 0911   PROT 7.0 04/20/2017 1028   ALBUMIN 3.7 05/28/2018 0911   ALBUMIN 4.0 04/20/2017 1028   AST 19 05/28/2018 0911   AST 27 02/15/2018 1501   AST 20 04/20/2017 1028   ALT 10 05/28/2018 0911   ALT 16  02/15/2018 1501   ALT 17 04/20/2017 1028   ALKPHOS 70 05/28/2018 0911   ALKPHOS 61 04/20/2017 1028   BILITOT 0.8 05/28/2018 0911   BILITOT 0.6 02/15/2018 1501   BILITOT 0.49 04/20/2017 1028   GFRNONAA 49 (L) 05/28/2018 0911   GFRNONAA 42 (L) 02/15/2018 1501   GFRAA 57 (L) 05/28/2018 0911   GFRAA 49 (L) 02/15/2018 1501    No results found for: SPEP, UPEP  Lab Results  Component Value Date   WBC 6.9 08/20/2018   NEUTROABS 5.3 08/20/2018   HGB 14.8 08/20/2018   HCT 47.2 08/20/2018   MCV 92.4 08/20/2018   PLT 217 08/20/2018  Chemistry      Component Value Date/Time   NA 142 05/28/2018 0911   NA 141 04/20/2017 1028   K 3.9 05/28/2018 0911   K 4.3 04/20/2017 1028   CL 106 05/28/2018 0911   CO2 26 05/28/2018 0911   CO2 26 04/20/2017 1028   BUN 11 05/28/2018 0911   BUN 16.7 04/20/2017 1028   CREATININE 1.61 (H) 05/28/2018 0911   CREATININE 1.81 (H) 02/15/2018 1501   CREATININE 2.0 (H) 04/20/2017 1028      Component Value Date/Time   CALCIUM 9.0 05/28/2018 0911   CALCIUM 9.0 04/20/2017 1028   ALKPHOS 70 05/28/2018 0911   ALKPHOS 61 04/20/2017 1028   AST 19 05/28/2018 0911   AST 27 02/15/2018 1501   AST 20 04/20/2017 1028   ALT 10 05/28/2018 0911   ALT 16 02/15/2018 1501   ALT 17 04/20/2017 1028   BILITOT 0.8 05/28/2018 0911   BILITOT 0.6 02/15/2018 1501   BILITOT 0.49 04/20/2017 1028      All questions were answered. The patient knows to call the clinic with any problems, questions or concerns. No barriers to learning was detected.  I spent 15 minutes counseling the patient face to face. The total time spent in the appointment was 20 minutes and more than 50% was on counseling and review of test results  [name], MD 08/20/2018 11:08 AM

## 2018-08-20 NOTE — Assessment & Plan Note (Signed)
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 Clinically, he does not have signs or symptoms of congestive heart failure Repeat echocardiogram showed improvement/stable ejection fraction I reminded him the importance of making appointment to follow with his cardiologist for medical management

## 2018-08-20 NOTE — Assessment & Plan Note (Signed)
He has acquired hypothyroidism secondary to treatment We will continue to monitor his TSH and adjust medication prn 

## 2018-08-20 NOTE — Telephone Encounter (Signed)
Called and gave the patient the appts for 6/29. Also explained that the appts will show up in my chart

## 2018-08-20 NOTE — Assessment & Plan Note (Signed)
His last Ct imaging showed no signs of cancer We will discontinue his treatment permanently due to significant immune mediated reactions which has since resolved I recommend maintaining port patency with port flushes every 8 weeks I will see him back again in 2 months for further follow-up

## 2018-08-20 NOTE — Telephone Encounter (Signed)
Can you ask him if he has enough until his appt tomorrow? I want to discuss with him first If not enough, can he come in today instead?

## 2018-08-20 NOTE — Telephone Encounter (Signed)
He is coming in today at 10 for labs and I will do the flush in one of the exam rooms. He is on the schedule to see you at 1045.

## 2018-08-20 NOTE — Assessment & Plan Note (Signed)
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  We discussed briefly about narcotic refill policy

## 2018-08-21 ENCOUNTER — Other Ambulatory Visit: Payer: Self-pay

## 2018-08-21 ENCOUNTER — Ambulatory Visit: Payer: Self-pay | Admitting: Hematology and Oncology

## 2018-09-04 MED FILL — CARVEDILOL 3.125 MG TABLET: 3.125 | 30 days supply | Qty: 60 | Fill #4

## 2018-09-11 ENCOUNTER — Telehealth: Payer: Self-pay

## 2018-09-11 ENCOUNTER — Other Ambulatory Visit: Payer: Self-pay | Admitting: Hematology and Oncology

## 2018-09-11 DIAGNOSIS — C099 Malignant neoplasm of tonsil, unspecified: Secondary | ICD-10-CM

## 2018-09-11 MED ORDER — OXYCODONE HCL 15 MG PO TABS: 15.0000 mg | ORAL_TABLET | Freq: Four times a day (QID) | ORAL | 0 refills | Status: DC | PRN

## 2018-09-11 MED FILL — oxyCODONE HCL 15 MG TABS: 15 | 22 days supply | Qty: 90 | Fill #0

## 2018-09-11 NOTE — Telephone Encounter (Signed)
done

## 2018-09-11 NOTE — Telephone Encounter (Signed)
He called and left a message. Requesting refill on Oxycodone. Ask that Rx be sent to [facility] outpatient pharmacy.

## 2018-09-11 NOTE — Telephone Encounter (Signed)
Called and given below message. He verbalized understanding. 

## 2018-09-17 ENCOUNTER — Ambulatory Visit: Payer: Self-pay | Admitting: Internal Medicine

## 2018-09-25 ENCOUNTER — Ambulatory Visit: Payer: Self-pay | Admitting: Pulmonary Disease

## 2018-10-09 ENCOUNTER — Telehealth: Payer: Self-pay

## 2018-10-09 NOTE — Telephone Encounter (Signed)
He called and left a message to call him.  Called back. He is requesting a refill on synthroid and oxycodone Rx's. Ask that they be sent to [facility] outpatient.  He is going back to work next Tuesday. If Dr. Alvy Bimler has a cancellation this week he is willing to come in this week instead of 6/29. He is just asking.

## 2018-10-10 ENCOUNTER — Inpatient Hospital Stay: Payer: Medicaid Other

## 2018-10-10 ENCOUNTER — Inpatient Hospital Stay: Payer: Medicaid Other | Attending: Hematology and Oncology | Admitting: Hematology and Oncology

## 2018-10-10 ENCOUNTER — Other Ambulatory Visit: Payer: Self-pay | Admitting: Hematology and Oncology

## 2018-10-10 ENCOUNTER — Encounter: Payer: Self-pay | Admitting: Hematology and Oncology

## 2018-10-10 ENCOUNTER — Other Ambulatory Visit: Payer: Self-pay

## 2018-10-10 ENCOUNTER — Ambulatory Visit (HOSPITAL_COMMUNITY)
Admission: RE | Admit: 2018-10-10 | Discharge: 2018-10-10 | Disposition: A | Discharge status: Home / Self Care | Payer: Medicaid Other | Source: Ambulatory Visit | Attending: Internal Medicine | Admitting: Internal Medicine

## 2018-10-10 DIAGNOSIS — Z5111 Encounter for antineoplastic chemotherapy: Secondary | ICD-10-CM

## 2018-10-10 DIAGNOSIS — C099 Malignant neoplasm of tonsil, unspecified: Secondary | ICD-10-CM

## 2018-10-10 DIAGNOSIS — Z7951 Long term (current) use of inhaled steroids: Secondary | ICD-10-CM | POA: Diagnosis not present

## 2018-10-10 DIAGNOSIS — C090 Malignant neoplasm of tonsillar fossa: Secondary | ICD-10-CM

## 2018-10-10 DIAGNOSIS — E039 Hypothyroidism, unspecified: Secondary | ICD-10-CM | POA: Insufficient documentation

## 2018-10-10 DIAGNOSIS — I5022 Chronic systolic (congestive) heart failure: Secondary | ICD-10-CM | POA: Insufficient documentation

## 2018-10-10 DIAGNOSIS — Z95828 Presence of other vascular implants and grafts: Secondary | ICD-10-CM

## 2018-10-10 DIAGNOSIS — Z79899 Other long term (current) drug therapy: Secondary | ICD-10-CM | POA: Diagnosis not present

## 2018-10-10 DIAGNOSIS — G893 Neoplasm related pain (acute) (chronic): Secondary | ICD-10-CM

## 2018-10-10 DIAGNOSIS — F419 Anxiety disorder, unspecified: Secondary | ICD-10-CM | POA: Insufficient documentation

## 2018-10-10 DIAGNOSIS — I110 Hypertensive heart disease with heart failure: Secondary | ICD-10-CM | POA: Insufficient documentation

## 2018-10-10 DIAGNOSIS — Z7982 Long term (current) use of aspirin: Secondary | ICD-10-CM | POA: Diagnosis not present

## 2018-10-10 DIAGNOSIS — R112 Nausea with vomiting, unspecified: Secondary | ICD-10-CM

## 2018-10-10 DIAGNOSIS — N182 Chronic kidney disease, stage 2 (mild): Secondary | ICD-10-CM | POA: Diagnosis not present

## 2018-10-10 LAB — CBC WITH DIFFERENTIAL/PLATELET
Abs Immature Granulocytes: 0.03 10*3/uL (ref 0.00–0.07)
Basophils Absolute: 0 10*3/uL (ref 0.0–0.1)
Basophils Relative: 0 %
Eosinophils Absolute: 0.2 10*3/uL (ref 0.0–0.5)
Eosinophils Relative: 2 %
HCT: 46.3 % (ref 39.0–52.0)
Hemoglobin: 14.6 g/dL (ref 13.0–17.0)
Immature Granulocytes: 0 %
Lymphocytes Relative: 9 %
Lymphs Abs: 0.8 10*3/uL (ref 0.7–4.0)
MCH: 29.7 pg (ref 26.0–34.0)
MCHC: 31.5 g/dL (ref 30.0–36.0)
MCV: 94.1 fL (ref 80.0–100.0)
Monocytes Absolute: 0.7 10*3/uL (ref 0.1–1.0)
Monocytes Relative: 8 %
Neutro Abs: 7.2 10*3/uL (ref 1.7–7.7)
Neutrophils Relative %: 81 %
Platelets: 185 10*3/uL (ref 150–400)
RBC: 4.92 MIL/uL (ref 4.22–5.81)
RDW: 15.9 % — ABNORMAL HIGH (ref 11.5–15.5)
WBC: 8.9 10*3/uL (ref 4.0–10.5)
nRBC: 0 % (ref 0.0–0.2)

## 2018-10-10 LAB — CMP (CANCER CENTER ONLY)
ALT: 16 U/L (ref 0–44)
AST: 24 U/L (ref 15–41)
Albumin: 3.9 g/dL (ref 3.5–5.0)
Alkaline Phosphatase: 66 U/L (ref 38–126)
Anion gap: 10 (ref 5–15)
BUN: 15 mg/dL (ref 6–20)
CO2: 26 mmol/L (ref 22–32)
Calcium: 8.8 mg/dL — ABNORMAL LOW (ref 8.9–10.3)
Chloride: 104 mmol/L (ref 98–111)
Creatinine: 1.73 mg/dL — ABNORMAL HIGH (ref 0.61–1.24)
GFR, Est AFR Am: 52 mL/min — ABNORMAL LOW (ref >60)
GFR, Estimated: 45 mL/min — ABNORMAL LOW (ref >60)
Glucose, Bld: 83 mg/dL (ref 70–99)
Potassium: 4.2 mmol/L (ref 3.5–5.1)
Sodium: 140 mmol/L (ref 135–145)
Total Bilirubin: 0.6 mg/dL (ref 0.3–1.2)
Total Protein: 7.2 g/dL (ref 6.5–8.1)

## 2018-10-10 LAB — ECHOCARDIOGRAM COMPLETE
Height: 79 in
Weight: 3875.2 oz

## 2018-10-10 LAB — TSH: TSH: 3.601 u[IU]/mL (ref 0.320–4.118)

## 2018-10-10 MED ORDER — HEPARIN SOD (PORK) LOCK FLUSH 100 UNIT/ML IV SOLN
500.0000 [IU] | Freq: Once | INTRAVENOUS | Status: AC | PRN
  Administered 2018-10-10: 500 [IU] via INTRAVENOUS
  Filled 2018-10-10: qty 5

## 2018-10-10 MED ORDER — OXYCODONE HCL 15 MG PO TABS: 15.0000 mg | ORAL_TABLET | Freq: Four times a day (QID) | ORAL | 0 refills | Status: DC | PRN

## 2018-10-10 MED ORDER — SODIUM CHLORIDE 0.9% FLUSH
10.0000 mL | Freq: Once | INTRAVENOUS | Status: AC
  Administered 2018-10-10: 10 mL
  Filled 2018-10-10: qty 10

## 2018-10-10 MED ORDER — LEVOTHYROXINE SODIUM 25 MCG PO TABS: ORAL_TABLET | ORAL | 1 refills | Status: DC

## 2018-10-10 MED FILL — oxyCODONE HCL 15 MG TABS: 15 | 22 days supply | Qty: 90 | Fill #0

## 2018-10-10 NOTE — Assessment & Plan Note (Signed)
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  We discussed briefly about narcotic refill policy

## 2018-10-10 NOTE — Assessment & Plan Note (Signed)
His last Ct imaging showed no signs of cancer We will discontinue his treatment permanently due to significant immune mediated reactions which has since resolved I recommend maintaining port patency with port flushes every 8 weeks I will see him back again in 4 months for further follow-up I recommend annual surveillance CT imaging unless he have new signs and symptoms to suggest cancer recurrence

## 2018-10-10 NOTE — Progress Notes (Signed)
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 We will discontinue his treatment permanently due to significant immune mediated reactions which has since resolved I recommend maintaining port patency with port flushes every 8 weeks I will see him back again in 4 months for further follow-up I recommend annual surveillance CT imaging unless he have new signs and symptoms to suggest cancer recurrence  Chronic kidney disease (CKD), stage II (mild) He has stable chronic kidney disease since chemotherapy Observe closely  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 Clinically, he does not have signs or symptoms of congestive heart failure He will continue medical management and follow-up with cardiologist  Acquired hypothyroidism He has acquired hypothyroidism secondary to treatment We will continue to monitor his TSH and adjust medication prn   No orders of the defined types were placed in this encounter.   INTERVAL HISTORY: Please see below for problem oriented charting. 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 FNA of right level III lymph node was inconclusive for cancer   07/25/2013 Imaging   PET scan showed locally advanced disease with abnormal lymphadenopathy in the right axilla   08/06/2013 Procedure   CT-guided biopsy of the lymphadenopathy was negative for malignancy   08/15/2013 Surgery   Patient has placement of port and feeding tube   08/19/2013 - 09/10/2013 Chemotherapy   Patient received chemotherapy with cisplatin. The patient only received 2 doses due to uncontrolled nausea and acute renal failure.   08/19/2013 - 10/15/2013 Radiation Therapy   Received Helical IMRT Tomotherapy:  Right Tonstil and bilateral neck / 70 Gy in 35 fractions to gross disease, 63 Gy in 35 fractions to high risk nodal echelons, and 56 Gy in 35 fractions to intermediate risk nodal echelons.   08/27/2013 - 08/30/2013 Hospital Admission   The patient was admitted to the hospital for uncontrolled nausea vomiting and dehydration.   02/14/2014 Imaging   PET/CT scan showed complete response to treatment   03/19/2014 Surgery   He had excisional lymph node biopsy from the right neck. Pathology was benign   05/13/2014 Surgery   He had removal of Port-A-Cath.   05/22/2014 Imaging   Repeat CT scan of the neck show no evidence of disease recurrence.   12/09/2014 Imaging   Ct neck without contrast showed persistent  abnormalities on the right side of neck, indeterminate   12/25/2014 Imaging   PET CT scan showed disease recurrence.   02/10/2015 Procedure   He has placement of port   02/13/2015 - 07/13/2015 Chemotherapy   He received  chemotherapy with carbo/Taxol   04/21/2015 Imaging   PET CT scan showed improved disease control   08/04/2015 Imaging   PET scan showed persistent disease   08/17/2015 -  Chemotherapy   He was started with [facility]   10/19/2015 Imaging   Ct neck showed mass-like intermediate density soft tissue at the right lateral neck recurrence site stable.    02/26/2016 Imaging   CT neck showed unchanged appearance of right neck recurrence compared to 10/19/2015 CT. No noncontrast evidence of new metastatic disease in the neck. Chronic sinusitis, progressed.   02/26/2016 Imaging   Diffuse but patchy and asymmetric partial airspace filling process (ground-glass opacity) in the lungs. This could be due to respiratory bronchiolitis, hypersensitivity pneumonitis or possible drug reaction. Atypical/viral pneumonia is also possible. Pulmonary consultation may be a helpful. A three-month follow-up noncontrast chest CT is suggested. Slight interval enlargement of mediastinal lymph nodes and a small lymph node along the left major fissure. This is most likely due to the inflammatory process in the lungs. No findings for metastatic disease involving the chest. No findings for upper abdominal metastatic disease.   02/29/2016 Adverse Reaction   His treatment is placed on hold due to possible hypersensitivity pneumonitis/drug reaction   04/14/2016 Imaging   Ct chest showed no evidence for metastatic disease within the chest. Significant interval improvement and near complete resolution of previously described diffuse bilateral predominately ground-glass pulmonary opacities, most compatible with resolving infectious/inflammatory process.   09/05/2016 Imaging   CT scan of neck and chest  1. Unchanged appearance of right neck recurrence. 2. No evidence of new metastatic disease in the neck. 3. Unremarkable and stable CT appearance of the chest. No findings suspicious for metastatic disease   07/24/2017 Imaging   1. No  findings suspicious for metastatic disease in the chest. 2. No acute consolidative airspace disease to suggest a pneumonia. 3. No appreciable change in chronic mild patchy upper lung predominant centrilobular micronodularity. If the patient is a current smoker, these findings are most compatible with smoking related interstitial lung disease. If the patient is not a current smoker, these findings suggest subacute hypersensitivity pneumonitis  or postinflammatory change. 4. Stable mild biapical radiation fibrosis. 5. Mild to moderate centrilobular emphysema with diffuse bronchial wall thickening, suggesting COPD. 6. One vessel coronary atherosclerosis.  Aortic Atherosclerosis (ICD10-I70.0) and Emphysema (ICD10-J43.9).   09/15/2017 Imaging   1. No evidence of interstitial lung disease. 2. Emphysema (ICD10-J43.9).   10/29/2017 Imaging   1. Moderate quality exam for pulmonary embolism with primary limitation of motion. No embolism identified. 2. Findings of congestive heart failure, including bilateral pleural effusions and septal thickening. 3. New thoracic adenopathy since approximately 6 weeks ago. Favor secondary to fluid overload/congestive heart failure. 4. New left apical 4 mm pulmonary nodule. Favored to represent a subpleural lymph node. Non-contrast chest CT can be considered in 12 months, given risk factors for primary bronchogenic carcinoma. This recommendation follows the consensus statement: Guidelines for Management of Incidental Pulmonary Nodules Detected on CT Images: From the Fleischner Society 2017; Radiology 2017; 254:270-623.    10/29/2017 - 11/02/2017 Hospital Admission   He was admitted to the hospital due to shortness of breath and was found to have congestive heart failure.   10/30/2017 Imaging  Definity used; severe global reduction in LV systolic function; severe LVE; restrictive filling; mild MR; mild LAE; mild RVE with moderate RV dysfunction; mild TR with moderate  pulmonary hypertension.   01/26/2018 Imaging   1. Regression of soft tissue in the postoperative right neck favoring scarring. No new or progressive finding to suggest recurrent disease. 2. Chest CT reported separately   01/26/2018 Imaging   1. No evidence of thoracic metastasis. 2. Port in the anterior chest wall with tip in distal    03/12/2018 Imaging   1. Trace, silent aspiration noted after a swallow consistent with aspiration of residual barium in the hypopharynx. 2. Retrograde reflux of contrast into the posterior nasopharynx with swallowing. 3. No gross esophageal abnormality 4. Initial difficulty swallowing a 13 mm barium tablet that than passes readily into the stomach once it is swallowed.   05/31/2018 Imaging   Resolution of previously seen mucosal hyperenhancement in the pharynx and larynx. Otherwise unchanged examination of the neck without evidence of recurrent disease or cervical nodal metastases.    06/07/2018 Echocardiogram   1. The left ventricle has moderately reduced systolic function, with an ejection fraction of 35-40%. The cavity size was severely dilated. Left ventricular diastolic Doppler parameters are consistent with impaired relaxation Left ventricular diffuse hypokinesis.  2. The right ventricle has normal systolic function. The cavity was moderately enlarged. There is no increase in right ventricular wall thickness.  3. The mitral valve is normal in structure.  4. The tricuspid valve is normal in structure.  5. The aortic valve is normal in structure.  6. The pulmonic valve was normal in structure.  7. The inferior vena cava was dilated in size with >50% respiratory variability.     REVIEW OF SYSTEMS:   Constitutional: Denies fevers, chills or abnormal weight loss Eyes: Denies blurriness of vision Ears, nose, mouth, throat, and face: Denies mucositis or sore throat Respiratory: Denies cough, dyspnea or wheezes Cardiovascular: Denies palpitation,  chest discomfort or lower extremity swelling Gastrointestinal:  Denies nausea, heartburn or change in bowel habits Skin: Denies abnormal skin rashes Lymphatics: Denies new lymphadenopathy or easy bruising Neurological:Denies numbness, tingling or new weaknesses Behavioral/Psych: Mood is stable, no new changes  All other systems were reviewed with the patient and are negative.  I have reviewed the past medical history, past surgical history, social history and family history with the patient and they are unchanged from previous note.  ALLERGIES:  is allergic to bee pollen and pollen extract.  MEDICATIONS:  Current Outpatient Medications  Medication Sig Dispense Refill  . albuterol (PROVENTIL HFA;VENTOLIN HFA) 108 (90 Base) MCG/ACT inhaler Inhale 2 puffs into the lungs every 6 (six) hours as needed for wheezing or shortness of breath. 1 Inhaler 6  . ALPRAZolam (XANAX) 0.5 MG tablet Take 1 tablet (0.5 mg total) by mouth 2 (two) times daily as needed for anxiety. 60 tablet 0  . aspirin EC 81 MG tablet Take 81 mg by mouth daily.    . carvedilol (COREG) 3.125 MG tablet TAKE 1 TABLET BY MOUTH TWICE DAILY WITH A MEAL. 60 tablet 7  . fluticasone (FLONASE) 50 MCG/ACT nasal spray Place 2 sprays into both nostrils daily. 16 g 2  . levothyroxine (SYNTHROID) 25 MCG tablet TAKE 1 TABLET BY MOUTH DAILY BEFORE BREAKFAST. 90 tablet 1  . lidocaine-prilocaine (EMLA) cream Apply 1 application topically as needed. 30 g 6  . oxyCODONE (ROXICODONE) 15 MG immediate release tablet Take 1 tablet (15 mg total) by mouth every 6 (six) hours  as needed. 90 tablet 0   No current facility-administered medications for this visit.    Facility-Administered Medications Ordered in Other Visits  Medication Dose Route Frequency Provider Last Rate Last Dose  . sodium chloride 0.9 % injection 10 mL  10 mL Intravenous PRN Alvy Bimler, Karmel Patricelli, MD   10 mL at 11/29/16 0818  . sodium chloride 0.9 % injection 10 mL  10 mL Intravenous PRN  Alvy Bimler, Carleah Yablonski, MD        PHYSICAL EXAMINATION: ECOG PERFORMANCE STATUS: 0 - Asymptomatic  Vitals:   10/10/18 1021  BP: 138/89  Pulse: 76  Resp: 17  Temp: 98.6 F (37 C)  SpO2: 100%   Filed Weights   10/10/18 1021  Weight: 242 lb 3.2 oz (109.9 kg)    GENERAL:alert, no distress and comfortable SKIN: skin color, texture, turgor are normal, no rashes or significant lesions EYES: normal, Conjunctiva are pink and non-injected, sclera clear OROPHARYNX:no exudate, no erythema and lips, buccal mucosa, and tongue normal  NECK: Well-healed surgical scar  lYMPH:  no palpable lymphadenopathy in the cervical, axillary or inguinal LUNGS: clear to auscultation and percussion with normal breathing effort HEART: regular rate & rhythm and no murmurs and no lower extremity edema ABDOMEN:abdomen soft, non-tender and normal bowel sounds Musculoskeletal:no cyanosis of digits and no clubbing  NEURO: alert & oriented x 3 with fluent speech, no focal motor/sensory deficits  LABORATORY DATA:  I have reviewed the data as listed    Component Value Date/Time   NA 140 10/10/2018 1004   NA 141 04/20/2017 1028   K 4.2 10/10/2018 1004   K 4.3 04/20/2017 1028   CL 104 10/10/2018 1004   CO2 26 10/10/2018 1004   CO2 26 04/20/2017 1028   GLUCOSE 83 10/10/2018 1004   GLUCOSE 96 04/20/2017 1028   BUN 15 10/10/2018 1004   BUN 16.7 04/20/2017 1028   CREATININE 1.73 (H) 10/10/2018 1004   CREATININE 2.0 (H) 04/20/2017 1028   CALCIUM 8.8 (L) 10/10/2018 1004   CALCIUM 9.0 04/20/2017 1028   PROT 7.2 10/10/2018 1004   PROT 7.0 04/20/2017 1028   ALBUMIN 3.9 10/10/2018 1004   ALBUMIN 4.0 04/20/2017 1028   AST 24 10/10/2018 1004   AST 20 04/20/2017 1028   ALT 16 10/10/2018 1004   ALT 17 04/20/2017 1028   ALKPHOS 66 10/10/2018 1004   ALKPHOS 61 04/20/2017 1028   BILITOT 0.6 10/10/2018 1004   BILITOT 0.49 04/20/2017 1028   GFRNONAA 45 (L) 10/10/2018 1004   GFRAA 52 (L) 10/10/2018 1004    No results found  for: SPEP, UPEP  Lab Results  Component Value Date   WBC 8.9 10/10/2018   NEUTROABS 7.2 10/10/2018   HGB 14.6 10/10/2018   HCT 46.3 10/10/2018   MCV 94.1 10/10/2018   PLT 185 10/10/2018      Chemistry      Component Value Date/Time   NA 140 10/10/2018 1004   NA 141 04/20/2017 1028   K 4.2 10/10/2018 1004   K 4.3 04/20/2017 1028   CL 104 10/10/2018 1004   CO2 26 10/10/2018 1004   CO2 26 04/20/2017 1028   BUN 15 10/10/2018 1004   BUN 16.7 04/20/2017 1028   CREATININE 1.73 (H) 10/10/2018 1004   CREATININE 2.0 (H) 04/20/2017 1028      Component Value Date/Time   CALCIUM 8.8 (L) 10/10/2018 1004   CALCIUM 9.0 04/20/2017 1028   ALKPHOS 66 10/10/2018 1004   ALKPHOS 61 04/20/2017 1028   AST 24  10/10/2018 1004   AST 20 04/20/2017 1028   ALT 16 10/10/2018 1004   ALT 17 04/20/2017 1028   BILITOT 0.6 10/10/2018 1004   BILITOT 0.49 04/20/2017 1028       All questions were answered. The patient knows to call the clinic with any problems, questions or concerns. No barriers to learning was detected.  I spent 15 minutes counseling the patient face to face. The total time spent in the appointment was 20 minutes and more than 50% was on counseling and review of test results  [name], MD 10/10/2018 11:25 AM

## 2018-10-10 NOTE — Telephone Encounter (Signed)
I can see him today at 1030 or Thursday at 12 I think he might need port flush

## 2018-10-10 NOTE — Assessment & Plan Note (Signed)
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 Clinically, he does not have signs or symptoms of congestive heart failure He will continue medical management and follow-up with cardiologist

## 2018-10-10 NOTE — Telephone Encounter (Signed)
Called and given below message. He verbalized understanding. Scheduling message sent to schedule for today.

## 2018-10-10 NOTE — Assessment & Plan Note (Signed)
He has acquired hypothyroidism secondary to treatment We will continue to monitor his TSH and adjust medication prn 

## 2018-10-10 NOTE — Assessment & Plan Note (Signed)
He has stable chronic kidney disease since chemotherapy Observe closely 

## 2018-10-11 ENCOUNTER — Telehealth: Payer: Self-pay | Admitting: Hematology and Oncology

## 2018-10-11 NOTE — Telephone Encounter (Signed)
I talk with patient regarding schedule  

## 2018-10-15 ENCOUNTER — Ambulatory Visit: Payer: Medicaid Other | Admitting: Hematology and Oncology

## 2018-10-15 ENCOUNTER — Other Ambulatory Visit: Payer: Medicaid Other

## 2018-10-26 ENCOUNTER — Telehealth: Payer: Self-pay | Admitting: *Deleted

## 2018-10-26 MED ORDER — HYDRALAZINE HCL 25 MG PO TABS: 25.0000 mg | ORAL_TABLET | Freq: Three times a day (TID) | ORAL | 3 refills | Status: DC

## 2018-10-26 MED ORDER — ISOSORBIDE MONONITRATE ER 30 MG PO TB24: 30.0000 mg | ORAL_TABLET | Freq: Every day | ORAL | 3 refills | Status: DC

## 2018-10-26 MED FILL — ISOSORBIDE MN ER 30 MG TAB: 30 | 90 days supply | Qty: 90 | Fill #0

## 2018-10-26 MED FILL — hydrALAZINE HCL 25 MG TABS: 25 | 90 days supply | Qty: 270 | Fill #0

## 2018-10-26 NOTE — Telephone Encounter (Signed)
-----   Message from Fay Records, MD sent at 10/15/2018  3:44 PM EDT ----- Mason [name] to call pt  Left VM Pumping function of the heart appears down some from previous echo    Now in 25 to30% range    His BP was a little bit up     He is not on Hydralazine or NTG   Would recomm Hydralazine 25 tid and imdur 30 mg SHould have in clinic visiti when I am in again to listen to, check BP/fluid status WIll probably repeat echo later in fall (limite)

## 2018-10-26 NOTE — Telephone Encounter (Signed)
Spoke with pt and reviewed results/recommendations from Dr. Harrington Challenger.  Will send prescriptions for hydralazine 25 mg and isosorbide 30 mg to [facility]. Appointment for follow up scheduled 12/03/18.

## 2018-10-29 MED FILL — CARVEDILOL 3.125 MG TABLET: 3.125 | 30 days supply | Qty: 60 | Fill #5

## 2018-11-06 ENCOUNTER — Other Ambulatory Visit: Payer: Self-pay | Admitting: Hematology and Oncology

## 2018-11-06 DIAGNOSIS — C099 Malignant neoplasm of tonsil, unspecified: Secondary | ICD-10-CM

## 2018-11-06 MED ORDER — OXYCODONE HCL 15 MG PO TABS: 15.0000 mg | ORAL_TABLET | Freq: Four times a day (QID) | ORAL | 0 refills | Status: DC | PRN

## 2018-11-06 MED FILL — oxyCODONE HCL 15 MG TABS: 15 | 23 days supply | Qty: 90 | Fill #0

## 2018-11-14 MED FILL — LEVOTHYROXINE 25 MCG TABLET: 25 | 90 days supply | Qty: 90 | Fill #1

## 2018-11-23 ENCOUNTER — Emergency Department (HOSPITAL_COMMUNITY): Payer: Medicaid Other

## 2018-11-23 ENCOUNTER — Emergency Department (HOSPITAL_COMMUNITY)
Admission: EM | Admit: 2018-11-23 | Discharge: 2018-11-23 | Disposition: A | Discharge status: Home / Self Care | Payer: Medicaid Other | Attending: Emergency Medicine | Admitting: Emergency Medicine

## 2018-11-23 ENCOUNTER — Other Ambulatory Visit: Payer: Self-pay

## 2018-11-23 ENCOUNTER — Encounter (HOSPITAL_COMMUNITY): Payer: Self-pay

## 2018-11-23 DIAGNOSIS — Z8501 Personal history of malignant neoplasm of esophagus: Secondary | ICD-10-CM | POA: Insufficient documentation

## 2018-11-23 DIAGNOSIS — F1210 Cannabis abuse, uncomplicated: Secondary | ICD-10-CM | POA: Diagnosis not present

## 2018-11-23 DIAGNOSIS — N182 Chronic kidney disease, stage 2 (mild): Secondary | ICD-10-CM | POA: Insufficient documentation

## 2018-11-23 DIAGNOSIS — I509 Heart failure, unspecified: Secondary | ICD-10-CM | POA: Insufficient documentation

## 2018-11-23 DIAGNOSIS — Z9221 Personal history of antineoplastic chemotherapy: Secondary | ICD-10-CM | POA: Insufficient documentation

## 2018-11-23 DIAGNOSIS — R1310 Dysphagia, unspecified: Secondary | ICD-10-CM | POA: Diagnosis present

## 2018-11-23 DIAGNOSIS — Z923 Personal history of irradiation: Secondary | ICD-10-CM | POA: Diagnosis not present

## 2018-11-23 DIAGNOSIS — Z87891 Personal history of nicotine dependence: Secondary | ICD-10-CM | POA: Insufficient documentation

## 2018-11-23 DIAGNOSIS — F1310 Sedative, hypnotic or anxiolytic abuse, uncomplicated: Secondary | ICD-10-CM | POA: Insufficient documentation

## 2018-11-23 DIAGNOSIS — I130 Hypertensive heart and chronic kidney disease with heart failure and stage 1 through stage 4 chronic kidney disease, or unspecified chronic kidney disease: Secondary | ICD-10-CM | POA: Insufficient documentation

## 2018-11-23 LAB — CBC WITH DIFFERENTIAL/PLATELET
Abs Immature Granulocytes: 0.03 10*3/uL (ref 0.00–0.07)
Basophils Absolute: 0 10*3/uL (ref 0.0–0.1)
Basophils Relative: 0 %
Eosinophils Absolute: 0.1 10*3/uL (ref 0.0–0.5)
Eosinophils Relative: 2 %
HCT: 44.4 % (ref 39.0–52.0)
Hemoglobin: 14.2 g/dL (ref 13.0–17.0)
Immature Granulocytes: 0 %
Lymphocytes Relative: 9 %
Lymphs Abs: 0.8 10*3/uL (ref 0.7–4.0)
MCH: 31.6 pg (ref 26.0–34.0)
MCHC: 32 g/dL (ref 30.0–36.0)
MCV: 98.7 fL (ref 80.0–100.0)
Monocytes Absolute: 0.7 10*3/uL (ref 0.1–1.0)
Monocytes Relative: 9 %
Neutro Abs: 6.6 10*3/uL (ref 1.7–7.7)
Neutrophils Relative %: 80 %
Platelets: 171 10*3/uL (ref 150–400)
RBC: 4.5 MIL/uL (ref 4.22–5.81)
RDW: 14.9 % (ref 11.5–15.5)
WBC: 8.3 10*3/uL (ref 4.0–10.5)
nRBC: 0 % (ref 0.0–0.2)

## 2018-11-23 LAB — LIPASE, BLOOD: Lipase: 24 U/L (ref 11–51)

## 2018-11-23 LAB — COMPREHENSIVE METABOLIC PANEL
ALT: 23 U/L (ref 0–44)
AST: 31 U/L (ref 15–41)
Albumin: 4 g/dL (ref 3.5–5.0)
Alkaline Phosphatase: 54 U/L (ref 38–126)
Anion gap: 10 (ref 5–15)
BUN: 17 mg/dL (ref 6–20)
CO2: 26 mmol/L (ref 22–32)
Calcium: 9 mg/dL (ref 8.9–10.3)
Chloride: 104 mmol/L (ref 98–111)
Creatinine, Ser: 1.61 mg/dL — ABNORMAL HIGH (ref 0.61–1.24)
GFR calc Af Amer: 57 mL/min — ABNORMAL LOW (ref >60)
GFR calc non Af Amer: 49 mL/min — ABNORMAL LOW (ref >60)
Glucose, Bld: 94 mg/dL (ref 70–99)
Potassium: 4.1 mmol/L (ref 3.5–5.1)
Sodium: 140 mmol/L (ref 135–145)
Total Bilirubin: 0.7 mg/dL (ref 0.3–1.2)
Total Protein: 7.3 g/dL (ref 6.5–8.1)

## 2018-11-23 LAB — TROPONIN I (HIGH SENSITIVITY)
Troponin I (High Sensitivity): 33 ng/L — ABNORMAL HIGH (ref <18)
Troponin I (High Sensitivity): 34 ng/L — ABNORMAL HIGH (ref <18)

## 2018-11-23 MED ORDER — GLUCAGON HCL RDNA (DIAGNOSTIC) 1 MG IJ SOLR
1.0000 mg | Freq: Once | INTRAMUSCULAR | Status: AC
  Administered 2018-11-23: 06:00:00 1 mg via INTRAVENOUS
  Filled 2018-11-23: qty 1

## 2018-11-23 MED ORDER — ONDANSETRON HCL 4 MG/2ML IJ SOLN
4.0000 mg | Freq: Once | INTRAMUSCULAR | Status: AC
  Administered 2018-11-23: 06:00:00 4 mg via INTRAVENOUS
  Filled 2018-11-23: qty 2

## 2018-11-23 MED ORDER — SODIUM CHLORIDE (PF) 0.9 % IJ SOLN
INTRAMUSCULAR | Status: AC
  Filled 2018-11-23: qty 50

## 2018-11-23 MED ORDER — HEPARIN SOD (PORK) LOCK FLUSH 100 UNIT/ML IV SOLN
500.0000 [IU] | Freq: Once | INTRAVENOUS | Status: AC
  Administered 2018-11-23: 500 [IU]
  Filled 2018-11-23: qty 5

## 2018-11-23 MED ORDER — NITROGLYCERIN 0.4 MG SL SUBL
0.4000 mg | SUBLINGUAL_TABLET | Freq: Once | SUBLINGUAL | Status: AC
  Administered 2018-11-23: 0.4 mg via SUBLINGUAL
  Filled 2018-11-23: qty 1

## 2018-11-23 MED ORDER — IOHEXOL 300 MG/ML  SOLN
75.0000 mL | Freq: Once | INTRAMUSCULAR | Status: AC | PRN
  Administered 2018-11-23: 08:00:00 75 mL via INTRAVENOUS

## 2018-11-23 MED ORDER — LORAZEPAM 2 MG/ML IJ SOLN
1.0000 mg | Freq: Once | INTRAMUSCULAR | Status: AC
  Administered 2018-11-23: 1 mg via INTRAVENOUS
  Filled 2018-11-23: qty 1

## 2018-11-23 NOTE — Discharge Instructions (Addendum)
You need to make an appointment with gastroenterology. I think you made need an endoscopy and possibly an esophageal dilation.

## 2018-11-23 NOTE — ED Notes (Signed)
Pt given water and able to tolerate a few sips

## 2018-11-23 NOTE — ED Provider Notes (Signed)
Emergency Department Provider Note   I have reviewed the triage vital signs and the nursing notes.   HISTORY  Chief Complaint Dysphagia   HPI Mason [name] is a 53 y.o. male with a history of esophageal cancer s/p resection, chemo and radiation who presents with progressively worsening dysphagia in the mornings however this morning it hasn't gotten better like it ususally does. Sounds like he has had an esophageal dilation before. Did not eat anything prior to this happening but is not tolerating even secretions at this point.    No other associated or modifying symptoms.    Past Medical History:  Diagnosis Date  . Abnormal liver function test   . Acute sinusitis, unspecified 05/18/2015  . Anemia   . Anxiety    mild new dx  . Arthritis    knees,hips  . Bilateral edema of lower extremity   . Complication of anesthesia    Pt stated " my oxygen level was slow in rising."  . Concussion    Hx: in high school x 2  . Constipation   . Dysuria 09/04/2015  . Fever   . Hyperactive gag reflex   . Hypertension   . Hypoglycemia   . Hypokalemia   . Insomnia 06/15/2015  . Knee pain, chronic   . Malnutrition ([place])   . Non-healing surgical wound 05/23/2014  . PEG (percutaneous endoscopic gastrostomy) status ([place])   . Renal failure, acute ([place])   . S/P radiation therapy 08/19/2013-10/15/2013   Right Tonstil and bilateral neck / 70 Gy in 35 fractions to gross disease, 63 Gy in 35 fractions to high risk nodal echelons, and 56 Gy in 35 fractions to intermediate risk nodal echelons  . Severe nausea and vomiting   . Status post chemotherapy    Only received 2 doses due to uncontrolled nausea and acute renal failure  . 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 Port-A-Cath in place 05/28/2018  . Anxiety disorder due to medical condition 03/26/2018  . Goals of care, counseling/discussion 01/29/2018  .  Insomnia disorder 01/29/2018  . Candidiasis of mouth and esophagus ([place]) 01/17/2018  . Drug-induced skin rash 01/17/2018  . Yeast infection 01/16/2018  . CHF (congestive heart failure) ([place]) 10/30/2017  . Elevated LFTs   . Restrictive lung disease 10/09/2017  . Emphysema of lung ([place]) 09/18/2017  . Dyspnea and respiratory abnormalities 07/13/2017  . 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 Acquired hypothyroidism 09/06/2016  . Encounter for antineoplastic chemotherapy 08/09/2016  . Debility 07/15/2016  . Cough 06/17/2016  . Nasal congestion 06/17/2016  . Elevated BP without diagnosis of hypertension 06/17/2016  . Poor dentition 05/20/2016  . Drug-induced pneumonitis 02/29/2016  . Sinus congestion 02/08/2016  . Weight loss 01/18/2016  . 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 Dysuria 09/04/2015  . Palliative care by specialist 08/05/2015  . Insomnia 06/15/2015  . Acute gout involving toe of right foot 05/25/2015  . Acute sinusitis, unspecified 05/18/2015  . Pancytopenia due to antineoplastic chemotherapy ([place]) 04/27/2015  . Primary cancer of tonsillar fossa ([place]) 03/20/2015  . Cancer associated pain 02/12/2015  . Lung nodule, multiple 12/30/2014  . 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  . Chronic kidney disease (CKD), stage II (mild) 12/02/2014  . Abdominal wall pain in left upper quadrant 09/30/2014  . Acquired dysphasia 07/30/2014  . Neuropathy due to chemotherapeutic drug ([place])  07/30/2014  . Lymphedema of face 06/27/2014  . Dysphagia, oropharyngeal 06/27/2014  . Neck stiffness 06/27/2014  . Throat pain in adult 02/19/2014  . Anemia in neoplastic disease 10/14/2013  . Leukopenia due to antineoplastic chemotherapy ([place]) 10/14/2013  . Renal insufficiency 10/14/2013  . Nausea & vomiting 09/12/2013  . Chronic periodontitis 08/01/2013  . Tonsillar cancer ([place]) 07/16/2013    Past Surgical History:  Procedure Laterality Date  . LAPAROSCOPIC  GASTROSTOMY N/A 08/15/2013   Procedure: LAPAROSCOPIC GASTROSTOMY TUBE PLACEMENT;  Surgeon: [name], MD;  Location: [facility];  Service: General;  Laterality: N/A;  . LYMPH NODE BIOPSY  03/20/14   right neck  . MULTIPLE EXTRACTIONS WITH ALVEOLOPLASTY N/A 08/01/2013   Procedure: Extraction of tooth #'s 1,15,17,31, 32 with alveoloplasty, mandibular left torus reduction, and gross debridement of remaining teeth.;  Surgeon: [name], DDS;  Location: [facility];  Service: Oral Surgery;  Laterality: N/A;  . PORT-A-CATH REMOVAL N/A 05/13/2014   Procedure: REMOVAL of PORT-A-CATH;  Surgeon: [name], MD;  Location: [facility];  Service: General;  Laterality: N/A;  . PORTACATH PLACEMENT Left 08/15/2013   Procedure: INSERTION PORT-A-CATH;  Surgeon: [name], MD;  Location: [facility];  Service: General;  Laterality: Left;    Current Outpatient Rx  . Order #: [id number] Class: Normal  . Order #: [id number] Class: Normal  . Order #: [id number] Class: Historical Med  . Order #: [id number] Class: Normal  . Order #: [id number] Class: Normal  . Order #: [id number] Class: Normal  . Order #: [id number] Class: Normal  . Order #: [id number] Class: Normal  . Order #: [id number] Class: Normal  . Order #: [id number] Class: Normal    Allergies Bee pollen and Pollen extract  Family History  Problem Relation Age of Onset  . Arthritis Mother   . Heart disease Mother   . Arthritis Father   . CVA Unknown   . Diabetes Unknown   . Heart attack Unknown   . Heart disease Maternal Grandmother   . CVA Maternal Grandmother     Social History Social History   Tobacco Use  . Smoking status: Former Smoker    Packs/day: 1.00    Years: 20.00    Pack years: 20.00    Types: Cigarettes    Quit date: 07/16/2003    Years since quitting: 15.3  . Smokeless tobacco: Never Used  Substance Use Topics  . Alcohol use: No    Comment: none years ago  . Drug use: Yes    Types: Marijuana, Benzodiazepines    Comment: years ago     Review of Systems  All other systems negative except as documented in the HPI. All pertinent positives and negatives as reviewed in the HPI. ____________________________________________   PHYSICAL EXAM:  VITAL SIGNS: ED Triage Vitals  Enc Vitals Group     BP 11/23/18 0453 (!) 140/107     Pulse Rate 11/23/18 0453 (!) 101     Resp 11/23/18 0453 18     Temp 11/23/18 0453 98.3 F (36.8 C)     Temp Source 11/23/18 0453 Oral     SpO2 11/23/18 0453 99 %     Weight 11/23/18 0500 244 lb (110.7 kg)     Height 11/23/18 0500 6\' 5"  (1.956 m)     Head Circumference --      Peak Flow --      Pain Score --      Pain Loc --      Pain Edu? --      Excl. in [place]? --  Constitutional: Alert and oriented. Well appearing and in no acute distress. Eyes: Conjunctivae are normal. PERRL. EOMI. Head: Atraumatic. Nose: No congestion/rhinnorhea. Mouth/Throat: Mucous membranes are moist.  Oropharynx non-erythematous. Neck: No stridor.  No meningeal signs.   Cardiovascular: Normal rate, regular rhythm. Good peripheral circulation. Grossly normal heart sounds.   Respiratory: Normal respiratory effort.  No retractions. Lungs CTAB. Gastrointestinal: Soft and nontender. No distention.  Musculoskeletal: No lower extremity tenderness nor edema. No gross deformities of extremities. Neurologic:  Normal speech and language. No gross focal neurologic deficits are appreciated.  Skin:  Skin is warm, dry and intact. No rash noted.   ____________________________________________   LABS (all labs ordered are listed, but only abnormal results are displayed)  Labs Reviewed  COMPREHENSIVE METABOLIC PANEL - Abnormal; Notable for the following components:      Result Value   Creatinine, Ser 1.61 (*)    GFR calc non Af Amer 49 (*)    GFR calc Af Amer 57 (*)    All other components within normal limits  TROPONIN I (HIGH SENSITIVITY) - Abnormal; Notable for the following components:   Troponin I (High  Sensitivity) 33 (*)    All other components within normal limits  TROPONIN I (HIGH SENSITIVITY) - Abnormal; Notable for the following components:   Troponin I (High Sensitivity) 34 (*)    All other components within normal limits  CBC WITH DIFFERENTIAL/PLATELET  LIPASE, BLOOD   ____________________________________________  EKG   EKG Interpretation  Date/Time:  Friday November 23 2018 05:42:53 EDT Ventricular Rate:  95 PR Interval:    QRS Duration: 104 QT Interval:  408 QTC Calculation: 513 R Axis:   7 Text Interpretation:  Sinus rhythm Biatrial enlargement Abnormal T, consider ischemia, diffuse leads Prolonged QT interval worsened TWI in multiple leads compared to 10/2017 Confirmed by Merrily Pew [phone]) on 11/23/2018 5:47:07 AM Also confirmed by Merrily Pew [phone]), editor Philomena Doheny [phone])  on 11/23/2018 6:57:48 AM       ____________________________________________  RADIOLOGY  Ct Chest W Contrast  Result Date: 11/23/2018 CLINICAL DATA:  Unexplained dysphagia, woke up with difficulty swallowing, history hypertension, tonsillar cancer in 2015 with recurrence in 2016, prior radiation and chemotherapy, history hypertension, former smoker EXAM: CT CHEST WITH CONTRAST TECHNIQUE: Multidetector CT imaging of the chest was performed during intravenous contrast administration. Sagittal and coronal MPR images reconstructed from axial data set. CONTRAST:  42mL OMNIPAQUE IOHEXOL 300 MG/ML  SOLN IV. COMPARISON:  CT chest 01/26/2018 FINDINGS: Cardiovascular: RIGHT jugular Port-A-Cath with tip at cavoatrial junction. Vascular structures patent on nondedicated exam. Heart upper normal size. No pericardial effusion. Mediastinum/Nodes: Base of cervical region normal appearance. Few normal size mediastinal lymph nodes. No thoracic adenopathy. Esophagus unremarkable. Lungs/Pleura: Minimal biapical scarring, stable. No pulmonary infiltrate, pleural effusion or pneumothorax. No definite pulmonary  mass/nodule. Upper Abdomen: Visualized upper abdomen unremarkable Musculoskeletal: Degenerative disc disease changes thoracic spine. No acute osseous lesions. IMPRESSION: No acute or significant intrathoracic abnormalities. Electronically Signed   By: Lavonia Dana M.D.   On: 11/23/2018 08:27    ____________________________________________   PROCEDURES  Procedure(s) performed:   Procedures   ____________________________________________   INITIAL IMPRESSION / ASSESSMENT AND PLAN / ED COURSE  Doesn't seem like a standard esophageal food impaction but will try cocktail just in case. Likely recurrent stricture/ring or cancer. Will ct/labs/fluids as well.   Care transferred pending ct and reeval. Patient seems improved after meds here. If ct normal can likely be dischaged to follow up with gi.  Pertinent labs & imaging results that were available during my care of the patient were reviewed by me and considered in my medical decision making (see chart for details).  ____________________________________________  FINAL CLINICAL IMPRESSION(S) / ED DIAGNOSES  Final diagnoses:  Dysphagia, unspecified type     MEDICATIONS GIVEN DURING THIS VISIT:  Medications  ondansetron (ZOFRAN) injection 4 mg (4 mg Intravenous Given 11/23/18 0620)  glucagon (human recombinant) (GLUCAGEN) injection 1 mg (1 mg Intravenous Given 11/23/18 0627)  LORazepam (ATIVAN) injection 1 mg (1 mg Intravenous Given 11/23/18 8264)  nitroGLYCERIN (NITROSTAT) SL tablet 0.4 mg (0.4 mg Sublingual Given 11/23/18 0629)  iohexol (OMNIPAQUE) 300 MG/ML solution 75 mL (75 mLs Intravenous Contrast Given 11/23/18 0748)  heparin lock flush 100 unit/mL (500 Units Intracatheter Given 11/23/18 1028)     NEW OUTPATIENT MEDICATIONS STARTED DURING THIS VISIT:  Discharge Medication List as of 11/23/2018  9:58 AM      Note:  This note was prepared with assistance of Dragon voice recognition software. Occasional wrong-word or sound-a-like  substitutions may have occurred due to the inherent limitations of voice recognition software.   Tahjir Silveria, Corene Cornea, MD 11/24/18 [phone]

## 2018-11-23 NOTE — ED Notes (Signed)
Pt ambulatory to restroom without assistance 

## 2018-11-23 NOTE — ED Notes (Signed)
Pt dressed into a gown and placed on cardiac monitoring. Pt has call bell within reach. Pt vomiting and given emesis bag. Lovena Le, RN aware.

## 2018-11-23 NOTE — ED Triage Notes (Addendum)
Pt reports awoke this AM with difficulty swallowing. Hx of same when dx with throat CA.Ate shrimp last PM but haas eaten in pass w/o problem. Denies SOB and no stridor noted. Emesis x4 since onset

## 2018-11-23 NOTE — ED Notes (Signed)
Patient transported to CT 

## 2018-11-23 NOTE — ED Notes (Signed)
Pt given warm blankets.

## 2018-12-02 NOTE — Progress Notes (Signed)
Cardiology Office Note   Date:  12/03/2018   ID:  Mason [name], DOB 1965/10/15, MRN [id number]  PCP:  [name], FNP  Cardiologist:   [name], MD   F/U of  Chronic systolic CHF     History of Present Illness: Mason [name] is a 53 y.o. male with a history of NICM  Also a history of tonsillar CA   I saw him at [facility] in July 2019  LVEF was severely down on echo at 15%.  RVEF was also reduced.   Pt denied hx of CP  He was discharged from the hospital after diuresis on carvedilol, hydralazine and NTG    The patient did well initially but then retained fluid due to salt indiscretion.   He was seen in clinic by [name] in March 2020 for CP   L sided, waxing/waining   Worse with sex, driving, changes in position.   Echo in Feb 2020  LVEF 35to 40%   RV large but RVEF normal   The pt was seen in televist on 08/09/18  Doing well   Off of Hydralazine and NTG at that time    Echo ordered    Done in JUne  LVEF 25 to 30%   REcomm restarting hydralaizne and imdur  Plan for repeat limited echo in fall    SInce seen he says he feels OK  The only time he feels uncomfortable in chest  (pressure, some palpitatoins) is after intercourse  Not at other times   No CP   Breathing is OK   No edema He  Was seen in ED on 8/7 with complaints of dyshagia  CT done  NEgative         Current Meds  Medication Sig  . albuterol (PROVENTIL HFA;VENTOLIN HFA) 108 (90 Base) MCG/ACT inhaler Inhale 2 puffs into the lungs every 6 (six) hours as needed for wheezing or shortness of breath.  . ALPRAZolam (XANAX) 0.5 MG tablet Take 1 tablet (0.5 mg total) by mouth 2 (two) times daily as needed for anxiety.  Marland Kitchen aspirin EC 81 MG tablet Take 81 mg by mouth daily.  . carvedilol (COREG) 3.125 MG tablet TAKE 1 TABLET BY MOUTH TWICE DAILY WITH A MEAL. (Patient taking differently: Take 3.125 mg by mouth 2 (two) times daily with a meal. )  . fluticasone (FLONASE) 50 MCG/ACT nasal spray Place 2 sprays into both  nostrils daily.  . hydrALAZINE (APRESOLINE) 25 MG tablet Take 1 tablet (25 mg total) by mouth 3 (three) times daily.  . isosorbide mononitrate (IMDUR) 30 MG 24 hr tablet Take 1 tablet (30 mg total) by mouth daily.  Marland Kitchen levothyroxine (SYNTHROID) 25 MCG tablet TAKE 1 TABLET BY MOUTH DAILY BEFORE BREAKFAST. (Patient taking differently: Take 25 mcg by mouth daily before breakfast. )  . lidocaine-prilocaine (EMLA) cream Apply 1 application topically as needed. (Patient taking differently: Apply 1 application topically as needed (port). )  . oxyCODONE (ROXICODONE) 15 MG immediate release tablet Take 1 tablet (15 mg total) by mouth every 6 (six) hours as needed. (Patient taking differently: Take 15 mg by mouth every 6 (six) hours as needed for pain. )     Allergies:   Bee pollen and Pollen extract   Past Medical History:  Diagnosis Date  . Abnormal liver function test   . Acute sinusitis, unspecified 05/18/2015  . Anemia   . Anxiety    mild new dx  . Arthritis    knees,hips  .  Bilateral edema of lower extremity   . Complication of anesthesia    Pt stated " my oxygen level was slow in rising."  . Concussion    Hx: in high school x 2  . Constipation   . Dysuria 09/04/2015  . Fever   . Hyperactive gag reflex   . Hypertension   . Hypoglycemia   . Hypokalemia   . Insomnia 06/15/2015  . Knee pain, chronic   . Malnutrition ([place])   . Non-healing surgical wound 05/23/2014  . PEG (percutaneous endoscopic gastrostomy) status ([place])   . Renal failure, acute ([place])   . S/P radiation therapy 08/19/2013-10/15/2013   Right Tonstil and bilateral neck / 70 Gy in 35 fractions to gross disease, 63 Gy in 35 fractions to high risk nodal echelons, and 56 Gy in 35 fractions to intermediate risk nodal echelons  . Severe nausea and vomiting   . Status post chemotherapy    Only received 2 doses due to uncontrolled nausea and acute renal failure  . Tonsillar cancer ([place]) 07/09/13   SCCA of Right Tonsil, recurrent 2016     Past Surgical History:  Procedure Laterality Date  . LAPAROSCOPIC GASTROSTOMY N/A 08/15/2013   Procedure: LAPAROSCOPIC GASTROSTOMY TUBE PLACEMENT;  Surgeon: [name], MD;  Location: [place];  Service: General;  Laterality: N/A;  . LYMPH NODE BIOPSY  03/20/14   right neck  . MULTIPLE EXTRACTIONS WITH ALVEOLOPLASTY N/A 08/01/2013   Procedure: Extraction of tooth #'s 1,15,17,31, 32 with alveoloplasty, mandibular left torus reduction, and gross debridement of remaining teeth.;  Surgeon: [name], DDS;  Location: [place];  Service: Oral Surgery;  Laterality: N/A;  . PORT-A-CATH REMOVAL N/A 05/13/2014   Procedure: REMOVAL of PORT-A-CATH;  Surgeon: [name], MD;  Location: [place];  Service: General;  Laterality: N/A;  . PORTACATH PLACEMENT Left 08/15/2013   Procedure: INSERTION PORT-A-CATH;  Surgeon: [name], MD;  Location: [place];  Service: General;  Laterality: Left;     Social History:  The patient  reports that he quit smoking about 15 years ago. His smoking use included cigarettes. He has a 20.00 pack-year smoking history. He has never used smokeless tobacco. He reports current drug use. Drugs: Marijuana and Benzodiazepines. He reports that he does not drink alcohol.   Family History:  The patient's family history includes Arthritis in his father and mother; CVA in his maternal grandmother and another family member; Diabetes in an other family member; Heart attack in an other family member; Heart disease in his maternal grandmother and mother.    ROS:  Please see the history of present illness. All other systems are reviewed and  Negative to the above problem except as noted.    PHYSICAL EXAM: VS:  BP 118/60   Pulse 91   Ht 6\' 6"  (1.981 m)   Wt 241 lb 6.4 oz (109.5 kg)   SpO2 98%   BMI 27.90 kg/m   GEN: Well nourished, well developed, in no acute distress  HEENT: normal  Neck: no JVD, carotid bruits, or masses Cardiac: RRR; no murmurs, rubs, or gallops,no  edema  Respiratory:  clear to auscultation bilaterally, normal work of breathing GI: soft, nontender, nondistended, + BS  No hepatomegaly  MS: no deformity Moving all extremities   Skin: warm and dry, no rash Neuro:  Strength and sensation are intact Psych: euthymic mood, full affect   EKG:  EKG is not  ordered today.  Echo  10/10/18   1. The left ventricle has  severely reduced systolic function, with an ejection fraction of 20-25%. The cavity size was severely dilated. Left ventricular diastolic Doppler parameters are consistent with impaired relaxation. Left ventricular diffuse  hypokinesis.  2. The aortic valve is tricuspid. No stenosis of the aortic valve.  3. Severe global reduction in LV systolic function; mild diastolic dysfunction; severe LVE.  FINDINGS  Left Ventricle: The left ventricle has severely reduced systolic function, with an ejection fraction of 20-25%. The cavity size was severely dilated. There is no increase in left ventricular wall thickness. Left ventricular diastolic Doppler parameters  are consistent with impaired relaxation. Left ventricular diffuse hypokinesis.  Right Ventricle: The right ventricle has normal systolic function. The cavity was normal.  Left Atrium: Left atrial size was normal in size.  Right Atrium: Right atrial size was normal in size. Right atrial pressure is estimated at 10 mmHg.  Interatrial Septum: No atrial level shunt detected by color flow Doppler.  Pericardium: There is no evidence of pericardial effusion.  Mitral Valve: The mitral valve is normal in structure. Mitral valve regurgitation is not visualized by color flow Doppler.  Tricuspid Valve: The tricuspid valve is normal in structure. Tricuspid valve regurgitation is trivial by color flow Doppler.  Aortic Valve: The aortic valve is tricuspid Aortic valve regurgitation was not visualized by color flow Doppler. There is No stenosis of the aortic valve.  Pulmonic  Valve: The pulmonic valve was not well visualized. Pulmonic valve regurgitation is not visualized by color flow Doppler.  Venous: The inferior vena cava is normal in size with greater than 50% respiratory variability.  Additional Comments: Severe global reduction in LV systolic function; mild diastolic dysfunction; severe LVE.    +--------------+--------++ LEFT VENTRICLE         +----------------+---------++ +--------------+--------++ Diastology                PLAX 2D                +----------------+---------++ +--------------+--------++ LV e' lateral:  8.05 cm/s LVIDd:        6.60 cm  +----------------+---------++ +--------------+--------++ LV E/e' lateral:9.3       LVIDs:        5.70 cm  +----------------+---------++ +--------------+--------++ LV e' medial:   5.87 cm/s LV PW:        1.10 cm  +----------------+---------++ +--------------+--------++ LV E/e' medial: 12.7      LV IVS:       1.10 cm  +----------------+---------++ +--------------+--------++ LVOT diam:    2.30 cm  +--------------+--------++ LV SV:        64 ml    +--------------+--------++ LV SV Index:  26.04    +--------------+--------++ LVOT Area:    4.15 cm +--------------+--------++                        +--------------+--------++  +---------------+----------++ RIGHT VENTRICLE           +---------------+----------++ RV S prime:    13.60 cm/s +---------------+----------++ TAPSE (M-mode):1.9 cm     +---------------+----------++  +---------------+-------++-----------++ LEFT ATRIUM           Index       +---------------+-------++-----------++ LA diam:       4.60 cm1.89 cm/m  +---------------+-------++-----------++ LA Vol (A2C):  54.7 ml22.44 ml/m +---------------+-------++-----------++ LA Vol (A4C):  36.2 ml14.85 ml/m +---------------+-------++-----------++ LA Biplane Vol:49.0 ml20.10 ml/m  +---------------+-------++-----------++ +------------+---------++-----------++ RIGHT ATRIUM         Index       +------------+---------++-----------++ RA Area:  15.70 cm            +------------+---------++-----------++ RA Volume:  40.30 ml 16.53 ml/m +------------+---------++-----------++  +------------+-----------++ AORTIC VALVE            +------------+-----------++ LVOT Vmax:  59.20 cm/s  +------------+-----------++ LVOT Vmean: 42.100 cm/s +------------+-----------++ LVOT VTI:   0.126 m     +------------+-----------++   +-------------+-------++ AORTA                +-------------+-------++ Ao Root diam:3.30 cm +-------------+-------++  +--------------+----------++ MITRAL VALVE             +--------------+-------+ +--------------+----------++ SHUNTS                MV Area (PHT):3.03 cm   +--------------+-------+ +--------------+----------++ Systemic VTI: 0.13 m  MV PHT:       72.50 msec +--------------+-------+ +--------------+----------++ Systemic Diam:2.30 cm MV Decel Time:250 msec   +--------------+-------+ +--------------+----------++ +--------------+----------++ MV E velocity:74.65 cm/s +--------------+----------++ MV A velocity:81.50 cm/s +--------------+----------++ MV E/A ratio: 0.92       +--------------+----------++    Kirk Ruths MD Electronically signed by Kirk Ruths MD Signature Date/Time: 10/10/2018/4:36:45 PM      Lipid Panel No results found for: CHOL, TRIG, HDL, CHOLHDL, VLDL, LDLCALC, LDLDIRECT    Wt Readings from Last 3 Encounters:  12/03/18 241 lb 6.4 oz (109.5 kg)  11/23/18 244 lb (110.7 kg)  10/10/18 242 lb 3.2 oz (109.9 kg)      ASSESSMENT AND PLAN:  1  Chronic systolic CHF No hx of CP   The pt has never had a cath   EKG without Q waves    CT scans of chest do not show any significant coronary calcificaitons (though not dedicated CT for  coronary arteries)   PRobably nonisichemic Currently on coreg, hydralazine and NTG Cr is 1.6 to 1.73   I am reluctant to add ACE I or ARB    WIll review with PCP    WIll refer to EP for consideration of ICD   2  CP   Atypical  Denies at present    3   CKD  Should get established in renal clinic  4  Hx tonsillar CA   No evid of recurrence on CT   Current medicines are reviewed at length with the patient today.  The patient does not have concerns regarding medicines.  Signed, [name], MD  12/03/2018 4:07 PM    [facility] [place], [place],   [address] Phone: [phone]; Fax: [phone]

## 2018-12-03 ENCOUNTER — Encounter: Payer: Self-pay | Admitting: Internal Medicine

## 2018-12-03 ENCOUNTER — Other Ambulatory Visit: Payer: Self-pay

## 2018-12-03 ENCOUNTER — Other Ambulatory Visit: Payer: Self-pay | Admitting: Hematology and Oncology

## 2018-12-03 ENCOUNTER — Ambulatory Visit (INDEPENDENT_AMBULATORY_CARE_PROVIDER_SITE_OTHER): Payer: Medicaid Other | Admitting: Internal Medicine

## 2018-12-03 VITALS — BP 118/60 | HR 91 | Ht 78.0 in | Wt 241.4 lb

## 2018-12-03 DIAGNOSIS — I5022 Chronic systolic (congestive) heart failure: Secondary | ICD-10-CM

## 2018-12-03 DIAGNOSIS — C099 Malignant neoplasm of tonsil, unspecified: Secondary | ICD-10-CM

## 2018-12-03 NOTE — Patient Instructions (Signed)
Medication Instructions:  No changes If you need a refill on your cardiac medications before your next appointment, please call your pharmacy.   Lab work: none If you have labs (blood work) drawn today and your tests are completely normal, you will receive your results only by: Marland Kitchen MyChart Message (if you have MyChart) OR . A paper copy in the mail If you have any lab test that is abnormal or we need to change your treatment, we will call you to review the results.  Testing/Procedures: none  Follow-Up: With one of our electrophysiologists to discuss consideration of defibrillator.   Any Other Special Instructions Will Be Listed Below (If Applicable). You have been referred to [facility] Electrophysiology

## 2018-12-04 ENCOUNTER — Telehealth: Payer: Self-pay | Admitting: Hematology and Oncology

## 2018-12-04 MED FILL — oxyCODONE HCL 15 MG TABS: 15 | 23 days supply | Qty: 90 | Fill #0

## 2018-12-04 NOTE — Telephone Encounter (Signed)
Faxed last 2 office visits to [phone] to [facility].  Called patient to verify DOB because it was written down incorrectly.

## 2018-12-05 ENCOUNTER — Inpatient Hospital Stay: Payer: Medicaid Other | Attending: Hematology and Oncology

## 2018-12-05 ENCOUNTER — Other Ambulatory Visit: Payer: Self-pay

## 2018-12-05 VITALS — BP 133/82 | HR 85 | Temp 99.1°F | Resp 17

## 2018-12-05 DIAGNOSIS — C099 Malignant neoplasm of tonsil, unspecified: Secondary | ICD-10-CM | POA: Diagnosis present

## 2018-12-05 DIAGNOSIS — R112 Nausea with vomiting, unspecified: Secondary | ICD-10-CM | POA: Diagnosis not present

## 2018-12-05 DIAGNOSIS — Z95828 Presence of other vascular implants and grafts: Secondary | ICD-10-CM

## 2018-12-05 MED ORDER — SODIUM CHLORIDE 0.9% FLUSH
10.0000 mL | Freq: Once | INTRAVENOUS | Status: AC
  Administered 2018-12-05: 10 mL
  Filled 2018-12-05: qty 10

## 2018-12-05 MED ORDER — HEPARIN SOD (PORK) LOCK FLUSH 100 UNIT/ML IV SOLN
500.0000 [IU] | Freq: Once | INTRAVENOUS | Status: AC | PRN
  Administered 2018-12-05: 09:00:00 500 [IU] via INTRAVENOUS
  Filled 2018-12-05: qty 5

## 2018-12-05 NOTE — Patient Instructions (Signed)

## 2018-12-06 MED FILL — CARVEDILOL 3.125 MG TABLET: 3.125 | 30 days supply | Qty: 60 | Fill #6

## 2019-01-01 ENCOUNTER — Telehealth: Payer: Self-pay | Admitting: *Deleted

## 2019-01-01 ENCOUNTER — Other Ambulatory Visit: Payer: Self-pay | Admitting: Hematology and Oncology

## 2019-01-01 DIAGNOSIS — C099 Malignant neoplasm of tonsil, unspecified: Secondary | ICD-10-CM

## 2019-01-01 MED ORDER — OXYCODONE HCL 15 MG PO TABS: 15.0000 mg | ORAL_TABLET | Freq: Four times a day (QID) | ORAL | 0 refills | Status: DC | PRN

## 2019-01-01 MED FILL — oxyCODONE HCL 15 MG TABS: 15 | 23 days supply | Qty: 90 | Fill #0

## 2019-01-01 NOTE — Telephone Encounter (Signed)
Patient called to request a medication refill of oxycodone to [facility] please.

## 2019-01-09 ENCOUNTER — Other Ambulatory Visit: Payer: Self-pay

## 2019-01-09 ENCOUNTER — Ambulatory Visit: Payer: Medicaid Other | Admitting: Internal Medicine

## 2019-01-09 ENCOUNTER — Encounter: Payer: Self-pay | Admitting: Internal Medicine

## 2019-01-09 VITALS — BP 134/70 | HR 88 | Ht 78.0 in | Wt 241.2 lb

## 2019-01-09 DIAGNOSIS — I5022 Chronic systolic (congestive) heart failure: Secondary | ICD-10-CM

## 2019-01-09 DIAGNOSIS — I428 Other cardiomyopathies: Secondary | ICD-10-CM | POA: Diagnosis not present

## 2019-01-09 NOTE — Patient Instructions (Signed)
Medication Instructions:  Your physician recommends that you continue on your current medications as directed. Please refer to the Current Medication list given to you today.  * If you need a refill on your cardiac medications before your next appointment, please call your pharmacy.   Labwork: None ordered  Testing/Procedures: None ordered  Follow-Up: To be determined.  Dr. Caryl Comes will discuss treatment plan with Dr. Harrington Challenger.   Thank you for choosing [facility]!!    Any Other Special Instructions Will Be Listed Below (If Applicable).

## 2019-01-09 NOTE — Progress Notes (Addendum)
ELECTROPHYSIOLOGY CONSULT NOTE  Patient ID: Mason [name], MRN: [id number], DOB/AGE: [birth year] 53 y.o. Admit date: (Not on file) Date of Consult: 01/09/2019  Primary Physician: Mason [name], [place] Primary Cardiologist: PR c   Mason [name] is a 53 y.o. male who is being seen today for the evaluation of ICD at the request of PR.    HPI Mason [name] is a 53 y.o. male referred for consideration of an ICD.  He first came to cardiac attention in July 2019.  He was in the process of being treated for tonsillar carcinoma with radiation and chemotherapy.  His ejection fraction was 15% with RV dysfunction.  He was treated with carvedilol and because of a modestly elevated creatinine hydralazine and nitrates.  More recently, he denies exercise limitations.  He is able to climb stairs.  He carries what he wants.  He denies dyspnea nocturnal dyspnea or orthopnea.  He has had occasional palpitations.  Most recently 6-9 months ago.  Duration of about 1 minute.  Associated with lightheadedness.  Occasional alcohol.   DATE TEST EF   7/19 Echo   15 % RV dysfunction  2//20 Echo   35-40 % RVE w normal RV function  6/20 Echo  25-30%      Date Cr K Hgb  8/20 1.61 4.1 14.2         Managed without ACE/ARB 2/2 renal insufficiency     Past Medical History:  Diagnosis Date  . Abnormal liver function test   . Acute sinusitis, unspecified 05/18/2015  . Anemia   . Anxiety    mild new dx  . Arthritis    knees,hips  . Bilateral edema of lower extremity   . Complication of anesthesia    Pt stated " my oxygen level was slow in rising."  . Concussion    Hx: in high school x 2  . Constipation   . Dysuria 09/04/2015  . Fever   . Hyperactive gag reflex   . Hypertension   . Hypoglycemia   . Hypokalemia   . Insomnia 06/15/2015  . Knee pain, chronic   . Malnutrition ([place])   . Non-healing surgical wound 05/23/2014  . PEG (percutaneous endoscopic gastrostomy) status ([place])    . Renal failure, acute ([place])   . S/P radiation therapy 08/19/2013-10/15/2013   Right Tonstil and bilateral neck / 70 Gy in 35 fractions to gross disease, 63 Gy in 35 fractions to high risk nodal echelons, and 56 Gy in 35 fractions to intermediate risk nodal echelons  . Severe nausea and vomiting   . Status post chemotherapy    Only received 2 doses due to uncontrolled nausea and acute renal failure  . Tonsillar cancer ([place]) 07/09/13   SCCA of Right Tonsil, recurrent 2016      Surgical History:  Past Surgical History:  Procedure Laterality Date  . LAPAROSCOPIC GASTROSTOMY N/A 08/15/2013   Procedure: LAPAROSCOPIC GASTROSTOMY TUBE PLACEMENT;  Surgeon: [name], MD;  Location: [place];  Service: General;  Laterality: N/A;  . LYMPH NODE BIOPSY  03/20/14   right neck  . MULTIPLE EXTRACTIONS WITH ALVEOLOPLASTY N/A 08/01/2013   Procedure: Extraction of tooth #'s 1,15,17,31, 32 with alveoloplasty, mandibular left torus reduction, and gross debridement of remaining teeth.;  Surgeon: [name], DDS;  Location: [place];  Service: Oral Surgery;  Laterality: N/A;  . PORT-A-CATH REMOVAL N/A 05/13/2014   Procedure: REMOVAL of PORT-A-CATH;  Surgeon: [name], MD;  Location: [place];  Service: General;  Laterality: N/A;  . PORTACATH PLACEMENT Left 08/15/2013   Procedure: INSERTION PORT-A-CATH;  Surgeon: [name], MD;  Location: [place];  Service: General;  Laterality: Left;     Home Meds: Current Meds  Medication Sig  . albuterol (PROVENTIL HFA;VENTOLIN HFA) 108 (90 Base) MCG/ACT inhaler Inhale 2 puffs into the lungs every 6 (six) hours as needed for wheezing or shortness of breath.  Marland Kitchen aspirin EC 81 MG tablet Take 81 mg by mouth daily.  . carvedilol (COREG) 3.125 MG tablet TAKE 1 TABLET BY MOUTH TWICE DAILY WITH A MEAL.  . fluticasone (FLONASE) 50 MCG/ACT nasal spray Place 2 sprays into both nostrils daily.  . hydrALAZINE (APRESOLINE) 25 MG tablet Take 1 tablet (25 mg total) by mouth 3  (three) times daily.  . isosorbide mononitrate (IMDUR) 30 MG 24 hr tablet Take 1 tablet (30 mg total) by mouth daily.  Marland Kitchen levothyroxine (SYNTHROID) 25 MCG tablet TAKE 1 TABLET BY MOUTH DAILY BEFORE BREAKFAST.  Marland Kitchen lidocaine-prilocaine (EMLA) cream Apply 1 application topically as needed. (Patient taking differently: Apply 1 application topically as needed (port). )  . oxyCODONE (ROXICODONE) 15 MG immediate release tablet Take 1 tablet (15 mg total) by mouth every 6 (six) hours as needed.    Allergies:  Allergies  Allergen Reactions  . Bee Pollen     Watery eyes, runny nose, sneezing  . Pollen Extract Other (See Comments)    Watery eyes, runny nose, sneezing    Social History   Socioeconomic History  . Marital status: Married    Spouse name: Not on file  . Number of children: 1  . Years of education: Not on file  . Highest education level: Not on file  Occupational History  . Not on file  Social Needs  . Financial resource strain: Not on file  . Food insecurity    Worry: Not on file    Inability: Not on file  . Transportation needs    Medical: Not on file    Non-medical: Not on file  Tobacco Use  . Smoking status: Former Smoker    Packs/day: 1.00    Years: 20.00    Pack years: 20.00    Types: Cigarettes    Quit date: 07/16/2003    Years since quitting: 15.4  . Smokeless tobacco: Never Used  Substance and Sexual Activity  . Alcohol use: No    Comment: none years ago  . Drug use: Yes    Types: Marijuana, Benzodiazepines    Comment: years ago  . Sexual activity: Never  Lifestyle  . Physical activity    Days per week: Not on file    Minutes per session: Not on file  . Stress: Not on file  Relationships  . Social Herbalist on phone: Not on file    Gets together: Not on file    Attends religious service: Not on file    Active member of club or organization: Not on file    Attends meetings of clubs or organizations: Not on file    Relationship status: Not  on file  . Intimate partner violence    Fear of current or ex partner: Not on file    Emotionally abused: Not on file    Physically abused: Not on file    Forced sexual activity: Not on file  Other Topics Concern  . Not on file  Social History Narrative  . Not on file     Family History  Problem Relation Age of Onset  . Arthritis Mother   . Heart disease Mother   . Arthritis Father   . CVA Other   . Diabetes Other   . Heart attack Other   . Heart disease Maternal Grandmother   . CVA Maternal Grandmother      ROS:  Please see the history of present illness.     All other systems reviewed and negative.    Physical Exam: Blood pressure 134/70, pulse 88, height '6\' 6"'  (1.981 m), weight 241 lb 3.2 oz (109.4 kg), SpO2 96 %. General: Well developed, well nourished male in no acute distress. Head: Normocephalic, atraumatic, sclera non-icteric, no xanthomas, nares are without discharge. EENT: normal  Lymph Nodes:  none Neck: Negative for carotid bruits. JVD not elevated. Back:without scoliosis kyphosis Lungs: Clear bilaterally to auscultation without wheezes, rales, or rhonchi. Breathing is unlabored. Heart: RRR with S1 S2. No murmur . No rubs, or gallops appreciated. Abdomen: Soft, non-tender, non-distended with normoactive bowel sounds. No hepatomegaly. No rebound/guarding. No obvious abdominal masses. Msk:  Strength and tone appear normal for age. Extremities: No clubbing or cyanosis. No edema.  Distal pedal pulses are 2+ and equal bilaterally. Skin: Warm and Dry Neuro: Alert and oriented X 3. CN III-XII intact Grossly normal sensory and motor function . Psych:  Responds to questions appropriately with a normal affect.      Labs: Cardiac Enzymes No results for input(s): CKTOTAL, CKMB, TROPONINI in the last 72 hours. CBC Lab Results  Component Value Date   WBC 8.3 11/23/2018   HGB 14.2 11/23/2018   HCT 44.4 11/23/2018   MCV 98.7 11/23/2018   PLT 171 11/23/2018    PROTIME: No results for input(s): LABPROT, INR in the last 72 hours. Chemistry No results for input(s): NA, K, CL, CO2, BUN, CREATININE, CALCIUM, PROT, BILITOT, ALKPHOS, ALT, AST, GLUCOSE in the last 168 hours.  Invalid input(s): LABALBU Lipids No results found for: CHOL, HDL, LDLCALC, TRIG BNP No results found for: PROBNP Thyroid Function Tests: No results for input(s): TSH, T4TOTAL, T3FREE, THYROIDAB in the last 72 hours.  Invalid input(s): FREET3 Miscellaneous No results found for: DDIMER  Radiology/Studies:  No results found.  QMV:HQION 88 17/10/40 ST-T changes lead 1 L 2 V3-V6   Assessment and Plan:  Cardiomyopathy-presumed nonischemic  Tachycardia-relative sinus  Congestive heart failure-class I-II  Elevated creatinine with an eGFR of greater than 80  The patient has been rendered relatively asymptomatic on pharmacological therapy.  Further maximization of medications may be possible.  His blood pressure is relatively high in heart rate relatively fast so we will review with Dr. Harrington Challenger increasing his carvedilol towards target doses.  Also, his estimated GFR is greater than 60 given his weight and size.  Hence, will review with her also about the role of ACE inhibitors and spironolactone for his cardiomyopathy.  There may also be a role for advanced imaging to make certain that he does not have coronary artery disease.  It is appreciated that he had a CT scan that showed no coronary artery calcification.  I do not know the sensitivity of this assessment.  Will defer to Dr. Harrington Challenger.  It would however make a big difference as to whether the patient has ischemic or nonischemic cardiomyopathy.  His functional status is also quite good.  Prior to proceeding with ICD implantation, and following medication maximization, would suggest CPX to evaluate his functional status.  Class I patients with nonischemic myopathy are not included in guidelines for ICD  implantation.    Virl Axe

## 2019-01-14 MED FILL — CARVEDILOL 3.125 MG TABLET: 3.125 | 30 days supply | Qty: 60 | Fill #7

## 2019-01-22 ENCOUNTER — Telehealth: Payer: Self-pay | Admitting: *Deleted

## 2019-01-22 ENCOUNTER — Other Ambulatory Visit: Payer: Self-pay | Admitting: Hematology and Oncology

## 2019-01-22 DIAGNOSIS — C099 Malignant neoplasm of tonsil, unspecified: Secondary | ICD-10-CM

## 2019-01-22 MED ORDER — LEVOTHYROXINE SODIUM 25 MCG PO TABS: ORAL_TABLET | ORAL | 1 refills | Status: DC

## 2019-01-22 MED ORDER — OXYCODONE HCL 15 MG PO TABS: 15.0000 mg | ORAL_TABLET | Freq: Four times a day (QID) | ORAL | 0 refills | Status: DC | PRN

## 2019-01-22 MED ORDER — LIDOCAINE-PRILOCAINE 2.5-2.5 % EX CREA: 1.0000 "application " | TOPICAL_CREAM | CUTANEOUS | 11 refills | Status: DC | PRN

## 2019-01-22 MED FILL — LIDOCAINE-PRILOCAINE CREAM: 2.5-2.5 | 30 days supply | Qty: 30 | Fill #0

## 2019-01-22 MED FILL — oxyCODONE HCL 15 MG TABS: 15 | 23 days supply | Qty: 90 | Fill #0

## 2019-01-22 NOTE — Telephone Encounter (Signed)
done

## 2019-01-22 NOTE — Telephone Encounter (Signed)
Patient called for mulitple refills to go [facility].   Oxycodone Synthroid  EMLA cream

## 2019-01-30 ENCOUNTER — Other Ambulatory Visit: Payer: Self-pay

## 2019-01-30 ENCOUNTER — Encounter: Payer: Self-pay | Admitting: Hematology and Oncology

## 2019-01-30 ENCOUNTER — Inpatient Hospital Stay (HOSPITAL_BASED_OUTPATIENT_CLINIC_OR_DEPARTMENT_OTHER): Payer: Medicaid Other | Admitting: Hematology and Oncology

## 2019-01-30 ENCOUNTER — Inpatient Hospital Stay: Payer: Medicaid Other | Attending: Hematology and Oncology

## 2019-01-30 ENCOUNTER — Inpatient Hospital Stay: Payer: Medicaid Other

## 2019-01-30 ENCOUNTER — Telehealth: Payer: Self-pay

## 2019-01-30 VITALS — BP 148/114 | HR 90 | Temp 98.3°F | Resp 20 | Ht 78.0 in | Wt 238.6 lb

## 2019-01-30 DIAGNOSIS — R112 Nausea with vomiting, unspecified: Secondary | ICD-10-CM

## 2019-01-30 DIAGNOSIS — C099 Malignant neoplasm of tonsil, unspecified: Secondary | ICD-10-CM

## 2019-01-30 DIAGNOSIS — I700 Atherosclerosis of aorta: Secondary | ICD-10-CM | POA: Insufficient documentation

## 2019-01-30 DIAGNOSIS — G893 Neoplasm related pain (acute) (chronic): Secondary | ICD-10-CM

## 2019-01-30 DIAGNOSIS — Z79899 Other long term (current) drug therapy: Secondary | ICD-10-CM | POA: Diagnosis not present

## 2019-01-30 DIAGNOSIS — Z23 Encounter for immunization: Secondary | ICD-10-CM | POA: Insufficient documentation

## 2019-01-30 DIAGNOSIS — N183 Chronic kidney disease, stage 3 unspecified: Secondary | ICD-10-CM | POA: Insufficient documentation

## 2019-01-30 DIAGNOSIS — Z9223 Personal history of estrogen therapy: Secondary | ICD-10-CM | POA: Diagnosis not present

## 2019-01-30 DIAGNOSIS — Z95828 Presence of other vascular implants and grafts: Secondary | ICD-10-CM

## 2019-01-30 DIAGNOSIS — Z9221 Personal history of antineoplastic chemotherapy: Secondary | ICD-10-CM | POA: Insufficient documentation

## 2019-01-30 DIAGNOSIS — Z5111 Encounter for antineoplastic chemotherapy: Secondary | ICD-10-CM

## 2019-01-30 DIAGNOSIS — I5022 Chronic systolic (congestive) heart failure: Secondary | ICD-10-CM

## 2019-01-30 DIAGNOSIS — J439 Emphysema, unspecified: Secondary | ICD-10-CM | POA: Insufficient documentation

## 2019-01-30 DIAGNOSIS — C090 Malignant neoplasm of tonsillar fossa: Secondary | ICD-10-CM

## 2019-01-30 DIAGNOSIS — I429 Cardiomyopathy, unspecified: Secondary | ICD-10-CM | POA: Insufficient documentation

## 2019-01-30 DIAGNOSIS — I2510 Atherosclerotic heart disease of native coronary artery without angina pectoris: Secondary | ICD-10-CM | POA: Insufficient documentation

## 2019-01-30 LAB — CMP (CANCER CENTER ONLY)
ALT: 24 U/L (ref 0–44)
AST: 32 U/L (ref 15–41)
Albumin: 4 g/dL (ref 3.5–5.0)
Alkaline Phosphatase: 67 U/L (ref 38–126)
Anion gap: 11 (ref 5–15)
BUN: 16 mg/dL (ref 6–20)
CO2: 28 mmol/L (ref 22–32)
Calcium: 9.5 mg/dL (ref 8.9–10.3)
Chloride: 104 mmol/L (ref 98–111)
Creatinine: 1.8 mg/dL — ABNORMAL HIGH (ref 0.61–1.24)
GFR, Est AFR Am: 49 mL/min — ABNORMAL LOW (ref >60)
GFR, Estimated: 43 mL/min — ABNORMAL LOW (ref >60)
Glucose, Bld: 104 mg/dL — ABNORMAL HIGH (ref 70–99)
Potassium: 4.4 mmol/L (ref 3.5–5.1)
Sodium: 143 mmol/L (ref 135–145)
Total Bilirubin: 0.5 mg/dL (ref 0.3–1.2)
Total Protein: 7.2 g/dL (ref 6.5–8.1)

## 2019-01-30 LAB — CBC WITH DIFFERENTIAL/PLATELET
Abs Immature Granulocytes: 0.03 10*3/uL (ref 0.00–0.07)
Basophils Absolute: 0 10*3/uL (ref 0.0–0.1)
Basophils Relative: 0 %
Eosinophils Absolute: 0.1 10*3/uL (ref 0.0–0.5)
Eosinophils Relative: 1 %
HCT: 42.8 % (ref 39.0–52.0)
Hemoglobin: 14.2 g/dL (ref 13.0–17.0)
Immature Granulocytes: 0 %
Lymphocytes Relative: 8 %
Lymphs Abs: 0.7 10*3/uL (ref 0.7–4.0)
MCH: 32.7 pg (ref 26.0–34.0)
MCHC: 33.2 g/dL (ref 30.0–36.0)
MCV: 98.6 fL (ref 80.0–100.0)
Monocytes Absolute: 0.7 10*3/uL (ref 0.1–1.0)
Monocytes Relative: 8 %
Neutro Abs: 7.3 10*3/uL (ref 1.7–7.7)
Neutrophils Relative %: 83 %
Platelets: 186 10*3/uL (ref 150–400)
RBC: 4.34 MIL/uL (ref 4.22–5.81)
RDW: 12.9 % (ref 11.5–15.5)
WBC: 8.9 10*3/uL (ref 4.0–10.5)
nRBC: 0 % (ref 0.0–0.2)

## 2019-01-30 LAB — TSH: TSH: 4.169 u[IU]/mL — ABNORMAL HIGH (ref 0.320–4.118)

## 2019-01-30 MED ORDER — INFLUENZA VAC SPLIT QUAD 0.5 ML IM SUSY
PREFILLED_SYRINGE | INTRAMUSCULAR | Status: AC
  Filled 2019-01-30: qty 0.5

## 2019-01-30 MED ORDER — HEPARIN SOD (PORK) LOCK FLUSH 100 UNIT/ML IV SOLN
500.0000 [IU] | Freq: Once | INTRAVENOUS | Status: AC | PRN
  Administered 2019-01-30: 09:00:00 500 [IU] via INTRAVENOUS
  Filled 2019-01-30: qty 5

## 2019-01-30 MED ORDER — LEVOTHYROXINE SODIUM 50 MCG PO TABS: 50.0000 ug | ORAL_TABLET | Freq: Every day | ORAL | 1 refills | Status: DC

## 2019-01-30 MED ORDER — SODIUM CHLORIDE 0.9% FLUSH
10.0000 mL | Freq: Once | INTRAVENOUS | Status: AC
  Administered 2019-01-30: 10 mL
  Filled 2019-01-30: qty 10

## 2019-01-30 MED ORDER — INFLUENZA VAC SPLIT QUAD 0.5 ML IM SUSY
0.5000 mL | PREFILLED_SYRINGE | Freq: Once | INTRAMUSCULAR | Status: AC
  Administered 2019-01-30: 10:00:00 0.5 mL via INTRAMUSCULAR

## 2019-01-30 MED FILL — LEVOTHYROXINE 50 MCG TABLET: 50 | 90 days supply | Qty: 90 | Fill #0

## 2019-01-30 NOTE — Assessment & Plan Note (Signed)
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  We discussed briefly about narcotic refill policy 

## 2019-01-30 NOTE — Assessment & Plan Note (Addendum)
His last Ct imaging showed no signs of cancer We will discontinue his treatment permanently due to significant immune mediated reactions which has since resolved I recommend maintaining port patency with port flushes every 8 weeks I will see him back again in 6 months for further follow-up I recommend annual surveillance CT imaging unless he have new signs and symptoms to suggest cancer recurrence, due next year

## 2019-01-30 NOTE — Telephone Encounter (Signed)
-----   Message from [name], MD sent at 01/30/2019 10:59 AM EDT ----- Regarding: thyroid Is he taking his thyroid medication? His TSH is a bit elevated If he is consistent, then call in 50 mcg If he is not consistent, no need to change

## 2019-01-30 NOTE — Assessment & Plan Note (Signed)
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

## 2019-01-30 NOTE — Telephone Encounter (Signed)
Called and given below message. He is taking medication consistently. Rx sent to [facility] outpatient pharmacy for increased dosage.

## 2019-01-30 NOTE — Assessment & Plan Note (Signed)
He has stable chronic kidney disease since chemotherapy Observe closely 

## 2019-01-30 NOTE — Progress Notes (Signed)
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   INTERVAL HISTORY: Please see below for problem oriented charting. 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 FNA of right level III lymph node was inconclusive for cancer   07/25/2013 Imaging   PET scan showed locally advanced disease with abnormal lymphadenopathy in the right axilla   08/06/2013 Procedure   CT-guided biopsy of the lymphadenopathy was negative for malignancy   08/15/2013 Surgery   Patient has placement of port and feeding tube   08/19/2013 - 09/10/2013 Chemotherapy   Patient received chemotherapy with cisplatin. The patient only received 2 doses due to uncontrolled nausea and acute renal failure.   08/19/2013 - 10/15/2013 Radiation Therapy   Received Helical IMRT Tomotherapy:  Right Tonstil and bilateral neck / 70 Gy in 35 fractions to gross disease, 63 Gy in 35 fractions to high risk nodal echelons, and 56 Gy in 35 fractions to intermediate risk nodal echelons.   08/27/2013 - 08/30/2013 Hospital Admission   The patient was admitted to the hospital for uncontrolled nausea vomiting and dehydration.   02/14/2014 Imaging   PET/CT scan showed complete response to treatment   03/19/2014 Surgery   He had excisional lymph node biopsy from the right neck. Pathology was benign   05/13/2014 Surgery   He had removal of Port-A-Cath.   05/22/2014 Imaging   Repeat CT scan of the neck show no evidence of disease recurrence.   12/09/2014 Imaging   Ct neck without contrast showed persistent abnormalities on the right side of neck, indeterminate   12/25/2014 Imaging   PET CT scan showed disease recurrence.  02/10/2015 Procedure   He has placement of port   02/13/2015 - 07/13/2015 Chemotherapy   He received chemotherapy with carbo/Taxol   04/21/2015 Imaging   PET CT scan showed improved disease control   08/04/2015 Imaging   PET scan showed persistent disease    08/17/2015 -  Chemotherapy   He was started with [facility]   10/19/2015 Imaging   Ct neck showed mass-like intermediate density soft tissue at the right lateral neck recurrence site stable.    02/26/2016 Imaging   CT neck showed unchanged appearance of right neck recurrence compared to 10/19/2015 CT. No noncontrast evidence of new metastatic disease in the neck. Chronic sinusitis, progressed.   02/26/2016 Imaging   Diffuse but patchy and asymmetric partial airspace filling process (ground-glass opacity) in the lungs. This could be due to respiratory bronchiolitis, hypersensitivity pneumonitis or possible drug reaction. Atypical/viral pneumonia is also possible. Pulmonary consultation may be a helpful. A three-month follow-up noncontrast chest CT is suggested. Slight interval enlargement of mediastinal lymph nodes and a small lymph node along the left major fissure. This is most likely due to the inflammatory process in the lungs. No findings for metastatic disease involving the chest. No findings for upper abdominal metastatic disease.   02/29/2016 Adverse Reaction   His treatment is placed on hold due to possible hypersensitivity pneumonitis/drug reaction   04/14/2016 Imaging   Ct chest showed no evidence for metastatic disease within the chest. Significant interval improvement and near complete resolution of previously described diffuse bilateral predominately ground-glass pulmonary opacities, most compatible with resolving infectious/inflammatory process.   09/05/2016 Imaging   CT scan of neck and chest  1. Unchanged appearance of right neck recurrence. 2. No evidence of new metastatic disease in the neck. 3. Unremarkable and stable CT appearance of the chest. No findings suspicious for metastatic disease   07/24/2017 Imaging   1. No findings suspicious for metastatic disease in the chest. 2. No acute consolidative airspace disease to suggest a pneumonia. 3. No appreciable change in chronic  mild patchy upper lung predominant centrilobular micronodularity. If the patient is a current smoker, these findings are most compatible with smoking related interstitial lung disease. If the patient is not a current smoker, these findings suggest subacute hypersensitivity pneumonitis  or postinflammatory change. 4. Stable mild biapical radiation fibrosis. 5. Mild to moderate centrilobular emphysema with diffuse bronchial wall thickening, suggesting COPD. 6. One vessel coronary atherosclerosis.  Aortic Atherosclerosis (ICD10-I70.0) and Emphysema (ICD10-J43.9).   09/15/2017 Imaging   1. No evidence of interstitial lung disease. 2. Emphysema (ICD10-J43.9).   10/29/2017 Imaging   1. Moderate quality exam for pulmonary embolism with primary limitation of motion. No embolism identified. 2. Findings of congestive heart failure, including bilateral pleural effusions and septal thickening. 3. New thoracic adenopathy since approximately 6 weeks ago. Favor secondary to fluid overload/congestive heart failure. 4. New left apical 4 mm pulmonary nodule. Favored to represent a subpleural lymph node. Non-contrast chest CT can be considered in 12 months, given risk factors for primary bronchogenic carcinoma. This recommendation follows the consensus statement: Guidelines for Management of Incidental Pulmonary Nodules Detected on CT Images: From the Fleischner Society 2017; Radiology 2017; [id number].    10/29/2017 - 11/02/2017 Hospital Admission   He was admitted to the hospital due to shortness of breath and was found to have congestive heart failure.   10/30/2017 Imaging   Definity used; severe global reduction in LV systolic function; severe LVE; restrictive filling; mild MR; mild LAE; mild RVE with moderate RV  dysfunction; mild TR with moderate pulmonary hypertension.   01/26/2018 Imaging   1. Regression of soft tissue in the postoperative right neck favoring scarring. No new or progressive finding to  suggest recurrent disease. 2. Chest CT reported separately   01/26/2018 Imaging   1. No evidence of thoracic metastasis. 2. Port in the anterior chest wall with tip in distal    03/12/2018 Imaging   1. Trace, silent aspiration noted after a swallow consistent with aspiration of residual barium in the hypopharynx. 2. Retrograde reflux of contrast into the posterior nasopharynx with swallowing. 3. No gross esophageal abnormality 4. Initial difficulty swallowing a 13 mm barium tablet that than passes readily into the stomach once it is swallowed.   05/31/2018 Imaging   Resolution of previously seen mucosal hyperenhancement in the pharynx and larynx. Otherwise unchanged examination of the neck without evidence of recurrent disease or cervical nodal metastases.    06/07/2018 Echocardiogram   1. The left ventricle has moderately reduced systolic function, with an ejection fraction of 35-40%. The cavity size was severely dilated. Left ventricular diastolic Doppler parameters are consistent with impaired relaxation Left ventricular diffuse hypokinesis.  2. The right ventricle has normal systolic function. The cavity was moderately enlarged. There is no increase in right ventricular wall thickness.  3. The mitral valve is normal in structure.  4. The tricuspid valve is normal in structure.  5. The aortic valve is normal in structure.  6. The pulmonic valve was normal in structure.  7. The inferior vena cava was dilated in size with >50% respiratory variability.     REVIEW OF SYSTEMS:   Constitutional: Denies fevers, chills or abnormal weight loss Eyes: Denies blurriness of vision Ears, nose, mouth, throat, and face: Denies mucositis or sore throat Respiratory: Denies cough, dyspnea or wheezes Cardiovascular: Denies palpitation, chest discomfort or lower extremity swelling Gastrointestinal:  Denies nausea, heartburn or change in bowel habits Skin: Denies abnormal skin rashes Lymphatics:  Denies new lymphadenopathy or easy bruising Neurological:Denies numbness, tingling or new weaknesses Behavioral/Psych: Mood is stable, no new changes  All other systems were reviewed with the patient and are negative.  I have reviewed the past medical history, past surgical history, social history and family history with the patient and they are unchanged from previous note.  ALLERGIES:  is allergic to bee pollen and pollen extract.  MEDICATIONS:  Current Outpatient Medications  Medication Sig Dispense Refill  . albuterol (PROVENTIL HFA;VENTOLIN HFA) 108 (90 Base) MCG/ACT inhaler Inhale 2 puffs into the lungs every 6 (six) hours as needed for wheezing or shortness of breath. 1 Inhaler 6  . aspirin EC 81 MG tablet Take 81 mg by mouth daily.    . carvedilol (COREG) 3.125 MG tablet TAKE 1 TABLET BY MOUTH TWICE DAILY WITH A MEAL. 60 tablet 7  . fluticasone (FLONASE) 50 MCG/ACT nasal spray Place 2 sprays into both nostrils daily. 16 g 2  . hydrALAZINE (APRESOLINE) 25 MG tablet Take 1 tablet (25 mg total) by mouth 3 (three) times daily. 270 tablet 3  . isosorbide mononitrate (IMDUR) 30 MG 24 hr tablet Take 1 tablet (30 mg total) by mouth daily. 90 tablet 3  . levothyroxine (SYNTHROID) 25 MCG tablet TAKE 1 TABLET BY MOUTH DAILY BEFORE BREAKFAST. 90 tablet 1  . lidocaine-prilocaine (EMLA) cream Apply 1 application topically as needed (port). 30 g 11  . oxyCODONE (ROXICODONE) 15 MG immediate release tablet Take 1 tablet (15 mg total) by mouth every 6 (six) hours as needed. [place]  tablet 0   No current facility-administered medications for this visit.    Facility-Administered Medications Ordered in Other Visits  Medication Dose Route Frequency Provider Last Rate Last Dose  . sodium chloride 0.9 % injection 10 mL  10 mL Intravenous PRN Alvy Bimler, Simmie Camerer, MD   10 mL at 11/29/16 0818  . sodium chloride 0.9 % injection 10 mL  10 mL Intravenous PRN Alvy Bimler, Amaia Lavallie, MD        PHYSICAL EXAMINATION: ECOG PERFORMANCE  STATUS: 1 - Symptomatic but completely ambulatory  Vitals:   01/30/19 0935  BP: (!) 148/114  Pulse: 90  Resp: 20  Temp: 98.3 F (36.8 C)  SpO2: 99%   Filed Weights   01/30/19 0935  Weight: 238 lb 9.6 oz (108.2 kg)    GENERAL:alert, no distress and comfortable SKIN: skin color, texture, turgor are normal, no rashes or significant lesions EYES: normal, Conjunctiva are pink and non-injected, sclera clear OROPHARYNX:no exudate, no erythema and lips, buccal mucosa, and tongue normal  NECK noted well-healed surgical scar.  Neck is fibrosed from prior treatment LYMPH:  no palpable lymphadenopathy in the cervical, axillary or inguinal LUNGS: clear to auscultation and percussion with normal breathing effort HEART: regular rate & rhythm and no murmurs and no lower extremity edema ABDOMEN:abdomen soft, non-tender and normal bowel sounds Musculoskeletal:no cyanosis of digits and no clubbing  NEURO: alert & oriented x 3 with fluent speech, no focal motor/sensory deficits  LABORATORY DATA:  I have reviewed the data as listed    Component Value Date/Time   NA 143 01/30/2019 0918   NA 141 04/20/2017 1028   K 4.4 01/30/2019 0918   K 4.3 04/20/2017 1028   CL 104 01/30/2019 0918   CO2 28 01/30/2019 0918   CO2 26 04/20/2017 1028   GLUCOSE 104 (H) 01/30/2019 0918   GLUCOSE 96 04/20/2017 1028   BUN 16 01/30/2019 0918   BUN 16.7 04/20/2017 1028   CREATININE 1.80 (H) 01/30/2019 0918   CREATININE 2.0 (H) 04/20/2017 1028   CALCIUM 9.5 01/30/2019 0918   CALCIUM 9.0 04/20/2017 1028   PROT 7.2 01/30/2019 0918   PROT 7.0 04/20/2017 1028   ALBUMIN 4.0 01/30/2019 0918   ALBUMIN 4.0 04/20/2017 1028   AST 32 01/30/2019 0918   AST 20 04/20/2017 1028   ALT 24 01/30/2019 0918   ALT 17 04/20/2017 1028   ALKPHOS 67 01/30/2019 0918   ALKPHOS 61 04/20/2017 1028   BILITOT 0.5 01/30/2019 0918   BILITOT 0.49 04/20/2017 1028   GFRNONAA 43 (L) 01/30/2019 0918   GFRAA 49 (L) 01/30/2019 0918    No  results found for: SPEP, UPEP  Lab Results  Component Value Date   WBC 8.9 01/30/2019   NEUTROABS 7.3 01/30/2019   HGB 14.2 01/30/2019   HCT 42.8 01/30/2019   MCV 98.6 01/30/2019   PLT 186 01/30/2019      Chemistry      Component Value Date/Time   NA 143 01/30/2019 0918   NA 141 04/20/2017 1028   K 4.4 01/30/2019 0918   K 4.3 04/20/2017 1028   CL 104 01/30/2019 0918   CO2 28 01/30/2019 0918   CO2 26 04/20/2017 1028   BUN 16 01/30/2019 0918   BUN 16.7 04/20/2017 1028   CREATININE 1.80 (H) 01/30/2019 0918   CREATININE 2.0 (H) 04/20/2017 1028      Component Value Date/Time   CALCIUM 9.5 01/30/2019 0918   CALCIUM 9.0 04/20/2017 1028   ALKPHOS 67 01/30/2019 0918   ALKPHOS  61 04/20/2017 1028   AST 32 01/30/2019 0918   AST 20 04/20/2017 1028   ALT 24 01/30/2019 0918   ALT 17 04/20/2017 1028   BILITOT 0.5 01/30/2019 0918   BILITOT 0.49 04/20/2017 1028      All questions were answered. The patient knows to call the clinic with any problems, questions or concerns. No barriers to learning was detected.  I spent 15 minutes counseling the patient face to face. The total time spent in the appointment was 20 minutes and more than 50% was on counseling and review of test results  [name], MD 01/30/2019 10:21 AM

## 2019-01-31 ENCOUNTER — Telehealth: Payer: Self-pay | Admitting: Hematology and Oncology

## 2019-01-31 NOTE — Telephone Encounter (Signed)
I left a message regarding 3/30

## 2019-02-05 MED FILL — ISOSORBIDE MN ER 30 MG TAB: 30 | 90 days supply | Qty: 90 | Fill #1

## 2019-02-18 ENCOUNTER — Other Ambulatory Visit: Payer: Self-pay | Admitting: Hematology and Oncology

## 2019-02-18 ENCOUNTER — Telehealth: Payer: Self-pay

## 2019-02-18 DIAGNOSIS — C099 Malignant neoplasm of tonsil, unspecified: Secondary | ICD-10-CM

## 2019-02-18 MED ORDER — OXYCODONE HCL 15 MG PO TABS: 15.0000 mg | ORAL_TABLET | Freq: Four times a day (QID) | ORAL | 0 refills | Status: DC | PRN

## 2019-02-18 MED FILL — oxyCODONE HCL 15 MG TABS: 15 | 22 days supply | Qty: 90 | Fill #0

## 2019-02-18 NOTE — Telephone Encounter (Signed)
He called and left a message. Requesting Oxycodone refill sent to pharmacy.

## 2019-02-26 ENCOUNTER — Other Ambulatory Visit: Payer: Self-pay | Admitting: Internal Medicine

## 2019-02-27 ENCOUNTER — Telehealth: Payer: Self-pay | Admitting: Internal Medicine

## 2019-02-27 NOTE — Telephone Encounter (Signed)
Pt calling requesting a refill on carvedilol and isosorbide. Pt states that at his OV with Dr. Caryl Comes, it was mention that Dr. Caryl Comes would get with Dr. Harrington Challenger about increasing his medications and pt would like for this to be done before sending in refills. Would Dr. Harrington Challenger like to increase pt's medications? Please address

## 2019-02-27 NOTE — Telephone Encounter (Signed)
Go ahead and increase med dosing  1.  Careveilol 6.25 mg bid  Take for 3 wks  Follow BP  Then increase  2   Hydralazine 50 mg tid      Cut in 1/2 until 3 wks    If BP runs low can cut in 1/2  Back to 25 tid  Keep on Imdur

## 2019-02-27 NOTE — Telephone Encounter (Signed)
°*  STAT* If patient is at the pharmacy, call can be transferred to refill team.   1. Which medications need to be refilled? (please list name of each medication and dose if known) isosorbide mononitrate (IMDUR) 30 MG 24 hr tablet carvedilol (COREG) 3.125 MG tablet  2. Which pharmacy/location (including street and city if local pharmacy) is medication to be sent to? [place], [place]  3. Do they need a 30 day or 90 day supply? 90  Patient also states that from what he remembers, the MG for each of the prescriptions are supposed to be increased once he runs out but he does not remember.

## 2019-02-28 MED ORDER — HYDRALAZINE HCL 25 MG PO TABS: 25.0000 mg | ORAL_TABLET | Freq: Three times a day (TID) | ORAL | Status: DC

## 2019-02-28 MED ORDER — HYDRALAZINE HCL 25 MG PO TABS: 50.0000 mg | ORAL_TABLET | Freq: Three times a day (TID) | ORAL | 6 refills | Status: DC

## 2019-02-28 MED ORDER — CARVEDILOL 6.25 MG PO TABS: 6.2500 mg | ORAL_TABLET | Freq: Two times a day (BID) | ORAL | 3 refills | Status: DC

## 2019-02-28 MED FILL — CARVEDILOL 6.25 MG TABLET: 6.25 | 90 days supply | Qty: 180 | Fill #0

## 2019-02-28 NOTE — Telephone Encounter (Signed)
While speaking w patient he realized he has full bottles of isosorbide and hydralazine and needed more carvedilol only. I reviewed with him to increase coreg to 6.25 mg BID and in 3 weeks increase hydralazine to 50 mg TID.  He will continue same dose of IMDUR and will monitor BP and will call if consistently gets readings in 100-110 range and/or if he feels lightheaded or dizzy.  Pt appreciative for assistance today.

## 2019-03-13 ENCOUNTER — Other Ambulatory Visit: Payer: Self-pay | Admitting: Hematology and Oncology

## 2019-03-13 DIAGNOSIS — C099 Malignant neoplasm of tonsil, unspecified: Secondary | ICD-10-CM

## 2019-03-13 MED ORDER — OXYCODONE HCL 15 MG PO TABS: 15.0000 mg | ORAL_TABLET | Freq: Four times a day (QID) | ORAL | 0 refills | Status: DC | PRN

## 2019-03-13 MED FILL — oxyCODONE HCL 15 MG TABS: 15 | 22 days supply | Qty: 90 | Fill #0

## 2019-03-13 MED FILL — hydrALAZINE HCL 25 MG TABS: 25 | 90 days supply | Qty: 270 | Fill #1

## 2019-03-26 ENCOUNTER — Telehealth: Payer: Self-pay | Admitting: *Deleted

## 2019-03-26 NOTE — Telephone Encounter (Signed)
Received message from Dr. Harrington Challenger that patient should have soon follow up, this month if possible. I called patient and left message to call back in regard to this. Dr. Harrington Challenger can see him this week if he is available to come in.

## 2019-03-26 NOTE — Telephone Encounter (Signed)
Scheduled with Dr. Harrington Challenger 03/28/19.

## 2019-03-27 ENCOUNTER — Inpatient Hospital Stay: Payer: Medicaid Other | Attending: Hematology and Oncology

## 2019-03-27 ENCOUNTER — Other Ambulatory Visit: Payer: Self-pay

## 2019-03-27 DIAGNOSIS — I509 Heart failure, unspecified: Secondary | ICD-10-CM | POA: Diagnosis not present

## 2019-03-27 DIAGNOSIS — M542 Cervicalgia: Secondary | ICD-10-CM | POA: Insufficient documentation

## 2019-03-27 DIAGNOSIS — C099 Malignant neoplasm of tonsil, unspecified: Secondary | ICD-10-CM | POA: Insufficient documentation

## 2019-03-27 DIAGNOSIS — G893 Neoplasm related pain (acute) (chronic): Secondary | ICD-10-CM | POA: Diagnosis not present

## 2019-03-27 DIAGNOSIS — G629 Polyneuropathy, unspecified: Secondary | ICD-10-CM | POA: Insufficient documentation

## 2019-03-27 DIAGNOSIS — Z79899 Other long term (current) drug therapy: Secondary | ICD-10-CM | POA: Insufficient documentation

## 2019-03-27 DIAGNOSIS — Z9221 Personal history of antineoplastic chemotherapy: Secondary | ICD-10-CM | POA: Insufficient documentation

## 2019-03-27 DIAGNOSIS — K1370 Unspecified lesions of oral mucosa: Secondary | ICD-10-CM | POA: Diagnosis not present

## 2019-03-27 DIAGNOSIS — Z95828 Presence of other vascular implants and grafts: Secondary | ICD-10-CM

## 2019-03-27 MED ORDER — HEPARIN SOD (PORK) LOCK FLUSH 100 UNIT/ML IV SOLN
500.0000 [IU] | Freq: Once | INTRAVENOUS | Status: AC | PRN
  Administered 2019-03-27: 500 [IU] via INTRAVENOUS
  Filled 2019-03-27: qty 5

## 2019-03-27 MED ORDER — SODIUM CHLORIDE 0.9% FLUSH
10.0000 mL | Freq: Once | INTRAVENOUS | Status: AC
  Administered 2019-03-27: 10 mL via INTRAVENOUS
  Filled 2019-03-27: qty 10

## 2019-03-27 MED ORDER — SODIUM CHLORIDE 0.9 % IJ SOLN: 10.0000 mL | INTRAMUSCULAR | Status: DC | PRN

## 2019-03-27 NOTE — Patient Instructions (Signed)

## 2019-03-28 ENCOUNTER — Ambulatory Visit (INDEPENDENT_AMBULATORY_CARE_PROVIDER_SITE_OTHER): Payer: Medicaid Other | Admitting: Internal Medicine

## 2019-03-28 ENCOUNTER — Encounter: Payer: Self-pay | Admitting: Internal Medicine

## 2019-03-28 VITALS — BP 112/72 | HR 84 | Ht 77.5 in | Wt 244.8 lb

## 2019-03-28 DIAGNOSIS — I5043 Acute on chronic combined systolic (congestive) and diastolic (congestive) heart failure: Secondary | ICD-10-CM | POA: Diagnosis not present

## 2019-03-28 MED ORDER — METOPROLOL SUCCINATE ER 50 MG PO TB24: 50.0000 mg | ORAL_TABLET | Freq: Every day | ORAL | 3 refills | Status: DC

## 2019-03-28 MED ORDER — SACUBITRIL-VALSARTAN 24-26 MG PO TABS: 1.0000 | ORAL_TABLET | Freq: Two times a day (BID) | ORAL | 6 refills | Status: DC

## 2019-03-28 MED FILL — METOPROLOL SUCCINATE ER 50: 50 | 90 days supply | Qty: 90 | Fill #0

## 2019-03-28 NOTE — Progress Notes (Signed)
Cardiology Office Note   Date:  03/28/2019   ID:  Mason [name], DOB 1965/04/29, MRN [id number]  PCP:  [name], FNP  Cardiologist:   [name], MD   F/U of  Chronic systolic CHF     History of Present Illness: Mason [name] is a 53 y.o. male with a history of NICM  Also a history of tonsillar CA   I saw him at [facility] in July 2019  LVEF was severely down on echo at 15%.  RVEF was also reduced.   Pt denied hx of CP  He was discharged from the hospital after diuresis on carvedilol, hydralazine and NTG    The patient did well initially but then retained fluid due to salt indiscretion.   He was seen in clinic by [name] in March 2020 for CP   L sided, waxing/waining   Worse with sex, driving, changes in position.   Echo in Feb 2020  LVEF 35to 40%   RV large but RVEF normal   The pt was seen in televist on 08/09/18  Doing well   Off of Hydralazine and NTG at that time    Echo ordered    Done in JUne  LVEF 25 to 30%   REcomm restarting hydralaizne and imdur  Plan for repeat limited echo in fall    I saw the pt in Aug 2020   He was last seen in cardiology by [name] for ICD  Functional status felt to be good and recom CPX after optimization of meds Also raised questions of ischemic vs nonischemic CAD   Since seen in Sept he has done well  He denies CP  Breathing is good   No dizziness     Current Meds  Medication Sig  . albuterol (PROVENTIL HFA;VENTOLIN HFA) 108 (90 Base) MCG/ACT inhaler Inhale 2 puffs into the lungs every 6 (six) hours as needed for wheezing or shortness of breath.  Marland Kitchen aspirin EC 81 MG tablet Take 81 mg by mouth daily.  . carvedilol (COREG) 6.25 MG tablet Take 1 tablet (6.25 mg total) by mouth 2 (two) times daily.  . fluticasone (FLONASE) 50 MCG/ACT nasal spray Place 2 sprays into both nostrils daily.  . hydrALAZINE (APRESOLINE) 25 MG tablet Take 1 tablet (25 mg total) by mouth 3 (three) times daily for 20 days.  . isosorbide mononitrate (IMDUR) 30  MG 24 hr tablet Take 1 tablet (30 mg total) by mouth daily.  Marland Kitchen levothyroxine (SYNTHROID) 50 MCG tablet Take 1 tablet (50 mcg total) by mouth daily before breakfast.  . lidocaine-prilocaine (EMLA) cream Apply 1 application topically as needed (port).  Marland Kitchen oxyCODONE (ROXICODONE) 15 MG immediate release tablet Take 1 tablet (15 mg total) by mouth every 6 (six) hours as needed.     Allergies:   Bee pollen and Pollen extract   Past Medical History:  Diagnosis Date  . Abnormal liver function test   . Acute sinusitis, unspecified 05/18/2015  . Anemia   . Anxiety    mild new dx  . Arthritis    knees,hips  . Bilateral edema of lower extremity   . Complication of anesthesia    Pt stated " my oxygen level was slow in rising."  . Concussion    Hx: in high school x 2  . Constipation   . Dysuria 09/04/2015  . Fever   . Hyperactive gag reflex   . Hypertension   . Hypoglycemia   . Hypokalemia   .  Insomnia 06/15/2015  . Knee pain, chronic   . Malnutrition ([place])   . Non-healing surgical wound 05/23/2014  . PEG (percutaneous endoscopic gastrostomy) status ([place])   . Renal failure, acute ([place])   . S/P radiation therapy 08/19/2013-10/15/2013   Right Tonstil and bilateral neck / 70 Gy in 35 fractions to gross disease, 63 Gy in 35 fractions to high risk nodal echelons, and 56 Gy in 35 fractions to intermediate risk nodal echelons  . Severe nausea and vomiting   . Status post chemotherapy    Only received 2 doses due to uncontrolled nausea and acute renal failure  . Tonsillar cancer ([place]) 07/09/13   SCCA of Right Tonsil, recurrent 2016    Past Surgical History:  Procedure Laterality Date  . LAPAROSCOPIC GASTROSTOMY N/A 08/15/2013   Procedure: LAPAROSCOPIC GASTROSTOMY TUBE PLACEMENT;  Surgeon: [name], MD;  Location: [place];  Service: General;  Laterality: N/A;  . LYMPH NODE BIOPSY  03/20/14   right neck  . MULTIPLE EXTRACTIONS WITH ALVEOLOPLASTY N/A 08/01/2013   Procedure: Extraction of tooth #'s  1,15,17,31, 32 with alveoloplasty, mandibular left torus reduction, and gross debridement of remaining teeth.;  Surgeon: [name], DDS;  Location: [place];  Service: Oral Surgery;  Laterality: N/A;  . PORT-A-CATH REMOVAL N/A 05/13/2014   Procedure: REMOVAL of PORT-A-CATH;  Surgeon: [name], MD;  Location: [place];  Service: General;  Laterality: N/A;  . PORTACATH PLACEMENT Left 08/15/2013   Procedure: INSERTION PORT-A-CATH;  Surgeon: [name], MD;  Location: [place];  Service: General;  Laterality: Left;     Social History:  The patient  reports that he quit smoking about 15 years ago. His smoking use included cigarettes. He has a 20.00 pack-year smoking history. He has never used smokeless tobacco. He reports current drug use. Drugs: Marijuana and Benzodiazepines. He reports that he does not drink alcohol.   Family History:  The patient's family history includes Arthritis in his father and mother; CVA in his maternal grandmother and another family member; Diabetes in an other family member; Heart attack in an other family member; Heart disease in his maternal grandmother and mother.    ROS:  Please see the history of present illness. All other systems are reviewed and  Negative to the above problem except as noted.    PHYSICAL EXAM: VS:  BP 112/72   Pulse 84   Ht 6' 5.5" (1.969 m)   Wt 244 lb 12.8 oz (111 kg)   SpO2 92%   BMI 28.66 kg/m   GEN: Well nourished, well developed, in no acute distress  HEENT: normal  Neck: no JVD, carotid bruits, or masses Cardiac: RRR; no murmurs, rubs, or gallops,no edema  Respiratory:  clear to auscultation bilaterally, normal work of breathing GI: soft, nontender, nondistended, + BS  No hepatomegaly  MS: no deformity Moving all extremities   Skin: warm and dry, no rash Neuro:  Strength and sensation are intact Psych: euthymic mood, full affect   EKG:  EKG is not  ordered today.  Echo  10/10/18   1. The left ventricle has  severely reduced systolic function, with an ejection fraction of 20-25%. The cavity size was severely dilated. Left ventricular diastolic Doppler parameters are consistent with impaired relaxation. Left ventricular diffuse  hypokinesis.  2. The aortic valve is tricuspid. No stenosis of the aortic valve.  3. Severe global reduction in LV systolic function; mild diastolic dysfunction; severe LVE.  FINDINGS  Left Ventricle: The left ventricle has severely  reduced systolic function, with an ejection fraction of 20-25%. The cavity size was severely dilated. There is no increase in left ventricular wall thickness. Left ventricular diastolic Doppler parameters  are consistent with impaired relaxation. Left ventricular diffuse hypokinesis.  Right Ventricle: The right ventricle has normal systolic function. The cavity was normal.  Left Atrium: Left atrial size was normal in size.  Right Atrium: Right atrial size was normal in size. Right atrial pressure is estimated at 10 mmHg.  Interatrial Septum: No atrial level shunt detected by color flow Doppler.  Pericardium: There is no evidence of pericardial effusion.  Mitral Valve: The mitral valve is normal in structure. Mitral valve regurgitation is not visualized by color flow Doppler.  Tricuspid Valve: The tricuspid valve is normal in structure. Tricuspid valve regurgitation is trivial by color flow Doppler.  Aortic Valve: The aortic valve is tricuspid Aortic valve regurgitation was not visualized by color flow Doppler. There is No stenosis of the aortic valve.  Pulmonic Valve: The pulmonic valve was not well visualized. Pulmonic valve regurgitation is not visualized by color flow Doppler.  Venous: The inferior vena cava is normal in size with greater than 50% respiratory variability.  Additional Comments: Severe global reduction in LV systolic function; mild diastolic dysfunction; severe LVE.    +--------------+--------++ LEFT  VENTRICLE         +----------------+---------++ +--------------+--------++ Diastology                PLAX 2D                +----------------+---------++ +--------------+--------++ LV e' lateral:  8.05 cm/s LVIDd:        6.60 cm  +----------------+---------++ +--------------+--------++ LV E/e' lateral:9.3       LVIDs:        5.70 cm  +----------------+---------++ +--------------+--------++ LV e' medial:   5.87 cm/s LV PW:        1.10 cm  +----------------+---------++ +--------------+--------++ LV E/e' medial: 12.7      LV IVS:       1.10 cm  +----------------+---------++ +--------------+--------++ LVOT diam:    2.30 cm  +--------------+--------++ LV SV:        64 ml    +--------------+--------++ LV SV Index:  26.04    +--------------+--------++ LVOT Area:    4.15 cm +--------------+--------++                        +--------------+--------++  +---------------+----------++ RIGHT VENTRICLE           +---------------+----------++ RV S prime:    13.60 cm/s +---------------+----------++ TAPSE (M-mode):1.9 cm     +---------------+----------++  +---------------+-------++-----------++ LEFT ATRIUM           Index       +---------------+-------++-----------++ LA diam:       4.60 cm1.89 cm/m  +---------------+-------++-----------++ LA Vol (A2C):  54.7 ml22.44 ml/m +---------------+-------++-----------++ LA Vol (A4C):  36.2 ml14.85 ml/m +---------------+-------++-----------++ LA Biplane Vol:49.0 ml20.10 ml/m +---------------+-------++-----------++ +------------+---------++-----------++ RIGHT ATRIUM         Index       +------------+---------++-----------++ RA Area:    15.70 cm            +------------+---------++-----------++ RA Volume:  40.30 ml 16.53 ml/m +------------+---------++-----------++  +------------+-----------++ AORTIC VALVE             +------------+-----------++ LVOT Vmax:  59.20 cm/s  +------------+-----------++ LVOT Vmean: 42.100 cm/s +------------+-----------++ LVOT VTI:   0.126 m     +------------+-----------++   +-------------+-------++ AORTA                +-------------+-------++  Ao Root diam:3.30 cm +-------------+-------++  +--------------+----------++ MITRAL VALVE             +--------------+-------+ +--------------+----------++ SHUNTS                MV Area (PHT):3.03 cm   +--------------+-------+ +--------------+----------++ Systemic VTI: 0.13 m  MV PHT:       72.50 msec +--------------+-------+ +--------------+----------++ Systemic Diam:2.30 cm MV Decel Time:250 msec   +--------------+-------+ +--------------+----------++ +--------------+----------++ MV E velocity:74.65 cm/s +--------------+----------++ MV A velocity:81.50 cm/s +--------------+----------++ MV E/A ratio: 0.92       +--------------+----------++    Kirk Ruths MD Electronically signed by Kirk Ruths MD Signature Date/Time: 10/10/2018/4:36:45 PM      Lipid Panel No results found for: CHOL, TRIG, HDL, CHOLHDL, VLDL, LDLCALC, LDLDIRECT    Wt Readings from Last 3 Encounters:  03/28/19 244 lb 12.8 oz (111 kg)  01/30/19 238 lb 9.6 oz (108.2 kg)  01/09/19 241 lb 3.2 oz (109.4 kg)      ASSESSMENT AND PLAN:  1  Chronic systolic CHF  Pt's volume status is good, functional status seems to be good   I have reviewed with pharm.  With Cr Clearance greater than 60 will add in Entresto   Keep on Hydralazine/NTG   Switch coreg to Toprol XL  FU in 1 wk   Will need f/u BMET and BP check  I have looked at CT scans of chest    Not specific for coronary arteries but there does not appear t oba any calcifictons  He denies CP    2   CKD  Will follow BMET afte addition of Entresto    4  Hx tonsillar CA   Followed by Dr Alvy Bimler     Current medicines are reviewed at  length with the patient today.  The patient does not have concerns regarding medicines.  Signed, [name], MD  03/28/2019 2:24 PM    [address], [place], [place]  [address] Phone: [phone]; Fax: [phone]

## 2019-03-28 NOTE — Patient Instructions (Signed)
Medication Instructions:  Your physician has recommended you make the following change in your medication:  1.) stop carvedilol (Coreg) 2.) start metoprolol succinate (Toprol XL) 50 mg daily 3.) start (Entresto) 24/26 mg twice daily  *If you need a refill on your cardiac medications before your next appointment, please call your pharmacy*  Lab Work: none If you have labs (blood work) drawn today and your tests are completely normal, you will receive your results only by: Marland Kitchen MyChart Message (if you have MyChart) OR . A paper copy in the mail If you have any lab test that is abnormal or we need to change your treatment, we will call you to review the results.  Testing/Procedures: none  Follow-Up: At [facility], you and your health needs are our priority.  As part of our continuing mission to provide you with exceptional heart care, we have created designated Provider Care Teams.  These Care Teams include your primary Cardiologist (physician) and Advanced Practice Providers (APPs -  Physician Assistants and Nurse Practitioners) who all work together to provide you with the care you need, when you need it.  Other Instructions 1 week follow up with Fuller Canada, PhamD

## 2019-03-29 ENCOUNTER — Telehealth: Payer: Self-pay

## 2019-03-29 MED FILL — ENTRESTO 24 MG-26 MG TABLET: 24-26 | 30 days supply | Qty: 60 | Fill #0

## 2019-03-29 NOTE — Telephone Encounter (Signed)
He called and left a message to call him back. He saw his cardiologist yesterday and she was supposed to call Dr. Jacklynn Lewis regarding his pain/ increasing pain medication. His neuropathy pain seems like it is worse. Given appt with Dr. Alvy Bimler for 12/17 at 11 am to discuss. He verbalized understanding.

## 2019-04-04 ENCOUNTER — Other Ambulatory Visit: Payer: Self-pay

## 2019-04-04 ENCOUNTER — Encounter: Payer: Self-pay | Admitting: Hematology and Oncology

## 2019-04-04 ENCOUNTER — Inpatient Hospital Stay (HOSPITAL_BASED_OUTPATIENT_CLINIC_OR_DEPARTMENT_OTHER): Payer: Medicaid Other | Admitting: Hematology and Oncology

## 2019-04-04 VITALS — BP 113/64 | HR 84 | Temp 98.2°F | Resp 20 | Wt 245.0 lb

## 2019-04-04 DIAGNOSIS — K1370 Unspecified lesions of oral mucosa: Secondary | ICD-10-CM

## 2019-04-04 DIAGNOSIS — G893 Neoplasm related pain (acute) (chronic): Secondary | ICD-10-CM | POA: Diagnosis not present

## 2019-04-04 DIAGNOSIS — C099 Malignant neoplasm of tonsil, unspecified: Secondary | ICD-10-CM

## 2019-04-04 DIAGNOSIS — I5022 Chronic systolic (congestive) heart failure: Secondary | ICD-10-CM | POA: Diagnosis not present

## 2019-04-04 MED ORDER — OXYCODONE HCL 15 MG PO TABS: 15.0000 mg | ORAL_TABLET | Freq: Four times a day (QID) | ORAL | 0 refills | Status: DC | PRN

## 2019-04-04 MED FILL — oxyCODONE HCL 15 MG TABS: 15 | 22 days supply | Qty: 90 | Fill #0

## 2019-04-04 NOTE — Assessment & Plan Note (Signed)
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  We discussed briefly about narcotic refill policy I recommend he keeps his neck warm because cold exposure sometimes can exacerbate pain

## 2019-04-04 NOTE — Assessment & Plan Note (Signed)
His last Ct imaging showed no signs of cancer We will discontinue his treatment permanently due to significant immune mediated reactions which has since resolved I recommend maintaining port patency with port flushes every 8 weeks I will see him back again in 6 months for further follow-up I recommend annual surveillance CT imaging unless he have new signs and symptoms to suggest cancer recurrence, due next year

## 2019-04-04 NOTE — Assessment & Plan Note (Signed)
He was recently evaluated by cardiologist He will continue medical management

## 2019-04-04 NOTE — Assessment & Plan Note (Signed)
He had oral buccal mucosal lesion since his latest dentures were fitted The appearance of the lesion appears like granulation tissue but I cannot be sure I will reach out to his prior dentist to see if he can be evaluated or refer him to see oral surgeon for possible evaluation and biopsy He is not symptomatic

## 2019-04-04 NOTE — Progress Notes (Signed)
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 We will discontinue his treatment permanently due to significant immune mediated reactions which has since resolved I recommend maintaining port patency with port flushes every 8 weeks I will see him back again in 6 months for further follow-up I recommend annual surveillance CT imaging unless he have new signs and symptoms to suggest cancer recurrence, due next year  Oral lesion He had oral buccal mucosal lesion since his latest dentures were fitted The appearance of the lesion appears like granulation tissue but I cannot be sure I will reach out to his prior dentist to see if he can be evaluated or refer him to see oral surgeon for possible evaluation and biopsy He is not symptomatic  Cancer associated pain He has multifactorial pain, 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  We discussed briefly about narcotic refill policy I recommend he keeps his neck warm because cold exposure sometimes can exacerbate pain  CHF (congestive heart failure) ([place]) He was recently evaluated by cardiologist He will continue medical management   No orders of the defined types were placed in this encounter.   INTERVAL HISTORY: Please see below for problem oriented charting. He is seen today to discuss pain management and lesion inside his cheek He was recently seen by cardiologist and was noted to have high blood pressure It was brought up that possibly poorly  controlled pain could exacerbate hypertension His blood pressure today is within normal limits He had recent occasional exacerbation of pain during the wintertime Despite this, he is still able to function at work He has pain in the neck region from prior surgery as well as peripheral neuropathy from prior chemotherapy at the tips of his toes He noted a lesion in the buccal mucosa on the right side since his dentures were fitted couple months ago It does not cause any problems or pain but he noted it look somewhat abnormal He denies recent smoking  SUMMARY OF ONCOLOGIC HISTORY: Oncology History Overview Note  Tonsillar cancer, HPV positive   Primary site: Pharynx - Oropharynx (Right)   Staging method: AJCC 7th Edition   Clinical: Stage IVA (T2, N2b, M0) signed by [name], MD on 08/19/2013  1:24 PM   Summary: Stage IVA (T2, N2b, M0)     Tonsillar cancer ([place])  07/09/2013 Procedure   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 FNA of right level III lymph node was inconclusive for cancer   07/25/2013 Imaging   PET scan showed locally advanced disease with abnormal lymphadenopathy in the right axilla   08/06/2013 Procedure   CT-guided biopsy of the lymphadenopathy was negative for malignancy   08/15/2013 Surgery   Patient has placement of port and feeding tube   08/19/2013 - 09/10/2013 Chemotherapy   Patient received chemotherapy with cisplatin. The patient only received 2 doses due to uncontrolled nausea and acute renal failure.   08/19/2013 - 10/15/2013 Radiation Therapy   Received Helical IMRT Tomotherapy:  Right Tonstil and bilateral neck / 70 Gy in 35 fractions to gross disease,  63 Gy in 35 fractions to high risk nodal echelons, and 56 Gy in 35 fractions to intermediate risk nodal echelons.   08/27/2013 - 08/30/2013 Hospital Admission   The patient was admitted to the hospital for uncontrolled nausea vomiting and dehydration.   02/14/2014  Imaging   PET/CT scan showed complete response to treatment   03/19/2014 Surgery   He had excisional lymph node biopsy from the right neck. Pathology was benign   05/13/2014 Surgery   He had removal of Port-A-Cath.   05/22/2014 Imaging   Repeat CT scan of the neck show no evidence of disease recurrence.   12/09/2014 Imaging   Ct neck without contrast showed persistent abnormalities on the right side of neck, indeterminate   12/25/2014 Imaging   PET CT scan showed disease recurrence.   02/10/2015 Procedure   He has placement of port   02/13/2015 - 07/13/2015 Chemotherapy   He received chemotherapy with carbo/Taxol   04/21/2015 Imaging   PET CT scan showed improved disease control   08/04/2015 Imaging   PET scan showed persistent disease   08/17/2015 -  Chemotherapy   He was started with [facility]   10/19/2015 Imaging   Ct neck showed mass-like intermediate density soft tissue at the right lateral neck recurrence site stable.    02/26/2016 Imaging   CT neck showed unchanged appearance of right neck recurrence compared to 10/19/2015 CT. No noncontrast evidence of new metastatic disease in the neck. Chronic sinusitis, progressed.   02/26/2016 Imaging   Diffuse but patchy and asymmetric partial airspace filling process (ground-glass opacity) in the lungs. This could be due to respiratory bronchiolitis, hypersensitivity pneumonitis or possible drug reaction. Atypical/viral pneumonia is also possible. Pulmonary consultation may be a helpful. A three-month follow-up noncontrast chest CT is suggested. Slight interval enlargement of mediastinal lymph nodes and a small lymph node along the left major fissure. This is most likely due to the inflammatory process in the lungs. No findings for metastatic disease involving the chest. No findings for upper abdominal metastatic disease.   02/29/2016 Adverse Reaction   His treatment is placed on hold due to possible hypersensitivity pneumonitis/drug  reaction   04/14/2016 Imaging   Ct chest showed no evidence for metastatic disease within the chest. Significant interval improvement and near complete resolution of previously described diffuse bilateral predominately ground-glass pulmonary opacities, most compatible with resolving infectious/inflammatory process.   09/05/2016 Imaging   CT scan of neck and chest  1. Unchanged appearance of right neck recurrence. 2. No evidence of new metastatic disease in the neck. 3. Unremarkable and stable CT appearance of the chest. No findings suspicious for metastatic disease   07/24/2017 Imaging   1. No findings suspicious for metastatic disease in the chest. 2. No acute consolidative airspace disease to suggest a pneumonia. 3. No appreciable change in chronic mild patchy upper lung predominant centrilobular micronodularity. If the patient is a current smoker, these findings are most compatible with smoking related interstitial lung disease. If the patient is not a current smoker, these findings suggest subacute hypersensitivity pneumonitis  or postinflammatory change. 4. Stable mild biapical radiation fibrosis. 5. Mild to moderate centrilobular emphysema with diffuse bronchial wall thickening, suggesting COPD. 6. One vessel coronary atherosclerosis.  Aortic Atherosclerosis (ICD10-I70.0) and Emphysema (ICD10-J43.9).   09/15/2017 Imaging   1. No evidence of interstitial lung disease. 2. Emphysema (ICD10-J43.9).   10/29/2017 Imaging   1. Moderate quality exam for pulmonary embolism with primary limitation of motion. No embolism identified. 2. Findings of congestive  heart failure, including bilateral pleural effusions and septal thickening. 3. New thoracic adenopathy since approximately 6 weeks ago. Favor secondary to fluid overload/congestive heart failure. 4. New left apical 4 mm pulmonary nodule. Favored to represent a subpleural lymph node. Non-contrast chest CT can be considered in 12 months, given  risk factors for primary bronchogenic carcinoma. This recommendation follows the consensus statement: Guidelines for Management of Incidental Pulmonary Nodules Detected on CT Images: From the Fleischner Society 2017; Radiology 2017; [id number].    10/29/2017 - 11/02/2017 Hospital Admission   He was admitted to the hospital due to shortness of breath and was found to have congestive heart failure.   10/30/2017 Imaging   Definity used; severe global reduction in LV systolic function; severe LVE; restrictive filling; mild MR; mild LAE; mild RVE with moderate RV dysfunction; mild TR with moderate pulmonary hypertension.   01/26/2018 Imaging   1. Regression of soft tissue in the postoperative right neck favoring scarring. No new or progressive finding to suggest recurrent disease. 2. Chest CT reported separately   01/26/2018 Imaging   1. No evidence of thoracic metastasis. 2. Port in the anterior chest wall with tip in distal    03/12/2018 Imaging   1. Trace, silent aspiration noted after a swallow consistent with aspiration of residual barium in the hypopharynx. 2. Retrograde reflux of contrast into the posterior nasopharynx with swallowing. 3. No gross esophageal abnormality 4. Initial difficulty swallowing a 13 mm barium tablet that than passes readily into the stomach once it is swallowed.   05/31/2018 Imaging   Resolution of previously seen mucosal hyperenhancement in the pharynx and larynx. Otherwise unchanged examination of the neck without evidence of recurrent disease or cervical nodal metastases.    06/07/2018 Echocardiogram   1. The left ventricle has moderately reduced systolic function, with an ejection fraction of 35-40%. The cavity size was severely dilated. Left ventricular diastolic Doppler parameters are consistent with impaired relaxation Left ventricular diffuse hypokinesis.  2. The right ventricle has normal systolic function. The cavity was moderately enlarged. There is  no increase in right ventricular wall thickness.  3. The mitral valve is normal in structure.  4. The tricuspid valve is normal in structure.  5. The aortic valve is normal in structure.  6. The pulmonic valve was normal in structure.  7. The inferior vena cava was dilated in size with >50% respiratory variability.     REVIEW OF SYSTEMS:   Constitutional: Denies fevers, chills or abnormal weight loss Eyes: Denies blurriness of vision Ears, nose, mouth, throat, and face: Denies mucositis or sore throat Respiratory: Denies cough, dyspnea or wheezes Cardiovascular: Denies palpitation, chest discomfort or lower extremity swelling Gastrointestinal:  Denies nausea, heartburn or change in bowel habits Skin: Denies abnormal skin rashes Lymphatics: Denies new lymphadenopathy or easy bruising Neurological:Denies numbness, tingling or new weaknesses Behavioral/Psych: Mood is stable, no new changes  All other systems were reviewed with the patient and are negative.  I have reviewed the past medical history, past surgical history, social history and family history with the patient and they are unchanged from previous note.  ALLERGIES:  is allergic to bee pollen and pollen extract.  MEDICATIONS:  Current Outpatient Medications  Medication Sig Dispense Refill  . albuterol (PROVENTIL HFA;VENTOLIN HFA) 108 (90 Base) MCG/ACT inhaler Inhale 2 puffs into the lungs every 6 (six) hours as needed for wheezing or shortness of breath. 1 Inhaler 6  . aspirin EC 81 MG tablet Take 81 mg by mouth daily.    Marland Kitchen  fluticasone (FLONASE) 50 MCG/ACT nasal spray Place 2 sprays into both nostrils daily. 16 g 2  . hydrALAZINE (APRESOLINE) 25 MG tablet Take 1 tablet (25 mg total) by mouth 3 (three) times daily for 20 days.    . isosorbide mononitrate (IMDUR) 30 MG 24 hr tablet Take 1 tablet (30 mg total) by mouth daily. 90 tablet 3  . levothyroxine (SYNTHROID) 50 MCG tablet Take 1 tablet (50 mcg total) by mouth daily before  breakfast. 90 tablet 1  . lidocaine-prilocaine (EMLA) cream Apply 1 application topically as needed (port). 30 g 11  . metoprolol succinate (TOPROL-XL) 50 MG 24 hr tablet Take 1 tablet (50 mg total) by mouth daily. Take with or immediately following a meal. 90 tablet 3  . oxyCODONE (ROXICODONE) 15 MG immediate release tablet Take 1 tablet (15 mg total) by mouth every 6 (six) hours as needed. 90 tablet 0  . sacubitril-valsartan (ENTRESTO) 24-26 MG Take 1 tablet by mouth 2 (two) times daily. 60 tablet 6   No current facility-administered medications for this visit.   Facility-Administered Medications Ordered in Other Visits  Medication Dose Route Frequency Provider Last Rate Last Admin  . sodium chloride 0.9 % injection 10 mL  10 mL Intravenous PRN Alvy Bimler, Lou Irigoyen, MD   10 mL at 11/29/16 0818  . sodium chloride 0.9 % injection 10 mL  10 mL Intravenous PRN Alvy Bimler, Deysy Schabel, MD        PHYSICAL EXAMINATION: ECOG PERFORMANCE STATUS: 1 - Symptomatic but completely ambulatory  Vitals:   04/04/19 1126  BP: 113/64  Pulse: 84  Resp: 20  Temp: 98.2 F (36.8 C)  SpO2: 99%   Filed Weights   04/04/19 1126  Weight: 245 lb (111.1 kg)    GENERAL:alert, no distress and comfortable SKIN: skin color, texture, turgor are normal, no rashes or significant lesions EYES: normal, Conjunctiva are pink and non-injected, sclera clear OROPHARYNX noted lesion in the cheek resembling granulation tissue NECK: Neck is hard and fibrosed from prior surgery and radiation LYMPH:  no palpable lymphadenopathy in the cervical, axillary or inguinal LUNGS: clear to auscultation and percussion with normal breathing effort HEART: regular rate & rhythm and no murmurs and no lower extremity edema ABDOMEN:abdomen soft, non-tender and normal bowel sounds Musculoskeletal:no cyanosis of digits and no clubbing  NEURO: alert & oriented x 3 with fluent speech, no focal motor/sensory deficits  LABORATORY DATA:  I have reviewed the data  as listed    Component Value Date/Time   NA 143 01/30/2019 0918   NA 141 04/20/2017 1028   K 4.4 01/30/2019 0918   K 4.3 04/20/2017 1028   CL 104 01/30/2019 0918   CO2 28 01/30/2019 0918   CO2 26 04/20/2017 1028   GLUCOSE 104 (H) 01/30/2019 0918   GLUCOSE 96 04/20/2017 1028   BUN 16 01/30/2019 0918   BUN 16.7 04/20/2017 1028   CREATININE 1.80 (H) 01/30/2019 0918   CREATININE 2.0 (H) 04/20/2017 1028   CALCIUM 9.5 01/30/2019 0918   CALCIUM 9.0 04/20/2017 1028   PROT 7.2 01/30/2019 0918   PROT 7.0 04/20/2017 1028   ALBUMIN 4.0 01/30/2019 0918   ALBUMIN 4.0 04/20/2017 1028   AST 32 01/30/2019 0918   AST 20 04/20/2017 1028   ALT 24 01/30/2019 0918   ALT 17 04/20/2017 1028   ALKPHOS 67 01/30/2019 0918   ALKPHOS 61 04/20/2017 1028   BILITOT 0.5 01/30/2019 0918   BILITOT 0.49 04/20/2017 1028   GFRNONAA 43 (L) 01/30/2019 0918   GFRAA  49 (L) 01/30/2019 0918    No results found for: SPEP, UPEP  Lab Results  Component Value Date   WBC 8.9 01/30/2019   NEUTROABS 7.3 01/30/2019   HGB 14.2 01/30/2019   HCT 42.8 01/30/2019   MCV 98.6 01/30/2019   PLT 186 01/30/2019      Chemistry      Component Value Date/Time   NA 143 01/30/2019 0918   NA 141 04/20/2017 1028   K 4.4 01/30/2019 0918   K 4.3 04/20/2017 1028   CL 104 01/30/2019 0918   CO2 28 01/30/2019 0918   CO2 26 04/20/2017 1028   BUN 16 01/30/2019 0918   BUN 16.7 04/20/2017 1028   CREATININE 1.80 (H) 01/30/2019 0918   CREATININE 2.0 (H) 04/20/2017 1028      Component Value Date/Time   CALCIUM 9.5 01/30/2019 0918   CALCIUM 9.0 04/20/2017 1028   ALKPHOS 67 01/30/2019 0918   ALKPHOS 61 04/20/2017 1028   AST 32 01/30/2019 0918   AST 20 04/20/2017 1028   ALT 24 01/30/2019 0918   ALT 17 04/20/2017 1028   BILITOT 0.5 01/30/2019 0918   BILITOT 0.49 04/20/2017 1028       The picture above is taken with patient's permission All questions were answered. The patient knows to call the clinic with any problems,  questions or concerns. No barriers to learning was detected.  I spent 15 minutes counseling the patient face to face. The total time spent in the appointment was 20 minutes and more than 50% was on counseling and review of test results  [name], MD 04/04/2019 1:02 PM

## 2019-04-05 ENCOUNTER — Ambulatory Visit: Payer: Medicaid Other | Admitting: Pharmacist

## 2019-04-05 ENCOUNTER — Telehealth: Payer: Self-pay | Admitting: Internal Medicine

## 2019-04-05 NOTE — Telephone Encounter (Signed)
Pt had follow up with [facility] today. Unsure who worked on pt's prior authroization.

## 2019-04-05 NOTE — Telephone Encounter (Signed)
Pt no showed for his appt - I have not seen him in clinic and have not been working on any prior authorizations for him.

## 2019-04-05 NOTE — Progress Notes (Deleted)
Patient ID: TOLIVER TRUSH                 DOB: [birth year]                      MRN: [id number]     HPI: Mason [name] is a 53 y.o. male referred by Dr. Harrington Challenger to pharmacy clinic for HF medication optimization. PMH is significant for NICM, HFrEF with LVEF 25-30% in 09/2018, HTN, and tonsillar cancer. Pt was seen 03/28/19 and BP was 112/72. Carvedilol was changed to Toprol to allow for more BP room to add on low dose Entresto. He presents today for follow up.\  Burlison toprol or entresto if bp and bmet allow  bp ok at home off of coreg? Any dizziness?  Current HTN/HF meds: Toprol 50mg  daily, Entresto 24-26mg  BID, hydralazine 25mg  TID, Imdur 30mg  daily Previously tried:   BP goal: <130/80mmHg  Family History: The patient's family history includes Arthritis in his father and mother; CVA in his maternal grandmother and another family member; Diabetes in an other family member; Heart attack in an other family member; Heart disease in his maternal grandmother and mother.   Social History: The patient  reports that he quit smoking about 15 years ago. His smoking use included cigarettes. He has a 20.00 pack-year smoking history. He has never used smokeless tobacco. He reports current drug use. Drugs: Marijuana and Benzodiazepines. He reports that he does not drink alcohol.   Diet:   Exercise:   Home BP readings:   Wt Readings from Last 3 Encounters:  04/04/19 245 lb (111.1 kg)  03/28/19 244 lb 12.8 oz (111 kg)  01/30/19 238 lb 9.6 oz (108.2 kg)   BP Readings from Last 3 Encounters:  04/04/19 113/64  03/28/19 112/72  01/30/19 (!) 148/114   Pulse Readings from Last 3 Encounters:  04/04/19 84  03/28/19 84  01/30/19 90    Renal function: CrCl cannot be calculated (Patient's most recent lab result is older than the maximum 21 days allowed.).  Past Medical History:  Diagnosis Date  . Abnormal liver function test   . Acute sinusitis, unspecified 05/18/2015  . Anemia   .  Anxiety    mild new dx  . Arthritis    knees,hips  . Bilateral edema of lower extremity   . Complication of anesthesia    Pt stated " my oxygen level was slow in rising."  . Concussion    Hx: in high school x 2  . Constipation   . Dysuria 09/04/2015  . Fever   . Hyperactive gag reflex   . Hypertension   . Hypoglycemia   . Hypokalemia   . Insomnia 06/15/2015  . Knee pain, chronic   . Malnutrition ([place])   . Non-healing surgical wound 05/23/2014  . PEG (percutaneous endoscopic gastrostomy) status ([place])   . Renal failure, acute ([place])   . S/P radiation therapy 08/19/2013-10/15/2013   Right Tonstil and bilateral neck / 70 Gy in 35 fractions to gross disease, 63 Gy in 35 fractions to high risk nodal echelons, and 56 Gy in 35 fractions to intermediate risk nodal echelons  . Severe nausea and vomiting   . Status post chemotherapy    Only received 2 doses due to uncontrolled nausea and acute renal failure  . Tonsillar cancer ([place]) 07/09/13   SCCA of Right Tonsil, recurrent 2016    Current Outpatient Medications on File Prior to Visit  Medication Sig Dispense Refill  .  albuterol (PROVENTIL HFA;VENTOLIN HFA) 108 (90 Base) MCG/ACT inhaler Inhale 2 puffs into the lungs every 6 (six) hours as needed for wheezing or shortness of breath. 1 Inhaler 6  . aspirin EC 81 MG tablet Take 81 mg by mouth daily.    . fluticasone (FLONASE) 50 MCG/ACT nasal spray Place 2 sprays into both nostrils daily. 16 g 2  . hydrALAZINE (APRESOLINE) 25 MG tablet Take 1 tablet (25 mg total) by mouth 3 (three) times daily for 20 days.    . isosorbide mononitrate (IMDUR) 30 MG 24 hr tablet Take 1 tablet (30 mg total) by mouth daily. 90 tablet 3  . levothyroxine (SYNTHROID) 50 MCG tablet Take 1 tablet (50 mcg total) by mouth daily before breakfast. 90 tablet 1  . lidocaine-prilocaine (EMLA) cream Apply 1 application topically as needed (port). 30 g 11  . metoprolol succinate (TOPROL-XL) 50 MG 24 hr tablet Take 1 tablet (50 mg  total) by mouth daily. Take with or immediately following a meal. 90 tablet 3  . oxyCODONE (ROXICODONE) 15 MG immediate release tablet Take 1 tablet (15 mg total) by mouth every 6 (six) hours as needed. 90 tablet 0  . sacubitril-valsartan (ENTRESTO) 24-26 MG Take 1 tablet by mouth 2 (two) times daily. 60 tablet 6   Current Facility-Administered Medications on File Prior to Visit  Medication Dose Route Frequency Provider Last Rate Last Admin  . sodium chloride 0.9 % injection 10 mL  10 mL Intravenous PRN Alvy Bimler, Ni, MD   10 mL at 11/29/16 0818  . sodium chloride 0.9 % injection 10 mL  10 mL Intravenous PRN [name], MD        Allergies  Allergen Reactions  . Bee Pollen     Watery eyes, runny nose, sneezing  . Pollen Extract Other (See Comments)    Watery eyes, runny nose, sneezing     Assessment/Plan:  1. Hypertension -

## 2019-04-05 NOTE — Telephone Encounter (Signed)
Sharra from [employer] calling to confirm prior authorization for sacubitril-valsartan (ENTRESTO) 24-26 MG

## 2019-04-08 ENCOUNTER — Telehealth: Payer: Self-pay

## 2019-04-08 NOTE — Telephone Encounter (Addendum)
**Note De-Identified Jazzlene Huot Obfuscation** I am unaware of a Entresto PA as well. [employer] will often times call concerning a PA that they assume needs to be done according to their records from the last time we did a PA through them. I will check on this.

## 2019-04-08 NOTE — Telephone Encounter (Signed)
Called Dr. Diona Browner office they are closed until 12/28. Faxed referral to office at [phone].

## 2019-04-09 NOTE — Telephone Encounter (Signed)
**Note De-Identified Elliotte Marsalis Obfuscation** I called [facility] [employer] and did an [place] PA over the phone with Lavon Paganini as advised by covermymeds.  Lavon Paganini states that we can call them back [phone]) in 24 hrs for their determination. PA Reference#: [id number]

## 2019-04-15 ENCOUNTER — Telehealth: Payer: Self-pay

## 2019-04-15 NOTE — Telephone Encounter (Signed)
Called him back and given appt details for Dr. Hoyt Koch for 1/6 at 1230. He verbalized understanding.

## 2019-04-15 NOTE — Telephone Encounter (Signed)
Called referral to Dr. Stefanie Libel office and faxed referral to [phone]. Appt scheduled for 1/6 at 12:30.  Called and left below message for Mr. Linarez. Ask him to call the office back.

## 2019-04-16 NOTE — Telephone Encounter (Signed)
I called [place] [employer] and s/w [place]. Per Donny Pique this PA has been approved until 04/03/2020.  I have notified the pts pharmacy [facility]) of this approval.

## 2019-04-24 ENCOUNTER — Telehealth: Payer: Self-pay

## 2019-04-24 ENCOUNTER — Other Ambulatory Visit: Payer: Self-pay | Admitting: Hematology and Oncology

## 2019-04-24 DIAGNOSIS — C099 Malignant neoplasm of tonsil, unspecified: Secondary | ICD-10-CM

## 2019-04-24 NOTE — Telephone Encounter (Signed)
Pt called to update MD that he was seen by oral surgeon today.  Per patient cyst will be removed on 05/06/2019, and reports he was informed cyst is non-cancerous.  Will forward to MD so she is aware.

## 2019-04-25 MED FILL — oxyCODONE HCL 15 MG TABS: 15 | 23 days supply | Qty: 90 | Fill #0

## 2019-04-25 NOTE — Telephone Encounter (Signed)
Thanks I will keep an eye on the final path

## 2019-05-01 MED FILL — ENTRESTO 24 MG-26 MG TABLET: 24-26 | 30 days supply | Qty: 60 | Fill #1

## 2019-05-14 ENCOUNTER — Telehealth: Payer: Self-pay

## 2019-05-14 ENCOUNTER — Other Ambulatory Visit: Payer: Self-pay | Admitting: Hematology and Oncology

## 2019-05-14 DIAGNOSIS — C099 Malignant neoplasm of tonsil, unspecified: Secondary | ICD-10-CM

## 2019-05-14 MED ORDER — LEVOTHYROXINE SODIUM 50 MCG PO TABS: 50.0000 ug | ORAL_TABLET | Freq: Every day | ORAL | 1 refills | Status: DC

## 2019-05-14 MED ORDER — OXYCODONE HCL 15 MG PO TABS: 15.0000 mg | ORAL_TABLET | Freq: Four times a day (QID) | ORAL | 0 refills | Status: DC | PRN

## 2019-05-14 MED FILL — LEVOTHYROXINE SODIUM 50 MCG: 50 | 90 days supply | Qty: 90 | Fill #1

## 2019-05-14 NOTE — Telephone Encounter (Signed)
done

## 2019-05-14 NOTE — Telephone Encounter (Signed)
Called back and told him Rx's sent by Dr. Alvy Bimler. He verbalized understanding.

## 2019-05-14 NOTE — Telephone Encounter (Signed)
He called and left a message. He is having surgery on his mouth tomorrow. He needs refills on Oxycodone and Synthroid. Ask that Rx's be sent to pharmacy.

## 2019-05-15 ENCOUNTER — Other Ambulatory Visit: Payer: Self-pay | Admitting: Oral Surgery

## 2019-05-16 MED FILL — oxyCODONE HCL 15 MG TABS: 15 | 22 days supply | Qty: 90 | Fill #0

## 2019-05-20 MED FILL — ISOSORBIDE MN ER 30 MG TAB: 30 | 90 days supply | Qty: 90 | Fill #2

## 2019-05-22 ENCOUNTER — Other Ambulatory Visit: Payer: Self-pay

## 2019-05-22 ENCOUNTER — Inpatient Hospital Stay: Payer: Medicaid Other | Attending: Hematology and Oncology

## 2019-05-22 ENCOUNTER — Inpatient Hospital Stay: Payer: Medicaid Other

## 2019-05-22 ENCOUNTER — Telehealth: Payer: Self-pay | Admitting: *Deleted

## 2019-05-22 VITALS — BP 148/82 | HR 62 | Temp 98.4°F | Resp 19

## 2019-05-22 DIAGNOSIS — C099 Malignant neoplasm of tonsil, unspecified: Secondary | ICD-10-CM

## 2019-05-22 DIAGNOSIS — Z95828 Presence of other vascular implants and grafts: Secondary | ICD-10-CM

## 2019-05-22 DIAGNOSIS — C090 Malignant neoplasm of tonsillar fossa: Secondary | ICD-10-CM | POA: Diagnosis not present

## 2019-05-22 DIAGNOSIS — R112 Nausea with vomiting, unspecified: Secondary | ICD-10-CM

## 2019-05-22 DIAGNOSIS — Z5111 Encounter for antineoplastic chemotherapy: Secondary | ICD-10-CM

## 2019-05-22 LAB — CBC WITH DIFFERENTIAL/PLATELET
Abs Immature Granulocytes: 0.02 10*3/uL (ref 0.00–0.07)
Basophils Absolute: 0 10*3/uL (ref 0.0–0.1)
Basophils Relative: 1 %
Eosinophils Absolute: 0.1 10*3/uL (ref 0.0–0.5)
Eosinophils Relative: 2 %
HCT: 44.9 % (ref 39.0–52.0)
Hemoglobin: 15 g/dL (ref 13.0–17.0)
Immature Granulocytes: 0 %
Lymphocytes Relative: 11 %
Lymphs Abs: 0.7 10*3/uL (ref 0.7–4.0)
MCH: 33.7 pg (ref 26.0–34.0)
MCHC: 33.4 g/dL (ref 30.0–36.0)
MCV: 100.9 fL — ABNORMAL HIGH (ref 80.0–100.0)
Monocytes Absolute: 0.5 10*3/uL (ref 0.1–1.0)
Monocytes Relative: 9 %
Neutro Abs: 4.8 10*3/uL (ref 1.7–7.7)
Neutrophils Relative %: 77 %
Platelets: 174 10*3/uL (ref 150–400)
RBC: 4.45 MIL/uL (ref 4.22–5.81)
RDW: 12.9 % (ref 11.5–15.5)
WBC: 6.2 10*3/uL (ref 4.0–10.5)
nRBC: 0 % (ref 0.0–0.2)

## 2019-05-22 LAB — CMP (CANCER CENTER ONLY)
ALT: 23 U/L (ref 0–44)
AST: 28 U/L (ref 15–41)
Albumin: 4.1 g/dL (ref 3.5–5.0)
Alkaline Phosphatase: 61 U/L (ref 38–126)
Anion gap: 11 (ref 5–15)
BUN: 15 mg/dL (ref 6–20)
CO2: 25 mmol/L (ref 22–32)
Calcium: 8.8 mg/dL — ABNORMAL LOW (ref 8.9–10.3)
Chloride: 105 mmol/L (ref 98–111)
Creatinine: 1.78 mg/dL — ABNORMAL HIGH (ref 0.61–1.24)
GFR, Est AFR Am: 50 mL/min — ABNORMAL LOW (ref >60)
GFR, Estimated: 43 mL/min — ABNORMAL LOW (ref >60)
Glucose, Bld: 89 mg/dL (ref 70–99)
Potassium: 4 mmol/L (ref 3.5–5.1)
Sodium: 141 mmol/L (ref 135–145)
Total Bilirubin: 0.6 mg/dL (ref 0.3–1.2)
Total Protein: 7.4 g/dL (ref 6.5–8.1)

## 2019-05-22 LAB — TSH: TSH: 3.115 u[IU]/mL (ref 0.320–4.118)

## 2019-05-22 MED ORDER — SODIUM CHLORIDE 0.9% FLUSH
10.0000 mL | Freq: Once | INTRAVENOUS | Status: AC
  Administered 2019-05-22: 09:00:00 10 mL
  Filled 2019-05-22: qty 10

## 2019-05-22 MED ORDER — HEPARIN SOD (PORK) LOCK FLUSH 100 UNIT/ML IV SOLN
500.0000 [IU] | Freq: Once | INTRAVENOUS | Status: AC | PRN
  Administered 2019-05-22: 09:00:00 500 [IU] via INTRAVENOUS
  Filled 2019-05-22: qty 5

## 2019-05-22 MED FILL — FLUCONAZOLE 150 MG TABS: 150 | 7 days supply | Qty: 2 | Fill #0

## 2019-05-22 NOTE — Patient Instructions (Signed)
Tunneled Central Venous Catheter Flushing Guide  It is important to flush your tunneled central venous catheter each time you use it, both before and after you use it. Flushing your catheter will help prevent it from clogging. What are the risks? Risks may include:  Infection.  Air getting into the catheter and bloodstream. Supplies needed:  A clean pair of gloves.  A disinfecting wipe. Use an alcohol wipe, chlorhexidine wipe, or iodine wipe as told by your health care provider.  A 10 mL syringe that has been prefilled with saline solution.  An empty 10 mL syringe, if a substance called heparin was injected into your catheter. How to flush your catheter When you flush your catheter, make sure you follow any specific instructions from your health care provider or the manufacturer. These are general guidelines. Flushing your catheter before use If there is heparin in your catheter: 1. Wash your hands with soap and water. 2. Put on gloves. 3. Scrub the injection cap for a minimum of 15 seconds with a disinfecting wipe. 4. Unclamp the catheter. 5. Attach the empty syringe to the injection cap. 6. Pull the syringe plunger back and withdraw 10 mL of blood. 7. Place the syringe into an appropriate waste container. 8. Scrub the injection cap for 15 seconds with a disinfecting wipe. 9. Attach the prefilled syringe to the injection cap. 10. Flush the catheter by pushing the plunger forward until all the liquid from the syringe is in the catheter. 11. Remove the syringe from the injection cap. 12. Clamp the catheter. If there is no heparin in your catheter: 1. Wash your hands with soap and water. 2. Put on gloves. 3. Scrub the injection cap for 15 seconds with a disinfecting wipe. 4. Unclamp the catheter. 5. Attach the prefilled syringe to the injection cap. 6. Flush the catheter by pushing the plunger forward until 5 mL of the liquid from the syringe is in the catheter. 7. Pull back on  the syringe until you see blood in the catheter. 8. If you have been asked to collect any blood, follow your health care provider's instructions. Otherwise, flush the catheter with the rest of the solution from the syringe. 9. Remove the syringe from the injection cap. 10. Clamp the catheter.  Flushing your catheter after use 1. Wash your hands with soap and water. 2. Put on gloves. 3. Scrub the injection cap for 15 seconds with a disinfecting wipe. 4. Unclamp the catheter. 5. Attach the prefilled syringe to the injection cap. 6. Flush the catheter by pushing the plunger forward until all of the liquid from the syringe is in the catheter. 7. Remove the syringe from the injection cap. 8. Clamp the catheter. Problems and solutions  If blood cannot be completely cleared from the injection cap, you may need to have the injection cap replaced.  If the catheter is difficult to flush, use the pulsing method. The pulsing method involves pushing only a few milliliters of solution into the catheter at a time and pausing between pushes.  If you do not see blood in the catheter when you pull back on the syringe, change your body position, such as by raising your arms above your head. Take a deep breath and cough. Then, pull back on the syringe. If you still do not see blood, flush the catheter with a small amount of solution. Then, change positions again and take a breath or cough. Pull back on the syringe again. If you still do not see   blood, finish flushing the catheter and contact your health care provider. Do not use your catheter until your health care provider says it is okay. General tips  Have someone help you flush your catheter, if possible.  Do not force fluid through your catheter.  Do not use a syringe that is larger or smaller than 10 mL. Using a smaller syringe can make the catheter burst.  Do not use your catheter without flushing it first if it has heparin in it. Contact a health  care provider if:  You cannot see any blood in the catheter when you flush it before using it.  Your catheter is difficult to flush. Get help right away if:  You cannot flush the catheter.  The catheter leaks when you flush it or when there is fluid in it.  There are cracks or breaks in the catheter. Summary  It is important to flush your tunneled central venous catheter each time you use it, both before and after you use it.  Scrub the injection cap for 15 seconds with a disinfecting wipe before and after you flush it.  When you flush your catheter, make sure you follow any specific instructions from your health care provider or the manufacturer.  Get help right away if you cannot flush the catheter. This information is not intended to replace advice given to you by your health care provider. Make sure you discuss any questions you have with your health care provider. Document Revised: 12/28/2018 Document Reviewed: 06/20/2018 Elsevier Patient Education  2020 [employer]  

## 2019-05-22 NOTE — Telephone Encounter (Signed)
Telephone call to patient with message left to discuss biopsy results. Per Dr. Alvy Bimler, it is not cancer and his oral surgeon will discuss further results with him.

## 2019-06-07 ENCOUNTER — Other Ambulatory Visit: Payer: Self-pay | Admitting: Hematology and Oncology

## 2019-06-07 DIAGNOSIS — C099 Malignant neoplasm of tonsil, unspecified: Secondary | ICD-10-CM

## 2019-06-07 MED FILL — oxyCODONE HCL 15 MG TABS: 15 | 22 days supply | Qty: 90 | Fill #0

## 2019-06-07 MED FILL — ENTRESTO 24 MG-26 MG TABLET: 24-26 | 30 days supply | Qty: 60 | Fill #2

## 2019-06-14 MED FILL — metroNIDAZOLE 500 MG TABS: 500 | 1 days supply | Qty: 4 | Fill #0

## 2019-06-20 MED FILL — hydrALAZINE HCL 25 MG TABS: 25 | 90 days supply | Qty: 270 | Fill #2

## 2019-06-25 ENCOUNTER — Other Ambulatory Visit: Payer: Self-pay | Admitting: Hematology and Oncology

## 2019-06-25 ENCOUNTER — Telehealth: Payer: Self-pay

## 2019-06-25 DIAGNOSIS — C099 Malignant neoplasm of tonsil, unspecified: Secondary | ICD-10-CM

## 2019-06-25 MED ORDER — OXYCODONE HCL 15 MG PO TABS: 15.0000 mg | ORAL_TABLET | Freq: Four times a day (QID) | ORAL | 0 refills | Status: DC | PRN

## 2019-06-25 MED FILL — METOPROLOL SUCCINATE ER 50: 50 | 90 days supply | Qty: 90 | Fill #1

## 2019-06-25 NOTE — Telephone Encounter (Signed)
He called and left a message to call him.  Called back. He is needing next appt times. Given appt date and times. He verbalized understanding. Requesting refill on Oxycodone.

## 2019-06-25 NOTE — Telephone Encounter (Signed)
done

## 2019-06-27 MED FILL — oxyCODONE HCL 15 MG TABS: 15 | 22 days supply | Qty: 90 | Fill #0

## 2019-06-27 MED FILL — METRONIDAZOLE 500 MG TABS: 500 | 1 days supply | Qty: 4 | Fill #0

## 2019-07-11 ENCOUNTER — Telehealth: Payer: Self-pay

## 2019-07-11 NOTE — Telephone Encounter (Signed)
Ok to proceed. 

## 2019-07-11 NOTE — Telephone Encounter (Signed)
Called and given below message. He verbalized understanding. 

## 2019-07-11 NOTE — Telephone Encounter (Signed)
He called and left a message. Is it okay to get the covid vaccine?

## 2019-07-16 ENCOUNTER — Other Ambulatory Visit: Payer: Self-pay

## 2019-07-16 ENCOUNTER — Inpatient Hospital Stay: Payer: Medicaid Other

## 2019-07-16 ENCOUNTER — Inpatient Hospital Stay: Payer: Medicaid Other | Attending: Hematology and Oncology | Admitting: Hematology and Oncology

## 2019-07-16 ENCOUNTER — Encounter: Payer: Self-pay | Admitting: Hematology and Oncology

## 2019-07-16 DIAGNOSIS — C099 Malignant neoplasm of tonsil, unspecified: Secondary | ICD-10-CM | POA: Diagnosis present

## 2019-07-16 DIAGNOSIS — G893 Neoplasm related pain (acute) (chronic): Secondary | ICD-10-CM | POA: Insufficient documentation

## 2019-07-16 DIAGNOSIS — Z79899 Other long term (current) drug therapy: Secondary | ICD-10-CM | POA: Diagnosis not present

## 2019-07-16 DIAGNOSIS — R112 Nausea with vomiting, unspecified: Secondary | ICD-10-CM

## 2019-07-16 DIAGNOSIS — G629 Polyneuropathy, unspecified: Secondary | ICD-10-CM | POA: Diagnosis not present

## 2019-07-16 DIAGNOSIS — E039 Hypothyroidism, unspecified: Secondary | ICD-10-CM | POA: Diagnosis not present

## 2019-07-16 DIAGNOSIS — Z95828 Presence of other vascular implants and grafts: Secondary | ICD-10-CM

## 2019-07-16 DIAGNOSIS — I2510 Atherosclerotic heart disease of native coronary artery without angina pectoris: Secondary | ICD-10-CM | POA: Diagnosis not present

## 2019-07-16 DIAGNOSIS — I700 Atherosclerosis of aorta: Secondary | ICD-10-CM | POA: Diagnosis not present

## 2019-07-16 DIAGNOSIS — N183 Chronic kidney disease, stage 3 unspecified: Secondary | ICD-10-CM | POA: Insufficient documentation

## 2019-07-16 DIAGNOSIS — Z5111 Encounter for antineoplastic chemotherapy: Secondary | ICD-10-CM

## 2019-07-16 DIAGNOSIS — C090 Malignant neoplasm of tonsillar fossa: Secondary | ICD-10-CM

## 2019-07-16 LAB — CBC WITH DIFFERENTIAL/PLATELET
Abs Immature Granulocytes: 0.01 10*3/uL (ref 0.00–0.07)
Basophils Absolute: 0 10*3/uL (ref 0.0–0.1)
Basophils Relative: 0 %
Eosinophils Absolute: 0.1 10*3/uL (ref 0.0–0.5)
Eosinophils Relative: 2 %
HCT: 43 % (ref 39.0–52.0)
Hemoglobin: 14.4 g/dL (ref 13.0–17.0)
Immature Granulocytes: 0 %
Lymphocytes Relative: 9 %
Lymphs Abs: 0.6 10*3/uL — ABNORMAL LOW (ref 0.7–4.0)
MCH: 34.3 pg — ABNORMAL HIGH (ref 26.0–34.0)
MCHC: 33.5 g/dL (ref 30.0–36.0)
MCV: 102.4 fL — ABNORMAL HIGH (ref 80.0–100.0)
Monocytes Absolute: 0.6 10*3/uL (ref 0.1–1.0)
Monocytes Relative: 10 %
Neutro Abs: 4.8 10*3/uL (ref 1.7–7.7)
Neutrophils Relative %: 79 %
Platelets: 195 10*3/uL (ref 150–400)
RBC: 4.2 MIL/uL — ABNORMAL LOW (ref 4.22–5.81)
RDW: 12.7 % (ref 11.5–15.5)
WBC: 6 10*3/uL (ref 4.0–10.5)
nRBC: 0 % (ref 0.0–0.2)

## 2019-07-16 LAB — CMP (CANCER CENTER ONLY)
ALT: 33 U/L (ref 0–44)
AST: 38 U/L (ref 15–41)
Albumin: 3.9 g/dL (ref 3.5–5.0)
Alkaline Phosphatase: 55 U/L (ref 38–126)
Anion gap: 10 (ref 5–15)
BUN: 17 mg/dL (ref 6–20)
CO2: 25 mmol/L (ref 22–32)
Calcium: 9.5 mg/dL (ref 8.9–10.3)
Chloride: 106 mmol/L (ref 98–111)
Creatinine: 1.68 mg/dL — ABNORMAL HIGH (ref 0.61–1.24)
GFR, Est AFR Am: 54 mL/min — ABNORMAL LOW (ref >60)
GFR, Estimated: 46 mL/min — ABNORMAL LOW (ref >60)
Glucose, Bld: 94 mg/dL (ref 70–99)
Potassium: 4.4 mmol/L (ref 3.5–5.1)
Sodium: 141 mmol/L (ref 135–145)
Total Bilirubin: 0.4 mg/dL (ref 0.3–1.2)
Total Protein: 7.2 g/dL (ref 6.5–8.1)

## 2019-07-16 LAB — TSH: TSH: 2.075 u[IU]/mL (ref 0.320–4.118)

## 2019-07-16 MED ORDER — HEPARIN SOD (PORK) LOCK FLUSH 100 UNIT/ML IV SOLN
500.0000 [IU] | Freq: Once | INTRAVENOUS | Status: AC | PRN
  Administered 2019-07-16: 500 [IU] via INTRAVENOUS
  Filled 2019-07-16: qty 5

## 2019-07-16 MED ORDER — OXYCODONE HCL 15 MG PO TABS: 15.0000 mg | ORAL_TABLET | Freq: Four times a day (QID) | ORAL | 0 refills | Status: DC | PRN

## 2019-07-16 MED ORDER — SODIUM CHLORIDE 0.9% FLUSH
10.0000 mL | Freq: Once | INTRAVENOUS | Status: AC
  Administered 2019-07-16: 10 mL
  Filled 2019-07-16: qty 10

## 2019-07-16 MED FILL — oxyCODONE HCL 15 MG TABS: 15 | 22 days supply | Qty: 90 | Fill #0

## 2019-07-16 NOTE — Assessment & Plan Note (Signed)
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  We discussed briefly about narcotic refill policy

## 2019-07-16 NOTE — Assessment & Plan Note (Signed)
His last Ct imaging showed no signs of cancer We will discontinue his treatment permanently due to significant immune mediated reactions which has since resolved I recommend maintaining port patency with port flushes every 8 weeks I will see him back again in 4 months for further follow-up I explained to the patient importance of staying abstinent from alcohol due to risk of recurrence of cancer with alcohol intake

## 2019-07-16 NOTE — Patient Instructions (Signed)

## 2019-07-16 NOTE — Assessment & Plan Note (Signed)
He has acquired hypothyroidism secondary to treatment We will continue to monitor his TSH and adjust medication prn 

## 2019-07-16 NOTE — Assessment & Plan Note (Signed)
He has stable chronic kidney disease since chemotherapy Observe closely 

## 2019-07-16 NOTE — Progress Notes (Signed)
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 We will discontinue his treatment permanently due to significant immune mediated reactions which has since resolved I recommend maintaining port patency with port flushes every 8 weeks I will see him back again in 4 months for further follow-up I explained to the patient importance of staying abstinent from alcohol due to risk of recurrence of cancer with alcohol intake  CKD (chronic kidney disease), stage III He has stable chronic kidney disease since chemotherapy Observe closely  Cancer associated pain He has multifactorial pain, 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  We discussed briefly about narcotic refill policy  Acquired hypothyroidism He has acquired hypothyroidism secondary to treatment We will continue to monitor his TSH and adjust medication prn   No orders of the defined types were placed in this encounter.   All questions were answered. The patient knows to call the clinic with any problems, questions or concerns. The total time spent in the appointment was 20 minutes encounter with patients including review of chart and various tests results, discussions about plan of care and coordination of care plan   [name], MD 07/16/2019 10:11 AM  INTERVAL HISTORY: Please see below for problem oriented charting. He returns for further follow-up He is compliant taking all his medications as prescribed No recent exacerbation of congestive  heart failure He has lost some weight due to to dietary changes to live healthier He did admit some alcohol intake but no smoking His chronic pain is stable  SUMMARY OF ONCOLOGIC HISTORY: Oncology History Overview Note  Tonsillar cancer, HPV positive   Primary site: Pharynx - Oropharynx (Right)   Staging method: AJCC 7th Edition   Clinical: Stage IVA (T2, N2b, M0) signed by [name], MD on 08/19/2013  1:24 PM   Summary: Stage IVA (T2, N2b, M0)     Tonsillar cancer ([place])  07/09/2013 Procedure   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 FNA of right level III lymph node was inconclusive for cancer   07/25/2013 Imaging   PET scan showed locally advanced disease with abnormal lymphadenopathy in the right axilla   08/06/2013 Procedure   CT-guided biopsy of the lymphadenopathy was negative for malignancy   08/15/2013 Surgery   Patient has placement of port and feeding tube   08/19/2013 - 09/10/2013 Chemotherapy   Patient received chemotherapy with cisplatin. The patient only received 2 doses due to uncontrolled nausea and acute renal failure.   08/19/2013 - 10/15/2013 Radiation Therapy   Received Helical IMRT Tomotherapy:  Right Tonstil and bilateral neck / 70 Gy in 35 fractions to gross disease, 63 Gy in 35 fractions to high risk nodal echelons, and 56 Gy in 35 fractions to intermediate risk nodal echelons.   08/27/2013 - 08/30/2013 Hospital Admission   The patient was admitted to the hospital for uncontrolled nausea vomiting and dehydration.   02/14/2014 Imaging   PET/CT scan showed complete response to treatment   03/19/2014 Surgery   He had excisional lymph node biopsy from the right neck. Pathology was benign  05/13/2014 Surgery   He had removal of Port-A-Cath.   05/22/2014 Imaging   Repeat CT scan of the neck show no evidence of disease recurrence.   12/09/2014 Imaging   Ct neck without contrast showed persistent abnormalities on the right  side of neck, indeterminate   12/25/2014 Imaging   PET CT scan showed disease recurrence.   02/10/2015 Procedure   He has placement of port   02/13/2015 - 07/13/2015 Chemotherapy   He received chemotherapy with carbo/Taxol   04/21/2015 Imaging   PET CT scan showed improved disease control   08/04/2015 Imaging   PET scan showed persistent disease   08/17/2015 -  Chemotherapy   He was started with [facility]   10/19/2015 Imaging   Ct neck showed mass-like intermediate density soft tissue at the right lateral neck recurrence site stable.    02/26/2016 Imaging   CT neck showed unchanged appearance of right neck recurrence compared to 10/19/2015 CT. No noncontrast evidence of new metastatic disease in the neck. Chronic sinusitis, progressed.   02/26/2016 Imaging   Diffuse but patchy and asymmetric partial airspace filling process (ground-glass opacity) in the lungs. This could be due to respiratory bronchiolitis, hypersensitivity pneumonitis or possible drug reaction. Atypical/viral pneumonia is also possible. Pulmonary consultation may be a helpful. A three-month follow-up noncontrast chest CT is suggested. Slight interval enlargement of mediastinal lymph nodes and a small lymph node along the left major fissure. This is most likely due to the inflammatory process in the lungs. No findings for metastatic disease involving the chest. No findings for upper abdominal metastatic disease.   02/29/2016 Adverse Reaction   His treatment is placed on hold due to possible hypersensitivity pneumonitis/drug reaction   04/14/2016 Imaging   Ct chest showed no evidence for metastatic disease within the chest. Significant interval improvement and near complete resolution of previously described diffuse bilateral predominately ground-glass pulmonary opacities, most compatible with resolving infectious/inflammatory process.   09/05/2016 Imaging   CT scan of neck and chest  1. Unchanged appearance of right neck  recurrence. 2. No evidence of new metastatic disease in the neck. 3. Unremarkable and stable CT appearance of the chest. No findings suspicious for metastatic disease   07/24/2017 Imaging   1. No findings suspicious for metastatic disease in the chest. 2. No acute consolidative airspace disease to suggest a pneumonia. 3. No appreciable change in chronic mild patchy upper lung predominant centrilobular micronodularity. If the patient is a current smoker, these findings are most compatible with smoking related interstitial lung disease. If the patient is not a current smoker, these findings suggest subacute hypersensitivity pneumonitis  or postinflammatory change. 4. Stable mild biapical radiation fibrosis. 5. Mild to moderate centrilobular emphysema with diffuse bronchial wall thickening, suggesting COPD. 6. One vessel coronary atherosclerosis.  Aortic Atherosclerosis (ICD10-I70.0) and Emphysema (ICD10-J43.9).   09/15/2017 Imaging   1. No evidence of interstitial lung disease. 2. Emphysema (ICD10-J43.9).   10/29/2017 Imaging   1. Moderate quality exam for pulmonary embolism with primary limitation of motion. No embolism identified. 2. Findings of congestive heart failure, including bilateral pleural effusions and septal thickening. 3. New thoracic adenopathy since approximately 6 weeks ago. Favor secondary to fluid overload/congestive heart failure. 4. New left apical 4 mm pulmonary nodule. Favored to represent a subpleural lymph node. Non-contrast chest CT can be considered in 12 months, given risk factors for primary bronchogenic carcinoma. This recommendation follows the consensus statement: Guidelines for Management of Incidental Pulmonary Nodules Detected on CT Images: From the [place]  2017; Radiology 2017; [id number].    10/29/2017 - 11/02/2017 Hospital Admission   He was admitted to the hospital due to shortness of breath and was found to have congestive heart failure.    10/30/2017 Imaging   Definity used; severe global reduction in LV systolic function; severe LVE; restrictive filling; mild MR; mild LAE; mild RVE with moderate RV dysfunction; mild TR with moderate pulmonary hypertension.   01/26/2018 Imaging   1. Regression of soft tissue in the postoperative right neck favoring scarring. No new or progressive finding to suggest recurrent disease. 2. Chest CT reported separately   01/26/2018 Imaging   1. No evidence of thoracic metastasis. 2. Port in the anterior chest wall with tip in distal    03/12/2018 Imaging   1. Trace, silent aspiration noted after a swallow consistent with aspiration of residual barium in the hypopharynx. 2. Retrograde reflux of contrast into the posterior nasopharynx with swallowing. 3. No gross esophageal abnormality 4. Initial difficulty swallowing a 13 mm barium tablet that than passes readily into the stomach once it is swallowed.   05/31/2018 Imaging   Resolution of previously seen mucosal hyperenhancement in the pharynx and larynx. Otherwise unchanged examination of the neck without evidence of recurrent disease or cervical nodal metastases.    06/07/2018 Echocardiogram   1. The left ventricle has moderately reduced systolic function, with an ejection fraction of 35-40%. The cavity size was severely dilated. Left ventricular diastolic Doppler parameters are consistent with impaired relaxation Left ventricular diffuse hypokinesis.  2. The right ventricle has normal systolic function. The cavity was moderately enlarged. There is no increase in right ventricular wall thickness.  3. The mitral valve is normal in structure.  4. The tricuspid valve is normal in structure.  5. The aortic valve is normal in structure.  6. The pulmonic valve was normal in structure.  7. The inferior vena cava was dilated in size with >50% respiratory variability.     REVIEW OF SYSTEMS:   Constitutional: Denies fevers, chills or abnormal  weight loss Eyes: Denies blurriness of vision Ears, nose, mouth, throat, and face: Denies mucositis or sore throat Respiratory: Denies cough, dyspnea or wheezes Cardiovascular: Denies palpitation, chest discomfort or lower extremity swelling Gastrointestinal:  Denies nausea, heartburn or change in bowel habits Skin: Denies abnormal skin rashes Lymphatics: Denies new lymphadenopathy or easy bruising Neurological:Denies numbness, tingling or new weaknesses Behavioral/Psych: Mood is stable, no new changes  All other systems were reviewed with the patient and are negative.  I have reviewed the past medical history, past surgical history, social history and family history with the patient and they are unchanged from previous note.  ALLERGIES:  is allergic to bee pollen and pollen extract.  MEDICATIONS:  Current Outpatient Medications  Medication Sig Dispense Refill  . albuterol (PROVENTIL HFA;VENTOLIN HFA) 108 (90 Base) MCG/ACT inhaler Inhale 2 puffs into the lungs every 6 (six) hours as needed for wheezing or shortness of breath. 1 Inhaler 6  . aspirin EC 81 MG tablet Take 81 mg by mouth daily.    . fluticasone (FLONASE) 50 MCG/ACT nasal spray Place 2 sprays into both nostrils daily. 16 g 2  . hydrALAZINE (APRESOLINE) 25 MG tablet Take 1 tablet (25 mg total) by mouth 3 (three) times daily for 20 days.    . isosorbide mononitrate (IMDUR) 30 MG 24 hr tablet Take 1 tablet (30 mg total) by mouth daily. 90 tablet 3  . levothyroxine (SYNTHROID) 50 MCG tablet Take 1 tablet (50 mcg total) by  mouth daily before breakfast. 90 tablet 1  . lidocaine-prilocaine (EMLA) cream Apply 1 application topically as needed (port). 30 g 11  . metoprolol succinate (TOPROL-XL) 50 MG 24 hr tablet Take 1 tablet (50 mg total) by mouth daily. Take with or immediately following a meal. 90 tablet 3  . oxyCODONE (ROXICODONE) 15 MG immediate release tablet Take 1 tablet (15 mg total) by mouth every 6 (six) hours as needed. 90  tablet 0  . sacubitril-valsartan (ENTRESTO) 24-26 MG Take 1 tablet by mouth 2 (two) times daily. 60 tablet 6   No current facility-administered medications for this visit.   Facility-Administered Medications Ordered in Other Visits  Medication Dose Route Frequency Provider Last Rate Last Admin  . sodium chloride 0.9 % injection 10 mL  10 mL Intravenous PRN Alvy Bimler, Shavonta Gossen, MD   10 mL at 11/29/16 0818  . sodium chloride 0.9 % injection 10 mL  10 mL Intravenous PRN Alvy Bimler, Jeffrie Lofstrom, MD        PHYSICAL EXAMINATION: ECOG PERFORMANCE STATUS: 1 - Symptomatic but completely ambulatory  Vitals:   07/16/19 0930  BP: 126/77  Pulse: 80  Resp: 18  Temp: 98.2 F (36.8 C)  SpO2: 99%   Filed Weights   07/16/19 0930  Weight: 240 lb 12.8 oz (109.2 kg)    GENERAL:alert, no distress and comfortable SKIN: skin color, texture, turgor are normal, no rashes or significant lesions EYES: normal, Conjunctiva are pink and non-injected, sclera clear OROPHARYNX:no exudate, no erythema and lips, buccal mucosa, and tongue normal  NECK: Noted well-healed surgical scar LYMPH:  no palpable lymphadenopathy in the cervical, axillary or inguinal LUNGS: clear to auscultation and percussion with normal breathing effort HEART: regular rate & rhythm and no murmurs and no lower extremity edema ABDOMEN:abdomen soft, non-tender and normal bowel sounds Musculoskeletal:no cyanosis of digits and no clubbing  NEURO: alert & oriented x 3 with fluent speech, no focal motor/sensory deficits  LABORATORY DATA:  I have reviewed the data as listed    Component Value Date/Time   NA 141 07/16/2019 0856   NA 141 04/20/2017 1028   K 4.4 07/16/2019 0856   K 4.3 04/20/2017 1028   CL 106 07/16/2019 0856   CO2 25 07/16/2019 0856   CO2 26 04/20/2017 1028   GLUCOSE 94 07/16/2019 0856   GLUCOSE 96 04/20/2017 1028   BUN 17 07/16/2019 0856   BUN 16.7 04/20/2017 1028   CREATININE 1.68 (H) 07/16/2019 0856   CREATININE 2.0 (H) 04/20/2017  1028   CALCIUM 9.5 07/16/2019 0856   CALCIUM 9.0 04/20/2017 1028   PROT 7.2 07/16/2019 0856   PROT 7.0 04/20/2017 1028   ALBUMIN 3.9 07/16/2019 0856   ALBUMIN 4.0 04/20/2017 1028   AST 38 07/16/2019 0856   AST 20 04/20/2017 1028   ALT 33 07/16/2019 0856   ALT 17 04/20/2017 1028   ALKPHOS 55 07/16/2019 0856   ALKPHOS 61 04/20/2017 1028   BILITOT 0.4 07/16/2019 0856   BILITOT 0.49 04/20/2017 1028   GFRNONAA 46 (L) 07/16/2019 0856   GFRAA 54 (L) 07/16/2019 0856    No results found for: SPEP, UPEP  Lab Results  Component Value Date   WBC 6.0 07/16/2019   NEUTROABS 4.8 07/16/2019   HGB 14.4 07/16/2019   HCT 43.0 07/16/2019   MCV 102.4 (H) 07/16/2019   PLT 195 07/16/2019      Chemistry      Component Value Date/Time   NA 141 07/16/2019 0856   NA 141 04/20/2017 1028  K 4.4 07/16/2019 0856   K 4.3 04/20/2017 1028   CL 106 07/16/2019 0856   CO2 25 07/16/2019 0856   CO2 26 04/20/2017 1028   BUN 17 07/16/2019 0856   BUN 16.7 04/20/2017 1028   CREATININE 1.68 (H) 07/16/2019 0856   CREATININE 2.0 (H) 04/20/2017 1028      Component Value Date/Time   CALCIUM 9.5 07/16/2019 0856   CALCIUM 9.0 04/20/2017 1028   ALKPHOS 55 07/16/2019 0856   ALKPHOS 61 04/20/2017 1028   AST 38 07/16/2019 0856   AST 20 04/20/2017 1028   ALT 33 07/16/2019 0856   ALT 17 04/20/2017 1028   BILITOT 0.4 07/16/2019 0856   BILITOT 0.49 04/20/2017 1028

## 2019-07-17 ENCOUNTER — Telehealth: Payer: Self-pay | Admitting: Hematology and Oncology

## 2019-07-17 NOTE — Telephone Encounter (Signed)
Scheduled per 3/30 sch msg. Called and spoke with pt, confirmed 5/11 and 7/6 appts

## 2019-07-23 IMAGING — CT CT CHEST W/O CM
2 of 4 series · 14 of 36 positions shown, 17 images · non-contrast
Comparison: 07/13/2017 chest radiograph.  09/05/2016 chest CT

CLINICAL DATA: Dyspnea. Rales on exam. Right tonsillar squamous
cell carcinoma on chemotherapy.

EXAM:
CT CHEST WITHOUT CONTRAST
TECHNIQUE: Multidetector CT imaging of the chest was performed following the
standard protocol without IV contrast.

[Series 2: thorax · axial · 0.85mm/px · z∈[-127,+159]mm · 11 of 167 slices shown, 14 images]
[im 12/167  mediastinal]
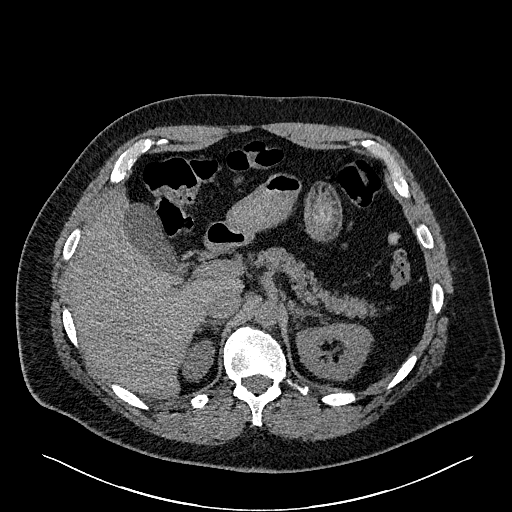
[im 12/167  lung]
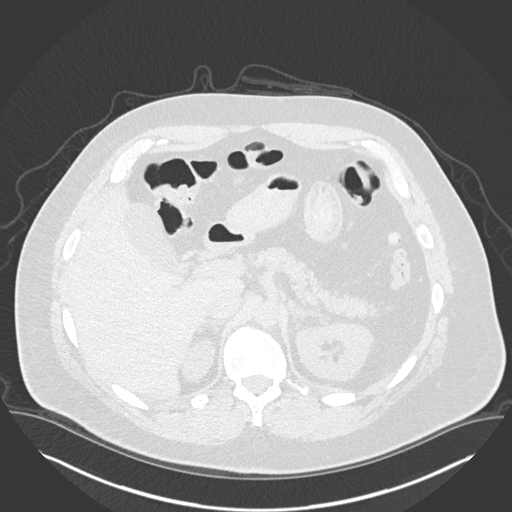
[im 24/167  lung]
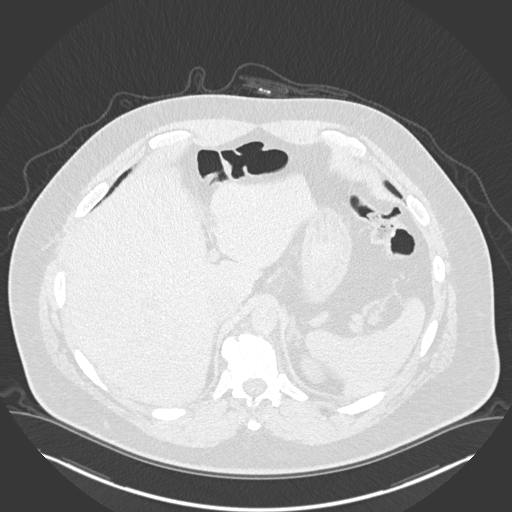
[im 36/167  lung]
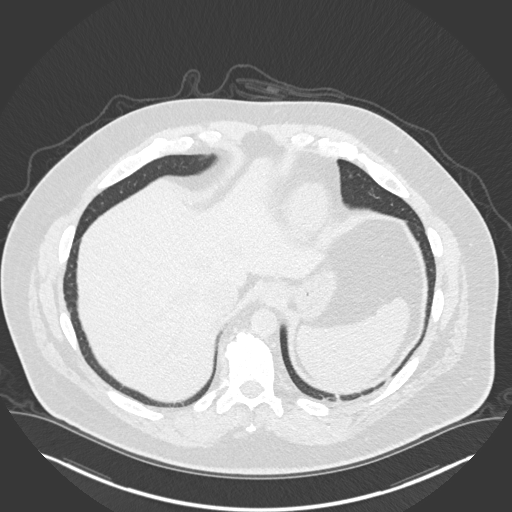
[im 60/167  lung]
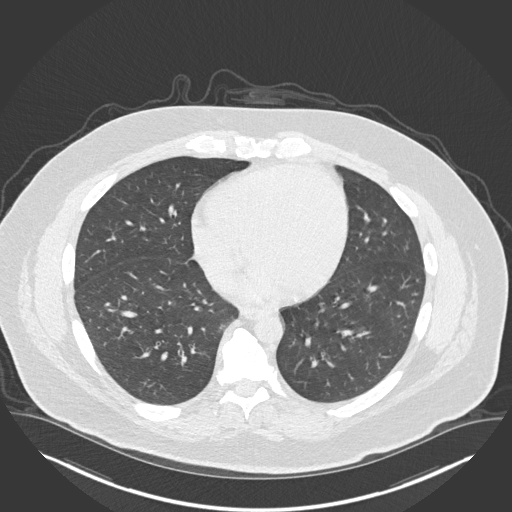
[im 72/167  mediastinal]
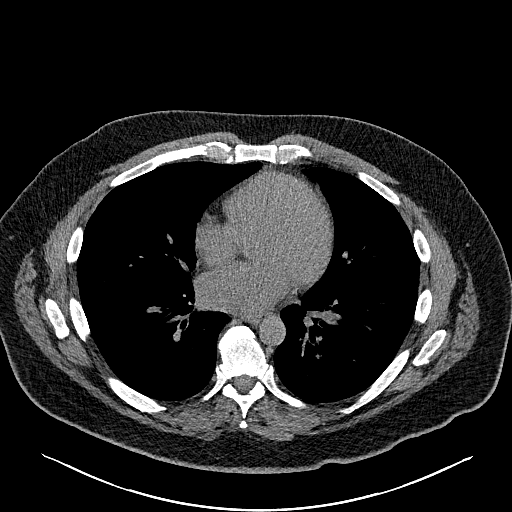
[im 72/167  lung]
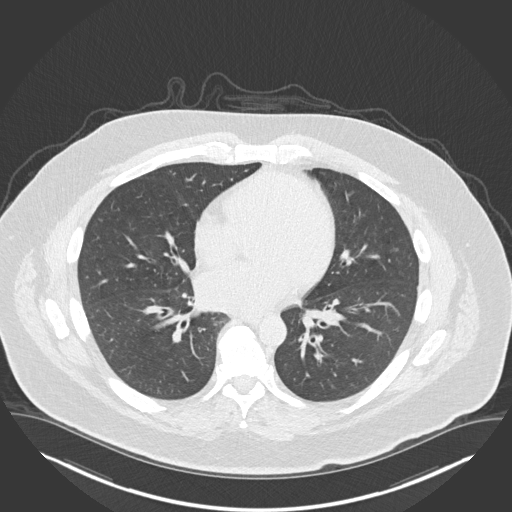
[im 84/167  lung]
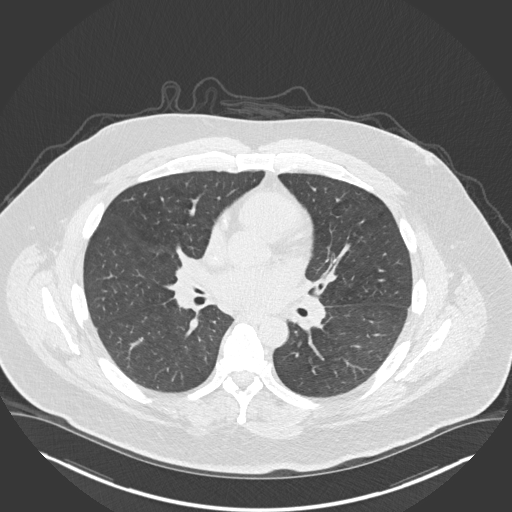
[im 95/167  lung]
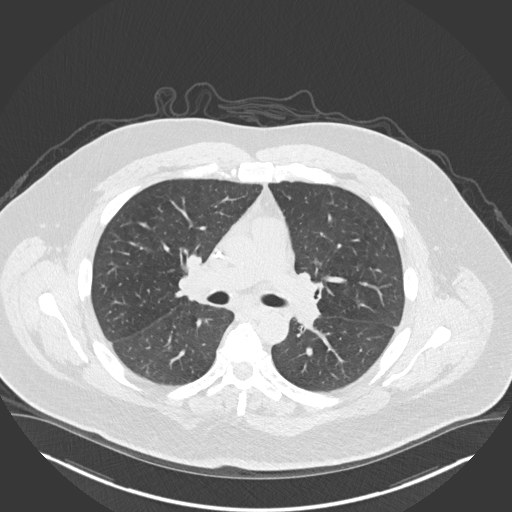
[im 107/167  lung]
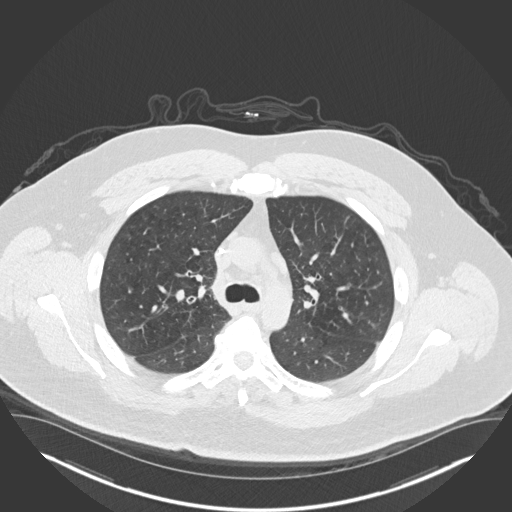
[im 131/167  mediastinal]
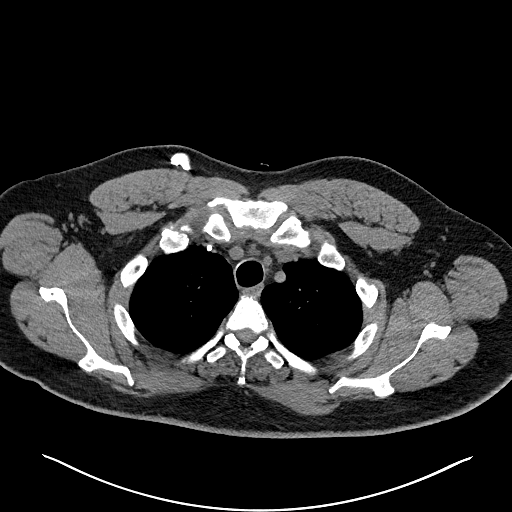
[im 131/167  lung]
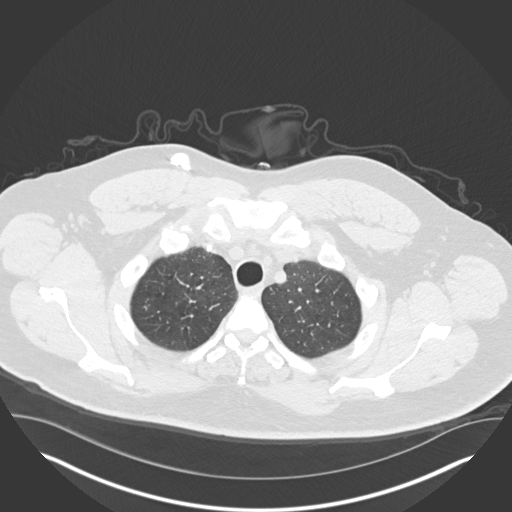
[im 143/167  lung]
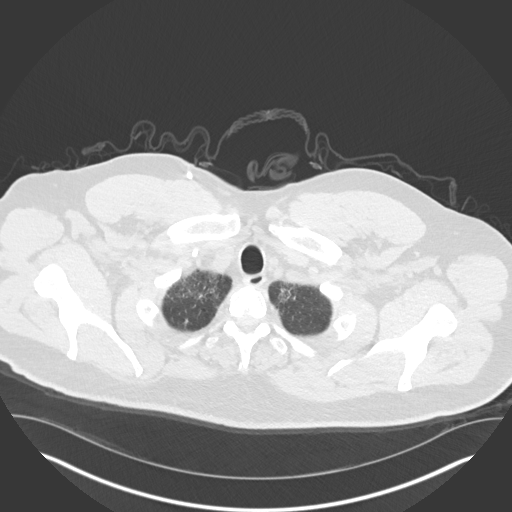
[im 155/167  lung]
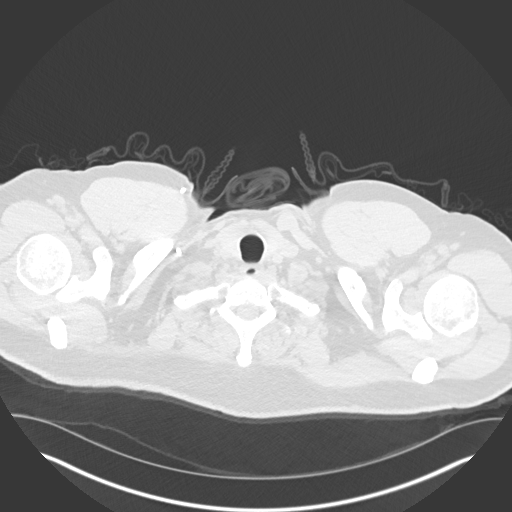

[Series 5: coronal · coronal · 0.71mm/px · 3 of 138 slices shown]
[im 28/138  lung]
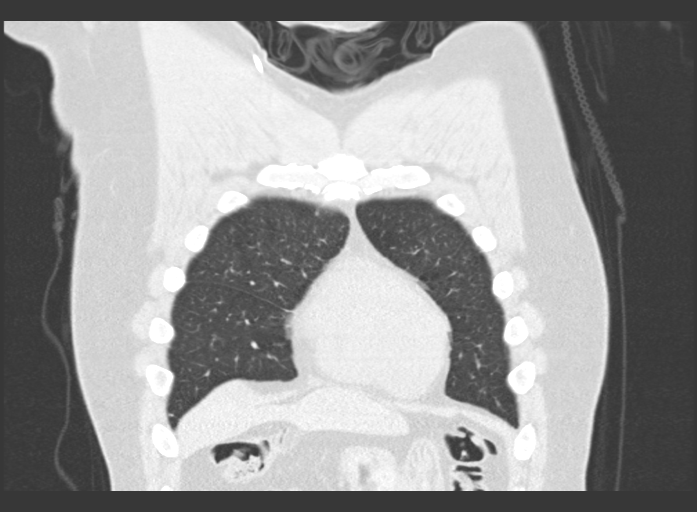
[im 55/138  lung]
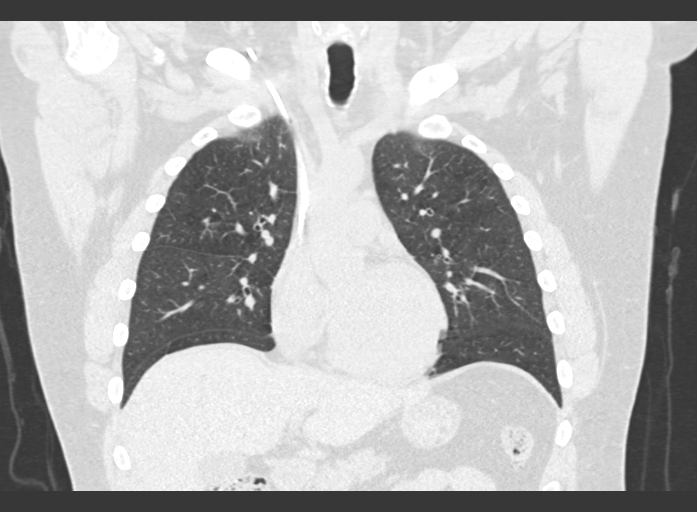
[im 83/138  lung]
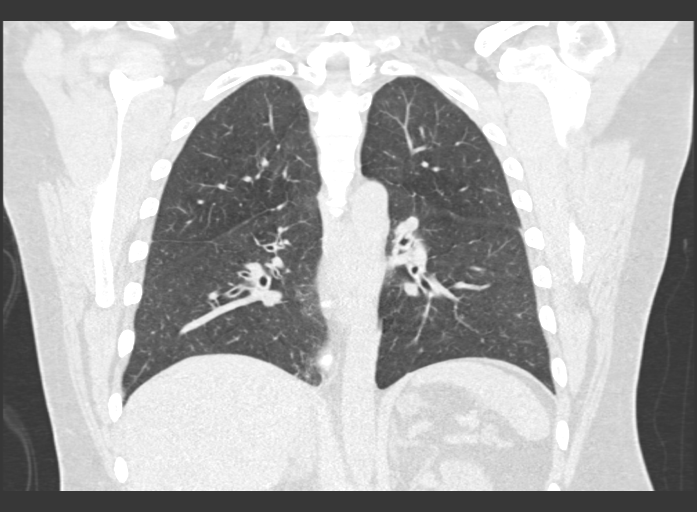

[14 of 36 positions shown; findings below may reference images not displayed]

FINDINGS: Cardiovascular: Normal heart size. No significant pericardial
fluid/thickening. Right internal jugular MediPort terminates at the
cavoatrial junction. Right coronary atherosclerosis. Great vessels
are normal in course and caliber.

Mediastinum/Nodes: No discrete thyroid nodules. Unremarkable
esophagus. No pathologically enlarged axillary, mediastinal or gross
hilar lymph nodes, noting limited sensitivity for the detection of
hilar adenopathy on this noncontrast study.

Lungs/Pleura: No pneumothorax. No pleural effusion. Mild-to-moderate
centrilobular emphysema with diffuse bronchial wall thickening. No
acute consolidative airspace disease or lung masses. There is mild
patchy centrilobular predominantly ground-glass micronodularity in
both lungs, upper lobe predominant, with a dominant 3 mm
centrilobular right upper lobe nodule (series 7/image 42), unchanged
since 09/05/2016 chest CT. No new significant pulmonary nodules.
Stable sharply marginated mild ground-glass opacity and reticulation
at the lung apices compatible with mild radiation fibrosis (series
5/image 74).

Upper abdomen: Colonic diverticulosis.

Musculoskeletal: No aggressive appearing focal osseous lesions. Mild
gynecomastia slightly asymmetric to the left, unchanged. Marked
thoracic spondylosis.
IMPRESSION: 1. No findings suspicious for metastatic disease in the chest.
2. No acute consolidative airspace disease to suggest a pneumonia.
3. No appreciable change in chronic mild patchy upper lung
predominant centrilobular micronodularity. If the patient is a
current smoker, these findings are most compatible with smoking
related interstitial lung disease. If the patient is not a current
smoker, these findings suggest subacute hypersensitivity pneumonitis
or postinflammatory change.
4. Stable mild biapical radiation fibrosis.
5. Mild to moderate centrilobular emphysema with diffuse bronchial
wall thickening, suggesting COPD.
6. One vessel coronary atherosclerosis.

Aortic Atherosclerosis (55LLF-AFS.S) and Emphysema (55LLF-QGV.2).

## 2019-07-24 MED FILL — ENTRESTO 24 MG-26 MG TABLET: 24-26 | 30 days supply | Qty: 60 | Fill #3

## 2019-07-25 ENCOUNTER — Ambulatory Visit: Payer: Medicaid Other | Attending: Internal Medicine

## 2019-07-25 DIAGNOSIS — Z23 Encounter for immunization: Secondary | ICD-10-CM

## 2019-07-25 NOTE — Progress Notes (Signed)
   Covid-19 Vaccination Clinic  Name:  Mason [name]    MRN: [id number] DOB: [birth year]  07/25/2019  [name] was observed post Covid-19 immunization for 30 minutes based on pre-vaccination screening without incident. He was provided with Vaccine Information Sheet and instruction to access the V-Safe system.   [name] was instructed to call 911 with any severe reactions post vaccine: Marland Kitchen Difficulty breathing  . Swelling of face and throat  . A fast heartbeat  . A bad rash all over body  . Dizziness and weakness   Immunizations Administered    Name Date Dose VIS Date Route   Pfizer COVID-19 Vaccine 07/25/2019  9:12 AM 0.3 mL 03/29/2019 Intramuscular   Manufacturer: [employer]   Lot: [id number]   [place]: [id number]

## 2019-08-02 ENCOUNTER — Telehealth: Payer: Self-pay

## 2019-08-02 NOTE — Telephone Encounter (Signed)
Returned call to patient. Requesting refill on Oxycodone Rx. Told him that it is too early to refill today. Dr. Alvy Bimler will send refill Monday 4/19. Reviewed upcoming appts. He verbalized understanding.

## 2019-08-05 ENCOUNTER — Other Ambulatory Visit: Payer: Self-pay | Admitting: Hematology and Oncology

## 2019-08-05 DIAGNOSIS — C099 Malignant neoplasm of tonsil, unspecified: Secondary | ICD-10-CM

## 2019-08-05 MED ORDER — OXYCODONE HCL 15 MG PO TABS: 15.0000 mg | ORAL_TABLET | Freq: Four times a day (QID) | ORAL | 0 refills | Status: DC | PRN

## 2019-08-05 MED FILL — oxyCODONE HCL 15 MG TABS: 15 | 23 days supply | Qty: 90 | Fill #0

## 2019-08-19 ENCOUNTER — Ambulatory Visit: Payer: Medicaid Other | Attending: Internal Medicine

## 2019-08-19 DIAGNOSIS — Z23 Encounter for immunization: Secondary | ICD-10-CM

## 2019-08-19 MED FILL — LEVOTHYROXINE SODIUM 50 MCG: 50 | 90 days supply | Qty: 90 | Fill #0

## 2019-08-19 NOTE — Progress Notes (Signed)
   Covid-19 Vaccination Clinic  Name:  Mason [name]    MRN: [id number] DOB: 11/17/1965  08/19/2019  Mason [name] was observed post Covid-19 immunization for 15 minutes without incident. He was provided with Vaccine Information Sheet and instruction to access the V-Safe system.   Mason [name] was instructed to call 911 with any severe reactions post vaccine: Marland Kitchen Difficulty breathing  . Swelling of face and throat  . A fast heartbeat  . A bad rash all over body  . Dizziness and weakness   Immunizations Administered    Name Date Dose VIS Date Route   Pfizer COVID-19 Vaccine 08/19/2019  9:14 AM 0.3 mL 06/12/2018 Intramuscular   Manufacturer: [place]   Lot: [id number]   [place]: [id number]

## 2019-08-20 ENCOUNTER — Other Ambulatory Visit (HOSPITAL_COMMUNITY): Payer: Self-pay | Admitting: Hematology and Oncology

## 2019-08-26 ENCOUNTER — Other Ambulatory Visit: Payer: Self-pay | Admitting: Hematology and Oncology

## 2019-08-26 ENCOUNTER — Telehealth: Payer: Self-pay

## 2019-08-26 DIAGNOSIS — C099 Malignant neoplasm of tonsil, unspecified: Secondary | ICD-10-CM

## 2019-08-26 MED ORDER — OXYCODONE HCL 15 MG PO TABS: 15.0000 mg | ORAL_TABLET | Freq: Four times a day (QID) | ORAL | 0 refills | Status: DC | PRN

## 2019-08-26 NOTE — Telephone Encounter (Signed)
done

## 2019-08-26 NOTE — Telephone Encounter (Signed)
He called and left a message asking for a call back.  Called back. He needs a refill on his Oxycodone Rx. Ask that it be sent to [facility] outpatient pharmacy.

## 2019-08-27 ENCOUNTER — Inpatient Hospital Stay: Payer: Medicaid Other

## 2019-08-30 ENCOUNTER — Inpatient Hospital Stay: Payer: Medicaid Other | Attending: Hematology and Oncology

## 2019-09-17 ENCOUNTER — Other Ambulatory Visit: Payer: Self-pay | Admitting: Hematology and Oncology

## 2019-09-17 ENCOUNTER — Inpatient Hospital Stay: Payer: Medicaid Other | Attending: Hematology and Oncology

## 2019-09-17 ENCOUNTER — Telehealth: Payer: Self-pay

## 2019-09-17 ENCOUNTER — Other Ambulatory Visit: Payer: Self-pay

## 2019-09-17 DIAGNOSIS — G893 Neoplasm related pain (acute) (chronic): Secondary | ICD-10-CM | POA: Insufficient documentation

## 2019-09-17 DIAGNOSIS — C099 Malignant neoplasm of tonsil, unspecified: Secondary | ICD-10-CM

## 2019-09-17 DIAGNOSIS — E039 Hypothyroidism, unspecified: Secondary | ICD-10-CM | POA: Diagnosis not present

## 2019-09-17 DIAGNOSIS — R112 Nausea with vomiting, unspecified: Secondary | ICD-10-CM

## 2019-09-17 DIAGNOSIS — Z452 Encounter for adjustment and management of vascular access device: Secondary | ICD-10-CM | POA: Insufficient documentation

## 2019-09-17 DIAGNOSIS — N183 Chronic kidney disease, stage 3 unspecified: Secondary | ICD-10-CM | POA: Diagnosis not present

## 2019-09-17 DIAGNOSIS — Z95828 Presence of other vascular implants and grafts: Secondary | ICD-10-CM

## 2019-09-17 MED ORDER — OXYCODONE HCL 15 MG PO TABS: 15.0000 mg | ORAL_TABLET | Freq: Four times a day (QID) | ORAL | 0 refills | Status: DC | PRN

## 2019-09-17 MED ORDER — HEPARIN SOD (PORK) LOCK FLUSH 100 UNIT/ML IV SOLN
500.0000 [IU] | Freq: Once | INTRAVENOUS | Status: AC | PRN
  Administered 2019-09-17: 500 [IU] via INTRAVENOUS
  Filled 2019-09-17: qty 5

## 2019-09-17 MED ORDER — SODIUM CHLORIDE 0.9% FLUSH
10.0000 mL | Freq: Once | INTRAVENOUS | Status: AC
  Administered 2019-09-17: 10 mL
  Filled 2019-09-17: qty 10

## 2019-09-17 MED FILL — oxyCODONE HCL 15 MG TABS: 15 | 22 days supply | Qty: 90 | Fill #0

## 2019-09-17 NOTE — Telephone Encounter (Signed)
-----   Message from [name], MD sent at 09/17/2019  9:24 AM EDT ----- Regarding: RE: Oxycodone done ----- Message ----- From: Lennie Odor, LPN Sent: [id number]   9:20 AM EDT To: Merril Abbe, LPN, [name], MD Subject: Oxycodone                                      Pt is coming in for flush at 1130 today. He left VM requesting a refill on Oxycodone to be sent in before his appt so that when he goes to [address] it will be ready.

## 2019-09-17 NOTE — Telephone Encounter (Signed)
Called pt to inform him that his appt for flush is at 1130 and Dr Alvy Bimler has called in Oxycodone. Pt verbalized thanks and understanding, and asks if he can have a note excusing his absence from work as it would be difficult for him to get all the way to [facility] after his appt.

## 2019-09-17 NOTE — Telephone Encounter (Signed)
Pt called and left VM requesting refill for Oxycodone before flush visit today. Sent message to Dr Alvy Bimler.

## 2019-10-02 MED FILL — ENTRESTO 24 MG-26 MG TABLET: 24-26 | 30 days supply | Qty: 60 | Fill #4

## 2019-10-07 ENCOUNTER — Other Ambulatory Visit: Payer: Self-pay | Admitting: Hematology and Oncology

## 2019-10-07 DIAGNOSIS — C099 Malignant neoplasm of tonsil, unspecified: Secondary | ICD-10-CM

## 2019-10-08 MED FILL — oxyCODONE HCL 15 MG TABS: 15 | 23 days supply | Qty: 90 | Fill #0

## 2019-10-14 MED FILL — METOPROLOL SUCCINATE ER 50: 50 | 90 days supply | Qty: 90 | Fill #2

## 2019-10-22 ENCOUNTER — Inpatient Hospital Stay: Payer: Medicaid Other

## 2019-10-22 ENCOUNTER — Telehealth: Payer: Self-pay

## 2019-10-22 ENCOUNTER — Inpatient Hospital Stay: Payer: Medicaid Other | Attending: Hematology and Oncology | Admitting: Hematology and Oncology

## 2019-10-22 ENCOUNTER — Other Ambulatory Visit: Payer: Self-pay

## 2019-10-22 ENCOUNTER — Encounter: Payer: Self-pay | Admitting: Hematology and Oncology

## 2019-10-22 DIAGNOSIS — G8929 Other chronic pain: Secondary | ICD-10-CM | POA: Insufficient documentation

## 2019-10-22 DIAGNOSIS — E039 Hypothyroidism, unspecified: Secondary | ICD-10-CM | POA: Diagnosis not present

## 2019-10-22 DIAGNOSIS — I509 Heart failure, unspecified: Secondary | ICD-10-CM | POA: Diagnosis not present

## 2019-10-22 DIAGNOSIS — I700 Atherosclerosis of aorta: Secondary | ICD-10-CM | POA: Insufficient documentation

## 2019-10-22 DIAGNOSIS — Z79899 Other long term (current) drug therapy: Secondary | ICD-10-CM | POA: Diagnosis not present

## 2019-10-22 DIAGNOSIS — Z5111 Encounter for antineoplastic chemotherapy: Secondary | ICD-10-CM

## 2019-10-22 DIAGNOSIS — Z95828 Presence of other vascular implants and grafts: Secondary | ICD-10-CM

## 2019-10-22 DIAGNOSIS — M542 Cervicalgia: Secondary | ICD-10-CM | POA: Diagnosis not present

## 2019-10-22 DIAGNOSIS — C090 Malignant neoplasm of tonsillar fossa: Secondary | ICD-10-CM

## 2019-10-22 DIAGNOSIS — G629 Polyneuropathy, unspecified: Secondary | ICD-10-CM | POA: Insufficient documentation

## 2019-10-22 DIAGNOSIS — C099 Malignant neoplasm of tonsil, unspecified: Secondary | ICD-10-CM

## 2019-10-22 DIAGNOSIS — N183 Chronic kidney disease, stage 3 unspecified: Secondary | ICD-10-CM | POA: Diagnosis not present

## 2019-10-22 DIAGNOSIS — I2510 Atherosclerotic heart disease of native coronary artery without angina pectoris: Secondary | ICD-10-CM | POA: Diagnosis not present

## 2019-10-22 DIAGNOSIS — R634 Abnormal weight loss: Secondary | ICD-10-CM | POA: Diagnosis not present

## 2019-10-22 DIAGNOSIS — G893 Neoplasm related pain (acute) (chronic): Secondary | ICD-10-CM

## 2019-10-22 DIAGNOSIS — I5022 Chronic systolic (congestive) heart failure: Secondary | ICD-10-CM | POA: Diagnosis not present

## 2019-10-22 DIAGNOSIS — R112 Nausea with vomiting, unspecified: Secondary | ICD-10-CM

## 2019-10-22 LAB — CMP (CANCER CENTER ONLY)
ALT: 84 U/L — ABNORMAL HIGH (ref 0–44)
AST: 69 U/L — ABNORMAL HIGH (ref 15–41)
Albumin: 3.9 g/dL (ref 3.5–5.0)
Alkaline Phosphatase: 58 U/L (ref 38–126)
Anion gap: 8 (ref 5–15)
BUN: 14 mg/dL (ref 6–20)
CO2: 25 mmol/L (ref 22–32)
Calcium: 8.8 mg/dL — ABNORMAL LOW (ref 8.9–10.3)
Chloride: 106 mmol/L (ref 98–111)
Creatinine: 1.75 mg/dL — ABNORMAL HIGH (ref 0.61–1.24)
GFR, Est AFR Am: 51 mL/min — ABNORMAL LOW (ref >60)
GFR, Estimated: 44 mL/min — ABNORMAL LOW (ref >60)
Glucose, Bld: 109 mg/dL — ABNORMAL HIGH (ref 70–99)
Potassium: 4.1 mmol/L (ref 3.5–5.1)
Sodium: 139 mmol/L (ref 135–145)
Total Bilirubin: 0.5 mg/dL (ref 0.3–1.2)
Total Protein: 7 g/dL (ref 6.5–8.1)

## 2019-10-22 LAB — CBC WITH DIFFERENTIAL/PLATELET
Abs Immature Granulocytes: 0.03 10*3/uL (ref 0.00–0.07)
Basophils Absolute: 0 10*3/uL (ref 0.0–0.1)
Basophils Relative: 0 %
Eosinophils Absolute: 0 10*3/uL (ref 0.0–0.5)
Eosinophils Relative: 1 %
HCT: 37.9 % — ABNORMAL LOW (ref 39.0–52.0)
Hemoglobin: 12.8 g/dL — ABNORMAL LOW (ref 13.0–17.0)
Immature Granulocytes: 1 %
Lymphocytes Relative: 10 %
Lymphs Abs: 0.5 10*3/uL — ABNORMAL LOW (ref 0.7–4.0)
MCH: 34.5 pg — ABNORMAL HIGH (ref 26.0–34.0)
MCHC: 33.8 g/dL (ref 30.0–36.0)
MCV: 102.2 fL — ABNORMAL HIGH (ref 80.0–100.0)
Monocytes Absolute: 0.6 10*3/uL (ref 0.1–1.0)
Monocytes Relative: 12 %
Neutro Abs: 3.7 10*3/uL (ref 1.7–7.7)
Neutrophils Relative %: 76 %
Platelets: 191 10*3/uL (ref 150–400)
RBC: 3.71 MIL/uL — ABNORMAL LOW (ref 4.22–5.81)
RDW: 13.2 % (ref 11.5–15.5)
WBC: 4.9 10*3/uL (ref 4.0–10.5)
nRBC: 0 % (ref 0.0–0.2)

## 2019-10-22 LAB — TSH: TSH: 1.981 u[IU]/mL (ref 0.320–4.118)

## 2019-10-22 MED ORDER — HEPARIN SOD (PORK) LOCK FLUSH 100 UNIT/ML IV SOLN
500.0000 [IU] | Freq: Once | INTRAVENOUS | Status: AC | PRN
  Administered 2019-10-22: 500 [IU] via INTRAVENOUS
  Filled 2019-10-22: qty 5

## 2019-10-22 MED ORDER — SODIUM CHLORIDE 0.9% FLUSH
10.0000 mL | Freq: Once | INTRAVENOUS | Status: AC
  Administered 2019-10-22: 10 mL
  Filled 2019-10-22: qty 10

## 2019-10-22 NOTE — Assessment & Plan Note (Signed)
He has stable chronic kidney disease since chemotherapy Observe closely 

## 2019-10-22 NOTE — Assessment & Plan Note (Signed)
He has no signs or symptoms of congestive heart failure He will continue medical management

## 2019-10-22 NOTE — Progress Notes (Signed)
[place] OFFICE PROGRESS NOTE  Patient Care Team: [name], FNP as PCP - General (Family Medicine) Fay Records, MD as PCP - Cardiology (Cardiology) Patient, [name] (General Practice) [name], MD as Referring Physician (Otolaryngology) [name], MD as Consulting Physician (Hematology and Oncology) Patient, [name] (General Practice)  ASSESSMENT & PLAN:  Tonsillar cancer His last Ct imaging showed no signs of cancer We will discontinue his treatment permanently due to significant immune mediated reactions which has since resolved I recommend maintaining port patency with port flushes every 8 weeks I explained to the patient importance of staying abstinent from alcohol due to risk of recurrence of cancer with alcohol intake  Acquired hypothyroidism He has acquired hypothyroidism secondary to treatment We will continue to monitor his TSH and adjust medication prn  CHF (congestive heart failure) ([place]) He has no signs or symptoms of congestive heart failure He will continue medical management  Cancer associated pain He has multifactorial pain, 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  We discussed briefly about narcotic refill policy  CKD (chronic kidney disease), stage III He has stable chronic kidney disease since chemotherapy Observe closely   No orders of the defined types were placed in this encounter.   All questions were answered. The patient knows to call the clinic with any problems, questions or concerns. The total time spent in the appointment was 20 minutes encounter with patients including review of chart and various tests results, discussions about plan of care and coordination of care plan   [name], MD 10/22/2019 12:00 PM  INTERVAL HISTORY: Please see below for problem oriented charting. He returns for further follow-up for history of  tonsil cancer He continues to drink 1 beer periodically He is separated from his ex-wife approximately 10 months ago He is busy working, [employer] He has lost some weight due to unpredictable eating schedule Otherwise, he feels healthy His chronic neck pain is stable He has no recent signs or symptoms of congestive heart failure  SUMMARY OF ONCOLOGIC HISTORY: Oncology History Overview Note  Tonsillar cancer, HPV positive   Primary site: Pharynx - Oropharynx (Right)   Staging method: AJCC 7th Edition   Clinical: Stage IVA (T2, N2b, M0) signed by [name], MD on 08/19/2013  1:24 PM   Summary: Stage IVA (T2, N2b, M0)     Tonsillar cancer ([place])  07/09/2013 Procedure   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 FNA of right level III lymph node was inconclusive for cancer   07/25/2013 Imaging   PET scan showed locally advanced disease with abnormal lymphadenopathy in the right axilla   08/06/2013 Procedure   CT-guided biopsy of the lymphadenopathy was negative for malignancy   08/15/2013 Surgery   Patient has placement of port and feeding tube   08/19/2013 - 09/10/2013 Chemotherapy   Patient received chemotherapy with cisplatin. The patient only received 2 doses due to uncontrolled nausea and acute renal failure.   08/19/2013 - 10/15/2013 Radiation Therapy   Received Helical IMRT Tomotherapy:  Right Tonstil and bilateral neck / 70 Gy in 35 fractions to gross disease, 63 Gy in 35 fractions to high risk nodal echelons, and 56 Gy in 35 fractions to intermediate risk nodal echelons.   08/27/2013 - 08/30/2013 Hospital Admission   The patient was admitted to the hospital for uncontrolled nausea vomiting and dehydration.   02/14/2014 Imaging   PET/CT scan showed  complete response to treatment   03/19/2014 Surgery   He had excisional lymph node biopsy from the right neck. Pathology was benign   05/13/2014 Surgery   He had removal of Port-A-Cath.    05/22/2014 Imaging   Repeat CT scan of the neck show no evidence of disease recurrence.   12/09/2014 Imaging   Ct neck without contrast showed persistent abnormalities on the right side of neck, indeterminate   12/25/2014 Imaging   PET CT scan showed disease recurrence.   02/10/2015 Procedure   He has placement of port   02/13/2015 - 07/13/2015 Chemotherapy   He received chemotherapy with carbo/Taxol   04/21/2015 Imaging   PET CT scan showed improved disease control   08/04/2015 Imaging   PET scan showed persistent disease   08/17/2015 -  Chemotherapy   He was started with [facility]   10/19/2015 Imaging   Ct neck showed mass-like intermediate density soft tissue at the right lateral neck recurrence site stable.    02/26/2016 Imaging   CT neck showed unchanged appearance of right neck recurrence compared to 10/19/2015 CT. No noncontrast evidence of new metastatic disease in the neck. Chronic sinusitis, progressed.   02/26/2016 Imaging   Diffuse but patchy and asymmetric partial airspace filling process (ground-glass opacity) in the lungs. This could be due to respiratory bronchiolitis, hypersensitivity pneumonitis or possible drug reaction. Atypical/viral pneumonia is also possible. Pulmonary consultation may be a helpful. A three-month follow-up noncontrast chest CT is suggested. Slight interval enlargement of mediastinal lymph nodes and a small lymph node along the left major fissure. This is most likely due to the inflammatory process in the lungs. No findings for metastatic disease involving the chest. No findings for upper abdominal metastatic disease.   02/29/2016 Adverse Reaction   His treatment is placed on hold due to possible hypersensitivity pneumonitis/drug reaction   04/14/2016 Imaging   Ct chest showed no evidence for metastatic disease within the chest. Significant interval improvement and near complete resolution of previously described diffuse bilateral predominately  ground-glass pulmonary opacities, most compatible with resolving infectious/inflammatory process.   09/05/2016 Imaging   CT scan of neck and chest  1. Unchanged appearance of right neck recurrence. 2. No evidence of new metastatic disease in the neck. 3. Unremarkable and stable CT appearance of the chest. No findings suspicious for metastatic disease   07/24/2017 Imaging   1. No findings suspicious for metastatic disease in the chest. 2. No acute consolidative airspace disease to suggest a pneumonia. 3. No appreciable change in chronic mild patchy upper lung predominant centrilobular micronodularity. If the patient is a current smoker, these findings are most compatible with smoking related interstitial lung disease. If the patient is not a current smoker, these findings suggest subacute hypersensitivity pneumonitis  or postinflammatory change. 4. Stable mild biapical radiation fibrosis. 5. Mild to moderate centrilobular emphysema with diffuse bronchial wall thickening, suggesting COPD. 6. One vessel coronary atherosclerosis.  Aortic Atherosclerosis (ICD10-I70.0) and Emphysema (ICD10-J43.9).   09/15/2017 Imaging   1. No evidence of interstitial lung disease. 2. Emphysema (ICD10-J43.9).   10/29/2017 Imaging   1. Moderate quality exam for pulmonary embolism with primary limitation of motion. No embolism identified. 2. Findings of congestive heart failure, including bilateral pleural effusions and septal thickening. 3. New thoracic adenopathy since approximately 6 weeks ago. Favor secondary to fluid overload/congestive heart failure. 4. New left apical 4 mm pulmonary nodule. Favored to represent a subpleural lymph node. Non-contrast chest CT can be considered in 12 months, given risk factors  for primary bronchogenic carcinoma. This recommendation follows the consensus statement: Guidelines for Management of Incidental Pulmonary Nodules Detected on CT Images: From the Fleischner Society 2017;  Radiology 2017; 656:812-751.    10/29/2017 - 11/02/2017 Hospital Admission   He was admitted to the hospital due to shortness of breath and was found to have congestive heart failure.   10/30/2017 Imaging   Definity used; severe global reduction in LV systolic function; severe LVE; restrictive filling; mild MR; mild LAE; mild RVE with moderate RV dysfunction; mild TR with moderate pulmonary hypertension.   01/26/2018 Imaging   1. Regression of soft tissue in the postoperative right neck favoring scarring. No new or progressive finding to suggest recurrent disease. 2. Chest CT reported separately   01/26/2018 Imaging   1. No evidence of thoracic metastasis. 2. Port in the anterior chest wall with tip in distal    03/12/2018 Imaging   1. Trace, silent aspiration noted after a swallow consistent with aspiration of residual barium in the hypopharynx. 2. Retrograde reflux of contrast into the posterior nasopharynx with swallowing. 3. No gross esophageal abnormality 4. Initial difficulty swallowing a 13 mm barium tablet that than passes readily into the stomach once it is swallowed.   05/31/2018 Imaging   Resolution of previously seen mucosal hyperenhancement in the pharynx and larynx. Otherwise unchanged examination of the neck without evidence of recurrent disease or cervical nodal metastases.    06/07/2018 Echocardiogram   1. The left ventricle has moderately reduced systolic function, with an ejection fraction of 35-40%. The cavity size was severely dilated. Left ventricular diastolic Doppler parameters are consistent with impaired relaxation Left ventricular diffuse hypokinesis.  2. The right ventricle has normal systolic function. The cavity was moderately enlarged. There is no increase in right ventricular wall thickness.  3. The mitral valve is normal in structure.  4. The tricuspid valve is normal in structure.  5. The aortic valve is normal in structure.  6. The pulmonic valve was  normal in structure.  7. The inferior vena cava was dilated in size with >50% respiratory variability.     REVIEW OF SYSTEMS:   Constitutional: Denies fevers, chills  Eyes: Denies blurriness of vision Ears, nose, mouth, throat, and face: Denies mucositis or sore throat Respiratory: Denies cough, dyspnea or wheezes Cardiovascular: Denies palpitation, chest discomfort or lower extremity swelling Gastrointestinal:  Denies nausea, heartburn or change in bowel habits Skin: Denies abnormal skin rashes Lymphatics: Denies new lymphadenopathy or easy bruising Neurological:Denies numbness, tingling or new weaknesses Behavioral/Psych: Mood is stable, no new changes  All other systems were reviewed with the patient and are negative.  I have reviewed the past medical history, past surgical history, social history and family history with the patient and they are unchanged from previous note.  ALLERGIES:  is allergic to bee pollen and pollen extract.  MEDICATIONS:  Current Outpatient Medications  Medication Sig Dispense Refill  . albuterol (PROVENTIL HFA;VENTOLIN HFA) 108 (90 Base) MCG/ACT inhaler Inhale 2 puffs into the lungs every 6 (six) hours as needed for wheezing or shortness of breath. 1 Inhaler 6  . aspirin EC 81 MG tablet Take 81 mg by mouth daily.    . fluticasone (FLONASE) 50 MCG/ACT nasal spray Place 2 sprays into both nostrils daily. 16 g 2  . hydrALAZINE (APRESOLINE) 25 MG tablet Take 1 tablet (25 mg total) by mouth 3 (three) times daily for 20 days.    . isosorbide mononitrate (IMDUR) 30 MG 24 hr tablet Take 1 tablet (30  mg total) by mouth daily. 90 tablet 3  . levothyroxine (SYNTHROID) 50 MCG tablet Take 1 tablet (50 mcg total) by mouth daily before breakfast. 90 tablet 1  . lidocaine-prilocaine (EMLA) cream Apply 1 application topically as needed (port). 30 g 11  . metoprolol succinate (TOPROL-XL) 50 MG 24 hr tablet Take 1 tablet (50 mg total) by mouth daily. Take with or  immediately following a meal. 90 tablet 3  . oxyCODONE (ROXICODONE) 15 MG immediate release tablet TAKE 1 TABLET BY MOUTH EVERY 6 HOURS AS NEEDED 90 tablet 0  . sacubitril-valsartan (ENTRESTO) 24-26 MG Take 1 tablet by mouth 2 (two) times daily. 60 tablet 6   No current facility-administered medications for this visit.   Facility-Administered Medications Ordered in Other Visits  Medication Dose Route Frequency Provider Last Rate Last Admin  . sodium chloride 0.9 % injection 10 mL  10 mL Intravenous PRN Alvy Bimler, Kree Rafter, MD   10 mL at 11/29/16 0818  . sodium chloride 0.9 % injection 10 mL  10 mL Intravenous PRN Alvy Bimler, Jennife Zaucha, MD        PHYSICAL EXAMINATION: ECOG PERFORMANCE STATUS: 1 - Symptomatic but completely ambulatory  Vitals:   10/22/19 1140  BP: 109/64  Pulse: 88  Resp: 18  Temp: 98.3 F (36.8 C)  SpO2: 100%   Filed Weights   10/22/19 1140  Weight: 233 lb 9.6 oz (106 kg)    GENERAL:alert, no distress and comfortable SKIN: skin color, texture, turgor are normal, no rashes or significant lesions EYES: normal, Conjunctiva are pink and non-injected, sclera clear OROPHARYNX:no exudate, no erythema and lips, buccal mucosa, and tongue normal  NECK: Well-healed surgical scar on the right side of his neck.   LYMPH:  no palpable lymphadenopathy in the cervical, axillary or inguinal LUNGS: clear to auscultation and percussion with normal breathing effort HEART: regular rate & rhythm and no murmurs and no lower extremity edema ABDOMEN:abdomen soft, non-tender and normal bowel sounds Musculoskeletal:no cyanosis of digits and no clubbing  NEURO: alert & oriented x 3 with fluent speech, no focal motor/sensory deficits  LABORATORY DATA:  I have reviewed the data as listed    Component Value Date/Time   NA 141 07/16/2019 0856   NA 141 04/20/2017 1028   K 4.4 07/16/2019 0856   K 4.3 04/20/2017 1028   CL 106 07/16/2019 0856   CO2 25 07/16/2019 0856   CO2 26 04/20/2017 1028   GLUCOSE  94 07/16/2019 0856   GLUCOSE 96 04/20/2017 1028   BUN 17 07/16/2019 0856   BUN 16.7 04/20/2017 1028   CREATININE 1.68 (H) 07/16/2019 0856   CREATININE 2.0 (H) 04/20/2017 1028   CALCIUM 9.5 07/16/2019 0856   CALCIUM 9.0 04/20/2017 1028   PROT 7.2 07/16/2019 0856   PROT 7.0 04/20/2017 1028   ALBUMIN 3.9 07/16/2019 0856   ALBUMIN 4.0 04/20/2017 1028   AST 38 07/16/2019 0856   AST 20 04/20/2017 1028   ALT 33 07/16/2019 0856   ALT 17 04/20/2017 1028   ALKPHOS 55 07/16/2019 0856   ALKPHOS 61 04/20/2017 1028   BILITOT 0.4 07/16/2019 0856   BILITOT 0.49 04/20/2017 1028   GFRNONAA 46 (L) 07/16/2019 0856   GFRAA 54 (L) 07/16/2019 0856    No results found for: SPEP, UPEP  Lab Results  Component Value Date   WBC 4.9 10/22/2019   NEUTROABS 3.7 10/22/2019   HGB 12.8 (L) 10/22/2019   HCT 37.9 (L) 10/22/2019   MCV 102.2 (H) 10/22/2019   PLT 191  10/22/2019      Chemistry      Component Value Date/Time   NA 141 07/16/2019 0856   NA 141 04/20/2017 1028   K 4.4 07/16/2019 0856   K 4.3 04/20/2017 1028   CL 106 07/16/2019 0856   CO2 25 07/16/2019 0856   CO2 26 04/20/2017 1028   BUN 17 07/16/2019 0856   BUN 16.7 04/20/2017 1028   CREATININE 1.68 (H) 07/16/2019 0856   CREATININE 2.0 (H) 04/20/2017 1028      Component Value Date/Time   CALCIUM 9.5 07/16/2019 0856   CALCIUM 9.0 04/20/2017 1028   ALKPHOS 55 07/16/2019 0856   ALKPHOS 61 04/20/2017 1028   AST 38 07/16/2019 0856   AST 20 04/20/2017 1028   ALT 33 07/16/2019 0856   ALT 17 04/20/2017 1028   BILITOT 0.4 07/16/2019 0856   BILITOT 0.49 04/20/2017 1028

## 2019-10-22 NOTE — Telephone Encounter (Signed)
-----   Message from [name], MD sent at 10/22/2019 12:49 PM EDT ----- Regarding: his kidney tests are stable, but liver enzymes are elevated, I think due to alcohol, recommend him not to drink

## 2019-10-22 NOTE — Assessment & Plan Note (Signed)
His last Ct imaging showed no signs of cancer We will discontinue his treatment permanently due to significant immune mediated reactions which has since resolved I recommend maintaining port patency with port flushes every 8 weeks I explained to the patient importance of staying abstinent from alcohol due to risk of recurrence of cancer with alcohol intake

## 2019-10-22 NOTE — Assessment & Plan Note (Signed)
He has acquired hypothyroidism secondary to treatment We will continue to monitor his TSH and adjust medication prn

## 2019-10-22 NOTE — Assessment & Plan Note (Signed)
He has multifactorial pain, 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  We discussed briefly about narcotic refill policy 

## 2019-10-22 NOTE — Telephone Encounter (Signed)
Called and given below message. He verbalized understanding. 

## 2019-10-24 ENCOUNTER — Other Ambulatory Visit: Payer: Self-pay | Admitting: Hematology and Oncology

## 2019-10-29 ENCOUNTER — Other Ambulatory Visit: Payer: Self-pay | Admitting: Hematology and Oncology

## 2019-10-29 DIAGNOSIS — C099 Malignant neoplasm of tonsil, unspecified: Secondary | ICD-10-CM

## 2019-10-29 MED ORDER — OXYCODONE HCL 15 MG PO TABS: 15.0000 mg | ORAL_TABLET | Freq: Four times a day (QID) | ORAL | 0 refills | Status: DC | PRN

## 2019-10-29 MED FILL — oxyCODONE HCL 15 MG TABS: 15 | 23 days supply | Qty: 90 | Fill #0

## 2019-11-15 ENCOUNTER — Other Ambulatory Visit: Payer: Self-pay | Admitting: Hematology and Oncology

## 2019-11-15 ENCOUNTER — Telehealth: Payer: Self-pay

## 2019-11-15 DIAGNOSIS — C099 Malignant neoplasm of tonsil, unspecified: Secondary | ICD-10-CM

## 2019-11-15 NOTE — Telephone Encounter (Signed)
Pt called requesting refill for pain medication. Informed pt it was too soon to refill. Pt states he has 10 left, but was just trying to get ahead. Pt understands to call back.

## 2019-11-18 MED FILL — oxyCODONE HCL 15 MG TABS: 15 | 23 days supply | Qty: 90 | Fill #0

## 2019-11-26 ENCOUNTER — Other Ambulatory Visit: Payer: Self-pay

## 2019-11-26 ENCOUNTER — Telehealth: Payer: Self-pay

## 2019-11-26 ENCOUNTER — Other Ambulatory Visit: Payer: Self-pay | Admitting: Hematology and Oncology

## 2019-11-26 DIAGNOSIS — C090 Malignant neoplasm of tonsillar fossa: Secondary | ICD-10-CM

## 2019-11-26 DIAGNOSIS — J189 Pneumonia, unspecified organism: Secondary | ICD-10-CM

## 2019-11-26 DIAGNOSIS — C770 Secondary and unspecified malignant neoplasm of lymph nodes of head, face and neck: Secondary | ICD-10-CM

## 2019-11-26 DIAGNOSIS — C099 Malignant neoplasm of tonsil, unspecified: Secondary | ICD-10-CM

## 2019-11-26 MED ORDER — ONDANSETRON HCL 8 MG PO TABS
8.0000 mg | ORAL_TABLET | Freq: Three times a day (TID) | ORAL | 0 refills | Status: DC | PRN
Start: 2019-11-26 — End: 2021-12-27

## 2019-11-26 MED FILL — ONDANSETRON HCL 8 MG TABLET: 8 | 10 days supply | Qty: 30 | Fill #0

## 2019-11-26 NOTE — Telephone Encounter (Signed)
Called and given below message. He verbalized understanding.He would like to see Dr. Alvy Bimler. He would like a lab only appt tomorrow, scheduled at 1200 and scheduled to see Dr. Alvy Bimler at 1220 on Friday. He is aware of the appt times. Zofran Rx sent to preferred pharmacy.

## 2019-11-26 NOTE — Telephone Encounter (Signed)
Received a transfer message from the triage line. He is requesting appt as soon as possible. He is sick with his thyroid again like befoer. Not able to eat and losing weight. He is vomiting and having a hard time keeping food down.

## 2019-11-26 NOTE — Telephone Encounter (Signed)
I have no room to add him on next 2 days He can come in first to get labs or see Laddie Aquas can call in compazine or zofran prn The earliest I can see him is Friday

## 2019-11-27 ENCOUNTER — Inpatient Hospital Stay: Payer: Medicaid Other | Attending: Hematology and Oncology

## 2019-11-27 ENCOUNTER — Other Ambulatory Visit: Payer: Self-pay

## 2019-11-27 DIAGNOSIS — G8929 Other chronic pain: Secondary | ICD-10-CM | POA: Diagnosis not present

## 2019-11-27 DIAGNOSIS — N183 Chronic kidney disease, stage 3 unspecified: Secondary | ICD-10-CM | POA: Diagnosis not present

## 2019-11-27 DIAGNOSIS — I2510 Atherosclerotic heart disease of native coronary artery without angina pectoris: Secondary | ICD-10-CM | POA: Insufficient documentation

## 2019-11-27 DIAGNOSIS — Z5111 Encounter for antineoplastic chemotherapy: Secondary | ICD-10-CM

## 2019-11-27 DIAGNOSIS — Z923 Personal history of irradiation: Secondary | ICD-10-CM | POA: Diagnosis not present

## 2019-11-27 DIAGNOSIS — F339 Major depressive disorder, recurrent, unspecified: Secondary | ICD-10-CM | POA: Insufficient documentation

## 2019-11-27 DIAGNOSIS — I509 Heart failure, unspecified: Secondary | ICD-10-CM | POA: Diagnosis not present

## 2019-11-27 DIAGNOSIS — J90 Pleural effusion, not elsewhere classified: Secondary | ICD-10-CM | POA: Insufficient documentation

## 2019-11-27 DIAGNOSIS — R630 Anorexia: Secondary | ICD-10-CM | POA: Diagnosis not present

## 2019-11-27 DIAGNOSIS — C090 Malignant neoplasm of tonsillar fossa: Secondary | ICD-10-CM

## 2019-11-27 DIAGNOSIS — Z9221 Personal history of antineoplastic chemotherapy: Secondary | ICD-10-CM | POA: Diagnosis not present

## 2019-11-27 DIAGNOSIS — C099 Malignant neoplasm of tonsil, unspecified: Secondary | ICD-10-CM | POA: Insufficient documentation

## 2019-11-27 DIAGNOSIS — R748 Abnormal levels of other serum enzymes: Secondary | ICD-10-CM | POA: Insufficient documentation

## 2019-11-27 DIAGNOSIS — Z79899 Other long term (current) drug therapy: Secondary | ICD-10-CM | POA: Diagnosis not present

## 2019-11-27 DIAGNOSIS — R634 Abnormal weight loss: Secondary | ICD-10-CM | POA: Insufficient documentation

## 2019-11-27 LAB — CBC WITH DIFFERENTIAL/PLATELET
Abs Immature Granulocytes: 0.02 10*3/uL (ref 0.00–0.07)
Basophils Absolute: 0 10*3/uL (ref 0.0–0.1)
Basophils Relative: 0 %
Eosinophils Absolute: 0 10*3/uL (ref 0.0–0.5)
Eosinophils Relative: 0 %
HCT: 42.7 % (ref 39.0–52.0)
Hemoglobin: 14.5 g/dL (ref 13.0–17.0)
Immature Granulocytes: 1 %
Lymphocytes Relative: 15 %
Lymphs Abs: 0.6 10*3/uL — ABNORMAL LOW (ref 0.7–4.0)
MCH: 34 pg (ref 26.0–34.0)
MCHC: 34 g/dL (ref 30.0–36.0)
MCV: 100 fL (ref 80.0–100.0)
Monocytes Absolute: 0.7 10*3/uL (ref 0.1–1.0)
Monocytes Relative: 16 %
Neutro Abs: 2.8 10*3/uL (ref 1.7–7.7)
Neutrophils Relative %: 68 %
Platelets: 131 10*3/uL — ABNORMAL LOW (ref 150–400)
RBC: 4.27 MIL/uL (ref 4.22–5.81)
RDW: 13.4 % (ref 11.5–15.5)
WBC: 4.1 10*3/uL (ref 4.0–10.5)
nRBC: 0 % (ref 0.0–0.2)

## 2019-11-27 LAB — CMP (CANCER CENTER ONLY)
ALT: 85 U/L — ABNORMAL HIGH (ref 0–44)
AST: 88 U/L — ABNORMAL HIGH (ref 15–41)
Albumin: 3.8 g/dL (ref 3.5–5.0)
Alkaline Phosphatase: 61 U/L (ref 38–126)
Anion gap: 11 (ref 5–15)
BUN: 20 mg/dL (ref 6–20)
CO2: 26 mmol/L (ref 22–32)
Calcium: 9.7 mg/dL (ref 8.9–10.3)
Chloride: 100 mmol/L (ref 98–111)
Creatinine: 1.67 mg/dL — ABNORMAL HIGH (ref 0.61–1.24)
GFR, Est AFR Am: 54 mL/min — ABNORMAL LOW
GFR, Estimated: 46 mL/min — ABNORMAL LOW
Glucose, Bld: 113 mg/dL — ABNORMAL HIGH (ref 70–99)
Potassium: 3.6 mmol/L (ref 3.5–5.1)
Sodium: 137 mmol/L (ref 135–145)
Total Bilirubin: 0.6 mg/dL (ref 0.3–1.2)
Total Protein: 7.3 g/dL (ref 6.5–8.1)

## 2019-11-27 LAB — TSH: TSH: 1.954 u[IU]/mL (ref 0.320–4.118)

## 2019-11-29 ENCOUNTER — Other Ambulatory Visit: Payer: Self-pay

## 2019-11-29 ENCOUNTER — Inpatient Hospital Stay (HOSPITAL_BASED_OUTPATIENT_CLINIC_OR_DEPARTMENT_OTHER): Payer: Medicaid Other | Admitting: Hematology and Oncology

## 2019-11-29 ENCOUNTER — Encounter: Payer: Self-pay | Admitting: Hematology and Oncology

## 2019-11-29 DIAGNOSIS — R634 Abnormal weight loss: Secondary | ICD-10-CM | POA: Diagnosis not present

## 2019-11-29 DIAGNOSIS — F339 Major depressive disorder, recurrent, unspecified: Secondary | ICD-10-CM

## 2019-11-29 DIAGNOSIS — R748 Abnormal levels of other serum enzymes: Secondary | ICD-10-CM

## 2019-11-29 DIAGNOSIS — C099 Malignant neoplasm of tonsil, unspecified: Secondary | ICD-10-CM

## 2019-11-29 NOTE — Progress Notes (Signed)
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 However, if he continues to have progressive weight loss and abnormal liver function tests in his next visit, I plan to order CT imaging for further evaluation  Major depression, recurrent, chronic ([place]) He has signs and symptoms of major depression secondary to his social situation I provided some counseling to the patient He does not feel that he needs medication right now I recommend him to stay abstinent from alcohol intake I will reassess him again at the end of the month for further follow-up  Weight loss, non-intentional His thyroid function test is stable I suspect his weight loss is due to major depression I provided him with some counseling I will reassess next visit  Elevated liver enzymes He has elevated liver enzymes could be related to alcohol intake I recommend him to stop drinking I plan to recheck his liver enzymes again at the end of the month but if it remains persistently elevated, I plan to order CT imaging   No orders of the defined types were placed in this encounter.   All questions were answered. The patient knows to call the clinic with any problems, questions or concerns. 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 FNA of right level III lymph node was inconclusive for cancer   07/25/2013 Imaging   PET scan showed locally advanced disease with abnormal lymphadenopathy in the right axilla   08/06/2013 Procedure   CT-guided biopsy of the lymphadenopathy was negative for malignancy   08/15/2013 Surgery   Patient has placement of port and feeding tube   08/19/2013 - 09/10/2013 Chemotherapy   Patient received chemotherapy with cisplatin. The patient only received 2 doses due to uncontrolled nausea and acute renal failure.   08/19/2013 - 10/15/2013 Radiation Therapy   Received Helical IMRT Tomotherapy:  Right Tonstil and bilateral neck / 70 Gy in 35 fractions to gross disease, 63 Gy in 35 fractions to high risk nodal echelons, and 56 Gy in 35 fractions to intermediate risk nodal echelons.   08/27/2013 -  08/30/2013 Hospital Admission   The patient was admitted to the hospital for uncontrolled nausea vomiting and dehydration.   02/14/2014 Imaging   PET/CT scan showed complete response to treatment   03/19/2014 Surgery   He had excisional lymph node biopsy from the right neck. Pathology was  benign   05/13/2014 Surgery   He had removal of Port-A-Cath.   05/22/2014 Imaging   Repeat CT scan of the neck show no evidence of disease recurrence.   12/09/2014 Imaging   Ct neck without contrast showed persistent abnormalities on the right side of neck, indeterminate   12/25/2014 Imaging   PET CT scan showed disease recurrence.   02/10/2015 Procedure   He has placement of port   02/13/2015 - 07/13/2015 Chemotherapy   He received chemotherapy with carbo/Taxol   04/21/2015 Imaging   PET CT scan showed improved disease control   08/04/2015 Imaging   PET scan showed persistent disease   08/17/2015 -  Chemotherapy   He was started with [facility]   10/19/2015 Imaging   Ct neck showed mass-like intermediate density soft tissue at the right lateral neck recurrence site stable.    02/26/2016 Imaging   CT neck showed unchanged appearance of right neck recurrence compared to 10/19/2015 CT. No noncontrast evidence of new metastatic disease in the neck. Chronic sinusitis, progressed.   02/26/2016 Imaging   Diffuse but patchy and asymmetric partial airspace filling process (ground-glass opacity) in the lungs. This could be due to respiratory bronchiolitis, hypersensitivity pneumonitis or possible drug reaction. Atypical/viral pneumonia is also possible. Pulmonary consultation may be a helpful. A three-month follow-up noncontrast chest CT is suggested. Slight interval enlargement of mediastinal lymph nodes and a small lymph node along the left major fissure. This is most likely due to the inflammatory process in the lungs. No findings for metastatic disease involving the chest. No findings for upper abdominal metastatic disease.   02/29/2016 Adverse Reaction   His treatment is placed on hold due to possible hypersensitivity pneumonitis/drug reaction   04/14/2016 Imaging   Ct chest showed no evidence for metastatic disease within the chest. Significant interval improvement and near complete resolution  of previously described diffuse bilateral predominately ground-glass pulmonary opacities, most compatible with resolving infectious/inflammatory process.   09/05/2016 Imaging   CT scan of neck and chest  1. Unchanged appearance of right neck recurrence. 2. No evidence of new metastatic disease in the neck. 3. Unremarkable and stable CT appearance of the chest. No findings suspicious for metastatic disease   07/24/2017 Imaging   1. No findings suspicious for metastatic disease in the chest. 2. No acute consolidative airspace disease to suggest a pneumonia. 3. No appreciable change in chronic mild patchy upper lung predominant centrilobular micronodularity. If the patient is a current smoker, these findings are most compatible with smoking related interstitial lung disease. If the patient is not a current smoker, these findings suggest subacute hypersensitivity pneumonitis  or postinflammatory change. 4. Stable mild biapical radiation fibrosis. 5. Mild to moderate centrilobular emphysema with diffuse bronchial wall thickening, suggesting COPD. 6. One vessel coronary atherosclerosis.  Aortic Atherosclerosis (ICD10-I70.0) and Emphysema (ICD10-J43.9).   09/15/2017 Imaging   1. No evidence of interstitial lung disease. 2. Emphysema (ICD10-J43.9).   10/29/2017 Imaging   1. Moderate quality exam for pulmonary embolism with primary limitation of motion. No embolism identified. 2. Findings of congestive heart failure, including bilateral pleural effusions and septal thickening. 3. New thoracic adenopathy since approximately 6 weeks ago. Favor secondary to fluid overload/congestive heart failure.  4. New left apical 4 mm pulmonary nodule. Favored to represent a subpleural lymph node. Non-contrast chest CT can be considered in 12 months, given risk factors for primary bronchogenic carcinoma. This recommendation follows the consensus statement: Guidelines for Management of Incidental Pulmonary Nodules  Detected on CT Images: From the Fleischner Society 2017; Radiology 2017; [id number].    10/29/2017 - 11/02/2017 Hospital Admission   He was admitted to the hospital due to shortness of breath and was found to have congestive heart failure.   10/30/2017 Imaging   Definity used; severe global reduction in LV systolic function; severe LVE; restrictive filling; mild MR; mild LAE; mild RVE with moderate RV dysfunction; mild TR with moderate pulmonary hypertension.   01/26/2018 Imaging   1. Regression of soft tissue in the postoperative right neck favoring scarring. No new or progressive finding to suggest recurrent disease. 2. Chest CT reported separately   01/26/2018 Imaging   1. No evidence of thoracic metastasis. 2. Port in the anterior chest wall with tip in distal    03/12/2018 Imaging   1. Trace, silent aspiration noted after a swallow consistent with aspiration of residual barium in the hypopharynx. 2. Retrograde reflux of contrast into the posterior nasopharynx with swallowing. 3. No gross esophageal abnormality 4. Initial difficulty swallowing a 13 mm barium tablet that than passes readily into the stomach once it is swallowed.   05/31/2018 Imaging   Resolution of previously seen mucosal hyperenhancement in the pharynx and larynx. Otherwise unchanged examination of the neck without evidence of recurrent disease or cervical nodal metastases.    06/07/2018 Echocardiogram   1. The left ventricle has moderately reduced systolic function, with an ejection fraction of 35-40%. The cavity size was severely dilated. Left ventricular diastolic Doppler parameters are consistent with impaired relaxation Left ventricular diffuse hypokinesis.  2. The right ventricle has normal systolic function. The cavity was moderately enlarged. There is no increase in right ventricular wall thickness.  3. The mitral valve is normal in structure.  4. The tricuspid valve is normal in structure.  5. The aortic  valve is normal in structure.  6. The pulmonic valve was normal in structure.  7. The inferior vena cava was dilated in size with >50% respiratory variability.     REVIEW OF SYSTEMS:   Constitutional: Denies fevers, chills  Eyes: Denies blurriness of vision Ears, nose, mouth, throat, and face: Denies mucositis or sore throat Respiratory: Denies cough, dyspnea or wheezes Cardiovascular: Denies palpitation, chest discomfort or lower extremity swelling Gastrointestinal:  Denies nausea, heartburn or change in bowel habits Skin: Denies abnormal skin rashes Lymphatics: Denies new lymphadenopathy or easy bruising Neurological:Denies numbness, tingling or new weaknesses All other systems were reviewed with the patient and are negative.  I have reviewed the past medical history, past surgical history, social history and family history with the patient and they are unchanged from previous note.  ALLERGIES:  is allergic to bee pollen and pollen extract.  MEDICATIONS:  Current Outpatient Medications  Medication Sig Dispense Refill  . albuterol (PROVENTIL HFA;VENTOLIN HFA) 108 (90 Base) MCG/ACT inhaler Inhale 2 puffs into the lungs every 6 (six) hours as needed for wheezing or shortness of breath. 1 Inhaler 6  . aspirin EC 81 MG tablet Take 81 mg by mouth daily.    . fluticasone (FLONASE) 50 MCG/ACT nasal spray Place 2 sprays into both nostrils daily. 16 g 2  . hydrALAZINE (APRESOLINE) 25 MG tablet Take 1 tablet (25 mg total) by mouth 3 (three) times daily  for 20 days.    . isosorbide mononitrate (IMDUR) 30 MG 24 hr tablet Take 1 tablet (30 mg total) by mouth daily. 90 tablet 3  . levothyroxine (SYNTHROID) 50 MCG tablet Take 1 tablet (50 mcg total) by mouth daily before breakfast. 90 tablet 1  . lidocaine-prilocaine (EMLA) cream Apply 1 application topically as needed (port). 30 g 11  . metoprolol succinate (TOPROL-XL) 50 MG 24 hr tablet Take 1 tablet (50 mg total) by mouth daily. Take with or  immediately following a meal. 90 tablet 3  . ondansetron (ZOFRAN) 8 MG tablet Take 1 tablet (8 mg total) by mouth every 8 (eight) hours as needed for nausea or vomiting. 30 tablet 0  . oxyCODONE (ROXICODONE) 15 MG immediate release tablet TAKE 1 TABLET BY MOUTH EVERY 6 HOURS AS NEEDED. 90 tablet 0  . sacubitril-valsartan (ENTRESTO) 24-26 MG Take 1 tablet by mouth 2 (two) times daily. 60 tablet 6   No current facility-administered medications for this visit.   Facility-Administered Medications Ordered in Other Visits  Medication Dose Route Frequency Provider Last Rate Last Admin  . sodium chloride 0.9 % injection 10 mL  10 mL Intravenous PRN Alvy Bimler, Cale Bethard, MD   10 mL at 11/29/16 0818  . sodium chloride 0.9 % injection 10 mL  10 mL Intravenous PRN Alvy Bimler, Simeon Vera, MD        PHYSICAL EXAMINATION: ECOG PERFORMANCE STATUS: 1 - Symptomatic but completely ambulatory  Vitals:   11/29/19 1223  BP: 115/77  Pulse: 86  Resp: 17  Temp: 97.6 F (36.4 C)  SpO2: 100%   Filed Weights   11/29/19 1223  Weight: 224 lb (101.6 kg)    GENERAL:alert, no distress and comfortable NEURO: alert & oriented x 3 with fluent speech, no focal motor/sensory deficits.  He looks depressed  LABORATORY DATA:  I have reviewed the data as listed    Component Value Date/Time   NA 137 11/27/2019 1148   NA 141 04/20/2017 1028   K 3.6 11/27/2019 1148   K 4.3 04/20/2017 1028   CL 100 11/27/2019 1148   CO2 26 11/27/2019 1148   CO2 26 04/20/2017 1028   GLUCOSE 113 (H) 11/27/2019 1148   GLUCOSE 96 04/20/2017 1028   BUN 20 11/27/2019 1148   BUN 16.7 04/20/2017 1028   CREATININE 1.67 (H) 11/27/2019 1148   CREATININE 2.0 (H) 04/20/2017 1028   CALCIUM 9.7 11/27/2019 1148   CALCIUM 9.0 04/20/2017 1028   PROT 7.3 11/27/2019 1148   PROT 7.0 04/20/2017 1028   ALBUMIN 3.8 11/27/2019 1148   ALBUMIN 4.0 04/20/2017 1028   AST 88 (H) 11/27/2019 1148   AST 20 04/20/2017 1028   ALT 85 (H) 11/27/2019 1148   ALT 17 04/20/2017  1028   ALKPHOS 61 11/27/2019 1148   ALKPHOS 61 04/20/2017 1028   BILITOT 0.6 11/27/2019 1148   BILITOT 0.49 04/20/2017 1028   GFRNONAA 46 (L) 11/27/2019 1148   GFRAA 54 (L) 11/27/2019 1148    No results found for: SPEP, UPEP  Lab Results  Component Value Date   WBC 4.1 11/27/2019   NEUTROABS 2.8 11/27/2019   HGB 14.5 11/27/2019   HCT 42.7 11/27/2019   MCV 100.0 11/27/2019   PLT 131 (L) 11/27/2019      Chemistry      Component Value Date/Time   NA 137 11/27/2019 1148   NA 141 04/20/2017 1028   K 3.6 11/27/2019 1148   K 4.3 04/20/2017 1028   CL 100 11/27/2019 1148  CO2 26 11/27/2019 1148   CO2 26 04/20/2017 1028   BUN 20 11/27/2019 1148   BUN 16.7 04/20/2017 1028   CREATININE 1.67 (H) 11/27/2019 1148   CREATININE 2.0 (H) 04/20/2017 1028      Component Value Date/Time   CALCIUM 9.7 11/27/2019 1148   CALCIUM 9.0 04/20/2017 1028   ALKPHOS 61 11/27/2019 1148   ALKPHOS 61 04/20/2017 1028   AST 88 (H) 11/27/2019 1148   AST 20 04/20/2017 1028   ALT 85 (H) 11/27/2019 1148   ALT 17 04/20/2017 1028   BILITOT 0.6 11/27/2019 1148   BILITOT 0.49 04/20/2017 1028

## 2019-11-29 NOTE — Assessment & Plan Note (Signed)
Clinically, he has no signs of disease However, if he continues to have progressive weight loss and abnormal liver function tests in his next visit, I plan to order CT imaging for further evaluation

## 2019-11-29 NOTE — Assessment & Plan Note (Signed)
He has elevated liver enzymes could be related to alcohol intake I recommend him to stop drinking I plan to recheck his liver enzymes again at the end of the month but if it remains persistently elevated, I plan to order CT imaging

## 2019-11-29 NOTE — Assessment & Plan Note (Signed)
He has signs and symptoms of major depression secondary to his social situation I provided some counseling to the patient He does not feel that he needs medication right now I recommend him to stay abstinent from alcohol intake I will reassess him again at the end of the month for further follow-up

## 2019-11-29 NOTE — Assessment & Plan Note (Signed)
His thyroid function test is stable I suspect his weight loss is due to major depression I provided him with some counseling I will reassess next visit

## 2019-12-05 ENCOUNTER — Ambulatory Visit (HOSPITAL_COMMUNITY)
Admission: EM | Admit: 2019-12-05 | Discharge: 2019-12-05 | Disposition: A | Discharge status: Home / Self Care | Payer: Medicaid Other | Attending: Internal Medicine | Admitting: Internal Medicine

## 2019-12-05 ENCOUNTER — Other Ambulatory Visit: Payer: Self-pay

## 2019-12-05 DIAGNOSIS — U071 COVID-19: Secondary | ICD-10-CM | POA: Insufficient documentation

## 2019-12-05 DIAGNOSIS — Z20822 Contact with and (suspected) exposure to covid-19: Secondary | ICD-10-CM | POA: Diagnosis present

## 2019-12-05 LAB — SARS CORONAVIRUS 2 (TAT 6-24 HRS): SARS Coronavirus 2: POSITIVE — AB

## 2019-12-05 NOTE — ED Triage Notes (Signed)
Pt presents after wife tested positive for COVID.  Denies symptoms at this time.

## 2019-12-06 ENCOUNTER — Encounter: Payer: Self-pay | Admitting: Nurse Practitioner

## 2019-12-06 ENCOUNTER — Telehealth (HOSPITAL_COMMUNITY): Payer: Self-pay | Admitting: Nurse Practitioner

## 2019-12-06 DIAGNOSIS — U071 COVID-19: Secondary | ICD-10-CM

## 2019-12-06 NOTE — Telephone Encounter (Signed)
Called to discuss with [name] about Covid symptoms and the use of regeneron, a monoclonal antibody infusion for those with mild to moderate Covid symptoms and at a high risk of hospitalization.     Pt does not qualify for infusion therapy given asymptomatic infection. Isolation precautions discussed. Advised to contact back for consideration should he develop symptoms. Patient verbalized understanding.     Patient Active Problem List   Diagnosis Date Noted  . Major depression, recurrent, chronic ([place]) 11/29/2019  . Elevated liver enzymes 11/29/2019  . Oral lesion 04/04/2019  . Port-A-Cath in place 05/28/2018  . Anxiety disorder due to medical condition 03/26/2018  . Goals of care, counseling/discussion 01/29/2018  . Insomnia disorder 01/29/2018  . Candidiasis of mouth and esophagus ([place]) 01/17/2018  . Drug-induced skin rash 01/17/2018  . Yeast infection 01/16/2018  . CHF (congestive heart failure) ([place]) 10/30/2017  . Elevated LFTs   . Restrictive lung disease 10/09/2017  . Emphysema of lung ([place]) 09/18/2017  . Dyspnea and respiratory abnormalities 07/13/2017  . 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 Acquired hypothyroidism 09/06/2016  . Encounter for antineoplastic chemotherapy 08/09/2016  . Debility 07/15/2016  . Cough 06/17/2016  . Nasal congestion 06/17/2016  . Elevated BP without diagnosis of hypertension 06/17/2016  . Poor dentition 05/20/2016  . Drug-induced pneumonitis 02/29/2016  . Sinus congestion 02/08/2016  . Weight loss, non-intentional 01/18/2016  . 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 Dysuria 09/04/2015  . Palliative care by specialist 08/05/2015  . Insomnia 06/15/2015  . Acute gout involving toe of right foot 05/25/2015  . Acute sinusitis, unspecified 05/18/2015  . Pancytopenia due to antineoplastic chemotherapy ([place]) 04/27/2015  . Primary cancer of tonsillar fossa ([place]) 03/20/2015  . Cancer associated pain  02/12/2015  . Lung nodule, multiple 12/30/2014  . 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  . CKD (chronic kidney disease), stage III 12/02/2014  . Abdominal wall pain in left upper quadrant 09/30/2014  . Acquired dysphasia 07/30/2014  . Neuropathy due to chemotherapeutic drug ([place]) 07/30/2014  . Lymphedema of face 06/27/2014  . Dysphagia, oropharyngeal 06/27/2014  . Neck stiffness 06/27/2014  . Throat pain in adult 02/19/2014  . Anemia in neoplastic disease 10/14/2013  . Leukopenia due to antineoplastic chemotherapy ([place]) 10/14/2013  . Renal insufficiency 10/14/2013  . Nausea & vomiting 09/12/2013  . Chronic periodontitis 08/01/2013  . Tonsillar cancer ([place]) 07/16/2013   Beckey Rutter, NP [facility]  [phone] [email or web address]

## 2019-12-09 MED FILL — LEVOTHYROXINE SODIUM 50 MCG: 50 | 90 days supply | Qty: 90 | Fill #1

## 2019-12-09 MED FILL — ENTRESTO 24 MG-26 MG TABLET: 24-26 | 30 days supply | Qty: 60 | Fill #5

## 2019-12-10 ENCOUNTER — Other Ambulatory Visit: Payer: Self-pay | Admitting: Hematology and Oncology

## 2019-12-10 ENCOUNTER — Other Ambulatory Visit: Payer: Self-pay | Admitting: Physician Assistant

## 2019-12-10 ENCOUNTER — Telehealth: Payer: Self-pay

## 2019-12-10 DIAGNOSIS — C099 Malignant neoplasm of tonsil, unspecified: Secondary | ICD-10-CM

## 2019-12-10 DIAGNOSIS — I5022 Chronic systolic (congestive) heart failure: Secondary | ICD-10-CM

## 2019-12-10 DIAGNOSIS — U071 COVID-19: Secondary | ICD-10-CM

## 2019-12-10 DIAGNOSIS — N183 Chronic kidney disease, stage 3 unspecified: Secondary | ICD-10-CM

## 2019-12-10 MED ORDER — OXYCODONE HCL 15 MG PO TABS: 15.0000 mg | ORAL_TABLET | Freq: Four times a day (QID) | ORAL | 0 refills | Status: DC | PRN

## 2019-12-10 MED FILL — oxyCODONE HCL 15 MG TABS: 15 | 23 days supply | Qty: 90 | Fill #0

## 2019-12-10 NOTE — Telephone Encounter (Signed)
Called back and given below message. He verbalized understanding. He will call and schedule infusion for monoclonal antibody now. Canceled 8/30 appt and rescheudled to 9/9. He is aware of the appt times/date.

## 2019-12-10 NOTE — Telephone Encounter (Signed)
He called and left a message. He has tested positive for covid and he wase fully vaccinated. No symptoms and is is quarantining at home until 8/30. He is asking when he should get the covid booster vaccine? He is requesting a refill on his oxycodone Rx?

## 2019-12-10 NOTE — Telephone Encounter (Signed)
Per Melanie: If you have patients that are concerned they have COVID or they do have COVID, there is a hotline they can call to get the monoclonal antibody infusion. The number is [phone] and it is an answering machine where they leave their name and details on the voicemail.  There are NPs and PAs that check the voicemails frequently and screen the patients for inclusion or exclusion of the monoclonal antibody infusion.  The number to [place] Nurse is [phone] and she just gave me all this information.    He should get the infusion I will refill his pain medicine

## 2019-12-10 NOTE — Progress Notes (Signed)
I connected by phone with Mason [name] on 12/10/2019 at 2:40 PM to discuss the potential use of a new treatment for mild to moderate COVID-19 viral infection in non-hospitalized patients.  This patient is a 54 y.o. male that meets the FDA criteria for Emergency Use Authorization of COVID monoclonal antibody casirivimab/imdevimab.  Has a (+) direct SARS-CoV-2 viral test result  Has mild or moderate COVID-19   Is NOT hospitalized due to COVID-19  Is within 10 days of symptom onset  Has at least one of the high risk factor(s) for progression to severe COVID-19 and/or hospitalization as defined in EUA.  Specific high risk criteria : BMI > 25, Immunosuppressive Disease or Treatment, Cardiovascular disease or hypertension and Other high risk medical condition per CDC:  african [place] with substance abuse.    I have spoken and communicated the following to the patient or parent/caregiver regarding COVID monoclonal antibody treatment:  1. FDA has authorized the emergency use for the treatment of mild to moderate COVID-19 in adults and pediatric patients with positive results of direct SARS-CoV-2 viral testing who are 77 years of age and older weighing at least 40 kg, and who are at high risk for progressing to severe COVID-19 and/or hospitalization.  2. The significant known and potential risks and benefits of COVID monoclonal antibody, and the extent to which such potential risks and benefits are unknown.  3. Information on available alternative treatments and the risks and benefits of those alternatives, including clinical trials.  4. Patients treated with COVID monoclonal antibody should continue to self-isolate and use infection control measures (e.g., wear mask, isolate, social distance, avoid sharing personal items, clean and disinfect "high touch" surfaces, and frequent handwashing) according to CDC guidelines.   5. The patient or parent/caregiver has the option to accept or refuse  COVID monoclonal antibody treatment.  After reviewing this information with the patient, The patient agreed to proceed with receiving casirivimab\imdevimab infusion and will be provided a copy of the Fact sheet prior to receiving the infusion.  Pt is asymptomatic but high risk. He as tested on 8/19 after an exposure to his sick wife. Approved by Dr. Joya Gaskins. Set up for infusion on 8/25 @ 12:30pm. Directions given to [facility]. Pt is aware that insurance will be charged an infusion fee. Of note, pt is fully vaccinated with Pfizer and added to our breakthrough list.   [name] 12/10/2019 2:40 PM

## 2019-12-11 ENCOUNTER — Ambulatory Visit (HOSPITAL_COMMUNITY)
Admission: RE | Admit: 2019-12-11 | Discharge: 2019-12-11 | Disposition: A | Discharge status: Home / Self Care | Payer: Medicaid Other | Source: Ambulatory Visit | Attending: Pulmonary Disease | Admitting: Pulmonary Disease

## 2019-12-11 DIAGNOSIS — U071 COVID-19: Secondary | ICD-10-CM | POA: Insufficient documentation

## 2019-12-11 DIAGNOSIS — C099 Malignant neoplasm of tonsil, unspecified: Secondary | ICD-10-CM

## 2019-12-11 DIAGNOSIS — N183 Chronic kidney disease, stage 3 unspecified: Secondary | ICD-10-CM | POA: Diagnosis not present

## 2019-12-11 DIAGNOSIS — I5022 Chronic systolic (congestive) heart failure: Secondary | ICD-10-CM | POA: Insufficient documentation

## 2019-12-11 MED ORDER — FAMOTIDINE IN NACL 20-0.9 MG/50ML-% IV SOLN: 20.0000 mg | Freq: Once | INTRAVENOUS | Status: DC | PRN

## 2019-12-11 MED ORDER — SODIUM CHLORIDE 0.9 % IV SOLN
1200.0000 mg | Freq: Once | INTRAVENOUS | Status: AC
  Administered 2019-12-11: 1200 mg via INTRAVENOUS
  Filled 2019-12-11: qty 10

## 2019-12-11 MED ORDER — SODIUM CHLORIDE 0.9 % IV SOLN: INTRAVENOUS | Status: DC | PRN

## 2019-12-11 MED ORDER — METHYLPREDNISOLONE SODIUM SUCC 125 MG IJ SOLR: 125.0000 mg | Freq: Once | INTRAMUSCULAR | Status: DC | PRN

## 2019-12-11 MED ORDER — EPINEPHRINE 0.3 MG/0.3ML IJ SOAJ: 0.3000 mg | Freq: Once | INTRAMUSCULAR | Status: DC | PRN

## 2019-12-11 MED ORDER — HEPARIN SOD (PORK) LOCK FLUSH 100 UNIT/ML IV SOLN
500.0000 [IU] | INTRAVENOUS | Status: AC | PRN
  Administered 2019-12-11: 500 [IU]

## 2019-12-11 MED ORDER — ALBUTEROL SULFATE HFA 108 (90 BASE) MCG/ACT IN AERS: 2.0000 | INHALATION_SPRAY | Freq: Once | RESPIRATORY_TRACT | Status: DC | PRN

## 2019-12-11 MED ORDER — DIPHENHYDRAMINE HCL 50 MG/ML IJ SOLN: 50.0000 mg | Freq: Once | INTRAMUSCULAR | Status: DC | PRN

## 2019-12-11 NOTE — Progress Notes (Signed)
  Diagnosis: COVID-19  Physician: Dr. Joya Gaskins   Procedure: Covid Infusion Clinic Med: casirivimab\imdevimab infusion - Provided patient with casirivimab\imdevimab fact sheet for patients, parents and caregivers prior to infusion.  Complications: No immediate complications noted.  Discharge: Discharged home   Claudia Desanctis 12/11/2019

## 2019-12-11 NOTE — Discharge Instructions (Signed)

## 2019-12-17 ENCOUNTER — Inpatient Hospital Stay: Payer: Medicaid Other

## 2019-12-17 ENCOUNTER — Inpatient Hospital Stay: Payer: Medicaid Other | Admitting: Hematology and Oncology

## 2019-12-26 ENCOUNTER — Inpatient Hospital Stay: Payer: Medicaid Other

## 2019-12-26 ENCOUNTER — Inpatient Hospital Stay: Payer: Medicaid Other | Admitting: Hematology and Oncology

## 2019-12-26 ENCOUNTER — Inpatient Hospital Stay: Payer: Medicaid Other | Attending: Hematology and Oncology

## 2019-12-26 DIAGNOSIS — Z23 Encounter for immunization: Secondary | ICD-10-CM | POA: Insufficient documentation

## 2019-12-26 DIAGNOSIS — I700 Atherosclerosis of aorta: Secondary | ICD-10-CM | POA: Insufficient documentation

## 2019-12-26 DIAGNOSIS — G893 Neoplasm related pain (acute) (chronic): Secondary | ICD-10-CM | POA: Insufficient documentation

## 2019-12-26 DIAGNOSIS — I2510 Atherosclerotic heart disease of native coronary artery without angina pectoris: Secondary | ICD-10-CM | POA: Insufficient documentation

## 2019-12-26 DIAGNOSIS — R748 Abnormal levels of other serum enzymes: Secondary | ICD-10-CM | POA: Insufficient documentation

## 2019-12-26 DIAGNOSIS — J90 Pleural effusion, not elsewhere classified: Secondary | ICD-10-CM | POA: Insufficient documentation

## 2019-12-26 DIAGNOSIS — I509 Heart failure, unspecified: Secondary | ICD-10-CM | POA: Insufficient documentation

## 2019-12-26 DIAGNOSIS — N183 Chronic kidney disease, stage 3 unspecified: Secondary | ICD-10-CM | POA: Insufficient documentation

## 2019-12-26 DIAGNOSIS — C099 Malignant neoplasm of tonsil, unspecified: Secondary | ICD-10-CM | POA: Insufficient documentation

## 2019-12-26 DIAGNOSIS — R590 Localized enlarged lymph nodes: Secondary | ICD-10-CM | POA: Insufficient documentation

## 2019-12-26 DIAGNOSIS — Z79899 Other long term (current) drug therapy: Secondary | ICD-10-CM | POA: Insufficient documentation

## 2019-12-26 DIAGNOSIS — G629 Polyneuropathy, unspecified: Secondary | ICD-10-CM | POA: Insufficient documentation

## 2020-01-01 ENCOUNTER — Other Ambulatory Visit: Payer: Self-pay

## 2020-01-01 ENCOUNTER — Inpatient Hospital Stay (HOSPITAL_BASED_OUTPATIENT_CLINIC_OR_DEPARTMENT_OTHER): Payer: Medicaid Other | Admitting: Hematology and Oncology

## 2020-01-01 ENCOUNTER — Telehealth: Payer: Self-pay

## 2020-01-01 ENCOUNTER — Encounter: Payer: Self-pay | Admitting: Hematology and Oncology

## 2020-01-01 ENCOUNTER — Telehealth: Payer: Self-pay | Admitting: Hematology and Oncology

## 2020-01-01 ENCOUNTER — Inpatient Hospital Stay: Payer: Medicaid Other

## 2020-01-01 VITALS — BP 137/88 | HR 67 | Temp 97.8°F | Resp 18 | Ht 77.5 in | Wt 226.4 lb

## 2020-01-01 DIAGNOSIS — C099 Malignant neoplasm of tonsil, unspecified: Secondary | ICD-10-CM

## 2020-01-01 DIAGNOSIS — J90 Pleural effusion, not elsewhere classified: Secondary | ICD-10-CM | POA: Diagnosis not present

## 2020-01-01 DIAGNOSIS — R112 Nausea with vomiting, unspecified: Secondary | ICD-10-CM

## 2020-01-01 DIAGNOSIS — Z299 Encounter for prophylactic measures, unspecified: Secondary | ICD-10-CM

## 2020-01-01 DIAGNOSIS — R748 Abnormal levels of other serum enzymes: Secondary | ICD-10-CM

## 2020-01-01 DIAGNOSIS — N183 Chronic kidney disease, stage 3 unspecified: Secondary | ICD-10-CM

## 2020-01-01 DIAGNOSIS — G893 Neoplasm related pain (acute) (chronic): Secondary | ICD-10-CM | POA: Diagnosis not present

## 2020-01-01 DIAGNOSIS — Z23 Encounter for immunization: Secondary | ICD-10-CM | POA: Diagnosis not present

## 2020-01-01 DIAGNOSIS — I509 Heart failure, unspecified: Secondary | ICD-10-CM | POA: Diagnosis not present

## 2020-01-01 DIAGNOSIS — C090 Malignant neoplasm of tonsillar fossa: Secondary | ICD-10-CM

## 2020-01-01 DIAGNOSIS — I700 Atherosclerosis of aorta: Secondary | ICD-10-CM | POA: Diagnosis not present

## 2020-01-01 DIAGNOSIS — I2510 Atherosclerotic heart disease of native coronary artery without angina pectoris: Secondary | ICD-10-CM | POA: Diagnosis not present

## 2020-01-01 DIAGNOSIS — G629 Polyneuropathy, unspecified: Secondary | ICD-10-CM | POA: Diagnosis not present

## 2020-01-01 DIAGNOSIS — Z95828 Presence of other vascular implants and grafts: Secondary | ICD-10-CM

## 2020-01-01 DIAGNOSIS — Z79899 Other long term (current) drug therapy: Secondary | ICD-10-CM | POA: Diagnosis not present

## 2020-01-01 DIAGNOSIS — R590 Localized enlarged lymph nodes: Secondary | ICD-10-CM | POA: Diagnosis not present

## 2020-01-01 DIAGNOSIS — Z5111 Encounter for antineoplastic chemotherapy: Secondary | ICD-10-CM

## 2020-01-01 LAB — CBC WITH DIFFERENTIAL/PLATELET
Abs Immature Granulocytes: 0.02 10*3/uL (ref 0.00–0.07)
Basophils Absolute: 0 10*3/uL (ref 0.0–0.1)
Basophils Relative: 0 %
Eosinophils Absolute: 0.1 10*3/uL (ref 0.0–0.5)
Eosinophils Relative: 2 %
HCT: 37.4 % — ABNORMAL LOW (ref 39.0–52.0)
Hemoglobin: 12.5 g/dL — ABNORMAL LOW (ref 13.0–17.0)
Immature Granulocytes: 0 %
Lymphocytes Relative: 12 %
Lymphs Abs: 0.6 10*3/uL — ABNORMAL LOW (ref 0.7–4.0)
MCH: 34.4 pg — ABNORMAL HIGH (ref 26.0–34.0)
MCHC: 33.4 g/dL (ref 30.0–36.0)
MCV: 103 fL — ABNORMAL HIGH (ref 80.0–100.0)
Monocytes Absolute: 0.6 10*3/uL (ref 0.1–1.0)
Monocytes Relative: 11 %
Neutro Abs: 3.9 10*3/uL (ref 1.7–7.7)
Neutrophils Relative %: 75 %
Platelets: 176 10*3/uL (ref 150–400)
RBC: 3.63 MIL/uL — ABNORMAL LOW (ref 4.22–5.81)
RDW: 14.2 % (ref 11.5–15.5)
WBC: 5.3 10*3/uL (ref 4.0–10.5)
nRBC: 0 % (ref 0.0–0.2)

## 2020-01-01 LAB — CMP (CANCER CENTER ONLY)
ALT: 58 U/L — ABNORMAL HIGH (ref 0–44)
AST: 91 U/L — ABNORMAL HIGH (ref 15–41)
Albumin: 3.8 g/dL (ref 3.5–5.0)
Alkaline Phosphatase: 62 U/L (ref 38–126)
Anion gap: 4 — ABNORMAL LOW (ref 5–15)
BUN: 12 mg/dL (ref 6–20)
CO2: 30 mmol/L (ref 22–32)
Calcium: 9.5 mg/dL (ref 8.9–10.3)
Chloride: 104 mmol/L (ref 98–111)
Creatinine: 1.56 mg/dL — ABNORMAL HIGH (ref 0.61–1.24)
GFR, Est AFR Am: 58 mL/min — ABNORMAL LOW (ref >60)
GFR, Estimated: 50 mL/min — ABNORMAL LOW (ref >60)
Glucose, Bld: 70 mg/dL (ref 70–99)
Potassium: 4.1 mmol/L (ref 3.5–5.1)
Sodium: 138 mmol/L (ref 135–145)
Total Bilirubin: 0.7 mg/dL (ref 0.3–1.2)
Total Protein: 7 g/dL (ref 6.5–8.1)

## 2020-01-01 LAB — TSH: TSH: 3.223 u[IU]/mL (ref 0.320–4.118)

## 2020-01-01 MED ORDER — SODIUM CHLORIDE 0.9% FLUSH
10.0000 mL | Freq: Once | INTRAVENOUS | Status: AC
  Administered 2020-01-01: 10 mL
  Filled 2020-01-01: qty 10

## 2020-01-01 MED ORDER — HEPARIN SOD (PORK) LOCK FLUSH 100 UNIT/ML IV SOLN
500.0000 [IU] | Freq: Once | INTRAVENOUS | Status: AC | PRN
  Administered 2020-01-01: 500 [IU] via INTRAVENOUS
  Filled 2020-01-01: qty 5

## 2020-01-01 MED ORDER — INFLUENZA VAC SPLIT QUAD 0.5 ML IM SUSY
0.5000 mL | PREFILLED_SYRINGE | Freq: Once | INTRAMUSCULAR | Status: AC
  Administered 2020-01-01: 0.5 mL via INTRAMUSCULAR

## 2020-01-01 MED ORDER — INFLUENZA VAC SPLIT QUAD 0.5 ML IM SUSY
PREFILLED_SYRINGE | INTRAMUSCULAR | Status: AC
  Filled 2020-01-01: qty 0.5

## 2020-01-01 MED ORDER — OXYCODONE HCL 15 MG PO TABS: 15.0000 mg | ORAL_TABLET | Freq: Four times a day (QID) | ORAL | 0 refills | Status: DC | PRN

## 2020-01-01 MED FILL — oxyCODONE HCL 15 MG TABS: 15 | 22 days supply | Qty: 90 | Fill #0

## 2020-01-01 NOTE — Assessment & Plan Note (Signed)
The cause is unknown I plan to order imaging study before his next visit

## 2020-01-01 NOTE — Telephone Encounter (Signed)
-----   Message from [name], MD sent at 01/01/2020  1:51 PM EDT ----- Regarding: labs His kidney tests look good but liver test still up I want to order CT scan before his next visit It looks like he did not check out, I will put the orders in

## 2020-01-01 NOTE — Assessment & Plan Note (Signed)
We discussed the importance of preventive care and reviewed the vaccination programs. He does not have any prior allergic reactions to influenza vaccination. He agrees to proceed with influenza vaccination today and we will administer it today at the clinic.  

## 2020-01-01 NOTE — Telephone Encounter (Signed)
Scheduled per 9/15 sch msg. Called and spoke with pt, confirmed 11/10 appts

## 2020-01-01 NOTE — Assessment & Plan Note (Signed)
Clinically, he has no signs of recurrence on exam However, I am concerned about elevated liver enzymes The patient remains at high risk of cancer recurrence I recommend CT imaging before I see him next visit

## 2020-01-01 NOTE — Assessment & Plan Note (Signed)
He has stable chronic kidney disease since chemotherapy Observe closely

## 2020-01-01 NOTE — Patient Instructions (Signed)

## 2020-01-01 NOTE — Progress Notes (Signed)
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  We discussed briefly about narcotic refill policy  Elevated liver enzymes The cause is unknown I plan to order imaging study before his next visit  Preventive measure We discussed the importance of preventive care and reviewed the vaccination programs. He does not have any prior allergic reactions to influenza vaccination. He agrees to proceed with influenza vaccination today and we will administer it today at the clinic.    Orders Placed This Encounter  Procedures  . CT CHEST ABDOMEN PELVIS W CONTRAST    Standing Status:   Future    Standing Expiration Date:   12/31/2020    Order Specific Question:   Preferred imaging location?    Answer:   [facility]    Order Specific Question:   Radiology Contrast Protocol - do NOT remove file path    Answer:   [email or web address]    All questions were answered. The patient knows to call the clinic with any problems, questions or concerns. The total time spent in the appointment was 25 minutes  encounter with patients including review of chart and various tests results, discussions about plan of care and coordination of care plan   [name], MD 01/01/2020 1:58 PM  INTERVAL HISTORY: Please see below for problem oriented charting. 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 FNA of right level III lymph node was inconclusive for cancer   07/25/2013 Imaging   PET scan showed locally advanced disease with abnormal lymphadenopathy in the right axilla   08/06/2013 Procedure   CT-guided biopsy of the lymphadenopathy was negative for malignancy   08/15/2013 Surgery   Patient has placement of port and feeding tube   08/19/2013 - 09/10/2013 Chemotherapy   Patient received chemotherapy with cisplatin. The patient only received 2 doses due to uncontrolled nausea and acute renal failure.   08/19/2013 - 10/15/2013 Radiation Therapy   Received Helical IMRT Tomotherapy:  Right Tonstil and bilateral neck / 70 Gy in 35 fractions to gross disease, 63 Gy in 35 fractions to high risk nodal echelons, and 56 Gy in 35 fractions to intermediate risk nodal  echelons.   08/27/2013 - 08/30/2013 Hospital Admission   The patient was admitted to the hospital for uncontrolled nausea vomiting and dehydration.   02/14/2014 Imaging   PET/CT scan showed complete response to treatment   03/19/2014 Surgery   He had excisional lymph node  biopsy from the right neck. Pathology was benign   05/13/2014 Surgery   He had removal of Port-A-Cath.   05/22/2014 Imaging   Repeat CT scan of the neck show no evidence of disease recurrence.   12/09/2014 Imaging   Ct neck without contrast showed persistent abnormalities on the right side of neck, indeterminate   12/25/2014 Imaging   PET CT scan showed disease recurrence.   02/10/2015 Procedure   He has placement of port   02/13/2015 - 07/13/2015 Chemotherapy   He received chemotherapy with carbo/Taxol   04/21/2015 Imaging   PET CT scan showed improved disease control   08/04/2015 Imaging   PET scan showed persistent disease   08/17/2015 -  Chemotherapy   He was started with [facility]   10/19/2015 Imaging   Ct neck showed mass-like intermediate density soft tissue at the right lateral neck recurrence site stable.    02/26/2016 Imaging   CT neck showed unchanged appearance of right neck recurrence compared to 10/19/2015 CT. No noncontrast evidence of new metastatic disease in the neck. Chronic sinusitis, progressed.   02/26/2016 Imaging   Diffuse but patchy and asymmetric partial airspace filling process (ground-glass opacity) in the lungs. This could be due to respiratory bronchiolitis, hypersensitivity pneumonitis or possible drug reaction. Atypical/viral pneumonia is also possible. Pulmonary consultation may be a helpful. A three-month follow-up noncontrast chest CT is suggested. Slight interval enlargement of mediastinal lymph nodes and a small lymph node along the left major fissure. This is most likely due to the inflammatory process in the lungs. No findings for metastatic disease involving the chest. No findings for upper abdominal metastatic disease.   02/29/2016 Adverse Reaction   His treatment is placed on hold due to possible hypersensitivity pneumonitis/drug reaction   04/14/2016 Imaging   Ct chest showed no evidence for metastatic disease within the chest. Significant  interval improvement and near complete resolution of previously described diffuse bilateral predominately ground-glass pulmonary opacities, most compatible with resolving infectious/inflammatory process.   09/05/2016 Imaging   CT scan of neck and chest  1. Unchanged appearance of right neck recurrence. 2. No evidence of new metastatic disease in the neck. 3. Unremarkable and stable CT appearance of the chest. No findings suspicious for metastatic disease   07/24/2017 Imaging   1. No findings suspicious for metastatic disease in the chest. 2. No acute consolidative airspace disease to suggest a pneumonia. 3. No appreciable change in chronic mild patchy upper lung predominant centrilobular micronodularity. If the patient is a current smoker, these findings are most compatible with smoking related interstitial lung disease. If the patient is not a current smoker, these findings suggest subacute hypersensitivity pneumonitis  or postinflammatory change. 4. Stable mild biapical radiation fibrosis. 5. Mild to moderate centrilobular emphysema with diffuse bronchial wall thickening, suggesting COPD. 6. One vessel coronary atherosclerosis.  Aortic Atherosclerosis (ICD10-I70.0) and Emphysema (ICD10-J43.9).   09/15/2017 Imaging   1. No evidence of interstitial lung disease. 2. Emphysema (ICD10-J43.9).   10/29/2017 Imaging   1. Moderate quality exam for pulmonary embolism with primary limitation of motion. No embolism identified. 2. Findings of congestive heart failure, including bilateral pleural effusions and septal thickening. 3. New thoracic adenopathy since approximately 6 weeks ago. Favor secondary  to fluid overload/congestive heart failure. 4. New left apical 4 mm pulmonary nodule. Favored to represent a subpleural lymph node. Non-contrast chest CT can be considered in 12 months, given risk factors for primary bronchogenic carcinoma. This recommendation follows the consensus statement: Guidelines for  Management of Incidental Pulmonary Nodules Detected on CT Images: From the Fleischner Society 2017; Radiology 2017; 124:580-998.    10/29/2017 - 11/02/2017 Hospital Admission   He was admitted to the hospital due to shortness of breath and was found to have congestive heart failure.   10/30/2017 Imaging   Definity used; severe global reduction in LV systolic function; severe LVE; restrictive filling; mild MR; mild LAE; mild RVE with moderate RV dysfunction; mild TR with moderate pulmonary hypertension.   01/26/2018 Imaging   1. Regression of soft tissue in the postoperative right neck favoring scarring. No new or progressive finding to suggest recurrent disease. 2. Chest CT reported separately   01/26/2018 Imaging   1. No evidence of thoracic metastasis. 2. Port in the anterior chest wall with tip in distal    03/12/2018 Imaging   1. Trace, silent aspiration noted after a swallow consistent with aspiration of residual barium in the hypopharynx. 2. Retrograde reflux of contrast into the posterior nasopharynx with swallowing. 3. No gross esophageal abnormality 4. Initial difficulty swallowing a 13 mm barium tablet that than passes readily into the stomach once it is swallowed.   05/31/2018 Imaging   Resolution of previously seen mucosal hyperenhancement in the pharynx and larynx. Otherwise unchanged examination of the neck without evidence of recurrent disease or cervical nodal metastases.    06/07/2018 Echocardiogram   1. The left ventricle has moderately reduced systolic function, with an ejection fraction of 35-40%. The cavity size was severely dilated. Left ventricular diastolic Doppler parameters are consistent with impaired relaxation Left ventricular diffuse hypokinesis.  2. The right ventricle has normal systolic function. The cavity was moderately enlarged. There is no increase in right ventricular wall thickness.  3. The mitral valve is normal in structure.  4. The tricuspid  valve is normal in structure.  5. The aortic valve is normal in structure.  6. The pulmonic valve was normal in structure.  7. The inferior vena cava was dilated in size with >50% respiratory variability.     REVIEW OF SYSTEMS:   Constitutional: Denies fevers, chills or abnormal weight loss Eyes: Denies blurriness of vision Ears, nose, mouth, throat, and face: Denies mucositis or sore throat Respiratory: Denies cough, dyspnea or wheezes Cardiovascular: Denies palpitation, chest discomfort or lower extremity swelling Gastrointestinal:  Denies nausea, heartburn or change in bowel habits Skin: Denies abnormal skin rashes Lymphatics: Denies new lymphadenopathy or easy bruising Neurological:Denies numbness, tingling or new weaknesses Behavioral/Psych: Mood is stable, no new changes  All other systems were reviewed with the patient and are negative.  I have reviewed the past medical history, past surgical history, social history and family history with the patient and they are unchanged from previous note.  ALLERGIES:  is allergic to bee pollen and pollen extract.  MEDICATIONS:  Current Outpatient Medications  Medication Sig Dispense Refill  . albuterol (PROVENTIL HFA;VENTOLIN HFA) 108 (90 Base) MCG/ACT inhaler Inhale 2 puffs into the lungs every 6 (six) hours as needed for wheezing or shortness of breath. 1 Inhaler 6  . aspirin EC 81 MG tablet Take 81 mg by mouth daily.    . fluticasone (FLONASE) 50 MCG/ACT nasal spray Place 2 sprays into both nostrils daily. 16 g 2  . hydrALAZINE (  APRESOLINE) 25 MG tablet Take 1 tablet (25 mg total) by mouth 3 (three) times daily for 20 days.    . isosorbide mononitrate (IMDUR) 30 MG 24 hr tablet Take 1 tablet (30 mg total) by mouth daily. 90 tablet 3  . levothyroxine (SYNTHROID) 50 MCG tablet Take 1 tablet (50 mcg total) by mouth daily before breakfast. 90 tablet 1  . lidocaine-prilocaine (EMLA) cream Apply 1 application topically as needed (port). 30 g  11  . metoprolol succinate (TOPROL-XL) 50 MG 24 hr tablet Take 1 tablet (50 mg total) by mouth daily. Take with or immediately following a meal. 90 tablet 3  . ondansetron (ZOFRAN) 8 MG tablet Take 1 tablet (8 mg total) by mouth every 8 (eight) hours as needed for nausea or vomiting. 30 tablet 0  . oxyCODONE (ROXICODONE) 15 MG immediate release tablet Take 1 tablet (15 mg total) by mouth every 6 (six) hours as needed. 90 tablet 0  . sacubitril-valsartan (ENTRESTO) 24-26 MG Take 1 tablet by mouth 2 (two) times daily. 60 tablet 6   No current facility-administered medications for this visit.   Facility-Administered Medications Ordered in Other Visits  Medication Dose Route Frequency Provider Last Rate Last Admin  . sodium chloride 0.9 % injection 10 mL  10 mL Intravenous PRN Alvy Bimler, Deshana Rominger, MD   10 mL at 11/29/16 0818  . sodium chloride 0.9 % injection 10 mL  10 mL Intravenous PRN Alvy Bimler, Adrick Kestler, MD        PHYSICAL EXAMINATION: ECOG PERFORMANCE STATUS: 1 - Symptomatic but completely ambulatory  Vitals:   01/01/20 1134  BP: 137/88  Pulse: 67  Resp: 18  Temp: 97.8 F (36.6 C)  SpO2: 99%   Filed Weights   01/01/20 1134  Weight: 226 lb 6.4 oz (102.7 kg)    GENERAL:alert, no distress and comfortable SKIN: skin color, texture, turgor are normal, no rashes or significant lesions EYES: normal, Conjunctiva are pink and non-injected, sclera clear OROPHARYNX:no exudate, no erythema and lips, buccal mucosa, and tongue normal  NECK noted well-healed surgical scar LYMPH:  no palpable lymphadenopathy in the cervical, axillary or inguinal LUNGS: clear to auscultation and percussion with normal breathing effort HEART: regular rate & rhythm and no murmurs and no lower extremity edema ABDOMEN:abdomen soft, non-tender and normal bowel sounds Musculoskeletal:no cyanosis of digits and no clubbing  NEURO: alert & oriented x 3 with fluent speech, no focal motor/sensory deficits  LABORATORY DATA:  I have  reviewed the data as listed    Component Value Date/Time   NA 138 01/01/2020 1127   NA 141 04/20/2017 1028   K 4.1 01/01/2020 1127   K 4.3 04/20/2017 1028   CL 104 01/01/2020 1127   CO2 30 01/01/2020 1127   CO2 26 04/20/2017 1028   GLUCOSE 70 01/01/2020 1127   GLUCOSE 96 04/20/2017 1028   BUN 12 01/01/2020 1127   BUN 16.7 04/20/2017 1028   CREATININE 1.56 (H) 01/01/2020 1127   CREATININE 2.0 (H) 04/20/2017 1028   CALCIUM 9.5 01/01/2020 1127   CALCIUM 9.0 04/20/2017 1028   PROT 7.0 01/01/2020 1127   PROT 7.0 04/20/2017 1028   ALBUMIN 3.8 01/01/2020 1127   ALBUMIN 4.0 04/20/2017 1028   AST 91 (H) 01/01/2020 1127   AST 20 04/20/2017 1028   ALT 58 (H) 01/01/2020 1127   ALT 17 04/20/2017 1028   ALKPHOS 62 01/01/2020 1127   ALKPHOS 61 04/20/2017 1028   BILITOT 0.7 01/01/2020 1127   BILITOT 0.49 04/20/2017 1028  GFRNONAA 50 (L) 01/01/2020 1127   GFRAA 58 (L) 01/01/2020 1127    No results found for: SPEP, UPEP  Lab Results  Component Value Date   WBC 5.3 01/01/2020   NEUTROABS 3.9 01/01/2020   HGB 12.5 (L) 01/01/2020   HCT 37.4 (L) 01/01/2020   MCV 103.0 (H) 01/01/2020   PLT 176 01/01/2020      Chemistry      Component Value Date/Time   NA 138 01/01/2020 1127   NA 141 04/20/2017 1028   K 4.1 01/01/2020 1127   K 4.3 04/20/2017 1028   CL 104 01/01/2020 1127   CO2 30 01/01/2020 1127   CO2 26 04/20/2017 1028   BUN 12 01/01/2020 1127   BUN 16.7 04/20/2017 1028   CREATININE 1.56 (H) 01/01/2020 1127   CREATININE 2.0 (H) 04/20/2017 1028      Component Value Date/Time   CALCIUM 9.5 01/01/2020 1127   CALCIUM 9.0 04/20/2017 1028   ALKPHOS 62 01/01/2020 1127   ALKPHOS 61 04/20/2017 1028   AST 91 (H) 01/01/2020 1127   AST 20 04/20/2017 1028   ALT 58 (H) 01/01/2020 1127   ALT 17 04/20/2017 1028   BILITOT 0.7 01/01/2020 1127   BILITOT 0.49 04/20/2017 1028

## 2020-01-01 NOTE — Assessment & Plan Note (Signed)
He has multifactorial pain, 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  We discussed briefly about narcotic refill policy

## 2020-01-01 NOTE — Telephone Encounter (Signed)
Called and given below message. He verbalized understanding and will call and schedule CT scan. Given phone #.

## 2020-01-16 ENCOUNTER — Telehealth: Payer: Self-pay

## 2020-01-16 NOTE — Telephone Encounter (Signed)
Pt called stating, "my throat is closing and I am having a really hard time swallowing. Last time this happened I had to go to ENT to have my throat opened up." Pt denies dyspnea and asks if he should go to ED or come in office. Pt states he is able to swallow liquids but with difficulty and is able to take his medication as well as slowly eat oatmeal. Per Dr Alvy Bimler, pt should be seen in [facility] here at [facility], however; there is no availability in [facility] today so Dr Alvy Bimler requests to see pt 10/1 at 1300. Pt is aware and in agreement with this plan, but understands he should call 911 if he is not able to swallow at all, or develops dyspnea. Message sent to scheduling to arrange appt.

## 2020-01-17 ENCOUNTER — Inpatient Hospital Stay: Payer: Medicaid Other

## 2020-01-17 ENCOUNTER — Other Ambulatory Visit: Payer: Self-pay

## 2020-01-17 ENCOUNTER — Inpatient Hospital Stay: Payer: Medicaid Other | Attending: Hematology and Oncology | Admitting: Hematology and Oncology

## 2020-01-17 ENCOUNTER — Encounter: Payer: Self-pay | Admitting: Hematology and Oncology

## 2020-01-17 VITALS — BP 103/54 | HR 91 | Temp 98.5°F | Resp 18 | Ht 77.5 in | Wt 222.2 lb

## 2020-01-17 DIAGNOSIS — E860 Dehydration: Secondary | ICD-10-CM | POA: Insufficient documentation

## 2020-01-17 DIAGNOSIS — Z95828 Presence of other vascular implants and grafts: Secondary | ICD-10-CM

## 2020-01-17 DIAGNOSIS — M542 Cervicalgia: Secondary | ICD-10-CM | POA: Insufficient documentation

## 2020-01-17 DIAGNOSIS — R1310 Dysphagia, unspecified: Secondary | ICD-10-CM | POA: Diagnosis not present

## 2020-01-17 DIAGNOSIS — I2510 Atherosclerotic heart disease of native coronary artery without angina pectoris: Secondary | ICD-10-CM | POA: Diagnosis not present

## 2020-01-17 DIAGNOSIS — I509 Heart failure, unspecified: Secondary | ICD-10-CM | POA: Insufficient documentation

## 2020-01-17 DIAGNOSIS — G629 Polyneuropathy, unspecified: Secondary | ICD-10-CM | POA: Insufficient documentation

## 2020-01-17 DIAGNOSIS — R112 Nausea with vomiting, unspecified: Secondary | ICD-10-CM | POA: Diagnosis not present

## 2020-01-17 DIAGNOSIS — C099 Malignant neoplasm of tonsil, unspecified: Secondary | ICD-10-CM | POA: Diagnosis not present

## 2020-01-17 DIAGNOSIS — Z79899 Other long term (current) drug therapy: Secondary | ICD-10-CM | POA: Diagnosis not present

## 2020-01-17 DIAGNOSIS — R070 Pain in throat: Secondary | ICD-10-CM | POA: Diagnosis not present

## 2020-01-17 DIAGNOSIS — I700 Atherosclerosis of aorta: Secondary | ICD-10-CM | POA: Insufficient documentation

## 2020-01-17 DIAGNOSIS — G893 Neoplasm related pain (acute) (chronic): Secondary | ICD-10-CM | POA: Diagnosis not present

## 2020-01-17 MED ORDER — SODIUM CHLORIDE 0.9 % IV SOLN
Freq: Once | INTRAVENOUS | Status: AC
  Filled 2020-01-17: qty 250

## 2020-01-17 MED ORDER — HEPARIN SOD (PORK) LOCK FLUSH 100 UNIT/ML IV SOLN
500.0000 [IU] | Freq: Once | INTRAVENOUS | Status: AC | PRN
  Administered 2020-01-17: 500 [IU] via INTRAVENOUS
  Filled 2020-01-17: qty 5

## 2020-01-17 MED ORDER — SODIUM CHLORIDE 0.9% FLUSH
10.0000 mL | Freq: Once | INTRAVENOUS | Status: AC
  Administered 2020-01-17: 10 mL
  Filled 2020-01-17: qty 10

## 2020-01-17 NOTE — Assessment & Plan Note (Signed)
He appears profoundly dehydrated I recommend IV fluids support today I recommend soft food and slow hydration and increase oral fluid intake as tolerated over the next few days

## 2020-01-17 NOTE — Patient Instructions (Signed)

## 2020-01-17 NOTE — Assessment & Plan Note (Signed)
His oral examination is unremarkable but he has significant pain down his throat and the side of his neck He is at high risk of cancer recurrence I recommend ordering CT scan of the neck, chest abdomen and pelvis for further evaluation and rule out cancer recurrence

## 2020-01-17 NOTE — Progress Notes (Signed)
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  We discussed briefly about narcotic refill policy  Dehydration He appears profoundly dehydrated I recommend IV fluids support today I recommend soft food and slow hydration and increase oral fluid intake as tolerated over the next few days   Orders Placed This Encounter  Procedures  . CT Soft Tissue Neck Wo Contrast    Neck pain, hx tonsil cancer, dysphagia, concern for recurrence    Standing Status:   Future    Standing Expiration Date:   01/16/2021    Order Specific Question:   Preferred imaging location?    Answer:   [facility], [place]    All questions were answered. The patient knows to call the clinic with any problems, questions or concerns. The total time spent in the appointment was 25 minutes encounter with patients including review of chart and various tests results, discussions about plan of care and coordination of care plan   [name], MD 01/17/2020 2:09 PM  INTERVAL HISTORY: Please see below for problem oriented charting. He is seen urgently Approximately a week ago, he was doing a lot of physical activity He  thinks he might not be drinking enough fluids Soon after that, he has difficulty swallowing his own saliva and started to have significant gagging with nausea and vomiting for 2 days He tried to take Mucinex that seems to help a little bit He started to have severe throat pain and difficulty swallowing food and pills He has occasional dysphagia He took some antiemetics that seems to help He is able to swallow some soft diet yesterday and gently swallow his pain medicine He has been drinking Gatorade and other liquids but might not have been drinking much water He complained of acute on chronic pain on the right side of his neck  SUMMARY OF ONCOLOGIC HISTORY: Oncology History Overview Note  Tonsillar cancer, HPV positive   Primary site: Pharynx - Oropharynx (Right)   Staging method: AJCC 7th Edition   Clinical: Stage IVA (T2, N2b, M0) signed by [name], MD on 08/19/2013  1:24 PM   Summary: Stage IVA (T2, N2b, M0)     Tonsillar cancer ([place])  07/09/2013 Procedure   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 FNA of right level III lymph node was inconclusive for cancer   07/25/2013 Imaging   PET scan showed locally advanced disease with abnormal lymphadenopathy in the right axilla   08/06/2013 Procedure   CT-guided biopsy of the lymphadenopathy was negative for malignancy   08/15/2013 Surgery   Patient has placement of port and feeding tube   08/19/2013 - 09/10/2013 Chemotherapy   Patient received chemotherapy with cisplatin. The patient only received 2 doses due to uncontrolled nausea and acute  renal failure.   08/19/2013 - 10/15/2013 Radiation Therapy   Received Helical IMRT Tomotherapy:  Right Tonstil and bilateral neck / 70 Gy in 35 fractions to gross disease, 63 Gy in 35 fractions to high risk nodal echelons, and 56 Gy in 35 fractions to intermediate risk nodal echelons.   08/27/2013 - 08/30/2013 Hospital Admission   The patient was admitted to  the hospital for uncontrolled nausea vomiting and dehydration.   02/14/2014 Imaging   PET/CT scan showed complete response to treatment   03/19/2014 Surgery   He had excisional lymph node biopsy from the right neck. Pathology was benign   05/13/2014 Surgery   He had removal of Port-A-Cath.   05/22/2014 Imaging   Repeat CT scan of the neck show no evidence of disease recurrence.   12/09/2014 Imaging   Ct neck without contrast showed persistent abnormalities on the right side of neck, indeterminate   12/25/2014 Imaging   PET CT scan showed disease recurrence.   02/10/2015 Procedure   He has placement of port   02/13/2015 - 07/13/2015 Chemotherapy   He received chemotherapy with carbo/Taxol   04/21/2015 Imaging   PET CT scan showed improved disease control   08/04/2015 Imaging   PET scan showed persistent disease   08/17/2015 -  Chemotherapy   He was started with [facility]   10/19/2015 Imaging   Ct neck showed mass-like intermediate density soft tissue at the right lateral neck recurrence site stable.    02/26/2016 Imaging   CT neck showed unchanged appearance of right neck recurrence compared to 10/19/2015 CT. No noncontrast evidence of new metastatic disease in the neck. Chronic sinusitis, progressed.   02/26/2016 Imaging   Diffuse but patchy and asymmetric partial airspace filling process (ground-glass opacity) in the lungs. This could be due to respiratory bronchiolitis, hypersensitivity pneumonitis or possible drug reaction. Atypical/viral pneumonia is also possible. Pulmonary consultation may be a helpful. A three-month follow-up noncontrast chest CT is suggested. Slight interval enlargement of mediastinal lymph nodes and a small lymph node along the left major fissure. This is most likely due to the inflammatory process in the lungs. No findings for metastatic disease involving the chest. No findings for upper abdominal metastatic disease.   02/29/2016 Adverse Reaction   His  treatment is placed on hold due to possible hypersensitivity pneumonitis/drug reaction   04/14/2016 Imaging   Ct chest showed no evidence for metastatic disease within the chest. Significant interval improvement and near complete resolution of previously described diffuse bilateral predominately ground-glass pulmonary opacities, most compatible with resolving infectious/inflammatory process.   09/05/2016 Imaging   CT scan of neck and chest  1. Unchanged appearance of right neck recurrence. 2. No evidence of new metastatic disease in the neck. 3. Unremarkable and stable CT appearance of the chest. No findings suspicious for metastatic disease   07/24/2017 Imaging   1. No findings suspicious for metastatic disease in the chest. 2. No acute consolidative airspace disease to suggest a pneumonia. 3. No appreciable change in chronic mild patchy upper lung predominant centrilobular micronodularity. If the patient is a current smoker, these findings are most compatible with smoking related interstitial lung disease. If the patient is not a current smoker, these findings suggest subacute hypersensitivity pneumonitis  or postinflammatory change. 4. Stable mild biapical radiation fibrosis. 5. Mild to moderate centrilobular emphysema with diffuse bronchial wall thickening, suggesting COPD. 6. One vessel coronary atherosclerosis.  Aortic Atherosclerosis (ICD10-I70.0) and Emphysema (ICD10-J43.9).   09/15/2017 Imaging   1. No evidence of interstitial  lung disease. 2. Emphysema (ICD10-J43.9).   10/29/2017 Imaging   1. Moderate quality exam for pulmonary embolism with primary limitation of motion. No embolism identified. 2. Findings of congestive heart failure, including bilateral pleural effusions and septal thickening. 3. New thoracic adenopathy since approximately 6 weeks ago. Favor secondary to fluid overload/congestive heart failure. 4. New left apical 4 mm pulmonary nodule. Favored to represent a  subpleural lymph node. Non-contrast chest CT can be considered in 12 months, given risk factors for primary bronchogenic carcinoma. This recommendation follows the consensus statement: Guidelines for Management of Incidental Pulmonary Nodules Detected on CT Images: From the Fleischner Society 2017; Radiology 2017; 700:174-944.    10/29/2017 - 11/02/2017 Hospital Admission   He was admitted to the hospital due to shortness of breath and was found to have congestive heart failure.   10/30/2017 Imaging   Definity used; severe global reduction in LV systolic function; severe LVE; restrictive filling; mild MR; mild LAE; mild RVE with moderate RV dysfunction; mild TR with moderate pulmonary hypertension.   01/26/2018 Imaging   1. Regression of soft tissue in the postoperative right neck favoring scarring. No new or progressive finding to suggest recurrent disease. 2. Chest CT reported separately   01/26/2018 Imaging   1. No evidence of thoracic metastasis. 2. Port in the anterior chest wall with tip in distal    03/12/2018 Imaging   1. Trace, silent aspiration noted after a swallow consistent with aspiration of residual barium in the hypopharynx. 2. Retrograde reflux of contrast into the posterior nasopharynx with swallowing. 3. No gross esophageal abnormality 4. Initial difficulty swallowing a 13 mm barium tablet that than passes readily into the stomach once it is swallowed.   05/31/2018 Imaging   Resolution of previously seen mucosal hyperenhancement in the pharynx and larynx. Otherwise unchanged examination of the neck without evidence of recurrent disease or cervical nodal metastases.    06/07/2018 Echocardiogram   1. The left ventricle has moderately reduced systolic function, with an ejection fraction of 35-40%. The cavity size was severely dilated. Left ventricular diastolic Doppler parameters are consistent with impaired relaxation Left ventricular diffuse hypokinesis.  2. The right  ventricle has normal systolic function. The cavity was moderately enlarged. There is no increase in right ventricular wall thickness.  3. The mitral valve is normal in structure.  4. The tricuspid valve is normal in structure.  5. The aortic valve is normal in structure.  6. The pulmonic valve was normal in structure.  7. The inferior vena cava was dilated in size with >50% respiratory variability.     REVIEW OF SYSTEMS:   Constitutional: Denies fevers, chills or abnormal weight loss Eyes: Denies blurriness of vision Ears, nose, mouth, throat, and face: Denies mucositis or sore throat Respiratory: Denies cough, dyspnea or wheezes Cardiovascular: Denies palpitation, chest discomfort or lower extremity swelling Skin: Denies abnormal skin rashes Lymphatics: Denies new lymphadenopathy or easy bruising Neurological:Denies numbness, tingling or new weaknesses Behavioral/Psych: Mood is stable, no new changes  All other systems were reviewed with the patient and are negative.  I have reviewed the past medical history, past surgical history, social history and family history with the patient and they are unchanged from previous note.  ALLERGIES:  is allergic to bee pollen and pollen extract.  MEDICATIONS:  Current Outpatient Medications  Medication Sig Dispense Refill  . albuterol (PROVENTIL HFA;VENTOLIN HFA) 108 (90 Base) MCG/ACT inhaler Inhale 2 puffs into the lungs every 6 (six) hours as needed for wheezing or shortness of  breath. 1 Inhaler 6  . aspirin EC 81 MG tablet Take 81 mg by mouth daily.    . fluticasone (FLONASE) 50 MCG/ACT nasal spray Place 2 sprays into both nostrils daily. 16 g 2  . isosorbide mononitrate (IMDUR) 30 MG 24 hr tablet Take 1 tablet (30 mg total) by mouth daily. 90 tablet 3  . levothyroxine (SYNTHROID) 50 MCG tablet Take 1 tablet (50 mcg total) by mouth daily before breakfast. 90 tablet 1  . lidocaine-prilocaine (EMLA) cream Apply 1 application topically as needed  (port). 30 g 11  . metoprolol succinate (TOPROL-XL) 50 MG 24 hr tablet Take 1 tablet (50 mg total) by mouth daily. Take with or immediately following a meal. 90 tablet 3  . ondansetron (ZOFRAN) 8 MG tablet Take 1 tablet (8 mg total) by mouth every 8 (eight) hours as needed for nausea or vomiting. 30 tablet 0  . oxyCODONE (ROXICODONE) 15 MG immediate release tablet Take 1 tablet (15 mg total) by mouth every 6 (six) hours as needed. 90 tablet 0  . sacubitril-valsartan (ENTRESTO) 24-26 MG Take 1 tablet by mouth 2 (two) times daily. 60 tablet 6   No current facility-administered medications for this visit.   Facility-Administered Medications Ordered in Other Visits  Medication Dose Route Frequency Provider Last Rate Last Admin  . heparin lock flush 100 unit/mL  500 Units Intravenous Once PRN Adonias Demore, MD      . sodium chloride 0.9 % injection 10 mL  10 mL Intravenous PRN Alvy Bimler, Katyana Trolinger, MD   10 mL at 11/29/16 0818  . sodium chloride 0.9 % injection 10 mL  10 mL Intravenous PRN Alvy Bimler, Deshawn Witty, MD      . sodium chloride flush (NS) 0.9 % injection 10 mL  10 mL Intracatheter Once Alvy Bimler, Billijo Dilling, MD        PHYSICAL EXAMINATION: ECOG PERFORMANCE STATUS: 1 - Symptomatic but completely ambulatory  Vitals:   01/17/20 1249  BP: (!) 103/54  Pulse: 91  Resp: 18  Temp: 98.5 F (36.9 C)  SpO2: 100%   Filed Weights   01/17/20 1249  Weight: 222 lb 3.2 oz (100.8 kg)    GENERAL:alert, no distress and comfortable SKIN: skin color, texture, turgor are normal, no rashes or significant lesions EYES: normal, Conjunctiva are pink and non-injected, sclera clear OROPHARYNX:no exudate, no erythema and lips, buccal mucosa, and tongue normal  NECK: Neck fibrosis from prior radiation is stable LYMPH:  no palpable lymphadenopathy in the cervical, axillary or inguinal LUNGS: clear to auscultation and percussion with normal breathing effort HEART: regular rate & rhythm and no murmurs and no lower extremity  edema ABDOMEN:abdomen soft, non-tender and normal bowel sounds Musculoskeletal:no cyanosis of digits and no clubbing  NEURO: alert & oriented x 3 with fluent speech, no focal motor/sensory deficits  LABORATORY DATA:  I have reviewed the data as listed    Component Value Date/Time   NA 138 01/01/2020 1127   NA 141 04/20/2017 1028   K 4.1 01/01/2020 1127   K 4.3 04/20/2017 1028   CL 104 01/01/2020 1127   CO2 30 01/01/2020 1127   CO2 26 04/20/2017 1028   GLUCOSE 70 01/01/2020 1127   GLUCOSE 96 04/20/2017 1028   BUN 12 01/01/2020 1127   BUN 16.7 04/20/2017 1028   CREATININE 1.56 (H) 01/01/2020 1127   CREATININE 2.0 (H) 04/20/2017 1028   CALCIUM 9.5 01/01/2020 1127   CALCIUM 9.0 04/20/2017 1028   PROT 7.0 01/01/2020 1127   PROT 7.0 04/20/2017 1028  ALBUMIN 3.8 01/01/2020 1127   ALBUMIN 4.0 04/20/2017 1028   AST 91 (H) 01/01/2020 1127   AST 20 04/20/2017 1028   ALT 58 (H) 01/01/2020 1127   ALT 17 04/20/2017 1028   ALKPHOS 62 01/01/2020 1127   ALKPHOS 61 04/20/2017 1028   BILITOT 0.7 01/01/2020 1127   BILITOT 0.49 04/20/2017 1028   GFRNONAA 50 (L) 01/01/2020 1127   GFRAA 58 (L) 01/01/2020 1127    No results found for: SPEP, UPEP  Lab Results  Component Value Date   WBC 5.3 01/01/2020   NEUTROABS 3.9 01/01/2020   HGB 12.5 (L) 01/01/2020   HCT 37.4 (L) 01/01/2020   MCV 103.0 (H) 01/01/2020   PLT 176 01/01/2020      Chemistry      Component Value Date/Time   NA 138 01/01/2020 1127   NA 141 04/20/2017 1028   K 4.1 01/01/2020 1127   K 4.3 04/20/2017 1028   CL 104 01/01/2020 1127   CO2 30 01/01/2020 1127   CO2 26 04/20/2017 1028   BUN 12 01/01/2020 1127   BUN 16.7 04/20/2017 1028   CREATININE 1.56 (H) 01/01/2020 1127   CREATININE 2.0 (H) 04/20/2017 1028      Component Value Date/Time   CALCIUM 9.5 01/01/2020 1127   CALCIUM 9.0 04/20/2017 1028   ALKPHOS 62 01/01/2020 1127   ALKPHOS 61 04/20/2017 1028   AST 91 (H) 01/01/2020 1127   AST 20 04/20/2017 1028    ALT 58 (H) 01/01/2020 1127   ALT 17 04/20/2017 1028   BILITOT 0.7 01/01/2020 1127   BILITOT 0.49 04/20/2017 1028

## 2020-01-17 NOTE — Assessment & Plan Note (Signed)
He has multifactorial pain, 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  We discussed briefly about narcotic refill policy

## 2020-01-21 ENCOUNTER — Other Ambulatory Visit: Payer: Self-pay | Admitting: Hematology and Oncology

## 2020-01-21 ENCOUNTER — Other Ambulatory Visit: Payer: Self-pay

## 2020-01-21 DIAGNOSIS — C099 Malignant neoplasm of tonsil, unspecified: Secondary | ICD-10-CM

## 2020-01-21 MED ORDER — LIDOCAINE-PRILOCAINE 2.5-2.5 % EX CREA: 1.0000 "application " | TOPICAL_CREAM | CUTANEOUS | 11 refills | Status: DC | PRN

## 2020-01-21 MED ORDER — OXYCODONE HCL 15 MG PO TABS: 15.0000 mg | ORAL_TABLET | Freq: Four times a day (QID) | ORAL | 0 refills | Status: DC | PRN

## 2020-01-21 MED FILL — LIDOCAINE-PRILOCAINE CREAM: 2.5-2.5 | 30 days supply | Qty: 30 | Fill #0

## 2020-01-21 MED FILL — oxyCODONE HCL 15 MG TABS: 15 | 23 days supply | Qty: 90 | Fill #0

## 2020-01-21 NOTE — Telephone Encounter (Signed)
Called pt to inform him that pain medication and emla cream has been refilled per his request. Pt verbalized understanding and agrees to call with any further concerns.

## 2020-01-23 ENCOUNTER — Other Ambulatory Visit: Payer: Self-pay | Admitting: Hematology and Oncology

## 2020-01-23 DIAGNOSIS — C090 Malignant neoplasm of tonsillar fossa: Secondary | ICD-10-CM

## 2020-01-23 DIAGNOSIS — C099 Malignant neoplasm of tonsil, unspecified: Secondary | ICD-10-CM

## 2020-01-24 ENCOUNTER — Ambulatory Visit (HOSPITAL_COMMUNITY): Admission: RE | Admit: 2020-01-24 | Payer: Medicaid Other | Source: Ambulatory Visit

## 2020-01-27 ENCOUNTER — Telehealth: Payer: Self-pay

## 2020-01-27 MED FILL — METOPROLOL SUCCINATE ER 50: 50 | 90 days supply | Qty: 90 | Fill #3

## 2020-01-27 NOTE — Telephone Encounter (Signed)
Called regarding missed CT appt. He will call and reschedule and then call the office back. He missed appt due to a death in the family.

## 2020-01-28 ENCOUNTER — Ambulatory Visit: Payer: Medicaid Other | Admitting: Hematology and Oncology

## 2020-01-31 ENCOUNTER — Telehealth: Payer: Self-pay

## 2020-01-31 NOTE — Telephone Encounter (Signed)
Called and left a message. Scheduled lab at 0730 and flush appt at 0800 scheduled prior to scan appt on 10/26. Scheduled appt with Dr. Alvy Bimler on 10/27 at 1130. Ask him to call the office back if appts need to be changed.

## 2020-02-11 ENCOUNTER — Inpatient Hospital Stay: Payer: Medicaid Other

## 2020-02-11 ENCOUNTER — Ambulatory Visit (HOSPITAL_COMMUNITY): Payer: Medicaid Other

## 2020-02-12 ENCOUNTER — Inpatient Hospital Stay: Payer: Medicaid Other | Admitting: Hematology and Oncology

## 2020-02-12 MED FILL — ENTRESTO 24 MG-26 MG TABLET: 24-26 | 30 days supply | Qty: 60 | Fill #6

## 2020-02-17 ENCOUNTER — Telehealth: Payer: Self-pay | Admitting: Hematology and Oncology

## 2020-02-17 NOTE — Telephone Encounter (Signed)
Called pt per 10/29 sch msg - no answer. Left message with appt date and time

## 2020-02-19 ENCOUNTER — Other Ambulatory Visit: Payer: Self-pay | Admitting: Hematology and Oncology

## 2020-02-19 DIAGNOSIS — C099 Malignant neoplasm of tonsil, unspecified: Secondary | ICD-10-CM

## 2020-02-19 MED ORDER — OXYCODONE HCL 15 MG PO TABS: 15.0000 mg | ORAL_TABLET | Freq: Four times a day (QID) | ORAL | 0 refills | Status: DC | PRN

## 2020-02-19 MED FILL — oxyCODONE HCL 15 MG TABS: 15 | 22 days supply | Qty: 90 | Fill #0

## 2020-02-24 ENCOUNTER — Ambulatory Visit (HOSPITAL_COMMUNITY)
Admission: RE | Admit: 2020-02-24 | Discharge: 2020-02-24 | Disposition: A | Discharge status: Home / Self Care | Payer: Medicaid Other | Source: Ambulatory Visit | Attending: Hematology and Oncology | Admitting: Hematology and Oncology

## 2020-02-24 ENCOUNTER — Telehealth: Payer: Self-pay

## 2020-02-24 ENCOUNTER — Inpatient Hospital Stay: Payer: Medicaid Other | Attending: Hematology and Oncology

## 2020-02-24 ENCOUNTER — Other Ambulatory Visit: Payer: Self-pay

## 2020-02-24 ENCOUNTER — Inpatient Hospital Stay: Payer: Medicaid Other

## 2020-02-24 DIAGNOSIS — C090 Malignant neoplasm of tonsillar fossa: Secondary | ICD-10-CM | POA: Insufficient documentation

## 2020-02-24 DIAGNOSIS — Z79899 Other long term (current) drug therapy: Secondary | ICD-10-CM | POA: Insufficient documentation

## 2020-02-24 DIAGNOSIS — I2510 Atherosclerotic heart disease of native coronary artery without angina pectoris: Secondary | ICD-10-CM | POA: Diagnosis not present

## 2020-02-24 DIAGNOSIS — Z5111 Encounter for antineoplastic chemotherapy: Secondary | ICD-10-CM

## 2020-02-24 DIAGNOSIS — R112 Nausea with vomiting, unspecified: Secondary | ICD-10-CM

## 2020-02-24 DIAGNOSIS — J701 Chronic and other pulmonary manifestations due to radiation: Secondary | ICD-10-CM | POA: Insufficient documentation

## 2020-02-24 DIAGNOSIS — I700 Atherosclerosis of aorta: Secondary | ICD-10-CM | POA: Insufficient documentation

## 2020-02-24 DIAGNOSIS — M47812 Spondylosis without myelopathy or radiculopathy, cervical region: Secondary | ICD-10-CM | POA: Insufficient documentation

## 2020-02-24 DIAGNOSIS — C099 Malignant neoplasm of tonsil, unspecified: Secondary | ICD-10-CM | POA: Insufficient documentation

## 2020-02-24 DIAGNOSIS — J90 Pleural effusion, not elsewhere classified: Secondary | ICD-10-CM | POA: Insufficient documentation

## 2020-02-24 DIAGNOSIS — Z95828 Presence of other vascular implants and grafts: Secondary | ICD-10-CM

## 2020-02-24 DIAGNOSIS — N62 Hypertrophy of breast: Secondary | ICD-10-CM | POA: Diagnosis not present

## 2020-02-24 DIAGNOSIS — K760 Fatty (change of) liver, not elsewhere classified: Secondary | ICD-10-CM | POA: Diagnosis not present

## 2020-02-24 DIAGNOSIS — R748 Abnormal levels of other serum enzymes: Secondary | ICD-10-CM | POA: Diagnosis not present

## 2020-02-24 DIAGNOSIS — R55 Syncope and collapse: Secondary | ICD-10-CM | POA: Insufficient documentation

## 2020-02-24 LAB — CMP (CANCER CENTER ONLY)
ALT: 50 U/L — ABNORMAL HIGH (ref 0–44)
AST: 67 U/L — ABNORMAL HIGH (ref 15–41)
Albumin: 4.1 g/dL (ref 3.5–5.0)
Alkaline Phosphatase: 59 U/L (ref 38–126)
Anion gap: 9 (ref 5–15)
BUN: 14 mg/dL (ref 6–20)
CO2: 30 mmol/L (ref 22–32)
Calcium: 9 mg/dL (ref 8.9–10.3)
Chloride: 103 mmol/L (ref 98–111)
Creatinine: 1.58 mg/dL — ABNORMAL HIGH (ref 0.61–1.24)
GFR, Estimated: 52 mL/min — ABNORMAL LOW (ref >60)
Glucose, Bld: 99 mg/dL (ref 70–99)
Potassium: 3.8 mmol/L (ref 3.5–5.1)
Sodium: 142 mmol/L (ref 135–145)
Total Bilirubin: 0.6 mg/dL (ref 0.3–1.2)
Total Protein: 7.3 g/dL (ref 6.5–8.1)

## 2020-02-24 LAB — CBC WITH DIFFERENTIAL/PLATELET
Abs Immature Granulocytes: 0.03 10*3/uL (ref 0.00–0.07)
Basophils Absolute: 0 10*3/uL (ref 0.0–0.1)
Basophils Relative: 0 %
Eosinophils Absolute: 0 10*3/uL (ref 0.0–0.5)
Eosinophils Relative: 0 %
HCT: 36.6 % — ABNORMAL LOW (ref 39.0–52.0)
Hemoglobin: 12.7 g/dL — ABNORMAL LOW (ref 13.0–17.0)
Immature Granulocytes: 1 %
Lymphocytes Relative: 8 %
Lymphs Abs: 0.5 10*3/uL — ABNORMAL LOW (ref 0.7–4.0)
MCH: 35.2 pg — ABNORMAL HIGH (ref 26.0–34.0)
MCHC: 34.7 g/dL (ref 30.0–36.0)
MCV: 101.4 fL — ABNORMAL HIGH (ref 80.0–100.0)
Monocytes Absolute: 0.7 10*3/uL (ref 0.1–1.0)
Monocytes Relative: 11 %
Neutro Abs: 4.9 10*3/uL (ref 1.7–7.7)
Neutrophils Relative %: 80 %
Platelets: 204 10*3/uL (ref 150–400)
RBC: 3.61 MIL/uL — ABNORMAL LOW (ref 4.22–5.81)
RDW: 14.1 % (ref 11.5–15.5)
WBC: 6.1 10*3/uL (ref 4.0–10.5)
nRBC: 0 % (ref 0.0–0.2)

## 2020-02-24 MED ORDER — HEPARIN SOD (PORK) LOCK FLUSH 100 UNIT/ML IV SOLN
500.0000 [IU] | Freq: Once | INTRAVENOUS | Status: AC
  Administered 2020-02-24: 500 [IU] via INTRAVENOUS

## 2020-02-24 MED ORDER — IOHEXOL 300 MG/ML  SOLN
100.0000 mL | Freq: Once | INTRAMUSCULAR | Status: AC | PRN
  Administered 2020-02-24: 100 mL via INTRAVENOUS

## 2020-02-24 MED ORDER — SODIUM CHLORIDE 0.9% FLUSH
10.0000 mL | Freq: Once | INTRAVENOUS | Status: AC
  Administered 2020-02-24: 10 mL
  Filled 2020-02-24: qty 10

## 2020-02-24 MED ORDER — HEPARIN SOD (PORK) LOCK FLUSH 100 UNIT/ML IV SOLN
INTRAVENOUS | Status: AC
  Filled 2020-02-24: qty 5

## 2020-02-24 NOTE — Telephone Encounter (Signed)
He called and requested flush appt prior to scan. Given appt times. Requesting a copy of today's schedule. Copy of today's schedule left out front.

## 2020-02-24 NOTE — Patient Instructions (Signed)

## 2020-02-25 ENCOUNTER — Encounter: Payer: Self-pay | Admitting: Hematology and Oncology

## 2020-02-25 ENCOUNTER — Inpatient Hospital Stay (HOSPITAL_BASED_OUTPATIENT_CLINIC_OR_DEPARTMENT_OTHER): Payer: Medicaid Other | Admitting: Hematology and Oncology

## 2020-02-25 ENCOUNTER — Other Ambulatory Visit: Payer: Self-pay

## 2020-02-25 DIAGNOSIS — Z7189 Other specified counseling: Secondary | ICD-10-CM

## 2020-02-25 DIAGNOSIS — C090 Malignant neoplasm of tonsillar fossa: Secondary | ICD-10-CM | POA: Diagnosis not present

## 2020-02-25 DIAGNOSIS — C099 Malignant neoplasm of tonsil, unspecified: Secondary | ICD-10-CM | POA: Diagnosis not present

## 2020-02-25 DIAGNOSIS — R748 Abnormal levels of other serum enzymes: Secondary | ICD-10-CM

## 2020-02-25 LAB — TSH: TSH: 3.444 u[IU]/mL (ref 0.320–4.118)

## 2020-02-25 NOTE — Assessment & Plan Note (Signed)
The patient shared with me that [facility] will [facility] We will coordinate care when I see him back next month

## 2020-02-25 NOTE — Assessment & Plan Note (Signed)
CT imaging show no evidence of liver metastasis The cause of elevated liver enzymes is due to fatty liver disease We discussed the importance of dietary modification and weight loss

## 2020-02-25 NOTE — Assessment & Plan Note (Signed)
Thankfully, CT imaging show no evidence of cancer recurrence I will see him again next month for port flush and coordination of long-term care

## 2020-02-25 NOTE — Progress Notes (Signed)
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   All questions were answered. The patient knows to call the clinic with any problems, questions or concerns. The total time spent in the appointment was 20 minutes encounter with patients including review of chart and various tests results, discussions about plan of care and coordination of care plan   [name], MD 02/25/2020 3:23 PM  INTERVAL HISTORY: Please see below for problem oriented charting. 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 FNA of right level III lymph node was inconclusive for cancer   07/25/2013 Imaging   PET scan showed locally advanced disease with abnormal lymphadenopathy in the right axilla   08/06/2013 Procedure   CT-guided biopsy of the lymphadenopathy was negative for malignancy   08/15/2013 Surgery   Patient has placement of port and feeding tube   08/19/2013 - 09/10/2013 Chemotherapy   Patient received chemotherapy with cisplatin. The patient only received 2 doses due to uncontrolled nausea and acute renal failure.   08/19/2013 - 10/15/2013 Radiation Therapy   Received Helical IMRT Tomotherapy:  Right Tonstil and bilateral neck / 70 Gy in 35 fractions to gross disease, 63 Gy in 35 fractions to high risk nodal echelons, and 56 Gy in 35 fractions to intermediate risk nodal echelons.   08/27/2013 - 08/30/2013 Hospital Admission   The patient was admitted to the hospital for uncontrolled nausea vomiting and dehydration.   02/14/2014 Imaging   PET/CT scan showed complete response to treatment   03/19/2014 Surgery   He had excisional lymph node biopsy from the right neck. Pathology was benign   05/13/2014 Surgery   He had removal of Port-A-Cath.   05/22/2014 Imaging   Repeat CT scan of the neck show no evidence of disease recurrence.   12/09/2014 Imaging   Ct neck without contrast showed persistent abnormalities on the right side of neck, indeterminate   12/25/2014 Imaging   PET CT scan showed  disease recurrence.   02/10/2015 Procedure   He has placement of port   02/13/2015 - 07/13/2015 Chemotherapy   He received chemotherapy with carbo/Taxol   04/21/2015 Imaging   PET CT scan showed improved disease control   08/04/2015 Imaging   PET scan showed persistent disease   08/17/2015 -  Chemotherapy   He was started with [facility]   10/19/2015 Imaging    Ct neck showed mass-like intermediate density soft tissue at the right lateral neck recurrence site stable.    02/26/2016 Imaging   CT neck showed unchanged appearance of right neck recurrence compared to 10/19/2015 CT. No noncontrast evidence of new metastatic disease in the neck. Chronic sinusitis, progressed.   02/26/2016 Imaging   Diffuse but patchy and asymmetric partial airspace filling process (ground-glass opacity) in the lungs. This could be due to respiratory bronchiolitis, hypersensitivity pneumonitis or possible drug reaction. Atypical/viral pneumonia is also possible. Pulmonary consultation may be a helpful. A three-month follow-up noncontrast chest CT is suggested. Slight interval enlargement of mediastinal lymph nodes and a small lymph node along the left major fissure. This is most likely due to the inflammatory process in the lungs. No findings for metastatic disease involving the chest. No findings for upper abdominal metastatic disease.   02/29/2016 Adverse Reaction   His treatment is placed on hold due to possible hypersensitivity pneumonitis/drug reaction   04/14/2016 Imaging   Ct chest showed no evidence for metastatic disease within the chest. Significant interval improvement and near complete resolution of previously described diffuse bilateral predominately ground-glass pulmonary opacities, most compatible with resolving infectious/inflammatory process.   09/05/2016 Imaging   CT scan of neck and chest  1. Unchanged appearance of right neck recurrence. 2. No evidence of new metastatic disease in the neck. 3. Unremarkable and stable CT appearance of the chest. No findings suspicious for metastatic disease   07/24/2017 Imaging   1. No findings suspicious for metastatic disease in the chest. 2. No acute consolidative airspace disease to suggest a pneumonia. 3. No appreciable change in chronic mild patchy upper lung predominant centrilobular micronodularity. If the patient is  a current smoker, these findings are most compatible with smoking related interstitial lung disease. If the patient is not a current smoker, these findings suggest subacute hypersensitivity pneumonitis  or postinflammatory change. 4. Stable mild biapical radiation fibrosis. 5. Mild to moderate centrilobular emphysema with diffuse bronchial wall thickening, suggesting COPD. 6. One vessel coronary atherosclerosis.  Aortic Atherosclerosis (ICD10-I70.0) and Emphysema (ICD10-J43.9).   09/15/2017 Imaging   1. No evidence of interstitial lung disease. 2. Emphysema (ICD10-J43.9).   10/29/2017 Imaging   1. Moderate quality exam for pulmonary embolism with primary limitation of motion. No embolism identified. 2. Findings of congestive heart failure, including bilateral pleural effusions and septal thickening. 3. New thoracic adenopathy since approximately 6 weeks ago. Favor secondary to fluid overload/congestive heart failure. 4. New left apical 4 mm pulmonary nodule. Favored to represent a subpleural lymph node. Non-contrast chest CT can be considered in 12 months, given risk factors for primary bronchogenic carcinoma. This recommendation follows the consensus statement: Guidelines for Management of Incidental Pulmonary Nodules Detected on CT Images: From the Fleischner Society 2017; Radiology 2017; 810:175-102.    10/29/2017 - 11/02/2017 Hospital Admission   He was admitted to the hospital due to shortness of breath and was found to have congestive heart failure.   10/30/2017 Imaging   Definity used; severe global reduction in LV systolic function; severe LVE; restrictive filling; mild MR; mild LAE; mild  RVE with moderate RV dysfunction; mild TR with moderate pulmonary hypertension.   01/26/2018 Imaging   1. Regression of soft tissue in the postoperative right neck favoring scarring. No new or progressive finding to suggest recurrent disease. 2. Chest CT reported separately   01/26/2018 Imaging    1. No evidence of thoracic metastasis. 2. Port in the anterior chest wall with tip in distal    03/12/2018 Imaging   1. Trace, silent aspiration noted after a swallow consistent with aspiration of residual barium in the hypopharynx. 2. Retrograde reflux of contrast into the posterior nasopharynx with swallowing. 3. No gross esophageal abnormality 4. Initial difficulty swallowing a 13 mm barium tablet that than passes readily into the stomach once it is swallowed.   05/31/2018 Imaging   Resolution of previously seen mucosal hyperenhancement in the pharynx and larynx. Otherwise unchanged examination of the neck without evidence of recurrent disease or cervical nodal metastases.    06/07/2018 Echocardiogram   1. The left ventricle has moderately reduced systolic function, with an ejection fraction of 35-40%. The cavity size was severely dilated. Left ventricular diastolic Doppler parameters are consistent with impaired relaxation Left ventricular diffuse hypokinesis.  2. The right ventricle has normal systolic function. The cavity was moderately enlarged. There is no increase in right ventricular wall thickness.  3. The mitral valve is normal in structure.  4. The tricuspid valve is normal in structure.  5. The aortic valve is normal in structure.  6. The pulmonic valve was normal in structure.  7. The inferior vena cava was dilated in size with >50% respiratory variability.   02/24/2020 Imaging   Ct neck Postsurgical changes of the right side of the neck. No evidence of recurrent disease. No lymphadenopathy   02/24/2020 Imaging   1. No evidence of metastatic disease in the chest, abdomen, or pelvis. 2. Innumerable tiny centrilobular pulmonary nodules, most concentrated in the upper lobes, likely reflecting smoking-related respiratory bronchiolitis. 3. Hepatic steatosis. 4. Aortic Atherosclerosis (ICD10-I70.0).       REVIEW OF SYSTEMS:   Constitutional: Denies fevers, chills or  abnormal weight loss Eyes: Denies blurriness of vision Ears, nose, mouth, throat, and face: Denies mucositis or sore throat Respiratory: Denies cough, dyspnea or wheezes Cardiovascular: Denies palpitation, chest discomfort or lower extremity swelling Gastrointestinal:  Denies nausea, heartburn or change in bowel habits Skin: Denies abnormal skin rashes Lymphatics: Denies new lymphadenopathy or easy bruising Neurological:Denies numbness, tingling or new weaknesses Behavioral/Psych: Mood is stable, no new changes  All other systems were reviewed with the patient and are negative.  I have reviewed the past medical history, past surgical history, social history and family history with the patient and they are unchanged from previous note.  ALLERGIES:  is allergic to bee pollen and pollen extract.  MEDICATIONS:  Current Outpatient Medications  Medication Sig Dispense Refill  . albuterol (PROVENTIL HFA;VENTOLIN HFA) 108 (90 Base) MCG/ACT inhaler Inhale 2 puffs into the lungs every 6 (six) hours as needed for wheezing or shortness of breath. 1 Inhaler 6  . aspirin EC 81 MG tablet Take 81 mg by mouth daily.    . fluticasone (FLONASE) 50 MCG/ACT nasal spray Place 2 sprays into both nostrils daily. 16 g 2  . isosorbide mononitrate (IMDUR) 30 MG 24 hr tablet Take 1 tablet (30 mg total) by mouth daily. 90 tablet 3  . levothyroxine (SYNTHROID) 50 MCG tablet Take 1 tablet (50 mcg total) by mouth daily before breakfast. 90 tablet 1  . lidocaine-prilocaine (EMLA) cream Apply  1 application topically as needed (port). 30 g 11  . metoprolol succinate (TOPROL-XL) 50 MG 24 hr tablet Take 1 tablet (50 mg total) by mouth daily. Take with or immediately following a meal. 90 tablet 3  . ondansetron (ZOFRAN) 8 MG tablet Take 1 tablet (8 mg total) by mouth every 8 (eight) hours as needed for nausea or vomiting. 30 tablet 0  . oxyCODONE (ROXICODONE) 15 MG immediate release tablet Take 1 tablet (15 mg total) by mouth  every 6 (six) hours as needed. 90 tablet 0  . sacubitril-valsartan (ENTRESTO) 24-26 MG Take 1 tablet by mouth 2 (two) times daily. 60 tablet 6   No current facility-administered medications for this visit.   Facility-Administered Medications Ordered in Other Visits  Medication Dose Route Frequency Provider Last Rate Last Admin  . sodium chloride 0.9 % injection 10 mL  10 mL Intravenous PRN Alvy Bimler, Tobi Leinweber, MD   10 mL at 11/29/16 0818  . sodium chloride 0.9 % injection 10 mL  10 mL Intravenous PRN Alvy Bimler, Toniya Rozar, MD        PHYSICAL EXAMINATION: ECOG PERFORMANCE STATUS: 1 - Symptomatic but completely ambulatory  Vitals:   02/25/20 1449  BP: 118/80  Pulse: 80  Resp: 18  Temp: 98.3 F (36.8 C)  SpO2: 99%   Filed Weights   02/25/20 1449  Weight: 223 lb (101.2 kg)    GENERAL:alert, no distress and comfortable NEURO: alert & oriented x 3 with fluent speech, no focal motor/sensory deficits  LABORATORY DATA:  I have reviewed the data as listed    Component Value Date/Time   NA 142 02/24/2020 1445   NA 141 04/20/2017 1028   K 3.8 02/24/2020 1445   K 4.3 04/20/2017 1028   CL 103 02/24/2020 1445   CO2 30 02/24/2020 1445   CO2 26 04/20/2017 1028   GLUCOSE 99 02/24/2020 1445   GLUCOSE 96 04/20/2017 1028   BUN 14 02/24/2020 1445   BUN 16.7 04/20/2017 1028   CREATININE 1.58 (H) 02/24/2020 1445   CREATININE 2.0 (H) 04/20/2017 1028   CALCIUM 9.0 02/24/2020 1445   CALCIUM 9.0 04/20/2017 1028   PROT 7.3 02/24/2020 1445   PROT 7.0 04/20/2017 1028   ALBUMIN 4.1 02/24/2020 1445   ALBUMIN 4.0 04/20/2017 1028   AST 67 (H) 02/24/2020 1445   AST 20 04/20/2017 1028   ALT 50 (H) 02/24/2020 1445   ALT 17 04/20/2017 1028   ALKPHOS 59 02/24/2020 1445   ALKPHOS 61 04/20/2017 1028   BILITOT 0.6 02/24/2020 1445   BILITOT 0.49 04/20/2017 1028   GFRNONAA 52 (L) 02/24/2020 1445   GFRAA 58 (L) 01/01/2020 1127    No results found for: SPEP, UPEP  Lab Results  Component Value Date   WBC 6.1  02/24/2020   NEUTROABS 4.9 02/24/2020   HGB 12.7 (L) 02/24/2020   HCT 36.6 (L) 02/24/2020   MCV 101.4 (H) 02/24/2020   PLT 204 02/24/2020      Chemistry      Component Value Date/Time   NA 142 02/24/2020 1445   NA 141 04/20/2017 1028   K 3.8 02/24/2020 1445   K 4.3 04/20/2017 1028   CL 103 02/24/2020 1445   CO2 30 02/24/2020 1445   CO2 26 04/20/2017 1028   BUN 14 02/24/2020 1445   BUN 16.7 04/20/2017 1028   CREATININE 1.58 (H) 02/24/2020 1445   CREATININE 2.0 (H) 04/20/2017 1028      Component Value Date/Time   CALCIUM 9.0 02/24/2020 1445  CALCIUM 9.0 04/20/2017 1028   ALKPHOS 59 02/24/2020 1445   ALKPHOS 61 04/20/2017 1028   AST 67 (H) 02/24/2020 1445   AST 20 04/20/2017 1028   ALT 50 (H) 02/24/2020 1445   ALT 17 04/20/2017 1028   BILITOT 0.6 02/24/2020 1445   BILITOT 0.49 04/20/2017 1028       RADIOGRAPHIC STUDIES: I have reviewed multiple imaging studies with the patient I have personally reviewed the radiological images as listed and agreed with the findings in the report. CT Soft Tissue Neck W Contrast  Result Date: 02/24/2020 CLINICAL DATA:  Primary cancer of the tonsillar fossa. EXAM: CT NECK WITH CONTRAST TECHNIQUE: Multidetector CT imaging of the neck was performed using the standard protocol following the bolus administration of intravenous contrast. CONTRAST:  119mL OMNIPAQUE IOHEXOL 300 MG/ML  SOLN COMPARISON:  CT of the cervical spine May 30, 2018. FINDINGS: Pharynx and larynx: No mass or swelling. Salivary glands: No inflammation, mass, or stone. Thyroid: Normal. Lymph nodes: No lymphadenopathy. Previously described level 1 a lymph node has decreased in size now measuring 4 mm (7 mm on prior). Vascular: Right subclavian Port-A-Cath. Postsurgical soft tissue thickening on the right side of the neck along the posterior triangle, carotid sheath and parapharyngeal space. The major neck arteries remain patent. The right jugular vein is collapsed in the upper  neck. Limited intracranial: Negative. Visualized orbits: Negative. Mastoids and visualized paranasal sinuses: Near opacification of the right maxillary sinus. Mucosal thickening of the left maxillary sinus and ethmoid cells. Skeleton: Mild degenerative changes of the cervical spine. Upper chest: Mild apical scarring Other: None. IMPRESSION: Postsurgical changes of the right side of the neck. No evidence of recurrent disease. No lymphadenopathy. Electronically Signed   By: Pedro Earls M.D.   On: 02/24/2020 21:04   CT CHEST ABDOMEN PELVIS W CONTRAST  Result Date: 02/25/2020 CLINICAL DATA:  Head and neck cancer surveillance, history of tonsillar cancer, elevated liver enzymes EXAM: CT CHEST, ABDOMEN, AND PELVIS WITH CONTRAST TECHNIQUE: Multidetector CT imaging of the chest, abdomen and pelvis was performed following the standard protocol during bolus administration of intravenous contrast. CONTRAST:  131mL OMNIPAQUE IOHEXOL 300 MG/ML SOLN, additional oral enteric contrast COMPARISON:  CT chest, 11/23/2018, CT abdomen pelvis, 07/23/2015 FINDINGS: CT CHEST FINDINGS Cardiovascular: No significant vascular findings. Normal heart size. No pericardial effusion. Mediastinum/Nodes: No enlarged mediastinal, hilar, or axillary lymph nodes. Thyroid gland, trachea, and esophagus demonstrate no significant findings. Lungs/Pleura: Minimal radiation fibrosis of the lung apices, unchanged. Innumerable tiny centrilobular pulmonary nodules, most concentrated in the upper lobes. No pleural effusion or pneumothorax. Musculoskeletal: No chest wall mass or suspicious bone lesions identified. Bilateral gynecomastia. CT ABDOMEN PELVIS FINDINGS Hepatobiliary: No solid liver abnormality is seen. Hepatic steatosis. No gallstones, gallbladder wall thickening, or biliary dilatation. Pancreas: Unremarkable. No pancreatic ductal dilatation or surrounding inflammatory changes. Spleen: Normal in size without significant  abnormality. Adrenals/Urinary Tract: Adrenal glands are unremarkable. Kidneys are normal, without renal calculi, solid lesion, or hydronephrosis. Bladder is unremarkable. Stomach/Bowel: Stomach is within normal limits. Appendix appears normal. No evidence of bowel wall thickening, distention, or inflammatory changes. Vascular/Lymphatic: Aortic atherosclerosis. No enlarged abdominal or pelvic lymph nodes. Reproductive: No mass or other abnormality. Other: No abdominal wall hernia or abnormality. No abdominopelvic ascites. Musculoskeletal: No acute or significant osseous findings. IMPRESSION: 1. No evidence of metastatic disease in the chest, abdomen, or pelvis. 2. Innumerable tiny centrilobular pulmonary nodules, most concentrated in the upper lobes, likely reflecting smoking-related respiratory bronchiolitis. 3. Hepatic steatosis. 4.  Aortic Atherosclerosis (ICD10-I70.0). Electronically Signed   By: Eddie Candle M.D.   On: 02/25/2020 10:58

## 2020-02-26 ENCOUNTER — Ambulatory Visit: Payer: Medicaid Other | Admitting: Hematology and Oncology

## 2020-02-26 ENCOUNTER — Other Ambulatory Visit: Payer: Medicaid Other

## 2020-03-10 ENCOUNTER — Other Ambulatory Visit: Payer: Self-pay | Admitting: Hematology and Oncology

## 2020-03-10 ENCOUNTER — Telehealth: Payer: Self-pay

## 2020-03-10 DIAGNOSIS — C099 Malignant neoplasm of tonsil, unspecified: Secondary | ICD-10-CM

## 2020-03-10 MED ORDER — OXYCODONE HCL 15 MG PO TABS: 15.0000 mg | ORAL_TABLET | Freq: Four times a day (QID) | ORAL | 0 refills | Status: DC | PRN

## 2020-03-10 MED FILL — oxyCODONE HCL 15 MG TABS: 15 | 23 days supply | Qty: 90 | Fill #0

## 2020-03-10 NOTE — Telephone Encounter (Signed)
Called and given below message. He verbalized understanding. 

## 2020-03-10 NOTE — Telephone Encounter (Signed)
He called and requested a refill on oxycodone Rx.

## 2020-03-10 NOTE — Telephone Encounter (Signed)
done

## 2020-03-26 MED FILL — LEVOTHYROXINE SODIUM 50 MCG: 50 | 90 days supply | Qty: 90 | Fill #0

## 2020-03-30 ENCOUNTER — Other Ambulatory Visit: Payer: Self-pay | Admitting: Hematology and Oncology

## 2020-03-30 ENCOUNTER — Telehealth: Payer: Self-pay

## 2020-03-30 DIAGNOSIS — C099 Malignant neoplasm of tonsil, unspecified: Secondary | ICD-10-CM

## 2020-03-30 MED ORDER — OXYCODONE HCL 15 MG PO TABS: 15.0000 mg | ORAL_TABLET | Freq: Four times a day (QID) | ORAL | 0 refills | Status: DC | PRN

## 2020-03-30 NOTE — Telephone Encounter (Signed)
He called and requested a refill on Oxycodone Rx. 

## 2020-03-30 NOTE — Telephone Encounter (Signed)
done

## 2020-03-30 NOTE — Telephone Encounter (Signed)
Called and left message Rx sent to pharmacy. Ask him to call back for questions.

## 2020-03-31 MED FILL — oxyCODONE HCL 15 MG TABS: 15 | 22 days supply | Qty: 90 | Fill #0

## 2020-04-13 ENCOUNTER — Inpatient Hospital Stay: Payer: Medicaid Other

## 2020-04-13 ENCOUNTER — Encounter: Payer: Self-pay | Admitting: Hematology and Oncology

## 2020-04-13 ENCOUNTER — Inpatient Hospital Stay: Payer: Medicaid Other | Attending: Hematology and Oncology | Admitting: Hematology and Oncology

## 2020-04-16 ENCOUNTER — Telehealth: Payer: Self-pay | Admitting: Hematology and Oncology

## 2020-04-16 ENCOUNTER — Other Ambulatory Visit: Payer: Self-pay | Admitting: Internal Medicine

## 2020-04-16 NOTE — Telephone Encounter (Signed)
Rescheduled missed appts per 12/30 incoming call from pt requesting to reschedule to next available. Pt confirmed appt date and time.

## 2020-04-20 ENCOUNTER — Other Ambulatory Visit: Payer: Self-pay | Admitting: Hematology and Oncology

## 2020-04-20 DIAGNOSIS — C099 Malignant neoplasm of tonsil, unspecified: Secondary | ICD-10-CM

## 2020-04-20 MED FILL — LIDOCAINE-PRILOCAINE CREAM: 2.5-2.5 | 30 days supply | Qty: 30 | Fill #1

## 2020-04-20 NOTE — Telephone Encounter (Signed)
Dr. Gorsuch,  For your approval or refusal. Eon Zunker M Stefano Trulson, RN  

## 2020-04-21 ENCOUNTER — Other Ambulatory Visit: Payer: Self-pay | Admitting: Hematology and Oncology

## 2020-04-21 MED FILL — oxyCODONE HCL 15 MG TABS: 15 | 22 days supply | Qty: 90 | Fill #0

## 2020-04-23 ENCOUNTER — Inpatient Hospital Stay: Payer: Medicaid Other

## 2020-04-23 ENCOUNTER — Other Ambulatory Visit: Payer: Self-pay

## 2020-04-23 ENCOUNTER — Encounter: Payer: Self-pay | Admitting: Hematology and Oncology

## 2020-04-23 ENCOUNTER — Other Ambulatory Visit: Payer: Self-pay | Admitting: Hematology and Oncology

## 2020-04-23 ENCOUNTER — Inpatient Hospital Stay: Payer: Medicaid Other | Attending: Hematology and Oncology | Admitting: Hematology and Oncology

## 2020-04-23 DIAGNOSIS — Z5900 Homelessness unspecified: Secondary | ICD-10-CM | POA: Insufficient documentation

## 2020-04-23 DIAGNOSIS — N183 Chronic kidney disease, stage 3 unspecified: Secondary | ICD-10-CM

## 2020-04-23 DIAGNOSIS — I700 Atherosclerosis of aorta: Secondary | ICD-10-CM | POA: Diagnosis not present

## 2020-04-23 DIAGNOSIS — I509 Heart failure, unspecified: Secondary | ICD-10-CM | POA: Diagnosis not present

## 2020-04-23 DIAGNOSIS — I5022 Chronic systolic (congestive) heart failure: Secondary | ICD-10-CM

## 2020-04-23 DIAGNOSIS — R112 Nausea with vomiting, unspecified: Secondary | ICD-10-CM

## 2020-04-23 DIAGNOSIS — K760 Fatty (change of) liver, not elsewhere classified: Secondary | ICD-10-CM | POA: Insufficient documentation

## 2020-04-23 DIAGNOSIS — G8929 Other chronic pain: Secondary | ICD-10-CM | POA: Insufficient documentation

## 2020-04-23 DIAGNOSIS — I2510 Atherosclerotic heart disease of native coronary artery without angina pectoris: Secondary | ICD-10-CM | POA: Insufficient documentation

## 2020-04-23 DIAGNOSIS — C099 Malignant neoplasm of tonsil, unspecified: Secondary | ICD-10-CM

## 2020-04-23 DIAGNOSIS — Z5111 Encounter for antineoplastic chemotherapy: Secondary | ICD-10-CM

## 2020-04-23 DIAGNOSIS — Z95828 Presence of other vascular implants and grafts: Secondary | ICD-10-CM

## 2020-04-23 DIAGNOSIS — E039 Hypothyroidism, unspecified: Secondary | ICD-10-CM | POA: Diagnosis not present

## 2020-04-23 DIAGNOSIS — Z635 Disruption of family by separation and divorce: Secondary | ICD-10-CM | POA: Diagnosis not present

## 2020-04-23 DIAGNOSIS — C090 Malignant neoplasm of tonsillar fossa: Secondary | ICD-10-CM

## 2020-04-23 DIAGNOSIS — Z79899 Other long term (current) drug therapy: Secondary | ICD-10-CM | POA: Diagnosis not present

## 2020-04-23 LAB — TSH: TSH: 2.542 u[IU]/mL (ref 0.320–4.118)

## 2020-04-23 LAB — CMP (CANCER CENTER ONLY)
ALT: 46 U/L — ABNORMAL HIGH (ref 0–44)
AST: 76 U/L — ABNORMAL HIGH (ref 15–41)
Albumin: 3.9 g/dL (ref 3.5–5.0)
Alkaline Phosphatase: 67 U/L (ref 38–126)
Anion gap: 11 (ref 5–15)
BUN: 9 mg/dL (ref 6–20)
CO2: 25 mmol/L (ref 22–32)
Calcium: 9.4 mg/dL (ref 8.9–10.3)
Chloride: 104 mmol/L (ref 98–111)
Creatinine: 1.48 mg/dL — ABNORMAL HIGH (ref 0.61–1.24)
GFR, Estimated: 57 mL/min — ABNORMAL LOW (ref >60)
Glucose, Bld: 113 mg/dL — ABNORMAL HIGH (ref 70–99)
Potassium: 3.9 mmol/L (ref 3.5–5.1)
Sodium: 140 mmol/L (ref 135–145)
Total Bilirubin: 0.7 mg/dL (ref 0.3–1.2)
Total Protein: 7.2 g/dL (ref 6.5–8.1)

## 2020-04-23 LAB — CBC WITH DIFFERENTIAL/PLATELET
Abs Immature Granulocytes: 0.03 10*3/uL (ref 0.00–0.07)
Basophils Absolute: 0 10*3/uL (ref 0.0–0.1)
Basophils Relative: 0 %
Eosinophils Absolute: 0 10*3/uL (ref 0.0–0.5)
Eosinophils Relative: 0 %
HCT: 37.2 % — ABNORMAL LOW (ref 39.0–52.0)
Hemoglobin: 12.9 g/dL — ABNORMAL LOW (ref 13.0–17.0)
Immature Granulocytes: 1 %
Lymphocytes Relative: 6 %
Lymphs Abs: 0.4 10*3/uL — ABNORMAL LOW (ref 0.7–4.0)
MCH: 35.7 pg — ABNORMAL HIGH (ref 26.0–34.0)
MCHC: 34.7 g/dL (ref 30.0–36.0)
MCV: 103 fL — ABNORMAL HIGH (ref 80.0–100.0)
Monocytes Absolute: 0.6 10*3/uL (ref 0.1–1.0)
Monocytes Relative: 9 %
Neutro Abs: 5.4 10*3/uL (ref 1.7–7.7)
Neutrophils Relative %: 84 %
Platelets: 203 10*3/uL (ref 150–400)
RBC: 3.61 MIL/uL — ABNORMAL LOW (ref 4.22–5.81)
RDW: 14.6 % (ref 11.5–15.5)
WBC: 6.5 10*3/uL (ref 4.0–10.5)
nRBC: 0 % (ref 0.0–0.2)

## 2020-04-23 MED ORDER — LEVOTHYROXINE SODIUM 50 MCG PO TABS
50.0000 ug | ORAL_TABLET | Freq: Every day | ORAL | 1 refills | Status: DC
Start: 2020-04-23 — End: 2022-01-07

## 2020-04-23 MED ORDER — SODIUM CHLORIDE 0.9% FLUSH
10.0000 mL | Freq: Once | INTRAVENOUS | Status: AC
  Administered 2020-04-23: 10 mL
  Filled 2020-04-23: qty 10

## 2020-04-23 MED ORDER — HEPARIN SOD (PORK) LOCK FLUSH 100 UNIT/ML IV SOLN
500.0000 [IU] | Freq: Once | INTRAVENOUS | Status: AC | PRN
  Administered 2020-04-23: 500 [IU] via INTRAVENOUS
  Filled 2020-04-23: qty 5

## 2020-04-23 NOTE — Patient Instructions (Signed)

## 2020-04-23 NOTE — Assessment & Plan Note (Signed)
He has acquired hypothyroidism secondary to treatment We will continue to monitor his TSH and adjust medication prn 

## 2020-04-23 NOTE — Assessment & Plan Note (Signed)
Clinically, he has no signs of cancer recurrence His last imaging study was unremarkable He will continue close monitoring and physical examination every 3 months Due to the possibility of being incarcerated in the near future, the patient will contact me once he is released

## 2020-04-23 NOTE — Progress Notes (Signed)
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 His last imaging study was unremarkable He will continue close monitoring and physical examination every 3 months Due to the possibility of being incarcerated in the near future, the patient will contact me once he is released   Acquired hypothyroidism He has acquired hypothyroidism secondary to treatment We will continue to monitor his TSH and adjust medication prn  CKD (chronic kidney disease), stage III He has stable chronic kidney disease since chemotherapy Observe closely  CHF (congestive heart failure) (HCC) He has no clinical signs or symptoms of congestive heart failure He will continue medical management   No orders of the defined types were placed in this encounter.   All questions were answered. The patient knows to call the clinic with any problems, questions or concerns. The total time spent in the appointment was 20 minutes encounter with patients including review of chart and various tests results, discussions about plan of care and coordination of care plan   Artis Delay, MD 04/23/2020 11:32 AM  INTERVAL HISTORY: Please see below for problem oriented charting. He returns for further follow-up He missed his appointment recently because he lost his transportation The patient is currently homeless He has a lot of stress in his life going through a bitter divorce There is a high chance that he will be incarcerated for 6 to 12 months starting at the end of the month He will know more by the end of the  month He denies swallowing difficulties His chronic pain is stable No recent signs or symptoms of congestive heart failure  SUMMARY OF ONCOLOGIC HISTORY: Oncology History Overview Note  Tonsillar cancer, HPV positive   Primary site: Pharynx - Oropharynx (Right)   Staging method: AJCC 7th Edition   Clinical: Stage IVA (T2, N2b, M0) signed by Artis Delay, MD on 08/19/2013  1:24 PM   Summary: Stage IVA (T2, N2b, M0)     Tonsillar cancer (HCC)  07/09/2013 Procedure   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 FNA of right level III lymph node was inconclusive for cancer   07/25/2013 Imaging   PET scan showed locally advanced disease with abnormal lymphadenopathy in the right axilla   08/06/2013 Procedure   CT-guided biopsy of the lymphadenopathy was negative for malignancy   08/15/2013 Surgery   Patient has placement of port and feeding tube   08/19/2013 - 09/10/2013 Chemotherapy   Patient received chemotherapy with cisplatin. The patient only received 2 doses due to uncontrolled nausea and acute renal failure.   08/19/2013 - 10/15/2013 Radiation Therapy   Received Helical IMRT Tomotherapy:  Right Tonstil and bilateral neck / 70 Gy in 35 fractions to gross disease, 63 Gy in 35 fractions to high risk nodal echelons, and 56 Gy in 35 fractions to intermediate risk nodal echelons.   08/27/2013 - 08/30/2013 Hospital Admission   The patient was admitted to the hospital for uncontrolled nausea vomiting and dehydration.   02/14/2014 Imaging   PET/CT scan showed complete response to treatment   03/19/2014 Surgery   He had excisional lymph node biopsy from the right neck.  Pathology was benign   05/13/2014 Surgery   He had removal of Port-A-Cath.   05/22/2014 Imaging   Repeat CT scan of the neck show no evidence of disease recurrence.   12/09/2014 Imaging   Ct neck without contrast showed persistent abnormalities on the right side of neck, indeterminate    12/25/2014 Imaging   PET CT scan showed disease recurrence.   02/10/2015 Procedure   He has placement of port   02/13/2015 - 07/13/2015 Chemotherapy   He received chemotherapy with carbo/Taxol   04/21/2015 Imaging   PET CT scan showed improved disease control   08/04/2015 Imaging   PET scan showed persistent disease   08/17/2015 -  Chemotherapy   He was started with [facility]   10/19/2015 Imaging   Ct neck showed mass-like intermediate density soft tissue at the right lateral neck recurrence site stable.    02/26/2016 Imaging   CT neck showed unchanged appearance of right neck recurrence compared to 10/19/2015 CT. No noncontrast evidence of new metastatic disease in the neck. Chronic sinusitis, progressed.   02/26/2016 Imaging   Diffuse but patchy and asymmetric partial airspace filling process (ground-glass opacity) in the lungs. This could be due to respiratory bronchiolitis, hypersensitivity pneumonitis or possible drug reaction. Atypical/viral pneumonia is also possible. Pulmonary consultation may be a helpful. A three-month follow-up noncontrast chest CT is suggested. Slight interval enlargement of mediastinal lymph nodes and a small lymph node along the left major fissure. This is most likely due to the inflammatory process in the lungs. No findings for metastatic disease involving the chest. No findings for upper abdominal metastatic disease.   02/29/2016 Adverse Reaction   His treatment is placed on hold due to possible hypersensitivity pneumonitis/drug reaction   04/14/2016 Imaging   Ct chest showed no evidence for metastatic disease within the chest. Significant interval improvement and near complete resolution of previously described diffuse bilateral predominately ground-glass pulmonary opacities, most compatible with resolving infectious/inflammatory process.   09/05/2016 Imaging   CT scan of neck and chest  1. Unchanged appearance of right neck recurrence. 2. No evidence of  new metastatic disease in the neck. 3. Unremarkable and stable CT appearance of the chest. No findings suspicious for metastatic disease   07/24/2017 Imaging   1. No findings suspicious for metastatic disease in the chest. 2. No acute consolidative airspace disease to suggest a pneumonia. 3. No appreciable change in chronic mild patchy upper lung predominant centrilobular micronodularity. If the patient is a current smoker, these findings are most compatible with smoking related interstitial lung disease. If the patient is not a current smoker, these findings suggest subacute hypersensitivity pneumonitis  or postinflammatory change. 4. Stable mild biapical radiation fibrosis. 5. Mild to moderate centrilobular emphysema with diffuse bronchial wall thickening, suggesting COPD. 6. One vessel coronary atherosclerosis.  Aortic Atherosclerosis (ICD10-I70.0) and Emphysema (ICD10-J43.9).   09/15/2017 Imaging   1. No evidence of interstitial lung disease. 2. Emphysema (ICD10-J43.9).   10/29/2017 Imaging   1. Moderate quality exam for pulmonary embolism with primary limitation of motion. No embolism identified. 2. Findings of congestive heart failure, including bilateral pleural effusions and septal thickening. 3. New thoracic adenopathy since approximately 6 weeks ago. Favor secondary to fluid overload/congestive heart failure. 4. New left apical 4 mm pulmonary nodule. Favored to represent a subpleural lymph node. Non-contrast chest CT can be considered in 12 months, given risk factors for primary bronchogenic carcinoma. This recommendation follows the consensus statement: Guidelines for Management of Incidental Pulmonary Nodules Detected on CT  Images: From the Fleischner Society 2017; Radiology 2017; [phone].    10/29/2017 - 11/02/2017 Hospital Admission   He was admitted to the hospital due to shortness of breath and was found to have congestive heart failure.   10/30/2017 Imaging   Definity  used; severe global reduction in LV systolic function; severe LVE; restrictive filling; mild MR; mild LAE; mild RVE with moderate RV dysfunction; mild TR with moderate pulmonary hypertension.   01/26/2018 Imaging   1. Regression of soft tissue in the postoperative right neck favoring scarring. No new or progressive finding to suggest recurrent disease. 2. Chest CT reported separately   01/26/2018 Imaging   1. No evidence of thoracic metastasis. 2. Port in the anterior chest wall with tip in distal    03/12/2018 Imaging   1. Trace, silent aspiration noted after a swallow consistent with aspiration of residual barium in the hypopharynx. 2. Retrograde reflux of contrast into the posterior nasopharynx with swallowing. 3. No gross esophageal abnormality 4. Initial difficulty swallowing a 13 mm barium tablet that than passes readily into the stomach once it is swallowed.   05/31/2018 Imaging   Resolution of previously seen mucosal hyperenhancement in the pharynx and larynx. Otherwise unchanged examination of the neck without evidence of recurrent disease or cervical nodal metastases.    06/07/2018 Echocardiogram   1. The left ventricle has moderately reduced systolic function, with an ejection fraction of 35-40%. The cavity size was severely dilated. Left ventricular diastolic Doppler parameters are consistent with impaired relaxation Left ventricular diffuse hypokinesis.  2. The right ventricle has normal systolic function. The cavity was moderately enlarged. There is no increase in right ventricular wall thickness.  3. The mitral valve is normal in structure.  4. The tricuspid valve is normal in structure.  5. The aortic valve is normal in structure.  6. The pulmonic valve was normal in structure.  7. The inferior vena cava was dilated in size with >50% respiratory variability.   02/24/2020 Imaging   Ct neck Postsurgical changes of the right side of the neck. No evidence of recurrent  disease. No lymphadenopathy   02/24/2020 Imaging   1. No evidence of metastatic disease in the chest, abdomen, or pelvis. 2. Innumerable tiny centrilobular pulmonary nodules, most concentrated in the upper lobes, likely reflecting smoking-related respiratory bronchiolitis. 3. Hepatic steatosis. 4. Aortic Atherosclerosis (ICD10-I70.0).       REVIEW OF SYSTEMS:   Constitutional: Denies fevers, chills or abnormal weight loss Eyes: Denies blurriness of vision Ears, nose, mouth, throat, and face: Denies mucositis or sore throat Respiratory: Denies cough, dyspnea or wheezes Cardiovascular: Denies palpitation, chest discomfort or lower extremity swelling Gastrointestinal:  Denies nausea, heartburn or change in bowel habits Skin: Denies abnormal skin rashes Lymphatics: Denies new lymphadenopathy or easy bruising Neurological:Denies numbness, tingling or new weaknesses Behavioral/Psych: Mood is stable, no new changes  All other systems were reviewed with the patient and are negative.  I have reviewed the past medical history, past surgical history, social history and family history with the patient and they are unchanged from previous note.  ALLERGIES:  is allergic to bee pollen and pollen extract.  MEDICATIONS:  Current Outpatient Medications  Medication Sig Dispense Refill  . albuterol (PROVENTIL HFA;VENTOLIN HFA) 108 (90 Base) MCG/ACT inhaler Inhale 2 puffs into the lungs every 6 (six) hours as needed for wheezing or shortness of breath. 1 Inhaler 6  . aspirin EC 81 MG tablet Take 81 mg by mouth daily.    Marland Kitchen ENTRESTO 24-26 MG  TAKE 1 TABLET BY MOUTH 2 (TWO) TIMES DAILY. 60 tablet 6  . fluticasone (FLONASE) 50 MCG/ACT nasal spray Place 2 sprays into both nostrils daily. 16 g 2  . isosorbide mononitrate (IMDUR) 30 MG 24 hr tablet Take 1 tablet (30 mg total) by mouth daily. 90 tablet 3  . levothyroxine (SYNTHROID) 50 MCG tablet Take 1 tablet (50 mcg total) by mouth daily before breakfast. 90  tablet 1  . lidocaine-prilocaine (EMLA) cream Apply 1 application topically as needed (port). 30 g 11  . metoprolol succinate (TOPROL-XL) 50 MG 24 hr tablet Take 1 tablet (50 mg total) by mouth daily. Take with or immediately following a meal. 90 tablet 3  . ondansetron (ZOFRAN) 8 MG tablet Take 1 tablet (8 mg total) by mouth every 8 (eight) hours as needed for nausea or vomiting. 30 tablet 0  . oxyCODONE (ROXICODONE) 15 MG immediate release tablet TAKE 1 TABLET BY MOUTH EVERY 6 HOURS AS NEEDED 90 tablet 0   No current facility-administered medications for this visit.   Facility-Administered Medications Ordered in Other Visits  Medication Dose Route Frequency Provider Last Rate Last Admin  . sodium chloride 0.9 % injection 10 mL  10 mL Intravenous PRN Alvy Bimler, Edison Wollschlager, MD   10 mL at 11/29/16 0818  . sodium chloride 0.9 % injection 10 mL  10 mL Intravenous PRN Alvy Bimler, Jame Morrell, MD        PHYSICAL EXAMINATION: ECOG PERFORMANCE STATUS: 1 - Symptomatic but completely ambulatory  Vitals:   04/23/20 1025  BP: (!) 151/96  Pulse: 89  Resp: 18  Temp: 98.7 F (37.1 C)  SpO2: 98%   Filed Weights   04/23/20 1025  Weight: 223 lb 6.4 oz (101.3 kg)    GENERAL:alert, no distress and comfortable SKIN: skin color, texture, turgor are normal, no rashes or significant lesions EYES: normal, Conjunctiva are pink and non-injected, sclera clear OROPHARYNX:no exudate, no erythema and lips, buccal mucosa, and tongue normal  NECK: Well-healed surgical scar on the right side of his neck  LYMPH:  no palpable lymphadenopathy in the cervical, axillary or inguinal LUNGS: clear to auscultation and percussion with normal breathing effort HEART: regular rate & rhythm and no murmurs and no lower extremity edema ABDOMEN:abdomen soft, non-tender and normal bowel sounds Musculoskeletal:no cyanosis of digits and no clubbing  NEURO: alert & oriented x 3 with fluent speech, no focal motor/sensory deficits  LABORATORY DATA:   I have reviewed the data as listed    Component Value Date/Time   NA 140 04/23/2020 1003   NA 141 04/20/2017 1028   K 3.9 04/23/2020 1003   K 4.3 04/20/2017 1028   CL 104 04/23/2020 1003   CO2 25 04/23/2020 1003   CO2 26 04/20/2017 1028   GLUCOSE 113 (H) 04/23/2020 1003   GLUCOSE 96 04/20/2017 1028   BUN 9 04/23/2020 1003   BUN 16.7 04/20/2017 1028   CREATININE 1.48 (H) 04/23/2020 1003   CREATININE 2.0 (H) 04/20/2017 1028   CALCIUM 9.4 04/23/2020 1003   CALCIUM 9.0 04/20/2017 1028   PROT 7.2 04/23/2020 1003   PROT 7.0 04/20/2017 1028   ALBUMIN 3.9 04/23/2020 1003   ALBUMIN 4.0 04/20/2017 1028   AST 76 (H) 04/23/2020 1003   AST 20 04/20/2017 1028   ALT 46 (H) 04/23/2020 1003   ALT 17 04/20/2017 1028   ALKPHOS 67 04/23/2020 1003   ALKPHOS 61 04/20/2017 1028   BILITOT 0.7 04/23/2020 1003   BILITOT 0.49 04/20/2017 1028   GFRNONAA 57 (L)  04/23/2020 1003   GFRAA 58 (L) 01/01/2020 1127    No results found for: SPEP, UPEP  Lab Results  Component Value Date   WBC 6.5 04/23/2020   NEUTROABS 5.4 04/23/2020   HGB 12.9 (L) 04/23/2020   HCT 37.2 (L) 04/23/2020   MCV 103.0 (H) 04/23/2020   PLT 203 04/23/2020      Chemistry      Component Value Date/Time   NA 140 04/23/2020 1003   NA 141 04/20/2017 1028   K 3.9 04/23/2020 1003   K 4.3 04/20/2017 1028   CL 104 04/23/2020 1003   CO2 25 04/23/2020 1003   CO2 26 04/20/2017 1028   BUN 9 04/23/2020 1003   BUN 16.7 04/20/2017 1028   CREATININE 1.48 (H) 04/23/2020 1003   CREATININE 2.0 (H) 04/20/2017 1028      Component Value Date/Time   CALCIUM 9.4 04/23/2020 1003   CALCIUM 9.0 04/20/2017 1028   ALKPHOS 67 04/23/2020 1003   ALKPHOS 61 04/20/2017 1028   AST 76 (H) 04/23/2020 1003   AST 20 04/20/2017 1028   ALT 46 (H) 04/23/2020 1003   ALT 17 04/20/2017 1028   BILITOT 0.7 04/23/2020 1003   BILITOT 0.49 04/20/2017 1028

## 2020-04-23 NOTE — Assessment & Plan Note (Signed)
He has stable chronic kidney disease since chemotherapy Observe closely 

## 2020-04-23 NOTE — Assessment & Plan Note (Signed)
He has no clinical signs or symptoms of congestive heart failure He will continue medical management 

## 2020-05-01 ENCOUNTER — Other Ambulatory Visit: Payer: Self-pay | Admitting: Internal Medicine

## 2020-05-01 ENCOUNTER — Telehealth: Payer: Self-pay | Admitting: Internal Medicine

## 2020-05-01 MED FILL — METOPROLOL SUCCINATE ER 50: 50 | 90 days supply | Qty: 90 | Fill #0

## 2020-05-01 NOTE — Telephone Encounter (Signed)
Pt c/o medication issue:  1. Name of Medication: ENTRESTO 24-26 MG  2. How are you currently taking this medication (dosage and times per day)? New medication, hasn't started  3. Are you having a reaction (difficulty breathing--STAT)? no  4. What is your medication issue? Monica from [place] states the medication needs a prior authorization.

## 2020-05-01 NOTE — Telephone Encounter (Signed)
I started a Entresto PA through [employer]. Key: [id number]

## 2020-05-04 MED FILL — ENTRESTO 24 MG-26 MG TABLET: 24-26 | 30 days supply | Qty: 60 | Fill #0

## 2020-05-12 ENCOUNTER — Telehealth: Payer: Self-pay

## 2020-05-12 ENCOUNTER — Other Ambulatory Visit: Payer: Self-pay | Admitting: Hematology and Oncology

## 2020-05-12 DIAGNOSIS — C099 Malignant neoplasm of tonsil, unspecified: Secondary | ICD-10-CM

## 2020-05-12 MED FILL — oxyCODONE HCL 15 MG TABS: 15 | 22 days supply | Qty: 90 | Fill #0

## 2020-05-12 NOTE — Telephone Encounter (Signed)
Called and scheduled appts with Dr. Alvy Bimler on 2/7. He is aware of appts. Pain medication RX sent to pharmacy.

## 2020-05-25 ENCOUNTER — Inpatient Hospital Stay: Payer: Medicaid Other

## 2020-05-25 ENCOUNTER — Inpatient Hospital Stay: Payer: Medicaid Other | Attending: Hematology and Oncology | Admitting: Hematology and Oncology

## 2020-05-25 ENCOUNTER — Encounter: Payer: Self-pay | Admitting: Hematology and Oncology

## 2020-05-25 ENCOUNTER — Other Ambulatory Visit: Payer: Self-pay | Admitting: *Deleted

## 2020-05-25 ENCOUNTER — Other Ambulatory Visit: Payer: Self-pay

## 2020-05-25 DIAGNOSIS — Z79899 Other long term (current) drug therapy: Secondary | ICD-10-CM | POA: Insufficient documentation

## 2020-05-25 DIAGNOSIS — I509 Heart failure, unspecified: Secondary | ICD-10-CM | POA: Diagnosis not present

## 2020-05-25 DIAGNOSIS — I5022 Chronic systolic (congestive) heart failure: Secondary | ICD-10-CM

## 2020-05-25 DIAGNOSIS — I2510 Atherosclerotic heart disease of native coronary artery without angina pectoris: Secondary | ICD-10-CM | POA: Insufficient documentation

## 2020-05-25 DIAGNOSIS — R112 Nausea with vomiting, unspecified: Secondary | ICD-10-CM

## 2020-05-25 DIAGNOSIS — N183 Chronic kidney disease, stage 3 unspecified: Secondary | ICD-10-CM | POA: Insufficient documentation

## 2020-05-25 DIAGNOSIS — K760 Fatty (change of) liver, not elsewhere classified: Secondary | ICD-10-CM | POA: Diagnosis not present

## 2020-05-25 DIAGNOSIS — C099 Malignant neoplasm of tonsil, unspecified: Secondary | ICD-10-CM

## 2020-05-25 DIAGNOSIS — E039 Hypothyroidism, unspecified: Secondary | ICD-10-CM | POA: Diagnosis not present

## 2020-05-25 DIAGNOSIS — C090 Malignant neoplasm of tonsillar fossa: Secondary | ICD-10-CM | POA: Diagnosis present

## 2020-05-25 DIAGNOSIS — G893 Neoplasm related pain (acute) (chronic): Secondary | ICD-10-CM | POA: Diagnosis not present

## 2020-05-25 DIAGNOSIS — Z95828 Presence of other vascular implants and grafts: Secondary | ICD-10-CM

## 2020-05-25 DIAGNOSIS — I700 Atherosclerosis of aorta: Secondary | ICD-10-CM | POA: Diagnosis not present

## 2020-05-25 LAB — CBC WITH DIFFERENTIAL (CANCER CENTER ONLY)
Abs Immature Granulocytes: 0.03 10*3/uL (ref 0.00–0.07)
Basophils Absolute: 0 10*3/uL (ref 0.0–0.1)
Basophils Relative: 1 %
Eosinophils Absolute: 0.1 10*3/uL (ref 0.0–0.5)
Eosinophils Relative: 1 %
HCT: 39.3 % (ref 39.0–52.0)
Hemoglobin: 13.4 g/dL (ref 13.0–17.0)
Immature Granulocytes: 1 %
Lymphocytes Relative: 10 %
Lymphs Abs: 0.6 10*3/uL — ABNORMAL LOW (ref 0.7–4.0)
MCH: 35.6 pg — ABNORMAL HIGH (ref 26.0–34.0)
MCHC: 34.1 g/dL (ref 30.0–36.0)
MCV: 104.5 fL — ABNORMAL HIGH (ref 80.0–100.0)
Monocytes Absolute: 0.5 10*3/uL (ref 0.1–1.0)
Monocytes Relative: 8 %
Neutro Abs: 4.9 10*3/uL (ref 1.7–7.7)
Neutrophils Relative %: 79 %
Platelet Count: 181 10*3/uL (ref 150–400)
RBC: 3.76 MIL/uL — ABNORMAL LOW (ref 4.22–5.81)
RDW: 13.6 % (ref 11.5–15.5)
WBC Count: 6.2 10*3/uL (ref 4.0–10.5)
nRBC: 0 % (ref 0.0–0.2)

## 2020-05-25 LAB — CMP (CANCER CENTER ONLY)
ALT: 32 U/L (ref 0–44)
AST: 62 U/L — ABNORMAL HIGH (ref 15–41)
Albumin: 3.8 g/dL (ref 3.5–5.0)
Alkaline Phosphatase: 71 U/L (ref 38–126)
Anion gap: 10 (ref 5–15)
BUN: 8 mg/dL (ref 6–20)
CO2: 24 mmol/L (ref 22–32)
Calcium: 8.7 mg/dL — ABNORMAL LOW (ref 8.9–10.3)
Chloride: 105 mmol/L (ref 98–111)
Creatinine: 1.46 mg/dL — ABNORMAL HIGH (ref 0.61–1.24)
GFR, Estimated: 58 mL/min — ABNORMAL LOW (ref >60)
Glucose, Bld: 82 mg/dL (ref 70–99)
Potassium: 3.5 mmol/L (ref 3.5–5.1)
Sodium: 139 mmol/L (ref 135–145)
Total Bilirubin: 0.6 mg/dL (ref 0.3–1.2)
Total Protein: 7.1 g/dL (ref 6.5–8.1)

## 2020-05-25 LAB — TSH: TSH: 3.825 u[IU]/mL (ref 0.320–4.118)

## 2020-05-25 MED ORDER — HEPARIN SOD (PORK) LOCK FLUSH 100 UNIT/ML IV SOLN
500.0000 [IU] | Freq: Once | INTRAVENOUS | Status: AC | PRN
  Administered 2020-05-25: 500 [IU]
  Filled 2020-05-25: qty 5

## 2020-05-25 MED ORDER — SODIUM CHLORIDE 0.9% FLUSH
10.0000 mL | Freq: Once | INTRAVENOUS | Status: AC | PRN
  Administered 2020-05-25: 10 mL
  Filled 2020-05-25: qty 10

## 2020-05-25 NOTE — Assessment & Plan Note (Signed)
He has stable chronic kidney disease since chemotherapy Observe closely 

## 2020-05-25 NOTE — Assessment & Plan Note (Signed)
His pain medicine was recently refilled The patient is made aware that once he is incarcerated, the dedicated physician who oversee and supervise incarcerated inmates will take over pain management

## 2020-05-25 NOTE — Assessment & Plan Note (Signed)
Clinically, he has no signs of cancer recurrence His last imaging study was unremarkable He will continue close monitoring and physical examination every 3 months Due to the possibility of being incarcerated in the near future, the patient will contact me once he has attended his sentencing hearing in 2 weeks

## 2020-05-25 NOTE — Progress Notes (Signed)
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 His last imaging study was unremarkable He will continue close monitoring and physical examination every 3 months Due to the possibility of being incarcerated in the near future, the patient will contact me once he has attended his sentencing hearing in 2 weeks   CKD (chronic kidney disease), stage III He has stable chronic kidney disease since chemotherapy Observe closely  Acquired hypothyroidism He has acquired hypothyroidism secondary to treatment We will continue to monitor his TSH and adjust medication prn  CHF (congestive heart failure) ([place]) He has no clinical signs or symptoms of congestive heart failure He will continue medical management  Cancer associated pain His pain medicine was recently refilled The patient is made aware that once he is incarcerated, the dedicated physician who oversee and supervise incarcerated inmates will take over pain management   No orders of the defined types were placed in this encounter.   All questions were answered. The patient knows to call the clinic with any problems, questions or concerns. The total time spent in the appointment was 20 minutes encounter with patients including review of chart and various tests results, discussions about plan of care and coordination of care plan   [name], MD 05/25/2020 2:18 PM  INTERVAL HISTORY: Please see below for problem oriented charting. The patient is seen again today to discuss his future follow-up The patient will be  attending sentencing hearing on February 21 He might be incarcerated somewhere between 8 months to a year He is concerned about his medications and would like all his medications refilled prior to his incarceration He denies new signs or symptoms to suggest disease progression He is worried about his port not being flushed while he is incarcerated  SUMMARY OF ONCOLOGIC HISTORY: Oncology History Overview Note  Tonsillar cancer, HPV positive   Primary site: Pharynx - Oropharynx (Right)   Staging method: AJCC 7th Edition   Clinical: Stage IVA (T2, N2b, M0) signed by [name], MD on 08/19/2013  1:24 PM   Summary: Stage IVA (T2, N2b, M0)     Tonsillar cancer ([place])  07/09/2013 Procedure   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 FNA of right level III lymph node was inconclusive for cancer   07/25/2013 Imaging   PET scan showed locally advanced disease with abnormal lymphadenopathy in the right axilla   08/06/2013 Procedure   CT-guided biopsy of the lymphadenopathy was negative for malignancy   08/15/2013 Surgery   Patient has placement of port and feeding tube   08/19/2013 - 09/10/2013 Chemotherapy   Patient received chemotherapy with cisplatin. The patient only received 2 doses due to uncontrolled nausea and acute renal failure.   08/19/2013 - 10/15/2013 Radiation Therapy   Received Helical IMRT Tomotherapy:  Right Tonstil and bilateral neck / 70 Gy in 35 fractions to gross disease, 63 Gy in 35 fractions to high risk nodal echelons, and 56 Gy in 35 fractions to intermediate risk nodal echelons.   08/27/2013 - 08/30/2013 Hospital Admission   The patient was admitted to the hospital for uncontrolled nausea vomiting and dehydration.  02/14/2014 Imaging   PET/CT scan showed complete response to treatment   03/19/2014 Surgery   He had excisional lymph node biopsy from the right neck. Pathology was benign   05/13/2014 Surgery   He had removal of  Port-A-Cath.   05/22/2014 Imaging   Repeat CT scan of the neck show no evidence of disease recurrence.   12/09/2014 Imaging   Ct neck without contrast showed persistent abnormalities on the right side of neck, indeterminate   12/25/2014 Imaging   PET CT scan showed disease recurrence.   02/10/2015 Procedure   He has placement of port   02/13/2015 - 07/13/2015 Chemotherapy   He received chemotherapy with carbo/Taxol   04/21/2015 Imaging   PET CT scan showed improved disease control   08/04/2015 Imaging   PET scan showed persistent disease   08/17/2015 -  Chemotherapy   He was started with [facility]   10/19/2015 Imaging   Ct neck showed mass-like intermediate density soft tissue at the right lateral neck recurrence site stable.    02/26/2016 Imaging   CT neck showed unchanged appearance of right neck recurrence compared to 10/19/2015 CT. No noncontrast evidence of new metastatic disease in the neck. Chronic sinusitis, progressed.   02/26/2016 Imaging   Diffuse but patchy and asymmetric partial airspace filling process (ground-glass opacity) in the lungs. This could be due to respiratory bronchiolitis, hypersensitivity pneumonitis or possible drug reaction. Atypical/viral pneumonia is also possible. Pulmonary consultation may be a helpful. A three-month follow-up noncontrast chest CT is suggested. Slight interval enlargement of mediastinal lymph nodes and a small lymph node along the left major fissure. This is most likely due to the inflammatory process in the lungs. No findings for metastatic disease involving the chest. No findings for upper abdominal metastatic disease.   02/29/2016 Adverse Reaction   His treatment is placed on hold due to possible hypersensitivity pneumonitis/drug reaction   04/14/2016 Imaging   Ct chest showed no evidence for metastatic disease within the chest. Significant interval improvement and near complete resolution of previously described diffuse bilateral  predominately ground-glass pulmonary opacities, most compatible with resolving infectious/inflammatory process.   09/05/2016 Imaging   CT scan of neck and chest  1. Unchanged appearance of right neck recurrence. 2. No evidence of new metastatic disease in the neck. 3. Unremarkable and stable CT appearance of the chest. No findings suspicious for metastatic disease   07/24/2017 Imaging   1. No findings suspicious for metastatic disease in the chest. 2. No acute consolidative airspace disease to suggest a pneumonia. 3. No appreciable change in chronic mild patchy upper lung predominant centrilobular micronodularity. If the patient is a current smoker, these findings are most compatible with smoking related interstitial lung disease. If the patient is not a current smoker, these findings suggest subacute hypersensitivity pneumonitis  or postinflammatory change. 4. Stable mild biapical radiation fibrosis. 5. Mild to moderate centrilobular emphysema with diffuse bronchial wall thickening, suggesting COPD. 6. One vessel coronary atherosclerosis.  Aortic Atherosclerosis (ICD10-I70.0) and Emphysema (ICD10-J43.9).   09/15/2017 Imaging   1. No evidence of interstitial lung disease. 2. Emphysema (ICD10-J43.9).   10/29/2017 Imaging   1. Moderate quality exam for pulmonary embolism with primary limitation of motion. No embolism identified. 2. Findings of congestive heart failure, including bilateral pleural effusions and septal thickening. 3. New thoracic adenopathy since approximately 6 weeks ago. Favor secondary to fluid overload/congestive heart failure. 4. New left apical 4 mm pulmonary nodule. Favored to represent a subpleural lymph node. Non-contrast chest CT can be  considered in 12 months, given risk factors for primary bronchogenic carcinoma. This recommendation follows the consensus statement: Guidelines for Management of Incidental Pulmonary Nodules Detected on CT Images: From the Fleischner  Society 2017; Radiology 2017; [id number].    10/29/2017 - 11/02/2017 Hospital Admission   He was admitted to the hospital due to shortness of breath and was found to have congestive heart failure.   10/30/2017 Imaging   Definity used; severe global reduction in LV systolic function; severe LVE; restrictive filling; mild MR; mild LAE; mild RVE with moderate RV dysfunction; mild TR with moderate pulmonary hypertension.   01/26/2018 Imaging   1. Regression of soft tissue in the postoperative right neck favoring scarring. No new or progressive finding to suggest recurrent disease. 2. Chest CT reported separately   01/26/2018 Imaging   1. No evidence of thoracic metastasis. 2. Port in the anterior chest wall with tip in distal    03/12/2018 Imaging   1. Trace, silent aspiration noted after a swallow consistent with aspiration of residual barium in the hypopharynx. 2. Retrograde reflux of contrast into the posterior nasopharynx with swallowing. 3. No gross esophageal abnormality 4. Initial difficulty swallowing a 13 mm barium tablet that than passes readily into the stomach once it is swallowed.   05/31/2018 Imaging   Resolution of previously seen mucosal hyperenhancement in the pharynx and larynx. Otherwise unchanged examination of the neck without evidence of recurrent disease or cervical nodal metastases.    06/07/2018 Echocardiogram   1. The left ventricle has moderately reduced systolic function, with an ejection fraction of 35-40%. The cavity size was severely dilated. Left ventricular diastolic Doppler parameters are consistent with impaired relaxation Left ventricular diffuse hypokinesis.  2. The right ventricle has normal systolic function. The cavity was moderately enlarged. There is no increase in right ventricular wall thickness.  3. The mitral valve is normal in structure.  4. The tricuspid valve is normal in structure.  5. The aortic valve is normal in structure.  6. The  pulmonic valve was normal in structure.  7. The inferior vena cava was dilated in size with >50% respiratory variability.   02/24/2020 Imaging   Ct neck Postsurgical changes of the right side of the neck. No evidence of recurrent disease. No lymphadenopathy   02/24/2020 Imaging   1. No evidence of metastatic disease in the chest, abdomen, or pelvis. 2. Innumerable tiny centrilobular pulmonary nodules, most concentrated in the upper lobes, likely reflecting smoking-related respiratory bronchiolitis. 3. Hepatic steatosis. 4. Aortic Atherosclerosis (ICD10-I70.0).       REVIEW OF SYSTEMS:   Constitutional: Denies fevers, chills or abnormal weight loss Eyes: Denies blurriness of vision Ears, nose, mouth, throat, and face: Denies mucositis or sore throat Respiratory: Denies cough, dyspnea or wheezes Cardiovascular: Denies palpitation, chest discomfort or lower extremity swelling Gastrointestinal:  Denies nausea, heartburn or change in bowel habits Skin: Denies abnormal skin rashes Lymphatics: Denies new lymphadenopathy or easy bruising Neurological:Denies numbness, tingling or new weaknesses Behavioral/Psych: Mood is stable, no new changes  All other systems were reviewed with the patient and are negative.  I have reviewed the past medical history, past surgical history, social history and family history with the patient and they are unchanged from previous note.  ALLERGIES:  is allergic to bee pollen and pollen extract.  MEDICATIONS:  Current Outpatient Medications  Medication Sig Dispense Refill  . albuterol (PROVENTIL HFA;VENTOLIN HFA) 108 (90 Base) MCG/ACT inhaler Inhale 2 puffs into the lungs every 6 (six) hours as needed for wheezing  or shortness of breath. 1 Inhaler 6  . aspirin EC 81 MG tablet Take 81 mg by mouth daily.    Marland Kitchen ENTRESTO 24-26 MG TAKE 1 TABLET BY MOUTH 2 (TWO) TIMES DAILY. 60 tablet 6  . fluticasone (FLONASE) 50 MCG/ACT nasal spray Place 2 sprays into both nostrils  daily. 16 g 2  . isosorbide mononitrate (IMDUR) 30 MG 24 hr tablet Take 1 tablet (30 mg total) by mouth daily. 90 tablet 3  . levothyroxine (SYNTHROID) 50 MCG tablet Take 1 tablet (50 mcg total) by mouth daily before breakfast. 90 tablet 1  . lidocaine-prilocaine (EMLA) cream Apply 1 application topically as needed (port). 30 g 11  . metoprolol succinate (TOPROL-XL) 50 MG 24 hr tablet TAKE 1 TABLET (50 MG TOTAL) BY MOUTH DAILY. TAKE WITH OR IMMEDIATELY FOLLOWING A MEAL. (STOP CARVEDILOL) 90 tablet 3  . ondansetron (ZOFRAN) 8 MG tablet Take 1 tablet (8 mg total) by mouth every 8 (eight) hours as needed for nausea or vomiting. 30 tablet 0  . oxyCODONE (ROXICODONE) 15 MG immediate release tablet TAKE 1 TABLET BY MOUTH EVERY 6 HOURS AS NEEDED 90 tablet 0   No current facility-administered medications for this visit.   Facility-Administered Medications Ordered in Other Visits  Medication Dose Route Frequency Provider Last Rate Last Admin  . sodium chloride 0.9 % injection 10 mL  10 mL Intravenous PRN Alvy Bimler, Shayle Donahoo, MD   10 mL at 11/29/16 0818  . sodium chloride 0.9 % injection 10 mL  10 mL Intravenous PRN Alvy Bimler, Zian Mohamed, MD        PHYSICAL EXAMINATION: ECOG PERFORMANCE STATUS: 0 - Asymptomatic  Vitals:   05/25/20 1131  BP: (!) 152/81  Pulse: 84  Resp: 18  Temp: (!) 97.4 F (36.3 C)  SpO2: 98%   Filed Weights   05/25/20 1131  Weight: 224 lb (101.6 kg)    GENERAL:alert, no distress and comfortable NEURO: alert & oriented x 3 with fluent speech, no focal motor/sensory deficits  LABORATORY DATA:  I have reviewed the data as listed    Component Value Date/Time   NA 139 05/25/2020 1120   NA 141 04/20/2017 1028   K 3.5 05/25/2020 1120   K 4.3 04/20/2017 1028   CL 105 05/25/2020 1120   CO2 24 05/25/2020 1120   CO2 26 04/20/2017 1028   GLUCOSE 82 05/25/2020 1120   GLUCOSE 96 04/20/2017 1028   BUN 8 05/25/2020 1120   BUN 16.7 04/20/2017 1028   CREATININE 1.46 (H) 05/25/2020 1120    CREATININE 2.0 (H) 04/20/2017 1028   CALCIUM 8.7 (L) 05/25/2020 1120   CALCIUM 9.0 04/20/2017 1028   PROT 7.1 05/25/2020 1120   PROT 7.0 04/20/2017 1028   ALBUMIN 3.8 05/25/2020 1120   ALBUMIN 4.0 04/20/2017 1028   AST 62 (H) 05/25/2020 1120   AST 20 04/20/2017 1028   ALT 32 05/25/2020 1120   ALT 17 04/20/2017 1028   ALKPHOS 71 05/25/2020 1120   ALKPHOS 61 04/20/2017 1028   BILITOT 0.6 05/25/2020 1120   BILITOT 0.49 04/20/2017 1028   GFRNONAA 58 (L) 05/25/2020 1120   GFRAA 58 (L) 01/01/2020 1127    No results found for: SPEP, UPEP  Lab Results  Component Value Date   WBC 6.2 05/25/2020   NEUTROABS 4.9 05/25/2020   HGB 13.4 05/25/2020   HCT 39.3 05/25/2020   MCV 104.5 (H) 05/25/2020   PLT 181 05/25/2020      Chemistry      Component Value Date/Time  NA 139 05/25/2020 1120   NA 141 04/20/2017 1028   K 3.5 05/25/2020 1120   K 4.3 04/20/2017 1028   CL 105 05/25/2020 1120   CO2 24 05/25/2020 1120   CO2 26 04/20/2017 1028   BUN 8 05/25/2020 1120   BUN 16.7 04/20/2017 1028   CREATININE 1.46 (H) 05/25/2020 1120   CREATININE 2.0 (H) 04/20/2017 1028      Component Value Date/Time   CALCIUM 8.7 (L) 05/25/2020 1120   CALCIUM 9.0 04/20/2017 1028   ALKPHOS 71 05/25/2020 1120   ALKPHOS 61 04/20/2017 1028   AST 62 (H) 05/25/2020 1120   AST 20 04/20/2017 1028   ALT 32 05/25/2020 1120   ALT 17 04/20/2017 1028   BILITOT 0.6 05/25/2020 1120   BILITOT 0.49 04/20/2017 1028

## 2020-05-25 NOTE — Assessment & Plan Note (Signed)
He has no clinical signs or symptoms of congestive heart failure He will continue medical management 

## 2020-05-25 NOTE — Assessment & Plan Note (Signed)
He has acquired hypothyroidism secondary to treatment We will continue to monitor his TSH and adjust medication prn 

## 2020-06-04 ENCOUNTER — Other Ambulatory Visit: Payer: Self-pay | Admitting: Internal Medicine

## 2020-06-04 MED FILL — ENTRESTO 24 MG-26 MG TABLET: 24-26 | 30 days supply | Qty: 60 | Fill #1

## 2020-06-05 ENCOUNTER — Other Ambulatory Visit (HOSPITAL_COMMUNITY): Payer: Self-pay | Admitting: Family

## 2020-06-05 MED FILL — HYDRALAZINE HCL 25 MG TABS: 25 | 90 days supply | Qty: 270 | Fill #0

## 2021-03-05 ENCOUNTER — Other Ambulatory Visit: Payer: Self-pay | Admitting: Nurse Practitioner

## 2021-12-27 ENCOUNTER — Encounter: Payer: Self-pay | Admitting: Internal Medicine

## 2021-12-27 ENCOUNTER — Telehealth: Payer: Self-pay | Admitting: Internal Medicine

## 2021-12-27 ENCOUNTER — Ambulatory Visit: Payer: Medicaid Other | Attending: Internal Medicine | Admitting: Internal Medicine

## 2021-12-27 ENCOUNTER — Telehealth: Payer: Self-pay

## 2021-12-27 VITALS — BP 150/102 | HR 83 | Ht 78.0 in | Wt 280.0 lb

## 2021-12-27 DIAGNOSIS — I10 Essential (primary) hypertension: Secondary | ICD-10-CM | POA: Insufficient documentation

## 2021-12-27 NOTE — Telephone Encounter (Signed)
Patient states for the past few weeks he has been experiencing fatigue. He scheduled overdue follow-up for today, 9/11 at 2:20 PM with Dr. Harrington Challenger to discuss further/refill medications.

## 2021-12-27 NOTE — Telephone Encounter (Signed)
Returned his call. He is home now. Updated phone #. He is requesting appt to see Dr. Alvy Bimler and also needs medication refills. He is asking about his port being removed. [facility] port flush [facility] state about 3 weeks ago.  Told him I would forward the message to Dr. Alvy Bimler and the scheduler would call him with appt.

## 2021-12-27 NOTE — Progress Notes (Unsigned)
Cardiology Office Note   Date:  12/29/2021   ID:  Mason [name], DOB 1965/10/27, MRN [id number]  PCP:  [name], FNP  Cardiologist:   [name], MD   F/U of  Chronic systolic CHF     History of Present Illness: Mason [name] is a 56 y.o. 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  RVEF was also reduced.   Pt denied hx of CP  He was discharged from the hospital after diuresis on carvedilol, hydralazine and NTG     Patient also has a hx of HTN and tonsillar CA   The patient did well initially but then retained fluid due to salt indiscretion.   He was seen in clinic by [name] in March 2020 for CP   L sided, waxing/waining   Worse with sex, driving, changes in position.   Echo in Feb 2020  LVEF 35to 40%   RV large but RVEF normal   The pt was seen in televist on 08/09/18  Doing well   Off of Hydralazine and NTG at that time    Echo ordered    Done in JUne  LVEF 25 to 30%   REcomm restarting hydralaizne and imdur  Plan for repeat limited echo in fall   Seen by EP Caryl Comes)   Functional capacity felt to be good       I saw the pt in 2020   He has also been seen at [facility] (Cardiology )   Echo just done in Aug 2023  LVEF 35 to 40%     I last saw the pt in cardiology clinic in Dec 2020 The pt says he feels great from a cardiac standpoint.   Today he has some abdominal pain   He attrib to something he ate yesterday     He says his breathing is good  He denies SOB  No dizziness   No palpitatoins   He says when he checks  his BP at home it  is usually 110s/      Current Meds  Medication Sig   aspirin EC 81 MG tablet Take 81 mg by mouth daily.   fluticasone (FLONASE) 50 MCG/ACT nasal spray Place 2 sprays into both nostrils daily.   hydrALAZINE (APRESOLINE) 25 MG tablet TAKE 1 TABLET BY MOUTH 3 TIMES DAILY   isosorbide mononitrate (IMDUR) 30 MG 24 hr tablet Take 1 tablet (30 mg total) by mouth daily.   levothyroxine (SYNTHROID) 50 MCG  tablet Take 1 tablet (50 mcg total) by mouth daily before breakfast.   metoprolol succinate (TOPROL-XL) 50 MG 24 hr tablet TAKE 1 TABLET BY MOUTH ONCE A DAY WITH OR IMMEDIATELY FOLLOWING A MEAL     Allergies:   Bee pollen and Pollen extract   Past Medical History:  Diagnosis Date   Abnormal liver function test    Acute sinusitis, unspecified 05/18/2015   Anemia    Anxiety    mild new dx   Arthritis    knees,hips   Bilateral edema of lower extremity    Complication of anesthesia    Pt stated " my oxygen level was slow in rising."   Concussion    Hx: in high school x 2   Constipation    Dysuria 09/04/2015   Fever    Hyperactive gag reflex    Hypertension    Hypoglycemia    Hypokalemia    Insomnia  06/15/2015   Knee pain, chronic    Malnutrition ([place])    Non-healing surgical wound 05/23/2014   PEG (percutaneous endoscopic gastrostomy) status ([place])    Renal failure, acute ([place])    S/P radiation therapy 08/19/2013-10/15/2013   Right Tonstil and bilateral neck / 70 Gy in 35 fractions to gross disease, 63 Gy in 35 fractions to high risk nodal echelons, and 56 Gy in 35 fractions to intermediate risk nodal echelons   Severe nausea and vomiting    Status post chemotherapy    Only received 2 doses due to uncontrolled nausea and acute renal failure   Tonsillar cancer ([place]) 07/09/13   SCCA of Right Tonsil, recurrent 2016    Past Surgical History:  Procedure Laterality Date   LAPAROSCOPIC GASTROSTOMY N/A 08/15/2013   Procedure: LAPAROSCOPIC GASTROSTOMY TUBE PLACEMENT;  Surgeon: [name], MD;  Location: [place];  Service: General;  Laterality: N/A;   LYMPH NODE BIOPSY  03/20/14   right neck   MULTIPLE EXTRACTIONS WITH ALVEOLOPLASTY N/A 08/01/2013   Procedure: Extraction of tooth #'s 1,15,17,31, 32 with alveoloplasty, mandibular left torus reduction, and gross debridement of remaining teeth.;  Surgeon: [name], DDS;  Location: [place];  Service: Oral Surgery;  Laterality: N/A;    PORT-A-CATH REMOVAL N/A 05/13/2014   Procedure: REMOVAL of PORT-A-CATH;  Surgeon: [name], MD;  Location: [place];  Service: General;  Laterality: N/A;   PORTACATH PLACEMENT Left 08/15/2013   Procedure: INSERTION PORT-A-CATH;  Surgeon: [name], MD;  Location: [place];  Service: General;  Laterality: Left;     Social History:  The patient  reports that he quit smoking about 18 years ago. His smoking use included cigarettes. He has a 20.00 pack-year smoking history. He has never used smokeless tobacco. He reports current drug use. Drugs: Marijuana and Benzodiazepines. He reports that he does not drink alcohol.   Family History:  The patient's family history includes Arthritis in his father and mother; CVA in his maternal grandmother and another family member; Diabetes in an other family member; Heart attack in an other family member; Heart disease in his maternal grandmother and mother.    ROS:  Please see the history of present illness. All other systems are reviewed and  Negative to the above problem except as noted.    PHYSICAL EXAM: VS:  BP (!) 150/102   Pulse 83   Ht '6\' 6"'$  (1.981 m)   Wt 280 lb (127 kg)   SpO2 97%   BMI 32.36 kg/m   GEN: Obese 56 yo  in no acute distress  HEENT: normal  Neck: no JVD, no carotid bruits Cardiac: RRR; no murmurs,  No LE  edema  Respiratory:  clear to auscultation bilaterally,  GI: soft, nontender, nondistended, + BS  No hepatomegaly  MS: no deformity Moving all extremities   Skin: warm and dry, no rash Neuro:  Strength and sensation are intact Psych: euthymic mood, full affect   EKG:  EKG is  ordered today.  NSR 83 bpm   T wave inversion II. AVF    Echo 11/23/21  [facility])   Complete two-dimensional, color flow and Doppler transthoracic echocardiogram  is performed with contrast to opacify the left ventricle and to improve the  delineation of the left ventricle endocardial borders.   Staff  Referring Physician:     Delene Ruffini   ;  Sonographer:     Ascencion Dike  Ordering Physician:     Delene Ruffini   Account #:  [id number]   Ultrasound Enhancing Agent/Agitated Saline   ------------------------------  UEA/Ag. Saline:     Optison  Amount:     1.00 ml    Summary    1. Technically difficult study.    2. The left ventricle is upper normal in size with normal wall thickness.    3. The left ventricular systolic function is moderately to severely  decreased, LVEF is visually estimated at 35-40%.    4. There is grade I diastolic dysfunction (impaired relaxation).    5. The right ventricle is normal in size, with normal systolic function.    Left Ventricle    The left ventricle is upper normal in size with normal wall thickness.    The left ventricular systolic function is moderately to severely decreased,  LVEF is visually estimated at 35-40%.    There is grade I diastolic dysfunction (impaired relaxation).   Right Ventricle    The right ventricle is normal in size, with normal systolic function.    Left Atrium    The left atrium is not well visualized but probably normal in size.   Right Atrium    The right atrium is not well visualized but probably normal  in size.    Aortic Valve    The aortic valve is trileaflet with normal appearing leaflets with normal  excursion.    There is no significant aortic regurgitation.    There is no evidence of a significant transvalvular gradient.   Pulmonic Valve    The pulmonic valve is normal.    There is no significant pulmonic regurgitation.    There is no evidence of a significant transvalvular gradient.   Mitral Valve    The mitral valve leaflets are normal with normal leaflet mobility.    There is no significant mitral valve regurgitation.   Tricuspid Valve    The tricuspid valve leaflets are normal, with normal leaflet mobility.    There is no significant tricuspid regurgitation.    There is no pulmonary hypertension.     Pericardium/Pleural    There is no pericardial effusion.   Inferior Vena Cava    IVC size and inspiratory change suggest normal right atrial pressure. (0-5  mmHg).   Aorta    The aorta is normal in size in the visualized segments.        Lipid Panel No results found for: "CHOL", "TRIG", "HDL", "CHOLHDL", "VLDL", "LDLCALC", "LDLDIRECT"    Wt Readings from Last 3 Encounters:  12/27/21 280 lb (127 kg)  05/25/20 224 lb (101.6 kg)  04/23/20 223 lb 6.4 oz (101.3 kg)      ASSESSMENT AND PLAN:  1  Chronic systolic CHFLast echo was done at [facility]   LVEF 35 to 40%   Currently on hydralazine, imdur and Toprol XL     Volume status looks good  Pt says he had labs at [facility]  Will try to retrieve  2  HTN   BP is severely elevated today   He says it is much better at home     I would recomm he bring cuff and BP log to HTN clinic before increasing    3 CKD Locate labs  (BMET)  4  Hx tonsillar CA   Followed by Dr Alvy Bimler    F/U in HTN clnic  Current medicines are reviewed at length with the patient today.  The patient does not have concerns regarding medicines.  Signed, [name], MD  12/29/2021 9:27 PM    [facility] [address], [place], [place]  [address] Phone: [phone]; Fax: [phone]

## 2021-12-27 NOTE — Patient Instructions (Signed)
Medication Instructions:   *If you need a refill on your cardiac medications before your next appointment, please call your pharmacy*   Lab Work:  If you have labs (blood work) drawn today and your tests are completely normal, you will receive your results only by: [place]sie (if you have MyChart) OR A paper copy in the mail If you have any lab test that is abnormal or we need to change your treatment, we will call you to review the results.   Testing/Procedures:    Follow-Up: WITH THE HTN CLINIC PHARM D BRING YOU BP CUFF AND LOG OF READINGS  At [facility], you and your health needs are our priority.  As part of our continuing mission to provide you with exceptional heart care, we have created designated Provider Care Teams.  These Care Teams include your primary Cardiologist (physician) and Advanced Practice Providers (APPs -  Physician Assistants and Nurse Practitioners) who all work together to provide you with the care you need, when you need it.  We recommend signing up for the patient portal called "MyChart".  Sign up information is provided on this After Visit Summary.  MyChart is used to connect with patients for Virtual Visits (Telemedicine).  Patients are able to view lab/test results, encounter notes, upcoming appointments, etc.  Non-urgent messages can be sent to your provider as well.   To learn more about what you can do with MyChart, go to [email or web address].     Important Information About Sugar

## 2021-12-28 ENCOUNTER — Telehealth: Payer: Self-pay | Admitting: Hematology and Oncology

## 2021-12-28 ENCOUNTER — Encounter: Payer: Self-pay | Admitting: Internal Medicine

## 2021-12-28 NOTE — Telephone Encounter (Signed)
Scheduled per 9/12 in basket, pt has been called and confirmed

## 2021-12-28 NOTE — Telephone Encounter (Signed)
Sent LOS

## 2021-12-29 ENCOUNTER — Encounter: Payer: Self-pay | Admitting: Internal Medicine

## 2022-01-06 ENCOUNTER — Inpatient Hospital Stay: Payer: Medicaid Other

## 2022-01-06 ENCOUNTER — Inpatient Hospital Stay: Payer: Medicaid Other | Attending: Hematology and Oncology | Admitting: Hematology and Oncology

## 2022-01-06 ENCOUNTER — Other Ambulatory Visit: Payer: Self-pay

## 2022-01-06 ENCOUNTER — Other Ambulatory Visit (HOSPITAL_COMMUNITY): Payer: Self-pay

## 2022-01-06 ENCOUNTER — Telehealth: Payer: Self-pay | Admitting: *Deleted

## 2022-01-06 ENCOUNTER — Encounter: Payer: Self-pay | Admitting: Hematology and Oncology

## 2022-01-06 VITALS — BP 157/96 | HR 102 | Temp 98.1°F | Resp 18 | Ht 78.0 in | Wt 281.2 lb

## 2022-01-06 DIAGNOSIS — Z91030 Bee allergy status: Secondary | ICD-10-CM | POA: Diagnosis not present

## 2022-01-06 DIAGNOSIS — E039 Hypothyroidism, unspecified: Secondary | ICD-10-CM

## 2022-01-06 DIAGNOSIS — G893 Neoplasm related pain (acute) (chronic): Secondary | ICD-10-CM | POA: Diagnosis not present

## 2022-01-06 DIAGNOSIS — G894 Chronic pain syndrome: Secondary | ICD-10-CM | POA: Insufficient documentation

## 2022-01-06 DIAGNOSIS — R21 Rash and other nonspecific skin eruption: Secondary | ICD-10-CM | POA: Insufficient documentation

## 2022-01-06 DIAGNOSIS — C099 Malignant neoplasm of tonsil, unspecified: Secondary | ICD-10-CM

## 2022-01-06 DIAGNOSIS — Z79890 Hormone replacement therapy (postmenopausal): Secondary | ICD-10-CM | POA: Insufficient documentation

## 2022-01-06 DIAGNOSIS — M542 Cervicalgia: Secondary | ICD-10-CM | POA: Insufficient documentation

## 2022-01-06 DIAGNOSIS — I2510 Atherosclerotic heart disease of native coronary artery without angina pectoris: Secondary | ICD-10-CM | POA: Diagnosis not present

## 2022-01-06 DIAGNOSIS — T50905A Adverse effect of unspecified drugs, medicaments and biological substances, initial encounter: Secondary | ICD-10-CM

## 2022-01-06 DIAGNOSIS — Z95828 Presence of other vascular implants and grafts: Secondary | ICD-10-CM

## 2022-01-06 DIAGNOSIS — N183 Chronic kidney disease, stage 3 unspecified: Secondary | ICD-10-CM

## 2022-01-06 DIAGNOSIS — J704 Drug-induced interstitial lung disorders, unspecified: Secondary | ICD-10-CM | POA: Diagnosis not present

## 2022-01-06 DIAGNOSIS — R7989 Other specified abnormal findings of blood chemistry: Secondary | ICD-10-CM | POA: Diagnosis not present

## 2022-01-06 DIAGNOSIS — R1310 Dysphagia, unspecified: Secondary | ICD-10-CM | POA: Diagnosis not present

## 2022-01-06 DIAGNOSIS — L409 Psoriasis, unspecified: Secondary | ICD-10-CM | POA: Diagnosis not present

## 2022-01-06 DIAGNOSIS — Z79899 Other long term (current) drug therapy: Secondary | ICD-10-CM | POA: Insufficient documentation

## 2022-01-06 DIAGNOSIS — I5022 Chronic systolic (congestive) heart failure: Secondary | ICD-10-CM

## 2022-01-06 DIAGNOSIS — J189 Pneumonia, unspecified organism: Secondary | ICD-10-CM

## 2022-01-06 DIAGNOSIS — J984 Other disorders of lung: Secondary | ICD-10-CM

## 2022-01-06 DIAGNOSIS — I509 Heart failure, unspecified: Secondary | ICD-10-CM | POA: Diagnosis not present

## 2022-01-06 DIAGNOSIS — Z923 Personal history of irradiation: Secondary | ICD-10-CM | POA: Insufficient documentation

## 2022-01-06 DIAGNOSIS — D539 Nutritional anemia, unspecified: Secondary | ICD-10-CM

## 2022-01-06 LAB — CMP (CANCER CENTER ONLY)
ALT: 32 U/L (ref 0–44)
AST: 35 U/L (ref 15–41)
Albumin: 4.2 g/dL (ref 3.5–5.0)
Alkaline Phosphatase: 44 U/L (ref 38–126)
Anion gap: 7 (ref 5–15)
BUN: 16 mg/dL (ref 6–20)
CO2: 29 mmol/L (ref 22–32)
Calcium: 9.5 mg/dL (ref 8.9–10.3)
Chloride: 102 mmol/L (ref 98–111)
Creatinine: 1.65 mg/dL — ABNORMAL HIGH (ref 0.61–1.24)
GFR, Estimated: 49 mL/min — ABNORMAL LOW (ref >60)
Glucose, Bld: 97 mg/dL (ref 70–99)
Potassium: 4 mmol/L (ref 3.5–5.1)
Sodium: 138 mmol/L (ref 135–145)
Total Bilirubin: 0.6 mg/dL (ref 0.3–1.2)
Total Protein: 7 g/dL (ref 6.5–8.1)

## 2022-01-06 LAB — CBC WITH DIFFERENTIAL (CANCER CENTER ONLY)
Abs Immature Granulocytes: 0.07 10*3/uL (ref 0.00–0.07)
Basophils Absolute: 0 10*3/uL (ref 0.0–0.1)
Basophils Relative: 0 %
Eosinophils Absolute: 0.1 10*3/uL (ref 0.0–0.5)
Eosinophils Relative: 1 %
HCT: 40.4 % (ref 39.0–52.0)
Hemoglobin: 13.1 g/dL (ref 13.0–17.0)
Immature Granulocytes: 1 %
Lymphocytes Relative: 11 %
Lymphs Abs: 0.7 10*3/uL (ref 0.7–4.0)
MCH: 29.4 pg (ref 26.0–34.0)
MCHC: 32.4 g/dL (ref 30.0–36.0)
MCV: 90.8 fL (ref 80.0–100.0)
Monocytes Absolute: 0.6 10*3/uL (ref 0.1–1.0)
Monocytes Relative: 9 %
Neutro Abs: 5 10*3/uL (ref 1.7–7.7)
Neutrophils Relative %: 78 %
Platelet Count: 176 10*3/uL (ref 150–400)
RBC: 4.45 MIL/uL (ref 4.22–5.81)
RDW: 14.6 % (ref 11.5–15.5)
WBC Count: 6.5 10*3/uL (ref 4.0–10.5)
nRBC: 0 % (ref 0.0–0.2)

## 2022-01-06 LAB — T4, FREE: Free T4: 1.17 ng/dL — ABNORMAL HIGH (ref 0.61–1.12)

## 2022-01-06 LAB — SEDIMENTATION RATE: Sed Rate: 13 mm/hr (ref 0–16)

## 2022-01-06 LAB — IRON AND IRON BINDING CAPACITY (CC-WL,HP ONLY)
Iron: 95 ug/dL (ref 45–182)
Saturation Ratios: 28 % (ref 17.9–39.5)
TIBC: 343 ug/dL (ref 250–450)
UIBC: 248 ug/dL (ref 117–376)

## 2022-01-06 LAB — VITAMIN B12: Vitamin B-12: 176 pg/mL — ABNORMAL LOW (ref 180–914)

## 2022-01-06 MED ORDER — SODIUM CHLORIDE 0.9% FLUSH
10.0000 mL | INTRAVENOUS | Status: AC | PRN
  Administered 2022-01-06: 10 mL

## 2022-01-06 MED ORDER — OXYCODONE HCL 15 MG PO TABS
15.0000 mg | ORAL_TABLET | Freq: Four times a day (QID) | ORAL | 0 refills | Status: DC | PRN
  Filled 2022-01-06: qty 90, 23d supply, fill #0

## 2022-01-06 MED ORDER — HEPARIN SOD (PORK) LOCK FLUSH 100 UNIT/ML IV SOLN
500.0000 [IU] | INTRAVENOUS | Status: AC | PRN
  Administered 2022-01-06: 500 [IU]

## 2022-01-06 NOTE — Assessment & Plan Note (Signed)
He has history of drug-induced pneumonitis I plan to order CT imaging of the chest for evaluation

## 2022-01-06 NOTE — Assessment & Plan Note (Signed)
He has gained a lot of weight He has not have TSH monitor for a while I will order thyroid function test and adjust his medication accordingly

## 2022-01-06 NOTE — Assessment & Plan Note (Signed)
He has diffuse skin lesions and was told he has psoriasis He was prescribed antibiotics, steroids and ointment by a dermatologist in the past Observe only for now

## 2022-01-06 NOTE — Assessment & Plan Note (Signed)
He has intermittent elevated serum creatinine For this reason, I do not plan to order CT imaging with contrast We discussed the importance of hydration and risk factor modification Observe closely

## 2022-01-06 NOTE — Assessment & Plan Note (Signed)
He is known to have cardiomyopathy His recent echocardiogram confirmed reduced ejection fraction He is currently on medical management He will follow-up with cardiology clinic for medication adjustment including blood pressure management next month

## 2022-01-06 NOTE — Progress Notes (Signed)
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 I recommend repeat CT imaging of the neck and the chest without contrast for assessment If he has no signs of cancer recurrence, I recommend port removal I will draw baseline blood work today  Acquired hypothyroidism He has gained a lot of weight He has not have TSH monitor for a while I will order thyroid function test and adjust his medication accordingly  CKD (chronic kidney disease), stage III He has intermittent elevated serum creatinine For this reason, I do not plan to order CT imaging with contrast We discussed the importance of hydration and risk factor modification Observe closely  Cancer associated pain He has chronic pain syndrome related to prior surgery and radiation I refilled his prescription oxycodone today  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 I plan to order CT imaging of the chest for evaluation  CHF (congestive heart failure) ([place]) He is known to have cardiomyopathy His recent echocardiogram confirmed reduced ejection fraction He is currently on medical management He will follow-up with cardiology clinic for medication adjustment including blood pressure management next month  Psoriasis He has diffuse skin lesions and was told he has psoriasis He was prescribed antibiotics, steroids and ointment by a dermatologist in the past Observe only for now  Orders Placed This Encounter  Procedures   CT CHEST WO CONTRAST    Standing Status:   Future    Standing Expiration  Date:   01/07/2023    Order Specific Question:   Preferred imaging location?    Answer:   [facility]    Order Specific Question:   Radiology Contrast Protocol - do NOT remove file path    Answer:   [email or web address]   CT Soft Tissue Neck Wo Contrast    Standing Status:   Future    Standing Expiration Date:   01/06/2023    Order Specific Question:   Preferred imaging location?    Answer:   [place] ([facility] only)    Standing Status:   Future    Number of Occurrences:   1    Standing Expiration Date:   01/07/2023   CBC with Differential (Cancer Center Only)    Standing Status:   Future    Number of Occurrences:   1    Standing Expiration Date:   01/07/2023   T4, free    Standing Status:   Future    Number of Occurrences:   1    Standing Expiration Date:   01/07/2023   TSH    Standing Status:   Future    Number of Occurrences:   1    Standing Expiration Date:   01/07/2023   Vitamin B12    Standing Status:   Future    Number of Occurrences:   1    Standing Expiration Date:   01/07/2023   Ferritin    Standing Status:   Future    Number of Occurrences:   1    Standing Expiration Date:   01/06/2023   Iron and Iron Binding Capacity ([facility] only)    Standing Status:  Future    Number of Occurrences:   1    Standing Expiration Date:   01/07/2023   Sedimentation rate    Standing Status:   Future    Number of Occurrences:   1    Standing Expiration Date:   01/07/2023    All questions were answered. The patient knows to call the clinic with any problems, questions or concerns. The total time spent in the appointment was 40 minutes encounter with patients including review of chart and various tests results, discussions about plan of care and coordination of care plan   [name], MD 01/06/2022 3:11 PM  INTERVAL HISTORY: Please see below for problem oriented charting. he returns for treatment follow-up with his  significant other, Rose The patient was incarcerated since her last visit and just was released a month ago He was diagnosed with psoriasis while in prison He has persistent skin rash since released from prison without medications He has chronic neck pain and needs oxycodone refill He denies recent signs or symptoms of congestive heart failure such as shortness of breath or chest pain He has occasional dysphagia He was evaluated by ENT while in the present and had CT imaging last year which was negative for recurrence He has gained over 60 pounds since his last visit here  REVIEW OF SYSTEMS:   Constitutional: Denies fevers, chills or abnormal weight loss Eyes: Denies blurriness of vision Ears, nose, mouth, throat, and face: Denies mucositis or sore throat Respiratory: Denies cough, dyspnea or wheezes Cardiovascular: Denies palpitation, chest discomfort or lower extremity swelling Gastrointestinal:  Denies nausea, heartburn or change in bowel habits Lymphatics: Denies new lymphadenopathy or easy bruising Neurological:Denies numbness, tingling or new weaknesses Behavioral/Psych: Mood is stable, no new changes  All other systems were reviewed with the patient and are negative.  I have reviewed the past medical history, past surgical history, social history and family history with the patient and they are unchanged from previous note.  ALLERGIES:  is allergic to bee pollen and pollen extract.  MEDICATIONS:  Current Outpatient Medications  Medication Sig Dispense Refill   metoprolol succinate (TOPROL-XL) 50 MG 24 hr tablet Take 75 mg by mouth daily. Take with or immediately following a meal.     omeprazole (PRILOSEC) 20 MG capsule Take 20 mg by mouth daily.     oxyCODONE (ROXICODONE) 15 MG immediate release tablet Take 1 tablet (15 mg total) by mouth every 6 (six) hours as needed for severe pain. 90 tablet 0   sacubitril-valsartan (ENTRESTO) 24-26 MG Take 1 tablet by mouth 2 (two) times  daily.     aspirin EC 81 MG tablet Take 81 mg by mouth daily.     fluticasone (FLONASE) 50 MCG/ACT nasal spray Place 2 sprays into both nostrils daily. 16 g 2   isosorbide mononitrate (IMDUR) 30 MG 24 hr tablet Take 1 tablet (30 mg total) by mouth daily. 90 tablet 3   levothyroxine (SYNTHROID) 50 MCG tablet Take 1 tablet (50 mcg total) by mouth daily before breakfast. 90 tablet 1   No current facility-administered medications for this visit.   Facility-Administered Medications Ordered in Other Visits  Medication Dose Route Frequency Provider Last Rate Last Admin   sodium chloride 0.9 % injection 10 mL  10 mL Intravenous PRN Alvy Bimler, Zahari Fazzino, MD   10 mL at 11/29/16 0818   sodium chloride 0.9 % injection 10 mL  10 mL Intravenous PRN [name], MD        SUMMARY OF  ONCOLOGIC HISTORY: Oncology History Overview Note  Tonsillar cancer, HPV positive   Primary site: Pharynx - Oropharynx (Right)   Staging method: AJCC 7th Edition   Clinical: Stage IVA (T2, N2b, M0) signed by [name], MD on 08/19/2013  1:24 PM   Summary: Stage IVA (T2, N2b, M0)     Tonsillar cancer ([place])  07/09/2013 Procedure   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 FNA of right level III lymph node was inconclusive for cancer   07/25/2013 Imaging   PET scan showed locally advanced disease with abnormal lymphadenopathy in the right axilla   08/06/2013 Procedure   CT-guided biopsy of the lymphadenopathy was negative for malignancy   08/15/2013 Surgery   Patient has placement of port and feeding tube   08/19/2013 - 09/10/2013 Chemotherapy   Patient received chemotherapy with cisplatin. The patient only received 2 doses due to uncontrolled nausea and acute renal failure.   08/19/2013 - 10/15/2013 Radiation Therapy   Received Helical IMRT Tomotherapy:  Right Tonstil and bilateral neck / 70 Gy in 35 fractions to gross disease, 63 Gy in 35 fractions to high risk nodal echelons, and 56 Gy in  35 fractions to intermediate risk nodal echelons.   08/27/2013 - 08/30/2013 Hospital Admission   The patient was admitted to the hospital for uncontrolled nausea vomiting and dehydration.   02/14/2014 Imaging   PET/CT scan showed complete response to treatment   03/19/2014 Surgery   He had excisional lymph node biopsy from the right neck. Pathology was benign   05/13/2014 Surgery   He had removal of Port-A-Cath.   05/22/2014 Imaging   Repeat CT scan of the neck show no evidence of disease recurrence.   12/09/2014 Imaging   Ct neck without contrast showed persistent abnormalities on the right side of neck, indeterminate   12/25/2014 Imaging   PET CT scan showed disease recurrence.   02/10/2015 Procedure   He has placement of port   02/13/2015 - 07/13/2015 Chemotherapy   He received chemotherapy with carbo/Taxol   04/21/2015 Imaging   PET CT scan showed improved disease control   08/04/2015 Imaging   PET scan showed persistent disease   08/17/2015 -  Chemotherapy   He was started with [facility]   10/19/2015 Imaging   Ct neck showed mass-like intermediate density soft tissue at the right lateral neck recurrence site stable.    02/26/2016 Imaging   CT neck showed unchanged appearance of right neck recurrence compared to 10/19/2015 CT. No noncontrast evidence of new metastatic disease in the neck. Chronic sinusitis, progressed.   02/26/2016 Imaging   Diffuse but patchy and asymmetric partial airspace filling process (ground-glass opacity) in the lungs. This could be due to respiratory bronchiolitis, hypersensitivity pneumonitis or possible drug reaction. Atypical/viral pneumonia is also possible. Pulmonary consultation may be a helpful. A three-month follow-up noncontrast chest CT is suggested. Slight interval enlargement of mediastinal lymph nodes and a small lymph node along the left major fissure. This is most likely due to the inflammatory process in the lungs. No findings for metastatic  disease involving the chest. No findings for upper abdominal metastatic disease.   02/29/2016 Adverse Reaction   His treatment is placed on hold due to possible hypersensitivity pneumonitis/drug reaction   04/14/2016 Imaging   Ct chest showed no evidence for metastatic disease within the chest. Significant interval improvement and near complete resolution of previously described diffuse bilateral predominately ground-glass pulmonary opacities, most compatible with resolving infectious/inflammatory process.   09/05/2016  Imaging   CT scan of neck and chest  1. Unchanged appearance of right neck recurrence. 2. No evidence of new metastatic disease in the neck. 3. Unremarkable and stable CT appearance of the chest. No findings suspicious for metastatic disease   07/24/2017 Imaging   1. No findings suspicious for metastatic disease in the chest. 2. No acute consolidative airspace disease to suggest a pneumonia. 3. No appreciable change in chronic mild patchy upper lung predominant centrilobular micronodularity. If the patient is a current smoker, these findings are most compatible with smoking related interstitial lung disease. If the patient is not a current smoker, these findings suggest subacute hypersensitivity pneumonitis  or postinflammatory change. 4. Stable mild biapical radiation fibrosis. 5. Mild to moderate centrilobular emphysema with diffuse bronchial wall thickening, suggesting COPD. 6. One vessel coronary atherosclerosis.  Aortic Atherosclerosis (ICD10-I70.0) and Emphysema (ICD10-J43.9).   09/15/2017 Imaging   1. No evidence of interstitial lung disease. 2.  Emphysema (ICD10-J43.9).   10/29/2017 Imaging   1. Moderate quality exam for pulmonary embolism with primary limitation of motion. No embolism identified. 2. Findings of congestive heart failure, including bilateral pleural effusions and septal thickening. 3. New thoracic adenopathy since approximately 6 weeks ago. Favor  secondary to fluid overload/congestive heart failure. 4. New left apical 4 mm pulmonary nodule. Favored to represent a subpleural lymph node. Non-contrast chest CT can be considered in 12 months, given risk factors for primary bronchogenic carcinoma. This recommendation follows the consensus statement: Guidelines for Management of Incidental Pulmonary Nodules Detected on CT Images: From the Fleischner Society 2017; Radiology 2017; 812:751-700.     10/29/2017 - 11/02/2017 Hospital Admission   He was admitted to the hospital due to shortness of breath and was found to have congestive heart failure.   10/30/2017 Imaging   Definity used; severe global reduction in LV systolic function; severe LVE; restrictive filling; mild MR; mild LAE; mild RVE with moderate RV dysfunction; mild TR with moderate pulmonary hypertension.   01/26/2018 Imaging   1. Regression of soft tissue in the postoperative right neck favoring scarring. No new or progressive finding to suggest recurrent disease. 2. Chest CT reported separately   01/26/2018 Imaging   1. No evidence of thoracic metastasis. 2.  Port in the anterior chest wall with tip in distal    03/12/2018 Imaging   1. Trace, silent aspiration noted after a swallow consistent with aspiration of residual barium in the hypopharynx. 2. Retrograde reflux of contrast into the posterior nasopharynx with swallowing. 3. No gross esophageal abnormality 4. Initial difficulty swallowing a 13 mm barium tablet that than passes readily into the stomach once it is swallowed.   05/31/2018 Imaging   Resolution of previously seen mucosal hyperenhancement in the pharynx and larynx. Otherwise unchanged examination of the neck without evidence of recurrent disease or cervical nodal metastases.     06/07/2018 Echocardiogram   1. The left ventricle has moderately reduced systolic function, with an ejection fraction of 35-40%. The cavity size was severely dilated. Left ventricular  diastolic Doppler parameters are consistent with impaired relaxation Left ventricular diffuse hypokinesis.  2. The right ventricle has normal systolic function. The cavity was moderately enlarged. There is no increase in right ventricular wall thickness.  3. The mitral valve is normal in structure.  4. The tricuspid valve is normal in structure.  5. The aortic valve is normal in structure.  6. The pulmonic valve was normal in structure.  7. The inferior vena cava was dilated in size with >50%  respiratory variability.   02/24/2020 Imaging   Ct neck Postsurgical changes of the right side of the neck. No evidence of recurrent disease. No lymphadenopathy   02/24/2020 Imaging   1. No evidence of metastatic disease in the chest, abdomen, or pelvis. 2. Innumerable tiny centrilobular pulmonary nodules, most concentrated in the upper lobes, likely reflecting smoking-related respiratory bronchiolitis. 3. Hepatic steatosis. 4. Aortic Atherosclerosis (ICD10-I70.0).     10/01/2021 Imaging   CT neck  The visualized portions of the brain and the posterior fossa are normal.   Mucosal thickening of the right maxillary sinus. The orbits are normal. Trace secretions in the nasopharynx. The nasal cavity and nasopharynx are otherwise unremarkable.   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 Postsurgical soft tissue thickening on the right side of the neck along the posterior triangle, carotid sheath, and parapharyngeal/mucosal pharyngeal spaces. No new or enlarging enhancing mass seen. Mild asymmetric atrophy along the right tonsillar fossa, likely from treatment of the prior mass. No discrete lesion in the oropharynx. The oropharynx and oral cavity are otherwise unremarkable. The remaining parapharyngeal spaces are clear.   Mild atrophy of the submandibular glands. The parotid glands appear normal.   The larynx and hypopharynx are normal.   There is no lymphadenopathy.   The  thyroid gland is normal.   No acute osseous abnormality. No lytic or blastic osseous lesions. Degenerative changes of the spine.   CT chest  No evidence for intrathoracic metastatic disease.   Mild diffuse bronchial wall thickening with scattered centrilobular groundglass nodules in bilateral upper lobes, may represent underlying respiratory bronchiolitis.    11/23/2021 Echocardiogram   Summary   1. Technically difficult study.    2. The left ventricle is upper normal in size with normal wall thickness.    3. The left ventricular systolic function is moderately to severely  decreased, LVEF is visually estimated at 35-40%.    4. There is grade I diastolic dysfunction (impaired relaxation).    5. The right ventricle is normal in size, with normal systolic function.      PHYSICAL EXAMINATION: ECOG PERFORMANCE STATUS: 1 - Symptomatic but completely ambulatory  Vitals:   01/06/22 1319  BP: (!) 157/96  Pulse: (!) 102  Resp: 18  Temp: 98.1 F (36.7 C)  SpO2: 99%   Filed Weights   01/06/22 1319  Weight: 281 lb 3.2 oz (127.6 kg)    GENERAL:alert, no distress and comfortable SKIN: He has diffuse skin lesions EYES: normal, Conjunctiva are pink and non-injected, sclera clear OROPHARYNX:no exudate, no erythema and lips, buccal mucosa, and tongue normal  NECK: Noted well-healed surgical scar on the right neck.  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 LYMPH:  no palpable lymphadenopathy in the cervical, axillary or inguinal LUNGS: clear to auscultation and percussion with normal breathing effort HEART: regular rate & rhythm and no murmurs and no lower extremity edema ABDOMEN:abdomen soft, non-tender and normal bowel sounds Musculoskeletal:no cyanosis of digits and no clubbing  NEURO: alert & oriented x 3 with fluent speech, no focal motor/sensory deficits  LABORATORY DATA:  I have reviewed the data as listed    Component Value Date/Time   NA 138  01/06/2022 1408   NA 141 04/20/2017 1028   K 4.0 01/06/2022 1408   K 4.3 04/20/2017 1028   CL 102 01/06/2022 1408   CO2 29 01/06/2022 1408   CO2 26 04/20/2017 1028   GLUCOSE 97 01/06/2022 1408   GLUCOSE  96 04/20/2017 1028   BUN 16 01/06/2022 1408   BUN 16.7 04/20/2017 1028   CREATININE 1.65 (H) 01/06/2022 1408   CREATININE 2.0 (H) 04/20/2017 1028   CALCIUM 9.5 01/06/2022 1408   CALCIUM 9.0 04/20/2017 1028   PROT 7.0 01/06/2022 1408   PROT 7.0 04/20/2017 1028   ALBUMIN 4.2 01/06/2022 1408   ALBUMIN 4.0 04/20/2017 1028   AST 35 01/06/2022 1408   AST 20 04/20/2017 1028   ALT 32 01/06/2022 1408   ALT 17 04/20/2017 1028   ALKPHOS 44 01/06/2022 1408   ALKPHOS 61 04/20/2017 1028   BILITOT 0.6 01/06/2022 1408   BILITOT 0.49 04/20/2017 1028   GFRNONAA 49 (L) 01/06/2022 1408   GFRAA 58 (L) 01/01/2020 1127    No results found for: "SPEP", "UPEP"  Lab Results  Component Value Date   WBC 6.5 01/06/2022   NEUTROABS 5.0 01/06/2022   HGB 13.1 01/06/2022   HCT 40.4 01/06/2022   MCV 90.8 01/06/2022   PLT 176 01/06/2022      Chemistry      Component Value Date/Time   NA 138 01/06/2022 1408   NA 141 04/20/2017 1028   K 4.0 01/06/2022 1408   K 4.3 04/20/2017 1028   CL 102 01/06/2022 1408   CO2 29 01/06/2022 1408   CO2 26 04/20/2017 1028   BUN 16 01/06/2022 1408   BUN 16.7 04/20/2017 1028   CREATININE 1.65 (H) 01/06/2022 1408   CREATININE 2.0 (H) 04/20/2017 1028      Component Value Date/Time   CALCIUM 9.5 01/06/2022 1408   CALCIUM 9.0 04/20/2017 1028   ALKPHOS 44 01/06/2022 1408   ALKPHOS 61 04/20/2017 1028   AST 35 01/06/2022 1408   AST 20 04/20/2017 1028   ALT 32 01/06/2022 1408   ALT 17 04/20/2017 1028   BILITOT 0.6 01/06/2022 1408   BILITOT 0.49 04/20/2017 1028

## 2022-01-06 NOTE — Assessment & Plan Note (Signed)
Clinically, his examination is benign I recommend repeat CT imaging of the neck and the chest without contrast for assessment If he has no signs of cancer recurrence, I recommend port removal I will draw baseline blood work today

## 2022-01-06 NOTE — Assessment & Plan Note (Signed)
He has chronic pain syndrome related to prior surgery and radiation I refilled his prescription oxycodone today

## 2022-01-06 NOTE — Telephone Encounter (Signed)
Collaborative informed this nurse Oxycodone IR 15 mg requires prior authorization per patient.  Noted pharmacy has notified CoverMyMeds.  Patient has Medicaid.  Connected with [place] [employer] [phone]) reference no.# [id number].  Completed  Medicaid Pharmacy request for Short-Acting Opioid Analgesic for provider signature to fax to [place] at [phone].

## 2022-01-07 ENCOUNTER — Encounter: Payer: Self-pay | Admitting: Hematology and Oncology

## 2022-01-07 ENCOUNTER — Other Ambulatory Visit: Payer: Self-pay

## 2022-01-07 ENCOUNTER — Other Ambulatory Visit: Payer: Self-pay | Admitting: Hematology and Oncology

## 2022-01-07 ENCOUNTER — Telehealth: Payer: Self-pay

## 2022-01-07 ENCOUNTER — Other Ambulatory Visit (HOSPITAL_COMMUNITY): Payer: Self-pay

## 2022-01-07 DIAGNOSIS — E538 Deficiency of other specified B group vitamins: Secondary | ICD-10-CM

## 2022-01-07 HISTORY — DX: Deficiency of other specified B group vitamins: E53.8

## 2022-01-07 LAB — FERRITIN: Ferritin: 65 ng/mL (ref 24–336)

## 2022-01-07 LAB — TSH: TSH: 8.329 u[IU]/mL — ABNORMAL HIGH (ref 0.350–4.500)

## 2022-01-07 MED ORDER — METOPROLOL SUCCINATE ER 50 MG PO TB24
75.0000 mg | ORAL_TABLET | Freq: Every day | ORAL | 0 refills | Status: DC
  Filled 2022-01-07 – 2022-01-08 (×2): qty 45, 30d supply, fill #0

## 2022-01-07 MED ORDER — LEVOTHYROXINE SODIUM 75 MCG PO TABS
75.0000 ug | ORAL_TABLET | Freq: Every day | ORAL | 3 refills | Status: DC
  Filled 2022-01-07: qty 30, 30d supply, fill #0
  Filled 2022-02-02: qty 30, 30d supply, fill #1
  Filled 2022-03-06: qty 30, 30d supply, fill #2
  Filled 2022-04-06 – 2022-04-19 (×2): qty 30, 30d supply, fill #3

## 2022-01-07 MED ORDER — OMEPRAZOLE 20 MG PO CPDR
20.0000 mg | DELAYED_RELEASE_CAPSULE | Freq: Every day | ORAL | 0 refills | Status: DC
  Filled 2022-01-07: qty 30, 30d supply, fill #0

## 2022-01-07 MED ORDER — ISOSORBIDE MONONITRATE ER 30 MG PO TB24
30.0000 mg | ORAL_TABLET | Freq: Every day | ORAL | 0 refills | Status: DC
  Filled 2022-01-07: qty 30, 30d supply, fill #0

## 2022-01-07 NOTE — Telephone Encounter (Signed)
Please take verbal order and refill it x 1 He should get them refilled by cardiologist next month

## 2022-01-07 NOTE — Telephone Encounter (Signed)
Called and given below message. He verbalized understanding and will pick up Rx.  He is asking if you can send Rx for omeprazole, Imdur and metoprolol (he takes a 25 mg and 50 mg tab) to [facility]?

## 2022-01-07 NOTE — Telephone Encounter (Signed)
Rx sent and called Mason [name] and he is aware. He will have cardiologist refill next month.

## 2022-01-07 NOTE — Telephone Encounter (Signed)
-----   Message from [name], MD sent at 01/07/2022  8:59 AM EDT ----- His TSH is low as predicted, might explain his excessive weight gain. Please tell him I increase synthroid to 75 mcg, sent to [facility] Also, B12 level is low, I will start him on b12 injection when I see him on 10/5, will send LOS to add inj appt

## 2022-01-08 ENCOUNTER — Other Ambulatory Visit (HOSPITAL_COMMUNITY): Payer: Self-pay

## 2022-01-18 ENCOUNTER — Ambulatory Visit (HOSPITAL_COMMUNITY)
Admission: RE | Admit: 2022-01-18 | Discharge: 2022-01-18 | Disposition: A | Discharge status: Home / Self Care | Payer: Medicaid Other | Source: Ambulatory Visit | Attending: Hematology and Oncology | Admitting: Hematology and Oncology

## 2022-01-18 ENCOUNTER — Ambulatory Visit (HOSPITAL_COMMUNITY): Admission: RE | Admit: 2022-01-18 | Payer: Medicaid Other | Source: Ambulatory Visit

## 2022-01-18 DIAGNOSIS — N183 Chronic kidney disease, stage 3 unspecified: Secondary | ICD-10-CM | POA: Insufficient documentation

## 2022-01-18 DIAGNOSIS — C099 Malignant neoplasm of tonsil, unspecified: Secondary | ICD-10-CM | POA: Insufficient documentation

## 2022-01-18 DIAGNOSIS — I5022 Chronic systolic (congestive) heart failure: Secondary | ICD-10-CM | POA: Diagnosis present

## 2022-01-20 ENCOUNTER — Inpatient Hospital Stay: Payer: Medicaid Other | Attending: Hematology and Oncology | Admitting: Hematology and Oncology

## 2022-01-20 ENCOUNTER — Inpatient Hospital Stay: Payer: Medicaid Other

## 2022-01-20 ENCOUNTER — Telehealth: Payer: Self-pay

## 2022-01-20 ENCOUNTER — Other Ambulatory Visit: Payer: Self-pay

## 2022-01-20 ENCOUNTER — Encounter: Payer: Self-pay | Admitting: Hematology and Oncology

## 2022-01-20 VITALS — BP 136/88 | HR 110 | Resp 18 | Ht 78.0 in | Wt 285.8 lb

## 2022-01-20 DIAGNOSIS — Z79899 Other long term (current) drug therapy: Secondary | ICD-10-CM | POA: Insufficient documentation

## 2022-01-20 DIAGNOSIS — Z923 Personal history of irradiation: Secondary | ICD-10-CM | POA: Diagnosis not present

## 2022-01-20 DIAGNOSIS — L989 Disorder of the skin and subcutaneous tissue, unspecified: Secondary | ICD-10-CM | POA: Diagnosis not present

## 2022-01-20 DIAGNOSIS — C099 Malignant neoplasm of tonsil, unspecified: Secondary | ICD-10-CM | POA: Diagnosis not present

## 2022-01-20 DIAGNOSIS — Z95828 Presence of other vascular implants and grafts: Secondary | ICD-10-CM | POA: Diagnosis not present

## 2022-01-20 DIAGNOSIS — E538 Deficiency of other specified B group vitamins: Secondary | ICD-10-CM | POA: Diagnosis not present

## 2022-01-20 DIAGNOSIS — G894 Chronic pain syndrome: Secondary | ICD-10-CM | POA: Insufficient documentation

## 2022-01-20 DIAGNOSIS — L409 Psoriasis, unspecified: Secondary | ICD-10-CM | POA: Insufficient documentation

## 2022-01-20 DIAGNOSIS — E039 Hypothyroidism, unspecified: Secondary | ICD-10-CM | POA: Diagnosis not present

## 2022-01-20 DIAGNOSIS — Z91030 Bee allergy status: Secondary | ICD-10-CM | POA: Diagnosis not present

## 2022-01-20 DIAGNOSIS — K760 Fatty (change of) liver, not elsewhere classified: Secondary | ICD-10-CM | POA: Diagnosis not present

## 2022-01-20 DIAGNOSIS — J439 Emphysema, unspecified: Secondary | ICD-10-CM | POA: Insufficient documentation

## 2022-01-20 DIAGNOSIS — M542 Cervicalgia: Secondary | ICD-10-CM | POA: Insufficient documentation

## 2022-01-20 DIAGNOSIS — G893 Neoplasm related pain (acute) (chronic): Secondary | ICD-10-CM | POA: Diagnosis not present

## 2022-01-20 DIAGNOSIS — Z79890 Hormone replacement therapy (postmenopausal): Secondary | ICD-10-CM | POA: Insufficient documentation

## 2022-01-20 MED ORDER — CYANOCOBALAMIN 1000 MCG/ML IJ SOLN
1000.0000 ug | Freq: Once | INTRAMUSCULAR | Status: AC
  Administered 2022-01-20: 1000 ug via INTRAMUSCULAR
  Filled 2022-01-20: qty 1

## 2022-01-20 NOTE — Assessment & Plan Note (Signed)
Recommend referral to see rheumatologist

## 2022-01-20 NOTE — Telephone Encounter (Signed)
-----   Message from [name], MD sent at 01/20/2022 11:24 AM EDT ----- Regarding: pls send rheum consult to Dr. Amil Amen

## 2022-01-20 NOTE — Telephone Encounter (Signed)
Referral faxed to Dr. Leigh Aurora with [facility] [phone]. Received fax confirmation.

## 2022-01-20 NOTE — Assessment & Plan Note (Signed)
His recent TSH was elevated and the dose of levothyroxine was adjusted This could explain part of his excessive weight gain I plan to repeat it again in 3 months

## 2022-01-20 NOTE — Assessment & Plan Note (Signed)
He has chronic pain syndrome related to prior surgery and radiation I refilled his prescription oxycodone today

## 2022-01-20 NOTE — Assessment & Plan Note (Signed)
I have reviewed multiple imaging studies with the patient He has no signs of cancer recurrence I recommend port removal I plan to see him again in 3 months for further follow-up

## 2022-01-20 NOTE — Progress Notes (Signed)
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 He has no signs of cancer recurrence I recommend port removal I plan to see him again in 3 months for further follow-up  Acquired hypothyroidism His recent TSH was elevated and the dose of levothyroxine was adjusted This could explain part of his excessive weight gain I plan to repeat it again in 3 months  Cancer associated pain He has chronic pain syndrome related to prior surgery and radiation I refilled his prescription oxycodone today  Vitamin B12 deficiency He was found to have vitamin B12 deficiency I recommend monthly B12 injection I plan to recheck it again in 3 months  Psoriasis Recommend referral to see rheumatologist  Orders Placed This Encounter  Procedures   IR REMOVAL TUN ACCESS W/ PORT W/O FL MOD SED    Standing Status:   Future    Standing Expiration Date:   01/21/2023    Order Specific Question:   Reason for exam:    Answer:   no need port    Order Specific Question:   Preferred Imaging Location?    Answer:   [facility]   Comprehensive metabolic panel    Standing Status:   Standing    Number of Occurrences:   33    Standing Expiration Date:   01/21/2023   CBC with Differential/Platelet    Standing Status:   Standing    Number of Occurrences:   22    Standing Expiration Date:   01/21/2023   TSH    Standing Status:   Standing    Number of Occurrences:   22    Standing Expiration Date:   01/21/2023   Vitamin B12    Standing Status:   Standing    Number of Occurrences:    2    Standing Expiration Date:   01/21/2023   Ambulatory referral to Rheumatology    Referral Priority:   Routine    Referral Type:   Consultation    Referral Reason:   Specialty Services Required    Referred to Provider:   Hennie Duos, MD    Requested Specialty:   Rheumatology    Number of Visits Requested:   1    All questions were answered. The patient knows to call the clinic with any problems, questions or concerns. The total time spent in the appointment was 30 minutes encounter with patients including review of chart and various tests results, discussions about plan of care and coordination of care plan   [name], MD 01/20/2022 11:24 AM  INTERVAL HISTORY: Please see below for problem oriented charting. he returns for treatment follow-up and review of test results The skin lesions are bothering him He continues to have chronic neck pain  REVIEW OF SYSTEMS:   Constitutional: Denies fevers, chills or abnormal weight loss Eyes: Denies blurriness of vision Ears, nose, mouth, throat, and face: Denies mucositis or sore throat Respiratory: Denies cough, dyspnea or wheezes Cardiovascular: Denies palpitation, chest discomfort or lower extremity swelling Gastrointestinal:  Denies nausea, heartburn or change in bowel habits Lymphatics: Denies new lymphadenopathy or easy bruising Neurological:Denies numbness, tingling  or new weaknesses Behavioral/Psych: Mood is stable, no new changes  All other systems were reviewed with the patient and are negative.  I have reviewed the past medical history, past surgical history, social history and family history with the patient and they are unchanged from previous note.  ALLERGIES:  is allergic to bee pollen and pollen extract.  MEDICATIONS:  Current Outpatient Medications  Medication Sig Dispense Refill   aspirin EC 81 MG tablet Take 81 mg by mouth daily.     fluticasone (FLONASE) 50 MCG/ACT nasal spray Place 2 sprays into both  nostrils daily. 16 g 2   isosorbide mononitrate (IMDUR) 30 MG 24 hr tablet Take 1 tablet (30 mg total) by mouth daily. 30 tablet 0   levothyroxine (SYNTHROID) 75 MCG tablet Take 1 tablet (75 mcg total) by mouth daily before breakfast. 30 tablet 3   metoprolol succinate (TOPROL-XL) 50 MG 24 hr tablet Take 1.5 tablets (75 mg total) by mouth daily. Take with or immediately following a meal. 45 tablet 0   omeprazole (PRILOSEC) 20 MG capsule Take 1 capsule (20 mg total) by mouth daily. 30 capsule 0   oxyCODONE (ROXICODONE) 15 MG immediate release tablet Take 1 tablet (15 mg total) by mouth every 6 (six) hours as needed for severe pain. 90 tablet 0   sacubitril-valsartan (ENTRESTO) 24-26 MG Take 1 tablet by mouth 2 (two) times daily.     No current facility-administered medications for this visit.   Facility-Administered Medications Ordered in Other Visits  Medication Dose Route Frequency Provider Last Rate Last Admin   cyanocobalamin (VITAMIN B12) injection 1,000 mcg  1,000 mcg Intramuscular Once Alvy Bimler, Kamoni Gentles, MD       sodium chloride 0.9 % injection 10 mL  10 mL Intravenous PRN Alvy Bimler, Brach Birdsall, MD   10 mL at 11/29/16 0818   sodium chloride 0.9 % injection 10 mL  10 mL Intravenous PRN [name], MD        SUMMARY OF ONCOLOGIC HISTORY: Oncology History Overview Note  Tonsillar cancer, HPV positive   Primary site: Pharynx - Oropharynx (Right)   Staging method: AJCC 7th Edition   Clinical: Stage IVA (T2, N2b, M0) signed by [name], MD on 08/19/2013  1:24 PM   Summary: Stage IVA (T2, N2b, M0)     Tonsillar cancer ([place])  07/09/2013 Procedure   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 FNA of right level III lymph node was inconclusive for cancer   07/25/2013 Imaging   PET scan showed locally advanced disease with abnormal lymphadenopathy in the right axilla   08/06/2013 Procedure   CT-guided biopsy of the lymphadenopathy was negative for malignancy    08/15/2013 Surgery   Patient has placement of port and feeding tube   08/19/2013 - 09/10/2013 Chemotherapy   Patient received chemotherapy with cisplatin. The patient only received 2 doses due to uncontrolled nausea and acute renal failure.   08/19/2013 - 10/15/2013 Radiation Therapy   Received Helical IMRT Tomotherapy:  Right Tonstil and bilateral neck / 70 Gy in 35 fractions to gross disease, 63 Gy in 35 fractions to high risk nodal echelons, and 56 Gy in 35 fractions to intermediate risk nodal echelons.   08/27/2013 - 08/30/2013 Hospital Admission   The patient was admitted to the hospital for uncontrolled nausea vomiting and dehydration.   02/14/2014 Imaging   PET/CT scan showed complete response to treatment   03/19/2014 Surgery   He had excisional lymph node biopsy from the right neck.  Pathology was benign   05/13/2014 Surgery   He had removal of Port-A-Cath.   05/22/2014 Imaging   Repeat CT scan of the neck show no evidence of disease recurrence.   12/09/2014 Imaging   Ct neck without contrast showed persistent abnormalities on the right side of neck, indeterminate   12/25/2014 Imaging   PET CT scan showed disease recurrence.   02/10/2015 Procedure   He has placement of port   02/13/2015 - 07/13/2015 Chemotherapy   He received chemotherapy with carbo/Taxol   04/21/2015 Imaging   PET CT scan showed improved disease control   08/04/2015 Imaging   PET scan showed persistent disease   08/17/2015 -  Chemotherapy   He was started with [facility]   10/19/2015 Imaging   Ct neck showed mass-like intermediate density soft tissue at the right lateral neck recurrence site stable.    02/26/2016 Imaging   CT neck showed unchanged appearance of right neck recurrence compared to 10/19/2015 CT. No noncontrast evidence of new metastatic disease in the neck. Chronic sinusitis, progressed.   02/26/2016 Imaging   Diffuse but patchy and asymmetric partial airspace filling process (ground-glass opacity)  in the lungs. This could be due to respiratory bronchiolitis, hypersensitivity pneumonitis or possible drug reaction. Atypical/viral pneumonia is also possible. Pulmonary consultation may be a helpful. A three-month follow-up noncontrast chest CT is suggested. Slight interval enlargement of mediastinal lymph nodes and a small lymph node along the left major fissure. This is most likely due to the inflammatory process in the lungs. No findings for metastatic disease involving the chest. No findings for upper abdominal metastatic disease.   02/29/2016 Adverse Reaction   His treatment is placed on hold due to possible hypersensitivity pneumonitis/drug reaction   04/14/2016 Imaging   Ct chest showed no evidence for metastatic disease within the chest. Significant interval improvement and near complete resolution of previously described diffuse bilateral predominately ground-glass pulmonary opacities, most compatible with resolving infectious/inflammatory process.   09/05/2016 Imaging   CT scan of neck and chest  1. Unchanged appearance of right neck recurrence. 2. No evidence of new metastatic disease in the neck. 3. Unremarkable and stable CT appearance of the chest. No findings suspicious for metastatic disease   07/24/2017 Imaging   1. No findings suspicious for metastatic disease in the chest. 2. No acute consolidative airspace disease to suggest a pneumonia. 3. No appreciable change in chronic mild patchy upper lung predominant centrilobular micronodularity. If the patient is a current smoker, these findings are most compatible with smoking related interstitial lung disease. If the patient is not a current smoker, these findings suggest subacute hypersensitivity pneumonitis  or postinflammatory change. 4. Stable mild biapical radiation fibrosis. 5. Mild to moderate centrilobular emphysema with diffuse bronchial wall thickening, suggesting COPD. 6. One vessel coronary atherosclerosis.  Aortic  Atherosclerosis (ICD10-I70.0) and Emphysema (ICD10-J43.9).   09/15/2017 Imaging   1. No evidence of interstitial lung disease. 2.  Emphysema (ICD10-J43.9).   10/29/2017 Imaging   1. Moderate quality exam for pulmonary embolism with primary limitation of motion. No embolism identified. 2. Findings of congestive heart failure, including bilateral pleural effusions and septal thickening. 3. New thoracic adenopathy since approximately 6 weeks ago. Favor secondary to fluid overload/congestive heart failure. 4. New left apical 4 mm pulmonary nodule. Favored to represent a subpleural lymph node. Non-contrast chest CT can be considered in 12 months, given risk factors for primary bronchogenic carcinoma. This recommendation follows the consensus statement: Guidelines for Management of Incidental Pulmonary Nodules Detected on  CT Images: From the Fleischner Society 2017; Radiology 2017; [phone].     10/29/2017 - 11/02/2017 Hospital Admission   He was admitted to the hospital due to shortness of breath and was found to have congestive heart failure.   10/30/2017 Imaging   Definity used; severe global reduction in LV systolic function; severe LVE; restrictive filling; mild MR; mild LAE; mild RVE with moderate RV dysfunction; mild TR with moderate pulmonary hypertension.   01/26/2018 Imaging   1. Regression of soft tissue in the postoperative right neck favoring scarring. No new or progressive finding to suggest recurrent disease. 2. Chest CT reported separately   01/26/2018 Imaging   1. No evidence of thoracic metastasis. 2.  Port in the anterior chest wall with tip in distal    03/12/2018 Imaging   1. Trace, silent aspiration noted after a swallow consistent with aspiration of residual barium in the hypopharynx. 2. Retrograde reflux of contrast into the posterior nasopharynx with swallowing. 3. No gross esophageal abnormality 4. Initial difficulty swallowing a 13 mm barium tablet that than passes  readily into the stomach once it is swallowed.   05/31/2018 Imaging   Resolution of previously seen mucosal hyperenhancement in the pharynx and larynx. Otherwise unchanged examination of the neck without evidence of recurrent disease or cervical nodal metastases.     06/07/2018 Echocardiogram   1. The left ventricle has moderately reduced systolic function, with an ejection fraction of 35-40%. The cavity size was severely dilated. Left ventricular diastolic Doppler parameters are consistent with impaired relaxation Left ventricular diffuse hypokinesis.  2. The right ventricle has normal systolic function. The cavity was moderately enlarged. There is no increase in right ventricular wall thickness.  3. The mitral valve is normal in structure.  4. The tricuspid valve is normal in structure.  5. The aortic valve is normal in structure.  6. The pulmonic valve was normal in structure.  7. The inferior vena cava was dilated in size with >50% respiratory variability.   02/24/2020 Imaging   Ct neck Postsurgical changes of the right side of the neck. No evidence of recurrent disease. No lymphadenopathy   02/24/2020 Imaging   1. No evidence of metastatic disease in the chest, abdomen, or pelvis. 2. Innumerable tiny centrilobular pulmonary nodules, most concentrated in the upper lobes, likely reflecting smoking-related respiratory bronchiolitis. 3. Hepatic steatosis. 4. Aortic Atherosclerosis (ICD10-I70.0).     10/01/2021 Imaging   CT neck  The visualized portions of the brain and the posterior fossa are normal.   Mucosal thickening of the right maxillary sinus. The orbits are normal. Trace secretions in the nasopharynx. The nasal cavity and nasopharynx are otherwise unremarkable.   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 Postsurgical soft tissue thickening on the right side of the neck along the posterior triangle, carotid sheath, and parapharyngeal/mucosal pharyngeal  spaces. No new or enlarging enhancing mass seen. Mild asymmetric atrophy along the right tonsillar fossa, likely from treatment of the prior mass. No discrete lesion in the oropharynx. The oropharynx and oral cavity are otherwise unremarkable. The remaining parapharyngeal spaces are clear.   Mild atrophy of the submandibular glands. The parotid glands appear normal.   The larynx and hypopharynx are normal.   There is no lymphadenopathy.   The thyroid gland is normal.   No acute osseous abnormality. No lytic or blastic osseous lesions. Degenerative changes of the spine.   CT chest  No evidence for intrathoracic metastatic disease.   Mild diffuse bronchial wall thickening  with scattered centrilobular groundglass nodules in bilateral upper lobes, may represent underlying respiratory bronchiolitis.    11/23/2021 Echocardiogram   Summary   1. Technically difficult study.    2. The left ventricle is upper normal in size with normal wall thickness.    3. The left ventricular systolic function is moderately to severely  decreased, LVEF is visually estimated at 35-40%.    4. There is grade I diastolic dysfunction (impaired relaxation).    5. The right ventricle is normal in size, with normal systolic function.    01/20/2022 Imaging   1. Stable emphysematous changes but no acute overlying pulmonary process or worrisome pulmonary lesions to suggest metastatic disease. 2. No mediastinal or hilar mass or adenopathy. 3. Diffuse fatty infiltration of the liver.   Emphysema (ICD10-J43.9).   01/20/2022 Imaging   Within the limitations of a non-contrast enhanced exam,   1. Postsurgical changes in the right neck without evidence of recurrent disease. There is persistent soft tissue thickening in the right level 2A lymph node station, which grossly appears unchanged compared to 02/24/2020 CT of the neck. 2. No new lymphadenopathy.       PHYSICAL EXAMINATION: ECOG PERFORMANCE STATUS: 1 -  Symptomatic but completely ambulatory  Vitals:   01/20/22 1105  BP: 136/88  Pulse: (!) 110  Resp: 18  SpO2: 97%   Filed Weights   01/20/22 1105  Weight: 285 lb 12.8 oz (129.6 kg)    GENERAL:alert, no distress and comfortable NEURO: alert & oriented x 3 with fluent speech, no focal motor/sensory deficits  LABORATORY DATA:  I have reviewed the data as listed    Component Value Date/Time   NA 138 01/06/2022 1408   NA 141 04/20/2017 1028   K 4.0 01/06/2022 1408   K 4.3 04/20/2017 1028   CL 102 01/06/2022 1408   CO2 29 01/06/2022 1408   CO2 26 04/20/2017 1028   GLUCOSE 97 01/06/2022 1408   GLUCOSE 96 04/20/2017 1028   BUN 16 01/06/2022 1408   BUN 16.7 04/20/2017 1028   CREATININE 1.65 (H) 01/06/2022 1408   CREATININE 2.0 (H) 04/20/2017 1028   CALCIUM 9.5 01/06/2022 1408   CALCIUM 9.0 04/20/2017 1028   PROT 7.0 01/06/2022 1408   PROT 7.0 04/20/2017 1028   ALBUMIN 4.2 01/06/2022 1408   ALBUMIN 4.0 04/20/2017 1028   AST 35 01/06/2022 1408   AST 20 04/20/2017 1028   ALT 32 01/06/2022 1408   ALT 17 04/20/2017 1028   ALKPHOS 44 01/06/2022 1408   ALKPHOS 61 04/20/2017 1028   BILITOT 0.6 01/06/2022 1408   BILITOT 0.49 04/20/2017 1028   GFRNONAA 49 (L) 01/06/2022 1408   GFRAA 58 (L) 01/01/2020 1127    No results found for: "SPEP", "UPEP"  Lab Results  Component Value Date   WBC 6.5 01/06/2022   NEUTROABS 5.0 01/06/2022   HGB 13.1 01/06/2022   HCT 40.4 01/06/2022   MCV 90.8 01/06/2022   PLT 176 01/06/2022      Chemistry      Component Value Date/Time   NA 138 01/06/2022 1408   NA 141 04/20/2017 1028   K 4.0 01/06/2022 1408   K 4.3 04/20/2017 1028   CL 102 01/06/2022 1408   CO2 29 01/06/2022 1408   CO2 26 04/20/2017 1028   BUN 16 01/06/2022 1408   BUN 16.7 04/20/2017 1028   CREATININE 1.65 (H) 01/06/2022 1408   CREATININE 2.0 (H) 04/20/2017 1028      Component Value Date/Time  CALCIUM 9.5 01/06/2022 1408   CALCIUM 9.0 04/20/2017 1028   ALKPHOS 44  01/06/2022 1408   ALKPHOS 61 04/20/2017 1028   AST 35 01/06/2022 1408   AST 20 04/20/2017 1028   ALT 32 01/06/2022 1408   ALT 17 04/20/2017 1028   BILITOT 0.6 01/06/2022 1408   BILITOT 0.49 04/20/2017 1028       RADIOGRAPHIC STUDIES: I have reviewed imaging study with the patient I have personally reviewed the radiological images as listed and agreed with the findings in the report. CT CHEST WO CONTRAST  Result Date: 01/20/2022 CLINICAL DATA:  History of tonsillar cancer status post surgery, radiation and chemotherapy. EXAM: CT CHEST WITHOUT CONTRAST TECHNIQUE: Multidetector CT imaging of the chest was performed following the standard protocol without IV contrast. RADIATION DOSE REDUCTION: This exam was performed according to the departmental dose-optimization program which includes automated exposure control, adjustment of the mA and/or kV according to patient size and/or use of iterative reconstruction technique. COMPARISON:  Chest CT 02/24/2020 FINDINGS: Cardiovascular: The heart is normal in size. No pericardial effusion. The aorta is normal in caliber. No significant atherosclerotic calcification. The right IJ Port-A-Cath is stable. Mediastinum/Nodes: No mediastinal or hilar mass or lymphadenopathy. Small scattered lymph nodes are stable. The esophagus is grossly normal. Lungs/Pleura: Stable underlying emphysematous changes but no acute overlying pulmonary process. No worrisome pulmonary lesions or pulmonary nodules to suggest metastatic disease. No pleural effusions or pleural nodules. Upper Abdomen: Diffuse fatty infiltration of the liver is noted. No hepatic lesions. No adrenal gland lesions. No upper abdominal adenopathy. Musculoskeletal: No chest wall mass, supraclavicular or axillary adenopathy. The bony thorax is intact. IMPRESSION: 1. Stable emphysematous changes but no acute overlying pulmonary process or worrisome pulmonary lesions to suggest metastatic disease. 2. No mediastinal or  hilar mass or adenopathy. 3. Diffuse fatty infiltration of the liver. Emphysema (ICD10-J43.9). Electronically Signed   By: Marijo Sanes M.D.   On: 01/20/2022 10:35   CT Soft Tissue Neck Wo Contrast  Result Date: 01/20/2022 CLINICAL DATA:  Patient history of right tonsillar squamous cell carcinoma and an inconclusive biopsy of a right level 3 lymph. Patient reportedly has a prior CT neck dated 10/01/2021 which did not show new findings. EXAM: CT NECK WITHOUT CONTRAST TECHNIQUE: Multidetector CT imaging of the neck was performed following the standard protocol without intravenous contrast. RADIATION DOSE REDUCTION: This exam was performed according to the departmental dose-optimization program which includes automated exposure control, adjustment of the mA and/or kV according to patient size and/or use of iterative reconstruction technique. COMPARISON:  CT neck 02/24/2020 FINDINGS: Pharynx and larynx: Normal. No mass or swelling. Salivary glands: There is fatty atrophy of the bilateral parotid and submandibular glands. Thyroid: Normal. Lymph nodes: None enlarged or abnormal density. Vascular: Lack of IV contrast limits ability to assess for vascular abnormalities. Limited intracranial: Negative. Visualized orbits: Negative. Mastoids and visualized paranasal sinuses: Persistent mucosal thickening in the bilateral maxillary sinuses, right-greater-than-left. There is outlet obstruction on the right. Mucosal thickening is also seen in the bilateral ethmoid sinuses, right-greater-than-left. No mastoid effusion. Skeleton: No acute or aggressive process. Upper chest: There is a right chest tunneled central venous catheter in place. No focal nodules are seen in the visualized lung apices. Other: There postsurgical changes in the right neck, along the right sternocleidomastoid muscle body, and in the right level 2A lymph node station. Assessment of the region is limited in the absence of IV contrast. There is persistent  soft tissue thickening in this region which  grossly appears unchanged compared to 02/24/2020 CT of the neck. Patient is edentulous. IMPRESSION: Within the limitations of a non-contrast enhanced exam, 1. Postsurgical changes in the right neck without evidence of recurrent disease. There is persistent soft tissue thickening in the right level 2A lymph node station, which grossly appears unchanged compared to 02/24/2020 CT of the neck. 2. No new lymphadenopathy. Electronically Signed   By: Marin Roberts M.D.   On: 01/20/2022 10:34

## 2022-01-20 NOTE — Assessment & Plan Note (Signed)
He was found to have vitamin B12 deficiency I recommend monthly B12 injection I plan to recheck it again in 3 months

## 2022-02-01 ENCOUNTER — Ambulatory Visit: Payer: Medicaid Other

## 2022-02-02 ENCOUNTER — Other Ambulatory Visit: Payer: Self-pay | Admitting: Hematology and Oncology

## 2022-02-02 ENCOUNTER — Other Ambulatory Visit (HOSPITAL_COMMUNITY): Payer: Self-pay

## 2022-02-02 MED ORDER — ISOSORBIDE MONONITRATE ER 30 MG PO TB24
30.0000 mg | ORAL_TABLET | Freq: Every day | ORAL | 0 refills | Status: DC
  Filled 2022-02-02: qty 30, 30d supply, fill #0

## 2022-02-02 MED ORDER — METOPROLOL SUCCINATE ER 50 MG PO TB24
75.0000 mg | ORAL_TABLET | Freq: Every day | ORAL | 0 refills | Status: DC
  Filled 2022-02-02: qty 45, 30d supply, fill #0

## 2022-02-02 MED ORDER — OXYCODONE HCL 15 MG PO TABS
15.0000 mg | ORAL_TABLET | Freq: Four times a day (QID) | ORAL | 0 refills | Status: DC | PRN
  Filled 2022-02-02: qty 90, 23d supply, fill #0

## 2022-02-02 MED ORDER — OMEPRAZOLE 20 MG PO CPDR
20.0000 mg | DELAYED_RELEASE_CAPSULE | Freq: Every day | ORAL | 0 refills | Status: DC
  Filled 2022-02-02: qty 30, 30d supply, fill #0

## 2022-02-04 ENCOUNTER — Other Ambulatory Visit (HOSPITAL_COMMUNITY): Payer: Medicaid Other

## 2022-02-09 ENCOUNTER — Encounter: Payer: Self-pay | Admitting: Hematology and Oncology

## 2022-02-10 ENCOUNTER — Telehealth: Payer: Self-pay

## 2022-02-10 ENCOUNTER — Other Ambulatory Visit: Payer: Self-pay | Admitting: Radiology

## 2022-02-10 ENCOUNTER — Other Ambulatory Visit (HOSPITAL_COMMUNITY): Payer: Self-pay

## 2022-02-10 ENCOUNTER — Other Ambulatory Visit: Payer: Self-pay | Admitting: Hematology and Oncology

## 2022-02-10 MED ORDER — CYANOCOBALAMIN 1000 MCG/ML IJ SOLN
1000.0000 ug | INTRAMUSCULAR | 0 refills | Status: DC
  Filled 2022-02-10: qty 1, 30d supply, fill #0
  Filled 2022-03-01 – 2022-03-06 (×2): qty 1, 30d supply, fill #1
  Filled 2022-04-06 – 2022-04-19 (×2): qty 1, 30d supply, fill #2
  Filled 2022-06-20: qty 1, 30d supply, fill #3
  Filled 2022-08-03: qty 1, 30d supply, fill #4
  Filled 2022-08-24 – 2022-08-30 (×2): qty 1, 30d supply, fill #5
  Filled 2022-09-28: qty 1, 30d supply, fill #6
  Filled 2022-12-03: qty 1, 30d supply, fill #7
  Filled 2022-12-26: qty 1, 30d supply, fill #8

## 2022-02-10 NOTE — Telephone Encounter (Signed)
I sent prescription to [facility] and canceled all his inj appt

## 2022-02-10 NOTE — Telephone Encounter (Signed)
Called regarding mychart message. He ask that the B12 injection be sent to [facility] outpatient pharmacy. He will pick it up tomorrow.

## 2022-02-11 ENCOUNTER — Ambulatory Visit (HOSPITAL_COMMUNITY)
Admission: RE | Admit: 2022-02-11 | Discharge: 2022-02-11 | Disposition: A | Discharge status: Home / Self Care | Payer: Medicaid Other | Source: Ambulatory Visit | Attending: Hematology and Oncology | Admitting: Hematology and Oncology

## 2022-02-11 ENCOUNTER — Encounter (HOSPITAL_COMMUNITY): Payer: Self-pay

## 2022-02-11 DIAGNOSIS — Z452 Encounter for adjustment and management of vascular access device: Secondary | ICD-10-CM | POA: Insufficient documentation

## 2022-02-11 DIAGNOSIS — Z95828 Presence of other vascular implants and grafts: Secondary | ICD-10-CM

## 2022-02-11 HISTORY — PX: IR REMOVAL TUN ACCESS W/ PORT W/O FL MOD SED: IMG2290

## 2022-02-11 MED ORDER — SODIUM CHLORIDE 0.9 % IV SOLN: INTRAVENOUS | Status: DC

## 2022-02-11 MED ORDER — LIDOCAINE-EPINEPHRINE 1 %-1:100000 IJ SOLN
INTRAMUSCULAR | Status: AC
  Administered 2022-02-11: 20 mL
  Filled 2022-02-11: qty 1

## 2022-02-11 NOTE — Procedures (Signed)
Interventional Radiology Procedure:   Indications: Port is no longer needed   Procedure: Port removal  Findings: Complete removal of right chest port  Complications: No immediate complications noted.     EBL: Minimal  Plan: Keep incision dry for 24 hours   Mason Hetz R. Anselm Pancoast, MD  Pager: [phone]

## 2022-02-11 NOTE — Discharge Instructions (Signed)
Implanted [place], Care After The following information offers guidance on how to care for yourself after your procedure. Your health care provider may also give you more specific instructions. If you have problems or questions, contact your health care provider.  Urgent needs - Interventional Radiology clinic [phone] Wound - May remove dressing and shower in 24 to 48 hours.  Keep site clean and dry.  Replace with bandaid as needed.  Do not submerge in tub or water until site healing well. If closed with glue, glue will flake off on its own.  What can I expect after the procedure? After the procedure, it is common to have: Soreness or pain near your incision. Some swelling or bruising near your incision. Follow these instructions at home: Medicines Take over-the-counter and prescription medicines only as told by your health care provider. If you were prescribed an antibiotic medicine, take it as told by your health care provider. Do not stop taking the antibiotic even if you start to feel better. Bathing Do not take baths, swim, or use a hot tub until your health care provider approves. Ask your health care provider if you can take showers. You may only be allowed to take sponge baths. Incision care Follow instructions from your health care provider about how to take care of your incision. Make sure you: Wash your hands with soap and water for at least 20 seconds before and after you change your bandage (dressing). If soap and water are not available, use hand sanitizer. Change your dressing as told by your health care provider. Keep your dressing dry. Leave stitches (sutures), skin glue, or adhesive strips in place. These skin closures may need to stay in place for 2 weeks or longer. If adhesive strip edges start to loosen and curl up, you may trim the loose edges. Do not remove adhesive strips completely unless your health care provider tells you to do that. Check your incision  area every day for signs of infection. Check for: More redness, swelling, or pain. More fluid or blood. Warmth. Pus or a bad smell. Activity Return to your normal activities as told by your health care provider. Ask your health care provider what activities are safe for you. You may have to avoid lifting. Ask your health care provider how much you can safely lift. Do not do activities that involve lifting your arms over your head. Driving If you were given a sedative during the procedure, it can affect you for several hours. Do not drive or operate machinery until your health care provider says that it is safe. If you did not receive a sedative, ask your health care provider when it is safe to drive. General instructions Do not use any products that contain nicotine or tobacco. These products include cigarettes, chewing tobacco, and vaping devices, such as e-cigarettes. These can delay healing after surgery. If you need help quitting, ask your health care provider. Keep all follow-up visits. This is important. Contact a health care provider if: You have a fever or chills. You have more redness, swelling, or pain around your incision. You have more fluid or blood coming from your incision. Your incision feels warm to the touch. You have pus or a bad smell coming from your incision. You have pain that is not relieved by your pain medicine. Get help right away if: You have chest pain. You have difficulty breathing. These symptoms may be an emergency. Get help right away. Call 911. Do not wait to see if  the symptoms will go away. Do not drive yourself to the hospital. Summary After the procedure, it is common to have pain, soreness, swelling, or bruising near your incision. If you were prescribed an antibiotic medicine, take it as told by your health care provider. Do not stop taking the antibiotic even if you start to feel better. If you were given a sedative during the procedure, it can  affect you for several hours. Do not drive or operate machinery until your health care provider says that it is safe. Return to your normal activities as told by your health care provider. Ask your health care provider what activities are safe for you. This information is not intended to replace advice given to you by your health care provider. Make sure you discuss any questions you have with your health care provider. Document Revised: 10/06/2020 Document Reviewed: 10/06/2020 Elsevier Patient Education  [place].

## 2022-02-12 ENCOUNTER — Other Ambulatory Visit (HOSPITAL_COMMUNITY): Payer: Self-pay

## 2022-02-17 ENCOUNTER — Inpatient Hospital Stay: Payer: Medicaid Other

## 2022-03-01 ENCOUNTER — Other Ambulatory Visit: Payer: Self-pay | Admitting: Hematology and Oncology

## 2022-03-01 ENCOUNTER — Other Ambulatory Visit (HOSPITAL_COMMUNITY): Payer: Self-pay

## 2022-03-01 MED ORDER — OXYCODONE HCL 15 MG PO TABS
15.0000 mg | ORAL_TABLET | Freq: Four times a day (QID) | ORAL | 0 refills | Status: DC | PRN
  Filled 2022-03-01: qty 90, 23d supply, fill #0

## 2022-03-02 ENCOUNTER — Other Ambulatory Visit (HOSPITAL_COMMUNITY): Payer: Self-pay

## 2022-03-06 ENCOUNTER — Other Ambulatory Visit: Payer: Self-pay | Admitting: Hematology and Oncology

## 2022-03-07 ENCOUNTER — Other Ambulatory Visit (HOSPITAL_COMMUNITY): Payer: Self-pay

## 2022-03-07 MED ORDER — ISOSORBIDE MONONITRATE ER 30 MG PO TB24
30.0000 mg | ORAL_TABLET | Freq: Every day | ORAL | 0 refills | Status: DC
  Filled 2022-03-07: qty 30, 30d supply, fill #0

## 2022-03-07 MED ORDER — METOPROLOL SUCCINATE ER 50 MG PO TB24
75.0000 mg | ORAL_TABLET | Freq: Every day | ORAL | 0 refills | Status: DC
  Filled 2022-03-07: qty 45, 30d supply, fill #0

## 2022-03-07 MED ORDER — OMEPRAZOLE 20 MG PO CPDR
20.0000 mg | DELAYED_RELEASE_CAPSULE | Freq: Every day | ORAL | 0 refills | Status: DC
  Filled 2022-03-07 – 2022-04-21 (×9): qty 30, 30d supply, fill #0

## 2022-03-13 NOTE — Progress Notes (Signed)
Patient ID: Mason [name]                 DOB: 03-23-1966                      MRN: [id number]     HPI: Mason [name] is a 56 y.o. male referred by Dr. Harrington Challenger to HTN clinic. PMH is significant for CHF, drug-induced pneumonitis, emphysema, restrictive lung disease, hypothyroidism, gout, CKDIII, HTN, MDD, and tonsillar cancer. Patient has a history of NICM with LVEF in 2019 of 15% with reduced RV function. He was initially started on carvedilol, hydralazine, and a nitrate. He was seen by Dr. Burt Knack in 2020 for L sided chest pain. Repeat echo in 05/2018 showed LVEF improved to 35-40% with enlarged but normally functioning RV. After that, patient had stopped taking his hydralazine and nitrate and echo on 07/2018 showed LVEF was again reduced to 25-30%. Hydralazine and Imdur were started and LVEF on 11/2021 was up to 35-40%. Patient was seen by [facility] cardiology on 11/23/21 per Care Everywhere and was noted to be on Coreg and Imdur with no hydralazine. Patient was last seen by Dr. Harrington Challenger on 12/27/2021 where his BP was 150/102 mmHg. At that time, Mr. Sitzmann reported normal BPs at home. He was referred to PharmD HTN clinic to bring his BP cuff for validation and titration of medications if indicated.  Patient reports to clinic today in good spirits. He denies any shortness of breath, chest pain, palpitations, fatigue, dizziness, or headache today. He becomes short of breath only with exertion, especially during sex, but is able to complete all ADLs without shortness of breath. Mild peripheral edema on exam.   He brought his medications to clinic today. Was able to verify the medication list below. He reports that while incarcerated, his medication list was changed significantly. He denies any issues with current medications except for wanting to take medications for erectile dysfunction. He is aware not to take any ED medications with Imdur.   He reports drinking ~8 water bottles per day and denies strict  adherence to a heart healthy diet. He monitors blood pressure daily, but does not do so at specific times of the day and does not generally rest beforehand. He reports his BP varies based on stress level. He did not bring his cuff to clinic today. His most recent weight is 298 lbs per report and has been increasing since his thyroid medication was reduced. He has since had his levothyroxine dose increased, but has not picked it up yet.   Current HTN meds: Imdur 30 mg daily, Toprol-XL 75 mg daily, Entresto 24-26 mg BID Previously tried: Hydralazine (not stopped for any ADE) BP goal: <130/80 mmHg  Family History:  Family History  Problem Relation Age of Onset   Arthritis Mother    Heart disease Mother    Arthritis Father    CVA Other    Diabetes Other    Heart attack Other    Heart disease Maternal Grandmother    CVA Maternal Grandmother     Social History:  Social History   Socioeconomic History   Marital status: Divorced    Spouse name: Not on file   Number of children: 1   Years of education: Not on file   Highest education level: Not on file  Occupational History   Not on file  Tobacco Use   Smoking status: Former    Packs/day: 1.00    Years: 20.00  Total pack years: 20.00    Types: Cigarettes    Quit date: 07/16/2003    Years since quitting: 18.6   Smokeless tobacco: Never  Vaping Use   Vaping Use: Never used  Substance and Sexual Activity   Alcohol use: No    Comment: none years ago   Drug use: Yes    Types: Marijuana, Benzodiazepines    Comment: years ago   Sexual activity: Never  Other Topics Concern   Not on file  Social History Narrative   Not on file   Social Determinants of Health   Financial Resource Strain: Not on file  Food Insecurity: Not on file  Transportation Needs: Not on file  Physical Activity: Not on file  Stress: Not on file  Social Connections: Not on file    Diet: Patient reports diet is high in salt and includes ~8 water bottles  per day. Discussed general tips for a heart healthy diet and salt substitutes.  Exercise: Patient reports walking with his wife several times per week  Home BP readings:  138/88 195/90 130/85 180/100 110/74  Wt Readings from Last 3 Encounters:  01/20/22 285 lb 12.8 oz (129.6 kg)  01/06/22 281 lb 3.2 oz (127.6 kg)  12/27/21 280 lb (127 kg)   BP Readings from Last 3 Encounters:  02/11/22 (!) 137/103  01/20/22 136/88  01/06/22 (!) 157/96   Pulse Readings from Last 3 Encounters:  02/11/22 81  01/20/22 (!) 110  01/06/22 (!) 102    Renal function: CrCl cannot be calculated (Patient's most recent lab result is older than the maximum 21 days allowed.).  Past Medical History:  Diagnosis Date   Abnormal liver function test    Acute sinusitis, unspecified 05/18/2015   Anemia    Anxiety    mild new dx   Arthritis    knees,hips   Bilateral edema of lower extremity    Complication of anesthesia    Pt stated " my oxygen level was slow in rising."   Concussion    Hx: in high school x 2   Constipation    Dysuria 09/04/2015   Fever    Hyperactive gag reflex    Hypertension    Hypoglycemia    Hypokalemia    Insomnia 06/15/2015   Knee pain, chronic    Malnutrition ([place])    Non-healing surgical wound 05/23/2014   PEG (percutaneous endoscopic gastrostomy) status ([place])    Renal failure, acute ([place])    S/P radiation therapy 08/19/2013-10/15/2013   Right Tonstil and bilateral neck / 70 Gy in 35 fractions to gross disease, 63 Gy in 35 fractions to high risk nodal echelons, and 56 Gy in 35 fractions to intermediate risk nodal echelons   Severe nausea and vomiting    Status post chemotherapy    Only received 2 doses due to uncontrolled nausea and acute renal failure   Tonsillar cancer ([place]) 07/09/2013   SCCA of Right Tonsil, recurrent 2016   Vitamin B12 deficiency 01/07/2022    Current Outpatient Medications on File Prior to Visit  Medication Sig Dispense Refill   aspirin EC 81  MG tablet Take 81 mg by mouth daily.     cyanocobalamin (VITAMIN B12) 1000 MCG/ML injection Inject 1 mL (1,000 mcg total) into the muscle every 30 (thirty) days. 10 mL 0   fluticasone (FLONASE) 50 MCG/ACT nasal spray Place 2 sprays into both nostrils daily. 16 g 2   isosorbide mononitrate (IMDUR) 30 MG 24 hr tablet Take 1 tablet (30  mg total) by mouth daily. 30 tablet 0   levothyroxine (SYNTHROID) 75 MCG tablet Take 1 tablet (75 mcg total) by mouth daily before breakfast. 30 tablet 3   metoprolol succinate (TOPROL-XL) 50 MG 24 hr tablet Take 1 and 1/2 tablets (75 mg total) by mouth daily. Take with or immediately following a meal. 45 tablet 0   omeprazole (PRILOSEC) 20 MG capsule Take 1 capsule (20 mg total) by mouth daily. 30 capsule 0   oxyCODONE (ROXICODONE) 15 MG immediate release tablet Take 1 tablet (15 mg total) by mouth every 6 (six) hours as needed. 90 tablet 0   sacubitril-valsartan (ENTRESTO) 24-26 MG Take 1 tablet by mouth 2 (two) times daily.     Current Facility-Administered Medications on File Prior to Visit  Medication Dose Route Frequency Provider Last Rate Last Admin   sodium chloride 0.9 % injection 10 mL  10 mL Intravenous PRN Alvy Bimler, Ni, MD   10 mL at 11/29/16 0818   sodium chloride 0.9 % injection 10 mL  10 mL Intravenous PRN [name], MD        Allergies  Allergen Reactions   Bee Pollen     Watery eyes, runny nose, sneezing   Pollen Extract Other (See Comments)    Watery eyes, runny nose, sneezing     Assessment/Plan:  1. Hypertension - Blood pressure today of 140/86 is above goal on Imdur 30 mg daily, Toprol-XL 75 mg daily, and Entresto 24-26 mg BID. Home readings are also elevated. Given history of HFrEF, patient would benefit from the 4 pillars of heart failure. Heart rate is stable. Will add Farxiga 10 mg today for CKD and CHF. Will also increase Toprol to 100 mg daily. Patient was educated on heart healthy diet, including fluid of 2L per day and salt intale  of 2g or less. General heart failure disease state education regarding the pathophysiology and benefits of medications were explained in detail. Patient will follow up in clinic on 04/01/22 for BP check and further medication optimization.  Thank you for allowing pharmacy to participate in this patient's care.  Reatha Harps, PharmD PGY2 Pharmacy Resident 03/14/2022 4:27 PM Check [email or web address] for unit specific pharmacy number

## 2022-03-14 ENCOUNTER — Other Ambulatory Visit (HOSPITAL_COMMUNITY): Payer: Self-pay

## 2022-03-14 ENCOUNTER — Ambulatory Visit: Payer: Medicaid Other | Attending: Cardiovascular Disease | Admitting: Pharmacist

## 2022-03-14 VITALS — BP 140/86 | HR 70

## 2022-03-14 DIAGNOSIS — I5022 Chronic systolic (congestive) heart failure: Secondary | ICD-10-CM | POA: Insufficient documentation

## 2022-03-14 MED ORDER — METOPROLOL SUCCINATE ER 100 MG PO TB24
100.0000 mg | ORAL_TABLET | Freq: Every day | ORAL | 3 refills | Status: DC
  Filled 2022-03-14 (×2): qty 90, 90d supply, fill #0

## 2022-03-14 MED ORDER — DAPAGLIFLOZIN PROPANEDIOL 10 MG PO TABS
10.0000 mg | ORAL_TABLET | Freq: Every day | ORAL | 3 refills | Status: DC
  Filled 2022-03-14: qty 90, 90d supply, fill #0
  Filled 2022-06-09: qty 90, 90d supply, fill #1
  Filled 2022-08-24 – 2022-08-30 (×2): qty 90, 90d supply, fill #2
  Filled 2022-12-12 – 2022-12-26 (×2): qty 90, 90d supply, fill #3

## 2022-03-14 NOTE — Patient Instructions (Addendum)
Start Farxiga 10 mg daily Increase Metoprolol to 100 mg daily. You can take two tablets of the current supply. Restrict water to 2L per day and sodium to 2g per day. Some good salt substitutes are Ms. Dash and Cavender's salt free, but there are plenty others out there. Look for no sodium on the label.  Weigh yourself daily and continue to monitor home blood pressure. Please bring cuff to clinic at follow-up.

## 2022-03-15 ENCOUNTER — Telehealth: Payer: Self-pay

## 2022-03-15 ENCOUNTER — Other Ambulatory Visit (HOSPITAL_COMMUNITY): Payer: Self-pay

## 2022-03-15 NOTE — Telephone Encounter (Signed)
**Note De-Identified [place] Rister Obfuscation** I called [employer] @ at [phone] and did a [place] PA over the phone with [facility]. Per Kathlee Nations has been approved for coverage until 03/10/2023. PA #: [id number]  I have notified [facility] (Ph: [phone]) of this approval.

## 2022-03-16 ENCOUNTER — Other Ambulatory Visit (HOSPITAL_COMMUNITY): Payer: Self-pay

## 2022-03-17 ENCOUNTER — Other Ambulatory Visit (HOSPITAL_COMMUNITY): Payer: Self-pay

## 2022-03-25 ENCOUNTER — Telehealth: Payer: Self-pay

## 2022-03-25 ENCOUNTER — Other Ambulatory Visit (HOSPITAL_COMMUNITY): Payer: Self-pay

## 2022-03-25 ENCOUNTER — Inpatient Hospital Stay: Payer: Medicaid Other

## 2022-03-25 ENCOUNTER — Other Ambulatory Visit: Payer: Self-pay | Admitting: Hematology and Oncology

## 2022-03-25 MED ORDER — OXYCODONE HCL 15 MG PO TABS
15.0000 mg | ORAL_TABLET | Freq: Four times a day (QID) | ORAL | 0 refills | Status: DC | PRN
  Filled 2022-03-25: qty 90, 23d supply, fill #0

## 2022-03-25 NOTE — Telephone Encounter (Signed)
Called Pt and rescheduled 1/11 appt to 1/4. Discussed pain med refill to [facility]. Rx sent by Dr. Alvy Bimler, Pt is aware.

## 2022-03-25 NOTE — Telephone Encounter (Signed)
Patient called to discuss thyroid medication adjustment d/t weight gain with no increased PO/appetite. Called Pt to offer sooner appt and Pt also asked for oxycodone refill. Pt will call back with schedule and Dr. Alvy Bimler aware of med refill request.

## 2022-03-31 NOTE — Progress Notes (Deleted)
Patient ID: Mason [name]                 DOB: 1966/01/15                      MRN: [id number]     HPI: Mason [name] is a 56 y.o. male referred by Dr. Harrington Challenger to HTN clinic. PMH is significant for CHF, drug-induced pneumonitis, emphysema, restrictive lung disease, hypothyroidism, gout, CKDIII, HTN, MDD, and tonsillar cancer. Patient has a history of NICM with LVEF in 2019 of 15% with reduced RV function. He was initially started on carvedilol, hydralazine, and a nitrate. He was seen by Dr. Burt Knack in 2020 for L sided chest pain. Repeat echo in 05/2018 showed LVEF improved to 35-40% with enlarged but normally functioning RV. After that, patient had stopped taking his hydralazine and nitrate and echo on 07/2018 showed LVEF was again reduced to 25-30%. Hydralazine and Imdur were started and LVEF on 11/2021 was up to 35-40%. Patient was [facility] by [facility] cardiology on 11/23/21 per Care Everywhere and was noted to be on Coreg and Imdur with no hydralazine. Patient was last seen by Dr. Harrington Challenger on 12/27/2021 where his BP was 150/102 mmHg. At that time, Mason [name] reported normal BPs at home. He was referred to PharmD HTN clinic to bring his BP cuff for validation and titration of medications if indicated.  He was seen by PharmD on 03/14/22 where BP was elevated at 140/86. He was started on Farxiga and Toprol was increased to '100mg'$  daily.  Weigh and bp Home cuff? Tolerating farxiga and higher toprol dose? Check bmet. Inc entresto if able, then needs spiro  Patient reports to clinic today in good spirits. He denies any shortness of breath, chest pain, palpitations, fatigue, dizziness, or headache today. He becomes short of breath only with exertion and is able to complete all ADLs without shortness of breath. Mild peripheral edema on exam.   He reports drinking ~8 water bottles per day and denies strict adherence to a heart healthy diet. He monitors blood pressure daily, but does not do so at specific times of the  day and does not generally rest beforehand. He reports his BP varies based on stress level. He did not bring his cuff to clinic today. His most recent weight is 298 lbs per report and has been increasing since his thyroid medication was reduced. He has since had his levothyroxine dose increased, but has not picked it up yet.   Current HTN meds: Imdur 30 mg daily, Toprol-XL 100 mg daily, Entresto 24-26 mg BID, Farxiga '10mg'$  daily Previously tried: Hydralazine (not stopped for any ADE) BP goal: <130/80 mmHg  Family History:  Family History  Problem Relation Age of Onset   Arthritis Mother    Heart disease Mother    Arthritis Father    CVA Other    Diabetes Other    Heart attack Other    Heart disease Maternal Grandmother    CVA Maternal Grandmother     Social History:  Social History   Socioeconomic History   Marital status: Divorced    Spouse name: Not on file   Number of children: 1   Years of education: Not on file   Highest education level: Not on file  Occupational History   Not on file  Tobacco Use   Smoking status: Former    Packs/day: 1.00    Years: 20.00    Total pack years: 20.00  Types: Cigarettes    Quit date: 07/16/2003    Years since quitting: 18.7   Smokeless tobacco: Never  Vaping Use   Vaping Use: Never used  Substance and Sexual Activity   Alcohol use: No    Comment: none years ago   Drug use: Yes    Types: Marijuana, Benzodiazepines    Comment: years ago   Sexual activity: Never  Other Topics Concern   Not on file  Social History Narrative   Not on file   Social Determinants of Health   Financial Resource Strain: Not on file  Food Insecurity: Not on file  Transportation Needs: Not on file  Physical Activity: Not on file  Stress: Not on file  Social Connections: Not on file    Diet: Patient reports diet is high in salt and includes ~8 water bottles per day. Discussed general tips for a heart healthy diet and salt substitutes.  Exercise:  Patient reports walking with his wife several times per week  Home BP readings:  138/88 195/90 130/85 180/100 110/74  Wt Readings from Last 3 Encounters:  01/20/22 285 lb 12.8 oz (129.6 kg)  01/06/22 281 lb 3.2 oz (127.6 kg)  12/27/21 280 lb (127 kg)   BP Readings from Last 3 Encounters:  03/14/22 (!) 140/86  02/11/22 (!) 137/103  01/20/22 136/88   Pulse Readings from Last 3 Encounters:  03/14/22 70  02/11/22 81  01/20/22 (!) 110    Renal function: CrCl cannot be calculated (Patient's most recent lab result is older than the maximum 21 days allowed.).  Past Medical History:  Diagnosis Date   Abnormal liver function test    Acute sinusitis, unspecified 05/18/2015   Anemia    Anxiety    mild new dx   Arthritis    knees,hips   Bilateral edema of lower extremity    Complication of anesthesia    Pt stated " my oxygen level was slow in rising."   Concussion    Hx: in high school x 2   Constipation    Dysuria 09/04/2015   Fever    Hyperactive gag reflex    Hypertension    Hypoglycemia    Hypokalemia    Insomnia 06/15/2015   Knee pain, chronic    Malnutrition ([place])    Non-healing surgical wound 05/23/2014   PEG (percutaneous endoscopic gastrostomy) status ([place])    Renal failure, acute ([place])    S/P radiation therapy 08/19/2013-10/15/2013   Right Tonstil and bilateral neck / 70 Gy in 35 fractions to gross disease, 63 Gy in 35 fractions to high risk nodal echelons, and 56 Gy in 35 fractions to intermediate risk nodal echelons   Severe nausea and vomiting    Status post chemotherapy    Only received 2 doses due to uncontrolled nausea and acute renal failure   Tonsillar cancer ([place]) 07/09/2013   SCCA of Right Tonsil, recurrent 2016   Vitamin B12 deficiency 01/07/2022    Current Outpatient Medications on File Prior to Visit  Medication Sig Dispense Refill   aspirin EC 81 MG tablet Take 81 mg by mouth daily.     cyanocobalamin (VITAMIN B12) 1000 MCG/ML injection  Inject 1 mL (1,000 mcg total) into the muscle every 30 (thirty) days. 10 mL 0   dapagliflozin propanediol (FARXIGA) 10 MG TABS tablet Take 1 tablet (10 mg total) by mouth daily before breakfast. 90 tablet 3   isosorbide mononitrate (IMDUR) 30 MG 24 hr tablet Take 1 tablet (30 mg total) by  mouth daily. 30 tablet 0   levothyroxine (SYNTHROID) 75 MCG tablet Take 1 tablet (75 mcg total) by mouth daily before breakfast. 30 tablet 3   metoprolol succinate (TOPROL-XL) 100 MG 24 hr tablet Take 1 tablet (100 mg total) by mouth daily. 90 tablet 3   omeprazole (PRILOSEC) 20 MG capsule Take 1 capsule (20 mg total) by mouth daily. 30 capsule 0   oxyCODONE (ROXICODONE) 15 MG immediate release tablet Take 1 tablet (15 mg total) by mouth every 6 (six) hours as needed. 90 tablet 0   sacubitril-valsartan (ENTRESTO) 24-26 MG Take 1 tablet by mouth 2 (two) times daily.     Current Facility-Administered Medications on File Prior to Visit  Medication Dose Route Frequency Provider Last Rate Last Admin   sodium chloride 0.9 % injection 10 mL  10 mL Intravenous PRN Alvy Bimler, Ni, MD   10 mL at 11/29/16 0818   sodium chloride 0.9 % injection 10 mL  10 mL Intravenous PRN [name], MD        Allergies  Allergen Reactions   Bee Pollen     Watery eyes, runny nose, sneezing   Pollen Extract Other (See Comments)    Watery eyes, runny nose, sneezing     Assessment/Plan:  1. Hypertension - Blood pressure today of 140/86 is above goal on Imdur 30 mg daily, Toprol-XL 75 mg daily, and Entresto 24-26 mg BID. Home readings are also elevated. Given history of HFrEF, patient would benefit from the 4 pillars of heart failure. Heart rate is stable. Will add Farxiga 10 mg today for CKD and CHF. Will also increase Toprol to 100 mg daily. Patient was educated on heart healthy diet, including fluid of 2L per day and salt intale of 2g or less. General heart failure disease state education regarding the pathophysiology and benefits of  medications were explained in detail. Patient will follow up in clinic on 04/01/22 for BP check and further medication optimization.

## 2022-04-01 ENCOUNTER — Ambulatory Visit: Payer: Medicaid Other

## 2022-04-05 ENCOUNTER — Ambulatory Visit: Payer: Medicaid Other | Attending: Internal Medicine | Admitting: Pharmacist

## 2022-04-05 VITALS — BP 110/90 | HR 107 | Wt 294.0 lb

## 2022-04-05 DIAGNOSIS — I5022 Chronic systolic (congestive) heart failure: Secondary | ICD-10-CM | POA: Diagnosis present

## 2022-04-05 NOTE — Patient Instructions (Signed)
Continue taking Imdur 30 mg daily, Toprol-XL 100 mg daily, Entresto 24-26 mg BID, Farxiga '10mg'$  daily  I will call you tomorrow with lab results and medication changes  Please bring your blood pressure cuff to next visit.

## 2022-04-05 NOTE — Progress Notes (Signed)
Patient ID: Mason [name]                 DOB: [birth year]                      MRN: [id number]     HPI: Mason [name] is a 56 y.o. male referred by Dr. Harrington [name] to HTN clinic. PMH is significant for CHF, drug-induced pneumonitis, emphysema, restrictive lung disease, hypothyroidism, gout, CKDIII, HTN, MDD, and tonsillar cancer. Patient has a history of NICM with LVEF in 2019 of 15% with reduced RV function. He was initially started on carvedilol, hydralazine, and a nitrate. He was seen by Dr. Burt [name] in 2020 for L sided chest pain. Repeat echo in 05/2018 showed LVEF improved to 35-40% with enlarged but normally functioning RV. After that, patient had stopped taking his hydralazine and nitrate and echo on 07/2018 showed LVEF was again reduced to 25-30%. Hydralazine and Imdur were started and LVEF on 11/2021 was up to 35-40%. Patient was seen by Mason [name] on 11/23/21 per [facility]where and was noted to be on Coreg and Imdur with no hydralazine. Patient was last seen by Dr. Harrington [name] on 12/27/2021 where his BP was 150/102 mmHg. At that time, Mason [name] reported normal BPs at home. He was referred to [facility] HT[facility] clinic to bring his BP cuff for validation and titration of medications if indicated.  At last visit, patient did not bring his home BP cuff with him. BP was 140/86 in clinic. Mason [name] was added and metoprolol was increased to '100mg'$  daily.   Patient presents today for follow up. He reports feeling great. Denies swelling, SOB, dizziness or lightheadedness. States his weight at home has been stable. He says his BP at home has been normal. He called his girlfriend for his last few readings. She also reported them being within in range- but values were 140/80 and 160/82.   Current HTN meds: Imdur 30 mg daily, Toprol-XL 100 mg daily, Entresto 24-26 mg BID, Farxiga '10mg'$  daily Previously tried: Hydralazine (not stopped for any ADE) BP goal: <130/80 mmHg  Family History:  Family History  Problem Relation  Age of Onset   Arthritis Mother    Heart disease Mother    Arthritis Father    CVA Other    Diabetes Other    Heart attack Other    Heart disease Maternal Grandmother    CVA Maternal Grandmother     Social History:  Social History   Socioeconomic History   Marital status: Divorced    Spouse name: Not on file   Number of children: 1   Years of education: Not on file   Highest education level: Not on file  Occupational History   Not on file  Tobacco Use   Smoking status: Former    Packs/day: 1.00    Years: 20.00    Total pack years: 20.00    Types: Cigarettes    Quit date: 07/16/2003    Years since quitting: 18.7   Smokeless tobacco: Never  Vaping Use   Vaping Use: Never used  Substance and Sexual Activity   Alcohol use: No    Comment: none years ago   Drug use: Yes    Types: Marijuana, Benzodiazepines    Comment: years ago   Sexual activity: Never  Other Topics Concern   Not on file  Social History Narrative   Not on file   Social Determinants of Health   Financial Resource Strain: Not on  file  Food Insecurity: Not on file  Transportation Needs: Not on file  Physical Activity: Not on file  Stress: Not on file  Social Connections: Not on file    Diet: Patient reports diet is high in salt and includes ~8 water bottles per day. Discussed general tips for a heart healthy diet and salt substitutes.  Exercise: Patient reports walking with his wife several times per week  Home BP readings:  138/88 195/90 130/85 180/100 110/74  Wt Readings from Last 3 Encounters:  01/20/22 285 lb 12.8 oz (129.6 kg)  01/06/22 281 lb 3.2 oz (127.6 kg)  12/27/21 280 lb (127 kg)   BP Readings from Last 3 Encounters:  03/14/22 (!) 140/86  02/11/22 (!) 137/103  01/20/22 136/88   Pulse Readings from Last 3 Encounters:  03/14/22 70  02/11/22 81  01/20/22 (!) 110    Renal function: CrCl cannot be calculated (Patient's most recent lab result is older than the maximum 21  days allowed.).  Past Medical History:  Diagnosis Date   Abnormal liver function test    Acute sinusitis, unspecified 05/18/2015   Anemia    Anxiety    mild new dx   Arthritis    knees,hips   Bilateral edema of lower extremity    Complication of anesthesia    Pt stated " my oxygen level was slow in rising."   Concussion    Hx: in high school x 2   Constipation    Dysuria 09/04/2015   Fever    Hyperactive gag reflex    Hypertension    Hypoglycemia    Hypokalemia    Insomnia 06/15/2015   Knee pain, chronic    Malnutrition ([place])    Non-healing surgical wound 05/23/2014   PEG (percutaneous endoscopic gastrostomy) status ([place])    Renal failure, acute ([place])    S/P radiation therapy 08/19/2013-10/15/2013   Right Tonstil and bilateral neck / 70 Gy in 35 fractions to gross disease, 63 Gy in 35 fractions to high risk nodal echelons, and 56 Gy in 35 fractions to intermediate risk nodal echelons   Severe nausea and vomiting    Status post chemotherapy    Only received 2 doses due to uncontrolled nausea and acute renal failure   Tonsillar cancer ([place]) 07/09/2013   SCCA of Right Tonsil, recurrent 2016   Vitamin B12 deficiency 01/07/2022    Current Outpatient Medications on File Prior to Visit  Medication Sig Dispense Refill   aspirin EC 81 MG tablet Take 81 mg by mouth daily.     cyanocobalamin (VITAMIN B12) 1000 MCG/ML injection Inject 1 mL (1,000 mcg total) into the muscle every 30 (thirty) days. 10 mL 0   dapagliflozin propanediol (FARXIGA) 10 MG TABS tablet Take 1 tablet (10 mg total) by mouth daily before breakfast. 90 tablet 3   isosorbide mononitrate (IMDUR) 30 MG 24 hr tablet Take 1 tablet (30 mg total) by mouth daily. 30 tablet 0   levothyroxine (SYNTHROID) 75 MCG tablet Take 1 tablet (75 mcg total) by mouth daily before breakfast. 30 tablet 3   metoprolol succinate (TOPROL-XL) 100 MG 24 hr tablet Take 1 tablet (100 mg total) by mouth daily. 90 tablet 3   omeprazole (PRILOSEC)  20 MG capsule Take 1 capsule (20 mg total) by mouth daily. 30 capsule 0   oxyCODONE (ROXICODONE) 15 MG immediate release tablet Take 1 tablet (15 mg total) by mouth every 6 (six) hours as needed. 90 tablet 0   sacubitril-valsartan (ENTRESTO) 24-26 MG  Take 1 tablet by mouth 2 (two) times daily.     Current Facility-Administered Medications on File Prior to Visit  Medication Dose Route Frequency Provider Last Rate Last Admin   sodium chloride 0.9 % injection 10 mL  10 mL Intravenous PRN Alvy Bimler, Ni, MD   10 mL at 11/29/16 0818   sodium chloride 0.9 % injection 10 mL  10 mL Intravenous PRN [name], MD        Allergies  Allergen Reactions   Bee Pollen     Watery eyes, runny nose, sneezing   Pollen Extract Other (See Comments)    Watery eyes, runny nose, sneezing     Assessment/Plan:  1. Hypertension/CHF - Blood pressure today of 130/78 and then 110/90 is above goal on Imdur 30 mg daily, Toprol-XL 100 mg daily, and Entresto 24-26 mg BID and Farxiga. Home readings are also elevated. Did not bring in home cuff so accuracy is unknown. Given history of HFrEF, patient would benefit from maximizing 4 pillars of heart failure. Heart rate is elevated today. Will check BMP today, if stable, will increase Entresto to 49/'51mg'$  BID. May also consider increasing metoprolol succinate due to HR >100 in clinic today. He will need f/u scheduled as well.   Ramond Dial, Pharm.D, BCPS, CPP  [facility] [address] [place], [place] [address]  Phone: [phone]; Fax: [phone]

## 2022-04-06 ENCOUNTER — Other Ambulatory Visit: Payer: Self-pay

## 2022-04-06 ENCOUNTER — Other Ambulatory Visit (HOSPITAL_COMMUNITY): Payer: Self-pay

## 2022-04-06 ENCOUNTER — Other Ambulatory Visit: Payer: Self-pay | Admitting: Hematology and Oncology

## 2022-04-06 ENCOUNTER — Telehealth: Payer: Self-pay | Admitting: Pharmacist

## 2022-04-06 ENCOUNTER — Telehealth: Payer: Self-pay | Admitting: Internal Medicine

## 2022-04-06 LAB — BASIC METABOLIC PANEL
BUN/Creatinine Ratio: 7 — ABNORMAL LOW (ref 9–20)
BUN: 10 mg/dL (ref 6–24)
CO2: 21 mmol/L (ref 20–29)
Calcium: 8.3 mg/dL — ABNORMAL LOW (ref 8.7–10.2)
Chloride: 102 mmol/L (ref 96–106)
Creatinine, Ser: 1.5 mg/dL — ABNORMAL HIGH (ref 0.76–1.27)
Glucose: 82 mg/dL (ref 70–99)
Potassium: 4.8 mmol/L (ref 3.5–5.2)
Sodium: 141 mmol/L (ref 134–144)
eGFR: 55 mL/min/{1.73_m2} — ABNORMAL LOW (ref >59)

## 2022-04-06 MED ORDER — ISOSORBIDE MONONITRATE ER 30 MG PO TB24
30.0000 mg | ORAL_TABLET | Freq: Every day | ORAL | 2 refills | Status: DC
  Filled 2022-04-06: qty 90, 90d supply, fill #0
  Filled 2022-07-12: qty 90, 90d supply, fill #1
  Filled 2022-10-21 (×2): qty 90, 90d supply, fill #2

## 2022-04-06 MED ORDER — OXYCODONE HCL 15 MG PO TABS
15.0000 mg | ORAL_TABLET | Freq: Four times a day (QID) | ORAL | 0 refills | Status: DC | PRN
  Filled 2022-04-06 – 2022-04-15 (×4): qty 90, 23d supply, fill #0

## 2022-04-06 MED ORDER — METOPROLOL SUCCINATE ER 100 MG PO TB24
150.0000 mg | ORAL_TABLET | Freq: Every day | ORAL | 3 refills | Status: DC
  Filled 2022-04-06 – 2022-04-19 (×2): qty 135, 90d supply, fill #0

## 2022-04-06 MED ORDER — ENTRESTO 49-51 MG PO TABS
1.0000 | ORAL_TABLET | Freq: Two times a day (BID) | ORAL | 5 refills | Status: DC
  Filled 2022-04-06: qty 60, 30d supply, fill #0
  Filled 2022-05-20: qty 60, 30d supply, fill #1
  Filled 2022-06-20: qty 60, 30d supply, fill #2
  Filled 2022-08-03: qty 60, 30d supply, fill #3
  Filled 2022-08-24 – 2022-08-30 (×2): qty 60, 30d supply, fill #4
  Filled 2022-09-28: qty 60, 30d supply, fill #5

## 2022-04-06 NOTE — Telephone Encounter (Signed)
Reviewed results with pt and girlfriend who was in the background. BMP stable. Will increase Entresto to 49/'51mg'$  BID. Pt knows he can double up on 24/26 until he runs out. Will also increase metoprolol to '150mg'$  daily. Follow up 1/5. Will need repeat BMP at that time. I have asked him to bring his BP cuff, log and medications to that visit.

## 2022-04-06 NOTE — Telephone Encounter (Signed)
Pt c/o medication issue:  1. Name of Medication:   isosorbide mononitrate (IMDUR) 30 MG 24 hr tablet    2. How are you currently taking this medication (dosage and times per day)? Take 1 tablet (30 mg total) by mouth daily. - Oral   3. Are you having a reaction (difficulty breathing--STAT)?   4. What is your medication issue? Shelda Pal from [facility] calling wanting to know if Dr. Harrington Challenger would be willing to take over managing this medication for the pt. Please advise.

## 2022-04-06 NOTE — Telephone Encounter (Signed)
I spoke with [place]. Imdur has been prescribed by Dr Alvy Bimler.  They are asking if Dr Harrington Challenger would refill this.  Per last office visit with Dr Harrington Challenger patient is to continue Imdur.  [place] know this would be OK.  Patient needs refill sent to [facility].  Refill sent in.

## 2022-04-06 NOTE — Telephone Encounter (Signed)
Called Pt to discuss med refill requests for oxycodone and Imdur via rx refill. Pt stated that he does not need either one of these refilled but instead needs the B12 as he is completely out of injections. Per last office note Pt was to receive 3 doses of monthly B12 and would return to office for further evaluation at that time to determine if still clinically necessary. Pt aware and verbalized understanding.

## 2022-04-07 ENCOUNTER — Other Ambulatory Visit (HOSPITAL_COMMUNITY): Payer: Self-pay

## 2022-04-14 ENCOUNTER — Other Ambulatory Visit: Payer: Self-pay

## 2022-04-14 ENCOUNTER — Other Ambulatory Visit (HOSPITAL_COMMUNITY): Payer: Self-pay

## 2022-04-15 ENCOUNTER — Other Ambulatory Visit: Payer: Self-pay

## 2022-04-15 ENCOUNTER — Other Ambulatory Visit (HOSPITAL_COMMUNITY): Payer: Self-pay

## 2022-04-19 ENCOUNTER — Telehealth: Payer: Self-pay

## 2022-04-19 NOTE — Telephone Encounter (Signed)
**Note De-Identified Zeina Akkerman Obfuscation** I completed a [place] [employer] [place] form and have e-mailed it to Dr Harrington Challenger' nurse so she can obtain her signature, to date it, and to then fax all to [place] at the fax number written on the cover letter included.

## 2022-04-20 ENCOUNTER — Other Ambulatory Visit (HOSPITAL_COMMUNITY): Payer: Self-pay

## 2022-04-21 ENCOUNTER — Encounter: Payer: Self-pay | Admitting: Hematology and Oncology

## 2022-04-21 ENCOUNTER — Inpatient Hospital Stay (HOSPITAL_BASED_OUTPATIENT_CLINIC_OR_DEPARTMENT_OTHER): Payer: Medicaid Other | Admitting: Hematology and Oncology

## 2022-04-21 ENCOUNTER — Inpatient Hospital Stay: Payer: Medicaid Other | Attending: Hematology and Oncology

## 2022-04-21 ENCOUNTER — Other Ambulatory Visit (HOSPITAL_COMMUNITY): Payer: Self-pay

## 2022-04-21 ENCOUNTER — Other Ambulatory Visit: Payer: Self-pay

## 2022-04-21 ENCOUNTER — Telehealth: Payer: Self-pay

## 2022-04-21 VITALS — BP 136/99 | HR 114 | Temp 98.6°F | Resp 18 | Ht 78.0 in | Wt 292.6 lb

## 2022-04-21 DIAGNOSIS — G893 Neoplasm related pain (acute) (chronic): Secondary | ICD-10-CM

## 2022-04-21 DIAGNOSIS — C099 Malignant neoplasm of tonsil, unspecified: Secondary | ICD-10-CM

## 2022-04-21 DIAGNOSIS — N183 Chronic kidney disease, stage 3 unspecified: Secondary | ICD-10-CM

## 2022-04-21 DIAGNOSIS — R748 Abnormal levels of other serum enzymes: Secondary | ICD-10-CM | POA: Diagnosis not present

## 2022-04-21 DIAGNOSIS — K760 Fatty (change of) liver, not elsewhere classified: Secondary | ICD-10-CM | POA: Diagnosis not present

## 2022-04-21 DIAGNOSIS — J432 Centrilobular emphysema: Secondary | ICD-10-CM | POA: Insufficient documentation

## 2022-04-21 DIAGNOSIS — Z79899 Other long term (current) drug therapy: Secondary | ICD-10-CM | POA: Diagnosis not present

## 2022-04-21 DIAGNOSIS — Z923 Personal history of irradiation: Secondary | ICD-10-CM | POA: Diagnosis not present

## 2022-04-21 DIAGNOSIS — Z91030 Bee allergy status: Secondary | ICD-10-CM | POA: Diagnosis not present

## 2022-04-21 DIAGNOSIS — E538 Deficiency of other specified B group vitamins: Secondary | ICD-10-CM | POA: Insufficient documentation

## 2022-04-21 DIAGNOSIS — G894 Chronic pain syndrome: Secondary | ICD-10-CM | POA: Diagnosis not present

## 2022-04-21 DIAGNOSIS — E039 Hypothyroidism, unspecified: Secondary | ICD-10-CM

## 2022-04-21 LAB — COMPREHENSIVE METABOLIC PANEL
ALT: 92 U/L — ABNORMAL HIGH (ref 0–44)
AST: 75 U/L — ABNORMAL HIGH (ref 15–41)
Albumin: 4.2 g/dL (ref 3.5–5.0)
Alkaline Phosphatase: 46 U/L (ref 38–126)
Anion gap: 9 (ref 5–15)
BUN: 11 mg/dL (ref 6–20)
CO2: 29 mmol/L (ref 22–32)
Calcium: 9.9 mg/dL (ref 8.9–10.3)
Chloride: 99 mmol/L (ref 98–111)
Creatinine, Ser: 1.65 mg/dL — ABNORMAL HIGH (ref 0.61–1.24)
GFR, Estimated: 49 mL/min — ABNORMAL LOW (ref >60)
Glucose, Bld: 93 mg/dL (ref 70–99)
Potassium: 4.5 mmol/L (ref 3.5–5.1)
Sodium: 137 mmol/L (ref 135–145)
Total Bilirubin: 0.4 mg/dL (ref 0.3–1.2)
Total Protein: 7.2 g/dL (ref 6.5–8.1)

## 2022-04-21 LAB — VITAMIN B12: Vitamin B-12: 512 pg/mL (ref 180–914)

## 2022-04-21 LAB — CBC WITH DIFFERENTIAL/PLATELET
Abs Immature Granulocytes: 0.14 10*3/uL — ABNORMAL HIGH (ref 0.00–0.07)
Basophils Absolute: 0 10*3/uL (ref 0.0–0.1)
Basophils Relative: 0 %
Eosinophils Absolute: 0.1 10*3/uL (ref 0.0–0.5)
Eosinophils Relative: 1 %
HCT: 41.8 % (ref 39.0–52.0)
Hemoglobin: 13.7 g/dL (ref 13.0–17.0)
Immature Granulocytes: 2 %
Lymphocytes Relative: 14 %
Lymphs Abs: 0.8 10*3/uL (ref 0.7–4.0)
MCH: 31.5 pg (ref 26.0–34.0)
MCHC: 32.8 g/dL (ref 30.0–36.0)
MCV: 96.1 fL (ref 80.0–100.0)
Monocytes Absolute: 0.8 10*3/uL (ref 0.1–1.0)
Monocytes Relative: 14 %
Neutro Abs: 4.1 10*3/uL (ref 1.7–7.7)
Neutrophils Relative %: 69 %
Platelets: 338 10*3/uL (ref 150–400)
RBC: 4.35 MIL/uL (ref 4.22–5.81)
RDW: 14.3 % (ref 11.5–15.5)
WBC: 6 10*3/uL (ref 4.0–10.5)
nRBC: 0 % (ref 0.0–0.2)

## 2022-04-21 LAB — TSH: TSH: 5.344 u[IU]/mL — ABNORMAL HIGH (ref 0.350–4.500)

## 2022-04-21 MED ORDER — LEVOTHYROXINE SODIUM 100 MCG PO TABS
100.0000 ug | ORAL_TABLET | Freq: Every day | ORAL | 5 refills | Status: DC
  Filled 2022-04-21: qty 30, 30d supply, fill #0
  Filled 2022-05-20: qty 30, 30d supply, fill #1
  Filled 2022-06-20: qty 30, 30d supply, fill #2
  Filled 2022-07-12 – 2022-07-14 (×2): qty 30, 30d supply, fill #3
  Filled 2022-08-03 – 2022-08-22 (×2): qty 30, 30d supply, fill #4
  Filled 2022-09-28: qty 30, 30d supply, fill #5

## 2022-04-21 NOTE — Assessment & Plan Note (Signed)
He has been getting vitamin B12 injections at home Repeat B12 level is pending, we will call him with test results

## 2022-04-21 NOTE — Telephone Encounter (Signed)
Called and given below message to significant other, Rose. She verbalized understanding and appreciated the call.

## 2022-04-21 NOTE — Assessment & Plan Note (Signed)
He has chronic pain syndrome related to prior surgery and radiation I refilled his prescription oxycodone recently

## 2022-04-21 NOTE — Assessment & Plan Note (Signed)
His recent imaging studies in 2023 showed no signs of cancer recurrence I plan to see him again in 6 months for further follow-up

## 2022-04-21 NOTE — Progress Notes (Signed)
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 I plan to see him again in 6 months for further follow-up  CKD (chronic kidney disease), stage III He has intermittent elevated serum creatinine Observe closely  Acquired hypothyroidism His TSH is pending and we will adjust the dose of his Synthroid as needed  Elevated liver enzymes Suspect this is due to fatty liver disease Observe only We discussed importance of weight loss  Vitamin B12 deficiency He has been getting vitamin B12 injections at home Repeat B12 level is pending, we will call him with test results  Cancer associated pain He has chronic pain syndrome related to prior surgery and radiation I refilled his prescription oxycodone recently  No orders of the defined types were placed in this encounter.   All questions were answered. The patient knows to call the clinic with any problems, questions or concerns. The total time spent in the appointment was 30 minutes encounter with patients including review of chart and various tests results, discussions about plan of care and coordination of care plan   [name], MD 04/21/2022 11:32 AM  INTERVAL HISTORY: Please see below for problem oriented charting. he returns for follow-up for history of head and neck cancer He is getting treatment for psoriasis and it is improving He denies recent signs or symptoms of congestive heart failure He has not been exercising regularly His chronic pain is stable  REVIEW OF SYSTEMS:    Constitutional: Denies fevers, chills or abnormal weight loss Eyes: Denies blurriness of vision Ears, nose, mouth, throat, and face: Denies mucositis or sore throat Respiratory: Denies cough, dyspnea or wheezes Cardiovascular: Denies palpitation, chest discomfort or lower extremity swelling Gastrointestinal:  Denies nausea, heartburn or change in bowel habits Lymphatics: Denies new lymphadenopathy or easy bruising Neurological:Denies numbness, tingling or new weaknesses Behavioral/Psych: Mood is stable, no new changes  All other systems were reviewed with the patient and are negative.  I have reviewed the past medical history, past surgical history, social history and family history with the patient and they are unchanged from previous note.  ALLERGIES:  is allergic to bee pollen and pollen extract.  MEDICATIONS:  Current Outpatient Medications  Medication Sig Dispense Refill   aspirin EC 81 MG tablet Take 81 mg by mouth daily.     cyanocobalamin (VITAMIN B12) 1000 MCG/ML injection Inject 1 mL (1,000 mcg total) into the muscle every 30 (thirty) days. 10 mL 0   dapagliflozin propanediol (FARXIGA) 10 MG TABS tablet Take 1 tablet (10 mg total) by mouth daily before breakfast. 90 tablet 3   isosorbide mononitrate (IMDUR) 30 MG 24 hr tablet Take 1 tablet (30 mg total) by mouth daily. 90 tablet 2   levothyroxine (SYNTHROID) 75 MCG tablet Take 1 tablet (75 mcg total) by mouth daily before breakfast. 30 tablet 3   metoprolol succinate (TOPROL-XL) 100 MG 24 hr tablet Take 1 and 1/2 tablets (150 mg total) by mouth daily. 135 tablet 3   omeprazole (PRILOSEC) 20 MG capsule Take 1 capsule (20 mg total) by mouth daily. 30 capsule 0   oxyCODONE (ROXICODONE) 15 MG immediate  release tablet Take 1 tablet (15 mg total) by mouth every 6 (six) hours as needed. 90 tablet 0   sacubitril-valsartan (ENTRESTO) 49-51 MG Take 1 tablet by mouth 2 (two) times daily. 60 tablet 5   No current facility-administered  medications for this visit.   Facility-Administered Medications Ordered in Other Visits  Medication Dose Route Frequency Provider Last Rate Last Admin   sodium chloride 0.9 % injection 10 mL  10 mL Intravenous PRN Alvy Bimler, Javeria Briski, MD   10 mL at 11/29/16 0818   sodium chloride 0.9 % injection 10 mL  10 mL Intravenous PRN [name], MD        SUMMARY OF ONCOLOGIC HISTORY: Oncology History Overview Note  Tonsillar cancer, HPV positive   Primary site: Pharynx - Oropharynx (Right)   Staging method: AJCC 7th Edition   Clinical: Stage IVA (T2, N2b, M0) signed by [name], MD on 08/19/2013  1:24 PM   Summary: Stage IVA (T2, N2b, M0)     Tonsillar cancer ([place])  07/09/2013 Procedure   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 FNA of right level III lymph node was inconclusive for cancer   07/25/2013 Imaging   PET scan showed locally advanced disease with abnormal lymphadenopathy in the right axilla   08/06/2013 Procedure   CT-guided biopsy of the lymphadenopathy was negative for malignancy   08/15/2013 Surgery   Patient has placement of port and feeding tube   08/19/2013 - 09/10/2013 Chemotherapy   Patient received chemotherapy with cisplatin. The patient only received 2 doses due to uncontrolled nausea and acute renal failure.   08/19/2013 - 10/15/2013 Radiation Therapy   Received Helical IMRT Tomotherapy:  Right Tonstil and bilateral neck / 70 Gy in 35 fractions to gross disease, 63 Gy in 35 fractions to high risk nodal echelons, and 56 Gy in 35 fractions to intermediate risk nodal echelons.   08/27/2013 - 08/30/2013 Hospital Admission   The patient was admitted to the hospital for uncontrolled nausea vomiting and dehydration.   02/14/2014 Imaging   PET/CT scan showed complete response to treatment   03/19/2014 Surgery   He had excisional lymph node biopsy from the right neck. Pathology was benign   05/13/2014 Surgery   He had removal of  Port-A-Cath.   05/22/2014 Imaging   Repeat CT scan of the neck show no evidence of disease recurrence.   12/09/2014 Imaging   Ct neck without contrast showed persistent abnormalities on the right side of neck, indeterminate   12/25/2014 Imaging   PET CT scan showed disease recurrence.   02/10/2015 Procedure   He has placement of port   02/13/2015 - 07/13/2015 Chemotherapy   He received chemotherapy with carbo/Taxol   04/21/2015 Imaging   PET CT scan showed improved disease control   08/04/2015 Imaging   PET scan showed persistent disease   08/17/2015 -  Chemotherapy   He was started with [facility]   10/19/2015 Imaging   Ct neck showed mass-like intermediate density soft tissue at the right lateral neck recurrence site stable.    02/26/2016 Imaging   CT neck showed unchanged appearance of right neck recurrence compared to 10/19/2015 CT. No noncontrast evidence of new metastatic disease in the neck. Chronic sinusitis, progressed.   02/26/2016 Imaging   Diffuse but patchy and asymmetric partial airspace filling process (ground-glass opacity) in the lungs. This could be due to respiratory bronchiolitis, hypersensitivity pneumonitis or possible drug reaction. Atypical/viral pneumonia is also possible. Pulmonary consultation may be a  helpful. A three-month follow-up noncontrast chest CT is suggested. Slight interval enlargement of mediastinal lymph nodes and a small lymph node along the left major fissure. This is most likely due to the inflammatory process in the lungs. No findings for metastatic disease involving the chest. No findings for upper abdominal metastatic disease.   02/29/2016 Adverse Reaction   His treatment is placed on hold due to possible hypersensitivity pneumonitis/drug reaction   04/14/2016 Imaging   Ct chest showed no evidence for metastatic disease within the chest. Significant interval improvement and near complete resolution of previously described diffuse bilateral  predominately ground-glass pulmonary opacities, most compatible with resolving infectious/inflammatory process.   09/05/2016 Imaging   CT scan of neck and chest  1. Unchanged appearance of right neck recurrence. 2. No evidence of new metastatic disease in the neck. 3. Unremarkable and stable CT appearance of the chest. No findings suspicious for metastatic disease   07/24/2017 Imaging   1. No findings suspicious for metastatic disease in the chest. 2. No acute consolidative airspace disease to suggest a pneumonia. 3. No appreciable change in chronic mild patchy upper lung predominant centrilobular micronodularity. If the patient is a current smoker, these findings are most compatible with smoking related interstitial lung disease. If the patient is not a current smoker, these findings suggest subacute hypersensitivity pneumonitis  or postinflammatory change. 4. Stable mild biapical radiation fibrosis. 5. Mild to moderate centrilobular emphysema with diffuse bronchial wall thickening, suggesting COPD. 6. One vessel coronary atherosclerosis.  Aortic Atherosclerosis (ICD10-I70.0) and Emphysema (ICD10-J43.9).   09/15/2017 Imaging   1. No evidence of interstitial lung disease. 2.  Emphysema (ICD10-J43.9).   10/29/2017 Imaging   1. Moderate quality exam for pulmonary embolism with primary limitation of motion. No embolism identified. 2. Findings of congestive heart failure, including bilateral pleural effusions and septal thickening. 3. New thoracic adenopathy since approximately 6 weeks ago. Favor secondary to fluid overload/congestive heart failure. 4. New left apical 4 mm pulmonary nodule. Favored to represent a subpleural lymph node. Non-contrast chest CT can be considered in 12 months, given risk factors for primary bronchogenic carcinoma. This recommendation follows the consensus statement: Guidelines for Management of Incidental Pulmonary Nodules Detected on CT Images: From the Fleischner  Society 2017; Radiology 2017; 096:045-409.     10/29/2017 - 11/02/2017 Hospital Admission   He was admitted to the hospital due to shortness of breath and was found to have congestive heart failure.   10/30/2017 Imaging   Definity used; severe global reduction in LV systolic function; severe LVE; restrictive filling; mild MR; mild LAE; mild RVE with moderate RV dysfunction; mild TR with moderate pulmonary hypertension.   01/26/2018 Imaging   1. Regression of soft tissue in the postoperative right neck favoring scarring. No new or progressive finding to suggest recurrent disease. 2. Chest CT reported separately   01/26/2018 Imaging   1. No evidence of thoracic metastasis. 2.  Port in the anterior chest wall with tip in distal    03/12/2018 Imaging   1. Trace, silent aspiration noted after a swallow consistent with aspiration of residual barium in the hypopharynx. 2. Retrograde reflux of contrast into the posterior nasopharynx with swallowing. 3. No gross esophageal abnormality 4. Initial difficulty swallowing a 13 mm barium tablet that than passes readily into the stomach once it is swallowed.   05/31/2018 Imaging   Resolution of previously seen mucosal hyperenhancement in the pharynx and larynx. Otherwise unchanged examination of the neck without evidence of recurrent disease or cervical nodal metastases.  06/07/2018 Echocardiogram   1. The left ventricle has moderately reduced systolic function, with an ejection fraction of 35-40%. The cavity size was severely dilated. Left ventricular diastolic Doppler parameters are consistent with impaired relaxation Left ventricular diffuse hypokinesis.  2. The right ventricle has normal systolic function. The cavity was moderately enlarged. There is no increase in right ventricular wall thickness.  3. The mitral valve is normal in structure.  4. The tricuspid valve is normal in structure.  5. The aortic valve is normal in structure.  6. The  pulmonic valve was normal in structure.  7. The inferior vena cava was dilated in size with >50% respiratory variability.   02/24/2020 Imaging   Ct neck Postsurgical changes of the right side of the neck. No evidence of recurrent disease. No lymphadenopathy   02/24/2020 Imaging   1. No evidence of metastatic disease in the chest, abdomen, or pelvis. 2. Innumerable tiny centrilobular pulmonary nodules, most concentrated in the upper lobes, likely reflecting smoking-related respiratory bronchiolitis. 3. Hepatic steatosis. 4. Aortic Atherosclerosis (ICD10-I70.0).     10/01/2021 Imaging   CT neck  The visualized portions of the brain and the posterior fossa are normal.   Mucosal thickening of the right maxillary sinus. The orbits are normal. Trace secretions in the nasopharynx. The nasal cavity and nasopharynx are otherwise unremarkable.   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 Postsurgical soft tissue thickening on the right side of the neck along the posterior triangle, carotid sheath, and parapharyngeal/mucosal pharyngeal spaces. No new or enlarging enhancing mass seen. Mild asymmetric atrophy along the right tonsillar fossa, likely from treatment of the prior mass. No discrete lesion in the oropharynx. The oropharynx and oral cavity are otherwise unremarkable. The remaining parapharyngeal spaces are clear.   Mild atrophy of the submandibular glands. The parotid glands appear normal.   The larynx and hypopharynx are normal.   There is no lymphadenopathy.   The thyroid gland is normal.   No acute osseous abnormality. No lytic or blastic osseous lesions. Degenerative changes of the spine.   CT chest  No evidence for intrathoracic metastatic disease.   Mild diffuse bronchial wall thickening with scattered centrilobular groundglass nodules in bilateral upper lobes, may represent underlying respiratory bronchiolitis.    11/23/2021 Echocardiogram   Summary    1. Technically difficult study.    2. The left ventricle is upper normal in size with normal wall thickness.    3. The left ventricular systolic function is moderately to severely  decreased, LVEF is visually estimated at 35-40%.    4. There is grade I diastolic dysfunction (impaired relaxation).    5. The right ventricle is normal in size, with normal systolic function.    01/20/2022 Imaging   1. Stable emphysematous changes but no acute overlying pulmonary process or worrisome pulmonary lesions to suggest metastatic disease. 2. No mediastinal or hilar mass or adenopathy. 3. Diffuse fatty infiltration of the liver.   Emphysema (ICD10-J43.9).   01/20/2022 Imaging   Within the limitations of a non-contrast enhanced exam,   1. Postsurgical changes in the right neck without evidence of recurrent disease. There is persistent soft tissue thickening in the right level 2A lymph node station, which grossly appears unchanged compared to 02/24/2020 CT of the neck. 2. No new lymphadenopathy.     02/11/2022 Procedure   Successful removal of the right chest Port-A-Cath.       PHYSICAL EXAMINATION: ECOG PERFORMANCE STATUS: 1 - Symptomatic but completely ambulatory  Vitals:  04/21/22 1022  BP: (!) 136/99  Pulse: (!) 114  Resp: 18  Temp: 98.6 F (37 C)  SpO2: 98%   Filed Weights   04/21/22 1022  Weight: 292 lb 9.6 oz (132.7 kg)    GENERAL:alert, no distress and comfortable NEURO: alert & oriented x 3 with fluent speech, no focal motor/sensory deficits  LABORATORY DATA:  I have reviewed the data as listed    Component Value Date/Time   NA 137 04/21/2022 1005   NA 141 04/05/2022 1506   NA 141 04/20/2017 1028   K 4.5 04/21/2022 1005   K 4.3 04/20/2017 1028   CL 99 04/21/2022 1005   CO2 29 04/21/2022 1005   CO2 26 04/20/2017 1028   GLUCOSE 93 04/21/2022 1005   GLUCOSE 96 04/20/2017 1028   BUN 11 04/21/2022 1005   BUN 10 04/05/2022 1506   BUN 16.7 04/20/2017 1028    CREATININE 1.65 (H) 04/21/2022 1005   CREATININE 1.65 (H) 01/06/2022 1408   CREATININE 2.0 (H) 04/20/2017 1028   CALCIUM 9.9 04/21/2022 1005   CALCIUM 9.0 04/20/2017 1028   PROT 7.2 04/21/2022 1005   PROT 7.0 04/20/2017 1028   ALBUMIN 4.2 04/21/2022 1005   ALBUMIN 4.0 04/20/2017 1028   AST 75 (H) 04/21/2022 1005   AST 35 01/06/2022 1408   AST 20 04/20/2017 1028   ALT 92 (H) 04/21/2022 1005   ALT 32 01/06/2022 1408   ALT 17 04/20/2017 1028   ALKPHOS 46 04/21/2022 1005   ALKPHOS 61 04/20/2017 1028   BILITOT 0.4 04/21/2022 1005   BILITOT 0.6 01/06/2022 1408   BILITOT 0.49 04/20/2017 1028   GFRNONAA 49 (L) 04/21/2022 1005   GFRNONAA 49 (L) 01/06/2022 1408   GFRAA 58 (L) 01/01/2020 1127    No results found for: "SPEP", "UPEP"  Lab Results  Component Value Date   WBC 6.0 04/21/2022   NEUTROABS 4.1 04/21/2022   HGB 13.7 04/21/2022   HCT 41.8 04/21/2022   MCV 96.1 04/21/2022   PLT 338 04/21/2022      Chemistry      Component Value Date/Time   NA 137 04/21/2022 1005   NA 141 04/05/2022 1506   NA 141 04/20/2017 1028   K 4.5 04/21/2022 1005   K 4.3 04/20/2017 1028   CL 99 04/21/2022 1005   CO2 29 04/21/2022 1005   CO2 26 04/20/2017 1028   BUN 11 04/21/2022 1005   BUN 10 04/05/2022 1506   BUN 16.7 04/20/2017 1028   CREATININE 1.65 (H) 04/21/2022 1005   CREATININE 1.65 (H) 01/06/2022 1408   CREATININE 2.0 (H) 04/20/2017 1028      Component Value Date/Time   CALCIUM 9.9 04/21/2022 1005   CALCIUM 9.0 04/20/2017 1028   ALKPHOS 46 04/21/2022 1005   ALKPHOS 61 04/20/2017 1028   AST 75 (H) 04/21/2022 1005   AST 35 01/06/2022 1408   AST 20 04/20/2017 1028   ALT 92 (H) 04/21/2022 1005   ALT 32 01/06/2022 1408   ALT 17 04/20/2017 1028   BILITOT 0.4 04/21/2022 1005   BILITOT 0.6 01/06/2022 1408   BILITOT 0.49 04/20/2017 1028

## 2022-04-21 NOTE — Assessment & Plan Note (Signed)
Suspect this is due to fatty liver disease Observe only We discussed importance of weight loss

## 2022-04-21 NOTE — Telephone Encounter (Signed)
-----   Message from [name], MD sent at 04/21/2022  3:36 PM EST ----- Pls call him B12 is better, he can switch to every 3 months inj (pls make changes to his med) For TSH, it is better but still high, please e-scribe 100 mcg synthroid, will recheck in 6 mo

## 2022-04-21 NOTE — Assessment & Plan Note (Signed)
His TSH is pending and we will adjust the dose of his Synthroid as needed

## 2022-04-21 NOTE — Assessment & Plan Note (Signed)
He has intermittent elevated serum creatinine Observe closely

## 2022-04-22 ENCOUNTER — Other Ambulatory Visit (HOSPITAL_COMMUNITY): Payer: Self-pay

## 2022-04-22 ENCOUNTER — Ambulatory Visit: Payer: Medicaid Other

## 2022-04-22 NOTE — Progress Notes (Incomplete)
Patient ID: Mason [name]                 DOB: 01/09/1966                      MRN: [id number]     HPI: Mason [name] is a 57 y.o. male referred by Dr. Harrington [name] to HTN clinic. PMH is significant for CHF, drug-induced pneumonitis, emphysema, restrictive lung disease, hypothyroidism, gout, CKDIII, HTN, MDD, and tonsillar cancer. Patient has a history of NICM with LVEF in 2019 of 15% with reduced RV function. He was initially started on carvedilol, hydralazine, and a nitrate. He was seen by Dr. Burt [name] in 2020 for L sided chest pain. Repeat echo in 05/2018 showed LVEF improved to 35-40% with enlarged but normally functioning RV. After that, patient had stopped taking his hydralazine and nitrate and echo on 07/2018 showed LVEF was again reduced to 25-30%. Hydralazine and Imdur were started and LVEF on 11/2021 was up to 35-40%. Patient was seen by [facility] cardiology on 11/23/21 per Care Everywhere and was noted to be on Coreg and Imdur with no hydralazine. Patient was last seen by Dr. Harrington [name] on 12/27/2021 where his BP was 150/102 mmHg. At that time, Mason [name] reported normal BPs at home. He was referred to PharmD HTN clinic to bring his BP cuff for validation and titration of medications if indicated.  At visit on 11/27 BP was 140/86 in clinic. Mason [name] was added and metoprolol was increased to '100mg'$  daily. At last visit 12/19, Delene Loll was increased to 49-'51mg'$  BID and metoprolol increased to '150mg'$  daily. CMP yesterday showed stable scr (1.65).  Patient presents today for follow up. He reports feeling great. Denies swelling, SOB, dizziness or lightheadedness. States his weight at home has been stable. He says his BP at home has been normal. He called his girlfriend for his last few readings. She also reported them being within in range- but values were 140/80 and 160/82.   Current HTN meds: Imdur 30 mg daily, Toprol-XL 150 mg daily, Entresto 49-51 mg BID, Farxiga '10mg'$  daily Previously tried: Hydralazine (not  stopped for any ADE) BP goal: <130/80 mmHg  Family History:  Family History  Problem Relation Age of Onset   Arthritis Mother    Heart disease Mother    Arthritis Father    CVA Other    Diabetes Other    Heart attack Other    Heart disease Maternal Grandmother    CVA Maternal Grandmother     Social History:  Social History   Socioeconomic History   Marital status: Divorced    Spouse name: Not on file   Number of children: 1   Years of education: Not on file   Highest education level: Not on file  Occupational History   Not on file  Tobacco Use   Smoking status: Former    Packs/day: 1.00    Years: 20.00    Total pack years: 20.00    Types: Cigarettes    Quit date: 07/16/2003    Years since quitting: 18.7   Smokeless tobacco: Never  Vaping Use   Vaping Use: Never used  Substance and Sexual Activity   Alcohol use: No    Comment: none years ago   Drug use: Yes    Types: Marijuana, Benzodiazepines    Comment: years ago   Sexual activity: Never  Other Topics Concern   Not on file  Social History Narrative   Not on file  Social Determinants of Health   Financial Resource Strain: Not on file  Food Insecurity: Not on file  Transportation Needs: Not on file  Physical Activity: Not on file  Stress: Not on file  Social Connections: Not on file    Diet: Patient reports diet is high in salt and includes ~8 water bottles per day. Discussed general tips for a heart healthy diet and salt substitutes.  Exercise: Patient reports walking with his wife several times per week  Home BP readings:  138/88 195/90 130/85 180/100 110/74  Wt Readings from Last 3 Encounters:  04/21/22 292 lb 9.6 oz (132.7 kg)  04/05/22 294 lb (133.4 kg)  01/20/22 285 lb 12.8 oz (129.6 kg)   BP Readings from Last 3 Encounters:  04/21/22 (!) 136/99  04/05/22 (!) 110/90  03/14/22 (!) 140/86   Pulse Readings from Last 3 Encounters:  04/21/22 (!) 114  04/05/22 (!) 107  03/14/22 70     Renal function: Estimated Creatinine Clearance: 78.1 mL/min (A) (by C-G formula based on SCr of 1.65 mg/dL (H)).  Past Medical History:  Diagnosis Date   Abnormal liver function test    Acute sinusitis, unspecified 05/18/2015   Anemia    Anxiety    mild new dx   Arthritis    knees,hips   Bilateral edema of lower extremity    Complication of anesthesia    Pt stated " my oxygen level was slow in rising."   Concussion    Hx: in high school x 2   Constipation    Dysuria 09/04/2015   Fever    Hyperactive gag reflex    Hypertension    Hypoglycemia    Hypokalemia    Insomnia 06/15/2015   Knee pain, chronic    Malnutrition ([place])    Non-healing surgical wound 05/23/2014   PEG (percutaneous endoscopic gastrostomy) status ([place])    Renal failure, acute ([place])    S/P radiation therapy 08/19/2013-10/15/2013   Right Tonstil and bilateral neck / 70 Gy in 35 fractions to gross disease, 63 Gy in 35 fractions to high risk nodal echelons, and 56 Gy in 35 fractions to intermediate risk nodal echelons   Severe nausea and vomiting    Status post chemotherapy    Only received 2 doses due to uncontrolled nausea and acute renal failure   Tonsillar cancer ([place]) 07/09/2013   SCCA of Right Tonsil, recurrent 2016   Vitamin B12 deficiency 01/07/2022    Current Outpatient Medications on File Prior to Visit  Medication Sig Dispense Refill   aspirin EC 81 MG tablet Take 81 mg by mouth daily.     cyanocobalamin (VITAMIN B12) 1000 MCG/ML injection Inject 1 mL (1,000 mcg total) into the muscle every 30 (thirty) days. (Patient taking differently: Inject 1,000 mcg into the muscle every 3 (three) months. Updated 04/21/22) 10 mL 0   dapagliflozin propanediol (FARXIGA) 10 MG TABS tablet Take 1 tablet (10 mg total) by mouth daily before breakfast. 90 tablet 3   isosorbide mononitrate (IMDUR) 30 MG 24 hr tablet Take 1 tablet (30 mg total) by mouth daily. 90 tablet 2   levothyroxine (SYNTHROID) 100 MCG tablet Take  1 tablet (100 mcg total) by mouth daily before breakfast. 30 tablet 5   metoprolol succinate (TOPROL-XL) 100 MG 24 hr tablet Take 1 and 1/2 tablets (150 mg total) by mouth daily. 135 tablet 3   omeprazole (PRILOSEC) 20 MG capsule Take 1 capsule (20 mg total) by mouth daily. 30 capsule 0   oxyCODONE (  ROXICODONE) 15 MG immediate release tablet Take 1 tablet (15 mg total) by mouth every 6 (six) hours as needed. 90 tablet 0   sacubitril-valsartan (ENTRESTO) 49-51 MG Take 1 tablet by mouth 2 (two) times daily. 60 tablet 5   Current Facility-Administered Medications on File Prior to Visit  Medication Dose Route Frequency Provider Last Rate Last Admin   sodium chloride 0.9 % injection 10 mL  10 mL Intravenous PRN Alvy Bimler, Ni, MD   10 mL at 11/29/16 0818   sodium chloride 0.9 % injection 10 mL  10 mL Intravenous PRN [name], MD        Allergies  Allergen Reactions   Bee Pollen     Watery eyes, runny nose, sneezing   Pollen Extract Other (See Comments)    Watery eyes, runny nose, sneezing     Assessment/Plan:  1. Hypertension/CHF - Blood pressure today of 130/78 and then 110/90 is above goal on Imdur 30 mg daily, Toprol-XL 100 mg daily, and Entresto 24-26 mg BID and Farxiga. Home readings are also elevated. Did not bring in home cuff so accuracy is unknown. Given history of HFrEF, patient would benefit from maximizing 4 pillars of heart failure. Heart rate is elevated today. Will check BMP today, if stable, will increase Entresto to 49/'51mg'$  BID. May also consider increasing metoprolol succinate due to HR >100 in clinic today. He will need f/u scheduled as well.   Ramond Dial, Pharm.D, BCPS, CPP [facility] Division of [facility] [address] [place], [place] [address]  Phone: [phone]; Fax: [phone]

## 2022-04-25 ENCOUNTER — Telehealth: Payer: Self-pay | Admitting: Hematology and Oncology

## 2022-04-25 ENCOUNTER — Other Ambulatory Visit (HOSPITAL_COMMUNITY): Payer: Self-pay

## 2022-04-25 NOTE — Telephone Encounter (Signed)
Forms faxed with confirmation.

## 2022-04-25 NOTE — Telephone Encounter (Signed)
Scheduled appointment per 1/4 los. Patient is aware.

## 2022-04-26 ENCOUNTER — Other Ambulatory Visit (HOSPITAL_COMMUNITY): Payer: Self-pay

## 2022-04-26 ENCOUNTER — Other Ambulatory Visit: Payer: Self-pay

## 2022-04-26 ENCOUNTER — Telehealth: Payer: Self-pay

## 2022-04-26 ENCOUNTER — Encounter: Payer: Self-pay | Admitting: Internal Medicine

## 2022-04-26 MED ORDER — OMEPRAZOLE 20 MG PO CPDR
20.0000 mg | DELAYED_RELEASE_CAPSULE | Freq: Every day | ORAL | 2 refills | Status: DC
  Filled 2022-04-26 – 2023-02-21 (×12): qty 30, 30d supply, fill #0
  Filled 2023-03-17: qty 30, 30d supply, fill #1
  Filled 2023-04-09: qty 30, 30d supply, fill #2

## 2022-04-26 NOTE — Telephone Encounter (Signed)
Pls let him know and call for refill on 1/19

## 2022-04-26 NOTE — Telephone Encounter (Signed)
Received a message from scheduler that he needs a refill on Oxycodone Rx.

## 2022-04-26 NOTE — Telephone Encounter (Signed)
It says that the prescription was filled on 12/29 but written on 12/20? Can you call [facility] to verify when it was filled?

## 2022-04-26 NOTE — Telephone Encounter (Signed)
Called and given below message to [facility]. Mason [name] is asleep. She verbalized understanding and requesting refill of Omeprazole Rx. Rx sent.

## 2022-04-26 NOTE — Telephone Encounter (Signed)
Called pharmacy. Prescription was filled 12/8 and 12/29. It was too soon and pharmacy could not refill until 12/29. Per pharmacy the next time they can refill oxycodone is 1/19.

## 2022-04-27 ENCOUNTER — Ambulatory Visit: Payer: Medicaid Other

## 2022-04-27 ENCOUNTER — Other Ambulatory Visit (HOSPITAL_COMMUNITY): Payer: Self-pay

## 2022-04-27 ENCOUNTER — Other Ambulatory Visit: Payer: Self-pay

## 2022-04-27 NOTE — Progress Notes (Deleted)
Patient ID: Mason [name]                 DOB: Jan 14, 1966                      MRN: [id number]     HPI: Mason [name] is a 57 y.o. male referred by Dr. Harrington [name] to HTN clinic. PMH is significant for CHF, drug-induced pneumonitis, emphysema, restrictive lung disease, hypothyroidism, gout, CKDIII, HTN, MDD, and tonsillar cancer. Patient has a history of NICM with LVEF in 2019 of 15% with reduced RV function. He was initially started on carvedilol, hydralazine, and a nitrate. He was seen by Dr. Burt [name] in 2020 for L sided chest pain. Repeat echo in 05/2018 showed LVEF improved to 35-40% with enlarged but normally functioning RV. After that, patient had stopped taking his hydralazine and nitrate and echo on 07/2018 showed LVEF was again reduced to 25-30%. Hydralazine and Imdur were started and LVEF on 11/2021 was up to 35-40%. Patient was seen by [name] cardiology on 11/23/21 per Care Everywhere and was noted to be on Coreg and Imdur with no hydralazine. Patient was last seen by Dr. Harrington [name] on 12/27/2021 where his BP was 150/102 mmHg. At that time, Mason [name] reported normal BPs at home. He was referred to PharmD HTN clinic to bring his BP cuff for validation and titration of medications if indicated.  At visit on 11/27 BP was 140/86 in clinic. Mason [name] was added and metoprolol was increased to 163m daily. At last visit 12/19, Mason Lollwas increased to 49-572mBID and metoprolol increased to 1507maily. CMP showed stable scr (1.65).  Patient presents today for follow up. He reports feeling great. Denies swelling, SOB, dizziness or lightheadedness. States his weight at home has been stable. He says his BP at home has been normal. He called his girlfriend for his last few readings. She also reported them being within in range- but values were 140/80 and 160/82.   Current HTN meds: Imdur 30 mg daily, Toprol-XL 150 mg daily, Entresto 49-51 mg BID, Farxiga 8m26mily Previously tried: Hydralazine (not stopped for any  ADE) BP goal: <130/80 mmHg  Family History:  Family History  Problem Relation Age of Onset   Arthritis Mother    Heart disease Mother    Arthritis Father    CVA Other    Diabetes Other    Heart attack Other    Heart disease Maternal Grandmother    CVA Maternal Grandmother     Social History:  Social History   Socioeconomic History   Marital status: Divorced    Spouse name: Not on file   Number of children: 1   Years of education: Not on file   Highest education level: Not on file  Occupational History   Not on file  Tobacco Use   Smoking status: Former    Packs/day: 1.00    Years: 20.00    Total pack years: 20.00    Types: Cigarettes    Quit date: 07/16/2003    Years since quitting: 18.7   Smokeless tobacco: Never  Vaping Use   Vaping Use: Never used  Substance and Sexual Activity   Alcohol use: No    Comment: none years ago   Drug use: Yes    Types: Marijuana, Benzodiazepines    Comment: years ago   Sexual activity: Never  Other Topics Concern   Not on file  Social History Narrative   Not on file  Social Determinants of Health   Financial Resource Strain: Not on file  Food Insecurity: Not on file  Transportation Needs: Not on file  Physical Activity: Not on file  Stress: Not on file  Social Connections: Not on file    Diet: Patient reports diet is high in salt and includes ~8 water bottles per day. Discussed general tips for a heart healthy diet and salt substitutes.  Exercise: Patient reports walking with his wife several times per week  Home BP readings:  138/88 195/90 130/85 180/100 110/74  Wt Readings from Last 3 Encounters:  04/21/22 292 lb 9.6 oz (132.7 kg)  04/05/22 294 lb (133.4 kg)  01/20/22 285 lb 12.8 oz (129.6 kg)   BP Readings from Last 3 Encounters:  04/21/22 (!) 136/99  04/05/22 (!) 110/90  03/14/22 (!) 140/86   Pulse Readings from Last 3 Encounters:  04/21/22 (!) 114  04/05/22 (!) 107  03/14/22 70    Renal  function: Estimated Creatinine Clearance: 78.1 mL/min (A) (by C-G formula based on SCr of 1.65 mg/dL (H)).  Past Medical History:  Diagnosis Date   Abnormal liver function test    Acute sinusitis, unspecified 05/18/2015   Anemia    Anxiety    mild new dx   Arthritis    knees,hips   Bilateral edema of lower extremity    Complication of anesthesia    Pt stated " my oxygen level was slow in rising."   Concussion    Hx: in high school x 2   Constipation    Dysuria 09/04/2015   Fever    Hyperactive gag reflex    Hypertension    Hypoglycemia    Hypokalemia    Insomnia 06/15/2015   Knee pain, chronic    Malnutrition (Mason [name])    Non-healing surgical wound 05/23/2014   PEG (percutaneous endoscopic gastrostomy) status (Mason [name])    Renal failure, acute (Mason [name])    S/P radiation therapy 08/19/2013-10/15/2013   Right Tonstil and bilateral neck / 70 Gy in 35 fractions to gross disease, 63 Gy in 35 fractions to high risk nodal echelons, and 56 Gy in 35 fractions to intermediate risk nodal echelons   Severe nausea and vomiting    Status post chemotherapy    Only received 2 doses due to uncontrolled nausea and acute renal failure   Tonsillar cancer (Mason [name]) 07/09/2013   Mason [name] of Right Tonsil, recurrent 2016   Vitamin B12 deficiency 01/07/2022    Current Outpatient Medications on File Prior to Visit  Medication Sig Dispense Refill   aspirin EC 81 MG tablet Take 81 mg by mouth daily.     cyanocobalamin (VITAMIN B12) 1000 MCG/ML injection Inject 1 mL (1,000 mcg total) into the muscle every 30 (thirty) days. (Patient taking differently: Inject 1,000 mcg into the muscle every 3 (three) months. Updated 04/21/22) 10 mL 0   dapagliflozin propanediol (FARXIGA) 10 MG TABS tablet Take 1 tablet (10 mg total) by mouth daily before breakfast. 90 tablet 3   isosorbide mononitrate (IMDUR) 30 MG 24 hr tablet Take 1 tablet (30 mg total) by mouth daily. 90 tablet 2   levothyroxine (SYNTHROID) 100 MCG tablet Take 1 tablet  (100 mcg total) by mouth daily before breakfast. 30 tablet 5   metoprolol succinate (TOPROL-XL) 100 MG 24 hr tablet Take 1 and 1/2 tablets (150 mg total) by mouth daily. 135 tablet 3   omeprazole (PRILOSEC) 20 MG capsule Take 1 capsule (20 mg total) by mouth daily. 30 capsule 2   oxyCODONE (  ROXICODONE) 15 MG immediate release tablet Take 1 tablet (15 mg total) by mouth every 6 (six) hours as needed. 90 tablet 0   sacubitril-valsartan (ENTRESTO) 49-51 MG Take 1 tablet by mouth 2 (two) times daily. 60 tablet 5   Current Facility-Administered Medications on File Prior to Visit  Medication Dose Route Frequency Provider Last Rate Last Admin   sodium chloride 0.9 % injection 10 mL  10 mL Intravenous PRN Alvy Bimler, Ni, MD   10 mL at 11/29/16 0818   sodium chloride 0.9 % injection 10 mL  10 mL Intravenous PRN [name], MD        Allergies  Allergen Reactions   Bee Pollen     Watery eyes, runny nose, sneezing   Pollen Extract Other (See Comments)    Watery eyes, runny nose, sneezing     Assessment/Plan:  1. Hypertension/CHF - Blood pressure today of 130/78 and then 110/90 is above goal on Imdur 30 mg daily, Toprol-XL 100 mg daily, and Entresto 24-26 mg BID and Farxiga. Home readings are also elevated. Did not bring in home cuff so accuracy is unknown. Given history of HFrEF, patient would benefit from maximizing 4 pillars of heart failure. Heart rate is elevated today. Will check BMP today, if stable, will increase Entresto to 49/18m BID. May also consider increasing metoprolol succinate due to HR >100 in clinic today. He will need f/u scheduled as well.   MRamond Dial Pharm.D, BCPS, CPP [facility] [place]  [address] Phone: [phone] Fax: [phone]

## 2022-04-28 ENCOUNTER — Inpatient Hospital Stay: Payer: Medicaid Other | Admitting: Hematology and Oncology

## 2022-04-28 ENCOUNTER — Inpatient Hospital Stay: Payer: Medicaid Other

## 2022-05-07 ENCOUNTER — Other Ambulatory Visit: Payer: Self-pay | Admitting: Hematology and Oncology

## 2022-05-09 ENCOUNTER — Other Ambulatory Visit (HOSPITAL_COMMUNITY): Payer: Self-pay

## 2022-05-09 MED ORDER — OXYCODONE HCL 15 MG PO TABS
15.0000 mg | ORAL_TABLET | Freq: Four times a day (QID) | ORAL | 0 refills | Status: DC | PRN
  Filled 2022-05-09: qty 90, 23d supply, fill #0

## 2022-05-20 ENCOUNTER — Other Ambulatory Visit: Payer: Self-pay

## 2022-05-31 ENCOUNTER — Other Ambulatory Visit: Payer: Self-pay | Admitting: Hematology and Oncology

## 2022-05-31 ENCOUNTER — Other Ambulatory Visit (HOSPITAL_COMMUNITY): Payer: Self-pay

## 2022-05-31 MED ORDER — OXYCODONE HCL 15 MG PO TABS
15.0000 mg | ORAL_TABLET | Freq: Four times a day (QID) | ORAL | 0 refills | Status: DC | PRN
  Filled 2022-05-31: qty 90, 23d supply, fill #0

## 2022-06-06 ENCOUNTER — Ambulatory Visit: Payer: Medicaid Other | Attending: Cardiovascular Disease | Admitting: Pharmacist

## 2022-06-06 ENCOUNTER — Other Ambulatory Visit (HOSPITAL_COMMUNITY): Payer: Self-pay

## 2022-06-06 VITALS — BP 116/80 | HR 104 | Wt 283.0 lb

## 2022-06-06 DIAGNOSIS — I5022 Chronic systolic (congestive) heart failure: Secondary | ICD-10-CM | POA: Insufficient documentation

## 2022-06-06 MED ORDER — METOPROLOL SUCCINATE ER 200 MG PO TB24
200.0000 mg | ORAL_TABLET | Freq: Every day | ORAL | 3 refills | Status: DC
  Filled 2022-06-06: qty 90, 90d supply, fill #0
  Filled 2022-08-24 – 2022-08-30 (×2): qty 90, 90d supply, fill #1
  Filled 2022-12-03: qty 90, 90d supply, fill #2
  Filled 2023-03-02 – 2023-03-17 (×2): qty 90, 90d supply, fill #3

## 2022-06-06 MED ORDER — SPIRONOLACTONE 25 MG PO TABS
12.5000 mg | ORAL_TABLET | Freq: Every day | ORAL | 3 refills | Status: DC
  Filled 2022-06-06: qty 45, 90d supply, fill #0
  Filled 2022-08-24 – 2022-08-30 (×2): qty 45, 90d supply, fill #1
  Filled 2022-12-03: qty 45, 90d supply, fill #2
  Filled 2023-03-02 – 2023-03-17 (×2): qty 45, 90d supply, fill #3

## 2022-06-06 NOTE — Patient Instructions (Addendum)
Your blood pressure looks excellent today  Increase your metoprolol from 123m to 2043mdaily. You can take 2 of your current 10058mablets each day until you run out, then your next refill at the pharmacy will be for a higher dose of 200m4m go back to taking 1 a day  Stop taking your torsemide and potassium  Start taking spironolactone 12.5mg 40m2 tablet) once daily  Continue monitoring your blood pressure at home  Recheck blood pressure and lab work next Monday at 2pm

## 2022-06-06 NOTE — Progress Notes (Signed)
Patient ID: [name]                 DOB: [birth year]                      MRN: [id number]     HPI: Mason [name] is a 57 y.o. male referred by Dr. Harrington [name] to pharmacy clinic for HF medication management. PMH is significant for CHF and NICM with EF of 15% in 2019, CKD stage III, HTN, drug-induced pneumonitis, emphysema, restrictive lung disease, hypothyroidism, gout, MDD, and tonsillar cancer. He was initially started on carvedilol, hydralazine, and a nitrate. He was seen by Dr. Burt [name] in 2020 for L sided chest pain. Repeat echo in 05/2018 showed LVEF improved to 35-40% with enlarged but normally functioning RV. After that, patient had stopped taking his hydralazine and nitrate and echo on 07/2018 showed LVEF was again reduced to 25-30%. Hydralazine and Imdur were started and LVEF on 11/2021 was up to 35-40%. Patient was seen by [facility] [name] cardiology on 11/23/21 per Care Everywhere and was noted to be on Coreg and Imdur with no hydralazine. Patient was last seen by Dr. Harrington [name] on 12/27/2021 where his BP was 150/102 mmHg and he was referred to PharmD.   At last 2 PharmD visits, Mason [name] was started and Toprol and Entresto doses were increased. Scheduled for 2 week f/u but canceled. Has had f/u BMET on 04/21/22 which was stable.   Patient presents today for follow up. He reports feeling great. Denies swelling, SOB, CP, dizziness or lightheadedness. States his weight at home has been stable. Has been making improvements in his diet like reducing salt and increasing vegetables and lean meat - weight is down 9 lbs in past 6 weeks. Walking about 2 miles each day. Brings in home meds today - taking torsemide '10mg'$  daily and potassium chloride 64mq daily prescribed by PCP last month, we did not have these on our med list. Didn't bring in home cuff today, reports home readings look good, thinks he recalls a systolic reading of [id number] Reports no missed doses of his meds, his fiance helps organize meds.  Current HF meds:   Farxiga '10mg'$  daily Imdur '30mg'$  daily Toprol-XL '150mg'$  daily Entresto 49-'51mg'$  BID - 8am and 9pm  Previously tried: Hydralazine (not stopped for any ADE)  BP goal: <130/80 mmHg  Diet: More vegetables, baked meat, less salt.   Exercise: Walking more - 2 walking 2 miles a day  Family History: Mother with arthritis and heart disease, father with arthritis, maternal grandmother with CVA and heart disease.  Social History: Former tobacco use, 1 PPD for 20 years, quit in 2005, former alcohol, marijuana, benzo use but not currently.  Wt Readings from Last 3 Encounters:  04/21/22 292 lb 9.6 oz (132.7 kg)  04/05/22 294 lb (133.4 kg)  01/20/22 285 lb 12.8 oz (129.6 kg)   BP Readings from Last 3 Encounters:  04/21/22 (!) 136/99  04/05/22 (!) 110/90  03/14/22 (!) 140/86   Pulse Readings from Last 3 Encounters:  04/21/22 (!) 114  04/05/22 (!) 107  03/14/22 70    Renal function: CrCl cannot be calculated (Patient's most recent lab result is older than the maximum 21 days allowed.).  Past Medical History:  Diagnosis Date   Abnormal liver function test    Acute sinusitis, unspecified 05/18/2015   Anemia    Anxiety    mild new dx   Arthritis    knees,hips   Bilateral edema of lower  extremity    Complication of anesthesia    Pt stated " my oxygen level was slow in rising."   Concussion    Hx: in high school x 2   Constipation    Dysuria 09/04/2015   Fever    Hyperactive gag reflex    Hypertension    Hypoglycemia    Hypokalemia    Insomnia 06/15/2015   Knee pain, chronic    Malnutrition ([place])    Non-healing surgical wound 05/23/2014   PEG (percutaneous endoscopic gastrostomy) status ([place])    Renal failure, acute ([place])    S/P radiation therapy 08/19/2013-10/15/2013   Right Tonstil and bilateral neck / 70 Gy in 35 fractions to gross disease, 63 Gy in 35 fractions to high risk nodal echelons, and 56 Gy in 35 fractions to intermediate risk nodal echelons   Severe nausea and  vomiting    Status post chemotherapy    Only received 2 doses due to uncontrolled nausea and acute renal failure   Tonsillar cancer ([place]) 07/09/2013   SCCA of Right Tonsil, recurrent 2016   Vitamin B12 deficiency 01/07/2022    Current Outpatient Medications on File Prior to Visit  Medication Sig Dispense Refill   aspirin EC 81 MG tablet Take 81 mg by mouth daily.     cyanocobalamin (VITAMIN B12) 1000 MCG/ML injection Inject 1 mL (1,000 mcg total) into the muscle every 30 (thirty) days. (Patient taking differently: Inject 1,000 mcg into the muscle every 3 (three) months. Updated 04/21/22) 10 mL 0   dapagliflozin propanediol (FARXIGA) 10 MG TABS tablet Take 1 tablet (10 mg total) by mouth daily before breakfast. 90 tablet 3   isosorbide mononitrate (IMDUR) 30 MG 24 hr tablet Take 1 tablet (30 mg total) by mouth daily. 90 tablet 2   levothyroxine (SYNTHROID) 100 MCG tablet Take 1 tablet (100 mcg total) by mouth daily before breakfast. 30 tablet 5   metoprolol succinate (TOPROL-XL) 100 MG 24 hr tablet Take 1 and 1/2 tablets (150 mg total) by mouth daily. 135 tablet 3   omeprazole (PRILOSEC) 20 MG capsule Take 1 capsule (20 mg total) by mouth daily. 30 capsule 2   oxyCODONE (ROXICODONE) 15 MG immediate release tablet Take 1 tablet by mouth every 6 hours as needed. 90 tablet 0   sacubitril-valsartan (ENTRESTO) 49-51 MG Take 1 tablet by mouth 2 (two) times daily. 60 tablet 5   Current Facility-Administered Medications on File Prior to Visit  Medication Dose Route Frequency Provider Last Rate Last Admin   sodium chloride 0.9 % injection 10 mL  10 mL Intravenous PRN Alvy Bimler, Ni, MD   10 mL at 11/29/16 0818   sodium chloride 0.9 % injection 10 mL  10 mL Intravenous PRN [name], MD        Allergies  Allergen Reactions   Bee Pollen     Watery eyes, runny nose, sneezing   Pollen Extract Other (See Comments)    Watery eyes, runny nose, sneezing     Assessment/Plan:  1. CHF with EF 35-40% -  BP well controlled today with room to further optimize CHF regimen.   -Increase Toprol from '150mg'$  to '200mg'$  daily -Start spironolactone 12.'5mg'$  daily -Stop torsemide '10mg'$  daily and KCl 78mq daily -Continue Farxiga '10mg'$  daily, Entresto 49-'51mg'$  BID, and Imdur '30mg'$  daily  Repeat BP and BMET in 1 week. Pt to bring in home BP cuff to confirm accuracy. Can further increase spironolactone or Entresto pending results of BMET at that time. Will need echo rechecked  3 months after pt is on GDMT.  Sharaya Boruff E. Nakeisha Greenhouse, PharmD, BCACP, [place]. [address], [place], [place] [address] Phone: [phone]; Fax: [phone] 06/06/2022 3:03 PM

## 2022-06-09 ENCOUNTER — Other Ambulatory Visit (HOSPITAL_COMMUNITY): Payer: Self-pay

## 2022-06-13 ENCOUNTER — Ambulatory Visit: Payer: Medicaid Other

## 2022-06-13 NOTE — Progress Notes (Unsigned)
Patient ID: Mason [name]                 DOB: Aug 15, 1965                      MRN: [id number]     HPI: Mason [name] is a 57 y.o. male referred by Dr. Harrington [name] to pharmacy clinic for HF medication management. PMH is significant for CHF and NICM with EF of 15% in 2019, CKD stage III, HTN, drug-induced pneumonitis, emphysema, restrictive lung disease, hypothyroidism, gout, MDD, and tonsillar cancer. He was initially started on carvedilol, hydralazine, and a nitrate. He was seen by Dr. Burt [name] in 2020 for L sided chest pain. Repeat echo in 05/2018 showed LVEF improved to 35-40% with enlarged but normally functioning RV. After that, patient had stopped taking his hydralazine and nitrate and echo on 07/2018 showed LVEF was again reduced to 25-30%. Hydralazine and Imdur were started and LVEF on 11/2021 was up to 35-40%. Patient was seen by [facility] [name] cardiology on 11/23/21 per [facility] Everywhere and was noted to be on Coreg and Imdur with no hydralazine. Patient was last seen by Dr. Harrington [name] on 12/27/2021 where his BP was 150/102 mmHg and he was referred to PharmD.   At recent PharmD visits, Mason [name] was started and Toprol and Entresto doses were increased. Scheduled for 2 week f/u but canceled. Had f/u BMET on 04/21/22 which was stable.  I saw pt a week ago on 2/19 for his CHF. BP well controlled, CHF meds further optimized by increasing Toprol from '150mg'$  to '200mg'$  daily, starting spironolactone 12.'5mg'$  daily, and stopping torsemide '10mg'$  daily and KCl 41mq daily (PCP had prescribed them both for 1 month, we were unaware pt was taking, no LE noted on exam). Pt had been making improvements in his diet like reducing salt and increasing vegetables and lean meat - weight was down 9 lbs in past 6 weeks, and he was walking about 2 miles each day.   Weight and bmet today Confirm pt made med changes - inc toprol, start spiro, stop torsemide/kcl Swelling? Home bp? If bmet stable today, inc entresto   Current HF meds:  FIran '10mg'$  daily Toprol-XL '200mg'$  daily Entresto 49-'51mg'$  BID - 8am and 9pm Spironolactone 12.'5mg'$  daily Imdur '30mg'$  daily  Previously tried: Hydralazine (not stopped for any ADE)  BP goal: <130/80 mmHg  Diet: More vegetables, baked meat, less salt.   Exercise: Walking more - 2 walking 2 miles a day  Family History: Mother with arthritis and heart disease, father with arthritis, maternal grandmother with CVA and heart disease.  Social History: Former tobacco use, 1 PPD for 20 years, quit in 2005, former alcohol, marijuana, benzo use but not currently.  Wt Readings from Last 3 Encounters:  06/06/22 283 lb (128.4 kg)  04/21/22 292 lb 9.6 oz (132.7 kg)  04/05/22 294 lb (133.4 kg)   BP Readings from Last 3 Encounters:  06/06/22 116/80  04/21/22 (!) 136/99  04/05/22 (!) 110/90   Pulse Readings from Last 3 Encounters:  06/06/22 (!) 104  04/21/22 (!) 114  04/05/22 (!) 107    Renal function: CrCl cannot be calculated (Patient's most recent lab result is older than the maximum 21 days allowed.).  Past Medical History:  Diagnosis Date   Abnormal liver function test    Acute sinusitis, unspecified 05/18/2015   Anemia    Anxiety    mild new dx   Arthritis    knees,hips   Bilateral  edema of lower extremity    Complication of anesthesia    Pt stated " my oxygen level was slow in rising."   Concussion    Hx: in high school x 2   Constipation    Dysuria 09/04/2015   Fever    Hyperactive gag reflex    Hypertension    Hypoglycemia    Hypokalemia    Insomnia 06/15/2015   Knee pain, chronic    Malnutrition ([place])    Non-healing surgical wound 05/23/2014   PEG (percutaneous endoscopic gastrostomy) status ([place])    Renal failure, acute ([place])    S/P radiation therapy 08/19/2013-10/15/2013   Right Tonstil and bilateral neck / 70 Gy in 35 fractions to gross disease, 63 Gy in 35 fractions to high risk nodal echelons, and 56 Gy in 35 fractions to intermediate risk nodal echelons   Severe  nausea and vomiting    Status post chemotherapy    Only received 2 doses due to uncontrolled nausea and acute renal failure   Tonsillar cancer ([place]) 07/09/2013   SCCA of Right Tonsil, recurrent 2016   Vitamin B12 deficiency 01/07/2022    Current Outpatient Medications on File Prior to Visit  Medication Sig Dispense Refill   aspirin EC 81 MG tablet Take 81 mg by mouth daily.     cyanocobalamin (VITAMIN B12) 1000 MCG/ML injection Inject 1 mL (1,000 mcg total) into the muscle every 30 (thirty) days. (Patient taking differently: Inject 1,000 mcg into the muscle every 3 (three) months. Updated 04/21/22) 10 mL 0   dapagliflozin propanediol (FARXIGA) 10 MG TABS tablet Take 1 tablet (10 mg total) by mouth daily before breakfast. 90 tablet 3   isosorbide mononitrate (IMDUR) 30 MG 24 hr tablet Take 1 tablet (30 mg total) by mouth daily. 90 tablet 2   levothyroxine (SYNTHROID) 100 MCG tablet Take 1 tablet (100 mcg total) by mouth daily before breakfast. 30 tablet 5   metoprolol (TOPROL XL) 200 MG 24 hr tablet Take 1 tablet (200 mg total) by mouth daily. 90 tablet 3   omeprazole (PRILOSEC) 20 MG capsule Take 1 capsule (20 mg total) by mouth daily. 30 capsule 2   oxyCODONE (ROXICODONE) 15 MG immediate release tablet Take 1 tablet by mouth every 6 hours as needed. 90 tablet 0   sacubitril-valsartan (ENTRESTO) 49-51 MG Take 1 tablet by mouth 2 (two) times daily. 60 tablet 5   spironolactone (ALDACTONE) 25 MG tablet Take 0.5 tablets (12.5 mg total) by mouth daily. 45 tablet 3   Current Facility-Administered Medications on File Prior to Visit  Medication Dose Route Frequency Provider Last Rate Last Admin   sodium chloride 0.9 % injection 10 mL  10 mL Intravenous PRN Alvy Bimler, Ni, MD   10 mL at 11/29/16 0818   sodium chloride 0.9 % injection 10 mL  10 mL Intravenous PRN [name], MD        Allergies  Allergen Reactions   Bee Pollen     Watery eyes, runny nose, sneezing   Pollen Extract Other (See  Comments)    Watery eyes, runny nose, sneezing     Assessment/Plan:  1. CHF with EF 35-40% - BP well controlled today with room to further optimize CHF regimen.   -Increase Toprol from '150mg'$  to '200mg'$  daily -Start spironolactone 12.'5mg'$  daily -Stop torsemide '10mg'$  daily and KCl 28mq daily -Continue Farxiga '10mg'$  daily, Entresto 49-'51mg'$  BID, and Imdur '30mg'$  daily  Repeat BP and BMET in 1 week. Pt to bring in home BP cuff  to confirm accuracy. Can further increase spironolactone or Entresto pending results of BMET at that time. Will need echo rechecked 3 months after pt is on GDMT.  Quintessa Simmerman E. Reyes Aldaco, PharmD, BCACP, [facility]. [address], [place], [place] [address] Phone: [phone]; Fax: [phone] 06/13/2022 2:10 PM

## 2022-06-13 NOTE — Progress Notes (Deleted)
Patient ID: ADYAN HARBESON                 DOB: May 17, 1965                      MRN: [id number]     HPI: Mason [name] is a 57 y.o. male referred by Dr. Harrington Challenger to pharmacy clinic for HF medication management. PMH is significant for CHF and NICM with EF of 15% in 2019, CKD stage III, HTN, drug-induced pneumonitis, emphysema, restrictive lung disease, hypothyroidism, gout, MDD, and tonsillar cancer. He was initially started on carvedilol, hydralazine, and a nitrate. He was seen by Dr. Burt Knack in 2020 for L sided chest pain. Repeat echo in 05/2018 showed LVEF improved to 35-40% with enlarged but normally functioning RV. After that, patient had stopped taking his hydralazine and nitrate and echo on 07/2018 showed LVEF was again reduced to 25-30%. Hydralazine and Imdur were started and LVEF on 11/2021 was up to 35-40%. Patient was seen by [facility] cardiology on 11/23/21 per Care Everywhere and was noted to be on Coreg and Imdur with no hydralazine. Patient was last seen by Dr. Harrington Challenger on 12/27/2021 where his BP was 150/102 mmHg and he was referred to PharmD.   At last 2 PharmD visits, [name] was started and Toprol and Entresto doses were increased. Scheduled for 2 week f/u but canceled. Has had f/u BMET on 04/21/22 which was stable.  I saw pt a week ago on 2/19 for his CHF. BP well controlled, CHF meds further optimized by increasing Toprol from '150mg'$  to '200mg'$  daily, starting spironolactone 12.'5mg'$  daily, and stopping torsemide '10mg'$  daily and KCl 36mq daily.  Weight and bmet today Swelling since starting spiro and stopping torsemide/kcl? Confirm inc toprol too Could add back on torsemide 10 prn if needed If bmet stable today, inc entresto Home bp?   Patient presents today for follow up. He reports feeling great. Denies swelling, SOB, CP, dizziness or lightheadedness. States his weight at home has been stable. Has been making improvements in his diet like reducing salt and increasing vegetables and lean meat -  weight is down 9 lbs in past 6 weeks. Walking about 2 miles each day. Brings in home meds today - taking torsemide '10mg'$  daily and potassium chloride 125m daily prescribed by PCP last month, we did not have these on our med list. Didn't bring in home cuff today, reports home readings look good, thinks he recalls a systolic reading of [id number] no missed doses of his meds, his fiance helps organize meds.  Current HF meds:  Farxiga '10mg'$  daily Toprol-XL '200mg'$  daily Entresto 49-'51mg'$  BID - 8am and 9pm Spironolactone 12.'5mg'$  daily Imdur '30mg'$  daily  Previously tried: Hydralazine (not stopped for any ADE)  BP goal: <130/80 mmHg  Diet: More vegetables, baked meat, less salt.   Exercise: Walking more - 2 walking 2 miles a day  Family History: Mother with arthritis and heart disease, father with arthritis, maternal grandmother with CVA and heart disease.  Social History: Former tobacco use, 1 PPD for 20 years, quit in 2005, former alcohol, marijuana, benzo use but not currently.  Wt Readings from Last 3 Encounters:  06/06/22 283 lb (128.4 kg)  04/21/22 292 lb 9.6 oz (132.7 kg)  04/05/22 294 lb (133.4 kg)   BP Readings from Last 3 Encounters:  06/06/22 116/80  04/21/22 (!) 136/99  04/05/22 (!) 110/90   Pulse Readings from Last 3 Encounters:  06/06/22 (!) 104  04/21/22 (!)  114  04/05/22 (!) 107    Renal function: CrCl cannot be calculated (Patient's most recent lab result is older than the maximum 21 days allowed.).  Past Medical History:  Diagnosis Date   Abnormal liver function test    Acute sinusitis, unspecified 05/18/2015   Anemia    Anxiety    mild new dx   Arthritis    knees,hips   Bilateral edema of lower extremity    Complication of anesthesia    Pt stated " my oxygen level was slow in rising."   Concussion    Hx: in high school x 2   Constipation    Dysuria 09/04/2015   Fever    Hyperactive gag reflex    Hypertension    Hypoglycemia    Hypokalemia     Insomnia 06/15/2015   Knee pain, chronic    Malnutrition ([place])    Non-healing surgical wound 05/23/2014   PEG (percutaneous endoscopic gastrostomy) status ([place])    Renal failure, acute ([place])    S/P radiation therapy 08/19/2013-10/15/2013   Right Tonstil and bilateral neck / 70 Gy in 35 fractions to gross disease, 63 Gy in 35 fractions to high risk nodal echelons, and 56 Gy in 35 fractions to intermediate risk nodal echelons   Severe nausea and vomiting    Status post chemotherapy    Only received 2 doses due to uncontrolled nausea and acute renal failure   Tonsillar cancer ([place]) 07/09/2013   SCCA of Right Tonsil, recurrent 2016   Vitamin B12 deficiency 01/07/2022    Current Outpatient Medications on File Prior to Visit  Medication Sig Dispense Refill   aspirin EC 81 MG tablet Take 81 mg by mouth daily.     cyanocobalamin (VITAMIN B12) 1000 MCG/ML injection Inject 1 mL (1,000 mcg total) into the muscle every 30 (thirty) days. (Patient taking differently: Inject 1,000 mcg into the muscle every 3 (three) months. Updated 04/21/22) 10 mL 0   dapagliflozin propanediol (FARXIGA) 10 MG TABS tablet Take 1 tablet (10 mg total) by mouth daily before breakfast. 90 tablet 3   isosorbide mononitrate (IMDUR) 30 MG 24 hr tablet Take 1 tablet (30 mg total) by mouth daily. 90 tablet 2   levothyroxine (SYNTHROID) 100 MCG tablet Take 1 tablet (100 mcg total) by mouth daily before breakfast. 30 tablet 5   metoprolol (TOPROL XL) 200 MG 24 hr tablet Take 1 tablet (200 mg total) by mouth daily. 90 tablet 3   omeprazole (PRILOSEC) 20 MG capsule Take 1 capsule (20 mg total) by mouth daily. 30 capsule 2   oxyCODONE (ROXICODONE) 15 MG immediate release tablet Take 1 tablet by mouth every 6 hours as needed. 90 tablet 0   sacubitril-valsartan (ENTRESTO) 49-51 MG Take 1 tablet by mouth 2 (two) times daily. 60 tablet 5   spironolactone (ALDACTONE) 25 MG tablet Take 0.5 tablets (12.5 mg total) by mouth daily. 45 tablet 3    Current Facility-Administered Medications on File Prior to Visit  Medication Dose Route Frequency Provider Last Rate Last Admin   sodium chloride 0.9 % injection 10 mL  10 mL Intravenous PRN Alvy Bimler, Ni, MD   10 mL at 11/29/16 0818   sodium chloride 0.9 % injection 10 mL  10 mL Intravenous PRN [name], MD        Allergies  Allergen Reactions   Bee Pollen     Watery eyes, runny nose, sneezing   Pollen Extract Other (See Comments)    Watery eyes, runny nose, sneezing  Assessment/Plan:  1. CHF with EF 35-40% - BP well controlled today with room to further optimize CHF regimen.   -Increase Toprol from '150mg'$  to '200mg'$  daily -Start spironolactone 12.'5mg'$  daily -Stop torsemide '10mg'$  daily and KCl 54mq daily -Continue Farxiga '10mg'$  daily, Entresto 49-'51mg'$  BID, and Imdur '30mg'$  daily  Repeat BP and BMET in 1 week. Pt to bring in home BP cuff to confirm accuracy. Can further increase spironolactone or Entresto pending results of BMET at that time. Will need echo rechecked 3 months after pt is on GDMT.  Cornesha Radziewicz E. Ritamarie Arkin, PharmD, BCACP, [place] [address] [place] [address]Phone: [phone] Fax: [phone]/26/2024 8:19 AM

## 2022-06-15 ENCOUNTER — Ambulatory Visit: Payer: Medicaid Other | Attending: Interventional Cardiology | Admitting: Pharmacist

## 2022-06-15 VITALS — BP 94/62 | HR 96 | Wt 287.0 lb

## 2022-06-15 DIAGNOSIS — I5022 Chronic systolic (congestive) heart failure: Secondary | ICD-10-CM | POA: Insufficient documentation

## 2022-06-16 LAB — BASIC METABOLIC PANEL
BUN/Creatinine Ratio: 6 — ABNORMAL LOW (ref 9–20)
BUN: 11 mg/dL (ref 6–24)
CO2: 21 mmol/L (ref 20–29)
Calcium: 9.3 mg/dL (ref 8.7–10.2)
Chloride: 98 mmol/L (ref 96–106)
Creatinine, Ser: 1.83 mg/dL — ABNORMAL HIGH (ref 0.76–1.27)
Glucose: 113 mg/dL — ABNORMAL HIGH (ref 70–99)
Potassium: 4.5 mmol/L (ref 3.5–5.2)
Sodium: 138 mmol/L (ref 134–144)
eGFR: 43 mL/min/{1.73_m2} — ABNORMAL LOW (ref >59)

## 2022-06-21 ENCOUNTER — Other Ambulatory Visit: Payer: Self-pay

## 2022-06-21 ENCOUNTER — Other Ambulatory Visit (HOSPITAL_COMMUNITY): Payer: Self-pay

## 2022-06-21 ENCOUNTER — Other Ambulatory Visit: Payer: Self-pay | Admitting: Hematology and Oncology

## 2022-06-21 MED ORDER — OXYCODONE HCL 15 MG PO TABS
15.0000 mg | ORAL_TABLET | Freq: Four times a day (QID) | ORAL | 0 refills | Status: DC | PRN
  Filled 2022-06-21: qty 90, 23d supply, fill #0

## 2022-06-22 ENCOUNTER — Telehealth: Payer: Self-pay | Admitting: Pharmacist

## 2022-06-22 DIAGNOSIS — I5022 Chronic systolic (congestive) heart failure: Secondary | ICD-10-CM

## 2022-06-22 NOTE — Telephone Encounter (Signed)
Spoke with pt fiance who states pt will go to [facility] later this week for labs.

## 2022-06-23 NOTE — Addendum Note (Signed)
Addended by: Marcelle Overlie D on: 06/23/2022 11:09 AM   Modules accepted: Orders

## 2022-06-28 ENCOUNTER — Ambulatory Visit: Payer: Medicaid Other | Attending: Internal Medicine

## 2022-06-28 DIAGNOSIS — I5022 Chronic systolic (congestive) heart failure: Secondary | ICD-10-CM

## 2022-06-29 LAB — BASIC METABOLIC PANEL
BUN/Creatinine Ratio: 7 — ABNORMAL LOW (ref 9–20)
BUN: 10 mg/dL (ref 6–24)
CO2: 22 mmol/L (ref 20–29)
Calcium: 9.8 mg/dL (ref 8.7–10.2)
Chloride: 97 mmol/L (ref 96–106)
Creatinine, Ser: 1.51 mg/dL — ABNORMAL HIGH (ref 0.76–1.27)
Glucose: 87 mg/dL (ref 70–99)
Potassium: 4.8 mmol/L (ref 3.5–5.2)
Sodium: 137 mmol/L (ref 134–144)
eGFR: 55 mL/min/{1.73_m2} — ABNORMAL LOW (ref >59)

## 2022-07-12 ENCOUNTER — Other Ambulatory Visit: Payer: Self-pay

## 2022-07-12 ENCOUNTER — Other Ambulatory Visit (HOSPITAL_COMMUNITY): Payer: Self-pay

## 2022-07-12 ENCOUNTER — Other Ambulatory Visit: Payer: Self-pay | Admitting: Hematology and Oncology

## 2022-07-12 MED ORDER — OXYCODONE HCL 15 MG PO TABS
15.0000 mg | ORAL_TABLET | Freq: Four times a day (QID) | ORAL | 0 refills | Status: DC | PRN
  Filled 2022-07-12 – 2022-07-13 (×2): qty 90, 23d supply, fill #0

## 2022-07-13 ENCOUNTER — Other Ambulatory Visit (HOSPITAL_COMMUNITY): Payer: Self-pay

## 2022-08-03 ENCOUNTER — Other Ambulatory Visit: Payer: Self-pay

## 2022-08-03 ENCOUNTER — Other Ambulatory Visit: Payer: Self-pay | Admitting: Physician Assistant

## 2022-08-03 ENCOUNTER — Other Ambulatory Visit (HOSPITAL_COMMUNITY): Payer: Self-pay

## 2022-08-03 MED ORDER — OXYCODONE HCL 15 MG PO TABS
15.0000 mg | ORAL_TABLET | Freq: Four times a day (QID) | ORAL | 0 refills | Status: DC | PRN
  Filled 2022-08-03: qty 90, 23d supply, fill #0

## 2022-08-03 NOTE — Telephone Encounter (Signed)
Refill sent to pharmacy per patient's request. I have reviewed the PDMP during this encounter.

## 2022-08-04 ENCOUNTER — Other Ambulatory Visit (HOSPITAL_COMMUNITY): Payer: Self-pay

## 2022-08-21 ENCOUNTER — Encounter: Payer: Self-pay | Admitting: Hematology and Oncology

## 2022-08-22 ENCOUNTER — Other Ambulatory Visit (HOSPITAL_COMMUNITY): Payer: Self-pay

## 2022-08-24 ENCOUNTER — Other Ambulatory Visit: Payer: Self-pay

## 2022-08-24 ENCOUNTER — Other Ambulatory Visit: Payer: Self-pay | Admitting: Physician Assistant

## 2022-08-24 ENCOUNTER — Other Ambulatory Visit (HOSPITAL_COMMUNITY): Payer: Self-pay

## 2022-08-24 MED ORDER — OXYCODONE HCL 15 MG PO TABS
15.0000 mg | ORAL_TABLET | Freq: Four times a day (QID) | ORAL | 0 refills | Status: DC | PRN
  Filled 2022-08-24: qty 90, 23d supply, fill #0

## 2022-08-24 NOTE — Telephone Encounter (Signed)
Refilled medication per patient's request. I have reviewed the PDMP during this encounter.

## 2022-08-25 ENCOUNTER — Other Ambulatory Visit (HOSPITAL_COMMUNITY): Payer: Self-pay

## 2022-08-30 ENCOUNTER — Other Ambulatory Visit: Payer: Self-pay

## 2022-08-30 ENCOUNTER — Other Ambulatory Visit (HOSPITAL_COMMUNITY): Payer: Self-pay

## 2022-09-06 ENCOUNTER — Inpatient Hospital Stay: Payer: Medicaid Other | Admitting: Hematology and Oncology

## 2022-09-13 ENCOUNTER — Other Ambulatory Visit: Payer: Self-pay | Admitting: Physician Assistant

## 2022-09-13 ENCOUNTER — Other Ambulatory Visit (HOSPITAL_COMMUNITY): Payer: Self-pay

## 2022-09-13 MED ORDER — OXYCODONE HCL 15 MG PO TABS
15.0000 mg | ORAL_TABLET | Freq: Four times a day (QID) | ORAL | 0 refills | Status: DC | PRN
  Filled 2022-09-13: qty 90, 23d supply, fill #0

## 2022-09-13 NOTE — Telephone Encounter (Signed)
I have reviewed the PDMP during this encounter. Refill sent to pharmacy as patient requested.

## 2022-10-01 ENCOUNTER — Other Ambulatory Visit: Payer: Self-pay | Admitting: Physician Assistant

## 2022-10-02 NOTE — Progress Notes (Addendum)
Office Visit    Patient Name: Mason [name] Date of Encounter: 10/03/2022  Primary Care Provider:  French Ana, FNP Primary Cardiologist:  Dietrich Pates, MD Primary Electrophysiologist: None   Past Medical History    Past Medical History:  Diagnosis Date   Abnormal liver function test    Acute sinusitis, unspecified 05/18/2015   Anemia    Anxiety    mild new dx   Arthritis    knees,hips   Bilateral edema of lower extremity    Complication of anesthesia    Pt stated " my oxygen level was slow in rising."   Concussion    Hx: in high school x 2   Constipation    Dysuria 09/04/2015   Fever    Hyperactive gag reflex    Hypertension    Hypoglycemia    Hypokalemia    Insomnia 06/15/2015   Knee pain, chronic    Malnutrition (HCC)    Non-healing surgical wound 05/23/2014   PEG (percutaneous endoscopic gastrostomy) status (HCC)    Renal failure, acute (HCC)    S/P radiation therapy 08/19/2013-10/15/2013   Right Tonstil and bilateral neck / 70 Gy in 35 fractions to gross disease, 63 Gy in 35 fractions to high risk nodal echelons, and 56 Gy in 35 fractions to intermediate risk nodal echelons   Severe nausea and vomiting    Status post chemotherapy    Only received 2 doses due to uncontrolled nausea and acute renal failure   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, recurrent 2016   Vitamin B12 deficiency 01/07/2022   Past Surgical History:  Procedure Laterality Date   IR REMOVAL TUN ACCESS W/ PORT W/O FL MOD SED  02/11/2022   LAPAROSCOPIC GASTROSTOMY N/A 08/15/2013   Procedure: LAPAROSCOPIC GASTROSTOMY TUBE PLACEMENT;  Surgeon: Axel Filler, MD;  Location: [facility] OR;  Service: General;  Laterality: N/A;   LYMPH NODE BIOPSY  03/20/14   right neck   MULTIPLE EXTRACTIONS WITH ALVEOLOPLASTY N/A 08/01/2013   Procedure: Extraction of tooth #'s 1,15,17,31, 32 with alveoloplasty, mandibular left torus reduction, and gross debridement of remaining teeth.;   Surgeon: Charlynne Pander, DDS;  Location: [facility] OR;  Service: Oral Surgery;  Laterality: N/A;   PORT-A-CATH REMOVAL N/A 05/13/2014   Procedure: REMOVAL of PORT-A-CATH;  Surgeon: Axel Filler, MD;  Location: [facility] OR;  Service: General;  Laterality: N/A;   PORTACATH PLACEMENT Left 08/15/2013   Procedure: INSERTION PORT-A-CATH;  Surgeon: Axel Filler, MD;  Location: [facility] OR;  Service: General;  Laterality: Left;    Allergies  Allergies  Allergen Reactions   Bee Pollen     Watery eyes, runny nose, sneezing   Pollen Extract Other (See Comments)    Watery eyes, runny nose, sneezing     History of Present Illness    Mason [name]  is a 57 year old male with a PMH of HFrEF, NICM, tonsillar CA s/p chemo and radiation now in remission, drug-induced pneumonitis, COPD, HTN, CKD stage III who presents today for follow-up and surgical clearance.  Mason [name] was seen initially by Dr.  Tenny Craw on 10/2017 for admission of   HFrEF after experiencing shortness of breath and dizziness to 4 weeks.  He was also noted to have lower extremity edema but denied any chest pain.  He underwent CTA of the chest that was negative for PE but noted 4 mm pulmonary nodule.  Troponins were found to be flat and 2D echo showed EF of 60%  with severely dilated diffuse hypokinesis with mild MR and mildly dilated LA with moderately reduced PA pressure and trivial pericardial effusion.  He was optimized on GDMT with Coreg and diuresis with Lasix.  He was seen by Dr. Excell Seltzer on 07/12/2018 with complaint of chest pain.  He described pain as left-sided subaxillary that was waxing and waning.  EKG was completed showing diffuse T wave abnormality.  He was seen in follow-up by Dr. Tenny Craw and reported resolution of chest pain.  He underwent a repeat 2D echo that showed EF of 25-30% and was restarted on hydralazine and Imdur and was referred to Dr. Graciela Husbands for consideration of ICD.  It was determined that further maximization of GDMT would be  necessary prior to consideration of ICD.  He was also recommended to undergo CPX to evaluate functional status. Patient was seen by [facility] cardiology on 11/23/21 per Care Everywhere and was noted to be on Coreg and Imdur with no hydralazine.  He was seen by Dr. Tenny Craw last on 12/27/2021.  During visit patient reported doing well with no complaints of chest pain or palpitations.  He was found to have elevated BP and was referred to the hypertension clinic for medication titration.  He was seen in the HTN clinic on 03/04/2022 and had Farxiga added to his regimen to maximize GDMT and Toprol was also increased to 100 mg.  He was seen in follow-up 03/2022 and Sherryll Burger was increased to 49/51 mg.  He was seen by Margaretmary Dys on 06/06/2022 for follow-up of medication titration.  He had further titration of GDMT with Toprol increased to 200 mg daily and spironolactone added at 12.5 mg daily.   Mason [name] presents today for overdue follow-up and surgical clearance with his significant other.  Since last being seen in the office patient reports that he has been doing well and has lost intentionally 15 pounds.  He is tolerating his current medication regimen and denies any adverse reactions.  His blood pressure today is well-controlled at 100/78 and heart rate is 70 bpm.  He is scheduled to have knee surgery in August and presents today for clearance visit.  He does report an isolated episode of chest pain that occurred while having intercourse.  He denies any recurrence of these episodes but does note that in the past it occurred with similar activity.  He is currently abstaining from alcohol but does have a history of EtOH abuse.  He has been abstaining from excess salt in his diet but still does admit to some sodium indiscretions due to food types.  During today's visit we discussed the importance of good blood pressure control and managing the progression of CHF. Patient denies chest pain, palpitations, dyspnea, PND, orthopnea,  nausea, vomiting, dizziness, syncope, edema, weight gain, or early satiety.  Home Medications    Current Outpatient Medications  Medication Sig Dispense Refill   Apremilast (OTEZLA) 30 MG TABS Take by mouth in the morning and at bedtime.     aspirin EC 81 MG tablet Take 81 mg by mouth daily.     cyanocobalamin (VITAMIN B12) 1000 MCG/ML injection Inject 1 mL (1,000 mcg total) into the muscle every 30 (thirty) days. (Patient taking differently: Inject 1,000 mcg into the muscle every 3 (three) months. Updated 04/21/22) 10 mL 0   dapagliflozin propanediol (FARXIGA) 10 MG TABS tablet Take 1 tablet (10 mg total) by mouth daily before breakfast. 90 tablet 3   isosorbide mononitrate (IMDUR) 30 MG 24 hr tablet Take 1 tablet (  30 mg total) by mouth daily. 90 tablet 2   levothyroxine (SYNTHROID) 100 MCG tablet Take 1 tablet (100 mcg total) by mouth daily before breakfast. 30 tablet 5   metoprolol (TOPROL XL) 200 MG 24 hr tablet Take 1 tablet (200 mg total) by mouth daily. 90 tablet 3   omeprazole (PRILOSEC) 20 MG capsule Take 1 capsule (20 mg total) by mouth daily. 30 capsule 2   oxyCODONE (ROXICODONE) 15 MG immediate release tablet Take 1 tablet by mouth every 6 hours as needed. 90 tablet 0   sacubitril-valsartan (ENTRESTO) 49-51 MG Take 1 tablet by mouth 2 (two) times daily. 60 tablet 5   spironolactone (ALDACTONE) 25 MG tablet Take 0.5 tablets (12.5 mg total) by mouth daily. 45 tablet 3   triamcinolone cream (KENALOG) 0.1 % Apply topically as needed (rash).     No current facility-administered medications for this visit.   Facility-Administered Medications Ordered in Other Visits  Medication Dose Route Frequency Provider Last Rate Last Admin   sodium chloride 0.9 % injection 10 mL  10 mL Intravenous PRN Bertis Ruddy, Ni, MD   10 mL at 11/29/16 0818   sodium chloride 0.9 % injection 10 mL  10 mL Intravenous PRN Artis Delay, MD         Review of Systems  Please see the history of present illness.    (+)  Trace lower extremity edema (+) Contusion cut to right eye  All other systems reviewed and are otherwise negative except as noted above.  Physical Exam    Wt Readings from Last 3 Encounters:  10/03/22 265 lb (120.2 kg)  06/15/22 287 lb (130.2 kg)  06/06/22 283 lb (128.4 kg)   VS: Vitals:   10/03/22 0904  BP: 100/78  Pulse: 70  SpO2: 99%  ,Body mass index is 31.42 kg/m.  Constitutional:      Appearance: Healthy appearance. Not in distress.  Neck:     Vascular: JVD normal.  Pulmonary:     Effort: Pulmonary effort is normal.     Breath sounds: No wheezing. No rales. Diminished in the bases Cardiovascular:     Normal rate. Regular rhythm. Normal S1. Normal S2.      Murmurs: There is no murmur.  Edema:    Peripheral edema absent.  Abdominal:     Palpations: Abdomen is soft non tender. There is no hepatomegaly.  Skin:    General: Skin is warm and dry.  Neurological:     General: No focal deficit present.     Mental Status: Alert and oriented to person, place and time.     Cranial Nerves: Cranial nerves are intact.  EKG/LABS/ Recent Cardiac Studies    ECG personally reviewed by me today -sinus rhythm with rate of 70 bpm and TWI in leads V4 and V5 consistent with previous EKG with no acute changes.  Cardiac Studies & Procedures       ECHOCARDIOGRAM  ECHOCARDIOGRAM COMPLETE 10/10/2018  Narrative ECHOCARDIOGRAM REPORT    Patient Name:   DEANE WATTENBARGER Date of Exam: 10/10/2018 Medical Rec #:  [id number]         Height:       79.0 in Accession #:    [id number]        Weight:       234.4 lb Date of Birth:  Sep 08, 1965         BSA:          2.44 m Patient Age:    53 years  BP:           138/89 mmHg Patient Gender: M                 HR:           77 bpm. Exam Location:  Outpatient   Procedure: 2D Echo  Indications:    systolic Heart Failure  History:        Patient has prior history of Echocardiogram examinations, most recent 06/07/2018. Risk Factors:  Hypertension.  Sonographer:    Thurman Coyer RDCS (AE) Referring Phys: [id number] PAULA V ROSS  IMPRESSIONS   1. The left ventricle has severely reduced systolic function, with an ejection fraction of 20-25%. The cavity size was severely dilated. Left ventricular diastolic Doppler parameters are consistent with impaired relaxation. Left ventricular diffuse hypokinesis. 2. The aortic valve is tricuspid. No stenosis of the aortic valve. 3. Severe global reduction in LV systolic function; mild diastolic dysfunction; severe LVE.  FINDINGS Left Ventricle: The left ventricle has severely reduced systolic function, with an ejection fraction of 20-25%. The cavity size was severely dilated. There is no increase in left ventricular wall thickness. Left ventricular diastolic Doppler parameters are consistent with impaired relaxation. Left ventricular diffuse hypokinesis.  Right Ventricle: The right ventricle has normal systolic function. The cavity was normal.  Left Atrium: Left atrial size was normal in size.  Right Atrium: Right atrial size was normal in size. Right atrial pressure is estimated at 10 mmHg.  Interatrial Septum: No atrial level shunt detected by color flow Doppler.  Pericardium: There is no evidence of pericardial effusion.  Mitral Valve: The mitral valve is normal in structure. Mitral valve regurgitation is not visualized by color flow Doppler.  Tricuspid Valve: The tricuspid valve is normal in structure. Tricuspid valve regurgitation is trivial by color flow Doppler.  Aortic Valve: The aortic valve is tricuspid Aortic valve regurgitation was not visualized by color flow Doppler. There is No stenosis of the aortic valve.  Pulmonic Valve: The pulmonic valve was not well visualized. Pulmonic valve regurgitation is not visualized by color flow Doppler.  Venous: The inferior vena cava is normal in size with greater than 50% respiratory variability.  Additional Comments: Severe  global reduction in LV systolic function; mild diastolic dysfunction; severe LVE.   +--------------+--------++ LEFT VENTRICLE         +----------------+---------++ +--------------+--------++ Diastology                PLAX 2D                +----------------+---------++ +--------------+--------++ LV e' lateral:  8.05 cm/s LVIDd:        6.60 cm  +----------------+---------++ +--------------+--------++ LV E/e' lateral:9.3       LVIDs:        5.70 cm  +----------------+---------++ +--------------+--------++ LV e' medial:   5.87 cm/s LV PW:        1.10 cm  +----------------+---------++ +--------------+--------++ LV E/e' medial: 12.7      LV IVS:       1.10 cm  +----------------+---------++ +--------------+--------++ LVOT diam:    2.30 cm  +--------------+--------++ LV SV:        64 ml    +--------------+--------++ LV SV Index:  26.04    +--------------+--------++ LVOT Area:    4.15 cm +--------------+--------++                        +--------------+--------++  +---------------+----------++ RIGHT VENTRICLE           +---------------+----------++  RV S prime:    13.60 cm/s +---------------+----------++ TAPSE (M-mode):1.9 cm     +---------------+----------++  +---------------+-------++-----------++ LEFT ATRIUM           Index       +---------------+-------++-----------++ LA diam:       4.60 cm1.89 cm/m  +---------------+-------++-----------++ LA Vol (A2C):  54.7 ml22.44 ml/m +---------------+-------++-----------++ LA Vol (A4C):  36.2 ml14.85 ml/m +---------------+-------++-----------++ LA Biplane Vol:49.0 ml20.10 ml/m +---------------+-------++-----------++ +------------+---------++-----------++ RIGHT ATRIUM         Index       +------------+---------++-----------++ RA Area:    15.70 cm            +------------+---------++-----------++ RA Volume:   40.30 ml 16.53 ml/m +------------+---------++-----------++ +------------+-----------++ AORTIC VALVE            +------------+-----------++ LVOT Vmax:  59.20 cm/s  +------------+-----------++ LVOT Vmean: 42.100 cm/s +------------+-----------++ LVOT VTI:   0.126 m     +------------+-----------++  +-------------+-------++ AORTA                +-------------+-------++ Ao Root diam:3.30 cm +-------------+-------++  +--------------+----------++ MITRAL VALVE             +--------------+-------+ +--------------+----------++ SHUNTS                MV Area (PHT):3.03 cm   +--------------+-------+ +--------------+----------++ Systemic VTI: 0.13 m  MV PHT:       72.50 msec +--------------+-------+ +--------------+----------++ Systemic Diam:2.30 cm MV Decel Time:250 msec   +--------------+-------+ +--------------+----------++ +--------------+----------++ MV E velocity:74.65 cm/s +--------------+----------++ MV A velocity:81.50 cm/s +--------------+----------++ MV E/A ratio: 0.92       +--------------+----------++   Olga Millers MD Electronically signed by Olga Millers MD Signature Date/Time: 10/10/2018/4:36:45 PM    Final                Lab Results  Component Value Date   WBC 6.0 04/21/2022   HGB 13.7 04/21/2022   HCT 41.8 04/21/2022   MCV 96.1 04/21/2022   PLT 338 04/21/2022   Lab Results  Component Value Date   CREATININE 1.51 (H) 06/28/2022   BUN 10 06/28/2022   NA 137 06/28/2022   K 4.8 06/28/2022   CL 97 06/28/2022   CO2 22 06/28/2022   Lab Results  Component Value Date   ALT 92 (H) 04/21/2022   AST 75 (H) 04/21/2022   ALKPHOS 46 04/21/2022   BILITOT 0.4 04/21/2022   No results found for: "CHOL", "HDL", "LDLCALC", "LDLDIRECT", "TRIG", "CHOLHDL"  Lab Results  Component Value Date   HGBA1C 5.7 08/09/2013     Assessment & Plan    1.  Preoperative clearance: -Patient's RCRI score  is 6.6%  The patient affirms he has been doing well without any new cardiac symptoms. They are able to achieve 6 METS without cardiac limitations. Therefore, based on ACC/AHA guidelines, the patient would be at acceptable risk for the planned procedure without further cardiovascular testing. The patient was advised that if he develops new symptoms prior to surgery to contact our office to arrange for a follow-up visit, and he verbalized understanding.  -Patient completed exercise Myoview that was intermediate risk and completed coronary CTA that showed minimal nonobstructive CAD.  Therefore he is cleared to proceed with scheduled surgical with no additional cardiac testing needed at this time.  2.  HFrEF/NICM: -Most recent 2D echo completed at [facility] with EF of 35-40% -Today patient is euvolemic on examination and denies any indiscretions with salt. -Continue GDMT with Aldactone 12.5 mg daily, Entresto 49/51 mg twice daily,  Toprol 200 mg daily, Farxiga 10 mg daily -We will recheck 2D echo to evaluate LV function. -Low sodium diet, fluid restriction <2L, and daily weights encouraged. Educated to contact our office for weight gain of 2 lbs overnight or 5 lbs in one week.   3.  Essential hypertension: -Patient was referred to Pharm.D. for BP titration. -Patient's blood pressure today was well-controlled at 100/78 -Continue Entresto 49/51 mg twice daily, Toprol 200 mg daily, to spironolactone 12.5 mg daily  4.  CKD stage III: -Patient's creatinine is 1.5  5.  History of tonsillar cancer: -Patient currently followed by Dr. Bertis Ruddy  6. Stable Angina -Patient reports episode of left-sided subpectoral chest pain that occurred while having intercourse -He denied any associated dizziness or palpitations with this discomfort. -We will have patient complete exercise Myoview prior to clearance being granted -Patient will have 0.4 mg Nitrostat as needed  Disposition: Follow-up with Dietrich Pates, MD or APP  in 4 months Informed Consent   Shared Decision Making/Informed Consent The risks [chest pain, shortness of breath, cardiac arrhythmias, dizziness, blood pressure fluctuations, myocardial infarction, stroke/transient ischemic attack, nausea, vomiting, allergic reaction, radiation exposure, metallic taste sensation and life-threatening complications (estimated to be 1 in 10,000)], benefits (risk stratification, diagnosing coronary artery disease, treatment guidance) and alternatives of a nuclear stress test were discussed in detail with Mason [name] and he agrees to proceed.      Medication Adjustments/Labs and Tests Ordered: Current medicines are reviewed at length with the patient today.  Concerns regarding medicines are outlined above.   Signed, Napoleon Form, Leodis Rains, NP 10/03/2022, 9:36 AM [facility]

## 2022-10-03 ENCOUNTER — Ambulatory Visit: Payer: Medicaid Other | Attending: Nurse Practitioner | Admitting: Nurse Practitioner

## 2022-10-03 ENCOUNTER — Encounter: Payer: Self-pay | Admitting: Nurse Practitioner

## 2022-10-03 ENCOUNTER — Other Ambulatory Visit (HOSPITAL_COMMUNITY): Payer: Self-pay

## 2022-10-03 ENCOUNTER — Other Ambulatory Visit: Payer: Self-pay

## 2022-10-03 VITALS — BP 100/78 | HR 70 | Ht 77.0 in | Wt 265.0 lb

## 2022-10-03 DIAGNOSIS — I10 Essential (primary) hypertension: Secondary | ICD-10-CM | POA: Diagnosis present

## 2022-10-03 DIAGNOSIS — Z01810 Encounter for preprocedural cardiovascular examination: Secondary | ICD-10-CM

## 2022-10-03 DIAGNOSIS — I5022 Chronic systolic (congestive) heart failure: Secondary | ICD-10-CM

## 2022-10-03 DIAGNOSIS — I2089 Other forms of angina pectoris: Secondary | ICD-10-CM | POA: Diagnosis present

## 2022-10-03 DIAGNOSIS — I428 Other cardiomyopathies: Secondary | ICD-10-CM | POA: Diagnosis present

## 2022-10-03 DIAGNOSIS — C099 Malignant neoplasm of tonsil, unspecified: Secondary | ICD-10-CM | POA: Diagnosis present

## 2022-10-03 MED ORDER — NITROGLYCERIN 0.4 MG SL SUBL
0.4000 mg | SUBLINGUAL_TABLET | SUBLINGUAL | 3 refills | Status: AC | PRN
  Filled 2022-10-03: qty 25, 7d supply, fill #0
  Filled 2023-09-07: qty 25, 7d supply, fill #1

## 2022-10-03 NOTE — Telephone Encounter (Signed)
Rx(s) sent to pharmacy electronically.  

## 2022-10-03 NOTE — Patient Instructions (Addendum)
Medication Instructions:  Your physician recommends that you continue on your current medications as directed. Please refer to the Current Medication list given to you today. *If you need a refill on your cardiac medications before your next appointment, please call your pharmacy*   Lab Work: None ordered   Testing/Procedures: Your physician has requested that you have an echocardiogram. Echocardiography is a painless test that uses sound waves to create images of your heart. It provides your doctor with information about the size and shape of your heart and how well your heart's chambers and valves are working. This procedure takes approximately one hour. There are no restrictions for this procedure. Please do NOT wear cologne, perfume, aftershave, or lotions (deodorant is allowed). Please arrive 15 minutes prior to your appointment time.  Your physician has requested that you have an exercise stress myoview. For further information please visit [email or web address] Please follow instruction sheet, as given.   Follow-Up: At [facility], you and your health needs are our priority.  As part of our continuing mission to provide you with exceptional heart care, we have created designated Provider Care Teams.  These Care Teams include your primary Cardiologist (physician) and Advanced Practice Providers (APPs -  Physician Assistants and Nurse Practitioners) who all work together to provide you with the care you need, when you need it.  We recommend signing up for the patient portal called "MyChart".  Sign up information is provided on this After Visit Summary.  MyChart is used to connect with patients for Virtual Visits (Telemedicine).  Patients are able to view lab/test results, encounter notes, upcoming appointments, etc.  Non-urgent messages can be sent to your provider as well.   To learn more about what you can do with MyChart, go to [email or web address].    Your next  appointment:   4 month(s)  Provider:   Dietrich Pates, MD  or Robin Searing, NP   Other Instructions Check your blood pressure daily for 2 weeks, then contact the office with your readings.  Please check your weight daily. Please contact the office if you gain more than 3lbs in a day or 5lbs in a week. Limit your salt intake to 1500-2000mg  per day or 500mg  of Sodium per meal.

## 2022-10-04 ENCOUNTER — Other Ambulatory Visit: Payer: Self-pay | Admitting: Hematology and Oncology

## 2022-10-04 ENCOUNTER — Other Ambulatory Visit (HOSPITAL_COMMUNITY): Payer: Self-pay

## 2022-10-04 MED ORDER — OXYCODONE HCL 15 MG PO TABS
15.0000 mg | ORAL_TABLET | Freq: Four times a day (QID) | ORAL | 0 refills | Status: DC | PRN
  Filled 2022-10-04: qty 90, 23d supply, fill #0

## 2022-10-21 ENCOUNTER — Inpatient Hospital Stay: Payer: Medicaid Other | Attending: Hematology and Oncology

## 2022-10-21 ENCOUNTER — Other Ambulatory Visit: Payer: Self-pay

## 2022-10-21 ENCOUNTER — Other Ambulatory Visit: Payer: Self-pay | Admitting: Hematology and Oncology

## 2022-10-21 ENCOUNTER — Encounter: Payer: Self-pay | Admitting: Hematology and Oncology

## 2022-10-21 ENCOUNTER — Other Ambulatory Visit (HOSPITAL_COMMUNITY): Payer: Self-pay

## 2022-10-21 ENCOUNTER — Inpatient Hospital Stay (HOSPITAL_BASED_OUTPATIENT_CLINIC_OR_DEPARTMENT_OTHER): Payer: Medicaid Other | Admitting: Hematology and Oncology

## 2022-10-21 VITALS — BP 104/70 | HR 74 | Temp 97.7°F | Resp 14 | Wt 264.7 lb

## 2022-10-21 DIAGNOSIS — I700 Atherosclerosis of aorta: Secondary | ICD-10-CM | POA: Diagnosis not present

## 2022-10-21 DIAGNOSIS — C099 Malignant neoplasm of tonsil, unspecified: Secondary | ICD-10-CM | POA: Insufficient documentation

## 2022-10-21 DIAGNOSIS — Z923 Personal history of irradiation: Secondary | ICD-10-CM | POA: Insufficient documentation

## 2022-10-21 DIAGNOSIS — G8929 Other chronic pain: Secondary | ICD-10-CM | POA: Diagnosis not present

## 2022-10-21 DIAGNOSIS — M436 Torticollis: Secondary | ICD-10-CM

## 2022-10-21 DIAGNOSIS — M25569 Pain in unspecified knee: Secondary | ICD-10-CM | POA: Insufficient documentation

## 2022-10-21 DIAGNOSIS — E039 Hypothyroidism, unspecified: Secondary | ICD-10-CM

## 2022-10-21 DIAGNOSIS — Z79899 Other long term (current) drug therapy: Secondary | ICD-10-CM | POA: Insufficient documentation

## 2022-10-21 DIAGNOSIS — J432 Centrilobular emphysema: Secondary | ICD-10-CM | POA: Diagnosis not present

## 2022-10-21 DIAGNOSIS — I2510 Atherosclerotic heart disease of native coronary artery without angina pectoris: Secondary | ICD-10-CM | POA: Insufficient documentation

## 2022-10-21 DIAGNOSIS — R070 Pain in throat: Secondary | ICD-10-CM

## 2022-10-21 DIAGNOSIS — R634 Abnormal weight loss: Secondary | ICD-10-CM | POA: Diagnosis not present

## 2022-10-21 DIAGNOSIS — E538 Deficiency of other specified B group vitamins: Secondary | ICD-10-CM

## 2022-10-21 LAB — CBC WITH DIFFERENTIAL/PLATELET
Abs Immature Granulocytes: 0.03 10*3/uL (ref 0.00–0.07)
Basophils Absolute: 0 10*3/uL (ref 0.0–0.1)
Basophils Relative: 0 %
Eosinophils Absolute: 0.1 10*3/uL (ref 0.0–0.5)
Eosinophils Relative: 1 %
HCT: 45.1 % (ref 39.0–52.0)
Hemoglobin: 14.5 g/dL (ref 13.0–17.0)
Immature Granulocytes: 1 %
Lymphocytes Relative: 15 %
Lymphs Abs: 0.8 10*3/uL (ref 0.7–4.0)
MCH: 31.9 pg (ref 26.0–34.0)
MCHC: 32.2 g/dL (ref 30.0–36.0)
MCV: 99.3 fL (ref 80.0–100.0)
Monocytes Absolute: 0.6 10*3/uL (ref 0.1–1.0)
Monocytes Relative: 11 %
Neutro Abs: 3.9 10*3/uL (ref 1.7–7.7)
Neutrophils Relative %: 72 %
Platelets: 186 10*3/uL (ref 150–400)
RBC: 4.54 MIL/uL (ref 4.22–5.81)
RDW: 14.1 % (ref 11.5–15.5)
WBC: 5.4 10*3/uL (ref 4.0–10.5)
nRBC: 0 % (ref 0.0–0.2)

## 2022-10-21 LAB — COMPREHENSIVE METABOLIC PANEL
ALT: 50 U/L — ABNORMAL HIGH (ref 0–44)
AST: 48 U/L — ABNORMAL HIGH (ref 15–41)
Albumin: 3.9 g/dL (ref 3.5–5.0)
Alkaline Phosphatase: 52 U/L (ref 38–126)
Anion gap: 6 (ref 5–15)
BUN: 17 mg/dL (ref 6–20)
CO2: 30 mmol/L (ref 22–32)
Calcium: 9.9 mg/dL (ref 8.9–10.3)
Chloride: 104 mmol/L (ref 98–111)
Creatinine, Ser: 1.92 mg/dL — ABNORMAL HIGH (ref 0.61–1.24)
GFR, Estimated: 41 mL/min — ABNORMAL LOW (ref >60)
Glucose, Bld: 80 mg/dL (ref 70–99)
Potassium: 5 mmol/L (ref 3.5–5.1)
Sodium: 140 mmol/L (ref 135–145)
Total Bilirubin: 0.7 mg/dL (ref 0.3–1.2)
Total Protein: 6.5 g/dL (ref 6.5–8.1)

## 2022-10-21 LAB — VITAMIN B12: Vitamin B-12: 324 pg/mL (ref 180–914)

## 2022-10-21 LAB — TSH: TSH: 3.496 u[IU]/mL (ref 0.350–4.500)

## 2022-10-21 MED ORDER — OXYCODONE HCL 15 MG PO TABS
15.0000 mg | ORAL_TABLET | Freq: Four times a day (QID) | ORAL | 0 refills | Status: DC | PRN
  Filled 2022-10-21 – 2022-10-25 (×5): qty 90, 23d supply, fill #0

## 2022-10-21 NOTE — Progress Notes (Signed)
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   All questions were answered. The patient knows to call the clinic with any problems, questions or concerns. The total time spent in the appointment was 25 minutes encounter with patients including review of chart and various tests results, discussions about plan of care and coordination of care plan   Artis Delay, MD 10/21/2022 4:05 PM  INTERVAL HISTORY: Please see below for problem oriented charting. he returns for surveillance follow-up He has lost weight due to changes in his diet His chronic pain is stable Is being evaluated by orthopedic surgeon for knee pain His skin rashes has resolved  REVIEW OF SYSTEMS:   Constitutional: Denies fevers, chills Eyes: Denies blurriness of vision Ears, nose, mouth, throat, and face: Denies mucositis or sore throat Respiratory: Denies cough, dyspnea or wheezes Cardiovascular: Denies palpitation, chest discomfort or lower extremity swelling Gastrointestinal:  Denies nausea, heartburn or change in  bowel habits Skin: Denies abnormal skin rashes Lymphatics: Denies new lymphadenopathy or easy bruising Neurological:Denies numbness, tingling or new weaknesses Behavioral/Psych: Mood is stable, no new changes  All other systems were reviewed with the patient and are negative.  I have reviewed the past medical history, past surgical history, social history and family history with the patient and they are unchanged from previous note.  ALLERGIES:  is allergic to bee pollen and pollen extract.  MEDICATIONS:  Current Outpatient Medications  Medication Sig Dispense Refill   cholecalciferol (VITAMIN D3) 25 MCG (1000 UNIT) tablet Take 2,000 Units by mouth daily.     Apremilast (OTEZLA) 30 MG TABS Take by mouth in the morning and at bedtime.     aspirin EC 81 MG tablet Take 81 mg by mouth daily.     cyanocobalamin (VITAMIN B12) 1000 MCG/ML injection Inject 1 mL (1,000 mcg total) into the muscle every 30 (thirty) days. (Patient taking differently: Inject 1,000 mcg into the muscle every 3 (three) months. Updated 04/21/22) 10 mL 0   dapagliflozin propanediol (FARXIGA) 10 MG TABS tablet Take 1 tablet (10 mg total) by mouth daily before breakfast. 90 tablet 3   isosorbide mononitrate (IMDUR) 30 MG 24 hr tablet Take 1 tablet (30 mg total) by mouth daily. 90 tablet 2   levothyroxine (SYNTHROID) 100 MCG tablet Take 1 tablet (100 mcg total) by mouth daily before breakfast. 30 tablet 5   metoprolol (TOPROL XL) 200 MG 24 hr tablet Take 1 tablet (200 mg total) by mouth daily. 90 tablet 3   nitroGLYCERIN (NITROSTAT) 0.4 MG SL tablet Place 1 tablet (0.4 mg total) under the tongue every 5 (five) minutes as needed. 25 tablet 3  omeprazole (PRILOSEC) 20 MG capsule Take 1 capsule (20 mg total) by mouth daily. 30 capsule 2   oxyCODONE (ROXICODONE) 15 MG immediate release tablet Take 1 tablet by mouth every 6 hours as needed. 90 tablet 0   sacubitril-valsartan (ENTRESTO) 49-51 MG Take 1 tablet by mouth 2 (two) times  daily. 60 tablet 5   spironolactone (ALDACTONE) 25 MG tablet Take 0.5 tablets (12.5 mg total) by mouth daily. 45 tablet 3   triamcinolone cream (KENALOG) 0.1 % Apply topically as needed (rash).     No current facility-administered medications for this visit.   Facility-Administered Medications Ordered in Other Visits  Medication Dose Route Frequency Provider Last Rate Last Admin   sodium chloride 0.9 % injection 10 mL  10 mL Intravenous PRN Bertis Ruddy, Nijae Doyel, MD   10 mL at 11/29/16 0818   sodium chloride 0.9 % injection 10 mL  10 mL Intravenous PRN Artis Delay, MD        SUMMARY OF ONCOLOGIC HISTORY: Oncology History Overview Note  Tonsillar cancer, HPV positive   Primary site: Pharynx - Oropharynx (Right)   Staging method: AJCC 7th Edition   Clinical: Stage IVA (T2, N2b, M0) signed by Artis Delay, MD on 08/19/2013  1:24 PM   Summary: Stage IVA (T2, N2b, M0)     Tonsillar cancer (HCC)  07/09/2013 Procedure   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 FNA of right level III lymph node was inconclusive for cancer   07/25/2013 Imaging   PET scan showed locally advanced disease with abnormal lymphadenopathy in the right axilla   08/06/2013 Procedure   CT-guided biopsy of the lymphadenopathy was negative for malignancy   08/15/2013 Surgery   Patient has placement of port and feeding tube   08/19/2013 - 09/10/2013 Chemotherapy   Patient received chemotherapy with cisplatin. The patient only received 2 doses due to uncontrolled nausea and acute renal failure.   08/19/2013 - 10/15/2013 Radiation Therapy   Received Helical IMRT Tomotherapy:  Right Tonstil and bilateral neck / 70 Gy in 35 fractions to gross disease, 63 Gy in 35 fractions to high risk nodal echelons, and 56 Gy in 35 fractions to intermediate risk nodal echelons.   08/27/2013 - 08/30/2013 Hospital Admission   The patient was admitted to the hospital for uncontrolled nausea vomiting and  dehydration.   02/14/2014 Imaging   PET/CT scan showed complete response to treatment   03/19/2014 Surgery   He had excisional lymph node biopsy from the right neck. Pathology was benign   05/13/2014 Surgery   He had removal of Port-A-Cath.   05/22/2014 Imaging   Repeat CT scan of the neck show no evidence of disease recurrence.   12/09/2014 Imaging   Ct neck without contrast showed persistent abnormalities on the right side of neck, indeterminate   12/25/2014 Imaging   PET CT scan showed disease recurrence.   02/10/2015 Procedure   He has placement of port   02/13/2015 - 07/13/2015 Chemotherapy   He received chemotherapy with carbo/Taxol   04/21/2015 Imaging   PET CT scan showed improved disease control   08/04/2015 Imaging   PET scan showed persistent disease   08/17/2015 -  Chemotherapy   He was started with [facility]   10/19/2015 Imaging   Ct neck showed mass-like intermediate density soft tissue at the right lateral neck recurrence site stable.    02/26/2016 Imaging   CT neck showed unchanged appearance of right neck recurrence compared to 10/19/2015 CT. No noncontrast evidence  of new metastatic disease in the neck. Chronic sinusitis, progressed.   02/26/2016 Imaging   Diffuse but patchy and asymmetric partial airspace filling process (ground-glass opacity) in the lungs. This could be due to respiratory bronchiolitis, hypersensitivity pneumonitis or possible drug reaction. Atypical/viral pneumonia is also possible. Pulmonary consultation may be a helpful. A three-month follow-up noncontrast chest CT is suggested. Slight interval enlargement of mediastinal lymph nodes and a small lymph node along the left major fissure. This is most likely due to the inflammatory process in the lungs. No findings for metastatic disease involving the chest. No findings for upper abdominal metastatic disease.   02/29/2016 Adverse Reaction   His treatment is placed on hold due to possible  hypersensitivity pneumonitis/drug reaction   04/14/2016 Imaging   Ct chest showed no evidence for metastatic disease within the chest. Significant interval improvement and near complete resolution of previously described diffuse bilateral predominately ground-glass pulmonary opacities, most compatible with resolving infectious/inflammatory process.   09/05/2016 Imaging   CT scan of neck and chest  1. Unchanged appearance of right neck recurrence. 2. No evidence of new metastatic disease in the neck. 3. Unremarkable and stable CT appearance of the chest. No findings suspicious for metastatic disease   07/24/2017 Imaging   1. No findings suspicious for metastatic disease in the chest. 2. No acute consolidative airspace disease to suggest a pneumonia. 3. No appreciable change in chronic mild patchy upper lung predominant centrilobular micronodularity. If the patient is a current smoker, these findings are most compatible with smoking related interstitial lung disease. If the patient is not a current smoker, these findings suggest subacute hypersensitivity pneumonitis  or postinflammatory change. 4. Stable mild biapical radiation fibrosis. 5. Mild to moderate centrilobular emphysema with diffuse bronchial wall thickening, suggesting COPD. 6. One vessel coronary atherosclerosis.  Aortic Atherosclerosis (ICD10-I70.0) and Emphysema (ICD10-J43.9).   09/15/2017 Imaging   1. No evidence of interstitial lung disease. 2.  Emphysema (ICD10-J43.9).   10/29/2017 Imaging   1. Moderate quality exam for pulmonary embolism with primary limitation of motion. No embolism identified. 2. Findings of congestive heart failure, including bilateral pleural effusions and septal thickening. 3. New thoracic adenopathy since approximately 6 weeks ago. Favor secondary to fluid overload/congestive heart failure. 4. New left apical 4 mm pulmonary nodule. Favored to represent a subpleural lymph node. Non-contrast chest CT can  be considered in 12 months, given risk factors for primary bronchogenic carcinoma. This recommendation follows the consensus statement: Guidelines for Management of Incidental Pulmonary Nodules Detected on CT Images: From the Fleischner Society 2017; Radiology 2017; 409:811-914.     10/29/2017 - 11/02/2017 Hospital Admission   He was admitted to the hospital due to shortness of breath and was found to have congestive heart failure.   10/30/2017 Imaging   Definity used; severe global reduction in LV systolic function; severe LVE; restrictive filling; mild MR; mild LAE; mild RVE with moderate RV dysfunction; mild TR with moderate pulmonary hypertension.   01/26/2018 Imaging   1. Regression of soft tissue in the postoperative right neck favoring scarring. No new or progressive finding to suggest recurrent disease. 2. Chest CT reported separately   01/26/2018 Imaging   1. No evidence of thoracic metastasis. 2.  Port in the anterior chest wall with tip in distal    03/12/2018 Imaging   1. Trace, silent aspiration noted after a swallow consistent with aspiration of residual barium in the hypopharynx. 2. Retrograde reflux of contrast into the posterior nasopharynx with swallowing. 3. No gross esophageal  abnormality 4. Initial difficulty swallowing a 13 mm barium tablet that than passes readily into the stomach once it is swallowed.   05/31/2018 Imaging   Resolution of previously seen mucosal hyperenhancement in the pharynx and larynx. Otherwise unchanged examination of the neck without evidence of recurrent disease or cervical nodal metastases.     06/07/2018 Echocardiogram   1. The left ventricle has moderately reduced systolic function, with an ejection fraction of 35-40%. The cavity size was severely dilated. Left ventricular diastolic Doppler parameters are consistent with impaired relaxation Left ventricular diffuse hypokinesis.  2. The right ventricle has normal systolic function. The cavity  was moderately enlarged. There is no increase in right ventricular wall thickness.  3. The mitral valve is normal in structure.  4. The tricuspid valve is normal in structure.  5. The aortic valve is normal in structure.  6. The pulmonic valve was normal in structure.  7. The inferior vena cava was dilated in size with >50% respiratory variability.   02/24/2020 Imaging   Ct neck Postsurgical changes of the right side of the neck. No evidence of recurrent disease. No lymphadenopathy   02/24/2020 Imaging   1. No evidence of metastatic disease in the chest, abdomen, or pelvis. 2. Innumerable tiny centrilobular pulmonary nodules, most concentrated in the upper lobes, likely reflecting smoking-related respiratory bronchiolitis. 3. Hepatic steatosis. 4. Aortic Atherosclerosis (ICD10-I70.0).     10/01/2021 Imaging   CT neck  The visualized portions of the brain and the posterior fossa are normal.   Mucosal thickening of the right maxillary sinus. The orbits are normal. Trace secretions in the nasopharynx. The nasal cavity and nasopharynx are otherwise unremarkable.   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 Postsurgical soft tissue thickening on the right side of the neck along the posterior triangle, carotid sheath, and parapharyngeal/mucosal pharyngeal spaces. No new or enlarging enhancing mass seen. Mild asymmetric atrophy along the right tonsillar fossa, likely from treatment of the prior mass. No discrete lesion in the oropharynx. The oropharynx and oral cavity are otherwise unremarkable. The remaining parapharyngeal spaces are clear.   Mild atrophy of the submandibular glands. The parotid glands appear normal.   The larynx and hypopharynx are normal.   There is no lymphadenopathy.   The thyroid gland is normal.   No acute osseous abnormality. No lytic or blastic osseous lesions. Degenerative changes of the spine.   CT chest  No evidence for intrathoracic  metastatic disease.   Mild diffuse bronchial wall thickening with scattered centrilobular groundglass nodules in bilateral upper lobes, may represent underlying respiratory bronchiolitis.    11/23/2021 Echocardiogram   Summary   1. Technically difficult study.    2. The left ventricle is upper normal in size with normal wall thickness.    3. The left ventricular systolic function is moderately to severely  decreased, LVEF is visually estimated at 35-40%.    4. There is grade I diastolic dysfunction (impaired relaxation).    5. The right ventricle is normal in size, with normal systolic function.    01/20/2022 Imaging   1. Stable emphysematous changes but no acute overlying pulmonary process or worrisome pulmonary lesions to suggest metastatic disease. 2. No mediastinal or hilar mass or adenopathy. 3. Diffuse fatty infiltration of the liver.   Emphysema (ICD10-J43.9).   01/20/2022 Imaging   Within the limitations of a non-contrast enhanced exam,   1. Postsurgical changes in the right neck without evidence of recurrent disease. There is persistent soft tissue thickening  in the right level 2A lymph node station, which grossly appears unchanged compared to 02/24/2020 CT of the neck. 2. No new lymphadenopathy.     02/11/2022 Procedure   Successful removal of the right chest Port-A-Cath.       PHYSICAL EXAMINATION: ECOG PERFORMANCE STATUS: 1 - Symptomatic but completely ambulatory  Vitals:   10/21/22 1240  BP: 104/70  Pulse: 74  Resp: 14  Temp: 97.7 F (36.5 C)  SpO2: 96%   Filed Weights   10/21/22 1240  Weight: 264 lb 11.2 oz (120.1 kg)    GENERAL:alert, no distress and comfortable SKIN: skin color, texture, turgor are normal, no rashes or significant lesions EYES: normal, Conjunctiva are pink and non-injected, sclera clear OROPHARYNX:no exudate, no erythema and lips, buccal mucosa, and tongue normal  NECK: Noted radiation-induced fibrosis on the right side of his  neck LYMPH:  no palpable lymphadenopathy in the cervical, axillary or inguinal LUNGS: clear to auscultation and percussion with normal breathing effort HEART: regular rate & rhythm and no murmurs and no lower extremity edema ABDOMEN:abdomen soft, non-tender and normal bowel sounds Musculoskeletal:no cyanosis of digits and no clubbing  NEURO: alert & oriented x 3 with fluent speech, no focal motor/sensory deficits  LABORATORY DATA:  I have reviewed the data as listed    Component Value Date/Time   NA 140 10/21/2022 1230   NA 137 06/28/2022 1258   NA 141 04/20/2017 1028   K 5.0 10/21/2022 1230   K 4.3 04/20/2017 1028   CL 104 10/21/2022 1230   CO2 30 10/21/2022 1230   CO2 26 04/20/2017 1028   GLUCOSE 80 10/21/2022 1230   GLUCOSE 96 04/20/2017 1028   BUN 17 10/21/2022 1230   BUN 10 06/28/2022 1258   BUN 16.7 04/20/2017 1028   CREATININE 1.92 (H) 10/21/2022 1230   CREATININE 1.65 (H) 01/06/2022 1408   CREATININE 2.0 (H) 04/20/2017 1028   CALCIUM 9.9 10/21/2022 1230   CALCIUM 9.0 04/20/2017 1028   PROT 6.5 10/21/2022 1230   PROT 7.0 04/20/2017 1028   ALBUMIN 3.9 10/21/2022 1230   ALBUMIN 4.0 04/20/2017 1028   AST 48 (H) 10/21/2022 1230   AST 35 01/06/2022 1408   AST 20 04/20/2017 1028   ALT 50 (H) 10/21/2022 1230   ALT 32 01/06/2022 1408   ALT 17 04/20/2017 1028   ALKPHOS 52 10/21/2022 1230   ALKPHOS 61 04/20/2017 1028   BILITOT 0.7 10/21/2022 1230   BILITOT 0.6 01/06/2022 1408   BILITOT 0.49 04/20/2017 1028   GFRNONAA 41 (L) 10/21/2022 1230   GFRNONAA 49 (L) 01/06/2022 1408   GFRAA 58 (L) 01/01/2020 1127    No results found for: "SPEP", "UPEP"  Lab Results  Component Value Date   WBC 5.4 10/21/2022   NEUTROABS 3.9 10/21/2022   HGB 14.5 10/21/2022   HCT 45.1 10/21/2022   MCV 99.3 10/21/2022   PLT 186 10/21/2022      Chemistry      Component Value Date/Time   NA 140 10/21/2022 1230   NA 137 06/28/2022 1258   NA 141 04/20/2017 1028   K 5.0 10/21/2022 1230    K 4.3 04/20/2017 1028   CL 104 10/21/2022 1230   CO2 30 10/21/2022 1230   CO2 26 04/20/2017 1028   BUN 17 10/21/2022 1230   BUN 10 06/28/2022 1258   BUN 16.7 04/20/2017 1028   CREATININE 1.92 (H) 10/21/2022 1230   CREATININE 1.65 (H) 01/06/2022 1408   CREATININE 2.0 (H) 04/20/2017 1028  Component Value Date/Time   CALCIUM 9.9 10/21/2022 1230   CALCIUM 9.0 04/20/2017 1028   ALKPHOS 52 10/21/2022 1230   ALKPHOS 61 04/20/2017 1028   AST 48 (H) 10/21/2022 1230   AST 35 01/06/2022 1408   AST 20 04/20/2017 1028   ALT 50 (H) 10/21/2022 1230   ALT 32 01/06/2022 1408   ALT 17 04/20/2017 1028   BILITOT 0.7 10/21/2022 1230   BILITOT 0.6 01/06/2022 1408   BILITOT 0.49 04/20/2017 1028

## 2022-10-21 NOTE — Assessment & Plan Note (Signed)
His recent imaging studies in 2023 showed no signs of cancer recurrence I plan to see him again in 6 months for further follow-up He is a long-term cancer survivor and does not need surveillance imaging

## 2022-10-21 NOTE — Assessment & Plan Note (Signed)
He has chronic throat pain related to prior treatment I refilled his prescription pain medicine today 

## 2022-10-21 NOTE — Assessment & Plan Note (Signed)
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

## 2022-10-24 ENCOUNTER — Telehealth: Payer: Self-pay | Admitting: Hematology and Oncology

## 2022-10-24 ENCOUNTER — Other Ambulatory Visit (HOSPITAL_COMMUNITY): Payer: Self-pay

## 2022-10-24 ENCOUNTER — Telehealth: Payer: Self-pay

## 2022-10-24 ENCOUNTER — Encounter: Payer: Self-pay | Admitting: Hematology and Oncology

## 2022-10-24 NOTE — Telephone Encounter (Signed)
-----   Message from Malissa Hippo, RN sent at 10/24/2022  2:12 PM EDT -----  ----- Message ----- From: Artis Delay, MD Sent: 10/24/2022   8:47 AM EDT To: Malissa Hippo, RN  Please call wife/patient Labs from last week showed elevated creatinine worse than baseline, advise increase oral fluids LFT is due to fatty liver, improved compared to before

## 2022-10-24 NOTE — Telephone Encounter (Signed)
Called patient regarding upcoming visit received no answer. Unable to leave vm mailbox full

## 2022-10-24 NOTE — Telephone Encounter (Signed)
Left message with Erasmo Downer, significant other, to Mr. Squyres and relayed the message about Mr. Cuttler labs. Advised that he increase oral intake due to increased creatinine above baseline.  Also advised that LFT is due to fatty liver although that has improved. Lorayne Marek, RN

## 2022-10-25 ENCOUNTER — Other Ambulatory Visit (HOSPITAL_COMMUNITY): Payer: Self-pay

## 2022-10-25 ENCOUNTER — Other Ambulatory Visit: Payer: Self-pay

## 2022-10-25 ENCOUNTER — Other Ambulatory Visit: Payer: Self-pay | Admitting: Hematology and Oncology

## 2022-10-25 MED ORDER — LEVOTHYROXINE SODIUM 100 MCG PO TABS
100.0000 ug | ORAL_TABLET | Freq: Every day | ORAL | 5 refills | Status: DC
  Filled 2022-10-25: qty 30, 30d supply, fill #0
  Filled 2022-11-14 – 2022-11-16 (×2): qty 30, 30d supply, fill #1
  Filled 2022-12-03 – 2022-12-26 (×3): qty 30, 30d supply, fill #2
  Filled 2023-01-11 – 2023-01-30 (×4): qty 30, 30d supply, fill #3
  Filled 2023-02-21: qty 30, 30d supply, fill #4
  Filled 2023-03-17: qty 30, 30d supply, fill #5

## 2022-11-01 ENCOUNTER — Encounter (HOSPITAL_COMMUNITY): Payer: Self-pay

## 2022-11-03 ENCOUNTER — Ambulatory Visit (HOSPITAL_BASED_OUTPATIENT_CLINIC_OR_DEPARTMENT_OTHER): Payer: Medicaid Other

## 2022-11-03 ENCOUNTER — Ambulatory Visit (HOSPITAL_COMMUNITY): Payer: Medicaid Other | Attending: Nurse Practitioner

## 2022-11-03 DIAGNOSIS — Z01810 Encounter for preprocedural cardiovascular examination: Secondary | ICD-10-CM

## 2022-11-03 DIAGNOSIS — I428 Other cardiomyopathies: Secondary | ICD-10-CM | POA: Diagnosis not present

## 2022-11-03 DIAGNOSIS — C099 Malignant neoplasm of tonsil, unspecified: Secondary | ICD-10-CM

## 2022-11-03 DIAGNOSIS — I5022 Chronic systolic (congestive) heart failure: Secondary | ICD-10-CM

## 2022-11-03 DIAGNOSIS — I10 Essential (primary) hypertension: Secondary | ICD-10-CM

## 2022-11-03 LAB — ECHOCARDIOGRAM COMPLETE
Area-P 1/2: 3.92 cm2
S' Lateral: 2.6 cm

## 2022-11-03 LAB — MYOCARDIAL PERFUSION IMAGING: Rest Nuclear Isotope Dose: 10.8 mCi

## 2022-11-03 MED ORDER — TECHNETIUM TC 99M TETROFOSMIN IV KIT
10.8000 | PACK | Freq: Once | INTRAVENOUS | Status: AC | PRN
  Administered 2022-11-03: 10.8 via INTRAVENOUS

## 2022-11-03 MED ORDER — PERFLUTREN LIPID MICROSPHERE
1.0000 mL | INTRAVENOUS | Status: AC | PRN
Start: 2022-11-03 — End: 2022-11-03
  Administered 2022-11-03: 2 mL via INTRAVENOUS

## 2022-11-04 ENCOUNTER — Ambulatory Visit (HOSPITAL_COMMUNITY): Payer: Medicaid Other

## 2022-11-07 ENCOUNTER — Ambulatory Visit (HOSPITAL_COMMUNITY): Payer: Medicaid Other

## 2022-11-08 ENCOUNTER — Ambulatory Visit (HOSPITAL_COMMUNITY): Payer: Medicaid Other | Attending: Cardiology

## 2022-11-08 DIAGNOSIS — I10 Essential (primary) hypertension: Secondary | ICD-10-CM | POA: Insufficient documentation

## 2022-11-08 DIAGNOSIS — C099 Malignant neoplasm of tonsil, unspecified: Secondary | ICD-10-CM | POA: Diagnosis present

## 2022-11-08 DIAGNOSIS — I428 Other cardiomyopathies: Secondary | ICD-10-CM | POA: Insufficient documentation

## 2022-11-08 DIAGNOSIS — I5022 Chronic systolic (congestive) heart failure: Secondary | ICD-10-CM | POA: Insufficient documentation

## 2022-11-08 DIAGNOSIS — Z01810 Encounter for preprocedural cardiovascular examination: Secondary | ICD-10-CM | POA: Insufficient documentation

## 2022-11-08 LAB — MYOCARDIAL PERFUSION IMAGING
LV dias vol: 76 mL (ref 62–150)
LV sys vol: 45 mL
Nuc Stress EF: 41 %
Peak HR: 134 {beats}/min
Rest HR: 94 {beats}/min
SDS: 1
SRS: 5
SSS: 5
ST Depression (mm): 0 mm
Stress Nuclear Isotope Dose: 31.5 mCi
TID: 0.99

## 2022-11-08 MED ORDER — TECHNETIUM TC 99M TETROFOSMIN IV KIT
31.5000 | PACK | Freq: Once | INTRAVENOUS | Status: AC | PRN
  Administered 2022-11-08: 31.5 via INTRAVENOUS

## 2022-11-08 MED ORDER — TECHNETIUM TC 99M TETROFOSMIN IV KIT
10.8000 | PACK | Freq: Once | INTRAVENOUS | Status: AC | PRN
  Administered 2022-11-03: 10.8 via INTRAVENOUS

## 2022-11-08 MED ORDER — AMINOPHYLLINE 25 MG/ML IV SOLN
75.0000 mg | Freq: Once | INTRAVENOUS | Status: AC
Start: 2022-11-08 — End: 2022-11-08
  Administered 2022-11-08: 75 mg via INTRAVENOUS

## 2022-11-08 MED ORDER — REGADENOSON 0.4 MG/5ML IV SOLN
0.4000 mg | Freq: Once | INTRAVENOUS | Status: AC
Start: 2022-11-08 — End: 2022-11-08
  Administered 2022-11-08: 0.4 mg via INTRAVENOUS

## 2022-11-11 ENCOUNTER — Telehealth: Payer: Self-pay

## 2022-11-11 DIAGNOSIS — R072 Precordial pain: Secondary | ICD-10-CM

## 2022-11-11 NOTE — Telephone Encounter (Signed)
The patient has been notified of the result and verbalized understanding.  All questions (if any) were answered. Frutoso Schatz, RN 11/11/2022 10:22 AM  Labs and CT have been ordered.

## 2022-11-11 NOTE — Telephone Encounter (Signed)
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 The test overall indicates intermediate risk and due to your current symptoms will warrant additional testing prior to surgical clearance being granted.  Plan: -Please order BMET -Please order coronary CTA to evaluate coronary blood flow -Please advise patient to use as needed Nitrostat and go to the ED if chest pain returns and is refractory to Nitrostat.   Robin Searing, NP

## 2022-11-14 ENCOUNTER — Encounter (HOSPITAL_COMMUNITY): Payer: Self-pay | Admitting: Pharmacist

## 2022-11-14 ENCOUNTER — Other Ambulatory Visit: Payer: Self-pay | Admitting: Internal Medicine

## 2022-11-14 ENCOUNTER — Other Ambulatory Visit: Payer: Self-pay | Admitting: Hematology and Oncology

## 2022-11-14 ENCOUNTER — Other Ambulatory Visit (HOSPITAL_COMMUNITY): Payer: Self-pay

## 2022-11-15 ENCOUNTER — Other Ambulatory Visit (HOSPITAL_COMMUNITY): Payer: Self-pay

## 2022-11-15 ENCOUNTER — Other Ambulatory Visit: Payer: Self-pay

## 2022-11-15 MED ORDER — OXYCODONE HCL 15 MG PO TABS
15.0000 mg | ORAL_TABLET | Freq: Four times a day (QID) | ORAL | 0 refills | Status: DC | PRN
  Filled 2022-11-15: qty 90, 23d supply, fill #0

## 2022-11-16 ENCOUNTER — Other Ambulatory Visit (HOSPITAL_COMMUNITY): Payer: Self-pay

## 2022-11-16 ENCOUNTER — Telehealth (HOSPITAL_COMMUNITY): Payer: Self-pay | Admitting: *Deleted

## 2022-11-16 MED ORDER — ENTRESTO 49-51 MG PO TABS
1.0000 | ORAL_TABLET | Freq: Two times a day (BID) | ORAL | 5 refills | Status: DC
  Filled 2022-11-16: qty 60, 30d supply, fill #0
  Filled 2022-12-03 – 2022-12-26 (×3): qty 60, 30d supply, fill #1
  Filled 2023-01-11 – 2023-01-30 (×4): qty 60, 30d supply, fill #2
  Filled 2023-02-21: qty 60, 30d supply, fill #3
  Filled 2023-03-17: qty 60, 30d supply, fill #4
  Filled 2023-04-09: qty 60, 30d supply, fill #5

## 2022-11-16 NOTE — Telephone Encounter (Signed)
Reaching out to patient to offer assistance regarding upcoming cardiac imaging study; pt verbalizes understanding of appt date/time, parking situation and where to check in, pre-test NPO status and verified current allergies; name and call back number provided for further questions should they arise  Larey Brick RN Navigator Cardiac Imaging [facility] [phone] office [phone] cell  Patient to hold AM dose of Entresto and is aware to arrive at 12 PM.

## 2022-11-17 ENCOUNTER — Other Ambulatory Visit (HOSPITAL_COMMUNITY): Payer: Self-pay

## 2022-11-17 ENCOUNTER — Encounter (HOSPITAL_COMMUNITY): Payer: Self-pay

## 2022-11-17 ENCOUNTER — Ambulatory Visit (HOSPITAL_COMMUNITY)
Admission: RE | Admit: 2022-11-17 | Discharge: 2022-11-17 | Disposition: A | Discharge status: Home / Self Care | Payer: Medicaid Other | Source: Ambulatory Visit | Attending: Nurse Practitioner | Admitting: Nurse Practitioner

## 2022-11-17 DIAGNOSIS — R072 Precordial pain: Secondary | ICD-10-CM | POA: Insufficient documentation

## 2022-11-17 MED ORDER — IOHEXOL 350 MG/ML SOLN
95.0000 mL | Freq: Once | INTRAVENOUS | Status: AC | PRN
  Administered 2022-11-17: 95 mL via INTRAVENOUS

## 2022-11-17 MED ORDER — NITROGLYCERIN 0.4 MG SL SUBL
0.8000 mg | SUBLINGUAL_TABLET | Freq: Once | SUBLINGUAL | Status: AC
  Administered 2022-11-17: 0.8 mg via SUBLINGUAL

## 2022-11-17 MED ORDER — NITROGLYCERIN 0.4 MG SL SUBL
SUBLINGUAL_TABLET | SUBLINGUAL | Status: AC
  Filled 2022-11-17: qty 2

## 2022-11-18 ENCOUNTER — Other Ambulatory Visit: Payer: Self-pay

## 2022-11-18 DIAGNOSIS — R7989 Other specified abnormal findings of blood chemistry: Secondary | ICD-10-CM

## 2022-11-18 DIAGNOSIS — E782 Mixed hyperlipidemia: Secondary | ICD-10-CM

## 2022-12-03 ENCOUNTER — Other Ambulatory Visit: Payer: Self-pay

## 2022-12-03 ENCOUNTER — Encounter: Payer: Self-pay | Admitting: Internal Medicine

## 2022-12-03 ENCOUNTER — Encounter: Payer: Self-pay | Admitting: Hematology and Oncology

## 2022-12-03 ENCOUNTER — Other Ambulatory Visit (HOSPITAL_COMMUNITY): Payer: Self-pay

## 2022-12-03 ENCOUNTER — Other Ambulatory Visit: Payer: Self-pay | Admitting: Hematology and Oncology

## 2022-12-05 ENCOUNTER — Other Ambulatory Visit (HOSPITAL_COMMUNITY): Payer: Self-pay

## 2022-12-05 ENCOUNTER — Telehealth: Payer: Self-pay | Admitting: *Deleted

## 2022-12-05 ENCOUNTER — Other Ambulatory Visit: Payer: Self-pay

## 2022-12-05 MED ORDER — OXYCODONE HCL 15 MG PO TABS
15.0000 mg | ORAL_TABLET | Freq: Four times a day (QID) | ORAL | 0 refills | Status: DC | PRN
  Filled 2022-12-05: qty 90, 23d supply, fill #0

## 2022-12-05 NOTE — Telephone Encounter (Signed)
   Pre-operative Risk Assessment    Patient Name: Mason [name]  DOB: 1965-12-06 MRN: [id number]      Request for Surgical Clearance    Procedure:   TKA  Date of Surgery:  Clearance TBD                                 Surgeon:  DR. Georgena Spurling Surgeon's Group or Practice Name:  [facility] Phone number:  [phone] Fax number:  [phone]   Type of Clearance Requested:   - Medical ; ASA    Type of Anesthesia:  Spinal   Additional requests/questions:    [name]   12/05/2022, 8:41 AM

## 2022-12-05 NOTE — Telephone Encounter (Signed)
     Primary Cardiologist: Dietrich Pates, MD  Chart reviewed as part of pre-operative protocol coverage. Given past medical history and time since last visit, based on ACC/AHA guidelines, ARIYON BRUNEY would be at acceptable risk for the planned procedure without further cardiovascular testing.   His RCRI is a class III risk, 6.6% risk of major cardiac event.  He is able to complete greater than 4 METS of physical activity.  Patient completed exercise Myoview that was intermediate risk and completed coronary CTA that showed minimal nonobstructive CAD. Therefore he is cleared to proceed with scheduled surgical with no additional cardiac testing needed at this time.   If necessary his aspirin may be held for 5 to 7 days prior to his surgery.  Please resume as soon as hemostasis is achieved.  I will route this recommendation to the requesting party via Epic fax function and remove from pre-op pool.  Please call with questions.  Thomasene Ripple. Saverio Kader NP-C     12/05/2022, 9:08 AM [facility] [address] Office [phone] Fax [phone]

## 2022-12-21 ENCOUNTER — Other Ambulatory Visit (HOSPITAL_COMMUNITY): Payer: Self-pay

## 2022-12-21 ENCOUNTER — Encounter (HOSPITAL_COMMUNITY): Payer: Self-pay

## 2022-12-21 ENCOUNTER — Other Ambulatory Visit: Payer: Self-pay | Admitting: Hematology and Oncology

## 2022-12-21 MED ORDER — OXYCODONE HCL 15 MG PO TABS
15.0000 mg | ORAL_TABLET | Freq: Four times a day (QID) | ORAL | 0 refills | Status: DC | PRN
  Filled 2022-12-21 – 2022-12-26 (×3): qty 90, 23d supply, fill #0

## 2022-12-23 ENCOUNTER — Other Ambulatory Visit (HOSPITAL_COMMUNITY): Payer: Self-pay

## 2022-12-26 ENCOUNTER — Other Ambulatory Visit (HOSPITAL_COMMUNITY): Payer: Self-pay

## 2023-01-03 ENCOUNTER — Encounter (HOSPITAL_COMMUNITY): Payer: Self-pay

## 2023-01-05 ENCOUNTER — Telehealth: Payer: Self-pay | Admitting: *Deleted

## 2023-01-05 NOTE — Telephone Encounter (Signed)
Faxed oncology surgical clearance for patient signed by Dr. Bertis Ruddy to surgeon's office. Copy sent to be scanned into patient's chart. Fax confirmation received

## 2023-01-06 ENCOUNTER — Ambulatory Visit: Payer: Medicaid Other

## 2023-01-09 ENCOUNTER — Ambulatory Visit: Payer: Medicaid Other | Attending: Cardiology

## 2023-01-09 DIAGNOSIS — E782 Mixed hyperlipidemia: Secondary | ICD-10-CM

## 2023-01-10 LAB — HEPATIC FUNCTION PANEL
ALT: 48 IU/L — ABNORMAL HIGH (ref 0–44)
AST: 69 IU/L — ABNORMAL HIGH (ref 0–40)
Albumin: 4 g/dL (ref 3.8–4.9)
Alkaline Phosphatase: 60 IU/L (ref 44–121)
Bilirubin Total: 0.5 mg/dL (ref 0.0–1.2)
Bilirubin, Direct: 0.17 mg/dL (ref 0.00–0.40)
Total Protein: 6.4 g/dL (ref 6.0–8.5)

## 2023-01-10 LAB — LIPID PANEL
Chol/HDL Ratio: 3.1 ratio (ref 0.0–5.0)
Cholesterol, Total: 220 mg/dL — ABNORMAL HIGH (ref 100–199)
HDL: 71 mg/dL (ref >39)
LDL Chol Calc (NIH): 122 mg/dL — ABNORMAL HIGH (ref 0–99)
Triglycerides: 154 mg/dL — ABNORMAL HIGH (ref 0–149)
VLDL Cholesterol Cal: 27 mg/dL (ref 5–40)

## 2023-01-11 ENCOUNTER — Other Ambulatory Visit (HOSPITAL_COMMUNITY): Payer: Self-pay

## 2023-01-11 ENCOUNTER — Other Ambulatory Visit: Payer: Self-pay | Admitting: Internal Medicine

## 2023-01-11 ENCOUNTER — Other Ambulatory Visit: Payer: Self-pay | Admitting: Hematology and Oncology

## 2023-01-11 MED ORDER — DAPAGLIFLOZIN PROPANEDIOL 10 MG PO TABS
10.0000 mg | ORAL_TABLET | Freq: Every day | ORAL | 2 refills | Status: DC
Start: 2023-01-11 — End: 2023-12-25
  Filled 2023-01-11: qty 90, 90d supply, fill #0
  Filled 2023-03-17: qty 30, 30d supply, fill #0
  Filled 2023-04-09 – 2023-04-10 (×2): qty 90, 90d supply, fill #0
  Filled 2023-07-05: qty 90, 90d supply, fill #1
  Filled 2023-10-11: qty 90, 90d supply, fill #2

## 2023-01-11 MED ORDER — OXYCODONE HCL 15 MG PO TABS
15.0000 mg | ORAL_TABLET | Freq: Four times a day (QID) | ORAL | 0 refills | Status: DC | PRN
  Filled 2023-01-11 – 2023-01-16 (×4): qty 90, 23d supply, fill #0

## 2023-01-11 MED ORDER — ISOSORBIDE MONONITRATE ER 30 MG PO TB24
30.0000 mg | ORAL_TABLET | Freq: Every day | ORAL | 1 refills | Status: DC
  Filled 2023-01-11: qty 90, 90d supply, fill #0
  Filled 2023-04-09: qty 90, 90d supply, fill #1

## 2023-01-12 ENCOUNTER — Other Ambulatory Visit (HOSPITAL_COMMUNITY): Payer: Self-pay

## 2023-01-13 ENCOUNTER — Other Ambulatory Visit: Payer: Self-pay

## 2023-01-13 ENCOUNTER — Other Ambulatory Visit (HOSPITAL_COMMUNITY): Payer: Self-pay

## 2023-01-13 DIAGNOSIS — E782 Mixed hyperlipidemia: Secondary | ICD-10-CM

## 2023-01-13 MED ORDER — SIMVASTATIN 20 MG PO TABS
20.0000 mg | ORAL_TABLET | Freq: Every day | ORAL | 1 refills | Status: DC
  Filled 2023-01-13: qty 90, 90d supply, fill #0
  Filled 2023-05-01: qty 90, 90d supply, fill #1

## 2023-01-16 ENCOUNTER — Other Ambulatory Visit (HOSPITAL_COMMUNITY): Payer: Self-pay

## 2023-01-16 ENCOUNTER — Other Ambulatory Visit: Payer: Self-pay

## 2023-01-23 ENCOUNTER — Encounter: Payer: Self-pay | Admitting: Hematology and Oncology

## 2023-01-31 NOTE — Progress Notes (Unsigned)
Cardiology Office Note    Patient Name: Mason [name] Date of Encounter: 02/01/2023  Primary Care Provider:  French Ana, FNP Primary Cardiologist:  Mason Pates, MD Primary Electrophysiologist: None   Past Medical History    Past Medical History:  Diagnosis Date   Abnormal liver function test    Acute sinusitis, unspecified 05/18/2015   Anemia    Anxiety    mild new dx   Arthritis    knees,hips   Bilateral edema of lower extremity    Complication of anesthesia    Pt stated " my oxygen level was slow in rising."   Concussion    Hx: in high school x 2   Constipation    Dysuria 09/04/2015   Fever    Hyperactive gag reflex    Hypertension    Hypoglycemia    Hypokalemia    Insomnia 06/15/2015   Knee pain, chronic    Malnutrition (HCC)    Non-healing surgical wound 05/23/2014   PEG (percutaneous endoscopic gastrostomy) status (HCC)    Renal failure, acute (HCC)    S/P radiation therapy 08/19/2013-10/15/2013   Right Tonstil and bilateral neck / 70 Gy in 35 fractions to gross disease, 63 Gy in 35 fractions to high risk nodal echelons, and 56 Gy in 35 fractions to intermediate risk nodal echelons   Severe nausea and vomiting    Status post chemotherapy    Only received 2 doses due to uncontrolled nausea and acute renal failure   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  Mason [name] was seen last on 10/03/2022 for surgical clearance.  During visit patient had well-controlled blood pressures and was scheduled to undergo knee procedure.  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  He underwent coronary CTA for further evaluation that revealed a calcium score of 145 with minimal nonobstructive disease.  He was started on simvastatin 20 mg daily.  He was cleared for his knee procedure surgery with no further testing recommended at that time.  During today's visit the patient reports he is doing well with no recurrence of chest pain since his previous follow-up.  He is still pending knee surgery which was rescheduled and is still able to exercise but limited. His blood pressures today was elevated 140/60 and was 136/72 on recheck.  He reports compliance with his current medications however did report not starting Zocor until this past Sunday.  He does report also getting married since our last follow-up and was congratulated for this.  He has lost a total of 8 pounds and is currently abstaining from fried foods and watching his salt intake.  He is aware of the importance of dietary is because he worked as a Investment banker, operational in the prison and also has a degree in Investment banker, corporate.  Patient denies chest pain, palpitations, dyspnea, PND, orthopnea, nausea, vomiting, dizziness, syncope, edema, weight gain, or early satiety.   Review of Systems  Please see the history of present illness.    All other systems reviewed and are otherwise negative except as noted above.  Physical Exam  Wt Readings from Last 3 Encounters:  02/01/23 257 lb 3.2 oz (116.7 kg)  11/08/22 265 lb (120.2 kg)  10/21/22 264 lb 11.2 oz (120.1 kg)   VS: Vitals:   02/01/23 1136  BP: (!) 140/60  Pulse: 97  SpO2: 95%  ,Body mass index is 30.5 kg/m. GEN: Well nourished, well developed in no acute distress Neck: No JVD; No carotid bruits Pulmonary: Clear to auscultation without rales, wheezing or rhonchi  Cardiovascular: Normal rate. Regular rhythm. Normal S1. Normal S2.   Murmurs: There is no murmur.  ABDOMEN: Soft, non-tender, non-distended EXTREMITIES:  No edema; No  deformity   EKG/LABS/ Recent Cardiac Studies   ECG personally reviewed by me today -none completed today  Risk Assessment/Calculations:          Lab Results  Component Value Date   WBC 5.4 10/21/2022   HGB 14.5 10/21/2022   HCT 45.1 10/21/2022   MCV 99.3 10/21/2022   PLT 186 10/21/2022   Lab Results  Component Value Date   CREATININE 1.92 (H) 10/21/2022   BUN 17 10/21/2022   NA 140 10/21/2022   K 5.0 10/21/2022   CL 104 10/21/2022   CO2 30 10/21/2022   Lab Results  Component Value Date   CHOL 220 (H) 01/09/2023   HDL 71 01/09/2023   LDLCALC 122 (H) 01/09/2023   TRIG 154 (H) 01/09/2023   CHOLHDL 3.1 01/09/2023    Lab Results  Component Value Date   HGBA1C 5.7 08/09/2013   Assessment & Plan    1. HFrEF/NICM: -Most recent 2D echo completed at [facility] with EF of 35-40% -Today patient is euvolemic on examination and denies any indiscretions with salt. -Today patient is euvolemic on examination and denies any shortness of breath or dyspnea with exertion. -Continue GDMT with Entresto 49/51 mg twice daily, spironolactone 12.5 mg twice daily Toprol-XL 200 mg daily -Low sodium diet, fluid restriction <2L, and daily weights encouraged. Educated to contact our office for weight gain of 2 lbs overnight or 5 lbs in one week.   2.  Chest pain/mild nonobstructive CAD: -s/p Myoview with intermediate risk and coronary CT completed showing minimal obstruction and calcium score of 145. -Today patient denies any chest pain since previous follow-up. -Continue current GDMT with ASA 81 mg and Zocor 20 mg daily  3.Essential hypertension:  -Patient's blood pressure today was initially elevated at 140/60 and was 136/72 on recheck -Continue on 12.5 mg daily, Toprol-XL 20 mg Entresto 49/51 mg twice daily  4.History of tonsillar cancer: -Patient currently followed by Mason [name]  5.CKD stage III: -Patient's creatinine is 1.5   6.  Hyperlipidemia: -Patient reports not starting Zocor  until this week and has tolerated without any myalgias -We will check LFT and lipids in 8 weeks   Disposition: Follow-up with Mason Pates, MD or APP in 3 months    Signed, Mason [name], Mason Rains, NP 02/01/2023, 11:47 AM [facility]

## 2023-02-01 ENCOUNTER — Encounter: Payer: Self-pay | Admitting: Nurse Practitioner

## 2023-02-01 ENCOUNTER — Ambulatory Visit: Payer: Medicaid Other | Attending: Nurse Practitioner | Admitting: Nurse Practitioner

## 2023-02-01 ENCOUNTER — Other Ambulatory Visit (HOSPITAL_COMMUNITY): Payer: Self-pay

## 2023-02-01 VITALS — BP 136/72 | HR 97 | Ht 77.0 in | Wt 257.2 lb

## 2023-02-01 DIAGNOSIS — C099 Malignant neoplasm of tonsil, unspecified: Secondary | ICD-10-CM | POA: Diagnosis not present

## 2023-02-01 DIAGNOSIS — I5022 Chronic systolic (congestive) heart failure: Secondary | ICD-10-CM | POA: Diagnosis present

## 2023-02-01 DIAGNOSIS — I10 Essential (primary) hypertension: Secondary | ICD-10-CM | POA: Diagnosis present

## 2023-02-01 DIAGNOSIS — R072 Precordial pain: Secondary | ICD-10-CM | POA: Diagnosis not present

## 2023-02-01 DIAGNOSIS — E782 Mixed hyperlipidemia: Secondary | ICD-10-CM

## 2023-02-01 NOTE — Patient Instructions (Signed)
Medication Instructions:  Your physician recommends that you continue on your current medications as directed. Please refer to the Current Medication list given to you today. *If you need a refill on your cardiac medications before your next appointment, please call your pharmacy*   Lab Work: FASTING LABS- LIPIDS AND LIPIDS IN 8 WEEKS If you have labs (blood work) drawn today and your tests are completely normal, you will receive your results only by: MyChart Message (if you have MyChart) OR A paper copy in the mail If you have any lab test that is abnormal or we need to change your treatment, we will call you to review the results.   Testing/Procedures: NONE ORDERED   Follow-Up: At [facility], you and your health needs are our priority.  As part of our continuing mission to provide you with exceptional heart care, we have created designated Provider Care Teams.  These Care Teams include your primary Cardiologist (physician) and Advanced Practice Providers (APPs -  Physician Assistants and Nurse Practitioners) who all work together to provide you with the care you need, when you need it.  We recommend signing up for the patient portal called "MyChart".  Sign up information is provided on this After Visit Summary.  MyChart is used to connect with patients for Virtual Visits (Telemedicine).  Patients are able to view lab/test results, encounter notes, upcoming appointments, etc.  Non-urgent messages can be sent to your provider as well.   To learn more about what you can do with MyChart, go to [email or web address].    Your next appointment:   3 month(s)  Provider:   Dietrich Pates, MD  or Robin Searing, NP    Other Instructions Please check your weight daily. Please contact the office if you gain more than 2lbs in a day or 5lbs in a week.  Limit your salt intake to 1500-2000mg  per day or 500mg  of Sodium per meal.

## 2023-02-06 ENCOUNTER — Other Ambulatory Visit: Payer: Self-pay | Admitting: Hematology and Oncology

## 2023-02-06 ENCOUNTER — Other Ambulatory Visit (HOSPITAL_COMMUNITY): Payer: Self-pay

## 2023-02-06 MED ORDER — OXYCODONE HCL 15 MG PO TABS
15.0000 mg | ORAL_TABLET | Freq: Four times a day (QID) | ORAL | 0 refills | Status: DC | PRN
  Filled 2023-02-06: qty 90, 23d supply, fill #0

## 2023-02-07 NOTE — Progress Notes (Signed)
Please send pre op orders for PST appointment 02/09/23.

## 2023-02-07 NOTE — Progress Notes (Signed)
COVID Vaccine received:  []  No [x]  Yes Date of any COVID positive Test in last 90 days: no PCP - French Ana FNP Cardiologist - Dr. Dietrich Pates  Cardiac clearance- Dr. Dietrich Pates- 12/05/22  Chest x-ray -  EKG -  10/03/22 Stress Test - 11/08/22 Epic ECHO - 11/03/22 Cardiac Cath -   Bowel Prep - [x]  No  []   Yes ______  Pacemaker / ICD device [x]  No []  Yes   Spinal Cord Stimulator:[x]  No []  Yes       History of Sleep Apnea? [x]  No []  Yes   CPAP used?- [x]  No []  Yes    Does the patient monitor blood sugar?          [x]  No []  Yes  []  N/A  Patient has: [x]  NO Hx DM   []  Pre-DM                 []  DM1  []   DM2 Does patient have a [employer] or Dexacom? []  No []  Yes   Fasting Blood Sugar Ranges-  Checks Blood Sugar _____ times a day  GLP1 agonist / usual dose - no GLP1 instructions:  SGLT-2 inhibitors / usual dose - no SGLT-2 instructions:   Blood Thinner / Instructions: Aspirin Instructions:81 mg ASA. Hold 5-7 days prior to surgery  Comments:   Activity level: Patient is able  to climb a flight of stairs without difficulty; [x]  No CP  [x]  No SOB,  Patient can /  perform ADLs without assistance.   Anesthesia review: HTN, COPD,CKD3, hx. Tonsillar cancer.  Patient denies shortness of breath, fever, cough and chest pain at PAT appointment.  Patient verbalized understanding and agreement to the Pre-Surgical Instructions that were given to them at this PAT appointment. Patient was also educated of the need to review these PAT instructions again prior to his/her surgery.I reviewed the appropriate phone numbers to call if they have any and questions or concerns.

## 2023-02-07 NOTE — Patient Instructions (Signed)
SURGICAL WAITING ROOM VISITATION  Patients having surgery or a procedure may have no more than 2 support people in the waiting area - these visitors may rotate.    Children under the age of 18 must have an adult with them who is not the patient.  Due to an increase in RSV and influenza rates and associated hospitalizations, children ages 32 and under may not visit patients in [facility].  If the patient needs to stay at the hospital during part of their recovery, the visitor guidelines for inpatient rooms apply. Pre-op nurse will coordinate an appropriate time for 1 support person to accompany patient in pre-op.  This support person may not rotate.    Please refer to the [facility] website for the visitor guidelines for Inpatients (after your surgery is over and you are in a regular room).       Your procedure is scheduled on: 02/20/23   Report to [facility] Main Entrance    Report to admitting at 8:45  AM   Call this number if you have problems the morning of surgery [phone]   Do not eat food or drink liquids  :After Midnight.       Oral Hygiene is also important to reduce your risk of infection.                                    Remember - BRUSH YOUR TEETH THE MORNING OF SURGERY WITH YOUR REGULAR TOOTHPASTE   Stop all vitamins and herbal supplements 7 days before surgery.   Take these medicines the morning of surgery with A SIP OF WATER: Isosorbide(imdur), Levothyroxine, metoprolol, omeprazole, simvastatin, spironolactone  DO NOT TAKE ANY ORAL DIABETIC MEDICATIONS DAY OF YOUR SURGERY Hold Farxiga for 72 hours prior to surgery.Last dose 02/16/23             You may not have any metal on your body including hair pins, jewelry, and body piercing             Do not wear make-up, lotions, powders, perfumes/cologne, or deodorant   Do not use any denture adhesive the day of surgery             Men may shave face and neck.   Do not bring valuables to  the hospital. [facility] IS NOT             RESPONSIBLE   FOR VALUABLES.   Contacts, glasses, dentures or bridgework may not be worn into surgery.   Bring small overnight bag day of surgery.   DO NOT BRING YOUR HOME MEDICATIONS TO THE HOSPITAL. PHARMACY WILL DISPENSE MEDICATIONS LISTED ON YOUR MEDICATION LIST TO YOU DURING YOUR ADMISSION IN THE HOSPITAL!    Patients discharged on the day of surgery will not be allowed to drive home.  Someone NEEDS to stay with you for the first 24 hours after anesthesia.   Special Instructions: Bring a copy of your healthcare power of attorney and living will documents the day of surgery if you haven't scanned them before.              Please read over the following fact sheets you were given: IF YOU HAVE QUESTIONS ABOUT YOUR PRE-OP INSTRUCTIONS PLEASE CALL [phone] [facility]   If you received a COVID test during your pre-op visit  it is requested that you wear a mask when out in public,  stay away from anyone that may not be feeling well and notify your surgeon if you develop symptoms. If you test positive for Covid or have been in contact with anyone that has tested positive in the last 10 days please notify you surgeon.      Pre-operative 5 CHG Bath Instructions   You can play a key role in reducing the risk of infection after surgery. Your skin needs to be as free of germs as possible. You can reduce the number of germs on your skin by washing with CHG (chlorhexidine gluconate) soap before surgery. CHG is an antiseptic soap that kills germs and continues to kill germs even after washing.   DO NOT use if you have an allergy to chlorhexidine/CHG or antibacterial soaps. If your skin becomes reddened or irritated, stop using the CHG and notify one of our RNs at [phone].   Please shower with the CHG soap starting 4 days before surgery using the following schedule:     Please keep in mind the following:  DO NOT shave, including legs and underarms,  starting the day of your first shower.   You may shave your face at any point before/day of surgery.  Place clean sheets on your bed the day you start using CHG soap. Use a clean washcloth (not used since being washed) for each shower. DO NOT sleep with pets once you start using the CHG.   CHG Shower Instructions:  If you choose to wash your hair and private area, wash first with your normal shampoo/soap.  After you use shampoo/soap, rinse your hair and body thoroughly to remove shampoo/soap residue.  Turn the water OFF and apply about 3 tablespoons (45 ml) of CHG soap to a CLEAN washcloth.  Apply CHG soap ONLY FROM YOUR NECK DOWN TO YOUR TOES (washing for 3-5 minutes)  DO NOT use CHG soap on face, private areas, open wounds, or sores.  Pay special attention to the area where your surgery is being performed.  If you are having back surgery, having someone wash your back for you may be helpful. Wait 2 minutes after CHG soap is applied, then you may rinse off the CHG soap.  Pat dry with a clean towel  Put on clean clothes/pajamas   If you choose to wear lotion, please use ONLY the CHG-compatible lotions on the back of this paper.     Additional instructions for the day of surgery: DO NOT APPLY any lotions, deodorants, cologne, or perfumes.   Put on clean/comfortable clothes.  Brush your teeth.  Ask your nurse before applying any prescription medications to the skin.      CHG Compatible Lotions   Aveeno Moisturizing lotion  Cetaphil Moisturizing Cream  Cetaphil Moisturizing Lotion  Clairol Herbal Essence Moisturizing Lotion, Dry Skin  Clairol Herbal Essence Moisturizing Lotion, Extra Dry Skin  Clairol Herbal Essence Moisturizing Lotion, Normal Skin  Curel Age Defying Therapeutic Moisturizing Lotion with Alpha Hydroxy  Curel Extreme Care Body Lotion  Curel Soothing Hands Moisturizing Hand Lotion  Curel Therapeutic Moisturizing Cream, Fragrance-Free  Curel Therapeutic Moisturizing  Lotion, Fragrance-Free  Curel Therapeutic Moisturizing Lotion, Original Formula  Eucerin Daily Replenishing Lotion  Eucerin Dry Skin Therapy Plus Alpha Hydroxy Crme  Eucerin Dry Skin Therapy Plus Alpha Hydroxy Lotion  Eucerin Original Crme  Eucerin Original Lotion  Eucerin Plus Crme Eucerin Plus Lotion  Eucerin TriLipid Replenishing Lotion  Keri Anti-Bacterial Hand Lotion  Keri Deep Conditioning Original Lotion Dry Skin Formula Softly Scented  Keri Deep  Conditioning Original Lotion, Fragrance Free Sensitive Skin Formula  Keri Lotion Fast Absorbing Fragrance Free Sensitive Skin Formula  Keri Lotion Fast Absorbing Softly Scented Dry Skin Formula  Keri Original Lotion  Keri Skin Renewal Lotion Keri Silky Smooth Lotion  Keri Silky Smooth Sensitive Skin Lotion  Nivea Body Creamy Conditioning Oil  Nivea Body Extra Enriched Teacher, adult education Moisturizing Lotion Nivea Crme  Nivea Skin Firming Lotion  NutraDerm 30 Skin Lotion  NutraDerm Skin Lotion  NutraDerm Therapeutic Skin Cream  NutraDerm Therapeutic Skin Lotion  ProShield Protective Hand Cream  Provon moisturizing lotion

## 2023-02-09 ENCOUNTER — Encounter (HOSPITAL_COMMUNITY): Payer: Self-pay

## 2023-02-09 ENCOUNTER — Encounter (HOSPITAL_COMMUNITY)
Admission: RE | Admit: 2023-02-09 | Discharge: 2023-02-09 | Disposition: A | Discharge status: Home / Self Care | Payer: Medicaid Other | Source: Ambulatory Visit | Attending: Orthopedic Surgery | Admitting: Orthopedic Surgery

## 2023-02-09 ENCOUNTER — Other Ambulatory Visit: Payer: Self-pay

## 2023-02-09 VITALS — BP 123/85 | HR 76 | Temp 98.5°F | Resp 16 | Ht 77.0 in | Wt 255.0 lb

## 2023-02-09 DIAGNOSIS — I10 Essential (primary) hypertension: Secondary | ICD-10-CM

## 2023-02-09 DIAGNOSIS — J449 Chronic obstructive pulmonary disease, unspecified: Secondary | ICD-10-CM | POA: Diagnosis not present

## 2023-02-09 DIAGNOSIS — N183 Chronic kidney disease, stage 3 unspecified: Secondary | ICD-10-CM | POA: Diagnosis not present

## 2023-02-09 DIAGNOSIS — M1711 Unilateral primary osteoarthritis, right knee: Secondary | ICD-10-CM | POA: Diagnosis not present

## 2023-02-09 DIAGNOSIS — I129 Hypertensive chronic kidney disease with stage 1 through stage 4 chronic kidney disease, or unspecified chronic kidney disease: Secondary | ICD-10-CM | POA: Diagnosis present

## 2023-02-09 DIAGNOSIS — Z01818 Encounter for other preprocedural examination: Secondary | ICD-10-CM

## 2023-02-09 DIAGNOSIS — Z85818 Personal history of malignant neoplasm of other sites of lip, oral cavity, and pharynx: Secondary | ICD-10-CM | POA: Insufficient documentation

## 2023-02-09 DIAGNOSIS — Z01812 Encounter for preprocedural laboratory examination: Secondary | ICD-10-CM | POA: Diagnosis not present

## 2023-02-09 HISTORY — DX: Hypothyroidism, unspecified: E03.9

## 2023-02-09 HISTORY — DX: Depression, unspecified: F32.A

## 2023-02-09 LAB — BASIC METABOLIC PANEL
Anion gap: 9 (ref 5–15)
BUN: 12 mg/dL (ref 6–20)
CO2: 27 mmol/L (ref 22–32)
Calcium: 9.5 mg/dL (ref 8.9–10.3)
Chloride: 103 mmol/L (ref 98–111)
Creatinine, Ser: 1.7 mg/dL — ABNORMAL HIGH (ref 0.61–1.24)
GFR, Estimated: 47 mL/min — ABNORMAL LOW (ref >60)
Glucose, Bld: 83 mg/dL (ref 70–99)
Potassium: 4.5 mmol/L (ref 3.5–5.1)
Sodium: 139 mmol/L (ref 135–145)

## 2023-02-09 LAB — CBC
HCT: 50.1 % (ref 39.0–52.0)
Hemoglobin: 16 g/dL (ref 13.0–17.0)
MCH: 32.9 pg (ref 26.0–34.0)
MCHC: 31.9 g/dL (ref 30.0–36.0)
MCV: 103.1 fL — ABNORMAL HIGH (ref 80.0–100.0)
Platelets: 209 10*3/uL (ref 150–400)
RBC: 4.86 MIL/uL (ref 4.22–5.81)
RDW: 14.1 % (ref 11.5–15.5)
WBC: 6.4 10*3/uL (ref 4.0–10.5)
nRBC: 0 % (ref 0.0–0.2)

## 2023-02-09 LAB — SURGICAL PCR SCREEN
MRSA, PCR: NEGATIVE
Staphylococcus aureus: POSITIVE — AB

## 2023-02-09 NOTE — Progress Notes (Signed)
Please review pt's pre op BMP results from 02/09/23

## 2023-02-10 ENCOUNTER — Other Ambulatory Visit: Payer: Self-pay | Admitting: Orthopedic Surgery

## 2023-02-10 DIAGNOSIS — G8929 Other chronic pain: Secondary | ICD-10-CM

## 2023-02-10 NOTE — Progress Notes (Signed)
Please review pt's pre op PCR results from 02/09/23.

## 2023-02-10 NOTE — Progress Notes (Signed)
Anesthesia Chart Review   Case: [id number] Date/Time: 02/20/23 1100   Procedure: TOTAL KNEE ARTHROPLASTY (Right: Knee)   Anesthesia type: Spinal   Pre-op diagnosis: Osteoarthritis of right knee  M17.11   Location: [facility] ROOM 07 / [facility]   Surgeons: Dannielle Huh, MD       DISCUSSION:57 y.o. former smoker with h/o HTN, tonsillar cancer s/p chemo/radiation, CKD Stage III, COPD, right knee OA scheduled for above procedure 02/20/2023 with Dr. Dannielle Huh.   Pt last seen by cardiology 02/01/2023. Per OV note, "He was cleared for his knee procedure surgery with no further testing recommended at that time. "  Per preoperative evaluation 12/05/2022, "Chart reviewed as part of pre-operative protocol coverage. Given past medical history and time since last visit, based on ACC/AHA guidelines, Mason [name] would be at acceptable risk for the planned procedure without further cardiovascular testing.    His RCRI is a class III risk, 6.6% risk of major cardiac event.  He is able to complete greater than 4 METS of physical activity.   Patient completed exercise Myoview that was intermediate risk and completed coronary CTA that showed minimal nonobstructive CAD. Therefore he is cleared to proceed with scheduled surgical with no additional cardiac testing needed at this time.    If necessary his aspirin may be held for 5 to 7 days prior to his surgery.  Please resume as soon as hemostasis is achieved."   VS: BP 123/85   Pulse 76   Temp 36.9 C (Oral)   Resp 16   Ht 6\' 5"  (1.956 m)   Wt 115.7 kg   SpO2 96%   BMI 30.24 kg/m   PROVIDERS: French Ana, FNP is PCP    LABS: Labs reviewed: Acceptable for surgery. (all labs ordered are listed, but only abnormal results are displayed)  Labs Reviewed  SURGICAL PCR SCREEN - Abnormal; Notable for the following components:      Result Value   Staphylococcus aureus POSITIVE (*)    All other components within normal limits  CBC - Abnormal;  Notable for the following components:   MCV 103.1 (*)    All other components within normal limits  BASIC METABOLIC PANEL - Abnormal; Notable for the following components:   Creatinine, Ser 1.70 (*)    GFR, Estimated 47 (*)    All other components within normal limits     IMAGES: CT Coronary 11/17/2022 IMPRESSION: 1. Minimal mixed non-obstructive CAD, CADRADS = 1.   2. Coronary artery calcium score is 145, which places the patient in the 93rd percentile for age/race and sex-matched control.   3. Normal coronary origin with right dominance.   4. Given age-advanced CAD, would recommend aggressive cardiovascular risk factor modification.    EKG:   CV: Myocardial Perfusion 11/08/2022   Findings are consistent with infarction. The study is intermediate risk.   No ST deviation was noted.   LV perfusion is abnormal. There is no evidence of ischemia. There is evidence of infarction. Defect 1: There is a medium defect with mild reduction in uptake present in the apical to mid inferior location(s) that is fixed. There is abnormal wall motion in the defect area. Consistent with infarction.   Left ventricular function is abnormal. Global function is mildly reduced. Nuclear stress EF: 41%. End diastolic cavity size is normal. End systolic cavity size is normal. No evidence of transient ischemic dilation (TID) noted.   Prior study not available for comparison.  Echo 11/03/2022  1. Left ventricular ejection  fraction, by estimation, is 55 to 60%. The  left ventricle has normal function. The left ventricle has no regional  wall motion abnormalities. There is mild concentric left ventricular  hypertrophy. Left ventricular diastolic  parameters are consistent with Grade I diastolic dysfunction (impaired  relaxation).   2. Right ventricular systolic function is normal. The right ventricular  size is normal. Tricuspid regurgitation signal is inadequate for assessing  PA pressure.   3. The mitral  valve is normal in structure. No evidence of mitral valve  regurgitation. No evidence of mitral stenosis.   4. The aortic valve is tricuspid. Aortic valve regurgitation is not  visualized. No aortic stenosis is present.   5. Technically difficult study with poor acoustic windows.   Past Medical History:  Diagnosis Date   Abnormal liver function test    Acute sinusitis, unspecified 05/18/2015   Anemia    Anxiety    mild new dx   Arthritis    knees,hips   Bilateral edema of lower extremity    Complication of anesthesia    Pt stated " my oxygen level was slow in rising."   Concussion    Hx: in high school x 2   Constipation    Depression    Dysuria 09/04/2015   Fever    Hyperactive gag reflex    Hypertension    Hypoglycemia    Hypokalemia    Hypothyroidism    Insomnia 06/15/2015   Knee pain, chronic    Malnutrition (HCC)    Non-healing surgical wound 05/23/2014   PEG (percutaneous endoscopic gastrostomy) status (HCC)    Renal failure, acute (HCC)    S/P radiation therapy 08/19/2013-10/15/2013   Right Tonstil and bilateral neck / 70 Gy in 35 fractions to gross disease, 63 Gy in 35 fractions to high risk nodal echelons, and 56 Gy in 35 fractions to intermediate risk nodal echelons   Severe nausea and vomiting    Status post chemotherapy    Only received 2 doses due to uncontrolled nausea and acute renal failure   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, recurrent 2016   Vitamin B12 deficiency 01/07/2022    Past Surgical History:  Procedure Laterality Date   IR REMOVAL TUN ACCESS W/ PORT W/O FL MOD SED  02/11/2022   LAPAROSCOPIC GASTROSTOMY N/A 08/15/2013   Procedure: LAPAROSCOPIC GASTROSTOMY TUBE PLACEMENT;  Surgeon: Axel Filler, MD;  Location: [facility] OR;  Service: General;  Laterality: N/A;   LYMPH NODE BIOPSY  03/20/14   right neck   MULTIPLE EXTRACTIONS WITH ALVEOLOPLASTY N/A 08/01/2013   Procedure: Extraction of tooth #'s 1,15,17,31, 32 with  alveoloplasty, mandibular left torus reduction, and gross debridement of remaining teeth.;  Surgeon: Charlynne Pander, DDS;  Location: [facility] OR;  Service: Oral Surgery;  Laterality: N/A;   PORT-A-CATH REMOVAL N/A 05/13/2014   Procedure: REMOVAL of PORT-A-CATH;  Surgeon: Axel Filler, MD;  Location: [facility] OR;  Service: General;  Laterality: N/A;   PORTACATH PLACEMENT Left 08/15/2013   Procedure: INSERTION PORT-A-CATH;  Surgeon: Axel Filler, MD;  Location: [facility] OR;  Service: General;  Laterality: Left;    MEDICATIONS:  Apremilast (OTEZLA) 30 MG TABS   aspirin EC 81 MG tablet   cholecalciferol (VITAMIN D3) 25 MCG (1000 UNIT) tablet   cyanocobalamin (VITAMIN B12) 1000 MCG/ML injection   dapagliflozin propanediol (FARXIGA) 10 MG TABS tablet   isosorbide mononitrate (IMDUR) 30 MG 24 hr tablet   levothyroxine (SYNTHROID) 100 MCG tablet   metoprolol (TOPROL XL) 200  MG 24 hr tablet   nitroGLYCERIN (NITROSTAT) 0.4 MG SL tablet   omeprazole (PRILOSEC) 20 MG capsule   oxyCODONE (ROXICODONE) 15 MG immediate release tablet   sacubitril-valsartan (ENTRESTO) 49-51 MG   simvastatin (ZOCOR) 20 MG tablet   Skin Protectants, Misc. (MINERIN CREME) CREA   spironolactone (ALDACTONE) 25 MG tablet   triamcinolone cream (KENALOG) 0.1 %   No current facility-administered medications for this encounter.    sodium chloride 0.9 % injection 10 mL   sodium chloride 0.9 % injection 10 mL     Jodell Cipro Ward, PA-C [facility] Pre-Surgical Testing [phone]

## 2023-02-10 NOTE — Anesthesia Preprocedure Evaluation (Addendum)
Anesthesia Evaluation  Patient identified by MRN, date of birth, ID band Patient awake    Reviewed: Allergy & Precautions, NPO status , Patient's Chart, lab work & pertinent test results, reviewed documented beta blocker date and time   History of Anesthesia Complications (+) history of anesthetic complications  Airway Mallampati: II  TM Distance: >3 FB Neck ROM: Limited   Comment: Tonsillar cancer s/p radiation Dental  (+) Edentulous Lower, Edentulous Upper   Pulmonary COPD, former smoker   breath sounds clear to auscultation       Cardiovascular hypertension, (-) angina + Past MI and +CHF  (-) CAD, (-) Cardiac Stents, (-) CABG and (-) Orthopnea (-) pacemaker(-) Cardiac Defibrillator (-) Valvular Problems/Murmurs Rhythm:Regular Rate:Normal  Recent TTE with preserved LVEF, no significant valvular pathology; mildly reduced EF noted on lexiscan   Neuro/Psych neg Seizures PSYCHIATRIC DISORDERS Anxiety Depression       GI/Hepatic ,,,(+) neg Cirrhosis        Endo/Other  Hypothyroidism    Renal/GU CRFRenal disease     Musculoskeletal  (+) Arthritis ,    Abdominal   Peds  Hematology  (+) Blood dyscrasia, anemia   Anesthesia Other Findings   Reproductive/Obstetrics                             Anesthesia Physical Anesthesia Plan  ASA: 3  Anesthesia Plan: Spinal   Post-op Pain Management: Regional block*   Induction: Intravenous  PONV Risk Score and Plan: 1 and Ondansetron  Airway Management Planned:   Additional Equipment:   Intra-op Plan:   Post-operative Plan:   Informed Consent: I have reviewed the patients History and Physical, chart, labs and discussed the procedure including the risks, benefits and alternatives for the proposed anesthesia with the patient or authorized representative who has indicated his/her understanding and acceptance.     Dental advisory given  Plan  Discussed with: CRNA  Anesthesia Plan Comments: (See PAT note 02/09/2023)       Anesthesia Quick Evaluation

## 2023-02-20 ENCOUNTER — Other Ambulatory Visit (HOSPITAL_COMMUNITY): Payer: Self-pay

## 2023-02-20 ENCOUNTER — Other Ambulatory Visit: Payer: Self-pay

## 2023-02-20 ENCOUNTER — Encounter (HOSPITAL_COMMUNITY)
Admission: RE | Disposition: A | Discharge status: Home / Self Care | Payer: Self-pay | Source: Ambulatory Visit | Attending: Orthopedic Surgery

## 2023-02-20 ENCOUNTER — Encounter (HOSPITAL_COMMUNITY): Payer: Self-pay | Admitting: Orthopedic Surgery

## 2023-02-20 ENCOUNTER — Ambulatory Visit (HOSPITAL_COMMUNITY): Payer: Medicaid Other | Admitting: Physician Assistant

## 2023-02-20 ENCOUNTER — Ambulatory Visit (HOSPITAL_COMMUNITY): Payer: Medicaid Other | Admitting: Certified Registered Nurse Anesthetist

## 2023-02-20 ENCOUNTER — Ambulatory Visit (HOSPITAL_COMMUNITY)
Admission: RE | Admit: 2023-02-20 | Discharge: 2023-02-20 | Disposition: A | Discharge status: Home / Self Care | Payer: Medicaid Other | Source: Ambulatory Visit | Attending: Orthopedic Surgery | Admitting: Orthopedic Surgery

## 2023-02-20 DIAGNOSIS — Z01818 Encounter for other preprocedural examination: Secondary | ICD-10-CM

## 2023-02-20 DIAGNOSIS — I130 Hypertensive heart and chronic kidney disease with heart failure and stage 1 through stage 4 chronic kidney disease, or unspecified chronic kidney disease: Secondary | ICD-10-CM | POA: Diagnosis not present

## 2023-02-20 DIAGNOSIS — Z8261 Family history of arthritis: Secondary | ICD-10-CM | POA: Diagnosis not present

## 2023-02-20 DIAGNOSIS — Z833 Family history of diabetes mellitus: Secondary | ICD-10-CM | POA: Diagnosis not present

## 2023-02-20 DIAGNOSIS — I10 Essential (primary) hypertension: Secondary | ICD-10-CM

## 2023-02-20 DIAGNOSIS — I110 Hypertensive heart disease with heart failure: Secondary | ICD-10-CM

## 2023-02-20 DIAGNOSIS — M1711 Unilateral primary osteoarthritis, right knee: Secondary | ICD-10-CM | POA: Diagnosis present

## 2023-02-20 DIAGNOSIS — Z8249 Family history of ischemic heart disease and other diseases of the circulatory system: Secondary | ICD-10-CM | POA: Insufficient documentation

## 2023-02-20 DIAGNOSIS — I509 Heart failure, unspecified: Secondary | ICD-10-CM | POA: Diagnosis not present

## 2023-02-20 DIAGNOSIS — E1122 Type 2 diabetes mellitus with diabetic chronic kidney disease: Secondary | ICD-10-CM | POA: Insufficient documentation

## 2023-02-20 DIAGNOSIS — G8929 Other chronic pain: Secondary | ICD-10-CM

## 2023-02-20 DIAGNOSIS — J439 Emphysema, unspecified: Secondary | ICD-10-CM | POA: Diagnosis not present

## 2023-02-20 DIAGNOSIS — Z931 Gastrostomy status: Secondary | ICD-10-CM | POA: Insufficient documentation

## 2023-02-20 DIAGNOSIS — Z8616 Personal history of COVID-19: Secondary | ICD-10-CM | POA: Insufficient documentation

## 2023-02-20 DIAGNOSIS — R262 Difficulty in walking, not elsewhere classified: Secondary | ICD-10-CM | POA: Diagnosis not present

## 2023-02-20 DIAGNOSIS — Z87891 Personal history of nicotine dependence: Secondary | ICD-10-CM | POA: Insufficient documentation

## 2023-02-20 DIAGNOSIS — N183 Chronic kidney disease, stage 3 unspecified: Secondary | ICD-10-CM | POA: Diagnosis not present

## 2023-02-20 HISTORY — PX: TOTAL KNEE ARTHROPLASTY: SHX125

## 2023-02-20 LAB — CBC WITH DIFFERENTIAL/PLATELET
Abs Immature Granulocytes: 0.02 10*3/uL (ref 0.00–0.07)
Basophils Absolute: 0 10*3/uL (ref 0.0–0.1)
Basophils Relative: 0 %
Eosinophils Absolute: 0 10*3/uL (ref 0.0–0.5)
Eosinophils Relative: 1 %
HCT: 43.5 % (ref 39.0–52.0)
Hemoglobin: 14.1 g/dL (ref 13.0–17.0)
Immature Granulocytes: 0 %
Lymphocytes Relative: 16 %
Lymphs Abs: 0.7 10*3/uL (ref 0.7–4.0)
MCH: 33.2 pg (ref 26.0–34.0)
MCHC: 32.4 g/dL (ref 30.0–36.0)
MCV: 102.4 fL — ABNORMAL HIGH (ref 80.0–100.0)
Monocytes Absolute: 0.2 10*3/uL (ref 0.1–1.0)
Monocytes Relative: 4 %
Neutro Abs: 3.5 10*3/uL (ref 1.7–7.7)
Neutrophils Relative %: 79 %
Platelets: 195 10*3/uL (ref 150–400)
RBC: 4.25 MIL/uL (ref 4.22–5.81)
RDW: 13.4 % (ref 11.5–15.5)
WBC: 4.5 10*3/uL (ref 4.0–10.5)
nRBC: 0 % (ref 0.0–0.2)

## 2023-02-20 LAB — COMPREHENSIVE METABOLIC PANEL
ALT: 39 U/L (ref 0–44)
AST: 44 U/L — ABNORMAL HIGH (ref 15–41)
Albumin: 3.7 g/dL (ref 3.5–5.0)
Alkaline Phosphatase: 50 U/L (ref 38–126)
Anion gap: 11 (ref 5–15)
BUN: 12 mg/dL (ref 6–20)
CO2: 25 mmol/L (ref 22–32)
Calcium: 9.1 mg/dL (ref 8.9–10.3)
Chloride: 100 mmol/L (ref 98–111)
Creatinine, Ser: 1.47 mg/dL — ABNORMAL HIGH (ref 0.61–1.24)
GFR, Estimated: 56 mL/min — ABNORMAL LOW (ref >60)
Glucose, Bld: 85 mg/dL (ref 70–99)
Potassium: 4.7 mmol/L (ref 3.5–5.1)
Sodium: 136 mmol/L (ref 135–145)
Total Bilirubin: 0.8 mg/dL (ref <1.2)
Total Protein: 7.3 g/dL (ref 6.5–8.1)

## 2023-02-20 SURGERY — ARTHROPLASTY, KNEE, TOTAL
Anesthesia: Spinal | Site: Knee | Laterality: Right

## 2023-02-20 MED ORDER — SODIUM CHLORIDE (PF) 0.9 % IJ SOLN
INTRAMUSCULAR | Status: AC
  Filled 2023-02-20: qty 10

## 2023-02-20 MED ORDER — 0.9 % SODIUM CHLORIDE (POUR BTL) OPTIME
TOPICAL | Status: DC | PRN
  Administered 2023-02-20: 1000 mL

## 2023-02-20 MED ORDER — OXYCODONE HCL 5 MG PO TABS: 5.0000 mg | ORAL_TABLET | Freq: Once | ORAL | Status: AC

## 2023-02-20 MED ORDER — ALBUMIN HUMAN 5 % IV SOLN
12.5000 g | Freq: Once | INTRAVENOUS | Status: AC
  Administered 2023-02-20: 12.5 g via INTRAVENOUS

## 2023-02-20 MED ORDER — HYDROMORPHONE HCL 2 MG PO TABS
2.0000 mg | ORAL_TABLET | Freq: Four times a day (QID) | ORAL | 0 refills | Status: AC | PRN
  Filled 2023-02-20: qty 10, 2d supply, fill #0

## 2023-02-20 MED ORDER — LACTATED RINGERS IV BOLUS
500.0000 mL | Freq: Once | INTRAVENOUS | Status: AC
  Administered 2023-02-20: 500 mL via INTRAVENOUS

## 2023-02-20 MED ORDER — PHENYLEPHRINE 80 MCG/ML (10ML) SYRINGE FOR IV PUSH (FOR BLOOD PRESSURE SUPPORT)
PREFILLED_SYRINGE | INTRAVENOUS | Status: AC
  Filled 2023-02-20: qty 10

## 2023-02-20 MED ORDER — OXYCODONE HCL 5 MG/5ML PO SOLN: 5.0000 mg | Freq: Once | ORAL | Status: AC | PRN

## 2023-02-20 MED ORDER — METHOCARBAMOL 500 MG PO TABS
500.0000 mg | ORAL_TABLET | Freq: Four times a day (QID) | ORAL | 0 refills | Status: DC
  Filled 2023-02-20: qty 60, 8d supply, fill #0

## 2023-02-20 MED ORDER — POVIDONE-IODINE 10 % EX SWAB
2.0000 | Freq: Once | CUTANEOUS | Status: DC
Start: 2023-02-20 — End: 2023-02-20

## 2023-02-20 MED ORDER — OXYCODONE HCL 5 MG PO TABS
ORAL_TABLET | ORAL | Status: AC
  Administered 2023-02-20: 5 mg via ORAL
  Filled 2023-02-20: qty 1

## 2023-02-20 MED ORDER — MIDAZOLAM HCL 2 MG/2ML IJ SOLN
2.0000 mg | Freq: Once | INTRAMUSCULAR | Status: AC
  Administered 2023-02-20: 2 mg via INTRAVENOUS
  Filled 2023-02-20: qty 2

## 2023-02-20 MED ORDER — DROPERIDOL 2.5 MG/ML IJ SOLN: 0.6250 mg | Freq: Once | INTRAMUSCULAR | Status: DC

## 2023-02-20 MED ORDER — ORAL CARE MOUTH RINSE: 15.0000 mL | Freq: Once | OROMUCOSAL | Status: AC

## 2023-02-20 MED ORDER — BUPIVACAINE-EPINEPHRINE 0.25% -1:200000 IJ SOLN
INTRAMUSCULAR | Status: AC
  Filled 2023-02-20: qty 1

## 2023-02-20 MED ORDER — PHENYLEPHRINE HCL (PRESSORS) 10 MG/ML IV SOLN
INTRAVENOUS | Status: AC
  Filled 2023-02-20: qty 1

## 2023-02-20 MED ORDER — DEXAMETHASONE SODIUM PHOSPHATE 4 MG/ML IJ SOLN
INTRAMUSCULAR | Status: DC | PRN
  Administered 2023-02-20: 8 mg via INTRAVENOUS

## 2023-02-20 MED ORDER — BUPIVACAINE IN DEXTROSE 0.75-8.25 % IT SOLN
INTRATHECAL | Status: DC | PRN
  Administered 2023-02-20: 1.8 mL via INTRATHECAL

## 2023-02-20 MED ORDER — PROPOFOL 10 MG/ML IV BOLUS
INTRAVENOUS | Status: AC
  Filled 2023-02-20: qty 20

## 2023-02-20 MED ORDER — ONDANSETRON HCL 4 MG/2ML IJ SOLN
4.0000 mg | Freq: Four times a day (QID) | INTRAMUSCULAR | Status: DC | PRN
Start: 2023-02-20 — End: 2023-02-20

## 2023-02-20 MED ORDER — WATER FOR IRRIGATION, STERILE IR SOLN
Status: DC | PRN
  Administered 2023-02-20: 1000 mL

## 2023-02-20 MED ORDER — MIDAZOLAM HCL 5 MG/5ML IJ SOLN
INTRAMUSCULAR | Status: DC | PRN
  Administered 2023-02-20: 2 mg via INTRAVENOUS

## 2023-02-20 MED ORDER — CHLORHEXIDINE GLUCONATE 0.12 % MT SOLN
15.0000 mL | Freq: Once | OROMUCOSAL | Status: AC
  Administered 2023-02-20: 15 mL via OROMUCOSAL

## 2023-02-20 MED ORDER — FENTANYL CITRATE PF 50 MCG/ML IJ SOSY: 100.0000 ug | PREFILLED_SYRINGE | Freq: Once | INTRAMUSCULAR | Status: DC

## 2023-02-20 MED ORDER — ONDANSETRON 4 MG PO TBDP
4.0000 mg | ORAL_TABLET | Freq: Three times a day (TID) | ORAL | 0 refills | Status: DC | PRN
  Filled 2023-02-20: qty 20, 7d supply, fill #0

## 2023-02-20 MED ORDER — DEXAMETHASONE SODIUM PHOSPHATE 10 MG/ML IJ SOLN
INTRAMUSCULAR | Status: DC | PRN
  Administered 2023-02-20: 10 mg

## 2023-02-20 MED ORDER — OXYCODONE HCL 5 MG PO TABS
5.0000 mg | ORAL_TABLET | Freq: Once | ORAL | Status: AC | PRN
  Administered 2023-02-20: 5 mg via ORAL

## 2023-02-20 MED ORDER — DEXAMETHASONE SODIUM PHOSPHATE 10 MG/ML IJ SOLN
8.0000 mg | Freq: Once | INTRAMUSCULAR | Status: DC
Start: 2023-02-20 — End: 2023-02-20

## 2023-02-20 MED ORDER — CELECOXIB 200 MG PO CAPS
200.0000 mg | ORAL_CAPSULE | Freq: Two times a day (BID) | ORAL | 1 refills | Status: DC
  Filled 2023-02-20: qty 60, 30d supply, fill #0
  Filled 2023-03-17: qty 60, 30d supply, fill #1

## 2023-02-20 MED ORDER — CLONIDINE HCL (ANALGESIA) 100 MCG/ML EP SOLN
EPIDURAL | Status: DC | PRN
  Administered 2023-02-20: 100 ug

## 2023-02-20 MED ORDER — METHOCARBAMOL 500 MG PO TABS
ORAL_TABLET | ORAL | Status: AC
  Administered 2023-02-20: 500 mg via ORAL
  Filled 2023-02-20: qty 1

## 2023-02-20 MED ORDER — EPHEDRINE SULFATE (PRESSORS) 50 MG/ML IJ SOLN
INTRAMUSCULAR | Status: DC | PRN
  Administered 2023-02-20: 15 mg via INTRAVENOUS
  Administered 2023-02-20: 10 mg via INTRAVENOUS

## 2023-02-20 MED ORDER — ONDANSETRON HCL 4 MG/2ML IJ SOLN
4.0000 mg | Freq: Once | INTRAMUSCULAR | Status: AC | PRN
  Administered 2023-02-20: 4 mg via INTRAVENOUS

## 2023-02-20 MED ORDER — HYDROMORPHONE HCL 1 MG/ML IJ SOLN: 0.5000 mg | INTRAMUSCULAR | Status: DC | PRN

## 2023-02-20 MED ORDER — LACTATED RINGERS IV BOLUS: 250.0000 mL | Freq: Once | INTRAVENOUS | Status: DC

## 2023-02-20 MED ORDER — ACETAMINOPHEN 10 MG/ML IV SOLN
1000.0000 mg | Freq: Once | INTRAVENOUS | Status: DC | PRN
Start: 2023-02-20 — End: 2023-02-20

## 2023-02-20 MED ORDER — TRANEXAMIC ACID-NACL 1000-0.7 MG/100ML-% IV SOLN
1000.0000 mg | INTRAVENOUS | Status: AC
  Administered 2023-02-20: 1000 mg via INTRAVENOUS
  Filled 2023-02-20: qty 100

## 2023-02-20 MED ORDER — FENTANYL CITRATE (PF) 100 MCG/2ML IJ SOLN
INTRAMUSCULAR | Status: AC
  Filled 2023-02-20: qty 2

## 2023-02-20 MED ORDER — LIDOCAINE HCL (CARDIAC) PF 100 MG/5ML IV SOSY
PREFILLED_SYRINGE | INTRAVENOUS | Status: DC | PRN
  Administered 2023-02-20: 50 mg via INTRAVENOUS

## 2023-02-20 MED ORDER — METHOCARBAMOL 1000 MG/10ML IJ SOLN: 500.0000 mg | Freq: Four times a day (QID) | INTRAMUSCULAR | Status: DC | PRN

## 2023-02-20 MED ORDER — GABAPENTIN 300 MG PO CAPS
300.0000 mg | ORAL_CAPSULE | Freq: Once | ORAL | Status: AC
  Administered 2023-02-20: 300 mg via ORAL
  Filled 2023-02-20: qty 1

## 2023-02-20 MED ORDER — CEFAZOLIN SODIUM-DEXTROSE 2-4 GM/100ML-% IV SOLN
2.0000 g | INTRAVENOUS | Status: AC
  Administered 2023-02-20: 2 g via INTRAVENOUS
  Filled 2023-02-20: qty 100

## 2023-02-20 MED ORDER — DROPERIDOL 2.5 MG/ML IJ SOLN
INTRAMUSCULAR | Status: AC
  Administered 2023-02-20: 0.625 mg
  Filled 2023-02-20: qty 2

## 2023-02-20 MED ORDER — ONDANSETRON HCL 4 MG/2ML IJ SOLN
INTRAMUSCULAR | Status: AC
  Filled 2023-02-20: qty 2

## 2023-02-20 MED ORDER — ONDANSETRON HCL 4 MG/2ML IJ SOLN
INTRAMUSCULAR | Status: AC
Start: 2023-02-20 — End: ?
  Filled 2023-02-20: qty 2

## 2023-02-20 MED ORDER — ASPIRIN 81 MG PO TBEC
81.0000 mg | DELAYED_RELEASE_TABLET | Freq: Two times a day (BID) | ORAL | 0 refills | Status: DC
Start: 2023-02-20 — End: 2023-05-22
  Filled 2023-02-20: qty 60, 30d supply, fill #0

## 2023-02-20 MED ORDER — PHENYLEPHRINE HCL (PRESSORS) 10 MG/ML IV SOLN
INTRAVENOUS | Status: DC | PRN
  Administered 2023-02-20: 80 ug via INTRAVENOUS
  Administered 2023-02-20 (×3): 160 ug via INTRAVENOUS

## 2023-02-20 MED ORDER — SODIUM CHLORIDE 0.9 % IR SOLN
Status: DC | PRN
  Administered 2023-02-20: 1000 mL

## 2023-02-20 MED ORDER — METHOCARBAMOL 500 MG PO TABS: 500.0000 mg | ORAL_TABLET | Freq: Four times a day (QID) | ORAL | Status: DC | PRN

## 2023-02-20 MED ORDER — MIDAZOLAM HCL 2 MG/2ML IJ SOLN
INTRAMUSCULAR | Status: AC
  Filled 2023-02-20: qty 2

## 2023-02-20 MED ORDER — PROPOFOL 500 MG/50ML IV EMUL
INTRAVENOUS | Status: DC | PRN
  Administered 2023-02-20: 100 ug/kg/min via INTRAVENOUS

## 2023-02-20 MED ORDER — BUPIVACAINE LIPOSOME 1.3 % IJ SUSP
20.0000 mL | Freq: Once | INTRAMUSCULAR | Status: DC
Start: 2023-02-20 — End: 2023-02-20

## 2023-02-20 MED ORDER — ROPIVACAINE HCL 5 MG/ML IJ SOLN
INTRAMUSCULAR | Status: DC | PRN
  Administered 2023-02-20: 30 mL via EPIDURAL

## 2023-02-20 MED ORDER — ONDANSETRON HCL 4 MG PO TABS: 4.0000 mg | ORAL_TABLET | Freq: Four times a day (QID) | ORAL | Status: DC | PRN

## 2023-02-20 MED ORDER — EPHEDRINE 5 MG/ML INJ
INTRAVENOUS | Status: AC
  Filled 2023-02-20: qty 5

## 2023-02-20 MED ORDER — FENTANYL CITRATE PF 50 MCG/ML IJ SOSY: 25.0000 ug | PREFILLED_SYRINGE | INTRAMUSCULAR | Status: DC | PRN

## 2023-02-20 MED ORDER — PHENYLEPHRINE HCL-NACL 20-0.9 MG/250ML-% IV SOLN
INTRAVENOUS | Status: DC | PRN
  Administered 2023-02-20: 45 ug/min via INTRAVENOUS

## 2023-02-20 MED ORDER — ONDANSETRON HCL 4 MG/2ML IJ SOLN
INTRAMUSCULAR | Status: DC | PRN
  Administered 2023-02-20: 4 mg via INTRAVENOUS

## 2023-02-20 MED ORDER — HYDROMORPHONE HCL 1 MG/ML IJ SOLN
INTRAMUSCULAR | Status: AC
  Administered 2023-02-20: 1 mg via INTRAVENOUS
  Filled 2023-02-20: qty 1

## 2023-02-20 MED ORDER — SODIUM CHLORIDE (PF) 0.9 % IJ SOLN
INTRAMUSCULAR | Status: DC | PRN
  Administered 2023-02-20: 70 mL

## 2023-02-20 MED ORDER — ACETAMINOPHEN 500 MG PO TABS
ORAL_TABLET | ORAL | Status: AC
  Administered 2023-02-20: 1000 mg
  Filled 2023-02-20: qty 2

## 2023-02-20 MED ORDER — FENTANYL CITRATE (PF) 100 MCG/2ML IJ SOLN
INTRAMUSCULAR | Status: DC | PRN
  Administered 2023-02-20 (×2): 50 ug via INTRAVENOUS

## 2023-02-20 MED ORDER — LACTATED RINGERS IV SOLN: INTRAVENOUS | Status: DC

## 2023-02-20 MED ORDER — OXYCODONE HCL 5 MG PO TABS
ORAL_TABLET | ORAL | Status: AC
  Filled 2023-02-20: qty 1

## 2023-02-20 MED ORDER — ALBUMIN HUMAN 5 % IV SOLN
INTRAVENOUS | Status: AC
  Filled 2023-02-20: qty 250

## 2023-02-20 MED ORDER — BUPIVACAINE LIPOSOME 1.3 % IJ SUSP
INTRAMUSCULAR | Status: AC
  Filled 2023-02-20: qty 20

## 2023-02-20 MED ORDER — ACETAMINOPHEN 500 MG PO TABS
1000.0000 mg | ORAL_TABLET | Freq: Once | ORAL | Status: AC
  Administered 2023-02-20: 1000 mg via ORAL
  Filled 2023-02-20: qty 2

## 2023-02-20 SURGICAL SUPPLY — 54 items
ARTISURF 10M VER 10-12 GH KNEE (Knees) IMPLANT
BAG COUNTER SPONGE SURGICOUNT (BAG) IMPLANT
BAG SPEC THK2 15X12 ZIP CLS (MISCELLANEOUS) ×1
BAG SPNG CNTER NS LX DISP (BAG)
BAG ZIPLOCK 12X15 (MISCELLANEOUS) ×1 IMPLANT
BLADE SAGITTAL 13X1.27X60 (BLADE) ×1 IMPLANT
BLADE SAW SGTL 18X1.27X75 (BLADE) ×1 IMPLANT
BLADE SURG 15 STRL LF DISP TIS (BLADE) ×1 IMPLANT
BLADE SURG 15 STRL SS (BLADE) ×1
BNDG CMPR 6 X 5 YARDS HK CLSR (GAUZE/BANDAGES/DRESSINGS) ×1
BNDG ELASTIC 6INX 5YD STR LF (GAUZE/BANDAGES/DRESSINGS) ×1 IMPLANT
BOWL SMART MIX CTS (DISPOSABLE) ×1 IMPLANT
BSPLAT TIB 5D H CMNT STM RT (Knees) ×1 IMPLANT
CEMENT BONE R 1X40 (Cement) ×2 IMPLANT
COVER SURGICAL LIGHT HANDLE (MISCELLANEOUS) ×1 IMPLANT
CUFF TOURN SGL QUICK 34 (TOURNIQUET CUFF) ×1
CUFF TRNQT CYL 34X4.125X (TOURNIQUET CUFF) ×1 IMPLANT
DRAPE INCISE IOBAN 66X45 STRL (DRAPES) ×2 IMPLANT
DRAPE U-SHAPE 47X51 STRL (DRAPES) ×1 IMPLANT
DRSG AQUACEL AG ADV 3.5X10 (GAUZE/BANDAGES/DRESSINGS) ×1 IMPLANT
DURAPREP 26ML APPLICATOR (WOUND CARE) ×2 IMPLANT
ELECT REM PT RETURN 15FT ADLT (MISCELLANEOUS) ×1 IMPLANT
FEMUR CMT CCR STD SZ11 R KNEE (Knees) ×1 IMPLANT
FEMUR CMTD CCR STD SZ11 R KNEE (Knees) IMPLANT
GLOVE BIOGEL M 7.0 STRL (GLOVE) IMPLANT
GLOVE BIOGEL PI IND STRL 7.5 (GLOVE) IMPLANT
GLOVE BIOGEL PI IND STRL 8.5 (GLOVE) ×1 IMPLANT
GLOVE SURG ORTHO 8.0 STRL STRW (GLOVE) ×2 IMPLANT
GOWN STRL REUS W/ TWL XL LVL3 (GOWN DISPOSABLE) ×2 IMPLANT
GOWN STRL REUS W/TWL XL LVL3 (GOWN DISPOSABLE) ×2
HANDPIECE INTERPULSE COAX TIP (DISPOSABLE) ×1
HOLDER FOLEY CATH W/STRAP (MISCELLANEOUS) ×1 IMPLANT
HOOD PEEL AWAY T7 (MISCELLANEOUS) ×3 IMPLANT
KIT TURNOVER KIT A (KITS) IMPLANT
MANIFOLD NEPTUNE II (INSTRUMENTS) ×1 IMPLANT
NS IRRIG 1000ML POUR BTL (IV SOLUTION) ×1 IMPLANT
PACK TOTAL KNEE CUSTOM (KITS) ×1 IMPLANT
PROTECTOR NERVE ULNAR (MISCELLANEOUS) ×1 IMPLANT
SET HNDPC FAN SPRY TIP SCT (DISPOSABLE) ×1 IMPLANT
SPIKE FLUID TRANSFER (MISCELLANEOUS) ×2 IMPLANT
STEM POLY PAT PLY 38M KNEE (Knees) IMPLANT
STEM TIBIA 5 DEG SZ H R KNEE (Knees) IMPLANT
STRIP CLOSURE SKIN 1/2X4 (GAUZE/BANDAGES/DRESSINGS) ×1 IMPLANT
SUT BONE WAX W31G (SUTURE) ×1 IMPLANT
SUT MNCRL AB 3-0 PS2 18 (SUTURE) ×1 IMPLANT
SUT STRATAFIX 0 PDS 27 VIOLET (SUTURE) ×1
SUT STRATAFIX 1PDS 45CM VIOLET (SUTURE) ×1 IMPLANT
SUT VIC AB 1 CT1 36 (SUTURE) ×1 IMPLANT
SUTURE STRATFX 0 PDS 27 VIOLET (SUTURE) ×1 IMPLANT
TIBIA STEM 5 DEG SZ H R KNEE (Knees) ×1 IMPLANT
TRAY FOLEY MTR SLVR 16FR STAT (SET/KITS/TRAYS/PACK) ×1 IMPLANT
TUBE SUCTION HIGH CAP CLEAR NV (SUCTIONS) ×1 IMPLANT
WATER STERILE IRR 1000ML POUR (IV SOLUTION) ×2 IMPLANT
WRAP KNEE MAXI GEL POST OP (GAUZE/BANDAGES/DRESSINGS) ×2 IMPLANT

## 2023-02-20 NOTE — Op Note (Signed)
TOTAL KNEE REPLACEMENT OPERATIVE NOTE:  02/20/2023  11:14 AM  PATIENT:  Mason [name]  57 y.o. male  PRE-OPERATIVE DIAGNOSIS:  Osteoarthritis of right knee  M17.11  POST-OPERATIVE DIAGNOSIS:  Osteoarthritis of right knee  PROCEDURE:  Procedure(s): TOTAL KNEE ARTHROPLASTY  SURGEON:  Surgeon(s): Dannielle Huh, MD  PHYSICIAN ASSISTANT: Skip Mayer, PA-C  ANESTHESIA:   spinal  SPECIMEN: None  COUNTS:  Correct  TOURNIQUET:   Total Tourniquet Time Documented: Thigh (Right) - 45 minutes Total: Thigh (Right) - 45 minutes   DICTATION:  Indication for procedure:    The patient is a 57 y.o. male who has failed conservative treatment for Osteoarthritis of right knee  M17.11.  Informed consent was obtained prior to anesthesia. The risks versus benefits of the operation were explain and in a way the patient can, and did, understand.     Description of procedure:     The patient was taken to the operating room and placed under anesthesia.  The patient was positioned in the usual fashion taking care that all body parts were adequately padded and/or protected.  A tourniquet was applied and the leg prepped and draped in the usual sterile fashion.  The extremity was exsanguinated with the esmarch and tourniquet inflated to 300 mmHg.  Pre-operative range of motion was normal.    A midline incision approximately 6-7 inches long was made with a #10 blade.  A new blade was used to make a parapatellar arthrotomy going 2-3 cm into the quadriceps tendon, over the patella, and alongside the medial aspect of the patellar tendon.  A synovectomy was then performed with the #10 blade and forceps. I then elevated the deep MCL off the medial tibial metaphysis subperiosteally around to the semimembranosus attachment.    I everted the patella and used calipers to measure patellar thickness.  I used the reamer to ream down to appropriate thickness to recreate the native thickness.  I then removed  excess bone with the rongeur and sagittal saw.  I used the appropriately sized template and drilled the three lug holes.  I then put the trial in place and measured the thickness with the calipers to ensure recreation of the native thickness.  The trial was then removed and the patella subluxed and the knee brought into flexion.  A homan retractor was place to retract and protect the patella and lateral structures.  A Z-retractor was place medially to protect the medial structures.  The extra-medullary alignment system was used to make cut the tibial articular surface perpendicular to the anamotic axis of the tibia and in 3 degrees of posterior slope.  The cut surface and alignment jig was removed.  I then used the intramedullary alignment guide to make a 4 valgus cut on the distal femur.  I then marked out the epicondylar axis on the distal femur.  I then used the anterior referencing sizer and measured the femur to be a size  11 .  The 4-In-1 cutting block was screwed into place in external rotation matching the posterior condylar angle, making our cuts perpendicular to the epicondylar axis.  Anterior, posterior and chamfer cuts were made with the sagittal saw.  The cutting block and cut pieces were removed.  A lamina spreader was placed in 90 degrees of flexion.  The ACL, PCL, menisci, and posterior condylar osteophytes were removed.  A 10 mm spacer blocked was found to offer good flexion and extension gap balance after minimal in degree releasing.   The scoop  retractor was then placed and the femoral finishing block was pinned in place.  The small sagittal saw was used as well as the lug drill to finish the femur.  The block and cut surfaces were removed and the medullary canal hole filled with autograft bone from the cut pieces.  The tibia was delivered forward in deep flexion and external rotation.  A size H tray was selected and pinned into place centered on the medial 1/3 of the tibial tubercle.  The  reamer and keel was used to prepare the tibia through the tray.    I then trialed with the size  11  femur, size H tibia, a 10 mm insert and the 38 patella.  I had excellent flexion/extension gap balance, excellent patella tracking.  Flexion was full and beyond 120 degrees; extension was zero.  These components were chosen and the staff opened them to me on the back table while the knee was lavaged copiously and the cement mixed.  The soft tissue was infiltrated with 60cc of exparel 1.3% through a 21 gauge needle.  I cemented in the components and removed all excess cement.  The polyethylene tibial component was snapped into place and the knee placed in extension while cement was hardening.  The capsule was infilltrated with a 60cc exparel/marcaine/saline mixture.   Once the cement was hard, the tourniquet was let down.  Hemostasis was obtained.  The arthrotomy was closed using a #1 stratofix running suture.  The deep soft tissues were closed with #0 vicryls and the subcuticular layer closed with #2-0 vicryl.  The skin was reapproximated and closed with 3.0 Monocryl.  The wound was covered with steristrips, aquacel dressing, and a TED stocking.   The patient was then awakened, extubated, and taken to the recovery room in stable condition.  BLOOD LOSS:  300cc COMPLICATIONS:  None.  PLAN OF CARE: Discharge to home after PACU  PATIENT DISPOSITION:  PACU - hemodynamically stable.   Delay start of Pharmacological VTE agent (>24hrs) due to surgical blood loss or risk of bleeding:  yes  Please fax a copy of this op note to my office at [phone] (please only include page 1 and 2 of the Case Information op note)

## 2023-02-20 NOTE — H&P (Signed)
Mason [name] MRN:  [id number] DOB/SEX:  23-Mar-1966/male  CHIEF COMPLAINT:  Painful right Knee  HISTORY: Patient is a 57 y.o. male presented with a history of pain in the right knee. Onset of symptoms was gradual starting a few years ago with gradually worsening course since that time. Patient has been treated conservatively with over-the-counter NSAIDs and activity modification. Patient currently rates pain in the knee at 10 out of 10 with activity. There is pain at night.  PAST MEDICAL HISTORY: Patient Active Problem List   Diagnosis Date Noted   Vitamin B12 deficiency 01/07/2022   Deficiency anemia 01/06/2022   Psoriasis 01/06/2022   Preventive measure 01/01/2020   COVID-19 virus infection 12/06/2019   Major depression, recurrent, chronic (HCC) 11/29/2019   Elevated liver enzymes 11/29/2019   Oral lesion 04/04/2019   Port-A-Cath in place 05/28/2018   Anxiety disorder due to medical condition 03/26/2018   Goals of care, counseling/discussion 01/29/2018   Insomnia disorder 01/29/2018   Drug-induced skin rash 01/17/2018   Yeast infection 01/16/2018   CHF (congestive heart failure) (HCC) 10/30/2017   Elevated LFTs    Restrictive lung disease 10/09/2017   Emphysema of lung (HCC) 09/18/2017   Dyspnea and respiratory abnormalities 07/13/2017   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   Acquired hypothyroidism 09/06/2016   Encounter for antineoplastic chemotherapy 08/09/2016   Debility 07/15/2016   Cough 06/17/2016   Nasal congestion 06/17/2016   Elevated BP without diagnosis of hypertension 06/17/2016   Poor dentition 05/20/2016   Drug-induced pneumonitis 02/29/2016   Sinus congestion 02/08/2016   Weight loss, non-intentional 01/18/2016   Dysuria 09/04/2015   Palliative care by specialist 08/05/2015   Insomnia 06/15/2015   Acute gout involving toe of right foot 05/25/2015   Acute sinusitis, unspecified 05/18/2015   Pancytopenia due to antineoplastic  chemotherapy (HCC) 04/27/2015   Cancer associated pain 02/12/2015   Lung nodule, multiple 12/30/2014   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   CKD (chronic kidney disease), stage III (HCC) 12/02/2014   Abdominal wall pain in left upper quadrant 09/30/2014   Acquired dysphasia 07/30/2014   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   Lymphedema of face 06/27/2014   Dysphagia, oropharyngeal 06/27/2014   Neck stiffness 06/27/2014   Throat pain in adult 02/19/2014   Dehydration 10/23/2013   Anemia in neoplastic disease 10/14/2013   Leukopenia due to antineoplastic chemotherapy (HCC) 10/14/2013   Renal insufficiency 10/14/2013   Nausea & vomiting 09/12/2013   Chronic periodontitis 08/01/2013   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   Past Medical History:  Diagnosis Date   Abnormal liver function test    Acute sinusitis, unspecified 05/18/2015   Anemia    Anxiety    mild new dx   Arthritis    knees,hips   Bilateral edema of lower extremity    Complication of anesthesia    Pt stated " my oxygen level was slow in rising."   Concussion    Hx: in high school x 2   Constipation    Depression    Dysuria 09/04/2015   Fever    Hyperactive gag reflex    Hypertension    Hypoglycemia    Hypokalemia    Hypothyroidism    Insomnia 06/15/2015   Knee pain, chronic    Malnutrition (HCC)    Non-healing surgical wound 05/23/2014   PEG (percutaneous endoscopic gastrostomy) status (HCC)    Renal failure, acute (HCC)    S/P radiation therapy 08/19/2013-10/15/2013   Right Tonstil and bilateral  neck / 70 Gy in 35 fractions to gross disease, 63 Gy in 35 fractions to high risk nodal echelons, and 56 Gy in 35 fractions to intermediate risk nodal echelons   Severe nausea and vomiting    Status post chemotherapy    Only received 2 doses due to uncontrolled nausea and acute renal failure   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, recurrent 2016   Vitamin B12 deficiency  01/07/2022   Past Surgical History:  Procedure Laterality Date   IR REMOVAL TUN ACCESS W/ PORT W/O FL MOD SED  02/11/2022   LAPAROSCOPIC GASTROSTOMY N/A 08/15/2013   Procedure: LAPAROSCOPIC GASTROSTOMY TUBE PLACEMENT;  Surgeon: Axel Filler, MD;  Location: [facility] OR;  Service: General;  Laterality: N/A;   LYMPH NODE BIOPSY  03/20/14   right neck   MULTIPLE EXTRACTIONS WITH ALVEOLOPLASTY N/A 08/01/2013   Procedure: Extraction of tooth #'s 1,15,17,31, 32 with alveoloplasty, mandibular left torus reduction, and gross debridement of remaining teeth.;  Surgeon: Charlynne Pander, DDS;  Location: [facility] OR;  Service: Oral Surgery;  Laterality: N/A;   PORT-A-CATH REMOVAL N/A 05/13/2014   Procedure: REMOVAL of PORT-A-CATH;  Surgeon: Axel Filler, MD;  Location: [facility] OR;  Service: General;  Laterality: N/A;   PORTACATH PLACEMENT Left 08/15/2013   Procedure: INSERTION PORT-A-CATH;  Surgeon: Axel Filler, MD;  Location: [facility] OR;  Service: General;  Laterality: Left;     MEDICATIONS:   Medications Prior to Admission  Medication Sig Dispense Refill Last Dose   Apremilast (OTEZLA) 30 MG TABS Take 30 mg by mouth in the morning and at bedtime.   02/19/2023   aspirin EC 81 MG tablet Take 81 mg by mouth daily.   Past Week   cholecalciferol (VITAMIN D3) 25 MCG (1000 UNIT) tablet Take 1,000 Units by mouth daily.   Past Week   dapagliflozin propanediol (FARXIGA) 10 MG TABS tablet Take 1 tablet (10 mg total) by mouth daily before breakfast. 90 tablet 2    isosorbide mononitrate (IMDUR) 30 MG 24 hr tablet Take 1 tablet (30 mg total) by mouth daily. 90 tablet 1 02/20/2023 at 0500   levothyroxine (SYNTHROID) 100 MCG tablet Take 1 tablet (100 mcg total) by mouth daily before breakfast. 30 tablet 5 02/20/2023 at 0500   metoprolol (TOPROL XL) 200 MG 24 hr tablet Take 1 tablet (200 mg total) by mouth daily. 90 tablet 3 02/20/2023 at 0500   nitroGLYCERIN (NITROSTAT) 0.4 MG SL tablet Place 1 tablet (0.4 mg total) under the tongue  every 5 (five) minutes as needed. 25 tablet 3    omeprazole (PRILOSEC) 20 MG capsule Take 1 capsule (20 mg total) by mouth daily. 30 capsule 2 02/20/2023 at 0500   oxyCODONE (ROXICODONE) 15 MG immediate release tablet Take 1 tablet by mouth every 6 hours as needed. 90 tablet 0 02/19/2023   sacubitril-valsartan (ENTRESTO) 49-51 MG Take 1 tablet by mouth 2 (two) times daily. 60 tablet 5 02/19/2023   simvastatin (ZOCOR) 20 MG tablet Take 1 tablet (20 mg total) by mouth at bedtime. 90 tablet 1 02/19/2023   Skin Protectants, Misc. (MINERIN CREME) CREA Apply 1 Application topically daily as needed (rash).      spironolactone (ALDACTONE) 25 MG tablet Take 0.5 tablets (12.5 mg total) by mouth daily. 45 tablet 3 02/19/2023   triamcinolone cream (KENALOG) 0.1 % Apply 1 Application topically daily as needed (rash).      cyanocobalamin (VITAMIN B12) 1000 MCG/ML injection Inject 1 mL (1,000 mcg total) into the  muscle every 30 (thirty) days. (Patient taking differently: Inject 1,000 mcg into the muscle every 3 (three) months.) 10 mL 0 More than a month    ALLERGIES:   Allergies  Allergen Reactions   Bee Pollen     Watery eyes, runny nose, sneezing   Pollen Extract Other (See Comments)    Watery eyes, runny nose, sneezing    REVIEW OF SYSTEMS:  A comprehensive review of systems was negative except for: Musculoskeletal: positive for arthralgias and stiff joints   FAMILY HISTORY:   Family History  Problem Relation Age of Onset   Arthritis Mother    Heart disease Mother    Arthritis Father    CVA Other    Diabetes Other    Heart attack Other    Heart disease Maternal Grandmother    CVA Maternal Grandmother     SOCIAL HISTORY:   Social History   Tobacco Use   Smoking status: Former    Current packs/day: 0.00    Average packs/day: 1 pack/day for 20.0 years (20.0 ttl pk-yrs)    Types: Cigarettes    Start date: 07/16/1983    Quit date: 07/16/2003    Years since quitting: 19.6   Smokeless tobacco:  Never  Substance Use Topics   Alcohol use: No    Comment: none years ago     EXAMINATION:  Vital signs in last 24 hours: Temp:  [98.4 F (36.9 C)] 98.4 F (36.9 C) (11/04 1610) Pulse Rate:  [70] 70 (11/04 0621) Resp:  [16] 16 (11/04 0621) BP: (122)/(80) 122/80 (11/04 0621) SpO2:  [95 %] 95 % (11/04 0621) Weight:  [115.7 kg] 115.7 kg (11/04 0633)  BP 122/80   Pulse 70   Temp 98.4 F (36.9 C) (Oral)   Resp 16   Ht 6\' 5"  (1.956 m)   Wt 115.7 kg   SpO2 95%   BMI 30.24 kg/m   General Appearance:    Alert, cooperative, no distress, appears stated age  Head:    Normocephalic, without obvious abnormality, atraumatic  Eyes:    PERRL, conjunctiva/corneas clear, EOM's intact, fundi    benign, both eyes       Ears:    Normal TM's and external ear canals, both ears  Nose:   Nares normal, septum midline, mucosa normal, no drainage    or sinus tenderness  Throat:   Lips, mucosa, and tongue normal; teeth and gums normal  Neck:   Supple, symmetrical, trachea midline, no adenopathy;       thyroid:  No enlargement/tenderness/nodules; no carotid   bruit or JVD  Back:     Symmetric, no curvature, ROM normal, no CVA tenderness  Lungs:     Clear to auscultation bilaterally, respirations unlabored  Chest wall:    No tenderness or deformity  Heart:    Regular rate and rhythm, S1 and S2 normal, no murmur, rub   or gallop  Abdomen:     Soft, non-tender, bowel sounds active all four quadrants,    no masses, no organomegaly  Genitalia:    Normal male without lesion, discharge or tenderness  Rectal:    Normal tone, normal prostate, no masses or tenderness;   guaiac negative stool  Extremities:   Extremities normal, atraumatic, no cyanosis or edema  Pulses:   2+ and symmetric all extremities  Skin:   Skin color, texture, turgor normal, no rashes or lesions  Lymph nodes:   Cervical, supraclavicular, and axillary nodes normal  Neurologic:   CNII-XII intact.  Normal strength, sensation and reflexes       throughout    Musculoskeletal:  ROM 0-120, Ligaments intact,  Imaging Review Plain radiographs demonstrate severe degenerative joint disease of the right knee. The overall alignment is neutral. The bone quality appears to be good for age and reported activity level.  Assessment/Plan: Primary osteoarthritis, right knee   The patient history, physical examination and imaging studies are consistent with advanced degenerative joint disease of the right knee. The patient has failed conservative treatment.  The clearance notes were reviewed.  After discussion with the patient it was felt that Total Knee Replacement was indicated. The procedure,  risks, and benefits of total knee arthroplasty were presented and reviewed. The risks including but not limited to aseptic loosening, infection, blood clots, vascular injury, stiffness, patella tracking problems complications among others were discussed. The patient acknowledged the explanation, agreed to proceed with the plan.  Preoperative templating of the joint replacement has been completed, documented, and submitted to the Operating Room personnel in order to optimize intra-operative equipment management.    Patient's anticipated LOS is less than 2 midnights, meeting these requirements: - Younger than 13 - Lives within 1 hour of care - Has a competent adult at home to recover with post-op recover - NO history of  - Chronic pain requiring opiods  - Diabetes  - Coronary Artery Disease  - Heart failure  - Heart attack  - Stroke  - DVT/VTE  - Cardiac arrhythmia  - Respiratory Failure/COPD  - Renal failure  - Anemia  - Advanced Liver disease     Guy Sandifer 02/20/2023, 7:04 AM

## 2023-02-20 NOTE — Anesthesia Procedure Notes (Signed)
Anesthesia Regional Block: Adductor canal block   Pre-Anesthetic Checklist: , timeout performed,  Correct Patient, Correct Site, Correct Laterality,  Correct Procedure, Correct Position, site marked,  Risks and benefits discussed,  Surgical consent,  Pre-op evaluation,  At surgeon's request and post-op pain management  Laterality: Right  Prep: Maximum Sterile Barrier Precautions used, chloraprep       Needles:  Injection technique: Single-shot  Needle Type: Echogenic Needle      Needle Gauge: 20     Additional Needles:   Procedures:,,,, ultrasound used (permanent image in chart),,    Narrative:  Start time: 02/20/2023 8:00 AM End time: 02/20/2023 8:04 AM Injection made incrementally with aspirations every 5 mL.  Performed by: Personally  Anesthesiologist: Mariann Barter, MD

## 2023-02-20 NOTE — Transfer of Care (Signed)
Immediate Anesthesia Transfer of Care Note  Patient: Mason [name]  Procedure(s) Performed: TOTAL KNEE ARTHROPLASTY (Right: Knee)  Patient Location: PACU  Anesthesia Type:MAC and Spinal  Level of Consciousness: awake, alert , and oriented  Airway & Oxygen Therapy: Patient Spontanous Breathing and Patient connected to face mask oxygen  Post-op Assessment: Report given to RN and Post -op Vital signs reviewed and stable  Post vital signs: Reviewed and stable  Last Vitals:  Vitals Value Taken Time  BP 95/65 02/20/23 1027  Temp    Pulse 92 02/20/23 1030  Resp 18 02/20/23 1030  SpO2 93 % 02/20/23 1030  Vitals shown include unfiled device data.  Last Pain:  Vitals:   02/20/23 0800  TempSrc:   PainSc: 0-No pain         Complications: No notable events documented.

## 2023-02-20 NOTE — Progress Notes (Signed)
Orthopedic Tech Progress Note Patient Details:  Mason [name] 01-13-1966 [id number]  Ortho Devices Type of Ortho Device: Bone foam zero knee Ortho Device/Splint Interventions: Ordered      [place] 02/20/2023, 10:27 AM

## 2023-02-20 NOTE — Evaluation (Signed)
Physical Therapy Evaluation Patient Details Name: Mason [name] MRN: [id number] DOB: 05/06/1965 Today's Date: 02/20/2023  History of Present Illness  57 yo male presents to therapy s/p R TKA on 02/20/2023 due to failure of conservative measures. Pt PMH includes but is not limited to: anemia, CAD, CHF, emphysema, DOE, lung nodules, HOH R ear, CKD III, and tonsillar cancer s/p radiation.  Clinical Impression     Mason [name] is a 57 y.o. male POD 0 s/p R TKA. Patient reports IND with mobility at baseline. p L TKA. Patient reports IND with occasional use of SPC for prolonged distances with mobility at baseline. Patient is now limited by functional impairments (see PT problem list below) and requires CGA and ceus for transfers and gait with RW. Patient was able to ambulate 45 feet x 2 with RW and CGA and cues for safe walker management. Patient educated on safe sequencing for stair mobility with L handrail, fall risk prevention, pain management and goals, use of CP/ice, car transfer pt and spouse verbalized understanding of safe guarding position for people assisting with mobility. Patient instructed in exercises to facilitate ROM and circulation reviewed and HO provided. Patient will benefit from continued skilled PT interventions to address impairments and progress towards PLOF. Patient has met mobility goals at adequate level for discharge home with spouse and OPPT services scheduled for 11/14, CPM in home setting; will continue to follow if pt continues acute stay to progress towards Mod I goals.      If plan is discharge home, recommend the following: A little help with walking and/or transfers;A little help with bathing/dressing/bathroom;Assistance with cooking/housework;Assist for transportation;Help with stairs or ramp for entrance   Can travel by private vehicle        Equipment Recommendations None recommended by PT  Recommendations for Other Services       Functional Status  Assessment Patient has had a recent decline in their functional status and demonstrates the ability to make significant improvements in function in a reasonable and predictable amount of time.     Precautions / Restrictions Precautions Precautions: Knee;Fall Restrictions Weight Bearing Restrictions: No      Mobility  Bed Mobility Overal bed mobility: Needs Assistance Bed Mobility: Supine to Sit     Supine to sit: Supervision, HOB elevated, Used rails     General bed mobility comments: min cues    Transfers Overall transfer level: Needs assistance Equipment used: Rolling walker (2 wheels) Transfers: Sit to/from Stand Sit to Stand: Contact guard assist, From elevated surface           General transfer comment: min cues    Ambulation/Gait Ambulation/Gait assistance: Contact guard assist Gait Distance (Feet): 45 Feet Assistive device: Rolling walker (2 wheels) Gait Pattern/deviations: Step-to pattern, Antalgic, Trunk flexed Gait velocity: decreased     General Gait Details: heavy reliance on B UE support at RW to offload R LE in stance phase, min cues for safety and sequencing  Stairs Stairs: Yes Stairs assistance: Contact guard assist Stair Management: One rail Left Number of Stairs: 2 General stair comments: first bout wtih step navigation education B handrails and pt able perform proper sequencing and technique and able to progress to B UE on L handrail with CGA and min ceus  Wheelchair Mobility     Tilt Bed    Modified Rankin (Stroke Patients Only)       Balance Overall balance assessment: Needs assistance Sitting-balance support: Feet supported Sitting balance-Leahy Scale: Good  Standing balance support: Bilateral upper extremity supported, During functional activity, Reliant on assistive device for balance Standing balance-Leahy Scale: Fair Standing balance comment: static standing no UE support                              Pertinent Vitals/Pain Pain Assessment Pain Assessment: 0-10 Pain Score: 5  Pain Location: R knee Pain Descriptors / Indicators: Aching, Constant, Discomfort, Grimacing, Operative site guarding Pain Intervention(s): Limited activity within patient's tolerance, Monitored during session, Premedicated before session, Repositioned, Ice applied    Home Living Family/patient expects to be discharged to:: Private residence Living Arrangements: Spouse/significant other Available Help at Discharge: Family Type of Home: Mobile home Home Access: Stairs to enter Entrance Stairs-Rails: Left Entrance Stairs-Number of Steps: 4   Home Layout: One level Home Equipment: Agricultural consultant (2 wheels);Toilet riser Additional Comments: knee braces    Prior Function Prior Level of Function : Independent/Modified Independent             Mobility Comments: IND with all ADLs, self care tasks and IADLs       Extremity/Trunk Assessment        Lower Extremity Assessment Lower Extremity Assessment: RLE deficits/detail RLE Deficits / Details: ankle DF/PF 5/5; SLR < 10 degree lag RLE Sensation: WNL    Cervical / Trunk Assessment Cervical / Trunk Assessment: Normal  Communication   Communication Communication: No apparent difficulties  Cognition Arousal: Alert Behavior During Therapy: WFL for tasks assessed/performed Overall Cognitive Status: Within Functional Limits for tasks assessed                                          General Comments      Exercises Total Joint Exercises Ankle Circles/Pumps: AROM, Both, 10 reps Quad Sets: AROM, 5 reps, Right Short Arc Quad: AROM, Right, 5 reps Heel Slides: AROM, Right, 5 reps Hip ABduction/ADduction: AROM, Right, 5 reps Straight Leg Raises: AROM, Right, 5 reps Knee Flexion: AROM, Right, 5 reps, Seated   Assessment/Plan    PT Assessment Patient needs continued PT services  PT Problem List Decreased strength;Decreased  activity tolerance;Decreased range of motion;Decreased balance;Decreased mobility;Decreased coordination;Pain       PT Treatment Interventions DME instruction;Gait training;Stair training;Functional mobility training;Therapeutic activities;Therapeutic exercise;Balance training;Neuromuscular re-education;Patient/family education;Modalities    PT Goals (Current goals can be found in the Care Plan section)  Acute Rehab PT Goals Patient Stated Goal: to go back to work, walk longer distances PT Goal Formulation: With patient Time For Goal Achievement: 03/06/23 Potential to Achieve Goals: Good    Frequency 7X/week     Co-evaluation               AM-PAC PT "6 Clicks" Mobility  Outcome Measure Help needed turning from your back to your side while in a flat bed without using bedrails?: None Help needed moving from lying on your back to sitting on the side of a flat bed without using bedrails?: A Little Help needed moving to and from a bed to a chair (including a wheelchair)?: A Little Help needed standing up from a chair using your arms (e.g., wheelchair or bedside chair)?: A Little Help needed to walk in hospital room?: A Little Help needed climbing 3-5 steps with a railing? : A Little 6 Click Score: 19    End of Session Equipment Utilized During Treatment:  Gait belt Activity Tolerance: Patient tolerated treatment well;No increased pain Patient left: in chair;with call bell/phone within reach;with nursing/sitter in room;with family/visitor present Nurse Communication: Mobility status;Other (comment) (pt readiness for d/c from PT standpoint) PT Visit Diagnosis: Unsteadiness on feet (R26.81);Other abnormalities of gait and mobility (R26.89);Muscle weakness (generalized) (M62.81);Difficulty in walking, not elsewhere classified (R26.2);Pain Pain - Right/Left: Right Pain - part of body: Knee;Leg    Time: 1610-9604 PT Time Calculation (min) (ACUTE ONLY): 27 min   Charges:   PT  Evaluation $PT Eval Low Complexity: 1 Low PT Treatments $Gait Training: 8-22 mins PT General Charges $$ ACUTE PT VISIT: 1 Visit         Johnny Bridge, PT Acute Rehab   Jacqualyn Posey 02/20/2023, 6:03 PM

## 2023-02-20 NOTE — Anesthesia Postprocedure Evaluation (Signed)
Anesthesia Post Note  Patient: Mason [name]  Procedure(s) Performed: TOTAL KNEE ARTHROPLASTY (Right: Knee)     Patient location during evaluation: PACU Anesthesia Type: Spinal Level of consciousness: oriented and awake and alert Pain management: pain level controlled Vital Signs Assessment: post-procedure vital signs reviewed and stable Respiratory status: spontaneous breathing, respiratory function stable and patient connected to nasal cannula oxygen Cardiovascular status: blood pressure returned to baseline and stable Postop Assessment: no headache, no backache and no apparent nausea or vomiting Anesthetic complications: no   No notable events documented.  Last Vitals:  Vitals:   02/20/23 1215 02/20/23 1245  BP: 110/78 115/89  Pulse: 67 79  Resp: 16 12  Temp: 36.6 C (!) 36.2 C  SpO2: 100% 96%    Last Pain:  Vitals:   02/20/23 1200  TempSrc:   PainSc: 0-No pain                 Mariann Barter

## 2023-02-20 NOTE — Anesthesia Procedure Notes (Signed)
Spinal  Patient location during procedure: OR Start time: 02/20/2023 8:27 AM End time: 02/20/2023 8:30 AM Reason for block: surgical anesthesia Staffing Performed: anesthesiologist  Anesthesiologist: Mariann Barter, MD Performed by: Mariann Barter, MD Authorized by: Mariann Barter, MD   Preanesthetic Checklist Completed: patient identified, IV checked, site marked, risks and benefits discussed, surgical consent, monitors and equipment checked, pre-op evaluation and timeout performed Spinal Block Patient position: sitting Prep: DuraPrep Patient monitoring: heart rate, cardiac monitor, continuous pulse ox and blood pressure Approach: midline Location: L3-4 Injection technique: single-shot Needle Needle type: Sprotte  Needle gauge: 24 G Needle length: 9 cm Assessment Sensory level: T4 Events: CSF return

## 2023-02-21 ENCOUNTER — Encounter (HOSPITAL_COMMUNITY): Payer: Self-pay | Admitting: Orthopedic Surgery

## 2023-02-21 ENCOUNTER — Other Ambulatory Visit: Payer: Self-pay

## 2023-02-21 ENCOUNTER — Other Ambulatory Visit: Payer: Self-pay | Admitting: Hematology and Oncology

## 2023-02-21 ENCOUNTER — Other Ambulatory Visit (HOSPITAL_COMMUNITY): Payer: Self-pay

## 2023-02-22 ENCOUNTER — Other Ambulatory Visit (HOSPITAL_COMMUNITY): Payer: Self-pay

## 2023-02-22 MED ORDER — OXYCODONE HCL 15 MG PO TABS
15.0000 mg | ORAL_TABLET | Freq: Four times a day (QID) | ORAL | 0 refills | Status: DC | PRN
  Filled 2023-02-22 – 2023-02-27 (×4): qty 90, 23d supply, fill #0

## 2023-02-23 ENCOUNTER — Other Ambulatory Visit (HOSPITAL_COMMUNITY): Payer: Self-pay

## 2023-02-23 ENCOUNTER — Encounter (HOSPITAL_COMMUNITY): Payer: Self-pay

## 2023-02-24 ENCOUNTER — Other Ambulatory Visit (HOSPITAL_COMMUNITY): Payer: Self-pay

## 2023-02-27 ENCOUNTER — Other Ambulatory Visit (HOSPITAL_COMMUNITY): Payer: Self-pay

## 2023-03-10 ENCOUNTER — Ambulatory Visit: Payer: Medicaid Other | Attending: Nurse Practitioner

## 2023-03-10 DIAGNOSIS — E782 Mixed hyperlipidemia: Secondary | ICD-10-CM

## 2023-03-10 DIAGNOSIS — R072 Precordial pain: Secondary | ICD-10-CM

## 2023-03-10 DIAGNOSIS — I5022 Chronic systolic (congestive) heart failure: Secondary | ICD-10-CM

## 2023-03-10 DIAGNOSIS — C099 Malignant neoplasm of tonsil, unspecified: Secondary | ICD-10-CM

## 2023-03-10 DIAGNOSIS — I10 Essential (primary) hypertension: Secondary | ICD-10-CM

## 2023-03-13 ENCOUNTER — Other Ambulatory Visit (HOSPITAL_COMMUNITY): Payer: Self-pay

## 2023-03-17 ENCOUNTER — Other Ambulatory Visit: Payer: Medicaid Other

## 2023-03-17 ENCOUNTER — Other Ambulatory Visit: Payer: Self-pay

## 2023-03-17 ENCOUNTER — Other Ambulatory Visit (HOSPITAL_COMMUNITY): Payer: Self-pay

## 2023-03-17 ENCOUNTER — Other Ambulatory Visit: Payer: Self-pay | Admitting: Hematology and Oncology

## 2023-03-20 ENCOUNTER — Other Ambulatory Visit (HOSPITAL_COMMUNITY): Payer: Self-pay

## 2023-03-20 MED ORDER — OXYCODONE HCL 15 MG PO TABS
15.0000 mg | ORAL_TABLET | Freq: Four times a day (QID) | ORAL | 0 refills | Status: DC | PRN
  Filled 2023-03-20: qty 90, 23d supply, fill #0

## 2023-03-24 ENCOUNTER — Other Ambulatory Visit (HOSPITAL_COMMUNITY): Payer: Self-pay

## 2023-03-29 ENCOUNTER — Encounter: Payer: Self-pay | Admitting: Internal Medicine

## 2023-03-31 ENCOUNTER — Encounter (HOSPITAL_COMMUNITY): Payer: Self-pay

## 2023-03-31 ENCOUNTER — Other Ambulatory Visit: Payer: Self-pay

## 2023-03-31 ENCOUNTER — Other Ambulatory Visit (HOSPITAL_COMMUNITY): Payer: Self-pay

## 2023-04-09 ENCOUNTER — Other Ambulatory Visit: Payer: Self-pay | Admitting: Hematology and Oncology

## 2023-04-10 ENCOUNTER — Other Ambulatory Visit (HOSPITAL_COMMUNITY): Payer: Self-pay

## 2023-04-10 ENCOUNTER — Other Ambulatory Visit: Payer: Self-pay

## 2023-04-10 MED ORDER — CYANOCOBALAMIN 1000 MCG/ML IJ SOLN
1000.0000 ug | INTRAMUSCULAR | 0 refills | Status: DC
  Filled 2023-04-10: qty 3, 90d supply, fill #0
  Filled 2023-07-05: qty 3, 90d supply, fill #1
  Filled 2023-08-13: qty 3, 90d supply, fill #2

## 2023-04-10 MED ORDER — OXYCODONE HCL 15 MG PO TABS
15.0000 mg | ORAL_TABLET | Freq: Four times a day (QID) | ORAL | 0 refills | Status: DC | PRN
  Filled 2023-04-10: qty 90, 23d supply, fill #0

## 2023-04-10 MED ORDER — LEVOTHYROXINE SODIUM 100 MCG PO TABS
100.0000 ug | ORAL_TABLET | Freq: Every day | ORAL | 5 refills | Status: DC
  Filled 2023-04-10: qty 30, 30d supply, fill #0
  Filled 2023-05-23: qty 30, 30d supply, fill #1
  Filled 2023-06-12: qty 30, 30d supply, fill #2
  Filled 2023-07-05: qty 30, 30d supply, fill #3
  Filled 2023-08-13: qty 30, 30d supply, fill #4

## 2023-04-13 ENCOUNTER — Telehealth: Payer: Self-pay

## 2023-04-13 ENCOUNTER — Other Ambulatory Visit (HOSPITAL_COMMUNITY): Payer: Self-pay

## 2023-04-13 NOTE — Telephone Encounter (Signed)
Pharmacy Patient Advocate Encounter   Received notification from CoverMyMeds that prior authorization for [facility] is required/requested.   Insurance verification completed.   The patient is insured through [facility] MEDICAID .   Per test claim: PA required; PA submitted to above mentioned insurance via [facility] Tracks Key/confirmation #/EOC [id number] W Status is pending

## 2023-04-17 ENCOUNTER — Other Ambulatory Visit (HOSPITAL_COMMUNITY): Payer: Self-pay

## 2023-04-20 NOTE — Telephone Encounter (Signed)
 Pharmacy Patient Advocate Encounter  Received notification from [facility] MEDICAID that Prior Authorization for Mason [name] has been APPROVED from 04/13/23 to 04/12/24

## 2023-04-27 ENCOUNTER — Encounter: Payer: Self-pay | Admitting: Hematology and Oncology

## 2023-04-27 ENCOUNTER — Inpatient Hospital Stay: Payer: Medicaid Other | Attending: Hematology and Oncology | Admitting: Hematology and Oncology

## 2023-04-27 ENCOUNTER — Inpatient Hospital Stay: Payer: Medicaid Other

## 2023-04-27 VITALS — BP 115/63 | HR 83 | Temp 97.8°F | Resp 16 | Ht 77.0 in | Wt 253.0 lb

## 2023-04-27 DIAGNOSIS — K760 Fatty (change of) liver, not elsewhere classified: Secondary | ICD-10-CM | POA: Insufficient documentation

## 2023-04-27 DIAGNOSIS — I272 Pulmonary hypertension, unspecified: Secondary | ICD-10-CM | POA: Insufficient documentation

## 2023-04-27 DIAGNOSIS — I509 Heart failure, unspecified: Secondary | ICD-10-CM | POA: Insufficient documentation

## 2023-04-27 DIAGNOSIS — Z79899 Other long term (current) drug therapy: Secondary | ICD-10-CM | POA: Diagnosis not present

## 2023-04-27 DIAGNOSIS — I2510 Atherosclerotic heart disease of native coronary artery without angina pectoris: Secondary | ICD-10-CM | POA: Diagnosis not present

## 2023-04-27 DIAGNOSIS — N183 Chronic kidney disease, stage 3 unspecified: Secondary | ICD-10-CM | POA: Diagnosis not present

## 2023-04-27 DIAGNOSIS — C099 Malignant neoplasm of tonsil, unspecified: Secondary | ICD-10-CM

## 2023-04-27 DIAGNOSIS — E538 Deficiency of other specified B group vitamins: Secondary | ICD-10-CM

## 2023-04-27 DIAGNOSIS — Z79890 Hormone replacement therapy (postmenopausal): Secondary | ICD-10-CM | POA: Diagnosis not present

## 2023-04-27 DIAGNOSIS — E039 Hypothyroidism, unspecified: Secondary | ICD-10-CM

## 2023-04-27 DIAGNOSIS — Z91030 Bee allergy status: Secondary | ICD-10-CM | POA: Insufficient documentation

## 2023-04-27 DIAGNOSIS — I700 Atherosclerosis of aorta: Secondary | ICD-10-CM | POA: Diagnosis not present

## 2023-04-27 DIAGNOSIS — R070 Pain in throat: Secondary | ICD-10-CM

## 2023-04-27 DIAGNOSIS — J432 Centrilobular emphysema: Secondary | ICD-10-CM | POA: Diagnosis not present

## 2023-04-27 DIAGNOSIS — J329 Chronic sinusitis, unspecified: Secondary | ICD-10-CM | POA: Diagnosis not present

## 2023-04-27 LAB — COMPREHENSIVE METABOLIC PANEL
ALT: 13 U/L (ref 0–44)
AST: 20 U/L (ref 15–41)
Albumin: 4.2 g/dL (ref 3.5–5.0)
Alkaline Phosphatase: 63 U/L (ref 38–126)
Anion gap: 3 — ABNORMAL LOW (ref 5–15)
BUN: 12 mg/dL (ref 6–20)
CO2: 32 mmol/L (ref 22–32)
Calcium: 10 mg/dL (ref 8.9–10.3)
Chloride: 103 mmol/L (ref 98–111)
Creatinine, Ser: 1.71 mg/dL — ABNORMAL HIGH (ref 0.61–1.24)
GFR, Estimated: 47 mL/min — ABNORMAL LOW (ref >60)
Glucose, Bld: 59 mg/dL — ABNORMAL LOW (ref 70–99)
Potassium: 4.5 mmol/L (ref 3.5–5.1)
Sodium: 138 mmol/L (ref 135–145)
Total Bilirubin: 0.5 mg/dL (ref 0.0–1.2)
Total Protein: 7.3 g/dL (ref 6.5–8.1)

## 2023-04-27 LAB — CBC WITH DIFFERENTIAL/PLATELET
Abs Immature Granulocytes: 0.02 10*3/uL (ref 0.00–0.07)
Basophils Absolute: 0 10*3/uL (ref 0.0–0.1)
Basophils Relative: 0 %
Eosinophils Absolute: 0.1 10*3/uL (ref 0.0–0.5)
Eosinophils Relative: 2 %
HCT: 45 % (ref 39.0–52.0)
Hemoglobin: 14.7 g/dL (ref 13.0–17.0)
Immature Granulocytes: 0 %
Lymphocytes Relative: 15 %
Lymphs Abs: 1.1 10*3/uL (ref 0.7–4.0)
MCH: 31.3 pg (ref 26.0–34.0)
MCHC: 32.7 g/dL (ref 30.0–36.0)
MCV: 95.7 fL (ref 80.0–100.0)
Monocytes Absolute: 0.7 10*3/uL (ref 0.1–1.0)
Monocytes Relative: 9 %
Neutro Abs: 5.7 10*3/uL (ref 1.7–7.7)
Neutrophils Relative %: 74 %
Platelets: 268 10*3/uL (ref 150–400)
RBC: 4.7 MIL/uL (ref 4.22–5.81)
RDW: 12.4 % (ref 11.5–15.5)
WBC: 7.7 10*3/uL (ref 4.0–10.5)
nRBC: 0 % (ref 0.0–0.2)

## 2023-04-27 LAB — TSH: TSH: 1.665 u[IU]/mL (ref 0.350–4.500)

## 2023-04-27 NOTE — Progress Notes (Signed)
 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   All questions were answered. The patient knows to call the clinic with any problems, questions or concerns. The total time spent in the appointment was 20 minutes encounter with patients including review of chart and various tests results, discussions about plan of care and coordination of care plan   Hicks Lonn, MD 04/27/2023 1:16 PM  INTERVAL HISTORY: Please see below for problem oriented charting. he returns for surveillance follow-up for history of head and neck cancer He is doing well He had recent knee surgery with improvement of his functional status Denies changes to his neck or new oral lesions  REVIEW OF SYSTEMS:   Constitutional: Denies fevers, chills or abnormal weight loss Eyes: Denies blurriness of vision Ears, nose, mouth, throat, and face: Denies mucositis or sore throat Respiratory: Denies cough, dyspnea or wheezes Cardiovascular: Denies palpitation, chest discomfort or lower extremity swelling Gastrointestinal:  Denies nausea, heartburn or change in bowel  habits Skin: Denies abnormal skin rashes Lymphatics: Denies new lymphadenopathy or easy bruising Neurological:Denies numbness, tingling or new weaknesses Behavioral/Psych: Mood is stable, no new changes  All other systems were reviewed with the patient and are negative.  I have reviewed the past medical history, past surgical history, social history and family history with the patient and they are unchanged from previous note.  ALLERGIES:  is allergic to bee pollen and pollen extract.  MEDICATIONS:  Current Outpatient Medications  Medication Sig Dispense Refill   Apremilast (OTEZLA) 30 MG TABS Take 30 mg by mouth in the morning and at bedtime.     aspirin  EC 81 MG tablet Take 1 tablet (81 mg total) by mouth 2 (two) times daily. Swallow whole. 60 tablet 0   cholecalciferol (VITAMIN D3) 25 MCG (1000 UNIT) tablet Take 1,000 Units by mouth daily.     cyanocobalamin  (VITAMIN B12) 1000 MCG/ML injection Inject 1 mL (1,000 mcg total) into the muscle every 30 (thirty) days. 10 mL 0   dapagliflozin  propanediol (FARXIGA ) 10 MG TABS tablet Take 1 tablet (10 mg total) by mouth daily before breakfast. 90 tablet 2   isosorbide  mononitrate (IMDUR ) 30 MG 24 hr tablet Take 1 tablet (30 mg total) by mouth daily. 90 tablet 1   levothyroxine  (SYNTHROID ) 100 MCG tablet Take 1 tablet (100 mcg total) by mouth daily before breakfast. 30 tablet 5   methocarbamol  (ROBAXIN ) 500 MG tablet Take 1-2 tablets (500-1,000 mg total) by mouth 4 (four) times daily. 60 tablet 0   metoprolol  (TOPROL  XL) 200 MG 24 hr tablet Take 1 tablet (200 mg total) by mouth daily. 90 tablet 3   nitroGLYCERIN  (NITROSTAT ) 0.4 MG SL tablet Place 1 tablet (  0.4 mg total) under the tongue every 5 (five) minutes as needed. 25 tablet 3   omeprazole  (PRILOSEC) 20 MG capsule Take 1 capsule (20 mg total) by mouth daily. 30 capsule 2   ondansetron  (ZOFRAN -ODT) 4 MG disintegrating tablet Take 1 tablet (4 mg total) by mouth every 8 (eight) hours as needed  for nausea or vomiting. 20 tablet 0   oxyCODONE  (ROXICODONE ) 15 MG immediate release tablet Take 1 tablet by mouth every 6 hours as needed. 90 tablet 0   sacubitril -valsartan  (ENTRESTO ) 49-51 MG Take 1 tablet by mouth 2 (two) times daily. 60 tablet 5   simvastatin  (ZOCOR ) 20 MG tablet Take 1 tablet (20 mg total) by mouth at bedtime. 90 tablet 1   Skin Protectants, Misc. (MINERIN CREME) CREA Apply 1 Application topically daily as needed (rash).     spironolactone  (ALDACTONE ) 25 MG tablet Take 0.5 tablets (12.5 mg total) by mouth daily. 45 tablet 3   triamcinolone cream (KENALOG) 0.1 % Apply 1 Application topically daily as needed (rash).     No current facility-administered medications for this visit.    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 FNA of right level III lymph node was inconclusive for cancer   07/25/2013 Imaging   PET scan showed locally advanced disease with abnormal lymphadenopathy in the right axilla   08/06/2013 Procedure   CT-guided biopsy of the lymphadenopathy was negative for malignancy   08/15/2013 Surgery   Patient has placement of port and feeding tube   08/19/2013 - 09/10/2013 Chemotherapy   Patient received chemotherapy with cisplatin . The patient only received 2 doses due to uncontrolled nausea and acute renal failure.   08/19/2013 - 10/15/2013 Radiation Therapy   Received Helical IMRT Tomotherapy:  Right Tonstil and bilateral neck / 70 Gy in 35 fractions to gross disease, 63 Gy in 35 fractions to high risk nodal echelons, and 56 Gy in 35 fractions to intermediate risk nodal  echelons.   08/27/2013 - 08/30/2013 Hospital Admission   The patient was admitted to the hospital for uncontrolled nausea vomiting and dehydration.   02/14/2014 Imaging   PET/CT scan showed complete response to treatment   03/19/2014 Surgery   He had excisional lymph node biopsy from the right neck. Pathology was benign   05/13/2014 Surgery   He had removal of Port-A-Cath.   05/22/2014 Imaging   Repeat CT scan of the neck show no evidence of disease recurrence.   12/09/2014 Imaging   Ct neck without contrast showed persistent abnormalities on the right side of neck, indeterminate   12/25/2014 Imaging   PET CT scan showed disease recurrence.   02/10/2015 Procedure   He has placement of port   02/13/2015 - 07/13/2015 Chemotherapy   He received chemotherapy with carbo/Taxol    04/21/2015 Imaging   PET CT scan showed improved disease control   08/04/2015 Imaging   PET scan showed persistent disease   08/17/2015 -  Chemotherapy   He was started with Keytruda    10/19/2015 Imaging   Ct neck showed mass-like intermediate density soft tissue at the right lateral neck recurrence site  stable.    02/26/2016 Imaging   CT neck showed unchanged appearance of right neck recurrence compared to 10/19/2015 CT. No noncontrast evidence of new metastatic disease in the neck. Chronic sinusitis, progressed.   02/26/2016 Imaging   Diffuse but patchy and asymmetric partial airspace filling process (ground-glass opacity) in the lungs. This could be due to respiratory bronchiolitis, hypersensitivity pneumonitis or possible drug reaction. Atypical/viral pneumonia is also possible. Pulmonary consultation may be a helpful. A three-month follow-up noncontrast chest CT is suggested. Slight interval enlargement of mediastinal lymph nodes and a small lymph node along the left major fissure. This is most likely due to the inflammatory process in the lungs. No findings for metastatic disease involving the chest. No findings  for upper abdominal metastatic disease.   02/29/2016 Adverse Reaction   His treatment is placed on hold due to possible hypersensitivity pneumonitis/drug reaction   04/14/2016 Imaging   Ct chest showed no evidence for metastatic disease within the chest. Significant interval improvement and near complete resolution of previously described diffuse bilateral predominately ground-glass pulmonary opacities, most compatible with resolving infectious/inflammatory process.   09/05/2016 Imaging   CT scan of neck and chest  1. Unchanged appearance of right neck recurrence. 2. No evidence of new metastatic disease in the neck. 3. Unremarkable and stable CT appearance of the chest. No findings suspicious for metastatic disease   07/24/2017 Imaging   1. No findings suspicious for metastatic disease in the chest. 2. No acute consolidative airspace disease to suggest a pneumonia. 3. No appreciable change in chronic mild patchy upper lung predominant centrilobular micronodularity. If the patient is a current smoker, these findings are most compatible with smoking related interstitial lung disease. If the patient is not a current smoker, these findings suggest subacute hypersensitivity pneumonitis  or postinflammatory change. 4. Stable mild biapical radiation fibrosis. 5. Mild to moderate centrilobular emphysema with diffuse bronchial wall thickening, suggesting COPD. 6. One vessel coronary atherosclerosis.  Aortic Atherosclerosis (ICD10-I70.0) and Emphysema (ICD10-J43.9).   09/15/2017 Imaging   1. No evidence of interstitial lung disease. 2.  Emphysema (ICD10-J43.9).   10/29/2017 Imaging   1. Moderate quality exam for pulmonary embolism with primary limitation of motion. No embolism identified. 2. Findings of congestive heart failure, including bilateral pleural effusions and septal thickening. 3. New thoracic adenopathy since approximately 6 weeks ago. Favor secondary to fluid overload/congestive heart  failure. 4. New left apical 4 mm pulmonary nodule. Favored to represent a subpleural lymph node. Non-contrast chest CT can be considered in 12 months, given risk factors for primary bronchogenic carcinoma. This recommendation follows the consensus statement: Guidelines for Management of Incidental Pulmonary Nodules Detected on CT Images: From the Fleischner Society 2017; Radiology 2017; [id number].     10/29/2017 - 11/02/2017 Hospital Admission   He was admitted to the hospital due to shortness of breath and was found to have congestive heart failure.   10/30/2017 Imaging   Definity  used; severe global reduction in LV systolic function; severe LVE; restrictive filling; mild MR; mild LAE; mild RVE with moderate RV dysfunction; mild TR with moderate pulmonary hypertension.   01/26/2018 Imaging   1. Regression of soft tissue in the postoperative right neck favoring scarring. No new or progressive finding to suggest recurrent disease. 2. Chest CT reported separately   01/26/2018 Imaging   1. No evidence of thoracic metastasis. 2.  Port in the anterior chest wall with tip in distal    03/12/2018 Imaging   1. Trace, silent aspiration noted after a swallow  consistent with aspiration of residual barium in the hypopharynx. 2. Retrograde reflux of contrast into the posterior nasopharynx with swallowing. 3. No gross esophageal abnormality 4. Initial difficulty swallowing a 13 mm barium tablet that than passes readily into the stomach once it is swallowed.   05/31/2018 Imaging   Resolution of previously seen mucosal hyperenhancement in the pharynx and larynx. Otherwise unchanged examination of the neck without evidence of recurrent disease or cervical nodal metastases.     06/07/2018 Echocardiogram   1. The left ventricle has moderately reduced systolic function, with an ejection fraction of 35-40%. The cavity size was severely dilated. Left ventricular diastolic Doppler parameters are consistent with  impaired relaxation Left ventricular diffuse hypokinesis.  2. The right ventricle has normal systolic function. The cavity was moderately enlarged. There is no increase in right ventricular wall thickness.  3. The mitral valve is normal in structure.  4. The tricuspid valve is normal in structure.  5. The aortic valve is normal in structure.  6. The pulmonic valve was normal in structure.  7. The inferior vena cava was dilated in size with >50% respiratory variability.   02/24/2020 Imaging   Ct neck Postsurgical changes of the right side of the neck. No evidence of recurrent disease. No lymphadenopathy   02/24/2020 Imaging   1. No evidence of metastatic disease in the chest, abdomen, or pelvis. 2. Innumerable tiny centrilobular pulmonary nodules, most concentrated in the upper lobes, likely reflecting smoking-related respiratory bronchiolitis. 3. Hepatic steatosis. 4. Aortic Atherosclerosis (ICD10-I70.0).     10/01/2021 Imaging   CT neck  The visualized portions of the brain and the posterior fossa are normal.   Mucosal thickening of the right maxillary sinus. The orbits are normal. Trace secretions in the nasopharynx. The nasal cavity and nasopharynx are otherwise unremarkable.   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 Postsurgical soft tissue thickening on the right side of the neck along the posterior triangle, carotid sheath, and parapharyngeal/mucosal pharyngeal spaces. No new or enlarging enhancing mass seen. Mild asymmetric atrophy along the right tonsillar fossa, likely from treatment of the prior mass. No discrete lesion in the oropharynx. The oropharynx and oral cavity are otherwise unremarkable. The remaining parapharyngeal spaces are clear.   Mild atrophy of the submandibular glands. The parotid glands appear normal.   The larynx and hypopharynx are normal.   There is no lymphadenopathy.   The thyroid  gland is normal.   No acute osseous  abnormality. No lytic or blastic osseous lesions. Degenerative changes of the spine.   CT chest  No evidence for intrathoracic metastatic disease.   Mild diffuse bronchial wall thickening with scattered centrilobular groundglass nodules in bilateral upper lobes, may represent underlying respiratory bronchiolitis.    11/23/2021 Echocardiogram   Summary   1. Technically difficult study.    2. The left ventricle is upper normal in size with normal wall thickness.    3. The left ventricular systolic function is moderately to severely  decreased, LVEF is visually estimated at 35-40%.    4. There is grade I diastolic dysfunction (impaired relaxation).    5. The right ventricle is normal in size, with normal systolic function.    01/20/2022 Imaging   1. Stable emphysematous changes but no acute overlying pulmonary process or worrisome pulmonary lesions to suggest metastatic disease. 2. No mediastinal or hilar mass or adenopathy. 3. Diffuse fatty infiltration of the liver.   Emphysema (ICD10-J43.9).   01/20/2022 Imaging   Within the limitations of  a non-contrast enhanced exam,   1. Postsurgical changes in the right neck without evidence of recurrent disease. There is persistent soft tissue thickening in the right level 2A lymph node station, which grossly appears unchanged compared to 02/24/2020 CT of the neck. 2. No new lymphadenopathy.     02/11/2022 Procedure   Successful removal of the right chest Port-A-Cath.       PHYSICAL EXAMINATION: ECOG PERFORMANCE STATUS: 1 - Symptomatic but completely ambulatory  Vitals:   04/27/23 1229  BP: 115/63  Pulse: 83  Resp: 16  Temp: 97.8 F (36.6 C)  SpO2: 97%   Filed Weights   04/27/23 1229  Weight: 253 lb (114.8 kg)    GENERAL:alert, no distress and comfortable SKIN: skin color, texture, turgor are normal, no rashes or significant lesions EYES: normal, Conjunctiva are pink and non-injected, sclera clear OROPHARYNX:no exudate, no  erythema and lips, buccal mucosa, and tongue normal  NECK: Noted well-healed surgical scar.  No other abnormalities LYMPH:  no palpable lymphadenopathy in the cervical, axillary or inguinal LUNGS: clear to auscultation and percussion with normal breathing effort HEART: regular rate & rhythm and no murmurs and no lower extremity edema ABDOMEN:abdomen soft, non-tender and normal bowel sounds Musculoskeletal:no cyanosis of digits and no clubbing  NEURO: alert & oriented x 3 with fluent speech, no focal motor/sensory deficits  LABORATORY DATA:  I have reviewed the data as listed    Component Value Date/Time   NA 138 04/27/2023 1212   NA 137 06/28/2022 1258   NA 141 04/20/2017 1028   K 4.5 04/27/2023 1212   K 4.3 04/20/2017 1028   CL 103 04/27/2023 1212   CO2 32 04/27/2023 1212   CO2 26 04/20/2017 1028   GLUCOSE 59 (L) 04/27/2023 1212   GLUCOSE 96 04/20/2017 1028   BUN 12 04/27/2023 1212   BUN 10 06/28/2022 1258   BUN 16.7 04/20/2017 1028   CREATININE 1.71 (H) 04/27/2023 1212   CREATININE 1.65 (H) 01/06/2022 1408   CREATININE 2.0 (H) 04/20/2017 1028   CALCIUM 10.0 04/27/2023 1212   CALCIUM 9.0 04/20/2017 1028   PROT 7.3 04/27/2023 1212   PROT 6.4 01/09/2023 1341   PROT 7.0 04/20/2017 1028   ALBUMIN  4.2 04/27/2023 1212   ALBUMIN  4.0 01/09/2023 1341   ALBUMIN  4.0 04/20/2017 1028   AST 20 04/27/2023 1212   AST 35 01/06/2022 1408   AST 20 04/20/2017 1028   ALT 13 04/27/2023 1212   ALT 32 01/06/2022 1408   ALT 17 04/20/2017 1028   ALKPHOS 63 04/27/2023 1212   ALKPHOS 61 04/20/2017 1028   BILITOT 0.5 04/27/2023 1212   BILITOT 0.5 01/09/2023 1341   BILITOT 0.6 01/06/2022 1408   BILITOT 0.49 04/20/2017 1028   GFRNONAA 47 (L) 04/27/2023 1212   GFRNONAA 49 (L) 01/06/2022 1408   GFRAA 58 (L) 01/01/2020 1127    No results found for: SPEP, UPEP  Lab Results  Component Value Date   WBC 7.7 04/27/2023   NEUTROABS 5.7 04/27/2023   HGB 14.7 04/27/2023   HCT 45.0 04/27/2023    MCV 95.7 04/27/2023   PLT 268 04/27/2023      Chemistry      Component Value Date/Time   NA 138 04/27/2023 1212   NA 137 06/28/2022 1258   NA 141 04/20/2017 1028   K 4.5 04/27/2023 1212   K 4.3 04/20/2017 1028   CL 103 04/27/2023 1212   CO2 32 04/27/2023 1212   CO2 26 04/20/2017 1028   BUN  12 04/27/2023 1212   BUN 10 06/28/2022 1258   BUN 16.7 04/20/2017 1028   CREATININE 1.71 (H) 04/27/2023 1212   CREATININE 1.65 (H) 01/06/2022 1408   CREATININE 2.0 (H) 04/20/2017 1028      Component Value Date/Time   CALCIUM 10.0 04/27/2023 1212   CALCIUM 9.0 04/20/2017 1028   ALKPHOS 63 04/27/2023 1212   ALKPHOS 61 04/20/2017 1028   AST 20 04/27/2023 1212   AST 35 01/06/2022 1408   AST 20 04/20/2017 1028   ALT 13 04/27/2023 1212   ALT 32 01/06/2022 1408   ALT 17 04/20/2017 1028   BILITOT 0.5 04/27/2023 1212   BILITOT 0.5 01/09/2023 1341   BILITOT 0.6 01/06/2022 1408   BILITOT 0.49 04/20/2017 1028

## 2023-04-27 NOTE — Assessment & Plan Note (Signed)
His recent imaging studies in 2023 showed no signs of cancer recurrence I plan to see him again in 6 months for further follow-up He is a long-term cancer survivor and does not need surveillance imaging

## 2023-04-27 NOTE — Assessment & Plan Note (Signed)
He has intermittent elevated serum creatinine Observe closely

## 2023-04-27 NOTE — Assessment & Plan Note (Signed)
He has chronic throat pain related to prior treatment I refilled his prescription pain medicine today 

## 2023-05-01 ENCOUNTER — Other Ambulatory Visit: Payer: Self-pay | Admitting: Hematology and Oncology

## 2023-05-01 ENCOUNTER — Other Ambulatory Visit (HOSPITAL_COMMUNITY): Payer: Self-pay

## 2023-05-01 MED ORDER — OXYCODONE HCL 15 MG PO TABS
15.0000 mg | ORAL_TABLET | Freq: Four times a day (QID) | ORAL | 0 refills | Status: DC | PRN
  Filled 2023-05-01: qty 90, 23d supply, fill #0

## 2023-05-05 ENCOUNTER — Ambulatory Visit: Payer: Medicaid Other | Admitting: Nurse Practitioner

## 2023-05-07 ENCOUNTER — Other Ambulatory Visit (HOSPITAL_COMMUNITY): Payer: Self-pay

## 2023-05-08 NOTE — Progress Notes (Signed)
Surgery orders requested via Epic inbox with Laurier Nancy, PA-C.

## 2023-05-10 ENCOUNTER — Encounter (HOSPITAL_COMMUNITY): Payer: Medicaid Other

## 2023-05-13 NOTE — Progress Notes (Addendum)
COVID Vaccine received:  []  No [x]  Yes Date of any COVID positive Test in last 90 days:  PCP - French Ana, FNP Cardiologist -  Dietrich Pates, MD   Oncology- Artis Delay, MD   Chest x-ray - 01-16-2018  1v  Epic EKG -  10-03-2022  Epic Stress Test - lexiscan 11-08-2022  Epic ECHO - 11-03-2022  Epic Cardiac Cath -  CTA calcium score- 145 on 11-17-2022  Epic  PCR screen: [x]  Ordered & Completed []   No Order but Needs PROFEND     []   N/A for this surgery  Surgery Plan:  [x]  Ambulatory   []  Outpatient in bed  []  Admit Anesthesia:    []  General  []  Spinal  []   Choice []   MAC  Pacemaker / ICD device [x]  No []  Yes   Spinal Cord Stimulator:[x]  No []  Yes       History of Sleep Apnea? []  No []  Yes   CPAP used?- [x]  No []  Yes    Does the patient monitor blood sugar?   [x]  N/A   []  No []  Yes  Patient has: [x]  NO Hx DM   []  Pre-DM   []  DM1  []   DM2  Patient is on FARXIGA for his CHF and Kidney function. He will hold x 72 hours.  Blood Thinner / Instructions:  none Aspirin Instructions: ASA  - Hold x 5-7 days  ERAS Protocol Ordered: []  No  []  Yes PRE-SURGERY []  ENSURE  []  G2   []  No Drink Ordered  Patient is to be NPO after:    NO ORDERS, sent 2 IB Msg.   Dental hx: []  Dentures:  []  N/A      []  Bridge or Partial:                   []  Loose or Damaged teeth:   Comments: Patient was given the 5 CHG shower / bath instructions for  TKA  surgery along with 2 bottles of the CHG soap. Patient will start this on: 05-18-2023 All questions were asked and answered, Patient voiced understanding of this process.   Activity level: Patient is able / unable to climb a flight of stairs without difficulty; []  No CP  []  No SOB, but would have ___   Patient can / can not perform ADLs without assistance.   Anesthesia review: CHF, CAD, CKD3, HTN, GERD, psoriasis, s/p tonsillar cancer surgery/ radiation 2016,  anemia,  Hx of "O2 slow in rising"  Patient denies shortness of breath, fever, cough and chest pain  at PAT appointment.  Patient verbalized understanding and agreement to the Pre-Surgical Instructions that were given to them at this PAT appointment. Patient was also educated of the need to review these PAT instructions again prior to his surgery.I reviewed the appropriate phone numbers to call if they have any and questions or concerns.

## 2023-05-13 NOTE — Patient Instructions (Addendum)
SURGICAL WAITING ROOM VISITATION Patients having surgery or a procedure may have no more than 2 support people in the waiting area - these visitors may rotate in the visitor waiting room.   Due to an increase in RSV and influenza rates and associated hospitalizations, children ages 68 and under may not visit patients in [facility] hospitals. If the patient needs to stay at the hospital during part of their recovery, the visitor guidelines for inpatient rooms apply.  PRE-OP VISITATION  Pre-op nurse will coordinate an appropriate time for 1 support person to accompany the patient in pre-op.  This support person may not rotate.  This visitor will be contacted when the time is appropriate for the visitor to come back in the pre-op area.  Please refer to the [facility] website for the visitor guidelines for Inpatients (after your surgery is over and you are in a regular room).  You are not required to quarantine at this time prior to your surgery. However, you must do this: Hand Hygiene often Do NOT share personal items Notify your provider if you are in close contact with someone who has COVID or you develop fever 100.4 or greater, new onset of sneezing, cough, sore throat, shortness of breath or body aches.  If you test positive for Covid or have been in contact with anyone that has tested positive in the last 10 days please notify you surgeon.    Your procedure is scheduled on:  Monday  May 22, 2023  Report to [facility] Main Entrance: Leota Jacobsen entrance where the [employer] is available.   Report to admitting at: 05:15    AM  Call this number if you have any questions or problems the morning of surgery [phone]  Do not eat or drink anything after Midnight the night prior to your surgery/procedure.    FOLLOW ANY ADDITIONAL PRE OP INSTRUCTIONS YOU RECEIVED FROM YOUR SURGEON'S OFFICE!!!   Oral Hygiene is also important to reduce your risk of infection.        Remember  - BRUSH YOUR TEETH THE MORNING OF SURGERY WITH YOUR REGULAR TOOTHPASTE  Do NOT smoke after Midnight the night before surgery.   FARXIGA- Dapagliflozin   -  stop taking this medication 72 hours before surgery. Your last dose will be taken on Thursday  05-18-2023  STOP TAKING all Vitamins, Herbs and supplements 1 week before your surgery.   Take ONLY these medicines the morning of surgery with A SIP OF WATER: omeprazole, levothyroxine, metoprolol, isosorbide (Imdur), and you may take Oxycodone if needed for pain.                     You may not have any metal on your body including  jewelry, and body piercing  Do not wear  lotions, powders,  cologne, or deodorant  Men may shave face and neck.  Contacts, Hearing Aids, dentures or bridgework may not be worn into surgery. DENTURES WILL BE REMOVED PRIOR TO SURGERY PLEASE DO NOT APPLY "Poly grip" OR ADHESIVES!!!  Patients discharged on the day of surgery will not be allowed to drive home.  Someone NEEDS to stay with you for the first 24 hours after anesthesia.  Do not bring your home medications to the hospital. The Pharmacy will dispense medications listed on your medication list to you during your admission in the Hospital.  Please read over the following fact sheets you were given: IF YOU HAVE QUESTIONS ABOUT YOUR PRE-OP INSTRUCTIONS, PLEASE  CALL [phone].     Pre-operative 5 CHG Bath Instructions   You can play a key role in reducing the risk of infection after surgery. Your skin needs to be as free of germs as possible. You can reduce the number of germs on your skin by washing with CHG (chlorhexidine gluconate) soap before surgery. CHG is an antiseptic soap that kills germs and continues to kill germs even after washing.   DO NOT use if you have an allergy to chlorhexidine/CHG or antibacterial soaps. If your skin becomes reddened or irritated, stop using the CHG and notify one of our RNs at [phone]  Please shower with the  CHG soap starting 4 days before surgery using the following schedule: START SHOWERS ON   THURSDAY  May 18, 2023                                                                                                                                                                              Please keep in mind the following:  DO NOT shave, including legs and underarms, starting the day of your first shower.   You may shave your face at any point before/day of surgery.   Place clean sheets on your bed the day you start using CHG soap. Use a clean washcloth (not used since being washed) for each shower. DO NOT sleep with pets once you start using the CHG.   CHG Shower Instructions:  If you choose to wash your hair and private area, wash first with your normal shampoo/soap.  After you use shampoo/soap, rinse your hair and body thoroughly to remove shampoo/soap residue.  Turn the water OFF and apply about 3 tablespoons (45 ml) of CHG soap to a CLEAN washcloth.  Apply CHG soap ONLY FROM YOUR NECK DOWN TO YOUR TOES (washing for 3-5 minutes)  DO NOT use CHG soap on face, private areas, open wounds, or sores.  Pay special attention to the area where your surgery is being performed.  If you are having back surgery, having someone wash your back for you may be helpful.  Wait 2 minutes after CHG soap is applied, then you may rinse off the CHG soap.  Pat dry with a clean towel  Put on clean clothes/pajamas   If you choose to wear lotion, please use ONLY the CHG-compatible lotions on the back of this paper.     Additional instructions for the day of surgery: DO NOT APPLY any lotions, deodorants, cologne, or perfumes.   Put on clean/comfortable clothes.  Brush your teeth.  Ask your nurse before applying any prescription medications to the skin.      CHG Compatible Lotions   Aveeno Moisturizing lotion  Cetaphil Moisturizing Cream  Cetaphil Moisturizing Lotion  Clairol Herbal Essence  Moisturizing Lotion, Dry Skin  Clairol Herbal Essence Moisturizing Lotion, Extra Dry Skin  Clairol Herbal Essence Moisturizing Lotion, Normal Skin  Curel Age Defying Therapeutic Moisturizing Lotion with Alpha Hydroxy  Curel Extreme Care Body Lotion  Curel Soothing Hands Moisturizing Hand Lotion  Curel Therapeutic Moisturizing Cream, Fragrance-Free  Curel Therapeutic Moisturizing Lotion, Fragrance-Free  Curel Therapeutic Moisturizing Lotion, Original Formula  Eucerin Daily Replenishing Lotion  Eucerin Dry Skin Therapy Plus Alpha Hydroxy Crme  Eucerin Dry Skin Therapy Plus Alpha Hydroxy Lotion  Eucerin Original Crme  Eucerin Original Lotion  Eucerin Plus Crme Eucerin Plus Lotion  Eucerin TriLipid Replenishing Lotion  Keri Anti-Bacterial Hand Lotion  Keri Deep Conditioning Original Lotion Dry Skin Formula Softly Scented  Keri Deep Conditioning Original Lotion, Fragrance Free Sensitive Skin Formula  Keri Lotion Fast Absorbing Fragrance Free Sensitive Skin Formula  Keri Lotion Fast Absorbing Softly Scented Dry Skin Formula  Keri Original Lotion  Keri Skin Renewal Lotion Keri Silky Smooth Lotion  Keri Silky Smooth Sensitive Skin Lotion  Nivea Body Creamy Conditioning Oil  Nivea Body Extra Enriched Lotion  Nivea Body Original Lotion  Nivea Body Sheer Moisturizing Lotion Nivea Crme  Nivea Skin Firming Lotion  NutraDerm 30 Skin Lotion  NutraDerm Skin Lotion  NutraDerm Therapeutic Skin Cream  NutraDerm Therapeutic Skin Lotion  ProShield Protective Hand Cream  Provon moisturizing lotion   FAILURE TO FOLLOW THESE INSTRUCTIONS MAY RESULT IN THE CANCELLATION OF YOUR SURGERY  PATIENT SIGNATURE_________________________________  NURSE SIGNATURE__________________________________  ________________________________________________________________________     Mason [name]    An incentive spirometer is a tool that can help keep your lungs clear and active. This tool measures  how well you are filling your lungs with each breath. Taking long deep breaths may help reverse or decrease the chance of developing breathing (pulmonary) problems (especially infection) following: A long period of time when you are unable to move or be active. BEFORE THE PROCEDURE  If the spirometer includes an indicator to show your best effort, your nurse or respiratory therapist will set it to a desired goal. If possible, sit up straight or lean slightly forward. Try not to slouch. Hold the incentive spirometer in an upright position. INSTRUCTIONS FOR USE  Sit on the edge of your bed if possible, or sit up as far as you can in bed or on a chair. Hold the incentive spirometer in an upright position. Breathe out normally. Place the mouthpiece in your mouth and seal your lips tightly around it. Breathe in slowly and as deeply as possible, raising the piston or the ball toward the top of the column. Hold your breath for 3-5 seconds or for as long as possible. Allow the piston or ball to fall to the bottom of the column. Remove the mouthpiece from your mouth and breathe out normally. Rest for a few seconds and repeat Steps 1 through 7 at least 10 times every 1-2 hours when you are awake. Take your time and take a few normal breaths between deep breaths. The spirometer may include an indicator to show your best effort. Use the indicator as a goal to work toward during each repetition. After each set of 10 deep breaths, practice coughing to be sure your lungs are clear. If you have an incision (the cut made at the time of surgery), support your incision when coughing by placing a pillow or rolled up towels firmly against it. Once you are able to get out  of bed, walk around indoors and cough well. You may stop using the incentive spirometer when instructed by your caregiver.  RISKS AND COMPLICATIONS Take your time so you do not get dizzy or light-headed. If you are in pain, you may need to take or  ask for pain medication before doing incentive spirometry. It is harder to take a deep breath if you are having pain. AFTER USE Rest and breathe slowly and easily. It can be helpful to keep track of a log of your progress. Your caregiver can provide you with a simple table to help with this. If you are using the spirometer at home, follow these instructions: SEEK MEDICAL CARE IF:  You are having difficultly using the spirometer. You have trouble using the spirometer as often as instructed. Your pain medication is not giving enough relief while using the spirometer. You develop fever of 100.5 F (38.1 C) or higher.                                                                                                    SEEK IMMEDIATE MEDICAL CARE IF:  You cough up bloody sputum that had not been present before. You develop fever of 102 F (38.9 C) or greater. You develop worsening pain at or near the incision site. MAKE SURE YOU:  Understand these instructions. Will watch your condition. Will get help right away if you are not doing well or get worse. Document Released: 08/15/2006 Document Revised: 06/27/2011 Document Reviewed: 10/16/2006 [facility] Patient Information 2014 [place], [place].            If you would like to see a video about joint replacement:   [email or web address]

## 2023-05-15 ENCOUNTER — Encounter (HOSPITAL_COMMUNITY)
Admission: RE | Admit: 2023-05-15 | Discharge: 2023-05-15 | Disposition: A | Discharge status: Home / Self Care | Payer: Medicaid Other | Source: Ambulatory Visit | Attending: Orthopedic Surgery | Admitting: Orthopedic Surgery

## 2023-05-15 ENCOUNTER — Other Ambulatory Visit: Payer: Self-pay | Admitting: Internal Medicine

## 2023-05-15 ENCOUNTER — Other Ambulatory Visit: Payer: Self-pay | Admitting: Orthopedic Surgery

## 2023-05-15 ENCOUNTER — Encounter (HOSPITAL_COMMUNITY): Payer: Self-pay

## 2023-05-15 ENCOUNTER — Other Ambulatory Visit: Payer: Self-pay | Admitting: Hematology and Oncology

## 2023-05-15 ENCOUNTER — Other Ambulatory Visit: Payer: Self-pay

## 2023-05-15 VITALS — BP 108/69 | HR 72 | Temp 97.7°F | Resp 14 | Ht 77.0 in | Wt 248.0 lb

## 2023-05-15 DIAGNOSIS — Z87891 Personal history of nicotine dependence: Secondary | ICD-10-CM | POA: Diagnosis not present

## 2023-05-15 DIAGNOSIS — Z01812 Encounter for preprocedural laboratory examination: Secondary | ICD-10-CM | POA: Insufficient documentation

## 2023-05-15 DIAGNOSIS — Z96651 Presence of right artificial knee joint: Secondary | ICD-10-CM | POA: Diagnosis not present

## 2023-05-15 DIAGNOSIS — Z923 Personal history of irradiation: Secondary | ICD-10-CM | POA: Insufficient documentation

## 2023-05-15 DIAGNOSIS — Z85818 Personal history of malignant neoplasm of other sites of lip, oral cavity, and pharynx: Secondary | ICD-10-CM | POA: Diagnosis not present

## 2023-05-15 DIAGNOSIS — I130 Hypertensive heart and chronic kidney disease with heart failure and stage 1 through stage 4 chronic kidney disease, or unspecified chronic kidney disease: Secondary | ICD-10-CM | POA: Diagnosis not present

## 2023-05-15 DIAGNOSIS — Z01818 Encounter for other preprocedural examination: Secondary | ICD-10-CM

## 2023-05-15 DIAGNOSIS — K219 Gastro-esophageal reflux disease without esophagitis: Secondary | ICD-10-CM | POA: Insufficient documentation

## 2023-05-15 DIAGNOSIS — I2510 Atherosclerotic heart disease of native coronary artery without angina pectoris: Secondary | ICD-10-CM | POA: Insufficient documentation

## 2023-05-15 DIAGNOSIS — G8929 Other chronic pain: Secondary | ICD-10-CM

## 2023-05-15 DIAGNOSIS — E039 Hypothyroidism, unspecified: Secondary | ICD-10-CM | POA: Diagnosis not present

## 2023-05-15 DIAGNOSIS — I5022 Chronic systolic (congestive) heart failure: Secondary | ICD-10-CM | POA: Diagnosis not present

## 2023-05-15 DIAGNOSIS — M1712 Unilateral primary osteoarthritis, left knee: Secondary | ICD-10-CM | POA: Diagnosis not present

## 2023-05-15 DIAGNOSIS — Z9221 Personal history of antineoplastic chemotherapy: Secondary | ICD-10-CM | POA: Diagnosis not present

## 2023-05-15 DIAGNOSIS — I428 Other cardiomyopathies: Secondary | ICD-10-CM | POA: Insufficient documentation

## 2023-05-15 DIAGNOSIS — N183 Chronic kidney disease, stage 3 unspecified: Secondary | ICD-10-CM | POA: Diagnosis not present

## 2023-05-15 HISTORY — DX: Gastro-esophageal reflux disease without esophagitis: K21.9

## 2023-05-15 HISTORY — DX: Atherosclerotic heart disease of native coronary artery without angina pectoris: I25.10

## 2023-05-15 LAB — CBC
HCT: 45.8 % (ref 39.0–52.0)
Hemoglobin: 14.6 g/dL (ref 13.0–17.0)
MCH: 30.7 pg (ref 26.0–34.0)
MCHC: 31.9 g/dL (ref 30.0–36.0)
MCV: 96.4 fL (ref 80.0–100.0)
Platelets: 265 10*3/uL (ref 150–400)
RBC: 4.75 MIL/uL (ref 4.22–5.81)
RDW: 12.5 % (ref 11.5–15.5)
WBC: 9.2 10*3/uL (ref 4.0–10.5)
nRBC: 0 % (ref 0.0–0.2)

## 2023-05-15 LAB — BASIC METABOLIC PANEL
Anion gap: 10 (ref 5–15)
BUN: 15 mg/dL (ref 6–20)
CO2: 27 mmol/L (ref 22–32)
Calcium: 9.6 mg/dL (ref 8.9–10.3)
Chloride: 101 mmol/L (ref 98–111)
Creatinine, Ser: 1.45 mg/dL — ABNORMAL HIGH (ref 0.61–1.24)
GFR, Estimated: 57 mL/min — ABNORMAL LOW (ref >60)
Glucose, Bld: 88 mg/dL (ref 70–99)
Potassium: 4.4 mmol/L (ref 3.5–5.1)
Sodium: 138 mmol/L (ref 135–145)

## 2023-05-15 LAB — SURGICAL PCR SCREEN
MRSA, PCR: NEGATIVE
Staphylococcus aureus: NEGATIVE

## 2023-05-15 MED ORDER — OMEPRAZOLE 20 MG PO CPDR
20.0000 mg | DELAYED_RELEASE_CAPSULE | Freq: Every day | ORAL | 2 refills | Status: DC
  Filled 2023-05-15: qty 30, 30d supply, fill #0
  Filled 2023-06-12: qty 30, 30d supply, fill #1
  Filled 2023-07-05: qty 30, 30d supply, fill #2

## 2023-05-16 ENCOUNTER — Encounter (HOSPITAL_COMMUNITY): Payer: Self-pay

## 2023-05-16 ENCOUNTER — Other Ambulatory Visit: Payer: Self-pay

## 2023-05-16 ENCOUNTER — Other Ambulatory Visit (HOSPITAL_COMMUNITY): Payer: Self-pay

## 2023-05-16 NOTE — Progress Notes (Signed)
Case: [id number] Date/Time: 05/22/23 0700   Procedure: TOTAL KNEE ARTHROPLASTY (Left: Knee)   Anesthesia type: Spinal   Pre-op diagnosis: Osteoarthritis left knee  M17.12   [facility]: [facility] ROOM 07 / [facility]   Surgeons: Dannielle Huh, MD       DISCUSSION: Mason [name] is a 58 year old male who presents to PAT prior to surgery above.  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 Sherlean Foot.  Had successful spinal anesthesia.  No complications noted.  Patient follows with cardiology for HFrEF and nonischemic cardiomyopathy.  Last seen on 02/01/2023.  Noted to be doing well at that visit and euvolemic on exam.  Prior stress testing in June 2024 was intermediate risk and CT of the coronaries showed minimal nonobstructive CAD.  He was cleared for prior surgery and has no new symptoms.  Patient with history of tonsillar cancer and is followed by oncology.  Last seen on 04/27/2023.  He has chronic throat pain.  He is status post chemo and radiation. Per Dr. Bertis Ruddy:  "He returns for surveillance follow-up for history of head and neck cancer He is doing well He had recent knee surgery with improvement of his functional status Denies changes to his neck or new oral lesions Advised to follow-up in 6 months."  VS: BP 108/69 Comment: left arm sitting  Pulse 72   Temp 36.5 C (Oral)   Resp 14   Ht 6\' 5"  (1.956 m)   Wt 112.5 kg   SpO2 98%   BMI 29.41 kg/m   PROVIDERS: French Ana, FNP Cardiology: Dietrich Pates, MD   Oncology: Artis Delay, MD   LABS: Labs reviewed: Acceptable for surgery. (all labs ordered are listed, but only abnormal results are displayed)  Labs Reviewed  BASIC METABOLIC PANEL - Abnormal; Notable for the following components:      Result Value    Creatinine, Ser 1.45 (*)    GFR, Estimated 57 (*)    All other components within normal limits  SURGICAL PCR SCREEN  CBC     IMAGES: CT Coronary 11/17/2022 IMPRESSION: 1. Minimal mixed non-obstructive CAD, CADRADS = 1.   2. Coronary artery calcium score is 145, which places the patient in the 93rd percentile for age/race and sex-matched control.   3. Normal coronary origin with right dominance.   4. Given age-advanced CAD, would recommend aggressive cardiovascular risk factor modification.    EKG:   CV: Myocardial Perfusion 11/08/2022   Findings are consistent with infarction. The study is intermediate risk.   No ST deviation was noted.   LV perfusion is abnormal. There is no evidence of ischemia. There is evidence of infarction. Defect 1: There is a medium defect with mild reduction in uptake present in the apical to mid inferior location(s) that is fixed. There is abnormal wall motion in the defect area. Consistent with infarction.   Left ventricular function is abnormal. Global function is mildly reduced. Nuclear stress EF: 41%. End diastolic cavity size is normal. End systolic cavity size is normal. No evidence of transient ischemic dilation (TID) noted.   Prior study not available for comparison.   Echo 11/03/2022  1. Left ventricular ejection fraction, by estimation, is 55 to 60%. The  left ventricle has normal function. The left ventricle has no regional  wall motion abnormalities. There is mild concentric  left ventricular  hypertrophy. Left ventricular diastolic  parameters are consistent with Grade I diastolic dysfunction (impaired  relaxation).   2. Right ventricular systolic function is normal. The right ventricular  size is normal. Tricuspid regurgitation signal is inadequate for assessing  PA pressure.   3. The mitral valve is normal in structure. No evidence of mitral valve  regurgitation. No evidence of mitral stenosis.   4. The aortic valve is tricuspid.  Aortic valve regurgitation is not  visualized. No aortic stenosis is present.   5. Technically difficult study with poor acoustic windows.  Past Medical History:  Diagnosis Date   Abnormal liver function test    Acute sinusitis, unspecified 05/18/2015   Anemia    Anxiety    mild new dx   Arthritis    knees,hips   Bilateral edema of lower extremity    Complication of anesthesia    Pt stated " my oxygen level was slow in rising."   Concussion    Hx: in high school x 2   Constipation    Coronary artery disease    Depression    Dysuria 09/04/2015   Fever    GERD (gastroesophageal reflux disease)    Hyperactive gag reflex    Hypertension    Hypoglycemia    Hypokalemia    Hypothyroidism    Insomnia 06/15/2015   Knee pain, chronic    Malnutrition (HCC)    Non-healing surgical wound 05/23/2014   PEG (percutaneous endoscopic gastrostomy) status (HCC)    Renal failure, acute (HCC)    CKD3   S/P radiation therapy 08/19/2013-10/15/2013   Right Tonstil and bilateral neck / 70 Gy in 35 fractions to gross disease, 63 Gy in 35 fractions to high risk nodal echelons, and 56 Gy in 35 fractions to intermediate risk nodal echelons   Severe nausea and vomiting    Status post chemotherapy    Only received 2 doses due to uncontrolled nausea and acute renal failure   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, recurrent 2016   Vitamin B12 deficiency 01/07/2022    Past Surgical History:  Procedure Laterality Date   IR REMOVAL TUN ACCESS W/ PORT W/O FL MOD SED  02/11/2022   LAPAROSCOPIC GASTROSTOMY N/A 08/15/2013   Procedure: LAPAROSCOPIC GASTROSTOMY TUBE PLACEMENT;  Surgeon: Axel Filler, MD;  Location: [facility];  Service: General;  Laterality: N/A;   LYMPH NODE BIOPSY  03/20/14   right neck   MULTIPLE EXTRACTIONS WITH ALVEOLOPLASTY N/A 08/01/2013   Procedure: Extraction of tooth #'s 1,15,17,31, 32 with alveoloplasty, mandibular left torus reduction, and gross debridement of  remaining teeth.;  Surgeon: Charlynne Pander, DDS;  Location: [facility] OR;  Service: Oral Surgery;  Laterality: N/A;   PORT-A-CATH REMOVAL N/A 05/13/2014   Procedure: REMOVAL of PORT-A-CATH;  Surgeon: Axel Filler, MD;  Location: [facility] OR;  Service: General;  Laterality: N/A;   PORTACATH PLACEMENT Left 08/15/2013   Procedure: INSERTION PORT-A-CATH;  Surgeon: Axel Filler, MD;  Location: [facility] OR;  Service: General;  Laterality: Left;   TOTAL KNEE ARTHROPLASTY Right 02/20/2023   Procedure: TOTAL KNEE ARTHROPLASTY;  Surgeon: Dannielle Huh, MD;  [facility];  Service: Orthopedics;  Laterality: Right;    MEDICATIONS:  Apremilast (OTEZLA) 30 MG TABS   aspirin EC 81 MG tablet   cholecalciferol (VITAMIN D3) 25 MCG (1000 UNIT) tablet   cyanocobalamin (VITAMIN B12) 1000 MCG/ML injection   dapagliflozin propanediol (FARXIGA) 10 MG TABS tablet   isosorbide mononitrate (IMDUR) 30 MG 24  hr tablet   levothyroxine (SYNTHROID) 100 MCG tablet   metoprolol (TOPROL XL) 200 MG 24 hr tablet   nitroGLYCERIN (NITROSTAT) 0.4 MG SL tablet   omeprazole (PRILOSEC) 20 MG capsule   ondansetron (ZOFRAN-ODT) 4 MG disintegrating tablet   oxyCODONE (ROXICODONE) 15 MG immediate release tablet   sacubitril-valsartan (ENTRESTO) 49-51 MG   simvastatin (ZOCOR) 20 MG tablet   Skin Protectants, Misc. (MINERIN CREME) CREA   spironolactone (ALDACTONE) 25 MG tablet   triamcinolone cream (KENALOG) 0.1 %   No current facility-administered medications for this encounter.   Marcille Blanco [facility] Short Stay/Anesthesiology [facility] Phone [phone] 05/16/2023 1:34 PM

## 2023-05-16 NOTE — Anesthesia Preprocedure Evaluation (Addendum)
Anesthesia Evaluation  Patient identified by MRN, date of birth, ID band Patient awake    Reviewed: Allergy & Precautions, NPO status , Patient's Chart, lab work & pertinent test results, reviewed documented beta blocker date and time   History of Anesthesia Complications Negative for: history of anesthetic complications  Airway Mallampati: I  TM Distance: >3 FB Neck ROM: Full    Dental  (+) Edentulous Upper, Edentulous Lower   Pulmonary COPD, former smoker Tonsillar cancer: XRT Lung nodules   breath sounds clear to auscultation       Cardiovascular hypertension, Pt. on medications and Pt. on home beta blockers (-) angina + CAD (CT of the coronaries showed minimal nonobstructive CAD) and +CHF Sherryll Burger)   Rhythm:Regular Rate:Normal  10/2022 Stress:  * Findings are consistent with infarction. The study is intermediate risk.   No ST deviation was noted.   LV perfusion is abnormal. There is no evidence of ischemia. There is evidence of infarction. Defect 1: There is a medium defect with mild reduction in uptake present in the apical to mid inferior location(s) that is fixed. There is abnormal wall motion in the defect area. Consistent with infarction.   Left ventricular function is abnormal. Global function is mildly reduced. Nuclear stress EF: 41%.  10/2022 ECHO: EF 55-60%, normal LVF, mild LVH, Grade 1 DD, no significant valvular abnormalities   Neuro/Psych   Anxiety Depression    negative neurological ROS     GI/Hepatic Neg liver ROS,GERD  Medicated and Controlled,,H/o PEG   Endo/Other  Hypothyroidism    Renal/GU Renal InsufficiencyRenal disease     Musculoskeletal   Abdominal   Peds  Hematology Hb 13.4, plt 242k   Anesthesia Other Findings   Reproductive/Obstetrics                              Anesthesia Physical Anesthesia Plan  ASA: 3  Anesthesia Plan: Spinal   Post-op Pain  Management: Regional block* and Tylenol PO (pre-op)*   Induction:   PONV Risk Score and Plan: 1 and Treatment may vary due to age or medical condition and Ondansetron  Airway Management Planned: Natural Airway and Simple Face Mask  Additional Equipment: None  Intra-op Plan:   Post-operative Plan:   Informed Consent: I have reviewed the patients History and Physical, chart, labs and discussed the procedure including the risks, benefits and alternatives for the proposed anesthesia with the patient or authorized representative who has indicated his/her understanding and acceptance.       Plan Discussed with: CRNA and Surgeon  Anesthesia Plan Comments: (See PAT note from 1/27 by Sherlie Ban PA-C Plan routine monitors, SAB with adductor canal block for post op analgesia )         Anesthesia Quick Evaluation

## 2023-05-17 ENCOUNTER — Other Ambulatory Visit (HOSPITAL_COMMUNITY): Payer: Self-pay

## 2023-05-17 MED ORDER — ENTRESTO 49-51 MG PO TABS
1.0000 | ORAL_TABLET | Freq: Two times a day (BID) | ORAL | 8 refills | Status: DC
  Filled 2023-05-17: qty 60, 30d supply, fill #0
  Filled 2023-07-05: qty 60, 30d supply, fill #1
  Filled 2023-07-26 – 2023-08-13 (×2): qty 60, 30d supply, fill #2
  Filled 2023-09-07: qty 60, 30d supply, fill #3
  Filled 2023-10-11: qty 60, 30d supply, fill #4
  Filled 2023-11-10: qty 60, 30d supply, fill #5
  Filled 2023-12-25: qty 60, 30d supply, fill #6
  Filled 2024-01-22: qty 60, 30d supply, fill #7
  Filled 2024-02-27: qty 60, 30d supply, fill #8

## 2023-05-20 ENCOUNTER — Other Ambulatory Visit: Payer: Self-pay | Admitting: Hematology and Oncology

## 2023-05-22 ENCOUNTER — Other Ambulatory Visit (HOSPITAL_COMMUNITY): Payer: Self-pay

## 2023-05-22 ENCOUNTER — Other Ambulatory Visit: Payer: Self-pay

## 2023-05-22 ENCOUNTER — Ambulatory Visit (HOSPITAL_BASED_OUTPATIENT_CLINIC_OR_DEPARTMENT_OTHER): Payer: Medicaid Other | Admitting: Anesthesiology

## 2023-05-22 ENCOUNTER — Ambulatory Visit (HOSPITAL_COMMUNITY)
Admission: RE | Admit: 2023-05-22 | Discharge: 2023-05-22 | Disposition: A | Discharge status: Home / Self Care | Payer: Medicaid Other | Attending: Orthopedic Surgery | Admitting: Orthopedic Surgery

## 2023-05-22 ENCOUNTER — Encounter (HOSPITAL_COMMUNITY)
Admission: RE | Disposition: A | Discharge status: Home / Self Care | Payer: Self-pay | Source: Home / Self Care | Attending: Orthopedic Surgery

## 2023-05-22 ENCOUNTER — Telehealth: Payer: Self-pay

## 2023-05-22 ENCOUNTER — Encounter (HOSPITAL_COMMUNITY): Payer: Self-pay

## 2023-05-22 ENCOUNTER — Ambulatory Visit (HOSPITAL_COMMUNITY): Payer: Medicaid Other | Admitting: Medical

## 2023-05-22 ENCOUNTER — Encounter (HOSPITAL_COMMUNITY): Payer: Self-pay | Admitting: Orthopedic Surgery

## 2023-05-22 ENCOUNTER — Encounter: Payer: Self-pay | Admitting: Hematology and Oncology

## 2023-05-22 DIAGNOSIS — G8929 Other chronic pain: Secondary | ICD-10-CM

## 2023-05-22 DIAGNOSIS — I130 Hypertensive heart and chronic kidney disease with heart failure and stage 1 through stage 4 chronic kidney disease, or unspecified chronic kidney disease: Secondary | ICD-10-CM | POA: Insufficient documentation

## 2023-05-22 DIAGNOSIS — Z931 Gastrostomy status: Secondary | ICD-10-CM | POA: Diagnosis not present

## 2023-05-22 DIAGNOSIS — Z79890 Hormone replacement therapy (postmenopausal): Secondary | ICD-10-CM | POA: Diagnosis not present

## 2023-05-22 DIAGNOSIS — M1712 Unilateral primary osteoarthritis, left knee: Secondary | ICD-10-CM

## 2023-05-22 DIAGNOSIS — J449 Chronic obstructive pulmonary disease, unspecified: Secondary | ICD-10-CM | POA: Diagnosis not present

## 2023-05-22 DIAGNOSIS — Z7982 Long term (current) use of aspirin: Secondary | ICD-10-CM | POA: Insufficient documentation

## 2023-05-22 DIAGNOSIS — I110 Hypertensive heart disease with heart failure: Secondary | ICD-10-CM | POA: Diagnosis not present

## 2023-05-22 DIAGNOSIS — I509 Heart failure, unspecified: Secondary | ICD-10-CM | POA: Diagnosis not present

## 2023-05-22 DIAGNOSIS — I2510 Atherosclerotic heart disease of native coronary artery without angina pectoris: Secondary | ICD-10-CM | POA: Insufficient documentation

## 2023-05-22 DIAGNOSIS — Z87891 Personal history of nicotine dependence: Secondary | ICD-10-CM | POA: Insufficient documentation

## 2023-05-22 DIAGNOSIS — N183 Chronic kidney disease, stage 3 unspecified: Secondary | ICD-10-CM | POA: Diagnosis not present

## 2023-05-22 DIAGNOSIS — Z7984 Long term (current) use of oral hypoglycemic drugs: Secondary | ICD-10-CM | POA: Diagnosis not present

## 2023-05-22 DIAGNOSIS — K219 Gastro-esophageal reflux disease without esophagitis: Secondary | ICD-10-CM | POA: Insufficient documentation

## 2023-05-22 DIAGNOSIS — Z79899 Other long term (current) drug therapy: Secondary | ICD-10-CM | POA: Diagnosis not present

## 2023-05-22 DIAGNOSIS — J439 Emphysema, unspecified: Secondary | ICD-10-CM | POA: Insufficient documentation

## 2023-05-22 DIAGNOSIS — Z833 Family history of diabetes mellitus: Secondary | ICD-10-CM | POA: Insufficient documentation

## 2023-05-22 DIAGNOSIS — Z9221 Personal history of antineoplastic chemotherapy: Secondary | ICD-10-CM | POA: Diagnosis not present

## 2023-05-22 DIAGNOSIS — F32A Depression, unspecified: Secondary | ICD-10-CM | POA: Diagnosis not present

## 2023-05-22 DIAGNOSIS — E1140 Type 2 diabetes mellitus with diabetic neuropathy, unspecified: Secondary | ICD-10-CM | POA: Insufficient documentation

## 2023-05-22 DIAGNOSIS — E039 Hypothyroidism, unspecified: Secondary | ICD-10-CM | POA: Diagnosis not present

## 2023-05-22 DIAGNOSIS — F419 Anxiety disorder, unspecified: Secondary | ICD-10-CM | POA: Insufficient documentation

## 2023-05-22 HISTORY — PX: TOTAL KNEE ARTHROPLASTY: SHX125

## 2023-05-22 LAB — COMPREHENSIVE METABOLIC PANEL
ALT: 13 U/L (ref 0–44)
AST: 18 U/L (ref 15–41)
Albumin: 3.3 g/dL — ABNORMAL LOW (ref 3.5–5.0)
Alkaline Phosphatase: 51 U/L (ref 38–126)
Anion gap: 7 (ref 5–15)
BUN: 13 mg/dL (ref 6–20)
CO2: 26 mmol/L (ref 22–32)
Calcium: 9 mg/dL (ref 8.9–10.3)
Chloride: 104 mmol/L (ref 98–111)
Creatinine, Ser: 1.38 mg/dL — ABNORMAL HIGH (ref 0.61–1.24)
GFR, Estimated: >60 mL/min (ref >60)
Glucose, Bld: 97 mg/dL (ref 70–99)
Potassium: 4.1 mmol/L (ref 3.5–5.1)
Sodium: 137 mmol/L (ref 135–145)
Total Bilirubin: 0.7 mg/dL (ref 0.0–1.2)
Total Protein: 6.5 g/dL (ref 6.5–8.1)

## 2023-05-22 LAB — CBC WITH DIFFERENTIAL/PLATELET
Abs Immature Granulocytes: 0.03 10*3/uL (ref 0.00–0.07)
Basophils Absolute: 0 10*3/uL (ref 0.0–0.1)
Basophils Relative: 0 %
Eosinophils Absolute: 0.2 10*3/uL (ref 0.0–0.5)
Eosinophils Relative: 2 %
HCT: 42 % (ref 39.0–52.0)
Hemoglobin: 13.4 g/dL (ref 13.0–17.0)
Immature Granulocytes: 0 %
Lymphocytes Relative: 12 %
Lymphs Abs: 0.9 10*3/uL (ref 0.7–4.0)
MCH: 30.4 pg (ref 26.0–34.0)
MCHC: 31.9 g/dL (ref 30.0–36.0)
MCV: 95.2 fL (ref 80.0–100.0)
Monocytes Absolute: 0.8 10*3/uL (ref 0.1–1.0)
Monocytes Relative: 10 %
Neutro Abs: 5.6 10*3/uL (ref 1.7–7.7)
Neutrophils Relative %: 76 %
Platelets: 242 10*3/uL (ref 150–400)
RBC: 4.41 MIL/uL (ref 4.22–5.81)
RDW: 12.5 % (ref 11.5–15.5)
WBC: 7.4 10*3/uL (ref 4.0–10.5)
nRBC: 0 % (ref 0.0–0.2)

## 2023-05-22 SURGERY — ARTHROPLASTY, KNEE, TOTAL
Anesthesia: Spinal | Site: Knee | Laterality: Left

## 2023-05-22 MED ORDER — BUPIVACAINE-EPINEPHRINE 0.25% -1:200000 IJ SOLN
INTRAMUSCULAR | Status: AC
Start: 2023-05-22 — End: ?
  Filled 2023-05-22: qty 1

## 2023-05-22 MED ORDER — PROPOFOL 500 MG/50ML IV EMUL
INTRAVENOUS | Status: DC | PRN
  Administered 2023-05-22: 100 ug/kg/min via INTRAVENOUS

## 2023-05-22 MED ORDER — ONDANSETRON HCL 4 MG/2ML IJ SOLN
INTRAMUSCULAR | Status: DC | PRN
  Administered 2023-05-22: 4 mg via INTRAVENOUS

## 2023-05-22 MED ORDER — ROPIVACAINE HCL 7.5 MG/ML IJ SOLN
INTRAMUSCULAR | Status: DC | PRN
  Administered 2023-05-22: 20 mL via PERINEURAL

## 2023-05-22 MED ORDER — ONDANSETRON HCL 4 MG/2ML IJ SOLN
INTRAMUSCULAR | Status: AC
  Filled 2023-05-22: qty 2

## 2023-05-22 MED ORDER — ACETAMINOPHEN 500 MG PO TABS: 1000.0000 mg | ORAL_TABLET | Freq: Once | ORAL | Status: DC

## 2023-05-22 MED ORDER — FENTANYL CITRATE (PF) 100 MCG/2ML IJ SOLN
INTRAMUSCULAR | Status: AC
  Filled 2023-05-22: qty 2

## 2023-05-22 MED ORDER — LACTATED RINGERS IV SOLN: INTRAVENOUS | Status: DC

## 2023-05-22 MED ORDER — SODIUM CHLORIDE (PF) 0.9 % IJ SOLN
INTRAMUSCULAR | Status: DC | PRN
  Administered 2023-05-22: 70 mL

## 2023-05-22 MED ORDER — SODIUM CHLORIDE 0.9 % IR SOLN
Status: DC | PRN
  Administered 2023-05-22: 1000 mL

## 2023-05-22 MED ORDER — MIDAZOLAM HCL 2 MG/2ML IJ SOLN
INTRAMUSCULAR | Status: AC
  Filled 2023-05-22: qty 2

## 2023-05-22 MED ORDER — GABAPENTIN 300 MG PO CAPS
300.0000 mg | ORAL_CAPSULE | Freq: Once | ORAL | Status: AC
  Administered 2023-05-22: 300 mg via ORAL
  Filled 2023-05-22: qty 1

## 2023-05-22 MED ORDER — OXYCODONE HCL 5 MG PO TABS
ORAL_TABLET | ORAL | Status: AC
  Filled 2023-05-22: qty 1

## 2023-05-22 MED ORDER — ORAL CARE MOUTH RINSE: 15.0000 mL | Freq: Once | OROMUCOSAL | Status: AC

## 2023-05-22 MED ORDER — PHENYLEPHRINE HCL-NACL 20-0.9 MG/250ML-% IV SOLN
INTRAVENOUS | Status: AC
  Filled 2023-05-22: qty 250

## 2023-05-22 MED ORDER — LACTATED RINGERS IV BOLUS
250.0000 mL | Freq: Once | INTRAVENOUS | Status: DC
Start: 2023-05-22 — End: 2023-05-22

## 2023-05-22 MED ORDER — HYDROMORPHONE HCL 1 MG/ML IJ SOLN: 0.2500 mg | INTRAMUSCULAR | Status: DC | PRN

## 2023-05-22 MED ORDER — PROPOFOL 10 MG/ML IV BOLUS
INTRAVENOUS | Status: DC | PRN
  Administered 2023-05-22 (×2): 20 mg via INTRAVENOUS
  Administered 2023-05-22: 10 mg via INTRAVENOUS
  Administered 2023-05-22: 20 mg via INTRAVENOUS

## 2023-05-22 MED ORDER — HYDROMORPHONE HCL 2 MG PO TABS
2.0000 mg | ORAL_TABLET | Freq: Four times a day (QID) | ORAL | 0 refills | Status: AC | PRN
  Filled 2023-05-22: qty 40, 5d supply, fill #0

## 2023-05-22 MED ORDER — LACTATED RINGERS IV BOLUS: 500.0000 mL | Freq: Once | INTRAVENOUS | Status: DC

## 2023-05-22 MED ORDER — DEXAMETHASONE SODIUM PHOSPHATE 10 MG/ML IJ SOLN
8.0000 mg | Freq: Once | INTRAMUSCULAR | Status: AC
  Administered 2023-05-22: 8 mg via INTRAVENOUS

## 2023-05-22 MED ORDER — METHOCARBAMOL 500 MG PO TABS
500.0000 mg | ORAL_TABLET | Freq: Four times a day (QID) | ORAL | 0 refills | Status: DC
  Filled 2023-05-22: qty 60, 8d supply, fill #0

## 2023-05-22 MED ORDER — CEFAZOLIN SODIUM-DEXTROSE 2-4 GM/100ML-% IV SOLN
2.0000 g | INTRAVENOUS | Status: AC
  Administered 2023-05-22: 2 g via INTRAVENOUS
  Filled 2023-05-22: qty 100

## 2023-05-22 MED ORDER — BUPIVACAINE LIPOSOME 1.3 % IJ SUSP: 20.0000 mL | Freq: Once | INTRAMUSCULAR | Status: DC

## 2023-05-22 MED ORDER — ACETAMINOPHEN 500 MG PO TABS
1000.0000 mg | ORAL_TABLET | Freq: Once | ORAL | Status: AC
  Administered 2023-05-22: 1000 mg via ORAL
  Filled 2023-05-22: qty 2

## 2023-05-22 MED ORDER — CHLORHEXIDINE GLUCONATE 0.12 % MT SOLN
15.0000 mL | Freq: Once | OROMUCOSAL | Status: AC
  Administered 2023-05-22: 15 mL via OROMUCOSAL

## 2023-05-22 MED ORDER — ASPIRIN 81 MG PO TBEC
81.0000 mg | DELAYED_RELEASE_TABLET | Freq: Two times a day (BID) | ORAL | 0 refills | Status: AC
  Filled 2023-05-22: qty 60, 30d supply, fill #0

## 2023-05-22 MED ORDER — FENTANYL CITRATE (PF) 100 MCG/2ML IJ SOLN
INTRAMUSCULAR | Status: DC | PRN
  Administered 2023-05-22: 100 ug via INTRAVENOUS

## 2023-05-22 MED ORDER — DEXAMETHASONE SODIUM PHOSPHATE 10 MG/ML IJ SOLN
INTRAMUSCULAR | Status: AC
  Filled 2023-05-22: qty 1

## 2023-05-22 MED ORDER — BUPIVACAINE IN DEXTROSE 0.75-8.25 % IT SOLN
INTRATHECAL | Status: DC | PRN
  Administered 2023-05-22: 1.8 mL via INTRATHECAL

## 2023-05-22 MED ORDER — 0.9 % SODIUM CHLORIDE (POUR BTL) OPTIME
TOPICAL | Status: DC | PRN
  Administered 2023-05-22: 1000 mL

## 2023-05-22 MED ORDER — PHENYLEPHRINE HCL-NACL 20-0.9 MG/250ML-% IV SOLN
INTRAVENOUS | Status: DC | PRN
  Administered 2023-05-22: 50 ug/min via INTRAVENOUS

## 2023-05-22 MED ORDER — TRANEXAMIC ACID-NACL 1000-0.7 MG/100ML-% IV SOLN
1000.0000 mg | INTRAVENOUS | Status: AC
  Administered 2023-05-22: 1000 mg via INTRAVENOUS
  Filled 2023-05-22: qty 100

## 2023-05-22 MED ORDER — OXYCODONE HCL 5 MG PO TABS
5.0000 mg | ORAL_TABLET | Freq: Once | ORAL | Status: AC | PRN
  Administered 2023-05-22: 5 mg via ORAL

## 2023-05-22 MED ORDER — OXYCODONE HCL 5 MG/5ML PO SOLN: 5.0000 mg | Freq: Once | ORAL | Status: AC | PRN

## 2023-05-22 MED ORDER — MIDAZOLAM HCL 5 MG/5ML IJ SOLN
INTRAMUSCULAR | Status: DC | PRN
  Administered 2023-05-22: 2 mg via INTRAVENOUS

## 2023-05-22 MED ORDER — PROPOFOL 1000 MG/100ML IV EMUL
INTRAVENOUS | Status: AC
Start: 2023-05-22 — End: ?
  Filled 2023-05-22: qty 100

## 2023-05-22 MED ORDER — BUPIVACAINE LIPOSOME 1.3 % IJ SUSP
INTRAMUSCULAR | Status: AC
  Filled 2023-05-22: qty 20

## 2023-05-22 MED ORDER — POVIDONE-IODINE 10 % EX SWAB
2.0000 | Freq: Once | CUTANEOUS | Status: AC
  Administered 2023-05-22: 2 via TOPICAL

## 2023-05-22 MED ORDER — MIDAZOLAM HCL 2 MG/2ML IJ SOLN: 0.5000 mg | Freq: Once | INTRAMUSCULAR | Status: DC | PRN

## 2023-05-22 SURGICAL SUPPLY — 46 items
BAG COUNTER SPONGE SURGICOUNT (BAG) IMPLANT
BAG ZIPLOCK 12X15 (MISCELLANEOUS) ×1 IMPLANT
BLADE SAGITTAL 13X1.27X60 (BLADE) ×1 IMPLANT
BLADE SAW SGTL 18X1.27X75 (BLADE) ×1 IMPLANT
BLADE SURG 15 STRL LF DISP TIS (BLADE) ×1 IMPLANT
BNDG ELASTIC 6INX 5YD STR LF (GAUZE/BANDAGES/DRESSINGS) ×1 IMPLANT
BOWL SMART MIX CTS (DISPOSABLE) ×1 IMPLANT
CEMENT BONE R 1X40 (Cement) ×2 IMPLANT
COVER SURGICAL LIGHT HANDLE (MISCELLANEOUS) ×1 IMPLANT
CUFF TRNQT CYL 34X4.125X (TOURNIQUET CUFF) ×1 IMPLANT
DRAPE INCISE IOBAN 66X45 STRL (DRAPES) ×2 IMPLANT
DRAPE U-SHAPE 47X51 STRL (DRAPES) ×1 IMPLANT
DRSG AQUACEL AG ADV 3.5X10 (GAUZE/BANDAGES/DRESSINGS) ×1 IMPLANT
DURAPREP 26ML APPLICATOR (WOUND CARE) ×2 IMPLANT
ELECT REM PT RETURN 15FT ADLT (MISCELLANEOUS) ×1 IMPLANT
FEMUR CMT CCR STD SZ11 L KNEE (Knees) ×1 IMPLANT
FEMUR CMTD CCR STD SZ11 L KNEE (Knees) IMPLANT
GLOVE BIOGEL M 7.0 STRL (GLOVE) IMPLANT
GLOVE BIOGEL PI IND STRL 7.5 (GLOVE) IMPLANT
GLOVE BIOGEL PI IND STRL 8.5 (GLOVE) ×1 IMPLANT
GLOVE SURG ORTHO 8.0 STRL STRW (GLOVE) ×2 IMPLANT
GOWN STRL REUS W/ TWL XL LVL3 (GOWN DISPOSABLE) ×2 IMPLANT
HOLDER FOLEY CATH W/STRAP (MISCELLANEOUS) ×1 IMPLANT
HOOD PEEL AWAY T7 (MISCELLANEOUS) ×3 IMPLANT
KIT TURNOVER KIT A (KITS) IMPLANT
LINER TIB ASF PS GH/10-12 LT (Liner) IMPLANT
MANIFOLD NEPTUNE II (INSTRUMENTS) ×1 IMPLANT
NS IRRIG 1000ML POUR BTL (IV SOLUTION) ×1 IMPLANT
PACK TOTAL KNEE CUSTOM (KITS) ×1 IMPLANT
PROTECTOR NERVE ULNAR (MISCELLANEOUS) ×1 IMPLANT
SET HNDPC FAN SPRY TIP SCT (DISPOSABLE) ×1 IMPLANT
SPIKE FLUID TRANSFER (MISCELLANEOUS) ×2 IMPLANT
STEM POLY PAT PLY 38M KNEE (Knees) IMPLANT
STEM TIBIA 5 DEG SZ G L KNEE (Knees) IMPLANT
STRIP CLOSURE SKIN 1/2X4 (GAUZE/BANDAGES/DRESSINGS) ×1 IMPLANT
SUT BONE WAX W31G (SUTURE) ×1 IMPLANT
SUT MNCRL AB 3-0 PS2 18 (SUTURE) ×1 IMPLANT
SUT STRATAFIX 0 PDS 27 VIOLET (SUTURE) ×1
SUT STRATAFIX 1PDS 45CM VIOLET (SUTURE) ×1 IMPLANT
SUT VIC AB 1 CT1 36 (SUTURE) ×1 IMPLANT
SUTURE STRATFX 0 PDS 27 VIOLET (SUTURE) ×1 IMPLANT
TIBIA STEM 5 DEG SZ G L KNEE (Knees) ×1 IMPLANT
TRAY FOLEY MTR SLVR 16FR STAT (SET/KITS/TRAYS/PACK) ×1 IMPLANT
TUBE SUCTION HIGH CAP CLEAR NV (SUCTIONS) ×1 IMPLANT
WATER STERILE IRR 1000ML POUR (IV SOLUTION) ×2 IMPLANT
WRAP KNEE MAXI GEL POST OP (GAUZE/BANDAGES/DRESSINGS) ×1 IMPLANT

## 2023-05-22 NOTE — Anesthesia Procedure Notes (Signed)
Anesthesia Regional Block: Adductor canal block   Pre-Anesthetic Checklist: , timeout performed,  Correct Patient, Correct Site, Correct Laterality,  Correct Procedure, Correct Position, site marked,  Risks and benefits discussed,  Surgical consent,  Pre-op evaluation,  At surgeon's request and post-op pain management  Laterality: Left and Lower  Prep: chloraprep       Needles:  Injection technique: Single-shot  Needle Type: Echogenic Needle     Needle Length: 9cm  Needle Gauge: 21     Additional Needles:   Procedures:,,,, ultrasound used (permanent image in chart),,    Narrative:  Start time: 05/22/2023 7:04 AM End time: 05/22/2023 7:11 AM Injection made incrementally with aspirations every 5 mL.  Performed by: Personally  Anesthesiologist: Jairo Ben, MD  Additional Notes: Pt identified in Holding room.  Monitors applied. Working IV access confirmed. Timeout, Sterile prep L thigh.  #21ga ECHOgenic Arrow block needle into adductor canal with US guidance.  20cc 0.75% Ropivacaine injected incrementally after negative test dose.  Patient asymptomatic, VSS, no heme aspirated, tolerated well.   Sandford Craze, MD

## 2023-05-22 NOTE — Transfer of Care (Signed)
Immediate Anesthesia Transfer of Care Note  Patient: Mason [name]  Procedure(s) Performed: TOTAL KNEE ARTHROPLASTY (Left: Knee)  Patient Location: PACU  Anesthesia Type:Spinal  Level of Consciousness: sedated  Airway & Oxygen Therapy: Patient Spontanous Breathing and Patient connected to face mask oxygen  Post-op Assessment: Report given to RN and Post -op Vital signs reviewed and stable  Post vital signs: Reviewed and stable  Last Vitals:  Vitals Value Taken Time  BP 103/76 05/22/23 0906  Temp 36.7 C 05/22/23 0900  Pulse 59 05/22/23 0908  Resp 20 05/22/23 0908  SpO2 99 % 05/22/23 0908  Vitals shown include unfiled device data.  Last Pain:  Vitals:   05/22/23 0646  TempSrc:   PainSc: 5          Complications: No notable events documented.

## 2023-05-22 NOTE — Op Note (Signed)
TOTAL KNEE REPLACEMENT OPERATIVE NOTE:  05/22/2023  9:22 AM  PATIENT:  Mason [name]  58 y.o. male  PRE-OPERATIVE DIAGNOSIS:  Osteoarthritis left knee  M17.12  POST-OPERATIVE DIAGNOSIS:  Osteoarthritis left knee M17.12  PROCEDURE:  Procedure(s): TOTAL KNEE ARTHROPLASTY  SURGEON:  Surgeon(s): Dannielle Huh, MD  PHYSICIAN ASSISTANT: Laurier Nancy, PA-C   ANESTHESIA:   spinal  SPECIMEN: None  COUNTS:  Correct  TOURNIQUET:   Total Tourniquet Time Documented: Thigh (Left) - 46 minutes Total: Thigh (Left) - 46 minutes   DICTATION:  Indication for procedure:    The patient is a 59 y.o. male who has failed conservative treatment for Osteoarthritis left knee  M17.12.  Informed consent was obtained prior to anesthesia. The risks versus benefits of the operation were explain and in a way the patient can, and did, understand.     Description of procedure:     The patient was taken to the operating room and placed under anesthesia.  The patient was positioned in the usual fashion taking care that all body parts were adequately padded and/or protected.  A tourniquet was applied and the leg prepped and draped in the usual sterile fashion.  The extremity was exsanguinated with the esmarch and tourniquet inflated to 250 mmHg.  Pre-operative range of motion was normal.    A midline incision approximately 6-7 inches long was made with a #10 blade.  A new blade was used to make a parapatellar arthrotomy going 2-3 cm into the quadriceps tendon, over the patella, and alongside the medial aspect of the patellar tendon.  A synovectomy was then performed with the #10 blade and forceps. I then elevated the deep MCL off the medial tibial metaphysis subperiosteally around to the semimembranosus attachment.    I everted the patella and used calipers to measure patellar thickness.  I used the reamer to ream down to appropriate thickness to recreate the native thickness.  I then removed excess bone  with the rongeur and sagittal saw.  I used the appropriately sized template and drilled the three lug holes.  I then put the trial in place and measured the thickness with the calipers to ensure recreation of the native thickness.  The trial was then removed and the patella subluxed and the knee brought into flexion.  A homan retractor was place to retract and protect the patella and lateral structures.  A Z-retractor was place medially to protect the medial structures.  The extra-medullary alignment system was used to make cut the tibial articular surface perpendicular to the anamotic axis of the tibia and in 3 degrees of posterior slope.  The cut surface and alignment jig was removed.  I then used the intramedullary alignment guide to make a 4 valgus cut on the distal femur.  I then marked out the epicondylar axis on the distal femur.   I then used the anterior referencing sizer and measured the femur to be a size  11 .  The 4-In-1 cutting block was screwed into place in external rotation matching the posterior condylar angle, making our cuts perpendicular to the epicondylar axis.  Anterior, posterior and chamfer cuts were made with the sagittal saw.  The cutting block and cut pieces were removed.  A lamina spreader was placed in 90 degrees of flexion.  The ACL, PCL, menisci, and posterior condylar osteophytes were removed.  A 11 mm spacer blocked was found to offer good flexion and extension gap balance after minimal in degree releasing.   The scoop  retractor was then placed and the femoral finishing block was pinned in place.  The small sagittal saw was used as well as the lug drill to finish the femur.  The block and cut surfaces were removed and the medullary canal hole filled with autograft bone from the cut pieces.  The tibia was delivered forward in deep flexion and external rotation.  A size G tray was selected and pinned into place centered on the medial 1/3 of the tibial tubercle.  The reamer and  keel was used to prepare the tibia through the tray.    I then trialed with the size  11  femur, size G tibia, a 11 mm insert and the 38 patella.  I had excellent flexion/extension gap balance, excellent patella tracking.  Flexion was full and beyond 120 degrees; extension was zero.  These components were chosen and the staff opened them to me on the back table while the knee was lavaged copiously and the cement mixed.  The soft tissue was infiltrated with 60cc of exparel 1.3% through a 21 gauge needle.  I cemented in the components and removed all excess cement.  The polyethylene tibial component was snapped into place and the knee placed in extension while cement was hardening.  The capsule was infilltrated with a 60cc exparel/marcaine/saline mixture.   Once the cement was hard, the tourniquet was let down.  Hemostasis was obtained.  The arthrotomy was closed using a #1 stratofix running suture.  The deep soft tissues were closed with #0 vicryls and the subcuticular layer closed with #2-0 vicryl.  The skin was reapproximated and closed with 3.0 Monocryl.  The wound was covered with steristrips, aquacel dressing, and a TED stocking.   The patient was then awakened, extubated, and taken to the recovery room in stable condition.  BLOOD LOSS:  300cc COMPLICATIONS:  None.  PLAN OF CARE: Discharge to home after PACU  PATIENT DISPOSITION:  PACU - hemodynamically stable.   Delay start of Pharmacological VTE agent (>24hrs) due to surgical blood loss or risk of bleeding:  yes  Please fax a copy of this op note to my office at [phone] (please only include page 1 and 2 of the Case Information op note)

## 2023-05-22 NOTE — Addendum Note (Signed)
Addendum  created 05/22/23 1214 by Doran Clay, CRNA   Child order released for a procedure order, Clinical Note Signed, Intraprocedure Blocks edited, SmartForm saved

## 2023-05-22 NOTE — Anesthesia Postprocedure Evaluation (Signed)
Anesthesia Post Note  Patient: Mason [name]  Procedure(s) Performed: TOTAL KNEE ARTHROPLASTY (Left: Knee)     Patient location during evaluation: PACU Anesthesia Type: Spinal Level of consciousness: awake and alert, patient cooperative and oriented Pain management: pain level controlled Vital Signs Assessment: post-procedure vital signs reviewed and stable Respiratory status: nonlabored ventilation, spontaneous breathing and respiratory function stable Cardiovascular status: stable Postop Assessment: spinal receding and no apparent nausea or vomiting Anesthetic complications: no   No notable events documented.  Last Vitals:  Vitals:   05/22/23 1143 05/22/23 1145  BP:    Pulse: 66 61  Resp: 16 16  Temp: (!) 36.3 C   SpO2: 91% 94%    Last Pain:  Vitals:   05/22/23 1143  TempSrc: Axillary  PainSc: 0-No pain    LLE Motor Response: Purposeful movement (05/22/23 1143) LLE Sensation: Full sensation (05/22/23 1143) RLE Motor Response: Purposeful movement (05/22/23 1143) RLE Sensation: Full sensation (05/22/23 1143) L Sensory Level: S1-Sole of foot, small toes (05/22/23 1143) R Sensory Level: S1-Sole of foot, small toes (05/22/23 1143)  Marri Mcneff,E. Mabelle Mungin

## 2023-05-22 NOTE — Anesthesia Procedure Notes (Signed)
Spinal  Patient location during procedure: OR Start time: 05/22/2023 7:25 AM End time: 05/22/2023 7:27 AM Reason for block: surgical anesthesia Staffing Performed: resident/CRNA  Anesthesiologist: Jairo Ben, MD Resident/CRNA: Doran Clay, CRNA Performed by: Doran Clay, CRNA Authorized by: Jairo Ben, MD   Preanesthetic Checklist Completed: patient identified, IV checked, site marked, risks and benefits discussed, surgical consent, monitors and equipment checked, pre-op evaluation and timeout performed Spinal Block Patient position: sitting Prep: DuraPrep Patient monitoring: heart rate, cardiac monitor, continuous pulse ox and blood pressure Approach: midline Location: L3-4 Injection technique: single-shot Needle Needle type: Pencan  Needle gauge: 24 G Needle length: 10 cm Needle insertion depth: 8 cm Assessment Events: CSF return Additional Notes Timeout performed. Patient in sitting position. L3-4 identified. Cleansed with Duraprep. SAB performed. To supine position.

## 2023-05-22 NOTE — H&P (Signed)
Mason [name] MRN:  [id number] DOB/SEX:  [birth year]/male  CHIEF COMPLAINT:  Painful left Knee  HISTORY: Patient is a 58 y.o. male presented with a history of pain in the left knee. Onset of symptoms was gradual starting a few years ago with gradually worsening course since that time. Patient has been treated conservatively with over-the-counter NSAIDs and activity modification. Patient currently rates pain in the knee at 10 out of 10 with activity. There is pain at night.  PAST MEDICAL HISTORY: Patient Active Problem List   Diagnosis Date Noted   Vitamin B12 deficiency 01/07/2022   Deficiency anemia 01/06/2022   Psoriasis 01/06/2022   Preventive measure 01/01/2020   COVID-19 virus infection 12/06/2019   Major depression, recurrent, chronic (HCC) 11/29/2019   Elevated liver enzymes 11/29/2019   Oral lesion 04/04/2019   Port-A-Cath in place 05/28/2018   Anxiety disorder due to medical condition 03/26/2018   Goals of care, counseling/discussion 01/29/2018   Insomnia disorder 01/29/2018   Drug-induced skin rash 01/17/2018   Yeast infection 01/16/2018   CHF (congestive heart failure) (HCC) 10/30/2017   Elevated LFTs    Restrictive lung disease 10/09/2017   Emphysema of lung (HCC) 09/18/2017   Dyspnea and respiratory abnormalities 07/13/2017   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   Acquired hypothyroidism 09/06/2016   Encounter for antineoplastic chemotherapy 08/09/2016   Debility 07/15/2016   Cough 06/17/2016   Nasal congestion 06/17/2016   Elevated BP without diagnosis of hypertension 06/17/2016   Poor dentition 05/20/2016   Drug-induced pneumonitis 02/29/2016   Sinus congestion 02/08/2016   Weight loss, non-intentional 01/18/2016   Dysuria 09/04/2015   Palliative care by specialist 08/05/2015   Insomnia 06/15/2015   Acute gout involving toe of right foot 05/25/2015   Acute sinusitis, unspecified 05/18/2015   Pancytopenia due to antineoplastic  chemotherapy (HCC) 04/27/2015   Cancer associated pain 02/12/2015   Lung nodule, multiple 12/30/2014   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   CKD (chronic kidney disease), stage III (HCC) 12/02/2014   Abdominal wall pain in left upper quadrant 09/30/2014   Acquired dysphasia 07/30/2014   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   Lymphedema of face 06/27/2014   Dysphagia, oropharyngeal 06/27/2014   Neck stiffness 06/27/2014   Throat pain in adult 02/19/2014   Dehydration 10/23/2013   Anemia in neoplastic disease 10/14/2013   Leukopenia due to antineoplastic chemotherapy (HCC) 10/14/2013   Renal insufficiency 10/14/2013   Nausea & vomiting 09/12/2013   Chronic periodontitis 08/01/2013   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   Past Medical History:  Diagnosis Date   Abnormal liver function test    Acute sinusitis, unspecified 05/18/2015   Anemia    Anxiety    mild new dx   Arthritis    knees,hips   Bilateral edema of lower extremity    Complication of anesthesia    Pt stated " my oxygen level was slow in rising."   Concussion    Hx: in high school x 2   Constipation    Coronary artery disease    Depression    Dysuria 09/04/2015   Fever    GERD (gastroesophageal reflux disease)    Hyperactive gag reflex    Hypertension    Hypoglycemia    Hypokalemia    Hypothyroidism    Insomnia 06/15/2015   Knee pain, chronic    Malnutrition (HCC)    Non-healing surgical wound 05/23/2014   PEG (percutaneous endoscopic gastrostomy) status (HCC)    Renal failure, acute (HCC)  CKD3   S/P radiation therapy 08/19/2013-10/15/2013   Right Tonstil and bilateral neck / 70 Gy in 35 fractions to gross disease, 63 Gy in 35 fractions to high risk nodal echelons, and 56 Gy in 35 fractions to intermediate risk nodal echelons   Severe nausea and vomiting    Status post chemotherapy    Only received 2 doses due to uncontrolled nausea and acute renal failure   Tonsillar cancer (HCC)  07/09/2013   SCCA of Right Tonsil, recurrent 2016   Vitamin B12 deficiency 01/07/2022   Past Surgical History:  Procedure Laterality Date   IR REMOVAL TUN ACCESS W/ PORT W/O FL MOD SED  02/11/2022   LAPAROSCOPIC GASTROSTOMY N/A 08/15/2013   Procedure: LAPAROSCOPIC GASTROSTOMY TUBE PLACEMENT;  Surgeon: Axel Filler, MD;  Location: [facility] OR;  Service: General;  Laterality: N/A;   LYMPH NODE BIOPSY  03/20/14   right neck   MULTIPLE EXTRACTIONS WITH ALVEOLOPLASTY N/A 08/01/2013   Procedure: Extraction of tooth #'s 1,15,17,31, 32 with alveoloplasty, mandibular left torus reduction, and gross debridement of remaining teeth.;  Surgeon: Charlynne Pander, DDS;  Location: [facility] OR;  Service: Oral Surgery;  Laterality: N/A;   PORT-A-CATH REMOVAL N/A 05/13/2014   Procedure: REMOVAL of PORT-A-CATH;  Surgeon: Axel Filler, MD;  Location: [facility] OR;  Service: General;  Laterality: N/A;   PORTACATH PLACEMENT Left 08/15/2013   Procedure: INSERTION PORT-A-CATH;  Surgeon: Axel Filler, MD;  Location: [facility] OR;  Service: General;  Laterality: Left;   TOTAL KNEE ARTHROPLASTY Right 02/20/2023   Procedure: TOTAL KNEE ARTHROPLASTY;  Surgeon: Dannielle Huh, MD;  Location: [facility] ORS;  Service: Orthopedics;  Laterality: Right;     MEDICATIONS:   Medications Prior to Admission  Medication Sig Dispense Refill Last Dose/Taking   Apremilast (OTEZLA) 30 MG TABS Take 30 mg by mouth in the morning and at bedtime.   Taking   aspirin EC 81 MG tablet Take 1 tablet (81 mg total) by mouth 2 (two) times daily. Swallow whole. 60 tablet 0 Taking   cholecalciferol (VITAMIN D3) 25 MCG (1000 UNIT) tablet Take 1,000 Units by mouth daily.   Taking   cyanocobalamin (VITAMIN B12) 1000 MCG/ML injection Inject 1 mL (1,000 mcg total) into the muscle every 30 (thirty) days. (Patient taking differently: Inject 1,000 mcg into the muscle every 3 (three) months.) 10 mL 0 Taking Differently   dapagliflozin propanediol (FARXIGA) 10 MG TABS tablet Take 1  tablet (10 mg total) by mouth daily before breakfast. 90 tablet 2 05/20/2023 at  9:00 AM   isosorbide mononitrate (IMDUR) 30 MG 24 hr tablet Take 1 tablet (30 mg total) by mouth daily. 90 tablet 1 05/22/2023 Morning   levothyroxine (SYNTHROID) 100 MCG tablet Take 1 tablet (100 mcg total) by mouth daily before breakfast. 30 tablet 5 05/22/2023 Morning   metoprolol (TOPROL XL) 200 MG 24 hr tablet Take 1 tablet (200 mg total) by mouth daily. 90 tablet 3 Taking   nitroGLYCERIN (NITROSTAT) 0.4 MG SL tablet Place 1 tablet (0.4 mg total) under the tongue every 5 (five) minutes as needed. 25 tablet 3 05/22/2023 at  4:30 AM   omeprazole (PRILOSEC) 20 MG capsule Take 1 capsule (20 mg total) by mouth daily. 30 capsule 2 05/21/2023   ondansetron (ZOFRAN-ODT) 4 MG disintegrating tablet Take 1 tablet (4 mg total) by mouth every 8 (eight) hours as needed for nausea or vomiting. 20 tablet 0 Past Month   oxyCODONE (ROXICODONE) 15 MG immediate release tablet Take 1 tablet by mouth every 6  hours as needed. 90 tablet 0 05/21/2023   sacubitril-valsartan (ENTRESTO) 49-51 MG Take 1 tablet by mouth 2 (two) times daily. 60 tablet 8 05/21/2023   simvastatin (ZOCOR) 20 MG tablet Take 1 tablet (20 mg total) by mouth at bedtime. 90 tablet 1 05/21/2023   Skin Protectants, Misc. (MINERIN CREME) CREA Apply 1 Application topically daily as needed (rash).   Taking As Needed   spironolactone (ALDACTONE) 25 MG tablet Take 0.5 tablets (12.5 mg total) by mouth daily. 45 tablet 3 05/21/2023   triamcinolone cream (KENALOG) 0.1 % Apply 1 Application topically daily as needed (eczema).   Taking As Needed    ALLERGIES:   Allergies  Allergen Reactions   Bee Pollen     Watery eyes, runny nose, sneezing   Pollen Extract Other (See Comments)    Watery eyes, runny nose, sneezing    REVIEW OF SYSTEMS:  A comprehensive review of systems was negative except for: Musculoskeletal: positive for arthralgias and bone pain   FAMILY HISTORY:   Family History   Problem Relation Age of Onset   Arthritis Mother    Heart disease Mother    Arthritis Father    CVA Other    Diabetes Other    Heart attack Other    Heart disease Maternal Grandmother    CVA Maternal Grandmother     SOCIAL HISTORY:   Social History   Tobacco Use   Smoking status: Former    Current packs/day: 0.00    Average packs/day: 1 pack/day for 20.0 years (20.0 ttl pk-yrs)    Types: Cigarettes    Start date: 07/16/1983    Quit date: 07/16/2003    Years since quitting: 19.8   Smokeless tobacco: Never  Substance Use Topics   Alcohol use: No    Comment: none years ago     EXAMINATION:  Vital signs in last 24 hours: Temp:  [98.2 F (36.8 C)] 98.2 F (36.8 C) (02/03 5427) Pulse Rate:  [68] 68 (02/03 0613) Resp:  [18] 18 (02/03 0613) BP: (102)/(68) 102/68 (02/03 0613) SpO2:  [96 %] 96 % (02/03 0613) Weight:  [112.5 kg] 112.5 kg (02/03 0646)  BP 102/68   Pulse 68   Temp 98.2 F (36.8 C) (Oral)   Resp 18   Wt 112.5 kg   SpO2 96%   BMI 29.41 kg/m   General Appearance:    Alert, cooperative, no distress, appears stated age  Head:    Normocephalic, without obvious abnormality, atraumatic  Eyes:    PERRL, conjunctiva/corneas clear, EOM's intact, fundi    benign, both eyes       Ears:    Normal TM's and external ear canals, both ears  Nose:   Nares normal, septum midline, mucosa normal, no drainage    or sinus tenderness  Throat:   Lips, mucosa, and tongue normal; teeth and gums normal  Neck:   Supple, symmetrical, trachea midline, no adenopathy;       thyroid:  No enlargement/tenderness/nodules; no carotid   bruit or JVD  Back:     Symmetric, no curvature, ROM normal, no CVA tenderness  Lungs:     Clear to auscultation bilaterally, respirations unlabored  Chest wall:    No tenderness or deformity  Heart:    Regular rate and rhythm, S1 and S2 normal, no murmur, rub   or gallop  Abdomen:     Soft, non-tender, bowel sounds active all four quadrants,    no  masses, no organomegaly  Genitalia:  Normal male without lesion, discharge or tenderness  Rectal:    Normal tone, normal prostate, no masses or tenderness;   guaiac negative stool  Extremities:   Extremities normal, atraumatic, no cyanosis or edema  Pulses:   2+ and symmetric all extremities  Skin:   Skin color, texture, turgor normal, no rashes or lesions  Lymph nodes:   Cervical, supraclavicular, and axillary nodes normal  Neurologic:   CNII-XII intact. Normal strength, sensation and reflexes      throughout    Musculoskeletal:  ROM 0-120, Ligaments intact,  Imaging Review Plain radiographs demonstrate severe degenerative joint disease of the left knee. The overall alignment is neutral. The bone quality appears to be good for age and reported activity level.  Assessment/Plan: Primary osteoarthritis, left knee   The patient history, physical examination and imaging studies are consistent with advanced degenerative joint disease of the left knee. The patient has failed conservative treatment.  The clearance notes were reviewed.  After discussion with the patient it was felt that Total Knee Replacement was indicated. The procedure,  risks, and benefits of total knee arthroplasty were presented and reviewed. The risks including but not limited to aseptic loosening, infection, blood clots, vascular injury, stiffness, patella tracking problems complications among others were discussed. The patient acknowledged the explanation, agreed to proceed with the plan.  Preoperative templating of the joint replacement has been completed, documented, and submitted to the Operating Room personnel in order to optimize intra-operative equipment management.    Patient's anticipated LOS is less than 2 midnights, meeting these requirements: - Younger than 58 - Lives within 1 hour of care - Has a competent adult at home to recover with post-op recover - NO history of  - Chronic pain requiring opiods  -  Diabetes  - Coronary Artery Disease  - Heart failure  - Heart attack  - Stroke  - DVT/VTE  - Cardiac arrhythmia  - Respiratory Failure/COPD  - Renal failure  - Anemia  - Advanced Liver disease     Guy Sandifer 05/22/2023, 7:00 AM

## 2023-05-22 NOTE — Telephone Encounter (Signed)
Called regarding mychart messages and spoke with wife. Mason [name] will use Dilaudid Rx from surgeon for a few days until time for Oxycodone refill. She will call the office back when time for refill.

## 2023-05-22 NOTE — Evaluation (Signed)
Physical Therapy Evaluation Patient Details Name: Mason [name] MRN: [id number] DOB: [birth year] Today's Date: 05/22/2023  History of Present Illness  58 yo male presents to therapy s/p L TKA on 05/22/2023 due to failure of conservative measures. 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  Clinical Impression   Mason [name] is a 58 y.o. male POD 0 s/p L TKA. Patient reports IND with mobility at baseline. Patient reports IND with occasional use of SPC for prolonged distances with mobility at baseline. Patient is now limited by functional impairments (see PT problem list below) and requires CGA and ceus for transfers and gait with RW. Patient was able to ambulate 40 feet x 2 with RW and CGA and cues for safe walker management. Patient educated on safe sequencing for stair mobility, fall risk prevention, pain management and goals, use of CP/ice, car transfer pt and spouse verbalized understanding of safe guarding position for people assisting with mobility. Patient instructed in exercises to facilitate ROM and circulation reviewed and HO provided. Patient will benefit from continued skilled PT interventions to address impairments and progress towards PLOF. Patient has met mobility goals at adequate level for discharge home with spouse and OPPT services, CPM in home setting; will continue to follow if pt continues acute stay to progress towards Mod I goals.        If plan is discharge home, recommend the following: A little help with walking and/or transfers;A little help with bathing/dressing/bathroom;Assistance with cooking/housework;Assist for transportation;Help with stairs or ramp for entrance   Can travel by private vehicle        Equipment Recommendations None recommended by PT  Recommendations for Other Services       Functional Status Assessment Patient has had a  recent decline in their functional status and demonstrates the ability to make significant improvements in function in a reasonable and predictable amount of time.     Precautions / Restrictions Precautions Precautions: Posterior Hip;Fall      Mobility  Bed Mobility Overal bed mobility: Needs Assistance Bed Mobility: Supine to Sit     Supine to sit: HOB elevated, Supervision     General bed mobility comments: min cues    Transfers Overall transfer level: Needs assistance Equipment used: Rolling walker (2 wheels) Transfers: Sit to/from Stand Sit to Stand: Contact guard assist           General transfer comment: min cues pull to stand    Ambulation/Gait Ambulation/Gait assistance: Contact guard assist Gait Distance (Feet): 40 Feet Assistive device: Rolling walker (2 wheels) Gait Pattern/deviations: Step-to pattern, Antalgic Gait velocity: decreased'     General Gait Details: step almost through one episode of LOB and pt able to recover with CGA, min cues for safety, proper posture and RW managemend  Stairs Stairs: Yes Stairs assistance: Contact guard assist Stair Management: Two rails Number of Stairs: 2 General stair comments: pt required min cues for safety with step navigation, pt and spouse able to verbalize proper sequencing and use of SPC and one handrail to navigate steps in home setting  Wheelchair Mobility     Tilt Bed    Modified Rankin (Stroke Patients Only)       Balance Overall balance assessment: Needs assistance Sitting-balance support: Feet supported Sitting balance-Leahy Scale: Good     Standing balance support: Bilateral upper extremity supported, During functional activity, Reliant on  assistive device for balance Standing balance-Leahy Scale: Poor                               Pertinent Vitals/Pain Pain Assessment Pain Assessment: 0-10 Pain Score: 5  Pain Location: L knee and R knee a little Pain Descriptors /  Indicators: Aching, Constant, Discomfort, Dull, Grimacing, Operative site guarding Pain Intervention(s): Limited activity within patient's tolerance, Monitored during session, Premedicated before session, Repositioned, Ice applied    Home Living Family/patient expects to be discharged to:: Private residence Living Arrangements: Spouse/significant other Available Help at Discharge: Family Type of Home: Mobile home Home Access: Stairs to enter Entrance Stairs-Rails: Left Entrance Stairs-Number of Steps: 4   Home Layout: One level Home Equipment: Agricultural consultant (2 wheels);Toilet riser;Cane - single point      Prior Function Prior Level of Function : Independent/Modified Independent             Mobility Comments: IND with all ADLs, self care tasks and IADLs, SPC for longer outdoor or community navigation       Extremity/Trunk Assessment        Lower Extremity Assessment Lower Extremity Assessment: LLE deficits/detail LLE Deficits / Details: ankle DF/PF 5/5; SLR < 10 degree lag LLE Sensation: WNL (pt reported abn sensation B feet once in standing)    Cervical / Trunk Assessment Cervical / Trunk Assessment: Normal  Communication   Communication Communication: No apparent difficulties  Cognition Arousal: Alert Behavior During Therapy: WFL for tasks assessed/performed Overall Cognitive Status: Within Functional Limits for tasks assessed                                          General Comments      Exercises Total Joint Exercises Ankle Circles/Pumps: AROM, Both, 10 reps Quad Sets: AROM, Left, 5 reps Short Arc Quad: AROM, Left, 5 reps Heel Slides: AROM, Left, 5 reps Hip ABduction/ADduction: AROM, Left, 5 reps Straight Leg Raises: AROM, Left, 5 reps Knee Flexion: AROM, Left, 5 reps, Seated   Assessment/Plan    PT Assessment Patient needs continued PT services  PT Problem List Decreased strength;Decreased range of motion;Decreased activity  tolerance;Decreased balance;Decreased coordination;Decreased mobility;Pain       PT Treatment Interventions Gait training;DME instruction;Stair training;Functional mobility training;Therapeutic activities;Therapeutic exercise;Balance training;Neuromuscular re-education;Patient/family education;Modalities    PT Goals (Current goals can be found in the Care Plan section)  Acute Rehab PT Goals Patient Stated Goal: to be able to play with the kids, stoop down, get things off the floor and from under the bed and just move normally again PT Goal Formulation: With patient Time For Goal Achievement: 06/05/23 Potential to Achieve Goals: Fair    Frequency 7X/week     Co-evaluation               AM-PAC PT "6 Clicks" Mobility  Outcome Measure Help needed turning from your back to your side while in a flat bed without using bedrails?: None Help needed moving from lying on your back to sitting on the side of a flat bed without using bedrails?: None Help needed moving to and from a bed to a chair (including a wheelchair)?: A Little Help needed standing up from a chair using your arms (e.g., wheelchair or bedside chair)?: A Little Help needed to walk in hospital room?: A Little Help needed climbing 3-5  steps with a railing? : A Little 6 Click Score: 20    End of Session Equipment Utilized During Treatment: Gait belt Activity Tolerance: Patient tolerated treatment well Patient left: in chair;with call bell/phone within reach;with family/visitor present Nurse Communication: Mobility status;Other (comment) (pt readiness for d/c from PT perspective) PT Visit Diagnosis: Unsteadiness on feet (R26.81);Other abnormalities of gait and mobility (R26.89);Muscle weakness (generalized) (M62.81);Difficulty in walking, not elsewhere classified (R26.2);Pain Pain - Right/Left: Left Pain - part of body: Knee;Leg    Time: 1213-1252 PT Time Calculation (min) (ACUTE ONLY): 39 min   Charges:   PT  Evaluation $PT Eval Low Complexity: 1 Low PT Treatments $Gait Training: 8-22 mins $Therapeutic Exercise: 8-22 mins PT General Charges $$ ACUTE PT VISIT: 1 Visit         Johnny Bridge, PT Acute Rehab   Jacqualyn Posey 05/22/2023, 2:39 PM

## 2023-05-22 NOTE — Progress Notes (Signed)
Orthopedic Tech Progress Note Patient Details:  Mason [name] 1965/08/23 [id number]  Patient ID: [name], male   DOB: Apr 15, 1966, 58 y.o.   MRN: [id number] Patient decline bone foam, as they recently had their other knee operated on and still have a bone foam from their previous surgery.  Darleen Crocker 05/22/2023, 9:50 AM

## 2023-05-23 ENCOUNTER — Other Ambulatory Visit: Payer: Self-pay

## 2023-05-23 ENCOUNTER — Encounter (HOSPITAL_COMMUNITY): Payer: Self-pay | Admitting: Orthopedic Surgery

## 2023-05-24 ENCOUNTER — Encounter: Payer: Self-pay | Admitting: Hematology and Oncology

## 2023-05-24 ENCOUNTER — Other Ambulatory Visit: Payer: Self-pay | Admitting: Hematology and Oncology

## 2023-05-24 ENCOUNTER — Telehealth: Payer: Self-pay

## 2023-05-24 ENCOUNTER — Other Ambulatory Visit (HOSPITAL_COMMUNITY): Payer: Self-pay

## 2023-05-24 MED ORDER — OXYCODONE HCL 15 MG PO TABS
15.0000 mg | ORAL_TABLET | Freq: Four times a day (QID) | ORAL | 0 refills | Status: DC | PRN
  Filled 2023-05-24: qty 90, 23d supply, fill #0

## 2023-05-24 NOTE — Telephone Encounter (Signed)
 Called pt per MD to give message below. She verbalized understanding and states he has cardiology appt in March. Advised she may want to call or send MyChart message to them in the meantime to see if there are any recommendations for holding antihypertensives per B/P reading. She verbalized agreement and states she will contact the cardiologist.

## 2023-05-24 NOTE — Telephone Encounter (Signed)
-----   Message from Almeda Jacobs sent at 05/24/2023 10:50 AM EST ----- I refilled oxycodone  He is on high dose metoprolol  and imdur  Metoprolol  needs tapering I suggest his wife contact cardiologist Dr. Avanell Bob office for instruction of how to taper

## 2023-06-12 ENCOUNTER — Other Ambulatory Visit: Payer: Self-pay | Admitting: Hematology and Oncology

## 2023-06-12 ENCOUNTER — Other Ambulatory Visit (HOSPITAL_COMMUNITY): Payer: Self-pay

## 2023-06-12 ENCOUNTER — Other Ambulatory Visit: Payer: Self-pay | Admitting: Internal Medicine

## 2023-06-13 ENCOUNTER — Other Ambulatory Visit: Payer: Self-pay

## 2023-06-13 ENCOUNTER — Other Ambulatory Visit (HOSPITAL_COMMUNITY): Payer: Self-pay

## 2023-06-13 MED ORDER — METOPROLOL SUCCINATE ER 200 MG PO TB24
200.0000 mg | ORAL_TABLET | Freq: Every day | ORAL | 2 refills | Status: DC
Start: 2023-06-13 — End: 2023-07-06
  Filled 2023-06-13: qty 90, 90d supply, fill #0

## 2023-06-13 MED ORDER — OXYCODONE HCL 15 MG PO TABS
15.0000 mg | ORAL_TABLET | Freq: Four times a day (QID) | ORAL | 0 refills | Status: DC | PRN
Start: 2023-06-13 — End: 2023-06-14

## 2023-06-13 MED ORDER — SPIRONOLACTONE 25 MG PO TABS
12.5000 mg | ORAL_TABLET | Freq: Every day | ORAL | 2 refills | Status: DC
  Filled 2023-06-13: qty 45, 90d supply, fill #0
  Filled 2023-09-07: qty 45, 90d supply, fill #1
  Filled 2023-12-04 (×2): qty 45, 90d supply, fill #2

## 2023-06-14 ENCOUNTER — Other Ambulatory Visit: Payer: Self-pay | Admitting: Hematology and Oncology

## 2023-06-14 ENCOUNTER — Other Ambulatory Visit (HOSPITAL_COMMUNITY): Payer: Self-pay

## 2023-06-14 ENCOUNTER — Encounter (HOSPITAL_COMMUNITY): Payer: Self-pay

## 2023-06-14 ENCOUNTER — Other Ambulatory Visit: Payer: Self-pay

## 2023-06-14 MED ORDER — OXYCODONE HCL 15 MG PO TABS: 15.0000 mg | ORAL_TABLET | Freq: Four times a day (QID) | ORAL | 0 refills | Status: DC | PRN

## 2023-06-14 MED ORDER — OXYCODONE HCL 15 MG PO TABS
15.0000 mg | ORAL_TABLET | Freq: Four times a day (QID) | ORAL | 0 refills | Status: DC | PRN
Start: 2023-06-14 — End: 2023-07-05
  Filled 2023-06-14 – 2023-06-15 (×2): qty 90, 23d supply, fill #0

## 2023-06-15 ENCOUNTER — Other Ambulatory Visit (HOSPITAL_COMMUNITY): Payer: Self-pay

## 2023-07-05 ENCOUNTER — Other Ambulatory Visit: Payer: Self-pay | Admitting: Internal Medicine

## 2023-07-05 ENCOUNTER — Other Ambulatory Visit: Payer: Self-pay | Admitting: Hematology and Oncology

## 2023-07-05 NOTE — Progress Notes (Unsigned)
 Cardiology Office Note    Patient Name: Mason [name] Date of Encounter: 07/06/2023  Primary Care Provider:  French Ana, FNP Primary Cardiologist:  Dietrich Pates, MD Primary Electrophysiologist: None   Past Medical History    Past Medical History:  Diagnosis Date   Abnormal liver function test    Acute sinusitis, unspecified 05/18/2015   Anemia    Anxiety    mild new dx   Arthritis    knees,hips   Bilateral edema of lower extremity    Complication of anesthesia    Pt stated " my oxygen level was slow in rising."   Concussion    Hx: in high school x 2   Constipation    Coronary artery disease    Depression    Dysuria 09/04/2015   Fever    GERD (gastroesophageal reflux disease)    Hyperactive gag reflex    Hypertension    Hypoglycemia    Hypokalemia    Hypothyroidism    Insomnia 06/15/2015   Knee pain, chronic    Malnutrition (HCC)    Non-healing surgical wound 05/23/2014   PEG (percutaneous endoscopic gastrostomy) status (HCC)    Renal failure, acute (HCC)    CKD3   S/P radiation therapy 08/19/2013-10/15/2013   Right Tonstil and bilateral neck / 70 Gy in 35 fractions to gross disease, 63 Gy in 35 fractions to high risk nodal echelons, and 56 Gy in 35 fractions to intermediate risk nodal echelons   Severe nausea and vomiting    Status post chemotherapy    Only received 2 doses due to uncontrolled nausea and acute renal failure   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, recurrent 2016   Vitamin B12 deficiency 01/07/2022    History of Present Illness  Mason [name]  is a 58 year old male with a PMH of HFrEF, NICM, tonsillar CA s/p chemo and radiation now in remission, drug-induced pneumonitis, COPD, lung nodules, HTN, CKD stage III who presents today for overdue follow-up.  Mason [name] was last seen on 02/01/2023 and during visit reported doing well with no recurrence of chest pain.  He was noted to have elevated BP with initial blood  pressure 140/60 and 136/72 on recheck.  He lost unintentional 8 pounds and denied any indiscretions with salt.  He underwent a right total knee arthroplasty on 02/20/2023.  He was seen by his oncologist on 04/27/2023 with no recurrence of cancer and was advised that he no longer needed surveillance imaging.  He underwent left total knee replacement on 05/22/2023.  Mason [name] presents today for follow-up with his wife.  He reports since his previous visit that he has been doing well with no new cardiac complaints.ypertension is well-controlled with the current medication regimen. He is taking metoprolol at a dose of 200 mg but wishes to reduce the dosage due to difficulty swallowing the large pill. No side effects from the medication are reported. Blood pressure was initially high but has since stabilized. No chest pain or shortness of breath. He has a history of heart failure and is compliant with his medications, including Entresto, which is crucial for heart function. He has experienced improvement in symptoms. He is currently taking several medications including metoprolol, Entresto, Zocor, Farxiga, and aspirin. He takes his thyroid medication on an empty stomach and notes that if he goes too long without eating, he may vomit. No issues with diarrhea or constipation.  He has a history of alcohol use but reports cessation  since the end of October 2024. He has intentionally lost weight, currently weighing 242 pounds, down from 248 pounds in January 2025. He mentions a goal weight of 220 pounds. Patient denies chest pain, palpitations, dyspnea, PND, orthopnea, nausea, vomiting, dizziness, syncope, edema, weight gain, or early satiety.  Review of Systems  Please see the history of present illness.    All other systems reviewed and are otherwise negative except as noted above.  Physical Exam    Wt Readings from Last 3 Encounters:  07/06/23 242 lb (109.8 kg)  05/22/23 248 lb (112.5 kg)  05/15/23 248 lb  (112.5 kg)   VS: Vitals:   07/06/23 1540  BP: 118/70  Pulse: 87  SpO2: 98%  ,Body mass index is 27.97 kg/m. GEN: Well nourished, well developed in no acute distress Neck: No JVD; No carotid bruits Pulmonary: Clear to auscultation without rales, wheezing or rhonchi  Cardiovascular: Normal rate. Regular rhythm. Normal S1. Normal S2.   Murmurs: There is no murmur.  ABDOMEN: Soft, non-tender, non-distended EXTREMITIES:  No edema; No deformity   EKG/LABS/ Recent Cardiac Studies   ECG personally reviewed by me today -none completed today  Risk Assessment/Calculations:          Lab Results  Component Value Date   WBC 7.4 05/22/2023   HGB 13.4 05/22/2023   HCT 42.0 05/22/2023   MCV 95.2 05/22/2023   PLT 242 05/22/2023   Lab Results  Component Value Date   CREATININE 1.38 (H) 05/22/2023   BUN 13 05/22/2023   NA 137 05/22/2023   K 4.1 05/22/2023   CL 104 05/22/2023   CO2 26 05/22/2023   Lab Results  Component Value Date   CHOL 220 (H) 01/09/2023   HDL 71 01/09/2023   LDLCALC 122 (H) 01/09/2023   TRIG 154 (H) 01/09/2023   CHOLHDL 3.1 01/09/2023    Lab Results  Component Value Date   HGBA1C 5.7 08/09/2013   Assessment & Plan    1.HFrEF/NICM:  -2D echo completed on 10/2022 with improved EF of 55 to 60% and no RWMA with mild concentric LVH and grade 1 DD and no significant valve abnormalities. -Patient reports no chest pain or angina since previous visit. -Continue current GDMT with Entresto 49/51 mg twice daily, Farxiga 10 mg, Toprol-XL 100 mg, Imdur 30 mg, spironolactone 12.5 mg twice daily -Low sodium diet, fluid restriction <2L, and daily weights encouraged. Educated to contact our office for weight gain of 2 lbs overnight or 5 lbs in one week.   2. Mild nonobstructive CAD:  -Patient reports no chest pain or angina since previous follow-up. -Continue current GDMT with ASA 81 mg, Toprol 100 mg  3.Essential hypertension:  -Patient's blood pressure today was  stable at 118/70 -Continue Entresto 49/51 twice daily, spironolactone 12.5 mg daily, Toprol-XL 100 mg daily  4.History of tonsillar cancer: -Patient currently followed by Dr. Bertis Ruddy -Patient no longer needs annual surveillance for recurrence  5. CKD stage III:  -Patient's last creatinine was 1.3 -Continue follow-up with PCP  6. Hyperlipidemia:  Patient's last LDL cholesterol was elevated at 122 -Patient advised to the low sodium heart healthy diet -Continue Zocor 20 mg with plan to add ezetimibe if numbers are elevated at next check  Disposition: Follow-up with Dietrich Pates, MD or APP in 6 months    Signed, Napoleon Form, Leodis Rains, NP 07/06/2023, 3:51 PM  [facility]

## 2023-07-06 ENCOUNTER — Ambulatory Visit: Payer: Medicaid Other | Attending: Nurse Practitioner | Admitting: Nurse Practitioner

## 2023-07-06 ENCOUNTER — Other Ambulatory Visit: Payer: Self-pay

## 2023-07-06 ENCOUNTER — Other Ambulatory Visit (HOSPITAL_COMMUNITY): Payer: Self-pay

## 2023-07-06 ENCOUNTER — Encounter: Payer: Self-pay | Admitting: Nurse Practitioner

## 2023-07-06 VITALS — BP 118/70 | HR 87 | Ht 78.0 in | Wt 242.0 lb

## 2023-07-06 DIAGNOSIS — E782 Mixed hyperlipidemia: Secondary | ICD-10-CM | POA: Insufficient documentation

## 2023-07-06 DIAGNOSIS — I10 Essential (primary) hypertension: Secondary | ICD-10-CM | POA: Diagnosis present

## 2023-07-06 DIAGNOSIS — I5022 Chronic systolic (congestive) heart failure: Secondary | ICD-10-CM | POA: Insufficient documentation

## 2023-07-06 DIAGNOSIS — I428 Other cardiomyopathies: Secondary | ICD-10-CM | POA: Diagnosis present

## 2023-07-06 DIAGNOSIS — C099 Malignant neoplasm of tonsil, unspecified: Secondary | ICD-10-CM | POA: Diagnosis present

## 2023-07-06 MED ORDER — METOPROLOL SUCCINATE ER 100 MG PO TB24
100.0000 mg | ORAL_TABLET | Freq: Every day | ORAL | 5 refills | Status: DC
  Filled 2023-07-06: qty 30, 30d supply, fill #0
  Filled 2023-08-13: qty 30, 30d supply, fill #1
  Filled 2023-09-07: qty 30, 30d supply, fill #2
  Filled 2023-10-11: qty 30, 30d supply, fill #3
  Filled 2023-11-10: qty 30, 30d supply, fill #4
  Filled 2023-12-25: qty 30, 30d supply, fill #5

## 2023-07-06 MED ORDER — OXYCODONE HCL 15 MG PO TABS
15.0000 mg | ORAL_TABLET | Freq: Four times a day (QID) | ORAL | 0 refills | Status: DC | PRN
  Filled 2023-07-06: qty 90, 23d supply, fill #0

## 2023-07-06 NOTE — Patient Instructions (Signed)
 Medication Instructions:  DECREASE Metoprolol 100mg  Take 1 tablet once a day  *If you need a refill on your cardiac medications before your next appointment, please call your pharmacy*   Lab Work: None ordered If you have labs (blood work) drawn today and your tests are completely normal, you will receive your results only by: MyChart Message (if you have MyChart) OR A paper copy in the mail If you have any lab test that is abnormal or we need to change your treatment, we will call you to review the results.   Testing/Procedures: None ordered   Follow-Up: At [facility], you and your health needs are our priority.  As part of our continuing mission to provide you with exceptional heart care, we have created designated Provider Care Teams.  These Care Teams include your primary Cardiologist (physician) and Advanced Practice Providers (APPs -  Physician Assistants and Nurse Practitioners) who all work together to provide you with the care you need, when you need it.  We recommend signing up for the patient portal called "MyChart".  Sign up information is provided on this After Visit Summary.  MyChart is used to connect with patients for Virtual Visits (Telemedicine).  Patients are able to view lab/test results, encounter notes, upcoming appointments, etc.  Non-urgent messages can be sent to your provider as well.   To learn more about what you can do with MyChart, go to [email or web address].    Your next appointment:   6 month(s)  Provider:   Dietrich Pates, MD  or Robin Searing, NP   Other Instructions

## 2023-07-07 ENCOUNTER — Other Ambulatory Visit (HOSPITAL_COMMUNITY): Payer: Self-pay

## 2023-07-07 MED ORDER — ISOSORBIDE MONONITRATE ER 30 MG PO TB24
30.0000 mg | ORAL_TABLET | Freq: Every day | ORAL | 3 refills | Status: DC
  Filled 2023-07-07: qty 90, 90d supply, fill #0
  Filled 2023-10-11: qty 90, 90d supply, fill #1
  Filled 2023-11-10 – 2024-01-12 (×3): qty 90, 90d supply, fill #2
  Filled 2024-04-08: qty 90, 90d supply, fill #3

## 2023-07-20 ENCOUNTER — Encounter: Payer: Self-pay | Admitting: Hematology and Oncology

## 2023-07-21 ENCOUNTER — Telehealth: Payer: Self-pay

## 2023-07-21 NOTE — Telephone Encounter (Signed)
-----   Message from Artis Delay sent at 07/21/2023  8:53 AM EDT ----- Please call him or wife back He has been on thyroid medicine for years He should probably see PCP for eval of his symptoms or nausea

## 2023-07-21 NOTE — Telephone Encounter (Signed)
 Called Mason [name]'s phone and [name], wife answered phone. Given below message regarding mychart message. Wife verbalized understanding and will call PCP.

## 2023-07-26 ENCOUNTER — Other Ambulatory Visit: Payer: Self-pay | Admitting: Hematology and Oncology

## 2023-07-26 ENCOUNTER — Other Ambulatory Visit (HOSPITAL_COMMUNITY): Payer: Self-pay

## 2023-07-26 ENCOUNTER — Other Ambulatory Visit: Payer: Self-pay | Admitting: Nurse Practitioner

## 2023-07-26 MED ORDER — OXYCODONE HCL 15 MG PO TABS
15.0000 mg | ORAL_TABLET | Freq: Four times a day (QID) | ORAL | 0 refills | Status: DC | PRN
  Filled 2023-07-26: qty 90, 23d supply, fill #0
  Filled 2023-07-27: qty 80, 20d supply, fill #0
  Filled 2023-07-27: qty 10, 3d supply, fill #0

## 2023-07-26 MED ORDER — SIMVASTATIN 20 MG PO TABS
20.0000 mg | ORAL_TABLET | Freq: Every day | ORAL | 2 refills | Status: DC
  Filled 2023-07-26: qty 90, 90d supply, fill #0
  Filled 2023-11-10: qty 90, 90d supply, fill #1
  Filled 2024-01-22 – 2024-02-05 (×2): qty 90, 90d supply, fill #2

## 2023-07-27 ENCOUNTER — Other Ambulatory Visit (HOSPITAL_COMMUNITY): Payer: Self-pay

## 2023-07-27 ENCOUNTER — Other Ambulatory Visit: Payer: Self-pay

## 2023-08-13 ENCOUNTER — Other Ambulatory Visit: Payer: Self-pay | Admitting: Hematology and Oncology

## 2023-08-14 ENCOUNTER — Other Ambulatory Visit (HOSPITAL_COMMUNITY): Payer: Self-pay

## 2023-08-14 ENCOUNTER — Other Ambulatory Visit: Payer: Self-pay

## 2023-08-14 NOTE — Telephone Encounter (Signed)
 Mason [name] is aware. She said that you can just refill both prescriptions when it is due. She is asking for a refill on Friday 5/1.

## 2023-08-14 NOTE — Telephone Encounter (Signed)
 From what I calculated, he is not due for oxycodone  refill yet, So I will have to refuse this

## 2023-08-17 ENCOUNTER — Other Ambulatory Visit (HOSPITAL_COMMUNITY): Payer: Self-pay

## 2023-08-17 ENCOUNTER — Other Ambulatory Visit: Payer: Self-pay | Admitting: Hematology and Oncology

## 2023-08-17 MED ORDER — OXYCODONE HCL 15 MG PO TABS
15.0000 mg | ORAL_TABLET | Freq: Four times a day (QID) | ORAL | 0 refills | Status: DC | PRN
  Filled 2023-08-17: qty 90, 23d supply, fill #0

## 2023-08-17 MED ORDER — LEVOTHYROXINE SODIUM 100 MCG PO TABS
100.0000 ug | ORAL_TABLET | Freq: Every day | ORAL | 5 refills | Status: DC
  Filled 2023-09-07: qty 30, 30d supply, fill #0
  Filled 2023-10-11: qty 30, 30d supply, fill #1
  Filled 2023-11-10: qty 30, 30d supply, fill #2
  Filled 2023-12-25: qty 30, 30d supply, fill #3
  Filled 2024-01-22: qty 30, 30d supply, fill #4
  Filled 2024-02-27: qty 30, 30d supply, fill #5

## 2023-08-17 MED ORDER — OMEPRAZOLE 20 MG PO CPDR
20.0000 mg | DELAYED_RELEASE_CAPSULE | Freq: Every day | ORAL | 2 refills | Status: DC
  Filled 2023-08-17: qty 30, 30d supply, fill #0
  Filled 2023-09-07 (×2): qty 30, 30d supply, fill #1
  Filled 2023-10-11: qty 30, 30d supply, fill #2

## 2023-08-17 MED ORDER — CYANOCOBALAMIN 1000 MCG/ML IJ SOLN
1000.0000 ug | INTRAMUSCULAR | 0 refills | Status: DC
  Filled 2023-08-17: qty 10, 300d supply, fill #0

## 2023-08-22 ENCOUNTER — Other Ambulatory Visit (HOSPITAL_COMMUNITY): Payer: Self-pay

## 2023-08-26 ENCOUNTER — Other Ambulatory Visit (HOSPITAL_COMMUNITY): Payer: Self-pay

## 2023-08-29 ENCOUNTER — Encounter: Payer: Self-pay | Admitting: Hematology and Oncology

## 2023-09-01 ENCOUNTER — Other Ambulatory Visit (HOSPITAL_COMMUNITY): Payer: Self-pay

## 2023-09-07 ENCOUNTER — Other Ambulatory Visit (HOSPITAL_COMMUNITY): Payer: Self-pay

## 2023-09-07 ENCOUNTER — Other Ambulatory Visit: Payer: Self-pay | Admitting: Hematology and Oncology

## 2023-09-07 ENCOUNTER — Other Ambulatory Visit: Payer: Self-pay

## 2023-09-07 MED ORDER — OXYCODONE HCL 15 MG PO TABS
15.0000 mg | ORAL_TABLET | Freq: Four times a day (QID) | ORAL | 0 refills | Status: DC | PRN
  Filled 2023-09-07: qty 90, 23d supply, fill #0

## 2023-09-08 ENCOUNTER — Other Ambulatory Visit (HOSPITAL_COMMUNITY): Payer: Self-pay

## 2023-09-26 ENCOUNTER — Other Ambulatory Visit: Payer: Self-pay | Admitting: Hematology and Oncology

## 2023-09-27 ENCOUNTER — Other Ambulatory Visit (HOSPITAL_COMMUNITY): Payer: Self-pay

## 2023-09-27 MED ORDER — OXYCODONE HCL 15 MG PO TABS
15.0000 mg | ORAL_TABLET | Freq: Four times a day (QID) | ORAL | 0 refills | Status: DC | PRN
  Filled 2023-09-27: qty 90, 23d supply, fill #0

## 2023-10-11 ENCOUNTER — Other Ambulatory Visit: Payer: Self-pay

## 2023-10-17 ENCOUNTER — Other Ambulatory Visit: Payer: Self-pay | Admitting: Hematology and Oncology

## 2023-10-18 ENCOUNTER — Other Ambulatory Visit (HOSPITAL_COMMUNITY): Payer: Self-pay

## 2023-10-18 MED ORDER — OXYCODONE HCL 15 MG PO TABS
15.0000 mg | ORAL_TABLET | Freq: Four times a day (QID) | ORAL | 0 refills | Status: DC | PRN
  Filled 2023-10-18 – 2023-10-19 (×2): qty 90, 23d supply, fill #0

## 2023-10-19 ENCOUNTER — Other Ambulatory Visit (HOSPITAL_COMMUNITY): Payer: Self-pay

## 2023-10-26 ENCOUNTER — Inpatient Hospital Stay: Payer: Medicaid Other | Attending: Hematology and Oncology | Admitting: Hematology and Oncology

## 2023-10-26 ENCOUNTER — Telehealth: Payer: Self-pay | Admitting: Hematology and Oncology

## 2023-10-26 ENCOUNTER — Inpatient Hospital Stay: Payer: Medicaid Other

## 2023-10-26 ENCOUNTER — Encounter: Payer: Self-pay | Admitting: Hematology and Oncology

## 2023-10-26 VITALS — BP 112/70 | HR 71 | Temp 99.1°F | Resp 18 | Ht 78.0 in | Wt 224.4 lb

## 2023-10-26 DIAGNOSIS — N183 Chronic kidney disease, stage 3 unspecified: Secondary | ICD-10-CM | POA: Diagnosis not present

## 2023-10-26 DIAGNOSIS — R1310 Dysphagia, unspecified: Secondary | ICD-10-CM | POA: Insufficient documentation

## 2023-10-26 DIAGNOSIS — C099 Malignant neoplasm of tonsil, unspecified: Secondary | ICD-10-CM | POA: Insufficient documentation

## 2023-10-26 DIAGNOSIS — E538 Deficiency of other specified B group vitamins: Secondary | ICD-10-CM

## 2023-10-26 DIAGNOSIS — G8929 Other chronic pain: Secondary | ICD-10-CM | POA: Diagnosis not present

## 2023-10-26 DIAGNOSIS — R070 Pain in throat: Secondary | ICD-10-CM

## 2023-10-26 DIAGNOSIS — G6289 Other specified polyneuropathies: Secondary | ICD-10-CM | POA: Diagnosis not present

## 2023-10-26 DIAGNOSIS — Z79899 Other long term (current) drug therapy: Secondary | ICD-10-CM | POA: Insufficient documentation

## 2023-10-26 DIAGNOSIS — G620 Drug-induced polyneuropathy: Secondary | ICD-10-CM | POA: Insufficient documentation

## 2023-10-26 DIAGNOSIS — T451X5A Adverse effect of antineoplastic and immunosuppressive drugs, initial encounter: Secondary | ICD-10-CM | POA: Diagnosis not present

## 2023-10-26 DIAGNOSIS — G629 Polyneuropathy, unspecified: Secondary | ICD-10-CM | POA: Insufficient documentation

## 2023-10-26 DIAGNOSIS — E039 Hypothyroidism, unspecified: Secondary | ICD-10-CM

## 2023-10-26 DIAGNOSIS — R682 Dry mouth, unspecified: Secondary | ICD-10-CM | POA: Insufficient documentation

## 2023-10-26 LAB — CBC WITH DIFFERENTIAL/PLATELET
Abs Immature Granulocytes: 0.03 K/uL (ref 0.00–0.07)
Basophils Absolute: 0 K/uL (ref 0.0–0.1)
Basophils Relative: 0 %
Eosinophils Absolute: 0.2 K/uL (ref 0.0–0.5)
Eosinophils Relative: 2 %
HCT: 44.9 % (ref 39.0–52.0)
Hemoglobin: 14.2 g/dL (ref 13.0–17.0)
Immature Granulocytes: 0 %
Lymphocytes Relative: 9 %
Lymphs Abs: 1.1 K/uL (ref 0.7–4.0)
MCH: 28.1 pg (ref 26.0–34.0)
MCHC: 31.6 g/dL (ref 30.0–36.0)
MCV: 88.9 fL (ref 80.0–100.0)
Monocytes Absolute: 0.8 K/uL (ref 0.1–1.0)
Monocytes Relative: 7 %
Neutro Abs: 9.2 K/uL — ABNORMAL HIGH (ref 1.7–7.7)
Neutrophils Relative %: 82 %
Platelets: 276 K/uL (ref 150–400)
RBC: 5.05 MIL/uL (ref 4.22–5.81)
RDW: 13.5 % (ref 11.5–15.5)
WBC: 11.3 K/uL — ABNORMAL HIGH (ref 4.0–10.5)
nRBC: 0 % (ref 0.0–0.2)

## 2023-10-26 LAB — COMPREHENSIVE METABOLIC PANEL WITH GFR
ALT: 7 U/L (ref 0–44)
AST: 14 U/L — ABNORMAL LOW (ref 15–41)
Albumin: 3.9 g/dL (ref 3.5–5.0)
Alkaline Phosphatase: 87 U/L (ref 38–126)
Anion gap: 6 (ref 5–15)
BUN: 13 mg/dL (ref 6–20)
CO2: 30 mmol/L (ref 22–32)
Calcium: 9.5 mg/dL (ref 8.9–10.3)
Chloride: 104 mmol/L (ref 98–111)
Creatinine, Ser: 1.51 mg/dL — ABNORMAL HIGH (ref 0.61–1.24)
GFR, Estimated: 54 mL/min — ABNORMAL LOW (ref >60)
Glucose, Bld: 80 mg/dL (ref 70–99)
Potassium: 4.3 mmol/L (ref 3.5–5.1)
Sodium: 140 mmol/L (ref 135–145)
Total Bilirubin: 0.4 mg/dL (ref 0.0–1.2)
Total Protein: 7.4 g/dL (ref 6.5–8.1)

## 2023-10-26 LAB — TSH: TSH: 0.65 u[IU]/mL (ref 0.350–4.500)

## 2023-10-26 LAB — VITAMIN B12: Vitamin B-12: 331 pg/mL (ref 180–914)

## 2023-10-26 NOTE — Assessment & Plan Note (Addendum)
 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 He would like to see ENT physician to consider esophageal dilation I recommend the patient to reach out to the ENT physician who saw him 2 years ago

## 2023-10-26 NOTE — Telephone Encounter (Signed)
 Spoke with patient confirming upcoming appointment

## 2023-10-26 NOTE — Assessment & Plan Note (Addendum)
 He has chronic throat pain related to prior treatment I refilled his prescription pain medicine last week

## 2023-10-26 NOTE — Assessment & Plan Note (Addendum)
He has intermittent elevated serum creatinine Observe closely

## 2023-10-26 NOTE — Assessment & Plan Note (Addendum)
 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

## 2023-10-26 NOTE — Progress Notes (Signed)
 [facility] OFFICE PROGRESS NOTE  Patient Care Team: Jerlean Sharper, FNP as PCP - General (Family Medicine) Okey Vina GAILS, MD as PCP - Cardiology (Cardiology) Patient, [name] (General Practice) Lazaro Glatter, MD as Referring Physician (Otolaryngology) Lonn Hicks, MD as Consulting Physician (Hematology and Oncology) Patient, [name] (General Practice)  Assessment & Plan Tonsillar cancer [facility]) The patient was originally diagnosed with locally advanced right tonsil cancer, stage IV disease with regional lymphadenopathy in March 2015 Final pathology: Squamous cell carcinoma, HPV positive He was treated aggressively with concurrent chemoradiation therapy, completed by 2015 He was found to have disease relapse in 2016, treated with combination of carboplatin  and paclitaxel  followed by pembrolizumab .  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 He would like to see ENT physician to consider esophageal dilation I recommend the patient to reach out to the ENT physician who saw him 2 years ago Throat pain in adult He has chronic throat pain related to prior treatment I refilled his prescription pain medicine last week Stage 3 chronic kidney disease, unspecified whether stage 3a or 3b CKD (HCC) He has intermittent elevated serum creatinine Observe closely Other polyneuropathy 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  but that was many years ago He complained of worsening peripheral neuropathy now I recommend consultation with neuro oncologist and he agreed  Orders Placed This Encounter  Procedures   Vitamin B12    Standing Status:   Future    Number of Occurrences:   1    Expiration Date:   10/25/2024   Amb Referral to Neuro Oncology    Referral  Priority:   Routine    Referral Type:   Consultation    Referral Reason:   Specialty Services Required    Number of Visits Requested:   1     Hicks Lonn, MD  INTERVAL HISTORY: he returns for surveillance follow-up for high risk stage IV tonsil cancer Since last time I saw him, he has lost a lot of weight He thinks his weight loss was related to some new medications that was prescribed by his other physician He denies new oral lesions He complain of chronic dry mouth He has complained of some dysphagia and is wondering whether he can have esophageal dilation He has not seen his ear nose and throat physician for 2 years He also complained of slight worsening neuropathy in his feet recently He denies smoking or alcohol  intake He desired neurology appointment referral His chronic pain appears somewhat stable although with his neuropathy pain, he is taking oxycodone  slightly more frequent than usual  PHYSICAL EXAMINATION: ECOG PERFORMANCE STATUS: 1 - Symptomatic but completely ambulatory  Vitals:   10/26/23 1324  BP: 112/70  Pulse: 71  Resp: 18  Temp: 99.1 F (37.3 C)  SpO2: 100%   Filed Weights   10/26/23 1324  Weight: 224 lb 6.4 oz (101.8 kg)   GENERAL:alert, no distress and comfortable SKIN: skin color, texture, turgor are normal, no rashes or significant lesions EYES: normal, conjunctiva are pink and non-injected, sclera clear OROPHARYNX:no exudate, no erythema and lips, buccal mucosa, and tongue normal  NECK: Obvious neck deformity from prior surgery.  Significant scar/fibrosis of his neck with limited range of motion LYMPH:  no palpable lymphadenopathy in the cervical, axillary or inguinal LUNGS:  clear to auscultation and percussion with normal breathing effort HEART: regular rate & rhythm and no murmurs and no lower extremity edema ABDOMEN:abdomen soft, non-tender and normal bowel sounds Musculoskeletal:no cyanosis of digits and no clubbing  PSYCH: alert & oriented x  3 with fluent speech  Relevant data reviewed during this visit included CBC, CMP and TSH

## 2023-11-10 ENCOUNTER — Other Ambulatory Visit (HOSPITAL_COMMUNITY): Payer: Self-pay

## 2023-11-10 ENCOUNTER — Other Ambulatory Visit: Payer: Self-pay

## 2023-11-10 ENCOUNTER — Other Ambulatory Visit: Payer: Self-pay | Admitting: Hematology and Oncology

## 2023-11-10 MED ORDER — OMEPRAZOLE 20 MG PO CPDR
20.0000 mg | DELAYED_RELEASE_CAPSULE | Freq: Every day | ORAL | 2 refills | Status: DC
  Filled 2023-11-10: qty 30, 30d supply, fill #0
  Filled 2023-12-25: qty 30, 30d supply, fill #1
  Filled 2024-01-22: qty 30, 30d supply, fill #2

## 2023-11-10 MED ORDER — OXYCODONE HCL 15 MG PO TABS
15.0000 mg | ORAL_TABLET | Freq: Four times a day (QID) | ORAL | 0 refills | Status: DC | PRN
  Filled 2023-11-10: qty 90, 23d supply, fill #0

## 2023-11-14 ENCOUNTER — Inpatient Hospital Stay (HOSPITAL_BASED_OUTPATIENT_CLINIC_OR_DEPARTMENT_OTHER): Admitting: Internal Medicine

## 2023-11-14 ENCOUNTER — Other Ambulatory Visit (HOSPITAL_COMMUNITY): Payer: Self-pay

## 2023-11-14 VITALS — BP 106/78 | HR 65 | Temp 97.2°F | Resp 20 | Wt 230.2 lb

## 2023-11-14 DIAGNOSIS — C099 Malignant neoplasm of tonsil, unspecified: Secondary | ICD-10-CM | POA: Diagnosis not present

## 2023-11-14 DIAGNOSIS — G620 Drug-induced polyneuropathy: Secondary | ICD-10-CM

## 2023-11-14 DIAGNOSIS — T451X5A Adverse effect of antineoplastic and immunosuppressive drugs, initial encounter: Secondary | ICD-10-CM

## 2023-11-14 MED ORDER — GABAPENTIN 300 MG PO CAPS
300.0000 mg | ORAL_CAPSULE | Freq: Two times a day (BID) | ORAL | 2 refills | Status: DC
  Filled 2023-11-14: qty 60, 30d supply, fill #0
  Filled 2023-12-25: qty 60, 30d supply, fill #1
  Filled 2024-02-05: qty 60, 30d supply, fill #2

## 2023-11-14 NOTE — Progress Notes (Signed)
 [facility] at [facility] [address]  [place], [place] [address] [phone]   New Patient Evaluation  Date of Service: 11/14/23 Patient Name: Mason [name] Patient MRN: [id number] Patient DOB: [birth year] Provider: Arthea MARLA Manns, MD  Identifying Statement:  Mason [name] is a 58 y.o. 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    Referring Provider: Jerlean Sharper, FNP [address] 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 FNA of right level III lymph node was inconclusive for cancer   07/25/2013 Imaging   PET scan showed locally advanced disease with abnormal lymphadenopathy in the right axilla   08/06/2013 Procedure   CT-guided biopsy of the lymphadenopathy was negative for malignancy   08/15/2013 Surgery   Patient has placement of port and feeding tube   08/19/2013 - 09/10/2013 Chemotherapy   Patient received chemotherapy with cisplatin . The patient only received 2 doses due to uncontrolled nausea and acute renal failure.   08/19/2013 - 10/15/2013 Radiation Therapy   Received Helical IMRT Tomotherapy:  Right Tonstil and bilateral neck / 70 Gy in 35 fractions to gross disease, 63 Gy in 35 fractions to high risk nodal echelons, and 56 Gy in 35 fractions to intermediate risk nodal echelons.   08/27/2013 - 08/30/2013 Hospital Admission   The patient  was admitted to the hospital for uncontrolled nausea vomiting and dehydration.   02/14/2014 Imaging   PET/CT scan showed complete response to treatment   03/19/2014 Surgery   He had excisional lymph node biopsy from the right neck. Pathology was benign   05/13/2014 Surgery   He had removal of Port-A-Cath.   05/22/2014 Imaging   Repeat CT scan of the neck show no evidence of disease recurrence.   12/09/2014 Imaging   Ct neck without contrast showed persistent abnormalities on the right side of neck, indeterminate   12/25/2014 Imaging   PET CT scan showed disease recurrence.   02/10/2015 Procedure   He has placement of port   02/13/2015 - 07/13/2015 Chemotherapy   He received chemotherapy with carbo/Taxol    04/21/2015 Imaging   PET CT scan showed improved disease control   08/04/2015 Imaging   PET scan showed persistent disease   08/17/2015 -  Chemotherapy   He was started with Keytruda    10/19/2015 Imaging   Ct neck showed mass-like intermediate density soft tissue at the right lateral neck recurrence site stable.    02/26/2016 Imaging   CT neck showed unchanged appearance of right neck recurrence compared to 10/19/2015 CT. No noncontrast evidence of new metastatic disease in the neck. Chronic sinusitis, progressed.   02/26/2016 Imaging   Diffuse but patchy and asymmetric partial airspace filling process (ground-glass opacity) in the lungs. This could be due to respiratory bronchiolitis, hypersensitivity pneumonitis or possible drug reaction. Atypical/viral pneumonia is also possible. Pulmonary consultation may be a helpful. A three-month follow-up noncontrast chest CT is suggested. Slight interval enlargement  of mediastinal lymph nodes and a small lymph node along the left major fissure. This is most likely due to the inflammatory process in the lungs. No findings for metastatic disease involving the chest. No findings for upper abdominal metastatic disease.   02/29/2016 Adverse Reaction    His treatment is placed on hold due to possible hypersensitivity pneumonitis/drug reaction   04/14/2016 Imaging   Ct chest showed no evidence for metastatic disease within the chest. Significant interval improvement and near complete resolution of previously described diffuse bilateral predominately ground-glass pulmonary opacities, most compatible with resolving infectious/inflammatory process.   09/05/2016 Imaging   CT scan of neck and chest  1. Unchanged appearance of right neck recurrence. 2. No evidence of new metastatic disease in the neck. 3. Unremarkable and stable CT appearance of the chest. No findings suspicious for metastatic disease   07/24/2017 Imaging   1. No findings suspicious for metastatic disease in the chest. 2. No acute consolidative airspace disease to suggest a pneumonia. 3. No appreciable change in chronic mild patchy upper lung predominant centrilobular micronodularity. If the patient is a current smoker, these findings are most compatible with smoking related interstitial lung disease. If the patient is not a current smoker, these findings suggest subacute hypersensitivity pneumonitis  or postinflammatory change. 4. Stable mild biapical radiation fibrosis. 5. Mild to moderate centrilobular emphysema with diffuse bronchial wall thickening, suggesting COPD. 6. One vessel coronary atherosclerosis.  Aortic Atherosclerosis (ICD10-I70.0) and Emphysema (ICD10-J43.9).   09/15/2017 Imaging   1. No evidence of interstitial lung disease. 2.  Emphysema (ICD10-J43.9).   10/29/2017 Imaging   1. Moderate quality exam for pulmonary embolism with primary limitation of motion. No embolism identified. 2. Findings of congestive heart failure, including bilateral pleural effusions and septal thickening. 3. New thoracic adenopathy since approximately 6 weeks ago. Favor secondary to fluid overload/congestive heart failure. 4. New left apical 4 mm pulmonary nodule. Favored to represent  a subpleural lymph node. Non-contrast chest CT can be considered in 12 months, given risk factors for primary bronchogenic carcinoma. This recommendation follows the consensus statement: Guidelines for Management of Incidental Pulmonary Nodules Detected on CT Images: From the Fleischner Society 2017; Radiology 2017; [id number].     10/29/2017 - 11/02/2017 Hospital Admission   He was admitted to the hospital due to shortness of breath and was found to have congestive heart failure.   10/30/2017 Imaging   Definity  used; severe global reduction in LV systolic function; severe LVE; restrictive filling; mild MR; mild LAE; mild RVE with moderate RV dysfunction; mild TR with moderate pulmonary hypertension.   01/26/2018 Imaging   1. Regression of soft tissue in the postoperative right neck favoring scarring. No new or progressive finding to suggest recurrent disease. 2. Chest CT reported separately   01/26/2018 Imaging   1. No evidence of thoracic metastasis. 2.  Port in the anterior chest wall with tip in distal    03/12/2018 Imaging   1. Trace, silent aspiration noted after a swallow consistent with aspiration of residual barium in the hypopharynx. 2. Retrograde reflux of contrast into the posterior nasopharynx with swallowing. 3. No gross esophageal abnormality 4. Initial difficulty swallowing a 13 mm barium tablet that than passes readily into the stomach once it is swallowed.   05/31/2018 Imaging   Resolution of previously seen mucosal hyperenhancement in the pharynx and larynx. Otherwise unchanged examination of the neck without evidence of recurrent disease or cervical nodal metastases.     06/07/2018 Echocardiogram   1. The left ventricle  has moderately reduced systolic function, with an ejection fraction of 35-40%. The cavity size was severely dilated. Left ventricular diastolic Doppler parameters are consistent with impaired relaxation Left ventricular diffuse hypokinesis.  2. The right  ventricle has normal systolic function. The cavity was moderately enlarged. There is no increase in right ventricular wall thickness.  3. The mitral valve is normal in structure.  4. The tricuspid valve is normal in structure.  5. The aortic valve is normal in structure.  6. The pulmonic valve was normal in structure.  7. The inferior vena cava was dilated in size with >50% respiratory variability.   02/24/2020 Imaging   Ct neck Postsurgical changes of the right side of the neck. No evidence of recurrent disease. No lymphadenopathy   02/24/2020 Imaging   1. No evidence of metastatic disease in the chest, abdomen, or pelvis. 2. Innumerable tiny centrilobular pulmonary nodules, most concentrated in the upper lobes, likely reflecting smoking-related respiratory bronchiolitis. 3. Hepatic steatosis. 4. Aortic Atherosclerosis (ICD10-I70.0).     10/01/2021 Imaging   CT neck  The visualized portions of the brain and the posterior fossa are normal.   Mucosal thickening of the right maxillary sinus. The orbits are normal. Trace secretions in the nasopharynx. The nasal cavity and nasopharynx are otherwise unremarkable.   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 Postsurgical soft tissue thickening on the right side of the neck along the posterior triangle, carotid sheath, and parapharyngeal/mucosal pharyngeal spaces. No new or enlarging enhancing mass seen. Mild asymmetric atrophy along the right tonsillar fossa, likely from treatment of the prior mass. No discrete lesion in the oropharynx. The oropharynx and oral cavity are otherwise unremarkable. The remaining parapharyngeal spaces are clear.   Mild atrophy of the submandibular glands. The parotid glands appear normal.   The larynx and hypopharynx are normal.   There is no lymphadenopathy.   The thyroid  gland is normal.   No acute osseous abnormality. No lytic or blastic osseous lesions. Degenerative changes of the  spine.   CT chest  No evidence for intrathoracic metastatic disease.   Mild diffuse bronchial wall thickening with scattered centrilobular groundglass nodules in bilateral upper lobes, may represent underlying respiratory bronchiolitis.    11/23/2021 Echocardiogram   Summary   1. Technically difficult study.    2. The left ventricle is upper normal in size with normal wall thickness.    3. The left ventricular systolic function is moderately to severely  decreased, LVEF is visually estimated at 35-40%.    4. There is grade I diastolic dysfunction (impaired relaxation).    5. The right ventricle is normal in size, with normal systolic function.    01/20/2022 Imaging   1. Stable emphysematous changes but no acute overlying pulmonary process or worrisome pulmonary lesions to suggest metastatic disease. 2. No mediastinal or hilar mass or adenopathy. 3. Diffuse fatty infiltration of the liver.   Emphysema (ICD10-J43.9).   01/20/2022 Imaging   Within the limitations of a non-contrast enhanced exam,   1. Postsurgical changes in the right neck without evidence of recurrent disease. There is persistent soft tissue thickening in the right level 2A lymph node station, which grossly appears unchanged compared to 02/24/2020 CT of the neck. 2. No new lymphadenopathy.     02/11/2022 Procedure   Successful removal of the right chest Port-A-Cath.       History of Present Illness: The patient's records from the referring physician were obtained and reviewed and the patient interviewed to confirm  this HPI.  Mason [name] presents today to review longstanding neuropathy symptoms.  He describes painful burning, tingling, as well as numbness in his feet and hands for several years following onset of chemotherapy for head and neck cancer starting in 2017.  He continues to follow with Dr. Lonn.  Medications: Current Outpatient Medications on File Prior to Visit  Medication Sig Dispense Refill    Apremilast (OTEZLA) 30 MG TABS Take 30 mg by mouth in the morning and at bedtime.     aspirin  EC 81 MG tablet Take 1 tablet (81 mg total) by mouth 2 (two) times daily. Swallow whole. 60 tablet 0   cholecalciferol (VITAMIN D3) 25 MCG (1000 UNIT) tablet Take 1,000 Units by mouth daily.     CREON 36000-114000 units CPEP capsule Take 72,000 Units by mouth 3 (three) times daily.     cyanocobalamin  (VITAMIN B12) 1000 MCG/ML injection Inject 1 mL (1,000 mcg total) into the muscle every 30 (thirty) days. 10 mL 0   dapagliflozin  propanediol (FARXIGA ) 10 MG TABS tablet Take 1 tablet (10 mg total) by mouth daily before breakfast. 90 tablet 2   isosorbide  mononitrate (IMDUR ) 30 MG 24 hr tablet Take 1 tablet (30 mg total) by mouth daily. 90 tablet 3   levothyroxine  (SYNTHROID ) 100 MCG tablet Take 1 tablet (100 mcg total) by mouth daily before breakfast. 30 tablet 5   metoprolol  succinate (TOPROL  XL) 100 MG 24 hr tablet Take 1 tablet (100 mg total) by mouth daily. 30 tablet 5   nitroGLYCERIN  (NITROSTAT ) 0.4 MG SL tablet Place 1 tablet (0.4 mg total) under the tongue every 5 (five) minutes as needed. 25 tablet 3   omeprazole  (PRILOSEC) 20 MG capsule Take 1 capsule (20 mg total) by mouth daily. 30 capsule 2   ondansetron  (ZOFRAN -ODT) 4 MG disintegrating tablet Take 1 tablet (4 mg total) by mouth every 8 (eight) hours as needed for nausea or vomiting. 20 tablet 0   oxyCODONE  (ROXICODONE ) 15 MG immediate release tablet Take 1 tablet by mouth every 6 hours as needed. 90 tablet 0   sacubitril -valsartan  (ENTRESTO ) 49-51 MG Take 1 tablet by mouth 2 (two) times daily. 60 tablet 8   simvastatin  (ZOCOR ) 20 MG tablet Take 1 tablet (20 mg total) by mouth at bedtime. 90 tablet 2   Skin Protectants, Misc. (MINERIN CREME) CREA Apply 1 Application topically daily as needed (rash).     spironolactone  (ALDACTONE ) 25 MG tablet Take 0.5 tablets (12.5 mg total) by mouth daily. 45 tablet 2   triamcinolone cream (KENALOG) 0.1 % Apply 1  Application topically daily as needed (eczema).     No current facility-administered medications on file prior to visit.    Allergies:  Allergies  Allergen Reactions   Bee Pollen     Watery eyes, runny nose, sneezing   Pollen Extract Other (See Comments)    Watery eyes, runny nose, sneezing   Past Medical History:  Past Medical History:  Diagnosis Date   Abnormal liver function test    Acute sinusitis, unspecified 05/18/2015   Anemia    Anxiety    mild new dx   Arthritis    knees,hips   Bilateral edema of lower extremity    Complication of anesthesia    Pt stated  my oxygen  level was slow in rising.   Concussion    Hx: in high school x 2   Constipation    Coronary artery disease    Depression    Dysuria 09/04/2015  Fever    GERD (gastroesophageal reflux disease)    Hyperactive gag reflex    Hypertension    Hypoglycemia    Hypokalemia    Hypothyroidism    Insomnia 06/15/2015   Knee pain, chronic    Malnutrition (HCC)    Non-healing surgical wound 05/23/2014   PEG (percutaneous endoscopic gastrostomy) status (HCC)    Renal failure, acute (HCC)    CKD3   S/P radiation therapy 08/19/2013-10/15/2013   Right Tonstil and bilateral neck / 70 Gy in 35 fractions to gross disease, 63 Gy in 35 fractions to high risk nodal echelons, and 56 Gy in 35 fractions to intermediate risk nodal echelons   Severe nausea and vomiting    Status post chemotherapy    Only received 2 doses due to uncontrolled nausea and acute renal failure   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, recurrent 2016   Vitamin B12 deficiency 01/07/2022   Past Surgical History:  Past Surgical History:  Procedure Laterality Date   IR REMOVAL TUN ACCESS W/ PORT W/O FL MOD SED  02/11/2022   LAPAROSCOPIC GASTROSTOMY N/A 08/15/2013   Procedure: LAPAROSCOPIC GASTROSTOMY TUBE PLACEMENT;  Surgeon: Lynda Leos, MD;  Location: [facility] OR;  Service: General;  Laterality: N/A;   LYMPH NODE BIOPSY   03/20/14   right neck   MULTIPLE EXTRACTIONS WITH ALVEOLOPLASTY N/A 08/01/2013   Procedure: Extraction of tooth #'s 1,15,17,31, 32 with alveoloplasty, mandibular left torus reduction, and gross debridement of remaining teeth.;  Surgeon: Mason JULIANNA Fanny, DDS;  Location: [facility] OR;  Service: Oral Surgery;  Laterality: N/A;   PORT-A-CATH REMOVAL N/A 05/13/2014   Procedure: REMOVAL of PORT-A-CATH;  Surgeon: Lynda Leos, MD;  Location: [facility] OR;  Service: General;  Laterality: N/A;   PORTACATH PLACEMENT Left 08/15/2013   Procedure: INSERTION PORT-A-CATH;  Surgeon: Lynda Leos, MD;  Location: [facility] OR;  Service: General;  Laterality: Left;   TOTAL KNEE ARTHROPLASTY Right 02/20/2023   Procedure: TOTAL KNEE ARTHROPLASTY;  Surgeon: Rubie Kemps, MD;  Location: [facility] ORS;  Service: Orthopedics;  Laterality: Right;   TOTAL KNEE ARTHROPLASTY Left 05/22/2023   Procedure: TOTAL KNEE ARTHROPLASTY;  Surgeon: Rubie Kemps, MD;  Location: [facility] ORS;  Service: Orthopedics;  Laterality: Left;   Social History:  Social History   Socioeconomic History   Marital status: Married    Spouse name: Not on file   Number of children: 1   Years of education: Not on file   Highest education level: Not on file  Occupational History   Not on file  Tobacco Use   Smoking status: Former    Current packs/day: 0.00    Average packs/day: 1 pack/day for 20.0 years (20.0 ttl pk-yrs)    Types: Cigarettes    Start date: 07/16/1983    Quit date: 07/16/2003    Years since quitting: 20.3   Smokeless tobacco: Never  Vaping Use   Vaping status: Never Used  Substance and Sexual Activity   Alcohol  use: No    Comment: none years ago   Drug use: Not Currently    Types: Marijuana, Benzodiazepines    Comment: years ago   Sexual activity: Yes  Other Topics Concern   Not on file  Social History Narrative   Not on file   Social Drivers of Health   Financial Resource Strain: Not on file  Food Insecurity: Food Insecurity Present (11/14/2023)    Hunger Vital Sign    Worried About Running Out of Food in the Last Year:  Sometimes true    Ran Out of Food in the Last Year: Sometimes true  Transportation Needs: No Transportation Needs (11/14/2023)   PRAPARE - Administrator, Civil Service (Medical): No    Lack of Transportation (Non-Medical): No  Physical Activity: Not on file  Stress: Not on file  Social Connections: Not on file  Intimate Partner Violence: Not At Risk (11/14/2023)   Humiliation, Afraid, Rape, and Kick questionnaire    Fear of Current or Ex-Partner: No    Emotionally Abused: No    Physically Abused: No    Sexually Abused: No   Family History:  Family History  Problem Relation Age of Onset   Arthritis Mother    Heart disease Mother    Arthritis Father    CVA Other    Diabetes Other    Heart attack Other    Heart disease Maternal Grandmother    CVA Maternal Grandmother     Review of Systems: Constitutional: Doesn't report fevers, chills or abnormal weight loss Eyes: Doesn't report blurriness of vision Ears, nose, mouth, throat, and face: Doesn't report sore throat Respiratory: Doesn't report cough, dyspnea or wheezes Cardiovascular: Doesn't report palpitation, chest discomfort  Gastrointestinal:  Doesn't report nausea, constipation, diarrhea GU: Doesn't report incontinence Skin: Doesn't report skin rashes Neurological: Per HPI Musculoskeletal: Doesn't report joint pain Behavioral/Psych: Doesn't report anxiety  Physical Exam: Vitals:   11/14/23 1208  BP: 106/78  Pulse: 65  Resp: 20  Temp: (!) 97.2 F (36.2 C)  SpO2: 99%   KPS: 90. General: Alert, cooperative, pleasant, in no acute distress Head: Normal EENT: No conjunctival injection or scleral icterus.  Lungs: Resp effort normal Cardiac: Regular rate Abdomen: Non-distended abdomen Skin: No rashes cyanosis or petechiae. Extremities: No clubbing or edema  Neurologic Exam: Mental Status: Awake, alert, attentive to examiner.  Oriented to self and environment. Language is fluent with intact comprehension.  Cranial Nerves: Visual acuity is grossly normal. Visual fields are full. Extra-ocular movements intact. No ptosis. Face is symmetric Motor: Tone and bulk are normal. Power is full in both arms and legs. Reflexes are symmetric, no pathologic reflexes present.  Sensory: Stocking sensory changes Gait: Normal.   Labs: I have reviewed the data as listed    Component Value Date/Time   NA 140 10/26/2023 1302   NA 137 06/28/2022 1258   NA 141 04/20/2017 1028   K 4.3 10/26/2023 1302   K 4.3 04/20/2017 1028   CL 104 10/26/2023 1302   CO2 30 10/26/2023 1302   CO2 26 04/20/2017 1028   GLUCOSE 80 10/26/2023 1302   GLUCOSE 96 04/20/2017 1028   BUN 13 10/26/2023 1302   BUN 10 06/28/2022 1258   BUN 16.7 04/20/2017 1028   CREATININE 1.51 (H) 10/26/2023 1302   CREATININE 1.65 (H) 01/06/2022 1408   CREATININE 2.0 (H) 04/20/2017 1028   CALCIUM 9.5 10/26/2023 1302   CALCIUM 9.0 04/20/2017 1028   PROT 7.4 10/26/2023 1302   PROT 6.4 01/09/2023 1341   PROT 7.0 04/20/2017 1028   ALBUMIN  3.9 10/26/2023 1302   ALBUMIN  4.0 01/09/2023 1341   ALBUMIN  4.0 04/20/2017 1028   AST 14 (L) 10/26/2023 1302   AST 35 01/06/2022 1408   AST 20 04/20/2017 1028   ALT 7 10/26/2023 1302   ALT 32 01/06/2022 1408   ALT 17 04/20/2017 1028   ALKPHOS 87 10/26/2023 1302   ALKPHOS 61 04/20/2017 1028   BILITOT 0.4 10/26/2023 1302   BILITOT 0.5 01/09/2023 1341  BILITOT 0.6 01/06/2022 1408   BILITOT 0.49 04/20/2017 1028   GFRNONAA 54 (L) 10/26/2023 1302   GFRNONAA 49 (L) 01/06/2022 1408   GFRAA 58 (L) 01/01/2020 1127   Lab Results  Component Value Date   WBC 11.3 (H) 10/26/2023   NEUTROABS 9.2 (H) 10/26/2023   HGB 14.2 10/26/2023   HCT 44.9 10/26/2023   MCV 88.9 10/26/2023   PLT 276 10/26/2023     Assessment/Plan Tonsillar cancer (HCC) - Plan: Ambulatory referral to Social Work  Chemotherapy-induced neuropathy (HCC)  Mason [name] presents with clinical syndrome consistent with symmetric, length dependent, small and large fiber peripheral neuropathy.  Etiology is exposure to platinum based chemotherapy.  We reviewed pathophysiology of chemotherapy induced neuropathy, available treatments, and goals of care.  He is agreeable with trial of gabapentin  300mg  BID for pain and burning symptoms.  He understands this will not likely help with the numbness.    We spent twenty additional minutes teaching regarding the natural history, biology, and historical experience in the treatment of neurologic complications of cancer.   We appreciate the opportunity to participate in the care of Mason [name].  He should follow up with us  in 3-6 months, or as needed, or with Dr. Lonn for further titration of neuropathic pain medications.  All questions were answered. The patient knows to call the clinic with any problems, questions or concerns. No barriers to learning were detected.  The total time spent in the encounter was 40 minutes and more than 50% was on counseling and review of test results   Arthea MARLA Manns, MD Medical Director of Neuro-Oncology [facility] at [facility] 11/14/23 12:28 PM

## 2023-11-16 ENCOUNTER — Inpatient Hospital Stay: Admitting: Licensed Clinical Social Worker

## 2023-11-16 DIAGNOSIS — C099 Malignant neoplasm of tonsil, unspecified: Secondary | ICD-10-CM

## 2023-11-16 NOTE — Progress Notes (Signed)
 [facility] CSW Progress Note  Clinical Child psychotherapist contacted patient by phone to follow-up on SDOH needs.    Interventions: Pt reports continued financial concerns as he has been unable to return to work since undergoing treatment and continues to wait for disability.  CSW emailed pt a list of food pantries and financial resources in his area.        Follow Up Plan:  Patient will contact CSW with any support or resource needs    Mason JONELLE Manna, LCSW Clinical Social Worker [facility]

## 2023-12-04 ENCOUNTER — Other Ambulatory Visit (HOSPITAL_COMMUNITY): Payer: Self-pay

## 2023-12-04 ENCOUNTER — Other Ambulatory Visit (HOSPITAL_BASED_OUTPATIENT_CLINIC_OR_DEPARTMENT_OTHER): Payer: Self-pay

## 2023-12-04 ENCOUNTER — Other Ambulatory Visit: Payer: Self-pay | Admitting: Hematology and Oncology

## 2023-12-04 MED ORDER — OXYCODONE HCL 15 MG PO TABS
15.0000 mg | ORAL_TABLET | Freq: Four times a day (QID) | ORAL | 0 refills | Status: DC | PRN
  Filled 2023-12-04 (×2): qty 90, 23d supply, fill #0

## 2023-12-25 ENCOUNTER — Other Ambulatory Visit: Payer: Self-pay | Admitting: Nurse Practitioner

## 2023-12-25 ENCOUNTER — Other Ambulatory Visit: Payer: Self-pay | Admitting: Hematology and Oncology

## 2023-12-25 ENCOUNTER — Other Ambulatory Visit (HOSPITAL_COMMUNITY): Payer: Self-pay

## 2023-12-25 MED ORDER — OXYCODONE HCL 15 MG PO TABS
15.0000 mg | ORAL_TABLET | Freq: Four times a day (QID) | ORAL | 0 refills | Status: DC | PRN
  Filled 2023-12-25: qty 90, 23d supply, fill #0

## 2023-12-26 ENCOUNTER — Other Ambulatory Visit (HOSPITAL_COMMUNITY): Payer: Self-pay

## 2023-12-26 ENCOUNTER — Other Ambulatory Visit: Payer: Self-pay

## 2023-12-26 MED ORDER — DAPAGLIFLOZIN PROPANEDIOL 10 MG PO TABS
10.0000 mg | ORAL_TABLET | Freq: Every day | ORAL | 2 refills | Status: AC
  Filled 2023-12-26 – 2024-01-12 (×2): qty 90, 90d supply, fill #0
  Filled 2024-04-08: qty 90, 90d supply, fill #1

## 2024-01-09 ENCOUNTER — Other Ambulatory Visit (HOSPITAL_COMMUNITY): Payer: Self-pay

## 2024-01-09 MED ORDER — CREON 36000-114000 UNITS PO CPEP
72000.0000 [IU] | ORAL_CAPSULE | Freq: Three times a day (TID) | ORAL | 11 refills | Status: AC
  Filled 2024-01-09: qty 200, 28d supply, fill #0
  Filled 2024-02-05: qty 300, 37d supply, fill #0
  Filled 2024-03-17: qty 300, 37d supply, fill #1
  Filled 2024-04-22: qty 300, 37d supply, fill #2

## 2024-01-12 ENCOUNTER — Other Ambulatory Visit (HOSPITAL_COMMUNITY): Payer: Self-pay

## 2024-01-12 ENCOUNTER — Other Ambulatory Visit: Payer: Self-pay

## 2024-01-12 ENCOUNTER — Other Ambulatory Visit: Payer: Self-pay | Admitting: Hematology and Oncology

## 2024-01-16 ENCOUNTER — Other Ambulatory Visit (HOSPITAL_COMMUNITY): Payer: Self-pay

## 2024-01-16 MED ORDER — OXYCODONE HCL 15 MG PO TABS
15.0000 mg | ORAL_TABLET | Freq: Four times a day (QID) | ORAL | 0 refills | Status: DC | PRN
  Filled 2024-01-16: qty 90, 23d supply, fill #0

## 2024-01-22 ENCOUNTER — Other Ambulatory Visit: Payer: Self-pay

## 2024-01-22 ENCOUNTER — Other Ambulatory Visit: Payer: Self-pay | Admitting: Nurse Practitioner

## 2024-01-23 ENCOUNTER — Other Ambulatory Visit (HOSPITAL_COMMUNITY): Payer: Self-pay

## 2024-01-23 MED ORDER — METOPROLOL SUCCINATE ER 100 MG PO TB24
100.0000 mg | ORAL_TABLET | Freq: Every day | ORAL | 5 refills | Status: AC
  Filled 2024-01-23: qty 30, 30d supply, fill #0
  Filled 2024-02-27: qty 30, 30d supply, fill #1
  Filled 2024-03-17 – 2024-03-25 (×2): qty 30, 30d supply, fill #2
  Filled 2024-04-25: qty 30, 30d supply, fill #3
  Filled 2024-05-22: qty 30, 30d supply, fill #4

## 2024-01-28 ENCOUNTER — Other Ambulatory Visit (HOSPITAL_COMMUNITY): Payer: Self-pay

## 2024-02-05 ENCOUNTER — Other Ambulatory Visit: Payer: Self-pay | Admitting: Hematology and Oncology

## 2024-02-06 ENCOUNTER — Other Ambulatory Visit (HOSPITAL_COMMUNITY): Payer: Self-pay

## 2024-02-06 ENCOUNTER — Other Ambulatory Visit: Payer: Self-pay

## 2024-02-06 ENCOUNTER — Encounter: Payer: Self-pay | Admitting: Internal Medicine

## 2024-02-06 MED ORDER — OXYCODONE HCL 15 MG PO TABS
15.0000 mg | ORAL_TABLET | Freq: Four times a day (QID) | ORAL | 0 refills | Status: DC | PRN
  Filled 2024-02-06: qty 90, 23d supply, fill #0

## 2024-02-13 ENCOUNTER — Telehealth: Payer: Self-pay | Admitting: Internal Medicine

## 2024-02-13 ENCOUNTER — Encounter: Payer: Self-pay | Admitting: Gastroenterology

## 2024-02-13 NOTE — Telephone Encounter (Signed)
 I spoke with patient's spouse regarding appointment on 02/20/2024 with Dr. Buckley. Patient spouse aware of date and time.

## 2024-02-19 ENCOUNTER — Telehealth: Payer: Self-pay | Admitting: *Deleted

## 2024-02-19 NOTE — Telephone Encounter (Signed)
 Received PC from patient's wife, Mason [name] - she states patient's appointment tomorrow needs to be changed, they have to come on a Monday or Friday.  Appointment rescheduled on 03/04/24 at 10:30, she verbalizes understanding.

## 2024-02-20 ENCOUNTER — Inpatient Hospital Stay: Admitting: Internal Medicine

## 2024-02-23 ENCOUNTER — Other Ambulatory Visit: Payer: Self-pay

## 2024-02-23 ENCOUNTER — Other Ambulatory Visit (HOSPITAL_COMMUNITY): Payer: Self-pay

## 2024-02-23 ENCOUNTER — Other Ambulatory Visit: Payer: Self-pay | Admitting: Hematology and Oncology

## 2024-02-23 ENCOUNTER — Ambulatory Visit (INDEPENDENT_AMBULATORY_CARE_PROVIDER_SITE_OTHER): Admitting: Gastroenterology

## 2024-02-23 ENCOUNTER — Other Ambulatory Visit (HOSPITAL_COMMUNITY): Payer: Self-pay | Admitting: *Deleted

## 2024-02-23 ENCOUNTER — Encounter: Payer: Self-pay | Admitting: Gastroenterology

## 2024-02-23 VITALS — BP 122/60 | HR 86 | Ht 79.0 in | Wt 214.2 lb

## 2024-02-23 DIAGNOSIS — Z1211 Encounter for screening for malignant neoplasm of colon: Secondary | ICD-10-CM | POA: Diagnosis not present

## 2024-02-23 DIAGNOSIS — R1310 Dysphagia, unspecified: Secondary | ICD-10-CM

## 2024-02-23 DIAGNOSIS — C099 Malignant neoplasm of tonsil, unspecified: Secondary | ICD-10-CM

## 2024-02-23 MED ORDER — OMEPRAZOLE 20 MG PO CPDR
20.0000 mg | DELAYED_RELEASE_CAPSULE | Freq: Every day | ORAL | 2 refills | Status: DC
  Filled 2024-02-23: qty 30, 30d supply, fill #0
  Filled 2024-03-17: qty 30, 30d supply, fill #1

## 2024-02-23 NOTE — Patient Instructions (Signed)
 _______________________________________________________  If your blood pressure at your visit was 140/90 or greater, please contact your primary care physician to follow up on this.  _______________________________________________________  If you are age 57 or older, your body mass index should be between 23-30. Your Body mass index is 24.14 kg/m. If this is out of the aforementioned range listed, please consider follow up with your Primary Care Provider.  If you are age 67 or younger, your body mass index should be between 19-25. Your Body mass index is 24.14 kg/m. If this is out of the aformentioned range listed, please consider follow up with your Primary Care Provider.   ________________________________________________________  The  GI providers would like to encourage you to use MYCHART to communicate with providers for non-urgent requests or questions.  Due to long hold times on the telephone, sending your provider a message by [facility] may be a faster and more efficient way to get a response.  Please allow 48 business hours for a response.  Please remember that this is for non-urgent requests.  _______________________________________________________  [facility] is using a team-based approach to care.  Your team is made up of your doctor and two to three APPS. Our APPS (Nurse Practitioners and Physician Assistants) work with your physician to ensure care continuity for you. They are fully qualified to address your health concerns and develop a treatment plan. They communicate directly with your gastroenterologist to care for you. Seeing the Advanced Practice Practitioners on your physician's team can help you by facilitating care more promptly, often allowing for earlier appointments, access to diagnostic testing, procedures, and other specialty referrals.   You have been scheduled for a modified barium swallow on 03-13-24 at 1pm. Please arrive 30 minutes prior to your  test for registration. You will go to [facility] (1st Floor) for your appointment. Should you need to cancel or reschedule your appointment, please contact [phone] Clora Eagle) or [phone] Geroge Long). _____________________________________________________________________ A Modified Barium Swallow Study, or MBS, is a special x-ray that is taken to check swallowing skills. It is carried out by a Marine Scientist and a Warehouse Manager (SLP). During this test, yourmouth, throat, and esophagus, a muscular tube which connects your mouth to your stomach, is checked. The test will help you, your doctor, and the SLP plan what types of foods and liquids are easier for you to swallow. The SLP will also identify positions and ways to help you swallow more easily and safely. What will happen during an MBS? You will be taken to an x-ray room and seated comfortably. You will be asked to swallow small amounts of food and liquid mixed with barium. Barium is a liquid or paste that allows images of your mouth, throat and esophagus to be seen on x-ray. The x-ray captures moving images of the food you are swallowing as it travels from your mouth through your throat and into your esophagus. This test helps identify whether food or liquid is entering your lungs (aspiration). The test also shows which part of your mouth or throat lacks strength or coordination to move the food or liquid in the right direction. This test typically takes 30 minutes to 1 hour to complete. _______________________________________________________________________  Rosine have been scheduled for an endoscopy. Please follow written instructions given to you at your visit today.  If you use inhalers (even only as needed), please bring them with you on the day of your procedure.  If you take any of the following medications, they will need  to be adjusted prior to your procedure:   DO NOT TAKE 7 DAYS PRIOR TO TEST- Trulicity  (dulaglutide) Ozempic, Wegovy (semaglutide) Mounjaro, Zepbound (tirzepatide) Bydureon Bcise (exanatide extended release)  DO NOT TAKE 1 DAY PRIOR TO YOUR TEST Rybelsus (semaglutide) Adlyxin (lixisenatide) Victoza (liraglutide) Byetta (exanatide) ___________________________________________________________________________  Due to recent changes in healthcare laws, you may see the results of your imaging and laboratory studies on MyChart before your provider has had a chance to review them.  We understand that in some cases there may be results that are confusing or concerning to you. Not all laboratory results come back in the same time frame and the provider may be waiting for multiple results in order to interpret others.  Please give us  48 hours in order for your provider to thoroughly review all the results before contacting the office for clarification of your results.   It was a pleasure to see you today!  Vito Cirigliano, D.O.

## 2024-02-23 NOTE — Progress Notes (Signed)
 Chief Complaint: Dysphagia   Referring Provider:     Jerlean Sharper, FNP    HPI:     Mason [name] is a 58 y.o. 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  Has been having progressive dysphagia over the last year. Now occurring with every meal. Worse with bread and rice.  He describes trouble transiting food from mouth into the esophagus with forceful swallows.  Also with thick secretions/saliva which are difficult to handle.  Has tried hard candies with nominal improvement in viscosity.    Reports a history of dysphagia and esophageal stricture requiring EGD with dilation around 2019 with improvement.  He states these of the same symptoms and improved with esophageal dilation then.  He cannot recall where this was done. - 03/12/2018: Esophagram: Trace, silent aspiration, retrograde reflux of contrast from oropharynx into posterior nasopharynx with swallowing.  Barium tablet passed readily into the stomach - No prior endoscopy records available for review.  EF 55-60% on most recent TTE from 09/2022.   Past Medical History:  Diagnosis Date   Abnormal liver function test    Acute sinusitis, unspecified 05/18/2015   Anemia    Anxiety    mild new dx   Arthritis    knees,hips   Bilateral edema of lower extremity    Complication of anesthesia    Pt stated  my oxygen  level was slow in rising.   Concussion    Hx: in high school x 2   Constipation    Coronary artery disease    Depression    Dysuria 09/04/2015   Fever    GERD (gastroesophageal reflux disease)    Hyperactive gag reflex    Hypertension    Hypoglycemia    Hypokalemia    Hypothyroidism    Insomnia 06/15/2015   Knee pain,  chronic    Malnutrition    Non-healing surgical wound 05/23/2014   PEG (percutaneous endoscopic gastrostomy) status (HCC)    Renal failure, acute    CKD3   S/P radiation therapy 08/19/2013-10/15/2013   Right Tonstil and bilateral neck / 70 Gy in 35 fractions to gross disease, 63 Gy in 35 fractions to high risk nodal echelons, and 56 Gy in 35 fractions to intermediate risk nodal echelons   Severe nausea and vomiting    Status post chemotherapy    Only received 2 doses due to uncontrolled nausea and acute renal failure   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, recurrent 2016   Vitamin B12 deficiency 01/07/2022     Past Surgical History:  Procedure Laterality Date   IR REMOVAL TUN ACCESS W/ PORT W/O FL MOD SED  02/11/2022   LAPAROSCOPIC GASTROSTOMY N/A 08/15/2013   Procedure: LAPAROSCOPIC GASTROSTOMY TUBE PLACEMENT;  Surgeon: Lynda Leos, MD;  Location: [facility] OR;  Service: General;  Laterality: N/A;   LYMPH NODE BIOPSY  03/20/14   right neck   MULTIPLE EXTRACTIONS WITH ALVEOLOPLASTY N/A 08/01/2013   Procedure: Extraction of tooth #'s 1,15,17,31, 32 with alveoloplasty, mandibular left torus reduction, and gross debridement of remaining teeth.;  Surgeon: Tanda JULIANNA Fanny, DDS;  Location: [facility] OR;  Service: Oral Surgery;  Laterality: N/A;   PORT-A-CATH REMOVAL N/A 05/13/2014   Procedure: REMOVAL of PORT-A-CATH;  Surgeon: Lynda Leos, MD;  Location: [facility] OR;  Service: General;  Laterality: N/A;   PORTACATH PLACEMENT Left 08/15/2013   Procedure: INSERTION PORT-A-CATH;  Surgeon: Lynda Leos, MD;  Location: [facility];  Service: General;  Laterality: Left;   TOTAL KNEE ARTHROPLASTY Right 02/20/2023   Procedure: TOTAL KNEE ARTHROPLASTY;  Surgeon: Rubie Kemps, MD;  Location: [facility] ORS;  Service: Orthopedics;  Laterality: Right;   TOTAL KNEE ARTHROPLASTY Left 05/22/2023   Procedure: TOTAL KNEE ARTHROPLASTY;  Surgeon: Rubie Kemps, MD;  Location: [facility] ORS;  Service: Orthopedics;  Laterality:  Left;   Family History  Problem Relation Age of Onset   Arthritis Mother    Heart disease Mother    Arthritis Father    CVA Other    Diabetes Other    Heart attack Other    Heart disease Maternal Grandmother    CVA Maternal Grandmother    Social History   Tobacco Use   Smoking status: Former    Current packs/day: 0.00    Average packs/day: 1 pack/day for 20.0 years (20.0 ttl pk-yrs)    Types: Cigarettes    Start date: 07/16/1983    Quit date: 07/16/2003    Years since quitting: 20.6   Smokeless tobacco: Never  Vaping Use   Vaping status: Never Used  Substance Use Topics   Alcohol  use: No    Comment: none years ago   Drug use: Not Currently    Types: Marijuana, Benzodiazepines    Comment: years ago   Current Outpatient Medications  Medication Sig Dispense Refill   Apremilast (OTEZLA) 30 MG TABS Take 30 mg by mouth in the morning and at bedtime.     aspirin  EC 81 MG tablet Take 1 tablet (81 mg total) by mouth 2 (two) times daily. Swallow whole. 60 tablet 0   cholecalciferol (VITAMIN D3) 25 MCG (1000 UNIT) tablet Take 1,000 Units by mouth daily.     CREON  36000-114000 units CPEP capsule Take 72,000 Units by mouth 3 (three) times daily.     cyanocobalamin  (VITAMIN B12) 1000 MCG/ML injection Inject 1 mL (1,000 mcg total) into the muscle every 30 (thirty) days. 10 mL 0   dapagliflozin  propanediol (FARXIGA ) 10 MG TABS tablet Take 1 tablet (10 mg total) by mouth daily before breakfast. 90 tablet 2   gabapentin  (NEURONTIN ) 300 MG capsule Take 1 capsule (300 mg total) by mouth 2 (two) times daily. 60 capsule 2   isosorbide  mononitrate (IMDUR ) 30 MG 24 hr tablet Take 1 tablet (30 mg total) by mouth daily. 90 tablet 3   levothyroxine  (SYNTHROID ) 100 MCG tablet Take 1 tablet (100 mcg total) by mouth daily before breakfast. 30 tablet 5   lipase/protease/amylase (CREON ) 36000 UNITS CPEP capsule TAKE 2 CAPSULES BY MOUTH WITH EACH MEAL AND 1 CAPSULE WITH SNACKS 210 capsule 11   metoprolol   succinate (TOPROL  XL) 100 MG 24 hr tablet Take 1 tablet (100 mg total) by mouth daily. 30 tablet 5   nitroGLYCERIN  (NITROSTAT ) 0.4 MG SL tablet Place 1 tablet (0.4 mg total) under the tongue every 5 (five) minutes as needed. 25 tablet 3   omeprazole  (PRILOSEC) 20 MG capsule Take 1 capsule (20 mg total) by mouth daily. 30 capsule 2   ondansetron  (ZOFRAN -ODT) 4 MG disintegrating tablet Take 1 tablet (4 mg total) by mouth every 8 (eight) hours as needed for nausea or vomiting. 20 tablet 0   oxyCODONE  (ROXICODONE ) 15 MG immediate release tablet Take 1 tablet by mouth every 6 hours as needed. 90  tablet 0   sacubitril -valsartan  (ENTRESTO ) 49-51 MG Take 1 tablet by mouth 2 (two) times daily. 60 tablet 8   simvastatin  (ZOCOR ) 20 MG tablet Take 1 tablet (20 mg total) by mouth at bedtime. 90 tablet 2   Skin Protectants, Misc. (MINERIN CREME) CREA Apply 1 Application topically daily as needed (rash).     spironolactone  (ALDACTONE ) 25 MG tablet Take 0.5 tablets (12.5 mg total) by mouth daily. 45 tablet 2   triamcinolone cream (KENALOG) 0.1 % Apply 1 Application topically daily as needed (eczema).     No current facility-administered medications for this visit.   Allergies  Allergen Reactions   Bee Pollen     Watery eyes, runny nose, sneezing   Pollen Extract Other (See Comments)    Watery eyes, runny nose, sneezing     Review of Systems: All systems reviewed and negative except where noted in HPI.     Physical Exam:    Wt Readings from Last 3 Encounters:  11/14/23 230 lb 3.2 oz (104.4 kg)  10/26/23 224 lb 6.4 oz (101.8 kg)  07/06/23 242 lb (109.8 kg)    There were no vitals taken for this visit. Constitutional:  Pleasant, in no acute distress. Psychiatric: Normal mood and affect. Behavior is normal. EENT: Pupils normal.  Conjunctivae are normal. No scleral icterus. Neck: Well-healed scar on right lateral neck.  No cervical LAD. Cardiovascular: Normal rate, regular rhythm. No  edema Pulmonary/chest: Effort normal and breath sounds normal. No wheezing, rales or rhonchi. Neurological: Alert and oriented to person place and time.   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  Seems to have at least some component of oropharyngeal dysphagia based on clinical description, but he reports having had an EGD with dilation in 2019 for similar symptoms with good response.  Symptoms are also complicated by thick secretions presumably 2/2 prior radiation affecting salivary glands.   Prior notes from outside facilities reviewed and was seen by ENT in 02/2018 with exam notable for significant fibrosis of the right neck from prior surgery and radiation.  I cannot see that he has had follow-up since then, nor can I find where he has had a dilation before.  We had an in-depth conversation today with plan for the following: - EGD to evaluate for esophageal pathology with esophageal dilation and/or biopsies as appropriate - Referral for modified barium swallow - Depending on results, will potentially refer to ENT  2) Colon cancer screening No previous colonoscopy.  Has completed FIT kit testing in the past per patient.  No recent lower GI symptoms.  Discussed CRC screening at length today.  He would like to address his more acute, pressing symptoms as above, and potentially proceed with optical colonoscopy afterwards as he has concerns about how well he would tolerate prep with his dysphagia.  Will discuss again at follow-up.  The indications, risks, and benefits of EGD were explained to the patient in detail. Risks include but are not limited to bleeding, perforation, adverse reaction to medications, and cardiopulmonary compromise. Sequelae include but are not limited to the possibility of  surgery, hospitalization, and mortality. The patient verbalized understanding and wished to proceed. All questions answered, referred to scheduler. Further recommendations pending results of the exam.      Sandor LULLA Flatter, DO, FACG  02/23/2024, 2:08 PM   Mason Ozell FALCON*

## 2024-02-27 ENCOUNTER — Other Ambulatory Visit: Payer: Self-pay

## 2024-02-27 ENCOUNTER — Other Ambulatory Visit (HOSPITAL_COMMUNITY): Payer: Self-pay

## 2024-02-27 ENCOUNTER — Other Ambulatory Visit: Payer: Self-pay | Admitting: Hematology and Oncology

## 2024-02-27 MED ORDER — OXYCODONE HCL 15 MG PO TABS
15.0000 mg | ORAL_TABLET | Freq: Four times a day (QID) | ORAL | 0 refills | Status: DC | PRN
  Filled 2024-02-27: qty 90, 23d supply, fill #0

## 2024-03-04 ENCOUNTER — Other Ambulatory Visit (HOSPITAL_COMMUNITY): Payer: Self-pay

## 2024-03-04 ENCOUNTER — Inpatient Hospital Stay: Attending: Internal Medicine | Admitting: Internal Medicine

## 2024-03-04 ENCOUNTER — Other Ambulatory Visit: Payer: Self-pay

## 2024-03-04 VITALS — BP 91/66 | HR 62 | Temp 97.8°F | Resp 17 | Ht 79.0 in | Wt 212.5 lb

## 2024-03-04 DIAGNOSIS — Z823 Family history of stroke: Secondary | ICD-10-CM | POA: Insufficient documentation

## 2024-03-04 DIAGNOSIS — M479 Spondylosis, unspecified: Secondary | ICD-10-CM | POA: Diagnosis not present

## 2024-03-04 DIAGNOSIS — R1310 Dysphagia, unspecified: Secondary | ICD-10-CM | POA: Insufficient documentation

## 2024-03-04 DIAGNOSIS — I130 Hypertensive heart and chronic kidney disease with heart failure and stage 1 through stage 4 chronic kidney disease, or unspecified chronic kidney disease: Secondary | ICD-10-CM | POA: Insufficient documentation

## 2024-03-04 DIAGNOSIS — Z91030 Bee allergy status: Secondary | ICD-10-CM | POA: Insufficient documentation

## 2024-03-04 DIAGNOSIS — Z7982 Long term (current) use of aspirin: Secondary | ICD-10-CM | POA: Diagnosis not present

## 2024-03-04 DIAGNOSIS — N183 Chronic kidney disease, stage 3 unspecified: Secondary | ICD-10-CM | POA: Diagnosis not present

## 2024-03-04 DIAGNOSIS — Z85818 Personal history of malignant neoplasm of other sites of lip, oral cavity, and pharynx: Secondary | ICD-10-CM | POA: Diagnosis not present

## 2024-03-04 DIAGNOSIS — R42 Dizziness and giddiness: Secondary | ICD-10-CM | POA: Insufficient documentation

## 2024-03-04 DIAGNOSIS — K760 Fatty (change of) liver, not elsewhere classified: Secondary | ICD-10-CM | POA: Diagnosis not present

## 2024-03-04 DIAGNOSIS — C099 Malignant neoplasm of tonsil, unspecified: Secondary | ICD-10-CM | POA: Insufficient documentation

## 2024-03-04 DIAGNOSIS — Z923 Personal history of irradiation: Secondary | ICD-10-CM | POA: Insufficient documentation

## 2024-03-04 DIAGNOSIS — G620 Drug-induced polyneuropathy: Secondary | ICD-10-CM | POA: Diagnosis not present

## 2024-03-04 DIAGNOSIS — J329 Chronic sinusitis, unspecified: Secondary | ICD-10-CM | POA: Insufficient documentation

## 2024-03-04 DIAGNOSIS — E039 Hypothyroidism, unspecified: Secondary | ICD-10-CM | POA: Insufficient documentation

## 2024-03-04 DIAGNOSIS — Z8249 Family history of ischemic heart disease and other diseases of the circulatory system: Secondary | ICD-10-CM | POA: Insufficient documentation

## 2024-03-04 DIAGNOSIS — Z87891 Personal history of nicotine dependence: Secondary | ICD-10-CM | POA: Diagnosis not present

## 2024-03-04 DIAGNOSIS — Z79899 Other long term (current) drug therapy: Secondary | ICD-10-CM | POA: Insufficient documentation

## 2024-03-04 DIAGNOSIS — J90 Pleural effusion, not elsewhere classified: Secondary | ICD-10-CM | POA: Diagnosis not present

## 2024-03-04 DIAGNOSIS — Z8589 Personal history of malignant neoplasm of other organs and systems: Secondary | ICD-10-CM | POA: Diagnosis not present

## 2024-03-04 DIAGNOSIS — Z833 Family history of diabetes mellitus: Secondary | ICD-10-CM | POA: Insufficient documentation

## 2024-03-04 DIAGNOSIS — Z931 Gastrostomy status: Secondary | ICD-10-CM | POA: Diagnosis not present

## 2024-03-04 DIAGNOSIS — Z79890 Hormone replacement therapy (postmenopausal): Secondary | ICD-10-CM | POA: Insufficient documentation

## 2024-03-04 DIAGNOSIS — I700 Atherosclerosis of aorta: Secondary | ICD-10-CM | POA: Insufficient documentation

## 2024-03-04 DIAGNOSIS — T451X5A Adverse effect of antineoplastic and immunosuppressive drugs, initial encounter: Secondary | ICD-10-CM | POA: Insufficient documentation

## 2024-03-04 DIAGNOSIS — I2510 Atherosclerotic heart disease of native coronary artery without angina pectoris: Secondary | ICD-10-CM | POA: Diagnosis not present

## 2024-03-04 DIAGNOSIS — Z8261 Family history of arthritis: Secondary | ICD-10-CM | POA: Insufficient documentation

## 2024-03-04 MED ORDER — PREGABALIN 50 MG PO CAPS
50.0000 mg | ORAL_CAPSULE | Freq: Two times a day (BID) | ORAL | 2 refills | Status: DC
  Filled 2024-03-04: qty 60, 30d supply, fill #0
  Filled 2024-04-08: qty 60, 30d supply, fill #1

## 2024-03-04 NOTE — Progress Notes (Signed)
 [facility] at [facility] [address]  [place], [place] [address] [phone]   Interval Evaluation  Date of Service: 03/04/24 Patient Name: Mason [name] Patient MRN: [id number] Patient DOB: February 03, 1966 Provider: Arthea MARLA Manns, MD  Identifying Statement:  Mason [name] is a 58 y.o. 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 FNA of right level III lymph node was inconclusive for cancer   07/25/2013 Imaging   PET scan showed locally advanced disease with abnormal lymphadenopathy in the right axilla   08/06/2013 Procedure   CT-guided biopsy of the lymphadenopathy was negative for malignancy   08/15/2013 Surgery   Patient has placement of port and feeding tube   08/19/2013 - 09/10/2013 Chemotherapy   Patient received chemotherapy with cisplatin . The patient only received 2 doses due to uncontrolled nausea and acute renal failure.   08/19/2013 - 10/15/2013 Radiation Therapy   Received Helical IMRT Tomotherapy:  Right Tonstil and bilateral neck / 70 Gy in 35 fractions to gross disease, 63 Gy in 35 fractions to high risk nodal echelons, and 56 Gy in 35 fractions to intermediate risk nodal echelons.   08/27/2013 - 08/30/2013 Hospital Admission   The patient was admitted to the hospital for uncontrolled nausea vomiting and dehydration.   02/14/2014 Imaging   PET/CT scan showed complete response to treatment   03/19/2014 Surgery   He had excisional lymph node biopsy from the right neck. Pathology was benign   05/13/2014  Surgery   He had removal of Port-A-Cath.   05/22/2014 Imaging   Repeat CT scan of the neck show no evidence of disease recurrence.   12/09/2014 Imaging   Ct neck without contrast showed persistent abnormalities on the right side of neck, indeterminate   12/25/2014 Imaging   PET CT scan showed disease recurrence.   02/10/2015 Procedure   He has placement of port   02/13/2015 - 07/13/2015 Chemotherapy   He received chemotherapy with carbo/Taxol    04/21/2015 Imaging   PET CT scan showed improved disease control   08/04/2015 Imaging   PET scan showed persistent disease   08/17/2015 -  Chemotherapy   He was started with Keytruda    10/19/2015 Imaging   Ct neck showed mass-like intermediate density soft tissue at the right lateral neck recurrence site stable.    02/26/2016 Imaging   CT neck showed unchanged appearance of right neck recurrence compared to 10/19/2015 CT. No noncontrast evidence of new metastatic disease in the neck. Chronic sinusitis, progressed.   02/26/2016 Imaging   Diffuse but patchy and asymmetric partial airspace filling process (ground-glass opacity) in the lungs. This could be due to respiratory bronchiolitis, hypersensitivity pneumonitis or possible drug reaction. Atypical/viral pneumonia is also possible. Pulmonary consultation may be a helpful. A three-month follow-up noncontrast chest CT is suggested. Slight interval enlargement of mediastinal lymph nodes and a small lymph node along the left major fissure. This is most likely due to the inflammatory process in the lungs. No findings for metastatic disease involving the chest. No findings for upper abdominal  metastatic disease.   02/29/2016 Adverse Reaction   His treatment is placed on hold due to possible hypersensitivity pneumonitis/drug reaction   04/14/2016 Imaging   Ct chest showed no evidence for metastatic disease within the chest. Significant interval improvement and near complete resolution of previously  described diffuse bilateral predominately ground-glass pulmonary opacities, most compatible with resolving infectious/inflammatory process.   09/05/2016 Imaging   CT scan of neck and chest  1. Unchanged appearance of right neck recurrence. 2. No evidence of new metastatic disease in the neck. 3. Unremarkable and stable CT appearance of the chest. No findings suspicious for metastatic disease   07/24/2017 Imaging   1. No findings suspicious for metastatic disease in the chest. 2. No acute consolidative airspace disease to suggest a pneumonia. 3. No appreciable change in chronic mild patchy upper lung predominant centrilobular micronodularity. If the patient is a current smoker, these findings are most compatible with smoking related interstitial lung disease. If the patient is not a current smoker, these findings suggest subacute hypersensitivity pneumonitis  or postinflammatory change. 4. Stable mild biapical radiation fibrosis. 5. Mild to moderate centrilobular emphysema with diffuse bronchial wall thickening, suggesting COPD. 6. One vessel coronary atherosclerosis.  Aortic Atherosclerosis (ICD10-I70.0) and Emphysema (ICD10-J43.9).   09/15/2017 Imaging   1. No evidence of interstitial lung disease. 2.  Emphysema (ICD10-J43.9).   10/29/2017 Imaging   1. Moderate quality exam for pulmonary embolism with primary limitation of motion. No embolism identified. 2. Findings of congestive heart failure, including bilateral pleural effusions and septal thickening. 3. New thoracic adenopathy since approximately 6 weeks ago. Favor secondary to fluid overload/congestive heart failure. 4. New left apical 4 mm pulmonary nodule. Favored to represent a subpleural lymph node. Non-contrast chest CT can be considered in 12 months, given risk factors for primary bronchogenic carcinoma. This recommendation follows the consensus statement: Guidelines for Management of Incidental Pulmonary Nodules Detected on CT  Images: From the Fleischner Society 2017; Radiology 2017; [id number].     10/29/2017 - 11/02/2017 Hospital Admission   He was admitted to the hospital due to shortness of breath and was found to have congestive heart failure.   10/30/2017 Imaging   Definity  used; severe global reduction in LV systolic function; severe LVE; restrictive filling; mild MR; mild LAE; mild RVE with moderate RV dysfunction; mild TR with moderate pulmonary hypertension.   01/26/2018 Imaging   1. Regression of soft tissue in the postoperative right neck favoring scarring. No new or progressive finding to suggest recurrent disease. 2. Chest CT reported separately   01/26/2018 Imaging   1. No evidence of thoracic metastasis. 2.  Port in the anterior chest wall with tip in distal    03/12/2018 Imaging   1. Trace, silent aspiration noted after a swallow consistent with aspiration of residual barium in the hypopharynx. 2. Retrograde reflux of contrast into the posterior nasopharynx with swallowing. 3. No gross esophageal abnormality 4. Initial difficulty swallowing a 13 mm barium tablet that than passes readily into the stomach once it is swallowed.   05/31/2018 Imaging   Resolution of previously seen mucosal hyperenhancement in the pharynx and larynx. Otherwise unchanged examination of the neck without evidence of recurrent disease or cervical nodal metastases.     06/07/2018 Echocardiogram   1. The left ventricle has moderately reduced systolic function, with an ejection fraction of 35-40%. The cavity size was severely dilated. Left ventricular diastolic Doppler parameters are consistent with impaired relaxation Left ventricular diffuse hypokinesis.  2. The right ventricle has normal systolic  function. The cavity was moderately enlarged. There is no increase in right ventricular wall thickness.  3. The mitral valve is normal in structure.  4. The tricuspid valve is normal in structure.  5. The aortic valve is normal  in structure.  6. The pulmonic valve was normal in structure.  7. The inferior vena cava was dilated in size with >50% respiratory variability.   02/24/2020 Imaging   Ct neck Postsurgical changes of the right side of the neck. No evidence of recurrent disease. No lymphadenopathy   02/24/2020 Imaging   1. No evidence of metastatic disease in the chest, abdomen, or pelvis. 2. Innumerable tiny centrilobular pulmonary nodules, most concentrated in the upper lobes, likely reflecting smoking-related respiratory bronchiolitis. 3. Hepatic steatosis. 4. Aortic Atherosclerosis (ICD10-I70.0).     10/01/2021 Imaging   CT neck  The visualized portions of the brain and the posterior fossa are normal.   Mucosal thickening of the right maxillary sinus. The orbits are normal. Trace secretions in the nasopharynx. The nasal cavity and nasopharynx are otherwise unremarkable.   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 Postsurgical soft tissue thickening on the right side of the neck along the posterior triangle, carotid sheath, and parapharyngeal/mucosal pharyngeal spaces. No new or enlarging enhancing mass seen. Mild asymmetric atrophy along the right tonsillar fossa, likely from treatment of the prior mass. No discrete lesion in the oropharynx. The oropharynx and oral cavity are otherwise unremarkable. The remaining parapharyngeal spaces are clear.   Mild atrophy of the submandibular glands. The parotid glands appear normal.   The larynx and hypopharynx are normal.   There is no lymphadenopathy.   The thyroid  gland is normal.   No acute osseous abnormality. No lytic or blastic osseous lesions. Degenerative changes of the spine.   CT chest  No evidence for intrathoracic metastatic disease.   Mild diffuse bronchial wall thickening with scattered centrilobular groundglass nodules in bilateral upper lobes, may represent underlying respiratory bronchiolitis.    11/23/2021  Echocardiogram   Summary   1. Technically difficult study.    2. The left ventricle is upper normal in size with normal wall thickness.    3. The left ventricular systolic function is moderately to severely  decreased, LVEF is visually estimated at 35-40%.    4. There is grade I diastolic dysfunction (impaired relaxation).    5. The right ventricle is normal in size, with normal systolic function.    01/20/2022 Imaging   1. Stable emphysematous changes but no acute overlying pulmonary process or worrisome pulmonary lesions to suggest metastatic disease. 2. No mediastinal or hilar mass or adenopathy. 3. Diffuse fatty infiltration of the liver.   Emphysema (ICD10-J43.9).   01/20/2022 Imaging   Within the limitations of a non-contrast enhanced exam,   1. Postsurgical changes in the right neck without evidence of recurrent disease. There is persistent soft tissue thickening in the right level 2A lymph node station, which grossly appears unchanged compared to 02/24/2020 CT of the neck. 2. No new lymphadenopathy.     02/11/2022 Procedure   Successful removal of the right chest Port-A-Cath.       Interval History: Mason [name] presents today for neuropathy follow up.  Gabapentin  has not helped his neuropathy symptoms, unfortunately.  It has made him feel dizzy and overall uncomfortable.  No other new or progressive changes otherwise.  H+P (11/14/23) Patient presents today to review longstanding neuropathy symptoms.  He describes painful burning, tingling, as well as numbness in  his feet and hands for several years following onset of chemotherapy for head and neck cancer starting in 2017.  He continues to follow with Dr. Lonn.  Medications: Current Outpatient Medications on File Prior to Visit  Medication Sig Dispense Refill   Apremilast (OTEZLA) 30 MG TABS Take 30 mg by mouth in the morning and at bedtime.     aspirin  EC 81 MG tablet Take 1 tablet (81 mg total) by mouth 2 (two)  times daily. Swallow whole. 60 tablet 0   cholecalciferol (VITAMIN D3) 25 MCG (1000 UNIT) tablet Take 1,000 Units by mouth daily.     clobetasol cream (TEMOVATE) 0.05 % Apply topically 2 (two) times daily as needed.     CREON  36000-114000 units CPEP capsule Take 72,000 Units by mouth 3 (three) times daily.     dapagliflozin  propanediol (FARXIGA ) 10 MG TABS tablet Take 1 tablet (10 mg total) by mouth daily before breakfast. 90 tablet 2   gabapentin  (NEURONTIN ) 300 MG capsule Take 1 capsule (300 mg total) by mouth 2 (two) times daily. 60 capsule 2   isosorbide  mononitrate (IMDUR ) 30 MG 24 hr tablet Take 1 tablet (30 mg total) by mouth daily. 90 tablet 3   levothyroxine  (SYNTHROID ) 100 MCG tablet Take 1 tablet (100 mcg total) by mouth daily before breakfast. 30 tablet 5   lidocaine -prilocaine  (EMLA ) cream Apply topically.     lipase/protease/amylase (CREON ) 36000 UNITS CPEP capsule TAKE 2 CAPSULES BY MOUTH WITH EACH MEAL AND 1 CAPSULE WITH SNACKS 210 capsule 11   metoprolol  succinate (TOPROL  XL) 100 MG 24 hr tablet Take 1 tablet (100 mg total) by mouth daily. 30 tablet 5   nitroGLYCERIN  (NITROSTAT ) 0.4 MG SL tablet Place 1 tablet (0.4 mg total) under the tongue every 5 (five) minutes as needed. 25 tablet 3   omeprazole  (PRILOSEC) 20 MG capsule Take 1 capsule (20 mg total) by mouth daily. 30 capsule 2   oxyCODONE  (ROXICODONE ) 15 MG immediate release tablet Take 1 tablet by mouth every 6 hours as needed. 90 tablet 0   sacubitril -valsartan  (ENTRESTO ) 49-51 MG Take 1 tablet by mouth 2 (two) times daily. 60 tablet 8   simvastatin  (ZOCOR ) 20 MG tablet Take 1 tablet (20 mg total) by mouth at bedtime. 90 tablet 2   Skin Protectants, Misc. (MINERIN CREME) CREA Apply 1 Application topically daily as needed (rash).     spironolactone  (ALDACTONE ) 25 MG tablet Take 0.5 tablets (12.5 mg total) by mouth daily. 45 tablet 2   triamcinolone cream (KENALOG) 0.1 % Apply 1 Application topically daily as needed (eczema).      No current facility-administered medications on file prior to visit.    Allergies:  Allergies  Allergen Reactions   Bee Pollen     Watery eyes, runny nose, sneezing   Pollen Extract Other (See Comments)    Watery eyes, runny nose, sneezing   Past Medical History:  Past Medical History:  Diagnosis Date   Abnormal liver function test    Acute sinusitis, unspecified 05/18/2015   Anemia    Anxiety    mild new dx   Arthritis    knees,hips   Bilateral edema of lower extremity    Complication of anesthesia    Pt stated  my oxygen  level was slow in rising.   Concussion    Hx: in high school x 2   Constipation    Coronary artery disease    Depression    Dysuria 09/04/2015   Fever    GERD (  gastroesophageal reflux disease)    Hyperactive gag reflex    Hypertension    Hypoglycemia    Hypokalemia    Hypothyroidism    Insomnia 06/15/2015   Knee pain, chronic    Malnutrition    Non-healing surgical wound 05/23/2014   PEG (percutaneous endoscopic gastrostomy) status (HCC)    Renal failure, acute    CKD3   S/P radiation therapy 08/19/2013-10/15/2013   Right Tonstil and bilateral neck / 70 Gy in 35 fractions to gross disease, 63 Gy in 35 fractions to high risk nodal echelons, and 56 Gy in 35 fractions to intermediate risk nodal echelons   Severe nausea and vomiting    Status post chemotherapy    Only received 2 doses due to uncontrolled nausea and acute renal failure   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, recurrent 2016   Vitamin B12 deficiency 01/07/2022   Past Surgical History:  Past Surgical History:  Procedure Laterality Date   IR REMOVAL TUN ACCESS W/ PORT W/O FL MOD SED  02/11/2022   LAPAROSCOPIC GASTROSTOMY N/A 08/15/2013   Procedure: LAPAROSCOPIC GASTROSTOMY TUBE PLACEMENT;  Surgeon: Lynda Leos, MD;  Location: [facility] OR;  Service: General;  Laterality: N/A;   LYMPH NODE BIOPSY  03/20/14   right neck   MULTIPLE EXTRACTIONS WITH ALVEOLOPLASTY  N/A 08/01/2013   Procedure: Extraction of tooth #'s 1,15,17,31, 32 with alveoloplasty, mandibular left torus reduction, and gross debridement of remaining teeth.;  Surgeon: Mason JULIANNA Fanny, DDS;  Location: [facility] OR;  Service: Oral Surgery;  Laterality: N/A;   PORT-A-CATH REMOVAL N/A 05/13/2014   Procedure: REMOVAL of PORT-A-CATH;  Surgeon: Lynda Leos, MD;  Location: [facility] OR;  Service: General;  Laterality: N/A;   PORTACATH PLACEMENT Left 08/15/2013   Procedure: INSERTION PORT-A-CATH;  Surgeon: Lynda Leos, MD;  Location: [facility] OR;  Service: General;  Laterality: Left;   TOTAL KNEE ARTHROPLASTY Right 02/20/2023   Procedure: TOTAL KNEE ARTHROPLASTY;  Surgeon: Rubie Kemps, MD;  Location: [facility] ORS;  Service: Orthopedics;  Laterality: Right;   TOTAL KNEE ARTHROPLASTY Left 05/22/2023   Procedure: TOTAL KNEE ARTHROPLASTY;  Surgeon: Rubie Kemps, MD;  Location: [facility] ORS;  Service: Orthopedics;  Laterality: Left;   Social History:  Social History   Socioeconomic History   Marital status: Married    Spouse name: Not on file   Number of children: 1   Years of education: Not on file   Highest education level: Not on file  Occupational History   Not on file  Tobacco Use   Smoking status: Former    Current packs/day: 0.00    Average packs/day: 1 pack/day for 20.0 years (20.0 ttl pk-yrs)    Types: Cigarettes    Start date: 07/16/1983    Quit date: 07/16/2003    Years since quitting: 20.6   Smokeless tobacco: Never  Vaping Use   Vaping status: Never Used  Substance and Sexual Activity   Alcohol  use: No    Comment: none years ago   Drug use: Not Currently    Types: Marijuana, Benzodiazepines    Comment: years ago   Sexual activity: Yes  Other Topics Concern   Not on file  Social History Narrative   Not on file   Social Drivers of Health   Financial Resource Strain: Not on file  Food Insecurity: Food Insecurity Present (11/14/2023)   Hunger Vital Sign    Worried About Running Out of Food in  the Last Year: Sometimes true    Ran Out  of Food in the Last Year: Sometimes true  Transportation Needs: No Transportation Needs (11/14/2023)   PRAPARE - Administrator, Civil Service (Medical): No    Lack of Transportation (Non-Medical): No  Physical Activity: Not on file  Stress: Not on file  Social Connections: Not on file  Intimate Partner Violence: Not At Risk (11/14/2023)   Humiliation, Afraid, Rape, and Kick questionnaire    Fear of Current or Ex-Partner: No    Emotionally Abused: No    Physically Abused: No    Sexually Abused: No   Family History:  Family History  Problem Relation Age of Onset   Arthritis Mother    Heart disease Mother    Arthritis Father    CVA Other    Diabetes Other    Heart attack Other    Heart disease Maternal Grandmother    CVA Maternal Grandmother     Review of Systems: Constitutional: Doesn't report fevers, chills or abnormal weight loss Eyes: Doesn't report blurriness of vision Ears, nose, mouth, throat, and face: Doesn't report sore throat Respiratory: Doesn't report cough, dyspnea or wheezes Cardiovascular: Doesn't report palpitation, chest discomfort  Gastrointestinal:  Doesn't report nausea, constipation, diarrhea GU: Doesn't report incontinence Skin: Doesn't report skin rashes Neurological: Per HPI Musculoskeletal: Doesn't report joint pain Behavioral/Psych: Doesn't report anxiety  Physical Exam: Vitals:   03/04/24 1042  BP: 91/66  Pulse: 62  Resp: 17  Temp: 97.8 F (36.6 C)  SpO2: 100%    KPS: 90. General: Alert, cooperative, pleasant, in no acute distress Head: Normal EENT: No conjunctival injection or scleral icterus.  Lungs: Resp effort normal Cardiac: Regular rate Abdomen: Non-distended abdomen Skin: No rashes cyanosis or petechiae. Extremities: No clubbing or edema  Neurologic Exam: Mental Status: Awake, alert, attentive to examiner. Oriented to self and environment. Language is fluent with intact  comprehension.  Cranial Nerves: Visual acuity is grossly normal. Visual fields are full. Extra-ocular movements intact. No ptosis. Face is symmetric Motor: Tone and bulk are normal. Power is full in both arms and legs. Reflexes are symmetric, no pathologic reflexes present.  Sensory: Stocking sensory changes Gait: Normal.   Labs: I have reviewed the data as listed    Component Value Date/Time   NA 140 10/26/2023 1302   NA 137 06/28/2022 1258   NA 141 04/20/2017 1028   K 4.3 10/26/2023 1302   K 4.3 04/20/2017 1028   CL 104 10/26/2023 1302   CO2 30 10/26/2023 1302   CO2 26 04/20/2017 1028   GLUCOSE 80 10/26/2023 1302   GLUCOSE 96 04/20/2017 1028   BUN 13 10/26/2023 1302   BUN 10 06/28/2022 1258   BUN 16.7 04/20/2017 1028   CREATININE 1.51 (H) 10/26/2023 1302   CREATININE 1.65 (H) 01/06/2022 1408   CREATININE 2.0 (H) 04/20/2017 1028   CALCIUM 9.5 10/26/2023 1302   CALCIUM 9.0 04/20/2017 1028   PROT 7.4 10/26/2023 1302   PROT 6.4 01/09/2023 1341   PROT 7.0 04/20/2017 1028   ALBUMIN  3.9 10/26/2023 1302   ALBUMIN  4.0 01/09/2023 1341   ALBUMIN  4.0 04/20/2017 1028   AST 14 (L) 10/26/2023 1302   AST 35 01/06/2022 1408   AST 20 04/20/2017 1028   ALT 7 10/26/2023 1302   ALT 32 01/06/2022 1408   ALT 17 04/20/2017 1028   ALKPHOS 87 10/26/2023 1302   ALKPHOS 61 04/20/2017 1028   BILITOT 0.4 10/26/2023 1302   BILITOT 0.5 01/09/2023 1341   BILITOT 0.6 01/06/2022 1408   BILITOT  0.49 04/20/2017 1028   GFRNONAA 54 (L) 10/26/2023 1302   GFRNONAA 49 (L) 01/06/2022 1408   GFRAA 58 (L) 01/01/2020 1127   Lab Results  Component Value Date   WBC 11.3 (H) 10/26/2023   NEUTROABS 9.2 (H) 10/26/2023   HGB 14.2 10/26/2023   HCT 44.9 10/26/2023   MCV 88.9 10/26/2023   PLT 276 10/26/2023     Assessment/Plan Chemotherapy-induced neuropathy  Mason [name] presents with clinical syndrome consistent with symmetric, length dependent, small and large fiber peripheral neuropathy.   Etiology is exposure to platinum based chemotherapy.  Gabapentin  will be discontinued due to poor efficacy and tolerability.    Discussed and recommended trial of Lyrica 50mg  BID, he is agreeable with this.  We ask that Mason [name] return to clinic in 2 months for medication adjustemnt, or sooner as needed.  All questions were answered. The patient knows to call the clinic with any problems, questions or concerns. No barriers to learning were detected.  The total time spent in the encounter was 30 minutes and more than 50% was on counseling and review of test results   Arthea MARLA Manns, MD Medical Director of Neuro-Oncology [facility] 03/04/24 10:39 AM

## 2024-03-05 ENCOUNTER — Other Ambulatory Visit (HOSPITAL_BASED_OUTPATIENT_CLINIC_OR_DEPARTMENT_OTHER): Payer: Self-pay

## 2024-03-05 ENCOUNTER — Telehealth: Payer: Self-pay | Admitting: *Deleted

## 2024-03-05 NOTE — Telephone Encounter (Signed)
 Dr. San,   This pt is scheduled with you on 11/25. He has a h/o difficult intubation and his procedure will need to be done at the hospital.   Best regards,  Norleen EMERSON Schillings

## 2024-03-05 NOTE — Telephone Encounter (Addendum)
 Patient scheudled to see Dr C due to issues he is having and requested an appointment before the procedure so 12-1 for ov and 05-07-24 for hospital EGD instructions in chart case number [id number]

## 2024-03-05 NOTE — Addendum Note (Signed)
 Addended by: KATHIE BOTTCHER E on: 03/05/2024 04:27 PM   Modules accepted: Orders

## 2024-03-11 NOTE — Telephone Encounter (Signed)
 OV scheduled

## 2024-03-12 ENCOUNTER — Encounter: Admitting: Gastroenterology

## 2024-03-13 ENCOUNTER — Ambulatory Visit (HOSPITAL_COMMUNITY)

## 2024-03-13 ENCOUNTER — Ambulatory Visit (HOSPITAL_COMMUNITY)
Admission: RE | Admit: 2024-03-13 | Discharge: 2024-03-13 | Disposition: A | Discharge status: Home / Self Care | Source: Ambulatory Visit | Attending: Family | Admitting: Family

## 2024-03-13 DIAGNOSIS — R1310 Dysphagia, unspecified: Secondary | ICD-10-CM

## 2024-03-17 ENCOUNTER — Other Ambulatory Visit: Payer: Self-pay | Admitting: Internal Medicine

## 2024-03-17 ENCOUNTER — Other Ambulatory Visit: Payer: Self-pay | Admitting: Nurse Practitioner

## 2024-03-17 ENCOUNTER — Other Ambulatory Visit: Payer: Self-pay | Admitting: Hematology and Oncology

## 2024-03-18 ENCOUNTER — Other Ambulatory Visit: Payer: Self-pay

## 2024-03-18 ENCOUNTER — Encounter: Payer: Self-pay | Admitting: Gastroenterology

## 2024-03-18 ENCOUNTER — Ambulatory Visit: Admitting: Gastroenterology

## 2024-03-18 ENCOUNTER — Other Ambulatory Visit (HOSPITAL_COMMUNITY): Payer: Self-pay

## 2024-03-18 VITALS — BP 114/62 | HR 62 | Ht 76.5 in | Wt 213.0 lb

## 2024-03-18 DIAGNOSIS — R634 Abnormal weight loss: Secondary | ICD-10-CM | POA: Diagnosis not present

## 2024-03-18 DIAGNOSIS — C099 Malignant neoplasm of tonsil, unspecified: Secondary | ICD-10-CM | POA: Diagnosis not present

## 2024-03-18 DIAGNOSIS — R1319 Other dysphagia: Secondary | ICD-10-CM | POA: Diagnosis not present

## 2024-03-18 MED ORDER — LEVOTHYROXINE SODIUM 100 MCG PO TABS
100.0000 ug | ORAL_TABLET | Freq: Every day | ORAL | 5 refills | Status: AC
  Filled 2024-03-18 – 2024-03-25 (×2): qty 30, 30d supply, fill #0
  Filled 2024-04-08 – 2024-04-25 (×2): qty 30, 30d supply, fill #1
  Filled 2024-05-22: qty 30, 30d supply, fill #2

## 2024-03-18 MED ORDER — OXYCODONE HCL 15 MG PO TABS
15.0000 mg | ORAL_TABLET | Freq: Four times a day (QID) | ORAL | 0 refills | Status: DC | PRN
  Filled 2024-03-18: qty 90, 23d supply, fill #0

## 2024-03-18 MED ORDER — SPIRONOLACTONE 25 MG PO TABS
12.5000 mg | ORAL_TABLET | Freq: Every day | ORAL | 2 refills | Status: AC
  Filled 2024-03-18: qty 45, 90d supply, fill #0

## 2024-03-18 MED ORDER — SACUBITRIL-VALSARTAN 49-51 MG PO TABS
1.0000 | ORAL_TABLET | Freq: Two times a day (BID) | ORAL | 5 refills | Status: AC
  Filled 2024-03-18 – 2024-03-25 (×2): qty 60, 30d supply, fill #0
  Filled 2024-04-25: qty 60, 30d supply, fill #1
  Filled 2024-05-22: qty 60, 30d supply, fill #2

## 2024-03-18 NOTE — Progress Notes (Signed)
 Chief Complaint:    Dysphagia  GI History: Mason [name] is a 58 y.o. 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  History of progressive dysphagia over 1 year.  Worse with bread and rice. He describes trouble transiting food from mouth into the esophagus with forceful swallows.  Also with thick secretions/saliva which are difficult to handle.  Has tried hard candies with nominal improvement in viscosity.     Reports a history of dysphagia and esophageal stricture requiring EGD with dilation around 2019 with improvement.  He states these of the same symptoms and improved with esophageal dilation then.  He cannot recall where this was done. - 03/12/2018: Esophagram: Trace, silent aspiration, retrograde reflux of contrast from oropharynx into posterior nasopharynx with swallowing.  Barium tablet passed readily into the stomach - 02/23/2024: Initial appointment in the GI clinic for evaluation of progressive solid food dysphagia.  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  HPI:     Patient is a 58 y.o. male presenting to the [facility]astroenterology Clinic for evaluation of dysphagia.  Was just seen by me on 02/23/2024 for initial evaluation of these same symptoms.    Reports he has been losing weight and was 206# on home scale. Weight at initial appointment last month in this office was 214#.  Weight today is 213#.  However, comparison weight from 11/14/2023 was 230#.  Due to history of difficult intubation, EGD is scheduled to be done at the hospital endoscopy unit on 05/07/2024.  He presents to  the office today mostly to talk about what that entails.  Presents to the office with his spouse.    Review of systems:     No chest pain, no SOB, no fevers, no urinary sx   Past Medical History:  Diagnosis Date   Abnormal liver function test    Acute sinusitis, unspecified 05/18/2015   Anemia    Anxiety    mild new dx   Arthritis    knees,hips   Bilateral edema of lower extremity    Complication of anesthesia    Pt stated  my oxygen  level was slow in rising.   Concussion    Hx: in high school x 2   Constipation    Coronary artery disease    Depression    Dysuria 09/04/2015   Fever    GERD (gastroesophageal reflux disease)    Hyperactive gag reflex    Hypertension    Hypoglycemia    Hypokalemia    Hypothyroidism    Insomnia 06/15/2015   Knee pain, chronic    Malnutrition    Non-healing surgical wound 05/23/2014   PEG (percutaneous endoscopic gastrostomy) status (HCC)    Renal failure, acute    CKD3   S/P radiation therapy 08/19/2013-10/15/2013   Right Tonstil and bilateral neck / 70 Gy in 35 fractions to gross disease, 63 Gy in 35 fractions to high risk nodal echelons, and 56 Gy in 35 fractions to intermediate risk nodal echelons   Severe nausea and vomiting    Status post chemotherapy    Only received 2 doses due to uncontrolled nausea and acute renal failure   Tonsillar cancer (HCC) 07/09/2013   SCCA of Right  Tonsil, recurrent 2016   Vitamin B12 deficiency 01/07/2022    Patient's surgical history, family medical history, social history, medications and allergies were all reviewed in Epic    Current Outpatient Medications  Medication Sig Dispense Refill   Apremilast (OTEZLA) 30 MG TABS Take 30 mg by mouth in the morning and at bedtime.     aspirin  EC 81 MG tablet Take 1 tablet (81 mg total) by mouth 2 (two) times daily. Swallow whole. 60 tablet 0   cholecalciferol (VITAMIN D3) 25 MCG (1000 UNIT) tablet Take 1,000 Units by mouth daily.     clobetasol cream  (TEMOVATE) 0.05 % Apply topically 2 (two) times daily as needed.     CREON  36000-114000 units CPEP capsule Take 72,000 Units by mouth 3 (three) times daily.     dapagliflozin  propanediol (FARXIGA ) 10 MG TABS tablet Take 1 tablet (10 mg total) by mouth daily before breakfast. 90 tablet 2   isosorbide  mononitrate (IMDUR ) 30 MG 24 hr tablet Take 1 tablet (30 mg total) by mouth daily. 90 tablet 3   levothyroxine  (SYNTHROID ) 100 MCG tablet Take 1 tablet (100 mcg total) by mouth daily before breakfast. 30 tablet 5   lidocaine -prilocaine  (EMLA ) cream Apply topically.     lipase/protease/amylase (CREON ) 36000 UNITS CPEP capsule TAKE 2 CAPSULES BY MOUTH WITH EACH MEAL AND 1 CAPSULE WITH SNACKS 210 capsule 11   metoprolol  succinate (TOPROL  XL) 100 MG 24 hr tablet Take 1 tablet (100 mg total) by mouth daily. 30 tablet 5   nitroGLYCERIN  (NITROSTAT ) 0.4 MG SL tablet Place 1 tablet (0.4 mg total) under the tongue every 5 (five) minutes as needed. 25 tablet 3   omeprazole  (PRILOSEC) 20 MG capsule Take 1 capsule (20 mg total) by mouth daily. 30 capsule 2   oxyCODONE  (ROXICODONE ) 15 MG immediate release tablet Take 1 tablet by mouth every 6 hours as needed. 90 tablet 0   pregabalin  (LYRICA ) 50 MG capsule Take 1 capsule (50 mg total) by mouth 2 (two) times daily. 60 capsule 2   sacubitril -valsartan  (ENTRESTO ) 49-51 MG Take 1 tablet by mouth 2 (two) times daily. 60 tablet 8   simvastatin  (ZOCOR ) 20 MG tablet Take 1 tablet (20 mg total) by mouth at bedtime. 90 tablet 2   Skin Protectants, Misc. (MINERIN CREME) CREA Apply 1 Application topically daily as needed (rash).     spironolactone  (ALDACTONE ) 25 MG tablet Take 0.5 tablets (12.5 mg total) by mouth daily. 45 tablet 2   triamcinolone cream (KENALOG) 0.1 % Apply 1 Application topically daily as needed (eczema).     No current facility-administered medications for this visit.    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  Seems to have at least some component of oropharyngeal dysphagia based on clinical description, but he reports having had an EGD with dilation in 2019 for similar symptoms with good response.  Symptoms are also complicated by  thick secretions presumably 2/2 prior radiation affecting salivary glands.    Prior notes from outside facilities reviewed and was seen by ENT in 02/2018 with exam notable for significant fibrosis of the right neck from prior surgery and radiation.  - Schedule EGD with esophageal dilation and/or biopsies.  Due to history of difficult airway, procedure to be scheduled at the hospital Endoscopy Unit - Previously placed referral for MBS.  He will call to schedule - Depending on results, may need to consider referral back to ENT - Will place on cancellation list for EGD at the hospital.  Per request, his spouse has requested that we call her for any change in scheduling.  Her best contact number is [phone]  -We again discussed the risks, benefits, alternatives of EGD today  I spent 30 minutes of time, including in depth chart review, independent review of results as outlined above, communicating results with the patient directly, face-to-face time with the patient, coordinating care, and ordering studies and medications as appropriate, and documentation.       Sandor LULLA Flatter ,DO, FACG 03/18/2024, 3:05 PM

## 2024-03-18 NOTE — Patient Instructions (Signed)
 _______________________________________________________  If your blood pressure at your visit was 140/90 or greater, please contact your primary care physician to follow up on this.  _______________________________________________________  If you are age 56 or older, your body mass index should be between 23-30. Your Body mass index is 25.59 kg/m. If this is out of the aforementioned range listed, please consider follow up with your Primary Care Provider.  If you are age 26 or younger, your body mass index should be between 19-25. Your Body mass index is 25.59 kg/m. If this is out of the aformentioned range listed, please consider follow up with your Primary Care Provider.   ________________________________________________________  The [facility] providers would like to encourage you to use MYCHART to communicate with providers for non-urgent requests or questions.  Due to long hold times on the telephone, sending your provider a message by [facility] may be a faster and more efficient way to get a response.  Please allow 48 business hours for a response.  Please remember that this is for non-urgent requests.  _______________________________________________________  [facility] is using a team-based approach to care.  Your team is made up of your doctor and two to three APPS. Our APPS (Nurse Practitioners and Physician Assistants) work with your physician to ensure care continuity for you. They are fully qualified to address your health concerns and develop a treatment plan. They communicate directly with your gastroenterologist to care for you. Seeing the Advanced Practice Practitioners on your physician's team can help you by facilitating care more promptly, often allowing for earlier appointments, access to diagnostic testing, procedures, and other specialty referrals.   It was a pleasure to see you today!  Vito Cirigliano, D.O.

## 2024-03-18 NOTE — H&P (View-Only) (Signed)
 Chief Complaint:    Dysphagia  GI History: Mason [name] is a 58 y.o. 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  History of progressive dysphagia over 1 year.  Worse with bread and rice. He describes trouble transiting food from mouth into the esophagus with forceful swallows.  Also with thick secretions/saliva which are difficult to handle.  Has tried hard candies with nominal improvement in viscosity.     Reports a history of dysphagia and esophageal stricture requiring EGD with dilation around 2019 with improvement.  He states these of the same symptoms and improved with esophageal dilation then.  He cannot recall where this was done. - 03/12/2018: Esophagram: Trace, silent aspiration, retrograde reflux of contrast from oropharynx into posterior nasopharynx with swallowing.  Barium tablet passed readily into the stomach - 02/23/2024: Initial appointment in the GI clinic for evaluation of progressive solid food dysphagia.  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  HPI:     Patient is a 59 y.o. male presenting to the Gastroenterology Clinic for evaluation of dysphagia.  Was just seen by me on 02/23/2024 for initial evaluation of these same symptoms.    Reports he has been losing weight and was 206# on home scale. Weight at initial appointment last month in this office was 214#.  Weight today is 213#.  However, comparison weight from 11/14/2023 was 230#.  Due to history of difficult intubation, EGD is scheduled to be done at the hospital endoscopy unit on 05/07/2024.  He presents to  the office today mostly to talk about what that entails.  Presents to the office with his spouse.    Review of systems:     No chest pain, no SOB, no fevers, no urinary sx   Past Medical History:  Diagnosis Date   Abnormal liver function test    Acute sinusitis, unspecified 05/18/2015   Anemia    Anxiety    mild new dx   Arthritis    knees,hips   Bilateral edema of lower extremity    Complication of anesthesia    Pt stated  my oxygen  level was slow in rising.   Concussion    Hx: in high school x 2   Constipation    Coronary artery disease    Depression    Dysuria 09/04/2015   Fever    GERD (gastroesophageal reflux disease)    Hyperactive gag reflex    Hypertension    Hypoglycemia    Hypokalemia    Hypothyroidism    Insomnia 06/15/2015   Knee pain, chronic    Malnutrition    Non-healing surgical wound 05/23/2014   PEG (percutaneous endoscopic gastrostomy) status (HCC)    Renal failure, acute    CKD3   S/P radiation therapy 08/19/2013-10/15/2013   Right Tonstil and bilateral neck / 70 Gy in 35 fractions to gross disease, 63 Gy in 35 fractions to high risk nodal echelons, and 56 Gy in 35 fractions to intermediate risk nodal echelons   Severe nausea and vomiting    Status post chemotherapy    Only received 2 doses due to uncontrolled nausea and acute renal failure   Tonsillar cancer (HCC) 07/09/2013   SCCA of Right  Tonsil, recurrent 2016   Vitamin B12 deficiency 01/07/2022    Patient's surgical history, family medical history, social history, medications and allergies were all reviewed in Epic    Current Outpatient Medications  Medication Sig Dispense Refill   Apremilast (OTEZLA) 30 MG TABS Take 30 mg by mouth in the morning and at bedtime.     aspirin  EC 81 MG tablet Take 1 tablet (81 mg total) by mouth 2 (two) times daily. Swallow whole. 60 tablet 0   cholecalciferol (VITAMIN D3) 25 MCG (1000 UNIT) tablet Take 1,000 Units by mouth daily.     clobetasol cream  (TEMOVATE) 0.05 % Apply topically 2 (two) times daily as needed.     CREON  36000-114000 units CPEP capsule Take 72,000 Units by mouth 3 (three) times daily.     dapagliflozin  propanediol (FARXIGA ) 10 MG TABS tablet Take 1 tablet (10 mg total) by mouth daily before breakfast. 90 tablet 2   isosorbide  mononitrate (IMDUR ) 30 MG 24 hr tablet Take 1 tablet (30 mg total) by mouth daily. 90 tablet 3   levothyroxine  (SYNTHROID ) 100 MCG tablet Take 1 tablet (100 mcg total) by mouth daily before breakfast. 30 tablet 5   lidocaine -prilocaine  (EMLA ) cream Apply topically.     lipase/protease/amylase (CREON ) 36000 UNITS CPEP capsule TAKE 2 CAPSULES BY MOUTH WITH EACH MEAL AND 1 CAPSULE WITH SNACKS 210 capsule 11   metoprolol  succinate (TOPROL  XL) 100 MG 24 hr tablet Take 1 tablet (100 mg total) by mouth daily. 30 tablet 5   nitroGLYCERIN  (NITROSTAT ) 0.4 MG SL tablet Place 1 tablet (0.4 mg total) under the tongue every 5 (five) minutes as needed. 25 tablet 3   omeprazole  (PRILOSEC) 20 MG capsule Take 1 capsule (20 mg total) by mouth daily. 30 capsule 2   oxyCODONE  (ROXICODONE ) 15 MG immediate release tablet Take 1 tablet by mouth every 6 hours as needed. 90 tablet 0   pregabalin  (LYRICA ) 50 MG capsule Take 1 capsule (50 mg total) by mouth 2 (two) times daily. 60 capsule 2   sacubitril -valsartan  (ENTRESTO ) 49-51 MG Take 1 tablet by mouth 2 (two) times daily. 60 tablet 8   simvastatin  (ZOCOR ) 20 MG tablet Take 1 tablet (20 mg total) by mouth at bedtime. 90 tablet 2   Skin Protectants, Misc. (MINERIN CREME) CREA Apply 1 Application topically daily as needed (rash).     spironolactone  (ALDACTONE ) 25 MG tablet Take 0.5 tablets (12.5 mg total) by mouth daily. 45 tablet 2   triamcinolone cream (KENALOG) 0.1 % Apply 1 Application topically daily as needed (eczema).     No current facility-administered medications for this visit.    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  Seems to have at least some component of oropharyngeal dysphagia based on clinical description, but he reports having had an EGD with dilation in 2019 for similar symptoms with good response.  Symptoms are also complicated by  thick secretions presumably 2/2 prior radiation affecting salivary glands.    Prior notes from outside facilities reviewed and was seen by ENT in 02/2018 with exam notable for significant fibrosis of the right neck from prior surgery and radiation.  - Schedule EGD with esophageal dilation and/or biopsies.  Due to history of difficult airway, procedure to be scheduled at the hospital Endoscopy Unit - Previously placed referral for MBS.  He will call to schedule - Depending on results, may need to consider referral back to ENT - Will place on cancellation list for EGD at the hospital.  Per request, his spouse has requested that we call her for any change in scheduling.  Her best contact number is [phone]  -We again discussed the risks, benefits, alternatives of EGD today  I spent 30 minutes of time, including in depth chart review, independent review of results as outlined above, communicating results with the patient directly, face-to-face time with the patient, coordinating care, and ordering studies and medications as appropriate, and documentation.       Sandor LULLA Flatter ,DO, FACG 03/18/2024, 3:05 PM

## 2024-03-19 ENCOUNTER — Other Ambulatory Visit (HOSPITAL_COMMUNITY): Payer: Self-pay

## 2024-03-22 ENCOUNTER — Telehealth: Payer: Self-pay

## 2024-03-22 NOTE — Telephone Encounter (Signed)
 Patient aware that EGD has been rescheduled from 05-07-24 to 03-26-24 at [facility].  Patient advised he should arrive to [facility] on 03-26-24 at 7:15am.  New instructions sent to patient via MyChart.  Patient agreed to plan and verbalized understanding. No further questions or concerns.

## 2024-03-24 ENCOUNTER — Encounter: Payer: Self-pay | Admitting: Gastroenterology

## 2024-03-25 ENCOUNTER — Encounter (HOSPITAL_COMMUNITY): Payer: Self-pay | Admitting: Gastroenterology

## 2024-03-25 ENCOUNTER — Other Ambulatory Visit (HOSPITAL_COMMUNITY): Payer: Self-pay

## 2024-03-26 ENCOUNTER — Other Ambulatory Visit (HOSPITAL_COMMUNITY): Payer: Self-pay

## 2024-03-26 ENCOUNTER — Ambulatory Visit (HOSPITAL_COMMUNITY): Payer: Self-pay | Admitting: Anesthesiology

## 2024-03-26 ENCOUNTER — Ambulatory Visit (HOSPITAL_COMMUNITY)
Admission: RE | Admit: 2024-03-26 | Discharge: 2024-03-26 | Disposition: A | Attending: Gastroenterology | Admitting: Gastroenterology

## 2024-03-26 ENCOUNTER — Encounter (HOSPITAL_COMMUNITY): Payer: Self-pay | Admitting: Gastroenterology

## 2024-03-26 ENCOUNTER — Encounter (HOSPITAL_COMMUNITY): Admission: RE | Disposition: A | Payer: Self-pay | Source: Home / Self Care | Attending: Gastroenterology

## 2024-03-26 ENCOUNTER — Other Ambulatory Visit: Payer: Self-pay

## 2024-03-26 DIAGNOSIS — K2970 Gastritis, unspecified, without bleeding: Secondary | ICD-10-CM

## 2024-03-26 DIAGNOSIS — R1319 Other dysphagia: Secondary | ICD-10-CM

## 2024-03-26 DIAGNOSIS — B3781 Candidal esophagitis: Secondary | ICD-10-CM

## 2024-03-26 HISTORY — PX: ESOPHAGOGASTRODUODENOSCOPY: SHX5428

## 2024-03-26 HISTORY — PX: SAVORY DILATION: SHX5439

## 2024-03-26 SURGERY — EGD, WITH DILATION USING SAVARY-GILLIARD DILATOR OVER GUIDEWIRE
Anesthesia: Monitor Anesthesia Care

## 2024-03-26 MED ORDER — SUCCINYLCHOLINE CHLORIDE 200 MG/10ML IV SOSY
PREFILLED_SYRINGE | INTRAVENOUS | Status: DC | PRN
  Administered 2024-03-26: 50 mg via INTRAVENOUS

## 2024-03-26 MED ORDER — PROPOFOL 10 MG/ML IV BOLUS
INTRAVENOUS | Status: DC | PRN
  Administered 2024-03-26: 200 mg via INTRAVENOUS

## 2024-03-26 MED ORDER — FENTANYL CITRATE (PF) 100 MCG/2ML IJ SOLN
INTRAMUSCULAR | Status: AC
  Filled 2024-03-26: qty 2

## 2024-03-26 MED ORDER — ONDANSETRON HCL 4 MG/2ML IJ SOLN
4.0000 mg | Freq: Once | INTRAMUSCULAR | Status: AC
  Administered 2024-03-26: 4 mg via INTRAVENOUS

## 2024-03-26 MED ORDER — PANTOPRAZOLE SODIUM 40 MG PO TBEC
40.0000 mg | DELAYED_RELEASE_TABLET | Freq: Two times a day (BID) | ORAL | 3 refills | Status: DC
  Filled 2024-03-26: qty 60, 30d supply, fill #0

## 2024-03-26 MED ORDER — FENTANYL CITRATE (PF) 100 MCG/2ML IJ SOLN
INTRAMUSCULAR | Status: DC | PRN
  Administered 2024-03-26 (×2): 50 ug via INTRAVENOUS

## 2024-03-26 MED ORDER — SODIUM CHLORIDE 0.9 % IV SOLN: INTRAVENOUS | Status: DC

## 2024-03-26 MED ORDER — EPHEDRINE SULFATE-NACL 50-0.9 MG/10ML-% IV SOSY
PREFILLED_SYRINGE | INTRAVENOUS | Status: DC | PRN
  Administered 2024-03-26 (×2): 5 mg via INTRAVENOUS

## 2024-03-26 MED ORDER — LIDOCAINE HCL (PF) 2 % IJ SOLN
INTRAMUSCULAR | Status: DC | PRN
  Administered 2024-03-26: 80 mg via INTRADERMAL

## 2024-03-26 MED ORDER — ONDANSETRON HCL 4 MG/2ML IJ SOLN
INTRAMUSCULAR | Status: AC
  Filled 2024-03-26: qty 2

## 2024-03-26 MED ORDER — FLUCONAZOLE 100 MG PO TABS
100.0000 mg | ORAL_TABLET | Freq: Every day | ORAL | 0 refills | Status: DC
  Filled 2024-03-26: qty 22, 22d supply, fill #0

## 2024-03-26 NOTE — Anesthesia Procedure Notes (Signed)
 Procedure Name: Intubation Date/Time: 03/26/2024 8:18 AM  Performed by: Nassir Neidert D, CRNAPre-anesthesia Checklist: Patient identified, Emergency Drugs available, Suction available and Patient being monitored Patient Re-evaluated:Patient Re-evaluated prior to induction Oxygen  Delivery Method: Circle system utilized Preoxygenation: Pre-oxygenation with 100% oxygen  Induction Type: IV induction Ventilation: Mask ventilation without difficulty Laryngoscope Size: Glidescope and 4 Tube type: Oral Number of attempts: 1 Airway Equipment and Method: Stylet, Oral airway and Video-laryngoscopy Placement Confirmation: ETT inserted through vocal cords under direct vision, positive ETCO2 and breath sounds checked- equal and bilateral Tube secured with: Tape Dental Injury: Teeth and Oropharynx as per pre-operative assessment

## 2024-03-26 NOTE — Op Note (Signed)
 [facility] Patient Name: Mason [name] Procedure Date: 03/26/2024 MRN: [id number] Attending MD: Sandor Flatter , MD, [id number] Date of Birth: Jun 02, 1965 CSN: [id number] Age: 58 Admit Type: Outpatient Procedure:                Upper GI endoscopy Indications:              Dysphagia Providers:                Sandor Flatter, MD, Hoy Penner, RN, Farris Southgate, Technician Referring MD:              Medicines:                Monitored Anesthesia Care with elective intubation Complications:            No immediate complications. Estimated Blood Loss:     Estimated blood loss was minimal. Procedure:                Pre-Anesthesia Assessment:                           - Prior to the procedure, a History and Physical                            was performed, and patient medications and                            allergies were reviewed. The patient's tolerance of                            previous anesthesia was also reviewed. The risks                            and benefits of the procedure and the sedation                            options and risks were discussed with the patient.                            All questions were answered, and informed consent                            was obtained. Prior Anticoagulants: The patient has                            taken no anticoagulant or antiplatelet agents. ASA                            Grade Assessment: III - A patient with severe                            systemic disease. After reviewing the risks and  benefits, the patient was deemed in satisfactory                            condition to undergo the procedure.                           After obtaining informed consent, the endoscope was                            passed under direct vision. Throughout the                            procedure, the patient's blood pressure, pulse, and                             oxygen  saturations were monitored continuously. The                            [id number]-H190 ([id number]) Olympus endoscope was introduced                            through the mouth, and advanced to the second part                            of duodenum. The upper GI endoscopy was                            accomplished without difficulty. The patient                            tolerated the procedure well. Scope In: Scope Out: Findings:      LA Grade B (one or more mucosal breaks greater than 5 mm, not extending       between the tops of two mucosal folds) esophagitis with no bleeding was       found in the lower third of the esophagus. Biopsies were taken with a       cold forceps for histology. Estimated blood loss was minimal.      Localized, white plaques were found in the upper third of the esophagus.       Biopsies were taken with a cold forceps for histology. Estimated blood       loss was minimal.      The esophageal lumen was otherwise without stricture or narrowing. Given       the symptoms of esophageal dysphagia, the decision was made to perform       esophageal dilation. A guidewire was placed and the scope was withdrawn.       Dilation was performed with a Savary dilator with mild resistance at 15       mm. The dilation site was examined following endoscope reinsertion and       showed mild mucosal disruption in the lower esophagus. Estimated blood       loss was minimal.      A medium amount of food (residue) was found in the gastric fundus, in       the gastric body and in the gastric antrum which limited  visualization.      Mild inflammation characterized by congestion (edema) was found in the       gastric body and in the gastric antrum. Biopsies were taken with a cold       forceps for histology. Estimated blood loss was minimal.      The examined duodenum was normal. Impression:               - LA Grade B esophagitis with no bleeding. Biopsied.                            - Esophageal plaques were found, suspicious for                            candidiasis. Biopsied.                           - The esophagus was dilated with a 15 mm Savary                            dilator with mild resistance. There was a small                            mucosal disruption in the distal esophagus. This is                            more likely due to the mucosal friability and                            esophagitis rather than indicative of a lower                            esophageal stricture. No other mucosal disruptions                            noted.                           - A medium amount of food (residue) in the stomach.                           - Gastritis. Biopsied.                           - Normal examined duodenum.                           - Oral thrush noted. Moderate Sedation:      Not Applicable - Patient had care per Anesthesia. Recommendation:           - Patient has a contact number available for                            emergencies. The signs and symptoms of potential  delayed complications were discussed with the                            patient. Return to normal activities tomorrow.                            Written discharge instructions were provided to the                            patient.                           - Soft diet today.                           - Continue present medications.                           - Await pathology results.                           - Use Protonix  (pantoprazole ) 40 mg PO BID for 6                            weeks. If dysphagia and upper GI symptoms resolved,                            can reduce to 40 mg daily for continued control of                            reflux.                           - Diflucan  (fluconazole ) 200 mg x1 then 100 mg PO                            daily for 3 weeks.                           - Do a gastric emptying study at appointment to be                             scheduled.                           - Return to GI clinic in 3 months.                           - Given the findings on this endoscopy and retained                            food in the stomach, curious about superimposed                            motility disorder affecting dysphagia. May need to  consider esophageal manometry +/- pH/impedance                            testing depending on response to intervention today                            and therapy prescribed. Procedure Code(s):        --- Professional ---                           [phone], Esophagogastroduodenoscopy, flexible,                            transoral; with insertion of guide wire followed by                            passage of dilator(s) through esophagus over guide                            wire                           43239, 59, Esophagogastroduodenoscopy, flexible,                            transoral; with biopsy, single or multiple Diagnosis Code(s):        --- Professional ---                           K20.90, Esophagitis, unspecified without bleeding                           K22.9, Disease of esophagus, unspecified                           K29.70, Gastritis, unspecified, without bleeding                           R13.10, Dysphagia, unspecified CPT copyright 2022 American Medical Association. All rights reserved. The codes documented in this report are preliminary and upon coder review may  be revised to meet current compliance requirements. Sandor Flatter, MD 03/26/2024 9:07:00 AM Number of Addenda: 0

## 2024-03-26 NOTE — Transfer of Care (Signed)
 Immediate Anesthesia Transfer of Care Note  Patient: Mason [name]  Procedure(s) Performed: EGD (ESOPHAGOGASTRODUODENOSCOPY) EGD, WITH DILATION USING SAVARY-GILLIARD DILATOR OVER GUIDEWIRE  Patient Location: PACU  Anesthesia Type:General  Level of Consciousness: awake, alert , and oriented  Airway & Oxygen  Therapy: Patient Spontanous Breathing and Patient connected to face mask oxygen   Post-op Assessment: Report given to RN and Post -op Vital signs reviewed and stable  Post vital signs: Reviewed and stable  Last Vitals:  Vitals Value Taken Time  BP    Temp    Pulse 59 03/26/24 08:58  Resp 15 03/26/24 08:58  SpO2 100 % 03/26/24 08:58  Vitals shown include unfiled device data.  Last Pain:  Vitals:   03/26/24 0800  TempSrc: Temporal  PainSc: 0-No pain         Complications: No notable events documented.

## 2024-03-26 NOTE — Interval H&P Note (Signed)
 History and Physical Interval Note:  03/26/2024 7:54 AM  Mason [name]  has presented today for surgery, with the diagnosis of dysphagia.  The various methods of treatment have been discussed with the patient and family. After consideration of risks, benefits and other options for treatment, the patient has consented to  Procedure(s): EGD (ESOPHAGOGASTRODUODENOSCOPY) (N/A) EGD, WITH DILATION USING SAVARY-GILLIARD DILATOR OVER GUIDEWIRE (N/A) as a surgical intervention.  The patient's history has been reviewed, patient examined, no change in status, stable for surgery.  I have reviewed the patient's chart and labs.  Questions were answered to the patient's satisfaction.     Sandor GAILS Jermar Colter

## 2024-03-26 NOTE — Anesthesia Postprocedure Evaluation (Signed)
 Anesthesia Post Note  Patient: Mason [name]  Procedure(s) Performed: EGD (ESOPHAGOGASTRODUODENOSCOPY) EGD, WITH DILATION USING SAVARY-GILLIARD DILATOR OVER GUIDEWIRE     Patient location during evaluation: Endoscopy Anesthesia Type: General Level of consciousness: awake Pain management: pain level controlled Vital Signs Assessment: post-procedure vital signs reviewed and stable Respiratory status: spontaneous breathing Cardiovascular status: stable Postop Assessment: no apparent nausea or vomiting Anesthetic complications: no   No notable events documented.  Last Vitals:  Vitals:   03/26/24 0925 03/26/24 0930  BP:    Pulse: (!) 59 67  Resp: 15 17  Temp:    SpO2: 100% 99%    Last Pain:  Vitals:   03/26/24 0925  TempSrc:   PainSc: 0-No pain                 Shefali Ng T Colhoun

## 2024-03-26 NOTE — Discharge Instructions (Signed)

## 2024-03-26 NOTE — Anesthesia Preprocedure Evaluation (Addendum)
 Anesthesia Evaluation  Patient identified by MRN, date of birth, ID band Patient awake    Reviewed: Allergy & Precautions, NPO status , Patient's Chart, lab work & pertinent test results, reviewed documented beta blocker date and time   History of Anesthesia Complications Negative for: history of anesthetic complications  Airway Mallampati: I  TM Distance: <3 FB Neck ROM: Limited   Comment: S/p Neck Dissection with XRT Dental  (+) Dental Advisory Given, Edentulous Upper, Edentulous Lower   Pulmonary COPD, former smoker   breath sounds clear to auscultation       Cardiovascular hypertension, Pt. on medications and Pt. on home beta blockers + CAD (per CT) and +CHF   Rhythm:Regular Rate:Normal  TTE (2024):  1. Left ventricular ejection fraction, by estimation, is 55 to 60%. The  left ventricle has normal function. The left ventricle has no regional  wall motion abnormalities. There is mild concentric left ventricular  hypertrophy. Left ventricular diastolic parameters are consistent with Grade I diastolic dysfunction (impaired relaxation).   2. Right ventricular systolic function is normal. The right ventricular  size is normal. Tricuspid regurgitation signal is inadequate for assessing  PA pressure.   3. The mitral valve is normal in structure. No evidence of mitral valve  regurgitation. No evidence of mitral stenosis.   4. The aortic valve is tricuspid. Aortic valve regurgitation is not  visualized. No aortic stenosis is present.   5. Technically difficult study with poor acoustic windows.     Neuro/Psych   Anxiety Depression       GI/Hepatic ,GERD  Medicated and Controlled,,S/p PEG tube placement   Endo/Other  Hypothyroidism    Renal/GU Renal InsufficiencyRenal disease     Musculoskeletal  (+) Arthritis ,    Abdominal   Peds  Hematology  (+) Blood dyscrasia, anemia   Anesthesia Other Findings Tonsillar Cancer  s/p XRT  Reproductive/Obstetrics                              Anesthesia Physical Anesthesia Plan  ASA: 3  Anesthesia Plan: General   Post-op Pain Management:    Induction: Intravenous and Rapid sequence  PONV Risk Score and Plan: 1 and Treatment may vary due to age or medical condition, Ondansetron  and Dexamethasone   Airway Management Planned: Oral ETT  Additional Equipment: None  Intra-op Plan:   Post-operative Plan: Extubation in OR  Informed Consent:      Dental advisory given  Plan Discussed with: CRNA  Anesthesia Plan Comments: (Actively vomiting in preoperative bay.)         Anesthesia Quick Evaluation

## 2024-03-27 ENCOUNTER — Telehealth: Payer: Self-pay | Admitting: Internal Medicine

## 2024-03-27 NOTE — Telephone Encounter (Signed)
 Patient would like to switch providers from Dr. Okey to Dr. Floretta. Please advise.

## 2024-03-28 LAB — SURGICAL PATHOLOGY

## 2024-03-29 ENCOUNTER — Encounter (HOSPITAL_COMMUNITY): Payer: Self-pay | Admitting: Gastroenterology

## 2024-04-02 ENCOUNTER — Ambulatory Visit: Admitting: Gastroenterology

## 2024-04-02 ENCOUNTER — Ambulatory Visit: Payer: Self-pay | Admitting: Gastroenterology

## 2024-04-08 ENCOUNTER — Telehealth: Payer: Self-pay | Admitting: Pharmacy Technician

## 2024-04-08 ENCOUNTER — Other Ambulatory Visit (HOSPITAL_COMMUNITY): Payer: Self-pay

## 2024-04-08 ENCOUNTER — Other Ambulatory Visit: Payer: Self-pay | Admitting: Hematology and Oncology

## 2024-04-08 NOTE — Telephone Encounter (Signed)
 Pharmacy Patient Advocate Encounter   Received notification from Patient Pharmacy- [facility] that prior authorization for Farxiga  10 MG is required/requested.   Insurance verification completed.   The patient is insured through [facility] MEDICAID.   Per test claim: PA required; PA submitted to above mentioned insurance via Latent Key/confirmation #/[id number] Status is pending

## 2024-04-09 ENCOUNTER — Other Ambulatory Visit (HOSPITAL_COMMUNITY): Payer: Self-pay

## 2024-04-10 ENCOUNTER — Other Ambulatory Visit: Payer: Self-pay | Admitting: Hematology and Oncology

## 2024-04-10 ENCOUNTER — Other Ambulatory Visit (HOSPITAL_COMMUNITY): Payer: Self-pay

## 2024-04-10 ENCOUNTER — Encounter (HOSPITAL_COMMUNITY): Payer: Self-pay

## 2024-04-12 ENCOUNTER — Telehealth: Payer: Self-pay

## 2024-04-12 ENCOUNTER — Other Ambulatory Visit: Payer: Self-pay | Admitting: Oncology

## 2024-04-12 ENCOUNTER — Encounter (HOSPITAL_COMMUNITY): Payer: Self-pay

## 2024-04-12 ENCOUNTER — Other Ambulatory Visit (HOSPITAL_COMMUNITY): Payer: Self-pay

## 2024-04-12 MED ORDER — OXYCODONE HCL 15 MG PO TABS
15.0000 mg | ORAL_TABLET | Freq: Four times a day (QID) | ORAL | 0 refills | Status: DC | PRN
  Filled 2024-04-12: qty 90, 23d supply, fill #0

## 2024-04-12 NOTE — Telephone Encounter (Signed)
 Telephone Encounter - 08:57   Patient called requesting a refill of oxycodone  (Roxicodone ) 15 mg immediate-release tablets. Request forwarded to and addressed by the on-call provider, who sent the prescription to [facility].

## 2024-04-15 ENCOUNTER — Other Ambulatory Visit: Payer: Self-pay

## 2024-04-15 ENCOUNTER — Other Ambulatory Visit (HOSPITAL_COMMUNITY): Payer: Self-pay

## 2024-04-15 ENCOUNTER — Other Ambulatory Visit: Payer: Self-pay | Admitting: Gastroenterology

## 2024-04-15 MED ORDER — PANTOPRAZOLE SODIUM 40 MG PO TBEC
40.0000 mg | DELAYED_RELEASE_TABLET | Freq: Two times a day (BID) | ORAL | 1 refills | Status: AC
  Filled 2024-04-15 (×2): qty 180, 90d supply, fill #0
  Filled 2024-04-22: qty 60, 30d supply, fill #0
  Filled 2024-05-20: qty 60, 30d supply, fill #1

## 2024-04-15 NOTE — Telephone Encounter (Signed)
 LVM for wife at [phone] regarding her husband medication if it was discontinued or not

## 2024-04-15 NOTE — Telephone Encounter (Signed)
 Wife returning call to discuss medication.  Please advise thank you.

## 2024-04-16 ENCOUNTER — Ambulatory Visit (HOSPITAL_COMMUNITY)
Admission: RE | Admit: 2024-04-16 | Discharge: 2024-04-16 | Disposition: A | Discharge status: Home / Self Care | Source: Ambulatory Visit | Attending: Family | Admitting: Family

## 2024-04-16 ENCOUNTER — Other Ambulatory Visit (HOSPITAL_COMMUNITY): Payer: Self-pay

## 2024-04-16 ENCOUNTER — Ambulatory Visit: Payer: Self-pay | Admitting: Gastroenterology

## 2024-04-16 DIAGNOSIS — R1312 Dysphagia, oropharyngeal phase: Secondary | ICD-10-CM | POA: Insufficient documentation

## 2024-04-16 DIAGNOSIS — R1310 Dysphagia, unspecified: Secondary | ICD-10-CM

## 2024-04-16 NOTE — Therapy (Signed)
 Modified Barium Swallow Study  Patient Details  Name: Mason [name] MRN: [id number] Date of Birth: [birth year]  Today's Date: 04/16/2024  Modified Barium Swallow completed.  Full report located under Chart Review in the Imaging Section.  History of Present Illness Mason [name] is a 58 y.o. male who presented to this OP MBS as referred by GI secondary to his report of progressively worsening dysphagia over the past one year. Esophagram in 2019 reported trace, silent aspiration, retrograde reflux of contrast from oropharynx into posterior nasopharynx. He had an EGD with dilation 2019 with improvement. He underwent EGD with dilation on 03/26/24 with patient indicating improvement in swallowing. Per Patient, PEG was removed a year and a half ago. He did endorse a 40lb weight loss in the past 4 months. He has not been seen by SLP prior to this OP MBS. PMH: tonsilar cancer diagnosed 2015, s/p chemotherapy, IMRT TomoTherapy, PEG placement, recurrence in 2016 treated with chemotherapy. In addition, he has PMH of CAD, CKD-3, chemotherapy induced neuropathy.   Clinical Impression  SLP recommends referral to OP SLP for dysphagia  Mason [name] presents with a mild oropharyngeal dysphagia as per this MBS. Flash penetration (PAS 2) occured with thin liquids but no aspiration and no penetration or aspiration with any of the other tested barium consistencies. Instances of escape of barium into nasopharynx was observed but no nasal regurgitation and no barium residuals in nasopharynx.. His difficulty with orally and pharyngeally transiting boluses increased steadily with increased viscocity with residuals in pharynx increasing from minimal with thin and nectar thick liquids, to moderate with honey thick, puree and solid. Pharyngeal residuals were diffuse (vallecular sinus, pyrifrom sinus aryepiglottic folds and posterior pharyngeal wall but did clear with subsequent swallows and sips of thin liquid barium  and/or water . Towards end of study, with puree consistency, he exhibited retrograde movement of barium up through PES and into pharynx, causing him to gag.  DIGEST Swallow Severity Rating*  Safety: 0  Efficiency:1  Overall Pharyngeal Swallow Severity: 1 1: mild; 2: moderate; 3: severe; 4: profound  *The Dynamic Imaging Grade of Swallowing Toxicity is standardized for the head and neck cancer population, however, demonstrates promising clinical applications across populations to standardize the clinical rating of pharyngeal swallow safety and severity.  Swallow Evaluation Recommendations Recommendations: PO diet Liquid Administration via: Cup;Straw Supervision: Patient able to self-feed Swallowing strategies  : Slow rate;Small bites/sips;Follow solids with liquids Postural changes: Position pt fully upright for meals;Stay upright 30-60 min after meals Oral care recommendations: Oral care BID (2x/day)    Mason IVAR Blase, MA, CCC-SLP Speech Therapy  04/16/2024,3:26 PM

## 2024-04-19 ENCOUNTER — Telehealth: Payer: Self-pay | Admitting: Internal Medicine

## 2024-04-19 ENCOUNTER — Encounter (HOSPITAL_COMMUNITY): Payer: Self-pay

## 2024-04-19 ENCOUNTER — Other Ambulatory Visit (HOSPITAL_COMMUNITY): Payer: Self-pay

## 2024-04-19 NOTE — Telephone Encounter (Signed)
 Patient's wife called saying they received a letter from the insurance company saying they are denying the prior auth for this medication.  She is concerned that he will be without his medication. Patient is out medication.

## 2024-04-19 NOTE — Telephone Encounter (Signed)
 I am not sure the patient has insurance. Team, can you see?

## 2024-04-19 NOTE — Telephone Encounter (Signed)
 Per WAM, patient has [place] Medicaid.

## 2024-04-19 NOTE — Telephone Encounter (Signed)
 Pt c/o medication issue:  1. Name of Medication: dapagliflozin  propanediol (FARXIGA ) 10 MG TABS tablet   2. How are you currently taking this medication (dosage and times per day)?   3. Are you having a reaction (difficulty breathing--STAT)?   4. What is your medication issue? Patient wife called stating they received a letter from [employer] stating they are denying the prior auth for this medication.  She is concern because he is currently out of this medication.  She wants to know if there is anything she should be aware of since he is currently not taking the medication.  Please advise.

## 2024-04-19 NOTE — Telephone Encounter (Signed)
 Patient's wife, Rumalda, states patient received a letter from his insurance stating prior auth for Farxiga  was denied.  She states reasons noted in letter were that this was a trial medication, and none of the documentation submitted showed patient was improving on this medication, clinical goals were not shown to be met while taking this medication.  Patient has been out of Farxiga  for about 2 weeks now. Rose denies patient having any swelling or SOB. He has had a 2 lb weight gain from yesterday, but he is trying to eat more after losing about 40 lbs due to dysphagia.  Will forward to Dr. Okey, Pharm D, and Rx Med Assistance to review and see if anything more can be done to keep patient on Farxiga  or if there is an alternative medication he could be prescribed.

## 2024-04-19 NOTE — Telephone Encounter (Signed)
 Resent chart notes to confirm

## 2024-04-21 ENCOUNTER — Other Ambulatory Visit (HOSPITAL_COMMUNITY): Payer: Self-pay

## 2024-04-21 ENCOUNTER — Other Ambulatory Visit: Payer: Self-pay

## 2024-04-22 ENCOUNTER — Telehealth: Payer: Self-pay

## 2024-04-22 ENCOUNTER — Other Ambulatory Visit (HOSPITAL_COMMUNITY): Payer: Self-pay

## 2024-04-22 NOTE — Telephone Encounter (Signed)
 LVM for wife to call back to make an appointment for her husband to see Dr San in March.

## 2024-04-22 NOTE — Telephone Encounter (Signed)
 Approved- waiting on fax confirmation

## 2024-04-23 NOTE — Telephone Encounter (Signed)
 Scheduled 06-25-24 with wife

## 2024-04-25 ENCOUNTER — Inpatient Hospital Stay: Attending: Internal Medicine | Admitting: Hematology and Oncology

## 2024-04-25 ENCOUNTER — Other Ambulatory Visit: Payer: Self-pay

## 2024-04-25 ENCOUNTER — Inpatient Hospital Stay

## 2024-04-25 ENCOUNTER — Encounter: Payer: Self-pay | Admitting: Hematology and Oncology

## 2024-04-25 ENCOUNTER — Other Ambulatory Visit (HOSPITAL_COMMUNITY): Payer: Self-pay

## 2024-04-25 VITALS — BP 98/54 | HR 70 | Temp 99.0°F | Resp 18 | Ht 76.0 in | Wt 215.2 lb

## 2024-04-25 DIAGNOSIS — R252 Cramp and spasm: Secondary | ICD-10-CM | POA: Diagnosis not present

## 2024-04-25 DIAGNOSIS — Z79899 Other long term (current) drug therapy: Secondary | ICD-10-CM | POA: Diagnosis not present

## 2024-04-25 DIAGNOSIS — E538 Deficiency of other specified B group vitamins: Secondary | ICD-10-CM

## 2024-04-25 DIAGNOSIS — G620 Drug-induced polyneuropathy: Secondary | ICD-10-CM | POA: Insufficient documentation

## 2024-04-25 DIAGNOSIS — R1310 Dysphagia, unspecified: Secondary | ICD-10-CM | POA: Insufficient documentation

## 2024-04-25 DIAGNOSIS — M436 Torticollis: Secondary | ICD-10-CM | POA: Diagnosis not present

## 2024-04-25 DIAGNOSIS — I5022 Chronic systolic (congestive) heart failure: Secondary | ICD-10-CM | POA: Insufficient documentation

## 2024-04-25 DIAGNOSIS — G893 Neoplasm related pain (acute) (chronic): Secondary | ICD-10-CM | POA: Diagnosis not present

## 2024-04-25 DIAGNOSIS — T451X5A Adverse effect of antineoplastic and immunosuppressive drugs, initial encounter: Secondary | ICD-10-CM | POA: Diagnosis not present

## 2024-04-25 DIAGNOSIS — M542 Cervicalgia: Secondary | ICD-10-CM | POA: Insufficient documentation

## 2024-04-25 DIAGNOSIS — I959 Hypotension, unspecified: Secondary | ICD-10-CM | POA: Diagnosis not present

## 2024-04-25 DIAGNOSIS — C099 Malignant neoplasm of tonsil, unspecified: Secondary | ICD-10-CM | POA: Insufficient documentation

## 2024-04-25 DIAGNOSIS — E039 Hypothyroidism, unspecified: Secondary | ICD-10-CM

## 2024-04-25 LAB — CBC WITH DIFFERENTIAL/PLATELET
Abs Immature Granulocytes: 0.04 K/uL (ref 0.00–0.07)
Basophils Absolute: 0.1 K/uL (ref 0.0–0.1)
Basophils Relative: 1 %
Eosinophils Absolute: 0.1 K/uL (ref 0.0–0.5)
Eosinophils Relative: 1 %
HCT: 44.7 % (ref 39.0–52.0)
Hemoglobin: 14.2 g/dL (ref 13.0–17.0)
Immature Granulocytes: 0 %
Lymphocytes Relative: 9 %
Lymphs Abs: 1 K/uL (ref 0.7–4.0)
MCH: 28.5 pg (ref 26.0–34.0)
MCHC: 31.8 g/dL (ref 30.0–36.0)
MCV: 89.6 fL (ref 80.0–100.0)
Monocytes Absolute: 0.7 K/uL (ref 0.1–1.0)
Monocytes Relative: 7 %
Neutro Abs: 9.2 K/uL — ABNORMAL HIGH (ref 1.7–7.7)
Neutrophils Relative %: 82 %
Platelets: 221 K/uL (ref 150–400)
RBC: 4.99 MIL/uL (ref 4.22–5.81)
RDW: 13.5 % (ref 11.5–15.5)
WBC: 11.2 K/uL — ABNORMAL HIGH (ref 4.0–10.5)
nRBC: 0 % (ref 0.0–0.2)

## 2024-04-25 LAB — COMPREHENSIVE METABOLIC PANEL WITH GFR
ALT: 9 U/L (ref 0–44)
AST: 19 U/L (ref 15–41)
Albumin: 3.8 g/dL (ref 3.5–5.0)
Alkaline Phosphatase: 82 U/L (ref 38–126)
Anion gap: 6 (ref 5–15)
BUN: 12 mg/dL (ref 6–20)
CO2: 29 mmol/L (ref 22–32)
Calcium: 9.3 mg/dL (ref 8.9–10.3)
Chloride: 103 mmol/L (ref 98–111)
Creatinine, Ser: 1.46 mg/dL — ABNORMAL HIGH (ref 0.61–1.24)
GFR, Estimated: 56 mL/min — ABNORMAL LOW
Glucose, Bld: 93 mg/dL (ref 70–99)
Potassium: 4.4 mmol/L (ref 3.5–5.1)
Sodium: 138 mmol/L (ref 135–145)
Total Bilirubin: 0.4 mg/dL (ref 0.0–1.2)
Total Protein: 6.8 g/dL (ref 6.5–8.1)

## 2024-04-25 LAB — TSH: TSH: 0.636 u[IU]/mL (ref 0.350–4.500)

## 2024-04-25 NOTE — Assessment & Plan Note (Addendum)
 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

## 2024-04-25 NOTE — Assessment & Plan Note (Addendum)
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

## 2024-04-25 NOTE — Assessment & Plan Note (Addendum)
He has chronic pain syndrome related to prior surgery and radiation I refilled his prescription oxycodone recently

## 2024-04-25 NOTE — Progress Notes (Signed)
 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 He would like to see ENT physician to consider esophageal dilation He has modified barium swallow and additional test scheduled to see his ENT physician at [facility] Neck stiffness He has limited range of movement due to fibrosis from prior radiation We discussed importance of daily regular neck exercises Cancer associated pain He has chronic pain syndrome related to prior surgery and radiation I refilled his prescription oxycodone  recently Chronic systolic congestive heart failure (HCC) He is known to have cardiomyopathy His last echocardiogram confirmed reduced ejection fraction He is currently on medical management He will follow-up with cardiology clinic for medication adjustment including blood pressure management next month  No orders of the defined types were placed in this encounter.    Hicks Lonn,  MD  INTERVAL HISTORY: he returns for surveillance follow-up for head and neck cancer He continues to have chronic neck pain and spasm He is undergoing evaluation for swallowing difficulties with ear nose and throat physician His blood pressure is low but he is not symptomatic He also have appointment to see cardiologist for medication adjustment  PHYSICAL EXAMINATION: ECOG PERFORMANCE STATUS: 1 - Symptomatic but completely ambulatory  Vitals:   04/25/24 1225  BP: (!) 98/54  Pulse: 70  Resp: 18  Temp: 99 F (37.2 C)  SpO2: 100%   Filed Weights   04/25/24 1225  Weight: 215 lb 3.2 oz (97.6 kg)   Physical examination of his neck revealed neck fibrosis with limited range of motion.  No signs of cancer recurrence Relevant data reviewed during this visit included CBC, CMP, TSH

## 2024-04-25 NOTE — Assessment & Plan Note (Addendum)
 He is known to have cardiomyopathy His last echocardiogram confirmed reduced ejection fraction He is currently on medical management He will follow-up with cardiology clinic for medication adjustment including blood pressure management next month

## 2024-04-30 ENCOUNTER — Inpatient Hospital Stay: Admitting: Internal Medicine

## 2024-04-30 ENCOUNTER — Other Ambulatory Visit (HOSPITAL_COMMUNITY): Payer: Self-pay

## 2024-04-30 DIAGNOSIS — G620 Drug-induced polyneuropathy: Secondary | ICD-10-CM

## 2024-04-30 DIAGNOSIS — T451X5A Adverse effect of antineoplastic and immunosuppressive drugs, initial encounter: Secondary | ICD-10-CM

## 2024-04-30 MED ORDER — PREGABALIN 100 MG PO CAPS
100.0000 mg | ORAL_CAPSULE | Freq: Two times a day (BID) | ORAL | 2 refills | Status: AC
  Filled 2024-04-30: qty 60, 30d supply, fill #0
  Filled 2024-05-24: qty 60, 30d supply, fill #1

## 2024-04-30 NOTE — Progress Notes (Signed)
 I connected with Mason [name] on 04/30/2024 at  9:30 AM EST by telephone visit and verified that I am speaking with the correct person using two identifiers.  I discussed the limitations, risks, security and privacy concerns of performing an evaluation and management service by telemedicine and the availability of in-person appointments. I also discussed with the patient that there may be a patient responsible charge related to this service. The patient expressed understanding and agreed to proceed.   Other persons participating in the visit and their role in the encounter:  n/a   Patient's location:  Home Provider's location:  Office Chief Complaint:  Chemotherapy-induced neuropathy  History of Present Ilness: Mason [name] reports modest improvement with the Lyrica  50mg .  He has not been dosing it consistently.  Does describe more prominent coldness sensation in hands and feet.  Continues on observation with Dr. Lonn.    Observations: Language and cognition at baseline  Assessment and Plan: Chemotherapy-induced neuropathy  Clinically modestly improved with Lyrica  after refractory to gabapentin .  Discussed increasing dose to 100mg  daily or BID, he is agreeable with this.  Follow Up Instructions: We ask that Mason [name] return to clinic in 2 months for further medication adjustments, or sooner as needed.  I discussed the assessment and treatment plan with the patient.  The patient was provided an opportunity to ask questions and all were answered.  The patient agreed with the plan and demonstrated understanding of the instructions.    The patient was advised to call back or seek an in-person evaluation if the symptoms worsen or if the condition fails to improve as anticipated.    Daelen Belvedere K Artha Stavros, MD   I provided 20 minutes of non face-to-face telephone visit time during this encounter, and > 50% was spent counseling as documented under my assessment & plan.

## 2024-05-01 ENCOUNTER — Other Ambulatory Visit: Payer: Self-pay | Admitting: Internal Medicine

## 2024-05-02 ENCOUNTER — Other Ambulatory Visit: Payer: Self-pay | Admitting: Oncology

## 2024-05-02 ENCOUNTER — Telehealth: Payer: Self-pay | Admitting: Internal Medicine

## 2024-05-02 ENCOUNTER — Other Ambulatory Visit (HOSPITAL_COMMUNITY): Payer: Self-pay

## 2024-05-02 ENCOUNTER — Other Ambulatory Visit: Payer: Self-pay

## 2024-05-02 MED ORDER — SIMVASTATIN 20 MG PO TABS
20.0000 mg | ORAL_TABLET | Freq: Every day | ORAL | 2 refills | Status: AC
  Filled 2024-05-02: qty 90, 90d supply, fill #0

## 2024-05-02 NOTE — Telephone Encounter (Signed)
 In accordance with refill protocols, please review and address the following requirements before this medication refill can be authorized:  Labs

## 2024-05-02 NOTE — Telephone Encounter (Signed)
 Scheduled patient for telephone visit in 3 months. Called to let the patient know, his voicemail box is not set up. I am mailing the appointment reminder.

## 2024-05-03 ENCOUNTER — Other Ambulatory Visit: Payer: Self-pay

## 2024-05-03 MED ORDER — OXYCODONE HCL 15 MG PO TABS
15.0000 mg | ORAL_TABLET | Freq: Four times a day (QID) | ORAL | 0 refills | Status: DC | PRN
  Filled 2024-05-03: qty 90, 23d supply, fill #0

## 2024-05-07 ENCOUNTER — Ambulatory Visit: Attending: Physician Assistant

## 2024-05-07 VITALS — BP 102/60 | HR 89 | Ht 78.0 in | Wt 223.0 lb

## 2024-05-07 DIAGNOSIS — I2510 Atherosclerotic heart disease of native coronary artery without angina pectoris: Secondary | ICD-10-CM | POA: Insufficient documentation

## 2024-05-07 DIAGNOSIS — I10 Essential (primary) hypertension: Secondary | ICD-10-CM | POA: Diagnosis not present

## 2024-05-07 DIAGNOSIS — E782 Mixed hyperlipidemia: Secondary | ICD-10-CM | POA: Insufficient documentation

## 2024-05-07 DIAGNOSIS — I5022 Chronic systolic (congestive) heart failure: Secondary | ICD-10-CM | POA: Diagnosis not present

## 2024-05-07 NOTE — Progress Notes (Signed)
 " Cardiology Office Note   Date: 05/07/2024  ID:  Mason [name] 1965/11/09 [id number] PCP: Mason Sharper, FNP  [facility] Providers Cardiologist: Mason Archer, MD     Chief Complaint: Mason [name] is a 59 y.o.male with PMH of non-obstructive CAD on coronary CTA 11/17/2022, HFrEF with LVEF as low as 15% on echo 10/2017 and improved to 55-60% on echo 11/03/22, hypertension, tonsillar cancer, COPD, CKD stage III who presents to the clinic for overdue follow-up.    Mason [name] established care in 2019 after being hospitalized with heart failure in the setting of chemotherapy for tonsillar cancer. Echo 10/2017 showed LVEF 15% and moderate RV dysfunction. Chest CT negative for PE. Troponins flat, EKG without ischemic changes. He was placed on HF GDMT. Repeat echo 05/2018 showed LVEF improved to 35-40%.  Seen in clinic 06/2018 with atypical chest pain. Troponin negative. Felt that his symptoms were musculoskeletal in nature. Renal function apparently limited GDMT. Repeat echo 09/2018 LVEF back down to 20-25%, referred to EP for evaluation for possible ICD but this was deferred.  He was followed by [name] which being incarcerated for a period of time, echo 11/2021 showed LVEF 35-40%. He presented back to the clinic 12/2021 and was hypertensive. Referred to hypertension clinic pharmD, HF GDMT up-titrated. Seen in clinic 09/2022 for surgical clearance. Echo 11/03/2022 showed LVEF improved to 55-60%. Stress test 11/08/2022 intermediate risk with evidence of infarction. Coronary CTA 11/17/2022 showed calcium score 145 (93rd percentile), minimal non-obstructive CAD in the proximal RCA and LCx. He was started on simvastatin . He has done well from a cardiac standpoint since this.   His wife contacted the office regarding his desire to discontinue some medications given that his home BP readings were low.    History of Present Illness: Today he is doing remarkably well from a cardiac  standpoint. Tells me that his only complaint is neuropathy in his legs, but it is manageable. He is inquiring about possibly discontinuing medications due to the number that he is required to take now. Regarding HF symptoms, he tells me he can run up a flight of stairs if he needs to. He is not checking his BP or weights at home. He takes his medications as prescribed.  ROS: Denies chest pain, shortness of breath, orthopnea, PND, lower extremity edema, palpitations, lightheadedness, dizziness, syncope, abnormal bleeding.   Studies Reviewed: The following studies were reviewed today: Cardiac Studies & Procedures   ______________________________________________________________________________________________   STRESS TESTS  MYOCARDIAL PERFUSION IMAGING 11/08/2022  Interpretation Summary   Findings are consistent with infarction. The study is intermediate risk.   No ST deviation was noted.   LV perfusion is abnormal. There is no evidence of ischemia. There is evidence of infarction. Defect 1: There is a medium defect with mild reduction in uptake present in the apical to mid inferior location(s) that is fixed. There is abnormal wall motion in the defect area. Consistent with infarction.   Left ventricular function is abnormal. Global function is mildly reduced. Nuclear stress EF: 41%. End diastolic cavity size is normal. End systolic cavity size is normal. No evidence of transient ischemic dilation (TID) noted.   Prior study not available for comparison.   ECHOCARDIOGRAM  ECHOCARDIOGRAM COMPLETE 11/03/2022  Narrative ECHOCARDIOGRAM REPORT    Patient Name:   Mason [name] Date of Exam: 11/03/2022 Medical Rec #:  [id number]         Height:       77.0 in Accession #:  [id number]        Weight:       264.7 lb Date of Birth:  01-Nov-1965         BSA:          2.520 m Patient Age:    57 years          BP:           104/70 mmHg Patient Gender: M                 HR:           96 bpm. Exam  Location:  [address]  Procedure: 2D Echo, Cardiac Doppler, Color Doppler and Intracardiac Opacification Agent  Indications:     I50.22 CHF  History:         Patient has prior history of Echocardiogram examinations, most recent 10/10/2018. CHF, Signs/Symptoms:Edema; Risk Factors:Family History of Coronary Artery Disease, Former Smoker and Hypertension. 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  Sonographer:     Mason [name] RDCS Referring Phys:  Mason [name] Diagnosing Phys: Mason McleanMD  IMPRESSIONS   1. Left ventricular ejection fraction, by estimation, is 55 to 60%. The left ventricle has normal function. The left ventricle has no regional wall motion abnormalities. There is mild concentric left ventricular hypertrophy. Left ventricular diastolic parameters are consistent with Grade I diastolic dysfunction (impaired relaxation). 2. Right ventricular systolic function is normal. The right ventricular size is normal. Tricuspid regurgitation signal is inadequate for assessing PA pressure. 3. The mitral valve is normal in structure. No evidence of mitral valve regurgitation. No evidence of mitral stenosis. 4. The aortic valve is tricuspid. Aortic valve regurgitation is not visualized. No aortic stenosis is present. 5. Technically difficult study with poor acoustic windows.  FINDINGS Left Ventricle: Left ventricular ejection fraction, by estimation, is 55 to 60%. The left ventricle has normal function. The left ventricle has no regional wall motion abnormalities. The left ventricular internal cavity size was normal in size. There is mild concentric left ventricular hypertrophy. Left ventricular diastolic parameters are consistent with Grade I diastolic dysfunction (impaired relaxation).  Right Ventricle: The right ventricular size is normal. No increase in right ventricular wall thickness.  Right ventricular systolic function is normal. Tricuspid regurgitation signal is inadequate for assessing PA pressure.  Left Atrium: Left atrial size was normal in size.  Right Atrium: Right atrial size was normal in size.  Pericardium: There is no evidence of pericardial effusion.  Mitral Valve: The mitral valve is normal in structure. No evidence of mitral valve regurgitation. No evidence of mitral valve stenosis.  Tricuspid Valve: The tricuspid valve is normal in structure. Tricuspid valve regurgitation is not demonstrated.  Aortic Valve: The aortic valve is tricuspid. Aortic valve regurgitation is not visualized. No aortic stenosis is present.  Pulmonic Valve: The pulmonic valve was normal in structure. Pulmonic valve regurgitation is not visualized.  Aorta: The aortic root is normal in size and structure.  Venous: The inferior vena cava was not well visualized.  IAS/Shunts: No atrial level shunt detected by color flow Doppler.   LEFT VENTRICLE PLAX 2D LVIDd:         4.60 cm   Diastology LVIDs:         2.60 cm   LV e' medial:    6.64 cm/s LV PW:         1.00 cm   LV E/e' medial:  7.3 LV IVS:  0.80 cm   LV e' lateral:   10.10 cm/s LVOT diam:     2.70 cm   LV E/e' lateral: 4.8 LV SV:         68 LV SV Index:   27 LVOT Area:     5.73 cm   RIGHT VENTRICLE RV Basal diam:  3.00 cm RV S prime:     13.10 cm/s TAPSE (M-mode): 2.5 cm  LEFT ATRIUM             Index        RIGHT ATRIUM           Index LA diam:        3.40 cm 1.35 cm/m   RA Pressure: 3.00 mmHg LA Vol (A2C):   40.2 ml 15.95 ml/m  RA Area:     15.00 cm LA Vol (A4C):   34.2 ml 13.57 ml/m  RA Volume:   40.70 ml  16.15 ml/m LA Biplane Vol: 37.7 ml 14.96 ml/m AORTIC VALVE LVOT Vmax:   66.55 cm/s LVOT Vmean:  43.750 cm/s LVOT VTI:    0.119 m  AORTA Ao Root diam: 3.50 cm Ao Asc diam:  2.90 cm  MITRAL VALVE               TRICUSPID VALVE MV Area (PHT)  cm         Estimated RAP:  3.00 mmHg MV Decel  Time: 194 msec MV E velocity: 48.75 cm/s  SHUNTS MV A velocity: 81.35 cm/s  Systemic VTI:  0.12 m MV E/A ratio:  0.60        Systemic Diam: 2.70 cm  Dalton McleanMD Electronically signed by Mason Kanner Signature Date/Time: 11/03/2022/3:13:30 PM    Final (Updated)      CT SCANS  CT CORONARY MORPH W/CTA COR W/SCORE 11/17/2022  Addendum 11/24/2022 12:36 AM ADDENDUM REPORT: 11/24/2022 00:33  EXAM: OVER-READ INTERPRETATION  CT CHEST  The following report is an over-read performed by radiologist Dr. Suzen Dials of [facility] on 11/24/2022. This over-read does not include interpretation of cardiac or coronary anatomy or pathology. The coronary calcium score/coronary CTA interpretation by the cardiologist is attached.  COMPARISON:  January 18, 2022  FINDINGS: Cardiovascular: There are no significant extracardiac vascular findings.  Mediastinum/Nodes: There are no enlarged lymph nodes within the visualized mediastinum.  Lungs/Pleura: There is no pleural effusion. The visualized lungs appear clear.  Upper abdomen: No significant findings in the visualized upper abdomen.  Musculoskeletal/Chest wall: No chest wall mass or suspicious osseous findings within the visualized chest.  IMPRESSION: No significant extracardiac findings within the visualized chest.   Electronically Signed By: Suzen Dials M.D. On: 11/24/2022 00:33  Narrative HISTORY: 59 yo male with chest pain, nonspecific  EXAM: Cardiac/Coronary CTA  TECHNIQUE: The patient was scanned on a [employer].  PROTOCOL: A 120 kV prospective scan was triggered in the descending thoracic aorta at 111 HU's. Axial non-contrast 3 mm slices were carried out through the heart. The data set was analyzed on a dedicated work station and scored using the Agatson method. Gantry rotation speed was 250 msecs and collimation was .6 mm. Beta blockade and 0.8 mg of sl NTG was given. The 3D  data set was reconstructed in 5% intervals of the 35-75 % of the R-R cycle. Diastolic phases were analyzed on a dedicated work station using MPR, MIP and VRT modes. The patient received 95mL OMNIPAQUE  IOHEXOL  350 MG/ML SOLN contrast.  FINDINGS: Quality: Fair, HR 71,  mis-registration artifact  Coronary calcium score: The patient's coronary artery calcium score is 145, which places the patient in the 93rd percentile.  Coronary arteries: Normal coronary origins.  Right dominance.  Right Coronary Artery: Dominant. Minimal mixed 1-24% proximal stenosis. Normal R-PDA and R-PLB branches.  Left Main Coronary Artery: Short vessel. Immediately bifurcates into the LAD and LCx arteries.  Left Anterior Descending Coronary Artery: Anterior artery which does not appear to reach the apex. No disease. 2 diagonal branches, no disease.  Left Circumflex Artery: AV groove LCX vessel with minimal mixed proximal and mid-vessel disease (1-24%). Small high OM1 branch without disease. Larger OM2 branch without disease.  Aorta: Normal size, 27 mm at the mid ascending aorta (level of the PA bifurcation) measured double oblique. No calcifications. No dissection.  Aortic Valve: Trileaflet. No calcifications.  Other findings:  Normal pulmonary vein drainage into the left atrium.  Normal left atrial appendage without a thrombus.  Normal size of the pulmonary artery.  IMPRESSION: 1. Minimal mixed non-obstructive CAD, CADRADS = 1.  2. Coronary artery calcium score is 145, which places the patient in the 93rd percentile for age/race and sex-matched control.  3. Normal coronary origin with right dominance.  4. Given age-advanced CAD, would recommend aggressive cardiovascular risk factor modification.  Electronically Signed: By: Vinie JAYSON Maxcy M.D. On: 11/17/2022 13:36     ______________________________________________________________________________________________                          Physical Exam: VS: BP 102/60   Pulse 89   Ht 6' 6 (1.981 m)   Wt 223 lb (101.2 kg)   SpO2 97%   BMI 25.77 kg/m   GEN: Well nourished, in NAD HEENT: Normal NECK: No JVD CARDIAC: RRR, no murmurs, rubs, gallops RESPIRATORY: Clear to auscultation bilaterally  ABDOMEN: Soft, non-tender, non-distended MUSCULOSKELETAL: No edema, no deformity  SKIN: Warm and dry NEUROLOGIC:  Alert and oriented x 3 PSYCHIATRIC:  Normal affect   Assessment & Plan: 1. HFrEF: LVEF as low as 15% on echo 10/2017 in the setting of chemotherapy for tonsillar cancer. Last echo 10/2022 LVEF improved to 55-60%. NYHA class I. Appears euvolemic on exam today. We discussed the importance of continuing HF GDMT and he is agreeable. - Continue Farxiga  10 mg daily - Continue Toprol -XL 100 mg daily - Continue Entresto  49/51 mg twice daily - Continue spironolactone  12.5 mg daily  2. Mild non-obstructive CAD: Noted non-obstructive disease to the prox RCA and LCx on coronary CTA 11/17/2022. EKG technically difficult, but it does not appear to have ischemic changes. He denies anginal symptoms today.  - Continue aspirin  81 mg daily - Continue Toprol -XL 100 mg daily - Continue simvastatin  20 mg daily - Continue as needed SL nitroglycerin   3. Hypertension: BP today 102/60. He is not checking it at home. He denies dizziness, lightheadedness, syncope. - Treatment in the setting of HF as above - With BP lower end of normal today and patient request to discontinue a medication, will stop Imdur  30 mg daily - consider increasing spironolactone  to 25 mg daily if he becomes hypertensive   4. Hyperlipidemia, goal LDL < 70: Last lipid panel 01/09/2023, LDL 122, HDL 71, TGs 154, total 220. He is not fasting today. - Check fasting lipid panel in the next couple of weeks - Continue simvastatin  20 mg daily   Dispo: Follow-up in 1 year with me or Dr. Floretta.  Signed, Saddie GORMAN Cleaves, NP 05/07/2024 4:00 PM [facility] "

## 2024-05-07 NOTE — Patient Instructions (Addendum)
 Medication Instructions:  DISCONTINUE IMDUR   *If you need a refill on your cardiac medications before your next appointment, please call your pharmacy*  Lab Work: RETURN FOR FASTING LAB IN 2 WEEKS..............SABRA LIPID PANEL If you have labs (blood work) drawn today and your tests are completely normal, you will receive your results only by: MyChart Message (if you have MyChart) OR A paper copy in the mail If you have any lab test that is abnormal or we need to change your treatment, we will call you to review the results.  Testing/Procedures: No procedures were ordered during today's visit.   Follow-Up: At [facility], you and your health needs are our priority.  As part of our continuing mission to provide you with exceptional heart care, our providers are all part of one team.  This team includes your primary Cardiologist (physician) and Advanced Practice Providers or APPs (Physician Assistants and Nurse Practitioners) who all work together to provide you with the care you need, when you need it.  Your next appointment:   1 year(s)  Provider:   DR. SULLIVAN    We recommend signing up for the patient portal called MyChart.  Sign up information is provided on this After Visit Summary.  MyChart is used to connect with patients for Virtual Visits (Telemedicine).  Patients are able to view lab/test results, encounter notes, upcoming appointments, etc.  Non-urgent messages can be sent to your provider as well.   To learn more about what you can do with MyChart, go to [email or web address].   Other Instructions

## 2024-05-21 ENCOUNTER — Ambulatory Visit: Admitting: Emergency Medicine

## 2024-05-22 ENCOUNTER — Other Ambulatory Visit: Payer: Self-pay | Admitting: Hematology and Oncology

## 2024-05-22 ENCOUNTER — Other Ambulatory Visit: Payer: Self-pay

## 2024-05-24 ENCOUNTER — Other Ambulatory Visit (HOSPITAL_COMMUNITY): Payer: Self-pay

## 2024-05-24 MED ORDER — OXYCODONE HCL 15 MG PO TABS
15.0000 mg | ORAL_TABLET | Freq: Four times a day (QID) | ORAL | 0 refills | Status: AC | PRN
  Filled 2024-05-24: qty 90, 23d supply, fill #0
  Filled ????-??-??: fill #0

## 2024-06-25 ENCOUNTER — Ambulatory Visit: Admitting: Gastroenterology

## 2024-07-01 ENCOUNTER — Inpatient Hospital Stay: Attending: Internal Medicine | Admitting: Internal Medicine

## 2024-10-24 ENCOUNTER — Inpatient Hospital Stay: Attending: Internal Medicine

## 2024-10-24 ENCOUNTER — Inpatient Hospital Stay: Admitting: Hematology and Oncology
